# Patient Record
Sex: Male | Born: 1955
Health system: Southern US, Community
[De-identification: ages and names within clinical notes are randomized; demographics above are authoritative.]

## PROBLEM LIST (undated history)

## (undated) DIAGNOSIS — M5126 Other intervertebral disc displacement, lumbar region: Secondary | ICD-10-CM

## (undated) DIAGNOSIS — E119 Type 2 diabetes mellitus without complications: Secondary | ICD-10-CM

## (undated) DIAGNOSIS — K5790 Diverticulosis of intestine, part unspecified, without perforation or abscess without bleeding: Secondary | ICD-10-CM

## (undated) DIAGNOSIS — I10 Essential (primary) hypertension: Secondary | ICD-10-CM

## (undated) DIAGNOSIS — K219 Gastro-esophageal reflux disease without esophagitis: Secondary | ICD-10-CM

## (undated) DIAGNOSIS — M1990 Unspecified osteoarthritis, unspecified site: Secondary | ICD-10-CM

## (undated) DIAGNOSIS — J302 Other seasonal allergic rhinitis: Secondary | ICD-10-CM

## (undated) DIAGNOSIS — Z8639 Personal history of other endocrine, nutritional and metabolic disease: Secondary | ICD-10-CM

## (undated) DIAGNOSIS — E785 Hyperlipidemia, unspecified: Secondary | ICD-10-CM

## (undated) DIAGNOSIS — E039 Hypothyroidism, unspecified: Secondary | ICD-10-CM

## (undated) DIAGNOSIS — H9201 Otalgia, right ear: Secondary | ICD-10-CM

## (undated) DIAGNOSIS — M48061 Spinal stenosis, lumbar region without neurogenic claudication: Secondary | ICD-10-CM

## (undated) DIAGNOSIS — K635 Polyp of colon: Secondary | ICD-10-CM

## (undated) DIAGNOSIS — H6241 Otitis externa in other diseases classified elsewhere, right ear: Secondary | ICD-10-CM

## (undated) DIAGNOSIS — M51369 Other intervertebral disc degeneration, lumbar region without mention of lumbar back pain or lower extremity pain: Secondary | ICD-10-CM

## (undated) DIAGNOSIS — T7840XA Allergy, unspecified, initial encounter: Secondary | ICD-10-CM

## (undated) DIAGNOSIS — M779 Enthesopathy, unspecified: Secondary | ICD-10-CM

## (undated) DIAGNOSIS — I2510 Atherosclerotic heart disease of native coronary artery without angina pectoris: Secondary | ICD-10-CM

## (undated) DIAGNOSIS — M1712 Unilateral primary osteoarthritis, left knee: Secondary | ICD-10-CM

## (undated) DIAGNOSIS — R739 Hyperglycemia, unspecified: Secondary | ICD-10-CM

## (undated) DIAGNOSIS — B369 Superficial mycosis, unspecified: Secondary | ICD-10-CM

## (undated) DIAGNOSIS — I453 Trifascicular block: Secondary | ICD-10-CM

## (undated) DIAGNOSIS — I493 Ventricular premature depolarization: Secondary | ICD-10-CM

## (undated) DIAGNOSIS — R892 Abnormal level of other drugs, medicaments and biological substances in specimens from other organs, systems and tissues: Secondary | ICD-10-CM

## (undated) DIAGNOSIS — K7460 Unspecified cirrhosis of liver: Secondary | ICD-10-CM

## (undated) DIAGNOSIS — G4733 Obstructive sleep apnea (adult) (pediatric): Secondary | ICD-10-CM

## (undated) DIAGNOSIS — K2970 Gastritis, unspecified, without bleeding: Secondary | ICD-10-CM

## (undated) DIAGNOSIS — K5792 Diverticulitis of intestine, part unspecified, without perforation or abscess without bleeding: Secondary | ICD-10-CM

## (undated) DIAGNOSIS — K648 Other hemorrhoids: Secondary | ICD-10-CM

## (undated) DIAGNOSIS — D696 Thrombocytopenia, unspecified: Secondary | ICD-10-CM

## (undated) DIAGNOSIS — R161 Splenomegaly, not elsewhere classified: Secondary | ICD-10-CM

## (undated) DIAGNOSIS — H9319 Tinnitus, unspecified ear: Secondary | ICD-10-CM

## (undated) DIAGNOSIS — F419 Anxiety disorder, unspecified: Secondary | ICD-10-CM

## (undated) DIAGNOSIS — M5136 Other intervertebral disc degeneration, lumbar region: Secondary | ICD-10-CM

## (undated) DIAGNOSIS — Z9884 Bariatric surgery status: Secondary | ICD-10-CM

## (undated) DIAGNOSIS — H903 Sensorineural hearing loss, bilateral: Secondary | ICD-10-CM

## (undated) DIAGNOSIS — K760 Fatty (change of) liver, not elsewhere classified: Secondary | ICD-10-CM

## (undated) DIAGNOSIS — I5032 Chronic diastolic (congestive) heart failure: Secondary | ICD-10-CM

## (undated) HISTORY — DX: Unspecified osteoarthritis, unspecified site: M19.90

## (undated) HISTORY — DX: Abnormal level of other drugs, medicaments and biological substances in specimens from other organs, systems and tissues: R89.2

## (undated) HISTORY — PX: TRACHEOSTOMY: SUR1362

## (undated) HISTORY — PX: TONSILLECTOMY: SUR1361

## (undated) HISTORY — DX: Gastritis, unspecified, without bleeding: K29.70

## (undated) HISTORY — DX: Sensorineural hearing loss, bilateral: H90.3

## (undated) HISTORY — DX: Other seasonal allergic rhinitis: J30.2

## (undated) HISTORY — DX: Spinal stenosis, lumbar region without neurogenic claudication: M48.061

## (undated) HISTORY — DX: Other intervertebral disc degeneration, lumbar region: M51.36

## (undated) HISTORY — DX: Hypothyroidism, unspecified: E03.9

## (undated) HISTORY — DX: Unspecified cirrhosis of liver: K74.60

## (undated) HISTORY — DX: Diverticulosis of intestine, part unspecified, without perforation or abscess without bleeding: K57.90

## (undated) HISTORY — PX: TRACHEOSTOMY CLOSURE: SHX458

## (undated) HISTORY — DX: Hyperlipidemia, unspecified: E78.5

## (undated) HISTORY — DX: Enthesopathy, unspecified: M77.9

## (undated) HISTORY — DX: Tinnitus, unspecified ear: H93.19

## (undated) HISTORY — DX: Other hemorrhoids: K64.8

## (undated) HISTORY — DX: Anxiety disorder, unspecified: F41.9

## (undated) HISTORY — DX: Obstructive sleep apnea (adult) (pediatric): G47.33

## (undated) HISTORY — DX: Fatty (change of) liver, not elsewhere classified: K76.0

## (undated) HISTORY — DX: Otalgia, right ear: H92.01

## (undated) HISTORY — DX: Morbid (severe) obesity due to excess calories: E66.01

## (undated) HISTORY — DX: Otitis externa in other diseases classified elsewhere, right ear: H62.41

## (undated) HISTORY — DX: Chronic diastolic (congestive) heart failure: I50.32

## (undated) HISTORY — DX: Bariatric surgery status: Z98.84

## (undated) HISTORY — DX: Allergy, unspecified, initial encounter: T78.40XA

## (undated) HISTORY — DX: Other intervertebral disc degeneration, lumbar region without mention of lumbar back pain or lower extremity pain: M51.369

## (undated) HISTORY — DX: Personal history of other endocrine, nutritional and metabolic disease: Z86.39

## (undated) HISTORY — DX: Splenomegaly, not elsewhere classified: R16.1

## (undated) HISTORY — PX: EYE SURGERY: SHX253

## (undated) HISTORY — DX: Polyp of colon: K63.5

## (undated) HISTORY — DX: Diverticulitis of intestine, part unspecified, without perforation or abscess without bleeding: K57.92

## (undated) HISTORY — DX: Gastro-esophageal reflux disease without esophagitis: K21.9

## (undated) HISTORY — DX: Essential (primary) hypertension: I10

## (undated) HISTORY — DX: Other intervertebral disc displacement, lumbar region: M51.26

## (undated) HISTORY — DX: Superficial mycosis, unspecified: B36.9

---

## 1983-02-22 DIAGNOSIS — Z9884 Bariatric surgery status: Secondary | ICD-10-CM

## 1983-02-22 HISTORY — DX: Bariatric surgery status: Z98.84

## 1983-02-22 HISTORY — PX: ABDOMINAL SURGERY: SHX537

## 1983-02-22 HISTORY — PX: OTHER SURGICAL HISTORY: SHX169

## 1997-07-21 ENCOUNTER — Emergency Department (HOSPITAL_COMMUNITY): Admission: EM | Admit: 1997-07-21 | Discharge: 1997-07-21 | Payer: Self-pay | Admitting: Emergency Medicine

## 1997-08-01 ENCOUNTER — Emergency Department (HOSPITAL_COMMUNITY): Admission: EM | Admit: 1997-08-01 | Discharge: 1997-08-01 | Payer: Self-pay | Admitting: Emergency Medicine

## 1999-04-01 ENCOUNTER — Encounter: Admission: RE | Admit: 1999-04-01 | Discharge: 1999-04-01 | Payer: Self-pay | Admitting: Internal Medicine

## 1999-04-20 ENCOUNTER — Encounter: Admission: RE | Admit: 1999-04-20 | Discharge: 1999-04-20 | Payer: Self-pay | Admitting: Hematology and Oncology

## 1999-09-23 ENCOUNTER — Emergency Department (HOSPITAL_COMMUNITY): Admission: EM | Admit: 1999-09-23 | Discharge: 1999-09-23 | Payer: Self-pay | Admitting: Emergency Medicine

## 2001-08-10 ENCOUNTER — Encounter: Payer: Self-pay | Admitting: Emergency Medicine

## 2001-08-10 ENCOUNTER — Emergency Department (HOSPITAL_COMMUNITY): Admission: EM | Admit: 2001-08-10 | Discharge: 2001-08-10 | Payer: Self-pay | Admitting: Emergency Medicine

## 2003-02-22 HISTORY — PX: CHOLECYSTECTOMY: SHX55

## 2005-04-03 ENCOUNTER — Emergency Department (HOSPITAL_COMMUNITY): Admission: EM | Admit: 2005-04-03 | Discharge: 2005-04-03 | Payer: Self-pay | Admitting: Emergency Medicine

## 2006-02-21 DIAGNOSIS — K635 Polyp of colon: Secondary | ICD-10-CM

## 2006-02-21 HISTORY — DX: Polyp of colon: K63.5

## 2006-03-13 ENCOUNTER — Encounter (INDEPENDENT_AMBULATORY_CARE_PROVIDER_SITE_OTHER): Payer: Self-pay | Admitting: *Deleted

## 2006-03-13 ENCOUNTER — Emergency Department (HOSPITAL_COMMUNITY): Admission: EM | Admit: 2006-03-13 | Discharge: 2006-03-13 | Payer: Self-pay | Admitting: Emergency Medicine

## 2006-10-23 HISTORY — PX: COLONOSCOPY: SHX174

## 2007-03-10 ENCOUNTER — Emergency Department (HOSPITAL_COMMUNITY): Admission: EM | Admit: 2007-03-10 | Discharge: 2007-03-11 | Payer: Self-pay | Admitting: Emergency Medicine

## 2007-10-08 ENCOUNTER — Ambulatory Visit: Payer: Self-pay

## 2009-04-23 ENCOUNTER — Emergency Department: Payer: Self-pay | Admitting: Emergency Medicine

## 2009-06-04 ENCOUNTER — Emergency Department (HOSPITAL_COMMUNITY): Admission: EM | Admit: 2009-06-04 | Discharge: 2009-06-04 | Payer: Self-pay | Admitting: Emergency Medicine

## 2010-04-22 HISTORY — PX: CARDIAC CATHETERIZATION: SHX172

## 2010-05-03 ENCOUNTER — Emergency Department (HOSPITAL_COMMUNITY): Payer: Medicare Other

## 2010-05-03 ENCOUNTER — Inpatient Hospital Stay (HOSPITAL_COMMUNITY)
Admission: EM | Admit: 2010-05-03 | Discharge: 2010-05-05 | DRG: 392 | Disposition: A | Discharge status: Home / Self Care | Payer: Medicare Other | Attending: Internal Medicine | Admitting: Internal Medicine

## 2010-05-03 DIAGNOSIS — G609 Hereditary and idiopathic neuropathy, unspecified: Secondary | ICD-10-CM | POA: Diagnosis present

## 2010-05-03 DIAGNOSIS — E119 Type 2 diabetes mellitus without complications: Secondary | ICD-10-CM | POA: Diagnosis present

## 2010-05-03 DIAGNOSIS — I2510 Atherosclerotic heart disease of native coronary artery without angina pectoris: Secondary | ICD-10-CM | POA: Diagnosis present

## 2010-05-03 DIAGNOSIS — G589 Mononeuropathy, unspecified: Secondary | ICD-10-CM | POA: Diagnosis present

## 2010-05-03 DIAGNOSIS — E785 Hyperlipidemia, unspecified: Secondary | ICD-10-CM | POA: Diagnosis present

## 2010-05-03 DIAGNOSIS — R079 Chest pain, unspecified: Secondary | ICD-10-CM

## 2010-05-03 DIAGNOSIS — R0602 Shortness of breath: Secondary | ICD-10-CM | POA: Diagnosis present

## 2010-05-03 DIAGNOSIS — E039 Hypothyroidism, unspecified: Secondary | ICD-10-CM | POA: Diagnosis present

## 2010-05-03 DIAGNOSIS — I10 Essential (primary) hypertension: Secondary | ICD-10-CM | POA: Diagnosis present

## 2010-05-03 DIAGNOSIS — I4510 Unspecified right bundle-branch block: Secondary | ICD-10-CM | POA: Diagnosis present

## 2010-05-03 DIAGNOSIS — K5900 Constipation, unspecified: Secondary | ICD-10-CM | POA: Diagnosis present

## 2010-05-03 DIAGNOSIS — Z7982 Long term (current) use of aspirin: Secondary | ICD-10-CM

## 2010-05-03 DIAGNOSIS — K3184 Gastroparesis: Secondary | ICD-10-CM | POA: Diagnosis present

## 2010-05-03 DIAGNOSIS — Z9884 Bariatric surgery status: Secondary | ICD-10-CM

## 2010-05-03 DIAGNOSIS — M1990 Unspecified osteoarthritis, unspecified site: Secondary | ICD-10-CM | POA: Diagnosis present

## 2010-05-03 DIAGNOSIS — K219 Gastro-esophageal reflux disease without esophagitis: Principal | ICD-10-CM | POA: Diagnosis present

## 2010-05-03 LAB — POCT CARDIAC MARKERS
CKMB, poc: <1 ng/mL — ABNORMAL LOW (ref 1.0–8.0)
Myoglobin, poc: 90.6 ng/mL (ref 12–200)
Troponin i, poc: <0.05 ng/mL (ref 0.00–0.09)

## 2010-05-03 LAB — CARDIAC PANEL(CRET KIN+CKTOT+MB+TROPI)
CK, MB: 1.1 ng/mL (ref 0.3–4.0)
Troponin I: 0.02 ng/mL (ref 0.00–0.06)

## 2010-05-03 LAB — DIFFERENTIAL
Eosinophils Relative: 2 % (ref 0–5)
Lymphocytes Relative: 11 % — ABNORMAL LOW (ref 12–46)
Lymphs Abs: 0.6 10*3/uL — ABNORMAL LOW (ref 0.7–4.0)
Monocytes Absolute: 0.5 10*3/uL (ref 0.1–1.0)
Monocytes Relative: 9 % (ref 3–12)

## 2010-05-03 LAB — POCT I-STAT, CHEM 8
BUN: 15 mg/dL (ref 6–23)
Calcium, Ion: 1.08 mmol/L — ABNORMAL LOW (ref 1.12–1.32)
Chloride: 97 mEq/L (ref 96–112)
Glucose, Bld: 354 mg/dL — ABNORMAL HIGH (ref 70–99)
HCT: 45 % (ref 39.0–52.0)

## 2010-05-03 LAB — URINALYSIS, ROUTINE W REFLEX MICROSCOPIC
Leukocytes, UA: NEGATIVE
Protein, ur: NEGATIVE mg/dL
Urobilinogen, UA: 1 mg/dL (ref 0.0–1.0)

## 2010-05-03 LAB — TROPONIN I: Troponin I: 0.05 ng/mL (ref 0.00–0.06)

## 2010-05-03 LAB — URINE MICROSCOPIC-ADD ON

## 2010-05-03 LAB — BASIC METABOLIC PANEL
CO2: 28 mEq/L (ref 19–32)
Chloride: 95 mEq/L — ABNORMAL LOW (ref 96–112)
GFR calc Af Amer: >60 mL/min (ref >60)
Sodium: 132 mEq/L — ABNORMAL LOW (ref 135–145)

## 2010-05-03 LAB — LIPID PANEL
HDL: 37 mg/dL — ABNORMAL LOW (ref >39)
Triglycerides: 224 mg/dL — ABNORMAL HIGH (ref <150)
VLDL: 45 mg/dL — ABNORMAL HIGH (ref 0–40)

## 2010-05-03 LAB — CBC
HCT: 42.1 % (ref 39.0–52.0)
MCV: 85.2 fL (ref 78.0–100.0)
RDW: 13.2 % (ref 11.5–15.5)
WBC: 5.7 10*3/uL (ref 4.0–10.5)

## 2010-05-04 DIAGNOSIS — I2510 Atherosclerotic heart disease of native coronary artery without angina pectoris: Secondary | ICD-10-CM

## 2010-05-04 LAB — HEMOGLOBIN A1C: Hgb A1c MFr Bld: 8.6 % — ABNORMAL HIGH (ref <5.7)

## 2010-05-04 LAB — CARDIAC PANEL(CRET KIN+CKTOT+MB+TROPI)
Relative Index: INVALID (ref 0.0–2.5)
Troponin I: 0.02 ng/mL (ref 0.00–0.06)

## 2010-05-04 LAB — GLUCOSE, CAPILLARY: Glucose-Capillary: 134 mg/dL — ABNORMAL HIGH (ref 70–99)

## 2010-05-05 LAB — COMPREHENSIVE METABOLIC PANEL
AST: 42 U/L — ABNORMAL HIGH (ref 0–37)
Albumin: 3.1 g/dL — ABNORMAL LOW (ref 3.5–5.2)
Chloride: 103 mEq/L (ref 96–112)
Creatinine, Ser: 0.86 mg/dL (ref 0.4–1.5)
GFR calc Af Amer: >60 mL/min (ref >60)
Potassium: 3.4 mEq/L — ABNORMAL LOW (ref 3.5–5.1)
Sodium: 136 mEq/L (ref 135–145)
Total Bilirubin: 1.4 mg/dL — ABNORMAL HIGH (ref 0.3–1.2)

## 2010-05-05 LAB — CBC
HCT: 37.8 % — ABNORMAL LOW (ref 39.0–52.0)
RDW: 13.6 % (ref 11.5–15.5)
WBC: 4.9 10*3/uL (ref 4.0–10.5)

## 2010-05-06 LAB — GLUCOSE, CAPILLARY: Glucose-Capillary: 274 mg/dL — ABNORMAL HIGH (ref 70–99)

## 2010-05-12 LAB — GLUCOSE, CAPILLARY: Glucose-Capillary: 163 mg/dL — ABNORMAL HIGH (ref 70–99)

## 2010-06-03 NOTE — Procedures (Signed)
[name], [name]                ACCOUNT NO.:  [id number]  MEDICAL RECORD NO.:  [id number]           PATIENT TYPE:  I  LOCATION:  2807                         FACILITY:  [facility]  PHYSICIAN:  Chad Brasil. Lia Foyer, MD, FACCDATE OF BIRTH:  [birth year]  DATE OF PROCEDURE:  05/04/2010 DATE OF DISCHARGE:                           CARDIAC CATHETERIZATION   PROCEDURE:  Cardiac catheterization.  OPERATOR:  Chad Brasil. Lia Foyer, MD, [facility]  INDICATIONS:  Chad [name] is a 55 year old gentleman who has had gastric surgery on several occasions.  He is morbidly obese.  He has had pretty severe chest pain.  He was brought to the catheterization laboratory for further evaluation.  The patient does have an elevated hemoglobin A1c. Risks and benefits were discussed with the patient.  He consented to proceed.  DESCRIPTION OF PROCEDURE:  The procedure was performed from the right radial artery.  The anterior puncture was achieved and a 5-French sheath was placed.  Intra-arterial verapamil 3 mg and 6000 units of intravenous heparin were administered.  Views of the right and left coronaries were obtained in multiple angiographic projections.  Central aortic and left ventricular pressures were measured with pigtail.  Ventriculography was performed in the RAO projection.  There were no major complications. The patient tolerated the procedure well.  A TR band was placed and he was taken to the holding area.  I reviewed the films with his mother, aunt, and with his wife.  HEMODYNAMIC DATA: 1. The central aortic pressure initially was 87/60, mean 71. 2. LV pressure 104/12. 3. No gradient or pullback across the aortic valve.  ANGIOGRAPHIC DATA: 1. On plain fluoroscopy, there was moderate calcification,     particularly of the proximal LAD, left main bifurcation. 2. The left main is a large-caliber vessel that is free of critical     disease. 3. The LAD comes off proximally.  There is some diffuse luminal  irregularity near the takeoff of the first diagonal.  There is     about 30% narrowing overlapping the first takeoff.  There is a     secondary lesion of about 40 to clearly no more than 50% noted in     the LAD in the midportion.  The apical portion wraps the apex.  The     diagonal is a moderate-sized vessel that is free of critical     disease. 4. The circumflex comes off proximally.  There is diffuse segmental     plaque of the ostium of the circumflex which then trifurcates into     a small intermediate branch, a moderate-sized intermediate branch,     and an AV circumflex.  The two superior branches are without     critical narrowing.  The AV branch goes down and supplies part of     the posterolateral segment, and there is about 30-40% narrowing in     its takeoff.  None of this appears to be critical. 5. The right coronary artery is a moderate-sized vessel.  The right is     smooth throughout down to the PDA.  There is some mild proximal  plaquing in the PDA and then the AV portion goes posteriorly, it is     small and tapered in caliber, supplying a very small posterolateral     branch.  CONCLUSIONS: 1. Well-preserved overall left ventricular function. 2. Moderate calcification of the left anterior descending. 3. Scattered luminal irregularities of moderate severity that do not     appear to be tight.  DISPOSITION:  The patient clearly needs to lose weight.  This has been discussed.  Fortunately, he is not a smoker.  Importantly, I doubt that the angiogram explains the patient's chest pain.  Other sources will be sought.  I have spoken with the General Medicine Team.  They may evaluate his gallbladder and his GI situation.  His D-dimer is known to be normal.     Chad Brasil. Lia Foyer, MD, [facility]     TDS/MEDQ  D:  05/04/2010  T:  05/05/2010  Job:  [id number]  cc:   CV Laboratory Chad Bors. Stanford Breed, MD, [facility]  Electronically Signed by Chad Quarry MD [facility] on 06/03/2010  05:38:19 AM
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
## 2010-08-04 LAB — COMPREHENSIVE METABOLIC PANEL: Potassium: 4.4 mmol/L

## 2010-08-04 LAB — LIPID PANEL
Cholesterol, Total: 157
Direct LDL: 97
HDL: 33 mg/dL — AB (ref 35–70)

## 2010-08-04 LAB — CBC
Hemoglobin: 14 g/dL (ref 13.5–17.5)
MCV: 88 fL
WBC: 7.1
platelet count: 196

## 2010-08-04 LAB — TSH: TSH: 2.23

## 2010-11-11 LAB — URINALYSIS, ROUTINE W REFLEX MICROSCOPIC
Glucose, UA: NEGATIVE
Hgb urine dipstick: NEGATIVE
Ketones, ur: NEGATIVE
Protein, ur: NEGATIVE

## 2010-11-11 LAB — CBC
MCHC: 34.1
RBC: 4.95
RDW: 12.9

## 2010-11-11 LAB — COMPREHENSIVE METABOLIC PANEL
ALT: 44
AST: 49 — ABNORMAL HIGH
Calcium: 9.5
GFR calc Af Amer: >60
Sodium: 133 — ABNORMAL LOW
Total Protein: 6.6

## 2010-11-11 LAB — LIPASE, BLOOD: Lipase: 27

## 2010-11-11 LAB — DIFFERENTIAL
Eosinophils Absolute: 0.2
Eosinophils Relative: 3
Lymphs Abs: 1.4
Monocytes Relative: 8

## 2010-12-23 HISTORY — PX: US ECHOCARDIOGRAPHY: HXRAD669

## 2010-12-28 ENCOUNTER — Encounter: Payer: Self-pay | Admitting: Family Medicine

## 2010-12-28 ENCOUNTER — Ambulatory Visit (INDEPENDENT_AMBULATORY_CARE_PROVIDER_SITE_OTHER): Payer: Medicare Other | Admitting: Family Medicine

## 2010-12-28 VITALS — BP 132/80 | HR 72 | Temp 97.9°F | Ht 69.0 in | Wt 353.4 lb

## 2010-12-28 DIAGNOSIS — E11319 Type 2 diabetes mellitus with unspecified diabetic retinopathy without macular edema: Secondary | ICD-10-CM | POA: Insufficient documentation

## 2010-12-28 DIAGNOSIS — R0600 Dyspnea, unspecified: Secondary | ICD-10-CM

## 2010-12-28 DIAGNOSIS — R0609 Other forms of dyspnea: Secondary | ICD-10-CM

## 2010-12-28 DIAGNOSIS — R079 Chest pain, unspecified: Secondary | ICD-10-CM

## 2010-12-28 DIAGNOSIS — J302 Other seasonal allergic rhinitis: Secondary | ICD-10-CM | POA: Insufficient documentation

## 2010-12-28 DIAGNOSIS — D126 Benign neoplasm of colon, unspecified: Secondary | ICD-10-CM

## 2010-12-28 DIAGNOSIS — K219 Gastro-esophageal reflux disease without esophagitis: Secondary | ICD-10-CM | POA: Insufficient documentation

## 2010-12-28 DIAGNOSIS — Z23 Encounter for immunization: Secondary | ICD-10-CM

## 2010-12-28 DIAGNOSIS — I499 Cardiac arrhythmia, unspecified: Secondary | ICD-10-CM | POA: Insufficient documentation

## 2010-12-28 DIAGNOSIS — E1169 Type 2 diabetes mellitus with other specified complication: Secondary | ICD-10-CM | POA: Insufficient documentation

## 2010-12-28 DIAGNOSIS — E785 Hyperlipidemia, unspecified: Secondary | ICD-10-CM

## 2010-12-28 DIAGNOSIS — E039 Hypothyroidism, unspecified: Secondary | ICD-10-CM | POA: Insufficient documentation

## 2010-12-28 DIAGNOSIS — R0989 Other specified symptoms and signs involving the circulatory and respiratory systems: Secondary | ICD-10-CM

## 2010-12-28 DIAGNOSIS — R0602 Shortness of breath: Secondary | ICD-10-CM | POA: Insufficient documentation

## 2010-12-28 DIAGNOSIS — E119 Type 2 diabetes mellitus without complications: Secondary | ICD-10-CM

## 2010-12-28 DIAGNOSIS — K635 Polyp of colon: Secondary | ICD-10-CM | POA: Insufficient documentation

## 2010-12-28 DIAGNOSIS — I10 Essential (primary) hypertension: Secondary | ICD-10-CM | POA: Insufficient documentation

## 2010-12-28 DIAGNOSIS — J309 Allergic rhinitis, unspecified: Secondary | ICD-10-CM

## 2010-12-28 NOTE — Patient Instructions (Addendum)
Call [place] or [place] to find out about last tetanus shot, and let me know. Flu shot today. We will request records from [facility].  We will also try and obtain copy of colonoscopy. Pass by [place]'s office today for referral to heart doctor and ultrasound for chest pains, irregular heart beat, and shortness of breath.  If chest pain comes on again, or worsening pressure/tightness, please seek urgent medical care. Good to meet you today, call us with questions.

## 2010-12-28 NOTE — Assessment & Plan Note (Signed)
Check TSH today

## 2010-12-28 NOTE — Assessment & Plan Note (Signed)
EKG today - sinus with ventricular bigeminy.  RBBB - present back in 04/2010.  1st degree AV block - present back in 04/2010.  RAD. Bigeminy is new, RBBB and AV block not new (present since last EKG 04/2010) Will refer to cards for assistance in management. Not decompensated currently.

## 2010-12-28 NOTE — Assessment & Plan Note (Addendum)
Mostly atypical however does have risk factors - fmhx, morbid obesity, DM, HTN, HLD, known CAD albeit mild to moderate. Refer to cards for further evaluation.  A total of 60 minutes were spent face-to-face with the patient during this encounter and over half of that time was spent on counseling and coordination of care

## 2010-12-28 NOTE — Assessment & Plan Note (Signed)
Endorsing PND and orthopnea but no evidence of fluid overload on exam. May have component of cor pulmonale. Start with echo, BNP today.  If abnormal, may need referral to pulm down road in setting of OSA. Will start with cards referral.

## 2010-12-28 NOTE — Assessment & Plan Note (Signed)
Chronic. Check A1c, microalbumin today. Requested records from prior PCP. May need to back off Actos - see above.
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
## 2010-12-28 NOTE — Assessment & Plan Note (Signed)
Chronic. Stable on current meds.  No changes.   May need to come off B blocker. Requested records from prior PCP

## 2010-12-28 NOTE — Assessment & Plan Note (Signed)
Zyrtec too sedating. Trial of claritin.

## 2010-12-29 LAB — LIPID PANEL
HDL: 43.7 mg/dL (ref >39.00)
Total CHOL/HDL Ratio: 4
VLDL: 19 mg/dL (ref 0.0–40.0)

## 2010-12-29 LAB — COMPREHENSIVE METABOLIC PANEL
Alkaline Phosphatase: 97 U/L (ref 39–117)
Creatinine, Ser: 0.7 mg/dL (ref 0.4–1.5)
Glucose, Bld: 113 mg/dL — ABNORMAL HIGH (ref 70–99)
Sodium: 137 mEq/L (ref 135–145)
Total Bilirubin: 1.1 mg/dL (ref 0.3–1.2)
Total Protein: 6.9 g/dL (ref 6.0–8.3)

## 2010-12-29 LAB — MICROALBUMIN / CREATININE URINE RATIO
Creatinine,U: 31.2 mg/dL
Microalb, Ur: 0.1 mg/dL (ref 0.0–1.9)

## 2010-12-29 LAB — TSH: TSH: 1.86 u[IU]/mL (ref 0.35–5.50)

## 2010-12-29 LAB — BRAIN NATRIURETIC PEPTIDE: Pro B Natriuretic peptide (BNP): 10 pg/mL (ref 0.0–100.0)

## 2011-01-11 ENCOUNTER — Other Ambulatory Visit: Payer: Self-pay | Admitting: Cardiology

## 2011-01-11 DIAGNOSIS — R0602 Shortness of breath: Secondary | ICD-10-CM

## 2011-01-11 DIAGNOSIS — I509 Heart failure, unspecified: Secondary | ICD-10-CM

## 2011-01-14 ENCOUNTER — Ambulatory Visit (INDEPENDENT_AMBULATORY_CARE_PROVIDER_SITE_OTHER): Payer: Medicare Other | Admitting: Cardiovascular Disease

## 2011-01-14 ENCOUNTER — Encounter: Payer: Self-pay | Admitting: Cardiovascular Disease

## 2011-01-14 ENCOUNTER — Other Ambulatory Visit (INDEPENDENT_AMBULATORY_CARE_PROVIDER_SITE_OTHER): Payer: Medicare Other | Admitting: *Deleted

## 2011-01-14 DIAGNOSIS — I10 Essential (primary) hypertension: Secondary | ICD-10-CM

## 2011-01-14 DIAGNOSIS — I509 Heart failure, unspecified: Secondary | ICD-10-CM

## 2011-01-14 DIAGNOSIS — R0602 Shortness of breath: Secondary | ICD-10-CM

## 2011-01-14 DIAGNOSIS — G4733 Obstructive sleep apnea (adult) (pediatric): Secondary | ICD-10-CM

## 2011-01-14 DIAGNOSIS — I2510 Atherosclerotic heart disease of native coronary artery without angina pectoris: Secondary | ICD-10-CM

## 2011-01-14 DIAGNOSIS — E669 Obesity, unspecified: Secondary | ICD-10-CM

## 2011-01-14 DIAGNOSIS — I499 Cardiac arrhythmia, unspecified: Secondary | ICD-10-CM

## 2011-01-14 DIAGNOSIS — E785 Hyperlipidemia, unspecified: Secondary | ICD-10-CM

## 2011-01-14 NOTE — Assessment & Plan Note (Signed)
History of gastric bypass, still with obesity. He reports 30 pound weight loss since the beginning of the year. We have encouraged continued exercise, careful diet management in an effort to lose weight.

## 2011-01-14 NOTE — Assessment & Plan Note (Signed)
We have suggested he try red yeast rice one pill for the first month, titrating to 2 pills a day if no symptoms of cramping.

## 2011-01-14 NOTE — Patient Instructions (Signed)
You are doing well. No medication changes were made.  Please try RED YEAST RICE, one a day for one month, then up to two a day (maximum 4 a day)  Please call us if you have new issues that need to be addressed before your next appt.  Follow up in one year

## 2011-01-14 NOTE — Progress Notes (Signed)
Patient ID: Chad [name], male    DOB: 10/17/1955, 55 y.o.   MRN: [id number]  HPI Comments: Chad [name] is a very pleasant 55 year old gentleman with a history of morbid obesity, history of gastric bypass, obstructive sleep apnea who does not wear CPAP, history of throat surgery for sleep apnea though the details are unavailable, history of GERD who sleeps on a wedge who presents by referral for abnormal EKG. He had a cardiac catheterization in March of 2012 showing mild disease, moderate calcifications with no intervention needed.  He reports that he feels well, no significant shortness of breath. He denies any chest pain. He is having problems with his sleep and wakes on a nightly basis with shortness of breath. It takes him several minutes for him to catch his breath and often he has to cough. He denies any burning in his chest when he wakes up, no nausea or vomiting, nothing in the back of his throat.   In the past, he was not able to tolerate CPAP and returned the machine many years ago. He is not interested in having any repeat testing.  He does have occasional palpitations but overall is relatively asymptomatic. He has tried custom medications in the past including Lipitor and he reports it has caused profound cramping in his legs to the point where he has gone to the hospital 3 times.  Previous EKG performed on November 6 shows right bundle branch block, normal sinus rhythm with bigeminy/PVCs, no other significant ST or T wave changes  Outpatient Encounter Prescriptions as of 01/14/2011  Medication Sig Dispense Refill  . aspirin EC 81 MG tablet Take 162 mg by mouth daily.        Marland Kitchen esomeprazole (NEXIUM) 40 MG capsule Take 40 mg by mouth 2 (two) times daily.        Marland Kitchen glipiZIDE (GLUCOTROL) 10 MG tablet Take 10 mg by mouth 2 (two) times daily before a meal.        . hydrochlorothiazide (HYDRODIURIL) 25 MG tablet Take 25 mg by mouth daily.        Marland Kitchen HYDROcodone-acetaminophen (VICODIN) 5-500 MG  per tablet Take 1 tablet by mouth 2 (two) times daily as needed.        Marland Kitchen ibuprofen (ADVIL,MOTRIN) 600 MG tablet Take 600 mg by mouth every 6 (six) hours as needed.        Marland Kitchen levothyroxine (SYNTHROID, LEVOTHROID) 100 MCG tablet Take 100 mcg by mouth daily.        Marland Kitchen lisinopril (PRINIVIL,ZESTRIL) 40 MG tablet Take 40 mg by mouth daily.        . metFORMIN (GLUCOPHAGE) 1000 MG tablet Take 1,000 mg by mouth. 1 in the AM and 1.5 at night      . metoCLOPramide (REGLAN) 10 MG tablet Take 10 mg by mouth 4 (four) times daily.        . metoprolol tartrate (LOPRESSOR) 25 MG tablet Take 25 mg by mouth 2 (two) times daily.        Marland Kitchen omega-3 acid ethyl esters (LOVAZA) 1 G capsule Take 4 g by mouth 2 (two) times daily.        . pioglitazone (ACTOS) 30 MG tablet Take 30 mg by mouth daily.           Review of Systems  Constitutional: Negative.   HENT: Negative.   Eyes: Negative.   Respiratory: Positive for shortness of breath.   Cardiovascular: Negative.   Gastrointestinal: Negative.   Musculoskeletal: Positive for  arthralgias.  Skin: Negative.   Neurological: Negative.   Hematological: Negative.   Psychiatric/Behavioral: Negative.   All other systems reviewed and are negative.    BP 120/64  Pulse 76  Ht 5' 9"  (1.753 m)  Wt 351 lb 6.4 oz (159.394 kg)  BMI 51.89 kg/m2  Physical Exam  Nursing note and vitals reviewed. Constitutional: He is oriented to person, place, and time. He appears well-developed and well-nourished.       obese  HENT:  Head: Normocephalic.  Nose: Nose normal.  Mouth/Throat: Oropharynx is clear and moist.  Eyes: Conjunctivae are normal. Pupils are equal, round, and reactive to light.  Neck: Normal range of motion. Neck supple. No JVD present.  Cardiovascular: Normal rate, regular rhythm, S1 normal, S2 normal, normal heart sounds and intact distal pulses.  Exam reveals no gallop and no friction rub.   No murmur heard. Pulmonary/Chest: Effort normal and breath sounds  normal. No respiratory distress. He has no wheezes. He has no rales. He exhibits no tenderness.  Abdominal: Soft. Bowel sounds are normal. He exhibits no distension. There is no tenderness.  Musculoskeletal: Normal range of motion. He exhibits no edema and no tenderness.  Lymphadenopathy:    He has no cervical adenopathy.  Neurological: He is alert and oriented to person, place, and time. Coordination normal.  Skin: Skin is warm and dry. No rash noted. No erythema.  Psychiatric: He has a normal mood and affect. His behavior is normal. Judgment and thought content normal.           Assessment and Plan

## 2011-01-14 NOTE — Assessment & Plan Note (Signed)
Blood pressure is well controlled on today's visit. No changes made to the medications. 

## 2011-01-14 NOTE — Assessment & Plan Note (Signed)
He reports occasional palpitations. Previous EKG shows normal sinus rhythm with bigeminal pattern, frequent PVCs. This is likely a benign finding and does not need additional workup at this time. Pulmonary echocardiogram is essentially normal with normal LV function. Final report is pending.  PVCs could be secondary to periods of hypoxia from sleep apnea, poor sleep or any of a number of reasons.

## 2011-01-14 NOTE — Assessment & Plan Note (Signed)
History of obstructive sleep apnea, sleeps on a wedge. He turned his CPAP back in several years ago. He does wake every night short of breath and choking and coughs to start breathing again. He is not interested in repeat testing. We did mention that he can do a home sleep study  if he would like to.

## 2011-01-14 NOTE — Assessment & Plan Note (Signed)
Coronary artery disease seen on cardiac catheterization earlier this year in 2012. Suggested we continue aggressive diabetes and cholesterol control, weight loss.

## 2011-01-16 ENCOUNTER — Encounter: Payer: Self-pay | Admitting: Family Medicine

## 2011-01-24 ENCOUNTER — Telehealth: Payer: Self-pay | Admitting: *Deleted

## 2011-01-24 NOTE — Telephone Encounter (Signed)
Filled and placed in Kim's box.  Please fax copy of last OV as well.

## 2011-01-24 NOTE — Telephone Encounter (Signed)
Received paperwork from pharmacy to complete for diabetic shoes and inserts. Paperwork is in your in box for completion.

## 2011-01-26 NOTE — Telephone Encounter (Signed)
Notified [facility] pharmacy representative and paperwork picked up.

## 2011-01-27 ENCOUNTER — Ambulatory Visit: Payer: Medicare Other | Admitting: Family Medicine

## 2011-01-31 ENCOUNTER — Ambulatory Visit (INDEPENDENT_AMBULATORY_CARE_PROVIDER_SITE_OTHER): Payer: Medicare Other | Admitting: Family Medicine

## 2011-01-31 ENCOUNTER — Encounter: Payer: Self-pay | Admitting: Family Medicine

## 2011-01-31 DIAGNOSIS — K219 Gastro-esophageal reflux disease without esophagitis: Secondary | ICD-10-CM

## 2011-01-31 DIAGNOSIS — E785 Hyperlipidemia, unspecified: Secondary | ICD-10-CM

## 2011-01-31 DIAGNOSIS — M1990 Unspecified osteoarthritis, unspecified site: Secondary | ICD-10-CM | POA: Insufficient documentation

## 2011-01-31 DIAGNOSIS — Z23 Encounter for immunization: Secondary | ICD-10-CM

## 2011-01-31 DIAGNOSIS — E119 Type 2 diabetes mellitus without complications: Secondary | ICD-10-CM

## 2011-01-31 DIAGNOSIS — I10 Essential (primary) hypertension: Secondary | ICD-10-CM

## 2011-01-31 DIAGNOSIS — M129 Arthropathy, unspecified: Secondary | ICD-10-CM

## 2011-01-31 MED ORDER — GLIPIZIDE 10 MG PO TABS: ORAL_TABLET | ORAL | Status: DC

## 2011-01-31 NOTE — Assessment & Plan Note (Signed)
Trial of glucosamine. If not improved, return for further eval.

## 2011-01-31 NOTE — Assessment & Plan Note (Signed)
Hopeful for improvement with weight loss. Slowly titrate off reglan, consider backing off nexium as well.

## 2011-01-31 NOTE — Patient Instructions (Addendum)
Try to back off reglan - go down to three times a day with meals (hold PM dose) and see if you do well with this, if so, may back down to 2 pills a day, etc. Consider trying glucosamine for joints as well. Decrease glipizide to 1/2 pill in am and 1 pill in pm.  (change) Td today (tetanus). Return at your convenience for medicare annual wellness visit.

## 2011-01-31 NOTE — Assessment & Plan Note (Signed)
Stable. Well controlled.  Continue meds.

## 2011-01-31 NOTE — Progress Notes (Signed)
  Subjective:    Patient ID: Chad [name], male    DOB: [birth year], 55 y.o.   MRN: [id number]  HPI CC: 1 mo f/u  Seen 1 mo ago as new pt, referred to cards for abnl EKG, rec aggressive medical management of CAD, control of HTN, HLD, DM.  rec start red yeast rice as well.  DM - reports compliance with meds.  Brings log.  Fasting sugars 97 -154.  Occasional lows - has had symptomatic lows x2-3 times in last few months.  Last vision screen 12/2010, told has left cataract.  Occasional tingling/numbness Lab Results  Component Value Date   HGBA1C 5.6 12/28/2010   HTN- bp well controlled on current meds.  Reports compliance.  No HA, vision changes, CP/tightness, SOB, leg swelling.. BP Readings from Last 3 Encounters:  01/31/11 122/70  01/14/11 120/64  12/28/10 132/80   HLD - started red yeast rice per cards recs 1 pill/day, will try and increase to 2 pills/day  Arthritis acting up - hip pain, knee pain on left where fell on knee when working.  Obesity - stable.   Wt Readings from Last 3 Encounters:  01/31/11 352 lb 4 oz (159.78 kg)  01/14/11 351 lb 6.4 oz (159.394 kg)  12/28/10 353 lb 6.4 oz (160.301 kg)   States had PNA shot 2-3 years ago at [facility]. Flu shot last visit. Td today.  Review of Systems Per HPI    Objective:   Physical Exam  Nursing note and vitals reviewed. Constitutional: He appears well-developed and well-nourished. No distress.  HENT:  Head: Normocephalic and atraumatic.  Right Ear: External ear normal.  Left Ear: External ear normal.  Nose: Nose normal.  Mouth/Throat: Oropharynx is clear and moist. No oropharyngeal exudate.  Eyes: Conjunctivae and EOM are normal. Pupils are equal, round, and reactive to light. No scleral icterus.  Neck: Normal range of motion. Neck supple.  Cardiovascular: Normal rate, regular rhythm, normal heart sounds and intact distal pulses.   No murmur heard. Pulmonary/Chest: Effort normal and breath sounds normal. No respiratory  distress. He has no wheezes. He has no rales.  Musculoskeletal: He exhibits no edema.       Diabetic foot exam: Normal inspection No skin breakdown No calluses  Diminished DP/PT pulses Normal sensation to light tough and monofilament Nails normal, long  Lymphadenopathy:    He has no cervical adenopathy.  Skin: Skin is warm and dry. No rash noted.  Psychiatric: He has a normal mood and affect.       Assessment & Plan:

## 2011-01-31 NOTE — Progress Notes (Signed)
Addended by: Emelia Salisbury C on: 01/31/2011 10:04 AM   Modules accepted: Orders

## 2011-01-31 NOTE — Assessment & Plan Note (Signed)
Started red yeast rice per cards recs. Also on lovaza 4gm bid. continue to monitor.

## 2011-01-31 NOTE — Assessment & Plan Note (Signed)
Chronic. Well controlled, but endorsing some lows so will decrease am glipizide to 22m. Consider change to amaryl in future for qd dosing. RTC for AMW visit. Lab Results  Component Value Date   HGBA1C 5.6 12/28/2010

## 2011-02-02 ENCOUNTER — Encounter: Payer: Self-pay | Admitting: Family Medicine

## 2011-02-22 HISTORY — PX: CATARACT EXTRACTION W/ INTRAOCULAR LENS IMPLANT: SHX1309

## 2011-03-04 ENCOUNTER — Encounter: Payer: Medicare Other | Admitting: Family Medicine

## 2011-03-07 ENCOUNTER — Telehealth: Payer: Self-pay | Admitting: *Deleted

## 2011-03-07 NOTE — Telephone Encounter (Signed)
Signature required on form for diabetic shoes and inserts in your IN box

## 2011-03-07 NOTE — Telephone Encounter (Signed)
Signed and placed in my out box.

## 2011-03-08 ENCOUNTER — Encounter: Payer: Self-pay | Admitting: Family Medicine

## 2011-03-08 ENCOUNTER — Ambulatory Visit (INDEPENDENT_AMBULATORY_CARE_PROVIDER_SITE_OTHER): Payer: Medicare Other | Admitting: Family Medicine

## 2011-03-08 VITALS — BP 132/78 | HR 72 | Temp 98.3°F | Ht 69.0 in | Wt 356.0 lb

## 2011-03-08 DIAGNOSIS — M25562 Pain in left knee: Secondary | ICD-10-CM

## 2011-03-08 DIAGNOSIS — L821 Other seborrheic keratosis: Secondary | ICD-10-CM | POA: Diagnosis not present

## 2011-03-08 DIAGNOSIS — M25569 Pain in unspecified knee: Secondary | ICD-10-CM | POA: Diagnosis not present

## 2011-03-08 DIAGNOSIS — E119 Type 2 diabetes mellitus without complications: Secondary | ICD-10-CM

## 2011-03-08 DIAGNOSIS — Z0000 Encounter for general adult medical examination without abnormal findings: Secondary | ICD-10-CM | POA: Diagnosis not present

## 2011-03-08 DIAGNOSIS — K635 Polyp of colon: Secondary | ICD-10-CM

## 2011-03-08 NOTE — Assessment & Plan Note (Signed)
S/p colonoscopy 2008 at [facility], advised to call and inquire about when f/u due

## 2011-03-08 NOTE — Assessment & Plan Note (Signed)
Anticipate pes anserine bursitis. rec take home ibuprofen for this, avoid pressure on bursa. Stretching exercises from [facility] pt advisor provided Update me if not improving as expected.

## 2011-03-08 NOTE — Assessment & Plan Note (Signed)
Chronic. Good control.  Continue meds.  No further lows with decrease in AM glipizide.

## 2011-03-08 NOTE — Assessment & Plan Note (Signed)
Monitor for now, declines treatment today.

## 2011-03-08 NOTE — Patient Instructions (Signed)
Call [facility] to see when repeat colonoscopy due. We will check prostate level with next blood draw. For left knee pain - use pillow between legs to avoid pressure.  I think you have pes anserine bursitis, should improve with time, may use tylenol as well. Good to see you today, call us with questions.

## 2011-03-08 NOTE — Assessment & Plan Note (Signed)
I have personally reviewed the Medicare Annual Wellness questionnaire and have noted 1. The patient's medical and social history 2. Their use of alcohol, tobacco or illicit drugs 3. Their current medications and supplements 4. The patient's functional ability including ADL's, fall risks, home safety risks and hearing or visual impairment. 5. Diet and physical activities 6. Evidence for depression or mood disorders The patients weight, height, BMI have been recorded in the chart.  Hearing and vision has been addressed. I have made referrals, counseling and provided education to the patient based review of the above and I have provided the pt with a written personalized care plan for preventive services.  utd flu and tetanus. Consider pneumonia and hep b as diabetic. Discussed advanced directives.  Recent vision screen 12/2010, s/p hearing eval but doesn't want re eval because doesn't think would want second hearing aid.
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
## 2011-03-25 ENCOUNTER — Other Ambulatory Visit: Payer: Self-pay | Admitting: *Deleted

## 2011-03-25 MED ORDER — LISINOPRIL 40 MG PO TABS: 40.0000 mg | ORAL_TABLET | Freq: Every day | ORAL | Status: DC

## 2011-05-23 HISTORY — PX: OTHER SURGICAL HISTORY: SHX169

## 2011-05-26 ENCOUNTER — Other Ambulatory Visit: Payer: Self-pay | Admitting: *Deleted

## 2011-05-26 ENCOUNTER — Ambulatory Visit (INDEPENDENT_AMBULATORY_CARE_PROVIDER_SITE_OTHER): Payer: Medicare Other | Admitting: Family Medicine

## 2011-05-26 ENCOUNTER — Encounter: Payer: Self-pay | Admitting: Family Medicine

## 2011-05-26 VITALS — BP 128/78 | HR 76 | Temp 98.5°F | Wt 371.2 lb

## 2011-05-26 DIAGNOSIS — M79609 Pain in unspecified limb: Secondary | ICD-10-CM

## 2011-05-26 DIAGNOSIS — M25472 Effusion, left ankle: Secondary | ICD-10-CM | POA: Insufficient documentation

## 2011-05-26 DIAGNOSIS — M79606 Pain in leg, unspecified: Secondary | ICD-10-CM | POA: Insufficient documentation

## 2011-05-26 LAB — BASIC METABOLIC PANEL
CO2: 27 mEq/L (ref 19–32)
Calcium: 9 mg/dL (ref 8.4–10.5)
GFR: 95.13 mL/min (ref >60.00)
Potassium: 4 mEq/L (ref 3.5–5.1)
Sodium: 137 mEq/L (ref 135–145)

## 2011-05-26 LAB — CK: Total CK: 82 U/L (ref 7–232)

## 2011-05-26 MED ORDER — LEVOTHYROXINE SODIUM 100 MCG PO TABS: 100.0000 ug | ORAL_TABLET | Freq: Every day | ORAL | Status: DC

## 2011-05-26 NOTE — Progress Notes (Signed)
  Subjective:    Patient ID: Chad [name], male    DOB: 02/17/1956, 56 y.o.   MRN: [id number]  HPI CC: leg pains  1 wk h/o worsening bilateral leg pain R>L - anterior leg more than calf but pain present in calf.  Pain described as sore ache.  Worse with standing.  Forcing himself to walk 1 mile but in tears when arriving home.  vicodin not even helping but does help him sleep at night.  Denies leg swelling, erythema, warmth.  No foot pain.  Notices sharp pain with first steps but worse as he walks longer. Getting off feet improves pain.  Using cream that didn't help - tylenol OTC cream?  Also taking tylenol arthritis but not helping.  Lab Results  Component Value Date   HGBA1C 5.6 12/28/2010    Wt Readings from Last 3 Encounters:  05/26/11 371 lb 4 oz (168.398 kg)  03/08/11 356 lb (161.481 kg)  01/31/11 352 lb 4 oz (159.78 kg)  weight up 15 lbs since last visit.    Denies CP/tightness.  Endorses some sob with exhertion.  Review of Systems Per HPI    Objective:   Physical Exam  Nursing note and vitals reviewed. Constitutional: He appears well-developed and well-nourished. No distress.  Cardiovascular: Normal rate, regular rhythm, normal heart sounds and intact distal pulses.   No murmur heard. Pulmonary/Chest: Effort normal and breath sounds normal. No respiratory distress. He has no wheezes. He has no rales.  Musculoskeletal: He exhibits no edema.       Tender to palpation throughout anterior legs from below knee to ankles. No pitting edema. No palpable cords.  Diabetic foot exam: Normal inspection No skin breakdown No calluses  Normal DP pulses (2+) Normal sensation to light touch and monofilament Nails long   Skin: Skin is warm and dry. No rash noted.  Psychiatric: He has a normal mood and affect.       Assessment & Plan:

## 2011-05-26 NOTE — Assessment & Plan Note (Signed)
New.  Correlates to increase in red yeast rice to 657m 2 pills daily (started 1 month ago) Check K today as well as CK. rec stop red yeast rice to see if improvement. If not better by next week, schedule ABIs (however good pulses today, good cap refill LE). Doubt arthritis or arterial insufficiency related.  ?CVI. rec leg elevation.

## 2011-05-26 NOTE — Patient Instructions (Signed)
Stop red yeast rice to see if leg pain improves. If not better by next week, call me for referral to get ABIs. Blood work today. Keep legs elevated as much as possible.

## 2011-06-02 ENCOUNTER — Encounter: Payer: Self-pay | Admitting: Family Medicine

## 2011-06-02 ENCOUNTER — Ambulatory Visit (INDEPENDENT_AMBULATORY_CARE_PROVIDER_SITE_OTHER): Payer: Medicare Other | Admitting: Family Medicine

## 2011-06-02 VITALS — BP 122/64 | HR 67 | Temp 98.4°F | Ht 69.0 in | Wt 364.2 lb

## 2011-06-02 DIAGNOSIS — M79609 Pain in unspecified limb: Secondary | ICD-10-CM

## 2011-06-02 DIAGNOSIS — M79606 Pain in leg, unspecified: Secondary | ICD-10-CM

## 2011-06-02 NOTE — Patient Instructions (Signed)
Stay off feet for now and keep legs elevated as much as able. Pass by Marion's office for referral for ABIs. We will call you with results.

## 2011-06-02 NOTE — Progress Notes (Signed)
Addended by: [name] on: 06/02/2011 10:29 AM   Modules accepted: Orders

## 2011-06-02 NOTE — Assessment & Plan Note (Signed)
Normal CK, K. Off red yeast rice - not improved. Only better when off feet. ?CVI vs arterial insufficiency - order ABIs today. rec stay off red yeast rice for now. Intolerant to statins.

## 2011-06-02 NOTE — Progress Notes (Signed)
  Subjective:    Patient ID: Chad [name], male    DOB: Jan 04, 1956, 56 y.o.   MRN: [id number]  HPI CC: leg pain  Seen last week with anterior leg pain.  H/o statin intolerance - myalgias.  Stopped red yeast rice but no noted improvement, actually likely worse.  CK and BMP normal x glucose 156.    When walking feels like walking on sore.  Standing and walking makes pain worse.  Sitting down makes pain better.  Pain described as sharp stabbing from popliteal to calfs and anterior shins to ankles.  No foot pain.  R>L.  Some swelling noted.   Last night was fist night he was able to sleep well - only change was did no walking yesterday, kept legs elevated all day.  Pain medicine doesn't help (tylenol, aleve, vicodin)  No back pain, buttock pain, upper thigh pain.  Only having pain lower legs.  Denies CP/tightness, SOB.  No burning, tingling, numbness.  No new medicines.  Lab Results  Component Value Date   HGBA1C 5.6 12/28/2010   Wt Readings from Last 3 Encounters:  06/02/11 364 lb 4 oz (165.223 kg)  05/26/11 371 lb 4 oz (168.398 kg)  03/08/11 356 lb (161.481 kg)    Review of Systems Per HPI    Objective:   Physical Exam  Nursing note and vitals reviewed. Constitutional: He appears well-developed and well-nourished. No distress.  HENT:  Head: Normocephalic and atraumatic.  Mouth/Throat: Oropharynx is clear and moist. No oropharyngeal exudate.  Musculoskeletal: He exhibits no edema.       Tender to palpation throughout anterior legs at muscles/bones.  No palp cords.  No popliteal fullness.  No lower leg asymmetry 1+ DP pulses bilaterally.  Neurological:       Sensation, strength intact. Diminished DTRs bilateral LE  Skin: Skin is warm and dry. No rash noted.  Psychiatric: He has a normal mood and affect.       Assessment & Plan:

## 2011-06-04 ENCOUNTER — Encounter (HOSPITAL_COMMUNITY): Payer: Self-pay

## 2011-06-04 ENCOUNTER — Emergency Department (HOSPITAL_COMMUNITY)
Admission: EM | Admit: 2011-06-04 | Discharge: 2011-06-04 | Disposition: A | Discharge status: Home / Self Care | Payer: Medicare Other | Attending: Emergency Medicine | Admitting: Emergency Medicine

## 2011-06-04 ENCOUNTER — Emergency Department (HOSPITAL_COMMUNITY): Payer: Medicare Other

## 2011-06-04 DIAGNOSIS — X500XXA Overexertion from strenuous movement or load, initial encounter: Secondary | ICD-10-CM | POA: Insufficient documentation

## 2011-06-04 DIAGNOSIS — S99929A Unspecified injury of unspecified foot, initial encounter: Secondary | ICD-10-CM | POA: Diagnosis not present

## 2011-06-04 DIAGNOSIS — M25569 Pain in unspecified knee: Secondary | ICD-10-CM | POA: Insufficient documentation

## 2011-06-04 DIAGNOSIS — I10 Essential (primary) hypertension: Secondary | ICD-10-CM | POA: Diagnosis not present

## 2011-06-04 DIAGNOSIS — S8990XA Unspecified injury of unspecified lower leg, initial encounter: Secondary | ICD-10-CM | POA: Insufficient documentation

## 2011-06-04 DIAGNOSIS — E119 Type 2 diabetes mellitus without complications: Secondary | ICD-10-CM | POA: Insufficient documentation

## 2011-06-04 DIAGNOSIS — M79609 Pain in unspecified limb: Secondary | ICD-10-CM | POA: Insufficient documentation

## 2011-06-04 MED ORDER — OXYCODONE-ACETAMINOPHEN 5-325 MG PO TABS: 2.0000 | ORAL_TABLET | ORAL | Status: AC | PRN

## 2011-06-04 MED ORDER — OXYCODONE-ACETAMINOPHEN 5-325 MG PO TABS
2.0000 | ORAL_TABLET | Freq: Once | ORAL | Status: AC
  Administered 2011-06-04: 2 via ORAL
  Filled 2011-06-04: qty 2

## 2011-06-04 NOTE — ED Provider Notes (Signed)
Medical screening examination/treatment/procedure(s) were performed by non-physician practitioner and as supervising physician I was immediately available for consultation/collaboration.  Jasper Riling. Alvino Chapel, MD 06/04/11 4628

## 2011-06-04 NOTE — ED Notes (Signed)
Pt. Walking up the steps and felt something pop in his rt. Lateral knee area , at the time pt was unable to put weight on that rt. Lower leg. Swelling noted to his rt. Lateral lower leg area.
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
## 2011-06-04 NOTE — Discharge Instructions (Signed)
Knee Effusion The medical term for having fluid in your knee is effusion. This is often due to an internal derangement of the knee. This means something is wrong inside the knee. Some of the causes of fluid in the knee may be torn cartilage, a torn ligament, or bleeding into the joint from an injury. Your knee is likely more difficult to bend and move. This is often because there is increased pain and pressure in the joint. The time it takes for recovery from a knee effusion depends on different factors, including:   Type of injury.   Your age.   Physical and medical conditions.   Rehabilitation Strategies.  How long you will be away from your normal activities will depend on what kind of knee problem you have and how much damage is present. Your knee has two types of cartilage. Articular cartilage covers the bone ends and lets your knee bend and move smoothly. Two menisci, thick pads of cartilage that form a rim inside the joint, help absorb shock and stabilize your knee. Ligaments bind the bones together and support your knee joint. Muscles move the joint, help support your knee, and take stress off the joint itself. CAUSES  Often an effusion in the knee is caused by an injury to one of the menisci. This is often a tear in the cartilage. Recovery after a meniscus injury depends on how much meniscus is damaged and whether you have damaged other knee tissue. Small tears may heal on their own with conservative treatment. Conservative means rest, limited weight bearing activity and muscle strengthening exercises. Your recovery may take up to 6 weeks.  TREATMENT  Larger tears may require surgery. Meniscus injuries may be treated during arthroscopy. Arthroscopy is a procedure in which your surgeon uses a small telescope like instrument to look in your knee. Your caregiver can make a more accurate diagnosis (learning what is wrong) by performing an arthroscopic procedure. If your injury is on the inner  margin of the meniscus, your surgeon may trim the meniscus back to a smooth rim. In other cases your surgeon will try to repair a damaged meniscus with stitches (sutures). This may make rehabilitation take longer, but may provide better long term result by helping your knee keep its shock absorption capabilities. Ligaments which are completely torn usually require surgery for repair. HOME CARE INSTRUCTIONS  Use crutches as instructed.   If a brace is applied, use as directed.   Once you are home, an ice pack applied to your swollen knee may help with discomfort and help decrease swelling.   Keep your knee raised (elevated) when you are not up and around or on crutches.   Only take over-the-counter or prescription medicines for pain, discomfort, or fever as directed by your caregiver.   Your caregivers will help with instructions for rehabilitation of your knee. This often includes strengthening exercises.   You may resume a normal diet and activities as directed.  SEEK MEDICAL CARE IF:   There is increased swelling in your knee.   You notice redness, swelling, or increasing pain in your knee.   An unexplained oral temperature above 102 F (38.9 C) develops.  SEEK IMMEDIATE MEDICAL CARE IF:   You develop a rash.   You have difficulty breathing.   You have any allergic reactions from medications you may have been given.   There is severe pain with any motion of the knee.  MAKE SURE YOU:   Understand these instructions.  Will watch your condition.   Will get help right away if you are not doing well or get worse.  Document Released: 04/30/2003 Document Revised: 01/27/2011 Document Reviewed: 07/04/2007 [facility] Patient Information 2012 [place].Knee Wraps (Elastic Bandage) and RICE Knee wraps come in many different shapes and sizes and perform many different functions. Some wraps may provide cold therapy or warmth. Your caregiver will help you to determine what is best  for your protection, or recovery following your injury. The following are some general tips to help you use a knee wrap:  Use the wrap as directed.   Do not keep the wrap so tight that it cuts off the circulation of the leg below the wrap.   If your lower leg becomes blue, loses feeling, or becomes swollen below the wrap, it is probably too tight. Loosen the wrap as needed to improve these problems.   See your caregiver or trainer if the wrap seems to be making your problems worse rather than better.  Wraps in general help to remind you that you have an injury. They provide limited support. The few pounds of support they provide are minimal considering the hundreds of pounds of pressure it takes to injure a joint or tear ligaments.  The routine care of many injuries includes Rest, Ice, Compression, and Elevation (RICE).  Rest is required to allow your body to heal. Generally following bumps and bruises, routine activities can be resumed when comfortable. Injured tendons (cord-like structures that attach muscle to bone) and bones take approximately 6 to 12 weeks to heal.   Ice following an injury helps keep the swelling down and reduces pain. Do not apply ice directly to skin. Apply ice bags for 20-30 minutes every 3-4 hours for the first 2-3 days following injury or surgery. Place ice in a plastic bag with a towel around it.   Compression helps keep swelling down, gives support, and helps with discomfort. If a knee wrap has been applied, it should be removed and reapplied every 3 to 4 hours. It should be applied firmly enough to keep swelling down, but not too tightly. Watch your lower leg and toes for swelling, bluish discoloration, coldness, numbness or excessive pain. If any of these symptoms (problems) occur, remove the knee wrap and reapply more loosely. If these symptoms persist, contact your caregiver immediately.   Elevation helps reduce swelling, and decreases pain. With extremities  (arms/hands and legs/feet), the injured area should be placed near to or above the level of the heart if possible.  Persistent pain and inability to use the injured area for more than 2 to 3 days are warning signs indicating that you should see a caregiver for a follow-up visit as soon as possible. Initially, a hairline fracture (this is the same as a broken bone) may not be seen on x-rays.  Persistent pain and swelling mean limitedweight bearing (use of crutches as instructed) should continue. You may need further x-rays.  X-rays may not show a non-displaced fracture until a week or ten days later. Make a follow-up appointment with your caregiver. A radiologist (a specialist in reading x-rays) will re-read your x-rays. Make sure you know how to get your x-ray results. Do not assume everything is normal if you do not hear from your caregiver. MAKE SURE YOU:   Understand these instructions.   Will watch your condition.   Will get help right away if you are not doing well or get worse.  Document Released: 07/30/2001 Document Revised:  01/27/2011 Document Reviewed: 05/29/2008 [facility] Patient Information 2012 [place].

## 2011-06-04 NOTE — Progress Notes (Signed)
Orthopedic Tech Progress Note Patient Details:  Chad [name] February 05, 1956 [id number]  Other Ortho Devices Type of Ortho Device: Ace wrap;Crutches Ortho Device Location: right knee Ortho Device Interventions: Application Viewed order for ace wrap from rn order list  Hildred Priest 06/04/2011, 3:54 PM

## 2011-06-07 ENCOUNTER — Ambulatory Visit (INDEPENDENT_AMBULATORY_CARE_PROVIDER_SITE_OTHER): Payer: Medicare Other | Admitting: Family Medicine

## 2011-06-07 ENCOUNTER — Encounter: Payer: Self-pay | Admitting: Family Medicine

## 2011-06-07 VITALS — BP 142/68 | HR 104 | Temp 97.7°F | Wt 364.0 lb

## 2011-06-07 DIAGNOSIS — M25569 Pain in unspecified knee: Secondary | ICD-10-CM | POA: Diagnosis not present

## 2011-06-07 DIAGNOSIS — IMO0002 Reserved for concepts with insufficient information to code with codable children: Secondary | ICD-10-CM | POA: Diagnosis not present

## 2011-06-07 DIAGNOSIS — M25561 Pain in right knee: Secondary | ICD-10-CM

## 2011-06-07 DIAGNOSIS — M1711 Unilateral primary osteoarthritis, right knee: Secondary | ICD-10-CM | POA: Insufficient documentation

## 2011-06-07 NOTE — Patient Instructions (Signed)
Take ibuprofen three times a day for next 3-5 days (take with food).  Use knee sleeve for better compression/support. Pass by [facility]'s office for referral to orthopedist. Good to see you today, call us with questions. Keep appointment on Thursday for ABIs.

## 2011-06-07 NOTE — Progress Notes (Addendum)
  Subjective:    Patient ID: Chad [name], male    DOB: 1955/09/04, 56 y.o.   MRN: [id number]  HPI CC: R knee pain   pleasant 56 yo who i've been following for bilateral leg pain, scheduled for ABIs this Thursday presents with R knee pain.  DOI: Thursday 06/02/2011, working in yard, went to go up steps, felt "pop" and unable to put pressure on leg.  Actually seen at ER over weekend, records reviewed.  Xray negative for acute abnormality:  06/04/2011 *RADIOLOGY REPORT* Clinical Data: Right knee pain and popping for 2 weeks. RIGHT KNEE - COMPLETE 4+ VIEW Comparison: None. Findings: The mineralization and alignment are normal. There is no evidence of acute fracture or dislocation. There is mild patellofemoral joint space loss and patellar spurring. No significant knee joint effusion is identified. IMPRESSION: No acute osseous findings. Original Report Authenticated By: Vivia Ewing, M.D.   Placed in crutches, ACEI bandage and given pain meds.  Told needed MRI.  Currently with pain medial joint line along knee but much better than over weekend.  Using percocets (last took 2 d ago) and vicodin for pain (last was last night).  No locking.  +instability.  Able to bear weight (better than prior) but worse pain with walking.  Also worse with twisting knee.  Pressure/compression and ice to knee helps pain.  No popliteal pain.  No warmth or redness of knee, no fevers.  Xray - mild PF joint space loss with spurring.  Leg pain actually some better.  Review of Systems Per HPI    Objective:   Physical Exam  Nursing note and vitals reviewed. Constitutional: He appears well-developed and well-nourished. No distress.  HENT:  Head: Normocephalic and atraumatic.  Musculoskeletal: He exhibits no edema.       No popliteal fullness.  No palpable cords. Remains tender anterior legs.  FROM at knees bilaterally. + effusion/swelling superior knee patellofemoral. Neg drawer bilaterally, neg mcmurray's  bilaterally, no pain with valgus/varus testing. Neg PF grind. Tender throughout knee to palpation, but seems localized to medial joint line and pes anserine bursa. No knee warmth, erythema or deformity.  Skin: Skin is warm and dry. No rash noted.  Psychiatric: He has a normal mood and affect.       Assessment & Plan:

## 2011-06-07 NOTE — Assessment & Plan Note (Signed)
Xray showing mild PF arthritis and spurring.  Exam with moderate effusion present.  ?just OA flare vs severe pes anserine bursitis.  Doubt meniscal pathology. Rec rest, ice to knee, ibuprofen regularly for next 5 days (TID with food), and knee sleeve for compression. May continue crutch use. Refer to ortho for further evaluation.

## 2011-06-09 ENCOUNTER — Other Ambulatory Visit: Payer: Self-pay | Admitting: *Deleted

## 2011-06-09 ENCOUNTER — Encounter (INDEPENDENT_AMBULATORY_CARE_PROVIDER_SITE_OTHER): Payer: Medicare Other

## 2011-06-09 DIAGNOSIS — M79606 Pain in leg, unspecified: Secondary | ICD-10-CM

## 2011-06-09 DIAGNOSIS — I70219 Atherosclerosis of native arteries of extremities with intermittent claudication, unspecified extremity: Secondary | ICD-10-CM | POA: Diagnosis not present

## 2011-06-09 MED ORDER — HYDROCODONE-ACETAMINOPHEN 5-500 MG PO TABS: 1.0000 | ORAL_TABLET | Freq: Two times a day (BID) | ORAL | Status: DC | PRN

## 2011-06-09 NOTE — Telephone Encounter (Signed)
Ok to refill 

## 2011-06-09 NOTE — Telephone Encounter (Signed)
Ok to phone in.

## 2011-06-09 NOTE — Telephone Encounter (Signed)
Rx called in as directed.   

## 2011-06-13 ENCOUNTER — Ambulatory Visit: Payer: Self-pay | Admitting: Orthopedic Surgery

## 2011-06-13 DIAGNOSIS — M25469 Effusion, unspecified knee: Secondary | ICD-10-CM | POA: Diagnosis not present

## 2011-06-13 DIAGNOSIS — IMO0002 Reserved for concepts with insufficient information to code with codable children: Secondary | ICD-10-CM | POA: Diagnosis not present

## 2011-06-15 ENCOUNTER — Encounter: Payer: Self-pay | Admitting: Family Medicine

## 2011-06-22 ENCOUNTER — Telehealth: Payer: Self-pay

## 2011-06-22 HISTORY — PX: KNEE ARTHROSCOPY: SHX127

## 2011-06-22 NOTE — Telephone Encounter (Signed)
The patient is needing a cardiac clearance from Dr. [name] for patient to have a right knee arthroscopic surgery for Jun 29, 2011.  Per Dr. [name] the patient is cleared for surgery from a cardiac standpoint only. The clearance letter was faxed today.

## 2011-06-24 ENCOUNTER — Telehealth: Payer: Self-pay | Admitting: *Deleted

## 2011-06-24 NOTE — Telephone Encounter (Signed)
Form faxed

## 2011-06-24 NOTE — Telephone Encounter (Signed)
Filled and placed in kim[facility] box.

## 2011-06-24 NOTE — Telephone Encounter (Signed)
Form for diabetic testing supplies in your IN box 

## 2011-06-27 ENCOUNTER — Encounter (HOSPITAL_BASED_OUTPATIENT_CLINIC_OR_DEPARTMENT_OTHER): Payer: Self-pay | Admitting: *Deleted

## 2011-06-27 NOTE — Progress Notes (Signed)
Notes on pt were reviewed by dr Glennon Mac and oked Will need istat Told to bring all meds and overnight bag

## 2011-06-28 ENCOUNTER — Other Ambulatory Visit: Payer: Self-pay | Admitting: Physician Assistant
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
## 2011-06-29 ENCOUNTER — Encounter (HOSPITAL_BASED_OUTPATIENT_CLINIC_OR_DEPARTMENT_OTHER)
Admission: RE | Disposition: A | Discharge status: Home / Self Care | Payer: Self-pay | Source: Ambulatory Visit | Attending: Orthopedic Surgery

## 2011-06-29 ENCOUNTER — Ambulatory Visit (HOSPITAL_BASED_OUTPATIENT_CLINIC_OR_DEPARTMENT_OTHER)
Admission: RE | Admit: 2011-06-29 | Discharge: 2011-06-29 | Disposition: A | Discharge status: Home / Self Care | Payer: Medicare Other | Source: Ambulatory Visit | Attending: Orthopedic Surgery | Admitting: Orthopedic Surgery

## 2011-06-29 ENCOUNTER — Encounter (HOSPITAL_BASED_OUTPATIENT_CLINIC_OR_DEPARTMENT_OTHER): Payer: Self-pay | Admitting: Anesthesiology

## 2011-06-29 ENCOUNTER — Encounter (HOSPITAL_BASED_OUTPATIENT_CLINIC_OR_DEPARTMENT_OTHER): Payer: Self-pay | Admitting: *Deleted

## 2011-06-29 ENCOUNTER — Ambulatory Visit (HOSPITAL_BASED_OUTPATIENT_CLINIC_OR_DEPARTMENT_OTHER): Payer: Medicare Other | Admitting: Anesthesiology

## 2011-06-29 DIAGNOSIS — IMO0002 Reserved for concepts with insufficient information to code with codable children: Secondary | ICD-10-CM | POA: Insufficient documentation

## 2011-06-29 DIAGNOSIS — S83289A Other tear of lateral meniscus, current injury, unspecified knee, initial encounter: Secondary | ICD-10-CM | POA: Insufficient documentation

## 2011-06-29 DIAGNOSIS — M25562 Pain in left knee: Secondary | ICD-10-CM

## 2011-06-29 DIAGNOSIS — E119 Type 2 diabetes mellitus without complications: Secondary | ICD-10-CM | POA: Insufficient documentation

## 2011-06-29 DIAGNOSIS — I10 Essential (primary) hypertension: Secondary | ICD-10-CM | POA: Diagnosis not present

## 2011-06-29 DIAGNOSIS — E039 Hypothyroidism, unspecified: Secondary | ICD-10-CM | POA: Insufficient documentation

## 2011-06-29 DIAGNOSIS — G4730 Sleep apnea, unspecified: Secondary | ICD-10-CM | POA: Insufficient documentation

## 2011-06-29 DIAGNOSIS — M2240 Chondromalacia patellae, unspecified knee: Secondary | ICD-10-CM | POA: Insufficient documentation

## 2011-06-29 DIAGNOSIS — K219 Gastro-esophageal reflux disease without esophagitis: Secondary | ICD-10-CM | POA: Insufficient documentation

## 2011-06-29 DIAGNOSIS — R0602 Shortness of breath: Secondary | ICD-10-CM | POA: Diagnosis not present

## 2011-06-29 DIAGNOSIS — X58XXXA Exposure to other specified factors, initial encounter: Secondary | ICD-10-CM | POA: Insufficient documentation

## 2011-06-29 DIAGNOSIS — I2510 Atherosclerotic heart disease of native coronary artery without angina pectoris: Secondary | ICD-10-CM | POA: Insufficient documentation

## 2011-06-29 LAB — GLUCOSE, CAPILLARY: Glucose-Capillary: 129 mg/dL — ABNORMAL HIGH (ref 70–99)

## 2011-06-29 SURGERY — ARTHROSCOPY, KNEE, WITH MEDIAL MENISCECTOMY
Anesthesia: General | Site: Knee | Laterality: Right | Wound class: Clean

## 2011-06-29 MED ORDER — HYDROMORPHONE HCL PF 1 MG/ML IJ SOLN
0.2500 mg | INTRAMUSCULAR | Status: DC | PRN
  Administered 2011-06-29: 0.25 mg via INTRAVENOUS
  Administered 2011-06-29: 0.5 mg via INTRAVENOUS

## 2011-06-29 MED ORDER — ONDANSETRON HCL 4 MG/2ML IJ SOLN: 4.0000 mg | Freq: Four times a day (QID) | INTRAMUSCULAR | Status: DC | PRN

## 2011-06-29 MED ORDER — CHLORHEXIDINE GLUCONATE 4 % EX LIQD: 60.0000 mL | Freq: Once | CUTANEOUS | Status: DC

## 2011-06-29 MED ORDER — FENTANYL CITRATE 0.05 MG/ML IJ SOLN
INTRAMUSCULAR | Status: DC | PRN
  Administered 2011-06-29: 50 ug via INTRAVENOUS

## 2011-06-29 MED ORDER — OXYCODONE-ACETAMINOPHEN 5-325 MG PO TABS: ORAL_TABLET | ORAL | Status: DC

## 2011-06-29 MED ORDER — LACTATED RINGERS IV SOLN: INTRAVENOUS | Status: DC

## 2011-06-29 MED ORDER — LACTATED RINGERS IV SOLN
INTRAVENOUS | Status: DC
  Administered 2011-06-29 (×2): via INTRAVENOUS

## 2011-06-29 MED ORDER — PROPOFOL 10 MG/ML IV EMUL
INTRAVENOUS | Status: DC | PRN
  Administered 2011-06-29: 300 mg via INTRAVENOUS
  Administered 2011-06-29: 100 mg via INTRAVENOUS

## 2011-06-29 MED ORDER — CEFAZOLIN SODIUM-DEXTROSE 2-3 GM-% IV SOLR
2.0000 g | INTRAVENOUS | Status: AC
  Administered 2011-06-29: 2 g via INTRAVENOUS

## 2011-06-29 MED ORDER — ONDANSETRON HCL 4 MG/2ML IJ SOLN
INTRAMUSCULAR | Status: DC | PRN
  Administered 2011-06-29: 4 mg via INTRAVENOUS

## 2011-06-29 MED ORDER — LIDOCAINE HCL (CARDIAC) 20 MG/ML IV SOLN
INTRAVENOUS | Status: DC | PRN
  Administered 2011-06-29: 100 mg via INTRAVENOUS

## 2011-06-29 MED ORDER — OXYCODONE-ACETAMINOPHEN 5-325 MG PO TABS
1.0000 | ORAL_TABLET | Freq: Once | ORAL | Status: AC
  Administered 2011-06-29: 1 via ORAL

## 2011-06-29 MED ORDER — MIDAZOLAM HCL 5 MG/5ML IJ SOLN
INTRAMUSCULAR | Status: DC | PRN
  Administered 2011-06-29: 1 mg via INTRAVENOUS

## 2011-06-29 MED ORDER — FENTANYL CITRATE 0.05 MG/ML IJ SOLN
50.0000 ug | INTRAMUSCULAR | Status: DC | PRN
  Administered 2011-06-29: 50 ug via INTRAVENOUS

## 2011-06-29 MED ORDER — BUPIVACAINE-EPINEPHRINE PF 0.5-1:200000 % IJ SOLN
INTRAMUSCULAR | Status: DC | PRN
  Administered 2011-06-29: 15 mL

## 2011-06-29 MED ORDER — MIDAZOLAM HCL 2 MG/2ML IJ SOLN
1.0000 mg | INTRAMUSCULAR | Status: DC | PRN
  Administered 2011-06-29: 1 mg via INTRAVENOUS

## 2011-06-29 SURGICAL SUPPLY — 41 items
BANDAGE ELASTIC 6 VELCRO ST LF (GAUZE/BANDAGES/DRESSINGS) ×2 IMPLANT
BLADE CUTTER GATOR 3.5 (BLADE) ×1 IMPLANT
BLADE GREAT WHITE 4.2 (BLADE) ×2 IMPLANT
BLADE SURG 15 STRL LF DISP TIS (BLADE) IMPLANT
BLADE SURG 15 STRL SS (BLADE)
BNDG COHESIVE 4X5 TAN STRL (GAUZE/BANDAGES/DRESSINGS) IMPLANT
CANISTER OMNI JUG 16 LITER (MISCELLANEOUS) ×1 IMPLANT
CANISTER SUCTION 2500CC (MISCELLANEOUS) IMPLANT
DRAPE ARTHROSCOPY W/POUCH 90 (DRAPES) ×2 IMPLANT
DURAPREP 26ML APPLICATOR (WOUND CARE) ×2 IMPLANT
GAUZE XEROFORM 1X8 LF (GAUZE/BANDAGES/DRESSINGS) ×2 IMPLANT
GLOVE BIO SURGEON STRL SZ 6.5 (GLOVE) ×2 IMPLANT
GLOVE BIO SURGEON STRL SZ7 (GLOVE) ×2 IMPLANT
GLOVE BIOGEL PI IND STRL 7.0 (GLOVE) ×1 IMPLANT
GLOVE BIOGEL PI IND STRL 7.5 (GLOVE) ×1 IMPLANT
GLOVE BIOGEL PI INDICATOR 7.0 (GLOVE) ×1
GLOVE BIOGEL PI INDICATOR 7.5 (GLOVE) ×2
GLOVE SS BIOGEL STRL SZ 7.5 (GLOVE) ×1 IMPLANT
GLOVE SUPERSENSE BIOGEL SZ 7.5 (GLOVE) ×1
GOWN PREVENTION PLUS XLARGE (GOWN DISPOSABLE) ×5 IMPLANT
HOLDER KNEE FOAM BLUE (MISCELLANEOUS) ×2 IMPLANT
KNEE WRAP E Z 3 GEL PACK (MISCELLANEOUS) ×2 IMPLANT
NDL SAFETY ECLIPSE 18X1.5 (NEEDLE) ×2 IMPLANT
NEEDLE HYPO 18GX1.5 SHARP (NEEDLE) ×4
NEEDLE HYPO 22GX1.5 SAFETY (NEEDLE) IMPLANT
PACK ARTHROSCOPY DSU (CUSTOM PROCEDURE TRAY) ×2 IMPLANT
PACK BASIN DAY SURGERY FS (CUSTOM PROCEDURE TRAY) ×2 IMPLANT
PAD ALCOHOL SWAB (MISCELLANEOUS) IMPLANT
SET ARTHROSCOPY TUBING (MISCELLANEOUS) ×2
SET ARTHROSCOPY TUBING LN (MISCELLANEOUS) ×1 IMPLANT
SPONGE GAUZE 4X4 12PLY (GAUZE/BANDAGES/DRESSINGS) ×2 IMPLANT
SUCTION FRAZIER TIP 10 FR DISP (SUCTIONS) IMPLANT
SUT ETHILON 4 0 PS 2 18 (SUTURE) ×2 IMPLANT
SUT PROLENE 3 0 PS 2 (SUTURE) IMPLANT
SUT VIC AB 3-0 PS1 18 (SUTURE)
SUT VIC AB 3-0 PS1 18XBRD (SUTURE) IMPLANT
SYR 20CC LL (SYRINGE) IMPLANT
SYR 5ML LL (SYRINGE) ×2 IMPLANT
TOWEL OR 17X24 6PK STRL BLUE (TOWEL DISPOSABLE) ×2 IMPLANT
WAND STAR VAC 90 (SURGICAL WAND) IMPLANT
WATER STERILE IRR 1000ML POUR (IV SOLUTION) ×2 IMPLANT

## 2011-06-29 NOTE — Transfer of Care (Signed)
Immediate Anesthesia Transfer of Care Note  Patient: Chad [name]  Procedure(s) Performed: Procedure(s) (LRB): KNEE ARTHROSCOPY WITH MEDIAL MENISECTOMY (Right)  Patient Location: PACU  Anesthesia Type: General  Level of Consciousness: awake and alert   Airway & Oxygen Therapy: Patient Spontanous Breathing and Patient connected to face mask oxygen  Post-op Assessment: Report given to PACU RN and Post -op Vital signs reviewed and stable  Post vital signs: Reviewed and stable  Complications: No apparent anesthesia complications

## 2011-06-29 NOTE — Anesthesia Procedure Notes (Signed)
Procedure Name: LMA Insertion Date/Time: 06/29/2011 11:25 AM Performed by: Lieutenant Diego Pre-anesthesia Checklist: Patient identified, Emergency Drugs available, Suction available and Patient being monitored Patient Re-evaluated:Patient Re-evaluated prior to inductionOxygen Delivery Method: Circle System Utilized Preoxygenation: Pre-oxygenation with 100% oxygen Intubation Type: IV induction Ventilation: Mask ventilation without difficulty LMA: LMA with gastric port inserted LMA Size: 5.0 Number of attempts: 1 Airway Equipment and Method: bite block Placement Confirmation: positive ETCO2 and breath sounds checked- equal and bilateral Tube secured with: Tape Dental Injury: Teeth and Oropharynx as per pre-operative assessment

## 2011-06-29 NOTE — Anesthesia Postprocedure Evaluation (Signed)
Anesthesia Post Note  Patient: Chad [name]  Procedure(s) Performed: Procedure(s) (LRB): KNEE ARTHROSCOPY WITH MEDIAL MENISECTOMY (Right)  Anesthesia type: General  Patient location: PACU  Post pain: Pain level controlled and Adequate analgesia  Post assessment: Post-op Vital signs reviewed, Patient's Cardiovascular Status Stable, Respiratory Function Stable, Patent Airway and Pain level controlled  Last Vitals:  Filed Vitals:   06/29/11 1204  BP: 171/76  Pulse: 90  Temp: 36.9 C  Resp: 20    Post vital signs: Reviewed and stable  Level of consciousness: awake, alert  and oriented  Complications: No apparent anesthesia complications

## 2011-06-29 NOTE — Progress Notes (Signed)
Notified Dr. Ola Spurr of pt getting dilaudid for pain and off O2 x 30 mins and doing well- sats > 95%. Dr Ola Spurr asked pt is he used CPAP - pt said no. It has been 7-8 years ago since he had neck surgery.

## 2011-06-29 NOTE — Op Note (Signed)
NAMERONITH, BERTI NO.:  [id number]  MEDICAL RECORD NO.:  [id number]  LOCATION:  [id number]                        FACILITY:  [facility]  PHYSICIAN:  Herbie Baltimore A. Noemi Chapel, M.D. DATE OF BIRTH:  07/19/1955  DATE OF PROCEDURE:  06/29/2011 DATE OF DISCHARGE:  06/04/2011                              OPERATIVE REPORT   PREOPERATIVE DIAGNOSIS:  Right knee medial and lateral meniscal tears with chondromalacia.  POSTOPERATIVE DIAGNOSIS:  Right knee medial and lateral meniscal tears with chondromalacia.  PROCEDURE:  Right knee EUA, followed by arthroscopic partial medial and lateral meniscectomies with chondroplasty.  SURGEON:  Audree Camel. Noemi Chapel, M.D.  ASSISTING:  Matthew Saras, PA-C.  ANESTHESIA:  General.  OPERATIVE TIME:  40 minutes.  COMPLICATIONS:  None.  INDICATION FOR PROCEDURE:  Mr. Treese is a 57 year old gentleman who injured his right knee six weeks ago.  Severe pain with positive exam for medial meniscus tear confirmed by x-rays and MRI.  He has failed conservative care and is now to undergo arthroscopy.  DESCRIPTION:  Mr. Ollis was brought to the operating room on Jun 29, 2011, after knee block was placed on holding by anesthesia.  He was placed on the operating table in supine position.  He received Ancef 2 g IV preoperatively for prophylaxis.  After being placed under general anesthesia, his right knee was examined.  He had full range of motion, knee was stable, ligamentous exam with normal patellar tracking.  The right leg was prepped using sterile DuraPrep and draped using sterile technique.  Time-out procedure was called, and the correct right knee identified.  Initially, through an anterolateral portal, the arthroscope with a pump attached was placed into an anteromedial portal, an arthroscopic probe was placed.  On initial inspection medial compartment, he had 75% grade 3 chondromalacia, which was debrided.  He had a tear of the posterior medial  horn of the medial meniscus of which 40% was resected back to a stable rim.  Intercondylar notch inspected. Anteroposterior cruciate ligaments were normal.  Lateral compartment inspected and articular cartilage normal, lateral meniscus showed a small tear of 15% posterolateral corner, which was resected back to a stable rim.  Patellofemoral joint showed 30-40% grade 3 chondromalacia on the patella which was debrided.  Grade 1 and 2 changes on the femoral groove.  The patella tracked normally.  Medial and lateral gutters were free of pathology.  After this was done, it was felt that all pathology had been satisfactorily addressed.  The instruments were removed. Portals closed with 3-0 nylon sutures.  Sterile dressings were applied, and the patient awakened and taken to recovery room in stable condition.  FOLLOWUP CARE:  Mr. Lozito to be followed as an outpatient on Percocet for pain.  He will be seen back in the office in a week for sutures out and followup.     Britteny Fiebelkorn A. Noemi Chapel, M.D.     RAW/MEDQ  D:  06/29/2011  T:  06/29/2011  Job:  [id number]

## 2011-06-29 NOTE — Brief Op Note (Signed)
06/29/2011  11:50 AM  PATIENT:  Chad [name]  56 y.o. male  PRE-OPERATIVE DIAGNOSIS:  right knee mmt  POST-OPERATIVE DIAGNOSIS:  right knee medial and lateral meniscus tear with chondromalcia  PROCEDURE:  Procedure(s) (LRB): KNEE ARTHROSCOPY WITH MEDIAL AND LATERAL MENISECTOMY WITH CHONDROPLASTY (Right)  SURGEON:  Surgeon(s) and Role:    * Lorn Junes, MD - Primary  PHYSICIAN ASSISTANT: Kirstin Shepperson PA-C  ASSISTANTS: Kirstin Shepperson PA-C   ANESTHESIA:   general  EBL:  Total I/O In: 600 [I.V.:600] Out: -   BLOOD ADMINISTERED:none  DRAINS: none   LOCAL MEDICATIONS USED:  NONE  SPECIMEN:  No Specimen  DISPOSITION OF SPECIMEN:  N/A  COUNTS:  YES  TOURNIQUET:  * No tourniquets in log *  DICTATION: .Note written in EPIC  PLAN OF CARE: Discharge to home after PACU  PATIENT DISPOSITION:  PACU - hemodynamically stable.   Delay start of Pharmacological VTE agent (>24hrs) due to surgical blood loss or risk of bleeding: not applicable

## 2011-06-29 NOTE — Progress Notes (Signed)
Dr. Ola Spurr spoke with pt's wife about his sleep apnea and not using CPAP -- OK for home

## 2011-06-29 NOTE — Anesthesia Preprocedure Evaluation (Addendum)
Anesthesia Evaluation  Patient identified by MRN, date of birth, ID band Patient awake    Reviewed: Allergy & Precautions, H&P , NPO status , Patient's Chart, lab work & pertinent test results  Airway Mallampati: III  Neck ROM: limited    Dental   Pulmonary shortness of breath, sleep apnea ,          Cardiovascular hypertension, + CAD  Cath on 06/2010 showed mild CAD, medical management.   Neuro/Psych    GI/Hepatic GERD-  ,  Endo/Other  Diabetes mellitus-Hypothyroidism Morbid obesity  Renal/GU      Musculoskeletal   Abdominal   Peds  Hematology   Anesthesia Other Findings   Reproductive/Obstetrics                          Anesthesia Physical Anesthesia Plan  ASA: III  Anesthesia Plan: General   Post-op Pain Management:    Induction: Intravenous  Airway Management Planned: Oral ETT  Additional Equipment:   Intra-op Plan:   Post-operative Plan: Extubation in OR  Informed Consent: I have reviewed the patients History and Physical, chart, labs and discussed the procedure including the risks, benefits and alternatives for the proposed anesthesia with the patient or authorized representative who has indicated his/her understanding and acceptance.     Plan Discussed with: CRNA and Surgeon  Anesthesia Plan Comments:         Anesthesia Quick Evaluation

## 2011-06-29 NOTE — Discharge Instructions (Signed)

## 2011-06-29 NOTE — Progress Notes (Signed)
Assisted Dr. Marcie Bal with right, knee block. Side rails up, monitors on throughout procedure. See vital signs in flow sheet. Tolerated Procedure well.
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
## 2011-06-29 NOTE — Interval H&P Note (Signed)
History and Physical Interval Note:  06/29/2011 11:11 AM  Chad [name]  has presented today for surgery, with the diagnosis of right knee mmt  The various methods of treatment have been discussed with the patient and family. After consideration of risks, benefits and other options for treatment, the patient has consented to  Procedure(s) (LRB): KNEE ARTHROSCOPY WITH MEDIAL MENISECTOMY (Right) as a surgical intervention .  The patients' history has been reviewed, patient examined, no change in status, stable for surgery.  I have reviewed the patients' chart and labs.  Questions were answered to the patient's satisfaction.     [name]

## 2011-07-06 ENCOUNTER — Ambulatory Visit (INDEPENDENT_AMBULATORY_CARE_PROVIDER_SITE_OTHER): Payer: Medicare Other | Admitting: Family Medicine

## 2011-07-06 ENCOUNTER — Encounter: Payer: Self-pay | Admitting: Family Medicine

## 2011-07-06 VITALS — BP 132/68 | HR 72 | Temp 98.2°F | Wt 359.8 lb

## 2011-07-06 DIAGNOSIS — B368 Other specified superficial mycoses: Secondary | ICD-10-CM

## 2011-07-06 DIAGNOSIS — B369 Superficial mycosis, unspecified: Secondary | ICD-10-CM | POA: Diagnosis not present

## 2011-07-06 DIAGNOSIS — H6241 Otitis externa in other diseases classified elsewhere, right ear: Secondary | ICD-10-CM | POA: Insufficient documentation

## 2011-07-06 DIAGNOSIS — H6240 Otitis externa in other diseases classified elsewhere, unspecified ear: Secondary | ICD-10-CM | POA: Diagnosis not present

## 2011-07-06 HISTORY — DX: Superficial mycosis, unspecified: B36.9

## 2011-07-06 MED ORDER — CLOTRIMAZOLE 1 % EX SOLN: Freq: Two times a day (BID) | CUTANEOUS | Status: DC

## 2011-07-06 NOTE — Patient Instructions (Signed)
You have external ear infection likely due to fungus. Treat with clotrimazole solution twice daily for 14 days.  Return in ~10 days for recheck.

## 2011-07-06 NOTE — Progress Notes (Signed)
Addended by: [name] on: 07/06/2011 09:38 AM   Modules accepted: Orders

## 2011-07-06 NOTE — Progress Notes (Signed)
  Subjective:    Patient ID: Chad [name], male    DOB: [birth year], 56 y.o.   MRN: [id number]  HPI CC: roaring in right ear  Going on for 1 month.  No pain in ear.  Uses hearing aide on right side.  Feels right ear muffled continually.  + sinus congestion.  H/o T2DM, well controlled.   Lab Results  Component Value Date   HGBA1C 5.6 12/28/2010   No recent swimming.  No fevers/chills.  Able to pop ear a little bit.  Has used qtip with EtOH to clean out ear, black debris came out.  Review of Systems Per HPI    Objective:   Physical Exam  Nursing note and vitals reviewed. Constitutional: He appears well-developed and well-nourished.  HENT:  Head: Normocephalic and atraumatic.  Right Ear: Decreased hearing is noted.  Left Ear: Tympanic membrane, external ear and ear canal normal.  Nose: Nose normal.  Mouth/Throat: Oropharynx is clear and moist. No oropharyngeal exudate.       R TM - hearing aid removed.  pearly grey.  External canal with black debris medial and posteriorly.  Removed with plastic curette and qtip under direct visualization but unable to fully remove debris due to poor pt tolerance.  Ear hairs with black spores on tips  Lymphadenopathy:    He has no cervical adenopathy.       Assessment & Plan:

## 2011-07-06 NOTE — Assessment & Plan Note (Signed)
Exam consistent with otomycosis. Long duration - likely 1+ mo. Cleaned ear as best able. Will treat with clotrimazole soln bid for 14 days, return in 10 d for recheck.  If not improving, will refer to ENT. Pt agrees with plan.

## 2011-07-06 NOTE — Progress Notes (Signed)
Addended by: Ellamae Sia on: 07/06/2011 09:41 AM   Modules accepted: Orders

## 2011-07-08 ENCOUNTER — Telehealth: Payer: Self-pay

## 2011-07-08 DIAGNOSIS — B369 Superficial mycosis, unspecified: Secondary | ICD-10-CM

## 2011-07-08 NOTE — Telephone Encounter (Signed)
Let's stop drops.  will refer to ENT.  Placed order in [place].  If not feeling better, to come in this afternoon for recheck.

## 2011-07-08 NOTE — Telephone Encounter (Signed)
Spoke with patient's wife. She said he wasn't feeling as nauseated or dizzy as he was. He declined appt today and said if he got worse or felt worse tomorrow, he would go to Saturday clinic. Otherwise he will wait until scheduled ENT appt. He will D/C ear drops.

## 2011-07-08 NOTE — Telephone Encounter (Signed)
pts wife said pt seen 07/06/11 and started using Clotrimazole drops 07/07/11. Today at 9 am put 2 drops in affected ear and pt said had burning and stinging in ear, pt became nauseated. Pt still feels nauseated and whoozy and pt cannot hear out of ear. Instructed not to use any more drops until call back from Dr Bosie Clos nurse. Pt uses [facility] and can be reached at [phone].

## 2011-07-08 NOTE — Telephone Encounter (Signed)
Patient calling about pain and burning when applying ear drops to R ear.  Per Epic, note done by office staff in speaking with spouse; note outstanding to MD.  Declines new triage; info to office for followup/callback.  MAY REACH PATIENT AT [phone].

## 2011-07-10 ENCOUNTER — Emergency Department (HOSPITAL_COMMUNITY)
Admission: EM | Admit: 2011-07-10 | Discharge: 2011-07-10 | Disposition: A | Discharge status: Home / Self Care | Payer: Medicare Other | Attending: Emergency Medicine | Admitting: Emergency Medicine

## 2011-07-10 ENCOUNTER — Encounter (HOSPITAL_COMMUNITY): Payer: Self-pay | Admitting: *Deleted

## 2011-07-10 DIAGNOSIS — I10 Essential (primary) hypertension: Secondary | ICD-10-CM | POA: Insufficient documentation

## 2011-07-10 DIAGNOSIS — Z9884 Bariatric surgery status: Secondary | ICD-10-CM | POA: Insufficient documentation

## 2011-07-10 DIAGNOSIS — H9209 Otalgia, unspecified ear: Secondary | ICD-10-CM | POA: Diagnosis not present

## 2011-07-10 DIAGNOSIS — E785 Hyperlipidemia, unspecified: Secondary | ICD-10-CM | POA: Insufficient documentation

## 2011-07-10 DIAGNOSIS — E119 Type 2 diabetes mellitus without complications: Secondary | ICD-10-CM | POA: Diagnosis not present

## 2011-07-10 DIAGNOSIS — R110 Nausea: Secondary | ICD-10-CM | POA: Diagnosis not present

## 2011-07-10 MED ORDER — CIPROFLOXACIN-DEXAMETHASONE 0.3-0.1 % OT SUSP: 4.0000 [drp] | Freq: Two times a day (BID) | OTIC | Status: AC

## 2011-07-10 MED ORDER — AMOXICILLIN 500 MG PO CAPS: 500.0000 mg | ORAL_CAPSULE | Freq: Three times a day (TID) | ORAL | Status: AC

## 2011-07-10 NOTE — ED Provider Notes (Signed)
Medical screening examination/treatment/procedure(s) were performed by non-physician practitioner and as supervising physician I was immediately available for consultation/collaboration.   Mervin Kung, MD 07/10/11 [phone]

## 2011-07-10 NOTE — ED Notes (Signed)
Reports having right ear pain since Friday, prescribed ear drop by pcp, had severe pain when using the ear drops last night and since then having nausea and no appetite. No acute distress noted at triage.

## 2011-07-10 NOTE — Discharge Instructions (Signed)
Otalgia The most common reason for this in children is an infection of the middle ear. Pain from the middle ear is usually caused by a build-up of fluid and pressure behind the eardrum. Pain from an earache can be sharp, dull, or burning. The pain may be temporary or constant. The middle ear is connected to the nasal passages by a short narrow tube called the Eustachian tube. The Eustachian tube allows fluid to drain out of the middle ear, and helps keep the pressure in your ear equalized. CAUSES  A cold or allergy can block the Eustachian tube with inflammation and the build-up of secretions. This is especially likely in small children, because their Eustachian tube is shorter and more horizontal. When the Eustachian tube closes, the normal flow of fluid from the middle ear is stopped. Fluid can accumulate and cause stuffiness, pain, hearing loss, and an ear infection if germs start growing in this area. SYMPTOMS  The symptoms of an ear infection may include fever, ear pain, fussiness, increased crying, and irritability. Many children will have temporary and minor hearing loss during and right after an ear infection. Permanent hearing loss is rare, but the risk increases the more infections a child has. Other causes of ear pain include retained water in the outer ear canal from swimming and bathing. Ear pain in adults is less likely to be from an ear infection. Ear pain may be referred from other locations. Referred pain may be from the joint between your jaw and the skull. It may also come from a tooth problem or problems in the neck. Other causes of ear pain include:  A foreign body in the ear.   Outer ear infection.   Sinus infections.   Impacted ear wax.   Ear injury.   Arthritis of the jaw or TMJ problems.   Middle ear infection.   Tooth infections.   Sore throat with pain to the ears.  DIAGNOSIS  Your caregiver can usually make the diagnosis by examining you. Sometimes other special  studies, including x-rays and lab work may be necessary. TREATMENT   If antibiotics were prescribed, use them as directed and finish them even if you or your child's symptoms seem to be improved.   Sometimes PE tubes are needed in children. These are little plastic tubes which are put into the eardrum during a simple surgical procedure. They allow fluid to drain easier and allow the pressure in the middle ear to equalize. This helps relieve the ear pain caused by pressure changes.  HOME CARE INSTRUCTIONS   Only take over-the-counter or prescription medicines for pain, discomfort, or fever as directed by your caregiver. DO NOT GIVE CHILDREN ASPIRIN because of the association of Reye's Syndrome in children taking aspirin.   Use a cold pack applied to the outer ear for 15 to 20 minutes, 3 to 4 times per day or as needed may reduce pain. Do not apply ice directly to the skin. You may cause frost bite.   Over-the-counter ear drops used as directed may be effective. Your caregiver may sometimes prescribe ear drops.   Resting in an upright position may help reduce pressure in the middle ear and relieve pain.   Ear pain caused by rapidly descending from high altitudes can be relieved by swallowing or chewing gum. Allowing infants to suck on a bottle during airplane travel can help.   Do not smoke in the house or near children. If you are unable to quit smoking, smoke outside.  Control allergies.  SEEK IMMEDIATE MEDICAL CARE IF:   You or your child are becoming sicker.   Pain or fever relief is not obtained with medicine.   You or your child's symptoms (pain, fever, or irritability) do not improve within 24 to 48 hours or as instructed.   Severe pain suddenly stops hurting. This may indicate a ruptured eardrum.   You or your children develop new problems such as severe headaches, stiff neck, difficulty swallowing, or swelling of the face or around the ear.  Document Released: 09/25/2003  Document Revised: 01/27/2011 Document Reviewed: 01/30/2008 [facility] Patient Information 2012 [place].
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
## 2011-07-10 NOTE — ED Notes (Signed)
Pt c/o right ear pain since last week. After using ear drops prescribed by MD, pain worse, "feeling terrible". Reporting decreased hearing in right ear, pain.

## 2011-07-10 NOTE — ED Notes (Signed)
Pharmacy was called, what pt was given as ear drops was incorrect, was not meant to be used as ear drops, was topical solution only. Pt reports not being able to hear out of right ear.

## 2011-07-14 DIAGNOSIS — H6120 Impacted cerumen, unspecified ear: Secondary | ICD-10-CM | POA: Diagnosis not present

## 2011-07-14 DIAGNOSIS — H905 Unspecified sensorineural hearing loss: Secondary | ICD-10-CM | POA: Diagnosis not present

## 2011-07-14 DIAGNOSIS — H903 Sensorineural hearing loss, bilateral: Secondary | ICD-10-CM | POA: Diagnosis not present

## 2011-07-14 DIAGNOSIS — B369 Superficial mycosis, unspecified: Secondary | ICD-10-CM | POA: Diagnosis not present

## 2011-07-20 ENCOUNTER — Ambulatory Visit: Payer: Medicare Other | Admitting: Family Medicine

## 2011-07-20 DIAGNOSIS — Z0289 Encounter for other administrative examinations: Secondary | ICD-10-CM

## 2011-07-22 DIAGNOSIS — H6120 Impacted cerumen, unspecified ear: Secondary | ICD-10-CM | POA: Diagnosis not present

## 2011-07-22 DIAGNOSIS — B369 Superficial mycosis, unspecified: Secondary | ICD-10-CM | POA: Diagnosis not present

## 2011-07-22 DIAGNOSIS — H903 Sensorineural hearing loss, bilateral: Secondary | ICD-10-CM | POA: Diagnosis not present

## 2011-07-22 DIAGNOSIS — H905 Unspecified sensorineural hearing loss: Secondary | ICD-10-CM | POA: Diagnosis not present

## 2011-07-25 ENCOUNTER — Other Ambulatory Visit: Payer: Self-pay | Admitting: *Deleted

## 2011-07-25 MED ORDER — ESOMEPRAZOLE MAGNESIUM 40 MG PO CPDR: 40.0000 mg | DELAYED_RELEASE_CAPSULE | Freq: Two times a day (BID) | ORAL | Status: DC

## 2011-07-25 MED ORDER — LISINOPRIL 40 MG PO TABS: 40.0000 mg | ORAL_TABLET | Freq: Every day | ORAL | Status: DC

## 2011-07-25 NOTE — Telephone Encounter (Signed)
Received refill request for Actos 30 mg 1 PO QD. This isn't on current med list, but I don't see in notes where it has been dc'd. Can you please advise?

## 2011-07-26 MED ORDER — PIOGLITAZONE HCL 30 MG PO TABS: 30.0000 mg | ORAL_TABLET | Freq: Every day | ORAL | Status: DC

## 2011-07-26 NOTE — Telephone Encounter (Signed)
Patient confirmed he is still taking med. Refilled with 1 month supply as requested.

## 2011-07-26 NOTE — Telephone Encounter (Signed)
i believe he should still be on actos 19m daily. Can we call and verify that he's still taking prior to sending in? And ask if he wants 1 or 3 mo supply.

## 2011-08-02 ENCOUNTER — Other Ambulatory Visit: Payer: Self-pay | Admitting: *Deleted

## 2011-08-02 MED ORDER — HYDROCODONE-ACETAMINOPHEN 5-500 MG PO TABS: 1.0000 | ORAL_TABLET | Freq: Two times a day (BID) | ORAL | Status: DC | PRN

## 2011-08-02 NOTE — Telephone Encounter (Signed)
Received faxed refill request from pharmacy for Hydrocodone 5/500 #60, take one by mouth two times a day as needed. Last refill 06/09/11 #60. Medication is no longer on med sheet. Is it okay to refill medication?

## 2011-08-02 NOTE — Telephone Encounter (Signed)
Rx called in as directed.   

## 2011-08-02 NOTE — Telephone Encounter (Signed)
plz phone in. 

## 2011-08-09 ENCOUNTER — Other Ambulatory Visit: Payer: Self-pay | Admitting: *Deleted

## 2011-08-09 LAB — FUNGUS CULTURE W SMEAR

## 2011-08-09 MED ORDER — METOCLOPRAMIDE HCL 10 MG PO TABS: 10.0000 mg | ORAL_TABLET | Freq: Three times a day (TID) | ORAL | Status: DC | PRN

## 2011-08-09 NOTE — Telephone Encounter (Signed)
Refilled

## 2011-08-09 NOTE — Telephone Encounter (Signed)
Refill request received  For metoclopramide 10 mg 1 QID. OK to refill? Not on current med list?

## 2011-08-10 ENCOUNTER — Other Ambulatory Visit: Payer: Self-pay | Admitting: *Deleted

## 2011-08-10 MED ORDER — METOCLOPRAMIDE HCL 10 MG PO TABS: 10.0000 mg | ORAL_TABLET | Freq: Four times a day (QID) | ORAL | Status: DC

## 2011-08-10 NOTE — Telephone Encounter (Signed)
Changed.

## 2011-08-10 NOTE — Telephone Encounter (Signed)
Received faxed form from pharmacy regarding the refill for Metoclopramide 10 mg. Patient says he takes 4 tablets per day and needs a 30 day supply. See previous refill. Please advise

## 2011-08-10 NOTE — Telephone Encounter (Signed)
See prior note.  Refilled yesterday.  plz ensure pharmacy received.

## 2011-08-10 NOTE — Telephone Encounter (Signed)
It was received, but only for TID dosing. He takes it QID, so he needs #120 for a 1 month supply. Ok to change?

## 2011-08-23 ENCOUNTER — Other Ambulatory Visit: Payer: Self-pay | Admitting: *Deleted

## 2011-08-23 MED ORDER — HYDROCODONE-ACETAMINOPHEN 5-500 MG PO TABS: 1.0000 | ORAL_TABLET | Freq: Two times a day (BID) | ORAL | Status: DC | PRN

## 2011-08-23 NOTE — Telephone Encounter (Signed)
OK to refill in Dr. Synthia Innocent absence? Last filled 08/02/11. Please send back to me.

## 2011-08-23 NOTE — Telephone Encounter (Signed)
Please call in and notify Dr. Darnell Level as a FYI.  Thanks.

## 2011-08-24 NOTE — Telephone Encounter (Signed)
Rx called in as directed. Forwarded to Dr. Darnell Level as Juluis Rainier.

## 2011-08-29 ENCOUNTER — Other Ambulatory Visit: Payer: Self-pay | Admitting: *Deleted

## 2011-08-29 MED ORDER — METOPROLOL TARTRATE 25 MG PO TABS: 25.0000 mg | ORAL_TABLET | Freq: Two times a day (BID) | ORAL | Status: DC

## 2011-08-30 DIAGNOSIS — H60509 Unspecified acute noninfective otitis externa, unspecified ear: Secondary | ICD-10-CM | POA: Diagnosis not present

## 2011-08-31 ENCOUNTER — Other Ambulatory Visit: Payer: Self-pay

## 2011-08-31 NOTE — Telephone Encounter (Signed)
pts wife left v/m and I tried to call back and left v/m for pt to call back.

## 2011-08-31 NOTE — Telephone Encounter (Signed)
[facility] request refill glipizide  XL 10 mg with instructions take 1 tab by mouth twice a day. Left v.m for pt to call back to verify if is XL and instructions of how taking.

## 2011-09-01 MED ORDER — GLIPIZIDE 10 MG PO TABS: ORAL_TABLET | ORAL | Status: DC

## 2011-09-01 NOTE — Telephone Encounter (Signed)
Spoke with patient and advised results rx sent to pharmacy by e-script  

## 2011-09-05 ENCOUNTER — Ambulatory Visit: Payer: Medicare Other | Admitting: Family Medicine

## 2011-09-06 ENCOUNTER — Ambulatory Visit (INDEPENDENT_AMBULATORY_CARE_PROVIDER_SITE_OTHER): Payer: Medicare Other | Admitting: Family Medicine

## 2011-09-06 ENCOUNTER — Encounter: Payer: Self-pay | Admitting: Family Medicine

## 2011-09-06 VITALS — BP 118/68 | HR 72 | Temp 97.9°F | Wt 358.8 lb

## 2011-09-06 DIAGNOSIS — Z23 Encounter for immunization: Secondary | ICD-10-CM | POA: Diagnosis not present

## 2011-09-06 DIAGNOSIS — M25569 Pain in unspecified knee: Secondary | ICD-10-CM

## 2011-09-06 DIAGNOSIS — E039 Hypothyroidism, unspecified: Secondary | ICD-10-CM

## 2011-09-06 DIAGNOSIS — E785 Hyperlipidemia, unspecified: Secondary | ICD-10-CM | POA: Diagnosis not present

## 2011-09-06 DIAGNOSIS — K219 Gastro-esophageal reflux disease without esophagitis: Secondary | ICD-10-CM

## 2011-09-06 DIAGNOSIS — M25561 Pain in right knee: Secondary | ICD-10-CM

## 2011-09-06 DIAGNOSIS — K635 Polyp of colon: Secondary | ICD-10-CM

## 2011-09-06 DIAGNOSIS — E119 Type 2 diabetes mellitus without complications: Secondary | ICD-10-CM | POA: Diagnosis not present

## 2011-09-06 DIAGNOSIS — D126 Benign neoplasm of colon, unspecified: Secondary | ICD-10-CM

## 2011-09-06 DIAGNOSIS — I10 Essential (primary) hypertension: Secondary | ICD-10-CM

## 2011-09-06 DIAGNOSIS — H6240 Otitis externa in other diseases classified elsewhere, unspecified ear: Secondary | ICD-10-CM

## 2011-09-06 DIAGNOSIS — B369 Superficial mycosis, unspecified: Secondary | ICD-10-CM

## 2011-09-06 DIAGNOSIS — B368 Other specified superficial mycoses: Secondary | ICD-10-CM

## 2011-09-06 LAB — COMPREHENSIVE METABOLIC PANEL
ALT: 38 U/L (ref 0–53)
Albumin: 4 g/dL (ref 3.5–5.2)
CO2: 24 mEq/L (ref 19–32)
GFR: 64.57 mL/min (ref >60.00)
Glucose, Bld: 218 mg/dL — ABNORMAL HIGH (ref 70–99)
Potassium: 4 mEq/L (ref 3.5–5.1)
Sodium: 142 mEq/L (ref 135–145)
Total Protein: 6.7 g/dL (ref 6.0–8.3)

## 2011-09-06 LAB — TSH: TSH: 3.13 u[IU]/mL (ref 0.35–5.50)

## 2011-09-06 MED ORDER — IBUPROFEN 600 MG PO TABS: 600.0000 mg | ORAL_TABLET | Freq: Four times a day (QID) | ORAL | Status: DC | PRN

## 2011-09-06 NOTE — Patient Instructions (Addendum)
Decrease reglan to 1 in morning with breakfast and 1 in evening with dinner.  Watch for constipation and stomach upset. You will be due for physical in January and likely repeat colonoscopy. Return in January for this. Good to see you today, call us with questions.

## 2011-09-06 NOTE — Assessment & Plan Note (Signed)
Check TSH today

## 2011-09-06 NOTE — Assessment & Plan Note (Signed)
Will discuss at medicare wellness visit and schedule rpt colonoscopy.

## 2011-09-06 NOTE — Progress Notes (Signed)
  Subjective:    Patient ID: Chad [name], male    DOB: December 29, 1955, 56 y.o.   MRN: [id number]  HPI CC: routine f/u DM  Chad [name] presents today with his wife for routine follow up.  Fasting today for blood work.  Recent right medial meniscal tear s/p repair by Dr. Noemi Chapel (06/2011).  On reglan, unclear why.  ?diabetic gastroparesis.  H/o severe GERD s/p multiple EGD in [place], thinks 4 yrs ago.  DM - vision exam 12/2010.  Sugars running ok.  Fasting this morning 161 (cookies last night).  Usually runs 90-120 fasting.  No paresthesias.  Some low sugars to 70s, none below 70. Lab Results  Component Value Date   HGBA1C 5.6 12/28/2010    HTN - No HA, vision changes, CP/tightness, SOB, leg swelling.  Compliant with meds.  HLD - intolerant to statins in past 2/2 muscle aches (lipitor, crestor, zocor).  Doesn't want to try again.  Down to 1 fish oil/day.  Did tolerate red yeast rice well. Lab Results  Component Value Date   CHOL 174 12/28/2010   HDL 43.70 12/28/2010   LDLCALC 111* 12/28/2010   LDLDIRECT 97 08/04/2010   TRIG 95.0 12/28/2010   CHOLHDL 4 12/28/2010   Due for physical in January.  Past Medical History  Diagnosis Date  . Arthritis     mainly in R shoulder, s/p shots  . T2DM (type 2 diabetes mellitus) 1990s  . HTN (hypertension)   . HLD (hyperlipidemia)     statin caused leg cramps  . GERD (gastroesophageal reflux disease)     h/o gastritis and GI bleed  . Seasonal allergies   . Colon polyp 2008  . Hypothyroid   . Hearing impairment     hearing aides  . Gastric bypass status for obesity   . OSA (obstructive sleep apnea)     unable to use CPAP as of last try 2/2 h/o tracheostomy?  . Shortness of breath     Review of Systems Per HPI    Objective:   Physical Exam  Nursing note and vitals reviewed. Constitutional: He appears well-developed and well-nourished. No distress.  HENT:  Head: Normocephalic and atraumatic.  Right Ear: Tympanic membrane, external ear  and ear canal normal. Decreased hearing is noted.  Left Ear: Tympanic membrane, external ear and ear canal normal. Decreased hearing is noted.  Nose: Nose normal.  Mouth/Throat: Oropharynx is clear and moist. No oropharyngeal exudate.  Eyes: Conjunctivae and EOM are normal. Pupils are equal, round, and reactive to light. No scleral icterus.  Neck: Normal range of motion. Neck supple. Carotid bruit is not present.  Cardiovascular: Normal rate, regular rhythm, normal heart sounds and intact distal pulses.   No murmur heard. Pulmonary/Chest: Effort normal and breath sounds normal. No respiratory distress. He has no wheezes. He has no rales.  Musculoskeletal: He exhibits no edema.       Diabetic foot exam: Normal inspection No skin breakdown Early calluses present Normal DP/PT pulses Normal sensation to light touch and monofilament Nails normal  Lymphadenopathy:    He has no cervical adenopathy.  Skin: Skin is warm and dry. No rash noted.  Psychiatric: He has a normal mood and affect.      Assessment & Plan:

## 2011-09-06 NOTE — Assessment & Plan Note (Signed)
S/p meniscal repair, recuperating well from surgery.

## 2011-09-06 NOTE — Addendum Note (Signed)
Addended by: Royann Shivers A on: 09/06/2011 08:49 AM   Modules accepted: Orders

## 2011-09-06 NOTE — Assessment & Plan Note (Signed)
Severe. On nexium bid as well as reglan 2 in am and 1 in pm (34m). Discussed backing down on reglan to 18min am and see if sxs as well controlled.

## 2011-09-06 NOTE — Assessment & Plan Note (Signed)
Will be due for eye exam this November. Chronic, stable. Continue meds.  Check blood work today - if staying well controlled, back off actos as has been on this med for several years (unsure how many). Foot exam today. Pneumonia shot today.

## 2011-09-06 NOTE — Assessment & Plan Note (Signed)
Healed well.

## 2011-09-06 NOTE — Assessment & Plan Note (Signed)
Chronic, stable. Continue meds.

## 2011-09-06 NOTE — Assessment & Plan Note (Signed)
Chronic. Check FLP today.  Intolerant of statins in past.

## 2011-09-07 ENCOUNTER — Other Ambulatory Visit: Payer: Self-pay | Admitting: Family Medicine

## 2011-09-07 MED ORDER — RED YEAST RICE 600 MG PO CAPS: 1.0000 | ORAL_CAPSULE | Freq: Every day | ORAL | Status: DC

## 2011-09-19 ENCOUNTER — Telehealth: Payer: Self-pay

## 2011-09-19 NOTE — Telephone Encounter (Signed)
Already faxed back today.  Discarded second request.

## 2011-09-19 NOTE — Telephone Encounter (Signed)
[facility] is requesting a new order on file for Diabetic supplies. The other physician order that was on file expired. Form is in your box.

## 2011-09-20 ENCOUNTER — Other Ambulatory Visit: Payer: Self-pay | Admitting: *Deleted

## 2011-09-20 NOTE — Telephone Encounter (Signed)
Received faxed refill request for HCTZ and Glipizide. The request for Glipizide XL 10 mg, take one tablet by mouth twice a day does not match medication list. Please advise.

## 2011-09-21 MED ORDER — HYDROCHLOROTHIAZIDE 25 MG PO TABS: 25.0000 mg | ORAL_TABLET | Freq: Every day | ORAL | Status: DC

## 2011-09-21 MED ORDER — GLIPIZIDE 10 MG PO TABS: ORAL_TABLET | ORAL | Status: DC

## 2011-09-21 NOTE — Telephone Encounter (Signed)
Sent in

## 2011-09-26 DIAGNOSIS — M25569 Pain in unspecified knee: Secondary | ICD-10-CM | POA: Diagnosis not present

## 2011-09-29 ENCOUNTER — Telehealth: Payer: Self-pay | Admitting: *Deleted

## 2011-09-29 NOTE — Telephone Encounter (Signed)
Spoke with patient's wife and since patient has gone to 1/2 of glipizide in the morning and a whole pill at night, his sugars have ben over 200 every morning. They were controlled well on the XL pill BID. They wanted you to be aware.

## 2011-09-30 MED ORDER — GLIPIZIDE 10 MG PO TABS: 10.0000 mg | ORAL_TABLET | Freq: Two times a day (BID) | ORAL | Status: DC

## 2011-09-30 NOTE — Telephone Encounter (Signed)
Message left notifying patient.

## 2011-09-30 NOTE — Telephone Encounter (Signed)
We had decreased am glipizide 2/2 lows during day, and low stopped. Given increased sugars recently, will increase AM glipizide to 1 pill twice daily - sent in new dose.  Watch for lows

## 2011-10-06 ENCOUNTER — Ambulatory Visit (INDEPENDENT_AMBULATORY_CARE_PROVIDER_SITE_OTHER): Payer: Medicare Other | Admitting: Family Medicine

## 2011-10-06 ENCOUNTER — Encounter: Payer: Self-pay | Admitting: Family Medicine

## 2011-10-06 VITALS — BP 134/64 | HR 88 | Temp 97.8°F | Wt 362.8 lb

## 2011-10-06 DIAGNOSIS — I440 Atrioventricular block, first degree: Secondary | ICD-10-CM

## 2011-10-06 DIAGNOSIS — R079 Chest pain, unspecified: Secondary | ICD-10-CM | POA: Insufficient documentation

## 2011-10-06 MED ORDER — SUCRALFATE 1 GM/10ML PO SUSP: 1.0000 g | Freq: Three times a day (TID) | ORAL | Status: DC

## 2011-10-06 NOTE — Assessment & Plan Note (Addendum)
Overall atypical, anticipate due to indigestion/dyspepsia related as soreness, belching did improve some with GI cocktail. Did have moderate CAD by catheterization 04/2010 but chest pain was not attributed to this in past. Significant risk factors include obesity, HTN, HLD, T2DM, and fmhx CAD.  Seeing cards 11/2011. Will treat as GI (gastritis vs GERD/indigestion) with continued PPI (nexium bid), start ranitidine and gas x and may use sucralfate PRN. If this is not improving dyspepsia/indigestion, discussed referral to GI for possible rpt EGD.  Requested records of prior EGD today. If any worsening chest pain/tightness, knows to seek urgent care at ER.  EKG - RBB, R axis deviation, prolonged PR, overall unchanged compared to EKG 12/2010, no acute ST/T changes
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
## 2011-10-06 NOTE — Assessment & Plan Note (Signed)
H/o this in past as well, currently on B blocker for h/o HTN and CAD, consider decreasing dose.

## 2011-10-06 NOTE — Patient Instructions (Addendum)
I think this is coming from the stomach and reflux.  Add on carafate up to four times daily with meals as needed.  If this isn't helping, let me know to refer you to stomach doctor for possible endoscopy. If chest pain returning - especially if any chest pressure/tightness worse with exercise, please return here or go to ER to be evaluated. I'm sorry you're not feeling well. Good to see you today. We've changed glipizide to XL 61m twice daily.

## 2011-10-07 ENCOUNTER — Telehealth: Payer: Self-pay

## 2011-10-07 NOTE — Telephone Encounter (Signed)
Changed and placed in Kim's box.

## 2011-10-07 NOTE — Telephone Encounter (Signed)
Patient states he tests 2-3 times daily. Form placed in your IN box for correction.

## 2011-10-07 NOTE — Telephone Encounter (Signed)
To: [facility] (After Hours Triage) Fax: [phone] From: [facility] Date/ Time: 10/06/2011 2:00 PM Taken By: Roger Kill, CSR Caller: Celainnie Facility: not collected Patient: Chad, [name] DOB: [birth year] Phone: [phone] Reason for Call: Celainnie calling from the [facility] regarding patients diabetic supplies. Ref # [id number] Regarding Appointment: Appt Date: Appt Time: Unknown Provider: Reason: Details: Outcome:

## 2011-10-10 ENCOUNTER — Other Ambulatory Visit: Payer: Self-pay | Admitting: *Deleted

## 2011-10-10 DIAGNOSIS — M7650 Patellar tendinitis, unspecified knee: Secondary | ICD-10-CM | POA: Diagnosis not present

## 2011-10-10 MED ORDER — GLIPIZIDE ER 10 MG PO TB24: 10.0000 mg | ORAL_TABLET | Freq: Two times a day (BID) | ORAL | Status: DC

## 2011-10-10 NOTE — Telephone Encounter (Signed)
Patient only wants xl is it okay to take this 2 times daily

## 2011-10-10 NOTE — Telephone Encounter (Signed)
Nikki at [facility] notified as instructed by telephone.

## 2011-10-10 NOTE — Telephone Encounter (Signed)
Yes ok to do. Thanks.

## 2011-10-13 NOTE — Telephone Encounter (Signed)
Corrected form faxed.

## 2011-10-19 ENCOUNTER — Telehealth: Payer: Self-pay

## 2011-10-19 NOTE — Telephone Encounter (Signed)
Signed and placed in Kim's box.

## 2011-10-19 NOTE — Telephone Encounter (Signed)
Chad [name] with [employer] received diabetic supply form but step 3 had white out and insurance will not allow white out corrections on form. New form in Dr Gutierrez's in box for signature.

## 2011-10-25 ENCOUNTER — Other Ambulatory Visit: Payer: Self-pay | Admitting: *Deleted

## 2011-10-25 MED ORDER — METFORMIN HCL 1000 MG PO TABS: 1000.0000 mg | ORAL_TABLET | Freq: Two times a day (BID) | ORAL | Status: DC

## 2011-10-25 NOTE — Telephone Encounter (Signed)
Form faxed

## 2011-10-26 ENCOUNTER — Other Ambulatory Visit: Payer: Self-pay | Admitting: *Deleted

## 2011-10-26 MED ORDER — OMEGA-3-ACID ETHYL ESTERS 1 G PO CAPS: 2.0000 g | ORAL_CAPSULE | Freq: Two times a day (BID) | ORAL | Status: DC

## 2011-10-26 NOTE — Telephone Encounter (Signed)
rx request from pharmacy is for Lovaza 1000 mg 4 caps bid # 240, med list has 1000 mg bid, but pt states he is taking 1000 mg 2 caps bid. Please advise which dose you want filled?

## 2011-10-26 NOTE — Telephone Encounter (Signed)
Patient notified and verbalized understanding. 

## 2011-10-26 NOTE — Telephone Encounter (Signed)
Will do 1044m 2 caps bid.  Sent in.  plz notify pt.

## 2011-11-03 ENCOUNTER — Ambulatory Visit (INDEPENDENT_AMBULATORY_CARE_PROVIDER_SITE_OTHER): Payer: Medicare Other | Admitting: Family Medicine

## 2011-11-03 ENCOUNTER — Encounter: Payer: Self-pay | Admitting: Family Medicine

## 2011-11-03 VITALS — BP 132/92 | HR 64 | Temp 98.2°F | Wt 372.2 lb

## 2011-11-03 DIAGNOSIS — J029 Acute pharyngitis, unspecified: Secondary | ICD-10-CM

## 2011-11-03 NOTE — Assessment & Plan Note (Signed)
With ulcer on soft palate, anticipate viral process. Discussed with pt. Treat with continued chloraseptic spray. Will take more time to heal.  Supportive measures as per instructions.

## 2011-11-03 NOTE — Patient Instructions (Signed)
You have viral pharyngitis. Push fluids and plenty of rest. Salt water gargles. Suck on cold things like popsicles or warm things like herbal teas (whichever soothes the throat better). Continue chloraseptic spray. Return if fever >101.5, worsening pain, or trouble opening/closing mouth, or hoarse voice. Good to see you today, call clinic with questions.

## 2011-11-03 NOTE — Progress Notes (Signed)
  Subjective:    Patient ID: Chad [name], male    DOB: [birth year], 56 y.o.   MRN: [id number]  HPI CC: ST, HA  1 wk h/o decreased energy, ST on left, pressure HA worse in am.  No change in cough.  Some nausea as well.  Using chloraseptic spray which helps pain.  No fevers/chills, abd pain, vomiting, ear or tooth pain.  No new rashes.  H/o fungal external infection.  No sick contacts at home.  No smokers at home.   Wt Readings from Last 3 Encounters:  11/03/11 372 lb 4 oz (168.851 kg)  10/06/11 362 lb 12 oz (164.542 kg)  09/06/11 358 lb 12 oz (162.728 kg)   gaining weight - 2/2 not walking 2/2 fatigue.  Knee pain improving.  Using what sounds like topical NSAID.  Review of Systems Per HPI    Objective:   Physical Exam  Nursing note and vitals reviewed. Constitutional: He appears well-developed and well-nourished. No distress.       obese  HENT:  Head: Normocephalic and atraumatic.  Right Ear: Hearing, tympanic membrane, external ear and ear canal normal.  Left Ear: Hearing, tympanic membrane, external ear and ear canal normal.  Nose: Nose normal. No mucosal edema or rhinorrhea. Right sinus exhibits no maxillary sinus tenderness and no frontal sinus tenderness. Left sinus exhibits no maxillary sinus tenderness and no frontal sinus tenderness.  Mouth/Throat: Uvula is midline and mucous membranes are normal. Posterior oropharyngeal edema and posterior oropharyngeal erythema present. No oropharyngeal exudate or tonsillar abscesses.       Large ulcer on left soft palate with surrounding erythema  Eyes: Conjunctivae normal and EOM are normal. Pupils are equal, round, and reactive to light. No scleral icterus.  Neck: Normal range of motion. Neck supple.  Cardiovascular: Normal rate, regular rhythm, normal heart sounds and intact distal pulses.   No murmur heard. Pulmonary/Chest: Effort normal and breath sounds normal. No respiratory distress. He has no wheezes. He has no rales.    Lymphadenopathy:    He has no cervical adenopathy.  Skin: Skin is warm and dry. No rash noted.       Assessment & Plan:

## 2011-11-15 ENCOUNTER — Ambulatory Visit (INDEPENDENT_AMBULATORY_CARE_PROVIDER_SITE_OTHER): Payer: Medicare Other

## 2011-11-15 DIAGNOSIS — Z23 Encounter for immunization: Secondary | ICD-10-CM | POA: Diagnosis not present

## 2011-11-24 ENCOUNTER — Other Ambulatory Visit: Payer: Self-pay | Admitting: *Deleted

## 2011-11-24 MED ORDER — PIOGLITAZONE HCL 30 MG PO TABS: 30.0000 mg | ORAL_TABLET | Freq: Every day | ORAL | Status: DC

## 2011-11-24 MED ORDER — LISINOPRIL 40 MG PO TABS: 40.0000 mg | ORAL_TABLET | Freq: Every day | ORAL | Status: DC

## 2011-11-24 NOTE — Addendum Note (Signed)
Addended by: Marchia Bond on: 11/24/2011 10:22 AM   Modules accepted: Orders

## 2011-12-19 ENCOUNTER — Ambulatory Visit (INDEPENDENT_AMBULATORY_CARE_PROVIDER_SITE_OTHER): Payer: Medicare Other | Admitting: Family Medicine

## 2011-12-19 ENCOUNTER — Encounter: Payer: Self-pay | Admitting: Family Medicine

## 2011-12-19 VITALS — BP 126/84 | HR 66 | Temp 97.9°F | Wt 373.8 lb

## 2011-12-19 DIAGNOSIS — J40 Bronchitis, not specified as acute or chronic: Secondary | ICD-10-CM

## 2011-12-19 DIAGNOSIS — J209 Acute bronchitis, unspecified: Secondary | ICD-10-CM | POA: Insufficient documentation

## 2011-12-19 MED ORDER — HYDROCODONE-HOMATROPINE 5-1.5 MG/5ML PO SYRP: 5.0000 mL | ORAL_SOLUTION | Freq: Every evening | ORAL | Status: DC | PRN

## 2011-12-19 MED ORDER — AZITHROMYCIN 250 MG PO TABS: ORAL_TABLET | ORAL | Status: DC

## 2011-12-19 NOTE — Patient Instructions (Signed)
I think you may be developing bronchitis - treat with cough syrup at night time (hydrocodone cough syrup). If any worsening or not improving as expected ,fill zpack (sent to pharmacy) Lots of fluids and plenty of rest over next several days. Good to see you today, call us with questions.

## 2011-12-19 NOTE — Assessment & Plan Note (Addendum)
Anticipate viral given short duration.  Given comorbidities, provided with WASP script for zpack. If not improving as expected, fill abx.   Discussed red flags to return for further evaluation. Supportive care as per instructions.

## 2011-12-19 NOTE — Progress Notes (Signed)
  Subjective:    Patient ID: Chad [name], male    DOB: Aug 18, 1955, 56 y.o.   MRN: [id number]  HPI CC: cough  Pleasant 56 yo pt of mine with h/o obesity, GERD, DM, HTN, HLD, OSA and hypothyroid presents with 3d h/o cough worse at night, ST, sinus congestion worse at night, chest sore and burning with coughing.  + chills last night along with cough.  Cough productive of yellow sputum.  Feeling some nausea today.  + PNdrainage and ST.  Appetite ok.  Tried nyquil but didn't help.  No ear or tooth pain, rashes, abd pain.  No sick contacts at home.  No smoker at home.  No h/o asthma, COPD.  Using wedge to sleep, less snoring, OSA doing well.  Wt Readings from Last 3 Encounters:  12/19/11 373 lb 12 oz (169.532 kg)  11/03/11 372 lb 4 oz (168.851 kg)  10/06/11 362 lb 12 oz (164.542 kg)    Past Medical History  Diagnosis Date  . Arthritis     mainly in R shoulder, s/p shots  . T2DM (type 2 diabetes mellitus) 1990s  . HTN (hypertension)   . HLD (hyperlipidemia)     statin caused leg cramps  . GERD (gastroesophageal reflux disease)     severe, h/o gastritis and GI bleed, per pt normal EGD at [facility] 2008  . Seasonal allergies   . Colon polyp 2008  . Hypothyroid   . Hearing impairment     hearing aides  . Gastric bypass status for obesity 1985  . OSA (obstructive sleep apnea)     unable to use CPAP as of last try 2/2 h/o tracheostomy?  . Morbid obesity   . RBBB     Review of Systems Per HPI    Objective:   Physical Exam  Nursing note and vitals reviewed. Constitutional: He appears well-developed and well-nourished. No distress.  HENT:  Head: Normocephalic and atraumatic.  Right Ear: Hearing, tympanic membrane, external ear and ear canal normal.  Left Ear: Hearing, tympanic membrane, external ear and ear canal normal.  Nose: Nose normal. No mucosal edema or rhinorrhea. Right sinus exhibits no maxillary sinus tenderness and no frontal sinus tenderness. Left sinus exhibits no  maxillary sinus tenderness and no frontal sinus tenderness.  Mouth/Throat: Uvula is midline, oropharynx is clear and moist and mucous membranes are normal. No oropharyngeal exudate, posterior oropharyngeal edema, posterior oropharyngeal erythema or tonsillar abscesses.       Congested fluid behind TMs bilaterally  Eyes: Conjunctivae normal and EOM are normal. Pupils are equal, round, and reactive to light. No scleral icterus.  Neck: Normal range of motion. Neck supple.  Cardiovascular: Normal rate, regular rhythm, normal heart sounds and intact distal pulses.   No murmur heard. Pulmonary/Chest: Effort normal and breath sounds normal. No respiratory distress. He has no wheezes. He has no rales.  Lymphadenopathy:    He has no cervical adenopathy.  Skin: Skin is warm and dry. No rash noted.       Assessment & Plan:

## 2011-12-23 ENCOUNTER — Other Ambulatory Visit: Payer: Self-pay | Admitting: *Deleted

## 2011-12-23 MED ORDER — METOPROLOL TARTRATE 25 MG PO TABS: 25.0000 mg | ORAL_TABLET | Freq: Two times a day (BID) | ORAL | Status: DC

## 2011-12-23 MED ORDER — LEVOTHYROXINE SODIUM 100 MCG PO TABS: 100.0000 ug | ORAL_TABLET | Freq: Every day | ORAL | Status: DC

## 2012-01-02 ENCOUNTER — Other Ambulatory Visit: Payer: Self-pay | Admitting: *Deleted

## 2012-01-02 MED ORDER — IBUPROFEN 600 MG PO TABS: 600.0000 mg | ORAL_TABLET | Freq: Four times a day (QID) | ORAL | Status: DC | PRN

## 2012-01-09 ENCOUNTER — Other Ambulatory Visit: Payer: Self-pay | Admitting: Orthopedic Surgery

## 2012-01-09 DIAGNOSIS — M549 Dorsalgia, unspecified: Secondary | ICD-10-CM

## 2012-01-09 DIAGNOSIS — IMO0002 Reserved for concepts with insufficient information to code with codable children: Secondary | ICD-10-CM | POA: Diagnosis not present

## 2012-01-09 DIAGNOSIS — M25559 Pain in unspecified hip: Secondary | ICD-10-CM | POA: Diagnosis not present

## 2012-01-09 DIAGNOSIS — M25569 Pain in unspecified knee: Secondary | ICD-10-CM | POA: Diagnosis not present

## 2012-01-10 ENCOUNTER — Other Ambulatory Visit: Payer: Self-pay | Admitting: *Deleted

## 2012-01-10 MED ORDER — HYDROCODONE-ACETAMINOPHEN 5-500 MG PO TABS: 1.0000 | ORAL_TABLET | Freq: Two times a day (BID) | ORAL | Status: DC | PRN

## 2012-01-10 NOTE — Telephone Encounter (Signed)
Ok to refill 

## 2012-01-10 NOTE — Telephone Encounter (Signed)
plz phone in. 

## 2012-01-11 NOTE — Telephone Encounter (Signed)
Rx called in as directed.   

## 2012-01-12 ENCOUNTER — Ambulatory Visit
Admission: RE | Admit: 2012-01-12 | Discharge: 2012-01-12 | Disposition: A | Discharge status: Home / Self Care | Payer: Medicare Other | Source: Ambulatory Visit | Attending: Orthopedic Surgery | Admitting: Orthopedic Surgery

## 2012-01-12 DIAGNOSIS — M5126 Other intervertebral disc displacement, lumbar region: Secondary | ICD-10-CM | POA: Diagnosis not present

## 2012-01-12 DIAGNOSIS — M549 Dorsalgia, unspecified: Secondary | ICD-10-CM

## 2012-01-12 DIAGNOSIS — M47817 Spondylosis without myelopathy or radiculopathy, lumbosacral region: Secondary | ICD-10-CM | POA: Diagnosis not present

## 2012-01-12 DIAGNOSIS — M5137 Other intervertebral disc degeneration, lumbosacral region: Secondary | ICD-10-CM | POA: Diagnosis not present

## 2012-01-23 ENCOUNTER — Other Ambulatory Visit: Payer: Self-pay | Admitting: *Deleted

## 2012-01-23 NOTE — Telephone Encounter (Signed)
plz notify pt I recommend only taking 1 tablet bid (unless he's been advised by another MD to take 2 in am and 1 in pm) as this medicine has possibility of causing bad side effects.  We can further discuss at next appt.  plz send in after talk to pt.

## 2012-01-23 NOTE — Telephone Encounter (Signed)
Received refill request for Reglan with instructions to take 1 tab tid prn, we have different direction on file and when I verified sig with pt he states he is taking 2 tabs in am, and 1 tab in pm daily. Is it ok to change sig and refill as pt currently takes?

## 2012-01-26 DIAGNOSIS — M48062 Spinal stenosis, lumbar region with neurogenic claudication: Secondary | ICD-10-CM | POA: Diagnosis not present

## 2012-01-26 DIAGNOSIS — M549 Dorsalgia, unspecified: Secondary | ICD-10-CM | POA: Diagnosis not present

## 2012-01-26 DIAGNOSIS — M79609 Pain in unspecified limb: Secondary | ICD-10-CM | POA: Diagnosis not present

## 2012-01-30 MED ORDER — METOCLOPRAMIDE HCL 10 MG PO TABS: 10.0000 mg | ORAL_TABLET | Freq: Two times a day (BID) | ORAL | Status: DC

## 2012-01-30 NOTE — Telephone Encounter (Signed)
Patient notified and said his bowels will not move without taking it TID prn. He is aware it can cause side effects and will discuss it with you in at his January appt. Med refilled.

## 2012-02-02 ENCOUNTER — Encounter: Payer: Self-pay | Admitting: Physical Medicine and Rehabilitation

## 2012-02-02 DIAGNOSIS — M549 Dorsalgia, unspecified: Secondary | ICD-10-CM | POA: Diagnosis not present

## 2012-02-02 DIAGNOSIS — IMO0001 Reserved for inherently not codable concepts without codable children: Secondary | ICD-10-CM | POA: Diagnosis not present

## 2012-02-02 DIAGNOSIS — M79609 Pain in unspecified limb: Secondary | ICD-10-CM | POA: Diagnosis not present

## 2012-02-17 ENCOUNTER — Other Ambulatory Visit: Payer: Self-pay | Admitting: *Deleted

## 2012-02-17 MED ORDER — METOCLOPRAMIDE HCL 10 MG PO TABS: 10.0000 mg | ORAL_TABLET | Freq: Two times a day (BID) | ORAL | Status: DC

## 2012-02-17 NOTE — Telephone Encounter (Signed)
Patient is to talk with Dr. Darnell Level about this at his appt in January. He needs med sometimes TID to help his bowels move. He is aware of the potential negative side effects. Follow up with Dr. Darnell Level on 03/08/12.

## 2012-02-17 NOTE — Telephone Encounter (Signed)
Faxed refill request.  Last filled 01/30/12.  Please advise.

## 2012-02-17 NOTE — Telephone Encounter (Signed)
rx sent to pharmacy by e-script Left message on cell phone to have patient return my call. Called home number and spoke with wife and she states patient is sleeping and will call us back when he gets up. Will forward message to Kim/ Dr.G

## 2012-02-17 NOTE — Telephone Encounter (Signed)
Okay # 60 x [phone] Please let him know that Dr Darnell Level has asked him to cut back on this to see if he still needed it  It seems like he must be using even more  Send to Dr Darnell Level after noting his response---so he can address this

## 2012-02-19 NOTE — Telephone Encounter (Signed)
Noted. Will discuss at [place].

## 2012-02-20 ENCOUNTER — Other Ambulatory Visit: Payer: Self-pay | Admitting: *Deleted

## 2012-02-20 MED ORDER — ESOMEPRAZOLE MAGNESIUM 40 MG PO CPDR: 40.0000 mg | DELAYED_RELEASE_CAPSULE | Freq: Two times a day (BID) | ORAL | Status: DC

## 2012-02-22 ENCOUNTER — Encounter: Payer: Self-pay | Admitting: Physical Medicine and Rehabilitation

## 2012-02-22 DIAGNOSIS — IMO0001 Reserved for inherently not codable concepts without codable children: Secondary | ICD-10-CM | POA: Diagnosis not present

## 2012-02-22 DIAGNOSIS — M79609 Pain in unspecified limb: Secondary | ICD-10-CM | POA: Diagnosis not present

## 2012-02-22 DIAGNOSIS — M549 Dorsalgia, unspecified: Secondary | ICD-10-CM | POA: Diagnosis not present

## 2012-02-23 ENCOUNTER — Other Ambulatory Visit: Payer: Self-pay | Admitting: *Deleted

## 2012-02-23 MED ORDER — METFORMIN HCL 1000 MG PO TABS: 1000.0000 mg | ORAL_TABLET | Freq: Two times a day (BID) | ORAL | Status: DC

## 2012-02-27 ENCOUNTER — Other Ambulatory Visit: Payer: Self-pay | Admitting: *Deleted

## 2012-02-27 MED ORDER — HYDROCODONE-ACETAMINOPHEN 5-500 MG PO TABS: 1.0000 | ORAL_TABLET | Freq: Two times a day (BID) | ORAL | Status: DC | PRN

## 2012-02-27 NOTE — Telephone Encounter (Signed)
plz phone in. 

## 2012-02-27 NOTE — Telephone Encounter (Signed)
Ok to refill 

## 2012-02-28 DIAGNOSIS — M549 Dorsalgia, unspecified: Secondary | ICD-10-CM | POA: Diagnosis not present

## 2012-02-28 DIAGNOSIS — IMO0001 Reserved for inherently not codable concepts without codable children: Secondary | ICD-10-CM | POA: Diagnosis not present

## 2012-02-28 DIAGNOSIS — M79609 Pain in unspecified limb: Secondary | ICD-10-CM | POA: Diagnosis not present

## 2012-02-28 NOTE — Telephone Encounter (Signed)
Rx called in as directed.   

## 2012-03-01 ENCOUNTER — Ambulatory Visit (INDEPENDENT_AMBULATORY_CARE_PROVIDER_SITE_OTHER): Payer: Medicare Other | Admitting: Cardiovascular Disease

## 2012-03-01 ENCOUNTER — Encounter: Payer: Self-pay | Admitting: Cardiovascular Disease

## 2012-03-01 VITALS — BP 160/90 | HR 88 | Ht 69.0 in | Wt 375.0 lb

## 2012-03-01 DIAGNOSIS — R0989 Other specified symptoms and signs involving the circulatory and respiratory systems: Secondary | ICD-10-CM

## 2012-03-01 DIAGNOSIS — R0609 Other forms of dyspnea: Secondary | ICD-10-CM

## 2012-03-01 DIAGNOSIS — R600 Localized edema: Secondary | ICD-10-CM | POA: Insufficient documentation

## 2012-03-01 DIAGNOSIS — R609 Edema, unspecified: Secondary | ICD-10-CM | POA: Diagnosis not present

## 2012-03-01 DIAGNOSIS — E669 Obesity, unspecified: Secondary | ICD-10-CM

## 2012-03-01 DIAGNOSIS — R0600 Dyspnea, unspecified: Secondary | ICD-10-CM

## 2012-03-01 DIAGNOSIS — I10 Essential (primary) hypertension: Secondary | ICD-10-CM

## 2012-03-01 DIAGNOSIS — R0789 Other chest pain: Secondary | ICD-10-CM

## 2012-03-01 MED ORDER — FUROSEMIDE 20 MG PO TABS: 20.0000 mg | ORAL_TABLET | Freq: Two times a day (BID) | ORAL | Status: DC | PRN

## 2012-03-01 MED ORDER — POTASSIUM CHLORIDE ER 10 MEQ PO TBCR: 10.0000 meq | EXTENDED_RELEASE_TABLET | Freq: Two times a day (BID) | ORAL | Status: DC | PRN

## 2012-03-01 NOTE — Assessment & Plan Note (Addendum)
Blood pressure is elevated. This could be secondary to fluid retention. We'll closely monitor his blood pressure after diuresis.

## 2012-03-01 NOTE — Assessment & Plan Note (Signed)
Worsening edema over the past 6 months, now with ulcer of the left lower extremity likely from skin breakdown. Initially started as a blister. We'll start Lasix With potassium and HCTZ in the morning. We have asked him to cut back on his fluid intake. We have suggested he could take Lasix twice a day with potassium twice a day for several days. I've asked them to closely monitor his weight. He has followup with Dr. Danise Mina in several weeks' time with lab work prior to the visit. We'll monitor his renal function and back down on a diuretic if needed for rising creatinine/BUN..  Lower extremity could also be secondary to him having his legs down for most of the day and venous insufficiency. Given his significant weight gain of 15 pounds, his underlying obesity and obstructive sleep apnea that is not treated, I'm concerned about diastolic CHF. The leg ulcer will likely not heal without improving the edema and we also recommended he use a Ace wrap when his legs are down.

## 2012-03-01 NOTE — Patient Instructions (Addendum)
For edema, take lasix one in the morning with potassium. Ok to take an extra after lunch if no improvement of the swelling  (with potassium)  Alternate wrapping the legs   Lab work in 2 weeks   Please call us if you have new issues that need to be addressed before your next appt.  Your physician wants you to follow-up in: 1 months.

## 2012-03-01 NOTE — Assessment & Plan Note (Signed)
Despite gastric bypass, his weight continues to be a major issue. We have encouraged continued exercise, careful diet management in an effort to lose weight.

## 2012-03-01 NOTE — Assessment & Plan Note (Signed)
Will start on diuretics once a day if not twice a day. Recent weight gain concerning for diastolic CHF.

## 2012-03-01 NOTE — Progress Notes (Addendum)
Patient ID: Chad [name], male    DOB: [birth year], 57 y.o.   MRN: [id number]  HPI Comments: Chad [name] is a very pleasant 57 year old gentleman with a history of morbid obesity, history of gastric bypass, obstructive sleep apnea who does not wear CPAP, history of throat surgery for sleep apnea though the details are unavailable, history of GERD who sleeps on a wedge who initially presented for abnormal EKG. He had a cardiac catheterization in March of 2012 showing mild disease, moderate calcifications with no intervention needed.  He continues to have problems with his sleep. He recently increased the elevation of his wedge that he sleeps on. Since then he has had worsening neck pain and tingling in his left upper shoulder and neck.   He is worsening back pain and because of this has been having pain when he raises his legs in the recliner. He has had his legs down for longer periods of time. Over the past 6 months, has had worsening lower extremity edema. He is developed and an area of skin breakdown of the left lower extremity the size of a quarter. His wife has been treating it with a wrap and Neosporin. Weight has been increasing, up 15-20 pounds.  In the past, he was not able to tolerate CPAP and returned the machine many years ago. He is not interested in having any repeat testing.  He has tried cholesterol medications in the past including Lipitor and he reports it has caused profound cramping in his legs to the point where he has gone to the hospital 3 times.  EKG shows normal sinus rhythm with rate 80 beats per minute with frequent PVCs and trigeminal pattern, right bundle branch block  Outpatient Encounter Prescriptions as of 03/01/2012  Medication Sig Dispense Refill  . aspirin EC 81 MG tablet Take 162 mg by mouth daily.        Marland Kitchen esomeprazole (NEXIUM) 40 MG capsule Take 1 capsule (40 mg total) by mouth 2 (two) times daily.  60 capsule  6  . fish oil-omega-3 fatty acids 1000 MG capsule  Take 1 g by mouth daily.      . Flaxseed, Linseed, (FLAX SEED OIL) 1000 MG CAPS Take 1 capsule by mouth 2 (two) times daily.        Marland Kitchen glipiZIDE (GLUCOTROL XL) 10 MG 24 hr tablet Take 1 tablet (10 mg total) by mouth 2 (two) times daily.  60 tablet  11  . hydrochlorothiazide (HYDRODIURIL) 25 MG tablet Take 1 tablet (25 mg total) by mouth daily.  90 tablet  3  . HYDROcodone-acetaminophen (VICODIN) 5-500 MG per tablet Take 1 tablet by mouth 2 (two) times daily as needed for pain.  60 tablet  0  . ibuprofen (ADVIL,MOTRIN) 600 MG tablet Take 1 tablet (600 mg total) by mouth every 6 (six) hours as needed for pain. For pain  30 tablet  0  . levothyroxine (SYNTHROID, LEVOTHROID) 100 MCG tablet Take 1 tablet (100 mcg total) by mouth daily.  30 tablet  6  . lisinopril (PRINIVIL,ZESTRIL) 40 MG tablet Take 1 tablet (40 mg total) by mouth daily.  30 tablet  3  . metFORMIN (GLUCOPHAGE) 1000 MG tablet Take 1-1.5 tablets (1,000-1,500 mg total) by mouth 2 (two) times daily with a meal. Take 1 tablet in the AM and 1.5 tablet in the PM  75 tablet  6  . metoCLOPramide (REGLAN) 10 MG tablet Take 1 tablet (10 mg total) by mouth 2 (two) times daily.  60 tablet  0  . metoprolol tartrate (LOPRESSOR) 25 MG tablet Take 1 tablet (25 mg total) by mouth 2 (two) times daily.  60 tablet  6  . Multiple Vitamin (MULITIVITAMIN WITH MINERALS) TABS Take 1 tablet by mouth daily.      Marland Kitchen omega-3 acid ethyl esters (LOVAZA) 1 G capsule Take 2 capsules (2 g total) by mouth 2 (two) times daily.  120 capsule  11  . pioglitazone (ACTOS) 30 MG tablet Take 1 tablet (30 mg total) by mouth daily.  30 tablet  3  . Red Yeast Rice 600 MG CAPS Take 1 capsule (600 mg total) by mouth daily.      . sucralfate (CARAFATE) 1 GM/10ML suspension Take 10 mLs (1 g total) by mouth 4 (four) times daily -  with meals and at bedtime. As needed  420 mL  0  . [DISCONTINUED] azithromycin (ZITHROMAX Z-PAK) 250 MG tablet Two on day 1 followed by one daily for 4 days for  total of 5 days, PO  6 each  0  . [DISCONTINUED] HYDROcodone-homatropine (HYCODAN) 5-1.5 MG/5ML syrup Take 5 mLs by mouth at bedtime as needed for cough.  120 mL  0     Review of Systems  Constitutional: Negative.   HENT: Negative.   Eyes: Negative.   Respiratory: Positive for shortness of breath.   Cardiovascular: Positive for leg swelling.  Gastrointestinal: Negative.   Musculoskeletal: Positive for arthralgias.  Skin: Negative.   Neurological: Negative.   Hematological: Negative.   Psychiatric/Behavioral: Negative.   All other systems reviewed and are negative.    BP 160/90  Pulse 88  Ht 5' 9"  (1.753 m)  Wt 375 lb (170.099 kg)  BMI 55.38 kg/m2  Physical Exam  Nursing note and vitals reviewed. Constitutional: He is oriented to person, place, and time. He appears well-developed and well-nourished.       obese  HENT:  Head: Normocephalic.  Nose: Nose normal.  Mouth/Throat: Oropharynx is clear and moist.  Eyes: Conjunctivae normal are normal. Pupils are equal, round, and reactive to light.  Neck: Normal range of motion. Neck supple. No JVD present.  Cardiovascular: Normal rate, regular rhythm, S1 normal, S2 normal, normal heart sounds and intact distal pulses.  Exam reveals no gallop and no friction rub.   No murmur heard. Pulmonary/Chest: Effort normal and breath sounds normal. No respiratory distress. He has no wheezes. He has no rales. He exhibits no tenderness.  Abdominal: Soft. Bowel sounds are normal. He exhibits no distension. There is no tenderness.  Musculoskeletal: Normal range of motion. He exhibits no edema and no tenderness.  Lymphadenopathy:    He has no cervical adenopathy.  Neurological: He is alert and oriented to person, place, and time. Coordination normal.  Skin: Skin is warm and dry. No rash noted. No erythema.       Ulcer that is shallow per the wife on the left lower extremity, wrap in place  Psychiatric: He has a normal mood and affect. His  behavior is normal. Judgment and thought content normal.           Assessment and Plan

## 2012-03-04 ENCOUNTER — Other Ambulatory Visit: Payer: Self-pay | Admitting: Family Medicine

## 2012-03-04 DIAGNOSIS — IMO0001 Reserved for inherently not codable concepts without codable children: Secondary | ICD-10-CM

## 2012-03-04 DIAGNOSIS — E785 Hyperlipidemia, unspecified: Secondary | ICD-10-CM

## 2012-03-04 DIAGNOSIS — Z125 Encounter for screening for malignant neoplasm of prostate: Secondary | ICD-10-CM

## 2012-03-04 DIAGNOSIS — I10 Essential (primary) hypertension: Secondary | ICD-10-CM

## 2012-03-05 ENCOUNTER — Other Ambulatory Visit: Payer: Medicare Other

## 2012-03-08 ENCOUNTER — Encounter: Payer: Medicare Other | Admitting: Family Medicine

## 2012-03-08 DIAGNOSIS — M48062 Spinal stenosis, lumbar region with neurogenic claudication: Secondary | ICD-10-CM | POA: Diagnosis not present

## 2012-03-08 DIAGNOSIS — M549 Dorsalgia, unspecified: Secondary | ICD-10-CM | POA: Diagnosis not present

## 2012-03-09 ENCOUNTER — Telehealth: Payer: Self-pay | Admitting: Family Medicine

## 2012-03-09 MED ORDER — SUCRALFATE 1 GM/10ML PO SUSP: 1.0000 g | Freq: Four times a day (QID) | ORAL | Status: DC

## 2012-03-09 NOTE — Telephone Encounter (Signed)
Seen with wife today. More stress recently - brother passed. Endorses chest burning that wakes him up. H/o severe GERD. Recommend trial of carafate qid - sent in script. Catheterization 2012 - mod CAD LAD. Taking nexium 12m bid as well as carafate which isn't helping.

## 2012-03-14 DIAGNOSIS — M5137 Other intervertebral disc degeneration, lumbosacral region: Secondary | ICD-10-CM | POA: Diagnosis not present

## 2012-03-14 DIAGNOSIS — M545 Low back pain, unspecified: Secondary | ICD-10-CM | POA: Diagnosis not present

## 2012-03-15 ENCOUNTER — Other Ambulatory Visit: Payer: Medicare Other

## 2012-03-19 ENCOUNTER — Other Ambulatory Visit (INDEPENDENT_AMBULATORY_CARE_PROVIDER_SITE_OTHER): Payer: Medicare Other

## 2012-03-19 DIAGNOSIS — Z125 Encounter for screening for malignant neoplasm of prostate: Secondary | ICD-10-CM

## 2012-03-19 DIAGNOSIS — IMO0001 Reserved for inherently not codable concepts without codable children: Secondary | ICD-10-CM

## 2012-03-19 DIAGNOSIS — I10 Essential (primary) hypertension: Secondary | ICD-10-CM | POA: Diagnosis not present

## 2012-03-19 DIAGNOSIS — E785 Hyperlipidemia, unspecified: Secondary | ICD-10-CM | POA: Diagnosis not present

## 2012-03-19 LAB — COMPREHENSIVE METABOLIC PANEL
ALT: 34 U/L (ref 0–53)
AST: 35 U/L (ref 0–37)
Alkaline Phosphatase: 91 U/L (ref 39–117)
Sodium: 139 mEq/L (ref 135–145)
Total Bilirubin: 0.9 mg/dL (ref 0.3–1.2)
Total Protein: 6.1 g/dL (ref 6.0–8.3)

## 2012-03-19 LAB — LIPID PANEL
Cholesterol: 161 mg/dL (ref 0–200)
HDL: 38.9 mg/dL — ABNORMAL LOW (ref >39.00)
LDL Cholesterol: 108 mg/dL — ABNORMAL HIGH (ref 0–99)
Total CHOL/HDL Ratio: 4
VLDL: 13.8 mg/dL (ref 0.0–40.0)

## 2012-03-19 LAB — MICROALBUMIN / CREATININE URINE RATIO: Microalb Creat Ratio: 0.6 mg/g (ref 0.0–30.0)

## 2012-03-19 LAB — PSA, MEDICARE: PSA: 0.82 ng/ml (ref 0.10–4.00)

## 2012-03-22 ENCOUNTER — Encounter: Payer: Self-pay | Admitting: Family Medicine

## 2012-03-22 ENCOUNTER — Ambulatory Visit (INDEPENDENT_AMBULATORY_CARE_PROVIDER_SITE_OTHER): Payer: Medicare Other | Admitting: Family Medicine

## 2012-03-22 VITALS — BP 122/56 | HR 68 | Temp 98.1°F | Ht 69.0 in | Wt 367.5 lb

## 2012-03-22 DIAGNOSIS — D126 Benign neoplasm of colon, unspecified: Secondary | ICD-10-CM | POA: Diagnosis not present

## 2012-03-22 DIAGNOSIS — Z1211 Encounter for screening for malignant neoplasm of colon: Secondary | ICD-10-CM | POA: Diagnosis not present

## 2012-03-22 DIAGNOSIS — I10 Essential (primary) hypertension: Secondary | ICD-10-CM | POA: Diagnosis not present

## 2012-03-22 DIAGNOSIS — H9209 Otalgia, unspecified ear: Secondary | ICD-10-CM

## 2012-03-22 DIAGNOSIS — Z0000 Encounter for general adult medical examination without abnormal findings: Secondary | ICD-10-CM | POA: Diagnosis not present

## 2012-03-22 DIAGNOSIS — IMO0001 Reserved for inherently not codable concepts without codable children: Secondary | ICD-10-CM | POA: Diagnosis not present

## 2012-03-22 DIAGNOSIS — E785 Hyperlipidemia, unspecified: Secondary | ICD-10-CM

## 2012-03-22 DIAGNOSIS — K635 Polyp of colon: Secondary | ICD-10-CM

## 2012-03-22 DIAGNOSIS — H9201 Otalgia, right ear: Secondary | ICD-10-CM | POA: Insufficient documentation

## 2012-03-22 DIAGNOSIS — E039 Hypothyroidism, unspecified: Secondary | ICD-10-CM

## 2012-03-22 MED ORDER — OFLOXACIN 0.3 % OT SOLN: 5.0000 [drp] | Freq: Every day | OTIC | Status: DC

## 2012-03-22 MED ORDER — HYDROCODONE-ACETAMINOPHEN 10-325 MG PO TABS: 1.0000 | ORAL_TABLET | Freq: Three times a day (TID) | ORAL | Status: DC | PRN

## 2012-03-22 MED ORDER — HYDROCODONE-ACETAMINOPHEN 5-500 MG PO TABS: 1.0000 | ORAL_TABLET | Freq: Two times a day (BID) | ORAL | Status: DC | PRN

## 2012-03-22 NOTE — Assessment & Plan Note (Signed)
I have requested records from [facility] today on latest colonoscopy.

## 2012-03-22 NOTE — Patient Instructions (Addendum)
Keep trying to back off reglan. Sign release form for records from [facility] on colonoscopy. Schedule eye doctor appointment. Look at advanced directives at home - fill out and bring me a copy. Return in 4 months for follow up or as needed. Take 2 red yeast rice capsules daily.

## 2012-03-22 NOTE — Assessment & Plan Note (Signed)
Chronic, stable. Continue lisinopril, hctz, metoprolol, and lasix.

## 2012-03-22 NOTE — Assessment & Plan Note (Signed)
Reviewed #s.   Tolerating red yeast rice - increase to 2 capsules daily.

## 2012-03-22 NOTE — Assessment & Plan Note (Signed)
In h/o otomycosis of R ear, however currently not consistent with fungal infection - treat external otitis of right ear with oflox.

## 2012-03-22 NOTE — Assessment & Plan Note (Signed)
Lab Results  Component Value Date   TSH 3.13 09/06/2011

## 2012-03-22 NOTE — Assessment & Plan Note (Signed)
Reviewed #s with patient.  Good control, continue glipizide, metformin and actos. rec schedule eye exam. Lab Results  Component Value Date   HGBA1C 6.9* 03/19/2012
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
## 2012-03-22 NOTE — Assessment & Plan Note (Addendum)
I have personally reviewed the Medicare Annual Wellness questionnaire and have noted 1. The patient's medical and social history 2. Their use of alcohol, tobacco or illicit drugs 3. Their current medications and supplements 4. The patient's functional ability including ADL's, fall risks, home safety risks and hearing or visual impairment. 5. Diet and physical activity 6. Evidence for depression or mood disorders The patients weight, height, BMI have been recorded in the chart.  Hearing and vision has been addressed. I have made referrals, counseling and provided education to the patient based review of the above and I have provided the pt with a written personalized care plan for preventive services. See scanned questionairre. Advanced directives discussed: pt has at home, has not filled out.  Asked him to fill out.  Reviewed preventative protocols and updated unless pt declined. UTD flu, tetanus, pneumovax. Consider Hep B as diabetic. PSA/DRE reassuring

## 2012-03-26 ENCOUNTER — Other Ambulatory Visit: Payer: Self-pay | Admitting: *Deleted

## 2012-03-26 MED ORDER — LISINOPRIL 40 MG PO TABS: 40.0000 mg | ORAL_TABLET | Freq: Every day | ORAL | Status: DC

## 2012-03-26 MED ORDER — PIOGLITAZONE HCL 30 MG PO TABS: 30.0000 mg | ORAL_TABLET | Freq: Every day | ORAL | Status: DC

## 2012-03-29 ENCOUNTER — Other Ambulatory Visit: Payer: Self-pay | Admitting: *Deleted

## 2012-03-29 DIAGNOSIS — M25559 Pain in unspecified hip: Secondary | ICD-10-CM | POA: Diagnosis not present

## 2012-03-29 DIAGNOSIS — M76899 Other specified enthesopathies of unspecified lower limb, excluding foot: Secondary | ICD-10-CM | POA: Diagnosis not present

## 2012-03-29 MED ORDER — METOCLOPRAMIDE HCL 10 MG PO TABS: 10.0000 mg | ORAL_TABLET | Freq: Two times a day (BID) | ORAL | Status: DC

## 2012-03-29 NOTE — Telephone Encounter (Signed)
Ok to fill at current sig and quantity? Per 03/22/12 ov pt is supposed to decrease usage.

## 2012-04-02 ENCOUNTER — Ambulatory Visit (INDEPENDENT_AMBULATORY_CARE_PROVIDER_SITE_OTHER): Payer: Medicare Other | Admitting: Cardiovascular Disease

## 2012-04-02 ENCOUNTER — Encounter: Payer: Self-pay | Admitting: Cardiovascular Disease

## 2012-04-02 VITALS — BP 142/68 | HR 89 | Ht 69.0 in | Wt 369.8 lb

## 2012-04-02 DIAGNOSIS — I2510 Atherosclerotic heart disease of native coronary artery without angina pectoris: Secondary | ICD-10-CM | POA: Diagnosis not present

## 2012-04-02 DIAGNOSIS — E785 Hyperlipidemia, unspecified: Secondary | ICD-10-CM | POA: Diagnosis not present

## 2012-04-02 DIAGNOSIS — R609 Edema, unspecified: Secondary | ICD-10-CM

## 2012-04-02 DIAGNOSIS — I10 Essential (primary) hypertension: Secondary | ICD-10-CM | POA: Diagnosis not present

## 2012-04-02 DIAGNOSIS — I5032 Chronic diastolic (congestive) heart failure: Secondary | ICD-10-CM

## 2012-04-02 DIAGNOSIS — I509 Heart failure, unspecified: Secondary | ICD-10-CM

## 2012-04-02 HISTORY — DX: Chronic diastolic (congestive) heart failure: I50.32

## 2012-04-02 NOTE — Assessment & Plan Note (Addendum)
Currently with no symptoms of angina. No further workup at this time. Continue current medication regimen.  Currently not on a statin. He is taking red yeast rice. Ideally goal cholesterol should be less than 150.we have recommended weight loss. Could potentially increase red yeast rice to four- a day if unable to achieve his goal

## 2012-04-02 NOTE — Assessment & Plan Note (Signed)
Edema has essentially resolved. We have encouraged him to take Lasix several times per week, at least 2 or 3. Ulcer on his leg has resolved.

## 2012-04-02 NOTE — Assessment & Plan Note (Signed)
We have recommended that he stay on Lasix at least 2 or 3 times per week. He did have improvement of symptoms on Lasix twice a day but he has weaned himself to no Lasix. We have asked them to closely watch his weight and take Lasix for any edema, or any worsening shortness of breath.

## 2012-04-02 NOTE — Assessment & Plan Note (Signed)
On red yeast rice.

## 2012-04-02 NOTE — Patient Instructions (Addendum)
You are doing well. Continue to take lasix three times a week, hold for dehydration  Take extra lasix as needed for ankle edema   Please call us if you have new issues that need to be addressed before your next appt.  Your physician wants you to follow-up in: 6 months.  You will receive a reminder letter in the mail two months in advance. If you don't receive a letter, please call our office to schedule the follow-up appointment.

## 2012-04-02 NOTE — Assessment & Plan Note (Signed)
Blood pressure is well controlled on today's visit. No changes made to the medications. 

## 2012-04-02 NOTE — Progress Notes (Signed)
Patient ID: JI FELDNER, male    DOB: 04/26/1955, 57 y.o.   MRN: [id number]  HPI Comments: Mr. Lavell is a very pleasant 57 year old gentleman with a history of morbid obesity, history of gastric bypass, obstructive sleep apnea who does not wear CPAP, history of throat surgery for sleep apnea though the details are unavailable, history of GERD who sleeps on a wedge who initially presented for abnormal EKG. He had a cardiac catheterization in March of 2012 showing mild disease, moderate calcifications with no intervention needed.  On his last clinic visit, he had classic signs of diastolic CHF, acute on chronic.  He was started on Lasix twice a day with significant improvement in his weight, edema. Ulcer on his leg has essentially healed. Shortness of breath has significantly improved. He is sleeping better. He is currently not taking Lasix for the past week or so as he was feeling better  In the past, he was not able to tolerate CPAP and returned the machine many years ago. He is not interested in having any repeat testing.  He has tried cholesterol medications in the past including Lipitor and he reports it has caused profound cramping in his legs to the point where he has gone to the hospital 3 times.  EKG shows normal sinus rhythm with rate 89 beats per minute with frequent PVCs and trigeminal pattern, right bundle branch block  Outpatient Encounter Prescriptions as of 04/02/2012  Medication Sig Dispense Refill  . aspirin EC 81 MG tablet Take 162 mg by mouth daily.        Marland Kitchen esomeprazole (NEXIUM) 40 MG capsule Take 1 capsule (40 mg total) by mouth 2 (two) times daily.  60 capsule  6  . fish oil-omega-3 fatty acids 1000 MG capsule Take 1 g by mouth daily.      . Flaxseed, Linseed, (FLAX SEED OIL) 1000 MG CAPS Take 1 capsule by mouth 2 (two) times daily.        . furosemide (LASIX) 20 MG tablet Take 1 tablet (20 mg total) by mouth 2 (two) times daily as needed.  180 tablet  3  . glipiZIDE  (GLUCOTROL XL) 10 MG 24 hr tablet Take 1 tablet (10 mg total) by mouth 2 (two) times daily.  60 tablet  11  . hydrochlorothiazide (HYDRODIURIL) 25 MG tablet Take 1 tablet (25 mg total) by mouth daily.  90 tablet  3  . HYDROcodone-acetaminophen (NORCO) 10-325 MG per tablet Take 1 tablet by mouth every 8 (eight) hours as needed for pain.  60 tablet  0  . ibuprofen (ADVIL,MOTRIN) 600 MG tablet Take 1 tablet (600 mg total) by mouth every 6 (six) hours as needed for pain. For pain  30 tablet  0  . levothyroxine (SYNTHROID, LEVOTHROID) 100 MCG tablet Take 1 tablet (100 mcg total) by mouth daily.  30 tablet  6  . lisinopril (PRINIVIL,ZESTRIL) 40 MG tablet Take 1 tablet (40 mg total) by mouth daily.  30 tablet  6  . metFORMIN (GLUCOPHAGE) 1000 MG tablet Take 1-1.5 tablets (1,000-1,500 mg total) by mouth 2 (two) times daily with a meal. Take 1 tablet in the AM and 1.5 tablet in the PM  75 tablet  6  . metoCLOPramide (REGLAN) 10 MG tablet Take 1 tablet (10 mg total) by mouth 2 (two) times daily.  60 tablet  1  . metoprolol tartrate (LOPRESSOR) 25 MG tablet Take 1 tablet (25 mg total) by mouth 2 (two) times daily.  60 tablet  6  . Multiple Vitamin (MULITIVITAMIN WITH MINERALS) TABS Take 1 tablet by mouth daily.      Marland Kitchen ofloxacin (FLOXIN OTIC) 0.3 % otic solution Place 5 drops into the right ear daily. For 7 days  5 mL  0  . omega-3 acid ethyl esters (LOVAZA) 1 G capsule Take 2 capsules (2 g total) by mouth 2 (two) times daily.  120 capsule  11  . pioglitazone (ACTOS) 30 MG tablet Take 1 tablet (30 mg total) by mouth daily.  30 tablet  6  . potassium chloride (K-DUR) 10 MEQ tablet Take 1 tablet (10 mEq total) by mouth 2 (two) times daily as needed.  180 tablet  3  . Red Yeast Rice 600 MG CAPS Take 2 capsules by mouth daily.      . sucralfate (CARAFATE) 1 GM/10ML suspension Take 10 mLs (1 g total) by mouth 4 (four) times daily.  420 mL  1    Review of Systems  Constitutional: Negative.   HENT: Negative.    Eyes: Negative.   Respiratory:       Much improved shortness of breath  Gastrointestinal: Negative.   Musculoskeletal: Positive for arthralgias.  Skin: Negative.   Neurological: Negative.   Psychiatric/Behavioral: Negative.   All other systems reviewed and are negative.    BP 142/68  Pulse 89  Ht 5' 9"  (1.753 m)  Wt 369 lb 12 oz (167.717 kg)  BMI 54.58 kg/m2  Physical Exam  Nursing note and vitals reviewed. Constitutional: He is oriented to person, place, and time. He appears well-developed and well-nourished.  obese  HENT:  Head: Normocephalic.  Nose: Nose normal.  Mouth/Throat: Oropharynx is clear and moist.  Eyes: Conjunctivae are normal. Pupils are equal, round, and reactive to light.  Neck: Normal range of motion. Neck supple. No JVD present.  Cardiovascular: Normal rate, regular rhythm, S1 normal, S2 normal, normal heart sounds and intact distal pulses.  Exam reveals no gallop and no friction rub.   No murmur heard. Pulmonary/Chest: Effort normal and breath sounds normal. No respiratory distress. He has no wheezes. He has no rales. He exhibits no tenderness.  Abdominal: Soft. Bowel sounds are normal. He exhibits no distension. There is no tenderness.  Musculoskeletal: Normal range of motion. He exhibits no edema and no tenderness.  Lymphadenopathy:    He has no cervical adenopathy.  Neurological: He is alert and oriented to person, place, and time. Coordination normal.  Skin: Skin is warm and dry. No rash noted. No erythema.  Psychiatric: He has a normal mood and affect. His behavior is normal. Judgment and thought content normal.      Assessment and Plan

## 2012-04-09 ENCOUNTER — Encounter: Payer: Self-pay | Admitting: Family Medicine

## 2012-04-24 ENCOUNTER — Telehealth: Payer: Self-pay | Admitting: *Deleted

## 2012-04-24 NOTE — Telephone Encounter (Signed)
Form for diabetic testing supplies in your IN box for completion. 

## 2012-04-25 NOTE — Telephone Encounter (Signed)
Filled and placed in my out box. Only 1x/day 2/2 controlled diabetes.

## 2012-05-03 DIAGNOSIS — M25559 Pain in unspecified hip: Secondary | ICD-10-CM | POA: Diagnosis not present

## 2012-05-03 DIAGNOSIS — M549 Dorsalgia, unspecified: Secondary | ICD-10-CM | POA: Diagnosis not present

## 2012-05-03 DIAGNOSIS — M76899 Other specified enthesopathies of unspecified lower limb, excluding foot: Secondary | ICD-10-CM | POA: Diagnosis not present

## 2012-05-09 ENCOUNTER — Telehealth: Payer: Self-pay | Admitting: Family Medicine

## 2012-05-09 ENCOUNTER — Encounter: Payer: Self-pay | Admitting: Gastroenterology

## 2012-05-09 DIAGNOSIS — K635 Polyp of colon: Secondary | ICD-10-CM

## 2012-05-09 NOTE — Telephone Encounter (Signed)
H/o polyps Seen at wife's visit. No records of colonoscopy done at [facility] pt thinks either 2008 or 2010. Requests referral for rpt colonsocopy with [facility].  Have placed referral.

## 2012-05-17 DIAGNOSIS — H60509 Unspecified acute noninfective otitis externa, unspecified ear: Secondary | ICD-10-CM | POA: Diagnosis not present

## 2012-05-17 DIAGNOSIS — H9209 Otalgia, unspecified ear: Secondary | ICD-10-CM | POA: Diagnosis not present

## 2012-05-21 ENCOUNTER — Ambulatory Visit: Payer: Medicare Other | Admitting: Family Medicine

## 2012-05-23 ENCOUNTER — Ambulatory Visit (INDEPENDENT_AMBULATORY_CARE_PROVIDER_SITE_OTHER): Payer: Medicare Other | Admitting: Family Medicine

## 2012-05-23 ENCOUNTER — Encounter: Payer: Self-pay | Admitting: Family Medicine

## 2012-05-23 VITALS — BP 124/66 | HR 88 | Temp 98.1°F | Wt 376.2 lb

## 2012-05-23 DIAGNOSIS — H9209 Otalgia, unspecified ear: Secondary | ICD-10-CM | POA: Diagnosis not present

## 2012-05-23 DIAGNOSIS — E119 Type 2 diabetes mellitus without complications: Secondary | ICD-10-CM | POA: Diagnosis not present

## 2012-05-23 DIAGNOSIS — E785 Hyperlipidemia, unspecified: Secondary | ICD-10-CM

## 2012-05-23 DIAGNOSIS — H9201 Otalgia, right ear: Secondary | ICD-10-CM

## 2012-05-23 MED ORDER — MECLIZINE HCL 25 MG PO TABS: 25.0000 mg | ORAL_TABLET | Freq: Two times a day (BID) | ORAL | Status: DC | PRN

## 2012-05-23 NOTE — Assessment & Plan Note (Signed)
Chronic, anticipate continued good control. Blood work next visit. Continue meds as up to now. Encouraged scheduling eye exam.

## 2012-05-23 NOTE — Patient Instructions (Addendum)
No blood work today - next time. For ear and dizziness - try meclizine as needed.  I will touch base with Dr. Pryor Ochoa and will contact you if any change in plan. Return in 4 months for follow up. Good to see you today! Call us with questions.

## 2012-05-23 NOTE — Assessment & Plan Note (Addendum)
Persistent pain externally but ear exam overall stable today. S/p oflox treatment 02/2012, latest antbiotic steroid drop combination last week by ENT. I touched base with Dr. Pryor Ochoa today with concern for mastoid tenderness.   Will obtain imaging of R skull and f/u with ENT.  Appreciate Dr. Darien Ramus care of pt.

## 2012-05-23 NOTE — Assessment & Plan Note (Signed)
Tolerating red yeast rice 1274m daily well.
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
## 2012-05-25 ENCOUNTER — Other Ambulatory Visit: Payer: Self-pay | Admitting: Family Medicine

## 2012-05-25 MED ORDER — METOCLOPRAMIDE HCL 10 MG PO TABS: 10.0000 mg | ORAL_TABLET | Freq: Two times a day (BID) | ORAL | Status: DC | PRN

## 2012-05-25 MED ORDER — HYDROCODONE-ACETAMINOPHEN 10-325 MG PO TABS: 1.0000 | ORAL_TABLET | Freq: Three times a day (TID) | ORAL | Status: DC | PRN

## 2012-05-25 NOTE — Telephone Encounter (Signed)
Refill request for Hydrocodone.  Last filled 02/27/12.

## 2012-05-25 NOTE — Telephone Encounter (Signed)
plz phone in. 

## 2012-05-28 NOTE — Telephone Encounter (Signed)
Rx called in as directed.   

## 2012-05-29 ENCOUNTER — Ambulatory Visit: Payer: Self-pay | Admitting: Otolaryngology

## 2012-05-29 DIAGNOSIS — R42 Dizziness and giddiness: Secondary | ICD-10-CM | POA: Diagnosis not present

## 2012-05-29 DIAGNOSIS — H9209 Otalgia, unspecified ear: Secondary | ICD-10-CM | POA: Diagnosis not present

## 2012-06-07 ENCOUNTER — Encounter: Payer: Self-pay | Admitting: *Deleted

## 2012-06-07 NOTE — Progress Notes (Signed)
Patient ID: Chad [name], male   DOB: 1955-12-22, 57 y.o.   MRN: [id number] Pt here for PV today for colonoscopy scheduled for 5/1 with Dr. Hilarie Fredrickson.  Pt weighs 371.2lb. BMI 55.  Pt has complicated medical hx inculding diabetes, hx tracheostomy, sleep apnea, CHF.  Pt had colonoscopy 2008- hyperplastic polyps- report and pathology  in [facility]. Colonoscopy scheduled for 5/1 cancelled and new pt appt scheduled with Dr. Hilarie Fredrickson for 5/13 at 9:45.

## 2012-06-18 ENCOUNTER — Other Ambulatory Visit: Payer: Self-pay | Admitting: *Deleted

## 2012-06-18 MED ORDER — HYDROCODONE-ACETAMINOPHEN 10-325 MG PO TABS: 1.0000 | ORAL_TABLET | Freq: Three times a day (TID) | ORAL | Status: DC | PRN

## 2012-06-18 NOTE — Telephone Encounter (Signed)
Please call in

## 2012-06-18 NOTE — Telephone Encounter (Signed)
Last filled 05/28/12

## 2012-06-19 NOTE — Telephone Encounter (Signed)
Rx called in as directed.   

## 2012-06-20 DIAGNOSIS — E119 Type 2 diabetes mellitus without complications: Secondary | ICD-10-CM | POA: Diagnosis not present

## 2012-06-20 LAB — HM DIABETES EYE EXAM

## 2012-06-21 ENCOUNTER — Encounter: Payer: Medicare Other | Admitting: Internal Medicine

## 2012-06-21 ENCOUNTER — Encounter: Payer: Medicare Other | Admitting: Gastroenterology

## 2012-06-25 ENCOUNTER — Other Ambulatory Visit: Payer: Self-pay | Admitting: *Deleted

## 2012-06-25 MED ORDER — METOCLOPRAMIDE HCL 10 MG PO TABS: 10.0000 mg | ORAL_TABLET | Freq: Two times a day (BID) | ORAL | Status: DC | PRN

## 2012-06-27 ENCOUNTER — Encounter: Payer: Self-pay | Admitting: Internal Medicine

## 2012-06-29 DIAGNOSIS — H2510 Age-related nuclear cataract, unspecified eye: Secondary | ICD-10-CM | POA: Diagnosis not present

## 2012-06-29 DIAGNOSIS — H02409 Unspecified ptosis of unspecified eyelid: Secondary | ICD-10-CM | POA: Diagnosis not present

## 2012-07-02 DIAGNOSIS — H2510 Age-related nuclear cataract, unspecified eye: Secondary | ICD-10-CM | POA: Diagnosis not present

## 2012-07-02 DIAGNOSIS — H269 Unspecified cataract: Secondary | ICD-10-CM | POA: Diagnosis not present

## 2012-07-03 ENCOUNTER — Ambulatory Visit (INDEPENDENT_AMBULATORY_CARE_PROVIDER_SITE_OTHER): Payer: Medicare Other | Admitting: Internal Medicine

## 2012-07-03 ENCOUNTER — Encounter: Payer: Self-pay | Admitting: Internal Medicine

## 2012-07-03 VITALS — BP 140/88 | HR 87 | Ht 69.0 in | Wt 375.8 lb

## 2012-07-03 DIAGNOSIS — K5900 Constipation, unspecified: Secondary | ICD-10-CM

## 2012-07-03 DIAGNOSIS — D126 Benign neoplasm of colon, unspecified: Secondary | ICD-10-CM | POA: Diagnosis not present

## 2012-07-03 DIAGNOSIS — K635 Polyp of colon: Secondary | ICD-10-CM

## 2012-07-03 MED ORDER — POLYETHYLENE GLYCOL 3350 17 GM/SCOOP PO POWD: 17.0000 g | Freq: Every day | ORAL | Status: DC
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
## 2012-07-03 NOTE — Patient Instructions (Addendum)
Start taking Miralax 17g daily.   You are due for a repoeat colonoscopy in September  2018, you will receive a letter from our office when the date approaches as a reminder.                                               We are excited to introduce MyChart, a new best-in-class service that provides you online access to important information in your electronic medical record. We want to make it easier for you to view your health information - all in one secure location - when and where you need it. We expect MyChart will enhance the quality of care and service we provide.  When you register for MyChart, you can:    View your test results.    Request appointments and receive appointment reminders via email.    Request medication renewals.    View your medical history, allergies, medications and immunizations.    Communicate with your physician's office through a password-protected site.    Conveniently print information such as your medication lists.  To find out if MyChart is right for you, please talk to a member of our clinical staff today. We will gladly answer your questions about this free health and wellness tool.  If you are age 57 or older and want a member of your family to have access to your record, you must provide written consent by completing a proxy form available at our office. Please speak to our clinical staff about guidelines regarding accounts for patients younger than age 57.  As you activate your MyChart account and need any technical assistance, please call the MyChart technical support line at [phone]) or email your question to [email or web address]. If you email your question(s), please include your name, a return phone number and the best time to reach you.  If you have non-urgent health-related questions, you can send a message to our office through [place] at [email or web address]. If you have a medical emergency, call 911.  Thank you  for using MyChart as your new health and wellness resource!   MyChart licensed from [employer],  1999-2010. Patents Pending.

## 2012-07-10 DIAGNOSIS — R6884 Jaw pain: Secondary | ICD-10-CM | POA: Diagnosis not present

## 2012-07-10 DIAGNOSIS — H9209 Otalgia, unspecified ear: Secondary | ICD-10-CM | POA: Diagnosis not present

## 2012-07-11 DIAGNOSIS — M76899 Other specified enthesopathies of unspecified lower limb, excluding foot: Secondary | ICD-10-CM | POA: Diagnosis not present

## 2012-07-13 ENCOUNTER — Other Ambulatory Visit: Payer: Self-pay | Admitting: *Deleted

## 2012-07-13 MED ORDER — HYDROCODONE-ACETAMINOPHEN 10-325 MG PO TABS: 1.0000 | ORAL_TABLET | Freq: Three times a day (TID) | ORAL | Status: DC | PRN

## 2012-07-13 NOTE — Telephone Encounter (Signed)
Phoned in.

## 2012-07-13 NOTE — Telephone Encounter (Signed)
Last filled 06/19/12

## 2012-07-16 ENCOUNTER — Encounter: Payer: Self-pay | Admitting: Family Medicine

## 2012-07-18 ENCOUNTER — Telehealth: Payer: Self-pay | Admitting: *Deleted

## 2012-07-18 NOTE — Telephone Encounter (Signed)
Form for diabetic shoes in your IN box

## 2012-07-19 NOTE — Telephone Encounter (Signed)
Filled and placed in my out box

## 2012-07-20 ENCOUNTER — Encounter: Payer: Self-pay | Admitting: Family Medicine

## 2012-07-23 ENCOUNTER — Other Ambulatory Visit: Payer: Self-pay | Admitting: *Deleted

## 2012-07-23 MED ORDER — LEVOTHYROXINE SODIUM 100 MCG PO TABS: 100.0000 ug | ORAL_TABLET | Freq: Every day | ORAL | Status: DC

## 2012-07-25 DIAGNOSIS — M76899 Other specified enthesopathies of unspecified lower limb, excluding foot: Secondary | ICD-10-CM | POA: Diagnosis not present

## 2012-08-03 ENCOUNTER — Other Ambulatory Visit: Payer: Self-pay | Admitting: *Deleted

## 2012-08-03 MED ORDER — HYDROCODONE-ACETAMINOPHEN 10-325 MG PO TABS: 1.0000 | ORAL_TABLET | Freq: Three times a day (TID) | ORAL | Status: DC | PRN

## 2012-08-03 NOTE — Telephone Encounter (Signed)
Faxed refill request for norco from [facility], last filled 60 on 07/14/12.

## 2012-08-03 NOTE — Telephone Encounter (Signed)
Medication phoned to pharmacy.  

## 2012-08-03 NOTE — Telephone Encounter (Signed)
This request is 10 days early - can we check with pt to see why needing early?

## 2012-08-03 NOTE — Telephone Encounter (Signed)
plz phone in. 

## 2012-08-03 NOTE — Telephone Encounter (Signed)
Spoke with patient and he said he was having to take them more than usual due to hip and knee pain flaring up. He said he got an injection in his hip which he is hoping will kick in soon, but it hasn't helped yet.

## 2012-08-14 ENCOUNTER — Telehealth: Payer: Self-pay | Admitting: Family Medicine

## 2012-08-14 NOTE — Telephone Encounter (Addendum)
At wife's visit, brings log of sugars showing fasing 60-80s, PM sugars 88-101.  Some hypoglycemic sxs. I suggested he decrease glipizide XL to 14m once daily. Pt checking sugars BID. Lab Results  Component Value Date   HGBA1C 6.9* 03/19/2012

## 2012-08-23 ENCOUNTER — Other Ambulatory Visit: Payer: Self-pay | Admitting: Family Medicine

## 2012-08-27 ENCOUNTER — Ambulatory Visit (INDEPENDENT_AMBULATORY_CARE_PROVIDER_SITE_OTHER): Payer: Medicare Other | Admitting: Family Medicine

## 2012-08-27 ENCOUNTER — Encounter: Payer: Self-pay | Admitting: Family Medicine

## 2012-08-27 VITALS — BP 122/70 | HR 80 | Temp 98.3°F | Wt 380.0 lb

## 2012-08-27 DIAGNOSIS — R079 Chest pain, unspecified: Secondary | ICD-10-CM

## 2012-08-27 DIAGNOSIS — I440 Atrioventricular block, first degree: Secondary | ICD-10-CM

## 2012-08-27 DIAGNOSIS — I10 Essential (primary) hypertension: Secondary | ICD-10-CM | POA: Diagnosis not present

## 2012-08-27 DIAGNOSIS — J3489 Other specified disorders of nose and nasal sinuses: Secondary | ICD-10-CM | POA: Insufficient documentation

## 2012-08-27 DIAGNOSIS — I509 Heart failure, unspecified: Secondary | ICD-10-CM

## 2012-08-27 DIAGNOSIS — E119 Type 2 diabetes mellitus without complications: Secondary | ICD-10-CM | POA: Diagnosis not present

## 2012-08-27 DIAGNOSIS — I5032 Chronic diastolic (congestive) heart failure: Secondary | ICD-10-CM

## 2012-08-27 LAB — BASIC METABOLIC PANEL
GFR: 75.58 mL/min (ref >60.00)
Glucose, Bld: 145 mg/dL — ABNORMAL HIGH (ref 70–99)
Potassium: 4 mEq/L (ref 3.5–5.1)
Sodium: 137 mEq/L (ref 135–145)

## 2012-08-27 MED ORDER — MUPIROCIN 2 % EX OINT: TOPICAL_OINTMENT | Freq: Three times a day (TID) | CUTANEOUS | Status: DC

## 2012-08-27 NOTE — Assessment & Plan Note (Signed)
Significant tenderness today associated with large crusted lesion, but no obvious abscess or hematoma today. Will treat as possible MRSA nasal sore with topical mupirocin - and low threshold given comorbidities if not improving to refer to ENT for further evaluation. Pt agrees with plan.

## 2012-08-27 NOTE — Assessment & Plan Note (Signed)
Atypical chest pain, concerning mainly because of comorbidities present. I anticipate some of pain related to current concern for mild acute diastolic CHF exacerbation - increase lasix to daily for 3 days then start three times weekly. I do think it's prudent in setting of worsening chest pain associated with dyspnea for patient to return to Dr. [name] for recheck.   I also discussed red flags to seek urgent or ER care. Pt agrees with plan and will call to schedule appointment.  EKG - NSR rate rate 60s, 1st deg AVB, known RBBB, RAD, no acute ST/T changes, seems unchanged from prior EKG.

## 2012-08-27 NOTE — Assessment & Plan Note (Signed)
I will get Dr. Donivan Scull opinion of metoprolol in setting of 1oAVB.

## 2012-08-27 NOTE — Assessment & Plan Note (Addendum)
Concern for acute exacerbation today given marked pedal edema and chest discomfort associated with dyspnea - I recommended daily lasix use for next 3 days then use regularly MWF (was not using). See below. Consider slow titration off actos.

## 2012-08-27 NOTE — Patient Instructions (Addendum)
Use nasal saline over the counter. Take lasix three days straight. EKG looking stable today. Use mupirocin for nose for next 3-5 days - if not improving, call me for referral to ENT.

## 2012-08-27 NOTE — Progress Notes (Signed)
Subjective:    Patient ID: Chad [name], male    DOB: [birth year], 57 y.o.   MRN: [id number]  HPI CC: sore in nose  For last 3 weeks has sore in left nare.  Has tried triple abx ointment.  Overall doesn't feel well.  Using OTC nasal decongestant spray.  Has used 4 times.  Doesn't really help him breathe better.  Nose issue wakes him up several times at night.  Some trouble breathing 2/2 this.  Some bleeding with blowing nose.   H/o OSA, does not use CPAP - has been unable to tolerate in the past. No known h/o MRSA. + h/o aspergillus causing otitis externa on right.  Oh by the way - progressively worsening left sided chest pain that intermittently comes on during the day - not exertional or relieved by rest.  Described as sharp pain, grabbing pain.  Occasionally travels down left arm.  Pain takes breath away, usually resolves after a few minutes. Known h/o chronic diastolic dysfunction.  Weight gain noted.  Has not been taking fluid pill.  Catheterization 04/2010 with mild CAD. Known comorbidities including T2DM (controlled), HTN (controlled), HLD and morbid obesity. Wt Readings from Last 3 Encounters:  08/27/12 380 lb (172.367 kg)  07/03/12 375 lb 12.8 oz (170.462 kg)  05/23/12 376 lb 4 oz (170.666 kg)   Lab Results  Component Value Date   CREATININE 0.9 03/19/2012    Past Medical History  Diagnosis Date  . Arthritis     mainly in R shoulder, s/p shots  . T2DM (type 2 diabetes mellitus) 1990s  . HTN (hypertension)   . HLD (hyperlipidemia)     statin caused leg cramps  . GERD (gastroesophageal reflux disease)     severe, h/o gastritis and GI bleed, per pt normal EGD at [facility] 2008  . Seasonal allergies   . Colon polyp 2008  . Hypothyroid   . Hearing impairment     hearing aides  . Gastric bypass status for obesity 1985  . OSA (obstructive sleep apnea)     unable to use CPAP as of last try 2/2 h/o tracheostomy?  . Morbid obesity   . RBBB   . Bulging lumbar disc   . Bone  spur     L4 L5  . Narrowing of lumbar spine   . Colon polyps     2009  . Right ear pain     s/p eval by ENT - thought TMJ referred pain and sent to oral surg for dental splint    Review of Systems Per HPI    Objective:   Physical Exam  Nursing note and vitals reviewed. Constitutional: He appears well-developed and well-nourished. No distress.  Morbidly obese  HENT:  Head: Normocephalic and atraumatic.  Right Ear: Hearing, tympanic membrane, external ear and ear canal normal.  Left Ear: Hearing, tympanic membrane, external ear and ear canal normal.  Nose: Mucosal edema and sinus tenderness present. No rhinorrhea. Right sinus exhibits no maxillary sinus tenderness and no frontal sinus tenderness. Left sinus exhibits no maxillary sinus tenderness and no frontal sinus tenderness.  Mouth/Throat: Oropharynx is clear and moist. No oropharyngeal exudate.  Tender swelling of left nasal septum.  Mild nasal swelling present but no erythema.  + crusted bloody discharge at nasal septum.  No hematoma.  Eyes: Conjunctivae and EOM are normal. Pupils are equal, round, and reactive to light. No scleral icterus.  Neck: Normal range of motion. Neck supple.  Cardiovascular: Normal rate, regular rhythm, normal heart  sounds and intact distal pulses.   No murmur heard. Pulmonary/Chest: Effort normal and breath sounds normal. No respiratory distress. He has no wheezes. He has no rales. He exhibits tenderness (some reproducible chest tenderness to palpation, but feels different from endorsed sharp chest pains).  Musculoskeletal: He exhibits edema (2+ pitting edema bilaterally).  LLE with venous ulcer present anterior lower leg  Lymphadenopathy:    He has no cervical adenopathy.  Skin: Skin is warm and dry. No rash noted.       Assessment & Plan:

## 2012-08-29 ENCOUNTER — Encounter: Payer: Self-pay | Admitting: *Deleted

## 2012-09-03 ENCOUNTER — Other Ambulatory Visit: Payer: Self-pay | Admitting: Family Medicine

## 2012-09-03 NOTE — Telephone Encounter (Signed)
plz phone in. 

## 2012-09-03 NOTE — Telephone Encounter (Signed)
OK to refill

## 2012-09-03 NOTE — Telephone Encounter (Signed)
Pt's wife request refill hydrocodone apap to [facility].Please advise.

## 2012-09-04 ENCOUNTER — Other Ambulatory Visit: Payer: Self-pay | Admitting: Family Medicine

## 2012-09-04 NOTE — Telephone Encounter (Signed)
[name] called in for status of refill; advised I would call in now. Medication phoned to [place] as instructed. -

## 2012-09-04 NOTE — Telephone Encounter (Signed)
I think this was sent in already.  plz send in if not done.

## 2012-09-04 NOTE — Telephone Encounter (Signed)
Chad [name] called for status of refill. Advised would call in to [place] now. See previous phone note 09/03/12.

## 2012-09-07 ENCOUNTER — Ambulatory Visit (INDEPENDENT_AMBULATORY_CARE_PROVIDER_SITE_OTHER): Payer: Medicare Other | Admitting: Cardiovascular Disease

## 2012-09-07 ENCOUNTER — Encounter: Payer: Self-pay | Admitting: Cardiovascular Disease

## 2012-09-07 VITALS — BP 134/82 | HR 69 | Ht 69.0 in | Wt 378.0 lb

## 2012-09-07 DIAGNOSIS — R0609 Other forms of dyspnea: Secondary | ICD-10-CM

## 2012-09-07 DIAGNOSIS — R609 Edema, unspecified: Secondary | ICD-10-CM

## 2012-09-07 DIAGNOSIS — R0600 Dyspnea, unspecified: Secondary | ICD-10-CM

## 2012-09-07 DIAGNOSIS — R0989 Other specified symptoms and signs involving the circulatory and respiratory systems: Secondary | ICD-10-CM

## 2012-09-07 DIAGNOSIS — I2510 Atherosclerotic heart disease of native coronary artery without angina pectoris: Secondary | ICD-10-CM

## 2012-09-07 DIAGNOSIS — R079 Chest pain, unspecified: Secondary | ICD-10-CM | POA: Diagnosis not present

## 2012-09-07 DIAGNOSIS — R0602 Shortness of breath: Secondary | ICD-10-CM | POA: Diagnosis not present

## 2012-09-07 DIAGNOSIS — I10 Essential (primary) hypertension: Secondary | ICD-10-CM

## 2012-09-07 DIAGNOSIS — I509 Heart failure, unspecified: Secondary | ICD-10-CM

## 2012-09-07 DIAGNOSIS — E669 Obesity, unspecified: Secondary | ICD-10-CM

## 2012-09-07 DIAGNOSIS — I5032 Chronic diastolic (congestive) heart failure: Secondary | ICD-10-CM

## 2012-09-07 NOTE — Assessment & Plan Note (Signed)
Trace edema, not particularly impressive on today's visit. May not be a accurate indicator of his diastolic CHF

## 2012-09-07 NOTE — Progress Notes (Signed)
Patient ID: Chad [name], male    DOB: 20-Nov-1955, 57 y.o.   MRN: [id number]  HPI Comments: Chad [name] is a very pleasant 57 year old gentleman with a history of morbid obesity, possible  obstructive sleep apnea , history of gastric bypass, obstructive sleep apnea who does not wear CPAP, history of throat surgery for sleep apnea though the details are unavailable, history of GERD who sleeps on a wedge who initially presented for abnormal EKG. He had a cardiac catheterization in March of 2012 showing mild disease, moderate calcifications with no intervention needed. Last hemoglobin A1c 6.4  History of diastolic CHF, acute on chronic.  In the past, he was given Lasix twice a day with significant improvement in his weight, edema.  He continues to have a persistent Ulcer on his leg.  Shortness of breath on today's visit is worse, worsening edema, weight gain of 9 pounds from his prior clinic visit . Is not taking Lasix for a while now is taking it every day for the past week with mild improvement of his edema . Still with symptoms . He has had a cough for the past several weeks which is dry, nonproductive  In the past, he was not able to tolerate CPAP and returned the machine many years ago. He is not interested in having any repeat testing.  He has tried cholesterol medications in the past including Lipitor and he reports it has caused profound cramping in his legs to the point where he has gone to the hospital 3 times.  EKG shows normal sinus rhythm with rate 69 beats per minute with  right bundle branch block  Outpatient Encounter Prescriptions as of 09/07/2012  Medication Sig Dispense Refill  . aspirin EC 81 MG tablet Take 162 mg by mouth daily.        Marland Kitchen esomeprazole (NEXIUM) 40 MG capsule Take 1 capsule (40 mg total) by mouth 2 (two) times daily.  60 capsule  6  . Flaxseed, Linseed, (FLAX SEED OIL) 1000 MG CAPS Take 1 capsule by mouth 2 (two) times daily.        . furosemide (LASIX) 20 MG  tablet Take 20 mg by mouth daily as needed.      Marland Kitchen glipiZIDE (GLUCOTROL XL) 10 MG 24 hr tablet Take 10 mg by mouth daily.      . hydrochlorothiazide (HYDRODIURIL) 25 MG tablet Take 1 tablet (25 mg total) by mouth daily.  90 tablet  3  . HYDROcodone-acetaminophen (NORCO) 10-325 MG per tablet TAKE 1 TABLET BY MOUTH EVERY 8 HOURS AS NEEDED FOR PAIN  60 tablet  0  . levothyroxine (SYNTHROID, LEVOTHROID) 100 MCG tablet Take 1 tablet (100 mcg total) by mouth daily.  30 tablet  3  . lisinopril (PRINIVIL,ZESTRIL) 40 MG tablet Take 1 tablet (40 mg total) by mouth daily.  30 tablet  6  . meclizine (ANTIVERT) 25 MG tablet Take 1 tablet (25 mg total) by mouth 2 (two) times daily as needed for dizziness or nausea.  30 tablet  0  . metFORMIN (GLUCOPHAGE) 1000 MG tablet TAKE 1 TABLET IN THE MORNING AND TAKE 1 AND 1/2 TABLETS IN THE EVENING  75 tablet  3  . metoCLOPramide (REGLAN) 10 MG tablet Take 1 tablet (10 mg total) by mouth 2 (two) times daily as needed.  60 tablet  2  . metoprolol tartrate (LOPRESSOR) 25 MG tablet TAKE 1 TABLET BY MOUTH TWICE A DAY  60 tablet  3  . Multiple Vitamin (MULITIVITAMIN WITH  MINERALS) TABS Take 1 tablet by mouth daily.      . mupirocin ointment (BACTROBAN) 2 % Apply topically 3 (three) times daily.  22 g  0  . omega-3 acid ethyl esters (LOVAZA) 1 G capsule Take 2 capsules (2 g total) by mouth 2 (two) times daily.  120 capsule  11  . pioglitazone (ACTOS) 30 MG tablet Take 1 tablet (30 mg total) by mouth daily.  30 tablet  6  . polyethylene glycol powder (GLYCOLAX/MIRALAX) powder Take 17 g by mouth daily.  255 g  3  . Red Yeast Rice 600 MG CAPS Take 2 capsules by mouth daily.      . sucralfate (CARAFATE) 1 GM/10ML suspension Take 10 mLs (1 g total) by mouth 4 (four) times daily.  420 mL  1  . potassium chloride (K-DUR) 10 MEQ tablet Take 1 tablet (10 mEq total) by mouth 2 (two) times daily as needed.  180 tablet  3    Review of Systems  Constitutional: Negative.   HENT:  Negative.   Eyes: Negative.   Respiratory: Negative.        Much improved shortness of breath  Cardiovascular: Negative.   Gastrointestinal: Negative.   Musculoskeletal: Positive for arthralgias.  Skin: Negative.   Neurological: Negative.   Psychiatric/Behavioral: Negative.   All other systems reviewed and are negative.    BP 134/82  Pulse 69  Ht 5' 9"  (1.753 m)  Wt 378 lb (171.46 kg)  BMI 55.8 kg/m2  Physical Exam  Nursing note and vitals reviewed. Constitutional: He is oriented to person, place, and time. He appears well-developed and well-nourished.  obese  HENT:  Head: Normocephalic.  Nose: Nose normal.  Mouth/Throat: Oropharynx is clear and moist.  Eyes: Conjunctivae are normal. Pupils are equal, round, and reactive to light.  Neck: Normal range of motion. Neck supple. No JVD present.  Cardiovascular: Normal rate, regular rhythm, S1 normal, S2 normal, normal heart sounds and intact distal pulses.  Exam reveals no gallop and no friction rub.   No murmur heard. Pulmonary/Chest: Effort normal and breath sounds normal. No respiratory distress. He has no wheezes. He has no rales. He exhibits no tenderness.  Abdominal: Soft. Bowel sounds are normal. He exhibits no distension. There is no tenderness.  Musculoskeletal: Normal range of motion. He exhibits no edema and no tenderness.  Lymphadenopathy:    He has no cervical adenopathy.  Neurological: He is alert and oriented to person, place, and time. Coordination normal.  Skin: Skin is warm and dry. No rash noted. No erythema.  Psychiatric: He has a normal mood and affect. His behavior is normal. Judgment and thought content normal.      Assessment and Plan

## 2012-09-07 NOTE — Assessment & Plan Note (Signed)
We have encouraged continued exercise, careful diet management in an effort to lose weight. 

## 2012-09-07 NOTE — Assessment & Plan Note (Signed)
Increasing shortness of breath, weight of 9 pounds, edema (not particularly impressive today). We will increase his Lasix to twice a day. He would take his HCTZ 30 minutes before his morning Lasix. Also take potassium twice a day.  we have suggested twice a day dosing for several days until symptoms improve and then go back to daily.he will call us next week with his weights .

## 2012-09-07 NOTE — Patient Instructions (Addendum)
Please take HCTZ in the AM, Then 30 minutes later take a lasix 20 mg If you continue to have cough and leg swelling, Take another lasix 20 mg at 2 pm  Take potassium with lasix  Monitor your weight If your weight drops 5 pounds or more, 365 to 366, hold the lasix in the afternoon  Please call us if you have new issues that need to be addressed before your next appt.  Your physician wants you to follow-up in: 3 months.  You will receive a reminder letter in the mail two months in advance. If you don't receive a letter, please call our office to schedule the follow-up appointment.

## 2012-09-07 NOTE — Assessment & Plan Note (Signed)
Currently with no symptoms of angina. No further workup at this time. Continue current medication regimen. 

## 2012-09-07 NOTE — Assessment & Plan Note (Signed)
Shortness of breath multifactorial including obesity, diastolic CHF, acute on chronic

## 2012-09-07 NOTE — Assessment & Plan Note (Signed)
Blood pressure is well controlled on today's visit. No changes made to the medications. 

## 2012-09-18 ENCOUNTER — Telehealth: Payer: Self-pay | Admitting: *Deleted

## 2012-09-18 NOTE — Telephone Encounter (Signed)
Patient swelling in legs and feet

## 2012-09-18 NOTE — Telephone Encounter (Signed)
I spoke with the patient. He is taking his HCTZ about 8 am, then a lasix about 30 minutes after that. He takes his second dose of lasix around 2 pm. His last weight in the office was 378 lbs. His weight at home this morning was 373 lbs. He complains of SOB that is unchanged. He also complains of continued swelling to his feet and legs. I advised I will review with Dr. [name] and we will call him back with recommendations. He is agreeable.

## 2012-09-20 NOTE — Telephone Encounter (Signed)
Called spoke with pt swelling is a little better today, but still present.  Advised pt to increase Lasix to 25m BID x 2-3 days along with taking HCTZ in early am, take KCL BID and limit fluid intake as suggested by Dr GRockey Situ  Advised pt TCB Monday if no improvement and we will schedule pt for an Echo to evaluate further.  Pt is agreeable with plan.

## 2012-09-20 NOTE — Telephone Encounter (Signed)
Could try a few days of lasix 40 mg po BID, HCTZ early in Am  Potassium twice a day Limit fluid intake If no improvement by Monday, , will need repeat echo as may not be from fluid overload.  If not urinating much, could try torsemide on Monday 20 mg po BID with HCTZ

## 2012-09-21 ENCOUNTER — Other Ambulatory Visit: Payer: Self-pay | Admitting: Family Medicine

## 2012-09-21 HISTORY — PX: US ECHOCARDIOGRAPHY: HXRAD669

## 2012-09-26 ENCOUNTER — Encounter: Payer: Self-pay | Admitting: Radiology

## 2012-09-26 ENCOUNTER — Telehealth: Payer: Self-pay | Admitting: *Deleted

## 2012-09-26 NOTE — Telephone Encounter (Signed)
Returned call to pt.  LMTCB

## 2012-09-26 NOTE — Telephone Encounter (Signed)
Patient returned your call.

## 2012-09-26 NOTE — Telephone Encounter (Signed)
Patient legs are swelling and has blisters. Patient is taking his Lasix 55m. Please advise.

## 2012-09-26 NOTE — Telephone Encounter (Signed)
lmtcb Debbie Shawnn Bouillon RN  

## 2012-09-27 ENCOUNTER — Encounter: Payer: Self-pay | Admitting: Family Medicine

## 2012-09-27 ENCOUNTER — Ambulatory Visit (INDEPENDENT_AMBULATORY_CARE_PROVIDER_SITE_OTHER): Payer: Medicare Other | Admitting: Family Medicine

## 2012-09-27 VITALS — BP 128/62 | HR 80 | Temp 97.8°F | Wt 376.2 lb

## 2012-09-27 DIAGNOSIS — R609 Edema, unspecified: Secondary | ICD-10-CM | POA: Diagnosis not present

## 2012-09-27 DIAGNOSIS — I83009 Varicose veins of unspecified lower extremity with ulcer of unspecified site: Secondary | ICD-10-CM

## 2012-09-27 DIAGNOSIS — R0609 Other forms of dyspnea: Secondary | ICD-10-CM | POA: Diagnosis not present

## 2012-09-27 DIAGNOSIS — R0989 Other specified symptoms and signs involving the circulatory and respiratory systems: Secondary | ICD-10-CM

## 2012-09-27 DIAGNOSIS — E039 Hypothyroidism, unspecified: Secondary | ICD-10-CM | POA: Diagnosis not present

## 2012-09-27 DIAGNOSIS — L98499 Non-pressure chronic ulcer of skin of other sites with unspecified severity: Secondary | ICD-10-CM | POA: Diagnosis not present

## 2012-09-27 DIAGNOSIS — I509 Heart failure, unspecified: Secondary | ICD-10-CM

## 2012-09-27 DIAGNOSIS — E119 Type 2 diabetes mellitus without complications: Secondary | ICD-10-CM

## 2012-09-27 DIAGNOSIS — R0600 Dyspnea, unspecified: Secondary | ICD-10-CM

## 2012-09-27 DIAGNOSIS — I5032 Chronic diastolic (congestive) heart failure: Secondary | ICD-10-CM | POA: Diagnosis not present

## 2012-09-27 DIAGNOSIS — E785 Hyperlipidemia, unspecified: Secondary | ICD-10-CM

## 2012-09-27 DIAGNOSIS — L97909 Non-pressure chronic ulcer of unspecified part of unspecified lower leg with unspecified severity: Secondary | ICD-10-CM | POA: Insufficient documentation

## 2012-09-27 DIAGNOSIS — I2510 Atherosclerotic heart disease of native coronary artery without angina pectoris: Secondary | ICD-10-CM

## 2012-09-27 DIAGNOSIS — I10 Essential (primary) hypertension: Secondary | ICD-10-CM

## 2012-09-27 DIAGNOSIS — R600 Localized edema: Secondary | ICD-10-CM

## 2012-09-27 DIAGNOSIS — E669 Obesity, unspecified: Secondary | ICD-10-CM

## 2012-09-27 LAB — BASIC METABOLIC PANEL
CO2: 27 mEq/L (ref 19–32)
Calcium: 9.3 mg/dL (ref 8.4–10.5)
GFR: 66.83 mL/min (ref >60.00)
Glucose, Bld: 179 mg/dL — ABNORMAL HIGH (ref 70–99)
Potassium: 4 mEq/L (ref 3.5–5.1)
Sodium: 136 mEq/L (ref 135–145)

## 2012-09-27 LAB — MICROALBUMIN / CREATININE URINE RATIO
Creatinine,U: 159.6 mg/dL
Microalb Creat Ratio: 0.1 mg/g (ref 0.0–30.0)

## 2012-09-27 LAB — TSH: TSH: 2.1 u[IU]/mL (ref 0.35–5.50)

## 2012-09-27 LAB — BRAIN NATRIURETIC PEPTIDE: Pro B Natriuretic peptide (BNP): 7 pg/mL (ref 0.0–100.0)

## 2012-09-27 MED ORDER — TORSEMIDE 20 MG PO TABS: 20.0000 mg | ORAL_TABLET | Freq: Every day | ORAL | Status: DC

## 2012-09-27 NOTE — Patient Instructions (Addendum)
Pass by [facility] to update controlled substance agreement. Place unna boots on both legs today - keep on as long as able - but please let us know if worsening pain or any fevers develop. Pass by Marion's office to schedule ultrasound of heart. Stop lasix.  Start torsemide 33m daily for the next week to see if any improvement - we will recheck at next visit. Continue potassium. Let's decrease actos to 1/2 tablet daily. Blood work today. Return to see me in 1 week for follow up
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
## 2012-09-27 NOTE — Assessment & Plan Note (Signed)
In h/o dias CHF - check echo (last done 2012)

## 2012-09-27 NOTE — Assessment & Plan Note (Signed)
ABIs normal last year. Placed unna boots bilaterally. Elevate legs as able, continue to avoid sodium. Change lasix to torsemide 46m once daily for next week.

## 2012-09-27 NOTE — Assessment & Plan Note (Signed)
Change lasix to torsemide. Check 2d echo. If stable, likely from obesity and chronic venous insuff. Placed in unna boots today.  RTC 1 wk for recheck.

## 2012-09-27 NOTE — Assessment & Plan Note (Signed)
Slowly titrate off actos - decrease to 40m daily. Monitor sugars closely, may need to start invokana or januvia or increase glipizide to bid.

## 2012-09-28 ENCOUNTER — Other Ambulatory Visit (HOSPITAL_COMMUNITY): Payer: Self-pay | Admitting: Family Medicine

## 2012-09-28 ENCOUNTER — Other Ambulatory Visit: Payer: Self-pay | Admitting: *Deleted

## 2012-09-28 ENCOUNTER — Ambulatory Visit (HOSPITAL_COMMUNITY): Payer: Medicare Other | Attending: Family Medicine | Admitting: Radiology

## 2012-09-28 DIAGNOSIS — R0989 Other specified symptoms and signs involving the circulatory and respiratory systems: Secondary | ICD-10-CM | POA: Insufficient documentation

## 2012-09-28 DIAGNOSIS — I5032 Chronic diastolic (congestive) heart failure: Secondary | ICD-10-CM

## 2012-09-28 DIAGNOSIS — E119 Type 2 diabetes mellitus without complications: Secondary | ICD-10-CM | POA: Insufficient documentation

## 2012-09-28 DIAGNOSIS — E785 Hyperlipidemia, unspecified: Secondary | ICD-10-CM | POA: Diagnosis not present

## 2012-09-28 DIAGNOSIS — R0600 Dyspnea, unspecified: Secondary | ICD-10-CM

## 2012-09-28 DIAGNOSIS — R079 Chest pain, unspecified: Secondary | ICD-10-CM

## 2012-09-28 DIAGNOSIS — K219 Gastro-esophageal reflux disease without esophagitis: Secondary | ICD-10-CM | POA: Diagnosis not present

## 2012-09-28 DIAGNOSIS — I10 Essential (primary) hypertension: Secondary | ICD-10-CM | POA: Diagnosis not present

## 2012-09-28 DIAGNOSIS — R609 Edema, unspecified: Secondary | ICD-10-CM | POA: Insufficient documentation

## 2012-09-28 DIAGNOSIS — G4733 Obstructive sleep apnea (adult) (pediatric): Secondary | ICD-10-CM | POA: Diagnosis not present

## 2012-09-28 DIAGNOSIS — E039 Hypothyroidism, unspecified: Secondary | ICD-10-CM | POA: Diagnosis not present

## 2012-09-28 DIAGNOSIS — R0609 Other forms of dyspnea: Secondary | ICD-10-CM | POA: Diagnosis not present

## 2012-09-28 DIAGNOSIS — R072 Precordial pain: Secondary | ICD-10-CM

## 2012-09-28 MED ORDER — PERFLUTREN PROTEIN A MICROSPH IV SUSP
3.0000 mL | Freq: Once | INTRAVENOUS | Status: AC
  Administered 2012-09-28: 3 mL via INTRAVENOUS

## 2012-09-28 NOTE — Progress Notes (Signed)
Echocardiogram performed with [employer].  

## 2012-09-28 NOTE — Progress Notes (Signed)
IV started in right hand by Eliezer Lofts at [phone] with 22 gauge a/g

## 2012-10-01 ENCOUNTER — Encounter: Payer: Self-pay | Admitting: *Deleted

## 2012-10-01 ENCOUNTER — Encounter: Payer: Self-pay | Admitting: Family Medicine

## 2012-10-01 ENCOUNTER — Telehealth: Payer: Self-pay

## 2012-10-01 DIAGNOSIS — Z79899 Other long term (current) drug therapy: Secondary | ICD-10-CM | POA: Diagnosis not present

## 2012-10-01 NOTE — Telephone Encounter (Signed)
Patient notified and appt changed for tomorrow.

## 2012-10-01 NOTE — Telephone Encounter (Signed)
Pt saw Dr Darnell Level at [facility] on 09/27/12 see note in EPIC.  LE edema addressed at [facility].  Has f/u with them in 1 week.

## 2012-10-01 NOTE — Telephone Encounter (Signed)
I recommend keeping on for now if able, if would like, can schedule f/u sooner - tomorrow . And can reassess boot use at that time.

## 2012-10-01 NOTE — Telephone Encounter (Signed)
Pt seen 09/27/12 and unna boots applied; pt has f/u appt on 10/04/12. Pt wants to know if he can remove unna boots; pt not having pain and no swelling in legs now and no fever. Pt said the boots are making him nervous and he cannot stand to keep boots on (that was only explanation why pt wants to take off boots); advised pt AVS said to keep on as long as can.Please advise.

## 2012-10-02 ENCOUNTER — Encounter: Payer: Self-pay | Admitting: Family Medicine

## 2012-10-02 ENCOUNTER — Ambulatory Visit (INDEPENDENT_AMBULATORY_CARE_PROVIDER_SITE_OTHER): Payer: Medicare Other | Admitting: Family Medicine

## 2012-10-02 VITALS — BP 122/62 | HR 90 | Temp 97.6°F | Wt 374.5 lb

## 2012-10-02 DIAGNOSIS — R609 Edema, unspecified: Secondary | ICD-10-CM | POA: Diagnosis not present

## 2012-10-02 DIAGNOSIS — L97901 Non-pressure chronic ulcer of unspecified part of unspecified lower leg limited to breakdown of skin: Secondary | ICD-10-CM

## 2012-10-02 DIAGNOSIS — L98499 Non-pressure chronic ulcer of skin of other sites with unspecified severity: Secondary | ICD-10-CM | POA: Diagnosis not present

## 2012-10-02 DIAGNOSIS — R600 Localized edema: Secondary | ICD-10-CM

## 2012-10-02 DIAGNOSIS — I83009 Varicose veins of unspecified lower extremity with ulcer of unspecified site: Secondary | ICD-10-CM

## 2012-10-02 MED ORDER — TORSEMIDE 20 MG PO TABS: 20.0000 mg | ORAL_TABLET | Freq: Every day | ORAL | Status: DC

## 2012-10-02 NOTE — Patient Instructions (Signed)
We will wrap legs with smaller wrap today. Return on Monday for recheck., let me know sooner if questions. Continue torsemide daily for now. Increase glipizide 36m to twice daily.

## 2012-10-02 NOTE — Assessment & Plan Note (Addendum)
Anticipate morbid obesity/chronic venous insufficiency related edema as recent echo stable. Significant improvement on torsemide (for last 5 days).  Continue daily for another week.  rtc on Monday for recheck, check Cr at that time. Continue elevating legs and staying well hydrated. Pt agrees with plan.

## 2012-10-02 NOTE — Assessment & Plan Note (Signed)
Overall healing.  Pt did not tolerate unna boot very well 2/2 increased anxiety.  Will do trial off rpt unna boot for the next week, reassess on Monday - if not progressing, will recommend reapply unna boot for another week. Pt aware and agrees.

## 2012-10-02 NOTE — Progress Notes (Signed)
  Subjective:    Patient ID: Chad [name], male    DOB: 1955/10/01, 57 y.o.   MRN: [id number]  HPI CC: f/u unna boot  Seen here last week (09/27/2012) with concern for chronic venous insufficiency with venous ulcer.  torsemide 45m daily was started (after lasix 486mbid was not effective) as well as pt placed in [employer] slowly continued titration off actos (down to 1529maily).  Overall feeling better.  Noted weight loss and decreased leg swelling after unna boot placement. Wt Readings from Last 3 Encounters:  10/02/12 374 lb 8 oz (169.872 kg)  09/27/12 376 lb 4 oz (170.666 kg)  09/07/12 378 lb (171.46 kg)  Body mass index is 55.28 kg/(m^2).  Past Medical History  Diagnosis Date  . Arthritis     mainly in R shoulder, s/p shots  . T2DM (type 2 diabetes mellitus) 1990s  . HTN (hypertension)   . HLD (hyperlipidemia)     statin caused leg cramps  . GERD (gastroesophageal reflux disease)     severe, h/o gastritis and GI bleed, [facility]  . Seasonal allergies   . Colon polyp 2008  . Hypothyroid   . Hearing impairment     hearing aides  . Gastric bypass status for obesity 1985  . OSA (obstructive sleep apnea)     unable to use CPAP as of last try 2/2 h/o tracheostomy?  . Morbid obesity   . RBBB   . Bulging lumbar disc   . Bone spur     L4 L5  . Narrowing of lumbar spine   . Colon polyps     2009  . Right ear pain     s/p eval by ENT - thought TMJ referred pain and sent to oral surg for dental splint  . Diastolic CHF, chronic 04/06/05/6808  Review of Systems Per HPI    Objective:   Physical Exam  Nursing note and vitals reviewed. Constitutional: He appears well-developed and well-nourished. No distress.  Musculoskeletal: He exhibits edema (nonpitting - improved).  Unna boot removed.  Developing scabs present.  Decreased swelling of lower legs bilaterally noted. Mild hyperpigmentary change of lower legs Nonpitting edema remains.        Assessment & Plan:

## 2012-10-03 ENCOUNTER — Other Ambulatory Visit: Payer: Self-pay | Admitting: Family Medicine

## 2012-10-03 NOTE — Telephone Encounter (Signed)
plz phone in. 

## 2012-10-04 ENCOUNTER — Ambulatory Visit: Payer: Medicare Other | Admitting: Family Medicine

## 2012-10-04 NOTE — Telephone Encounter (Signed)
Rx called in as directed.   

## 2012-10-04 NOTE — Telephone Encounter (Signed)
plz phone in if not already done.

## 2012-10-05 ENCOUNTER — Encounter: Payer: Self-pay | Admitting: Family Medicine

## 2012-10-08 ENCOUNTER — Ambulatory Visit (INDEPENDENT_AMBULATORY_CARE_PROVIDER_SITE_OTHER): Payer: Medicare Other | Admitting: Family Medicine

## 2012-10-08 ENCOUNTER — Telehealth: Payer: Self-pay | Admitting: Family Medicine

## 2012-10-08 ENCOUNTER — Encounter: Payer: Self-pay | Admitting: Family Medicine

## 2012-10-08 VITALS — BP 124/68 | HR 76 | Temp 98.5°F | Wt 371.2 lb

## 2012-10-08 DIAGNOSIS — I5032 Chronic diastolic (congestive) heart failure: Secondary | ICD-10-CM

## 2012-10-08 DIAGNOSIS — R609 Edema, unspecified: Secondary | ICD-10-CM

## 2012-10-08 DIAGNOSIS — I509 Heart failure, unspecified: Secondary | ICD-10-CM | POA: Diagnosis not present

## 2012-10-08 DIAGNOSIS — R600 Localized edema: Secondary | ICD-10-CM

## 2012-10-08 DIAGNOSIS — L98499 Non-pressure chronic ulcer of skin of other sites with unspecified severity: Secondary | ICD-10-CM

## 2012-10-08 LAB — BASIC METABOLIC PANEL
CO2: 24 mEq/L (ref 19–32)
Calcium: 9.4 mg/dL (ref 8.4–10.5)
Chloride: 100 mEq/L (ref 96–112)
Sodium: 134 mEq/L — ABNORMAL LOW (ref 135–145)

## 2012-10-08 MED ORDER — CEPHALEXIN 500 MG PO CAPS: 500.0000 mg | ORAL_CAPSULE | Freq: Three times a day (TID) | ORAL | Status: DC

## 2012-10-08 NOTE — Assessment & Plan Note (Signed)
R leg has healed.  L leg residual shallow ulcers with now abrasions. Mild surrounding erythema but without concern for purulent infection - will add keflex 56m tid x 7 days. I have replaced smaller unna boot on left leg.   rtc 1 wk for rcececk

## 2012-10-08 NOTE — Progress Notes (Signed)
  Subjective:    Patient ID: Chad [name], male    DOB: June 18, 1955, 57 y.o.   MRN: [id number]  HPI CC: f/u venous wound  Seen here 09/27/2012 with concern for chronic venous insufficiency with bilateral venous ulcer. Torsemide 61m daily was started (after lasix 417mbid was not effective) as well as pt placed in [facility] improve, but pt had trouble tolerating unna boot. Last visit wound was dressed with antibiotic ointment and nonstick gauze.  Pt kept this dressing on until yesterday.  Overall improving - R leg has healed and skin has closed.  However L leg noticing has not healed completely.  Taking torsemide 207mvery day.  Finds works well for diuresis. Continued weight loss noted. Wt Readings from Last 3 Encounters:  10/08/12 371 lb 4 oz (168.398 kg)  10/02/12 374 lb 8 oz (169.872 kg)  09/27/12 376 lb 4 oz (170.666 kg)    Review of Systems Per HPI    Objective:   Physical Exam  Nursing note and vitals reviewed. Constitutional: He appears well-developed and well-nourished. No distress.  Musculoskeletal: He exhibits edema (tr).  2+ DP bilaterally R anterior shin with healed ulcer - skin closed L anterior shin with poorly healing small superficial ulcer, mild surrounding erythema.       Assessment & Plan:

## 2012-10-08 NOTE — Patient Instructions (Addendum)
Blood work today. Will continue torsemide 57m every other day (take with potassium). Unna boot applied today.  Return on Monday for recheck. Right leg is looking better! Take antibiotic (sent to pharmacy)

## 2012-10-08 NOTE — Assessment & Plan Note (Signed)
Significant improvement with healthy lifestyle changes they've implemented. Anticipate obesity and CVI related. Will recommend he start torsemide QOD regularly. Check Cr today.

## 2012-10-08 NOTE — Telephone Encounter (Signed)
Opened in error

## 2012-10-15 ENCOUNTER — Ambulatory Visit (INDEPENDENT_AMBULATORY_CARE_PROVIDER_SITE_OTHER): Payer: Medicare Other | Admitting: Family Medicine

## 2012-10-15 ENCOUNTER — Encounter: Payer: Self-pay | Admitting: Family Medicine

## 2012-10-15 VITALS — BP 118/68 | HR 68 | Temp 98.2°F | Wt 375.2 lb

## 2012-10-15 DIAGNOSIS — I83009 Varicose veins of unspecified lower extremity with ulcer of unspecified site: Secondary | ICD-10-CM

## 2012-10-15 DIAGNOSIS — L98499 Non-pressure chronic ulcer of skin of other sites with unspecified severity: Secondary | ICD-10-CM | POA: Diagnosis not present

## 2012-10-15 NOTE — Assessment & Plan Note (Signed)
Continue treating with vaseline ointment.  Overall healing well.  Finishing keflex course.   Stop torsemide, restart lasix 80m daily.  Continue potassium and hctz daily Update if wound doesn't continue to heal well. Pt agrees with plan.

## 2012-10-15 NOTE — Patient Instructions (Signed)
Let's stop torsemide.  Restart lasix 56m daily.  Continue hydrochlorothiazide 237mdaily. May use plain vaseline for wounds. Take one potassium pill daily with lasix Good to see you - let me know if leg wounds not healing well.

## 2012-10-15 NOTE — Progress Notes (Signed)
  Subjective:    Patient ID: Chad [name], male    DOB: [birth year], 57 y.o.   MRN: [id number]  HPI CC: recheck leg wound  Seen here 09/27/2012 with concern for chronic venous insufficiency with bilateral venous ulcer. Torsemide 68m daily was started (after lasix 424mbid was not effective) as well as pt placed in [facility] improve, but pt had trouble tolerating unna boot.  Last visit placed abbreviated unna boot, kept on for the last week.  Wt Readings from Last 3 Encounters:  10/15/12 375 lb 4 oz (170.212 kg)  10/08/12 371 lb 4 oz (168.398 kg)  10/02/12 374 lb 8 oz (169.872 kg)   Lab Results  Component Value Date   CREATININE 1.3 10/08/2012    Past Medical History  Diagnosis Date  . Arthritis     mainly in R shoulder, s/p shots  . T2DM (type 2 diabetes mellitus) 1990s  . HTN (hypertension)   . HLD (hyperlipidemia)     statin caused leg cramps  . GERD (gastroesophageal reflux disease)     severe, h/o gastritis and GI bleed[facility]  . Seasonal allergies   . Colon polyp 2008  . Hypothyroid   . Hearing impairment     hearing aides  . Gastric bypass status for obesity 1985  . OSA (obstructive sleep apnea)     unable to use CPAP as of last try 2/2 h/o tracheostomy?  . Morbid obesity   . RBBB   . Bulging lumbar disc   . Bone spur     L4 L5  . Narrowing of lumbar spine   . Colon polyps     2009  . Right ear pain     s/p eval by ENT - thought TMJ referred pain and sent to oral surg for dental splint  . Diastolic CHF, chronic 03/25/93/1884   Review of Systems Per HPI    Objective:   Physical Exam  Nursing note and vitals reviewed. Constitutional: He appears well-developed and well-nourished. No distress.  Musculoskeletal: He exhibits edema (nonpitting).  Improved edema. Nonpitting now. Healing scabs bilateral anterior shins. No surrounding erythema       Assessment & Plan:

## 2012-10-17 ENCOUNTER — Telehealth: Payer: Self-pay | Admitting: *Deleted

## 2012-10-17 NOTE — Telephone Encounter (Signed)
Form for diabetic testing supplies in your IN box for completion. 

## 2012-10-18 NOTE — Telephone Encounter (Signed)
Filled and placed in my out box.

## 2012-10-24 ENCOUNTER — Other Ambulatory Visit: Payer: Self-pay | Admitting: Family Medicine

## 2012-11-01 ENCOUNTER — Other Ambulatory Visit: Payer: Self-pay | Admitting: Family Medicine

## 2012-11-01 NOTE — Telephone Encounter (Signed)
Ok to refill 

## 2012-11-01 NOTE — Telephone Encounter (Signed)
plz phoen in.

## 2012-11-01 NOTE — Telephone Encounter (Signed)
Rx called in as directed.   

## 2012-11-15 ENCOUNTER — Ambulatory Visit (INDEPENDENT_AMBULATORY_CARE_PROVIDER_SITE_OTHER): Payer: Medicare Other

## 2012-11-15 DIAGNOSIS — Z23 Encounter for immunization: Secondary | ICD-10-CM

## 2012-11-22 ENCOUNTER — Other Ambulatory Visit: Payer: Self-pay | Admitting: Family Medicine

## 2012-11-23 ENCOUNTER — Other Ambulatory Visit: Payer: Self-pay | Admitting: Family Medicine

## 2012-11-29 ENCOUNTER — Other Ambulatory Visit: Payer: Self-pay | Admitting: Family Medicine

## 2012-11-29 NOTE — Telephone Encounter (Deleted)
plz phone in. 

## 2012-11-29 NOTE — Telephone Encounter (Signed)
Chad [name] left v/m requesting cb when hydrocodone rx ready for pick up. (Chad Aversa wants to pick up her rx at same time).

## 2012-11-29 NOTE — Telephone Encounter (Signed)
Printed and placed in Kim's box.

## 2012-11-29 NOTE — Telephone Encounter (Signed)
Patient notified and Rx placed up front for pick up.

## 2012-12-10 ENCOUNTER — Ambulatory Visit (INDEPENDENT_AMBULATORY_CARE_PROVIDER_SITE_OTHER): Payer: Medicare Other | Admitting: Cardiovascular Disease

## 2012-12-10 ENCOUNTER — Encounter: Payer: Self-pay | Admitting: Cardiovascular Disease

## 2012-12-10 VITALS — BP 128/60 | HR 68 | Ht 69.0 in | Wt 382.5 lb

## 2012-12-10 DIAGNOSIS — E785 Hyperlipidemia, unspecified: Secondary | ICD-10-CM | POA: Diagnosis not present

## 2012-12-10 DIAGNOSIS — R0609 Other forms of dyspnea: Secondary | ICD-10-CM

## 2012-12-10 DIAGNOSIS — E669 Obesity, unspecified: Secondary | ICD-10-CM

## 2012-12-10 DIAGNOSIS — R0600 Dyspnea, unspecified: Secondary | ICD-10-CM

## 2012-12-10 DIAGNOSIS — I509 Heart failure, unspecified: Secondary | ICD-10-CM

## 2012-12-10 DIAGNOSIS — I10 Essential (primary) hypertension: Secondary | ICD-10-CM | POA: Diagnosis not present

## 2012-12-10 DIAGNOSIS — R0602 Shortness of breath: Secondary | ICD-10-CM | POA: Diagnosis not present

## 2012-12-10 DIAGNOSIS — R0989 Other specified symptoms and signs involving the circulatory and respiratory systems: Secondary | ICD-10-CM

## 2012-12-10 DIAGNOSIS — I5032 Chronic diastolic (congestive) heart failure: Secondary | ICD-10-CM | POA: Diagnosis not present

## 2012-12-10 DIAGNOSIS — G4733 Obstructive sleep apnea (adult) (pediatric): Secondary | ICD-10-CM

## 2012-12-10 NOTE — Assessment & Plan Note (Signed)
We have encouraged continued exercise, careful diet management in an effort to lose weight. He may be a candidate for gastric bypass

## 2012-12-10 NOTE — Assessment & Plan Note (Signed)
Shortness of breath likely multifactorial including obesity, deconditioning, chronic diastolic CHF. Recommended he lose weight, increase his Lasix for weight over 370 pounds. Suspect dry weight is probably lower than 365 pounds by his home scale.

## 2012-12-10 NOTE — Patient Instructions (Signed)
You are doing well.  Take extra lasix at lunch for weight gain and for weight greater than 370 pounds  Please call us if you have new issues that need to be addressed before your next appt.  Your physician wants you to follow-up in: 3 months.  You will receive a reminder letter in the mail two months in advance. If you don't receive a letter, please call our office to schedule the follow-up appointment.

## 2012-12-10 NOTE — Assessment & Plan Note (Signed)
Blood pressure is well controlled on today's visit. No changes made to the medications. 

## 2012-12-10 NOTE — Progress Notes (Signed)
Patient ID: Chad [name], male    DOB: 10-Jan-1956, 57 y.o.   MRN: [id number]  HPI Comments: Chad [name] is a very pleasant 57 year old gentleman with a history of morbid obesity,  obstructive sleep apnea not on CPAP, history of gastric bypass, bladder history of throat surgery for sleep apnea though the details are unavailable, history of GERD who sleeps on a wedge who initially presented for abnormal EKG. He had a cardiac catheterization in March of 2012 showing mild disease, moderate calcifications with no intervention needed. Last hemoglobin A1c 6.4  History of diastolic CHF, acute on chronic.  In the past, he was given Lasix twice a day with significant improvement in his weight, edema.  In followup today, his leg ulcers have healed in his lower extremity edema has improved, also he has completed wearing his leg wraps.  He does have chronic shortness of breath. Weight has been trending up over the past year. Initially weight was 369, then up to 378, now up to 382.  He reports that he takes Lasix every day. He's not taking torsemide as he was in the past.  In the past, he was not able to tolerate CPAP and returned the machine many years ago. He is not interested in having any repeat testing.  He has tried cholesterol medications in the past including Lipitor and he reports it has caused profound cramping in his legs to the point where he has gone to the hospital 3 times.  EKG shows normal sinus rhythm with rate 68 beats per minute with  right bundle branch block  Outpatient Encounter Prescriptions as of 12/10/2012  Medication Sig Dispense Refill  . aspirin EC 81 MG tablet Take 162 mg by mouth daily.        . Flaxseed, Linseed, (FLAX SEED OIL) 1000 MG CAPS Take 1 capsule by mouth 2 (two) times daily.        . furosemide (LASIX) 20 MG tablet Take 1 tablet (20 mg total) by mouth daily.      Marland Kitchen glipiZIDE (GLUCOTROL XL) 10 MG 24 hr tablet Take 10 mg by mouth 2 (two) times daily.       .  hydrochlorothiazide (HYDRODIURIL) 25 MG tablet TAKE 1 TABLET BY MOUTH ONCE A DAY  30 tablet  3  . HYDROcodone-acetaminophen (NORCO) 10-325 MG per tablet TAKE 1 TABLET BY MOUTH EVERY 8 HOURS AS NEEDED FOR PAIN  60 tablet  0  . levothyroxine (SYNTHROID, LEVOTHROID) 100 MCG tablet Take 1 tablet (100 mcg total) by mouth daily.  30 tablet  3  . lisinopril (PRINIVIL,ZESTRIL) 40 MG tablet TAKE 1 TABLET BY MOUTH ONCE A DAY  30 tablet  6  . LOVAZA 1 G capsule TAKE 2 CAPSULES BY MOUTH TWICE A DAY  120 capsule  5  . meclizine (ANTIVERT) 25 MG tablet Take 1 tablet (25 mg total) by mouth 2 (two) times daily as needed for dizziness or nausea.  30 tablet  0  . metFORMIN (GLUCOPHAGE) 1000 MG tablet TAKE 1 TABLET IN THE MORNING AND TAKE 1 AND 1/2 TABLETS IN THE EVENING  75 tablet  3  . metoCLOPramide (REGLAN) 10 MG tablet TAKE 1 TABLET BY MOUTH TWICE A DAY AS NEEDED  60 tablet  3  . metoprolol tartrate (LOPRESSOR) 25 MG tablet TAKE 1 TABLET BY MOUTH TWICE A DAY  60 tablet  3  . Multiple Vitamin (MULITIVITAMIN WITH MINERALS) TABS Take 1 tablet by mouth daily.      Marland Kitchen NEXIUM  40 MG capsule TAKE 1 CAPSULE BY MOUTH TWICE DAILY  60 capsule  6  . pioglitazone (ACTOS) 30 MG tablet Take 15 mg by mouth daily.      . polyethylene glycol powder (GLYCOLAX/MIRALAX) powder Take 17 g by mouth daily.  255 g  3  . potassium chloride (K-DUR) 10 MEQ tablet Take 1 tablet (10 mEq total) by mouth 2 (two) times daily as needed.  180 tablet  3  . Red Yeast Rice 600 MG CAPS Take 2 capsules by mouth daily.      . sucralfate (CARAFATE) 1 GM/10ML suspension Take 10 mLs (1 g total) by mouth 4 (four) times daily.  420 mL  1    Review of Systems  Constitutional: Negative.   HENT: Negative.   Eyes: Negative.   Respiratory: Positive for shortness of breath.        Much improved shortness of breath  Cardiovascular: Positive for leg swelling.  Gastrointestinal: Negative.   Endocrine: Negative.   Musculoskeletal: Positive for arthralgias.   Skin: Negative.   Allergic/Immunologic: Negative.   Neurological: Negative.   Hematological: Negative.   Psychiatric/Behavioral: Negative.   All other systems reviewed and are negative.    BP 128/60  Pulse 68  Ht 5' 9"  (1.753 m)  Wt 382 lb 8 oz (173.501 kg)  BMI 56.46 kg/m2  Physical Exam  Nursing note and vitals reviewed. Constitutional: He is oriented to person, place, and time. He appears well-developed and well-nourished.  Morbidly obese  HENT:  Head: Normocephalic.  Nose: Nose normal.  Mouth/Throat: Oropharynx is clear and moist.  Eyes: Conjunctivae are normal. Pupils are equal, round, and reactive to light.  Neck: Normal range of motion. Neck supple. No JVD present.  Cardiovascular: Normal rate, regular rhythm, S1 normal, S2 normal, normal heart sounds and intact distal pulses.  Exam reveals no gallop and no friction rub.   No murmur heard. Pulmonary/Chest: Effort normal and breath sounds normal. No respiratory distress. He has no wheezes. He has no rales. He exhibits no tenderness.  Abdominal: Soft. Bowel sounds are normal. He exhibits no distension. There is no tenderness.  Musculoskeletal: Normal range of motion. He exhibits no edema and no tenderness.  Lymphadenopathy:    He has no cervical adenopathy.  Neurological: He is alert and oriented to person, place, and time. Coordination normal.  Skin: Skin is warm and dry. No rash noted. No erythema.  Psychiatric: He has a normal mood and affect. His behavior is normal. Judgment and thought content normal.      Assessment and Plan

## 2012-12-10 NOTE — Assessment & Plan Note (Signed)
He does not want to wear CPAP.

## 2012-12-10 NOTE — Assessment & Plan Note (Signed)
I'm concerned about weight gain over the past year. He has trace pitting edema. Weight is up 4 pounds from prior clinic visit. We have encouraged him to closely monitor his weight and take extra Lasix for home weight more than 370 pounds. His weight continues to trend up, we may need to restart torsemide.

## 2012-12-10 NOTE — Assessment & Plan Note (Signed)
Unable to tolerate statins. Recommended weight loss. No known CAD.

## 2012-12-21 ENCOUNTER — Other Ambulatory Visit: Payer: Self-pay | Admitting: Family Medicine

## 2012-12-27 ENCOUNTER — Other Ambulatory Visit: Payer: Self-pay | Admitting: Family Medicine

## 2012-12-27 NOTE — Telephone Encounter (Signed)
Printed and placed in Kim's chart.

## 2012-12-27 NOTE — Telephone Encounter (Signed)
Message left notifying patient and Rx placed up front for pick up.

## 2012-12-27 NOTE — Telephone Encounter (Signed)
Ok to refill 

## 2013-01-04 ENCOUNTER — Ambulatory Visit (INDEPENDENT_AMBULATORY_CARE_PROVIDER_SITE_OTHER): Payer: Medicare Other | Admitting: Family Medicine

## 2013-01-04 ENCOUNTER — Encounter: Payer: Self-pay | Admitting: Family Medicine

## 2013-01-04 ENCOUNTER — Other Ambulatory Visit: Payer: Self-pay | Admitting: Family Medicine

## 2013-01-04 VITALS — BP 132/84 | HR 48 | Temp 98.1°F | Wt 382.2 lb

## 2013-01-04 DIAGNOSIS — I498 Other specified cardiac arrhythmias: Secondary | ICD-10-CM

## 2013-01-04 DIAGNOSIS — J0121 Acute recurrent ethmoidal sinusitis: Secondary | ICD-10-CM | POA: Insufficient documentation

## 2013-01-04 DIAGNOSIS — R001 Bradycardia, unspecified: Secondary | ICD-10-CM

## 2013-01-04 DIAGNOSIS — J019 Acute sinusitis, unspecified: Secondary | ICD-10-CM

## 2013-01-04 MED ORDER — AMOXICILLIN-POT CLAVULANATE 875-125 MG PO TABS: 1.0000 | ORAL_TABLET | Freq: Two times a day (BID) | ORAL | Status: AC

## 2013-01-04 MED ORDER — HYDROCOD POLST-CHLORPHEN POLST 10-8 MG/5ML PO LQCR: 5.0000 mL | Freq: Every evening | ORAL | Status: DC | PRN

## 2013-01-04 NOTE — Progress Notes (Signed)
  Subjective:    Patient ID: Chad [name], male    DOB: Feb 12, 1956, 57 y.o.   MRN: [id number]  HPI CC: URI sxs  1 wk h/o nasal congestion, cough productive of mucous, progressively worsening over last 3 days.  Last night had coughing fits treated with hycodan cough syrup.  Today with bad headache.  Feels congested in head and chest.  Appetite down.    No fevers/chills, abd pain, nausea, ear or tooth pain.  Taking hycodan cough syrup for cough.  No h/o asthma No sick contacts at home.  Past Medical History  Diagnosis Date  . Arthritis     mainly in R shoulder, s/p shots  . T2DM (type 2 diabetes mellitus) 1990s  . HTN (hypertension)   . HLD (hyperlipidemia)     statin caused leg cramps  . GERD (gastroesophageal reflux disease)     severe, h/o gastritis and GI bleed, per pt normal EGD at [facility] 2008  . Seasonal allergies   . Colon polyp 2008  . Hypothyroid   . Hearing impairment     hearing aides  . Gastric bypass status for obesity 1985  . OSA (obstructive sleep apnea)     unable to use CPAP as of last try 2/2 h/o tracheostomy?  . Morbid obesity   . RBBB   . Bulging lumbar disc   . Bone spur     L4 L5  . Narrowing of lumbar spine   . Colon polyps     2009  . Right ear pain     s/p eval by ENT - thought TMJ referred pain and sent to oral surg for dental splint  . Diastolic CHF, chronic 9/79/8921     Review of Systems Per HPI    Objective:   Physical Exam  Nursing note and vitals reviewed. Constitutional: He appears well-developed and well-nourished. No distress.  HENT:  Head: Normocephalic and atraumatic.  Right Ear: Hearing, external ear and ear canal normal.  Left Ear: Hearing, external ear and ear canal normal.  Nose: Mucosal edema present. No rhinorrhea. Right sinus exhibits no maxillary sinus tenderness and no frontal sinus tenderness. Left sinus exhibits no maxillary sinus tenderness and no frontal sinus tenderness.  Mouth/Throat: Uvula is midline, oropharynx  is clear and moist and mucous membranes are normal. No oropharyngeal exudate, posterior oropharyngeal edema, posterior oropharyngeal erythema or tonsillar abscesses.  TM deformity bilaterally but no erythema or bulge  Eyes: Conjunctivae and EOM are normal. Pupils are equal, round, and reactive to light. No scleral icterus.  Neck: Normal range of motion. Neck supple.  Cardiovascular: Normal rate, regular rhythm, normal heart sounds and intact distal pulses.   No murmur heard. Pulmonary/Chest: Effort normal and breath sounds normal. No respiratory distress. He has no wheezes. He has no rales.  Lymphadenopathy:    He has no cervical adenopathy.  Skin: Skin is warm and dry. No rash noted.       Assessment & Plan:

## 2013-01-04 NOTE — Patient Instructions (Signed)
You have a sinus infection. Take medicine as prescribed: augmentin for 10 day course Push fluids and plenty of rest. Nasal saline irrigation or neti pot to help drain sinuses. May use plain mucinex with plenty of fluid to help mobilize mucous. For now decrease metoprolol to 1/2 tablet twice daily. Let us know if fever >101.5, trouble opening/closing mouth, difficulty swallowing, or worsening - you may need to be seen again.

## 2013-01-04 NOTE — Assessment & Plan Note (Signed)
Noted today , ?related to feeling ill.  Will decrease metoprolol to 12.74m bid.

## 2013-01-04 NOTE — Progress Notes (Signed)
Pre-visit discussion using our clinic review tool. No additional management support is needed unless otherwise documented below in the visit note.  

## 2013-01-04 NOTE — Assessment & Plan Note (Addendum)
Given duration, progression, and comorbidities, will treat aggressively with augmentin course as well as tussionex for cough at night. Red flags to return discussed.

## 2013-01-15 ENCOUNTER — Ambulatory Visit (INDEPENDENT_AMBULATORY_CARE_PROVIDER_SITE_OTHER): Payer: Medicare Other | Admitting: Family Medicine

## 2013-01-15 ENCOUNTER — Encounter: Payer: Self-pay | Admitting: Family Medicine

## 2013-01-15 VITALS — BP 128/76 | HR 64 | Temp 97.9°F | Wt 376.5 lb

## 2013-01-15 DIAGNOSIS — E039 Hypothyroidism, unspecified: Secondary | ICD-10-CM

## 2013-01-15 DIAGNOSIS — I499 Cardiac arrhythmia, unspecified: Secondary | ICD-10-CM

## 2013-01-15 DIAGNOSIS — I10 Essential (primary) hypertension: Secondary | ICD-10-CM

## 2013-01-15 DIAGNOSIS — J019 Acute sinusitis, unspecified: Secondary | ICD-10-CM

## 2013-01-15 DIAGNOSIS — E785 Hyperlipidemia, unspecified: Secondary | ICD-10-CM

## 2013-01-15 DIAGNOSIS — E119 Type 2 diabetes mellitus without complications: Secondary | ICD-10-CM | POA: Diagnosis not present

## 2013-01-15 LAB — HEMOGLOBIN A1C: Hgb A1c MFr Bld: 8.4 % — ABNORMAL HIGH (ref 4.6–6.5)

## 2013-01-15 LAB — LIPID PANEL
HDL: 32.3 mg/dL — ABNORMAL LOW (ref >39.00)
Triglycerides: 146 mg/dL (ref 0.0–149.0)
VLDL: 29.2 mg/dL (ref 0.0–40.0)

## 2013-01-15 LAB — BASIC METABOLIC PANEL
CO2: 26 mEq/L (ref 19–32)
GFR: 77.13 mL/min (ref >60.00)
Glucose, Bld: 206 mg/dL — ABNORMAL HIGH (ref 70–99)
Potassium: 3.9 mEq/L (ref 3.5–5.1)
Sodium: 132 mEq/L — ABNORMAL LOW (ref 135–145)

## 2013-01-15 MED ORDER — HYDROCODONE-HOMATROPINE 5-1.5 MG/5ML PO SYRP: 5.0000 mL | ORAL_SOLUTION | Freq: Two times a day (BID) | ORAL | Status: DC | PRN

## 2013-01-15 NOTE — Assessment & Plan Note (Signed)
Resolving - provided with hycodan cough syrup hopeful for cheaper alternative.

## 2013-01-15 NOTE — Assessment & Plan Note (Signed)
On RYR. Check FLP today.

## 2013-01-15 NOTE — Progress Notes (Signed)
Pre-visit discussion using our clinic review tool. No additional management support is needed unless otherwise documented below in the visit note.  

## 2013-01-15 NOTE — Assessment & Plan Note (Signed)
Lab Results  Component Value Date   TSH 2.10 09/27/2012

## 2013-01-15 NOTE — Assessment & Plan Note (Signed)
Chronic, stable. Recheck A1c today. No changes to meds.

## 2013-01-15 NOTE — Assessment & Plan Note (Signed)
Chronic, stable. continue meds.

## 2013-01-15 NOTE — Progress Notes (Signed)
  Subjective:    Patient ID: Chad [name], male    DOB: [birth year], 58 y.o.   MRN: [id number]  HPI CC: 6 mo f/u  Seen here 10 days ago with dx sinusitis, treated with augmentin and tussionex which was too expensive.  Overall doing better from this, requests different cheaper cough syrup.  Noting roaring in ears.  At that time noted to be bradycardic to 48 so metoprolol was decreased to 12.65m bid.    Diastolic CHF - compliant with lasix, denies leg swelling.  + dyspnea at baseline. Wt Readings from Last 3 Encounters:  01/15/13 376 lb 8 oz (170.779 kg)  01/04/13 382 lb 4 oz (173.387 kg)  12/10/12 382 lb 8 oz (173.501 kg)    DM - sugars running elevated over last week.  200-250 fasting.  Checks once to twice a day.  Foot exam today.  No low sugars.  No paresthesias.    Known RBBB. EKG done today for irregular heart beat - frequent PVCs, RBBB with RAD, borderline PR, rate of 80s.  Past Medical History  Diagnosis Date  . Arthritis     mainly in R shoulder, s/p shots  . T2DM (type 2 diabetes mellitus) 1990s  . HTN (hypertension)   . HLD (hyperlipidemia)     statin caused leg cramps  . GERD (gastroesophageal reflux disease)     severe, h/o gastritis and GI bleed[facility]  . Seasonal allergies   . Colon polyp 2008  . Hypothyroid   . Hearing impairment     hearing aides  . Gastric bypass status for obesity 1985  . OSA (obstructive sleep apnea)     unable to use CPAP as of last try 2/2 h/o tracheostomy?  . Morbid obesity   . RBBB   . Bulging lumbar disc   . Bone spur     L4 L5  . Narrowing of lumbar spine   . Colon polyps     2009  . Right ear pain     s/p eval by ENT - thought TMJ referred pain and sent to oral surg for dental splint  . Diastolic CHF, chronic 29/62/2297    Review of Systems Per HPI    Objective:   Physical Exam  Nursing note and vitals reviewed. Constitutional: He appears well-developed and well-nourished. No distress.  HENT:   Head: Normocephalic and atraumatic.  Right Ear: Hearing, tympanic membrane, external ear and ear canal normal.  Left Ear: Hearing, tympanic membrane, external ear and ear canal normal.  Nose: Nose normal.  Mouth/Throat: Oropharynx is clear and moist. No oropharyngeal exudate.  scarring of TMs bilaterally but external canals clear  Eyes: Conjunctivae and EOM are normal. Pupils are equal, round, and reactive to light. No scleral icterus.  Neck: Normal range of motion. Neck supple.  Cardiovascular: Normal rate, regular rhythm, normal heart sounds and intact distal pulses.   No murmur heard. Frequent ectopy  Pulmonary/Chest: Effort normal and breath sounds normal. No respiratory distress. He has no wheezes. He has no rales.  Musculoskeletal: He exhibits no edema (minimal tr pedal edema).  Diabetic foot exam: Normal inspection No skin breakdown Slight calluses  Normal DP/PT pulses Normal sensation to light touch and monofilament Nails normal  Lymphadenopathy:    He has no cervical adenopathy.  Skin: Skin is warm and dry. No rash noted.  Psychiatric: He has a normal mood and affect.       Assessment & Plan:

## 2013-01-15 NOTE — Patient Instructions (Addendum)
Try different cough syrup for night time. Blood work today. Good to see you today, call us with questions. Return for wellness exam in 3 months, prior fasting for blood work.

## 2013-01-16 ENCOUNTER — Other Ambulatory Visit: Payer: Self-pay | Admitting: Family Medicine

## 2013-01-16 ENCOUNTER — Encounter: Payer: Self-pay | Admitting: Family Medicine

## 2013-01-16 ENCOUNTER — Ambulatory Visit (INDEPENDENT_AMBULATORY_CARE_PROVIDER_SITE_OTHER): Payer: Medicare Other | Admitting: Family Medicine

## 2013-01-16 VITALS — BP 138/78 | HR 76 | Temp 99.1°F | Wt 376.5 lb

## 2013-01-16 DIAGNOSIS — R079 Chest pain, unspecified: Secondary | ICD-10-CM

## 2013-01-16 LAB — CBC WITH DIFFERENTIAL/PLATELET
Basophils Absolute: 0 10*3/uL (ref 0.0–0.1)
Eosinophils Absolute: 0.1 10*3/uL (ref 0.0–0.7)
HCT: 37.4 % — ABNORMAL LOW (ref 39.0–52.0)
Lymphs Abs: 0.8 10*3/uL (ref 0.7–4.0)
MCV: 82.4 fl (ref 78.0–100.0)
Monocytes Absolute: 0.4 10*3/uL (ref 0.1–1.0)
Monocytes Relative: 7 % (ref 3.0–12.0)
Neutrophils Relative %: 76.7 % (ref 43.0–77.0)
Platelets: 132 10*3/uL — ABNORMAL LOW (ref 150.0–400.0)
RBC: 4.54 Mil/uL (ref 4.22–5.81)
RDW: 13.6 % (ref 11.5–14.6)
WBC: 5.3 10*3/uL (ref 4.5–10.5)

## 2013-01-16 LAB — TROPONIN I: Troponin I: <0.01 ng/mL (ref <0.06)

## 2013-01-16 LAB — HEPATIC FUNCTION PANEL
ALT: 51 U/L (ref 0–53)
AST: 69 U/L — ABNORMAL HIGH (ref 0–37)
Alkaline Phosphatase: 100 U/L (ref 39–117)
Total Bilirubin: 1 mg/dL (ref 0.3–1.2)
Total Protein: 6.6 g/dL (ref 6.0–8.3)

## 2013-01-16 MED ORDER — GI COCKTAIL ~~LOC~~
30.0000 mL | Freq: Once | ORAL | Status: AC
  Administered 2013-01-16: 30 mL via ORAL

## 2013-01-16 MED ORDER — KETOROLAC TROMETHAMINE 30 MG/ML IJ SOLN
30.0000 mg | Freq: Once | INTRAMUSCULAR | Status: AC
  Administered 2013-01-16: 30 mg via INTRAMUSCULAR

## 2013-01-16 MED ORDER — SITAGLIPTIN PHOSPHATE 50 MG PO TABS: 50.0000 mg | ORAL_TABLET | Freq: Every day | ORAL | Status: DC

## 2013-01-16 MED ORDER — SUCRALFATE 1 GM/10ML PO SUSP: 1.0000 g | Freq: Four times a day (QID) | ORAL | Status: DC

## 2013-01-16 NOTE — Progress Notes (Signed)
Pre-visit discussion using our clinic review tool. No additional management support is needed unless otherwise documented below in the visit note.  

## 2013-01-16 NOTE — Patient Instructions (Addendum)
I think this is inflammation of your lung lining or pleurisy after an infection.   Treat with shot of toradol today for possible pleurisy. This could also be worsening of heartburn so I recommend continued nexium and start carafate with each meal.  Stop naprosyn for 3 days to see if improvement in pain. We will check blood work today as well to check on heart. Please let me know if worsening - if worsening shortness of breath and chest pain, please seek urgent care.
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
## 2013-01-16 NOTE — Assessment & Plan Note (Addendum)
Atypical chest discomfort.  Doubt cardiac etiology but given comorbidites will check TnI. Anticipate pleurisy after recent infection vs worsening of GERD - will give GI cocktail today -> no significant improvement. Will give toradol 52m IM shot today for pleurisy -> sxs persist. I recommended discontinue naprosyn (he has been taking 5048mnightly for last 6 months per ortho recs) as this may have worsened GERD and precipitated dyspepsia - and recommended regular use of carafate TID AC.  If this doesn't help, rec restart naprosyn to treat possible pleurisy. If not better with above, I asked pt to update me for further evaluation.

## 2013-01-21 HISTORY — PX: CARDIAC CATHETERIZATION: SHX172

## 2013-01-22 ENCOUNTER — Other Ambulatory Visit: Payer: Self-pay | Admitting: Family Medicine

## 2013-01-22 NOTE — Telephone Encounter (Signed)
Ok to refill 

## 2013-01-23 ENCOUNTER — Other Ambulatory Visit: Payer: Self-pay

## 2013-01-23 MED ORDER — HYDROCODONE-ACETAMINOPHEN 10-325 MG PO TABS: ORAL_TABLET | ORAL | Status: DC

## 2013-01-23 NOTE — Telephone Encounter (Signed)
Patient picked up Rx. Given to [facility] for signature.

## 2013-01-23 NOTE — Telephone Encounter (Signed)
Chad [name] request rx hydrocodone apap. Call when ready for pickup.

## 2013-01-23 NOTE — Telephone Encounter (Signed)
Printed and placed in Kim's box.

## 2013-01-25 ENCOUNTER — Ambulatory Visit (INDEPENDENT_AMBULATORY_CARE_PROVIDER_SITE_OTHER)
Admission: RE | Admit: 2013-01-25 | Discharge: 2013-01-25 | Disposition: A | Discharge status: Home / Self Care | Payer: Medicare Other | Source: Ambulatory Visit | Attending: Family Medicine | Admitting: Family Medicine

## 2013-01-25 ENCOUNTER — Telehealth: Payer: Self-pay | Admitting: Family Medicine

## 2013-01-25 ENCOUNTER — Emergency Department (HOSPITAL_COMMUNITY): Payer: Medicare Other

## 2013-01-25 ENCOUNTER — Encounter (HOSPITAL_COMMUNITY): Payer: Self-pay | Admitting: Emergency Medicine

## 2013-01-25 ENCOUNTER — Encounter: Payer: Self-pay | Admitting: Family Medicine

## 2013-01-25 ENCOUNTER — Ambulatory Visit (INDEPENDENT_AMBULATORY_CARE_PROVIDER_SITE_OTHER): Payer: Medicare Other | Admitting: Family Medicine

## 2013-01-25 ENCOUNTER — Inpatient Hospital Stay (HOSPITAL_COMMUNITY)
Admission: EM | Admit: 2013-01-25 | Discharge: 2013-01-29 | DRG: 287 | Disposition: A | Discharge status: Home / Self Care | Payer: Medicare Other | Attending: Internal Medicine | Admitting: Internal Medicine

## 2013-01-25 VITALS — BP 124/78 | HR 71 | Temp 98.2°F | Wt 371.8 lb

## 2013-01-25 DIAGNOSIS — E11319 Type 2 diabetes mellitus with unspecified diabetic retinopathy without macular edema: Secondary | ICD-10-CM | POA: Diagnosis present

## 2013-01-25 DIAGNOSIS — R0600 Dyspnea, unspecified: Secondary | ICD-10-CM

## 2013-01-25 DIAGNOSIS — R079 Chest pain, unspecified: Secondary | ICD-10-CM

## 2013-01-25 DIAGNOSIS — I10 Essential (primary) hypertension: Secondary | ICD-10-CM | POA: Diagnosis present

## 2013-01-25 DIAGNOSIS — K5900 Constipation, unspecified: Secondary | ICD-10-CM | POA: Diagnosis present

## 2013-01-25 DIAGNOSIS — E785 Hyperlipidemia, unspecified: Secondary | ICD-10-CM | POA: Diagnosis not present

## 2013-01-25 DIAGNOSIS — E039 Hypothyroidism, unspecified: Secondary | ICD-10-CM

## 2013-01-25 DIAGNOSIS — Z7982 Long term (current) use of aspirin: Secondary | ICD-10-CM

## 2013-01-25 DIAGNOSIS — I452 Bifascicular block: Secondary | ICD-10-CM | POA: Diagnosis present

## 2013-01-25 DIAGNOSIS — M25561 Pain in right knee: Secondary | ICD-10-CM

## 2013-01-25 DIAGNOSIS — M129 Arthropathy, unspecified: Secondary | ICD-10-CM

## 2013-01-25 DIAGNOSIS — I2510 Atherosclerotic heart disease of native coronary artery without angina pectoris: Secondary | ICD-10-CM

## 2013-01-25 DIAGNOSIS — E119 Type 2 diabetes mellitus without complications: Secondary | ICD-10-CM

## 2013-01-25 DIAGNOSIS — I5032 Chronic diastolic (congestive) heart failure: Secondary | ICD-10-CM | POA: Diagnosis not present

## 2013-01-25 DIAGNOSIS — E1169 Type 2 diabetes mellitus with other specified complication: Secondary | ICD-10-CM | POA: Diagnosis present

## 2013-01-25 DIAGNOSIS — B369 Superficial mycosis, unspecified: Secondary | ICD-10-CM

## 2013-01-25 DIAGNOSIS — E669 Obesity, unspecified: Secondary | ICD-10-CM | POA: Diagnosis not present

## 2013-01-25 DIAGNOSIS — R0789 Other chest pain: Secondary | ICD-10-CM | POA: Diagnosis not present

## 2013-01-25 DIAGNOSIS — I498 Other specified cardiac arrhythmias: Secondary | ICD-10-CM

## 2013-01-25 DIAGNOSIS — I440 Atrioventricular block, first degree: Secondary | ICD-10-CM

## 2013-01-25 DIAGNOSIS — G4733 Obstructive sleep apnea (adult) (pediatric): Secondary | ICD-10-CM

## 2013-01-25 DIAGNOSIS — K219 Gastro-esophageal reflux disease without esophagitis: Secondary | ICD-10-CM

## 2013-01-25 DIAGNOSIS — M1990 Unspecified osteoarthritis, unspecified site: Secondary | ICD-10-CM

## 2013-01-25 DIAGNOSIS — Z0000 Encounter for general adult medical examination without abnormal findings: Secondary | ICD-10-CM

## 2013-01-25 DIAGNOSIS — H9190 Unspecified hearing loss, unspecified ear: Secondary | ICD-10-CM | POA: Diagnosis present

## 2013-01-25 DIAGNOSIS — J302 Other seasonal allergic rhinitis: Secondary | ICD-10-CM

## 2013-01-25 DIAGNOSIS — K635 Polyp of colon: Secondary | ICD-10-CM

## 2013-01-25 DIAGNOSIS — R600 Localized edema: Secondary | ICD-10-CM

## 2013-01-25 DIAGNOSIS — M25562 Pain in left knee: Secondary | ICD-10-CM

## 2013-01-25 DIAGNOSIS — I509 Heart failure, unspecified: Secondary | ICD-10-CM | POA: Diagnosis present

## 2013-01-25 DIAGNOSIS — Z9884 Bariatric surgery status: Secondary | ICD-10-CM | POA: Diagnosis not present

## 2013-01-25 DIAGNOSIS — Z6841 Body Mass Index (BMI) 40.0 and over, adult: Secondary | ICD-10-CM

## 2013-01-25 DIAGNOSIS — J019 Acute sinusitis, unspecified: Secondary | ICD-10-CM

## 2013-01-25 DIAGNOSIS — R001 Bradycardia, unspecified: Secondary | ICD-10-CM

## 2013-01-25 DIAGNOSIS — I499 Cardiac arrhythmia, unspecified: Secondary | ICD-10-CM

## 2013-01-25 DIAGNOSIS — Z79899 Other long term (current) drug therapy: Secondary | ICD-10-CM | POA: Diagnosis not present

## 2013-01-25 HISTORY — DX: Ventricular premature depolarization: I49.3

## 2013-01-25 HISTORY — DX: Trifascicular block: I45.3

## 2013-01-25 LAB — CREATININE, SERUM
GFR calc Af Amer: >90 mL/min (ref >90)
GFR calc non Af Amer: 79 mL/min — ABNORMAL LOW (ref >90)

## 2013-01-25 LAB — COMPREHENSIVE METABOLIC PANEL
AST: 59 U/L — ABNORMAL HIGH (ref 0–37)
CO2: 25 mEq/L (ref 19–32)
Calcium: 9.4 mg/dL (ref 8.4–10.5)
Creatinine, Ser: 1.01 mg/dL (ref 0.50–1.35)
GFR calc Af Amer: >90 mL/min (ref >90)
GFR calc non Af Amer: 81 mL/min — ABNORMAL LOW (ref >90)
Total Protein: 6.9 g/dL (ref 6.0–8.3)

## 2013-01-25 LAB — CBC
HCT: 38.4 % — ABNORMAL LOW (ref 39.0–52.0)
Hemoglobin: 13.8 g/dL (ref 13.0–17.0)
Hemoglobin: 12.7 g/dL — ABNORMAL LOW (ref 13.0–17.0)
MCH: 28 pg (ref 26.0–34.0)
MCV: 83.9 fL (ref 78.0–100.0)
Platelets: 182 10*3/uL (ref 150–400)
RBC: 4.92 MIL/uL (ref 4.22–5.81)
RBC: 4.57 MIL/uL (ref 4.22–5.81)
WBC: 6.3 10*3/uL (ref 4.0–10.5)

## 2013-01-25 LAB — GLUCOSE, CAPILLARY
Glucose-Capillary: 162 mg/dL — ABNORMAL HIGH (ref 70–99)
Glucose-Capillary: 200 mg/dL — ABNORMAL HIGH (ref 70–99)

## 2013-01-25 LAB — TROPONIN I: Troponin I: <0.3 ng/mL (ref <0.30)

## 2013-01-25 LAB — POCT I-STAT TROPONIN I: Troponin i, poc: 0 ng/mL (ref 0.00–0.08)

## 2013-01-25 MED ORDER — HYDROCODONE-ACETAMINOPHEN 10-325 MG PO TABS
1.0000 | ORAL_TABLET | Freq: Four times a day (QID) | ORAL | Status: DC | PRN
  Administered 2013-01-25 – 2013-01-27 (×5): 1 via ORAL
  Filled 2013-01-25 (×5): qty 1

## 2013-01-25 MED ORDER — ACETAMINOPHEN 325 MG PO TABS: 650.0000 mg | ORAL_TABLET | Freq: Four times a day (QID) | ORAL | Status: DC | PRN

## 2013-01-25 MED ORDER — METOPROLOL TARTRATE 12.5 MG HALF TABLET
12.5000 mg | ORAL_TABLET | Freq: Two times a day (BID) | ORAL | Status: DC
  Administered 2013-01-25 – 2013-01-29 (×7): 12.5 mg via ORAL
  Filled 2013-01-25 (×9): qty 1

## 2013-01-25 MED ORDER — INSULIN ASPART 100 UNIT/ML ~~LOC~~ SOLN
0.0000 [IU] | Freq: Every day | SUBCUTANEOUS | Status: DC
  Administered 2013-01-26 – 2013-01-28 (×3): 2 [IU] via SUBCUTANEOUS

## 2013-01-25 MED ORDER — SODIUM CHLORIDE 0.9 % IJ SOLN
3.0000 mL | Freq: Two times a day (BID) | INTRAMUSCULAR | Status: DC
  Administered 2013-01-25 – 2013-01-26 (×3): 3 mL via INTRAVENOUS

## 2013-01-25 MED ORDER — MORPHINE SULFATE 4 MG/ML IJ SOLN
4.0000 mg | Freq: Once | INTRAMUSCULAR | Status: AC
  Administered 2013-01-25: 4 mg via INTRAVENOUS
  Filled 2013-01-25: qty 1

## 2013-01-25 MED ORDER — LISINOPRIL 40 MG PO TABS
40.0000 mg | ORAL_TABLET | Freq: Every day | ORAL | Status: DC
  Administered 2013-01-26 – 2013-01-29 (×4): 40 mg via ORAL
  Filled 2013-01-25 (×4): qty 1

## 2013-01-25 MED ORDER — NITROGLYCERIN 2 % TD OINT
0.5000 [in_us] | TOPICAL_OINTMENT | Freq: Once | TRANSDERMAL | Status: AC
  Administered 2013-01-25: 0.5 [in_us] via TOPICAL
  Filled 2013-01-25: qty 1

## 2013-01-25 MED ORDER — METOCLOPRAMIDE HCL 10 MG PO TABS
10.0000 mg | ORAL_TABLET | Freq: Two times a day (BID) | ORAL | Status: DC | PRN
  Administered 2013-01-26: 10 mg via ORAL
  Filled 2013-01-25: qty 1

## 2013-01-25 MED ORDER — FUROSEMIDE 20 MG PO TABS
20.0000 mg | ORAL_TABLET | Freq: Every day | ORAL | Status: DC
  Administered 2013-01-26 – 2013-01-28 (×3): 20 mg via ORAL
  Filled 2013-01-25 (×3): qty 1

## 2013-01-25 MED ORDER — ACETAMINOPHEN 650 MG RE SUPP: 650.0000 mg | Freq: Four times a day (QID) | RECTAL | Status: DC | PRN

## 2013-01-25 MED ORDER — ONDANSETRON HCL 4 MG PO TABS: 4.0000 mg | ORAL_TABLET | Freq: Four times a day (QID) | ORAL | Status: DC | PRN

## 2013-01-25 MED ORDER — PANTOPRAZOLE SODIUM 40 MG PO TBEC
80.0000 mg | DELAYED_RELEASE_TABLET | Freq: Two times a day (BID) | ORAL | Status: DC
  Administered 2013-01-25 – 2013-01-29 (×8): 80 mg via ORAL
  Filled 2013-01-25 (×8): qty 2

## 2013-01-25 MED ORDER — HYDROCHLOROTHIAZIDE 25 MG PO TABS
25.0000 mg | ORAL_TABLET | Freq: Every day | ORAL | Status: DC
  Administered 2013-01-26 – 2013-01-29 (×4): 25 mg via ORAL
  Filled 2013-01-25 (×4): qty 1

## 2013-01-25 MED ORDER — MORPHINE SULFATE 2 MG/ML IJ SOLN
2.0000 mg | Freq: Once | INTRAMUSCULAR | Status: AC
  Administered 2013-01-25: 2 mg via INTRAVENOUS
  Filled 2013-01-25: qty 1

## 2013-01-25 MED ORDER — POLYETHYLENE GLYCOL 3350 17 G PO PACK
17.0000 g | PACK | Freq: Every day | ORAL | Status: DC
  Administered 2013-01-26 – 2013-01-29 (×3): 17 g via ORAL
  Filled 2013-01-25 (×5): qty 1

## 2013-01-25 MED ORDER — ENOXAPARIN SODIUM 40 MG/0.4ML ~~LOC~~ SOLN
40.0000 mg | SUBCUTANEOUS | Status: DC
  Administered 2013-01-25 – 2013-01-27 (×3): 40 mg via SUBCUTANEOUS
  Filled 2013-01-25 (×4): qty 0.4

## 2013-01-25 MED ORDER — NITROGLYCERIN 0.4 MG SL SUBL
0.4000 mg | SUBLINGUAL_TABLET | Freq: Once | SUBLINGUAL | Status: AC
  Administered 2013-01-25: 0.4 mg via SUBLINGUAL

## 2013-01-25 MED ORDER — GI COCKTAIL ~~LOC~~
30.0000 mL | Freq: Once | ORAL | Status: AC
  Administered 2013-01-25: 30 mL via ORAL
  Filled 2013-01-25: qty 30

## 2013-01-25 MED ORDER — SUCRALFATE 1 GM/10ML PO SUSP
1.0000 g | Freq: Three times a day (TID) | ORAL | Status: DC
  Administered 2013-01-25 – 2013-01-27 (×9): 1 g via ORAL
  Filled 2013-01-25 (×14): qty 10

## 2013-01-25 MED ORDER — INSULIN ASPART 100 UNIT/ML ~~LOC~~ SOLN
0.0000 [IU] | Freq: Three times a day (TID) | SUBCUTANEOUS | Status: DC
  Administered 2013-01-26: 5 [IU] via SUBCUTANEOUS
  Administered 2013-01-26 – 2013-01-27 (×3): 3 [IU] via SUBCUTANEOUS
  Administered 2013-01-27: 8 [IU] via SUBCUTANEOUS
  Administered 2013-01-27: 3 [IU] via SUBCUTANEOUS
  Administered 2013-01-28 (×2): 5 [IU] via SUBCUTANEOUS
  Administered 2013-01-28: 8 [IU] via SUBCUTANEOUS
  Administered 2013-01-29: 5 [IU] via SUBCUTANEOUS

## 2013-01-25 MED ORDER — OMEGA-3-ACID ETHYL ESTERS 1 G PO CAPS
2.0000 g | ORAL_CAPSULE | Freq: Two times a day (BID) | ORAL | Status: DC
  Administered 2013-01-26 – 2013-01-29 (×7): 2 g via ORAL
  Filled 2013-01-25 (×9): qty 2

## 2013-01-25 MED ORDER — LEVOTHYROXINE SODIUM 100 MCG PO TABS
100.0000 ug | ORAL_TABLET | Freq: Every day | ORAL | Status: DC
  Administered 2013-01-26 – 2013-01-29 (×4): 100 ug via ORAL
  Filled 2013-01-25 (×5): qty 1

## 2013-01-25 MED ORDER — POLYETHYLENE GLYCOL 3350 17 GM/SCOOP PO POWD
17.0000 g | Freq: Every day | ORAL | Status: DC
  Filled 2013-01-25: qty 255

## 2013-01-25 MED ORDER — MORPHINE SULFATE 2 MG/ML IJ SOLN: 2.0000 mg | INTRAMUSCULAR | Status: DC | PRN

## 2013-01-25 MED ORDER — ONDANSETRON HCL 4 MG/2ML IJ SOLN: 4.0000 mg | Freq: Four times a day (QID) | INTRAMUSCULAR | Status: DC | PRN

## 2013-01-25 MED ORDER — ALUM & MAG HYDROXIDE-SIMETH 200-200-20 MG/5ML PO SUSP
30.0000 mL | Freq: Four times a day (QID) | ORAL | Status: DC | PRN
  Administered 2013-01-26: 30 mL via ORAL
  Filled 2013-01-25: qty 30

## 2013-01-25 MED ORDER — CARVEDILOL 3.125 MG PO TABS
3.1250 mg | ORAL_TABLET | Freq: Two times a day (BID) | ORAL | Status: DC
  Administered 2013-01-26 – 2013-01-28 (×5): 3.125 mg via ORAL
  Filled 2013-01-25 (×8): qty 1

## 2013-01-25 MED ORDER — ONDANSETRON HCL 4 MG/2ML IJ SOLN
4.0000 mg | Freq: Once | INTRAMUSCULAR | Status: AC
  Administered 2013-01-25: 4 mg via INTRAVENOUS
  Filled 2013-01-25: qty 2

## 2013-01-25 MED ORDER — MECLIZINE HCL 25 MG PO TABS
25.0000 mg | ORAL_TABLET | Freq: Two times a day (BID) | ORAL | Status: DC | PRN
  Filled 2013-01-25: qty 1

## 2013-01-25 MED ORDER — ASPIRIN EC 325 MG PO TBEC
325.0000 mg | DELAYED_RELEASE_TABLET | Freq: Every day | ORAL | Status: DC
  Administered 2013-01-26 – 2013-01-29 (×3): 325 mg via ORAL
  Filled 2013-01-25 (×4): qty 1

## 2013-01-25 MED ORDER — ASPIRIN 81 MG PO CHEW
324.0000 mg | CHEWABLE_TABLET | Freq: Once | ORAL | Status: AC
  Administered 2013-01-25: 324 mg via ORAL
  Filled 2013-01-25: qty 4

## 2013-01-25 MED ORDER — NITROGLYCERIN 2 % TD OINT
1.0000 [in_us] | TOPICAL_OINTMENT | Freq: Four times a day (QID) | TRANSDERMAL | Status: AC
  Administered 2013-01-25 – 2013-01-27 (×8): 1 [in_us] via TOPICAL
  Filled 2013-01-25: qty 30

## 2013-01-25 MED ORDER — ONDANSETRON HCL 4 MG/2ML IJ SOLN: 4.0000 mg | Freq: Three times a day (TID) | INTRAMUSCULAR | Status: AC | PRN

## 2013-01-25 NOTE — ED Provider Notes (Signed)
Pt is here with chest pain which he states started approximately 2 weeks ago, has been somewhat intermittent, worse with exertion but also feels during on exertional activity in last. He appears to be sterile, bilateral lower chest, radiation to the left neck and left upper extremity. Associated nausea and decreased appetite. Prior cardiac catheterization from 2012 showed left anterior descending moderate calcifications, but no other significant disease. Troponin normal, EKG abnormal with bigeminy in a right bundle branch block with PVCs, not entirely changed from prior EKG, patient will need admission to the hospital due to underlying risk and ongoing chest pain.  Pt d/w hospitalist - they agree to admit for observation.  ASA given in ED  Medical screening examination/treatment/procedure(s) were conducted as a shared visit with non-physician practitioner(s) and myself.  I personally evaluated the patient during the encounter.  Clinical Impression: Chest Pain      Johnna Acosta, MD 01/25/13 [phone]

## 2013-01-25 NOTE — Addendum Note (Signed)
Addended by: Royann Shivers A on: 01/25/2013 02:59 PM   Modules accepted: Orders

## 2013-01-25 NOTE — Telephone Encounter (Signed)
Will see today.  
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
## 2013-01-25 NOTE — Telephone Encounter (Signed)
Wife called, Chad [name] cannot keep food down and is tasting blood when he coughs.  Still having pain in chest.  Dr. Danise Mina can see today 01/25/13 at 12:30 pm, however if pt cannot wait or worsens, Dr. Danise Mina recommends Patient to go to the ER.  Chad [name] understood. / lt

## 2013-01-25 NOTE — Progress Notes (Signed)
Pt arrived from ED to floor c/o chest pain 6/10 sharp and stabbing radiating down left arm. EKG obtained. Rogue Bussing NP notified. New orders received will continue to monitor.
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
## 2013-01-25 NOTE — Patient Instructions (Signed)
To ER today for further evaluation.

## 2013-01-25 NOTE — Assessment & Plan Note (Addendum)
Return of atypical chest pain, however given comorbidities and lack of improvement, and as pain currently present, will need further evaluation at the ER to rule out cardiac source. Anticipate GI source, but discussed my recommendation to go to ER today. Nitro in office today - mild improvement. Pt and wife agree.  EKG - RBBB, RAD, frequent PVCs, unchanged from prior CXR clear on my read.
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
## 2013-01-25 NOTE — Progress Notes (Signed)
Pre-visit discussion using our clinic review tool. No additional management support is needed unless otherwise documented below in the visit note.  

## 2013-01-25 NOTE — ED Notes (Signed)
Pt starting having chest pain and left arm pain about 3 weeks ago. Pt went to MD office and had a cold. Pt has still been having the chest pain so MD told him to come to ED. Pt states pain is in the middle of his chest and "stings" and across his chest into his shoulder. Pt is very SOB when he walks, nauseous, dizzy and lightheaded. Pt was given 1 nitro in the MD office today, pt reports pain eased off after the nitro.

## 2013-01-26 ENCOUNTER — Other Ambulatory Visit: Payer: Self-pay

## 2013-01-26 ENCOUNTER — Encounter (HOSPITAL_COMMUNITY): Payer: Self-pay | Admitting: Internal Medicine

## 2013-01-26 DIAGNOSIS — M129 Arthropathy, unspecified: Secondary | ICD-10-CM | POA: Diagnosis not present

## 2013-01-26 DIAGNOSIS — I498 Other specified cardiac arrhythmias: Secondary | ICD-10-CM | POA: Diagnosis not present

## 2013-01-26 DIAGNOSIS — R079 Chest pain, unspecified: Secondary | ICD-10-CM | POA: Diagnosis not present

## 2013-01-26 DIAGNOSIS — I440 Atrioventricular block, first degree: Secondary | ICD-10-CM | POA: Diagnosis not present

## 2013-01-26 LAB — CK TOTAL AND CKMB (NOT AT ARMC)
CK, MB: 1.7 ng/mL (ref 0.3–4.0)
Total CK: 73 U/L (ref 7–232)

## 2013-01-26 LAB — CBC
Platelets: 121 10*3/uL — ABNORMAL LOW (ref 150–400)
RBC: 4.23 MIL/uL (ref 4.22–5.81)
WBC: 5.4 10*3/uL (ref 4.0–10.5)

## 2013-01-26 LAB — BASIC METABOLIC PANEL
BUN: 18 mg/dL (ref 6–23)
CO2: 26 mEq/L (ref 19–32)
Calcium: 8.7 mg/dL (ref 8.4–10.5)
GFR calc Af Amer: 85 mL/min — ABNORMAL LOW (ref >90)
GFR calc non Af Amer: 74 mL/min — ABNORMAL LOW (ref >90)
Sodium: 133 mEq/L — ABNORMAL LOW (ref 135–145)

## 2013-01-26 LAB — TROPONIN I
Troponin I: <0.3 ng/mL (ref <0.30)
Troponin I: <0.3 ng/mL (ref <0.30)

## 2013-01-26 LAB — GLUCOSE, CAPILLARY
Glucose-Capillary: 245 mg/dL — ABNORMAL HIGH (ref 70–99)
Glucose-Capillary: 229 mg/dL — ABNORMAL HIGH (ref 70–99)

## 2013-01-26 LAB — HEMOGLOBIN A1C: Mean Plasma Glucose: 194 mg/dL — ABNORMAL HIGH (ref <117)

## 2013-01-26 MED ORDER — GI COCKTAIL ~~LOC~~: 30.0000 mL | Freq: Three times a day (TID) | ORAL | Status: DC | PRN

## 2013-01-26 MED ORDER — SIMETHICONE 80 MG PO CHEW
80.0000 mg | CHEWABLE_TABLET | Freq: Four times a day (QID) | ORAL | Status: DC | PRN
  Filled 2013-01-26: qty 1

## 2013-01-26 NOTE — Progress Notes (Signed)
Nutrition Brief Note  Patient identified on the Malnutrition Screening Tool (MST) Report  Wt Readings from Last 15 Encounters:  01/26/13 370 lb 12.8 oz (168.194 kg)  01/25/13 371 lb 12 oz (168.625 kg)  01/16/13 376 lb 8 oz (170.779 kg)  01/15/13 376 lb 8 oz (170.779 kg)  01/04/13 382 lb 4 oz (173.387 kg)  12/10/12 382 lb 8 oz (173.501 kg)  10/15/12 375 lb 4 oz (170.212 kg)  10/08/12 371 lb 4 oz (168.398 kg)  10/02/12 374 lb 8 oz (169.872 kg)  09/27/12 376 lb 4 oz (170.666 kg)  09/07/12 378 lb (171.46 kg)  08/27/12 380 lb (172.367 kg)  07/03/12 375 lb 12.8 oz (170.462 kg)  05/23/12 376 lb 4 oz (170.666 kg)  04/02/12 369 lb 12 oz (167.717 kg)    Body mass index is 54.73 kg/(m^2). Patient meets criteria for morbidly obese based on current BMI.   Current diet order is CHO Modified Medium. Labs and medications reviewed.   Pt reports 16 lbs recent wt loss on admission.  Per chart review, pt has had variable weights over the past year ranging from 370-385 lbs. Potential wt loss of 4% usual wt is not significant for time frame reported.  Pt has a meal ordered for lunch today. No nutrition interventions warranted at this time. If nutrition issues arise, please consult RD.   Brynda Greathouse, MS RD LDN Clinical Inpatient Dietitian Pager: [phone] Weekend/After hours pager: [phone]

## 2013-01-26 NOTE — Progress Notes (Signed)
Patient ID: Chad [name]  male  DEY:[id number]    DOB: 1955-10-23    DOA: 01/25/2013  PCP: Chad Bush, MD  Assessment/Plan: Principal Problem:   Chest pain: Atypical with a GI complement as well as typical features, at the time of my examination this morning patient was still having episodic burning sensation, initially stated that it improved with GI cocktail last night - I did discuss with gastroenterology, recommended to evaluate further with EGD however per cardiology it should be done after the cardiac cath. I have relayed the message to GI and will reschedule EGD on Monday afternoon.  - Continue aspirin, Coreg, Lasix, lisinopril, lovaza, nitroglycerin ointment  - Continue PPI, Carafate and GI cocktail   Active Problems:   Diabetes type 2, controlled -Continue sliding scale insulin     HTN (hypertension) - Stable    HLD (hyperlipidemia) - Continue lovaza    Diastolic CHF, chronic - Currently compensated and stable, continue Lasix   DVT Prophylaxis:  Code Status:  Disposition:Monday or Tuesday    Subjective: Still having episode chest pain, improving with GI cocktail   Objective: Weight change:   Intake/Output Summary (Last 24 hours) at 01/26/13 1428 Last data filed at 01/26/13 1300  Gross per 24 hour  Intake    366 ml  Output      0 ml  Net    366 ml   Blood pressure 99/60, pulse 74, temperature 98.6 F (37 C), temperature source Oral, resp. rate 16, height 5' 9"  (1.753 m), weight 168.194 kg (370 lb 12.8 oz), SpO2 94.00%.  Physical Exam: General: Alert and awake, oriented x3, not in any acute distress. CVS: S1-S2 clear, no murmur rubs or gallops Chest: clear to auscultation bilaterally, no wheezing, rales or rhonchi Abdomen:  obese soft nontender, nondistended, normal bowel sounds  Extremities: no cyanosis, clubbing or edema noted bilaterally   Lab Results: Basic Metabolic Panel:  Recent Labs Lab 01/25/13 1533 01/25/13 2025 01/26/13 0153  NA  135  --  133*  K 3.9  --  3.7  CL 96  --  96  CO2 25  --  26  GLUCOSE 136*  --  193*  BUN 15  --  18  CREATININE 1.01 1.03 1.09  CALCIUM 9.4  --  8.7   Liver Function Tests:  Recent Labs Lab 01/25/13 1533  AST 59*  ALT 47  ALKPHOS 129*  BILITOT 0.9  PROT 6.9  ALBUMIN 3.9   No results found for this basename: LIPASE, AMYLASE,  in the last 168 hours No results found for this basename: AMMONIA,  in the last 168 hours CBC:  Recent Labs Lab 01/25/13 2025 01/26/13 0153  WBC 6.3 5.4  HGB 12.7* 11.8*  HCT 38.4* 35.8*  MCV 84.0 84.6  PLT 155 121*   Cardiac Enzymes:  Recent Labs Lab 01/25/13 2025 01/26/13 0153 01/26/13 0835  CKTOTAL 73 66 73  CKMB 1.7 1.5 1.7  TROPONINI <0.30 <0.30 <0.30   BNP: No components found with this basename: POCBNP,  CBG:  Recent Labs Lab 01/25/13 1913 01/25/13 2201 01/26/13 0748 01/26/13 1204  GLUCAP 162* 200* 188* 245*     Micro Results: No results found for this or any previous visit (from the past 240 hour(s)).  Studies/Results: Dg Chest 2 View  01/25/2013   CLINICAL DATA:  Chest pain.  EXAM: CHEST  2 VIEW  COMPARISON:  Same day.  FINDINGS: Stable cardiomediastinal silhouette. No pneumothorax or pleural effusion is noted. Mild anterior osteophyte formation  is seen involving the lower thoracic spine. Old left rib fracture is again noted. No acute pulmonary disease is noted.  IMPRESSION: No acute cardiopulmonary abnormality seen.   Electronically Signed   By: Sabino Dick M.D.   On: 01/25/2013 16:41   Dg Chest 2 View  01/25/2013   CLINICAL DATA:  Chest pain  EXAM: CHEST  2 VIEW  COMPARISON:  05/03/2010  FINDINGS: Low lung volumes. The cardiac silhouette is mild moderately enlarged. No focal reason consolidation or focal infiltrates. Healed lateral rib fracture on the left.  IMPRESSION: Low lung volumes.  No active cardiopulmonary disease.   Electronically Signed   By: Margaree Mackintosh M.D.   On: 01/25/2013 14:10     Medications: Scheduled Meds: . aspirin EC  325 mg Oral Daily  . carvedilol  3.125 mg Oral BID WC  . enoxaparin (LOVENOX) injection  40 mg Subcutaneous Q24H  . furosemide  20 mg Oral Daily  . hydrochlorothiazide  25 mg Oral Daily  . insulin aspart  0-15 Units Subcutaneous TID WC  . insulin aspart  0-5 Units Subcutaneous QHS  . levothyroxine  100 mcg Oral QAC breakfast  . lisinopril  40 mg Oral Daily  . metoprolol tartrate  12.5 mg Oral BID  . nitroGLYCERIN  1 inch Topical Q6H  . omega-3 acid ethyl esters  2 g Oral BID  . pantoprazole  80 mg Oral BID  . polyethylene glycol  17 g Oral Daily  . sodium chloride  3 mL Intravenous Q12H  . sucralfate  1 g Oral TID AC & HS      LOS: 1 day   [name] M.D. [facility] 01/26/2013, 2:28 PM Pager: [phone]  If 7PM-7AM, please contact night-coverage [email or web address] Password [email or web address]
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
## 2013-01-26 NOTE — Progress Notes (Signed)
Patient Name: Chad [name]      SUBJECTIVE: 57 yo admitted with chest pain which is midsternal and radiating to  l arm,  Lasting days 24/7 .    Ez neg but felt not good candidate for noninvasive testing 2/2 habitus  For cath on Monday  Also noted to have bigeminy. RBBB infer axis  Chronic   EF echo 8/14 55-60%  Baseline bifascicular block  RF incl DM, HLD, HTN obesity s/p gastric bypass "which failed to hold"  He also has hx of chronic remote trach   Past Medical History  Diagnosis Date  . T2DM (type 2 diabetes mellitus) 1990s  . HTN (hypertension)   . HLD (hyperlipidemia)     statin caused leg cramps  . GERD (gastroesophageal reflux disease)     severe, h/o gastritis and GI bleed, per pt normal EGD at [facility] 2008  . Seasonal allergies   . Colon polyp 2008  . Hypothyroid   . Hearing impairment     hearing aides  . Gastric bypass status for obesity 1985  . Morbid obesity   . RBBB   . Bulging lumbar disc   . Bone spur     L4 L5  . Narrowing of lumbar spine   . Colon polyps     2009  . Right ear pain     s/p eval by ENT - thought TMJ referred pain and sent to oral surg for dental splint  . Diastolic CHF, chronic 5/92/9244  . Shortness of breath     "comes on at anytime lately" (01/25/2013)  . OSA (obstructive sleep apnea)     unable to use CPAP as of last try 2/2 h/o tracheostomy?  Marland Kitchen Arthritis     "both hips and knees; got shots in each hip in August" (01/25/2013)    Scheduled Meds:  Scheduled Meds: . aspirin EC  325 mg Oral Daily  . carvedilol  3.125 mg Oral BID WC  . enoxaparin (LOVENOX) injection  40 mg Subcutaneous Q24H  . furosemide  20 mg Oral Daily  . hydrochlorothiazide  25 mg Oral Daily  . insulin aspart  0-15 Units Subcutaneous TID WC  . insulin aspart  0-5 Units Subcutaneous QHS  . levothyroxine  100 mcg Oral QAC breakfast  . lisinopril  40 mg Oral Daily  . metoprolol tartrate  12.5 mg Oral BID  . nitroGLYCERIN  1 inch Topical Q6H  .  omega-3 acid ethyl esters  2 g Oral BID  . pantoprazole  80 mg Oral BID  . polyethylene glycol  17 g Oral Daily  . sodium chloride  3 mL Intravenous Q12H  . sucralfate  1 g Oral TID AC & HS   Continuous Infusions:   PHYSICAL EXAM Filed Vitals:   01/25/13 2145 01/26/13 0452 01/26/13 0548 01/26/13 1023  BP: 118/60  124/59 128/61  Pulse: 96  74 82  Temp:   97.8 F (36.6 C)   TempSrc:   Oral   Resp:   18   Height:      Weight:  370 lb 12.8 oz (168.194 kg)    SpO2:   98%     General appearance: alert and mild distress Lungs: clear to auscultation bilaterally Heart: regular rate and rhythm, S1, S2 normal, no murmur, click, rub or gallop Abdomen: protruberant Extremities: edema 2+ Skin: Skin color, texture, turgor normal. No rashes or lesions Neurologic: Alert and oriented X 3, normal strength and tone. Normal symmetric  reflexes. Normal coordination and gait  TELEMETRY: Reviewed telemetry pt in nsr:    Intake/Output Summary (Last 24 hours) at 01/26/13 1141 Last data filed at 01/26/13 1025  Gross per 24 hour  Intake      6 ml  Output      0 ml  Net      6 ml    LABS: Basic Metabolic Panel:  Recent Labs Lab 01/25/13 1533 01/25/13 2025 01/26/13 0153  NA 135  --  133*  K 3.9  --  3.7  CL 96  --  96  CO2 25  --  26  GLUCOSE 136*  --  193*  BUN 15  --  18  CREATININE 1.01 1.03 1.09  CALCIUM 9.4  --  8.7   Cardiac Enzymes:  Recent Labs  01/25/13 2025 01/26/13 0153 01/26/13 0835  CKTOTAL 73 66 73  CKMB 1.7 1.5 1.7  TROPONINI <0.30 <0.30 <0.30   CBC:  Recent Labs Lab 01/25/13 1533 01/25/13 2025 01/26/13 0153  WBC 6.7 6.3 5.4  HGB 13.8 12.7* 11.8*  HCT 41.3 38.4* 35.8*  MCV 83.9 84.0 84.6  PLT 182 155 121*   PROTIME: No results found for this basename: LABPROT, INR,  in the last 72 hours Liver Function Tests:  Recent Labs  01/25/13 1533  AST 59*  ALT 47  ALKPHOS 129*  BILITOT 0.9  PROT 6.9  ALBUMIN 3.9   No results found for this  basename: LIPASE, AMYLASE,  in the last 72 hours BNP: BNP (last 3 results)  Recent Labs  09/27/12 0850  PROBNP 7.0   D-Dimer: No results found for this basename: DDIMER,  in the last 72 hours Hemoglobin A1C:  Recent Labs  01/25/13 2025  HGBA1C 8.4*    ECG with chest pain>> no acute ST changes  ASSESSMENT AND PLAN:  Principal Problem:   Chest pain Active Problems:   Diabetes type 2, controlled   HTN (hypertension)   HLD (hyperlipidemia)   Obesity   Diastolic CHF, chronic  Atypical pain PN thought was to do cath first and EGD to follow given intermediate pretest probability i agree Complex ectopy apparently without hemodynamic consequeces Significant conduction disease   Signed, Virl Axe MD  01/26/2013

## 2013-01-26 NOTE — Progress Notes (Signed)
Utilization Review completed.  

## 2013-01-27 ENCOUNTER — Encounter (HOSPITAL_COMMUNITY)
Admission: EM | Disposition: A | Discharge status: Home / Self Care | Payer: Self-pay | Source: Home / Self Care | Attending: Internal Medicine

## 2013-01-27 DIAGNOSIS — I440 Atrioventricular block, first degree: Secondary | ICD-10-CM | POA: Diagnosis not present

## 2013-01-27 DIAGNOSIS — I498 Other specified cardiac arrhythmias: Secondary | ICD-10-CM | POA: Diagnosis not present

## 2013-01-27 DIAGNOSIS — E119 Type 2 diabetes mellitus without complications: Secondary | ICD-10-CM

## 2013-01-27 DIAGNOSIS — I2510 Atherosclerotic heart disease of native coronary artery without angina pectoris: Secondary | ICD-10-CM | POA: Diagnosis not present

## 2013-01-27 DIAGNOSIS — M129 Arthropathy, unspecified: Secondary | ICD-10-CM | POA: Diagnosis not present

## 2013-01-27 DIAGNOSIS — R079 Chest pain, unspecified: Secondary | ICD-10-CM | POA: Diagnosis not present

## 2013-01-27 LAB — GLUCOSE, CAPILLARY

## 2013-01-27 SURGERY — EGD (ESOPHAGOGASTRODUODENOSCOPY)
Anesthesia: Moderate Sedation

## 2013-01-27 MED ORDER — ASPIRIN 81 MG PO CHEW
81.0000 mg | CHEWABLE_TABLET | ORAL | Status: AC
  Administered 2013-01-28: 81 mg via ORAL
  Filled 2013-01-27: qty 1

## 2013-01-27 MED ORDER — SODIUM CHLORIDE 0.9 % IV SOLN
250.0000 mL | INTRAVENOUS | Status: DC | PRN
  Administered 2013-01-28: 250 mL via INTRAVENOUS

## 2013-01-27 MED ORDER — SODIUM CHLORIDE 0.9 % IJ SOLN: 3.0000 mL | INTRAMUSCULAR | Status: DC | PRN

## 2013-01-27 MED ORDER — NITROGLYCERIN 2 % TD OINT
1.0000 [in_us] | TOPICAL_OINTMENT | Freq: Four times a day (QID) | TRANSDERMAL | Status: DC
  Administered 2013-01-27 – 2013-01-28 (×3): 1 [in_us] via TOPICAL
  Filled 2013-01-27: qty 30

## 2013-01-27 MED ORDER — SODIUM CHLORIDE 0.9 % IJ SOLN
3.0000 mL | Freq: Two times a day (BID) | INTRAMUSCULAR | Status: DC
  Administered 2013-01-27 – 2013-01-28 (×2): 3 mL via INTRAVENOUS

## 2013-01-27 NOTE — Progress Notes (Signed)
Patient ID: Chad [name]  male  CNO:[id number]    DOB: Jun 27, 1955    DOA: 01/25/2013  PCP: [name], MD  Assessment/Plan: Principal Problem:   Chest pain: Atypical with a GI complement as well as typical features, feels much better today -  per cardiology EGD after the cardiac cath. Placed n.p.o. after midnight tomorrow - Continue aspirin, Coreg, Lasix, lisinopril, lovaza, nitroglycerin ointment  - Continue PPI, Carafate and GI cocktail   Active Problems:   Diabetes type 2, controlled -Continue sliding scale insulin     HTN (hypertension) - Stable    HLD (hyperlipidemia) - Continue lovaza    Diastolic CHF, chronic - Currently compensated and stable, continue Lasix   DVT Prophylaxis:  Code Status:  Disposition:Monday or Tuesday    Subjective: Feels a whole lot better today, denies any chest pain today  Objective: Weight change:   Intake/Output Summary (Last 24 hours) at 01/27/13 1141 Last data filed at 01/27/13 0800  Gross per 24 hour  Intake    960 ml  Output      0 ml  Net    960 ml   Blood pressure 110/52, pulse 65, temperature 98.3 F (36.8 C), temperature source Oral, resp. rate 20, height 5' 9"  (1.753 m), weight 168.194 kg (370 lb 12.8 oz), SpO2 99.00%.  Physical Exam: General: Alert and awake, oriented x3, NAD CVS: S1-S2 clear, no murmur rubs or gallops Chest: CTAB Abdomen:  obese soft, NT, ND, NBS Extremities: no c/c/e bilaterally   Lab Results: Basic Metabolic Panel:  Recent Labs Lab 01/25/13 1533 01/25/13 2025 01/26/13 0153  NA 135  --  133*  K 3.9  --  3.7  CL 96  --  96  CO2 25  --  26  GLUCOSE 136*  --  193*  BUN 15  --  18  CREATININE 1.01 1.03 1.09  CALCIUM 9.4  --  8.7   Liver Function Tests:  Recent Labs Lab 01/25/13 1533  AST 59*  ALT 47  ALKPHOS 129*  BILITOT 0.9  PROT 6.9  ALBUMIN 3.9   No results found for this basename: LIPASE, AMYLASE,  in the last 168 hours No results found for this basename: AMMONIA,   in the last 168 hours CBC:  Recent Labs Lab 01/25/13 2025 01/26/13 0153  WBC 6.3 5.4  HGB 12.7* 11.8*  HCT 38.4* 35.8*  MCV 84.0 84.6  PLT 155 121*   Cardiac Enzymes:  Recent Labs Lab 01/25/13 2025 01/26/13 0153 01/26/13 0835  CKTOTAL 73 66 73  CKMB 1.7 1.5 1.7  TROPONINI <0.30 <0.30 <0.30   BNP: No components found with this basename: POCBNP,  CBG:  Recent Labs Lab 01/26/13 1204 01/26/13 1625 01/26/13 1957 01/26/13 2254 01/27/13 0731  GLUCAP 245* 173* 229* 223* 181*     Micro Results: No results found for this or any previous visit (from the past 240 hour(s)).  Studies/Results: Dg Chest 2 View  01/25/2013   CLINICAL DATA:  Chest pain.  EXAM: CHEST  2 VIEW  COMPARISON:  Same day.  FINDINGS: Stable cardiomediastinal silhouette. No pneumothorax or pleural effusion is noted. Mild anterior osteophyte formation is seen involving the lower thoracic spine. Old left rib fracture is again noted. No acute pulmonary disease is noted.  IMPRESSION: No acute cardiopulmonary abnormality seen.   Electronically Signed   By: Sabino Dick M.D.   On: 01/25/2013 16:41   Dg Chest 2 View  01/25/2013   CLINICAL DATA:  Chest pain  EXAM:  CHEST  2 VIEW  COMPARISON:  05/03/2010  FINDINGS: Low lung volumes. The cardiac silhouette is mild moderately enlarged. No focal reason consolidation or focal infiltrates. Healed lateral rib fracture on the left.  IMPRESSION: Low lung volumes.  No active cardiopulmonary disease.   Electronically Signed   By: Margaree Mackintosh M.D.   On: 01/25/2013 14:10    Medications: Scheduled Meds: . aspirin EC  325 mg Oral Daily  . carvedilol  3.125 mg Oral BID WC  . enoxaparin (LOVENOX) injection  40 mg Subcutaneous Q24H  . furosemide  20 mg Oral Daily  . hydrochlorothiazide  25 mg Oral Daily  . insulin aspart  0-15 Units Subcutaneous TID WC  . insulin aspart  0-5 Units Subcutaneous QHS  . levothyroxine  100 mcg Oral QAC breakfast  . lisinopril  40 mg Oral Daily   . metoprolol tartrate  12.5 mg Oral BID  . nitroGLYCERIN  1 inch Topical Q6H  . omega-3 acid ethyl esters  2 g Oral BID  . pantoprazole  80 mg Oral BID  . polyethylene glycol  17 g Oral Daily  . sodium chloride  3 mL Intravenous Q12H  . sucralfate  1 g Oral TID AC & HS      LOS: 2 days   RAI,RIPUDEEP M.D. [facility] 01/27/2013, 11:41 AM Pager: [phone]  If 7PM-7AM, please contact night-coverage [email or web address] Password [email or web address]

## 2013-01-27 NOTE — Progress Notes (Signed)
   Subjective  No chest pain this am, " it  Is getting better"   Objective  Atypical CP for GI ethiology, asked to see before  cardiac cath but prefer to do the cardiology work up first since the chesptain is partially exertional and not associated with meals, and radiates to left arm. He is on PPI and Carafate. Vital signs in last 24 hours: Temp:  [97.9 F (36.6 C)-98.6 F (37 C)] 98.3 F (36.8 C) (12/07 0500) Pulse Rate:  [65-82] 65 (12/07 0500) Resp:  [16-20] 20 (12/07 0500) BP: (99-128)/(49-61) 110/52 mmHg (12/07 0500) SpO2:  [94 %-99 %] 99 % (12/07 0500) Last BM Date: 01/25/13 General:    white male in NAD Heart:  Regular rate and rhythm; no murmurs Lungs: Respirations even and unlabored, lungs CTA bilaterally Abdomen:  Soft, nontender and nondistended. Normal bowel sounds. Extremities:  Without edema. Neurologic:  Alert and oriented,  grossly normal neurologically. Psych:  Cooperative. Normal mood and affect.  Intake/Output from previous day: 12/06 0701 - 12/07 0700 In: 59 [P.O.:600; I.V.:3] Out: -  Intake/Output this shift: Total I/O In: 360 [P.O.:360] Out: -   Lab Results:  Recent Labs  01/25/13 1533 01/25/13 2025 01/26/13 0153  WBC 6.7 6.3 5.4  HGB 13.8 12.7* 11.8*  HCT 41.3 38.4* 35.8*  PLT 182 155 121*   BMET  Recent Labs  01/25/13 1533 01/25/13 2025 01/26/13 0153  NA 135  --  133*  K 3.9  --  3.7  CL 96  --  96  CO2 25  --  26  GLUCOSE 136*  --  193*  BUN 15  --  18  CREATININE 1.01 1.03 1.09  CALCIUM 9.4  --  8.7   LFT  Recent Labs  01/25/13 1533  PROT 6.9  ALBUMIN 3.9  AST 59*  ALT 47  ALKPHOS 129*  BILITOT 0.9   PT/INR No results found for this basename: LABPROT, INR,  in the last 72 hours  Studies/Results: Dg Chest 2 View  01/25/2013   CLINICAL DATA:  Chest pain.  EXAM: CHEST  2 VIEW  COMPARISON:  Same day.  FINDINGS: Stable cardiomediastinal silhouette. No pneumothorax or pleural effusion is noted. Mild anterior osteophyte  formation is seen involving the lower thoracic spine. Old left rib fracture is again noted. No acute pulmonary disease is noted.  IMPRESSION: No acute cardiopulmonary abnormality seen.   Electronically Signed   By: Sabino Dick M.D.   On: 01/25/2013 16:41   Dg Chest 2 View  01/25/2013   CLINICAL DATA:  Chest pain  EXAM: CHEST  2 VIEW  COMPARISON:  05/03/2010  FINDINGS: Low lung volumes. The cardiac silhouette is mild moderately enlarged. No focal reason consolidation or focal infiltrates. Healed lateral rib fracture on the left.  IMPRESSION: Low lung volumes.  No active cardiopulmonary disease.   Electronically Signed   By: Margaree Mackintosh M.D.   On: 01/25/2013 14:10     Assessment / Plan:   Atypical chest pain  Under evaluation, plan heart cath tomorrow, then consider EGD based on findings.  Abnormal LFT's ?? Fatty liver on CT scan from 2009, s/p cholecystectomy  Remote gastric stapling, without expected weight loss  Principal Problem:   Chest pain Active Problems:   Diabetes type 2, controlled   HTN (hypertension)   HLD (hyperlipidemia)   Obesity   Diastolic CHF, chronic     LOS: 2 days   Delfin Edis  01/27/2013, 9:03 AM

## 2013-01-27 NOTE — Progress Notes (Signed)
Pt sleeping in bed. Nurse woke patient to take evening meds. After taking meds with cold water patient began to complain of 8/10 chest pressure that radiated to arm. 1inch nitro paste applied and patient placed on 2L o2. EKG obtained and no acute changes noted. VSS and documented in doc flowsheet. After 5 minutes of deep breathing patient pressure and pain relieved to 3/10. [place] [facility] paged and made aware. Dorna Bloom, RN

## 2013-01-27 NOTE — Progress Notes (Signed)
Patient Name: Chad [name]    SUBJECTIVE: 57 yo admitted with chest pain which is midsternal and radiating to  l arm,   He had a severe episode of CP 2 weeks ago while walking to the mailbox - has not felt right since.  Has exertional fatigue, dyspnea and CP.    felt not good candidate for noninvasive testing 2/2 habitus.  For cath on Monday  Also noted to have bigeminy. RBBB infer axis  Chronic   EF echo 8/14 55-60%  Baseline bifascicular block  RF incl DM, HLD, HTN obesity s/p gastric bypass "which failed to hold"  Past Medical History  Diagnosis Date  . T2DM (type 2 diabetes mellitus) 1990s  . HTN (hypertension)   . HLD (hyperlipidemia)     statin caused leg cramps  . GERD (gastroesophageal reflux disease)     severe, h/o gastritis and GI bleed, per pt normal EGD at [facility] 2008  . Seasonal allergies   . Colon polyp 2008  . Hypothyroid   . Hearing impairment     hearing aides  . Gastric bypass status for obesity 1985  . Morbid obesity   . Bulging lumbar disc   . Bone spur     L4 L5  . Narrowing of lumbar spine   . Colon polyps     2009  . Right ear pain     s/p eval by ENT - thought TMJ referred pain and sent to oral surg for dental splint  . Diastolic CHF, chronic 1/88/4166  . Shortness of breath     "comes on at anytime lately" (01/25/2013)  . OSA (obstructive sleep apnea)     unable to use CPAP as of last try 2/2 h/o tracheostomy?  Marland Kitchen Arthritis     "both hips and knees; got shots in each hip in August" (01/25/2013)  . Trifascicular block  RBBB/LPFB/1AVB   . PVC (premature ventricular contraction)     RBBB Infer axix    Scheduled Meds:  Scheduled Meds: . aspirin EC  325 mg Oral Daily  . carvedilol  3.125 mg Oral BID WC  . enoxaparin (LOVENOX) injection  40 mg Subcutaneous Q24H  . furosemide  20 mg Oral Daily  . hydrochlorothiazide  25 mg Oral Daily  . insulin aspart  0-15 Units Subcutaneous TID WC  . insulin aspart  0-5 Units Subcutaneous QHS  .  levothyroxine  100 mcg Oral QAC breakfast  . lisinopril  40 mg Oral Daily  . metoprolol tartrate  12.5 mg Oral BID  . nitroGLYCERIN  1 inch Topical Q6H  . omega-3 acid ethyl esters  2 g Oral BID  . pantoprazole  80 mg Oral BID  . polyethylene glycol  17 g Oral Daily  . sodium chloride  3 mL Intravenous Q12H  . sucralfate  1 g Oral TID AC & HS   Continuous Infusions:   PHYSICAL EXAM Filed Vitals:   01/26/13 1400 01/26/13 1706 01/26/13 2100 01/27/13 0500  BP: 99/60 104/50 103/49 110/52  Pulse: 74 73 70 65  Temp: 98.6 F (37 C)  97.9 F (36.6 C) 98.3 F (36.8 C)  TempSrc: Oral     Resp: 16  20 20   Height:      Weight:      SpO2: 94%  99% 99%    General appearance: alert and mild distress Lungs: clear to auscultation bilaterally Heart: regular rate and rhythm, S1, S2 normal, no murmur, click, rub or gallop Abdomen: obese  Extremities:  edema 1+, right radial pulses are ok  Skin: Skin color, texture, turgor normal. No rashes or lesions Neurologic: Alert and oriented X 3, normal strength and tone. Normal symmetric reflexes. Normal coordination and gait  TELEMETRY: Reviewed telemetry pt in nsr:    Intake/Output Summary (Last 24 hours) at 01/27/13 1014 Last data filed at 01/27/13 0800  Gross per 24 hour  Intake    963 ml  Output      0 ml  Net    963 ml    LABS: Basic Metabolic Panel:  Recent Labs Lab 01/25/13 1533 01/25/13 2025 01/26/13 0153  NA 135  --  133*  K 3.9  --  3.7  CL 96  --  96  CO2 25  --  26  GLUCOSE 136*  --  193*  BUN 15  --  18  CREATININE 1.01 1.03 1.09  CALCIUM 9.4  --  8.7   Cardiac Enzymes:  Recent Labs  01/25/13 2025 01/26/13 0153 01/26/13 0835  CKTOTAL 73 66 73  CKMB 1.7 1.5 1.7  TROPONINI <0.30 <0.30 <0.30   CBC:  Recent Labs Lab 01/25/13 1533 01/25/13 2025 01/26/13 0153  WBC 6.7 6.3 5.4  HGB 13.8 12.7* 11.8*  HCT 41.3 38.4* 35.8*  MCV 83.9 84.0 84.6  PLT 182 155 121*   PROTIME: No results found for this  basename: LABPROT, INR,  in the last 72 hours Liver Function Tests:  Recent Labs  01/25/13 1533  AST 59*  ALT 47  ALKPHOS 129*  BILITOT 0.9  PROT 6.9  ALBUMIN 3.9   No results found for this basename: LIPASE, AMYLASE,  in the last 72 hours BNP: BNP (last 3 results)  Recent Labs  09/27/12 0850  PROBNP 7.0   D-Dimer: No results found for this basename: DDIMER,  in the last 72 hours Hemoglobin A1C:  Recent Labs  01/25/13 2025  HGBA1C 8.4*    ECG with chest pain>> no acute ST changes  ASSESSMENT AND PLAN:  Principal Problem:   Chest pain Active Problems:   Diabetes type 2, controlled   HTN (hypertension)   HLD (hyperlipidemia)   Obesity   Diastolic CHF, chronic  1. Chest discomfort:  His symptoms are worrisome.  History of moderate CAD by cath 2 years ago.   He had an episode of CP 2 weeks ago while walking to the mailbox and has not felt well since that time.  Has had continued CP and fatigue, / dyspnea with exertion.  For cath tomorrow via right radial artery.  Discussed risks, benefits, options,  He understands and agrees to proceed.   Thayer Headings, Brooke Bonito., MD, [facility] 01/27/2013, 10:18 AM Office - [phone] Pager [phone]

## 2013-01-28 ENCOUNTER — Encounter (HOSPITAL_COMMUNITY)
Admission: EM | Disposition: A | Discharge status: Home / Self Care | Payer: Self-pay | Source: Home / Self Care | Attending: Internal Medicine

## 2013-01-28 ENCOUNTER — Encounter (HOSPITAL_COMMUNITY): Payer: Self-pay | Admitting: Physician Assistant

## 2013-01-28 DIAGNOSIS — K219 Gastro-esophageal reflux disease without esophagitis: Secondary | ICD-10-CM | POA: Diagnosis not present

## 2013-01-28 DIAGNOSIS — E669 Obesity, unspecified: Secondary | ICD-10-CM

## 2013-01-28 DIAGNOSIS — I10 Essential (primary) hypertension: Secondary | ICD-10-CM

## 2013-01-28 DIAGNOSIS — I5032 Chronic diastolic (congestive) heart failure: Secondary | ICD-10-CM

## 2013-01-28 DIAGNOSIS — E785 Hyperlipidemia, unspecified: Secondary | ICD-10-CM

## 2013-01-28 DIAGNOSIS — I2510 Atherosclerotic heart disease of native coronary artery without angina pectoris: Secondary | ICD-10-CM

## 2013-01-28 DIAGNOSIS — R079 Chest pain, unspecified: Secondary | ICD-10-CM

## 2013-01-28 DIAGNOSIS — E119 Type 2 diabetes mellitus without complications: Secondary | ICD-10-CM

## 2013-01-28 HISTORY — PX: LEFT HEART CATHETERIZATION WITH CORONARY ANGIOGRAM: SHX5451

## 2013-01-28 LAB — BASIC METABOLIC PANEL
BUN: 20 mg/dL (ref 6–23)
Creatinine, Ser: 1.21 mg/dL (ref 0.50–1.35)
GFR calc Af Amer: 75 mL/min — ABNORMAL LOW (ref >90)
GFR calc non Af Amer: 65 mL/min — ABNORMAL LOW (ref >90)
Glucose, Bld: 254 mg/dL — ABNORMAL HIGH (ref 70–99)
Potassium: 4.4 mEq/L (ref 3.5–5.1)

## 2013-01-28 LAB — GLUCOSE, CAPILLARY
Glucose-Capillary: 215 mg/dL — ABNORMAL HIGH (ref 70–99)
Glucose-Capillary: 260 mg/dL — ABNORMAL HIGH (ref 70–99)

## 2013-01-28 LAB — PROTIME-INR: INR: 1.08 (ref 0.00–1.49)

## 2013-01-28 LAB — LIPID PANEL
Cholesterol: 166 mg/dL (ref 0–200)
LDL Cholesterol: 104 mg/dL — ABNORMAL HIGH (ref 0–99)
Total CHOL/HDL Ratio: 5 RATIO
Triglycerides: 144 mg/dL (ref <150)
VLDL: 29 mg/dL (ref 0–40)

## 2013-01-28 SURGERY — LEFT HEART CATHETERIZATION WITH CORONARY ANGIOGRAM
Anesthesia: LOCAL

## 2013-01-28 MED ORDER — SIMETHICONE 80 MG PO CHEW: 80.0000 mg | CHEWABLE_TABLET | Freq: Four times a day (QID) | ORAL | Status: DC | PRN

## 2013-01-28 MED ORDER — MIDAZOLAM HCL 2 MG/2ML IJ SOLN
INTRAMUSCULAR | Status: AC
  Filled 2013-01-28: qty 2

## 2013-01-28 MED ORDER — INSULIN DETEMIR 100 UNIT/ML ~~LOC~~ SOLN
15.0000 [IU] | Freq: Every day | SUBCUTANEOUS | Status: DC
  Filled 2013-01-28 (×2): qty 0.15

## 2013-01-28 MED ORDER — DSS 100 MG PO CAPS: 100.0000 mg | ORAL_CAPSULE | Freq: Every day | ORAL | Status: DC | PRN

## 2013-01-28 MED ORDER — HEPARIN SODIUM (PORCINE) 1000 UNIT/ML IJ SOLN
INTRAMUSCULAR | Status: AC
  Filled 2013-01-28: qty 1

## 2013-01-28 MED ORDER — ZOLPIDEM TARTRATE 5 MG PO TABS
5.0000 mg | ORAL_TABLET | Freq: Once | ORAL | Status: DC
  Filled 2013-01-28: qty 1

## 2013-01-28 MED ORDER — FENTANYL CITRATE 0.05 MG/ML IJ SOLN
INTRAMUSCULAR | Status: AC
  Filled 2013-01-28: qty 2

## 2013-01-28 MED ORDER — DOCUSATE SODIUM 100 MG PO CAPS
100.0000 mg | ORAL_CAPSULE | Freq: Every day | ORAL | Status: DC | PRN
  Filled 2013-01-28 (×2): qty 1

## 2013-01-28 MED ORDER — ISOSORBIDE MONONITRATE ER 30 MG PO TB24: 30.0000 mg | ORAL_TABLET | Freq: Every day | ORAL | Status: DC

## 2013-01-28 MED ORDER — LIDOCAINE HCL (PF) 1 % IJ SOLN
INTRAMUSCULAR | Status: AC
  Filled 2013-01-28: qty 30

## 2013-01-28 MED ORDER — NITROGLYCERIN 0.4 MG SL SUBL: 0.4000 mg | SUBLINGUAL_TABLET | SUBLINGUAL | Status: DC | PRN

## 2013-01-28 MED ORDER — SODIUM CHLORIDE 0.9 % IV SOLN
INTRAVENOUS | Status: AC
  Administered 2013-01-28: 14:00:00 via INTRAVENOUS

## 2013-01-28 MED ORDER — VERAPAMIL HCL 2.5 MG/ML IV SOLN
INTRAVENOUS | Status: AC
  Filled 2013-01-28: qty 2

## 2013-01-28 MED ORDER — HEPARIN (PORCINE) IN NACL 2-0.9 UNIT/ML-% IJ SOLN
INTRAMUSCULAR | Status: AC
  Filled 2013-01-28: qty 1000

## 2013-01-28 NOTE — CV Procedure (Signed)
      Cardiac Catheterization Operative Report  Chad [name] [id number] 12/8/20141:02 PM [name], MD  Procedure Performed:  1. Left Heart Catheterization 2. Selective Coronary Angiography 3. Left ventricular angiogram  Operator: Lauree Chandler, MD  Arterial access site:  Right radial artery.   Indication: 57 yo male with history of HTN, HLD, DM, obesity, mild CAD by cath 2012 admitted with chest pain concerning for unstable angina. Cardiac markers negative.                                  Procedure Details: The risks, benefits, complications, treatment options, and expected outcomes were discussed with the patient. The patient and/or family concurred with the proposed plan, giving informed consent. The patient was brought to the cath lab after IV hydration was begun and oral premedication was given. The patient was further sedated with Versed and Fentanyl. The right wrist was assessed with an Allens test which was positive. The right wrist was prepped and draped in a sterile fashion. 1% lidocaine was used for local anesthesia. Using the modified Seldinger access technique, a 5 French sheath was placed in the right radial artery. 3 mg Verapamil was given through the sheath. 6000 units IV heparin was given. Standard diagnostic catheters were used to perform selective coronary angiography. A pigtail catheter was used to perform a left ventricular angiogram. The sheath was removed from the right radial artery and a Terumo hemostasis band was applied at the arteriotomy site on the right wrist.   There were no immediate complications. The patient was taken to the recovery area in stable condition.   Hemodynamic Findings: Central aortic pressure: 96/50 Left ventricular pressure: 99/0/8  Angiographic Findings:  Left main: No obstructive disease.   Left Anterior Descending Artery: Large caliber vessel that courses to the apex. The mid vessel has several 30% stenoses. The  diagonal branch is moderate and has no obstructive disease.   Circumflex Artery: Large caliber vessel with two early obtuse marginal branches. No obstructive disease.   Right Coronary Artery: Large caliber dominant vessel with no obstructive disease.   Left Ventricular Angiogram: LVEF=65%.   Impression: 1. Mild non-obstructive CAD 2. Normal LV systolic function 3. Non-cardiac chest pain  Recommendations: Continue medical management of CAD with ASA and statin.        Complications:  None. The patient tolerated the procedure well.

## 2013-01-28 NOTE — Progress Notes (Signed)
Pt's cardiac cath does not show significant CAD. Pt on for EGD tomorrow at 9 AM.  Can eat today.  Notified pt's wife she will notify the pt (resting currently)  Azucena Freed   GI ATTENDING  Catheter results noted. Plan diagnostic EGD in a.m. to complete workup of chest pain  John N. Geri Seminole., M.D. [facility] Division of Gastroenterology

## 2013-01-28 NOTE — H&P (View-Only) (Signed)
Subjective: No CP  NO SOB Objective: Filed Vitals:   01/27/13 1351 01/27/13 1713 01/27/13 2100 01/28/13 0500  BP: 112/57 144/78 115/52 123/62  Pulse: 83 82 81 74  Temp: 98.7 F (37.1 C) 98.8 F (37.1 C) 98.6 F (37 C) 97.5 F (36.4 C)  TempSrc: Oral Oral    Resp: 18 20 18 18   Height:      Weight:      SpO2: 95% 100% 98% 99%   Weight change:   Intake/Output Summary (Last 24 hours) at 01/28/13 0733 Last data filed at 01/27/13 1800  Gross per 24 hour  Intake    960 ml  Output      0 ml  Net    960 ml    General: Alert, awake, oriented x3, in no acute distress Neck:  JVP is normal Heart: Regular rate and rhythm, without murmurs, rubs, gallops.  Lungs: Clear to auscultation.  No rales or wheezes. Exemities:  No edema.   Neuro: Grossly intact, nonfocal.  TEle:  SR Lab Results: Results for orders placed during the hospital encounter of 01/25/13 (from the past 24 hour(s))  GLUCOSE, CAPILLARY     Status: Abnormal   Collection Time    01/27/13 11:39 AM      Result Value Range   Glucose-Capillary 281 (*) 70 - 99 mg/dL   Comment 1 Notify RN    GLUCOSE, CAPILLARY     Status: Abnormal   Collection Time    01/27/13  4:40 PM      Result Value Range   Glucose-Capillary 185 (*) 70 - 99 mg/dL   Comment 1 Notify RN    GLUCOSE, CAPILLARY     Status: Abnormal   Collection Time    01/27/13  9:02 PM      Result Value Range   Glucose-Capillary 238 (*) 70 - 99 mg/dL  PROTIME-INR     Status: None   Collection Time    01/28/13  5:25 AM      Result Value Range   Prothrombin Time 13.8  11.6 - 15.2 seconds   INR 1.08  0.00 - 1.49    Studies/Results: @RISRSLT24 @  Medications: Reviewed   @PROBHOSP @  1.  CP  COncerning for unstable angina.  Plan for cath today.    2.  CAD  Moderate by cath in past.   2.  HTN  GOod control  3.  HCM  Will check lipids  Patient is sensitive to statins.  Could benefit for Zetiia.    4/  Morbid obesity  Needs to lose wt.    LOS: 3 days    Dorris Carnes 01/28/2013, 7:33 AM

## 2013-01-28 NOTE — Progress Notes (Signed)
Inpatient Diabetes Program Recommendations  AACE/ADA: New Consensus Statement on Inpatient Glycemic Control (2013)  Target Ranges:  Prepandial:   less than 140 mg/dL      Peak postprandial:   less than 180 mg/dL (1-2 hours)      Critically ill patients:  140 - 180 mg/dL     Results for Chad [name], [place] (MRN [id number]) as of 01/28/2013 09:54  Ref. Range 01/27/2013 07:31 01/27/2013 11:39 01/27/2013 16:40 01/27/2013 21:02  Glucose-Capillary Latest Range: 70-99 mg/dL 181 (H) 281 (H) 185 (H) 238 (H)    Results for Chad [name], KLOPF (MRN [id number]) as of 01/28/2013 09:54  Ref. Range 01/28/2013 07:28  Glucose-Capillary Latest Range: 70-99 mg/dL 230 (H)    **Per records, patient takes the following DM medications at home: Glipizide 10 mg bid Metformin 1000 mg AM/ 1500 mg PM Actos 15 mg daily Januvia 50 mg daily   **Has been having elevated fasting glucose levels for the past 2 days.   **MD- Please consider adding low dose basal insulin to in-hospital insulin regimen: Levemir 15 units QHS (~0.1 units/kg dosing)   Will follow. [name] RN, MSN, CDE Diabetes Coordinator Inpatient Diabetes Program Team Pager: [phone] (8a-10p)

## 2013-01-28 NOTE — Progress Notes (Signed)
          Daily Rounding Note  01/28/2013, 9:18 AM  LOS: 3 days   SUBJECTIVE:       No chest pain today.    OBJECTIVE:         Vital signs in last 24 hours:    Temp:  [97.5 F (36.4 C)-98.8 F (37.1 C)] 97.5 F (36.4 C) (12/08 0500) Pulse Rate:  [74-83] 78 (12/08 0814) Resp:  [18-20] 18 (12/08 0500) BP: (112-144)/(52-78) 129/58 mmHg (12/08 0814) SpO2:  [95 %-100 %] 99 % (12/08 0500) Last BM Date: 01/25/13 General: obese, looks well.  comfortable   Heart: RRR.  No MRG Chest: clear bil.  No cough or dyspnea Abdomen: obese, soft, NT, ND  Extremities: + pedl/LE edema bil.  Neuro/Psych:  Pleasant, slight speech impediment c/w long term HOH. Fully alert, no confusion.  Intake/Output from previous day: 12/07 0701 - 12/08 0700 In: 960 [P.O.:960] Out: -   Intake/Output this shift:    Lab Results:  Recent Labs  01/25/13 1533 01/25/13 2025 01/26/13 0153  WBC 6.7 6.3 5.4  HGB 13.8 12.7* 11.8*  HCT 41.3 38.4* 35.8*  PLT 182 155 121*   BMET  Recent Labs  01/25/13 1533 01/25/13 2025 01/26/13 0153 01/28/13 0819  NA 135  --  133* 132*  K 3.9  --  3.7 4.4  CL 96  --  96 95*  CO2 25  --  26 27  GLUCOSE 136*  --  193* 254*  BUN 15  --  18 20  CREATININE 1.01 1.03 1.09 1.21  CALCIUM 9.4  --  8.7 9.2   LFT  Recent Labs  01/25/13 1533  PROT 6.9  ALBUMIN 3.9  AST 59*  ALT 47  ALKPHOS 129*  BILITOT 0.9   PT/INR  Recent Labs  01/28/13 0525  LABPROT 13.8  INR 1.08     ASSESMENT:   *  Chest pain.  Exertional c/w angina. Hx moderate CAD on 04/2010 catheterization   *  Morbid Obesity.  BMI 55, 371 #. 1985 gastric stapling "which failed to hold"   *  OSA, not able to tolerate CPAP.   *  GERD, normal EGD 2008 at [facility]. PTA on BID Nexium, QID Carafate (started 06/2012 by Dr Hilarie Fredrickson but has not helped his heartburn), PRN Reglan  *  Osteoarthritis, knee pain.  Takes Naproxen nightly, takes 1 to 3 hydrocodone 10  mg daily.   *  NIDDM.   *  HP colon polyp in 2008.  Due repeat, average risk, screening in Sept 2018.  PLAN   *  Cardiac cath today.   *  Stop Carafate, it has proven ineffective.   *  Please reconsult GI if EGD still felt to be indicated.  Also I removed from record the hx of esophagectomy.  On detailed questioning the surgery he had was tracheal in nature.     Azucena Freed  01/28/2013, 9:18 AM Pager: [phone]  GI ATTENDING  Case discussed with GI colleagues in morning report. Pain more consistent with cardiac, than GI. Agree with above as outlined. We are available if needed.  Docia Chuck. Geri Seminole., M.D. [facility] Division of Gastroenterology

## 2013-01-28 NOTE — Progress Notes (Signed)
TR band removed, pt with positive allen's test; pressure dressing applied

## 2013-01-28 NOTE — Progress Notes (Addendum)
Patient ID: Chad [name]  male  WGN:[id number]    DOB: 03-27-1955    DOA: 01/25/2013  PCP: [name], MD  Addendum  Cardiac cath done today  Left Ventricular Angiogram: LVEF=65%.  Impression:  1. Mild non-obstructive CAD  2. Normal LV systolic function  3. Non-cardiac chest pain  Recommendations: Continue medical management of CAD with ASA and statin.   Discussed with gastroenterology, plan to do EGD tomorrow.   Marquesa Rath M.D. [facility] 01/28/2013, 3:08 PM  Pager: [phone]      Assessment/Plan: Principal Problem:   Chest pain: Atypical with a GI features as well as typical cardiac features, no current CP - NPO for cardiac cath today - Continue aspirin, Coreg, Lasix, lisinopril, lovaza, nitroglycerin ointment  - Continue PPI - GI recommendations reviewed, to re-consult if EGD needed.   Active Problems:   Diabetes type 2, controlled -Continue sliding scale insulin  - Added Levemir while inpatient    HTN (hypertension) - Stable    HLD (hyperlipidemia) - Continue lovaza    Diastolic CHF, chronic - Currently compensated and stable, continue Lasix   DVT Prophylaxis:  Code Status:  Disposition: May be able to DC today if cleared by cardiology and f/u appt with GI with Dr Hilarie Fredrickson made on 02/05/13    Subjective: Feels a whole lot better today, denies any chest pain, awaiting cardiac cath today  Objective: Weight change:   Intake/Output Summary (Last 24 hours) at 01/28/13 1147 Last data filed at 01/27/13 1800  Gross per 24 hour  Intake    600 ml  Output      0 ml  Net    600 ml   Blood pressure 115/95, pulse 76, temperature 97.5 F (36.4 C), temperature source Oral, resp. rate 18, height 5' 9"  (1.753 m), weight 168.194 kg (370 lb 12.8 oz), SpO2 99.00%.  Physical Exam: General: Alert and awake, oriented x3, NAD CVS: S1-S2 clear, no murmur rubs or gallops Chest: CTAB Abdomen:  obese soft, NT, ND, NBS Extremities: no c/c/e  bilaterally   Lab Results: Basic Metabolic Panel:  Recent Labs Lab 01/26/13 0153 01/28/13 0819  NA 133* 132*  K 3.7 4.4  CL 96 95*  CO2 26 27  GLUCOSE 193* 254*  BUN 18 20  CREATININE 1.09 1.21  CALCIUM 8.7 9.2   Liver Function Tests:  Recent Labs Lab 01/25/13 1533  AST 59*  ALT 47  ALKPHOS 129*  BILITOT 0.9  PROT 6.9  ALBUMIN 3.9   No results found for this basename: LIPASE, AMYLASE,  in the last 168 hours No results found for this basename: AMMONIA,  in the last 168 hours CBC:  Recent Labs Lab 01/25/13 2025 01/26/13 0153  WBC 6.3 5.4  HGB 12.7* 11.8*  HCT 38.4* 35.8*  MCV 84.0 84.6  PLT 155 121*   Cardiac Enzymes:  Recent Labs Lab 01/25/13 2025 01/26/13 0153 01/26/13 0835  CKTOTAL 73 66 73  CKMB 1.7 1.5 1.7  TROPONINI <0.30 <0.30 <0.30   BNP: No components found with this basename: POCBNP,  CBG:  Recent Labs Lab 01/27/13 0731 01/27/13 1139 01/27/13 1640 01/27/13 2102 01/28/13 0728  GLUCAP 181* 281* 185* 238* 230*     Micro Results: No results found for this or any previous visit (from the past 240 hour(s)).  Studies/Results: Dg Chest 2 View  01/25/2013   CLINICAL DATA:  Chest pain.  EXAM: CHEST  2 VIEW  COMPARISON:  Same day.  FINDINGS: Stable cardiomediastinal silhouette. No pneumothorax or pleural  effusion is noted. Mild anterior osteophyte formation is seen involving the lower thoracic spine. Old left rib fracture is again noted. No acute pulmonary disease is noted.  IMPRESSION: No acute cardiopulmonary abnormality seen.   Electronically Signed   By: Sabino Dick M.D.   On: 01/25/2013 16:41   Dg Chest 2 View  01/25/2013   CLINICAL DATA:  Chest pain  EXAM: CHEST  2 VIEW  COMPARISON:  05/03/2010  FINDINGS: Low lung volumes. The cardiac silhouette is mild moderately enlarged. No focal reason consolidation or focal infiltrates. Healed lateral rib fracture on the left.  IMPRESSION: Low lung volumes.  No active cardiopulmonary disease.    Electronically Signed   By: Margaree Mackintosh M.D.   On: 01/25/2013 14:10    Medications: Scheduled Meds: . aspirin EC  325 mg Oral Daily  . carvedilol  3.125 mg Oral BID WC  . enoxaparin (LOVENOX) injection  40 mg Subcutaneous Q24H  . furosemide  20 mg Oral Daily  . hydrochlorothiazide  25 mg Oral Daily  . insulin aspart  0-15 Units Subcutaneous TID WC  . insulin aspart  0-5 Units Subcutaneous QHS  . insulin detemir  15 Units Subcutaneous QHS  . levothyroxine  100 mcg Oral QAC breakfast  . lisinopril  40 mg Oral Daily  . metoprolol tartrate  12.5 mg Oral BID  . nitroGLYCERIN  1 inch Topical Q6H  . omega-3 acid ethyl esters  2 g Oral BID  . pantoprazole  80 mg Oral BID  . polyethylene glycol  17 g Oral Daily  . sodium chloride  3 mL Intravenous Q12H  . sodium chloride  3 mL Intravenous Q12H      LOS: 3 days   Shamra Bradeen M.D. [facility] 01/28/2013, 11:47 AM Pager: [phone]  If 7PM-7AM, please contact night-coverage [email or web address] Password [email or web address]

## 2013-01-28 NOTE — Progress Notes (Signed)
Subjective: No CP  NO SOB Objective: Filed Vitals:   01/27/13 1351 01/27/13 1713 01/27/13 2100 01/28/13 0500  BP: 112/57 144/78 115/52 123/62  Pulse: 83 82 81 74  Temp: 98.7 F (37.1 C) 98.8 F (37.1 C) 98.6 F (37 C) 97.5 F (36.4 C)  TempSrc: Oral Oral    Resp: 18 20 18 18   Height:      Weight:      SpO2: 95% 100% 98% 99%   Weight change:   Intake/Output Summary (Last 24 hours) at 01/28/13 0733 Last data filed at 01/27/13 1800  Gross per 24 hour  Intake    960 ml  Output      0 ml  Net    960 ml    General: Alert, awake, oriented x3, in no acute distress Neck:  JVP is normal Heart: Regular rate and rhythm, without murmurs, rubs, gallops.  Lungs: Clear to auscultation.  No rales or wheezes. Exemities:  No edema.   Neuro: Grossly intact, nonfocal.  TEle:  SR Lab Results: Results for orders placed during the hospital encounter of 01/25/13 (from the past 24 hour(s))  GLUCOSE, CAPILLARY     Status: Abnormal   Collection Time    01/27/13 11:39 AM      Result Value Range   Glucose-Capillary 281 (*) 70 - 99 mg/dL   Comment 1 Notify RN    GLUCOSE, CAPILLARY     Status: Abnormal   Collection Time    01/27/13  4:40 PM      Result Value Range   Glucose-Capillary 185 (*) 70 - 99 mg/dL   Comment 1 Notify RN    GLUCOSE, CAPILLARY     Status: Abnormal   Collection Time    01/27/13  9:02 PM      Result Value Range   Glucose-Capillary 238 (*) 70 - 99 mg/dL  PROTIME-INR     Status: None   Collection Time    01/28/13  5:25 AM      Result Value Range   Prothrombin Time 13.8  11.6 - 15.2 seconds   INR 1.08  0.00 - 1.49    Studies/Results: @RISRSLT24 @  Medications: Reviewed   @PROBHOSP @  1.  CP  COncerning for unstable angina.  Plan for cath today.    2.  CAD  Moderate by cath in past.   2.  HTN  GOod control  3.  HCM  Will check lipids  Patient is sensitive to statins.  Could benefit for Zetiia.    4/  Morbid obesity  Needs to lose wt.    LOS: 3 days    Dorris Carnes 01/28/2013, 7:33 AM

## 2013-01-28 NOTE — Interval H&P Note (Signed)
History and Physical Interval Note:  01/28/2013 12:20 PM  Chad [name]  has presented today for cardiac cath with the diagnosis of chest pain.  The various methods of treatment have been discussed with the patient and family. After consideration of risks, benefits and other options for treatment, the patient has consented to  Procedure(s): LEFT HEART CATHETERIZATION WITH CORONARY ANGIOGRAM (N/A) as a surgical intervention .  The patient's history has been reviewed, patient examined, no change in status, stable for surgery.  I have reviewed the patient's chart and labs.  Questions were answered to the patient's satisfaction.    Cath Lab Visit (complete for each Cath Lab visit)  Clinical Evaluation Leading to the Procedure:   ACS: no  Non-ACS:    Anginal Classification: CCS III  Anti-ischemic medical therapy: Minimal Therapy (1 class of medications)  Non-Invasive Test Results: No non-invasive testing performed  Prior CABG: No previous CABG         Ahmadou Bolz

## 2013-01-29 ENCOUNTER — Encounter (HOSPITAL_COMMUNITY)
Admission: EM | Disposition: A | Discharge status: Home / Self Care | Payer: Self-pay | Source: Home / Self Care | Attending: Internal Medicine

## 2013-01-29 ENCOUNTER — Encounter (HOSPITAL_COMMUNITY): Payer: Self-pay

## 2013-01-29 DIAGNOSIS — E119 Type 2 diabetes mellitus without complications: Secondary | ICD-10-CM | POA: Diagnosis not present

## 2013-01-29 DIAGNOSIS — R079 Chest pain, unspecified: Secondary | ICD-10-CM | POA: Diagnosis not present

## 2013-01-29 DIAGNOSIS — I498 Other specified cardiac arrhythmias: Secondary | ICD-10-CM | POA: Diagnosis not present

## 2013-01-29 DIAGNOSIS — I5032 Chronic diastolic (congestive) heart failure: Secondary | ICD-10-CM

## 2013-01-29 DIAGNOSIS — K219 Gastro-esophageal reflux disease without esophagitis: Secondary | ICD-10-CM | POA: Diagnosis not present

## 2013-01-29 DIAGNOSIS — I509 Heart failure, unspecified: Secondary | ICD-10-CM

## 2013-01-29 DIAGNOSIS — I2510 Atherosclerotic heart disease of native coronary artery without angina pectoris: Secondary | ICD-10-CM | POA: Diagnosis not present

## 2013-01-29 HISTORY — PX: ESOPHAGOGASTRODUODENOSCOPY: SHX5428

## 2013-01-29 LAB — GLUCOSE, CAPILLARY: Glucose-Capillary: 205 mg/dL — ABNORMAL HIGH (ref 70–99)

## 2013-01-29 SURGERY — EGD (ESOPHAGOGASTRODUODENOSCOPY)
Anesthesia: Moderate Sedation

## 2013-01-29 MED ORDER — MIDAZOLAM HCL 10 MG/2ML IJ SOLN
INTRAMUSCULAR | Status: DC | PRN
  Administered 2013-01-29 (×4): 2 mg via INTRAVENOUS

## 2013-01-29 MED ORDER — FENTANYL CITRATE 0.05 MG/ML IJ SOLN
INTRAMUSCULAR | Status: AC
  Filled 2013-01-29: qty 2

## 2013-01-29 MED ORDER — BUTAMBEN-TETRACAINE-BENZOCAINE 2-2-14 % EX AERO
INHALATION_SPRAY | CUTANEOUS | Status: DC | PRN
  Administered 2013-01-29: 2 via TOPICAL

## 2013-01-29 MED ORDER — SODIUM CHLORIDE 0.9 % IV SOLN
INTRAVENOUS | Status: DC
  Administered 2013-01-29: 08:00:00 via INTRAVENOUS

## 2013-01-29 MED ORDER — MIDAZOLAM HCL 5 MG/ML IJ SOLN
INTRAMUSCULAR | Status: AC
  Filled 2013-01-29: qty 2

## 2013-01-29 MED ORDER — TRAMADOL HCL 50 MG PO TABS: 50.0000 mg | ORAL_TABLET | Freq: Two times a day (BID) | ORAL | Status: DC | PRN

## 2013-01-29 MED ORDER — NITROGLYCERIN 0.4 MG SL SUBL: 0.4000 mg | SUBLINGUAL_TABLET | SUBLINGUAL | Status: DC | PRN

## 2013-01-29 MED ORDER — FENTANYL CITRATE 0.05 MG/ML IJ SOLN
INTRAMUSCULAR | Status: DC | PRN
  Administered 2013-01-29 (×4): 25 ug via INTRAVENOUS

## 2013-01-29 NOTE — Op Note (Signed)
[facility] [place], [id number]   ENDOSCOPY PROCEDURE REPORT  PATIENT: Chad, [name]  MR#: [id number] BIRTHDATE: [birth year] , 57  yrs. old GENDER: Male ENDOSCOPIST: Eustace Quail, MD REFERRED BY:  .  Hospital lists, [facility] PROCEDURE DATE:  01/29/2013 PROCEDURE:  EGD, diagnostic ASA CLASS:     Class III INDICATIONS:  Chest pain.   History of esophageal reflux. MEDICATIONS: Fentanyl 75 mcg IV and Versed 7 mg IV TOPICAL ANESTHETIC: Cetacaine Spray  DESCRIPTION OF PROCEDURE: After the risks benefits and alternatives of the procedure were thoroughly explained, informed consent was obtained.  The Pentax Gastroscope [id number] endoscope was introduced through the mouth and advanced to the second portion of the duodenum. Without limitations.  The instrument was slowly withdrawn as the mucosa was fully examined.      Normal esophagus.  Prior bariatric surgery (looks like sleeve gastrectomy).  Some retained gastric contents.  Otherwise normal exam.  Retroflexed views revealed no abnormalities.     The scope was then withdrawn from the patient and the procedure completed.  COMPLICATIONS: There were no complications. ENDOSCOPIC IMPRESSION: 1. Normal esophagus.  Prior bariatric surgery (looks like sleeve gastrectomy).  Some retained gastric contents.  Otherwise normal exam.  NO cause for chest pain found  RECOMMENDATIONS: 1. Continue current meds for GERD 2. GI follw up prn  REPEAT EXAM:  eSigned:  Eustace Quail, MD 01/29/2013 9:51 AM   CC:The Patient, Zenovia Jarred MD, and [name] MD  ;

## 2013-01-29 NOTE — H&P (View-Only) (Signed)
Pt's cardiac cath does not show significant CAD. Pt on for EGD tomorrow at 9 AM.  Can eat today.  Notified pt's wife she will notify the pt (resting currently)  Chad [name]   GI ATTENDING  Catheter results noted. Plan diagnostic EGD in a.m. to complete workup of chest pain  Daveena Elmore N. Geri Seminole., M.D. [facility] Division of Gastroenterology

## 2013-01-29 NOTE — Progress Notes (Signed)
     SUBJECTIVE: No chest pain this am. No SOB.   BP 132/64  Pulse 84  Temp(Src) 98.2 F (36.8 C) (Oral)  Resp 18  Ht 5' 9"  (1.753 m)  Wt 370 lb 12.8 oz (168.194 kg)  BMI 54.73 kg/m2  SpO2 95% No intake or output data in the 24 hours ending 01/29/13 0657  PHYSICAL EXAM General: Well developed, well nourished, in no acute distress. Alert and oriented x 3.  Psych:  Good affect, responds appropriately Neck: No JVD. No masses noted.  Lungs: Clear bilaterally with no wheezes or rhonci noted.  Heart: RRR with no murmurs noted. Abdomen: Bowel sounds are present. Soft, non-tender.  Extremities: No lower extremity edema. Right wrist cath site is stable.   LABS: Basic Metabolic Panel:  Recent Labs  01/28/13 0819  NA 132*  K 4.4  CL 95*  CO2 27  GLUCOSE 254*  BUN 20  CREATININE 1.21  CALCIUM 9.2   Cardiac Enzymes:  Recent Labs  01/26/13 0835  CKTOTAL 73  CKMB 1.7  TROPONINI <0.30   Fasting Lipid Panel:  Recent Labs  01/28/13 0819  CHOL 166  HDL 33*  LDLCALC 104*  TRIG 144  CHOLHDL 5.0    Current Meds: . aspirin EC  325 mg Oral Daily  . hydrochlorothiazide  25 mg Oral Daily  . insulin aspart  0-15 Units Subcutaneous TID WC  . insulin aspart  0-5 Units Subcutaneous QHS  . insulin detemir  15 Units Subcutaneous QHS  . levothyroxine  100 mcg Oral QAC breakfast  . lisinopril  40 mg Oral Daily  . metoprolol tartrate  12.5 mg Oral BID  . omega-3 acid ethyl esters  2 g Oral BID  . pantoprazole  80 mg Oral BID  . polyethylene glycol  17 g Oral Daily  . sodium chloride  3 mL Intravenous Q12H  . zolpidem  5 mg Oral Once   Cardiac cath 01/28/13:  Angiographic Findings:  Left main: No obstructive disease.  Left Anterior Descending Artery: Large caliber vessel that courses to the apex. The mid vessel has several 30% stenoses. The diagonal branch is moderate and has no obstructive disease.  Circumflex Artery: Large caliber vessel with two early obtuse marginal  branches. No obstructive disease.  Right Coronary Artery: Large caliber dominant vessel with no obstructive disease.  Left Ventricular Angiogram: LVEF=65%.  Impression:  1. Mild non-obstructive CAD  2. Normal LV systolic function  3. Non-cardiac chest pain  ASSESSMENT AND PLAN:  1. Chest pain: Does not appear to be cardiac related. Cardiac cath 01/28/13 with mild non-obstructive disease. No further cardiac workup at this time. Plans for EGD today.   2. CAD: Would recommend continued ASA therapy. He is intolerant of statins. He can keep his planned f/u with Dr. Esmond Plants in the [facility] office.   Will sign off. Please call with questions.    Laurella Tull  12/9/20146:57 AM

## 2013-01-29 NOTE — Progress Notes (Signed)
D/c orders received;IV removed with gauze on, pt remains in stable condition, pt meds and instructions reviewed and given to pt; pt d/c to home 

## 2013-01-29 NOTE — Interval H&P Note (Signed)
History and Physical Interval Note:  01/29/2013 9:18 AM  Chad [name]  has presented today for surgery, with the diagnosis of chest pain.  The various methods of treatment have been discussed with the patient and family. After consideration of risks, benefits and other options for treatment, the patient has consented to  Procedure(s): ESOPHAGOGASTRODUODENOSCOPY (EGD) (N/A) as a surgical intervention .  The patient's history has been reviewed, patient examined, no change in status, stable for surgery.  I have reviewed the patient's chart and labs.  Questions were answered to the patient's satisfaction.     Chad [name]

## 2013-01-29 NOTE — Discharge Summary (Signed)
Physician Discharge Summary  Patient ID: Chad [name] MRN: [id number] DOB/AGE: [birth year] 57 y.o.  Admit date: 01/25/2013 Discharge date: 01/29/2013  Primary Care Physician:  [name], MD  Discharge Diagnoses:    . Chest pain- atypical, ruled out for acute cardiac or GI etiology  . Obesity . HTN (hypertension) . HLD (hyperlipidemia) . Diastolic CHF, chronic . Diabetes type 2, controlled  Consults: Cardiology, Dr Johnsie Cancel                   Gastroenterology, Dr. Olevia Perches, Dr. Henrene Pastor   Recommendations for Outpatient Follow-up:  Patient was recommended not to use Naprosyn daily for arthritis pain which is probably exacerbating GERD symptoms Patient was advised diet and weight control   Allergies:   Allergies  Allergen Reactions  . Codeine Nausea Only  . Statins Other (See Comments)    Bad leg cramps  . Sulfa Drugs Cross Reactors Nausea Only     Discharge Medications:   Medication List    STOP taking these medications       naproxen 500 MG tablet  Commonly known as:  NAPROSYN      TAKE these medications       aspirin EC 81 MG tablet  Take 162 mg by mouth daily.     DSS 100 MG Caps  Take 100 mg by mouth daily as needed for mild constipation.     esomeprazole 40 MG capsule  Commonly known as:  NEXIUM  Take 40 mg by mouth 2 (two) times daily.     Flax Seed Oil 1000 MG Caps  Take 1 capsule by mouth 2 (two) times daily.     furosemide 20 MG tablet  Commonly known as:  LASIX  Take 1 tablet (20 mg total) by mouth daily.     glipiZIDE 10 MG 24 hr tablet  Commonly known as:  GLUCOTROL XL  Take 10 mg by mouth 2 (two) times daily.     hydrochlorothiazide 25 MG tablet  Commonly known as:  HYDRODIURIL  Take 25 mg by mouth daily.     HYDROcodone-acetaminophen 10-325 MG per tablet  Commonly known as:  NORCO  Take 1 tablet by mouth every 6 (six) hours as needed for moderate pain. TAKE 1 TABLET BY MOUTH EVERY 8 HOURS AS NEEDED FOR PAIN     levothyroxine 100  MCG tablet  Commonly known as:  SYNTHROID, LEVOTHROID  Take 100 mcg by mouth daily.     lisinopril 40 MG tablet  Commonly known as:  PRINIVIL,ZESTRIL  Take 40 mg by mouth daily.     meclizine 25 MG tablet  Commonly known as:  ANTIVERT  Take 1 tablet (25 mg total) by mouth 2 (two) times daily as needed for dizziness or nausea.     metFORMIN 1000 MG tablet  Commonly known as:  GLUCOPHAGE  Take 1,000-1,500 mg by mouth 2 (two) times daily with a meal. Pt takes 1000 mg in the morning and 1500 mg at night     metoprolol tartrate 25 MG tablet  Commonly known as:  LOPRESSOR  Take 12.5 mg by mouth 2 (two) times daily.     multivitamin with minerals Tabs tablet  Take 1 tablet by mouth daily.     nitroGLYCERIN 0.4 MG SL tablet  Commonly known as:  NITROSTAT  Place 1 tablet (0.4 mg total) under the tongue every 5 (five) minutes as needed for chest pain.     omega-3 acid ethyl esters 1 G capsule  Commonly  known as:  LOVAZA  Take 2 g by mouth 2 (two) times daily.     pioglitazone 30 MG tablet  Commonly known as:  ACTOS  Take 15 mg by mouth daily.     polyethylene glycol powder powder  Commonly known as:  GLYCOLAX/MIRALAX  Take 17 g by mouth daily.     potassium chloride 10 MEQ tablet  Commonly known as:  K-DUR  Take 10 mEq by mouth 2 (two) times daily as needed.     Red Yeast Rice 600 MG Caps  Take 2 capsules by mouth daily.     REGLAN 10 MG tablet  Generic drug:  metoCLOPramide  Take 10 mg by mouth 2 (two) times daily as needed for nausea.     simethicone 80 MG chewable tablet  Commonly known as:  MYLICON  Chew 1 tablet (80 mg total) by mouth every 6 (six) hours as needed for flatulence.     sitaGLIPtin 50 MG tablet  Commonly known as:  JANUVIA  Take 1 tablet (50 mg total) by mouth daily.     sucralfate 1 GM/10ML suspension  Commonly known as:  CARAFATE  Take 10 mLs (1 g total) by mouth 4 (four) times daily.     traMADol 50 MG tablet  Commonly known as:  ULTRAM   Take 1 tablet (50 mg total) by mouth every 12 (twelve) hours as needed for moderate pain (for arthritis pain).         Brief H and P: For complete details please refer to admission H and P, but in briefPatient is a 57 year old male with history of hypertension, hyperlipidemia, severe GERD, OSA, obesity, history of chronic diastolic CHF, improving after he was placed on Lasix by his cardiologist presented to the ER with intermittent episodes of chest pains for the last 2 weeks. History was obtained from the patient who stated that he started having chest pain episodes, described as "burning episodes", "somebody hitting the chest" with the shortness of breath and nausea radiating to the left arm. He was already has been taking Nexium 40 mg twice a day, Carafate 1 gTID and HS, Reglan with no significant improvement. First he reported that the pain was also worsened after food, however he also noticed that it worsened with exertion, after walking about 50 feet to his mailbox. He denied any vomiting or hematemesis, hematochezia or melena. Denied any dysphagia.  Patient reports that he had cardiac catheterization done in 2012 and had shown 60% blockage in LAD but had no stent placed. Patient also reported that the chest pain episodes were worse on the day of admission with improvement with nitroglycerin patch.   Hospital Course:   Chest pain: Atypical with a GI features as well as typical cardiac features. Patient was admitted and ruled out for acute ACS. Troponins remain negative and EKG to not show acute ST-T wave changes suggestive of ACS. Cardiology was consulted and patient underwent cardiac catheterization, which showed EF 65% mild nonobstructive coronary disease, normal LV systolic function. Patient was continued on medical management with  aspirin, Coreg, Lasix, lisinopril, lovaza. He had been placed on PPI. Gastroenterology consult was also called in patient underwent endoscopy after the cardiac  cath was clean and showed no obstructive coronary disease Interestingly EGD was also normal and showed normal esophagus prior bariatric surgery with some retained gastric contents otherwise normal exam. Patient already has a followup appointment with Dr. Hilarie Fredrickson ( gastroenterology) outpatient.   Diabetes type 2: Uncontrolled patient was placed on  sliding scale insulin while inpatient however he is on oral hypoglycemics and reports that his blood sugars are mostly controlled at home.   HTN (hypertension) - Stable   HLD (hyperlipidemia) - Continue lovaza , he is intolerant of statins.  Diastolic CHF, chronic - Currently compensated and stable, continue Lasix   Day of Discharge BP 111/42  Pulse 71  Temp(Src) 98.3 F (36.8 C) (Oral)  Resp 16  Ht 5' 9"  (1.753 m)  Wt 168.194 kg (370 lb 12.8 oz)  BMI 54.73 kg/m2  SpO2 91%  Physical Exam: General: Alert and awake oriented x3 not in any acute distress. CVS: S1-S2 clear no murmur rubs or gallops Chest: clear to auscultation bilaterally, no wheezing rales or rhonchi Abdomen: obese soft nontender, nondistended, normal bowel sounds Extremities: no cyanosis, clubbing or edema noted bilaterally Neuro: Cranial nerves II-XII intact, no focal neurological deficits   The results of significant diagnostics from this hospitalization (including imaging, microbiology, ancillary and laboratory) are listed below for reference.    LAB RESULTS: Basic Metabolic Panel:  Recent Labs Lab 01/26/13 0153 01/28/13 0819  NA 133* 132*  K 3.7 4.4  CL 96 95*  CO2 26 27  GLUCOSE 193* 254*  BUN 18 20  CREATININE 1.09 1.21  CALCIUM 8.7 9.2   Liver Function Tests:  Recent Labs Lab 01/25/13 1533  AST 59*  ALT 47  ALKPHOS 129*  BILITOT 0.9  PROT 6.9  ALBUMIN 3.9   No results found for this basename: LIPASE, AMYLASE,  in the last 168 hours No results found for this basename: AMMONIA,  in the last 168 hours CBC:  Recent Labs Lab 01/25/13 2025  01/26/13 0153  WBC 6.3 5.4  HGB 12.7* 11.8*  HCT 38.4* 35.8*  MCV 84.0 84.6  PLT 155 121*   Cardiac Enzymes:  Recent Labs Lab 01/26/13 0153 01/26/13 0835  CKTOTAL 66 73  CKMB 1.5 1.7  TROPONINI <0.30 <0.30   BNP: No components found with this basename: POCBNP,  CBG:  Recent Labs Lab 01/28/13 2111 01/29/13 0727  GLUCAP 222* 205*    Significant Diagnostic Studies:  Dg Chest 2 View  01/25/2013   CLINICAL DATA:  Chest pain.  EXAM: CHEST  2 VIEW  COMPARISON:  Same day.  FINDINGS: Stable cardiomediastinal silhouette. No pneumothorax or pleural effusion is noted. Mild anterior osteophyte formation is seen involving the lower thoracic spine. Old left rib fracture is again noted. No acute pulmonary disease is noted.  IMPRESSION: No acute cardiopulmonary abnormality seen.   Electronically Signed   By: Sabino Dick M.D.   On: 01/25/2013 16:41   Dg Chest 2 View  01/25/2013   CLINICAL DATA:  Chest pain  EXAM: CHEST  2 VIEW  COMPARISON:  05/03/2010  FINDINGS: Low lung volumes. The cardiac silhouette is mild moderately enlarged. No focal reason consolidation or focal infiltrates. Healed lateral rib fracture on the left.  IMPRESSION: Low lung volumes.  No active cardiopulmonary disease.   Electronically Signed   By: Margaree Mackintosh M.D.   On: 01/25/2013 14:10    Cardiac cath 01/28/13:   Angiographic Findings:  Left main: No obstructive disease.  Left Anterior Descending Artery: Large caliber vessel that courses to the apex. The mid vessel has several 30% stenoses. The diagonal branch is moderate and has no obstructive disease.  Circumflex Artery: Large caliber vessel with two early obtuse marginal branches. No obstructive disease.  Right Coronary Artery: Large caliber dominant vessel with no obstructive disease.  Left Ventricular Angiogram: LVEF=65%.  Impression:  1. Mild non-obstructive CAD  2. Normal LV systolic function  3. Non-cardiac chest pain    Disposition and Follow-up:      Discharge Orders   Future Appointments Provider Department Dept Phone   02/20/2013 9:00 AM Jerene Bears, MD [facility] [phone]   04/15/2013 8:05 AM [facility] [place] [facility] [place] [phone]   04/19/2013 10:30 AM [name], MD [place] [facility] [phone]   Future Orders Complete By Expires   Diet Carb Modified  As directed    Increase activity slowly  As directed        DISPOSITION:  home DIET:  carb modified    DISCHARGE FOLLOW-UP Follow-up Information   Follow up with [name], MD. Schedule an appointment as soon as possible for a visit in 2 weeks. (for hospital follow-up)    Specialty:  Family Medicine   Contact information:   [place] [address] [phone]       Follow up with Jerene Bears, MD On 02/20/2013. (at 9:00AM)    Specialty:  Gastroenterology   Contact information:   [address] [place] [address] [phone]       Follow up with Ida Rogue, MD. Schedule an appointment as soon as possible for a visit in 2 weeks.   Specialty:  Cardiology   Contact information:   [facility] [place] [address] [phone]       Time spent on Discharge:  35 minutes   Signed:   Fillmore Bynum M.D. [facility] 01/29/2013, 10:28 AM Pager: [phone]

## 2013-01-29 NOTE — Progress Notes (Signed)
Inpatient Diabetes Program Recommendations  AACE/ADA: New Consensus Statement on Inpatient Glycemic Control (2013)  Target Ranges:  Prepandial:   less than 140 mg/dL      Peak postprandial:   less than 180 mg/dL (1-2 hours)      Critically ill patients:  140 - 180 mg/dL     Results for SAYER, MASINI (MRN [id number]) as of 01/29/2013 08:44  Ref. Range 01/28/2013 07:28 01/28/2013 11:46 01/28/2013 16:33 01/28/2013 21:11  Glucose-Capillary Latest Range: 70-99 mg/dL 230 (H) 215 (H) 260 (H) 222 (H)    Results for JADRIEN, NARINE (MRN [id number]) as of 01/29/2013 08:44  Ref. Range 01/29/2013 07:27  Glucose-Capillary Latest Range: 70-99 mg/dL 205 (H)    **Noted that Dr. Tana Coast prescribed Levemir 15 units QHS yesterday.    **Patient /family refused to take Levemir insulin last night.  CBGs will likely be elevated today since patient/family refused Levemir last night.   Will follow. [name] RN, MSN, CDE Diabetes Coordinator Inpatient Diabetes Program Team Pager: [phone] (8a-10p)

## 2013-01-29 NOTE — ED Provider Notes (Signed)
Pt is here with chest pain which he states started approximately 2 weeks ago, has been somewhat intermittent, worse with exertion but also feels during on exertional activity in last. He appears to be sterile, bilateral lower chest, radiation to the left neck and left upper extremity. Associated nausea and decreased appetite. Prior cardiac catheterization from 2012 showed left anterior descending moderate calcifications, but no other significant disease. Troponin normal, EKG abnormal with bigeminy in a right bundle branch block with PVCs, not entirely changed from prior EKG, patient will need admission to the hospital due to underlying risk and ongoing chest pain.  Pt d/w hospitalist - they agree to admit for observation. ASA given in ED   Medical screening examination/treatment/procedure(s) were conducted as a shared visit with non-physician practitioner(s) and myself. I personally evaluated the patient during the encounter.   Clinical Impression: Chest Pain   Johnna Acosta, MD 01/29/13 [phone]

## 2013-01-30 ENCOUNTER — Encounter (HOSPITAL_COMMUNITY): Payer: Self-pay | Admitting: Internal Medicine

## 2013-01-31 ENCOUNTER — Telehealth: Payer: Self-pay

## 2013-01-31 NOTE — Telephone Encounter (Signed)
Patient contacted regarding discharge from [facility] on 01/26/13.  Patient understands to follow up with provider Dr. [name] on 02/11/13 at 10:15 at [facility]. Patient understands discharge instructions? yes Patient understands medications and regiment? yes Patient understands to bring all medications to this visit? yes

## 2013-02-01 ENCOUNTER — Telehealth: Payer: Self-pay | Admitting: Family Medicine

## 2013-02-01 NOTE — Telephone Encounter (Signed)
Pt discharged on 01/29/2013 - can we call today for f/u phone call and schedule appt in the next 1-2 wks for f/u? Thanks Noncardiac chest pain with stable catheterization.  EGD also unrevealing.

## 2013-02-09 NOTE — Telephone Encounter (Signed)
F/u phone call already made by cardiology.

## 2013-02-11 ENCOUNTER — Encounter: Payer: Self-pay | Admitting: Cardiovascular Disease

## 2013-02-11 ENCOUNTER — Ambulatory Visit (INDEPENDENT_AMBULATORY_CARE_PROVIDER_SITE_OTHER): Payer: Medicare HMO | Admitting: Cardiovascular Disease

## 2013-02-11 VITALS — BP 106/60 | HR 73 | Ht 69.0 in | Wt 367.2 lb

## 2013-02-11 DIAGNOSIS — I2510 Atherosclerotic heart disease of native coronary artery without angina pectoris: Secondary | ICD-10-CM | POA: Diagnosis not present

## 2013-02-11 DIAGNOSIS — E669 Obesity, unspecified: Secondary | ICD-10-CM

## 2013-02-11 DIAGNOSIS — K219 Gastro-esophageal reflux disease without esophagitis: Secondary | ICD-10-CM

## 2013-02-11 DIAGNOSIS — I5032 Chronic diastolic (congestive) heart failure: Secondary | ICD-10-CM

## 2013-02-11 DIAGNOSIS — I10 Essential (primary) hypertension: Secondary | ICD-10-CM

## 2013-02-11 NOTE — Assessment & Plan Note (Signed)
Suggested he continue to hold his NSAIDs. Continue on his PPI

## 2013-02-11 NOTE — Assessment & Plan Note (Signed)
Encouraged him to stay on his Lasix daily. He appears relatively euvolemic on today's visit

## 2013-02-11 NOTE — Patient Instructions (Signed)
You are doing well. No medication changes were made.  Please call us if you have new issues that need to be addressed before your next appt.  Your physician wants you to follow-up in: 12 months.  You will receive a reminder letter in the mail two months in advance. If you don't receive a letter, please call our office to schedule the follow-up appointment. 

## 2013-02-11 NOTE — Assessment & Plan Note (Signed)
Minimal CAD on recent catheterization. No further ectopy seen on EKG. No further workup at this time

## 2013-02-11 NOTE — Progress Notes (Signed)
Patient ID: Chad [name], male    DOB: 1955/05/06, 57 y.o.   MRN: [id number]  HPI Comments: Chad [name] is a very pleasant 57 year old gentleman with a history of morbid obesity,  obstructive sleep apnea not on CPAP, history of gastric bypass, bladder history of throat surgery for sleep apnea though the details are unavailable, history of GERD who sleeps on a wedge who initially presented for abnormal EKG. He had a cardiac catheterization in March of 2012 showing mild disease, moderate calcifications with no intervention needed. Last hemoglobin A1c 6.4  History of diastolic CHF, acute on chronic.  In the past, he was given Lasix twice a day with significant improvement in his weight, edema.   leg ulcers have healed in his lower extremity edema has improved, also he has completed wearing his leg wraps.  chronic shortness of breath. Weight has been trending up over the past year. Initially weight was 369, then up to 378, now up to 382.  In followup today, he reports having abnormal EKG, some abdominal and chest discomfort. He was seen by Dr. Danise Mina, referred to the emergency room for further workup. As he was a large gentleman, he was unable to perform Myoview and cardiac catheterization was performed. Cardiac catheterization dated 01/28/2013 showing 30% mid LAD disease, otherwise no significant stenoses. Normal ejection fraction of 65% This was a radial catheterization. His NSAIDs were held, continued on PPI. Symptoms have improved.  He is continue to lose weight, now down to 356 pounds at home  In the past, he was not able to tolerate CPAP and returned the machine many years ago. He is not interested in having any repeat testing. He has tried cholesterol medications in the past including Lipitor and he reports it has caused profound cramping in his legs to the point where he has gone to the hospital 3 times.  EKG shows normal sinus rhythm with rate 73 beats per minute with  right bundle branch  block  Outpatient Encounter Prescriptions as of 02/11/2013  Medication Sig  . aspirin EC 81 MG tablet Take 162 mg by mouth daily.    Marland Kitchen docusate sodium 100 MG CAPS Take 100 mg by mouth daily as needed for mild constipation.  Marland Kitchen esomeprazole (NEXIUM) 40 MG capsule Take 40 mg by mouth 2 (two) times daily.  . Flaxseed, Linseed, (FLAX SEED OIL) 1000 MG CAPS Take 1 capsule by mouth 2 (two) times daily.    . furosemide (LASIX) 20 MG tablet Take 1 tablet (20 mg total) by mouth daily.  Marland Kitchen glipiZIDE (GLUCOTROL XL) 10 MG 24 hr tablet Take 10 mg by mouth 2 (two) times daily.   . hydrochlorothiazide (HYDRODIURIL) 25 MG tablet Take 25 mg by mouth daily.  Marland Kitchen HYDROcodone-acetaminophen (NORCO) 10-325 MG per tablet Take 1 tablet by mouth every 6 (six) hours as needed for moderate pain. TAKE 1 TABLET BY MOUTH EVERY 8 HOURS AS NEEDED FOR PAIN  . levothyroxine (SYNTHROID, LEVOTHROID) 100 MCG tablet Take 100 mcg by mouth daily.  Marland Kitchen lisinopril (PRINIVIL,ZESTRIL) 40 MG tablet Take 40 mg by mouth daily.  . meclizine (ANTIVERT) 25 MG tablet Take 1 tablet (25 mg total) by mouth 2 (two) times daily as needed for dizziness or nausea.  . metFORMIN (GLUCOPHAGE) 1000 MG tablet Take 1,000-1,500 mg by mouth 2 (two) times daily with a meal. Pt takes 1000 mg in the morning and 1500 mg at night  . metoCLOPramide (REGLAN) 10 MG tablet Take 10 mg by mouth 2 (two) times  daily as needed for nausea.  . metoprolol tartrate (LOPRESSOR) 25 MG tablet Take 12.5 mg by mouth 2 (two) times daily.  . Multiple Vitamin (MULITIVITAMIN WITH MINERALS) TABS Take 1 tablet by mouth daily.  . nitroGLYCERIN (NITROSTAT) 0.4 MG SL tablet Place 1 tablet (0.4 mg total) under the tongue every 5 (five) minutes as needed for chest pain.  Marland Kitchen omega-3 acid ethyl esters (LOVAZA) 1 G capsule Take 2 g by mouth 2 (two) times daily.  . pioglitazone (ACTOS) 30 MG tablet Take 15 mg by mouth daily.  . polyethylene glycol powder (GLYCOLAX/MIRALAX) powder Take 17 g by mouth  daily.  . potassium chloride (K-DUR) 10 MEQ tablet Take 10 mEq by mouth 2 (two) times daily as needed.  . Red Yeast Rice 600 MG CAPS Take 2 capsules by mouth daily.  . simethicone (MYLICON) 80 MG chewable tablet Chew 1 tablet (80 mg total) by mouth every 6 (six) hours as needed for flatulence.  . sitaGLIPtin (JANUVIA) 50 MG tablet Take 1 tablet (50 mg total) by mouth daily.  . sucralfate (CARAFATE) 1 GM/10ML suspension Take 10 mLs (1 g total) by mouth 4 (four) times daily.  . traMADol (ULTRAM) 50 MG tablet Take 1 tablet (50 mg total) by mouth every 12 (twelve) hours as needed for moderate pain (for arthritis pain).     Review of Systems  Constitutional: Negative.   HENT: Negative.   Eyes: Negative.   Respiratory:       Much improved shortness of breath  Gastrointestinal: Negative.   Endocrine: Negative.   Musculoskeletal: Positive for arthralgias.  Skin: Negative.   Allergic/Immunologic: Negative.   Neurological: Negative.   Hematological: Negative.   Psychiatric/Behavioral: Negative.   All other systems reviewed and are negative.    BP 106/60  Pulse 73  Ht 5' 9"  (1.753 m)  Wt 367 lb 4 oz (166.584 kg)  BMI 54.21 kg/m2  Physical Exam  Nursing note and vitals reviewed. Constitutional: He is oriented to person, place, and time. He appears well-developed and well-nourished.  Morbidly obese  HENT:  Head: Normocephalic.  Nose: Nose normal.  Mouth/Throat: Oropharynx is clear and moist.  Eyes: Conjunctivae are normal. Pupils are equal, round, and reactive to light.  Neck: Normal range of motion. Neck supple. No JVD present.  Cardiovascular: Normal rate, regular rhythm, S1 normal, S2 normal, normal heart sounds and intact distal pulses.  Exam reveals no gallop and no friction rub.   No murmur heard. Pulmonary/Chest: Effort normal and breath sounds normal. No respiratory distress. He has no wheezes. He has no rales. He exhibits no tenderness.  Abdominal: Soft. Bowel sounds are  normal. He exhibits no distension. There is no tenderness.  Musculoskeletal: Normal range of motion. He exhibits no edema and no tenderness.  Lymphadenopathy:    He has no cervical adenopathy.  Neurological: He is alert and oriented to person, place, and time. Coordination normal.  Skin: Skin is warm and dry. No rash noted. No erythema.  Psychiatric: He has a normal mood and affect. His behavior is normal. Judgment and thought content normal.      Assessment and Plan

## 2013-02-11 NOTE — Assessment & Plan Note (Signed)
Blood pressure is well controlled on today's visit. No changes made to the medications. 

## 2013-02-11 NOTE — Assessment & Plan Note (Signed)
I'm very encouraged with his 20-30 pound weight loss. Encouraged him to continue his weight loss program

## 2013-02-12 ENCOUNTER — Ambulatory Visit (INDEPENDENT_AMBULATORY_CARE_PROVIDER_SITE_OTHER): Payer: Medicare Other | Admitting: Family Medicine

## 2013-02-12 ENCOUNTER — Encounter: Payer: Self-pay | Admitting: Internal Medicine

## 2013-02-12 ENCOUNTER — Encounter: Payer: Self-pay | Admitting: Family Medicine

## 2013-02-12 VITALS — BP 130/64 | HR 80 | Temp 98.3°F | Wt 369.0 lb

## 2013-02-12 DIAGNOSIS — M1990 Unspecified osteoarthritis, unspecified site: Secondary | ICD-10-CM

## 2013-02-12 DIAGNOSIS — R079 Chest pain, unspecified: Secondary | ICD-10-CM

## 2013-02-12 DIAGNOSIS — M129 Arthropathy, unspecified: Secondary | ICD-10-CM

## 2013-02-12 DIAGNOSIS — K219 Gastro-esophageal reflux disease without esophagitis: Secondary | ICD-10-CM

## 2013-02-12 DIAGNOSIS — E785 Hyperlipidemia, unspecified: Secondary | ICD-10-CM

## 2013-02-12 NOTE — Progress Notes (Signed)
Pre-visit discussion using our clinic review tool. No additional management support is needed unless otherwise documented below in the visit note.  

## 2013-02-12 NOTE — Assessment & Plan Note (Signed)
Continue nexium bid and carafate PRN.
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
## 2013-02-12 NOTE — Assessment & Plan Note (Signed)
Continue RYR.

## 2013-02-12 NOTE — Patient Instructions (Signed)
Keep appointment with Dr. Hilarie Fredrickson. Return to see me in2 months. I'm glad you're feeling better. We will avoid all anti inflammatories from here on out.

## 2013-02-12 NOTE — Assessment & Plan Note (Signed)
Reviewed hospital workup - stable catheterization as well as EGD. Stable from a cardiac status.

## 2013-02-12 NOTE — Assessment & Plan Note (Signed)
Discussed need to avoid all NSAIDs.  Take tylenol and hydrocodone prn.

## 2013-02-19 ENCOUNTER — Other Ambulatory Visit: Payer: Self-pay | Admitting: Family Medicine

## 2013-02-19 NOTE — Telephone Encounter (Signed)
I saw the Reglan had never been prescribed just in historical. Please advise

## 2013-02-20 ENCOUNTER — Other Ambulatory Visit: Payer: Self-pay

## 2013-02-20 ENCOUNTER — Encounter: Payer: Self-pay | Admitting: Internal Medicine

## 2013-02-20 ENCOUNTER — Ambulatory Visit (INDEPENDENT_AMBULATORY_CARE_PROVIDER_SITE_OTHER): Payer: Medicare Other | Admitting: Internal Medicine

## 2013-02-20 VITALS — BP 118/64 | HR 68 | Ht 70.0 in | Wt 368.5 lb

## 2013-02-20 DIAGNOSIS — R6881 Early satiety: Secondary | ICD-10-CM

## 2013-02-20 DIAGNOSIS — K5900 Constipation, unspecified: Secondary | ICD-10-CM | POA: Diagnosis not present

## 2013-02-20 DIAGNOSIS — H9311 Tinnitus, right ear: Secondary | ICD-10-CM

## 2013-02-20 DIAGNOSIS — H9319 Tinnitus, unspecified ear: Secondary | ICD-10-CM

## 2013-02-20 DIAGNOSIS — K219 Gastro-esophageal reflux disease without esophagitis: Secondary | ICD-10-CM | POA: Diagnosis not present

## 2013-02-20 DIAGNOSIS — R079 Chest pain, unspecified: Secondary | ICD-10-CM

## 2013-02-20 DIAGNOSIS — K3184 Gastroparesis: Secondary | ICD-10-CM

## 2013-02-20 MED ORDER — HYDROCODONE-ACETAMINOPHEN 10-325 MG PO TABS: 1.0000 | ORAL_TABLET | Freq: Three times a day (TID) | ORAL | Status: DC | PRN

## 2013-02-20 MED ORDER — METOCLOPRAMIDE HCL 10 MG PO TABS: 10.0000 mg | ORAL_TABLET | Freq: Three times a day (TID) | ORAL | Status: DC

## 2013-02-20 MED ORDER — ESOMEPRAZOLE MAGNESIUM 40 MG PO CPDR: 40.0000 mg | DELAYED_RELEASE_CAPSULE | Freq: Two times a day (BID) | ORAL | Status: DC

## 2013-02-20 NOTE — Telephone Encounter (Signed)
Mrs Nunn left v/m requesting rx hydrocodone apap. Call when ready for pick up. Pt is out of med and request to pick up rx later this AM. Pt has appt at Dr Vena Rua this morning and will stop by our office to see if rx ready for pick up. Does not have cell phone.

## 2013-02-20 NOTE — Patient Instructions (Signed)
We have sent the following medications to your pharmacy for you to pick up at your convenience: nexium, Reglan  Discontinue taking carafate  Follow up with Dr. Hilarie Fredrickson in office in 2 months  You have an appointment with Dr. Pryor Ochoa on 02/25/2013 @ 2:15pm                                               We are excited to introduce MyChart, a new best-in-class service that provides you online access to important information in your electronic medical record. We want to make it easier for you to view your health information - all in one secure location - when and where you need it. We expect MyChart will enhance the quality of care and service we provide.  When you register for MyChart, you can:    View your test results.    Request appointments and receive appointment reminders via email.    Request medication renewals.    View your medical history, allergies, medications and immunizations.    Communicate with your physician's office through a password-protected site.    Conveniently print information such as your medication lists.  To find out if MyChart is right for you, please talk to a member of our clinical staff today. We will gladly answer your questions about this free health and wellness tool.  If you are age 57 or older and want a member of your family to have access to your record, you must provide written consent by completing a proxy form available at our office. Please speak to our clinical staff about guidelines regarding accounts for patients younger than age 50.  As you activate your MyChart account and need any technical assistance, please call the MyChart technical support line at [phone]) or email your question to [email or web address]. If you email your question(s), please include your name, a return phone number and the best time to reach you.  If you have non-urgent health-related questions, you can send a message to our office through [place] at  [email or web address]. If you have a medical emergency, call 911.  Thank you for using MyChart as your new health and wellness resource!   MyChart licensed from [employer],  1999-2010. Patents Pending.
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
## 2013-02-20 NOTE — Telephone Encounter (Signed)
No answering machine, phone just rang, then it asked for a "remote code"? Will leave rx up front.

## 2013-02-27 DIAGNOSIS — I5032 Chronic diastolic (congestive) heart failure: Secondary | ICD-10-CM | POA: Diagnosis not present

## 2013-02-27 DIAGNOSIS — I2510 Atherosclerotic heart disease of native coronary artery without angina pectoris: Secondary | ICD-10-CM | POA: Diagnosis not present

## 2013-02-27 DIAGNOSIS — E669 Obesity, unspecified: Secondary | ICD-10-CM

## 2013-02-27 DIAGNOSIS — K219 Gastro-esophageal reflux disease without esophagitis: Secondary | ICD-10-CM

## 2013-02-27 DIAGNOSIS — I509 Heart failure, unspecified: Secondary | ICD-10-CM

## 2013-02-27 DIAGNOSIS — I10 Essential (primary) hypertension: Secondary | ICD-10-CM | POA: Diagnosis not present

## 2013-03-04 ENCOUNTER — Other Ambulatory Visit: Payer: Self-pay | Admitting: *Deleted

## 2013-03-04 MED ORDER — POTASSIUM CHLORIDE ER 10 MEQ PO TBCR: 10.0000 meq | EXTENDED_RELEASE_TABLET | Freq: Two times a day (BID) | ORAL | Status: DC | PRN

## 2013-03-04 MED ORDER — FUROSEMIDE 20 MG PO TABS: 20.0000 mg | ORAL_TABLET | Freq: Two times a day (BID) | ORAL | Status: DC | PRN

## 2013-03-04 NOTE — Telephone Encounter (Signed)
Requested Prescriptions   Signed Prescriptions Disp Refills  . furosemide (LASIX) 20 MG tablet 180 tablet 3    Sig: Take 1 tablet (20 mg total) by mouth 2 (two) times daily as needed.    Authorizing Provider: Minna Merritts    Ordering User: Eugenio Hoes, MARINA C  . potassium chloride (K-DUR) 10 MEQ tablet 180 tablet 3    Sig: Take 1 tablet (10 mEq total) by mouth 2 (two) times daily as needed.    Authorizing Provider: Minna Merritts    Ordering User: Britt Bottom

## 2013-03-05 ENCOUNTER — Encounter: Payer: Self-pay | Admitting: Family Medicine

## 2013-03-07 ENCOUNTER — Other Ambulatory Visit: Payer: Self-pay | Admitting: *Deleted

## 2013-03-07 NOTE — Telephone Encounter (Signed)
[name] says a Rx was written by another MD in our practice for #30 in Dr. Synthia Innocent absence.  The patient normally gets #60 per month and would like a Rx for the additional 30 tabs.  Please call when Rx is ready for pick up.

## 2013-03-08 MED ORDER — HYDROCODONE-ACETAMINOPHEN 10-325 MG PO TABS: 1.0000 | ORAL_TABLET | Freq: Three times a day (TID) | ORAL | Status: DC | PRN

## 2013-03-08 NOTE — Telephone Encounter (Signed)
Message left notifying patient. Rx placed up front for pick up.

## 2013-03-08 NOTE — Telephone Encounter (Signed)
printed #60 and placed in Kim's box.

## 2013-03-13 ENCOUNTER — Telehealth: Payer: Self-pay

## 2013-03-13 NOTE — Telephone Encounter (Signed)
Message left advising patient.

## 2013-03-13 NOTE — Telephone Encounter (Signed)
Mrs. Anctil left v/m; since starting new sugar pill,Januvia 50 mg taking one tab in AM; pts FBS is averaging between 68-80, pt feels shakey and sweaty, after pt eats pt feels OK and has no problem the remainder of the day;normal FBS for pt is 120. Mrs Sunday said since constipation has cleared pt's blood sugar has gone down. Pt has not taken Januvia today and will not take until gets instructions from Dr Darnell Level. Today pts FBS was 79; pt has eaten and feels OK now.[name] wants to know if Januvia should be stopped.[name] request cb. Advised if pts condition changes or worsens before receives cb to call the office. [name] voiced understanding.

## 2013-03-13 NOTE — Telephone Encounter (Signed)
On actos 64m daily, metformin 10060mbid, glipizide XL 1059mid, and januvia 66m3mily. Lab Results  Component Value Date   HGBA1C 8.4* 01/25/2013  let's stop PM glipizide XL 10mg78me, just continue 10mg 52mose. May hold januvia for next few days to ensure sugars are not running too low, then would recommend trial back onto januvia 66mg d78m. Call me with questions.

## 2013-03-27 ENCOUNTER — Telehealth: Payer: Self-pay | Admitting: Internal Medicine

## 2013-03-28 MED ORDER — METOCLOPRAMIDE HCL 10 MG PO TABS: 10.0000 mg | ORAL_TABLET | Freq: Three times a day (TID) | ORAL | Status: DC

## 2013-03-28 NOTE — Telephone Encounter (Signed)
Sent in #90 to pts pharmacy.

## 2013-04-01 ENCOUNTER — Other Ambulatory Visit: Payer: Self-pay | Admitting: Gastroenterology

## 2013-04-01 ENCOUNTER — Telehealth: Payer: Self-pay

## 2013-04-01 MED ORDER — METOCLOPRAMIDE HCL 10 MG PO TABS: 10.0000 mg | ORAL_TABLET | Freq: Three times a day (TID) | ORAL | Status: DC

## 2013-04-01 NOTE — Telephone Encounter (Signed)
Chad [name] received notification from [facility] pt needs to change to different med from Nexium and pt does not want to change meds. Chad [name] will bring letter to office (not sure what day).

## 2013-04-04 ENCOUNTER — Telehealth: Payer: Self-pay

## 2013-04-04 ENCOUNTER — Other Ambulatory Visit: Payer: Self-pay

## 2013-04-04 MED ORDER — HYDROCODONE-ACETAMINOPHEN 10-325 MG PO TABS: 1.0000 | ORAL_TABLET | Freq: Three times a day (TID) | ORAL | Status: DC | PRN

## 2013-04-04 NOTE — Telephone Encounter (Signed)
printed and placed in Kim's box.

## 2013-04-04 NOTE — Telephone Encounter (Signed)
Opened in error

## 2013-04-04 NOTE — Telephone Encounter (Signed)
Rx placed up front for pick up and message left advising patient.

## 2013-04-04 NOTE — Telephone Encounter (Signed)
Chad [name] request rx hydrocodone apap. Call when ready for pick up.

## 2013-04-14 ENCOUNTER — Other Ambulatory Visit: Payer: Self-pay | Admitting: Family Medicine

## 2013-04-14 ENCOUNTER — Encounter: Payer: Self-pay | Admitting: Family Medicine

## 2013-04-14 DIAGNOSIS — E785 Hyperlipidemia, unspecified: Secondary | ICD-10-CM

## 2013-04-14 DIAGNOSIS — K3184 Gastroparesis: Secondary | ICD-10-CM

## 2013-04-14 DIAGNOSIS — Z125 Encounter for screening for malignant neoplasm of prostate: Secondary | ICD-10-CM

## 2013-04-14 DIAGNOSIS — E1143 Type 2 diabetes mellitus with diabetic autonomic (poly)neuropathy: Secondary | ICD-10-CM | POA: Insufficient documentation

## 2013-04-14 DIAGNOSIS — E119 Type 2 diabetes mellitus without complications: Secondary | ICD-10-CM

## 2013-04-14 DIAGNOSIS — D649 Anemia, unspecified: Secondary | ICD-10-CM

## 2013-04-14 DIAGNOSIS — E039 Hypothyroidism, unspecified: Secondary | ICD-10-CM

## 2013-04-14 DIAGNOSIS — I10 Essential (primary) hypertension: Secondary | ICD-10-CM

## 2013-04-15 ENCOUNTER — Other Ambulatory Visit (INDEPENDENT_AMBULATORY_CARE_PROVIDER_SITE_OTHER): Payer: Commercial Managed Care - HMO

## 2013-04-15 DIAGNOSIS — Z125 Encounter for screening for malignant neoplasm of prostate: Secondary | ICD-10-CM | POA: Diagnosis not present

## 2013-04-15 DIAGNOSIS — E039 Hypothyroidism, unspecified: Secondary | ICD-10-CM | POA: Diagnosis not present

## 2013-04-15 DIAGNOSIS — D649 Anemia, unspecified: Secondary | ICD-10-CM | POA: Diagnosis not present

## 2013-04-15 DIAGNOSIS — E119 Type 2 diabetes mellitus without complications: Secondary | ICD-10-CM

## 2013-04-15 DIAGNOSIS — I10 Essential (primary) hypertension: Secondary | ICD-10-CM | POA: Diagnosis not present

## 2013-04-15 LAB — BASIC METABOLIC PANEL
BUN: 20 mg/dL (ref 6–23)
CO2: 26 mEq/L (ref 19–32)
Calcium: 9.4 mg/dL (ref 8.4–10.5)
Chloride: 103 mEq/L (ref 96–112)
Creatinine, Ser: 1.1 mg/dL (ref 0.4–1.5)
GFR: 75.41 mL/min (ref >60.00)
GLUCOSE: 193 mg/dL — AB (ref 70–99)
POTASSIUM: 4.3 meq/L (ref 3.5–5.1)
Sodium: 140 mEq/L (ref 135–145)

## 2013-04-15 LAB — CBC WITH DIFFERENTIAL/PLATELET
BASOS PCT: 0.3 % (ref 0.0–3.0)
Basophils Absolute: 0 10*3/uL (ref 0.0–0.1)
Eosinophils Absolute: 0.1 10*3/uL (ref 0.0–0.7)
Eosinophils Relative: 1.7 % (ref 0.0–5.0)
HEMATOCRIT: 39.7 % (ref 39.0–52.0)
HEMOGLOBIN: 12.6 g/dL — AB (ref 13.0–17.0)
LYMPHS ABS: 0.7 10*3/uL (ref 0.7–4.0)
LYMPHS PCT: 12 % (ref 12.0–46.0)
MCHC: 31.8 g/dL (ref 30.0–36.0)
MCV: 85.1 fl (ref 78.0–100.0)
MONO ABS: 0.5 10*3/uL (ref 0.1–1.0)
Monocytes Relative: 9 % (ref 3.0–12.0)
Neutro Abs: 4.3 10*3/uL (ref 1.4–7.7)
Neutrophils Relative %: 77 % (ref 43.0–77.0)
Platelets: 146 10*3/uL — ABNORMAL LOW (ref 150.0–400.0)
RBC: 4.67 Mil/uL (ref 4.22–5.81)
RDW: 14.5 % (ref 11.5–14.6)
WBC: 5.5 10*3/uL (ref 4.5–10.5)

## 2013-04-15 LAB — TSH: TSH: 3.21 u[IU]/mL (ref 0.35–5.50)

## 2013-04-15 LAB — PSA, MEDICARE: PSA: 0.8 ng/ml (ref 0.10–4.00)

## 2013-04-16 ENCOUNTER — Telehealth: Payer: Self-pay | Admitting: *Deleted

## 2013-04-16 NOTE — Telephone Encounter (Signed)
Received a letter via mail from [facility] that Nexium required a prior authorization. Forms obtained, were completed by Dr, and faxed to [facility]. Pending notification from insurance.

## 2013-04-17 NOTE — Telephone Encounter (Signed)
Nexium denied. Letter in your IN box for review.

## 2013-04-19 ENCOUNTER — Encounter: Payer: Medicare Other | Admitting: Family Medicine

## 2013-04-19 ENCOUNTER — Encounter: Payer: Self-pay | Admitting: Internal Medicine

## 2013-04-23 ENCOUNTER — Ambulatory Visit: Payer: Medicare Other | Admitting: Internal Medicine

## 2013-04-26 ENCOUNTER — Ambulatory Visit (INDEPENDENT_AMBULATORY_CARE_PROVIDER_SITE_OTHER): Payer: Medicare HMO | Admitting: Family Medicine

## 2013-04-26 ENCOUNTER — Encounter: Payer: Self-pay | Admitting: Family Medicine

## 2013-04-26 VITALS — BP 122/72 | HR 68 | Temp 97.8°F | Ht 71.25 in | Wt 366.8 lb

## 2013-04-26 DIAGNOSIS — I10 Essential (primary) hypertension: Secondary | ICD-10-CM

## 2013-04-26 DIAGNOSIS — E119 Type 2 diabetes mellitus without complications: Secondary | ICD-10-CM

## 2013-04-26 DIAGNOSIS — K3184 Gastroparesis: Secondary | ICD-10-CM

## 2013-04-26 DIAGNOSIS — K219 Gastro-esophageal reflux disease without esophagitis: Secondary | ICD-10-CM

## 2013-04-26 DIAGNOSIS — Z0000 Encounter for general adult medical examination without abnormal findings: Secondary | ICD-10-CM

## 2013-04-26 DIAGNOSIS — E039 Hypothyroidism, unspecified: Secondary | ICD-10-CM

## 2013-04-26 DIAGNOSIS — E785 Hyperlipidemia, unspecified: Secondary | ICD-10-CM

## 2013-04-26 DIAGNOSIS — E1143 Type 2 diabetes mellitus with diabetic autonomic (poly)neuropathy: Secondary | ICD-10-CM

## 2013-04-26 DIAGNOSIS — E1149 Type 2 diabetes mellitus with other diabetic neurological complication: Secondary | ICD-10-CM

## 2013-04-26 NOTE — Progress Notes (Signed)
Pre visit review using our clinic review tool, if applicable. No additional management support is needed unless otherwise documented below in the visit note. 

## 2013-04-26 NOTE — Assessment & Plan Note (Signed)
Stable on reglan 61m tid.

## 2013-04-26 NOTE — Assessment & Plan Note (Addendum)
I have personally reviewed the Medicare Annual Wellness questionnaire and have noted 1. The patient's medical and social history 2. Their use of alcohol, tobacco or illicit drugs 3. Their current medications and supplements 4. The patient's functional ability including ADL's, fall risks, home safety risks and hearing or visual impairment. 5. Diet and physical activity 6. Evidence for depression or mood disorders The patients weight, height, BMI have been recorded in the chart.  Hearing and vision has been addressed. I have made referrals, counseling and provided education to the patient based review of the above and I have provided the pt with a written personalized care plan for preventive services. See scanned questionairre. Advanced directives discussed: packet provided x2.  Reviewed preventative protocols and updated unless pt declined.  UTD immnizations. PSA reassuring. Will continue to monitor R prostate lobe swelling.

## 2013-04-26 NOTE — Assessment & Plan Note (Signed)
Chronic, severe with DM gastroparesis.  Currently stable period on nexium 18m bid and reglan 174mtid.  Insurance has declined nexium bid dosing.  Provided with #30 nexium 4052mamples today.

## 2013-04-26 NOTE — Patient Instructions (Addendum)
Nexium samples provided today. Advanced directive packet provided today. Check on audiologist for new hearing aides. Good to see you today, call us with questions. Return as needed or in 3 months for follow up diabetes.

## 2013-04-26 NOTE — Assessment & Plan Note (Signed)
Chronic, stable. Continue med.

## 2013-04-26 NOTE — Assessment & Plan Note (Signed)
Chronic, stable . Continue RYR

## 2013-04-26 NOTE — Assessment & Plan Note (Signed)
Chronic. A1c elevated last visit.  Will return in 3 mo for DM f/u.

## 2013-04-26 NOTE — Assessment & Plan Note (Signed)
Chronic, stable. Continue med.s

## 2013-04-26 NOTE — Progress Notes (Signed)
BP 122/72  Pulse 68  Temp(Src) 97.8 F (36.6 C) (Oral)  Ht 5' 11.25" (1.81 m)  Wt 366 lb 12 oz (166.357 kg)  BMI 50.78 kg/m2  SpO2 97%   CC: medicare wellness visit  Subjective:    Patient ID: Chad [name], male    DOB: 20-Feb-1956, 58 y.o.   MRN: [id number]  HPI: Chad [name] is a 58 y.o. male presenting on 04/26/2013 with medicare wellness visit   Wt Readings from Last 3 Encounters:  04/26/13 366 lb 12 oz (166.357 kg)  02/20/13 368 lb 8 oz (167.151 kg)  02/12/13 369 lb (167.377 kg)  Body mass index is 50.78 kg/(m^2).   Taking reglan 19m tid.  Takng nexium 648mbid.  Nexium has not been approved by insurance  Preventative:  colonoscopy 10/2006 - diverticulosis, int hemorrhoids, 1 hyperplastic polyp (isaacs) Prostate - due today. Normal in past. Flu shot 10/2011  Tetanus shot - 01/2011  Pneumovax 08/2011 Discussed advanced directives, doesn't think would want prolonged life support, but will think about this.  Requests packet for advanced directives.  Failed vision on right eye (cataract) and hearing screens.  Due for f/u with audiologist. Denies depression or anhedonia.  No falls.  Relevant past medical, surgical, family and social history reviewed and updated as indicated.  Allergies and medications reviewed and updated. Current Outpatient Prescriptions on File Prior to Visit  Medication Sig  . aspirin EC 81 MG tablet Take 162 mg by mouth daily.    . Marland Kitchenocusate sodium 100 MG CAPS Take 100 mg by mouth daily as needed for mild constipation.  . Marland Kitchensomeprazole (NEXIUM) 40 MG capsule Take 1 capsule (40 mg total) by mouth 2 (two) times daily.  . Flaxseed, Linseed, (FLAX SEED OIL) 1000 MG CAPS Take 1 capsule by mouth 2 (two) times daily.    . furosemide (LASIX) 20 MG tablet Take 1 tablet (20 mg total) by mouth 2 (two) times daily as needed.  . Marland KitchenlipiZIDE (GLUCOTROL XL) 10 MG 24 hr tablet Take 1 tablet (10 mg total) by mouth daily with breakfast.  . hydrochlorothiazide  (HYDRODIURIL) 25 MG tablet Take 25 mg by mouth daily.  . Marland KitchenYDROcodone-acetaminophen (NORCO) 10-325 MG per tablet Take 1 tablet by mouth 3 (three) times daily as needed for moderate pain.  . Marland Kitchenevothyroxine (SYNTHROID, LEVOTHROID) 100 MCG tablet TAKE 1 TABLET BY MOUTH ONCE A DAY  . lisinopril (PRINIVIL,ZESTRIL) 40 MG tablet Take 40 mg by mouth daily.  . meclizine (ANTIVERT) 25 MG tablet Take 1 tablet (25 mg total) by mouth 2 (two) times daily as needed for dizziness or nausea.  . metFORMIN (GLUCOPHAGE) 1000 MG tablet Take 1,000-1,500 mg by mouth 2 (two) times daily with a meal. Pt takes 1000 mg in the morning and 1500 mg at night  . metoCLOPramide (REGLAN) 10 MG tablet Take 1 tablet (10 mg total) by mouth 3 (three) times daily before meals.  . metoprolol tartrate (LOPRESSOR) 25 MG tablet Take 12.5 mg by mouth 2 (two) times daily.  . Multiple Vitamin (MULITIVITAMIN WITH MINERALS) TABS Take 1 tablet by mouth daily.  . nitroGLYCERIN (NITROSTAT) 0.4 MG SL tablet Place 1 tablet (0.4 mg total) under the tongue every 5 (five) minutes as needed for chest pain.  . Marland Kitchenmega-3 acid ethyl esters (LOVAZA) 1 G capsule Take 2 g by mouth 2 (two) times daily.  . pioglitazone (ACTOS) 30 MG tablet Take 15 mg by mouth daily.  . polyethylene glycol powder (GLYCOLAX/MIRALAX) powder Take 17 g by mouth  daily.  . potassium chloride (K-DUR) 10 MEQ tablet Take 1 tablet (10 mEq total) by mouth 2 (two) times daily as needed.  . Red Yeast Rice 600 MG CAPS Take 2 capsules by mouth daily.  . sitaGLIPtin (JANUVIA) 50 MG tablet Take 1 tablet (50 mg total) by mouth daily.  . traMADol (ULTRAM) 50 MG tablet Take 1 tablet (50 mg total) by mouth every 12 (twelve) hours as needed for moderate pain (for arthritis pain).   No current facility-administered medications on file prior to visit.    Review of Systems Per HPI unless specifically indicated above    Objective:    BP 122/72  Pulse 68  Temp(Src) 97.8 F (36.6 C) (Oral)  Ht 5'  11.25" (1.81 m)  Wt 366 lb 12 oz (166.357 kg)  BMI 50.78 kg/m2  SpO2 97%  Physical Exam  Nursing note and vitals reviewed. Constitutional: He is oriented to person, place, and time. He appears well-developed and well-nourished. No distress.  HENT:  Head: Normocephalic and atraumatic.  Right Ear: Hearing, tympanic membrane, external ear and ear canal normal.  Left Ear: Hearing, tympanic membrane, external ear and ear canal normal.  Nose: Nose normal.  Mouth/Throat: Uvula is midline, oropharynx is clear and moist and mucous membranes are normal. No oropharyngeal exudate, posterior oropharyngeal edema or posterior oropharyngeal erythema.  Eyes: Conjunctivae and EOM are normal. Pupils are equal, round, and reactive to light. No scleral icterus.  Neck: Normal range of motion. Neck supple. Carotid bruit is not present.  Cardiovascular: Normal rate, regular rhythm, normal heart sounds and intact distal pulses.   No murmur heard. Pulses:      Radial pulses are 2+ on the right side, and 2+ on the left side.  Distant heart sounds  Pulmonary/Chest: Effort normal and breath sounds normal. No respiratory distress. He has no wheezes. He has no rales.  Abdominal: Soft. Bowel sounds are normal. He exhibits no distension and no mass. There is no tenderness. There is no rebound and no guarding.  Genitourinary: Rectum normal. Rectal exam shows no external hemorrhoid, no internal hemorrhoid, no fissure, no mass, no tenderness and anal tone normal. Prostate is enlarged (20gm). Prostate is not tender.  Slight swelling R prostate lobe  Musculoskeletal: Normal range of motion. He exhibits edema (tr pedal).  Lymphadenopathy:    He has no cervical adenopathy.  Neurological: He is alert and oriented to person, place, and time.  CN grossly intact, station and gait intact 3/3 recall 5/5 serial 3s calculation  Skin: Skin is warm and dry. No rash noted.  Psychiatric: He has a normal mood and affect. His behavior is  normal. Judgment and thought content normal.   Results for orders placed in visit on 04/15/13  CBC WITH DIFFERENTIAL      Result Value Ref Range   WBC 5.5  4.5 - 10.5 K/uL   RBC 4.67  4.22 - 5.81 Mil/uL   Hemoglobin 12.6 (*) 13.0 - 17.0 g/dL   HCT 39.7  39.0 - 52.0 %   MCV 85.1  78.0 - 100.0 fl   MCHC 31.8  30.0 - 36.0 g/dL   RDW 14.5  11.5 - 14.6 %   Platelets 146.0 (*) 150.0 - 400.0 K/uL   Neutrophils Relative % 77.0  43.0 - 77.0 %   Lymphocytes Relative 12.0  12.0 - 46.0 %   Monocytes Relative 9.0  3.0 - 12.0 %   Eosinophils Relative 1.7  0.0 - 5.0 %   Basophils Relative 0.3  0.0 - 3.0 %   Neutro Abs 4.3  1.4 - 7.7 K/uL   Lymphs Abs 0.7  0.7 - 4.0 K/uL   Monocytes Absolute 0.5  0.1 - 1.0 K/uL   Eosinophils Absolute 0.1  0.0 - 0.7 K/uL   Basophils Absolute 0.0  0.0 - 0.1 K/uL  TSH      Result Value Ref Range   TSH 3.21  0.35 - 5.50 uIU/mL  BASIC METABOLIC PANEL      Result Value Ref Range   Sodium 140  135 - 145 mEq/L   Potassium 4.3  3.5 - 5.1 mEq/L   Chloride 103  96 - 112 mEq/L   CO2 26  19 - 32 mEq/L   Glucose, Bld 193 (*) 70 - 99 mg/dL   BUN 20  6 - 23 mg/dL   Creatinine, Ser 1.1  0.4 - 1.5 mg/dL   Calcium 9.4  8.4 - 10.5 mg/dL   GFR 75.41  >60.00 mL/min  PSA, MEDICARE      Result Value Ref Range   PSA 0.80  0.10 - 4.00 ng/ml      Assessment & Plan:   Problem List Items Addressed This Visit   Diabetes type 2, uncontrolled     Chronic. A1c elevated last visit.  Will return in 3 mo for DM f/u.    Diabetic gastroparesis     Stable on reglan 98m tid.    GERD (gastroesophageal reflux disease)     Chronic, severe with DM gastroparesis.  Currently stable period on nexium 47mbid and reglan 1045mid.  Insurance has declined nexium bid dosing.  Provided with #30 nexium 55m75mmples today.    HLD (hyperlipidemia)     Chronic, stable . Continue RYR    HTN (hypertension)     Chronic, stable. Continue med.s    Hypothyroid     Chronic, stable. Continue med.      Medicare annual wellness visit, subsequent - Primary     I have personally reviewed the Medicare Annual Wellness questionnaire and have noted 1. The patient's medical and social history 2. Their use of alcohol, tobacco or illicit drugs 3. Their current medications and supplements 4. The patient's functional ability including ADL's, fall risks, home safety risks and hearing or visual impairment. 5. Diet and physical activity 6. Evidence for depression or mood disorders The patients weight, height, BMI have been recorded in the chart.  Hearing and vision has been addressed. I have made referrals, counseling and provided education to the patient based review of the above and I have provided the pt with a written personalized care plan for preventive services. See scanned questionairre. Advanced directives discussed: packet provided x2.  Reviewed preventative protocols and updated unless pt declined.  UTD immnizations. PSA reassuring. Will continue to monitor R prostate lobe swelling.        Follow up plan: Return in about 3 months (around 07/27/2013), or as needed, for follow up.

## 2013-04-27 ENCOUNTER — Telehealth: Payer: Self-pay | Admitting: Family Medicine

## 2013-04-27 NOTE — Telephone Encounter (Signed)
Relevant patient education mailed to patient.  

## 2013-04-29 ENCOUNTER — Telehealth: Payer: Self-pay | Admitting: Family Medicine

## 2013-04-29 NOTE — Telephone Encounter (Signed)
Relevant patient education assigned to patient using Emmi. ° °

## 2013-05-05 NOTE — Telephone Encounter (Signed)
Letter to insurance written - plz print and mail to insurance company.  I've placed [employer] denial letter in Kim's box.

## 2013-05-06 ENCOUNTER — Other Ambulatory Visit: Payer: Self-pay

## 2013-05-06 MED ORDER — HYDROCODONE-ACETAMINOPHEN 10-325 MG PO TABS: 1.0000 | ORAL_TABLET | Freq: Three times a day (TID) | ORAL | Status: DC | PRN

## 2013-05-06 NOTE — Telephone Encounter (Signed)
Pt left v/m requesting rx hydrocodone apap. Call when ready for pick up.  

## 2013-05-06 NOTE — Telephone Encounter (Signed)
Printed and placed in Kim's box.

## 2013-05-06 NOTE — Telephone Encounter (Signed)
Message left notifying patient and Rx placed up front for pick up.

## 2013-05-07 NOTE — Telephone Encounter (Signed)
Letter faxed for expedited response.

## 2013-05-09 NOTE — Telephone Encounter (Signed)
Nexium approved.

## 2013-05-14 ENCOUNTER — Telehealth: Payer: Self-pay | Admitting: *Deleted

## 2013-05-14 NOTE — Telephone Encounter (Signed)
Received a fax from pharmacist that current guidelines from the CCA and AHA recommends moderate-intensity statin therapy for pt's with DM ages 33-75. Per pharmacy drug profile pt may benefit from the addition of a statin, and recommends simvastatin 49m or Atorvastatin 175m Response requested via fax. Form is in your inbox.

## 2013-05-17 ENCOUNTER — Telehealth: Payer: Self-pay | Admitting: Family Medicine

## 2013-05-17 ENCOUNTER — Ambulatory Visit (INDEPENDENT_AMBULATORY_CARE_PROVIDER_SITE_OTHER): Payer: Medicare HMO | Admitting: Family Medicine

## 2013-05-17 ENCOUNTER — Encounter: Payer: Self-pay | Admitting: Family Medicine

## 2013-05-17 ENCOUNTER — Ambulatory Visit (INDEPENDENT_AMBULATORY_CARE_PROVIDER_SITE_OTHER)
Admission: RE | Admit: 2013-05-17 | Discharge: 2013-05-17 | Disposition: A | Discharge status: Home / Self Care | Payer: Commercial Managed Care - HMO | Source: Ambulatory Visit | Attending: Family Medicine | Admitting: Family Medicine

## 2013-05-17 VITALS — BP 118/68 | HR 72 | Temp 98.1°F | Wt 367.0 lb

## 2013-05-17 DIAGNOSIS — R0600 Dyspnea, unspecified: Secondary | ICD-10-CM

## 2013-05-17 DIAGNOSIS — R0609 Other forms of dyspnea: Secondary | ICD-10-CM

## 2013-05-17 DIAGNOSIS — R0989 Other specified symptoms and signs involving the circulatory and respiratory systems: Secondary | ICD-10-CM

## 2013-05-17 MED ORDER — IPRATROPIUM BROMIDE 0.02 % IN SOLN
0.5000 mg | Freq: Once | RESPIRATORY_TRACT | Status: AC
  Administered 2013-05-17: 0.5 mg via RESPIRATORY_TRACT

## 2013-05-17 MED ORDER — ALBUTEROL SULFATE HFA 108 (90 BASE) MCG/ACT IN AERS: 2.0000 | INHALATION_SPRAY | Freq: Four times a day (QID) | RESPIRATORY_TRACT | Status: DC | PRN

## 2013-05-17 MED ORDER — ALBUTEROL SULFATE (2.5 MG/3ML) 0.083% IN NEBU
2.5000 mg | INHALATION_SOLUTION | Freq: Once | RESPIRATORY_TRACT | Status: AC
  Administered 2013-05-17: 2.5 mg via RESPIRATORY_TRACT

## 2013-05-17 NOTE — Progress Notes (Signed)
BP 118/68  Pulse 72  Temp(Src) 98.1 F (36.7 C) (Oral)  Wt 367 lb (166.47 kg)  SpO2 98%  PF 250 L/min   CC: dyspnea  Subjective:    Patient ID: Chad [name], male    DOB: [birth year], 58 y.o.   MRN: [id number]  HPI: Chad [name] is a 58 y.o. male presenting on 05/17/2013 for Shortness of Breath   Chad [name] has history of chronic diastolic dysfunction.  Weight overall stable.  Episodes of dyspnea ongoing over last month.  Episode described as sudden onset trouble catching breath that lasts for a few minutes.  Comes on all of a sudden, worse when sitting up or laying flat.  Sleeps in recliner.  Wakes him up at time.  Feels like something is over his face. Denies chest pain, leg swelling, HA.  ?h/o asthma. GERD sxs ongoing despite nexium 53m bid. Carafate didn't help.  Albuterol may have helped.  tussionex definitely helped. Denies significant anxiety but father is currently in the hospital. + h/o seasonal allergic rhinitis but does notice increased sneezing.  Wt Readings from Last 3 Encounters:  05/17/13 367 lb (166.47 kg)  04/26/13 366 lb 12 oz (166.357 kg)  02/20/13 368 lb 8 oz (167.151 kg)    Past Medical History  Diagnosis Date  . T2DM (type 2 diabetes mellitus) 1990s  . HTN (hypertension)   . HLD (hyperlipidemia)     statin caused leg cramps  . GERD (gastroesophageal reflux disease)     severe, h/o gastritis and GI bleed, [facility]  . Seasonal allergies   . Colon polyp 2008  . Hypothyroid   . Sensorineural hearing loss, bilateral     hearing aides  . Gastric bypass status for obesity 1985  . Morbid obesity   . Bulging lumbar disc   . Bone spur     L4 L5  . Narrowing of lumbar spine   . Right ear pain     s/p eval by ENT - thought TMJ referred pain and sent to oral surg for dental splint  . Diastolic CHF, chronic 20/35/5974 . Shortness of breath     "comes on at anytime lately" (01/25/2013)  . OSA (obstructive sleep apnea)     unable to  use CPAP as of last try 2/2 h/o tracheostomy?  .Marland KitchenArthritis     "both hips and knees; got shots in each hip in August" (01/25/2013)  . Trifascicular block  RBBB/LPFB/1AVB   . PVC (premature ventricular contraction)     RBBB Infer axix  . Tinnitus     due to sensorineural hearing loss R>L with ETD    Relevant past medical, surgical, family and social history reviewed and updated as indicated.  Allergies and medications reviewed and updated. Current Outpatient Prescriptions on File Prior to Visit  Medication Sig  . aspirin EC 81 MG tablet Take 162 mg by mouth daily.    .Marland Kitchendocusate sodium 100 MG CAPS Take 100 mg by mouth daily as needed for mild constipation.  .Marland Kitchenesomeprazole (NEXIUM) 40 MG capsule Take 1 capsule (40 mg total) by mouth 2 (two) times daily.  . Flaxseed, Linseed, (FLAX SEED OIL) 1000 MG CAPS Take 1 capsule by mouth 2 (two) times daily.    .Marland KitchenglipiZIDE (GLUCOTROL XL) 10 MG 24 hr tablet Take 1 tablet (10 mg total) by mouth daily with breakfast.  . hydrochlorothiazide (HYDRODIURIL) 25 MG tablet Take 25 mg by mouth daily.  .Marland KitchenHYDROcodone-acetaminophen (  NORCO) 10-325 MG per tablet Take 1 tablet by mouth 3 (three) times daily as needed for moderate pain.  Marland Kitchen levothyroxine (SYNTHROID, LEVOTHROID) 100 MCG tablet TAKE 1 TABLET BY MOUTH ONCE A DAY  . lisinopril (PRINIVIL,ZESTRIL) 40 MG tablet Take 40 mg by mouth daily.  . meclizine (ANTIVERT) 25 MG tablet Take 1 tablet (25 mg total) by mouth 2 (two) times daily as needed for dizziness or nausea.  . metFORMIN (GLUCOPHAGE) 1000 MG tablet Take 1,000-1,500 mg by mouth 2 (two) times daily with a meal. Pt takes 1000 mg in the morning and 1500 mg at night  . metoCLOPramide (REGLAN) 10 MG tablet Take 1 tablet (10 mg total) by mouth 3 (three) times daily before meals.  . metoprolol tartrate (LOPRESSOR) 25 MG tablet Take 12.5 mg by mouth 2 (two) times daily.  . Multiple Vitamin (MULITIVITAMIN WITH MINERALS) TABS Take 1 tablet by mouth daily.  .  nitroGLYCERIN (NITROSTAT) 0.4 MG SL tablet Place 1 tablet (0.4 mg total) under the tongue every 5 (five) minutes as needed for chest pain.  Marland Kitchen omega-3 acid ethyl esters (LOVAZA) 1 G capsule Take 2 g by mouth 2 (two) times daily.  . pioglitazone (ACTOS) 30 MG tablet Take 15 mg by mouth daily.  . polyethylene glycol powder (GLYCOLAX/MIRALAX) powder Take 17 g by mouth daily.  . potassium chloride (K-DUR) 10 MEQ tablet Take 1 tablet (10 mEq total) by mouth 2 (two) times daily as needed.  . Red Yeast Rice 600 MG CAPS Take 2 capsules by mouth daily.  . sitaGLIPtin (JANUVIA) 50 MG tablet Take 1 tablet (50 mg total) by mouth daily.  . traMADol (ULTRAM) 50 MG tablet Take 1 tablet (50 mg total) by mouth every 12 (twelve) hours as needed for moderate pain (for arthritis pain).   No current facility-administered medications on file prior to visit.    Review of Systems Per HPI unless specifically indicated above    Objective:    BP 118/68  Pulse 72  Temp(Src) 98.1 F (36.7 C) (Oral)  Wt 367 lb (166.47 kg)  SpO2 98%  PF 250 L/min  Physical Exam  Nursing note and vitals reviewed. Constitutional: He appears well-developed and well-nourished. No distress.  Morbidly obese  HENT:  Head: Normocephalic and atraumatic.  Nose: Mucosal edema present. No rhinorrhea. Right sinus exhibits no maxillary sinus tenderness and no frontal sinus tenderness. Left sinus exhibits no maxillary sinus tenderness and no frontal sinus tenderness.  Mouth/Throat: Uvula is midline, oropharynx is clear and moist and mucous membranes are normal. No oropharyngeal exudate, posterior oropharyngeal edema, posterior oropharyngeal erythema or tonsillar abscesses.  Pale boggy turbinates Cobblestoning post oropharynx  Eyes: Conjunctivae and EOM are normal. Pupils are equal, round, and reactive to light. No scleral icterus.  Neck: Normal range of motion. Neck supple. No JVD present.  Cardiovascular: Normal rate, regular rhythm, normal  heart sounds and intact distal pulses.   No murmur heard. Pulmonary/Chest: No accessory muscle usage. Not tachypneic. No respiratory distress. He has decreased breath sounds (distant). He has no wheezes. He has no rhonchi. He has no rales.  L mid lobe crackles  Musculoskeletal: He exhibits edema (nonpitting).  Lymphadenopathy:    He has no cervical adenopathy.  Skin: Skin is warm and dry. No rash noted.       Assessment & Plan:   Problem List Items Addressed This Visit   Dyspnea - Primary     He has several contributing factors to his dyspnea including history of chronic diastolic  CHF and morbid obesity with deconditioning.  However, this episode presents acutely.  He endorses history of asthma in the past, and has evidence of allergic rhinitis exacerbation on exam today.  I think he has RAD exacerbation in setting of allergic rhinitis.  I will treat with starting claritin daily and albuterol inhaler 2puffs Q6 hours prn dyspnea, discussed administration of inhaler.  Pt agrees with plan. Post albuterol/atrovent neb noted some improvement.  CXR today stable. Untreated OSA 2/2 unable to tolerate CPAP machine. ?OHVS however pulse ox today 98% RA Consider spirometry in future. PF today pre alb 250, post alb 310, and expected for height 515 --> postalb 60% expected, however not sure he had optimal effort with testing.    Relevant Medications      albuterol (PROVENTIL) (2.5 MG/3ML) 0.083% nebulizer solution 2.5 mg (Completed)      ipratropium (ATROVENT) nebulizer solution 0.5 mg (Completed)   Other Relevant Orders      DG Chest 2 View (Completed)       Follow up plan: Return as needed, for follow up.

## 2013-05-17 NOTE — Telephone Encounter (Signed)
Patient Information:  Caller Name: Chad [name]  Phone: [phone]  Patient: Chad [name], Chad [name]  Gender: Male  DOB: 03/21/1955  Age: 58 Years  PCP: [name])  Office Follow Up:  Does the office need to follow up with this patient?: Yes  Instructions For The Office: Office please review.  Pt is having SOB for the last couple of nights and sx are not bad at present but he is still have SOB.  Would like to come to [facility] location today.  Pt can not find his way to the other locations and he has no GPS.  Please call pt back regarding appt for today.  Instructed to go to ED or 911 if sx worsen. Please call pt regarding appt.  RN Note:  Pt would like to be seen today in the office  Symptoms  Reason For Call & Symptoms: Pt is calling and states that he is having SOB; sx started 05/15/2013; can not sleep laying down; feels like someone put a pillow over his face when he gets the attacks;  sleeping in the chair; SOB is not as bad at present time but when it hits him it gets bad; no new swelling; no chest pain  Reviewed Health History In EMR: Yes  Reviewed Medications In EMR: Yes  Reviewed Allergies In EMR: Yes  Reviewed Surgeries / Procedures: Yes  Date of Onset of Symptoms: 05/15/2013  Guideline(s) Used:  Breathing Difficulty  Disposition Per Guideline:   Go to Office Now  Reason For Disposition Reached:   Longstanding difficulty breathing (e.g., CHF, COPD, emphysema) and worse than normal  Advice Given:  Call Back If:  You become worse.  Patient Will Follow Care Advice:  YES

## 2013-05-17 NOTE — Telephone Encounter (Signed)
Appt scheduled

## 2013-05-17 NOTE — Progress Notes (Signed)
Pre visit review using our clinic review tool, if applicable. No additional management support is needed unless otherwise documented below in the visit note. 

## 2013-05-17 NOTE — Telephone Encounter (Signed)
plz schedule with me double book if no other providers can see today.

## 2013-05-17 NOTE — Patient Instructions (Signed)
I think this is from seasonal allergies and possible asthma treat with claritin daily and use albuterol inhaler if needed - if you feel short winded. Let me know if not better with this.

## 2013-05-18 NOTE — Telephone Encounter (Signed)
returned fax - intolerant of statins.  Ok on [employer].

## 2013-05-18 NOTE — Assessment & Plan Note (Signed)
He has several contributing factors to his dyspnea including history of chronic diastolic CHF and morbid obesity with deconditioning.  However, this episode presents acutely.  He endorses history of asthma in the past, and has evidence of allergic rhinitis exacerbation on exam today.  I think he has RAD exacerbation in setting of allergic rhinitis.  I will treat with starting claritin daily and albuterol inhaler 2puffs Q6 hours prn dyspnea, discussed administration of inhaler.  Pt agrees with plan. Post albuterol/atrovent neb noted some improvement.  CXR today stable. Untreated OSA 2/2 unable to tolerate CPAP machine. ?OHVS however pulse ox today 98% RA Consider spirometry in future. PF today pre alb 250, post alb 310, and expected for height 515 --> postalb 60% expected, however not sure he had optimal effort with testing.

## 2013-05-23 ENCOUNTER — Other Ambulatory Visit: Payer: Self-pay | Admitting: Family Medicine

## 2013-06-05 ENCOUNTER — Other Ambulatory Visit: Payer: Self-pay

## 2013-06-05 MED ORDER — HYDROCODONE-ACETAMINOPHEN 10-325 MG PO TABS: 1.0000 | ORAL_TABLET | Freq: Three times a day (TID) | ORAL | Status: DC | PRN

## 2013-06-05 NOTE — Telephone Encounter (Signed)
Px printed for pick up in IN box  

## 2013-06-05 NOTE — Telephone Encounter (Signed)
Pt left v/m requesting rx hydrocodone apap. Call when ready for pick up.  

## 2013-06-06 NOTE — Telephone Encounter (Signed)
Dr. Glori Bickers didn't sign Rx and is out of the office today  Called pt to see if he needs his Rx today or could he wait until Dr. Glori Bickers is back in the office tomorrow.  Called pt and no answer so left voicemail requesting pt to call office

## 2013-06-07 NOTE — Telephone Encounter (Signed)
Pt notified Rx ready for pickup 

## 2013-06-18 DIAGNOSIS — J398 Other specified diseases of upper respiratory tract: Secondary | ICD-10-CM | POA: Diagnosis not present

## 2013-06-18 DIAGNOSIS — K219 Gastro-esophageal reflux disease without esophagitis: Secondary | ICD-10-CM | POA: Diagnosis not present

## 2013-06-18 DIAGNOSIS — J301 Allergic rhinitis due to pollen: Secondary | ICD-10-CM | POA: Diagnosis not present

## 2013-06-18 DIAGNOSIS — J988 Other specified respiratory disorders: Secondary | ICD-10-CM | POA: Diagnosis not present

## 2013-06-18 DIAGNOSIS — J343 Hypertrophy of nasal turbinates: Secondary | ICD-10-CM | POA: Diagnosis not present

## 2013-06-21 ENCOUNTER — Other Ambulatory Visit: Payer: Self-pay | Admitting: Family Medicine

## 2013-07-01 ENCOUNTER — Telehealth: Payer: Self-pay

## 2013-07-01 NOTE — Telephone Encounter (Signed)
Spoke with patient. He said his sugar has been over 160 fasting every morning for quite sometime, so he has been taking 1 BID on his own. I advised him that he didn't tell us that at his last appointment or we would've changed his medication list. He said he forgot to mention it. Please advise on what you would like for him to do.

## 2013-07-01 NOTE — Telephone Encounter (Addendum)
As long as not having low sugars ok to take bid.  Med list changed. plz send new dose to pharmacy

## 2013-07-01 NOTE — Telephone Encounter (Signed)
Mrs Schunk left v/m; Glipizide was refilled with pt taking 1 tab daily. Mrs Netto said pt has been taking Glipizide one tab twice a day. Mrs Printup request cb. [place].

## 2013-07-01 NOTE — Telephone Encounter (Signed)
Let's change to glipizide 24m XL once daily in am.

## 2013-07-02 ENCOUNTER — Other Ambulatory Visit: Payer: Self-pay | Admitting: *Deleted

## 2013-07-02 MED ORDER — GLIPIZIDE ER 10 MG PO TB24: 10.0000 mg | ORAL_TABLET | Freq: Two times a day (BID) | ORAL | Status: DC

## 2013-07-02 NOTE — Telephone Encounter (Signed)
Patient notified and refill sent in.

## 2013-07-08 ENCOUNTER — Other Ambulatory Visit: Payer: Self-pay

## 2013-07-08 NOTE — Telephone Encounter (Signed)
Mrs Hausman left v/m requesting rx hydrocodone apap. Call when ready for pick up.

## 2013-07-09 MED ORDER — HYDROCODONE-ACETAMINOPHEN 10-325 MG PO TABS: 1.0000 | ORAL_TABLET | Freq: Three times a day (TID) | ORAL | Status: DC | PRN

## 2013-07-09 NOTE — Telephone Encounter (Signed)
printed and placed in Kim's box

## 2013-07-09 NOTE — Telephone Encounter (Signed)
Patient notified and Rx placed up front for pick up.

## 2013-07-17 ENCOUNTER — Other Ambulatory Visit: Payer: Self-pay | Admitting: Gastroenterology

## 2013-07-17 MED ORDER — METOCLOPRAMIDE HCL 10 MG PO TABS: 10.0000 mg | ORAL_TABLET | Freq: Three times a day (TID) | ORAL | Status: DC

## 2013-07-20 ENCOUNTER — Other Ambulatory Visit: Payer: Self-pay | Admitting: Family Medicine

## 2013-08-02 ENCOUNTER — Ambulatory Visit (INDEPENDENT_AMBULATORY_CARE_PROVIDER_SITE_OTHER): Payer: Medicare Other | Admitting: Family Medicine

## 2013-08-02 ENCOUNTER — Encounter: Payer: Self-pay | Admitting: Family Medicine

## 2013-08-02 VITALS — BP 128/76 | HR 68 | Temp 97.3°F | Wt 362.0 lb

## 2013-08-02 DIAGNOSIS — IMO0001 Reserved for inherently not codable concepts without codable children: Secondary | ICD-10-CM

## 2013-08-02 DIAGNOSIS — E1143 Type 2 diabetes mellitus with diabetic autonomic (poly)neuropathy: Secondary | ICD-10-CM

## 2013-08-02 DIAGNOSIS — E1149 Type 2 diabetes mellitus with other diabetic neurological complication: Secondary | ICD-10-CM | POA: Diagnosis not present

## 2013-08-02 DIAGNOSIS — IMO0002 Reserved for concepts with insufficient information to code with codable children: Secondary | ICD-10-CM

## 2013-08-02 DIAGNOSIS — K3184 Gastroparesis: Secondary | ICD-10-CM

## 2013-08-02 DIAGNOSIS — R609 Edema, unspecified: Secondary | ICD-10-CM | POA: Diagnosis not present

## 2013-08-02 DIAGNOSIS — K219 Gastro-esophageal reflux disease without esophagitis: Secondary | ICD-10-CM

## 2013-08-02 DIAGNOSIS — R600 Localized edema: Secondary | ICD-10-CM

## 2013-08-02 DIAGNOSIS — E1165 Type 2 diabetes mellitus with hyperglycemia: Secondary | ICD-10-CM

## 2013-08-02 LAB — BASIC METABOLIC PANEL
BUN: 19 mg/dL (ref 6–23)
CHLORIDE: 102 meq/L (ref 96–112)
CO2: 26 mEq/L (ref 19–32)
Calcium: 9.2 mg/dL (ref 8.4–10.5)
Creatinine, Ser: 1 mg/dL (ref 0.4–1.5)
GFR: 83.37 mL/min (ref >60.00)
GLUCOSE: 133 mg/dL — AB (ref 70–99)
POTASSIUM: 3.7 meq/L (ref 3.5–5.1)
Sodium: 136 mEq/L (ref 135–145)

## 2013-08-02 LAB — HEMOGLOBIN A1C: Hgb A1c MFr Bld: 7.9 % — ABNORMAL HIGH (ref 4.6–6.5)

## 2013-08-02 NOTE — Patient Instructions (Addendum)
Schedule eye doctor appointment as you're due. Gastroparesis diet handout provided today. Start walking daily. Join weight loss program. Blood work today - if A1c persistently elevated we will discuss insulin. Good to see you today, call us with questions.

## 2013-08-02 NOTE — Assessment & Plan Note (Signed)
Does not tolerate compression stockings

## 2013-08-02 NOTE — Progress Notes (Signed)
BP 128/76  Pulse 68  Temp(Src) 97.3 F (36.3 C) (Oral)  Wt 362 lb (164.202 kg)   CC: 3 mo f/u  Subjective:    Patient ID: Chad [name], male    DOB: Jul 13, 1955, 58 y.o.   MRN: [id number]  HPI: Chad [name] is a 58 y.o. male presenting on 08/02/2013 for Follow-up   Due for blood work today.  Recently lost one of their dogs who was diabetic.  Wt Readings from Last 3 Encounters:  08/02/13 362 lb (164.202 kg)  05/17/13 367 lb (166.47 kg)  04/26/13 366 lb 12 oz (166.357 kg)   Body mass index is 50.12 kg/(m^2).  DM - regularly does check sugars fasting once a day, this morning 130s.  Compliant with antihyperglycemic regimen which includes: glipizide 50m XR bid, actos 3105mdaily and januvia 5040maily as well as metformin 2500m61mtal daily.  Denies low sugars or hypoglycemic symptoms.  Denies paresthesias. Last diabetic eye exam DUE. Pneumovax: 08/2011.  H/o DM gastroparesis with severe GERD on nexium 40mg73m and reglan 10mg 90m No pyramidal symptoms. "every time I eat I get sick"  GERD on nexium, sucralfate and reglan 10mg t26m Morbid obesity - Having trouble walking 2/2 R leg pain but tries to walk some. May look into joining weight loss program. Lab Results  Component Value Date   CREATININE 1.1 04/15/2013   Lab Results  Component Value Date   HGBA1C 8.4* 01/25/2013    Relevant past medical, surgical, family and social history reviewed and updated as indicated.  Allergies and medications reviewed and updated. Current Outpatient Prescriptions on File Prior to Visit  Medication Sig  . albuterol (PROVENTIL HFA;VENTOLIN HFA) 108 (90 BASE) MCG/ACT inhaler Inhale 2 puffs into the lungs every 6 (six) hours as needed for wheezing or shortness of breath.  . aspirMarland Kitchenn EC 81 MG tablet Take 162 mg by mouth daily.    . docusMarland Kitchente sodium 100 MG CAPS Take 100 mg by mouth daily as needed for mild constipation.  . esomeMarland Kitchenrazole (NEXIUM) 40 MG capsule Take 1 capsule (40 mg total) by mouth 2  (two) times daily.  . Flaxseed, Linseed, (FLAX SEED OIL) 1000 MG CAPS Take 1 capsule by mouth 2 (two) times daily.    . furosemide (LASIX) 20 MG tablet Take 20 mg by mouth daily.  . glipiMarland KitchenIDE (GLUCOTROL XL) 10 MG 24 hr tablet Take 1 tablet (10 mg total) by mouth 2 (two) times daily.  . hydrochlorothiazide (HYDRODIURIL) 25 MG tablet TAKE 1 TABLET BY MOUTH ONCE A DAY  . HYDROcodone-acetaminophen (NORCO) 10-325 MG per tablet Take 1 tablet by mouth 3 (three) times daily as needed for moderate pain.  . levotMarland Kitchenyroxine (SYNTHROID, LEVOTHROID) 100 MCG tablet TAKE 1 TABLET BY MOUTH ONCE A DAY  . lisinopril (PRINIVIL,ZESTRIL) 40 MG tablet TAKE 1 TABLET BY MOUTH ONCE A DAY  . meclizine (ANTIVERT) 25 MG tablet Take 1 tablet (25 mg total) by mouth 2 (two) times daily as needed for dizziness or nausea.  . metFORMIN (GLUCOPHAGE) 1000 MG tablet Take 1,000-1,500 mg by mouth 2 (two) times daily with a meal. Pt takes 1000 mg in the morning and 1500 mg at night  . metoCLOPramide (REGLAN) 10 MG tablet Take 1 tablet (10 mg total) by mouth 3 (three) times daily before meals.  . metoprolol tartrate (LOPRESSOR) 25 MG tablet Take 12.5 mg by mouth 2 (two) times daily.  . Multiple Vitamin (MULITIVITAMIN WITH MINERALS) TABS Take 1 tablet by mouth daily.  .Marland Kitchen  nitroGLYCERIN (NITROSTAT) 0.4 MG SL tablet Place 1 tablet (0.4 mg total) under the tongue every 5 (five) minutes as needed for chest pain.  Marland Kitchen omega-3 acid ethyl esters (LOVAZA) 1 G capsule TAKE 2 CAPSULES BY MOUTH TWICE A DAY  . pioglitazone (ACTOS) 30 MG tablet Take 30 mg by mouth daily.   . polyethylene glycol powder (GLYCOLAX/MIRALAX) powder Take 17 g by mouth daily.  . potassium chloride (K-DUR) 10 MEQ tablet Take 1 tablet (10 mEq total) by mouth 2 (two) times daily as needed.  . Red Yeast Rice 600 MG CAPS Take 2 capsules by mouth daily.  . sitaGLIPtin (JANUVIA) 50 MG tablet Take 1 tablet (50 mg total) by mouth daily.  . sucralfate (CARAFATE) 1 G tablet Take 1 g by  mouth 4 (four) times daily as needed.  . traMADol (ULTRAM) 50 MG tablet Take 1 tablet (50 mg total) by mouth every 12 (twelve) hours as needed for moderate pain (for arthritis pain).   No current facility-administered medications on file prior to visit.    Review of Systems Per HPI unless specifically indicated above    Objective:    BP 128/76  Pulse 68  Temp(Src) 97.3 F (36.3 C) (Oral)  Wt 362 lb (164.202 kg)  Physical Exam  Nursing note and vitals reviewed. Constitutional: He appears well-developed and well-nourished. No distress.  Morbid obesity  HENT:  Head: Normocephalic and atraumatic.  Right Ear: External ear normal.  Left Ear: External ear normal.  Nose: Nose normal.  Mouth/Throat: Oropharynx is clear and moist. No oropharyngeal exudate.  Eyes: Conjunctivae and EOM are normal. Pupils are equal, round, and reactive to light. No scleral icterus.  Neck: Normal range of motion. Neck supple.  Cardiovascular: Normal rate, regular rhythm, normal heart sounds and intact distal pulses.   No murmur heard. Pulmonary/Chest: Effort normal and breath sounds normal. No respiratory distress. He has no wheezes. He has no rales.  Musculoskeletal: He exhibits edema (chronic mild nonpitting).  Very tender anterior legs  Diabetic foot exam: Normal inspection No skin breakdown No calluses  Normal DP/PT pulses Normal sensation to light touch and monofilament Nails normal  Lymphadenopathy:    He has no cervical adenopathy.  Skin: Skin is warm and dry. No rash noted.  Psychiatric: He has a normal mood and affect.       Assessment & Plan:   Problem List Items Addressed This Visit   Pedal edema     Does not tolerate compression stockings    Morbid obesity     Encouraged rejoin weight loss program - states he previously lost 70 lbs.    GERD (gastroesophageal reflux disease)     Chronic, severe - continue nexium 55m bid, sucralfate and reglan.    Diabetic gastroparesis      Reviewed dx with patient. Continue reglan 132mtid.  Denies pyramidal sxs. Provided with gastroparesis diet handout.    Diabetes type 2, uncontrolled - Primary     Endorses improved numbers last few weeks. Check A1c today. If persistently >8%, discussed starting insulin.    Relevant Orders      Basic metabolic panel      Hemoglobin A1c      HM DIABETES FOOT EXAM (Completed)       Follow up plan: Return in about 3 months (around 11/02/2013), or as needed, for follow up visit.

## 2013-08-02 NOTE — Assessment & Plan Note (Signed)
Endorses improved numbers last few weeks. Check A1c today. If persistently >8%, discussed starting insulin.

## 2013-08-02 NOTE — Assessment & Plan Note (Signed)
Reviewed dx with patient. Continue reglan 21m tid.  Denies pyramidal sxs. Provided with gastroparesis diet handout.

## 2013-08-02 NOTE — Progress Notes (Signed)
Pre visit review using our clinic review tool, if applicable. No additional management support is needed unless otherwise documented below in the visit note. 

## 2013-08-02 NOTE — Assessment & Plan Note (Signed)
Encouraged rejoin weight loss program - states he previously lost 70 lbs.

## 2013-08-02 NOTE — Assessment & Plan Note (Signed)
Chronic, severe - continue nexium 38m bid, sucralfate and reglan.

## 2013-08-03 ENCOUNTER — Other Ambulatory Visit: Payer: Self-pay | Admitting: Family Medicine

## 2013-08-03 MED ORDER — INSULIN PEN NEEDLE 31G X 8 MM MISC: 1.0000 [IU] | Status: DC

## 2013-08-03 MED ORDER — INSULIN GLARGINE 100 UNIT/ML SOLOSTAR PEN: 10.0000 [IU] | PEN_INJECTOR | Freq: Every day | SUBCUTANEOUS | Status: DC

## 2013-08-07 ENCOUNTER — Other Ambulatory Visit: Payer: Self-pay

## 2013-08-07 NOTE — Telephone Encounter (Signed)
Mrs Chery left v/m requesting rx hydrocodone apap. Call when ready for pick up.

## 2013-08-08 MED ORDER — HYDROCODONE-ACETAMINOPHEN 10-325 MG PO TABS: 1.0000 | ORAL_TABLET | Freq: Three times a day (TID) | ORAL | Status: DC | PRN

## 2013-08-08 NOTE — Telephone Encounter (Signed)
Printed and placed in [place]' box

## 2013-08-08 NOTE — Telephone Encounter (Signed)
Message left notifying patient and Rx placed up front for pick up.

## 2013-08-19 ENCOUNTER — Telehealth: Payer: Self-pay | Admitting: Internal Medicine

## 2013-08-19 NOTE — Telephone Encounter (Signed)
Pt c/o abdominal pain in his lower abdomen especially after he eats. Pt requesting to be seen sooner than 1st available. Pt scheduled to see Amy Esterwood PA 08/21/13@2 :30pm. Pt aware of appt.

## 2013-08-21 ENCOUNTER — Encounter: Payer: Self-pay | Admitting: Physician Assistant

## 2013-08-21 ENCOUNTER — Ambulatory Visit (INDEPENDENT_AMBULATORY_CARE_PROVIDER_SITE_OTHER): Payer: Medicare Other | Admitting: Physician Assistant

## 2013-08-21 ENCOUNTER — Other Ambulatory Visit (INDEPENDENT_AMBULATORY_CARE_PROVIDER_SITE_OTHER): Payer: Medicare Other

## 2013-08-21 VITALS — BP 104/54 | HR 80 | Ht 70.0 in | Wt 358.4 lb

## 2013-08-21 DIAGNOSIS — R109 Unspecified abdominal pain: Secondary | ICD-10-CM

## 2013-08-21 DIAGNOSIS — R112 Nausea with vomiting, unspecified: Secondary | ICD-10-CM

## 2013-08-21 DIAGNOSIS — R197 Diarrhea, unspecified: Secondary | ICD-10-CM | POA: Diagnosis not present

## 2013-08-21 DIAGNOSIS — Z9884 Bariatric surgery status: Secondary | ICD-10-CM | POA: Diagnosis not present

## 2013-08-21 LAB — COMPREHENSIVE METABOLIC PANEL
ALT: 56 U/L — ABNORMAL HIGH (ref 0–53)
AST: 77 U/L — ABNORMAL HIGH (ref 0–37)
Albumin: 4 g/dL (ref 3.5–5.2)
Alkaline Phosphatase: 114 U/L (ref 39–117)
BUN: 16 mg/dL (ref 6–23)
CHLORIDE: 98 meq/L (ref 96–112)
CO2: 24 meq/L (ref 19–32)
CREATININE: 1.2 mg/dL (ref 0.4–1.5)
Calcium: 9.5 mg/dL (ref 8.4–10.5)
GFR: 69.3 mL/min (ref >60.00)
GLUCOSE: 182 mg/dL — AB (ref 70–99)
Potassium: 3.9 mEq/L (ref 3.5–5.1)
Sodium: 133 mEq/L — ABNORMAL LOW (ref 135–145)
Total Bilirubin: 1 mg/dL (ref 0.2–1.2)
Total Protein: 7 g/dL (ref 6.0–8.3)

## 2013-08-21 LAB — CBC WITH DIFFERENTIAL/PLATELET
BASOS PCT: 0.3 % (ref 0.0–3.0)
Basophils Absolute: 0 10*3/uL (ref 0.0–0.1)
EOS PCT: 1.9 % (ref 0.0–5.0)
Eosinophils Absolute: 0.1 10*3/uL (ref 0.0–0.7)
HCT: 40.2 % (ref 39.0–52.0)
Hemoglobin: 13.2 g/dL (ref 13.0–17.0)
LYMPHS ABS: 0.9 10*3/uL (ref 0.7–4.0)
LYMPHS PCT: 12.4 % (ref 12.0–46.0)
MCHC: 32.9 g/dL (ref 30.0–36.0)
MCV: 82.8 fl (ref 78.0–100.0)
MONOS PCT: 9.5 % (ref 3.0–12.0)
Monocytes Absolute: 0.7 10*3/uL (ref 0.1–1.0)
Neutro Abs: 5.3 10*3/uL (ref 1.4–7.7)
Neutrophils Relative %: 75.9 % (ref 43.0–77.0)
Platelets: 198 10*3/uL (ref 150.0–400.0)
RBC: 4.86 Mil/uL (ref 4.22–5.81)
RDW: 14.8 % (ref 11.5–15.5)
WBC: 6.9 10*3/uL (ref 4.0–10.5)

## 2013-08-21 LAB — LIPASE: LIPASE: 47 U/L (ref 11.0–59.0)

## 2013-08-21 NOTE — Progress Notes (Addendum)
Subjective:    Patient ID: Chad [name], male    DOB: [birth year], 58 y.o.   MRN: [id number]  HPI Dewayne is a pleasant 58 year old white male known to Dr. Hilarie Fredrickson. He has history of morbid obesity, status post remote gastric stapling, history of chronic GERD, status post cholecystectomy, history of gastroparesis and insulin-dependent diabetes mellitus. He also has sleep apnea and coronary artery disease. Patient had colonoscopy done in 2008 per Dr. Kristie Cowman which showed multiple sigmoid and left colon diverticuli was noted to have one polyp which was hyperplastic. He was seen by Dr. Hilarie Fredrickson in December of 2014 at that time was having difficulty with gastroparesis symptoms and GERD. His metoclopramide was increased to 3 times daily and was continued on PPI twice daily. He comes in today stating that over the past 2 weeks he's been having problems with mid and lower abdominal pain and cramping associated with nausea and intermittent vomiting. He is also having periodic diarrhea. He says sometimes stools start out formed and and he'll have several bowel movements eventually becoming looser. He says his stomach hurts everyday no matter what he eats  how much he eats -he feels nauseated after eating. He says his reflux symptoms have been worse recently as well. No fever or chills. No melena or hematochezia. He has not been on any new medications or antibiotics. He has been taking the PPI twice daily, usually taking Carafate twice daily and continues on Reglan 10 mg a.c.    Review of Systems  HENT: Negative.   Eyes: Negative.   Respiratory: Negative.   Cardiovascular: Negative.   Gastrointestinal: Positive for nausea, vomiting, abdominal pain and diarrhea.  Endocrine: Negative.   Musculoskeletal: Negative.   Skin: Negative.   Allergic/Immunologic: Negative.   Neurological: Negative.   Hematological: Negative.   Psychiatric/Behavioral: Negative.    Outpatient Prescriptions Prior to Visit    Medication Sig Dispense Refill  . albuterol (PROVENTIL HFA;VENTOLIN HFA) 108 (90 BASE) MCG/ACT inhaler Inhale 2 puffs into the lungs every 6 (six) hours as needed for wheezing or shortness of breath.  1 Inhaler  3  . aspirin EC 81 MG tablet Take 162 mg by mouth daily.        Marland Kitchen docusate sodium 100 MG CAPS Take 100 mg by mouth daily as needed for mild constipation.  30 capsule  0  . esomeprazole (NEXIUM) 40 MG capsule Take 1 capsule (40 mg total) by mouth 2 (two) times daily.  60 capsule  3  . Flaxseed, Linseed, (FLAX SEED OIL) 1000 MG CAPS Take 1 capsule by mouth 2 (two) times daily.        . furosemide (LASIX) 20 MG tablet Take 20 mg by mouth daily.      Marland Kitchen glipiZIDE (GLUCOTROL XL) 10 MG 24 hr tablet Take 1 tablet (10 mg total) by mouth 2 (two) times daily.  60 tablet  3  . hydrochlorothiazide (HYDRODIURIL) 25 MG tablet TAKE 1 TABLET BY MOUTH ONCE A DAY  30 tablet  3  . HYDROcodone-acetaminophen (NORCO) 10-325 MG per tablet Take 1 tablet by mouth 3 (three) times daily as needed for moderate pain.  60 tablet  0  . Insulin Glargine (LANTUS SOLOSTAR) 100 UNIT/ML Solostar Pen Inject 10 Units into the skin daily at 10 pm.  3 pen  3  . Insulin Pen Needle 31G X 8 MM MISC 1 Units by Does not apply route as directed.  100 each  3  . levothyroxine (SYNTHROID, LEVOTHROID) 100 MCG  tablet TAKE 1 TABLET BY MOUTH ONCE A DAY  30 tablet  11  . lisinopril (PRINIVIL,ZESTRIL) 40 MG tablet TAKE 1 TABLET BY MOUTH ONCE A DAY  30 tablet  3  . meclizine (ANTIVERT) 25 MG tablet Take 1 tablet (25 mg total) by mouth 2 (two) times daily as needed for dizziness or nausea.  30 tablet  0  . metFORMIN (GLUCOPHAGE) 1000 MG tablet Take 1,000-1,500 mg by mouth 2 (two) times daily with a meal. Pt takes 1000 mg in the morning and 1500 mg at night      . metoCLOPramide (REGLAN) 10 MG tablet Take 1 tablet (10 mg total) by mouth 3 (three) times daily before meals.  90 tablet  2  . metoprolol tartrate (LOPRESSOR) 25 MG tablet Take 12.5 mg  by mouth 2 (two) times daily.      . Multiple Vitamin (MULITIVITAMIN WITH MINERALS) TABS Take 1 tablet by mouth daily.      . nitroGLYCERIN (NITROSTAT) 0.4 MG SL tablet Place 1 tablet (0.4 mg total) under the tongue every 5 (five) minutes as needed for chest pain.  60 tablet  3  . omega-3 acid ethyl esters (LOVAZA) 1 G capsule TAKE 2 CAPSULES BY MOUTH TWICE A DAY  120 capsule  11  . pioglitazone (ACTOS) 30 MG tablet Take 30 mg by mouth daily.       . polyethylene glycol powder (GLYCOLAX/MIRALAX) powder Take 17 g by mouth daily.  255 g  3  . potassium chloride (K-DUR) 10 MEQ tablet Take 1 tablet (10 mEq total) by mouth 2 (two) times daily as needed.  180 tablet  3  . Red Yeast Rice 600 MG CAPS Take 2 capsules by mouth daily.      . sitaGLIPtin (JANUVIA) 50 MG tablet Take 1 tablet (50 mg total) by mouth daily.  30 tablet  6  . sucralfate (CARAFATE) 1 G tablet Take 1 g by mouth 4 (four) times daily as needed.      . traMADol (ULTRAM) 50 MG tablet Take 1 tablet (50 mg total) by mouth every 12 (twelve) hours as needed for moderate pain (for arthritis pain).  30 tablet  1   No facility-administered medications prior to visit.   Allergies  Allergen Reactions  . Codeine Nausea Only  . Nsaids Other (See Comments)    Acid Reflux and palpitations  . Statins Other (See Comments)    Bad leg cramps  . Sulfa Drugs Cross Reactors Nausea Only   Patient Active Problem List   Diagnosis Date Noted  . History of gastric stapling 08/21/2013  . Diabetic gastroparesis 04/14/2013  . Bradycardia 01/04/2013  . Venous ulcer 09/27/2012  . Diastolic CHF, chronic 16/02/930  . Pedal edema 03/01/2012  . Chest pain 10/06/2011  . 1St degree AV block 10/06/2011  . Otomycosis of right ear 07/06/2011  . Right knee pain 06/07/2011  . Medicare annual wellness visit, subsequent 03/08/2011  . Arthritis 01/31/2011  . OSA (obstructive sleep apnea) 01/14/2011  . Morbid obesity 01/14/2011  . CAD (coronary artery disease)  01/14/2011  . Irregular heart beat 12/28/2010  . Dyspnea 12/28/2010  . Diabetes type 2, uncontrolled   . HTN (hypertension)   . HLD (hyperlipidemia)   . GERD (gastroesophageal reflux disease)   . Seasonal allergies   . Hypothyroid   . Colon polyps    History  Substance Use Topics  . Smoking status: Never Smoker   . Smokeless tobacco: Never Used  . Alcohol Use: No  family history includes Alzheimer's disease in his maternal grandfather; Cancer in his father and paternal aunt; Coronary artery disease in his paternal uncle; Coronary artery disease (age of onset: 44) in his brother; Diabetes in his brother and mother; Hypertension in his brother and mother; Stroke in his brother.     Objective:   Physical Exam  well-developed morbidly obese white male in no acute distress, pleasant accompanied by his wife blood pressure 104/54 pulse 80 height 5 foot 10 weight 358., BMI 51 HEENT; nontraumatic normocephalic EOMI PERRLA sclera anicteric, Supple; no JVD, Cardiovascular; regular rate and rhythm with S1-S2 no murmur or gallop, Pulmonary some decreased breath sounds bilaterally, Abdomen ;morbidly obese bowel sounds are present but hypoactive he is tender in the left upper and left lower quadrant and in the epigastrium no definite guarding or rebound no palpable mass or hepatosplenomegaly, Rectal; exam not done, Extremities; no clubbing cyanosis or edema skin warm dry, Psych; mood and affect appropriate        Assessment & Plan:  #78 58 year old male with insulin-dependent diabetes mellitus and morbid obesity with chronic gastroparesis and GERD, status post cholecystectomy now presenting with two-week history of mid abdominal pain cramping nausea intermittent vomiting of unclear etiology. He does have previously documented diverticulosis but am not convinced that his symptoms are secondary to diverticulitis. He is also is status post remote gastric stapling #2 sleep apnea #3 coronary artery  disease #4 history of hyperplastic colon polyps  Plan; start Zofran 4 mg every 6 hours as needed for nausea Continue Nexium 40 mg by mouth twice daily Increase Carafate 1 g between meals and at bedtime CBC with differential, Cmet,and lipase  Schedule for CT scan of the abdomen and pelvis tomorrow-further plans  pending results of above  .  Addendum: Reviewed and agree with initial management. Jerene Bears, MD

## 2013-08-21 NOTE — Patient Instructions (Addendum)
Please go to the basement level to have your labs drawn.  We sent a prescription for Zofran for nausea to [place]. Continue the Nexium twice daily. Take the Carafate  1 gram between meals and at bedtime.   You have been scheduled for a CT scan of the abdomen and pelvis at [facility] ([address]---this is in the same building as [facility]).   You are scheduled on THursday 7-2 at 8:30 am. You should arrive at 8:15 minutes prior to your appointment time for registration. Please follow the written instructions below on the day of your exam:  WARNING: IF YOU ARE ALLERGIC TO IODINE/X-RAY DYE, PLEASE NOTIFY RADIOLOGY IMMEDIATELY AT [phone]! YOU WILL BE GIVEN A 13 HOUR PREMEDICATION PREP.  1) Do not eat or drink anything after 4:30 am (4 hours prior to your test)  Take the zofran about 30 min before you have to drink the contrast. 2) You have been given 2 bottles of oral contrast to drink. The solution may taste better if refrigerated, but do NOT add ice or any other liquid to this solution. Shake  well before drinking.    Drink 1 bottle of contrast @ 6:30 am  (2 hours prior to your exam)  Drink 1 bottle of contrast @ 7:30 am  (1 hour prior to your exam)  You may take any medications as prescribed with a small amount of water except for the following: Metformin, Glucophage, Glucovance, Avandamet, Riomet, Fortamet, Actoplus Met, Janumet, Glumetza or Metaglip. The above medications must be held the day of the exam AND 48 hours after the exam.  The purpose of you drinking the oral contrast is to aid in the visualization of your intestinal tract. The contrast solution may cause some diarrhea. Before your exam is started, you will be given a small amount of fluid to drink. Depending on your individual set of symptoms, you may also receive an intravenous injection of x-ray contrast/dye. Plan on being at [facility] for 30 minutes or long, depending on the  type of exam you are having performed.  If you have any questions regarding your exam or if you need to reschedule, you may call the CT department at [phone] between the hours of 8:00 am and 5:00 pm, Monday-Friday.  ________________________________________________________________________

## 2013-08-22 ENCOUNTER — Ambulatory Visit (INDEPENDENT_AMBULATORY_CARE_PROVIDER_SITE_OTHER)
Admission: RE | Admit: 2013-08-22 | Discharge: 2013-08-22 | Disposition: A | Discharge status: Home / Self Care | Payer: Medicare Other | Source: Ambulatory Visit | Attending: Physician Assistant | Admitting: Physician Assistant

## 2013-08-22 ENCOUNTER — Other Ambulatory Visit: Payer: Self-pay | Admitting: *Deleted

## 2013-08-22 ENCOUNTER — Telehealth: Payer: Self-pay | Admitting: Physician Assistant

## 2013-08-22 DIAGNOSIS — R112 Nausea with vomiting, unspecified: Secondary | ICD-10-CM | POA: Diagnosis not present

## 2013-08-22 DIAGNOSIS — R109 Unspecified abdominal pain: Secondary | ICD-10-CM | POA: Diagnosis not present

## 2013-08-22 DIAGNOSIS — D3500 Benign neoplasm of unspecified adrenal gland: Secondary | ICD-10-CM | POA: Diagnosis not present

## 2013-08-22 DIAGNOSIS — R197 Diarrhea, unspecified: Secondary | ICD-10-CM | POA: Diagnosis not present

## 2013-08-22 MED ORDER — ONDANSETRON HCL 4 MG PO TABS: 4.0000 mg | ORAL_TABLET | Freq: Three times a day (TID) | ORAL | Status: DC | PRN

## 2013-08-22 MED ORDER — ONDANSETRON HCL 4 MG PO TABS: ORAL_TABLET | ORAL | Status: DC

## 2013-08-22 MED ORDER — IOHEXOL 300 MG/ML  SOLN
100.0000 mL | Freq: Once | INTRAMUSCULAR | Status: AC | PRN
  Administered 2013-08-22: 100 mL via INTRAVENOUS

## 2013-08-22 MED ORDER — METRONIDAZOLE 250 MG PO TABS: 250.0000 mg | ORAL_TABLET | Freq: Four times a day (QID) | ORAL | Status: DC

## 2013-08-26 ENCOUNTER — Other Ambulatory Visit: Payer: Self-pay | Admitting: Family Medicine

## 2013-08-26 ENCOUNTER — Telehealth: Payer: Self-pay | Admitting: Family Medicine

## 2013-08-26 NOTE — Telephone Encounter (Signed)
Pt came in today stating [facility]. And the nurse told him he needed a letter stating that he doesn't need nitroGlycerin 0.73m sl tablet before they will him any medication Pt would like this letter asap

## 2013-08-26 NOTE — Telephone Encounter (Signed)
Please refer to msg below--advise if okay to do letter

## 2013-08-26 NOTE — Telephone Encounter (Signed)
Last filled 01/16/13 with 3 refills-- please advise if okay to refill

## 2013-08-27 ENCOUNTER — Encounter: Payer: Self-pay | Admitting: Family Medicine

## 2013-08-27 NOTE — Telephone Encounter (Signed)
Pt called 9:20 to see if letter was ready. Pt informed letter ready for pick up.

## 2013-08-27 NOTE — Telephone Encounter (Signed)
Placed up front for pickup ° °

## 2013-08-27 NOTE — Telephone Encounter (Signed)
Letter written and placed in Chad [name]'s box.

## 2013-08-30 ENCOUNTER — Other Ambulatory Visit: Payer: Self-pay | Admitting: Family Medicine

## 2013-08-30 NOTE — Telephone Encounter (Signed)
Rx called in as directed.   

## 2013-08-30 NOTE — Telephone Encounter (Signed)
plz phone in. 

## 2013-08-30 NOTE — Telephone Encounter (Signed)
Ok to refill 

## 2013-09-04 ENCOUNTER — Other Ambulatory Visit: Payer: Self-pay

## 2013-09-04 NOTE — Telephone Encounter (Signed)
Pt's wife left v/m requesting rx hydrocodone apap. Call when ready for pick up.

## 2013-09-05 MED ORDER — HYDROCODONE-ACETAMINOPHEN 10-325 MG PO TABS: 1.0000 | ORAL_TABLET | Freq: Three times a day (TID) | ORAL | Status: DC | PRN

## 2013-09-05 NOTE — Telephone Encounter (Signed)
Message left advising patient and Rx placed up front for pick up.

## 2013-09-05 NOTE — Telephone Encounter (Signed)
Printed and placed in Kim's box.

## 2013-09-09 ENCOUNTER — Other Ambulatory Visit: Payer: Self-pay | Admitting: *Deleted

## 2013-09-09 MED ORDER — ESOMEPRAZOLE MAGNESIUM 40 MG PO CPDR: 40.0000 mg | DELAYED_RELEASE_CAPSULE | Freq: Two times a day (BID) | ORAL | Status: DC

## 2013-09-11 NOTE — Telephone Encounter (Signed)
Pt left note; pt received letter about pts condition to assist pt in getting a diet pill. The other doctors office would not prescribe a diet pill due to pt's heart condition and pt wants to know if Dr Danise Mina will prescribe pt a diet pill that would be covered by medicare. Pt last seen 08/02/13.Pt request cb.

## 2013-09-13 NOTE — Telephone Encounter (Signed)
Patient advised.

## 2013-09-13 NOTE — Telephone Encounter (Signed)
I don't recommend a diet pill for him at this time as several have cardiovascular risk associated with them. We can further discuss at next appointment.

## 2013-09-20 ENCOUNTER — Other Ambulatory Visit: Payer: Self-pay | Admitting: Family Medicine

## 2013-09-21 DIAGNOSIS — R892 Abnormal level of other drugs, medicaments and biological substances in specimens from other organs, systems and tissues: Secondary | ICD-10-CM | POA: Insufficient documentation

## 2013-09-23 ENCOUNTER — Other Ambulatory Visit: Payer: Self-pay | Admitting: *Deleted

## 2013-09-23 MED ORDER — METOCLOPRAMIDE HCL 10 MG PO TABS: 10.0000 mg | ORAL_TABLET | Freq: Three times a day (TID) | ORAL | Status: DC

## 2013-09-25 DIAGNOSIS — M76899 Other specified enthesopathies of unspecified lower limb, excluding foot: Secondary | ICD-10-CM | POA: Diagnosis not present

## 2013-10-07 ENCOUNTER — Other Ambulatory Visit: Payer: Self-pay

## 2013-10-07 MED ORDER — HYDROCODONE-ACETAMINOPHEN 10-325 MG PO TABS: 1.0000 | ORAL_TABLET | Freq: Three times a day (TID) | ORAL | Status: DC | PRN

## 2013-10-07 NOTE — Telephone Encounter (Signed)
Pt called checking on status of hydrocodone apap rx; pt is out of med and request to pick up rx today.Please advise.

## 2013-10-07 NOTE — Telephone Encounter (Signed)
Printed and handed to [employer]

## 2013-10-07 NOTE — Telephone Encounter (Signed)
Left v/m notifying pt rx ready for pick up at front desk.

## 2013-10-07 NOTE — Telephone Encounter (Signed)
Pt's wife left v/m requesting rx hydrocodone apap. Call when ready for pick up.

## 2013-10-09 DIAGNOSIS — M76899 Other specified enthesopathies of unspecified lower limb, excluding foot: Secondary | ICD-10-CM | POA: Diagnosis not present

## 2013-10-11 ENCOUNTER — Encounter: Payer: Self-pay | Admitting: Family Medicine

## 2013-10-11 ENCOUNTER — Ambulatory Visit (INDEPENDENT_AMBULATORY_CARE_PROVIDER_SITE_OTHER): Payer: Medicare Other | Admitting: Family Medicine

## 2013-10-11 ENCOUNTER — Encounter (INDEPENDENT_AMBULATORY_CARE_PROVIDER_SITE_OTHER): Payer: Self-pay

## 2013-10-11 VITALS — BP 132/84 | HR 68 | Temp 97.6°F | Wt 343.8 lb

## 2013-10-11 DIAGNOSIS — H2510 Age-related nuclear cataract, unspecified eye: Secondary | ICD-10-CM | POA: Diagnosis not present

## 2013-10-11 DIAGNOSIS — IMO0001 Reserved for inherently not codable concepts without codable children: Secondary | ICD-10-CM

## 2013-10-11 DIAGNOSIS — E1165 Type 2 diabetes mellitus with hyperglycemia: Secondary | ICD-10-CM

## 2013-10-11 DIAGNOSIS — E11329 Type 2 diabetes mellitus with mild nonproliferative diabetic retinopathy without macular edema: Secondary | ICD-10-CM | POA: Diagnosis not present

## 2013-10-11 DIAGNOSIS — H524 Presbyopia: Secondary | ICD-10-CM | POA: Diagnosis not present

## 2013-10-11 DIAGNOSIS — IMO0002 Reserved for concepts with insufficient information to code with codable children: Secondary | ICD-10-CM

## 2013-10-11 DIAGNOSIS — E1139 Type 2 diabetes mellitus with other diabetic ophthalmic complication: Secondary | ICD-10-CM | POA: Diagnosis not present

## 2013-10-11 LAB — HM DIABETES EYE EXAM

## 2013-10-11 NOTE — Assessment & Plan Note (Signed)
Congratulated on increased activity leading to 20lb weight loss in last 2 months. States previously lost 70lbs. Wt Readings from Last 3 Encounters:  10/11/13 343 lb 12 oz (155.924 kg)  08/21/13 358 lb 6 oz (162.558 kg)  08/02/13 362 lb (164.202 kg)

## 2013-10-11 NOTE — Assessment & Plan Note (Signed)
Now with hypoglycemic episodes attributed to increased activity. Will stop glipizide XL 31m bid and actos 356mdaily. Will hold lantus for now with option to restart if sugars trending up again. Discussed monitoring sugars, continue januvia 5042maily and decrease metformin to 1000m41md. F/u with me at previously scheduled appt 10/2013 Pt agrees with plan. Reviewed hypoglycemic plan.

## 2013-10-11 NOTE — Patient Instructions (Addendum)
Congratulations with 20lb weight loss! Your sugars are dropping because you are more active. Let's stop glipizide XL and actos. Hold lantus for now , unless you see sugars rising again. Decrease metformin to 1043m twice daily. Keep appointment to see me Let me know if sugars staying low despite above.

## 2013-10-11 NOTE — Progress Notes (Signed)
BP 132/84  Pulse 68  Temp(Src) 97.6 F (36.4 C) (Oral)  Wt 343 lb 12 oz (155.924 kg)   CC: hypoglycemia  Subjective:    Patient ID: Chad [name], male    DOB: 09/04/1955, 58 y.o.   MRN: [id number]  HPI: Chad [name] is a 58 y.o. male presenting on 10/11/2013 for Hypoglycemia   Noticing low sugars over last 2 weeks - below 70s. + hypoglycemic sxs with this. Drinks OJ or eats banana and sugars improve.  Fasting this morning 81.  Was on actos 38m ,januvia 570m metfomrin 100053mn am and 1500 in pm, and lantus 10u, glipizide xl 68m9md.  Self stopped actos and lantus last 3 days and still has noticed low sugars. Lab Results  Component Value Date   HGBA1C 7.9* 08/02/2013   Lab Results  Component Value Date   CREATININE 1.2 08/21/2013    Wt Readings from Last 3 Encounters:  10/11/13 343 lb 12 oz (155.924 kg)  08/21/13 358 lb 6 oz (162.558 kg)  08/02/13 362 lb (164.202 kg)  Body mass index is 49.32 kg/(m^2). Has been losing weight consistently!!  Has been walking 2 miles daily.  Pt going to bariatric clinic and receiving b12 shots.   Relevant past medical, surgical, family and social history reviewed and updated as indicated.  Allergies and medications reviewed and updated. Current Outpatient Prescriptions on File Prior to Visit  Medication Sig  . albuterol (PROVENTIL HFA;VENTOLIN HFA) 108 (90 BASE) MCG/ACT inhaler Inhale 2 puffs into the lungs every 6 (six) hours as needed for wheezing or shortness of breath.  . asMarland Kitchenirin EC 81 MG tablet Take 162 mg by mouth daily.    . CAMarland KitchenAFATE 1 GM/10ML suspension TAKE 10 MLS BY MOUTH 4 TIMES DAILY  . esomeprazole (NEXIUM) 40 MG capsule Take 1 capsule (40 mg total) by mouth 2 (two) times daily.  . Flaxseed, Linseed, (FLAX SEED OIL) 1000 MG CAPS Take 1 capsule by mouth 2 (two) times daily.    . furosemide (LASIX) 20 MG tablet Take 20 mg by mouth daily.  . hydrochlorothiazide (HYDRODIURIL) 25 MG tablet TAKE 1 TABLET BY MOUTH ONCE A DAY  .  HYDROcodone-acetaminophen (NORCO) 10-325 MG per tablet Take 1 tablet by mouth 3 (three) times daily as needed for moderate pain.  . Insulin Pen Needle 31G X 8 MM MISC 1 Units by Does not apply route as directed.  . JAMarland KitchenUVIA 50 MG tablet TAKE 1 TABLET BY MOUTH ONCE A DAY  . levothyroxine (SYNTHROID, LEVOTHROID) 100 MCG tablet TAKE 1 TABLET BY MOUTH ONCE A DAY  . lisinopril (PRINIVIL,ZESTRIL) 40 MG tablet TAKE 1 TABLET BY MOUTH ONCE A DAY  . metFORMIN (GLUCOPHAGE) 1000 MG tablet Take 1,000 mg by mouth 2 (two) times daily with a meal.   . metoCLOPramide (REGLAN) 10 MG tablet Take 1 tablet (10 mg total) by mouth 3 (three) times daily before meals.  . metoprolol tartrate (LOPRESSOR) 25 MG tablet Take 12.5 mg by mouth 2 (two) times daily.  . Multiple Vitamin (MULITIVITAMIN WITH MINERALS) TABS Take 1 tablet by mouth daily.  . omMarland Kitchenga-3 acid ethyl esters (LOVAZA) 1 G capsule TAKE 2 CAPSULES BY MOUTH TWICE A DAY  . potassium chloride (K-DUR) 10 MEQ tablet Take 1 tablet (10 mEq total) by mouth 2 (two) times daily as needed.  . Red Yeast Rice 600 MG CAPS Take 2 capsules by mouth daily.  . traMADol (ULTRAM) 50 MG tablet TAKE 1 TABLET BY MOUTH EVERY 12 HOURS  ASNEEDED FOR PAIN  . docusate sodium 100 MG CAPS Take 100 mg by mouth daily as needed for mild constipation.  . Insulin Glargine (LANTUS SOLOSTAR) 100 UNIT/ML Solostar Pen Inject 10 Units into the skin daily at 10 pm.  . meclizine (ANTIVERT) 25 MG tablet Take 1 tablet (25 mg total) by mouth 2 (two) times daily as needed for dizziness or nausea.  . nitroGLYCERIN (NITROSTAT) 0.4 MG SL tablet Place 1 tablet (0.4 mg total) under the tongue every 5 (five) minutes as needed for chest pain.  Marland Kitchen ondansetron (ZOFRAN) 4 MG tablet Take 1 tab every 6 hours as needed for nausea.  . polyethylene glycol powder (GLYCOLAX/MIRALAX) powder Take 17 g by mouth daily.   No current facility-administered medications on file prior to visit.    Review of Systems Per HPI unless  specifically indicated above    Objective:    BP 132/84  Pulse 68  Temp(Src) 97.6 F (36.4 C) (Oral)  Wt 343 lb 12 oz (155.924 kg)  Physical Exam  Nursing note and vitals reviewed. Constitutional: He appears well-developed and well-nourished. No distress.  HENT:  Mouth/Throat: Oropharynx is clear and moist. No oropharyngeal exudate.  Eyes:  Dilated eyes - today had eye exam done  Cardiovascular: Normal rate, regular rhythm, normal heart sounds and intact distal pulses.   No murmur heard. Pulmonary/Chest: Effort normal and breath sounds normal. No respiratory distress. He has no wheezes. He has no rales.  Musculoskeletal: He exhibits no edema.       Assessment & Plan:   Problem List Items Addressed This Visit   Diabetes type 2, uncontrolled - Primary     Now with hypoglycemic episodes attributed to increased activity. Will stop glipizide XL 47m bid and actos 387mdaily. Will hold lantus for now with option to restart if sugars trending up again. Discussed monitoring sugars, continue januvia 5035maily and decrease metformin to 1000m41md. F/u with me at previously scheduled appt 10/2013 Pt agrees with plan. Reviewed hypoglycemic plan.    Morbid obesity      Congratulated on increased activity leading to 20lb weight loss in last 2 months. States previously lost 70lbs. Wt Readings from Last 3 Encounters:  10/11/13 343 lb 12 oz (155.924 kg)  08/21/13 358 lb 6 oz (162.558 kg)  08/02/13 362 lb (164.202 kg)          Follow up plan: Return as needed, for keep prior appointment.

## 2013-10-11 NOTE — Progress Notes (Signed)
Pre visit review using our clinic review tool, if applicable. No additional management support is needed unless otherwise documented below in the visit note. 

## 2013-10-17 ENCOUNTER — Encounter: Payer: Self-pay | Admitting: Family Medicine

## 2013-10-17 ENCOUNTER — Encounter: Payer: Self-pay | Admitting: *Deleted

## 2013-10-17 ENCOUNTER — Ambulatory Visit (INDEPENDENT_AMBULATORY_CARE_PROVIDER_SITE_OTHER): Payer: Medicare Other | Admitting: Family Medicine

## 2013-10-17 VITALS — BP 118/72 | HR 68 | Temp 97.5°F | Wt 340.8 lb

## 2013-10-17 DIAGNOSIS — H34812 Central retinal vein occlusion, left eye, with macular edema: Secondary | ICD-10-CM | POA: Insufficient documentation

## 2013-10-17 DIAGNOSIS — H3560 Retinal hemorrhage, unspecified eye: Secondary | ICD-10-CM

## 2013-10-17 DIAGNOSIS — I10 Essential (primary) hypertension: Secondary | ICD-10-CM

## 2013-10-17 DIAGNOSIS — E1139 Type 2 diabetes mellitus with other diabetic ophthalmic complication: Secondary | ICD-10-CM

## 2013-10-17 DIAGNOSIS — E11329 Type 2 diabetes mellitus with mild nonproliferative diabetic retinopathy without macular edema: Secondary | ICD-10-CM

## 2013-10-17 DIAGNOSIS — E113299 Type 2 diabetes mellitus with mild nonproliferative diabetic retinopathy without macular edema, unspecified eye: Secondary | ICD-10-CM

## 2013-10-17 DIAGNOSIS — Z79899 Other long term (current) drug therapy: Secondary | ICD-10-CM | POA: Diagnosis not present

## 2013-10-17 DIAGNOSIS — H3562 Retinal hemorrhage, left eye: Secondary | ICD-10-CM

## 2013-10-17 HISTORY — DX: Central retinal vein occlusion, left eye, with macular edema: H34.8120

## 2013-10-17 NOTE — Assessment & Plan Note (Addendum)
ophtho recommends carotid US to eval ?ocular ischemic syndrome due to periph retinal hemes esp OU Will order

## 2013-10-17 NOTE — Patient Instructions (Addendum)
Update controlled substance agreement. Cut lisinopril in half - 21m once daily. Stop hydrochlorothiazide. Decrease potassium to 1 pill daily. Continue lasix 1 pill daily Continue metoprolol 1/2 pill daily. Good to see you today, call [place] questions. We will call you to schedule carotid ultrasound.

## 2013-10-17 NOTE — Assessment & Plan Note (Signed)
Improved readings with recent titration of antihyperglycemics. Off lantus for now.

## 2013-10-17 NOTE — Progress Notes (Signed)
Pre visit review using our clinic review tool, if applicable. No additional management support is needed unless otherwise documented below in the visit note. 

## 2013-10-17 NOTE — Assessment & Plan Note (Signed)
Overtreatment after recent weight loss contributing to presyncope and hypotensive episodes. Stop HCTZ. Decrease lisinopril to 3m daily. Continue metoprolol 12.555mbid. Continue lasix and decrease potassium to one tab daily. Monitor bp closely and update me if persistent lows. Pt/wife agree with plan.

## 2013-10-17 NOTE — Assessment & Plan Note (Signed)
Congratulated on continued weight loss.

## 2013-10-17 NOTE — Progress Notes (Signed)
BP 118/72  Pulse 68  Temp(Src) 97.5 F (36.4 C) (Oral)  Wt 340 lb 12 oz (154.563 kg)   CC: low bp  Subjective:    Patient ID: Chad [name], male    DOB: 1955-10-14, 58 y.o.   MRN: [id number]  HPI: Chad [name] is a 58 y.o. male presenting on 10/17/2013 for Hypotension   See prior note for details.  Has been losing weight through regular exercise regimen under the supervision of Bariatric clinic in [place].  Last week we adjusted antihyperglycemics with improvement in hypoglycemic episodes. This week noticing decreased bp at home along with associated orthostatic dizziness. One day last week when he stood up from recliner started blacking out. Sugar was good. Blood pressure was 78/58. Ate salty crackers and started feeling better. This morning bp 90/68. He is staying well hydrated - with 10 glasses of water daily.   Wt Readings from Last 3 Encounters:  10/17/13 340 lb 12 oz (154.563 kg)  10/11/13 343 lb 12 oz (155.924 kg)  08/21/13 358 lb 6 oz (162.558 kg)   Body mass index is 48.89 kg/(m^2).  BP Readings from Last 3 Encounters:  10/17/13 118/72  10/11/13 132/84  08/21/13 104/54   Lab Results  Component Value Date   CREATININE 1.2 08/21/2013    Also - saw ophtho last week Dr. Rick Duff at [facility] center who recommended carotid ultrasound eval to eval ?ocular ischemic syndrome due to periph retinal hemes noted OU. Pt has not had carotid US eval in past, and no noted bruits, but regardless will order [place]  Relevant past medical, surgical, family and social history reviewed and updated as indicated.  Allergies and medications reviewed and updated. Current Outpatient Prescriptions on File Prior to Visit  Medication Sig  . albuterol (PROVENTIL HFA;VENTOLIN HFA) 108 (90 BASE) MCG/ACT inhaler Inhale 2 puffs into the lungs every 6 (six) hours as needed for wheezing or shortness of breath.  Marland Kitchen aspirin EC 81 MG tablet Take 162 mg by mouth daily.    Marland Kitchen CARAFATE 1 GM/10ML  suspension TAKE 10 MLS BY MOUTH 4 TIMES DAILY  . docusate sodium 100 MG CAPS Take 100 mg by mouth daily as needed for mild constipation.  Marland Kitchen esomeprazole (NEXIUM) 40 MG capsule Take 1 capsule (40 mg total) by mouth 2 (two) times daily.  . Flaxseed, Linseed, (FLAX SEED OIL) 1000 MG CAPS Take 1 capsule by mouth 2 (two) times daily.    . furosemide (LASIX) 20 MG tablet Take 20 mg by mouth daily.  Marland Kitchen HYDROcodone-acetaminophen (NORCO) 10-325 MG per tablet Take 1 tablet by mouth 3 (three) times daily as needed for moderate pain.  . Insulin Glargine (LANTUS SOLOSTAR) 100 UNIT/ML Solostar Pen Inject 10 Units into the skin daily at 10 pm.  . Insulin Pen Needle 31G X 8 MM MISC 1 Units by Does not apply route as directed.  Marland Kitchen JANUVIA 50 MG tablet TAKE 1 TABLET BY MOUTH ONCE A DAY  . levothyroxine (SYNTHROID, LEVOTHROID) 100 MCG tablet TAKE 1 TABLET BY MOUTH ONCE A DAY  . meclizine (ANTIVERT) 25 MG tablet Take 1 tablet (25 mg total) by mouth 2 (two) times daily as needed for dizziness or nausea.  . metFORMIN (GLUCOPHAGE) 1000 MG tablet Take 1,000 mg by mouth 2 (two) times daily with a meal.   . metoCLOPramide (REGLAN) 10 MG tablet Take 1 tablet (10 mg total) by mouth 3 (three) times daily before meals.  . metoprolol tartrate (LOPRESSOR) 25 MG tablet Take  12.5 mg by mouth 2 (two) times daily.  . Multiple Vitamin (MULITIVITAMIN WITH MINERALS) TABS Take 1 tablet by mouth daily.  . nitroGLYCERIN (NITROSTAT) 0.4 MG SL tablet Place 1 tablet (0.4 mg total) under the tongue every 5 (five) minutes as needed for chest pain.  Marland Kitchen omega-3 acid ethyl esters (LOVAZA) 1 G capsule TAKE 2 CAPSULES BY MOUTH TWICE A DAY  . ondansetron (ZOFRAN) 4 MG tablet Take 1 tab every 6 hours as needed for nausea.  . polyethylene glycol powder (GLYCOLAX/MIRALAX) powder Take 17 g by mouth daily.  . Red Yeast Rice 600 MG CAPS Take 2 capsules by mouth daily.  . traMADol (ULTRAM) 50 MG tablet TAKE 1 TABLET BY MOUTH EVERY 12 HOURS ASNEEDED FOR PAIN     No current facility-administered medications on file prior to visit.    Review of Systems Per HPI unless specifically indicated above    Objective:    BP 118/72  Pulse 68  Temp(Src) 97.5 F (36.4 C) (Oral)  Wt 340 lb 12 oz (154.563 kg)  Physical Exam  Nursing note and vitals reviewed. Constitutional: He appears well-developed and well-nourished. No distress.  HENT:  Mouth/Throat: Oropharynx is clear and moist. No oropharyngeal exudate.  Eyes: Conjunctivae and EOM are normal. Pupils are equal, round, and reactive to light.  Neck: Carotid bruit is not present (none appreciated).  Cardiovascular: Normal rate, regular rhythm, normal heart sounds and intact distal pulses.   No murmur heard. Pulmonary/Chest: Effort normal and breath sounds normal. No respiratory distress. He has no wheezes. He has no rales.  Musculoskeletal: He exhibits no edema.  Psychiatric: He has a normal mood and affect.   Results for orders placed in visit on 10/17/13  HM DIABETES EYE EXAM      Result Value Ref Range   HM Diabetic Eye Exam Retinopathy (*) No Retinopathy      Assessment & Plan:   Problem List Items Addressed This Visit   HTN (hypertension) - Primary     Overtreatment after recent weight loss contributing to presyncope and hypotensive episodes. Stop HCTZ. Decrease lisinopril to 51m daily. Continue metoprolol 12.578mbid. Continue lasix and decrease potassium to one tab daily. Monitor bp closely and update me if persistent lows. Pt/wife agree with plan.    Relevant Medications      lisinopril (PRINIVIL,ZESTRIL) 40 MG tablet   Morbid obesity     Congratulated on continued weight loss.    Retinal hemorrhage of left eye     [place]htho recommends [place] ?ocular ischemic syndrome due to periph retinal hemes esp OU Will order    Type 2 diabetes, controlled, with nonproliferative diabetic retinopathy without macular edema     Improved readings with recent titration of  antihyperglycemics. Off lantus for now.    Relevant Medications      lisinopril (PRINIVIL,ZESTRIL) 40 MG tablet       Follow up plan: Return if symptoms worsen or fail to improve.

## 2013-10-21 MED ORDER — ONDANSETRON HCL 4 MG PO TABS: ORAL_TABLET | ORAL | Status: DC

## 2013-10-21 NOTE — Telephone Encounter (Signed)
I called the patient to advise we sent the zofran prescription to [facility].

## 2013-10-22 ENCOUNTER — Encounter: Payer: Self-pay | Admitting: Internal Medicine

## 2013-10-22 ENCOUNTER — Ambulatory Visit (INDEPENDENT_AMBULATORY_CARE_PROVIDER_SITE_OTHER): Payer: Medicare Other | Admitting: Internal Medicine

## 2013-10-22 VITALS — BP 118/62 | HR 83 | Temp 97.9°F | Wt 335.8 lb

## 2013-10-22 DIAGNOSIS — J069 Acute upper respiratory infection, unspecified: Secondary | ICD-10-CM

## 2013-10-22 DIAGNOSIS — B9789 Other viral agents as the cause of diseases classified elsewhere: Principal | ICD-10-CM

## 2013-10-22 HISTORY — PX: OTHER SURGICAL HISTORY: SHX169

## 2013-10-22 MED ORDER — PROMETHAZINE-DM 6.25-15 MG/5ML PO SYRP: 5.0000 mL | ORAL_SOLUTION | Freq: Four times a day (QID) | ORAL | Status: DC | PRN

## 2013-10-22 NOTE — Progress Notes (Signed)
HPI  Pt presents to the clinic today with c/o cough and sore throat. He reports this started 2 days. He also has a runny nose with clear mucous. The cough is nonproductive. He denies fever, chills or body aches. He has not tried anything OTC. He does have a history of seasonal allergies. He reports that he has not had sick contacts.  Review of Systems      Past Medical History  Diagnosis Date  . Diabetes mellitus type 2 with retinopathy 1990s    mild background retinopathy  . HTN (hypertension)   . HLD (hyperlipidemia)     statin caused leg cramps  . GERD (gastroesophageal reflux disease)     severe, h/o gastritis and GI bleed, per pt normal EGD at [facility] 2008  . Seasonal allergies   . Colon polyp 2008  . Hypothyroid   . Sensorineural hearing loss, bilateral     hearing aides  . Gastric bypass status for obesity 1985  . Morbid obesity   . Bulging lumbar disc   . Bone spur     L4 L5  . Narrowing of lumbar spine   . Right ear pain     s/p eval by ENT - thought TMJ referred pain and sent to oral surg for dental splint  . Diastolic CHF, chronic 1/94/1740  . Shortness of breath     "comes on at anytime lately" (01/25/2013)  . OSA (obstructive sleep apnea)     unable to use CPAP as of last try 2/2 h/o tracheostomy?  Marland Kitchen Arthritis     "both hips and knees; got shots in each hip in August" (01/25/2013)  . Trifascicular block  RBBB/LPFB/1AVB   . PVC (premature ventricular contraction)     RBBB Infer axis  . Tinnitus     due to sensorineural hearing loss R>L with ETD    Family History  Problem Relation Age of Onset  . Hypertension Mother   . Diabetes Mother   . Cancer Father     lung, smoker  . Diabetes Brother   . Coronary artery disease Brother 38    near MI  . Hypertension Brother   . Stroke Brother   . Cancer Paternal Aunt     brain  . Coronary artery disease Paternal Uncle   . Alzheimer's disease Maternal Grandfather     History   Social History  . Marital Status:  Married    Spouse Name: N/A    Number of Children: N/A  . Years of Education: N/A   Occupational History  . Not on file.   Social History Main Topics  . Smoking status: Never Smoker   . Smokeless tobacco: Never Used  . Alcohol Use: No  . Drug Use: No  . Sexual Activity: Not Currently   Other Topics Concern  . 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  Stays away from fried foods.    Allergies  Allergen Reactions  . Codeine Nausea Only  . Nsaids Other (See Comments)    Acid Reflux and palpitations  . Statins Other (See Comments)    Bad leg cramps  . Sulfa Drugs Cross Reactors Nausea Only     Constitutional:  Denies headache, fatigue, fever or abrupt  weight changes.  HEENT:  Positive sore throat. Denies eye redness, eye pain, pressure behind the eyes, facial pain, nasal congestion, ear pain, ringing in the ears, wax buildup, runny nose or bloody nose. Respiratory: Positive cough. Denies difficulty breathing or shortness of breath.  Cardiovascular: Denies chest pain, chest tightness, palpitations or swelling in the hands or feet.   No other specific complaints in a complete review of systems (except as listed in HPI above).  Objective:  BP 118/62  Pulse 83  Temp(Src) 97.9 F (36.6 C) (Oral)  Wt 335 lb 12 oz (152.295 kg)  SpO2 98%' Wt Readings from Last 3 Encounters:  10/22/13 335 lb 12 oz (152.295 kg)  10/17/13 340 lb 12 oz (154.563 kg)  10/11/13 343 lb 12 oz (155.924 kg)     General: Appears his stated age, obese but well developed, well nourished in NAD. HEENT: Ears: Tm's gray and intact, normal light reflex; Nose: mucosa pink and moist, septum midline; Throat/Mouth: + PND. Teeth present, mucosa erythematous and moist, no exudate noted,  no lesions or ulcerations noted.  Cardiovascular: Normal rate and rhythm. Distant S1,S2 noted.  No murmur, rubs or gallops noted.  Pulmonary/Chest: Normal effort and positive vesicular breath sounds. No respiratory distress. No wheezes, rales or ronchi noted.      Assessment & Plan:   Upper Respiratory Infection with cough, viral:  Get some rest and drink plenty of water Do salt water gargles for the sore throat RX for promethazine-dextromethorphan cough syrup  RTC as needed or if symptoms persist.

## 2013-10-22 NOTE — Progress Notes (Signed)
Pre visit review using our clinic review tool, if applicable. No additional management support is needed unless otherwise documented below in the visit note. 

## 2013-10-22 NOTE — Patient Instructions (Addendum)
Cough, Adult  A cough is a reflex that helps clear your throat and airways. It can help heal the body or may be a reaction to an irritated airway. A cough may only last 2 or 3 weeks (acute) or may last more than 8 weeks (chronic).  CAUSES Acute cough:  Viral or bacterial infections. Chronic cough:  Infections.  Allergies.  Asthma.  Post-nasal drip.  Smoking.  Heartburn or acid reflux.  Some medicines.  Chronic lung problems (COPD).  Cancer. SYMPTOMS   Cough.  Fever.  Chest pain.  Increased breathing rate.  High-pitched whistling sound when breathing (wheezing).  Colored mucus that you cough up (sputum). TREATMENT   A bacterial cough may be treated with antibiotic medicine.  A viral cough must run its course and will not respond to antibiotics.  Your caregiver may recommend other treatments if you have a chronic cough. HOME CARE INSTRUCTIONS   Only take over-the-counter or prescription medicines for pain, discomfort, or fever as directed by your caregiver. Use cough suppressants only as directed by your caregiver.  Use a cold steam vaporizer or humidifier in your bedroom or home to help loosen secretions.  Sleep in a semi-upright position if your cough is worse at night.  Rest as needed.  Stop smoking if you smoke. SEEK IMMEDIATE MEDICAL CARE IF:   You have pus in your sputum.  Your cough starts to worsen.  You cannot control your cough with suppressants and are losing sleep.  You begin coughing up blood.  You have difficulty breathing.  You develop pain which is getting worse or is uncontrolled with medicine.  You have a fever. MAKE SURE YOU:   Understand these instructions.  Will watch your condition.  Will get help right away if you are not doing well or get worse. Document Released: 08/06/2010 Document Revised: 05/02/2011 Document Reviewed: 08/06/2010 Exit[employer] Patient Information 2015 [employer]. This information is not intended  to replace advice given to you by your health care provider. Make sure you discuss any questions you have with your health care provider.  

## 2013-10-24 ENCOUNTER — Other Ambulatory Visit: Payer: Self-pay | Admitting: *Deleted

## 2013-10-24 ENCOUNTER — Encounter: Payer: Self-pay | Admitting: Family Medicine

## 2013-10-24 MED ORDER — LISINOPRIL 40 MG PO TABS: ORAL_TABLET | ORAL | Status: DC

## 2013-10-25 ENCOUNTER — Other Ambulatory Visit: Payer: Self-pay | Admitting: *Deleted

## 2013-10-25 MED ORDER — TRAMADOL HCL 50 MG PO TABS: ORAL_TABLET | ORAL | Status: DC

## 2013-10-25 NOTE — Telephone Encounter (Signed)
Ok to refill 

## 2013-10-25 NOTE — Telephone Encounter (Signed)
plz phone in. 

## 2013-10-29 NOTE — Telephone Encounter (Signed)
Rx called in as directed.   

## 2013-10-31 ENCOUNTER — Encounter (INDEPENDENT_AMBULATORY_CARE_PROVIDER_SITE_OTHER): Payer: Medicare Other

## 2013-10-31 ENCOUNTER — Encounter: Payer: Self-pay | Admitting: Family Medicine

## 2013-10-31 ENCOUNTER — Encounter: Payer: Self-pay | Admitting: *Deleted

## 2013-10-31 DIAGNOSIS — R42 Dizziness and giddiness: Secondary | ICD-10-CM

## 2013-10-31 DIAGNOSIS — I6529 Occlusion and stenosis of unspecified carotid artery: Secondary | ICD-10-CM

## 2013-10-31 DIAGNOSIS — E113299 Type 2 diabetes mellitus with mild nonproliferative diabetic retinopathy without macular edema, unspecified eye: Secondary | ICD-10-CM

## 2013-10-31 DIAGNOSIS — I10 Essential (primary) hypertension: Secondary | ICD-10-CM

## 2013-10-31 DIAGNOSIS — H3562 Retinal hemorrhage, left eye: Secondary | ICD-10-CM

## 2013-11-04 ENCOUNTER — Encounter: Payer: Self-pay | Admitting: Family Medicine

## 2013-11-04 ENCOUNTER — Ambulatory Visit (INDEPENDENT_AMBULATORY_CARE_PROVIDER_SITE_OTHER): Payer: Medicare Other | Admitting: Family Medicine

## 2013-11-04 VITALS — BP 124/82 | HR 72 | Temp 98.1°F | Wt 335.5 lb

## 2013-11-04 DIAGNOSIS — R059 Cough, unspecified: Secondary | ICD-10-CM | POA: Insufficient documentation

## 2013-11-04 DIAGNOSIS — E113299 Type 2 diabetes mellitus with mild nonproliferative diabetic retinopathy without macular edema, unspecified eye: Secondary | ICD-10-CM

## 2013-11-04 DIAGNOSIS — R05 Cough: Secondary | ICD-10-CM | POA: Insufficient documentation

## 2013-11-04 DIAGNOSIS — B354 Tinea corporis: Secondary | ICD-10-CM | POA: Insufficient documentation

## 2013-11-04 DIAGNOSIS — Z23 Encounter for immunization: Secondary | ICD-10-CM | POA: Diagnosis not present

## 2013-11-04 DIAGNOSIS — I10 Essential (primary) hypertension: Secondary | ICD-10-CM

## 2013-11-04 DIAGNOSIS — E039 Hypothyroidism, unspecified: Secondary | ICD-10-CM

## 2013-11-04 DIAGNOSIS — E785 Hyperlipidemia, unspecified: Secondary | ICD-10-CM

## 2013-11-04 DIAGNOSIS — R300 Dysuria: Secondary | ICD-10-CM

## 2013-11-04 DIAGNOSIS — E1139 Type 2 diabetes mellitus with other diabetic ophthalmic complication: Secondary | ICD-10-CM | POA: Diagnosis not present

## 2013-11-04 DIAGNOSIS — R892 Abnormal level of other drugs, medicaments and biological substances in specimens from other organs, systems and tissues: Secondary | ICD-10-CM | POA: Diagnosis not present

## 2013-11-04 DIAGNOSIS — R21 Rash and other nonspecific skin eruption: Secondary | ICD-10-CM

## 2013-11-04 DIAGNOSIS — E11329 Type 2 diabetes mellitus with mild nonproliferative diabetic retinopathy without macular edema: Secondary | ICD-10-CM | POA: Diagnosis not present

## 2013-11-04 LAB — POCT URINALYSIS DIPSTICK
BILIRUBIN UA: NEGATIVE
Blood, UA: NEGATIVE
Glucose, UA: NEGATIVE
KETONES UA: NEGATIVE
Nitrite, UA: NEGATIVE
PROTEIN UA: NEGATIVE
SPEC GRAV UA: 1.015
Urobilinogen, UA: 0.2
pH, UA: 6

## 2013-11-04 LAB — COMPREHENSIVE METABOLIC PANEL
ALT: 44 U/L (ref 0–53)
AST: 43 U/L — AB (ref 0–37)
Albumin: 3.7 g/dL (ref 3.5–5.2)
Alkaline Phosphatase: 118 U/L — ABNORMAL HIGH (ref 39–117)
BUN: 16 mg/dL (ref 6–23)
CALCIUM: 8.8 mg/dL (ref 8.4–10.5)
CHLORIDE: 102 meq/L (ref 96–112)
CO2: 22 mEq/L (ref 19–32)
Creatinine, Ser: 1.1 mg/dL (ref 0.4–1.5)
GFR: 69.95 mL/min (ref >60.00)
Glucose, Bld: 141 mg/dL — ABNORMAL HIGH (ref 70–99)
POTASSIUM: 3.7 meq/L (ref 3.5–5.1)
SODIUM: 134 meq/L — AB (ref 135–145)
TOTAL PROTEIN: 6.5 g/dL (ref 6.0–8.3)
Total Bilirubin: 1 mg/dL (ref 0.2–1.2)

## 2013-11-04 LAB — HEMOGLOBIN A1C: Hgb A1c MFr Bld: 6 % (ref 4.6–6.5)

## 2013-11-04 LAB — LDL CHOLESTEROL, DIRECT: Direct LDL: 101.8 mg/dL

## 2013-11-04 LAB — TSH: TSH: 0.85 u[IU]/mL (ref 0.35–4.50)

## 2013-11-04 MED ORDER — HYDROCODONE-ACETAMINOPHEN 10-325 MG PO TABS: 1.0000 | ORAL_TABLET | Freq: Three times a day (TID) | ORAL | Status: DC | PRN

## 2013-11-04 MED ORDER — TRIAMCINOLONE ACETONIDE 0.1 % EX CREA: 1.0000 "application " | TOPICAL_CREAM | Freq: Two times a day (BID) | CUTANEOUS | Status: DC

## 2013-11-04 MED ORDER — HYDROCOD POLST-CHLORPHEN POLST 10-8 MG/5ML PO LQCR: 5.0000 mL | Freq: Every evening | ORAL | Status: DC | PRN

## 2013-11-04 NOTE — Progress Notes (Addendum)
BP 124/82  Pulse 72  Temp(Src) 98.1 F (36.7 C) (Oral)  Wt 335 lb 8 oz (152.182 kg)   CC: 4 mo f/u  Subjective:    Patient ID: Chad [name], male    DOB: Feb 21, 1956, 58 y.o.   MRN: [id number]  HPI: Chad [name] is a 58 y.o. male presenting on 11/04/2013 for Follow-up and Rash   Recently seen for viral bronchitis - treated with phenergan cough syrup which hasn't helped. No fevers. Productive cough. Requests refill of prior tussionex cough medicine which worked well.    UDS recently inappropriately negative for hydrocodone despite #60 pills refilled monthly regularly. Discussed with patient.   Last few months participating in bariatric clinic to lose weight - has lost ~30lb over last 6 months. Able to come off several of his medications including latest lantus and lisinopril.  Brings log of blood pressures (80-100s/30-60s while on lisinopril) and sugars (120-200s fasting and bedtime). Only on metformin 1042m bid and januvia 538mfor diabetes. Only on metoprolol 12.79m67mid for HTN. Planning on walking 5k crop walk for hunger this fall.   Noticed rash on back over last few weeks. Very itchy. No new lotions, detergents, soaps, shampoos, meds, foods. Has been walking more and sweating more.  Oh by the way... very occasional dysuria - will check UA.  Wt Readings from Last 3 Encounters:  11/04/13 335 lb 8 oz (152.182 kg)  10/22/13 335 lb 12 oz (152.295 kg)  10/17/13 340 lb 12 oz (154.563 kg)   Body mass index is 48.14 kg/(m^2).  Lab Results  Component Value Date   HGBA1C 7.9* 08/02/2013    Relevant past medical, surgical, family and social history reviewed and updated as indicated.  Allergies and medications reviewed and updated. Current Outpatient Prescriptions on File Prior to Visit  Medication Sig  . albuterol (PROVENTIL HFA;VENTOLIN HFA) 108 (90 BASE) MCG/ACT inhaler Inhale 2 puffs into the lungs every 6 (six) hours as needed for wheezing or shortness of breath.  . aMarland Kitchenpirin EC  81 MG tablet Take 162 mg by mouth daily.    . CMarland KitchenRAFATE 1 GM/10ML suspension TAKE 10 MLS BY MOUTH 4 TIMES DAILY  . docusate sodium 100 MG CAPS Take 100 mg by mouth daily as needed for mild constipation.  . eMarland Kitchenomeprazole (NEXIUM) 40 MG capsule Take 1 capsule (40 mg total) by mouth 2 (two) times daily.  . Flaxseed, Linseed, (FLAX SEED OIL) 1000 MG CAPS Take 1 capsule by mouth 2 (two) times daily.    . furosemide (LASIX) 20 MG tablet Take 20 mg by mouth daily.  . JMarland KitchenNUVIA 50 MG tablet TAKE 1 TABLET BY MOUTH ONCE A DAY  . levothyroxine (SYNTHROID, LEVOTHROID) 100 MCG tablet TAKE 1 TABLET BY MOUTH ONCE A DAY  . meclizine (ANTIVERT) 25 MG tablet Take 1 tablet (25 mg total) by mouth 2 (two) times daily as needed for dizziness or nausea.  . metFORMIN (GLUCOPHAGE) 1000 MG tablet Take 1,000 mg by mouth 2 (two) times daily with a meal.   . metoCLOPramide (REGLAN) 10 MG tablet Take 1 tablet (10 mg total) by mouth 3 (three) times daily before meals.  . metoprolol tartrate (LOPRESSOR) 25 MG tablet Take 12.5 mg by mouth 2 (two) times daily.  . Multiple Vitamin (MULITIVITAMIN WITH MINERALS) TABS Take 1 tablet by mouth daily.  . nitroGLYCERIN (NITROSTAT) 0.4 MG SL tablet Place 1 tablet (0.4 mg total) under the tongue every 5 (five) minutes as needed for chest pain.  . oMarland [place]-3  acid ethyl esters (LOVAZA) 1 G capsule TAKE 2 CAPSULES BY MOUTH TWICE A DAY  . ondansetron (ZOFRAN) 4 MG tablet Take 1 tab every 6 hours as needed for nausea.  . polyethylene glycol powder (GLYCOLAX/MIRALAX) powder Take 17 g by mouth daily.  . potassium chloride (K-DUR) 10 MEQ tablet Take 10 mEq by mouth once.  . Red Yeast Rice 600 MG CAPS Take 2 capsules by mouth daily.  . traMADol (ULTRAM) 50 MG tablet TAKE 1 TABLET BY MOUTH EVERY 12 HOURS ASNEEDED FOR PAIN   No current facility-administered medications on file prior to visit.    Review of Systems Per HPI unless specifically indicated above    Objective:    BP 124/82  Pulse 72   Temp(Src) 98.1 F (36.7 C) (Oral)  Wt 335 lb 8 oz (152.182 kg)  Physical Exam  Nursing note and vitals reviewed. Constitutional: He appears well-developed and well-nourished. No distress.  Morbidly obese  HENT:  Mouth/Throat: Oropharynx is clear and moist. No oropharyngeal exudate.  Cardiovascular: Normal rate, regular rhythm, normal heart sounds and intact distal pulses.   No murmur heard. Pulmonary/Chest: Effort normal and breath sounds normal. No respiratory distress. He has no wheezes. He has no rales.  Musculoskeletal: He exhibits no edema.  Skin: Skin is warm and dry. Rash noted.  Erythematous pruritic rash along back, faint spots on abdomen. Spares face and extremities   Results for orders placed in visit on 11/04/13  POCT URINALYSIS DIPSTICK      Result Value Ref Range   Color, UA Yellow     Clarity, UA Hazy     Glucose, UA Negative     Bilirubin, UA Negative     Ketones, UA Negative     Spec Grav, UA 1.015     Blood, UA Negative     pH, UA 6.0     Protein, UA Negative     Urobilinogen, UA 0.2     Nitrite, UA Negative     Leukocytes, UA large (3+)        Assessment & Plan:   Problem List Items Addressed This Visit   Type 2 diabetes, controlled, with nonproliferative diabetic retinopathy without macular edema - Primary     Check A1c today. Off lantus for now, only on metformin and januvia.    Relevant Orders      Comprehensive metabolic panel      Hemoglobin A1c   Skin rash     Limited to trunk - no inciting etiology found. Will treat with TCI cream.  ?steroid responsive dermatosis vs heat rash.    Morbid obesity     Continued but slowed weight loss. Congratulated and encouraged continued active lifestyle changes. Body mass index is 48.14 kg/(m^2).    Hypothyroid      Recheck levels today after weight loss. Lab Results  Component Value Date   TSH 3.21 04/15/2013      Relevant Orders      TSH   HTN (hypertension)     Had significant low readings  with lisinopril on board. Now off this med. Only on metoprolol 12.37m bid and lasix 229m No changes made today.    HLD (hyperlipidemia)      Chronic, stable. Continue RYR and lovaza. Statins have caused leg cramps. Check dLDL today. Lab Results  Component Value Date   LDLCALC 104* 01/28/2013      Relevant Orders      Comprehensive metabolic panel      LDL Cholesterol, Direct  Dysuria     Urine suspicious for infection - UCx sent.    Relevant Orders      Urine culture   Cough     Stable exam today. Provided with tussionex script. Discussed only night time use and not to mix with hydrocodone oral.    Abnormal drug screen     Discussed abnormal UDS - and need for more frequent monitoring given higher risk stratification. Pt endorses only taking meds as prescribed, denies diversion of meds. States may go several days without taking medication for pain.     Other Visit Diagnoses   Need for prophylactic vaccination and inoculation against influenza        Relevant Orders       Flu Vaccine QUAD 36+ mos PF IM (Fluarix Quad PF) (Completed)    Burning with urination        Relevant Orders       POCT Urinalysis Dipstick (Completed)        Follow up plan: Return in about 3 months (around 02/03/2014), or as needed, for follow up visit.

## 2013-11-04 NOTE — Assessment & Plan Note (Signed)
Had significant low readings with lisinopril on board. Now off this med. Only on metoprolol 12.24m bid and lasix 290m No changes made today.

## 2013-11-04 NOTE — Addendum Note (Signed)
Addended by: Royann Shivers A on: 11/04/2013 11:17 AM   Modules accepted: Orders

## 2013-11-04 NOTE — Assessment & Plan Note (Signed)
Check A1c today. Off lantus for now, only on metformin and januvia.

## 2013-11-04 NOTE — Assessment & Plan Note (Signed)
Stable exam today. Provided with tussionex script. Discussed only night time use and not to mix with hydrocodone oral.

## 2013-11-04 NOTE — Assessment & Plan Note (Signed)
Chronic, stable. Continue RYR and lovaza. Statins have caused leg cramps. Check dLDL today. Lab Results  Component Value Date   LDLCALC 104* 01/28/2013

## 2013-11-04 NOTE — Patient Instructions (Addendum)
Flu shot today. Blood work today. For rash - try triamcinolone cream for itch. Possible heat rash. If worsening let me know. I've sent in stronger cough syrup for night time and refilled pain medicine. Good to see you today, call us with questions.

## 2013-11-04 NOTE — Assessment & Plan Note (Signed)
Urine suspicious for infection - UCx sent.

## 2013-11-04 NOTE — Assessment & Plan Note (Signed)
Discussed abnormal UDS - and need for more frequent monitoring given higher risk stratification. Pt endorses only taking meds as prescribed, denies diversion of meds. States may go several days without taking medication for pain.

## 2013-11-04 NOTE — Progress Notes (Signed)
Pre visit review using our clinic review tool, if applicable. No additional management support is needed unless otherwise documented below in the visit note. 

## 2013-11-04 NOTE — Assessment & Plan Note (Signed)
Limited to trunk - no inciting etiology found. Will treat with TCI cream.  ?steroid responsive dermatosis vs heat rash.

## 2013-11-04 NOTE — Assessment & Plan Note (Signed)
Continued but slowed weight loss. Congratulated and encouraged continued active lifestyle changes. Body mass index is 48.14 kg/(m^2).

## 2013-11-04 NOTE — Assessment & Plan Note (Signed)
Recheck levels today after weight loss. Lab Results  Component Value Date   TSH 3.21 04/15/2013

## 2013-11-04 NOTE — Addendum Note (Signed)
Addended by: [name] on: 11/04/2013 02:01 PM   Modules accepted: Orders

## 2013-11-05 LAB — URINE CULTURE
COLONY COUNT: NO GROWTH
Organism ID, Bacteria: NO GROWTH

## 2013-11-07 ENCOUNTER — Other Ambulatory Visit: Payer: Self-pay | Admitting: Family Medicine

## 2013-12-04 ENCOUNTER — Other Ambulatory Visit: Payer: Self-pay

## 2013-12-04 MED ORDER — HYDROCODONE-ACETAMINOPHEN 10-325 MG PO TABS: 1.0000 | ORAL_TABLET | Freq: Three times a day (TID) | ORAL | Status: DC | PRN

## 2013-12-04 NOTE — Telephone Encounter (Signed)
Message left notifying patient and Rx placed up front for pick up.

## 2013-12-04 NOTE — Telephone Encounter (Signed)
printed and placed in [facility]' box.

## 2013-12-04 NOTE — Telephone Encounter (Signed)
Katherine left v/m requesting rx hydrocodone apap. Call when ready for pick up.

## 2013-12-19 ENCOUNTER — Other Ambulatory Visit: Payer: Self-pay | Admitting: *Deleted

## 2013-12-19 MED ORDER — ESOMEPRAZOLE MAGNESIUM 40 MG PO CPDR: 40.0000 mg | DELAYED_RELEASE_CAPSULE | Freq: Two times a day (BID) | ORAL | Status: DC

## 2013-12-25 ENCOUNTER — Other Ambulatory Visit: Payer: Self-pay | Admitting: Family Medicine

## 2014-01-06 ENCOUNTER — Other Ambulatory Visit: Payer: Self-pay

## 2014-01-06 MED ORDER — HYDROCODONE-ACETAMINOPHEN 10-325 MG PO TABS: 1.0000 | ORAL_TABLET | Freq: Three times a day (TID) | ORAL | Status: DC | PRN

## 2014-01-06 NOTE — Telephone Encounter (Signed)
Message left notifying patient and Rx placed up front for pick up.

## 2014-01-06 NOTE — Telephone Encounter (Signed)
Pt left v/m requesting rx hydrocodone apap. Call when ready for pick up.  

## 2014-01-06 NOTE — Telephone Encounter (Signed)
Printed and placed in Kim's box.

## 2014-01-14 ENCOUNTER — Other Ambulatory Visit: Payer: Self-pay | Admitting: *Deleted

## 2014-01-14 MED ORDER — METOCLOPRAMIDE HCL 10 MG PO TABS: 10.0000 mg | ORAL_TABLET | Freq: Three times a day (TID) | ORAL | Status: DC

## 2014-01-23 ENCOUNTER — Other Ambulatory Visit: Payer: Self-pay | Admitting: Family Medicine

## 2014-01-30 ENCOUNTER — Encounter (HOSPITAL_COMMUNITY): Payer: Self-pay | Admitting: Cardiovascular Disease

## 2014-02-03 ENCOUNTER — Ambulatory Visit (INDEPENDENT_AMBULATORY_CARE_PROVIDER_SITE_OTHER): Payer: Medicare Other | Admitting: Physician Assistant

## 2014-02-03 ENCOUNTER — Encounter: Payer: Self-pay | Admitting: Physician Assistant

## 2014-02-03 ENCOUNTER — Ambulatory Visit: Payer: Self-pay | Admitting: Physician Assistant

## 2014-02-03 ENCOUNTER — Ambulatory Visit (INDEPENDENT_AMBULATORY_CARE_PROVIDER_SITE_OTHER)
Admission: RE | Admit: 2014-02-03 | Discharge: 2014-02-03 | Disposition: A | Discharge status: Home / Self Care | Payer: Medicare Other | Source: Ambulatory Visit | Attending: Family Medicine | Admitting: Family Medicine

## 2014-02-03 ENCOUNTER — Encounter: Payer: Self-pay | Admitting: Family Medicine

## 2014-02-03 ENCOUNTER — Ambulatory Visit (INDEPENDENT_AMBULATORY_CARE_PROVIDER_SITE_OTHER): Payer: Medicare Other | Admitting: Family Medicine

## 2014-02-03 VITALS — BP 134/77 | HR 60 | Ht 69.0 in | Wt 302.5 lb

## 2014-02-03 VITALS — BP 122/72 | HR 64 | Temp 97.9°F | Wt 302.5 lb

## 2014-02-03 DIAGNOSIS — E039 Hypothyroidism, unspecified: Secondary | ICD-10-CM

## 2014-02-03 DIAGNOSIS — I10 Essential (primary) hypertension: Secondary | ICD-10-CM | POA: Diagnosis not present

## 2014-02-03 DIAGNOSIS — K219 Gastro-esophageal reflux disease without esophagitis: Secondary | ICD-10-CM

## 2014-02-03 DIAGNOSIS — E11329 Type 2 diabetes mellitus with mild nonproliferative diabetic retinopathy without macular edema: Secondary | ICD-10-CM

## 2014-02-03 DIAGNOSIS — I5032 Chronic diastolic (congestive) heart failure: Secondary | ICD-10-CM

## 2014-02-03 DIAGNOSIS — I25110 Atherosclerotic heart disease of native coronary artery with unstable angina pectoris: Secondary | ICD-10-CM

## 2014-02-03 DIAGNOSIS — R071 Chest pain on breathing: Secondary | ICD-10-CM | POA: Diagnosis not present

## 2014-02-03 DIAGNOSIS — R079 Chest pain, unspecified: Secondary | ICD-10-CM | POA: Diagnosis not present

## 2014-02-03 DIAGNOSIS — E113299 Type 2 diabetes mellitus with mild nonproliferative diabetic retinopathy without macular edema, unspecified eye: Secondary | ICD-10-CM

## 2014-02-03 DIAGNOSIS — R0789 Other chest pain: Secondary | ICD-10-CM | POA: Diagnosis not present

## 2014-02-03 LAB — COMPREHENSIVE METABOLIC PANEL
ALBUMIN: 4.1 g/dL (ref 3.5–5.2)
ALT: 29 U/L (ref 0–53)
AST: 41 U/L — AB (ref 0–37)
Alkaline Phosphatase: 140 U/L — ABNORMAL HIGH (ref 39–117)
BUN: 13 mg/dL (ref 6–23)
CHLORIDE: 101 meq/L (ref 96–112)
CO2: 27 mEq/L (ref 19–32)
CREATININE: 0.9 mg/dL (ref 0.4–1.5)
Calcium: 9.4 mg/dL (ref 8.4–10.5)
GFR: 90.65 mL/min (ref >60.00)
Glucose, Bld: 96 mg/dL (ref 70–99)
Potassium: 4.4 mEq/L (ref 3.5–5.1)
Sodium: 136 mEq/L (ref 135–145)
Total Bilirubin: 1.1 mg/dL (ref 0.2–1.2)
Total Protein: 6.4 g/dL (ref 6.0–8.3)

## 2014-02-03 LAB — TROPONIN I
TNIDX: 0 ug/L (ref 0.00–0.06)
Troponin-I: <0.02 ng/mL

## 2014-02-03 LAB — CK-MB: CK-MB: <0.5 ng/mL — ABNORMAL LOW (ref 0.5–3.6)

## 2014-02-03 LAB — CK
CK, Total: 67 U/L (ref 39–308)
Total CK: 65 U/L (ref 7–232)

## 2014-02-03 LAB — TSH: TSH: 0.49 u[IU]/mL (ref 0.35–4.50)

## 2014-02-03 LAB — HEMOGLOBIN A1C: Hgb A1c MFr Bld: 5.9 % (ref 4.6–6.5)

## 2014-02-03 MED ORDER — HYDROCODONE-ACETAMINOPHEN 10-325 MG PO TABS: 1.0000 | ORAL_TABLET | Freq: Three times a day (TID) | ORAL | Status: DC | PRN

## 2014-02-03 MED ORDER — ISOSORBIDE MONONITRATE ER 30 MG PO TB24: 30.0000 mg | ORAL_TABLET | Freq: Every day | ORAL | Status: DC

## 2014-02-03 MED ORDER — NITROGLYCERIN 0.4 MG SL SUBL
0.4000 mg | SUBLINGUAL_TABLET | Freq: Once | SUBLINGUAL | Status: AC
  Administered 2014-02-03: 0.4 mg via SUBLINGUAL

## 2014-02-03 MED ORDER — GI COCKTAIL ~~LOC~~
30.0000 mL | Freq: Once | ORAL | Status: AC
  Administered 2014-02-03: 30 mL via ORAL
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
## 2014-02-03 NOTE — Assessment & Plan Note (Signed)
Check A1c again today. Anticipate persistently good control.

## 2014-02-03 NOTE — Progress Notes (Signed)
Pre visit review using our clinic review tool, if applicable. No additional management support is needed unless otherwise documented below in the visit note. 

## 2014-02-03 NOTE — Patient Instructions (Addendum)
I've refilled hydrocodone. EKG today, GI cocktail today and nitro under tongue. Pass by [place]'s office to see if Dr [name] can see you this week for follow up. Back off exercising until you see Dr [name]. In the interim, start imdur 12m once daily. Return in 3 months for medicare wellness visit

## 2014-02-03 NOTE — Progress Notes (Signed)
BP 122/72 mmHg  Pulse 64  Temp(Src) 97.9 F (36.6 C) (Oral)  Wt 302 lb 8 oz (137.213 kg)   CC: f/u visit  Subjective:    Patient ID: Chad [name], male    DOB: 1955/06/11, 58 y.o.   MRN: [id number]  HPI: Chad [name] is a 58 y.o. male presenting on 02/03/2014 for Follow-up   Marked weight loss over the past year after participating in bariatric clinic - down to 300lbs. Was able to come off several medications including lantus and lisinopril. Recently walked 5k [place] for hunger. Walking 5 mi/day, also works out at gym daily.   Over last 2-3 wks noticing increased chest pain described as pressure associated with dyspnea progressively worsening. Wonders if GERD related. During exercise no discomfort, but does feel this after exercise. Some radiation down left arm and nausea and dyspnea.   No recent URI. Last night awoke with dyspnea.   CARDIAC CATHETERIZATION Date: 01/2013 30% mid LAD disease, otherwise no significant stenoses. Normal ejection fraction of 65%  GERD - takes carafate 1gm QID with meals, nexium 10m bid regularly. No recent increase in spicy foods   UDS - abnormal 10/2013 - negative for hydrocodone. Has been of hydrocodone for last week 2/2 concern for masking chest pain.  DM - sugars doing wonderfully. 2 wks ago sugars spiked to 180s so he restarted lantus. Then sugars dropped again and now off lantus. Lab Results  Component Value Date   HGBA1C 5.9 02/03/2014    Relevant past medical, surgical, family and social history reviewed and updated as indicated. Interim medical history since our last visit reviewed. Allergies and medications reviewed and updated.  Current Outpatient Prescriptions on File Prior to Visit  Medication Sig  . albuterol (PROVENTIL HFA;VENTOLIN HFA) 108 (90 BASE) MCG/ACT inhaler Inhale 2 puffs into the lungs every 6 (six) hours as needed for wheezing or shortness of breath.  .Marland Kitchenaspirin EC 81 MG tablet Take 162 mg by mouth daily.    .Marland Kitchen CARAFATE 1 GM/10ML suspension TAKE 10 MLS BY MOUTH 4 TIMES DAILY  . chlorpheniramine-HYDROcodone (TUSSIONEX) 10-8 MG/5ML LQCR Take 5 mLs by mouth at bedtime as needed for cough. Sedation precautions  . esomeprazole (NEXIUM) 40 MG capsule Take 1 capsule (40 mg total) by mouth 2 (two) times daily.  . Flaxseed, Linseed, (FLAX SEED OIL) 1000 MG CAPS Take 1 capsule by mouth 2 (two) times daily.    .Marland Kitchenlevothyroxine (SYNTHROID, LEVOTHROID) 100 MCG tablet TAKE 1 TABLET BY MOUTH ONCE A DAY  . meclizine (ANTIVERT) 25 MG tablet Take 1 tablet (25 mg total) by mouth 2 (two) times daily as needed for dizziness or nausea.  . metFORMIN (GLUCOPHAGE) 1000 MG tablet Take 1,000 mg by mouth 2 (two) times daily with a meal.   . metoCLOPramide (REGLAN) 10 MG tablet Take 1 tablet (10 mg total) by mouth 3 (three) times daily before meals.  . metoprolol tartrate (LOPRESSOR) 25 MG tablet TAKE 1 TABLET BY MOUTH TWICE A DAY  . Multiple Vitamin (MULITIVITAMIN WITH MINERALS) TABS Take 1 tablet by mouth daily.  . nitroGLYCERIN (NITROSTAT) 0.4 MG SL tablet Place 1 tablet (0.4 mg total) under the tongue every 5 (five) minutes as needed for chest pain.  .Marland Kitchenomega-3 acid ethyl esters (LOVAZA) 1 G capsule TAKE 2 CAPSULES BY MOUTH TWICE A DAY  . ondansetron (ZOFRAN) 4 MG tablet Take 1 tab every 6 hours as needed for nausea.  . polyethylene glycol powder (GLYCOLAX/MIRALAX) powder Take 17 g  by mouth daily.  . potassium chloride (K-DUR) 10 MEQ tablet Take 10 mEq by mouth once.  . Red Yeast Rice 600 MG CAPS Take 2 capsules by mouth daily.  . traMADol (ULTRAM) 50 MG tablet TAKE 1 TABLET BY MOUTH EVERY 12 HOURS ASNEEDED FOR PAIN  . docusate sodium 100 MG CAPS Take 100 mg by mouth daily as needed for mild constipation.  . furosemide (LASIX) 20 MG tablet Take 20 mg by mouth daily.  Marland Kitchen triamcinolone cream (KENALOG) 0.1 % Apply 1 application topically 2 (two) times daily. Apply to AA.   No current facility-administered medications on file prior  to visit.    Review of Systems Per HPI unless specifically indicated above     Objective:    BP 122/72 mmHg  Pulse 64  Temp(Src) 97.9 F (36.6 C) (Oral)  Wt 302 lb 8 oz (137.213 kg)  Wt Readings from Last 3 Encounters:  02/03/14 302 lb 8 oz (137.213 kg)  02/03/14 302 lb 8 oz (137.213 kg)  11/04/13 335 lb 8 oz (152.182 kg)    Physical Exam  Constitutional: He appears well-developed and well-nourished. No distress.  Morbidly obese but evident weight loss Body mass index is 43.4 kg/(m^2).   HENT:  Mouth/Throat: Oropharynx is clear and moist. No oropharyngeal exudate.  Eyes: Conjunctivae and EOM are normal. Pupils are equal, round, and reactive to light. No scleral icterus.  Cardiovascular: Normal rate, regular rhythm, normal heart sounds and intact distal pulses.   No murmur heard. Pulmonary/Chest: Effort normal and breath sounds normal. No respiratory distress. He has no wheezes. He has no rales. He exhibits no tenderness.  No significant reproducible chest wall tenderness  Musculoskeletal: He exhibits no edema.  Lymphadenopathy:    He has no cervical adenopathy.  Psychiatric: He has a normal mood and affect.  Nursing note and vitals reviewed.     Assessment & Plan:   Problem List Items Addressed This Visit    Type 2 diabetes, controlled, with nonproliferative diabetic retinopathy without macular edema    Check A1c again today. Anticipate persistently good control.    Relevant Orders      Hemoglobin A1c (Completed)   Morbid obesity    Congratulated on continued weight loss. Starting weight 367lbs (04/2013)!    Relevant Medications      gi cocktail (Maalox,Lidocaine,Donnatal) (Completed)   Hypothyroid   Relevant Orders      TSH (Completed)   Chest pain - Primary    Reviewed latest catheterization with 30% mid LAD disease and normal EF 01/2013. No improvement in chest discomfort with GI cocktail. Not consistent with MSK cause. Check CXR today. Chest discomfort  improved with SL nitro today - ?cardiac vs esoph spasm given extensive GERD hx? Will see if cards can see pt sooner than next Tuesday, may need further risk stratification. In interim, discussed avoiding any exertion until seen by cards. Pt/wife agree with plan.  EKG today - known RBBB, rate 60s, RAD, no acute ST/T changes    Relevant Medications      gi cocktail (Maalox,Lidocaine,Donnatal) (Completed)   Other Relevant Orders      Comprehensive metabolic panel (Completed)      CK (Completed)      EKG 12-Lead (Completed)      DG Chest 2 View (Completed)      Ambulatory referral to Cardiology      Troponin I (Completed)    Other Visit Diagnoses    Chest discomfort  Relevant Medications       nitroGLYCERIN (NITROSTAT) SL tablet 0.4 mg (Completed)    Other Relevant Orders       Ambulatory referral to Cardiology        Follow up plan: Return in about 3 months (around 05/05/2014), or if symptoms worsen or fail to improve, for annual exam, prior fasting for blood work.

## 2014-02-03 NOTE — Assessment & Plan Note (Signed)
Congratulated on continued weight loss. Starting weight 367lbs (04/2013)!

## 2014-02-03 NOTE — Patient Instructions (Signed)
Please go to the [facility] for stat labs: troponin & cardiac enzymes  Call or return to clinic prn if these symptoms worsen or fail to improve as anticipated.

## 2014-02-03 NOTE — Assessment & Plan Note (Addendum)
Reviewed latest catheterization with 30% mid LAD disease and normal EF 01/2013. No improvement in chest discomfort with GI cocktail. Not consistent with MSK cause. Check CXR today. Chest discomfort improved with SL nitro today - ?cardiac vs esoph spasm given extensive GERD hx? Will see if cards can see pt sooner than next Tuesday, may need further risk stratification. In interim, discussed avoiding any exertion until seen by cards. Pt/wife agree with plan.  EKG today - known RBBB, rate 60s, RAD, no acute ST/T changes

## 2014-02-04 ENCOUNTER — Telehealth: Payer: Self-pay

## 2014-02-04 ENCOUNTER — Encounter: Payer: Self-pay | Admitting: Physician Assistant

## 2014-02-04 DIAGNOSIS — R079 Chest pain, unspecified: Secondary | ICD-10-CM

## 2014-02-04 NOTE — Telephone Encounter (Signed)
Per Christell Faith, PA: "Can you let him know tests were ok.   To continue Imdur 30.   No NSAIDS.   Continue PPI that he was previously on.   We will set him up for a Lexiscan and echo this week. " Pt sched for echo 02/06/14 @ 3:30 & Lexi 12/21 & 12/22 @ 7:30.  Pt is aware of date, time and instructions, including meds to hold before Lexi.

## 2014-02-05 ENCOUNTER — Encounter: Payer: Self-pay | Admitting: *Deleted

## 2014-02-06 ENCOUNTER — Other Ambulatory Visit: Payer: Self-pay

## 2014-02-06 ENCOUNTER — Other Ambulatory Visit (INDEPENDENT_AMBULATORY_CARE_PROVIDER_SITE_OTHER): Payer: Medicare Other

## 2014-02-06 DIAGNOSIS — R079 Chest pain, unspecified: Secondary | ICD-10-CM | POA: Diagnosis not present

## 2014-02-06 DIAGNOSIS — I25110 Atherosclerotic heart disease of native coronary artery with unstable angina pectoris: Secondary | ICD-10-CM

## 2014-02-06 DIAGNOSIS — I5032 Chronic diastolic (congestive) heart failure: Secondary | ICD-10-CM | POA: Diagnosis not present

## 2014-02-10 ENCOUNTER — Ambulatory Visit: Payer: Self-pay | Admitting: Cardiovascular Disease

## 2014-02-10 DIAGNOSIS — R079 Chest pain, unspecified: Secondary | ICD-10-CM | POA: Diagnosis not present

## 2014-02-11 ENCOUNTER — Other Ambulatory Visit: Payer: Self-pay

## 2014-02-11 ENCOUNTER — Ambulatory Visit: Payer: Medicare Other | Admitting: Cardiovascular Disease

## 2014-02-11 DIAGNOSIS — R079 Chest pain, unspecified: Secondary | ICD-10-CM

## 2014-02-18 ENCOUNTER — Other Ambulatory Visit: Payer: Self-pay | Admitting: *Deleted

## 2014-02-18 MED ORDER — ALBUTEROL SULFATE HFA 108 (90 BASE) MCG/ACT IN AERS: 2.0000 | INHALATION_SPRAY | Freq: Four times a day (QID) | RESPIRATORY_TRACT | Status: DC | PRN

## 2014-02-18 MED ORDER — LEVOTHYROXINE SODIUM 100 MCG PO TABS: 100.0000 ug | ORAL_TABLET | Freq: Every day | ORAL | Status: DC

## 2014-02-27 ENCOUNTER — Encounter: Payer: Self-pay | Admitting: Cardiovascular Disease

## 2014-02-27 ENCOUNTER — Ambulatory Visit (INDEPENDENT_AMBULATORY_CARE_PROVIDER_SITE_OTHER): Payer: Medicare Other | Admitting: Cardiovascular Disease

## 2014-02-27 VITALS — BP 122/60 | HR 64 | Ht 69.0 in | Wt 300.0 lb

## 2014-02-27 DIAGNOSIS — I10 Essential (primary) hypertension: Secondary | ICD-10-CM

## 2014-02-27 DIAGNOSIS — R071 Chest pain on breathing: Secondary | ICD-10-CM

## 2014-02-27 DIAGNOSIS — I440 Atrioventricular block, first degree: Secondary | ICD-10-CM | POA: Diagnosis not present

## 2014-02-27 DIAGNOSIS — E11329 Type 2 diabetes mellitus with mild nonproliferative diabetic retinopathy without macular edema: Secondary | ICD-10-CM | POA: Diagnosis not present

## 2014-02-27 DIAGNOSIS — I5032 Chronic diastolic (congestive) heart failure: Secondary | ICD-10-CM

## 2014-02-27 DIAGNOSIS — I2510 Atherosclerotic heart disease of native coronary artery without angina pectoris: Secondary | ICD-10-CM | POA: Diagnosis not present

## 2014-02-27 DIAGNOSIS — E113299 Type 2 diabetes mellitus with mild nonproliferative diabetic retinopathy without macular edema, unspecified eye: Secondary | ICD-10-CM

## 2014-02-27 NOTE — Assessment & Plan Note (Signed)
Blood pressure is well controlled on today's visit. No changes made to the medications. 

## 2014-02-27 NOTE — Assessment & Plan Note (Signed)
He reports a dramatic improvement in his diabetes numbers secondary to improvement in his weight and dietary changes

## 2014-02-27 NOTE — Patient Instructions (Signed)
You are doing well. No medication changes were made.  Please call us if you have new issues that need to be addressed before your next appt.  Your physician wants you to follow-up in: 6 months.  You will receive a reminder letter in the mail two months in advance. If you don't receive a letter, please call our office to schedule the follow-up appointment.   
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
## 2014-02-27 NOTE — Assessment & Plan Note (Signed)
Currently with no symptoms of angina. No further workup at this time. Continue current medication regimen. 

## 2014-02-27 NOTE — Assessment & Plan Note (Signed)
Previous atypical chest pain, stress test with no ischemia. No further workup needed

## 2014-02-27 NOTE — Assessment & Plan Note (Signed)
He appears relatively euvolemic on today's visit. He is taking Lasix approximately daily Weight dramatically improved with dietary changes

## 2014-02-27 NOTE — Assessment & Plan Note (Signed)
Encouraged him to continue his current exercise program and diet changes

## 2014-03-03 ENCOUNTER — Other Ambulatory Visit: Payer: Self-pay | Admitting: Family Medicine

## 2014-03-03 NOTE — Telephone Encounter (Signed)
Ok to refill 

## 2014-03-04 NOTE — Telephone Encounter (Signed)
plz phone in. 

## 2014-03-04 NOTE — Telephone Encounter (Signed)
Rx called in as directed.   

## 2014-03-05 ENCOUNTER — Other Ambulatory Visit: Payer: Self-pay

## 2014-03-05 NOTE — Telephone Encounter (Signed)
Chad [name] left v/m requesting rx hydrocodone apap. Call when ready for pick up.

## 2014-03-06 MED ORDER — HYDROCODONE-ACETAMINOPHEN 10-325 MG PO TABS: 1.0000 | ORAL_TABLET | Freq: Three times a day (TID) | ORAL | Status: DC | PRN

## 2014-03-06 NOTE — Telephone Encounter (Signed)
Message left notifying patient and Rx placed up front for pick up.

## 2014-03-06 NOTE — Telephone Encounter (Signed)
Printed and in Kim's box.

## 2014-03-07 DIAGNOSIS — Z79891 Long term (current) use of opiate analgesic: Secondary | ICD-10-CM | POA: Diagnosis not present

## 2014-03-07 DIAGNOSIS — Z79899 Other long term (current) drug therapy: Secondary | ICD-10-CM | POA: Diagnosis not present

## 2014-03-18 ENCOUNTER — Encounter: Payer: Self-pay | Admitting: Family Medicine

## 2014-03-20 ENCOUNTER — Encounter: Payer: Self-pay | Admitting: Family Medicine

## 2014-03-25 ENCOUNTER — Other Ambulatory Visit: Payer: Self-pay | Admitting: Family Medicine

## 2014-03-26 ENCOUNTER — Other Ambulatory Visit: Payer: Self-pay | Admitting: Family Medicine

## 2014-04-08 ENCOUNTER — Other Ambulatory Visit: Payer: Self-pay | Admitting: *Deleted

## 2014-04-08 MED ORDER — HYDROCODONE-ACETAMINOPHEN 10-325 MG PO TABS: 1.0000 | ORAL_TABLET | Freq: Three times a day (TID) | ORAL | Status: DC | PRN

## 2014-04-08 NOTE — Telephone Encounter (Signed)
plz phone in. 

## 2014-04-08 NOTE — Telephone Encounter (Signed)
I can't call this in

## 2014-04-08 NOTE — Telephone Encounter (Signed)
Last rx written on 03/06/14, last uds was on 03/07/14 and pt was flagged as mod risk. Pt has f/u in 3/16.

## 2014-04-09 ENCOUNTER — Other Ambulatory Visit: Payer: Self-pay | Admitting: *Deleted

## 2014-04-09 MED ORDER — POTASSIUM CHLORIDE ER 10 MEQ PO TBCR: 10.0000 meq | EXTENDED_RELEASE_TABLET | Freq: Two times a day (BID) | ORAL | Status: DC | PRN

## 2014-04-09 NOTE — Telephone Encounter (Signed)
Printed and in kim's box.

## 2014-04-09 NOTE — Telephone Encounter (Signed)
Message left notifying patient and Rx placed up front for pick up.

## 2014-04-17 ENCOUNTER — Telehealth: Payer: Self-pay | Admitting: *Deleted

## 2014-04-17 NOTE — Telephone Encounter (Signed)
Okay to refill both, history of gastroparesis, narcotic use and diabetes Would recommend office follow-up, nonurgent where we should discuss switching to domperidone

## 2014-04-17 NOTE — Telephone Encounter (Signed)
Dr Hilarie Fredrickson- Patient requests refills on Reglan. Looks like he has been on this for quite some time for gastroparesis. At last office visit, patient was started on Zofran. Do you want patient to continue reglan AND zofran or should he discontinue the reglan? Office visit needed?

## 2014-04-18 ENCOUNTER — Other Ambulatory Visit: Payer: Self-pay | Admitting: Family Medicine

## 2014-04-18 MED ORDER — METOCLOPRAMIDE HCL 10 MG PO TABS: 10.0000 mg | ORAL_TABLET | Freq: Three times a day (TID) | ORAL | Status: DC

## 2014-04-18 NOTE — Telephone Encounter (Signed)
Last f/u appt 01/2014

## 2014-04-18 NOTE — Telephone Encounter (Signed)
Rx sent. Note put on rx that patient needs office visit for further refills.

## 2014-04-18 NOTE — Telephone Encounter (Signed)
Medication phoned to pharmacy.  

## 2014-04-18 NOTE — Telephone Encounter (Signed)
plz phone in. 

## 2014-04-23 ENCOUNTER — Telehealth: Payer: Self-pay | Admitting: *Deleted

## 2014-04-23 NOTE — Telephone Encounter (Signed)
PA for Nexium in your IN box for completion.

## 2014-04-24 NOTE — Telephone Encounter (Signed)
Form faxed. Will await determination. 

## 2014-04-24 NOTE — Telephone Encounter (Signed)
Filled and in Kim's box. 

## 2014-04-25 ENCOUNTER — Ambulatory Visit (INDEPENDENT_AMBULATORY_CARE_PROVIDER_SITE_OTHER): Payer: Medicare Other | Admitting: Family Medicine

## 2014-04-25 ENCOUNTER — Ambulatory Visit
Admission: RE | Admit: 2014-04-25 | Discharge: 2014-04-25 | Disposition: A | Discharge status: Home / Self Care | Payer: Medicare Other | Source: Ambulatory Visit | Attending: Family Medicine | Admitting: Family Medicine

## 2014-04-25 ENCOUNTER — Ambulatory Visit (INDEPENDENT_AMBULATORY_CARE_PROVIDER_SITE_OTHER)
Admission: RE | Admit: 2014-04-25 | Discharge: 2014-04-25 | Disposition: A | Discharge status: Home / Self Care | Payer: Medicare Other | Source: Ambulatory Visit | Attending: Family Medicine | Admitting: Family Medicine

## 2014-04-25 ENCOUNTER — Encounter: Payer: Self-pay | Admitting: Family Medicine

## 2014-04-25 VITALS — BP 138/72 | HR 68 | Temp 98.4°F | Wt 286.5 lb

## 2014-04-25 DIAGNOSIS — I2510 Atherosclerotic heart disease of native coronary artery without angina pectoris: Secondary | ICD-10-CM | POA: Diagnosis not present

## 2014-04-25 DIAGNOSIS — E113299 Type 2 diabetes mellitus with mild nonproliferative diabetic retinopathy without macular edema, unspecified eye: Secondary | ICD-10-CM

## 2014-04-25 DIAGNOSIS — E039 Hypothyroidism, unspecified: Secondary | ICD-10-CM | POA: Diagnosis not present

## 2014-04-25 DIAGNOSIS — M79606 Pain in leg, unspecified: Secondary | ICD-10-CM

## 2014-04-25 DIAGNOSIS — E11329 Type 2 diabetes mellitus with mild nonproliferative diabetic retinopathy without macular edema: Secondary | ICD-10-CM | POA: Diagnosis not present

## 2014-04-25 DIAGNOSIS — M79661 Pain in right lower leg: Secondary | ICD-10-CM | POA: Diagnosis not present

## 2014-04-25 DIAGNOSIS — M1712 Unilateral primary osteoarthritis, left knee: Secondary | ICD-10-CM | POA: Diagnosis not present

## 2014-04-25 DIAGNOSIS — M19072 Primary osteoarthritis, left ankle and foot: Secondary | ICD-10-CM | POA: Diagnosis not present

## 2014-04-25 DIAGNOSIS — E785 Hyperlipidemia, unspecified: Secondary | ICD-10-CM | POA: Diagnosis not present

## 2014-04-25 DIAGNOSIS — I10 Essential (primary) hypertension: Secondary | ICD-10-CM | POA: Diagnosis not present

## 2014-04-25 DIAGNOSIS — M84369A Stress fracture, unspecified tibia and fibula, initial encounter for fracture: Secondary | ICD-10-CM | POA: Insufficient documentation

## 2014-04-25 DIAGNOSIS — M1711 Unilateral primary osteoarthritis, right knee: Secondary | ICD-10-CM | POA: Diagnosis not present

## 2014-04-25 DIAGNOSIS — M79662 Pain in left lower leg: Secondary | ICD-10-CM | POA: Diagnosis not present

## 2014-04-25 NOTE — Progress Notes (Signed)
Pre visit review using our clinic review tool, if applicable. No additional management support is needed unless otherwise documented below in the visit note. 

## 2014-04-25 NOTE — Assessment & Plan Note (Signed)
Shin pain - ?shin splits. Given significant increase in walking and activity, check xrays today to eval for tibial stress fractures. Will call with xray results.

## 2014-04-25 NOTE — Assessment & Plan Note (Addendum)
Chronic, stable. Continue RYR and lovaza. Intolerant to statins. Lab Results  Component Value Date   CHOL 166 01/28/2013   HDL 33* 01/28/2013   LDLCALC 104* 01/28/2013   LDLDIRECT 101.8 11/04/2013   TRIG 144 01/28/2013   CHOLHDL 5.0 01/28/2013

## 2014-04-25 NOTE — Patient Instructions (Signed)
xrays today. Return for labwork after 05/05/2014. You are doing great today!

## 2014-04-25 NOTE — Assessment & Plan Note (Signed)
Continues to lose weight, doing remarkably well. Congratulated and encouraged continued strong work and activity. Body mass index is 42.29 kg/(m^2).

## 2014-04-25 NOTE — Assessment & Plan Note (Signed)
Chronic, stable. Continue regimen. 

## 2014-04-25 NOTE — Assessment & Plan Note (Signed)
Endorses great control based on sugar log he keeps, but forgot to bring today. Now off metformin. Return in 2 wks to recheck labs.

## 2014-04-25 NOTE — Assessment & Plan Note (Signed)
Check tsh next labs. Lab Results  Component Value Date   TSH 0.49 02/03/2014

## 2014-04-25 NOTE — Progress Notes (Signed)
BP 138/72 mmHg  Pulse 68  Temp(Src) 98.4 F (36.9 C) (Oral)  Wt 286 lb 8 oz (129.956 kg)   CC: f/u visit  Subjective:    Patient ID: Chad [name], male    DOB: 1955-03-31, 59 y.o.   MRN: [id number]  HPI: Chad [name] is a 59 y.o. male presenting on 04/25/2014 for Follow-up   Marked weight loss over the past year after participating in bariatric clinic - from 367lbs down to 286lbs. Was able to come off several medications including lantus and lisinopril. Recently walked 5k crop[place] for hunger. Walking 5 mi/day, also works out at gym daily, using stepper.   Having worsening leg pain anterior shins bilaterally, throbbing and sharp pain R>L. Walking 3-4 mi worsens pain. Worse pain at night time or with sitting >55mn at a time. Taking lasix 230mdaily. aspercreme topically helping.   Recent cards eval - good report. Myovioew 01/2014 without ischemia. Cardiac catheterization 01/2013 with 30% mid LAD disease, normal EF.  Chest pain GI in nature - NSAID irritation, rec PPI.  DM - off metformin for lats 3 months. Sugar control great. This morning cbg fasting 118.   Relevant past medical, surgical, family and social history reviewed and updated as indicated. Interim medical history since our last visit reviewed. Allergies and medications reviewed and updated. Current Outpatient Prescriptions on File Prior to Visit  Medication Sig  . albuterol (PROVENTIL HFA;VENTOLIN HFA) 108 (90 BASE) MCG/ACT inhaler Inhale 2 puffs into the lungs every 6 (six) hours as needed for wheezing or shortness of breath.  . Marland Kitchenspirin EC 81 MG tablet Take 162 mg by mouth daily.    . Marland KitchenARAFATE 1 GM/10ML suspension TAKE 10 MLS BY MOUTH 4 TIMES DAILY  . docusate sodium 100 MG CAPS Take 100 mg by mouth daily as needed for mild constipation.  . Flaxseed, Linseed, (FLAX SEED OIL) 1000 MG CAPS Take 1 capsule by mouth 2 (two) times daily.    . Marland KitchenYDROcodone-acetaminophen (NORCO) 10-325 MG per tablet Take 1 tablet by mouth 3  (three) times daily as needed for moderate pain.  . isosorbide mononitrate (IMDUR) 30 MG 24 hr tablet Take 1 tablet (30 mg total) by mouth daily.  . Marland Kitchenevothyroxine (SYNTHROID, LEVOTHROID) 100 MCG tablet Take 1 tablet (100 mcg total) by mouth daily.  . meclizine (ANTIVERT) 25 MG tablet Take 1 tablet (25 mg total) by mouth 2 (two) times daily as needed for dizziness or nausea.  . metoCLOPramide (REGLAN) 10 MG tablet Take 1 tablet (10 mg total) by mouth 3 (three) times daily before meals. NEEDS OFFICE VISIT FOR FURTHER REFILLS  . metoprolol tartrate (LOPRESSOR) 25 MG tablet TAKE 1 TABLET BY MOUTH TWICE A DAY  . Multiple Vitamin (MULITIVITAMIN WITH MINERALS) TABS Take 1 tablet by mouth daily.  . Marland KitchenEXIUM 40 MG capsule TAKE 1 CAPSULE BY MOUTH TWICE DAILY  . nitroGLYCERIN (NITROSTAT) 0.4 MG SL tablet Place 1 tablet (0.4 mg total) under the tongue every 5 (five) minutes as needed for chest pain.  . Marland Kitchenmega-3 acid ethyl esters (LOVAZA) 1 G capsule TAKE 2 CAPSULES BY MOUTH TWICE A DAY  . ondansetron (ZOFRAN) 4 MG tablet Take 1 tab every 6 hours as needed for nausea.  . polyethylene glycol powder (GLYCOLAX/MIRALAX) powder Take 17 g by mouth daily.  . Red Yeast Rice 600 MG CAPS Take 2 capsules by mouth daily.  . traMADol (ULTRAM) 50 MG tablet TAKE 1 TABLET BY MOUTH TWICE A DAY AS NEEDED  . triamcinolone cream (  KENALOG) 0.1 % Apply 1 application topically 2 (two) times daily. Apply to AA.   No current facility-administered medications on file prior to visit.    Review of Systems Per HPI unless specifically indicated above     Objective:    BP 138/72 mmHg  Pulse 68  Temp(Src) 98.4 F (36.9 C) (Oral)  Wt 286 lb 8 oz (129.956 kg)  Wt Readings from Last 3 Encounters:  04/25/14 286 lb 8 oz (129.956 kg)  02/27/14 300 lb (136.079 kg)  02/03/14 302 lb 8 oz (137.213 kg)   Body mass index is 42.29 kg/(m^2).  Physical Exam  Constitutional: He appears well-developed and well-nourished. No distress.  HENT:    Head: Normocephalic and atraumatic.  Right Ear: External ear normal.  Left Ear: External ear normal.  Nose: Nose normal.  Mouth/Throat: Oropharynx is clear and moist. No oropharyngeal exudate.  Eyes: Conjunctivae and EOM are normal. Pupils are equal, round, and reactive to light. No scleral icterus.  Neck: Normal range of motion. Neck supple.  Cardiovascular: Normal rate, regular rhythm, normal heart sounds and intact distal pulses.   No murmur heard. Pulmonary/Chest: Effort normal and breath sounds normal. No respiratory distress. He has no wheezes. He has no rales.  Musculoskeletal: He exhibits edema (tr pedal).  See HPI for foot exam if done Tender to palpation bilateral lower legs, + squeeze test, no calf pain or swelling, no significant edema  Lymphadenopathy:    He has no cervical adenopathy.  Skin: Skin is warm and dry. No rash noted.  Psychiatric: He has a normal mood and affect.  Nursing note and vitals reviewed.  Results for orders placed or performed in visit on 02/03/14  Comprehensive metabolic panel  Result Value Ref Range   Sodium 136 135 - 145 mEq/L   Potassium 4.4 3.5 - 5.1 mEq/L   Chloride 101 96 - 112 mEq/L   CO2 27 19 - 32 mEq/L   Glucose, Bld 96 70 - 99 mg/dL   BUN 13 6 - 23 mg/dL   Creatinine, Ser 0.9 0.4 - 1.5 mg/dL   Total Bilirubin 1.1 0.2 - 1.2 mg/dL   Alkaline Phosphatase 140 (H) 39 - 117 U/L   AST 41 (H) 0 - 37 U/L   ALT 29 0 - 53 U/L   Total Protein 6.4 6.0 - 8.3 g/dL   Albumin 4.1 3.5 - 5.2 g/dL   Calcium 9.4 8.4 - 10.5 mg/dL   GFR 90.65 >60.00 mL/min  TSH  Result Value Ref Range   TSH 0.49 0.35 - 4.50 uIU/mL  Hemoglobin A1c  Result Value Ref Range   Hgb A1c MFr Bld 5.9 4.6 - 6.5 %  CK  Result Value Ref Range   Total CK 65 7 - 232 U/L  Troponin I  Result Value Ref Range   TNIDX 0.00 0.00 - 0.06 ug/l      Assessment & Plan:   Problem List Items Addressed This Visit    Type 2 diabetes, controlled, with nonproliferative diabetic  retinopathy without macular edema    Endorses great control based on sugar log he keeps, but forgot to bring today. Now off metformin. Return in 2 wks to recheck labs.      Morbid obesity    Continues to lose weight, doing remarkably well. Congratulated and encouraged continued strong work and activity. Body mass index is 42.29 kg/(m^2).       Hypothyroid    Check tsh next labs. Lab Results  Component Value Date  TSH 0.49 02/03/2014        HTN (hypertension)    Chronic, stable. Continue regimen.      Relevant Medications   furosemide (LASIX) tablet   HLD (hyperlipidemia)    Chronic, stable. Continue RYR and lovaza. Intolerant to statins. Lab Results  Component Value Date   CHOL 166 01/28/2013   HDL 33* 01/28/2013   LDLCALC 104* 01/28/2013   LDLDIRECT 101.8 11/04/2013   TRIG 144 01/28/2013   CHOLHDL 5.0 01/28/2013        Relevant Medications   furosemide (LASIX) tablet   Anterior leg pain - Primary    Shin pain - ?shin splits. Given significant increase in walking and activity, check xrays today to eval for tibial stress fractures. Will call with xray results.      Relevant Orders   DG Tibia/Fibula Left   DG Tibia/Fibula Right       Follow up plan: Return in about 4 months (around 08/25/2014), or if symptoms worsen or fail to improve, for follow up visit.

## 2014-04-28 ENCOUNTER — Ambulatory Visit (INDEPENDENT_AMBULATORY_CARE_PROVIDER_SITE_OTHER): Payer: Medicare Other | Admitting: Family Medicine

## 2014-04-28 ENCOUNTER — Encounter: Payer: Self-pay | Admitting: Family Medicine

## 2014-04-28 VITALS — BP 140/66 | HR 64 | Temp 98.3°F | Ht 69.0 in | Wt 286.5 lb

## 2014-04-28 DIAGNOSIS — I2510 Atherosclerotic heart disease of native coronary artery without angina pectoris: Secondary | ICD-10-CM

## 2014-04-28 DIAGNOSIS — M84369A Stress fracture, unspecified tibia and fibula, initial encounter for fracture: Secondary | ICD-10-CM | POA: Diagnosis not present

## 2014-04-28 NOTE — Progress Notes (Signed)
Pre visit review using our clinic review tool, if applicable. No additional management support is needed unless otherwise documented below in the visit note. 

## 2014-04-28 NOTE — Progress Notes (Signed)
Dr. Frederico Hamman T. Takeila Thayne, MD, [facility] [place], [address] Phone: [phone] Fax: [phone]  04/28/2014  Patient: Chad [name], MRN: [id number], DOB: 1955-09-25, 59 y.o.  Primary Physician:  [name], MD  Chief Complaint: Stress Fracture  Subjective:   Chad [name] is a 59 y.o. very pleasant male patient who presents with the following:  Consulting MD: Dr. Darnell Level  Very pleasant almost 59 year old patient who has lost close to 100 pounds in the last year.  He has gone from being not active at all to being very active, and he is able now to walk about 5 miles at a time.  He is also swimming and being active working out all the time.  He has developed right shin pain significantly worse than the left when he is active.  He thinks that it might hurt more when he stops being active compared to when he is very active.  Nevertheless this is been a relatively new finding for him.  He has not had any trauma or accident.  Past Medical History, Surgical History, Social History, Family History, Problem List, Medications, and Allergies have been reviewed and updated if relevant.  GEN: No fevers, chills. Nontoxic. Primarily MSK c/o today. MSK: Detailed in the HPI GI: tolerating PO intake without difficulty Neuro: No numbness, parasthesias, or tingling associated. Otherwise the pertinent positives of the ROS are noted above.   Objective:   BP 140/66 mmHg  Pulse 64  Temp(Src) 98.3 F (36.8 C) (Oral)  Ht 5' 9"  (1.753 m)  Wt 286 lb 8 oz (129.956 kg)  BMI 42.29 kg/m2   GEN: WDWN, NAD, Non-toxic, Alert & Oriented x 3 HEENT: Atraumatic, Normocephalic.  Ears and Nose: No external deformity. EXTR: No clubbing/cyanosis/edema NEURO: Normal gait.  PSYCH: Normally interactive. Conversant. Not depressed or anxious appearing.  Calm demeanor.    Along the course of the medial tibia, the patient has pain on the right side.  He  has pain to percussion, more distally, as well as in the mid point anteriorly.  Using a tuning fork he has relatively diffuse pain along the bone from approximately one third the way down all the way to the distal tibia.  He does have pain in the region of cortical thickening noted on x-ray.  Left tibia is grossly unremarkable, nontender, and no tenderness with using a tuning fork.  Radiology: Dg Tibia/fibula Left  04/25/2014   CLINICAL DATA:  Pain.  No known injury.  Initial evaluation.  EXAM: LEFT TIBIA AND FIBULA - 2 VIEW  COMPARISON:  03/13/2006.  FINDINGS: No acute bony or joint abnormality identified. Degenerative changes about the left knee and ankle.  IMPRESSION: 1. No acute abnormality. 2. Degenerative changes left knee and ankle.   Electronically Signed   By: Marcello Moores  Register   On: 04/25/2014 09:48   Dg Tibia/fibula Right  04/25/2014   CLINICAL DATA:  Anterior shin pain, evaluate for stress fracture  EXAM: RIGHT TIBIA AND FIBULA - 2 VIEW  COMPARISON:  None.  FINDINGS: The bones of the right tibia and fibula are adequately mineralized. There is subtle cortical thickening-periosteal reaction over the mid tibia anteriorly. No discrete fracture line is demonstrated. The fibula is intact. The observed portions of the right knee exhibit mild degenerative change. The right ankle where visualized is unremarkable.  IMPRESSION: There is subtle cortical thickening over the anterior aspect of the mid right tibia that may reflect a stress reaction.  No discrete fracture line is demonstrated.   Electronically Signed   By: David  Martinique   On: 04/25/2014 09:50    Assessment and Plan:   Stress reaction of tibia, initial encounter   Clinically and radiographically, consistent with stress reaction versus stress fracture of the tibia.  He may have a second stress reaction in the distal tibia.  If he goes on to develop true anterior tibial stress fracture "dreaded black line," these can be problematic and take  significant time to heal or require operative fixation.  I have restricted him on impact activities, but allowed him to do exercise in the pool as much as he would like.  I also placed him on a full length lower extremity Sarmiento pneumatic compression brace on the right.  I appreciate the opportunity to evaluate this very friendly patient. If you have any question regarding her care or prognosis, do not hesitate to ask.   Follow-up: Return in about 1 month (around 05/29/2014).   Signed,  Maud Deed. Hollis Oh, MD   Patient's Medications  New Prescriptions   No medications on file  Previous Medications   ALBUTEROL (PROVENTIL HFA;VENTOLIN HFA) 108 (90 BASE) MCG/ACT INHALER    Inhale 2 puffs into the lungs every 6 (six) hours as needed for wheezing or shortness of breath.   ASPIRIN EC 81 MG TABLET    Take 162 mg by mouth daily.     CARAFATE 1 GM/10ML SUSPENSION    TAKE 10 MLS BY MOUTH 4 TIMES DAILY   DOCUSATE SODIUM 100 MG CAPS    Take 100 mg by mouth daily as needed for mild constipation.   FLAXSEED, LINSEED, (FLAX SEED OIL) 1000 MG CAPS    Take 1 capsule by mouth 2 (two) times daily.     FUROSEMIDE (LASIX) 20 MG TABLET    Take 1 tablet (20 mg total) by mouth daily as needed for edema.   HYDROCODONE-ACETAMINOPHEN (NORCO) 10-325 MG PER TABLET    Take 1 tablet by mouth 3 (three) times daily as needed for moderate pain.   ISOSORBIDE MONONITRATE (IMDUR) 30 MG 24 HR TABLET    Take 1 tablet (30 mg total) by mouth daily.   LEVOTHYROXINE (SYNTHROID, LEVOTHROID) 100 MCG TABLET    Take 1 tablet (100 mcg total) by mouth daily.   MECLIZINE (ANTIVERT) 25 MG TABLET    Take 1 tablet (25 mg total) by mouth 2 (two) times daily as needed for dizziness or nausea.   METOCLOPRAMIDE (REGLAN) 10 MG TABLET    Take 1 tablet (10 mg total) by mouth 3 (three) times daily before meals. NEEDS OFFICE VISIT FOR FURTHER REFILLS   METOPROLOL TARTRATE (LOPRESSOR) 25 MG TABLET    TAKE 1 TABLET BY MOUTH TWICE A DAY   MULTIPLE  VITAMIN (MULITIVITAMIN WITH MINERALS) TABS    Take 1 tablet by mouth daily.   NEXIUM 40 MG CAPSULE    TAKE 1 CAPSULE BY MOUTH TWICE DAILY   NITROGLYCERIN (NITROSTAT) 0.4 MG SL TABLET    Place 1 tablet (0.4 mg total) under the tongue every 5 (five) minutes as needed for chest pain.   OMEGA-3 ACID ETHYL ESTERS (LOVAZA) 1 G CAPSULE    TAKE 2 CAPSULES BY MOUTH TWICE A DAY   ONDANSETRON (ZOFRAN) 4 MG TABLET    Take 1 tab every 6 hours as needed for nausea.   POLYETHYLENE GLYCOL POWDER (GLYCOLAX/MIRALAX) POWDER    Take 17 g by mouth daily.   POTASSIUM CHLORIDE (K-DUR) 10 MEQ TABLET  Take 1 tablet (10 mEq total) by mouth daily as needed.   RED YEAST RICE 600 MG CAPS    Take 2 capsules by mouth daily.   TRAMADOL (ULTRAM) 50 MG TABLET    TAKE 1 TABLET BY MOUTH TWICE A DAY AS NEEDED   TRIAMCINOLONE CREAM (KENALOG) 0.1 %    Apply 1 application topically 2 (two) times daily. Apply to AA.  Modified Medications   No medications on file  Discontinued Medications   No medications on file

## 2014-05-03 ENCOUNTER — Other Ambulatory Visit: Payer: Self-pay | Admitting: Family Medicine

## 2014-05-03 DIAGNOSIS — E113299 Type 2 diabetes mellitus with mild nonproliferative diabetic retinopathy without macular edema, unspecified eye: Secondary | ICD-10-CM

## 2014-05-03 DIAGNOSIS — I10 Essential (primary) hypertension: Secondary | ICD-10-CM

## 2014-05-03 DIAGNOSIS — E785 Hyperlipidemia, unspecified: Secondary | ICD-10-CM

## 2014-05-03 DIAGNOSIS — E039 Hypothyroidism, unspecified: Secondary | ICD-10-CM

## 2014-05-03 DIAGNOSIS — M84369A Stress fracture, unspecified tibia and fibula, initial encounter for fracture: Secondary | ICD-10-CM

## 2014-05-05 ENCOUNTER — Other Ambulatory Visit (INDEPENDENT_AMBULATORY_CARE_PROVIDER_SITE_OTHER): Payer: Medicare Other

## 2014-05-05 DIAGNOSIS — E039 Hypothyroidism, unspecified: Secondary | ICD-10-CM

## 2014-05-05 DIAGNOSIS — E113299 Type 2 diabetes mellitus with mild nonproliferative diabetic retinopathy without macular edema, unspecified eye: Secondary | ICD-10-CM

## 2014-05-05 DIAGNOSIS — E785 Hyperlipidemia, unspecified: Secondary | ICD-10-CM

## 2014-05-05 DIAGNOSIS — E11329 Type 2 diabetes mellitus with mild nonproliferative diabetic retinopathy without macular edema: Secondary | ICD-10-CM

## 2014-05-05 LAB — COMPREHENSIVE METABOLIC PANEL
ALT: 21 U/L (ref 0–53)
AST: 32 U/L (ref 0–37)
Albumin: 3.9 g/dL (ref 3.5–5.2)
Alkaline Phosphatase: 162 U/L — ABNORMAL HIGH (ref 39–117)
BILIRUBIN TOTAL: 1.1 mg/dL (ref 0.2–1.2)
BUN: 12 mg/dL (ref 6–23)
CO2: 29 meq/L (ref 19–32)
Calcium: 9.2 mg/dL (ref 8.4–10.5)
Chloride: 101 mEq/L (ref 96–112)
Creatinine, Ser: 0.9 mg/dL (ref 0.40–1.50)
GFR: 91.74 mL/min (ref >60.00)
GLUCOSE: 153 mg/dL — AB (ref 70–99)
Potassium: 3.8 mEq/L (ref 3.5–5.1)
SODIUM: 135 meq/L (ref 135–145)
Total Protein: 6.2 g/dL (ref 6.0–8.3)

## 2014-05-05 LAB — LIPID PANEL
CHOLESTEROL: 142 mg/dL (ref 0–200)
HDL: 33.9 mg/dL — ABNORMAL LOW (ref >39.00)
LDL CALC: 89 mg/dL (ref 0–99)
NonHDL: 108.1
Total CHOL/HDL Ratio: 4
Triglycerides: 96 mg/dL (ref 0.0–149.0)
VLDL: 19.2 mg/dL (ref 0.0–40.0)

## 2014-05-05 LAB — MICROALBUMIN / CREATININE URINE RATIO
CREATININE, U: 30.4 mg/dL
MICROALB/CREAT RATIO: 2.3 mg/g (ref 0.0–30.0)
Microalb, Ur: <0.7 mg/dL (ref 0.0–1.9)

## 2014-05-05 LAB — HEMOGLOBIN A1C: HEMOGLOBIN A1C: 5.3 % (ref 4.6–6.5)

## 2014-05-05 LAB — TSH: TSH: 3.29 u[IU]/mL (ref 0.35–4.50)

## 2014-05-06 ENCOUNTER — Other Ambulatory Visit: Payer: Self-pay

## 2014-05-06 MED ORDER — HYDROCODONE-ACETAMINOPHEN 10-325 MG PO TABS: 1.0000 | ORAL_TABLET | Freq: Three times a day (TID) | ORAL | Status: DC | PRN

## 2014-05-06 NOTE — Telephone Encounter (Signed)
Message left notifying patient and Rx placed up front for pick up.

## 2014-05-06 NOTE — Telephone Encounter (Signed)
Pt's wife left v/m requesting rx hydrocodone apap. Call when ready for pick up. Pt last seen 05/05/14.

## 2014-05-06 NOTE — Telephone Encounter (Signed)
Printed and in Kim's box. A bit early but plz rpt UDS when pt picks up.

## 2014-05-08 ENCOUNTER — Encounter: Payer: Self-pay | Admitting: Family Medicine

## 2014-05-08 ENCOUNTER — Encounter: Payer: Self-pay | Admitting: *Deleted

## 2014-05-22 ENCOUNTER — Other Ambulatory Visit: Payer: Self-pay | Admitting: Family Medicine

## 2014-05-28 ENCOUNTER — Ambulatory Visit (INDEPENDENT_AMBULATORY_CARE_PROVIDER_SITE_OTHER): Payer: Medicare Other | Admitting: Family Medicine

## 2014-05-28 ENCOUNTER — Encounter: Payer: Self-pay | Admitting: Family Medicine

## 2014-05-28 VITALS — BP 124/68 | HR 57 | Temp 97.5°F | Ht 69.0 in | Wt 285.8 lb

## 2014-05-28 DIAGNOSIS — I2510 Atherosclerotic heart disease of native coronary artery without angina pectoris: Secondary | ICD-10-CM

## 2014-05-28 DIAGNOSIS — M84369D Stress fracture, unspecified tibia and fibula, subsequent encounter for fracture with routine healing: Secondary | ICD-10-CM

## 2014-05-28 NOTE — Progress Notes (Signed)
Dr. Frederico Hamman T. Jennice Renegar, MD, [facility] [place], [address] Phone: [phone] Fax: [phone]  05/28/2014  Patient: Chad [name], MRN: [id number], DOB: [birth year], 59 y.o.  Primary Physician:  [name], MD  Chief Complaint: Follow-up  Subjective:   Chad [name] is a 59 y.o. very pleasant male patient who presents with the following:  He is here to f/u r tibial stress fx and radiographic evidence and has been wearing a Sarmiento-type full length aircast, and he has been very compliant. No impact at all and his tibia is now completely pain free. He d/c his aircast 2 days ago.  04/28/2014 Last OV with Owens Loffler, MD  Consulting MD: Dr. Darnell Level  Very pleasant almost 59 year old patient who has lost close to 100 pounds in the last year.  He has gone from being not active at all to being very active, and he is able now to walk about 5 miles at a time.  He is also swimming and being active working out all the time.  He has developed right shin pain significantly worse than the left when he is active.  He thinks that it might hurt more when he stops being active compared to when he is very active.  Nevertheless this is been a relatively new finding for him.  He has not had any trauma or accident.  Past Medical History, Surgical History, Social History, Family History, Problem List, Medications, and Allergies have been reviewed and updated if relevant.  GEN: No fevers, chills. Nontoxic. Primarily MSK c/o today. MSK: Detailed in the HPI GI: tolerating PO intake without difficulty Neuro: No numbness, parasthesias, or tingling associated. Otherwise the pertinent positives of the ROS are noted above.   Objective:   BP 124/68 mmHg  Pulse 57  Temp(Src) 97.5 F (36.4 C) (Oral)  Ht 5' 9"  (1.753 m)  Wt 285 lb 12.8 oz (129.638 kg)  BMI 42.19 kg/m2  SpO2 96%   GEN: WDWN, NAD, Non-toxic, Alert & Oriented x 3 HEENT:  Atraumatic, Normocephalic.  Ears and Nose: No external deformity. EXTR: No clubbing/cyanosis/edema NEURO: Normal gait.  PSYCH: Normally interactive. Conversant. Not depressed or anxious appearing.  Calm demeanor.    Compared to last examination the patient's tibia is nontender throughout.  I aggressively palpated along the tibia medially and anteriorly and used a tuning fork, none of which provoked pain.  He is walking now without any kind of pain at all.  Radiology: No results found.  Assessment and Plan:   Stress reaction of tibia, with routine healing, subsequent encounter   He has done great.  Resume physical activity, slowly and progress 10% weekly volume increase  Follow-up: prn   Signed,  Amairany Schumpert T. Kaela Beitz, MD   Patient's Medications  New Prescriptions   No medications on file  Previous Medications   ALBUTEROL (PROVENTIL HFA;VENTOLIN HFA) 108 (90 BASE) MCG/ACT INHALER    Inhale 2 puffs into the lungs every 6 (six) hours as needed for wheezing or shortness of breath.   ASPIRIN EC 81 MG TABLET    Take 162 mg by mouth daily.     CARAFATE 1 GM/10ML SUSPENSION    TAKE 10ML BY MOUTH 4 TIMES A DAY   DOCUSATE SODIUM 100 MG CAPS    Take 100 mg by mouth daily as needed for mild constipation.   FLAXSEED, LINSEED, (FLAX SEED OIL) 1000 MG CAPS    Take 1 capsule by mouth 2 (two)  times daily.     FUROSEMIDE (LASIX) 20 MG TABLET    Take 1 tablet (20 mg total) by mouth daily as needed for edema.   HYDROCODONE-ACETAMINOPHEN (NORCO) 10-325 MG PER TABLET    Take 1 tablet by mouth 3 (three) times daily as needed for moderate pain.   ISOSORBIDE MONONITRATE (IMDUR) 30 MG 24 HR TABLET    Take 1 tablet (30 mg total) by mouth daily.   LEVOTHYROXINE (SYNTHROID, LEVOTHROID) 100 MCG TABLET    Take 1 tablet (100 mcg total) by mouth daily.   MECLIZINE (ANTIVERT) 25 MG TABLET    Take 1 tablet (25 mg total) by mouth 2 (two) times daily as needed for dizziness or nausea.   METOCLOPRAMIDE (REGLAN) 10 MG  TABLET    Take 1 tablet (10 mg total) by mouth 3 (three) times daily before meals. NEEDS OFFICE VISIT FOR FURTHER REFILLS   METOPROLOL TARTRATE (LOPRESSOR) 25 MG TABLET    TAKE 1 TABLET BY MOUTH TWICE A DAY   MULTIPLE VITAMIN (MULITIVITAMIN WITH MINERALS) TABS    Take 1 tablet by mouth daily.   NEXIUM 40 MG CAPSULE    TAKE 1 CAPSULE BY MOUTH TWICE DAILY   NITROGLYCERIN (NITROSTAT) 0.4 MG SL TABLET    Place 1 tablet (0.4 mg total) under the tongue every 5 (five) minutes as needed for chest pain.   OMEGA-3 ACID ETHYL ESTERS (LOVAZA) 1 G CAPSULE    TAKE 2 CAPSULES BY MOUTH TWICE A DAY   ONDANSETRON (ZOFRAN) 4 MG TABLET    Take 1 tab every 6 hours as needed for nausea.   POLYETHYLENE GLYCOL POWDER (GLYCOLAX/MIRALAX) POWDER    Take 17 g by mouth daily.   POTASSIUM CHLORIDE (K-DUR) 10 MEQ TABLET    Take 1 tablet (10 mEq total) by mouth daily as needed.   RED YEAST RICE 600 MG CAPS    Take 2 capsules by mouth daily.   TRAMADOL (ULTRAM) 50 MG TABLET    TAKE 1 TABLET BY MOUTH TWICE A DAY AS NEEDED   TRIAMCINOLONE CREAM (KENALOG) 0.1 %    Apply 1 application topically 2 (two) times daily. Apply to AA.  Modified Medications   No medications on file  Discontinued Medications   No medications on file

## 2014-05-28 NOTE — Progress Notes (Signed)
Pre visit review using our clinic review tool, if applicable. No additional management support is needed unless otherwise documented below in the visit note. 

## 2014-06-05 ENCOUNTER — Other Ambulatory Visit: Payer: Self-pay

## 2014-06-05 MED ORDER — HYDROCODONE-ACETAMINOPHEN 10-325 MG PO TABS: 1.0000 | ORAL_TABLET | Freq: Three times a day (TID) | ORAL | Status: DC | PRN

## 2014-06-05 NOTE — Telephone Encounter (Signed)
Patient notified and Rx placed up front for pick up.

## 2014-06-05 NOTE — Telephone Encounter (Signed)
Mrs Bartell left v/m requesting rx hydrocodone apap. Call when ready for pick up. Pt last saw Dr Darnell Level on 04/25/2014.

## 2014-06-05 NOTE — Telephone Encounter (Signed)
Printed and in Kim's box. Due for rpt UDS. Not done last month.

## 2014-06-16 ENCOUNTER — Telehealth: Payer: Self-pay

## 2014-06-16 NOTE — Telephone Encounter (Signed)
[place] left v/m requesting refill metoclopramide. Pt last seen 04/25/14.Please advise.

## 2014-06-17 ENCOUNTER — Other Ambulatory Visit: Payer: Self-pay | Admitting: *Deleted

## 2014-06-17 MED ORDER — METOCLOPRAMIDE HCL 10 MG PO TABS: 10.0000 mg | ORAL_TABLET | Freq: Three times a day (TID) | ORAL | Status: DC

## 2014-06-17 NOTE — Telephone Encounter (Signed)
Last filled #90 on 04/18/14, OV was needed.  Patient was seen 05/05/14 and has a follow up scheduled for July.

## 2014-06-18 DIAGNOSIS — M7061 Trochanteric bursitis, right hip: Secondary | ICD-10-CM | POA: Diagnosis not present

## 2014-07-03 ENCOUNTER — Telehealth: Payer: Self-pay | Admitting: Family Medicine

## 2014-07-03 MED ORDER — HYDROCODONE-ACETAMINOPHEN 10-325 MG PO TABS: 1.0000 | ORAL_TABLET | Freq: Three times a day (TID) | ORAL | Status: DC | PRN

## 2014-07-03 NOTE — Telephone Encounter (Signed)
Pt's wife called wants refill on HYDROcodone-acetaminophen (NORCO) 10-325 MG per tablet

## 2014-07-03 NOTE — Telephone Encounter (Signed)
[name] notified and Rx placed up front for pick up.

## 2014-07-03 NOTE — Telephone Encounter (Signed)
Printed and in Kim's box.

## 2014-07-31 ENCOUNTER — Other Ambulatory Visit: Payer: Self-pay

## 2014-07-31 NOTE — Telephone Encounter (Signed)
Pt left v/m requesting rx hydrocodone apap. Call when ready for pick up. Last seen 04/25/14 and rx last printed # 60 on 07/03/14.

## 2014-08-01 MED ORDER — HYDROCODONE-ACETAMINOPHEN 10-325 MG PO TABS: 1.0000 | ORAL_TABLET | Freq: Three times a day (TID) | ORAL | Status: DC | PRN

## 2014-08-01 NOTE — Telephone Encounter (Signed)
Message left advising patient and Rx placed up front for pick up.

## 2014-08-01 NOTE — Telephone Encounter (Signed)
Printed and in Kim's box.

## 2014-08-10 ENCOUNTER — Encounter (HOSPITAL_COMMUNITY): Payer: Self-pay | Admitting: *Deleted

## 2014-08-10 ENCOUNTER — Emergency Department (HOSPITAL_COMMUNITY): Payer: Medicare Other

## 2014-08-10 ENCOUNTER — Emergency Department (HOSPITAL_COMMUNITY)
Admission: EM | Admit: 2014-08-10 | Discharge: 2014-08-10 | Disposition: A | Discharge status: Home / Self Care | Payer: Medicare Other | Attending: Emergency Medicine | Admitting: Emergency Medicine

## 2014-08-10 DIAGNOSIS — R1032 Left lower quadrant pain: Secondary | ICD-10-CM | POA: Diagnosis not present

## 2014-08-10 DIAGNOSIS — E039 Hypothyroidism, unspecified: Secondary | ICD-10-CM | POA: Diagnosis not present

## 2014-08-10 DIAGNOSIS — Z9049 Acquired absence of other specified parts of digestive tract: Secondary | ICD-10-CM | POA: Insufficient documentation

## 2014-08-10 DIAGNOSIS — Z79899 Other long term (current) drug therapy: Secondary | ICD-10-CM | POA: Diagnosis not present

## 2014-08-10 DIAGNOSIS — K219 Gastro-esophageal reflux disease without esophagitis: Secondary | ICD-10-CM | POA: Diagnosis not present

## 2014-08-10 DIAGNOSIS — E785 Hyperlipidemia, unspecified: Secondary | ICD-10-CM | POA: Insufficient documentation

## 2014-08-10 DIAGNOSIS — Z9889 Other specified postprocedural states: Secondary | ICD-10-CM | POA: Insufficient documentation

## 2014-08-10 DIAGNOSIS — Z7982 Long term (current) use of aspirin: Secondary | ICD-10-CM | POA: Diagnosis not present

## 2014-08-10 DIAGNOSIS — K5732 Diverticulitis of large intestine without perforation or abscess without bleeding: Secondary | ICD-10-CM | POA: Diagnosis not present

## 2014-08-10 DIAGNOSIS — H903 Sensorineural hearing loss, bilateral: Secondary | ICD-10-CM | POA: Diagnosis not present

## 2014-08-10 DIAGNOSIS — R1030 Lower abdominal pain, unspecified: Secondary | ICD-10-CM | POA: Diagnosis present

## 2014-08-10 DIAGNOSIS — M1990 Unspecified osteoarthritis, unspecified site: Secondary | ICD-10-CM | POA: Insufficient documentation

## 2014-08-10 DIAGNOSIS — E119 Type 2 diabetes mellitus without complications: Secondary | ICD-10-CM | POA: Diagnosis not present

## 2014-08-10 DIAGNOSIS — R1010 Upper abdominal pain, unspecified: Secondary | ICD-10-CM | POA: Diagnosis not present

## 2014-08-10 DIAGNOSIS — Z7952 Long term (current) use of systemic steroids: Secondary | ICD-10-CM | POA: Insufficient documentation

## 2014-08-10 DIAGNOSIS — I10 Essential (primary) hypertension: Secondary | ICD-10-CM | POA: Diagnosis not present

## 2014-08-10 DIAGNOSIS — K5792 Diverticulitis of intestine, part unspecified, without perforation or abscess without bleeding: Secondary | ICD-10-CM

## 2014-08-10 DIAGNOSIS — K5790 Diverticulosis of intestine, part unspecified, without perforation or abscess without bleeding: Secondary | ICD-10-CM | POA: Insufficient documentation

## 2014-08-10 DIAGNOSIS — I5032 Chronic diastolic (congestive) heart failure: Secondary | ICD-10-CM | POA: Insufficient documentation

## 2014-08-10 DIAGNOSIS — R1013 Epigastric pain: Secondary | ICD-10-CM | POA: Diagnosis not present

## 2014-08-10 DIAGNOSIS — Z86010 Personal history of colonic polyps: Secondary | ICD-10-CM | POA: Insufficient documentation

## 2014-08-10 LAB — LIPASE, BLOOD: Lipase: 30 U/L (ref 22–51)

## 2014-08-10 LAB — URINALYSIS, ROUTINE W REFLEX MICROSCOPIC
Bilirubin Urine: NEGATIVE
Glucose, UA: NEGATIVE mg/dL
HGB URINE DIPSTICK: NEGATIVE
Ketones, ur: NEGATIVE mg/dL
NITRITE: NEGATIVE
PROTEIN: NEGATIVE mg/dL
Specific Gravity, Urine: 1.022 (ref 1.005–1.030)
Urobilinogen, UA: 0.2 mg/dL (ref 0.0–1.0)
pH: 5.5 (ref 5.0–8.0)

## 2014-08-10 LAB — URINE MICROSCOPIC-ADD ON

## 2014-08-10 LAB — CBC WITH DIFFERENTIAL/PLATELET
BASOS PCT: 0 % (ref 0–1)
Basophils Absolute: 0 10*3/uL (ref 0.0–0.1)
EOS ABS: 0.1 10*3/uL (ref 0.0–0.7)
EOS PCT: 1 % (ref 0–5)
HCT: 41.3 % (ref 39.0–52.0)
HEMOGLOBIN: 14 g/dL (ref 13.0–17.0)
LYMPHS ABS: 0.6 10*3/uL — AB (ref 0.7–4.0)
Lymphocytes Relative: 8 % — ABNORMAL LOW (ref 12–46)
MCH: 28 pg (ref 26.0–34.0)
MCHC: 33.9 g/dL (ref 30.0–36.0)
MCV: 82.6 fL (ref 78.0–100.0)
Monocytes Absolute: 0.9 10*3/uL (ref 0.1–1.0)
Monocytes Relative: 10 % (ref 3–12)
NEUTROS PCT: 81 % — AB (ref 43–77)
Neutro Abs: 7 10*3/uL (ref 1.7–7.7)
Platelets: 123 10*3/uL — ABNORMAL LOW (ref 150–400)
RBC: 5 MIL/uL (ref 4.22–5.81)
RDW: 14.2 % (ref 11.5–15.5)
WBC: 8.6 10*3/uL (ref 4.0–10.5)

## 2014-08-10 LAB — COMPREHENSIVE METABOLIC PANEL
ALBUMIN: 3.7 g/dL (ref 3.5–5.0)
ALT: 43 U/L (ref 17–63)
ANION GAP: 9 (ref 5–15)
AST: 57 U/L — ABNORMAL HIGH (ref 15–41)
Alkaline Phosphatase: 201 U/L — ABNORMAL HIGH (ref 38–126)
BUN: 12 mg/dL (ref 6–20)
CO2: 26 mmol/L (ref 22–32)
Calcium: 8.7 mg/dL — ABNORMAL LOW (ref 8.9–10.3)
Chloride: 102 mmol/L (ref 101–111)
Creatinine, Ser: 1.14 mg/dL (ref 0.61–1.24)
GFR calc Af Amer: >60 mL/min (ref >60)
GFR calc non Af Amer: >60 mL/min (ref >60)
GLUCOSE: 152 mg/dL — AB (ref 65–99)
Potassium: 3.7 mmol/L (ref 3.5–5.1)
Sodium: 137 mmol/L (ref 135–145)
TOTAL PROTEIN: 6.7 g/dL (ref 6.5–8.1)
Total Bilirubin: 1 mg/dL (ref 0.3–1.2)

## 2014-08-10 MED ORDER — ONDANSETRON HCL 4 MG/2ML IJ SOLN
4.0000 mg | Freq: Once | INTRAMUSCULAR | Status: AC
  Administered 2014-08-10: 4 mg via INTRAVENOUS
  Filled 2014-08-10: qty 2

## 2014-08-10 MED ORDER — IOHEXOL 300 MG/ML  SOLN
100.0000 mL | Freq: Once | INTRAMUSCULAR | Status: AC | PRN
  Administered 2014-08-10: 100 mL via INTRAVENOUS

## 2014-08-10 MED ORDER — MORPHINE SULFATE 4 MG/ML IJ SOLN
4.0000 mg | Freq: Once | INTRAMUSCULAR | Status: AC
  Administered 2014-08-10: 4 mg via INTRAVENOUS
  Filled 2014-08-10: qty 1

## 2014-08-10 MED ORDER — SODIUM CHLORIDE 0.9 % IV BOLUS (SEPSIS)
1000.0000 mL | Freq: Once | INTRAVENOUS | Status: DC
  Administered 2014-08-10: 1000 mL via INTRAVENOUS

## 2014-08-10 MED ORDER — METRONIDAZOLE 500 MG PO TABS: 500.0000 mg | ORAL_TABLET | Freq: Two times a day (BID) | ORAL | Status: DC

## 2014-08-10 MED ORDER — OXYCODONE-ACETAMINOPHEN 5-325 MG PO TABS: 1.0000 | ORAL_TABLET | Freq: Four times a day (QID) | ORAL | Status: DC | PRN

## 2014-08-10 MED ORDER — ONDANSETRON HCL 4 MG PO TABS: 4.0000 mg | ORAL_TABLET | Freq: Four times a day (QID) | ORAL | Status: DC

## 2014-08-10 MED ORDER — CIPROFLOXACIN HCL 500 MG PO TABS: 500.0000 mg | ORAL_TABLET | Freq: Two times a day (BID) | ORAL | Status: DC

## 2014-08-10 MED ORDER — IOHEXOL 300 MG/ML  SOLN
25.0000 mL | Freq: Once | INTRAMUSCULAR | Status: AC | PRN
  Administered 2014-08-10: 25 mL via ORAL

## 2014-08-10 MED ORDER — CIPROFLOXACIN HCL 500 MG PO TABS
500.0000 mg | ORAL_TABLET | Freq: Once | ORAL | Status: AC
  Administered 2014-08-10: 500 mg via ORAL
  Filled 2014-08-10: qty 1

## 2014-08-10 MED ORDER — OXYCODONE-ACETAMINOPHEN 5-325 MG PO TABS
2.0000 | ORAL_TABLET | Freq: Once | ORAL | Status: AC
  Administered 2014-08-10: 2 via ORAL
  Filled 2014-08-10: qty 2

## 2014-08-10 MED ORDER — METRONIDAZOLE 500 MG PO TABS
500.0000 mg | ORAL_TABLET | Freq: Once | ORAL | Status: AC
  Administered 2014-08-10: 500 mg via ORAL
  Filled 2014-08-10: qty 1

## 2014-08-10 NOTE — Discharge Instructions (Signed)
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
## 2014-08-10 NOTE — ED Notes (Signed)
The pt is c /o lower abd pain for 3 days with some nausea

## 2014-08-15 ENCOUNTER — Ambulatory Visit (INDEPENDENT_AMBULATORY_CARE_PROVIDER_SITE_OTHER): Payer: Medicare Other | Admitting: Family Medicine

## 2014-08-15 ENCOUNTER — Encounter: Payer: Self-pay | Admitting: Family Medicine

## 2014-08-15 VITALS — BP 124/66 | HR 55 | Temp 97.8°F | Wt 275.8 lb

## 2014-08-15 DIAGNOSIS — K5792 Diverticulitis of intestine, part unspecified, without perforation or abscess without bleeding: Secondary | ICD-10-CM

## 2014-08-15 DIAGNOSIS — I2510 Atherosclerotic heart disease of native coronary artery without angina pectoris: Secondary | ICD-10-CM | POA: Diagnosis not present

## 2014-08-15 HISTORY — DX: Diverticulitis of intestine, part unspecified, without perforation or abscess without bleeding: K57.92

## 2014-08-15 NOTE — Progress Notes (Signed)
BP 124/66 mmHg  Pulse 55  Temp(Src) 97.8 F (36.6 C) (Oral)  Wt 275 lb 12 oz (125.079 kg)  SpO2 97%   CC: ER f/u visit  Subjective:    Patient ID: Chad [name], male    DOB: 03-Aug-1955, 59 y.o.   MRN: [id number]  HPI: Chad [name] is a 59 y.o. male presenting on 08/15/2014 for Hospitalization Follow-up   Reviewed ER records - seen at ER 08/10/2014 with diverticulitis confirmed on CT scan. Treated with cipro/flagyl along with oxycodone for pain and zofran for nausea. Initially improving but then again Tuesday started feeling worse - worsening lower abd pain described as sharp pain with nausea/vomiting yesterday. Ate bowl of corn flakes this morning. No blood in stool. No known fevers.   Given morphine in ER - this caused constipation. Self treated with liquid magnesium yesterday. Bowels moved yesterday and today.   Compliant with nexium and reglan. Has not taken carafate recently.  Relevant past medical, surgical, family and social history reviewed and updated as indicated. Interim medical history since our last visit reviewed. Allergies and medications reviewed and updated. Current Outpatient Prescriptions on File Prior to Visit  Medication Sig  . albuterol (PROVENTIL HFA;VENTOLIN HFA) 108 (90 BASE) MCG/ACT inhaler Inhale 2 puffs into the lungs every 6 (six) hours as needed for wheezing or shortness of breath.  Marland Kitchen aspirin EC 81 MG tablet Take 162 mg by mouth daily.    Marland Kitchen CARAFATE 1 GM/10ML suspension TAKE 10ML BY MOUTH 4 TIMES A DAY (Patient taking differently: TAKE 10ML BY MOUTH 4 TIMES DAILYU AS NEEDED FOR STOMACH INDIGESTION)  . ciprofloxacin (CIPRO) 500 MG tablet Take 1 tablet (500 mg total) by mouth 2 (two) times daily.  Marland Kitchen docusate sodium 100 MG CAPS Take 100 mg by mouth daily as needed for mild constipation.  . Flaxseed, Linseed, (FLAX SEED OIL) 1000 MG CAPS Take 1 capsule by mouth 2 (two) times daily.    . furosemide (LASIX) 20 MG tablet Take 1 tablet (20 mg total) by mouth  daily as needed for edema.  Marland Kitchen HYDROcodone-acetaminophen (NORCO) 10-325 MG per tablet Take 1 tablet by mouth 3 (three) times daily as needed for moderate pain.  Marland Kitchen levothyroxine (SYNTHROID, LEVOTHROID) 100 MCG tablet Take 1 tablet (100 mcg total) by mouth daily.  . meclizine (ANTIVERT) 25 MG tablet Take 1 tablet (25 mg total) by mouth 2 (two) times daily as needed for dizziness or nausea.  . metoCLOPramide (REGLAN) 10 MG tablet Take 1 tablet (10 mg total) by mouth 3 (three) times daily before meals. NEEDS OFFICE VISIT FOR FURTHER REFILLS  . metoprolol tartrate (LOPRESSOR) 25 MG tablet TAKE 1 TABLET BY MOUTH TWICE A DAY  . metroNIDAZOLE (FLAGYL) 500 MG tablet Take 1 tablet (500 mg total) by mouth 2 (two) times daily.  . Multiple Vitamin (MULITIVITAMIN WITH MINERALS) TABS Take 1 tablet by mouth daily.  Marland Kitchen NEXIUM 40 MG capsule TAKE 1 CAPSULE BY MOUTH TWICE DAILY  . nitroGLYCERIN (NITROSTAT) 0.4 MG SL tablet Place 1 tablet (0.4 mg total) under the tongue every 5 (five) minutes as needed for chest pain.  Marland Kitchen omega-3 acid ethyl esters (LOVAZA) 1 G capsule TAKE 2 CAPSULES BY MOUTH TWICE A DAY (Patient taking differently: TAKE 1 CAPSULE BY MOUTH TWICE A DAY)  . ondansetron (ZOFRAN) 4 MG tablet Take 1 tablet (4 mg total) by mouth every 6 (six) hours.  Marland Kitchen oxyCODONE-acetaminophen (PERCOCET/ROXICET) 5-325 MG per tablet Take 1-2 tablets by mouth every 6 (six) hours  as needed.  . polyethylene glycol powder (GLYCOLAX/MIRALAX) powder Take 17 g by mouth daily.  . potassium chloride (K-DUR) 10 MEQ tablet Take 10 mEq by mouth daily as needed (ONLY WHEN TAKING FUROSEMIDE).   . Red Yeast Rice 600 MG CAPS Take 1 capsule by mouth 2 (two) times daily.   . traMADol (ULTRAM) 50 MG tablet TAKE 1 TABLET BY MOUTH TWICE A DAY AS NEEDED  . triamcinolone cream (KENALOG) 0.1 % Apply 1 application topically 2 (two) times daily. Apply to AA.   No current facility-administered medications on file prior to visit.    Review of  Systems Per HPI unless specifically indicated above     Objective:    BP 124/66 mmHg  Pulse 55  Temp(Src) 97.8 F (36.6 C) (Oral)  Wt 275 lb 12 oz (125.079 kg)  SpO2 97%  Wt Readings from Last 3 Encounters:  08/15/14 275 lb 12 oz (125.079 kg)  08/10/14 279 lb 5 oz (126.695 kg)  05/28/14 285 lb 12.8 oz (129.638 kg)    Physical Exam  Constitutional: He appears well-developed and well-nourished. No distress.  HENT:  Mouth/Throat: Oropharynx is clear and moist. No oropharyngeal exudate.  Cardiovascular: Normal rate, regular rhythm, normal heart sounds and intact distal pulses.   No murmur heard. Pulmonary/Chest: Effort normal and breath sounds normal. No respiratory distress. He has no wheezes. He has no rales.  Abdominal: Soft. Bowel sounds are normal. He exhibits no distension and no mass. There is no hepatosplenomegaly. There is tenderness (moderate) in the right lower quadrant, suprapubic area and left lower quadrant. There is guarding (mild). There is no rigidity, no rebound and negative Murphy's sign.  obese  Musculoskeletal: He exhibits no edema.  Skin: Skin is warm and dry. No rash noted.  Nursing note and vitals reviewed.  Results for orders placed or performed during the hospital encounter of 08/10/14  CBC with Differential  Result Value Ref Range   WBC 8.6 4.0 - 10.5 K/uL   RBC 5.00 4.22 - 5.81 MIL/uL   Hemoglobin 14.0 13.0 - 17.0 g/dL   HCT 41.3 39.0 - 52.0 %   MCV 82.6 78.0 - 100.0 fL   MCH 28.0 26.0 - 34.0 pg   MCHC 33.9 30.0 - 36.0 g/dL   RDW 14.2 11.5 - 15.5 %   Platelets 123 (L) 150 - 400 K/uL   Neutrophils Relative % 81 (H) 43 - 77 %   Neutro Abs 7.0 1.7 - 7.7 K/uL   Lymphocytes Relative 8 (L) 12 - 46 %   Lymphs Abs 0.6 (L) 0.7 - 4.0 K/uL   Monocytes Relative 10 3 - 12 %   Monocytes Absolute 0.9 0.1 - 1.0 K/uL   Eosinophils Relative 1 0 - 5 %   Eosinophils Absolute 0.1 0.0 - 0.7 K/uL   Basophils Relative 0 0 - 1 %   Basophils Absolute 0.0 0.0 - 0.1  K/uL  Comprehensive metabolic panel  Result Value Ref Range   Sodium 137 135 - 145 mmol/L   Potassium 3.7 3.5 - 5.1 mmol/L   Chloride 102 101 - 111 mmol/L   CO2 26 22 - 32 mmol/L   Glucose, Bld 152 (H) 65 - 99 mg/dL   BUN 12 6 - 20 mg/dL   Creatinine, Ser 1.14 0.61 - 1.24 mg/dL   Calcium 8.7 (L) 8.9 - 10.3 mg/dL   Total Protein 6.7 6.5 - 8.1 g/dL   Albumin 3.7 3.5 - 5.0 g/dL   AST 57 (H) 15 -  41 U/L   ALT 43 17 - 63 U/L   Alkaline Phosphatase 201 (H) 38 - 126 U/L   Total Bilirubin 1.0 0.3 - 1.2 mg/dL   GFR calc non Af Amer >60 >60 mL/min   GFR calc Af Amer >60 >60 mL/min   Anion gap 9 5 - 15  Lipase, blood  Result Value Ref Range   Lipase 30 22 - 51 U/L  Urinalysis, Routine w reflex microscopic (not at [facility])  Result Value Ref Range   Color, Urine YELLOW YELLOW   APPearance CLEAR CLEAR   Specific Gravity, Urine 1.022 1.005 - 1.030   pH 5.5 5.0 - 8.0   Glucose, UA NEGATIVE NEGATIVE mg/dL   Hgb urine dipstick NEGATIVE NEGATIVE   Bilirubin Urine NEGATIVE NEGATIVE   Ketones, ur NEGATIVE NEGATIVE mg/dL   Protein, ur NEGATIVE NEGATIVE mg/dL   Urobilinogen, UA 0.2 0.0 - 1.0 mg/dL   Nitrite NEGATIVE NEGATIVE   Leukocytes, UA SMALL (A) NEGATIVE  Urine microscopic-add on  Result Value Ref Range   Squamous Epithelial / LPF RARE RARE   WBC, UA 0-2 <3 WBC/hpf   RBC / HPF 0-2 <3 RBC/hpf   Bacteria, UA RARE RARE      Assessment & Plan:   Problem List Items Addressed This Visit    Acute diverticulitis - Primary    Persistent sxs despite 5d cipro/flagyl course. I think this is from too rapid transition to normal diet vs GERD exacerbation vs med related (abx, oxycodone).  No fevers, doubt diverticulitis complication like abscess/perf.  Rec continue abx, rec adherence to clear liquid diet, and rec stop oxycodone which may be exacerbating GI distress. Discussed red flags to seek urgent care at ER over weekend - worsening abd pain, nausea/vomiting, fever. If no improvement over weekend,  will obtain abd CT scan.          Follow up plan: Return if symptoms worsen or fail to improve.

## 2014-08-15 NOTE — Progress Notes (Signed)
Pre visit review using our clinic review tool, if applicable. No additional management support is needed unless otherwise documented below in the visit note. 

## 2014-08-15 NOTE — Assessment & Plan Note (Addendum)
Persistent sxs despite 5d cipro/flagyl course. I think this is from too rapid transition to normal diet vs GERD exacerbation vs med related (abx, oxycodone).  No fevers, doubt diverticulitis complication like abscess/perf.  Rec continue abx, rec adherence to clear liquid diet, and rec stop oxycodone which may be exacerbating GI distress. Discussed red flags to seek urgent care at ER over weekend - worsening abd pain, nausea/vomiting, fever. If no improvement over weekend, will obtain abd CT scan.

## 2014-08-15 NOTE — Patient Instructions (Addendum)
Clear liquid diet. Stop oxycodone, just use hydrocodone for pain as needed.  If worsening over weekend or fever, seek care back at ER.  Call me with an update on Monday - if no better we will get another CT scan.

## 2014-08-18 ENCOUNTER — Telehealth: Payer: Self-pay

## 2014-08-18 ENCOUNTER — Telehealth: Payer: Self-pay | Admitting: *Deleted

## 2014-08-18 ENCOUNTER — Ambulatory Visit
Admission: RE | Admit: 2014-08-18 | Discharge: 2014-08-18 | Disposition: A | Discharge status: Home / Self Care | Payer: Medicare Other | Source: Ambulatory Visit | Attending: Family Medicine | Admitting: Family Medicine

## 2014-08-18 DIAGNOSIS — R161 Splenomegaly, not elsewhere classified: Secondary | ICD-10-CM | POA: Diagnosis not present

## 2014-08-18 DIAGNOSIS — K5732 Diverticulitis of large intestine without perforation or abscess without bleeding: Secondary | ICD-10-CM

## 2014-08-18 HISTORY — DX: Type 2 diabetes mellitus without complications: E11.9

## 2014-08-18 MED ORDER — IOHEXOL 300 MG/ML  SOLN
125.0000 mL | Freq: Once | INTRAMUSCULAR | Status: AC | PRN
  Administered 2014-08-18: 125 mL via INTRAVENOUS

## 2014-08-18 MED ORDER — IOHEXOL 240 MG/ML SOLN
25.0000 mL | INTRAMUSCULAR | Status: AC
  Administered 2014-08-18: 25 mL via ORAL

## 2014-08-18 NOTE — Telephone Encounter (Signed)
Noted, thanks.  Await CT results.  Routed to PCP as FYI.

## 2014-08-18 NOTE — Telephone Encounter (Signed)
See result note.  

## 2014-08-18 NOTE — Telephone Encounter (Signed)
Ebony Hail with [facility] CT called with call report on patient. Results in chart.

## 2014-08-18 NOTE — Telephone Encounter (Signed)
Chad [name] left v/m; pt was seen 08/15/14 and has been on liquid diet but continuing with very bad stomach pains and request further instructions.Please advise.

## 2014-08-18 NOTE — Telephone Encounter (Signed)
Dr. Darnell Level specifically mentioned- If no improvement over weekend, will obtain abd CT scan. If he is worse, then to ER.  If just not improved, then would get CT set up.  I went ahead and ordered the CT.   Routed to both [place] and [facility] (with RL covering for LF)

## 2014-08-18 NOTE — Telephone Encounter (Signed)
Patient notified as instructed by telephone and verbalized understanding. Patient stated that the pain eased up over the weekend and started back today. Patient stated that the pain is not as bad as it was Friday and does not feel that he needs to go to the ER. Advised patient that Rosaria Ferries will be in touch to get this set up. Spoke to [place] and was advised that she is working on getting the CT scan schedule.

## 2014-08-19 NOTE — Telephone Encounter (Signed)
Noted, routed to PCP. Thanks.

## 2014-08-19 NOTE — Telephone Encounter (Signed)
Pt's spouse called to cancel appointment for 08/20/14.  States that pt feeling better and does not want appointment.  Best number to call is [phone] / lt

## 2014-08-20 ENCOUNTER — Other Ambulatory Visit: Payer: Self-pay | Admitting: Family Medicine

## 2014-08-20 ENCOUNTER — Ambulatory Visit: Payer: Medicare Other | Admitting: Family Medicine

## 2014-08-26 ENCOUNTER — Encounter: Payer: Self-pay | Admitting: Family Medicine

## 2014-08-26 ENCOUNTER — Ambulatory Visit (INDEPENDENT_AMBULATORY_CARE_PROVIDER_SITE_OTHER): Payer: Medicare Other | Admitting: Family Medicine

## 2014-08-26 VITALS — BP 122/60 | HR 62 | Temp 97.7°F | Wt 277.4 lb

## 2014-08-26 DIAGNOSIS — K219 Gastro-esophageal reflux disease without esophagitis: Secondary | ICD-10-CM | POA: Diagnosis not present

## 2014-08-26 DIAGNOSIS — K5792 Diverticulitis of intestine, part unspecified, without perforation or abscess without bleeding: Secondary | ICD-10-CM

## 2014-08-26 DIAGNOSIS — K3184 Gastroparesis: Secondary | ICD-10-CM

## 2014-08-26 DIAGNOSIS — E1143 Type 2 diabetes mellitus with diabetic autonomic (poly)neuropathy: Secondary | ICD-10-CM | POA: Diagnosis not present

## 2014-08-26 DIAGNOSIS — I10 Essential (primary) hypertension: Secondary | ICD-10-CM

## 2014-08-26 DIAGNOSIS — I2510 Atherosclerotic heart disease of native coronary artery without angina pectoris: Secondary | ICD-10-CM

## 2014-08-26 MED ORDER — TRAMADOL HCL 50 MG PO TABS: 50.0000 mg | ORAL_TABLET | Freq: Two times a day (BID) | ORAL | Status: DC | PRN

## 2014-08-26 NOTE — Assessment & Plan Note (Signed)
Continue nexium 2m BID and carafate prn as well as reglan.

## 2014-08-26 NOTE — Patient Instructions (Addendum)
Continue medicines as up to now. I've refilled tramadol. Return as needed or in 4 months for medicare wellness visit

## 2014-08-26 NOTE — Assessment & Plan Note (Signed)
Difficulty staying active after recent illness. Motivated to restart

## 2014-08-26 NOTE — Progress Notes (Signed)
BP 122/60 mmHg  Pulse 62  Temp(Src) 97.7 F (36.5 C) (Oral)  Wt 277 lb 6.4 oz (125.828 kg)  SpO2 97%   CC: 4 mo f/u visit  Subjective:    Patient ID: Chad [name], male    DOB: 1955/03/21, 59 y.o.   MRN: [id number]  HPI: Chad [name] is a 59 y.o. male presenting on 08/26/2014 for Follow-up   Recent diverticulitis treated with cipro/flagyl, slowed recovery but finally feeling better.   Tough period for healthy lifestyle/diet changes.   Takes reglan TID. Tolerating well.  Didn't take oxycodone - too strong.  Requests refill of hydrocodone. Increased use during diverticulitis. Almost out of tramadol and hydrocodone.   Relevant past medical, surgical, family and social history reviewed and updated as indicated. Interim medical history since our last visit reviewed. Allergies and medications reviewed and updated. Current Outpatient Prescriptions on File Prior to Visit  Medication Sig  . albuterol (PROVENTIL HFA;VENTOLIN HFA) 108 (90 BASE) MCG/ACT inhaler Inhale 2 puffs into the lungs every 6 (six) hours as needed for wheezing or shortness of breath.  Marland Kitchen aspirin EC 81 MG tablet Take 162 mg by mouth daily.    Marland Kitchen CARAFATE 1 GM/10ML suspension TAKE 10ML BY MOUTH 4 TIMES A DAY (Patient taking differently: TAKE 10ML BY MOUTH 4 TIMES DAILYU AS NEEDED FOR STOMACH INDIGESTION)  . docusate sodium 100 MG CAPS Take 100 mg by mouth daily as needed for mild constipation.  . Flaxseed, Linseed, (FLAX SEED OIL) 1000 MG CAPS Take 1 capsule by mouth 2 (two) times daily.    . furosemide (LASIX) 20 MG tablet Take 1 tablet (20 mg total) by mouth daily as needed for edema.  Marland Kitchen HYDROcodone-acetaminophen (NORCO) 10-325 MG per tablet Take 1 tablet by mouth 3 (three) times daily as needed for moderate pain.  Marland Kitchen levothyroxine (SYNTHROID, LEVOTHROID) 100 MCG tablet Take 1 tablet (100 mcg total) by mouth daily.  . meclizine (ANTIVERT) 25 MG tablet Take 1 tablet (25 mg total) by mouth 2 (two) times daily as needed  for dizziness or nausea.  . metoCLOPramide (REGLAN) 10 MG tablet Take 1 tablet (10 mg total) by mouth 3 (three) times daily before meals. NEEDS OFFICE VISIT FOR FURTHER REFILLS  . metoprolol tartrate (LOPRESSOR) 25 MG tablet TAKE 1 TABLET BY MOUTH TWICE A DAY  . Multiple Vitamin (MULITIVITAMIN WITH MINERALS) TABS Take 1 tablet by mouth daily.  Marland Kitchen NEXIUM 40 MG capsule TAKE 1 CAPSULE BY MOUTH TWICE DAILY  . nitroGLYCERIN (NITROSTAT) 0.4 MG SL tablet Place 1 tablet (0.4 mg total) under the tongue every 5 (five) minutes as needed for chest pain.  Marland Kitchen omega-3 acid ethyl esters (LOVAZA) 1 G capsule TAKE 2 CAPSULES BY MOUTH TWICE A DAY  . ondansetron (ZOFRAN) 4 MG tablet Take 1 tablet (4 mg total) by mouth every 6 (six) hours.  . polyethylene glycol powder (GLYCOLAX/MIRALAX) powder Take 17 g by mouth daily.  . potassium chloride (K-DUR) 10 MEQ tablet Take 10 mEq by mouth daily as needed (ONLY WHEN TAKING FUROSEMIDE).   . Red Yeast Rice 600 MG CAPS Take 1 capsule by mouth 2 (two) times daily.   Marland Kitchen triamcinolone cream (KENALOG) 0.1 % Apply 1 application topically 2 (two) times daily. Apply to AA.   No current facility-administered medications on file prior to visit.    Review of Systems Per HPI unless specifically indicated above     Objective:    BP 122/60 mmHg  Pulse 62  Temp(Src) 97.7 F (  36.5 C) (Oral)  Wt 277 lb 6.4 oz (125.828 kg)  SpO2 97%  Wt Readings from Last 3 Encounters:  08/26/14 277 lb 6.4 oz (125.828 kg)  08/15/14 275 lb 12 oz (125.079 kg)  08/10/14 279 lb 5 oz (126.695 kg)   Body mass index is 40.95 kg/(m^2).  Physical Exam  Constitutional: He appears well-developed and well-nourished. No distress.  HENT:  Mouth/Throat: Oropharynx is clear and moist. No oropharyngeal exudate.  Eyes: Conjunctivae and EOM are normal. Pupils are equal, round, and reactive to light. No scleral icterus.  Neck: Normal range of motion. Neck supple. No thyromegaly present.  Cardiovascular: Normal  rate, regular rhythm, normal heart sounds and intact distal pulses.   No murmur heard. Pulmonary/Chest: Effort normal and breath sounds normal. No respiratory distress. He has no wheezes. He has no rales.  Musculoskeletal: He exhibits no edema.  Lymphadenopathy:    He has no cervical adenopathy.  Psychiatric: He has a normal mood and affect.  Nursing note and vitals reviewed.      Assessment & Plan:   Problem List Items Addressed This Visit    Acute diverticulitis - Primary    Slowly resolving. Continue to monitor. Off abx.      Diabetic gastroparesis    Continues reglan 45m TID, tolerating well without pyramidal sxs.      GERD (gastroesophageal reflux disease)    Continue nexium 453mBID and carafate prn as well as reglan.      HTN (hypertension)    Chronic, stable. Continue current regimen.      Obesity, Class III, BMI 40-49.9 (morbid obesity)    Difficulty staying active after recent illness. Motivated to restart           Follow up plan: Return in about 4 months (around 12/27/2014), or as needed, for medicare wellness.

## 2014-08-26 NOTE — Assessment & Plan Note (Signed)
Continues reglan 56m TID, tolerating well without pyramidal sxs.

## 2014-08-26 NOTE — Assessment & Plan Note (Signed)
Chronic, stable. Continue current regimen. 

## 2014-08-26 NOTE — Assessment & Plan Note (Signed)
Slowly resolving. Continue to monitor. Off abx.

## 2014-08-26 NOTE — Progress Notes (Signed)
Pre visit review using our clinic review tool, if applicable. No additional management support is needed unless otherwise documented below in the visit note. 

## 2014-08-29 ENCOUNTER — Encounter: Payer: Self-pay | Admitting: Cardiovascular Disease

## 2014-08-29 ENCOUNTER — Ambulatory Visit (INDEPENDENT_AMBULATORY_CARE_PROVIDER_SITE_OTHER): Payer: Medicare Other | Admitting: Cardiovascular Disease

## 2014-08-29 VITALS — BP 120/60 | HR 61 | Ht 69.0 in | Wt 277.0 lb

## 2014-08-29 DIAGNOSIS — I10 Essential (primary) hypertension: Secondary | ICD-10-CM

## 2014-08-29 DIAGNOSIS — I5032 Chronic diastolic (congestive) heart failure: Secondary | ICD-10-CM | POA: Diagnosis not present

## 2014-08-29 DIAGNOSIS — I440 Atrioventricular block, first degree: Secondary | ICD-10-CM

## 2014-08-29 DIAGNOSIS — I2510 Atherosclerotic heart disease of native coronary artery without angina pectoris: Secondary | ICD-10-CM | POA: Diagnosis not present

## 2014-08-29 DIAGNOSIS — E785 Hyperlipidemia, unspecified: Secondary | ICD-10-CM

## 2014-08-29 NOTE — Assessment & Plan Note (Signed)
Dramatic weight loss. Encouraged him to continue slow steady weight reduction

## 2014-08-29 NOTE — Assessment & Plan Note (Signed)
He appears relatively euvolemic on today's visit. He is taking Lasix as needed Weight dramatically improved with dietary changes
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
## 2014-08-29 NOTE — Assessment & Plan Note (Signed)
Currently with no symptoms of angina. No further workup at this time. Continue current medication regimen. 

## 2014-08-29 NOTE — Patient Instructions (Signed)
You are doing well. No medication changes were made.  Please call us if you have new issues that need to be addressed before your next appt.  Your physician wants you to follow-up in: 12 months.  You will receive a reminder letter in the mail two months in advance. If you don't receive a letter, please call our office to schedule the follow-up appointment. 

## 2014-08-29 NOTE — Assessment & Plan Note (Signed)
Blood pressure is well controlled on today's visit. No changes made to the medications. 

## 2014-08-29 NOTE — Assessment & Plan Note (Signed)
Unable to tolerate statins. Recommended  continuedweight loss.  Taking red yeast rice

## 2014-09-01 ENCOUNTER — Other Ambulatory Visit: Payer: Self-pay

## 2014-09-01 NOTE — Telephone Encounter (Signed)
Pt left v/m requesting rx hydrocodone apap. Call when ready for pick up. rx last printed # 60 on 08/01/14. Pt last seen 08/26/2014.

## 2014-09-02 MED ORDER — HYDROCODONE-ACETAMINOPHEN 10-325 MG PO TABS: 1.0000 | ORAL_TABLET | Freq: Three times a day (TID) | ORAL | Status: DC | PRN

## 2014-09-02 NOTE — Telephone Encounter (Signed)
printed and in Kim's box.

## 2014-09-02 NOTE — Telephone Encounter (Signed)
Message left notifying patient and Rx placed up front for pick up.

## 2014-09-24 ENCOUNTER — Encounter: Payer: Self-pay | Admitting: *Deleted

## 2014-10-02 ENCOUNTER — Other Ambulatory Visit: Payer: Self-pay

## 2014-10-02 MED ORDER — HYDROCODONE-ACETAMINOPHEN 10-325 MG PO TABS: 1.0000 | ORAL_TABLET | Freq: Three times a day (TID) | ORAL | Status: DC | PRN

## 2014-10-02 NOTE — Telephone Encounter (Signed)
Patient notified and Rx placed up front for pick up.

## 2014-10-02 NOTE — Telephone Encounter (Signed)
Printed and in Kim's box.

## 2014-10-02 NOTE — Telephone Encounter (Signed)
Pt's wife left v/m requesting rx hydrocodone apap. Call when ready for pick up.rx last printed # 60 on 09/02/14 and pt last seen 08/26/14.

## 2014-10-06 ENCOUNTER — Ambulatory Visit (INDEPENDENT_AMBULATORY_CARE_PROVIDER_SITE_OTHER): Payer: Medicare Other | Admitting: Internal Medicine

## 2014-10-06 ENCOUNTER — Other Ambulatory Visit: Payer: Self-pay | Admitting: Family Medicine

## 2014-10-06 ENCOUNTER — Encounter: Payer: Self-pay | Admitting: Internal Medicine

## 2014-10-06 VITALS — BP 142/72 | HR 65 | Ht 69.0 in | Wt 282.6 lb

## 2014-10-06 DIAGNOSIS — K3184 Gastroparesis: Secondary | ICD-10-CM

## 2014-10-06 DIAGNOSIS — K5732 Diverticulitis of large intestine without perforation or abscess without bleeding: Secondary | ICD-10-CM

## 2014-10-06 DIAGNOSIS — I2510 Atherosclerotic heart disease of native coronary artery without angina pectoris: Secondary | ICD-10-CM

## 2014-10-06 DIAGNOSIS — Z1211 Encounter for screening for malignant neoplasm of colon: Secondary | ICD-10-CM | POA: Diagnosis not present

## 2014-10-06 DIAGNOSIS — K219 Gastro-esophageal reflux disease without esophagitis: Secondary | ICD-10-CM | POA: Diagnosis not present

## 2014-10-06 MED ORDER — NA SULFATE-K SULFATE-MG SULF 17.5-3.13-1.6 GM/177ML PO SOLN: ORAL | Status: DC

## 2014-10-06 NOTE — Patient Instructions (Signed)

## 2014-10-06 NOTE — Progress Notes (Signed)
Subjective:    Patient ID: Chad [name], male    DOB: [birth year], 59 y.o.   MRN: [id number]  HPI Chad [name] is a 59 year old male with past medical history of GERD, chronic gastroparesis, history of diabetes, morbid obesity status post what is felt to be sleeve gastrectomy, history of cholecystectomy who seen for follow-up. He was diagnosed and treated for diverticulitis in June 2016. He reports developing lower and mid abdominal pain in mid June. This started slowly but then became severe. CT scan initially showed sigmoid diverticulitis without complication. Substance CT scan approximately 10 days later showed improving diverticulitis on antibiotics. He states it took several weeks for symptoms to improve but they have now finally improved completely. He is eating well without pain. He has lost 110 pounds intentionally over the last 1-2 years. He is been losing weight with his wife. Appetite has been good. He has had constipation worsened since taking antibiotics for diverticulitis. He's not been using laxative. He's having a bowel movement every 3-4 days but reports they are hard to pass. No bleeding or melena. He is taking metoclopramide 10 mg before meals prescribed by Dr. Danise Mina. He reports this was initially started at [facility] when he was initially diagnosed with gastroparesis. He has found it to be incredibly helpful. He doesn't continue to occasionally use Carafate. He is taking Nexium 40 mg twice daily for acid reflux control.  Review of Systems As per history of present illness, otherwise negative  Current Medications, Allergies, Past Medical History, Past Surgical History, Family History and Social History were reviewed in [employer] record.     Objective:   Physical Exam BP 142/72 mmHg  Pulse 65  Ht 5' 9"  (1.753 m)  Wt 282 lb 9.6 oz (128.187 kg)  BMI 41.71 kg/m2 Constitutional: Well-developed and well-nourished. No distress. HEENT: Normocephalic and  atraumatic. Oropharynx is clear and moist. No oropharyngeal exudate. Conjunctivae are normal.  No scleral icterus. Neck: Neck supple. Trachea midline. Cardiovascular: Normal rate, regular rhythm and intact distal pulses. No M/R/G Pulmonary/chest: Effort normal and breath sounds normal. No wheezing, rales or rhonchi. Abdominal: Soft, diffuse tenderness without rebound or guarding, obese, nondistended. Bowel sounds active throughout. Extremities: no clubbing, cyanosis, with trace pretibial edema Lymphadenopathy: No cervical adenopathy noted. Neurological: Alert and oriented to person place and time. Skin: Skin is warm and dry. No rashes noted. Psychiatric: Normal mood and affect. Behavior is normal.  CBC    Component Value Date/Time   WBC 8.6 08/10/2014 1551   RBC 5.00 08/10/2014 1551   HGB 14.0 08/10/2014 1551   HCT 41.3 08/10/2014 1551   PLT 123* 08/10/2014 1551   MCV 82.6 08/10/2014 1551   MCV 88.0 08/04/2010   MCH 28.0 08/10/2014 1551   MCHC 33.9 08/10/2014 1551   RDW 14.2 08/10/2014 1551   LYMPHSABS 0.6* 08/10/2014 1551   MONOABS 0.9 08/10/2014 1551   EOSABS 0.1 08/10/2014 1551   BASOSABS 0.0 08/10/2014 1551    CMP     Component Value Date/Time   NA 137 08/10/2014 1551   NA 139 08/04/2010   K 3.7 08/10/2014 1551   K 4.4 08/04/2010   CL 102 08/10/2014 1551   CO2 26 08/10/2014 1551   GLUCOSE 152* 08/10/2014 1551   BUN 12 08/10/2014 1551   CREATININE 1.14 08/10/2014 1551   CREATININE 1.08 08/04/2010   CALCIUM 8.7* 08/10/2014 1551   PROT 6.7 08/10/2014 1551   ALBUMIN 3.7 08/10/2014 1551   AST 57* 08/10/2014 1551  AST 47 08/04/2010   ALT 43 08/10/2014 1551   ALKPHOS 201* 08/10/2014 1551   BILITOT 1.0 08/10/2014 1551   GFRNONAA >60 08/10/2014 1551   GFRAA >60 08/10/2014 1551    CT ABDOMEN AND PELVIS WITH CONTRAST   TECHNIQUE: Multidetector CT imaging of the abdomen and pelvis was performed using the standard protocol following bolus administration  of intravenous contrast.   CONTRAST:  1 OMNIPAQUE IOHEXOL 240 MG/ML SOLN, 151m OMNIPAQUE IOHEXOL 300 MG/ML SOLN   COMPARISON:  08/10/2014   FINDINGS: Lung bases are clear. No pleural or pericardial fluid. The liver is normal. Previous cholecystectomy. Splenomegaly persists with a small amount lateral capsular thickening unchanged since the previous studies. The pancreas is normal. No worrisome adrenal finding. Fatty mass of the right adrenal gland measuring 23 mm is unchanged. The kidneys are normal. The aorta and IVC are normal.   Inflammatory changes related to sigmoid diverticulitis have improved. There is less regional edema. No obstruction. No evidence of developing abscess.   Bladder, prostate gland and seminal vesicles are unremarkable. No acute bone finding.   IMPRESSION: Improving inflammatory change in the region of sigmoid diverticulitis. No worsening. No abscess development.   No change otherwise with chronic splenomegaly and right adrenal myelo lipoma.   These results will be called to the ordering clinician or representative by the Radiologist Assistant, and communication documented in the PACS or zVision Dashboard.     Electronically Signed   By: MNelson ChimesM.D.   On: 08/18/2014 14:22 ___________________________________________________________________________________  CT ABDOMEN AND PELVIS WITH CONTRAST   TECHNIQUE: Multidetector CT imaging of the abdomen and pelvis was performed using the standard protocol following bolus administration of intravenous contrast.   CONTRAST:  1027mOMNIPAQUE IOHEXOL 300 MG/ML  SOLN   COMPARISON:  08/22/2013, 03/11/2007   FINDINGS: Lower chest:   Unremarkable appearance of the soft tissues of the chest wall.   Heart size within normal limits.  No pericardial fluid/thickening.   No lower mediastinal adenopathy.   Unremarkable appearance of the distal esophagus.   No evidence of hilar hernia.   Calcified  granuloma of the left lung. No confluent airspace disease.   Abdomen/pelvis:   Unremarkable liver parenchyma. Calcifications of the spleen, compatible with prior granulomatous disease. Cranial caudal span of the spleen measures 18 cm, similar to prior.   Unremarkable appearance of bilateral adrenal glands.   Unremarkable pancreas.   Surgical changes of cholecystectomy.   No intrahepatic or extrahepatic biliary ductal dilatation.   No intra-peritoneal free air or significant free-fluid.   No abnormally distended small bowel or colon.   Colonic diverticula. Inflammatory changes adjacent to the sigmoid colon diverticula, compatible diverticulitis. No focal fluid collection or evidence of perforation.   Surgical changes of vagotomy of the stomach.   Normal appendix identified.   Right Kidney/Ureter:   No hydronephrosis. No nephrolithiasis. No perinephric stranding. Unremarkable course of the right ureter.   Left Kidney/Ureter:   No hydronephrosis. No nephrolithiasis. No perinephric stranding.   Unremarkable course of the left ureter.   Unremarkable appearance of the urinary bladder.   Transverse diameter of the prostate measures 4.3 cm.   Scattered vascular calcifications. No abdominal aortic aneurysm. No periaortic fluid.   Musculoskeletal:  No displaced fracture. Multilevel degenerative changes of the spine.   IMPRESSION: Sigmoid diverticulitis, without evidence of abscess or perforation.   Similar appearance of splenomegaly, unchanged from the comparison.   Additional incidental findings as above.   Electronically Signed   By: JaCorrie Mckusick  D.O.   On: 08/10/2014 18:26   Colonoscopy Sept 2008 (Dr. Myrene Buddy, [facility]) -- left-sided diverticulosis, 1 mm hyperplastic descending colon polyp     Assessment & Plan:   59 year old male with past medical history of GERD, chronic gastroparesis, history of diabetes, morbid obesity status post what is felt to be sleeve  gastrectomy, history of cholecystectomy who seen for follow-up. He was diagnosed and treated for diverticulitis in June 2016.  1. Now resolved diverticulitis/constipation -- uncomplicated sigmoid diverticulitis treated with antibiotics. He's having some resultant constipation after antibiotics treatment. I recommended colonoscopy within several months to evaluate the: Rule out IBD and other pathology. Last colonoscopy 8 years ago. This test will be performed for screening. Begin MiraLAX 17 g daily. Call if no improvement in constipation. Call immediately with any recurrent abdominal pain mimicking prior diverticulitis pain  2. History of GERD and gastroparesis -- heartburn is controlled with Nexium. Continue 40 mg once daily, twice daily if absolute necessary. He is taking metoclopramide 10 mg before meals. We reviewed the long-term risk though rare, for neurologic complications such as tardive dyskinesia.  Further recommendations after colonoscopy

## 2014-10-07 ENCOUNTER — Other Ambulatory Visit: Payer: Self-pay | Admitting: Family Medicine

## 2014-10-07 ENCOUNTER — Ambulatory Visit (INDEPENDENT_AMBULATORY_CARE_PROVIDER_SITE_OTHER): Payer: Medicare Other | Admitting: Family Medicine

## 2014-10-07 ENCOUNTER — Encounter: Payer: Self-pay | Admitting: Family Medicine

## 2014-10-07 VITALS — BP 128/70 | HR 60 | Temp 98.1°F | Wt 282.0 lb

## 2014-10-07 DIAGNOSIS — I2510 Atherosclerotic heart disease of native coronary artery without angina pectoris: Secondary | ICD-10-CM

## 2014-10-07 DIAGNOSIS — M25512 Pain in left shoulder: Secondary | ICD-10-CM | POA: Diagnosis not present

## 2014-10-07 NOTE — Assessment & Plan Note (Addendum)
Anticipate bursitis as well as tendonitis and developing adhesive capsulitis from disuse.  Discussed options of steroid shot vs oral steroid course, pt would like steroid injection.  Done today, pt tolerated well. Discussed red flags to seek care. Update if persistent discomfort for referral to ortho.

## 2014-10-07 NOTE — Progress Notes (Signed)
BP 128/70 mmHg  Pulse 60  Temp(Src) 98.1 F (36.7 C) (Tympanic)  Wt 282 lb (127.914 kg)  SpO2 98%   CC: arm pain  Subjective:    Patient ID: Chad [name], male    DOB: 24-Aug-1955, 59 y.o.   MRN: [id number]  HPI: Chad [name] is a 59 y.o. male presenting on 10/07/2014 for arm pain   L shoulder pain worsening over last 2 months. Denies inciting falls or injury. Worse pain when trying to get comfortable in bed. Describes intermittent sharp stabbing pain. Denies significant neck pain. Some shooting pain down shoulder. No redness, swelling or warmth of shoulder. Keeping shoulder still improves pain.   No h/o shoulder pain or trauma in the past.   Has tried icy hot, asper cream without improvement. Also takes hydrocodone and tramadol for pain. Avoids NSAIDS 2/2 GI issues for which he takes nexium 5m bid and reglan.   Saw Dr PHilarie Fredricksonyesterday - pending colonoscopy. Advised to increase miralax.  Relevant past medical, surgical, family and social history reviewed and updated as indicated. Interim medical history since our last visit reviewed. Allergies and medications reviewed and updated. Current Outpatient Prescriptions on File Prior to Visit  Medication Sig  . albuterol (PROVENTIL HFA;VENTOLIN HFA) 108 (90 BASE) MCG/ACT inhaler Inhale 2 puffs into the lungs every 6 (six) hours as needed for wheezing or shortness of breath.  .Marland Kitchenaspirin EC 81 MG tablet Take 162 mg by mouth daily.    .Marland KitchenCARAFATE 1 GM/10ML suspension TAKE 10ML BY MOUTH 4 TIMES A DAY (Patient taking differently: TAKE 10ML BY MOUTH 4 TIMES DAILYU AS NEEDED FOR STOMACH INDIGESTION)  . docusate sodium 100 MG CAPS Take 100 mg by mouth daily as needed for mild constipation.  . Flaxseed, Linseed, (FLAX SEED OIL) 1000 MG CAPS Take 1 capsule by mouth 2 (two) times daily.    . furosemide (LASIX) 20 MG tablet Take 1 tablet (20 mg total) by mouth daily as needed for edema.  .Marland KitchenHYDROcodone-acetaminophen (NORCO) 10-325 MG per tablet Take  1 tablet by mouth 3 (three) times daily as needed for moderate pain.  .Marland Kitchenlevothyroxine (SYNTHROID, LEVOTHROID) 100 MCG tablet Take 1 tablet (100 mcg total) by mouth daily.  . meclizine (ANTIVERT) 25 MG tablet Take 1 tablet (25 mg total) by mouth 2 (two) times daily as needed for dizziness or nausea.  . metoCLOPramide (REGLAN) 10 MG tablet Take 1 tablet (10 mg total) by mouth 3 (three) times daily before meals. NEEDS OFFICE VISIT FOR FURTHER REFILLS  . metoprolol tartrate (LOPRESSOR) 25 MG tablet TAKE 1 TABLET BY MOUTH TWICE A DAY  . Multiple Vitamin (MULITIVITAMIN WITH MINERALS) TABS Take 1 tablet by mouth daily.  . Na Sulfate-K Sulfate-Mg Sulf SOLN Suprep-use as directed  . NEXIUM 40 MG capsule TAKE 1 CAPSULE BY MOUTH TWICE DAILY  . nitroGLYCERIN (NITROSTAT) 0.4 MG SL tablet Place 1 tablet (0.4 mg total) under the tongue every 5 (five) minutes as needed for chest pain.  .Marland Kitchenomega-3 acid ethyl esters (LOVAZA) 1 G capsule TAKE 2 CAPSULES BY MOUTH TWICE A DAY  . ondansetron (ZOFRAN) 4 MG tablet Take 1 tablet (4 mg total) by mouth every 6 (six) hours.  . polyethylene glycol powder (GLYCOLAX/MIRALAX) powder Take 17 g by mouth daily.  . potassium chloride (K-DUR) 10 MEQ tablet Take 10 mEq by mouth daily as needed (ONLY WHEN TAKING FUROSEMIDE).   . Red Yeast Rice 600 MG CAPS Take 1 capsule by mouth 2 (two) times  daily.   . traMADol (ULTRAM) 50 MG tablet Take 1 tablet (50 mg total) by mouth 2 (two) times daily as needed.   No current facility-administered medications on file prior to visit.    Review of Systems Per HPI unless specifically indicated above     Objective:    BP 128/70 mmHg  Pulse 60  Temp(Src) 98.1 F (36.7 C) (Tympanic)  Wt 282 lb (127.914 kg)  SpO2 98%  Wt Readings from Last 3 Encounters:  10/07/14 282 lb (127.914 kg)  10/06/14 282 lb 9.6 oz (128.187 kg)  08/29/14 277 lb (125.646 kg)    Physical Exam  Constitutional: He appears well-developed and well-nourished. No  distress.  Musculoskeletal: He exhibits no edema.  Holds L arm elevated, internally rotated in pain R shoulder WNL L Shoulder exam: No deformity of shoulders on inspection. Tender to palpation throughout entire L shoulder  Diminished ROM in abduction and forward flexion 2/2 pain. No pain or weakness with testing SITS in ext/int rotation. + pain with empty can sign. + impingement. No significant pain with rotation of humeral head in [place] joint.   Nursing note and vitals reviewed.  L shoulder steroid injection A steroid injection was performed at L shoulder using posterior approach with ethylene chloride spray numbing and 1% Lidocaine and 1 mg of depo-medrol (methylprednisolone) 47m. This was well tolerated. After care instructions discussed    Assessment & Plan:   Problem List Items Addressed This Visit    Left shoulder pain - Primary    Anticipate bursitis as well as tendonitis and developing adhesive capsulitis from disuse.  Discussed options of steroid shot vs oral steroid course, pt would like steroid injection.  Done today, pt tolerated well. Discussed red flags to seek care. Update if persistent discomfort for referral to ortho.          Follow up plan: Return if symptoms worsen or fail to improve.

## 2014-10-07 NOTE — Patient Instructions (Signed)
I think you have bursitis and tendonitis of shoulder. Steroid shot today. Watch for redness or swelling or warmth and return immediately if that happens. If no better, let us know for referral to orthopedist. Once feeling better, start stretching exercises provided today.

## 2014-10-07 NOTE — Progress Notes (Signed)
Pre visit review using our clinic review tool, if applicable. No additional management support is needed unless otherwise documented below in the visit note. 

## 2014-10-13 MED ORDER — METHYLPREDNISOLONE ACETATE 40 MG/ML IJ SUSP
40.0000 mg | Freq: Once | INTRAMUSCULAR | Status: AC
  Administered 2014-10-07: 40 mg via INTRA_ARTICULAR

## 2014-10-13 NOTE — Addendum Note (Signed)
Addended by: Carter Kitten on: 10/13/2014 08:51 AM   Modules accepted: Orders

## 2014-10-15 ENCOUNTER — Other Ambulatory Visit: Payer: Self-pay | Admitting: Family Medicine

## 2014-10-15 ENCOUNTER — Telehealth: Payer: Self-pay

## 2014-10-15 DIAGNOSIS — M25512 Pain in left shoulder: Secondary | ICD-10-CM

## 2014-10-15 NOTE — Telephone Encounter (Signed)
Mrs Sitter left v/m(DPR signed); pt was seen on 10/07/14 and pt's arm and shoulder is no better and request ortho referral. Mrs Beevers request cb.

## 2014-10-15 NOTE — Telephone Encounter (Signed)
Ok to refill 

## 2014-10-15 NOTE — Telephone Encounter (Signed)
Referral placed.

## 2014-10-15 NOTE — Telephone Encounter (Signed)
Rx called in as directed.   

## 2014-10-15 NOTE — Telephone Encounter (Signed)
plz phone in. 

## 2014-10-16 NOTE — Telephone Encounter (Signed)
[facility] Dr. Noemi Chapel 10/21/14 @ 945am  pt aware of appt day time and location -arc

## 2014-10-21 ENCOUNTER — Other Ambulatory Visit: Payer: Self-pay | Admitting: Orthopedic Surgery

## 2014-10-21 DIAGNOSIS — M75112 Incomplete rotator cuff tear or rupture of left shoulder, not specified as traumatic: Secondary | ICD-10-CM | POA: Diagnosis not present

## 2014-10-21 DIAGNOSIS — M25512 Pain in left shoulder: Secondary | ICD-10-CM

## 2014-10-23 ENCOUNTER — Ambulatory Visit (INDEPENDENT_AMBULATORY_CARE_PROVIDER_SITE_OTHER): Payer: Medicare Other

## 2014-10-23 DIAGNOSIS — Z23 Encounter for immunization: Secondary | ICD-10-CM | POA: Diagnosis not present

## 2014-10-23 HISTORY — PX: SHOULDER SURGERY: SHX246

## 2014-10-26 ENCOUNTER — Ambulatory Visit
Admission: RE | Admit: 2014-10-26 | Discharge: 2014-10-26 | Disposition: A | Discharge status: Home / Self Care | Payer: Medicare Other | Source: Ambulatory Visit | Attending: Orthopedic Surgery | Admitting: Orthopedic Surgery

## 2014-10-26 DIAGNOSIS — M25512 Pain in left shoulder: Secondary | ICD-10-CM

## 2014-10-30 DIAGNOSIS — M76822 Posterior tibial tendinitis, left leg: Secondary | ICD-10-CM | POA: Diagnosis not present

## 2014-10-30 DIAGNOSIS — M75112 Incomplete rotator cuff tear or rupture of left shoulder, not specified as traumatic: Secondary | ICD-10-CM | POA: Diagnosis not present

## 2014-10-31 ENCOUNTER — Other Ambulatory Visit: Payer: Self-pay | Admitting: Family Medicine

## 2014-10-31 ENCOUNTER — Other Ambulatory Visit: Payer: Self-pay

## 2014-10-31 MED ORDER — HYDROCODONE-ACETAMINOPHEN 10-325 MG PO TABS: 1.0000 | ORAL_TABLET | Freq: Three times a day (TID) | ORAL | Status: DC | PRN

## 2014-10-31 NOTE — Telephone Encounter (Signed)
Pt left v/m requesting rx hydrocodone apap. Call when ready for pick up. Dr Noemi Chapel scheduling pt for shoulder surgery. rx last printed # 60 on 10/02/14. Last seen 10/07/14.

## 2014-10-31 NOTE — Telephone Encounter (Signed)
Printed and in Kim's box.

## 2014-10-31 NOTE — Telephone Encounter (Signed)
Patient notified and Rx placed up front for pick up.

## 2014-10-31 NOTE — Telephone Encounter (Signed)
Ok to refill 

## 2014-11-03 ENCOUNTER — Ambulatory Visit (INDEPENDENT_AMBULATORY_CARE_PROVIDER_SITE_OTHER): Payer: Medicare Other | Admitting: Family Medicine

## 2014-11-03 ENCOUNTER — Encounter: Payer: Self-pay | Admitting: Family Medicine

## 2014-11-03 VITALS — BP 124/68 | HR 72 | Temp 97.9°F | Wt 280.5 lb

## 2014-11-03 DIAGNOSIS — Z01818 Encounter for other preprocedural examination: Secondary | ICD-10-CM | POA: Diagnosis not present

## 2014-11-03 DIAGNOSIS — Z8639 Personal history of other endocrine, nutritional and metabolic disease: Secondary | ICD-10-CM

## 2014-11-03 DIAGNOSIS — L03116 Cellulitis of left lower limb: Secondary | ICD-10-CM | POA: Diagnosis not present

## 2014-11-03 DIAGNOSIS — I2510 Atherosclerotic heart disease of native coronary artery without angina pectoris: Secondary | ICD-10-CM

## 2014-11-03 LAB — CBC WITH DIFFERENTIAL/PLATELET
BASOS ABS: 0 10*3/uL (ref 0.0–0.1)
Basophils Relative: 0.4 % (ref 0.0–3.0)
Eosinophils Absolute: 0.1 10*3/uL (ref 0.0–0.7)
Eosinophils Relative: 1.5 % (ref 0.0–5.0)
HEMATOCRIT: 43 % (ref 39.0–52.0)
HEMOGLOBIN: 14.6 g/dL (ref 13.0–17.0)
LYMPHS PCT: 17 % (ref 12.0–46.0)
Lymphs Abs: 0.9 10*3/uL (ref 0.7–4.0)
MCHC: 33.9 g/dL (ref 30.0–36.0)
MCV: 82.2 fl (ref 78.0–100.0)
MONOS PCT: 13.5 % — AB (ref 3.0–12.0)
Monocytes Absolute: 0.7 10*3/uL (ref 0.1–1.0)
NEUTROS PCT: 67.6 % (ref 43.0–77.0)
Neutro Abs: 3.4 10*3/uL (ref 1.4–7.7)
Platelets: 152 10*3/uL (ref 150.0–400.0)
RBC: 5.22 Mil/uL (ref 4.22–5.81)
RDW: 14.6 % (ref 11.5–15.5)
WBC: 5 10*3/uL (ref 4.0–10.5)

## 2014-11-03 LAB — COMPREHENSIVE METABOLIC PANEL
ALK PHOS: 221 U/L — AB (ref 39–117)
ALT: 56 U/L — ABNORMAL HIGH (ref 0–53)
AST: 49 U/L — ABNORMAL HIGH (ref 0–37)
Albumin: 4 g/dL (ref 3.5–5.2)
BUN: 23 mg/dL (ref 6–23)
CALCIUM: 9.4 mg/dL (ref 8.4–10.5)
CO2: 28 mEq/L (ref 19–32)
Chloride: 103 mEq/L (ref 96–112)
Creatinine, Ser: 0.87 mg/dL (ref 0.40–1.50)
GFR: 95.23 mL/min (ref >60.00)
Glucose, Bld: 109 mg/dL — ABNORMAL HIGH (ref 70–99)
POTASSIUM: 3.6 meq/L (ref 3.5–5.1)
Sodium: 138 mEq/L (ref 135–145)
TOTAL PROTEIN: 6.7 g/dL (ref 6.0–8.3)
Total Bilirubin: 1 mg/dL (ref 0.2–1.2)

## 2014-11-03 LAB — HEMOGLOBIN A1C: Hgb A1c MFr Bld: 5.5 % (ref 4.6–6.5)

## 2014-11-03 MED ORDER — CEPHALEXIN 500 MG PO CAPS: 500.0000 mg | ORAL_CAPSULE | Freq: Three times a day (TID) | ORAL | Status: DC

## 2014-11-03 NOTE — Progress Notes (Signed)
BP 124/68 mmHg  Pulse 72  Temp(Src) 97.9 F (36.6 C) (Oral)  Wt 280 lb 8 oz (127.234 kg)   CC: L foot swelling/rash  Subjective:    Patient ID: Chad [name], male    DOB: 08-Nov-1955, 59 y.o.   MRN: [id number]  HPI: Chad [name] is a 59 y.o. male presenting on 11/03/2014 for Foot Swelling   2d h/o L foot swelling and rash without itching or pain - just swelling more noticeable when he has shoe on. Rash seems to be spreading. 1 wk ago he did scrape dorsal foot against rocking chair.   Self treated with benadryl without improvement.   No fevers/chills.   Started after yard [employer] Saturday morning.   Also requests preop eval for upcoming L shoulder arthroscopy for RTC tear. Last surgery 06/2011 knee arthroscopy, has tolerated GETA before. No trouble falling asleep or waking up, no nausea with anesthesia. Denies current chest pain, tightness, dyspnea, palpitations, headaches or dizziness.   Relevant past medical, surgical, family and social history reviewed and updated as indicated. Interim medical history since our last visit reviewed. Allergies and medications reviewed and updated. Current Outpatient Prescriptions on File Prior to Visit  Medication Sig  . albuterol (PROVENTIL HFA;VENTOLIN HFA) 108 (90 BASE) MCG/ACT inhaler Inhale 2 puffs into the lungs every 6 (six) hours as needed for wheezing or shortness of breath.  Marland Kitchen aspirin EC 81 MG tablet Take 162 mg by mouth daily.    Marland Kitchen CARAFATE 1 GM/10ML suspension TAKE 10ML BY MOUTH 4 TIMES A DAY (Patient taking differently: TAKE 10ML BY MOUTH 4 TIMES DAILYU AS NEEDED FOR STOMACH INDIGESTION)  . docusate sodium 100 MG CAPS Take 100 mg by mouth daily as needed for mild constipation.  . Flaxseed, Linseed, (FLAX SEED OIL) 1000 MG CAPS Take 1 capsule by mouth 2 (two) times daily.    . furosemide (LASIX) 20 MG tablet Take 1 tablet (20 mg total) by mouth daily as needed for edema.  Marland Kitchen HYDROcodone-acetaminophen (NORCO) 10-325 MG per tablet  Take 1 tablet by mouth 3 (three) times daily as needed for moderate pain.  Marland Kitchen levothyroxine (SYNTHROID, LEVOTHROID) 100 MCG tablet Take 1 tablet (100 mcg total) by mouth daily.  . meclizine (ANTIVERT) 25 MG tablet Take 1 tablet (25 mg total) by mouth 2 (two) times daily as needed for dizziness or nausea.  . metoCLOPramide (REGLAN) 10 MG tablet TAKE 1 TABLET BY MOUTH 3 TIMES DAILY BEFORE MEALS  . metoprolol tartrate (LOPRESSOR) 25 MG tablet TAKE 1 TABLET BY MOUTH TWICE A DAY  . Multiple Vitamin (MULITIVITAMIN WITH MINERALS) TABS Take 1 tablet by mouth daily.  . Na Sulfate-K Sulfate-Mg Sulf SOLN Suprep-use as directed  . NEXIUM 40 MG capsule TAKE 1 CAPSULE BY MOUTH TWICE DAILY  . nitroGLYCERIN (NITROSTAT) 0.4 MG SL tablet Place 1 tablet (0.4 mg total) under the tongue every 5 (five) minutes as needed for chest pain.  Marland Kitchen omega-3 acid ethyl esters (LOVAZA) 1 G capsule TAKE 2 CAPSULES BY MOUTH TWICE A DAY  . ondansetron (ZOFRAN) 4 MG tablet Take 1 tablet (4 mg total) by mouth every 6 (six) hours.  . polyethylene glycol powder (GLYCOLAX/MIRALAX) powder Take 17 g by mouth daily.  . potassium chloride (K-DUR) 10 MEQ tablet Take 10 mEq by mouth daily as needed (ONLY WHEN TAKING FUROSEMIDE).   . Red Yeast Rice 600 MG CAPS Take 1 capsule by mouth 2 (two) times daily.   . traMADol (ULTRAM) 50 MG tablet TAKE  1 TABLET BY MOUTH TWICE A DAY AS NEEDED   No current facility-administered medications on file prior to visit.    Review of Systems Per HPI unless specifically indicated above     Objective:    BP 124/68 mmHg  Pulse 72  Temp(Src) 97.9 F (36.6 C) (Oral)  Wt 280 lb 8 oz (127.234 kg)  Wt Readings from Last 3 Encounters:  11/03/14 280 lb 8 oz (127.234 kg)  10/07/14 282 lb (127.914 kg)  10/06/14 282 lb 9.6 oz (128.187 kg)    Physical Exam  Constitutional: He appears well-developed and well-nourished. No distress.  Musculoskeletal: He exhibits edema.  2+ DP bilaterally Mild dorsal L foot  edema Multiple abrasions throughout bilateral lower legs  Skin: Skin is warm and dry. There is erythema.  Erythematous rash L dorsal foot Healing abrasion L dorsal foot Tan lines from sandal use  Nursing note and vitals reviewed.  Results for orders placed or performed during the hospital encounter of 08/10/14  CBC with Differential  Result Value Ref Range   WBC 8.6 4.0 - 10.5 K/uL   RBC 5.00 4.22 - 5.81 MIL/uL   Hemoglobin 14.0 13.0 - 17.0 g/dL   HCT 41.3 39.0 - 52.0 %   MCV 82.6 78.0 - 100.0 fL   MCH 28.0 26.0 - 34.0 pg   MCHC 33.9 30.0 - 36.0 g/dL   RDW 14.2 11.5 - 15.5 %   Platelets 123 (L) 150 - 400 K/uL   Neutrophils Relative % 81 (H) 43 - 77 %   Neutro Abs 7.0 1.7 - 7.7 K/uL   Lymphocytes Relative 8 (L) 12 - 46 %   Lymphs Abs 0.6 (L) 0.7 - 4.0 K/uL   Monocytes Relative 10 3 - 12 %   Monocytes Absolute 0.9 0.1 - 1.0 K/uL   Eosinophils Relative 1 0 - 5 %   Eosinophils Absolute 0.1 0.0 - 0.7 K/uL   Basophils Relative 0 0 - 1 %   Basophils Absolute 0.0 0.0 - 0.1 K/uL  Comprehensive metabolic panel  Result Value Ref Range   Sodium 137 135 - 145 mmol/L   Potassium 3.7 3.5 - 5.1 mmol/L   Chloride 102 101 - 111 mmol/L   CO2 26 22 - 32 mmol/L   Glucose, Bld 152 (H) 65 - 99 mg/dL   BUN 12 6 - 20 mg/dL   Creatinine, Ser 1.14 0.61 - 1.24 mg/dL   Calcium 8.7 (L) 8.9 - 10.3 mg/dL   Total Protein 6.7 6.5 - 8.1 g/dL   Albumin 3.7 3.5 - 5.0 g/dL   AST 57 (H) 15 - 41 U/L   ALT 43 17 - 63 U/L   Alkaline Phosphatase 201 (H) 38 - 126 U/L   Total Bilirubin 1.0 0.3 - 1.2 mg/dL   GFR calc non Af Amer >60 >60 mL/min   GFR calc Af Amer >60 >60 mL/min   Anion gap 9 5 - 15  Lipase, blood  Result Value Ref Range   Lipase 30 22 - 51 U/L  Urinalysis, Routine w reflex microscopic (not at [facility])  Result Value Ref Range   Color, Urine YELLOW YELLOW   APPearance CLEAR CLEAR   Specific Gravity, Urine 1.022 1.005 - 1.030   pH 5.5 5.0 - 8.0   Glucose, UA NEGATIVE NEGATIVE mg/dL   Hgb urine  dipstick NEGATIVE NEGATIVE   Bilirubin Urine NEGATIVE NEGATIVE   Ketones, ur NEGATIVE NEGATIVE mg/dL   Protein, ur NEGATIVE NEGATIVE mg/dL   Urobilinogen, UA 0.2  0.0 - 1.0 mg/dL   Nitrite NEGATIVE NEGATIVE   Leukocytes, UA SMALL (A) NEGATIVE  Urine microscopic-add on  Result Value Ref Range   Squamous Epithelial / LPF RARE RARE   WBC, UA 0-2 <3 WBC/hpf   RBC / HPF 0-2 <3 RBC/hpf   Bacteria, UA RARE RARE   CHEST 2 VIEW COMPARISON: 02/03/2014 FINDINGS: The heart size and mediastinal contours are within normal limits. Both lungs are clear. The visualized skeletal structures are unremarkable. Remote left lateral fifth and sixth rib fractures are noted. IMPRESSION: No active cardiopulmonary disease. Electronically Signed  By: Conchita Paris M.D.  On: 08/10/2014 17:58    Assessment & Plan:   Problem List Items Addressed This Visit    History of diabetes mellitus - Primary   Relevant Orders   Hemoglobin A1c   Cellulitis of left foot    Anticipate acute uncomplicated cellulitis after skin injury L dorsal foot. Treat with keflex 531m TID course x 1 wk. Update if sxs persist despite treatment.      Preoperative clearance    Discussed upcoming shoulder surgery. He has tolerated GETA in the past, and he currently does not exhibit any concerning symptoms. Will recommend complete treatment for foot cellulitis as above. Anticipate adequately low risk to proceed with surgery pending cardiac clearance by Dr GRockey Situ Reviewed records from resent ER visit for acute diverticulitis (07/2014) - normal CXR and labs. Will not repeat CXR today but did recheck CBC, CMP today.      Relevant Orders   Comprehensive metabolic panel   CBC with Differential/Platelet       Follow up plan: Return if symptoms worsen or fail to improve.

## 2014-11-03 NOTE — Assessment & Plan Note (Signed)
Anticipate acute uncomplicated cellulitis after skin injury L dorsal foot. Treat with keflex 569m TID course x 1 wk. Update if sxs persist despite treatment.

## 2014-11-03 NOTE — Progress Notes (Signed)
Pre visit review using our clinic review tool, if applicable. No additional management support is needed unless otherwise documented below in the visit note. 

## 2014-11-03 NOTE — Patient Instructions (Signed)
I think you have cellulitis of left foot - treat with keflex three times daily for 1 week. I will send note to ortho about your surgery. Blood work today.

## 2014-11-03 NOTE — Assessment & Plan Note (Addendum)
Discussed upcoming shoulder surgery. He has tolerated GETA in the past, and he currently does not exhibit any concerning symptoms. Will recommend complete treatment for foot cellulitis as above. Anticipate adequately low risk to proceed with surgery pending cardiac clearance by Dr [name]. Reviewed records from resent ER visit for acute diverticulitis (07/2014) - normal CXR and labs. Will not repeat CXR today but did recheck CBC, CMP today.

## 2014-11-04 ENCOUNTER — Encounter: Payer: Self-pay | Admitting: *Deleted

## 2014-11-06 ENCOUNTER — Ambulatory Visit (INDEPENDENT_AMBULATORY_CARE_PROVIDER_SITE_OTHER): Payer: Medicare Other | Admitting: Family Medicine

## 2014-11-06 ENCOUNTER — Encounter: Payer: Self-pay | Admitting: Family Medicine

## 2014-11-06 VITALS — BP 140/70 | HR 56 | Temp 97.7°F | Ht 69.0 in | Wt 285.5 lb

## 2014-11-06 DIAGNOSIS — E11329 Type 2 diabetes mellitus with mild nonproliferative diabetic retinopathy without macular edema: Secondary | ICD-10-CM

## 2014-11-06 DIAGNOSIS — I2510 Atherosclerotic heart disease of native coronary artery without angina pectoris: Secondary | ICD-10-CM | POA: Diagnosis not present

## 2014-11-06 DIAGNOSIS — L03116 Cellulitis of left lower limb: Secondary | ICD-10-CM

## 2014-11-06 DIAGNOSIS — E113299 Type 2 diabetes mellitus with mild nonproliferative diabetic retinopathy without macular edema, unspecified eye: Secondary | ICD-10-CM

## 2014-11-06 MED ORDER — CEFTRIAXONE SODIUM 1 G IJ SOLR
1.0000 g | Freq: Once | INTRAMUSCULAR | Status: AC
  Administered 2014-11-06: 1 g via INTRAMUSCULAR

## 2014-11-06 MED ORDER — DOXYCYCLINE HYCLATE 100 MG PO TABS: 100.0000 mg | ORAL_TABLET | Freq: Two times a day (BID) | ORAL | Status: DC

## 2014-11-06 MED ORDER — LEVOFLOXACIN 500 MG PO TABS: 500.0000 mg | ORAL_TABLET | Freq: Every day | ORAL | Status: DC
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
## 2014-11-06 NOTE — Progress Notes (Signed)
Pre visit review using our clinic review tool, if applicable. No additional management support is needed unless otherwise documented below in the visit note. 

## 2014-11-07 ENCOUNTER — Encounter: Payer: Self-pay | Admitting: Family Medicine

## 2014-11-07 ENCOUNTER — Ambulatory Visit (INDEPENDENT_AMBULATORY_CARE_PROVIDER_SITE_OTHER): Payer: Medicare Other | Admitting: Family Medicine

## 2014-11-07 VITALS — BP 160/70 | HR 72 | Temp 98.0°F | Wt 283.2 lb

## 2014-11-07 DIAGNOSIS — L03116 Cellulitis of left lower limb: Secondary | ICD-10-CM

## 2014-11-07 DIAGNOSIS — I2510 Atherosclerotic heart disease of native coronary artery without angina pectoris: Secondary | ICD-10-CM

## 2014-11-07 MED ORDER — DICLOFENAC SODIUM 1 % TD GEL: 2.0000 g | Freq: Three times a day (TID) | TRANSDERMAL | Status: DC | PRN

## 2014-11-07 NOTE — Patient Instructions (Addendum)
I think overall leg is looking unchanged. Continue antibiotics over weekend. Return on Monday for a recheck. Continue elevating leg, continue epsom salt soaks as tolerated. If fever or worsening malaise, nausea, seek urgent care over weekend at ER.   Cellulitis Cellulitis is an infection of the skin and the tissue beneath it. The infected area is usually red and tender. Cellulitis occurs most often in the arms and lower legs.  CAUSES  Cellulitis is caused by bacteria that enter the skin through cracks or cuts in the skin. The most common types of bacteria that cause cellulitis are staphylococci and streptococci. SIGNS AND SYMPTOMS   Redness and warmth.  Swelling.  Tenderness or pain.  Fever. DIAGNOSIS  Your health care provider can usually determine what is wrong based on a physical exam. Blood tests may also be done. TREATMENT  Treatment usually involves taking an antibiotic medicine. HOME CARE INSTRUCTIONS   Take your antibiotic medicine as directed by your health care provider. Finish the antibiotic even if you start to feel better.  Keep the infected arm or leg elevated to reduce swelling.  Apply a warm cloth to the affected area up to 4 times per day to relieve pain.  Take medicines only as directed by your health care provider.  Keep all follow-up visits as directed by your health care provider. SEEK MEDICAL CARE IF:   You notice red streaks coming from the infected area.  Your red area gets larger or turns dark in color.  Your bone or joint underneath the infected area becomes painful after the skin has healed.  Your infection returns in the same area or another area.  You notice a swollen bump in the infected area.  You develop new symptoms.  You have a fever. SEEK IMMEDIATE MEDICAL CARE IF:   You feel very sleepy.  You develop vomiting or diarrhea.  You have a general ill feeling (malaise) with muscle aches and pains. MAKE SURE YOU:   Understand these  instructions.  Will watch your condition.  Will get help right away if you are not doing well or get worse. Document Released: 11/17/2004 Document Revised: 06/24/2013 Document Reviewed: 04/25/2011 [facility] Patient Information 2015 [place], [place]. This information is not intended to replace advice given to you by your health care provider. Make sure you discuss any questions you have with your health care provider.

## 2014-11-07 NOTE — Progress Notes (Signed)
BP 160/70 mmHg  Pulse 72  Temp(Src) 98 F (36.7 C) (Oral)  Wt 283 lb 4 oz (128.481 kg)   CC: f/u visit  Subjective:    Patient ID: Chad [name], male    DOB: 04/14/1955, 59 y.o.   MRN: [id number]  HPI: Chad [name] is a 59 y.o. male presenting on 11/07/2014 for Follow-up   See last few notes for details. Seen here 9/12 with concern for L foot cellulitis - treated with keflex 562m TID x 1 wk. Seen in f/u yesterday 11/06/2014 with worsening pain, swelling, spreading redness. Dr CLorelei Pontchanged abx regimen to levaquin (for pseudomonas coverage) and oxycycline (for MRSA coverage) as well as 1 gm ceftriaxone shot IM.   Has been soaking in epsom saltNo fevers. Feeling nauseated.   Relevant past medical, surgical, family and social history reviewed and updated as indicated. Interim medical history since our last visit reviewed. Allergies and medications reviewed and updated. Current Outpatient Prescriptions on File Prior to Visit  Medication Sig  . albuterol (PROVENTIL HFA;VENTOLIN HFA) 108 (90 BASE) MCG/ACT inhaler Inhale 2 puffs into the lungs every 6 (six) hours as needed for wheezing or shortness of breath.  .Marland Kitchenaspirin EC 81 MG tablet Take 162 mg by mouth daily.    .Marland KitchenCARAFATE 1 GM/10ML suspension TAKE 10ML BY MOUTH 4 TIMES A DAY (Patient taking differently: TAKE 10ML BY MOUTH 4 TIMES A DAY PRN)  . docusate sodium 100 MG CAPS Take 100 mg by mouth daily as needed for mild constipation.  .Marland Kitchendoxycycline (VIBRA-TABS) 100 MG tablet Take 1 tablet (100 mg total) by mouth 2 (two) times daily.  . Flaxseed, Linseed, (FLAX SEED OIL) 1000 MG CAPS Take 1 capsule by mouth 2 (two) times daily.    . furosemide (LASIX) 20 MG tablet Take 1 tablet (20 mg total) by mouth daily as needed for edema.  .Marland KitchenHYDROcodone-acetaminophen (NORCO) 10-325 MG per tablet Take 1 tablet by mouth 3 (three) times daily as needed for moderate pain.  .Marland Kitchenlevofloxacin (LEVAQUIN) 500 MG tablet Take 1 tablet (500 mg total) by mouth  daily.  .Marland Kitchenlevothyroxine (SYNTHROID, LEVOTHROID) 100 MCG tablet Take 1 tablet (100 mcg total) by mouth daily.  . meclizine (ANTIVERT) 25 MG tablet Take 1 tablet (25 mg total) by mouth 2 (two) times daily as needed for dizziness or nausea.  . metoCLOPramide (REGLAN) 10 MG tablet TAKE 1 TABLET BY MOUTH 3 TIMES DAILY BEFORE MEALS  . metoprolol tartrate (LOPRESSOR) 25 MG tablet TAKE 1 TABLET BY MOUTH TWICE A DAY  . Multiple Vitamin (MULITIVITAMIN WITH MINERALS) TABS Take 1 tablet by mouth daily.  . Na Sulfate-K Sulfate-Mg Sulf SOLN Suprep-use as directed  . NEXIUM 40 MG capsule TAKE 1 CAPSULE BY MOUTH TWICE DAILY  . nitroGLYCERIN (NITROSTAT) 0.4 MG SL tablet Place 1 tablet (0.4 mg total) under the tongue every 5 (five) minutes as needed for chest pain.  .Marland Kitchenomega-3 acid ethyl esters (LOVAZA) 1 G capsule TAKE 2 CAPSULES BY MOUTH TWICE A DAY  . ondansetron (ZOFRAN) 4 MG tablet Take 1 tablet (4 mg total) by mouth every 6 (six) hours.  . polyethylene glycol powder (GLYCOLAX/MIRALAX) powder Take 17 g by mouth daily.  . potassium chloride (K-DUR) 10 MEQ tablet Take 10 mEq by mouth daily as needed (ONLY WHEN TAKING FUROSEMIDE).   . Red Yeast Rice 600 MG CAPS Take 1 capsule by mouth 2 (two) times daily.   . traMADol (ULTRAM) 50 MG tablet TAKE 1 TABLET BY  MOUTH TWICE A DAY AS NEEDED   No current facility-administered medications on file prior to visit.    Review of Systems Per HPI unless specifically indicated above     Objective:    BP 160/70 mmHg  Pulse 72  Temp(Src) 98 F (36.7 C) (Oral)  Wt 283 lb 4 oz (128.481 kg)  Wt Readings from Last 3 Encounters:  11/07/14 283 lb 4 oz (128.481 kg)  11/06/14 285 lb 8 oz (129.502 kg)  11/03/14 280 lb 8 oz (127.234 kg)    Physical Exam  Constitutional: He appears well-developed and well-nourished. No distress.  Musculoskeletal: He exhibits edema.  2+ DP bilaterally Sensation intact R foot WNL L dorsal foot with persistent erythema and warmth, mild  erythema extending up lower leg but not past area delineated yesterday, healing abrasion. No interdigital maceration  Skin: Skin is warm and dry. Rash noted. There is erythema.  Blotchy erythematous somewhat papular rash L dorsal foot  Nursing note and vitals reviewed.  Results for orders placed or performed in visit on 11/03/14  Comprehensive metabolic panel  Result Value Ref Range   Sodium 138 135 - 145 mEq/L   Potassium 3.6 3.5 - 5.1 mEq/L   Chloride 103 96 - 112 mEq/L   CO2 28 19 - 32 mEq/L   Glucose, Bld 109 (H) 70 - 99 mg/dL   BUN 23 6 - 23 mg/dL   Creatinine, Ser 0.87 0.40 - 1.50 mg/dL   Total Bilirubin 1.0 0.2 - 1.2 mg/dL   Alkaline Phosphatase 221 (H) 39 - 117 U/L   AST 49 (H) 0 - 37 U/L   ALT 56 (H) 0 - 53 U/L   Total Protein 6.7 6.0 - 8.3 g/dL   Albumin 4.0 3.5 - 5.2 g/dL   Calcium 9.4 8.4 - 10.5 mg/dL   GFR 95.23 >60.00 mL/min  CBC with Differential/Platelet  Result Value Ref Range   WBC 5.0 4.0 - 10.5 K/uL   RBC 5.22 4.22 - 5.81 Mil/uL   Hemoglobin 14.6 13.0 - 17.0 g/dL   HCT 43.0 39.0 - 52.0 %   MCV 82.2 78.0 - 100.0 fl   MCHC 33.9 30.0 - 36.0 g/dL   RDW 14.6 11.5 - 15.5 %   Platelets 152.0 150.0 - 400.0 K/uL   Neutrophils Relative % 67.6 43.0 - 77.0 %   Lymphocytes Relative 17.0 12.0 - 46.0 %   Monocytes Relative 13.5 (H) 3.0 - 12.0 %   Eosinophils Relative 1.5 0.0 - 5.0 %   Basophils Relative 0.4 0.0 - 3.0 %   Neutro Abs 3.4 1.4 - 7.7 K/uL   Lymphs Abs 0.9 0.7 - 4.0 K/uL   Monocytes Absolute 0.7 0.1 - 1.0 K/uL   Eosinophils Absolute 0.1 0.0 - 0.7 K/uL   Basophils Absolute 0.0 0.0 - 0.1 K/uL  Hemoglobin A1c  Result Value Ref Range   Hgb A1c MFr Bld 5.5 4.6 - 6.5 %      Assessment & Plan:   Problem List Items Addressed This Visit    Cellulitis of left foot - Primary    Not improving as well as expected, failed keflex course.  sxs not significantly improved but also likely too early to tell improvement. Regardless, no worsening noted today on exam -  no extension of erythema past delineated line. voltaren gel to foot for inflammation (avoid oral NSAID 2/2 GI history). rec continue current treatment regimen, and recheck on Monday. Red flags to seek ER care over weekend discussed (fever >101, worsening  malaise or nausea, or unable to keep abx down). Pt and wife agree with plan.          Follow up plan: Return in about 3 days (around 11/10/2014) for follow up visit.

## 2014-11-07 NOTE — Assessment & Plan Note (Addendum)
Not improving as well as expected, failed keflex course.  sxs not significantly improved but also likely too early to tell improvement. Regardless, no worsening noted today on exam - no extension of erythema past delineated line. voltaren gel to foot for inflammation (avoid oral NSAID 2/2 GI history). rec continue current treatment regimen, and recheck on Monday. Red flags to seek ER care over weekend discussed (fever >101, worsening malaise or nausea, or unable to keep abx down). Pt and wife agree with plan.

## 2014-11-07 NOTE — Progress Notes (Signed)
Pre visit review using our clinic review tool, if applicable. No additional management support is needed unless otherwise documented below in the visit note. 

## 2014-11-10 ENCOUNTER — Ambulatory Visit (INDEPENDENT_AMBULATORY_CARE_PROVIDER_SITE_OTHER): Payer: Medicare Other | Admitting: Family Medicine

## 2014-11-10 ENCOUNTER — Encounter: Payer: Self-pay | Admitting: Family Medicine

## 2014-11-10 VITALS — BP 132/66 | HR 68 | Temp 97.7°F | Wt 287.0 lb

## 2014-11-10 DIAGNOSIS — L03116 Cellulitis of left lower limb: Secondary | ICD-10-CM | POA: Diagnosis not present

## 2014-11-10 DIAGNOSIS — I2510 Atherosclerotic heart disease of native coronary artery without angina pectoris: Secondary | ICD-10-CM

## 2014-11-10 NOTE — Progress Notes (Signed)
Pre visit review using our clinic review tool, if applicable. No additional management support is needed unless otherwise documented below in the visit note. 

## 2014-11-10 NOTE — Progress Notes (Signed)
BP 132/66 mmHg  Pulse 68  Temp(Src) 97.7 F (36.5 C) (Oral)  Wt 287 lb (130.182 kg)   CC: f/u cellulitis  Subjective:    Patient ID: Chad [name], male    DOB: [birth year], 59 y.o.   MRN: [id number]  HPI: CARLON CHALOUX is a 59 y.o. male presenting on 11/10/2014 for Follow-up   See last few notes for details. Seen here 9/12 with concern for L foot cellulitis - treated with keflex 545m TID x 1 wk. Seen in f/u yesterday 11/06/2014 with worsening pain, swelling, spreading redness. Dr CLorelei Pontchanged abx regimen to levaquin (for pseudomonas coverage) and oxycycline (for MRSA coverage) as well as 1 gm ceftriaxone shot IM. Seen again 9/16 - rec stay the course.   Has been soaking in epsom salt and using moisturizer. Initially used voltaren gel. No fevers. Feeling nauseated.   Relevant past medical, surgical, family and social history reviewed and updated as indicated. Interim medical history since our last visit reviewed. Allergies and medications reviewed and updated. Current Outpatient Prescriptions on File Prior to Visit  Medication Sig  . albuterol (PROVENTIL HFA;VENTOLIN HFA) 108 (90 BASE) MCG/ACT inhaler Inhale 2 puffs into the lungs every 6 (six) hours as needed for wheezing or shortness of breath.  .Marland Kitchenaspirin EC 81 MG tablet Take 162 mg by mouth daily.    .Marland KitchenCARAFATE 1 GM/10ML suspension TAKE 10ML BY MOUTH 4 TIMES A DAY (Patient taking differently: TAKE 10ML BY MOUTH 4 TIMES A DAY PRN)  . diclofenac sodium (VOLTAREN) 1 % GEL Apply 2 g topically 3 (three) times daily as needed.  . docusate sodium 100 MG CAPS Take 100 mg by mouth daily as needed for mild constipation.  .Marland Kitchendoxycycline (VIBRA-TABS) 100 MG tablet Take 1 tablet (100 mg total) by mouth 2 (two) times daily.  . Flaxseed, Linseed, (FLAX SEED OIL) 1000 MG CAPS Take 1 capsule by mouth 2 (two) times daily.    . furosemide (LASIX) 20 MG tablet Take 1 tablet (20 mg total) by mouth daily as needed for edema.  .Marland Kitchen HYDROcodone-acetaminophen (NORCO) 10-325 MG per tablet Take 1 tablet by mouth 3 (three) times daily as needed for moderate pain.  .Marland Kitchenlevofloxacin (LEVAQUIN) 500 MG tablet Take 1 tablet (500 mg total) by mouth daily.  .Marland Kitchenlevothyroxine (SYNTHROID, LEVOTHROID) 100 MCG tablet Take 1 tablet (100 mcg total) by mouth daily.  . meclizine (ANTIVERT) 25 MG tablet Take 1 tablet (25 mg total) by mouth 2 (two) times daily as needed for dizziness or nausea.  . metoCLOPramide (REGLAN) 10 MG tablet TAKE 1 TABLET BY MOUTH 3 TIMES DAILY BEFORE MEALS  . metoprolol tartrate (LOPRESSOR) 25 MG tablet TAKE 1 TABLET BY MOUTH TWICE A DAY  . Multiple Vitamin (MULITIVITAMIN WITH MINERALS) TABS Take 1 tablet by mouth daily.  . Na Sulfate-K Sulfate-Mg Sulf SOLN Suprep-use as directed  . NEXIUM 40 MG capsule TAKE 1 CAPSULE BY MOUTH TWICE DAILY  . nitroGLYCERIN (NITROSTAT) 0.4 MG SL tablet Place 1 tablet (0.4 mg total) under the tongue every 5 (five) minutes as needed for chest pain.  .Marland Kitchenomega-3 acid ethyl esters (LOVAZA) 1 G capsule TAKE 2 CAPSULES BY MOUTH TWICE A DAY  . ondansetron (ZOFRAN) 4 MG tablet Take 1 tablet (4 mg total) by mouth every 6 (six) hours.  . polyethylene glycol powder (GLYCOLAX/MIRALAX) powder Take 17 g by mouth daily.  . potassium chloride (K-DUR) 10 MEQ tablet Take 10 mEq by mouth daily as needed (ONLY WHEN  TAKING FUROSEMIDE).   . Red Yeast Rice 600 MG CAPS Take 1 capsule by mouth 2 (two) times daily.   . traMADol (ULTRAM) 50 MG tablet TAKE 1 TABLET BY MOUTH TWICE A DAY AS NEEDED   No current facility-administered medications on file prior to visit.    Review of Systems Per HPI unless specifically indicated above     Objective:    BP 132/66 mmHg  Pulse 68  Temp(Src) 97.7 F (36.5 C) (Oral)  Wt 287 lb (130.182 kg)  Wt Readings from Last 3 Encounters:  11/10/14 287 lb (130.182 kg)  11/07/14 283 lb 4 oz (128.481 kg)  11/06/14 285 lb 8 oz (129.502 kg)    Physical Exam  Constitutional: He  appears well-developed and well-nourished. No distress.  Musculoskeletal: He exhibits edema (1+ dorsal left foot, improved).  Pulses intact  Skin: Skin is warm and dry. Rash noted. No erythema.  Scaling left dorsal foot, erythema markedly improved, no longer painful.  Psychiatric: He has a normal mood and affect.  Nursing note and vitals reviewed.     Assessment & Plan:   Problem List Items Addressed This Visit    Cellulitis of left foot - Primary    Marked improvement - states abx kicked in on Saturday and noted marked improvement since then. rec finish levaquin/doxy course, update if not continuing to improve each day. Pt agrees with plan.          Follow up plan: No Follow-up on file.

## 2014-11-10 NOTE — Assessment & Plan Note (Signed)
Marked improvement - states abx kicked in on Saturday and noted marked improvement since then. rec finish levaquin/doxy course, update if not continuing to improve each day. Pt agrees with plan.

## 2014-11-11 ENCOUNTER — Telehealth: Payer: Self-pay

## 2014-11-11 NOTE — Telephone Encounter (Signed)
Received cardiac clearance request for pt to proceed w/ left shoulder scope & rotator cuff repair. Per Dr. [name], pt is cleared for surgery from a medical & cardiac standpoint. Faxed clearance to [facility] [phone].

## 2014-11-17 ENCOUNTER — Other Ambulatory Visit: Payer: Self-pay | Admitting: Family Medicine

## 2014-11-17 NOTE — Telephone Encounter (Signed)
plz phone in. 

## 2014-11-17 NOTE — Telephone Encounter (Signed)
Ok to refill 

## 2014-11-18 NOTE — Telephone Encounter (Signed)
Rx called in as directed.   

## 2014-11-19 DIAGNOSIS — S46012A Strain of muscle(s) and tendon(s) of the rotator cuff of left shoulder, initial encounter: Secondary | ICD-10-CM | POA: Diagnosis not present

## 2014-11-19 DIAGNOSIS — M7552 Bursitis of left shoulder: Secondary | ICD-10-CM | POA: Diagnosis not present

## 2014-11-19 DIAGNOSIS — M19012 Primary osteoarthritis, left shoulder: Secondary | ICD-10-CM | POA: Diagnosis not present

## 2014-11-19 DIAGNOSIS — M7542 Impingement syndrome of left shoulder: Secondary | ICD-10-CM | POA: Diagnosis not present

## 2014-11-19 DIAGNOSIS — G8918 Other acute postprocedural pain: Secondary | ICD-10-CM | POA: Diagnosis not present

## 2014-11-19 DIAGNOSIS — M75112 Incomplete rotator cuff tear or rupture of left shoulder, not specified as traumatic: Secondary | ICD-10-CM | POA: Diagnosis not present

## 2014-11-19 DIAGNOSIS — M24112 Other articular cartilage disorders, left shoulder: Secondary | ICD-10-CM | POA: Diagnosis not present

## 2014-11-21 ENCOUNTER — Telehealth: Payer: Self-pay | Admitting: *Deleted

## 2014-11-21 DIAGNOSIS — Z5181 Encounter for therapeutic drug level monitoring: Secondary | ICD-10-CM

## 2014-11-21 DIAGNOSIS — M25612 Stiffness of left shoulder, not elsewhere classified: Secondary | ICD-10-CM | POA: Diagnosis not present

## 2014-11-21 DIAGNOSIS — M7542 Impingement syndrome of left shoulder: Secondary | ICD-10-CM | POA: Diagnosis not present

## 2014-11-21 DIAGNOSIS — R531 Weakness: Secondary | ICD-10-CM | POA: Diagnosis not present

## 2014-11-21 DIAGNOSIS — E039 Hypothyroidism, unspecified: Secondary | ICD-10-CM

## 2014-11-21 DIAGNOSIS — M25512 Pain in left shoulder: Secondary | ICD-10-CM | POA: Diagnosis not present

## 2014-11-21 NOTE — Telephone Encounter (Signed)
Thank you :)

## 2014-11-21 NOTE — Telephone Encounter (Signed)
Notice from pharmacy received that there was a manufacturer change in his levothyroxine. Message left notifying patient to schedule labs for 6 weeks. Lab ordered.

## 2014-11-24 DIAGNOSIS — M25512 Pain in left shoulder: Secondary | ICD-10-CM | POA: Diagnosis not present

## 2014-11-24 DIAGNOSIS — M25612 Stiffness of left shoulder, not elsewhere classified: Secondary | ICD-10-CM | POA: Diagnosis not present

## 2014-11-24 DIAGNOSIS — R531 Weakness: Secondary | ICD-10-CM | POA: Diagnosis not present

## 2014-11-24 DIAGNOSIS — M7542 Impingement syndrome of left shoulder: Secondary | ICD-10-CM | POA: Diagnosis not present

## 2014-11-26 DIAGNOSIS — R531 Weakness: Secondary | ICD-10-CM | POA: Diagnosis not present

## 2014-11-26 DIAGNOSIS — M7542 Impingement syndrome of left shoulder: Secondary | ICD-10-CM | POA: Diagnosis not present

## 2014-11-26 DIAGNOSIS — M25612 Stiffness of left shoulder, not elsewhere classified: Secondary | ICD-10-CM | POA: Diagnosis not present

## 2014-11-26 DIAGNOSIS — M25512 Pain in left shoulder: Secondary | ICD-10-CM | POA: Diagnosis not present

## 2014-11-27 DIAGNOSIS — S46012D Strain of muscle(s) and tendon(s) of the rotator cuff of left shoulder, subsequent encounter: Secondary | ICD-10-CM | POA: Diagnosis not present

## 2014-11-29 ENCOUNTER — Other Ambulatory Visit: Payer: Self-pay | Admitting: Family Medicine

## 2014-12-01 NOTE — Telephone Encounter (Signed)
Ok to refill 

## 2014-12-03 DIAGNOSIS — M25512 Pain in left shoulder: Secondary | ICD-10-CM | POA: Diagnosis not present

## 2014-12-03 DIAGNOSIS — M25612 Stiffness of left shoulder, not elsewhere classified: Secondary | ICD-10-CM | POA: Diagnosis not present

## 2014-12-03 DIAGNOSIS — R531 Weakness: Secondary | ICD-10-CM | POA: Diagnosis not present

## 2014-12-03 DIAGNOSIS — M7542 Impingement syndrome of left shoulder: Secondary | ICD-10-CM | POA: Diagnosis not present

## 2014-12-04 ENCOUNTER — Other Ambulatory Visit: Payer: Self-pay | Admitting: *Deleted

## 2014-12-04 NOTE — Telephone Encounter (Signed)
Pt's wife left voicemail at Triage requesting refill of Rx, last refilled on  10/31/14 #[phone] 0 refills

## 2014-12-05 DIAGNOSIS — M25612 Stiffness of left shoulder, not elsewhere classified: Secondary | ICD-10-CM | POA: Diagnosis not present

## 2014-12-05 DIAGNOSIS — M7542 Impingement syndrome of left shoulder: Secondary | ICD-10-CM | POA: Diagnosis not present

## 2014-12-05 DIAGNOSIS — R531 Weakness: Secondary | ICD-10-CM | POA: Diagnosis not present

## 2014-12-05 DIAGNOSIS — M25512 Pain in left shoulder: Secondary | ICD-10-CM | POA: Diagnosis not present

## 2014-12-08 DIAGNOSIS — M7542 Impingement syndrome of left shoulder: Secondary | ICD-10-CM | POA: Diagnosis not present

## 2014-12-08 DIAGNOSIS — M25612 Stiffness of left shoulder, not elsewhere classified: Secondary | ICD-10-CM | POA: Diagnosis not present

## 2014-12-08 DIAGNOSIS — R531 Weakness: Secondary | ICD-10-CM | POA: Diagnosis not present

## 2014-12-08 DIAGNOSIS — M25512 Pain in left shoulder: Secondary | ICD-10-CM | POA: Diagnosis not present

## 2014-12-08 MED ORDER — HYDROCODONE-ACETAMINOPHEN 10-325 MG PO TABS: 1.0000 | ORAL_TABLET | Freq: Three times a day (TID) | ORAL | Status: DC | PRN

## 2014-12-08 NOTE — Telephone Encounter (Signed)
Message left advising patient and Rx placed up front for pick up.

## 2014-12-08 NOTE — Telephone Encounter (Signed)
Printed and in Kim's box.

## 2014-12-11 DIAGNOSIS — M25612 Stiffness of left shoulder, not elsewhere classified: Secondary | ICD-10-CM | POA: Diagnosis not present

## 2014-12-11 DIAGNOSIS — R531 Weakness: Secondary | ICD-10-CM | POA: Diagnosis not present

## 2014-12-11 DIAGNOSIS — M25512 Pain in left shoulder: Secondary | ICD-10-CM | POA: Diagnosis not present

## 2014-12-11 DIAGNOSIS — M7542 Impingement syndrome of left shoulder: Secondary | ICD-10-CM | POA: Diagnosis not present

## 2014-12-15 DIAGNOSIS — M7542 Impingement syndrome of left shoulder: Secondary | ICD-10-CM | POA: Diagnosis not present

## 2014-12-15 DIAGNOSIS — M25512 Pain in left shoulder: Secondary | ICD-10-CM | POA: Diagnosis not present

## 2014-12-15 DIAGNOSIS — R531 Weakness: Secondary | ICD-10-CM | POA: Diagnosis not present

## 2014-12-15 DIAGNOSIS — M25612 Stiffness of left shoulder, not elsewhere classified: Secondary | ICD-10-CM | POA: Diagnosis not present

## 2014-12-17 ENCOUNTER — Encounter: Payer: Self-pay | Admitting: Internal Medicine

## 2014-12-17 ENCOUNTER — Ambulatory Visit (AMBULATORY_SURGERY_CENTER): Payer: Medicare Other | Admitting: Internal Medicine

## 2014-12-17 VITALS — BP 161/72 | HR 57 | Temp 95.9°F | Resp 31 | Ht 69.0 in | Wt 282.0 lb

## 2014-12-17 DIAGNOSIS — G4733 Obstructive sleep apnea (adult) (pediatric): Secondary | ICD-10-CM | POA: Diagnosis not present

## 2014-12-17 DIAGNOSIS — D124 Benign neoplasm of descending colon: Secondary | ICD-10-CM

## 2014-12-17 DIAGNOSIS — I2510 Atherosclerotic heart disease of native coronary artery without angina pectoris: Secondary | ICD-10-CM | POA: Diagnosis not present

## 2014-12-17 DIAGNOSIS — I509 Heart failure, unspecified: Secondary | ICD-10-CM | POA: Diagnosis not present

## 2014-12-17 DIAGNOSIS — D123 Benign neoplasm of transverse colon: Secondary | ICD-10-CM | POA: Diagnosis not present

## 2014-12-17 DIAGNOSIS — E119 Type 2 diabetes mellitus without complications: Secondary | ICD-10-CM | POA: Diagnosis not present

## 2014-12-17 DIAGNOSIS — Z1211 Encounter for screening for malignant neoplasm of colon: Secondary | ICD-10-CM | POA: Diagnosis not present

## 2014-12-17 DIAGNOSIS — Z886 Allergy status to analgesic agent status: Secondary | ICD-10-CM | POA: Diagnosis not present

## 2014-12-17 DIAGNOSIS — I10 Essential (primary) hypertension: Secondary | ICD-10-CM | POA: Diagnosis not present

## 2014-12-17 DIAGNOSIS — Z6841 Body Mass Index (BMI) 40.0 and over, adult: Secondary | ICD-10-CM | POA: Diagnosis not present

## 2014-12-17 MED ORDER — SODIUM CHLORIDE 0.9 % IV SOLN: 500.0000 mL | INTRAVENOUS | Status: DC

## 2014-12-17 NOTE — Progress Notes (Signed)
  [facility] Post-op Note  Patient: Chad [name]  Procedure(s) Performed: colonoscopy  Patient Location: [facility] - Recovery Area  Anesthesia Type: Deep Sedation/Propofol  Level of Consciousness: awake, oriented and patient cooperative  Airway and Oxygen Therapy: Patient Spontanous Breathing  Post-op Pain: none  Post-op Assessment:  Post-op Vital signs reviewed, Patient's Cardiovascular Status Stable, Respiratory Function Stable, Patent Airway, No signs of Nausea or vomiting and Pain level controlled  Post-op Vital Signs: Reviewed and stable  Complications: No apparent anesthesia complications  Jibran Crookshanks E 3:26 PM

## 2014-12-17 NOTE — Progress Notes (Signed)
Called to room to assist during endoscopic procedure.  Patient ID and intended procedure confirmed with present staff. Received instructions for my participation in the procedure from the performing physician.  

## 2014-12-17 NOTE — Patient Instructions (Addendum)
YOU HAD AN ENDOSCOPIC PROCEDURE TODAY AT [facility]:   Refer to the procedure report that was given to you for any specific questions about what was found during the examination.  If the procedure report does not answer your questions, please call your gastroenterologist to clarify.  If you requested that your care partner not be given the details of your procedure findings, then the procedure report has been included in a sealed envelope for you to review at your convenience later.  YOU SHOULD EXPECT: Some feelings of bloating in the abdomen. Passage of more gas than usual.  Walking can help get rid of the air that was put into your GI tract during the procedure and reduce the bloating. If you had a lower endoscopy (such as a colonoscopy or flexible sigmoidoscopy) you may notice spotting of blood in your stool or on the toilet paper. If you underwent a bowel prep for your procedure, you may not have a normal bowel movement for a few days.  Please Note:  You might notice some irritation and congestion in your nose or some drainage.  This is from the oxygen used during your procedure.  There is no need for concern and it should clear up in a day or so.  SYMPTOMS TO REPORT IMMEDIATELY:   Following lower endoscopy (colonoscopy or flexible sigmoidoscopy):  Excessive amounts of blood in the stool  Significant tenderness or worsening of abdominal pains  Swelling of the abdomen that is new, acute  Fever of 100F or higher   For urgent or emergent issues, a gastroenterologist can be reached at any hour by calling [phone].   DIET: Your first meal following the procedure should be a small meal and then it is ok to progress to your normal diet. Heavy or fried foods are harder to digest and may make you feel nauseous or bloated.  Likewise, meals heavy in dairy and vegetables can increase bloating.  Drink plenty of fluids but you should avoid alcoholic beverages for 24  hours.  ACTIVITY:  You should plan to take it easy for the rest of today and you should NOT DRIVE or use heavy machinery until tomorrow (because of the sedation medicines used during the test).    FOLLOW UP: Our staff will call the number listed on your records the next business day following your procedure to check on you and address any questions or concerns that you may have regarding the information given to you following your procedure. If we do not reach you, we will leave a message.  However, if you are feeling well and you are not experiencing any problems, there is no need to return our call.  We will assume that you have returned to your regular daily activities without incident.  If any biopsies were taken you will be contacted by phone or by letter within the next 1-3 weeks.  Please call us at [phone] if you have not heard about the biopsies in 3 weeks.    SIGNATURES/CONFIDENTIALITY: You and/or your care partner have signed paperwork which will be entered into your electronic medical record.  These signatures attest to the fact that that the information above on your After Visit Summary has been reviewed and is understood.  Full responsibility of the confidentiality of this discharge information lies with you and/or your care-partner.  Polyp, diverticulosis, high fiber diet, and hemorrhoid information.  Avoid all NSAIDS for 2 weeks.  Hemorrhoid banding information given.

## 2014-12-17 NOTE — Op Note (Signed)
[facility]  [address] [place], [address]   COLONOSCOPY PROCEDURE REPORT  PATIENT: Jaivion, Kingsley  MR#: [id number] BIRTHDATE: 18-May-1955 , 45  yrs. old GENDER: male ENDOSCOPIST: Jerene Bears, MD PROCEDURE DATE:  12/17/2014 PROCEDURE:   Colonoscopy, screening, Colonoscopy with snare polypectomy, and Colonoscopy with cold biopsy polypectomy First Screening Colonoscopy - Avg.  risk and is 50 yrs.  old or older - No.  Prior Negative Screening - Now for repeat screening. N/A  History of Adenoma - Now for follow-up colonoscopy & has been > or = to 3 yrs.  N/A  Polyps removed today? Yes ASA CLASS:   Class III INDICATIONS:Screening for colonic neoplasia, Colorectal Neoplasm Risk Assessment for this procedure is average risk, and history of diverticulitis, resolved. MEDICATIONS: Monitored anesthesia care and Propofol 400 mg IV  DESCRIPTION OF PROCEDURE:   After the risks benefits and alternatives of the procedure were thoroughly explained, informed consent was obtained.  The digital rectal exam revealed no rectal mass.   The LB [id number]  endoscope was introduced through the anus and advanced to the cecum, which was identified by both the appendix and ileocecal valve. No adverse events experienced. The quality of the prep was good.  (Suprep was used)  The instrument was then slowly withdrawn as the colon was fully examined. Estimated blood loss is zero unless otherwise noted in this procedure report.   COLON FINDINGS: Four sessile polyps ranging from 3 to 67m in size were found in the transverse colon (2) and descending colon (2). Polypectomies were performed using snare cautery (1 in descending), with a cold snare (2) and with cold forceps (1).  The resection was complete, the polyp tissue was completely retrieved and sent to histology.   There was moderate diverticulosis noted in the descending colon and sigmoid colon.   The examination was  otherwise normal.  Retroflexed views revealed internal hemorrhoids. The time to cecum = 2.9 Withdrawal time = 22.4   The scope was withdrawn and the procedure completed.  COMPLICATIONS: There were no immediate complications.     ENDOSCOPIC IMPRESSION: 1.   Four sessile polyps ranging from 3 to 170min size were found in the transverse colon and descending colon; polypectomies were performed using snare cautery, with a cold snare and with cold forceps 2.   Moderate diverticulosis was noted in the descending colon and sigmoid colon 3.   The examination was otherwise normal  RECOMMENDATIONS: 1.  Avoid all NSAIDs for the next 2 weeks. 2.  Await pathology results 3.  High fiber diet 4.  If the polyps removed today are proven to be adenomatous (pre-cancerous) polyps, you will need a colonoscopy in 3 years. Otherwise you should continue to follow colorectal cancer screening guidelines for "routine risk" patients with a colonoscopy in 10 years.  You will receive a letter within 1-2 weeks with the results of your biopsy as well as final recommendations.  Please call my office if you have not received a letter after 3 weeks.  eSigned:  JaJerene BearsMD 12/17/2014 3:25 PM   cc: GuRia BushD and The Patient   PATIENT NAME:  RaForest, RedwineR#: [id number]

## 2014-12-17 NOTE — Progress Notes (Signed)
Patient denies any allergies to eggs or soy.

## 2014-12-18 ENCOUNTER — Telehealth: Payer: Self-pay | Admitting: *Deleted

## 2014-12-18 DIAGNOSIS — M1711 Unilateral primary osteoarthritis, right knee: Secondary | ICD-10-CM | POA: Diagnosis not present

## 2014-12-18 NOTE — Telephone Encounter (Signed)
  Follow up Call-  Call back number 12/17/2014  Post procedure Call Back phone  # [phone]  Permission to leave phone message Yes     Patient questions:  Do you have a fever, pain , or abdominal swelling? No. Pain Score  0 *  Have you tolerated food without any problems? Yes.    Have you been able to return to your normal activities? Yes.    Do you have any questions about your discharge instructions: Diet   No. Medications  No. Follow up visit  No.  Do you have questions or concerns about your Care? No.  Actions: * If pain score is 4 or above: No action needed, pain <4.

## 2014-12-19 ENCOUNTER — Other Ambulatory Visit: Payer: Self-pay | Admitting: Family Medicine

## 2014-12-19 DIAGNOSIS — M25512 Pain in left shoulder: Secondary | ICD-10-CM | POA: Diagnosis not present

## 2014-12-19 DIAGNOSIS — R531 Weakness: Secondary | ICD-10-CM | POA: Diagnosis not present

## 2014-12-19 DIAGNOSIS — M25612 Stiffness of left shoulder, not elsewhere classified: Secondary | ICD-10-CM | POA: Diagnosis not present

## 2014-12-19 DIAGNOSIS — M7542 Impingement syndrome of left shoulder: Secondary | ICD-10-CM | POA: Diagnosis not present

## 2014-12-22 DIAGNOSIS — M25512 Pain in left shoulder: Secondary | ICD-10-CM | POA: Diagnosis not present

## 2014-12-22 DIAGNOSIS — R531 Weakness: Secondary | ICD-10-CM | POA: Diagnosis not present

## 2014-12-22 DIAGNOSIS — M7542 Impingement syndrome of left shoulder: Secondary | ICD-10-CM | POA: Diagnosis not present

## 2014-12-22 DIAGNOSIS — M25612 Stiffness of left shoulder, not elsewhere classified: Secondary | ICD-10-CM | POA: Diagnosis not present

## 2014-12-23 HISTORY — PX: COLONOSCOPY: SHX174

## 2014-12-24 ENCOUNTER — Encounter: Payer: Self-pay | Admitting: Internal Medicine

## 2014-12-24 DIAGNOSIS — M7542 Impingement syndrome of left shoulder: Secondary | ICD-10-CM | POA: Diagnosis not present

## 2014-12-24 DIAGNOSIS — M25512 Pain in left shoulder: Secondary | ICD-10-CM | POA: Diagnosis not present

## 2014-12-24 DIAGNOSIS — M25612 Stiffness of left shoulder, not elsewhere classified: Secondary | ICD-10-CM | POA: Diagnosis not present

## 2014-12-24 DIAGNOSIS — R531 Weakness: Secondary | ICD-10-CM | POA: Diagnosis not present

## 2014-12-26 ENCOUNTER — Encounter: Payer: Self-pay | Admitting: Family Medicine

## 2014-12-29 ENCOUNTER — Other Ambulatory Visit (INDEPENDENT_AMBULATORY_CARE_PROVIDER_SITE_OTHER): Payer: Medicare Other

## 2014-12-29 ENCOUNTER — Ambulatory Visit (INDEPENDENT_AMBULATORY_CARE_PROVIDER_SITE_OTHER): Payer: Medicare Other | Admitting: Family Medicine

## 2014-12-29 ENCOUNTER — Encounter: Payer: Self-pay | Admitting: Family Medicine

## 2014-12-29 VITALS — BP 130/70 | HR 68 | Temp 97.8°F | Wt 298.0 lb

## 2014-12-29 DIAGNOSIS — M7542 Impingement syndrome of left shoulder: Secondary | ICD-10-CM | POA: Diagnosis not present

## 2014-12-29 DIAGNOSIS — K3184 Gastroparesis: Secondary | ICD-10-CM

## 2014-12-29 DIAGNOSIS — E039 Hypothyroidism, unspecified: Secondary | ICD-10-CM | POA: Diagnosis not present

## 2014-12-29 DIAGNOSIS — I10 Essential (primary) hypertension: Secondary | ICD-10-CM

## 2014-12-29 DIAGNOSIS — E113299 Type 2 diabetes mellitus with mild nonproliferative diabetic retinopathy without macular edema, unspecified eye: Secondary | ICD-10-CM

## 2014-12-29 DIAGNOSIS — R892 Abnormal level of other drugs, medicaments and biological substances in specimens from other organs, systems and tissues: Secondary | ICD-10-CM

## 2014-12-29 DIAGNOSIS — I2510 Atherosclerotic heart disease of native coronary artery without angina pectoris: Secondary | ICD-10-CM

## 2014-12-29 DIAGNOSIS — I5032 Chronic diastolic (congestive) heart failure: Secondary | ICD-10-CM

## 2014-12-29 DIAGNOSIS — M25612 Stiffness of left shoulder, not elsewhere classified: Secondary | ICD-10-CM | POA: Diagnosis not present

## 2014-12-29 DIAGNOSIS — R531 Weakness: Secondary | ICD-10-CM | POA: Diagnosis not present

## 2014-12-29 DIAGNOSIS — E1143 Type 2 diabetes mellitus with diabetic autonomic (poly)neuropathy: Secondary | ICD-10-CM

## 2014-12-29 DIAGNOSIS — M25512 Pain in left shoulder: Secondary | ICD-10-CM | POA: Diagnosis not present

## 2014-12-29 LAB — T4, FREE: FREE T4: 0.92 ng/dL (ref 0.60–1.60)

## 2014-12-29 LAB — TSH: TSH: 2.86 u[IU]/mL (ref 0.35–4.50)

## 2014-12-29 NOTE — Assessment & Plan Note (Signed)
Chronic, stable. Continue current regimen. 

## 2014-12-29 NOTE — Assessment & Plan Note (Signed)
Consider trial off reglan with improvement in glycemic control.

## 2014-12-29 NOTE — Assessment & Plan Note (Signed)
Recheck TSH/free T4 after recent change in levothyroxine formulary.

## 2014-12-29 NOTE — Patient Instructions (Addendum)
Urine drug screen today. Schedule eye exam at your convenience as you're due.  labwork today. Return in 4 months for medicare wellness visit.

## 2014-12-29 NOTE — Assessment & Plan Note (Signed)
Endorses great control today. Due for eye exam - encouraged he schedule this. Off all antihyperglycemics after successful weight loss.

## 2014-12-29 NOTE — Progress Notes (Signed)
Pre visit review using our clinic review tool, if applicable. No additional management support is needed unless otherwise documented below in the visit note. 

## 2014-12-29 NOTE — Assessment & Plan Note (Signed)
20lb weight gain noted. Discussed healthy diet and continued exercise. Difficulty staying as active as he'd like 2/2 recent MSK conditions (L knee surgery, R leg pain). Followed by ortho.

## 2014-12-29 NOTE — Progress Notes (Signed)
BP 130/70 mmHg  Pulse 68  Temp(Src) 97.8 F (36.6 C) (Oral)  Wt 298 lb (135.172 kg)   CC: 4 mo f/u visit  Subjective:    Patient ID: Chad [name], male    DOB: March 07, 1955, 59 y.o.   MRN: [id number]  HPI: Chad [name] is a 59 y.o. male presenting on 12/29/2014 for Follow-up   Has completely improved from prior L foot cellulitis.   Recent colonoscopy by Dr Hilarie Fredrickson - rec rpt 3 yrs   Recent change in levothyroxine formulation - due to recheck TFTs today,  Recently seen by Dr Noemi Chapel and placed on prednisone for right leg pain. Tomorrow is last day of prednisone. Using L arm sling for shoulder s/p RTC repair 6 wks ago.   H/o abnormal UDS - negative hydrocodone and tramadol despite regular fills x2. Hydrocodone 10/358m filled 10/17 (#60), tramadol 544mfilled 9/26 (#30). Will rpt today. Endorses regular use. Takes tramadol during the day and hydrocodone at night time. Last night didn't take hydrocodone.   Obesity - 20lb weight gain noted over last 4 months. Continues exercising - walking regularly despite R leg pain, riding bicycle at gym.   DM - regularly does check sugars and running well controlled. Compliant with antihyperglycemic regimen which includes: diet controlled.  Denies low sugars or hypoglycemic symptoms.  Denies paresthesias. Last diabetic eye exam 09/2013.  Pneumovax: 08/2011.  Prevnar: not due. Lab Results  Component Value Date   HGBA1C 5.5 11/03/2014    Relevant past medical, surgical, family and social history reviewed and updated as indicated. Interim medical history since our last visit reviewed. Allergies and medications reviewed and updated. Current Outpatient Prescriptions on File Prior to Visit  Medication Sig  . albuterol (PROVENTIL HFA;VENTOLIN HFA) 108 (90 BASE) MCG/ACT inhaler Inhale 2 puffs into the lungs every 6 (six) hours as needed for wheezing or shortness of breath.  . Marland Kitchenspirin EC 81 MG tablet Take 162 mg by mouth daily.    . Marland KitchenARAFATE 1 GM/10ML  suspension TAKE 10ML BY MOUTH 4 TIMES A DAY (Patient taking differently: TAKE 10ML BY MOUTH 4 TIMES A DAY PRN)  . diclofenac sodium (VOLTAREN) 1 % GEL Apply 2 g topically 3 (three) times daily as needed.  . docusate sodium 100 MG CAPS Take 100 mg by mouth daily as needed for mild constipation.  . Flaxseed, Linseed, (FLAX SEED OIL) 1000 MG CAPS Take 1 capsule by mouth 2 (two) times daily.    . furosemide (LASIX) 20 MG tablet Take 1 tablet (20 mg total) by mouth daily as needed for edema.  . Marland KitchenYDROcodone-acetaminophen (NORCO) 10-325 MG tablet Take 1 tablet by mouth 3 (three) times daily as needed for moderate pain.  . Marland Kitchenevothyroxine (SYNTHROID, LEVOTHROID) 100 MCG tablet Take 1 tablet (100 mcg total) by mouth daily.  . meclizine (ANTIVERT) 25 MG tablet Take 1 tablet (25 mg total) by mouth 2 (two) times daily as needed for dizziness or nausea.  . metoCLOPramide (REGLAN) 10 MG tablet TAKE ONE TABLET BY MOUTH THREE TIMES A DAY BEFORE MEALS  . metoprolol tartrate (LOPRESSOR) 25 MG tablet TAKE 1 TABLET BY MOUTH TWICE A DAY  . Multiple Vitamin (MULITIVITAMIN WITH MINERALS) TABS Take 1 tablet by mouth daily.  . Marland KitchenEXIUM 40 MG capsule TAKE 1 CAPSULE BY MOUTH TWICE DAILY  . nitroGLYCERIN (NITROSTAT) 0.4 MG SL tablet Place 1 tablet (0.4 mg total) under the tongue every 5 (five) minutes as needed for chest pain.  . Marland Kitchenmega-3 acid ethyl esters (LOVAZA)  1 G capsule TAKE 2 CAPSULES BY MOUTH TWICE A DAY  . ondansetron (ZOFRAN) 4 MG tablet Take 1 tablet (4 mg total) by mouth every 6 (six) hours.  . polyethylene glycol powder (GLYCOLAX/MIRALAX) powder Take 17 g by mouth daily.  . potassium chloride (K-DUR) 10 MEQ tablet Take 10 mEq by mouth daily as needed (ONLY WHEN TAKING FUROSEMIDE).   . Red Yeast Rice 600 MG CAPS Take 1 capsule by mouth 2 (two) times daily.   . traMADol (ULTRAM) 50 MG tablet TAKE 1 TABLET BY MOUTH TWICE A DAY AS NEEDED   No current facility-administered medications on file prior to visit.     Review of Systems Per HPI unless specifically indicated in ROS section     Objective:    BP 130/70 mmHg  Pulse 68  Temp(Src) 97.8 F (36.6 C) (Oral)  Wt 298 lb (135.172 kg)  Wt Readings from Last 3 Encounters:  12/29/14 298 lb (135.172 kg)  12/17/14 282 lb (127.914 kg)  11/10/14 287 lb (130.182 kg)   Body mass index is 43.99 kg/(m^2).  Physical Exam  Constitutional: He appears well-developed and well-nourished. No distress.  HENT:  Head: Normocephalic and atraumatic.  Nose: Nose normal.  Mouth/Throat: Oropharynx is clear and moist. No oropharyngeal exudate.  Eyes: Conjunctivae and EOM are normal. Pupils are equal, round, and reactive to light. No scleral icterus.  Neck: Normal range of motion. Neck supple.  Cardiovascular: Normal rate, regular rhythm, normal heart sounds and intact distal pulses.   No murmur heard. Pulmonary/Chest: Effort normal and breath sounds normal. No respiratory distress. He has no wheezes. He has no rales.  Musculoskeletal: He exhibits no edema.  See HPI for foot exam if done  Skin: Skin is warm and dry. No rash noted.  Psychiatric: He has a normal mood and affect.  Nursing note and vitals reviewed.  Results for orders placed or performed in visit on 11/03/14  Comprehensive metabolic panel  Result Value Ref Range   Sodium 138 135 - 145 mEq/L   Potassium 3.6 3.5 - 5.1 mEq/L   Chloride 103 96 - 112 mEq/L   CO2 28 19 - 32 mEq/L   Glucose, Bld 109 (H) 70 - 99 mg/dL   BUN 23 6 - 23 mg/dL   Creatinine, Ser 0.87 0.40 - 1.50 mg/dL   Total Bilirubin 1.0 0.2 - 1.2 mg/dL   Alkaline Phosphatase 221 (H) 39 - 117 U/L   AST 49 (H) 0 - 37 U/L   ALT 56 (H) 0 - 53 U/L   Total Protein 6.7 6.0 - 8.3 g/dL   Albumin 4.0 3.5 - 5.2 g/dL   Calcium 9.4 8.4 - 10.5 mg/dL   GFR 95.23 >60.00 mL/min  CBC with Differential/Platelet  Result Value Ref Range   WBC 5.0 4.0 - 10.5 K/uL   RBC 5.22 4.22 - 5.81 Mil/uL   Hemoglobin 14.6 13.0 - 17.0 g/dL   HCT 43.0 39.0 -  52.0 %   MCV 82.2 78.0 - 100.0 fl   MCHC 33.9 30.0 - 36.0 g/dL   RDW 14.6 11.5 - 15.5 %   Platelets 152.0 150.0 - 400.0 K/uL   Neutrophils Relative % 67.6 43.0 - 77.0 %   Lymphocytes Relative 17.0 12.0 - 46.0 %   Monocytes Relative 13.5 (H) 3.0 - 12.0 %   Eosinophils Relative 1.5 0.0 - 5.0 %   Basophils Relative 0.4 0.0 - 3.0 %   Neutro Abs 3.4 1.4 - 7.7 K/uL   Lymphs Abs  0.9 0.7 - 4.0 K/uL   Monocytes Absolute 0.7 0.1 - 1.0 K/uL   Eosinophils Absolute 0.1 0.0 - 0.7 K/uL   Basophils Absolute 0.0 0.0 - 0.1 K/uL  Hemoglobin A1c  Result Value Ref Range   Hgb A1c MFr Bld 5.5 4.6 - 6.5 %      Assessment & Plan:   Problem List Items Addressed This Visit    Obesity, Class III, BMI 40-49.9 (morbid obesity) ([place])    20lb weight gain noted. Discussed healthy diet and continued exercise. Difficulty staying as active as he'd like 2/2 recent MSK conditions (L knee surgery, R leg pain). Followed by ortho.      Hypothyroid    Recheck TSH/free T4 after recent change in levothyroxine formulary.      Relevant Orders   T4, free   HTN (hypertension)    Chronic, stable. Continue current regimen.      Diastolic CHF, chronic (H[facility])    Seems euvolemic without pedal edema. Continue lasix.      Diabetic gastroparesis ([place])    Consider trial off reglan with improvement in glycemic control.      Diabetes mellitus type 2 with retinopathy ([place]) - Primary    Endorses great control today. Due for eye exam - encouraged he schedule this. Off all antihyperglycemics after successful weight loss.      Abnormal drug screen    Recheck today.          Follow up plan: Return in about 4 months (around 04/28/2015), or as needed, for medicare wellness visit.

## 2014-12-29 NOTE — Assessment & Plan Note (Signed)
Seems euvolemic without pedal edema. Continue lasix.

## 2014-12-29 NOTE — Assessment & Plan Note (Signed)
Recheck today. 

## 2014-12-30 ENCOUNTER — Encounter: Payer: Self-pay | Admitting: *Deleted

## 2014-12-31 DIAGNOSIS — R531 Weakness: Secondary | ICD-10-CM | POA: Diagnosis not present

## 2014-12-31 DIAGNOSIS — M7542 Impingement syndrome of left shoulder: Secondary | ICD-10-CM | POA: Diagnosis not present

## 2014-12-31 DIAGNOSIS — M25612 Stiffness of left shoulder, not elsewhere classified: Secondary | ICD-10-CM | POA: Diagnosis not present

## 2014-12-31 DIAGNOSIS — M25512 Pain in left shoulder: Secondary | ICD-10-CM | POA: Diagnosis not present

## 2015-01-02 ENCOUNTER — Other Ambulatory Visit: Payer: Self-pay | Admitting: Family Medicine

## 2015-01-05 ENCOUNTER — Other Ambulatory Visit: Payer: Self-pay

## 2015-01-05 DIAGNOSIS — M25512 Pain in left shoulder: Secondary | ICD-10-CM | POA: Diagnosis not present

## 2015-01-05 DIAGNOSIS — M7542 Impingement syndrome of left shoulder: Secondary | ICD-10-CM | POA: Diagnosis not present

## 2015-01-05 DIAGNOSIS — R531 Weakness: Secondary | ICD-10-CM | POA: Diagnosis not present

## 2015-01-05 DIAGNOSIS — M25612 Stiffness of left shoulder, not elsewhere classified: Secondary | ICD-10-CM | POA: Diagnosis not present

## 2015-01-05 MED ORDER — HYDROCODONE-ACETAMINOPHEN 10-325 MG PO TABS: 1.0000 | ORAL_TABLET | Freq: Three times a day (TID) | ORAL | Status: DC | PRN

## 2015-01-05 NOTE — Telephone Encounter (Signed)
Printed and in [facility]' box.

## 2015-01-05 NOTE — Telephone Encounter (Signed)
Pt left v/m requesting rx hydrocodone apap. Call when ready for pick up. rx last printed # 25 on 12/08/14. Pt last seen 12/29/14.

## 2015-01-07 DIAGNOSIS — R531 Weakness: Secondary | ICD-10-CM | POA: Diagnosis not present

## 2015-01-07 DIAGNOSIS — M7542 Impingement syndrome of left shoulder: Secondary | ICD-10-CM | POA: Diagnosis not present

## 2015-01-07 DIAGNOSIS — M25612 Stiffness of left shoulder, not elsewhere classified: Secondary | ICD-10-CM | POA: Diagnosis not present

## 2015-01-07 DIAGNOSIS — M25512 Pain in left shoulder: Secondary | ICD-10-CM | POA: Diagnosis not present

## 2015-01-07 NOTE — Telephone Encounter (Signed)
Message left advising patient and Rx placed up front for pick up.

## 2015-01-12 DIAGNOSIS — R531 Weakness: Secondary | ICD-10-CM | POA: Diagnosis not present

## 2015-01-12 DIAGNOSIS — M25612 Stiffness of left shoulder, not elsewhere classified: Secondary | ICD-10-CM | POA: Diagnosis not present

## 2015-01-12 DIAGNOSIS — M7542 Impingement syndrome of left shoulder: Secondary | ICD-10-CM | POA: Diagnosis not present

## 2015-01-12 DIAGNOSIS — M25512 Pain in left shoulder: Secondary | ICD-10-CM | POA: Diagnosis not present

## 2015-01-14 DIAGNOSIS — M7542 Impingement syndrome of left shoulder: Secondary | ICD-10-CM | POA: Diagnosis not present

## 2015-01-14 DIAGNOSIS — M25512 Pain in left shoulder: Secondary | ICD-10-CM | POA: Diagnosis not present

## 2015-01-14 DIAGNOSIS — R531 Weakness: Secondary | ICD-10-CM | POA: Diagnosis not present

## 2015-01-14 DIAGNOSIS — M25612 Stiffness of left shoulder, not elsewhere classified: Secondary | ICD-10-CM | POA: Diagnosis not present

## 2015-01-19 DIAGNOSIS — M25512 Pain in left shoulder: Secondary | ICD-10-CM | POA: Diagnosis not present

## 2015-01-20 DIAGNOSIS — M25612 Stiffness of left shoulder, not elsewhere classified: Secondary | ICD-10-CM | POA: Diagnosis not present

## 2015-01-20 DIAGNOSIS — M25512 Pain in left shoulder: Secondary | ICD-10-CM | POA: Diagnosis not present

## 2015-01-20 DIAGNOSIS — R531 Weakness: Secondary | ICD-10-CM | POA: Diagnosis not present

## 2015-01-20 DIAGNOSIS — M7542 Impingement syndrome of left shoulder: Secondary | ICD-10-CM | POA: Diagnosis not present

## 2015-01-21 DIAGNOSIS — M7061 Trochanteric bursitis, right hip: Secondary | ICD-10-CM | POA: Diagnosis not present

## 2015-01-23 ENCOUNTER — Other Ambulatory Visit: Payer: Self-pay | Admitting: Family Medicine

## 2015-01-23 DIAGNOSIS — R531 Weakness: Secondary | ICD-10-CM | POA: Diagnosis not present

## 2015-01-23 DIAGNOSIS — M25512 Pain in left shoulder: Secondary | ICD-10-CM | POA: Diagnosis not present

## 2015-01-23 DIAGNOSIS — M25612 Stiffness of left shoulder, not elsewhere classified: Secondary | ICD-10-CM | POA: Diagnosis not present

## 2015-01-26 DIAGNOSIS — M25612 Stiffness of left shoulder, not elsewhere classified: Secondary | ICD-10-CM | POA: Diagnosis not present

## 2015-01-26 DIAGNOSIS — R531 Weakness: Secondary | ICD-10-CM | POA: Diagnosis not present

## 2015-01-26 DIAGNOSIS — M7542 Impingement syndrome of left shoulder: Secondary | ICD-10-CM | POA: Diagnosis not present

## 2015-01-26 DIAGNOSIS — M25512 Pain in left shoulder: Secondary | ICD-10-CM | POA: Diagnosis not present

## 2015-01-29 ENCOUNTER — Ambulatory Visit (INDEPENDENT_AMBULATORY_CARE_PROVIDER_SITE_OTHER): Payer: Medicare Other | Admitting: Family Medicine

## 2015-01-29 ENCOUNTER — Encounter: Payer: Self-pay | Admitting: Family Medicine

## 2015-01-29 VITALS — BP 140/62 | HR 61 | Temp 98.1°F | Ht 69.0 in | Wt 294.2 lb

## 2015-01-29 DIAGNOSIS — N481 Balanitis: Secondary | ICD-10-CM | POA: Diagnosis not present

## 2015-01-29 DIAGNOSIS — R531 Weakness: Secondary | ICD-10-CM | POA: Diagnosis not present

## 2015-01-29 DIAGNOSIS — R300 Dysuria: Secondary | ICD-10-CM | POA: Diagnosis not present

## 2015-01-29 DIAGNOSIS — N476 Balanoposthitis: Secondary | ICD-10-CM

## 2015-01-29 DIAGNOSIS — N471 Phimosis: Secondary | ICD-10-CM | POA: Diagnosis not present

## 2015-01-29 DIAGNOSIS — M25612 Stiffness of left shoulder, not elsewhere classified: Secondary | ICD-10-CM | POA: Diagnosis not present

## 2015-01-29 DIAGNOSIS — I2510 Atherosclerotic heart disease of native coronary artery without angina pectoris: Secondary | ICD-10-CM

## 2015-01-29 DIAGNOSIS — M7542 Impingement syndrome of left shoulder: Secondary | ICD-10-CM | POA: Diagnosis not present

## 2015-01-29 DIAGNOSIS — M25512 Pain in left shoulder: Secondary | ICD-10-CM | POA: Diagnosis not present

## 2015-01-29 HISTORY — DX: Balanoposthitis: N47.6

## 2015-01-29 LAB — POCT URINALYSIS DIP (MANUAL ENTRY)
BILIRUBIN UA: NEGATIVE
Glucose, UA: NEGATIVE
Ketones, POC UA: NEGATIVE
LEUKOCYTES UA: NEGATIVE
NITRITE UA: NEGATIVE
PH UA: 6
Protein Ur, POC: NEGATIVE
RBC UA: NEGATIVE
Spec Grav, UA: 1.015
Urobilinogen, UA: 0.2

## 2015-01-29 MED ORDER — METRONIDAZOLE 500 MG PO TABS: 500.0000 mg | ORAL_TABLET | Freq: Two times a day (BID) | ORAL | Status: DC

## 2015-01-29 MED ORDER — FLUCONAZOLE 150 MG PO TABS: 150.0000 mg | ORAL_TABLET | Freq: Once | ORAL | Status: DC

## 2015-01-29 NOTE — Progress Notes (Signed)
Pre visit review using our clinic review tool, if applicable. No additional management support is needed unless otherwise documented below in the visit note. 

## 2015-01-29 NOTE — Progress Notes (Signed)
   Subjective:    Patient ID: Chad [name], male    DOB: [birth year], 59 y.o.   MRN: [id number]  HPI 59 year old male pt of Dr. Darnell Level presents with dysuria x 1 month. Started as slight burning  After urinating, but has worsened. Increased urinary  Frequency but this has been going on several years.  No urgency.  He is uncircumscised.  He noted with yellow white discharge under skin.  Appear red.  Burns when urine hits skin of penis.   Nml urine out put, low abdominal pain, low back pain, bilaterally.  No current sexual activity due to other health problems. No new partners.  Married.  Social History /Family History/Past Medical History reviewed and updated if needed. No past UTI.  UA today is negative.  Review of Systems  Constitutional: Negative for fever and fatigue.  HENT: Negative for ear pain.   Eyes: Negative for pain.  Respiratory: Negative for cough.   Cardiovascular: Negative for chest pain.       Objective:   Physical Exam  Constitutional: Vital signs are normal. He appears well-developed and well-nourished.  HENT:  Head: Normocephalic.  Right Ear: Hearing normal.  Left Ear: Hearing normal.  Nose: Nose normal.  Mouth/Throat: Oropharynx is clear and moist and mucous membranes are normal.  Neck: Trachea normal. Carotid bruit is not present. No thyroid mass and no thyromegaly present.  Cardiovascular: Normal rate, regular rhythm and normal pulses.  Exam reveals no gallop, no distant heart sounds and no friction rub.   No murmur heard. No peripheral edema  Pulmonary/Chest: Effort normal and breath sounds normal. No respiratory distress.  Abdominal: Normal appearance and bowel sounds are normal. There is no hepatosplenomegaly. There is no tenderness. There is no CVA tenderness. Hernia confirmed negative in the right inguinal area and confirmed negative in the left inguinal area.  Genitourinary: Testes normal. Right testis shows no mass and no tenderness. Left testis  shows no tenderness. Penile erythema and penile tenderness present. Discharge found.  Pain with retraction of foreskin, unable to retract completely given tightness of skin, redness and pain at tip of penis.  Musculoskeletal:  Low back ttp bilaterally  Lymphadenopathy:       Right: No inguinal adenopathy present.       Left: No inguinal adenopathy present.  Skin: Skin is warm, dry and intact. No rash noted.  Psychiatric: He has a normal mood and affect. His speech is normal and behavior is normal. Thought content normal.   Large pannus and when no pressure applied penis is retracted into lower abdominal tissue.        Assessment & Plan:  Dysuria: negative UA, doubt UTI.   Blan[place]is: Given discharge will treat for bacterial as well as candida etiology with diflucana nd flagyl.  He also has pain with retraction and incomplete retraction of foreskin (phimosis) likely form schronic inflammation. If not improving refer to uro for further recommendations.

## 2015-01-29 NOTE — Patient Instructions (Addendum)
Treat with antibiotics and yeast medication as directed.  If remaining painful or unable to retract foreskin completely consider referral to urologist.  Clean area daily with warm  water.

## 2015-02-02 DIAGNOSIS — M7542 Impingement syndrome of left shoulder: Secondary | ICD-10-CM | POA: Diagnosis not present

## 2015-02-02 DIAGNOSIS — M25512 Pain in left shoulder: Secondary | ICD-10-CM | POA: Diagnosis not present

## 2015-02-02 DIAGNOSIS — R531 Weakness: Secondary | ICD-10-CM | POA: Diagnosis not present

## 2015-02-02 DIAGNOSIS — M25612 Stiffness of left shoulder, not elsewhere classified: Secondary | ICD-10-CM | POA: Diagnosis not present

## 2015-02-04 DIAGNOSIS — R531 Weakness: Secondary | ICD-10-CM | POA: Diagnosis not present

## 2015-02-04 DIAGNOSIS — M25612 Stiffness of left shoulder, not elsewhere classified: Secondary | ICD-10-CM | POA: Diagnosis not present

## 2015-02-04 DIAGNOSIS — M7542 Impingement syndrome of left shoulder: Secondary | ICD-10-CM | POA: Diagnosis not present

## 2015-02-04 DIAGNOSIS — M25512 Pain in left shoulder: Secondary | ICD-10-CM | POA: Diagnosis not present

## 2015-02-05 ENCOUNTER — Other Ambulatory Visit: Payer: Self-pay

## 2015-02-05 NOTE — Telephone Encounter (Signed)
Chad [name] left v/m requesting rx hydrocodone apap. Call when ready for pick up. rx last printed # 60 on 01/05/15; pt last f/u appt on 12/29/14.

## 2015-02-06 MED ORDER — HYDROCODONE-ACETAMINOPHEN 10-325 MG PO TABS: 1.0000 | ORAL_TABLET | Freq: Three times a day (TID) | ORAL | Status: DC | PRN

## 2015-02-06 NOTE — Telephone Encounter (Signed)
Printed and in Kim's box.

## 2015-02-09 DIAGNOSIS — M25512 Pain in left shoulder: Secondary | ICD-10-CM | POA: Diagnosis not present

## 2015-02-09 DIAGNOSIS — R531 Weakness: Secondary | ICD-10-CM | POA: Diagnosis not present

## 2015-02-09 DIAGNOSIS — M25612 Stiffness of left shoulder, not elsewhere classified: Secondary | ICD-10-CM | POA: Diagnosis not present

## 2015-02-09 DIAGNOSIS — M7542 Impingement syndrome of left shoulder: Secondary | ICD-10-CM | POA: Diagnosis not present

## 2015-02-09 NOTE — Telephone Encounter (Signed)
Rx given to patient at his wife's appt today.

## 2015-02-11 DIAGNOSIS — M25612 Stiffness of left shoulder, not elsewhere classified: Secondary | ICD-10-CM | POA: Diagnosis not present

## 2015-02-11 DIAGNOSIS — R531 Weakness: Secondary | ICD-10-CM | POA: Diagnosis not present

## 2015-02-11 DIAGNOSIS — M7542 Impingement syndrome of left shoulder: Secondary | ICD-10-CM | POA: Diagnosis not present

## 2015-02-11 DIAGNOSIS — M25512 Pain in left shoulder: Secondary | ICD-10-CM | POA: Diagnosis not present

## 2015-02-17 ENCOUNTER — Other Ambulatory Visit: Payer: Self-pay | Admitting: Family Medicine

## 2015-02-17 DIAGNOSIS — M25512 Pain in left shoulder: Secondary | ICD-10-CM | POA: Diagnosis not present

## 2015-02-18 DIAGNOSIS — M25512 Pain in left shoulder: Secondary | ICD-10-CM | POA: Diagnosis not present

## 2015-02-18 DIAGNOSIS — R531 Weakness: Secondary | ICD-10-CM | POA: Diagnosis not present

## 2015-02-18 DIAGNOSIS — M7542 Impingement syndrome of left shoulder: Secondary | ICD-10-CM | POA: Diagnosis not present

## 2015-02-18 DIAGNOSIS — M25612 Stiffness of left shoulder, not elsewhere classified: Secondary | ICD-10-CM | POA: Diagnosis not present

## 2015-02-24 DIAGNOSIS — R531 Weakness: Secondary | ICD-10-CM | POA: Diagnosis not present

## 2015-02-24 DIAGNOSIS — M25512 Pain in left shoulder: Secondary | ICD-10-CM | POA: Diagnosis not present

## 2015-02-24 DIAGNOSIS — M7542 Impingement syndrome of left shoulder: Secondary | ICD-10-CM | POA: Diagnosis not present

## 2015-02-24 DIAGNOSIS — M25612 Stiffness of left shoulder, not elsewhere classified: Secondary | ICD-10-CM | POA: Diagnosis not present

## 2015-02-27 DIAGNOSIS — M7542 Impingement syndrome of left shoulder: Secondary | ICD-10-CM | POA: Diagnosis not present

## 2015-02-27 DIAGNOSIS — R531 Weakness: Secondary | ICD-10-CM | POA: Diagnosis not present

## 2015-02-27 DIAGNOSIS — M25612 Stiffness of left shoulder, not elsewhere classified: Secondary | ICD-10-CM | POA: Diagnosis not present

## 2015-02-27 DIAGNOSIS — M25512 Pain in left shoulder: Secondary | ICD-10-CM | POA: Diagnosis not present

## 2015-03-04 DIAGNOSIS — M25512 Pain in left shoulder: Secondary | ICD-10-CM | POA: Diagnosis not present

## 2015-03-04 DIAGNOSIS — M25612 Stiffness of left shoulder, not elsewhere classified: Secondary | ICD-10-CM | POA: Diagnosis not present

## 2015-03-04 DIAGNOSIS — R531 Weakness: Secondary | ICD-10-CM | POA: Diagnosis not present

## 2015-03-04 DIAGNOSIS — M7542 Impingement syndrome of left shoulder: Secondary | ICD-10-CM | POA: Diagnosis not present

## 2015-03-09 ENCOUNTER — Other Ambulatory Visit: Payer: Self-pay

## 2015-03-09 MED ORDER — HYDROCODONE-ACETAMINOPHEN 10-325 MG PO TABS: 1.0000 | ORAL_TABLET | Freq: Three times a day (TID) | ORAL | Status: DC | PRN

## 2015-03-09 NOTE — Telephone Encounter (Signed)
Printed and in [place] box.

## 2015-03-09 NOTE — Telephone Encounter (Signed)
Pt;s wife left v/m requesting rx hydrocodone apap. Call when ready for pick up. rx last printed # 60 on 02/06/15 and pt last seen 12/29/14.

## 2015-03-10 NOTE — Telephone Encounter (Signed)
Rx given to patient at wife's appt with Dr. Silvio Pate this AM.

## 2015-03-12 DIAGNOSIS — M25561 Pain in right knee: Secondary | ICD-10-CM | POA: Diagnosis not present

## 2015-03-22 ENCOUNTER — Encounter (HOSPITAL_COMMUNITY): Payer: Self-pay | Admitting: Emergency Medicine

## 2015-03-22 ENCOUNTER — Observation Stay (HOSPITAL_COMMUNITY)
Admission: EM | Admit: 2015-03-22 | Discharge: 2015-03-24 | Disposition: A | Discharge status: Home / Self Care | Payer: Medicare Other | Attending: Internal Medicine | Admitting: Internal Medicine

## 2015-03-22 ENCOUNTER — Emergency Department (HOSPITAL_COMMUNITY): Payer: Medicare Other

## 2015-03-22 DIAGNOSIS — I110 Hypertensive heart disease with heart failure: Secondary | ICD-10-CM | POA: Insufficient documentation

## 2015-03-22 DIAGNOSIS — Z6841 Body Mass Index (BMI) 40.0 and over, adult: Secondary | ICD-10-CM | POA: Insufficient documentation

## 2015-03-22 DIAGNOSIS — Z9884 Bariatric surgery status: Secondary | ICD-10-CM | POA: Diagnosis not present

## 2015-03-22 DIAGNOSIS — I452 Bifascicular block: Secondary | ICD-10-CM | POA: Diagnosis not present

## 2015-03-22 DIAGNOSIS — E113299 Type 2 diabetes mellitus with mild nonproliferative diabetic retinopathy without macular edema, unspecified eye: Secondary | ICD-10-CM

## 2015-03-22 DIAGNOSIS — I440 Atrioventricular block, first degree: Secondary | ICD-10-CM

## 2015-03-22 DIAGNOSIS — Z7982 Long term (current) use of aspirin: Secondary | ICD-10-CM | POA: Insufficient documentation

## 2015-03-22 DIAGNOSIS — E1165 Type 2 diabetes mellitus with hyperglycemia: Secondary | ICD-10-CM

## 2015-03-22 DIAGNOSIS — G4733 Obstructive sleep apnea (adult) (pediatric): Secondary | ICD-10-CM | POA: Diagnosis not present

## 2015-03-22 DIAGNOSIS — R079 Chest pain, unspecified: Principal | ICD-10-CM | POA: Diagnosis present

## 2015-03-22 DIAGNOSIS — K219 Gastro-esophageal reflux disease without esophagitis: Secondary | ICD-10-CM | POA: Diagnosis not present

## 2015-03-22 DIAGNOSIS — I2510 Atherosclerotic heart disease of native coronary artery without angina pectoris: Secondary | ICD-10-CM | POA: Diagnosis not present

## 2015-03-22 DIAGNOSIS — R072 Precordial pain: Secondary | ICD-10-CM | POA: Diagnosis not present

## 2015-03-22 DIAGNOSIS — I5032 Chronic diastolic (congestive) heart failure: Secondary | ICD-10-CM

## 2015-03-22 DIAGNOSIS — R0602 Shortness of breath: Secondary | ICD-10-CM | POA: Diagnosis not present

## 2015-03-22 DIAGNOSIS — R071 Chest pain on breathing: Secondary | ICD-10-CM

## 2015-03-22 DIAGNOSIS — E785 Hyperlipidemia, unspecified: Secondary | ICD-10-CM | POA: Insufficient documentation

## 2015-03-22 DIAGNOSIS — E11319 Type 2 diabetes mellitus with unspecified diabetic retinopathy without macular edema: Secondary | ICD-10-CM | POA: Diagnosis not present

## 2015-03-22 DIAGNOSIS — E039 Hypothyroidism, unspecified: Secondary | ICD-10-CM | POA: Diagnosis present

## 2015-03-22 DIAGNOSIS — E1169 Type 2 diabetes mellitus with other specified complication: Secondary | ICD-10-CM | POA: Diagnosis present

## 2015-03-22 DIAGNOSIS — I10 Essential (primary) hypertension: Secondary | ICD-10-CM | POA: Diagnosis present

## 2015-03-22 LAB — TROPONIN I

## 2015-03-22 LAB — GLUCOSE, CAPILLARY
GLUCOSE-CAPILLARY: 138 mg/dL — AB (ref 65–99)
Glucose-Capillary: 103 mg/dL — ABNORMAL HIGH (ref 65–99)

## 2015-03-22 LAB — CBC WITH DIFFERENTIAL/PLATELET
Basophils Absolute: 0 10*3/uL (ref 0.0–0.1)
Basophils Relative: 0 %
EOS ABS: 0.1 10*3/uL (ref 0.0–0.7)
EOS PCT: 1 %
HCT: 42.1 % (ref 39.0–52.0)
Hemoglobin: 14.3 g/dL (ref 13.0–17.0)
LYMPHS ABS: 0.8 10*3/uL (ref 0.7–4.0)
Lymphocytes Relative: 13 %
MCH: 28.4 pg (ref 26.0–34.0)
MCHC: 34 g/dL (ref 30.0–36.0)
MCV: 83.7 fL (ref 78.0–100.0)
MONOS PCT: 9 %
Monocytes Absolute: 0.6 10*3/uL (ref 0.1–1.0)
Neutro Abs: 4.7 10*3/uL (ref 1.7–7.7)
Neutrophils Relative %: 77 %
Platelets: 137 10*3/uL — ABNORMAL LOW (ref 150–400)
RBC: 5.03 MIL/uL (ref 4.22–5.81)
RDW: 14.1 % (ref 11.5–15.5)
WBC: 6.2 10*3/uL (ref 4.0–10.5)

## 2015-03-22 LAB — BASIC METABOLIC PANEL
Anion gap: 11 (ref 5–15)
BUN: 17 mg/dL (ref 6–20)
CO2: 26 mmol/L (ref 22–32)
Calcium: 9 mg/dL (ref 8.9–10.3)
Chloride: 101 mmol/L (ref 101–111)
Creatinine, Ser: 0.93 mg/dL (ref 0.61–1.24)
GFR calc Af Amer: >60 mL/min (ref >60)
GFR calc non Af Amer: >60 mL/min (ref >60)
Glucose, Bld: 135 mg/dL — ABNORMAL HIGH (ref 65–99)
Potassium: 4 mmol/L (ref 3.5–5.1)
Sodium: 138 mmol/L (ref 135–145)

## 2015-03-22 LAB — I-STAT TROPONIN, ED: Troponin i, poc: 0 ng/mL (ref 0.00–0.08)

## 2015-03-22 LAB — HEPARIN LEVEL (UNFRACTIONATED): HEPARIN UNFRACTIONATED: 0.33 [IU]/mL (ref 0.30–0.70)

## 2015-03-22 LAB — APTT: aPTT: 120 seconds — ABNORMAL HIGH (ref 24–37)

## 2015-03-22 LAB — PROTIME-INR
INR: 1.26 (ref 0.00–1.49)
PROTHROMBIN TIME: 16 s — AB (ref 11.6–15.2)

## 2015-03-22 LAB — BRAIN NATRIURETIC PEPTIDE: B Natriuretic Peptide: 18.8 pg/mL (ref 0.0–100.0)

## 2015-03-22 LAB — TSH: TSH: 1.888 u[IU]/mL (ref 0.350–4.500)

## 2015-03-22 LAB — MRSA PCR SCREENING: MRSA by PCR: NEGATIVE

## 2015-03-22 LAB — D-DIMER, QUANTITATIVE (NOT AT ARMC): D DIMER QUANT: 0.34 ug{FEU}/mL (ref 0.00–0.50)

## 2015-03-22 MED ORDER — ONDANSETRON HCL 4 MG/2ML IJ SOLN: 4.0000 mg | Freq: Four times a day (QID) | INTRAMUSCULAR | Status: DC | PRN

## 2015-03-22 MED ORDER — ALBUTEROL SULFATE (2.5 MG/3ML) 0.083% IN NEBU: 2.5000 mg | INHALATION_SOLUTION | Freq: Four times a day (QID) | RESPIRATORY_TRACT | Status: DC | PRN

## 2015-03-22 MED ORDER — METOPROLOL TARTRATE 25 MG PO TABS
25.0000 mg | ORAL_TABLET | Freq: Two times a day (BID) | ORAL | Status: DC
  Administered 2015-03-22 – 2015-03-24 (×4): 25 mg via ORAL
  Filled 2015-03-22 (×4): qty 1

## 2015-03-22 MED ORDER — NITROGLYCERIN IN D5W 200-5 MCG/ML-% IV SOLN
3.0000 ug/min | INTRAVENOUS | Status: DC
  Administered 2015-03-22: 30 ug/min via INTRAVENOUS

## 2015-03-22 MED ORDER — PANTOPRAZOLE SODIUM 40 MG PO TBEC
40.0000 mg | DELAYED_RELEASE_TABLET | Freq: Two times a day (BID) | ORAL | Status: DC
  Administered 2015-03-22 – 2015-03-24 (×5): 40 mg via ORAL
  Filled 2015-03-22 (×5): qty 1

## 2015-03-22 MED ORDER — METOCLOPRAMIDE HCL 10 MG PO TABS
10.0000 mg | ORAL_TABLET | Freq: Four times a day (QID) | ORAL | Status: DC | PRN
  Administered 2015-03-23: 10 mg via ORAL
  Filled 2015-03-22: qty 1

## 2015-03-22 MED ORDER — HEPARIN BOLUS VIA INFUSION
4000.0000 [IU] | Freq: Once | INTRAVENOUS | Status: AC
  Administered 2015-03-22: 4000 [IU] via INTRAVENOUS
  Filled 2015-03-22: qty 4000

## 2015-03-22 MED ORDER — ACETAMINOPHEN 325 MG PO TABS: 650.0000 mg | ORAL_TABLET | ORAL | Status: DC | PRN

## 2015-03-22 MED ORDER — LEVOTHYROXINE SODIUM 100 MCG PO TABS
100.0000 ug | ORAL_TABLET | Freq: Every day | ORAL | Status: DC
  Administered 2015-03-23 – 2015-03-24 (×2): 100 ug via ORAL
  Filled 2015-03-22 (×2): qty 1

## 2015-03-22 MED ORDER — OMEGA-3-ACID ETHYL ESTERS 1 G PO CAPS
2.0000 | ORAL_CAPSULE | Freq: Two times a day (BID) | ORAL | Status: DC
  Administered 2015-03-22 – 2015-03-24 (×4): 2 g via ORAL
  Filled 2015-03-22 (×4): qty 2

## 2015-03-22 MED ORDER — FLAX SEED OIL 1000 MG PO CAPS: 1.0000 | ORAL_CAPSULE | Freq: Two times a day (BID) | ORAL | Status: DC

## 2015-03-22 MED ORDER — POLYETHYLENE GLYCOL 3350 17 G PO PACK: 17.0000 g | PACK | Freq: Every day | ORAL | Status: DC

## 2015-03-22 MED ORDER — NITROGLYCERIN IN D5W 200-5 MCG/ML-% IV SOLN
5.0000 ug/min | Freq: Once | INTRAVENOUS | Status: AC
  Administered 2015-03-22: 5 ug/min via INTRAVENOUS
  Filled 2015-03-22: qty 250

## 2015-03-22 MED ORDER — ASPIRIN EC 81 MG PO TBEC
81.0000 mg | DELAYED_RELEASE_TABLET | Freq: Every day | ORAL | Status: DC
  Administered 2015-03-23: 81 mg via ORAL
  Filled 2015-03-22: qty 1

## 2015-03-22 MED ORDER — RED YEAST RICE 600 MG PO CAPS: 1.0000 | ORAL_CAPSULE | Freq: Two times a day (BID) | ORAL | Status: DC

## 2015-03-22 MED ORDER — HEPARIN (PORCINE) IN NACL 100-0.45 UNIT/ML-% IJ SOLN
1600.0000 [IU]/h | INTRAMUSCULAR | Status: DC
  Administered 2015-03-22: 1300 [IU]/h via INTRAVENOUS
  Filled 2015-03-22 (×2): qty 250

## 2015-03-22 MED ORDER — TRAMADOL HCL 50 MG PO TABS
50.0000 mg | ORAL_TABLET | Freq: Four times a day (QID) | ORAL | Status: DC | PRN
  Administered 2015-03-22 – 2015-03-23 (×3): 50 mg via ORAL
  Filled 2015-03-22 (×3): qty 1

## 2015-03-22 MED ORDER — INSULIN ASPART 100 UNIT/ML ~~LOC~~ SOLN
0.0000 [IU] | Freq: Three times a day (TID) | SUBCUTANEOUS | Status: DC
  Administered 2015-03-23: 1 [IU] via SUBCUTANEOUS

## 2015-03-22 MED ORDER — POLYETHYLENE GLYCOL 3350 17 GM/SCOOP PO POWD
17.0000 g | Freq: Every day | ORAL | Status: DC
  Filled 2015-03-22: qty 255
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
## 2015-03-22 NOTE — ED Notes (Signed)
Left arm pain x 2 days; today sudden onset of left chest pressure that radiated to left jaw and left arm, rated 8/10. Nauseated. Diaphoretic. Given 4 nitro and 362m of ASA in route. Rates pressure 6/10 at this time.
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
## 2015-03-22 NOTE — Progress Notes (Signed)
ANTICOAGULATION CONSULT NOTE - Initial Consult  Pharmacy Consult for heparin Indication: chest pain/ACS  Allergies  Allergen Reactions  . Codeine Nausea Only  . Nsaids Other (See Comments)    Acid Reflux and palpitations  . Statins Other (See Comments)    Bad leg cramps  . Sulfa Drugs Cross Reactors Nausea Only    Patient Measurements: Height: 5' 9"  (175.3 cm) Weight: 299 lb 6.2 oz (135.8 kg) IBW/kg (Calculated) : 70.7 Heparin Dosing Weight: 102 kg  Vital Signs: Temp: 97.9 F (36.6 C) (01/29 1300) Temp Source: Oral (01/29 1300) BP: 138/65 mmHg (01/29 1300) Pulse Rate: 58 (01/29 1300)  Labs:  Recent Labs  03/22/15 1017  HGB 14.3  HCT 42.1  PLT 137*  CREATININE 0.93    Estimated Creatinine Clearance: 115.5 mL/min (by C-G formula based on Cr of 0.93).   Medical History: Past Medical History  Diagnosis Date  . History of diabetes mellitus 1990s    with mild background retinopathy, resolved with weight loss  . HTN (hypertension)   . HLD (hyperlipidemia)     statin caused leg cramps  . GERD (gastroesophageal reflux disease)     severe, h/o gastritis and GI bleed, per pt normal EGD at [facility] 2008  . Seasonal allergies   . Hyperplastic colon polyp 2008  . Hypothyroid   . Sensorineural hearing loss, bilateral     hearing aides  . Gastric bypass status for obesity 1985  . Morbid obesity ([place])   . Bulging lumbar disc   . Bone spur     L4 L5  . Narrowing of lumbar spine   . Right ear pain     s/p eval by ENT - thought TMJ referred pain and sent to oral surg for dental splint  . Diastolic CHF, chronic ([place]) 04/02/2012  . Shortness of breath     "comes on at anytime lately" (01/25/2013)  . OSA (obstructive sleep apnea)     unable to use CPAP as of last try 2/2 h/o tracheostomy?  Marland Kitchen Arthritis     "both hips and knees; got shots in each hip in August" (01/25/2013)  . Trifascicular block  RBBB/LPFB/1AVB   . PVC (premature ventricular contraction)     RBBB Infer axis   . Tinnitus     due to sensorineural hearing loss R>L with ETD  . Abnormal drug screen 09/2013, 02/2014    innapropriately negative for hydrocodone, inappropriately negative for hydrocodone and tramadol  . Diabetes mellitus without complication ([place])   . Acute diverticulitis 08/15/2014    Assessment: 79 YOM with hx of CAD, diastolic hear failure, presented with chest pain to start IV heparin. Baseline hgb 14.3, plt 137K, not on anticoagulation PTA   Goal of Therapy:  Heparin level 0.3-0.7 units/ml Monitor platelets by anticoagulation protocol: Yes   Plan:  Heparin bolus 4000 unitsx 1 Heparin infusion 1300 units/hr 6 hr heparin level at 2000 Daily heparin level and CBC  Maryanna Shape, PharmD, BCPS  Clinical Pharmacist  Pager: [phone]   03/22/2015,1:36 PM
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
## 2015-03-22 NOTE — Progress Notes (Signed)
PHARMACIST - PHYSICIAN ORDER COMMUNICATION  CONCERNING: P&T Medication Policy on Herbal Medications  DESCRIPTION:  This patient's order for:  Red Yeast Rice/Flaxseed oil  has been noted.  This product(s) is classified as an "herbal" or natural product. Due to a lack of definitive safety studies or FDA approval, nonstandard manufacturing practices, plus the potential risk of unknown drug-drug interactions while on inpatient medications, the Pharmacy and Therapeutics Committee does not permit the use of "herbal" or natural products of this type within [facility].   ACTION TAKEN: The pharmacy department is unable to verify this order at this time and your patient has been informed of this safety policy. Please reevaluate patient's clinical condition at discharge and address if the herbal or natural product(s) should be resumed at that time.   Reatha Harps, Pharm.D., BCPS Clinical Pharmacist

## 2015-03-22 NOTE — Progress Notes (Signed)
[place] for heparin Indication: chest pain/ACS  Allergies  Allergen Reactions  . Codeine Nausea Only  . Nsaids Other (See Comments)    Acid Reflux and palpitations  . Statins Other (See Comments)    Bad leg cramps  . Sulfa Drugs Cross Reactors Nausea Only    Patient Measurements: Height: 5' 9"  (175.3 cm) Weight: 299 lb 6.2 oz (135.8 kg) IBW/kg (Calculated) : 70.7 Heparin Dosing Weight: 102 kg  Vital Signs: Temp: 98.6 F (37 C) (01/29 1730) Temp Source: Oral (01/29 1730) BP: 124/59 mmHg (01/29 1730) Pulse Rate: 59 (01/29 1730)  Labs:  Recent Labs  03/22/15 1017 03/22/15 1500 03/22/15 1947  HGB 14.3  --   --   HCT 42.1  --   --   PLT 137*  --   --   APTT  --  120*  --   LABPROT  --  16.0*  --   INR  --  1.26  --   HEPARINUNFRC  --   --  0.33  CREATININE 0.93  --   --   TROPONINI  --  <0.03  --     Estimated Creatinine Clearance: 115.5 mL/min (by C-G formula based on Cr of 0.93).   Medical History: Past Medical History  Diagnosis Date  . History of diabetes mellitus 1990s    with mild background retinopathy, resolved with weight loss  . HTN (hypertension)   . HLD (hyperlipidemia)     statin caused leg cramps  . GERD (gastroesophageal reflux disease)     severe, h/o gastritis and GI bleed, per pt normal EGD at [facility] 2008  . Seasonal allergies   . Hyperplastic colon polyp 2008  . Hypothyroid   . Sensorineural hearing loss, bilateral     hearing aides  . Gastric bypass status for obesity 1985  . Morbid obesity ([place])   . Bulging lumbar disc   . Bone spur     L4 L5  . Narrowing of lumbar spine   . Right ear pain     s/p eval by ENT - thought TMJ referred pain and sent to oral surg for dental splint  . Diastolic CHF, chronic ([place]) 04/02/2012  . Shortness of breath     "comes on at anytime lately" (01/25/2013)  . OSA (obstructive sleep apnea)     unable to use CPAP as of last try 2/2 h/o tracheostomy?  Marland Kitchen Arthritis      "both hips and knees; got shots in each hip in August" (01/25/2013)  . Trifascicular block  RBBB/LPFB/1AVB   . PVC (premature ventricular contraction)     RBBB Infer axis  . Tinnitus     due to sensorineural hearing loss R>L with ETD  . Abnormal drug screen 09/2013, 02/2014    innapropriately negative for hydrocodone, inappropriately negative for hydrocodone and tramadol  . Diabetes mellitus without complication ([place])   . Acute diverticulitis 08/15/2014    Assessment: 35 YOM with hx of CAD, diastolic hear failure, presented with chest pain to start IV heparin. Baseline hgb 14.3, plt 137K, not on anticoagulation PTA   Goal of Therapy:  Heparin level 0.3-0.7 units/ml Monitor platelets by anticoagulation protocol: Yes   Plan:  Heparin bolus 4000 unitsx 1 Heparin infusion 1300 units/hr 6 hr heparin level at 2000 Daily heparin level and CBC  Maryanna Shape, PharmD, BCPS  Clinical Pharmacist  Pager: [phone]   03/22/2015,8:26 PM   Addnedum  Heparin level is therapeutic Continue current rate.  F/u with AM labs    Hughes Better, PharmD, BCPS Clinical Pharmacist 03/22/2015 8:26 PM

## 2015-03-23 ENCOUNTER — Observation Stay (HOSPITAL_BASED_OUTPATIENT_CLINIC_OR_DEPARTMENT_OTHER): Payer: Medicare Other

## 2015-03-23 DIAGNOSIS — R072 Precordial pain: Secondary | ICD-10-CM | POA: Diagnosis not present

## 2015-03-23 DIAGNOSIS — R079 Chest pain, unspecified: Secondary | ICD-10-CM | POA: Diagnosis not present

## 2015-03-23 DIAGNOSIS — I110 Hypertensive heart disease with heart failure: Secondary | ICD-10-CM | POA: Diagnosis not present

## 2015-03-23 DIAGNOSIS — I5032 Chronic diastolic (congestive) heart failure: Secondary | ICD-10-CM | POA: Diagnosis not present

## 2015-03-23 DIAGNOSIS — E11319 Type 2 diabetes mellitus with unspecified diabetic retinopathy without macular edema: Secondary | ICD-10-CM | POA: Diagnosis not present

## 2015-03-23 DIAGNOSIS — E113553 Type 2 diabetes mellitus with stable proliferative diabetic retinopathy, bilateral: Secondary | ICD-10-CM | POA: Diagnosis not present

## 2015-03-23 DIAGNOSIS — E039 Hypothyroidism, unspecified: Secondary | ICD-10-CM

## 2015-03-23 DIAGNOSIS — E785 Hyperlipidemia, unspecified: Secondary | ICD-10-CM

## 2015-03-23 DIAGNOSIS — I2510 Atherosclerotic heart disease of native coronary artery without angina pectoris: Secondary | ICD-10-CM | POA: Diagnosis not present

## 2015-03-23 DIAGNOSIS — I10 Essential (primary) hypertension: Secondary | ICD-10-CM

## 2015-03-23 LAB — LIPID PANEL
Cholesterol: 141 mg/dL (ref 0–200)
HDL: 46 mg/dL (ref >40)
LDL CALC: 86 mg/dL (ref 0–99)
Total CHOL/HDL Ratio: 3.1 RATIO
Triglycerides: 46 mg/dL (ref <150)
VLDL: 9 mg/dL (ref 0–40)

## 2015-03-23 LAB — NM MYOCAR MULTI W/SPECT W/WALL MOTION / EF
CHL CUP NUCLEAR SDS: 3
CHL CUP STRESS STAGE 1 HR: 64 {beats}/min
CHL CUP STRESS STAGE 1 SBP: 164 mmHg
CHL CUP STRESS STAGE 2 SPEED: 0 mph
CHL CUP STRESS STAGE 3 DBP: 61 mmHg
CHL CUP STRESS STAGE 3 GRADE: 0 %
CHL CUP STRESS STAGE 3 HR: 79 {beats}/min
CHL CUP STRESS STAGE 3 SBP: 152 mmHg
CHL CUP STRESS STAGE 3 SPEED: 0 mph
CHL CUP STRESS STAGE 4 DBP: 48 mmHg
CHL CUP STRESS STAGE 4 HR: 73 {beats}/min
CHL CUP STRESS STAGE 4 SBP: 160 mmHg
CSEPED: 0 min
CSEPEDS: 0 s
LHR: 0.33
LV sys vol: 67 mL
LVDIAVOL: 153 mL
MPHR: 160 {beats}/min
Percent HR: 51 %
Rest HR: 65 {beats}/min
SRS: 5
SSS: 8
Stage 1 DBP: 75 mmHg
Stage 1 Grade: 0 %
Stage 1 Speed: 0 mph
Stage 2 Grade: 0 %
Stage 2 HR: 64 {beats}/min
Stage 4 Grade: 0 %
Stage 4 Speed: 0 mph
TID: 1.24

## 2015-03-23 LAB — BASIC METABOLIC PANEL
ANION GAP: 8 (ref 5–15)
BUN: 15 mg/dL (ref 6–20)
CHLORIDE: 104 mmol/L (ref 101–111)
CO2: 28 mmol/L (ref 22–32)
Calcium: 8.9 mg/dL (ref 8.9–10.3)
Creatinine, Ser: 0.98 mg/dL (ref 0.61–1.24)
GFR calc Af Amer: >60 mL/min (ref >60)
GFR calc non Af Amer: >60 mL/min (ref >60)
GLUCOSE: 121 mg/dL — AB (ref 65–99)
POTASSIUM: 3.7 mmol/L (ref 3.5–5.1)
Sodium: 140 mmol/L (ref 135–145)

## 2015-03-23 LAB — GLUCOSE, CAPILLARY
GLUCOSE-CAPILLARY: 104 mg/dL — AB (ref 65–99)
GLUCOSE-CAPILLARY: 133 mg/dL — AB (ref 65–99)
GLUCOSE-CAPILLARY: 106 mg/dL — AB (ref 65–99)
Glucose-Capillary: 110 mg/dL — ABNORMAL HIGH (ref 65–99)

## 2015-03-23 LAB — CBC
HEMATOCRIT: 41.4 % (ref 39.0–52.0)
HEMOGLOBIN: 13.7 g/dL (ref 13.0–17.0)
MCH: 28 pg (ref 26.0–34.0)
MCHC: 33.1 g/dL (ref 30.0–36.0)
MCV: 84.7 fL (ref 78.0–100.0)
Platelets: 135 10*3/uL — ABNORMAL LOW (ref 150–400)
RBC: 4.89 MIL/uL (ref 4.22–5.81)
RDW: 14.2 % (ref 11.5–15.5)
WBC: 7.4 10*3/uL (ref 4.0–10.5)

## 2015-03-23 LAB — HEMOGLOBIN A1C
HEMOGLOBIN A1C: 6.3 % — AB (ref 4.8–5.6)
Mean Plasma Glucose: 134 mg/dL

## 2015-03-23 LAB — PROTIME-INR
INR: 1.2 (ref 0.00–1.49)
PROTHROMBIN TIME: 15.4 s — AB (ref 11.6–15.2)

## 2015-03-23 LAB — HEPARIN LEVEL (UNFRACTIONATED)
HEPARIN UNFRACTIONATED: 0.45 [IU]/mL (ref 0.30–0.70)
Heparin Unfractionated: 0.24 IU/mL — ABNORMAL LOW (ref 0.30–0.70)

## 2015-03-23 LAB — TROPONIN I: Troponin I: <0.03 ng/mL (ref <0.031)

## 2015-03-23 MED ORDER — MORPHINE SULFATE (PF) 2 MG/ML IV SOLN
2.0000 mg | INTRAVENOUS | Status: DC | PRN
Start: 2015-03-23 — End: 2015-03-24
  Administered 2015-03-23 – 2015-03-24 (×2): 2 mg via INTRAVENOUS
  Filled 2015-03-23 (×2): qty 1

## 2015-03-23 MED ORDER — SODIUM CHLORIDE 0.9% FLUSH: 3.0000 mL | INTRAVENOUS | Status: DC | PRN

## 2015-03-23 MED ORDER — ISOSORBIDE MONONITRATE ER 30 MG PO TB24
30.0000 mg | ORAL_TABLET | Freq: Every day | ORAL | Status: DC
  Administered 2015-03-23 – 2015-03-24 (×2): 30 mg via ORAL
  Filled 2015-03-23 (×2): qty 1

## 2015-03-23 MED ORDER — NITROGLYCERIN IN D5W 200-5 MCG/ML-% IV SOLN
5.0000 ug/min | Freq: Once | INTRAVENOUS | Status: AC
  Administered 2015-03-23: 30 ug/min via INTRAVENOUS

## 2015-03-23 MED ORDER — MORPHINE SULFATE (PF) 2 MG/ML IV SOLN
2.0000 mg | Freq: Once | INTRAVENOUS | Status: AC
  Administered 2015-03-23: 2 mg via INTRAVENOUS

## 2015-03-23 MED ORDER — SODIUM CHLORIDE 0.9 % WEIGHT BASED INFUSION
3.0000 mL/kg/h | INTRAVENOUS | Status: DC
  Administered 2015-03-24: 3 mL/kg/h via INTRAVENOUS

## 2015-03-23 MED ORDER — TECHNETIUM TC 99M SESTAMIBI GENERIC - CARDIOLITE
30.0000 | Freq: Once | INTRAVENOUS | Status: AC | PRN
  Administered 2015-03-23: 30 via INTRAVENOUS

## 2015-03-23 MED ORDER — SODIUM CHLORIDE 0.9 % IV SOLN: 250.0000 mL | INTRAVENOUS | Status: DC | PRN

## 2015-03-23 MED ORDER — SODIUM CHLORIDE 0.9% FLUSH: 3.0000 mL | Freq: Two times a day (BID) | INTRAVENOUS | Status: DC

## 2015-03-23 MED ORDER — SODIUM CHLORIDE 0.9 % WEIGHT BASED INFUSION: 1.0000 mL/kg/h | INTRAVENOUS | Status: DC

## 2015-03-23 MED ORDER — ASPIRIN 81 MG PO CHEW
81.0000 mg | CHEWABLE_TABLET | ORAL | Status: AC
  Administered 2015-03-24: 81 mg via ORAL
  Filled 2015-03-23: qty 1

## 2015-03-23 MED ORDER — REGADENOSON 0.4 MG/5ML IV SOLN
0.4000 mg | Freq: Once | INTRAVENOUS | Status: AC
  Administered 2015-03-23: 0.4 mg via INTRAVENOUS
  Filled 2015-03-23: qty 5

## 2015-03-23 MED ORDER — MORPHINE SULFATE (PF) 2 MG/ML IV SOLN
2.0000 mg | Freq: Once | INTRAVENOUS | Status: DC
  Filled 2015-03-23: qty 1

## 2015-03-23 MED ORDER — TECHNETIUM TC 99M SESTAMIBI GENERIC - CARDIOLITE
10.0000 | Freq: Once | INTRAVENOUS | Status: AC | PRN
  Administered 2015-03-23: 10 via INTRAVENOUS

## 2015-03-23 MED ORDER — REGADENOSON 0.4 MG/5ML IV SOLN
INTRAVENOUS | Status: AC
  Filled 2015-03-23: qty 5

## 2015-03-23 NOTE — Progress Notes (Signed)
PM Rounding Note:   Pt with abnormal stress test. He is known to have CAD by cath 2014. Given symptoms and findings on stress test, will plan cath in am. NPO at [place].   Chad [name] 03/23/2015 5:30 PM

## 2015-03-23 NOTE — Progress Notes (Addendum)
Addendum Heparin discontinued  ANTICOAGULATION CONSULT NOTE - Follow Up Consult  Pharmacy Consult for heparin Indication: chest pain/ACS  Labs:  Recent Labs  03/22/15 1017 03/22/15 1500 03/22/15 1947 03/23/15 0111 03/23/15 1130  HGB 14.3  --   --  13.7  --   HCT 42.1  --   --  41.4  --   PLT 137*  --   --  135*  --   APTT  --  120*  --   --   --   LABPROT  --  16.0*  --  15.4*  --   INR  --  1.26  --  1.20  --   HEPARINUNFRC  --   --  0.33 0.24* 0.45  CREATININE 0.93  --   --  0.98  --   TROPONINI  --  <0.03 <0.03 <0.03  --      Assessment: 60yo male now therapeutic x 1 on heparin.  Goal of Therapy:  Heparin level 0.3-0.7 units/ml   Plan:  Continue heparin gtt at 1600 units/hr and check another level in 6hr. Daily heparin level and CBC  Thank you for allowing [place] to participate in this patients care. Jens Som, PharmD   03/23/2015,12:43 PM

## 2015-03-23 NOTE — Progress Notes (Signed)
ANTICOAGULATION CONSULT NOTE - Follow Up Consult  Pharmacy Consult for heparin Indication: chest pain/ACS   Labs:  Recent Labs  03/22/15 1017 03/22/15 1500 03/22/15 1947 03/23/15 0111  HGB 14.3  --   --  13.7  HCT 42.1  --   --  41.4  PLT 137*  --   --  135*  APTT  --  120*  --   --   LABPROT  --  16.0*  --  15.4*  INR  --  1.26  --  1.20  HEPARINUNFRC  --   --  0.33 0.24*  CREATININE 0.93  --   --  0.98  TROPONINI  --  <0.03 <0.03 <0.03     Assessment: 61yo male now subtherapeutic on heparin after one level at low end of goal.  Goal of Therapy:  Heparin level 0.3-0.7 units/ml   Plan:  Will increase heparin gtt by 2-3 units/kg/hr to 1600 units/hr and check level in 6hr.  Wynona Neat, PharmD, BCPS  03/23/2015,2:58 AM

## 2015-03-23 NOTE — Progress Notes (Signed)
[facility] PROGRESS NOTE  Chad [name]:[id number] DOB: [birth year] DOA: 03/22/2015 PCP: [name], MD  Assessment/Plan: Chest pain, HEART Score = 5 for story, age, risk factors. Differential includes ACS, GERD, MSK pain.  -His last cardiac catheterization was in 2014 which demonstrated nonobstructive disease.  -continue heparin for now -follow results of Myoview and 2-D echo -troponin neg X3 -cardiology on board, will follow rec's -will start IMDUR -NTG drip discontinue -Continue aspirin, Beta blocker and Omega 3/red yeast -Continue PPI  RBBB/LPFB and 1AVB, stable -will follow cardiology rec's  HTN/HLD stable -Continue current antihypertensive regimen and cholesterol medications   Chronic diastolic heart failure -Judicious use of IVF -echo pending to re-assess EF and heart structure -daily weights and strict intake/output  -condition appears compensated -will plan to resume home diuretics in am  Diabetes mellitus type 2 well-controlled, last a1c 5.5 on 11/03/2014  -A1c 6.3 -Hold oral hypoglycemic agents while inpatient -continue SSI  Obesity s/p gastric bypass surgery -ongoing weight loss -advise/encourage to follow low calorie diet  Hypothyroidism, last TSH 2.86 on 12/29/2014  -Continue synthroid  Code Status: Full Family Communication: wife at bedside  Disposition Plan: remains in the hospital for now; will follow results and cardiology rec's.   Consultants:  Cardiology   Procedures:  Myoview : pending  2-D echo: pending   Antibiotics:  None   HPI/Subjective: Afebrile, no SOB. Patient still complaining of CP, worse after taking NTG off.  Objective: Filed Vitals:   03/23/15 0911 03/23/15 1145  BP:  130/58  Pulse: 70 65  Temp:  97.9 F (36.6 C)  Resp:  18    Intake/Output Summary (Last 24 hours) at 03/23/15 1326 Last data filed at 03/23/15 1323  Gross per 24 hour  Intake   1574 ml  Output   1500 ml  Net     74 ml    Filed Weights   03/22/15 0959 03/22/15 1300 03/23/15 0500  Weight: 134.718 kg (297 lb) 135.8 kg (299 lb 6.2 oz) 135.535 kg (298 lb 12.8 oz)    Exam:   General:  Still complaining of CP; reports worse now that NTG discontinued. No fever and no SOB   Cardiovascular: S1 and S2, no rubs or gallops appreciated on exam  Respiratory: CTA bilaterally  Abdomen: soft, NT, ND, positive BS  Musculoskeletal: trace edema bilaterally   Data Reviewed: Basic Metabolic Panel:  Recent Labs Lab 03/22/15 1017 03/23/15 0111  NA 138 140  K 4.0 3.7  CL 101 104  CO2 26 28  GLUCOSE 135* 121*  BUN 17 15  CREATININE 0.93 0.98  CALCIUM 9.0 8.9   CBC:  Recent Labs Lab 03/22/15 1017 03/23/15 0111  WBC 6.2 7.4  NEUTROABS 4.7  --   HGB 14.3 13.7  HCT 42.1 41.4  MCV 83.7 84.7  PLT 137* 135*   Cardiac Enzymes:  Recent Labs Lab 03/22/15 1500 03/22/15 1947 03/23/15 0111  TROPONINI <0.03 <0.03 <0.03   BNP (last 3 results)  Recent Labs  03/22/15 1017  BNP 18.8   CBG:  Recent Labs Lab 03/22/15 1701 03/22/15 2114 03/23/15 0726 03/23/15 1118  GLUCAP 103* 138* 104* 110*    Recent Results (from the past 240 hour(s))  MRSA PCR Screening     Status: None   Collection Time: 03/22/15  1:46 PM  Result Value Ref Range Status   MRSA by PCR NEGATIVE NEGATIVE Final    Comment:        The GeneXpert MRSA Assay (FDA approved for  NASAL specimens only), is one component of a comprehensive MRSA colonization surveillance program. It is not intended to diagnose MRSA infection nor to guide or monitor treatment for MRSA infections.      Studies: Dg Chest 2 View  03/22/2015  CLINICAL DATA:  Midsternal chest pain and pressure with left arm pain and nausea. Shortness of Breath starting this morning. EXAM: CHEST  2 VIEW COMPARISON:  08/10/2014. FINDINGS: The lungs are clear wiithout focal pneumonia, edema, pneumothorax or pleural effusion. The cardiopericardial silhouette is within  normal limits for size. Old left-sided rib fractures noted. IMPRESSION: No active cardiopulmonary disease. Electronically Signed   By: Misty Stanley M.D.   On: 03/22/2015 10:38    Scheduled Meds: . aspirin EC  81 mg Oral Daily  . insulin aspart  0-9 Units Subcutaneous TID WC  . isosorbide mononitrate  30 mg Oral Daily  . levothyroxine  100 mcg Oral QAC breakfast  . metoprolol tartrate  25 mg Oral BID  .  morphine injection  2 mg Intravenous ONCE-1800  . omega-3 acid ethyl esters  2 capsule Oral BID  . pantoprazole  40 mg Oral BID AC  . polyethylene glycol  17 g Oral Daily  . regadenoson       Continuous Infusions:   Principal Problem:   Chest pain Active Problems:   Diabetes mellitus type 2 with retinopathy ([name])   HTN (hypertension)   HLD (hyperlipidemia)   Hypothyroid   CAD (coronary artery disease)   1St degree AV block   Diastolic CHF, chronic ([name])    Time spent: 30 minutes    Barton Dubois  [facility] Pager [phone]. If 7PM-7AM, please contact night-coverage at [email or web address], password [facility] 03/23/2015, 1:26 PM

## 2015-03-23 NOTE — Plan of Care (Signed)
Problem: Pain Managment: Goal: General experience of comfort will improve Outcome: Progressing Pt this am states to be having no CP. States intermittent bouts of sharp CP throughout the night that quickly resolved. Nitro drip DC'd before stress test this am.

## 2015-03-23 NOTE — Progress Notes (Addendum)
Patient Name: Chad [name] Date of Encounter: 03/23/2015  Principal Problem:   Chest pain Active Problems:   Diabetes mellitus type 2 with retinopathy ([facility])   HTN (hypertension)   HLD (hyperlipidemia)   Hypothyroid   CAD (coronary artery disease)   1St degree AV block   Diastolic CHF, chronic [facility])     Primary Cardiologist: Dr. [name] Patient Profile: 60 yo male w/ PMH of Type 2 DM, HTN, HLD, obesity and mild CAD (30% stenosis in LAD by cath in 01/2013) who presented to [facility] on 03/22/2015 for alternating episodes of burning/shooting chest pain. For NST today.  SUBJECTIVE: Reports still having chest pain overnight. Never fully resolved. Described as a shooting pain. Seen in [place] [facility] for 1-day NST.  OBJECTIVE Filed Vitals:   03/22/15 1300 03/22/15 1730 03/22/15 2100 03/23/15 0500  BP: 138/65 124/59 129/56 128/56  Pulse: 58 59 65 63  Temp: 97.9 F (36.6 C) 98.6 F (37 C) 98.2 F (36.8 C) 98.5 F (36.9 C)  TempSrc: Oral Oral    Resp: 16 18 21 20   Height: 5' 9"  (1.753 m)     Weight: 299 lb 6.2 oz (135.8 kg)   298 lb 12.8 oz (135.535 kg)  SpO2: 97% 99% 93% 98%    Intake/Output Summary (Last 24 hours) at 03/23/15 0741 Last data filed at 03/23/15 0500  Gross per 24 hour  Intake 825.49 ml  Output   1250 ml  Net -424.51 ml   Filed Weights   03/22/15 0959 03/22/15 1300 03/23/15 0500  Weight: 297 lb (134.718 kg) 299 lb 6.2 oz (135.8 kg) 298 lb 12.8 oz (135.535 kg)    PHYSICAL EXAM General: Well developed, well nourished, male in no acute distress. Head: Normocephalic, atraumatic.  Neck: Supple without bruits, JVD not elevated. Lungs:  Resp regular and unlabored, CTA without wheezing or rales. Heart: RRR, S1, S2, no S3, S4, or murmur; no rub. Abdomen: Soft, non-tender, non-distended with normoactive bowel sounds. No hepatomegaly. No rebound/guarding. No obvious abdominal masses. Extremities: No clubbing, cyanosis, or edema. Distal pedal pulses are 2+  bilaterally. Neuro: Alert and oriented X 3. Moves all extremities spontaneously. Psych: Normal affect.   LABS: CBC: Recent Labs  03/22/15 1017 03/23/15 0111  WBC 6.2 7.4  NEUTROABS 4.7  --   HGB 14.3 13.7  HCT 42.1 41.4  MCV 83.7 84.7  PLT 137* 135*   INR: Recent Labs  03/23/15 0111  INR 3.21   Basic Metabolic Panel: Recent Labs  03/22/15 1017 03/23/15 0111  NA 138 140  K 4.0 3.7  CL 101 104  CO2 26 28  GLUCOSE 135* 121*  BUN 17 15  CREATININE 0.93 0.98  CALCIUM 9.0 8.9   Cardiac Enzymes: Recent Labs  03/22/15 1500 03/22/15 1947 03/23/15 0111  TROPONINI <0.03 <0.03 <0.03    Recent Labs  03/22/15 1017  TROPIPOC 0.00   BNP:  B NATRIURETIC PEPTIDE  Date/Time Value Ref Range Status  03/22/2015 10:17 AM 18.8 0.0 - 100.0 pg/mL Final   D-dimer: Recent Labs  03/22/15 1500  DDIMER 0.34  Fasting Lipid Panel: Recent Labs  03/23/15 0111  CHOL 141  HDL 46  LDLCALC 86  TRIG 46  CHOLHDL 3.1   Thyroid Function Tests: Recent Labs  03/22/15 1500  TSH 1.888    TELE:  NSR, HR in mid-50's - 70's. 1st degree AV Block. No atopic events.      ECG: Sinus rhythm, 1st degree AV Block; RBBB; LAFB; No  acute changes from the previous tracing.  ECHO: 02/06/2014 Study Conclusions - Left ventricle: The cavity size was normal. Systolic function was normal. The estimated ejection fraction was in the range of 60% to 65%. Wall motion was normal; there were no regional wall motion abnormalities. Left ventricular diastolic function parameters were normal. - Mitral valve: There was mild regurgitation. - Left atrium: The atrium was mildly dilated. - Right ventricle: Systolic function was normal. - Pulmonary arteries: Systolic pressure was within the normal range.  Radiology/Studies: Dg Chest 2 View: 03/22/2015  CLINICAL DATA:  Midsternal chest pain and pressure with left arm pain and nausea. Shortness of Breath starting this morning. EXAM: CHEST  2  VIEW COMPARISON:  08/10/2014. FINDINGS: The lungs are clear wiithout focal pneumonia, edema, pneumothorax or pleural effusion. The cardiopericardial silhouette is within normal limits for size. Old left-sided rib fractures noted. IMPRESSION: No active cardiopulmonary disease. Electronically Signed   By: Misty Stanley M.D.   On: 03/22/2015 10:38     Current Medications:  . aspirin EC  81 mg Oral Daily  . insulin aspart  0-9 Units Subcutaneous TID WC  . levothyroxine  100 mcg Oral QAC breakfast  . metoprolol tartrate  25 mg Oral BID  . omega-3 acid ethyl esters  2 capsule Oral BID  . pantoprazole  40 mg Oral BID AC  . polyethylene glycol  17 g Oral Daily   . heparin 1,600 Units/hr (03/23/15 0331)  . nitroGLYCERIN 30 mcg/min (03/22/15 1402)    ASSESSMENT AND PLAN:  1. Chest pain in setting of mild CAD - alternating burning/ sharp pain in the left pectoral region. Lasted for several hours but was temporarily relieved with SL NTG and IV NTG.  - Recent cath in 01/2013 showed 30% stenosis in mid-LAD. No obstructive disease was identified. - cyclic troponin values have been negative.  - continue ASA and BB. Discontinue IV Heparin and IV NTG. Consider stating Imdur 71m daily. - Has been NPO since midnight. [facility]  2. HTN - BP has been 111/47 - 139/72 in the past 24 hours - continue current medication regimen  3. HLD - LDL 86 this admission - intolerant to statins in the past due to myalgias - consider alternative such as Zetia.  4. Type 2 DM - A1c at goal, 5.5 in 10/2014. - per admitting team  5. Chronic Diastolic CHF - last echo in 01/2014 showed preserved EF of 60-65% - on Lasix 214mPO PRN prior to admission.   SiArna Medici PA-C 7:41 AM 03/23/2015 Pager: [phone] I have personally seen and examined this patient with [facility]. I agree with the assessment and  plan as outlined above. He is admitted with atypical chest pain. This is sharp pain across his chest wall with radiation into his neck and left arm. The pain has been persistent for at least 24 hours, waxing and waning in intensity. Troponin negative. He is known to have CAD by cath 2014 with 30% mid LAD stenosis. No ischemia on stress test December 2015. D-dimer is normal. Nuclear stress test this am is pending. I will arrange an echo this am to exclude pericardial effusion, assess LVEF. Following the results of his stress test, will make further plans.   Nicanor Mendolia 03/23/2015 10:56 AM

## 2015-03-23 NOTE — Care Management Obs Status (Signed)
[place] NOTIFICATION   Patient Details  Name: Chad [name] MRN: [id number] Date of Birth: [birth year]   Medicare Observation Status Notification Given:  Yes    Bethena Roys, RN 03/23/2015, 12:08 PM

## 2015-03-24 ENCOUNTER — Telehealth: Payer: Self-pay

## 2015-03-24 ENCOUNTER — Observation Stay (HOSPITAL_BASED_OUTPATIENT_CLINIC_OR_DEPARTMENT_OTHER): Payer: Medicare Other

## 2015-03-24 ENCOUNTER — Encounter (HOSPITAL_COMMUNITY): Payer: Self-pay | Admitting: Cardiology

## 2015-03-24 ENCOUNTER — Encounter (HOSPITAL_COMMUNITY)
Admission: EM | Disposition: A | Discharge status: Home / Self Care | Payer: Self-pay | Source: Home / Self Care | Attending: Emergency Medicine

## 2015-03-24 DIAGNOSIS — R079 Chest pain, unspecified: Principal | ICD-10-CM

## 2015-03-24 DIAGNOSIS — I5032 Chronic diastolic (congestive) heart failure: Secondary | ICD-10-CM | POA: Diagnosis not present

## 2015-03-24 DIAGNOSIS — E113553 Type 2 diabetes mellitus with stable proliferative diabetic retinopathy, bilateral: Secondary | ICD-10-CM | POA: Diagnosis not present

## 2015-03-24 DIAGNOSIS — I2510 Atherosclerotic heart disease of native coronary artery without angina pectoris: Secondary | ICD-10-CM | POA: Diagnosis not present

## 2015-03-24 DIAGNOSIS — E785 Hyperlipidemia, unspecified: Secondary | ICD-10-CM | POA: Diagnosis not present

## 2015-03-24 HISTORY — PX: CARDIAC CATHETERIZATION: SHX172

## 2015-03-24 LAB — CBC
HEMATOCRIT: 40.4 % (ref 39.0–52.0)
HEMOGLOBIN: 13.2 g/dL (ref 13.0–17.0)
MCH: 27.4 pg (ref 26.0–34.0)
MCHC: 32.7 g/dL (ref 30.0–36.0)
MCV: 83.8 fL (ref 78.0–100.0)
Platelets: 116 10*3/uL — ABNORMAL LOW (ref 150–400)
RBC: 4.82 MIL/uL (ref 4.22–5.81)
RDW: 14.1 % (ref 11.5–15.5)
WBC: 5.4 10*3/uL (ref 4.0–10.5)

## 2015-03-24 LAB — GLUCOSE, CAPILLARY
GLUCOSE-CAPILLARY: 95 mg/dL (ref 65–99)
GLUCOSE-CAPILLARY: 107 mg/dL — AB (ref 65–99)
Glucose-Capillary: 103 mg/dL — ABNORMAL HIGH (ref 65–99)

## 2015-03-24 SURGERY — LEFT HEART CATH AND CORONARY ANGIOGRAPHY

## 2015-03-24 MED ORDER — HEPARIN SODIUM (PORCINE) 1000 UNIT/ML IJ SOLN
INTRAMUSCULAR | Status: DC | PRN
  Administered 2015-03-24: 6000 [IU] via INTRAVENOUS

## 2015-03-24 MED ORDER — FENTANYL CITRATE (PF) 100 MCG/2ML IJ SOLN
INTRAMUSCULAR | Status: AC
  Filled 2015-03-24: qty 2

## 2015-03-24 MED ORDER — FENTANYL CITRATE (PF) 100 MCG/2ML IJ SOLN
INTRAMUSCULAR | Status: DC | PRN
  Administered 2015-03-24 (×2): 25 ug via INTRAVENOUS

## 2015-03-24 MED ORDER — LIDOCAINE HCL (PF) 1 % IJ SOLN
INTRAMUSCULAR | Status: AC
  Filled 2015-03-24: qty 30

## 2015-03-24 MED ORDER — SALINE SPRAY 0.65 % NA SOLN
1.0000 | NASAL | Status: DC | PRN
  Filled 2015-03-24: qty 44

## 2015-03-24 MED ORDER — SODIUM CHLORIDE 0.9 % WEIGHT BASED INFUSION: 3.0000 mL/kg/h | INTRAVENOUS | Status: AC

## 2015-03-24 MED ORDER — VERAPAMIL HCL 2.5 MG/ML IV SOLN
INTRAVENOUS | Status: AC
  Filled 2015-03-24: qty 2

## 2015-03-24 MED ORDER — HEPARIN SODIUM (PORCINE) 1000 UNIT/ML IJ SOLN
INTRAMUSCULAR | Status: AC
  Filled 2015-03-24: qty 1

## 2015-03-24 MED ORDER — HEPARIN (PORCINE) IN NACL 2-0.9 UNIT/ML-% IJ SOLN
INTRAMUSCULAR | Status: DC | PRN
  Administered 2015-03-24: 10:00:00

## 2015-03-24 MED ORDER — SODIUM CHLORIDE 0.9 % IV SOLN: 250.0000 mL | INTRAVENOUS | Status: DC | PRN

## 2015-03-24 MED ORDER — SODIUM CHLORIDE 0.9% FLUSH: 3.0000 mL | Freq: Two times a day (BID) | INTRAVENOUS | Status: DC

## 2015-03-24 MED ORDER — MIDAZOLAM HCL 2 MG/2ML IJ SOLN
INTRAMUSCULAR | Status: AC
  Filled 2015-03-24: qty 2

## 2015-03-24 MED ORDER — VERAPAMIL HCL 2.5 MG/ML IV SOLN
INTRAVENOUS | Status: DC | PRN
  Administered 2015-03-24: 10:00:00 via INTRA_ARTERIAL

## 2015-03-24 MED ORDER — SODIUM CHLORIDE 0.9% FLUSH: 3.0000 mL | INTRAVENOUS | Status: DC | PRN

## 2015-03-24 MED ORDER — MIDAZOLAM HCL 2 MG/2ML IJ SOLN
INTRAMUSCULAR | Status: DC | PRN
  Administered 2015-03-24: 1 mg via INTRAVENOUS
  Administered 2015-03-24: 2 mg via INTRAVENOUS

## 2015-03-24 SURGICAL SUPPLY — 12 items
CATH INFINITI 5 FR JL3.5 (CATHETERS) ×2 IMPLANT
CATH INFINITI 5FR ANG PIGTAIL (CATHETERS) ×2 IMPLANT
CATH INFINITI JR4 5F (CATHETERS) ×2 IMPLANT
DEVICE RAD COMP TR BAND LRG (VASCULAR PRODUCTS) ×2 IMPLANT
GLIDESHEATH SLEND SS 6F .021 (SHEATH) ×2 IMPLANT
HOVERMATT SINGLE USE (MISCELLANEOUS) ×1 IMPLANT
KIT HEART LEFT (KITS) ×2 IMPLANT
PACK CARDIAC CATHETERIZATION (CUSTOM PROCEDURE TRAY) ×2 IMPLANT
SYR MEDRAD MARK V 150ML (SYRINGE) ×2 IMPLANT
TRANSDUCER W/STOPCOCK (MISCELLANEOUS) ×2 IMPLANT
TUBING CIL FLEX 10 FLL-RA (TUBING) ×2 IMPLANT
WIRE SAFE-T 1.5MM-J .035X260CM (WIRE) ×2 IMPLANT

## 2015-03-24 NOTE — Telephone Encounter (Signed)
Attempted to contact pt regarding discharge from [facility] on 03/24/15. Left message asking pt to call back regarding discharge instructions and/or medications. Advised pt of appt w/ Ignacia Bayley, NP on 04/07/15 at 9:00 w/ [place]. Asked pt to call back if unable to keep this appt.

## 2015-03-24 NOTE — Telephone Encounter (Signed)
-----   Message from Arie Sabina sent at 03/24/2015  1:37 PM EST ----- Regarding: tcm/ph 04/07/15 @ 9:00  Sharolyn Douglas

## 2015-03-24 NOTE — Interval H&P Note (Signed)
History and Physical Interval Note:  03/24/2015 9:19 AM  Chad [name]  has presented today for surgery, with the diagnosis of cp  The various methods of treatment have been discussed with the patient and family. After consideration of risks, benefits and other options for treatment, the patient has consented to  Procedure(s): Left Heart Cath and Coronary Angiography (N/A) as a surgical intervention .  The patient's history has been reviewed, patient examined, no change in status, stable for surgery.  I have reviewed the patient's chart and labs.  Questions were answered to the patient's satisfaction.   Cath Lab Visit (complete for each Cath Lab visit)  Clinical Evaluation Leading to the Procedure:   ACS: Yes.    Non-ACS:    Anginal Classification: CCS IV  Anti-ischemic medical therapy: Maximal Therapy (2 or more classes of medications)  Non-Invasive Test Results: Intermediate-risk stress test findings: cardiac mortality 1-3%/year  Prior CABG: No previous CABG        Peter Martinique MD,FACC 03/24/2015 9:19 AM

## 2015-03-24 NOTE — Discharge Instructions (Signed)
Radial Site Care °Refer to this sheet in the next few weeks. These instructions provide you with information about caring for yourself after your procedure. Your health care provider may also give you more specific instructions. Your treatment has been planned according to current medical practices, but problems sometimes occur. Call your health care provider if you have any problems or questions after your procedure. °WHAT TO EXPECT AFTER THE PROCEDURE °After your procedure, it is typical to have the following: °· Bruising at the radial site that usually fades within 1-2 weeks. °· Blood collecting in the tissue (hematoma) that may be painful to the touch. It should usually decrease in size and tenderness within 1-2 weeks. °HOME CARE INSTRUCTIONS °· Take medicines only as directed by your health care provider. °· You may shower 24-48 hours after the procedure or as directed by your health care provider. Remove the bandage (dressing) and gently wash the site with plain soap and water. Pat the area dry with a clean towel. Do not rub the site, because this may cause bleeding. °· Do not take baths, swim, or use a hot tub until your health care provider approves. °· Check your insertion site every day for redness, swelling, or drainage. °· Do not apply powder or lotion to the site. °· Do not flex or bend the affected arm for 24 hours or as directed by your health care provider. °· Do not push or pull heavy objects with the affected arm for 24 hours or as directed by your health care provider. °· Do not lift over 10 lb (4.5 kg) for 5 days after your procedure or as directed by your health care provider. °· Ask your health care provider when it is okay to: °¨ Return to work or school. °¨ Resume usual physical activities or sports. °¨ Resume sexual activity. °· Do not drive home if you are discharged the same day as the procedure. Have someone else drive you. °· You may drive 24 hours after the procedure unless otherwise  instructed by your health care provider. °· Do not operate machinery or power tools for 24 hours after the procedure. °· If your procedure was done as an outpatient procedure, which means that you went home the same day as your procedure, a responsible adult should be with you for the first 24 hours after you arrive home. °· Keep all follow-up visits as directed by your health care provider. This is important. °SEEK MEDICAL CARE IF: °· You have a fever. °· You have chills. °· You have increased bleeding from the radial site. Hold pressure on the site. °SEEK IMMEDIATE MEDICAL CARE IF: °· You have unusual pain at the radial site. °· You have redness, warmth, or swelling at the radial site. °· You have drainage (other than a small amount of blood on the dressing) from the radial site. °· The radial site is bleeding, and the bleeding does not stop after 30 minutes of holding steady pressure on the site. °· Your arm or hand becomes pale, cool, tingly, or numb. °  °This information is not intended to replace advice given to you by your health care provider. Make sure you discuss any questions you have with your health care provider. °  °Document Released: 03/12/2010 Document Revised: 02/28/2014 Document Reviewed: 08/26/2013 °Elsevier Interactive Patient Education ©2016 [employer] ° °

## 2015-03-24 NOTE — Progress Notes (Signed)
     SUBJECTIVE: Having chest pain this am.   Tele: sinus  BP 136/61 mmHg  Pulse 65  Temp(Src) 98 F (36.7 C) (Oral)  Resp 16  Ht 5' 9"  (1.753 m)  Wt 296 lb 12.8 oz (134.628 kg)  BMI 43.81 kg/m2  SpO2 98%  Intake/Output Summary (Last 24 hours) at 03/24/15 0847 Last data filed at 03/23/15 1700  Gross per 24 hour  Intake 1228.51 ml  Output    250 ml  Net 978.51 ml    PHYSICAL EXAM General: Well developed, well nourished, in no acute distress. Alert and oriented x 3.  Psych:  Good affect, responds appropriately Neck: No JVD. No masses noted.  Lungs: Clear bilaterally with no wheezes or rhonci noted.  Heart: RRR with no murmurs noted. Abdomen: Bowel sounds are present. Soft, non-tender.  Extremities: No lower extremity edema.   LABS: Basic Metabolic Panel:  Recent Labs  03/22/15 1017 03/23/15 0111  NA 138 140  K 4.0 3.7  CL 101 104  CO2 26 28  GLUCOSE 135* 121*  BUN 17 15  CREATININE 0.93 0.98  CALCIUM 9.0 8.9   CBC:  Recent Labs  03/22/15 1017 03/23/15 0111 03/24/15 0659  WBC 6.2 7.4 5.4  NEUTROABS 4.7  --   --   HGB 14.3 13.7 13.2  HCT 42.1 41.4 40.4  MCV 83.7 84.7 83.8  PLT 137* 135* PENDING   Cardiac Enzymes:  Recent Labs  03/22/15 1500 03/22/15 1947 03/23/15 0111  TROPONINI <0.03 <0.03 <0.03   Fasting Lipid Panel:  Recent Labs  03/23/15 0111  CHOL 141  HDL 46  LDLCALC 86  TRIG 46  CHOLHDL 3.1    Current Meds: . aspirin EC  81 mg Oral Daily  . insulin aspart  0-9 Units Subcutaneous TID WC  . isosorbide mononitrate  30 mg Oral Daily  . levothyroxine  100 mcg Oral QAC breakfast  . metoprolol tartrate  25 mg Oral BID  . omega-3 acid ethyl esters  2 capsule Oral BID  . pantoprazole  40 mg Oral BID AC  . polyethylene glycol  17 g Oral Daily  . sodium chloride flush  3 mL Intravenous Q12H     ASSESSMENT AND PLAN:  1. CAD/Chest pain: He has chest pain that has many atypical features but this pain has been severe at times  over the last few days with associated SOB. Radiation to neck and jaw. Troponin negative. Nuclear stress test with possible ischemia. He is known to have CAD with last cath December 2014 with 30% mid LAD stenosis. Plan cardiac cath with possible PCI today. Labs ok. Echo pending this am   MCALHANY,Chad [name]  1/31/20178:47 AM

## 2015-03-24 NOTE — H&P (View-Only) (Signed)
     SUBJECTIVE: Having chest pain this am.   Tele: sinus  BP 136/61 mmHg  Pulse 65  Temp(Src) 98 F (36.7 C) (Oral)  Resp 16  Ht 5' 9"  (1.753 m)  Wt 296 lb 12.8 oz (134.628 kg)  BMI 43.81 kg/m2  SpO2 98%  Intake/Output Summary (Last 24 hours) at 03/24/15 0847 Last data filed at 03/23/15 1700  Gross per 24 hour  Intake 1228.51 ml  Output    250 ml  Net 978.51 ml    PHYSICAL EXAM General: Well developed, well nourished, in no acute distress. Alert and oriented x 3.  Psych:  Good affect, responds appropriately Neck: No JVD. No masses noted.  Lungs: Clear bilaterally with no wheezes or rhonci noted.  Heart: RRR with no murmurs noted. Abdomen: Bowel sounds are present. Soft, non-tender.  Extremities: No lower extremity edema.   LABS: Basic Metabolic Panel:  Recent Labs  03/22/15 1017 03/23/15 0111  NA 138 140  K 4.0 3.7  CL 101 104  CO2 26 28  GLUCOSE 135* 121*  BUN 17 15  CREATININE 0.93 0.98  CALCIUM 9.0 8.9   CBC:  Recent Labs  03/22/15 1017 03/23/15 0111 03/24/15 0659  WBC 6.2 7.4 5.4  NEUTROABS 4.7  --   --   HGB 14.3 13.7 13.2  HCT 42.1 41.4 40.4  MCV 83.7 84.7 83.8  PLT 137* 135* PENDING   Cardiac Enzymes:  Recent Labs  03/22/15 1500 03/22/15 1947 03/23/15 0111  TROPONINI <0.03 <0.03 <0.03   Fasting Lipid Panel:  Recent Labs  03/23/15 0111  CHOL 141  HDL 46  LDLCALC 86  TRIG 46  CHOLHDL 3.1    Current Meds: . aspirin EC  81 mg Oral Daily  . insulin aspart  0-9 Units Subcutaneous TID WC  . isosorbide mononitrate  30 mg Oral Daily  . levothyroxine  100 mcg Oral QAC breakfast  . metoprolol tartrate  25 mg Oral BID  . omega-3 acid ethyl esters  2 capsule Oral BID  . pantoprazole  40 mg Oral BID AC  . polyethylene glycol  17 g Oral Daily  . sodium chloride flush  3 mL Intravenous Q12H     ASSESSMENT AND PLAN:  1. CAD/Chest pain: He has chest pain that has many atypical features but this pain has been severe at times  over the last few days with associated SOB. Radiation to neck and jaw. Troponin negative. Nuclear stress test with possible ischemia. He is known to have CAD with last cath December 2014 with 30% mid LAD stenosis. Plan cardiac cath with possible PCI today. Labs ok. Echo pending this am   Chad [name]  1/31/20178:47 AM

## 2015-03-24 NOTE — Discharge Summary (Signed)
Physician Discharge Summary  Chad [name] HWE:[id number] DOB: 02/10/1956 DOA: 03/22/2015  PCP: [name], MD  Admit date: 03/22/2015 Discharge date: 03/24/2015  Time spent: 35 minutes  Recommendations for Outpatient Follow-up:  Repeat BMET to follow electrolytes and renal function Reassess BP and adjust antihypertensive regimen as needed   Discharge Diagnoses:  Principal Problem:   Chest pain Active Problems:   Diabetes mellitus type 2 with retinopathy ([place])   HTN (hypertension)   HLD (hyperlipidemia)   Hypothyroid   CAD (coronary artery disease)   1St degree AV block   Diastolic CHF, chronic (HCC)   Pain in the chest   Discharge Condition: Stable and improved. Discharge home with instructions to follow up with cardiology as instructed and with PCP in apporx 2 weeks (patient will set up appointment)   Diet recommendation: low sodium diet and modified carb diet  Filed Weights   03/22/15 1300 03/23/15 0500 03/24/15 0546  Weight: 135.8 kg (299 lb 6.2 oz) 135.535 kg (298 lb 12.8 oz) 134.628 kg (296 lb 12.8 oz)    History of present illness:  60 y.o. year-old male with history of nonobstructive CAD, chronic diastolic heart failure, obstructive sleep apnea, morbid obesity, diabetes mellitus type 2 without compensation, GERD who presents with chest pain. The patient was last at their baseline health until about a week ago when he developed progressive shortness of breath. He also noted some mild lower extremity edema. He started having intermittent sharp or burning pains that radiated from his left shoulder down his left arm associated with shortness of breath and nausea which occurred both at rest and with exertion. He used his albuterol inhaler and would rest and the pain would eventually go away after an hour. He had a proximally 2-3 episodes of this type of pain prior to this morning. He did not have shoulder pain with exertion but he did have dyspnea with exertion over this  timeframe. This morning, he developed sudden onset substernal sharp 10 out of 10 pain that radiated across his chest, to his right neck and jaw and which eventually went down his left arm which was associated with shortness of breath and nausea. He denied lightheadedness or diaphoresis. He called for EMS and took nitroglycerin. Nitroglycerin started to take his pain away and he received several additional doses which eventually improved but did not resolve his pain.  Hospital Course:  Chest pain, HEART Score = 5 (for story, age, risk factors). Differential includes ACS, GERD, MSK pain.  -His last cardiac catheterization was in 2014 which demonstrated nonobstructive disease.  -initially placed on heparin and NTG drip -Myoview intermediate risk and 2-D echo reassuring  -troponin neg X3 -given still ongoing CP and Myoview results had left heart cath: results below; but essentially unchanged non-obstructive CAD. -cardiology recommended medical management and risk factor modifications -Continue aspirin, Beta blocker and Omega 3/red yeast -Continue PPI  RBBB/LPFB and 1AVB, stable and unchanged  -will follow cardiology rec's  HTN/HLD stable -Continue current antihypertensive regimen and cholesterol medications (unable to tolerate statins)  Chronic diastolic heart failure -advise to follow low sodium diet and to check weight daily -echo with preserved EF (50-55%) and no interval changes from previous study -condition appears compensated -will plan to resume home diuretics and Lopressor   Diabetes mellitus type 2 well-controlled, last a1c 5.5 on 11/03/2014  -A1c 6.3 during this admission  -resume home oral hypoglycemic regimen   Obesity s/p gastric bypass surgery -ongoing weight loss -advise/encourage to follow low calorie diet  Hypothyroidism, last TSH 2.86 on 12/29/2014  -Continue synthroid  Procedures: Myoview: 1/30 Nuclear stress EF: 56%.  There was no ST segment deviation  noted during stress.  T wave inversion was noted during stress in the V1 and V3 leads, beginning at 0 minutes of stress. T wave inversion persisted.  Moderate size and intensity, partially reversible inferior defect suggestive of ischemia. Elevated TID ratio of 1.24. LVEF 56% with normal wall motion. This is an intermediate risk study.   Left heart cath 1/31 Prox RCA to Mid RCA lesion, 10% stenosed. Prox LAD to Mid LAD lesion, 25% stenosed. Prox Cx lesion, 30% stenosed. The left ventricular systolic function is normal.  1. Nonobstructive CAD. No significant change since 2014. 2. Normal LV function.  2-D echo: 1/31 - Left ventricle: The cavity size was normal. Wall thickness was increased in a pattern of mild LVH. Systolic function was normal. The estimated ejection fraction was in the range of 50% to 55%. Wall motion was normal; there were no regional wall motion abnormalities. - Mitral valve: There was mild regurgitation. - Right ventricle: The cavity size was mildly dilated. Wall thickness was normal. - Right atrium: The atrium was moderately dilated.  Impressions: - Compared to the prior study, there has been no significant interval change.  Consultations: Cardiology   Discharge Exam: Filed Vitals:   03/24/15 1425 03/24/15 1455  BP: 134/64 136/66  Pulse: 61 53  Temp:    Resp:     General: Still with some intermittent complains of CP; overall better. No fever and no SOB  Cardiovascular: S1 and S2, no rubs or gallops appreciated on exam Respiratory: CTA bilaterally Abdomen: soft, NT, ND, positive BS Musculoskeletal: trace edema bilaterally  Discharge Instructions   Discharge Instructions    Diet - low sodium heart healthy    Complete by:  As directed      Discharge instructions    Complete by:  As directed   Follow low sodium heart healthy diet Take medications as prescribed Follow up with cardiology service as instructed  Keep yourself well  hydrated          Current Discharge Medication List    CONTINUE these medications which have NOT CHANGED   Details  albuterol (PROVENTIL HFA;VENTOLIN HFA) 108 (90 BASE) MCG/ACT inhaler Inhale 2 puffs into the lungs every 6 (six) hours as needed for wheezing or shortness of breath. Qty: 1 Inhaler, Refills: 3    aspirin EC 81 MG tablet Take 162 mg by mouth daily.      docusate sodium 100 MG CAPS Take 100 mg by mouth daily as needed for mild constipation. Qty: 30 capsule, Refills: 0    Flaxseed, Linseed, (FLAX SEED OIL) 1000 MG CAPS Take 1 capsule by mouth 2 (two) times daily.      furosemide (LASIX) 20 MG tablet Take 1 tablet (20 mg total) by mouth daily as needed for edema.    HYDROcodone-acetaminophen (NORCO) 10-325 MG tablet Take 1 tablet by mouth 3 (three) times daily as needed for moderate pain. Qty: 60 tablet, Refills: 0    levothyroxine (SYNTHROID, LEVOTHROID) 100 MCG tablet TAKE 1 TABLET BY MOUTH ONCE A DAY Qty: 30 tablet, Refills: 11    metoCLOPramide (REGLAN) 10 MG tablet TAKE 1 TABLET BY MOUTH 3 TIMES DAILY BEFORE MEALS Qty: 90 tablet, Refills: 11    metoprolol tartrate (LOPRESSOR) 25 MG tablet TAKE 1 TABLET BY MOUTH TWICE A DAY Qty: 60 tablet, Refills: 11    Multiple Vitamin (MULITIVITAMIN WITH  MINERALS) TABS Take 1 tablet by mouth daily.    NEXIUM 40 MG capsule TAKE 1 CAPSULE BY MOUTH TWICE DAILY Qty: 60 capsule, Refills: 6    nitroGLYCERIN (NITROSTAT) 0.4 MG SL tablet Place 1 tablet (0.4 mg total) under the tongue every 5 (five) minutes as needed for chest pain. Qty: 60 tablet, Refills: 3    omega-3 acid ethyl esters (LOVAZA) 1 G capsule TAKE 2 CAPSULES BY MOUTH TWICE A DAY Qty: 120 capsule, Refills: 11    polyethylene glycol powder (GLYCOLAX/MIRALAX) powder Take 17 g by mouth daily. Qty: 255 g, Refills: 3   Associated Diagnoses: Constipation    potassium chloride (K-DUR) 10 MEQ tablet Take 10 mEq by mouth daily as needed (ONLY WHEN TAKING FUROSEMIDE).      Red Yeast Rice 600 MG CAPS Take 1 capsule by mouth 2 (two) times daily.     traMADol (ULTRAM) 50 MG tablet TAKE 1 TABLET BY MOUTH TWICE A DAY AS NEEDED Qty: 30 tablet, Refills: 0       Allergies  Allergen Reactions  . Codeine Nausea Only  . Nsaids Other (See Comments)    Acid Reflux and palpitations  . Statins Other (See Comments)    Bad leg cramps  . Sulfa Drugs Cross Reactors Nausea Only   Follow-up Information    Follow up with Murray Hodgkins, NP On 04/07/2015.   Specialties:  Nurse Practitioner, Cardiology, Radiology   Why:  9:00 am   Contact information:   [address] [place] [address] [phone]        The results of significant diagnostics from this hospitalization (including imaging, microbiology, ancillary and laboratory) are listed below for reference.    Significant Diagnostic Studies: Dg Chest 2 View  03/22/2015  CLINICAL DATA:  Midsternal chest pain and pressure with left arm pain and nausea. Shortness of Breath starting this morning. EXAM: CHEST  2 VIEW COMPARISON:  08/10/2014. FINDINGS: The lungs are clear wiithout focal pneumonia, edema, pneumothorax or pleural effusion. The cardiopericardial silhouette is within normal limits for size. Old left-sided rib fractures noted. IMPRESSION: No active cardiopulmonary disease. Electronically Signed   By: Misty Stanley M.D.   On: 03/22/2015 10:38   Nm Myocar Multi W/spect W/wall Motion / Ef  03/23/2015   Defect 1: There is a medium defect of moderate severity.  Findings consistent with ischemia.  Nuclear stress EF: 56%.  There was no ST segment deviation noted during stress.  T wave inversion was noted during stress in the V1 and V3 leads, beginning at 0 minutes of stress. T wave inversion persisted.  Moderate size and intensity, partially reversible inferior defect suggestive of ischemia. Elevated TID ratio of 1.24. LVEF 56% with normal wall motion. This is an intermediate risk study.     Microbiology: Recent Results (from the past 240 hour(s))  MRSA PCR Screening     Status: None   Collection Time: 03/22/15  1:46 PM  Result Value Ref Range Status   MRSA by PCR NEGATIVE NEGATIVE Final    Comment:        The GeneXpert MRSA Assay (FDA approved for NASAL specimens only), is one component of a comprehensive MRSA colonization surveillance program. It is not intended to diagnose MRSA infection nor to guide or monitor treatment for MRSA infections.      Labs: Basic Metabolic Panel:  Recent Labs Lab 03/22/15 1017 03/23/15 0111  NA 138 140  K 4.0 3.7  CL 101 104  CO2 26 28  GLUCOSE 135* 121*  BUN 17 15  CREATININE 0.93 0.98  CALCIUM 9.0 8.9   CBC:  Recent Labs Lab 03/22/15 1017 03/23/15 0111 03/24/15 0659  WBC 6.2 7.4 5.4  NEUTROABS 4.7  --   --   HGB 14.3 13.7 13.2  HCT 42.1 41.4 40.4  MCV 83.7 84.7 83.8  PLT 137* 135* 116*   Cardiac Enzymes:  Recent Labs Lab 03/22/15 1500 03/22/15 1947 03/23/15 0111  TROPONINI <0.03 <0.03 <0.03   BNP: BNP (last 3 results)  Recent Labs  03/22/15 1017  BNP 18.8   CBG:  Recent Labs Lab 03/23/15 1118 03/23/15 1625 03/23/15 2211 03/24/15 0730 03/24/15 1122  GLUCAP 110* 133* 106* 95 103*    Signed:  Barton Dubois MD.  [facility] 03/24/2015, 3:56 PM

## 2015-03-24 NOTE — Progress Notes (Signed)
  Echocardiogram 2D Echocardiogram has been performed.  Anddy Wingert 03/24/2015, 9:09 AM

## 2015-03-25 ENCOUNTER — Telehealth: Payer: Self-pay

## 2015-03-25 ENCOUNTER — Encounter: Payer: Self-pay | Admitting: Family Medicine

## 2015-03-25 NOTE — Telephone Encounter (Signed)
Pt returned your phone call. Please call back at [phone]

## 2015-03-25 NOTE — Telephone Encounter (Signed)
Transition Care Management Follow-up Telephone Call   Date discharged? 03/24/2015   How have you been since you were released from the hospital? Health status improving   Do you understand why you were in the hospital? YES    Do you understand the discharge instructions? YES   Where were you discharged to? HOME   Items Reviewed:  Medications reviewed: YES - no issues/concerns with taking medications as prescribed  Allergies reviewed: YES  Dietary changes reviewed: YES, low sodium heart healthy - verbalizes compliance  Referrals reviewed: YES, cardiology appt scheduled   Functional Questionnaire:   Activities of Daily Living (ADLs):   He states they are independent in the following: independent with ADLs States they require assistance with the following: None   Any transportation issues/concerns?: None   Any patient concerns? Yes, leg pain. Pain scale 7/10.   Confirmed importance and date/time of follow-up visits scheduled   Provider Appointment booked with Dr. Danise Mina on 04/03/15  Confirmed with patient if condition begins to worsen call PCP or go to the ER.  Patient was given the office number and encouraged to call back with question or concerns.  : YES

## 2015-03-26 ENCOUNTER — Encounter (HOSPITAL_COMMUNITY): Payer: Self-pay | Admitting: Cardiology

## 2015-04-03 ENCOUNTER — Ambulatory Visit (INDEPENDENT_AMBULATORY_CARE_PROVIDER_SITE_OTHER): Payer: Medicare Other | Admitting: Family Medicine

## 2015-04-03 ENCOUNTER — Other Ambulatory Visit: Payer: Self-pay | Admitting: Family Medicine

## 2015-04-03 ENCOUNTER — Encounter: Payer: Self-pay | Admitting: *Deleted

## 2015-04-03 ENCOUNTER — Encounter: Payer: Self-pay | Admitting: Family Medicine

## 2015-04-03 VITALS — BP 140/70 | HR 59 | Temp 98.3°F | Wt 309.0 lb

## 2015-04-03 DIAGNOSIS — Z79891 Long term (current) use of opiate analgesic: Secondary | ICD-10-CM | POA: Diagnosis not present

## 2015-04-03 DIAGNOSIS — R740 Nonspecific elevation of levels of transaminase and lactic acid dehydrogenase [LDH]: Secondary | ICD-10-CM

## 2015-04-03 DIAGNOSIS — Z114 Encounter for screening for human immunodeficiency virus [HIV]: Secondary | ICD-10-CM

## 2015-04-03 DIAGNOSIS — M1711 Unilateral primary osteoarthritis, right knee: Secondary | ICD-10-CM

## 2015-04-03 DIAGNOSIS — R0600 Dyspnea, unspecified: Secondary | ICD-10-CM

## 2015-04-03 DIAGNOSIS — Z9884 Bariatric surgery status: Secondary | ICD-10-CM

## 2015-04-03 DIAGNOSIS — E113553 Type 2 diabetes mellitus with stable proliferative diabetic retinopathy, bilateral: Secondary | ICD-10-CM | POA: Diagnosis not present

## 2015-04-03 DIAGNOSIS — Z125 Encounter for screening for malignant neoplasm of prostate: Secondary | ICD-10-CM | POA: Diagnosis not present

## 2015-04-03 DIAGNOSIS — G4733 Obstructive sleep apnea (adult) (pediatric): Secondary | ICD-10-CM

## 2015-04-03 DIAGNOSIS — K7460 Unspecified cirrhosis of liver: Secondary | ICD-10-CM | POA: Insufficient documentation

## 2015-04-03 DIAGNOSIS — D1779 Benign lipomatous neoplasm of other sites: Secondary | ICD-10-CM

## 2015-04-03 DIAGNOSIS — K7581 Nonalcoholic steatohepatitis (NASH): Secondary | ICD-10-CM

## 2015-04-03 DIAGNOSIS — R072 Precordial pain: Secondary | ICD-10-CM

## 2015-04-03 DIAGNOSIS — I2510 Atherosclerotic heart disease of native coronary artery without angina pectoris: Secondary | ICD-10-CM

## 2015-04-03 DIAGNOSIS — I5032 Chronic diastolic (congestive) heart failure: Secondary | ICD-10-CM

## 2015-04-03 DIAGNOSIS — R7401 Elevation of levels of liver transaminase levels: Secondary | ICD-10-CM

## 2015-04-03 DIAGNOSIS — Z1159 Encounter for screening for other viral diseases: Secondary | ICD-10-CM

## 2015-04-03 DIAGNOSIS — I10 Essential (primary) hypertension: Secondary | ICD-10-CM

## 2015-04-03 DIAGNOSIS — D3501 Benign neoplasm of right adrenal gland: Secondary | ICD-10-CM

## 2015-04-03 LAB — COMPREHENSIVE METABOLIC PANEL
ALBUMIN: 4 g/dL (ref 3.5–5.2)
ALK PHOS: 137 U/L — AB (ref 39–117)
ALT: 27 U/L (ref 0–53)
AST: 29 U/L (ref 0–37)
BILIRUBIN TOTAL: 0.8 mg/dL (ref 0.2–1.2)
BUN: 14 mg/dL (ref 6–23)
CALCIUM: 9.2 mg/dL (ref 8.4–10.5)
CHLORIDE: 101 meq/L (ref 96–112)
CO2: 30 mEq/L (ref 19–32)
CREATININE: 0.94 mg/dL (ref 0.40–1.50)
GFR: 86.98 mL/min (ref >60.00)
Glucose, Bld: 158 mg/dL — ABNORMAL HIGH (ref 70–99)
Potassium: 3.7 mEq/L (ref 3.5–5.1)
SODIUM: 138 meq/L (ref 135–145)
TOTAL PROTEIN: 6.2 g/dL (ref 6.0–8.3)

## 2015-04-03 LAB — GAMMA GT: GGT: 163 U/L — AB (ref 7–51)

## 2015-04-03 LAB — PSA, MEDICARE: PSA: 1.44 ng/mL (ref 0.10–4.00)

## 2015-04-03 NOTE — Assessment & Plan Note (Addendum)
Recent CXR and echo stable. Ambulatory pulse ox today - does not drop below 95%. Anticipate multifactorial predominantly due to morbid obesity (restrictive lung disease) with deconditioning and chronic dCHF but with endorsed asthma history will refer to pulm to discuss possible PFTs. Pt agrees with plan. Likely with uncontrolled OSA but unable to tolerate CPAP machine.

## 2015-04-03 NOTE — Assessment & Plan Note (Signed)
S/p reassuring eval in hospital. Thought MSK. This has resolved. Discussed limiting heavy lifting.

## 2015-04-03 NOTE — Patient Instructions (Addendum)
Ambulatory pulse ox today. We will refer you to pulmonary doctor for further evaluation of shortness of breath.  Blood work today.  Return as needed or next month for medicare wellness visit.

## 2015-04-03 NOTE — Progress Notes (Signed)
BP 140/70 mmHg  Pulse 59  Temp(Src) 98.3 F (36.8 C) (Tympanic)  Wt 309 lb (140.161 kg)  SpO2 95%   CC: hosp f/u visit  Subjective:    Patient ID: Chad [name], male    DOB: 10-06-1955, 60 y.o.   MRN: [id number]  HPI: CLEMENTS TORO is a 60 y.o. male presenting on 04/03/2015 for Hospitalization Follow-up   Recent hospitalization for chest pain and progressive dyspnea - chest pain described as substernal 10/10 sharp pain relieved with nitro. Workup in hospital included unchanged non-obstructive CAD by L heart catheterization. He had stable RBBB. Chronic dCHF - echo stable EF 50-55%. Thought MSK discomfort.  He finds he stays short winded at rest and with exertion. Using albuterol 4-5 times daily. Worse when lays flat at night time.  Thought he strained chest wall while caring for wife who just had knee surgery. He also lifted 2 50lb gabs of chicken feed at once.   Planning on seeing Dr Noemi Chapel to discuss worsening knee osteoarthritis. Advised not to walk  He is concerned because weight has started increasing - after strong weight loss effort where he had lost a total of 120+ lbs over last 2 yrs. Peak weight 387lbs. Nadir down to 260lbs   Increased nocturia and daytime urgency, some dribbling.   Admit date: 03/22/2015 Discharge date: 03/24/2015 F/u phone call: 03/25/2015  Recommendations for Outpatient Follow-up:  Repeat BMET to follow electrolytes and renal function Reassess BP and adjust antihypertensive regimen as needed   Discharge Diagnoses:  Principal Problem:  Chest pain Active Problems:  Diabetes mellitus type 2 with retinopathy ([place])  HTN (hypertension)  HLD (hyperlipidemia)  Hypothyroid  CAD (coronary artery disease)  1St degree AV block  Diastolic CHF, chronic (HCC)  Pain in the chest  Discharge Condition: Stable and improved. Discharge home with instructions to follow up with cardiology as instructed and with PCP in apporx 2 weeks (patient will set  up appointment)   Diet recommendation: low sodium diet and modified carb diet  Relevant past medical, surgical, family and social history reviewed and updated as indicated. Interim medical history since our last visit reviewed. Allergies and medications reviewed and updated. Current Outpatient Prescriptions on File Prior to Visit  Medication Sig  . albuterol (PROVENTIL HFA;VENTOLIN HFA) 108 (90 BASE) MCG/ACT inhaler Inhale 2 puffs into the lungs every 6 (six) hours as needed for wheezing or shortness of breath.  Marland Kitchen aspirin EC 81 MG tablet Take 162 mg by mouth daily.    Marland Kitchen docusate sodium 100 MG CAPS Take 100 mg by mouth daily as needed for mild constipation.  . furosemide (LASIX) 20 MG tablet Take 1 tablet (20 mg total) by mouth daily as needed for edema.  Marland Kitchen HYDROcodone-acetaminophen (NORCO) 10-325 MG tablet Take 1 tablet by mouth 3 (three) times daily as needed for moderate pain.  Marland Kitchen levothyroxine (SYNTHROID, LEVOTHROID) 100 MCG tablet TAKE 1 TABLET BY MOUTH ONCE A DAY  . metoCLOPramide (REGLAN) 10 MG tablet TAKE 1 TABLET BY MOUTH 3 TIMES DAILY BEFORE MEALS  . metoprolol tartrate (LOPRESSOR) 25 MG tablet TAKE 1 TABLET BY MOUTH TWICE A DAY  . Multiple Vitamin (MULITIVITAMIN WITH MINERALS) TABS Take 1 tablet by mouth daily.  Marland Kitchen NEXIUM 40 MG capsule TAKE 1 CAPSULE BY MOUTH TWICE DAILY  . nitroGLYCERIN (NITROSTAT) 0.4 MG SL tablet Place 1 tablet (0.4 mg total) under the tongue every 5 (five) minutes as needed for chest pain.  Marland Kitchen omega-3 acid ethyl esters (LOVAZA) 1 G  capsule TAKE 2 CAPSULES BY MOUTH TWICE A DAY  . potassium chloride (K-DUR) 10 MEQ tablet Take 10 mEq by mouth daily as needed (ONLY WHEN TAKING FUROSEMIDE).   . Red Yeast Rice 600 MG CAPS Take 1 capsule by mouth 2 (two) times daily.   . traMADol (ULTRAM) 50 MG tablet TAKE 1 TABLET BY MOUTH TWICE A DAY AS NEEDED (Patient taking differently: TAKE 1 TABLET BY MOUTH TWICE A DAY AS NEEDED for pain)   No current facility-administered  medications on file prior to visit.    Review of Systems Per HPI unless specifically indicated in ROS section     Objective:    BP 140/70 mmHg  Pulse 59  Temp(Src) 98.3 F (36.8 C) (Tympanic)  Wt 309 lb (140.161 kg)  SpO2 95%  Wt Readings from Last 3 Encounters:  04/03/15 309 lb (140.161 kg)  03/24/15 296 lb 12.8 oz (134.628 kg)  01/29/15 294 lb 4 oz (133.471 kg)    Physical Exam  Constitutional: He appears well-developed and well-nourished. No distress.  obese  HENT:  Mouth/Throat: Oropharynx is clear and moist. No oropharyngeal exudate.  Neck: No thyromegaly present.  Cardiovascular: Normal rate, regular rhythm, normal heart sounds and intact distal pulses.   No murmur heard. Pulmonary/Chest: Effort normal and breath sounds normal. No respiratory distress. He has no wheezes. He has no rales.  Musculoskeletal: He exhibits no edema.  Psychiatric: He has a normal mood and affect.  Nursing note and vitals reviewed.  Left heart cath 1/31 Prox RCA to Mid RCA lesion, 10% stenosed. Prox LAD to Mid LAD lesion, 25% stenosed. Prox Cx lesion, 30% stenosed. The left ventricular systolic function is normal. 1. Nonobstructive CAD. No significant change since 2014. 2. Normal LV function.  2-D echo: 1/31 - Left ventricle: The cavity size was normal. Wall thickness was increased in a pattern of mild LVH. Systolic function was normal.The estimated ejection fraction was in the range of 50% to 55%. Wall motion was normal; there were no regional wall motionabnormalities. - Mitral valve: There was mild regurgitation. - Right ventricle: The cavity size was mildly dilated. Wallthickness was normal. - Right atrium: The atrium was moderately dilated. Impressions: - Compared to the prior study, there has been no significant interval change.  CHEST 2 VIEW COMPARISON: 08/10/2014. FINDINGS: The lungs are clear wiithout focal pneumonia, edema, pneumothorax or pleural effusion. The  cardiopericardial silhouette is within normal limits for size. Old left-sided rib fractures noted. IMPRESSION: No active cardiopulmonary disease. Electronically Signed  By: Misty Stanley M.D.  On: 03/22/2015 10:38     Assessment & Plan:   Problem List Items Addressed This Visit    Transaminitis    Intermittently mildly elevated - will recheck today.  Normal liver, mild splenomegaly by CT 2016      Relevant Orders   Comprehensive metabolic panel (Completed)   Gamma GT (Completed)   Primary osteoarthritis of right knee    Discussing total knee replacement with Dr Noemi Chapel      OSA (obstructive sleep apnea)    Has not tolerated CPAP in the past      Obesity, Class III, BMI 40-49.9 (morbid obesity) ([place])    Reviewed weight trends to date. Discussing possible knee replacement with Dr Noemi Chapel      Myelolipoma of right adrenal gland   HTN (hypertension)    bp stable today only on beta blocker.      Dyspnea    Recent CXR and echo stable. Ambulatory pulse ox today -  does not drop below 95%. Anticipate multifactorial predominantly due to morbid obesity (restrictive lung disease) with deconditioning and chronic dCHF but with endorsed asthma history will refer to pulm to discuss possible PFTs. Pt agrees with plan. Likely with uncontrolled OSA but unable to tolerate CPAP machine.       Relevant Orders   Ambulatory referral to Pulmonology   Diastolic CHF, chronic (HCC)    Continue lasix, beta blocker. Echo stable in hospital.       Diabetes mellitus type 2 with retinopathy ([place])    Off antihyperglycemics. Diabetes controlled with weight loss. Lab Results  Component Value Date   HGBA1C 6.3* 03/22/2015        Relevant Orders   Comprehensive metabolic panel (Completed)   Chest pain - Primary    S/p reassuring eval in hospital. Thought MSK. This has resolved. Discussed limiting heavy lifting.       CAD (coronary artery disease)    Nonobstructive by recent catheterization        Bariatric surgery status    Other Visit Diagnoses    Special screening for malignant neoplasm of prostate        Relevant Orders    PSA, Medicare (Completed)    Need for hepatitis C screening test        Relevant Orders    Hepatitis C antibody, reflex    Screening for HIV (human immunodeficiency virus)        Relevant Orders    HIV antibody (Completed)        Follow up plan: Return as needed, for medicare wellness visit.

## 2015-04-03 NOTE — Assessment & Plan Note (Signed)
Nonobstructive by recent catheterization

## 2015-04-03 NOTE — Progress Notes (Signed)
Pre visit review using our clinic review tool, if applicable. No additional management support is needed unless otherwise documented below in the visit note. 

## 2015-04-03 NOTE — Assessment & Plan Note (Signed)
Continue lasix, beta blocker. Echo stable in hospital.

## 2015-04-04 ENCOUNTER — Encounter: Payer: Self-pay | Admitting: Family Medicine

## 2015-04-04 DIAGNOSIS — D1779 Benign lipomatous neoplasm of other sites: Secondary | ICD-10-CM | POA: Insufficient documentation

## 2015-04-04 LAB — HIV ANTIBODY (ROUTINE TESTING W REFLEX): HIV: NONREACTIVE

## 2015-04-04 LAB — HEPATITIS C ANTIBODY: HCV AB: NEGATIVE

## 2015-04-04 NOTE — Assessment & Plan Note (Signed)
Off antihyperglycemics. Diabetes controlled with weight loss. Lab Results  Component Value Date   HGBA1C 6.3* 03/22/2015

## 2015-04-04 NOTE — Assessment & Plan Note (Signed)
Has not tolerated CPAP in the past

## 2015-04-04 NOTE — Assessment & Plan Note (Addendum)
Reviewed weight trends to date. Discussing possible knee replacement with Dr Noemi Chapel

## 2015-04-04 NOTE — Assessment & Plan Note (Signed)
Discussing total knee replacement with Dr Noemi Chapel

## 2015-04-04 NOTE — Assessment & Plan Note (Addendum)
Intermittently mildly elevated - will recheck today.  Normal liver, mild splenomegaly by CT 2016

## 2015-04-04 NOTE — Assessment & Plan Note (Signed)
bp stable today only on beta blocker.

## 2015-04-06 ENCOUNTER — Other Ambulatory Visit: Payer: Self-pay | Admitting: Cardiovascular Disease

## 2015-04-06 ENCOUNTER — Ambulatory Visit: Payer: Medicare Other | Admitting: Family Medicine

## 2015-04-07 ENCOUNTER — Other Ambulatory Visit: Payer: Self-pay | Admitting: *Deleted

## 2015-04-07 ENCOUNTER — Encounter: Payer: Medicare Other | Admitting: Nurse Practitioner

## 2015-04-07 MED ORDER — HYDROCODONE-ACETAMINOPHEN 10-325 MG PO TABS: 1.0000 | ORAL_TABLET | Freq: Three times a day (TID) | ORAL | Status: DC | PRN

## 2015-04-07 NOTE — Telephone Encounter (Signed)
Last filled #60 on 03/09/15.  Last OV 04/03/15.  Okay to refill?

## 2015-04-07 NOTE — Telephone Encounter (Signed)
Printed and in kims'box

## 2015-04-08 NOTE — Telephone Encounter (Signed)
Patient notified and Rx placed up front for pick up.

## 2015-04-18 ENCOUNTER — Encounter: Payer: Self-pay | Admitting: Family Medicine

## 2015-04-20 ENCOUNTER — Other Ambulatory Visit: Payer: Self-pay | Admitting: Family Medicine

## 2015-04-20 ENCOUNTER — Other Ambulatory Visit: Payer: Self-pay | Admitting: Cardiovascular Disease

## 2015-04-23 ENCOUNTER — Ambulatory Visit: Payer: Medicare Other | Admitting: Pulmonary Disease

## 2015-04-23 DIAGNOSIS — S83281A Other tear of lateral meniscus, current injury, right knee, initial encounter: Secondary | ICD-10-CM | POA: Diagnosis not present

## 2015-04-23 DIAGNOSIS — M1711 Unilateral primary osteoarthritis, right knee: Secondary | ICD-10-CM | POA: Diagnosis not present

## 2015-04-24 ENCOUNTER — Other Ambulatory Visit: Payer: Medicare Other

## 2015-04-27 DIAGNOSIS — M25561 Pain in right knee: Secondary | ICD-10-CM | POA: Diagnosis not present

## 2015-04-28 ENCOUNTER — Encounter: Payer: Medicare Other | Admitting: Family Medicine

## 2015-04-28 ENCOUNTER — Ambulatory Visit (INDEPENDENT_AMBULATORY_CARE_PROVIDER_SITE_OTHER): Payer: Medicare Other | Admitting: Family Medicine

## 2015-04-28 ENCOUNTER — Encounter: Payer: Self-pay | Admitting: Family Medicine

## 2015-04-28 VITALS — BP 148/90 | HR 59 | Temp 97.8°F | Ht 69.0 in | Wt 315.0 lb

## 2015-04-28 DIAGNOSIS — Z0000 Encounter for general adult medical examination without abnormal findings: Secondary | ICD-10-CM | POA: Diagnosis not present

## 2015-04-28 DIAGNOSIS — I2510 Atherosclerotic heart disease of native coronary artery without angina pectoris: Secondary | ICD-10-CM

## 2015-04-28 DIAGNOSIS — I10 Essential (primary) hypertension: Secondary | ICD-10-CM

## 2015-04-28 DIAGNOSIS — Z7189 Other specified counseling: Secondary | ICD-10-CM

## 2015-04-28 DIAGNOSIS — R892 Abnormal level of other drugs, medicaments and biological substances in specimens from other organs, systems and tissues: Secondary | ICD-10-CM

## 2015-04-28 DIAGNOSIS — E785 Hyperlipidemia, unspecified: Secondary | ICD-10-CM | POA: Diagnosis not present

## 2015-04-28 DIAGNOSIS — R740 Nonspecific elevation of levels of transaminase and lactic acid dehydrogenase [LDH]: Secondary | ICD-10-CM

## 2015-04-28 DIAGNOSIS — E113553 Type 2 diabetes mellitus with stable proliferative diabetic retinopathy, bilateral: Secondary | ICD-10-CM

## 2015-04-28 DIAGNOSIS — R7401 Elevation of levels of liver transaminase levels: Secondary | ICD-10-CM

## 2015-04-28 DIAGNOSIS — R1011 Right upper quadrant pain: Secondary | ICD-10-CM

## 2015-04-28 NOTE — Assessment & Plan Note (Addendum)
Off antihyperglycemics. Due for eye exam and foot exam - next visit. Last A1c well controlled.

## 2015-04-28 NOTE — Assessment & Plan Note (Signed)
Update abd [place]. Prior CT with normal liver but mild splenomegaly. LFTs stable.

## 2015-04-28 NOTE — Assessment & Plan Note (Signed)
Weight gain noted. Activity limited by arthritis pain of knee.

## 2015-04-28 NOTE — Patient Instructions (Addendum)
Call your insurance about the shingles shot to see if it is covered or how much it would cost and where is cheaper (here or pharmacy).  If you want to receive here, call for nurse visit.  Double check with [name] Pulse about advanced directive packet - fill out and bring [place] a copy. We will schedule abdominal ultrasound to check liver.  We will check more frequent urine drug screens as last test was abnormal.  Return in 3 months for diabetes follow up.  Health Maintenance, Male A healthy lifestyle and preventative care can promote health and wellness.  Maintain regular health, dental, and eye exams.  Eat a healthy diet. Foods like vegetables, fruits, whole grains, low-fat dairy products, and lean protein foods contain the nutrients you need and are low in calories. Decrease your intake of foods high in solid fats, added sugars, and salt. Get information about a proper diet from your health care provider, if necessary.  Regular physical exercise is one of the most important things you can do for your health. Most adults should get at least 150 minutes of moderate-intensity exercise (any activity that increases your heart rate and causes you to sweat) each week. In addition, most adults need muscle-strengthening exercises on 2 or more days a week.   Maintain a healthy weight. The body mass index (BMI) is a screening tool to identify possible weight problems. It provides an estimate of body fat based on height and weight. Your health care provider can find your BMI and can help you achieve or maintain a healthy weight. For males 20 years and older:  A BMI below 18.5 is considered underweight.  A BMI of 18.5 to 24.9 is normal.  A BMI of 25 to 29.9 is considered overweight.  A BMI of 30 and above is considered obese.  Maintain normal blood lipids and cholesterol by exercising and minimizing your intake of saturated fat. Eat a balanced diet with plenty of fruits and vegetables. Blood tests for lipids and  cholesterol should begin at age 52 and be repeated every 5 years. If your lipid or cholesterol levels are high, you are over age 81, or you are at high risk for heart disease, you may need your cholesterol levels checked more frequently.Ongoing high lipid and cholesterol levels should be treated with medicines if diet and exercise are not working.  If you smoke, find out from your health care provider how to quit. If you do not use tobacco, do not start.  Lung cancer screening is recommended for adults aged 68-80 years who are at high risk for developing lung cancer because of a history of smoking. A yearly low-dose CT scan of the lungs is recommended for people who have at least a 30-pack-year history of smoking and are current smokers or have quit within the past 15 years. A pack year of smoking is smoking an average of 1 pack of cigarettes a day for 1 year (for example, a 30-pack-year history of smoking could mean smoking 1 pack a day for 30 years or 2 packs a day for 15 years). Yearly screening should continue until the smoker has stopped smoking for at least 15 years. Yearly screening should be stopped for people who develop a health problem that would prevent them from having lung cancer treatment.  If you choose to drink alcohol, do not have more than 2 drinks per day. One drink is considered to be 12 oz (360 mL) of beer, 5 oz (150 mL) of wine, or  1.5 oz (45 mL) of liquor.  Avoid the use of street drugs. Do not share needles with anyone. Ask for help if you need support or instructions about stopping the use of drugs.  High blood pressure causes heart disease and increases the risk of stroke. High blood pressure is more likely to develop in:  People who have blood pressure in the end of the normal range (100-139/85-89 mm Hg).  People who are overweight or obese.  People who are African American.  If you are 81-50 years of age, have your blood pressure checked every 3-5 years. If you are 76  years of age or older, have your blood pressure checked every year. You should have your blood pressure measured twice--once when you are at a hospital or clinic, and once when you are not at a hospital or clinic. Record the average of the two measurements. To check your blood pressure when you are not at a hospital or clinic, you can use:  An automated blood pressure machine at a pharmacy.  A home blood pressure monitor.  If you are 59-85 years old, ask your health care provider if you should take aspirin to prevent heart disease.  Diabetes screening involves taking a blood sample to check your fasting blood sugar level. This should be done once every 3 years after age 10 if you are at a normal weight and without risk factors for diabetes. Testing should be considered at a younger age or be carried out more frequently if you are overweight and have at least 1 risk factor for diabetes.  Colorectal cancer can be detected and often prevented. Most routine colorectal cancer screening begins at the age of 61 and continues through age 66. However, your health care provider may recommend screening at an earlier age if you have risk factors for colon cancer. On a yearly basis, your health care provider may provide home test kits to check for hidden blood in the stool. A small camera at the end of a tube may be used to directly examine the colon (sigmoidoscopy or colonoscopy) to detect the earliest forms of colorectal cancer. Talk to your health care provider about this at age 38 when routine screening begins. A direct exam of the colon should be repeated every 5-10 years through age 51, unless early forms of precancerous polyps or small growths are found.  People who are at an increased risk for hepatitis B should be screened for this virus. You are considered at high risk for hepatitis B if:  You were born in a country where hepatitis B occurs often. Talk with your health care provider about which countries  are considered high risk.  Your parents were born in a high-risk country and you have not received a shot to protect against hepatitis B (hepatitis B vaccine).  You have HIV or AIDS.  You use needles to inject street drugs.  You live with, or have sex with, someone who has hepatitis B.  You are a man who has sex with other men (MSM).  You get hemodialysis treatment.  You take certain medicines for conditions like cancer, organ transplantation, and autoimmune conditions.  Hepatitis C blood testing is recommended for all people born from [birth year] through 1965 and any individual with known risk factors for hepatitis C.  Healthy men should no longer receive prostate-specific antigen (PSA) blood tests as part of routine cancer screening. Talk to your health care provider about prostate cancer screening.  Testicular cancer screening is not  recommended for adolescents or adult males who have no symptoms. Screening includes self-exam, a health care provider exam, and other screening tests. Consult with your health care provider about any symptoms you have or any concerns you have about testicular cancer.  Practice safe sex. Use condoms and avoid high-risk sexual practices to reduce the spread of sexually transmitted infections (STIs).  You should be screened for STIs, including gonorrhea and chlamydia if:  You are sexually active and are younger than 24 years.  You are older than 24 years, and your health care provider tells you that you are at risk for this type of infection.  Your sexual activity has changed since you were last screened, and you are at an increased risk for chlamydia or gonorrhea. Ask your health care provider if you are at risk.  If you are at risk of being infected with HIV, it is recommended that you take a prescription medicine daily to prevent HIV infection. This is called pre-exposure prophylaxis (PrEP). You are considered at risk if:  You are a man who has sex with  other men (MSM).  You are a heterosexual man who is sexually active with multiple partners.  You take drugs by injection.  You are sexually active with a partner who has HIV.  Talk with your health care provider about whether you are at high risk of being infected with HIV. If you choose to begin PrEP, you should first be tested for HIV. You should then be tested every 3 months for as long as you are taking PrEP.  Use sunscreen. Apply sunscreen liberally and repeatedly throughout the day. You should seek shade when your shadow is shorter than you. Protect yourself by wearing long sleeves, pants, a wide-brimmed hat, and sunglasses year round whenever you are outdoors.  Tell your health care provider of new moles or changes in moles, especially if there is a change in shape or color. Also, tell your health care provider if a mole is larger than the size of a pencil eraser.  A one-time screening for abdominal aortic aneurysm (AAA) and surgical repair of large AAAs by ultrasound is recommended for men aged 49-75 years who are current or former smokers.  Stay current with your vaccines (immunizations).   This information is not intended to replace advice given to you by your health care provider. Make sure you discuss any questions you have with your health care provider.   Document Released: 08/06/2007 Document Revised: 02/28/2014 Document Reviewed: 07/05/2010 Elsevier Interactive Patient Education [employer].

## 2015-04-28 NOTE — Progress Notes (Signed)
Pre visit review using our clinic review tool, if applicable. No additional management support is needed unless otherwise documented below in the visit note. 

## 2015-04-28 NOTE — Assessment & Plan Note (Signed)
Discussed repeated abnormals. Declines diverting or sharing meds. Will screen Q2-3 months and if remaining abnormal, discussed will ned to stop prescribing controlled substances. Pt expresses understanding.

## 2015-04-28 NOTE — Assessment & Plan Note (Signed)

## 2015-04-28 NOTE — Progress Notes (Signed)
BP 148/90 mmHg  Pulse 59  Temp(Src) 97.8 F (36.6 C) (Oral)  Ht 5' 9"  (1.753 m)  Wt 315 lb (142.883 kg)  BMI 46.50 kg/m2  SpO2 97%   CC: medicare wellness visit  Subjective:    Patient ID: Chad [name], male    DOB: [birth year], 60 y.o.   MRN: [id number]  HPI: Chad [name] is a 60 y.o. male presenting on 04/28/2015 for Annual Exam   Knee MRI yesterday by Dr Noemi Chapel. Using brace. Endorses hydrocodone use TID. Sinus headache today.  Lab Results  Component Value Date   HGBA1C 6.3* 03/22/2015     Staying active walking 1 mi/day although recommended against this by ortho.   Failed hearing screen. Due for f/u with audiologist. Financial constraints.  Denies depression or anhedonia.  No falls.  Preventative: COLONOSCOPY Date: 12/2014 TAs, mod diverticulosis, rpt 3 yrs (Pyrtle) Prostate screen yearly.  Flu shot yearly Tetanus shot - 01/2011  Pneumovax 08/2011 zostavax - not interested in this due to finances Discussed advanced directives, doesn't think would want prolonged life support, but will think about this.Would want wife to be HCPOA. Packet provided last year. Seat belt use discussed Sunscreen use discussed. No changing moles on skin.  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 Stays away from fried foods.  Relevant past medical, surgical, family and social history reviewed and updated as indicated. Interim medical history since our last visit reviewed. Allergies and medications reviewed and updated. Current Outpatient Prescriptions on File Prior to Visit  Medication Sig  . albuterol (PROVENTIL HFA;VENTOLIN HFA) 108 (90 BASE) MCG/ACT inhaler Inhale 2 puffs into the lungs every 6 (six) hours as needed for wheezing or shortness of breath.  Marland Kitchen aspirin EC 81 MG tablet Take 162 mg by mouth daily.     Marland Kitchen docusate sodium 100 MG CAPS Take 100 mg by mouth daily as needed for mild constipation.  . furosemide (LASIX) 20 MG tablet TAKE 1 TABLET BY MOUTH TWICE A DAY AS NEEDED  . HYDROcodone-acetaminophen (NORCO) 10-325 MG tablet Take 1 tablet by mouth 3 (three) times daily as needed for moderate pain.  Marland Kitchen levothyroxine (SYNTHROID, LEVOTHROID) 100 MCG tablet TAKE 1 TABLET BY MOUTH ONCE A DAY  . metoCLOPramide (REGLAN) 10 MG tablet TAKE 1 TABLET BY MOUTH 3 TIMES DAILY BEFORE MEALS  . metoprolol tartrate (LOPRESSOR) 25 MG tablet TAKE 1 TABLET BY MOUTH TWICE A DAY  . Multiple Vitamin (MULITIVITAMIN WITH MINERALS) TABS Take 1 tablet by mouth daily.  Marland Kitchen NEXIUM 40 MG capsule TAKE 1 CAPSULE BY MOUTH TWICE DAILY  . nitroGLYCERIN (NITROSTAT) 0.4 MG SL tablet Place 1 tablet (0.4 mg total) under the tongue every 5 (five) minutes as needed for chest pain.  . potassium chloride (K-DUR) 10 MEQ tablet TAKE 1 TABLET BY MOUTH TWICE A DAY AS NEEDED  . Red Yeast Rice 600 MG CAPS Take 1 capsule by mouth 2 (two) times daily.   . traMADol (ULTRAM) 50 MG tablet TAKE 1 TABLET BY MOUTH TWICE A DAY AS NEEDED (Patient taking differently: TAKE 1 TABLET BY MOUTH TWICE A DAY AS NEEDED for pain)   No current facility-administered medications on file prior to visit.    Review of Systems Per HPI unless specifically indicated in ROS section     Objective:    BP 148/90 mmHg  Pulse 59  Temp(Src) 97.8 F (36.6 C) (Oral)  Ht 5' 9"  (1.753 m)  Wt 315 lb (142.883 kg)  BMI 46.50 kg/m2  SpO2 97%  Wt Readings from Last 3 Encounters:  04/28/15 315 lb (142.883 kg)  04/03/15 309 lb (140.161 kg)  03/24/15 296 lb 12.8 oz (134.628 kg)    Physical Exam  Constitutional: He is oriented to person, place, and time. He appears well-developed and well-nourished. No distress.  HENT:  Head: Normocephalic and atraumatic.  Right Ear: External ear and ear canal normal. Decreased hearing is noted.  Left Ear: Hearing, tympanic membrane,  external ear and ear canal normal.  Nose: Nose normal.  Mouth/Throat: Uvula is midline, oropharynx is clear and moist and mucous membranes are normal. No oropharyngeal exudate, posterior oropharyngeal edema or posterior oropharyngeal erythema.  R TM scarring  Eyes: Conjunctivae and EOM are normal. Pupils are equal, round, and reactive to light. No scleral icterus.  Neck: Normal range of motion. Neck supple. Carotid bruit is not present. No thyromegaly present.  Cardiovascular: Normal rate, regular rhythm, normal heart sounds and intact distal pulses.   No murmur heard. Pulses:      Radial pulses are 2+ on the right side, and 2+ on the left side.  Pulmonary/Chest: Effort normal and breath sounds normal. No respiratory distress. He has no wheezes. He has no rales.  Abdominal: Soft. Bowel sounds are normal. He exhibits no distension and no mass. There is no tenderness. There is no rebound and no guarding.  Genitourinary: Rectum normal. Rectal exam shows no external hemorrhoid, no internal hemorrhoid, no fissure, no mass, no tenderness and anal tone normal. Prostate is enlarged (slightly R lobe 25gm). Prostate is not tender.  Musculoskeletal: Normal range of motion. He exhibits no edema.  Lymphadenopathy:    He has no cervical adenopathy.  Neurological: He is alert and oriented to person, place, and time.  CN grossly intact, station and gait intact Recall 3/3 calculation 3/5 serial 3s  Skin: Skin is warm and dry. No rash noted.  Psychiatric: He has a normal mood and affect. His behavior is normal. Judgment and thought content normal.  Nursing note and vitals reviewed.  Results for orders placed or performed in visit on 04/03/15  Comprehensive metabolic panel  Result Value Ref Range   Sodium 138 135 - 145 mEq/L   Potassium 3.7 3.5 - 5.1 mEq/L   Chloride 101 96 - 112 mEq/L   CO2 30 19 - 32 mEq/L   Glucose, Bld 158 (H) 70 - 99 mg/dL   BUN 14 6 - 23 mg/dL   Creatinine, Ser 0.94 0.40 - 1.50  mg/dL   Total Bilirubin 0.8 0.2 - 1.2 mg/dL   Alkaline Phosphatase 137 (H) 39 - 117 U/L   AST 29 0 - 37 U/L   ALT 27 0 - 53 U/L   Total Protein 6.2 6.0 - 8.3 g/dL   Albumin 4.0 3.5 - 5.2 g/dL   Calcium 9.2 8.4 - 10.5 mg/dL   GFR 86.98 >60.00 mL/min  Gamma GT  Result Value Ref Range   GGT 163 (H) 7 - 51 U/L  PSA, Medicare  Result Value Ref Range   PSA 1.44 0.10 - 4.00 ng/ml  HIV antibody  Result Value Ref Range   HIV 1&2 Ab, 4th Generation NONREACTIVE NONREACTIVE   Lab Results  Component Value Date   TSH 1.888 03/22/2015       Assessment & Plan:   Problem List Items Addressed This Visit  Transaminitis    Update abd [place]. Prior CT with normal liver but mild splenomegaly. LFTs stable.      Relevant Orders   US Abdomen Complete   Obesity, Class III, BMI 40-49.9 (morbid obesity) (HCC)    Weight gain noted. Activity limited by arthritis pain of knee.       Medicare annual wellness visit, subsequent - Primary    I have personally reviewed the Medicare Annual Wellness questionnaire and have noted 1. The patient's medical and social history 2. Their use of alcohol, tobacco or illicit drugs 3. Their current medications and supplements 4. The patient's functional ability including ADL's, fall risks, home safety risks and hearing or visual impairment. Cognitive function has been assessed and addressed as indicated.  5. Diet and physical activity 6. Evidence for depression or mood disorders The patients weight, height, BMI have been recorded in the chart. I have made referrals, counseling and provided education to the patient based on review of the above and I have provided the pt with a written personalized care plan for preventive services. Provider list updated.. See scanned questionairre as needed for further documentation. Reviewed preventative protocols and updated unless pt declined.       HTN (hypertension)    Chronic, mildly elevated. Only on beta blocker, HR limits  increase. Consider addition of ACEI if remains elevated next visit.      Relevant Medications   omega-3 acid ethyl esters (LOVAZA) 1 g capsule   HLD (hyperlipidemia)    Continue RYR and fish oil.      Relevant Medications   omega-3 acid ethyl esters (LOVAZA) 1 g capsule   Diabetes mellitus type 2 with retinopathy ([place])    Off antihyperglycemics. Due for eye exam and foot exam - next visit. Last A1c well controlled.       Advanced care planning/counseling discussion    Discussed advanced directives, doesn't think would want prolonged life support, but will think about this.Would want wife to be HCPOA. Packet provided last year.      Abnormal drug screen    Discussed repeated abnormals. Declines diverting or sharing meds. Will screen Q2-3 months and if remaining abnormal, discussed will ned to stop prescribing controlled substances. Pt expresses understanding.        Other Visit Diagnoses    RUQ abdominal pain        Relevant Orders    US Abdomen Complete        Follow up plan: Return in about 3 months (around 07/29/2015), or needed, for follow up visit.

## 2015-04-28 NOTE — Assessment & Plan Note (Signed)
Continue RYR and fish oil.

## 2015-04-28 NOTE — Assessment & Plan Note (Signed)
Discussed advanced directives, doesn't think would want prolonged life support, but will think about this.Would want wife to be HCPOA. Packet provided last year.

## 2015-04-28 NOTE — Assessment & Plan Note (Signed)
Chronic, mildly elevated. Only on beta blocker, HR limits increase. Consider addition of ACEI if remains elevated next visit.

## 2015-04-30 DIAGNOSIS — M1711 Unilateral primary osteoarthritis, right knee: Secondary | ICD-10-CM | POA: Diagnosis not present

## 2015-05-01 ENCOUNTER — Telehealth: Payer: Self-pay | Admitting: Cardiovascular Disease

## 2015-05-01 ENCOUNTER — Telehealth: Payer: Self-pay | Admitting: *Deleted

## 2015-05-01 NOTE — Telephone Encounter (Signed)
pts wife contacted office and states that he was seen by Raliegh Ip on 3/9, and was advised he will need a R total knee replacement. Wife wanted to provide FYI as they will be sending surgical clearance paperwork for completion. She is requesting a call back when paperwork has been completed as they will not schedule surgery until it has been received.

## 2015-05-01 NOTE — Telephone Encounter (Signed)
Pt wife called, state pt needs clearance for knee surgery by Dr. Noemi Chapel.  States dr. Is faxing form.

## 2015-05-01 NOTE — Telephone Encounter (Signed)
Will await request.

## 2015-05-02 ENCOUNTER — Encounter: Payer: Self-pay | Admitting: Family Medicine

## 2015-05-02 NOTE — Telephone Encounter (Addendum)
Pending pulm eval for dyspnea scheduled at end of this month (referred last month after CP/dyspnea hospitalization with stable catheterization).  Recommend see them prior to surgery. Form filled out and placed in my out box.  plz notify patient I want him to see Dr Alva Garnet prior to surgery. Likely untreated OSA, cannot tolerate CPAP.

## 2015-05-04 ENCOUNTER — Ambulatory Visit
Admission: RE | Admit: 2015-05-04 | Discharge: 2015-05-04 | Disposition: A | Discharge status: Home / Self Care | Payer: Medicare Other | Source: Ambulatory Visit | Attending: Family Medicine | Admitting: Family Medicine

## 2015-05-04 DIAGNOSIS — R161 Splenomegaly, not elsewhere classified: Secondary | ICD-10-CM | POA: Insufficient documentation

## 2015-05-04 DIAGNOSIS — Z9049 Acquired absence of other specified parts of digestive tract: Secondary | ICD-10-CM | POA: Diagnosis not present

## 2015-05-04 DIAGNOSIS — R7401 Elevation of levels of liver transaminase levels: Secondary | ICD-10-CM

## 2015-05-04 DIAGNOSIS — R1011 Right upper quadrant pain: Secondary | ICD-10-CM | POA: Diagnosis not present

## 2015-05-04 DIAGNOSIS — R740 Nonspecific elevation of levels of transaminase and lactic acid dehydrogenase [LDH]: Secondary | ICD-10-CM | POA: Diagnosis not present

## 2015-05-05 ENCOUNTER — Other Ambulatory Visit: Payer: Self-pay

## 2015-05-05 NOTE — Telephone Encounter (Signed)
Pt left v/m requesting rx hydrocodone apap. Call when ready for pick up. rx last printed # [phone] on 04/07/15; last annual exam on 04/28/15.

## 2015-05-05 NOTE — Telephone Encounter (Signed)
Received cardiac clearance request from [place] for pt to proceed w/ Rt TKR, pending clearance. Clearance placed on Dr. Donivan Scull desk for review.

## 2015-05-05 NOTE — Telephone Encounter (Signed)
Form faxed and message left notifying patient to schedule appt to see Dr. Alva Garnet prior to surgery.

## 2015-05-06 NOTE — Telephone Encounter (Signed)
Faxed to [phone], Attn: Sherri.

## 2015-05-06 NOTE — Telephone Encounter (Signed)
Acceptable risk for surgery Should be able to stay on low-dose aspirin through the surgical procedure

## 2015-05-07 ENCOUNTER — Encounter: Payer: Self-pay | Admitting: Family Medicine

## 2015-05-07 DIAGNOSIS — R161 Splenomegaly, not elsewhere classified: Secondary | ICD-10-CM | POA: Insufficient documentation

## 2015-05-07 MED ORDER — HYDROCODONE-ACETAMINOPHEN 10-325 MG PO TABS: 1.0000 | ORAL_TABLET | Freq: Three times a day (TID) | ORAL | Status: DC | PRN

## 2015-05-07 NOTE — Telephone Encounter (Signed)
Printed and in Kim's box.

## 2015-05-07 NOTE — Telephone Encounter (Signed)
Patient notified and Rx placed up front for pick up.

## 2015-05-15 ENCOUNTER — Telehealth: Payer: Self-pay | Admitting: Family Medicine

## 2015-05-15 NOTE — Telephone Encounter (Signed)
LM for pt to sch AWV with Lesia on 6/12 at 8:15. mn

## 2015-05-15 NOTE — Telephone Encounter (Signed)
Patient already had a wellness exam with Dr.Gutierrez the beginning of this month.

## 2015-05-18 ENCOUNTER — Other Ambulatory Visit: Payer: Self-pay | Admitting: Family Medicine

## 2015-05-19 ENCOUNTER — Encounter: Payer: Self-pay | Admitting: Pulmonary Disease

## 2015-05-19 ENCOUNTER — Ambulatory Visit (INDEPENDENT_AMBULATORY_CARE_PROVIDER_SITE_OTHER): Payer: Medicare Other | Admitting: Pulmonary Disease

## 2015-05-19 VITALS — BP 148/86 | HR 69 | Ht 69.0 in | Wt 323.0 lb

## 2015-05-19 DIAGNOSIS — R0600 Dyspnea, unspecified: Secondary | ICD-10-CM | POA: Diagnosis not present

## 2015-05-19 DIAGNOSIS — J4520 Mild intermittent asthma, uncomplicated: Secondary | ICD-10-CM

## 2015-05-19 DIAGNOSIS — Z01818 Encounter for other preprocedural examination: Secondary | ICD-10-CM

## 2015-05-19 DIAGNOSIS — I2510 Atherosclerotic heart disease of native coronary artery without angina pectoris: Secondary | ICD-10-CM | POA: Diagnosis not present
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
## 2015-05-27 DIAGNOSIS — M545 Low back pain: Secondary | ICD-10-CM | POA: Diagnosis not present

## 2015-05-27 DIAGNOSIS — M25551 Pain in right hip: Secondary | ICD-10-CM | POA: Diagnosis not present

## 2015-05-28 NOTE — Pre-Procedure Instructions (Signed)
    Chad [name]  05/28/2015      [facility] - [place], [place] - [address] [name] [address] Phone: [phone] Fax: [phone]    Your procedure is scheduled on Monday, April 17th, 2017.   Report to [facility] Admitting at 8:15 A.M.   Call this number if you have problems the morning of surgery:  [phone]   Remember:   Do not eat food or drink liquids after midnight.   Take these medicines the morning of surgery with A SIP OF WATER: Albuterol inhaler if needed (please bring with you), Hydrocodone-acetaminophen (Norco) if needed, Levothyroxine (Synthroid), Metoclopramide (Reglan), Metoprolol Tartrate (Lopressor), Nexium, Tramadol (Ultram) if needed.  Nitroglycerin if needed.  7 days prior to surgery, stop taking: Aspirin, NSAIDS, Aleve, Naproxen, Ibuprofen, Advil, Motrin, BC's, Goody's, Fish oil, all herbal medications, and all vitamins.    Do not wear jewelry.  Do not wear lotions, powders, or colognes.   Men may shave face and neck.  Do not bring valuables to the hospital .   [facility] is not responsible for any belongings or valuables.  Contacts, dentures or bridgework may not be worn into surgery.  Leave your suitcase in the car.  After surgery it may be brought to your room.  For patients admitted to the hospital, discharge time will be determined by your treatment team.  Patients discharged the day of surgery will not be allowed to drive home.   Special instructions:  See attached.   Please read over the following fact sheets that you were given. Pain Booklet, Coughing and Deep Breathing, Blood Transfusion Information, Total Joint Packet, MRSA Information and Surgical Site Infection Prevention

## 2015-05-29 ENCOUNTER — Other Ambulatory Visit (HOSPITAL_COMMUNITY): Payer: Self-pay | Admitting: *Deleted

## 2015-05-29 ENCOUNTER — Encounter (HOSPITAL_COMMUNITY): Payer: Self-pay

## 2015-05-29 ENCOUNTER — Encounter (HOSPITAL_COMMUNITY)
Admission: RE | Admit: 2015-05-29 | Discharge: 2015-05-29 | Disposition: A | Discharge status: Home / Self Care | Payer: Medicare Other | Source: Ambulatory Visit | Attending: Orthopedic Surgery | Admitting: Orthopedic Surgery

## 2015-05-29 DIAGNOSIS — K219 Gastro-esophageal reflux disease without esophagitis: Secondary | ICD-10-CM | POA: Insufficient documentation

## 2015-05-29 DIAGNOSIS — Z01812 Encounter for preprocedural laboratory examination: Secondary | ICD-10-CM | POA: Diagnosis not present

## 2015-05-29 DIAGNOSIS — Z7982 Long term (current) use of aspirin: Secondary | ICD-10-CM | POA: Insufficient documentation

## 2015-05-29 DIAGNOSIS — M1711 Unilateral primary osteoarthritis, right knee: Secondary | ICD-10-CM | POA: Diagnosis not present

## 2015-05-29 DIAGNOSIS — G4733 Obstructive sleep apnea (adult) (pediatric): Secondary | ICD-10-CM | POA: Insufficient documentation

## 2015-05-29 DIAGNOSIS — I5032 Chronic diastolic (congestive) heart failure: Secondary | ICD-10-CM | POA: Diagnosis not present

## 2015-05-29 DIAGNOSIS — Z0183 Encounter for blood typing: Secondary | ICD-10-CM | POA: Diagnosis not present

## 2015-05-29 DIAGNOSIS — Z79899 Other long term (current) drug therapy: Secondary | ICD-10-CM | POA: Insufficient documentation

## 2015-05-29 DIAGNOSIS — I2510 Atherosclerotic heart disease of native coronary artery without angina pectoris: Secondary | ICD-10-CM | POA: Diagnosis not present

## 2015-05-29 DIAGNOSIS — E11319 Type 2 diabetes mellitus with unspecified diabetic retinopathy without macular edema: Secondary | ICD-10-CM | POA: Diagnosis not present

## 2015-05-29 DIAGNOSIS — I110 Hypertensive heart disease with heart failure: Secondary | ICD-10-CM | POA: Diagnosis not present

## 2015-05-29 DIAGNOSIS — Z6841 Body Mass Index (BMI) 40.0 and over, adult: Secondary | ICD-10-CM | POA: Diagnosis not present

## 2015-05-29 DIAGNOSIS — E785 Hyperlipidemia, unspecified: Secondary | ICD-10-CM | POA: Insufficient documentation

## 2015-05-29 LAB — COMPREHENSIVE METABOLIC PANEL
ALBUMIN: 3.8 g/dL (ref 3.5–5.0)
ALK PHOS: 120 U/L (ref 38–126)
ALT: 36 U/L (ref 17–63)
AST: 37 U/L (ref 15–41)
Anion gap: 11 (ref 5–15)
BUN: 16 mg/dL (ref 6–20)
CALCIUM: 9.2 mg/dL (ref 8.9–10.3)
CHLORIDE: 102 mmol/L (ref 101–111)
CO2: 23 mmol/L (ref 22–32)
Creatinine, Ser: 1.08 mg/dL (ref 0.61–1.24)
GFR calc Af Amer: >60 mL/min (ref >60)
GFR calc non Af Amer: >60 mL/min (ref >60)
GLUCOSE: 206 mg/dL — AB (ref 65–99)
Potassium: 4 mmol/L (ref 3.5–5.1)
SODIUM: 136 mmol/L (ref 135–145)
Total Bilirubin: 1.3 mg/dL — ABNORMAL HIGH (ref 0.3–1.2)
Total Protein: 6.6 g/dL (ref 6.5–8.1)

## 2015-05-29 LAB — PROTIME-INR
INR: 1.13 (ref 0.00–1.49)
Prothrombin Time: 14.6 seconds (ref 11.6–15.2)

## 2015-05-29 LAB — CBC WITH DIFFERENTIAL/PLATELET
Basophils Absolute: 0 10*3/uL (ref 0.0–0.1)
Basophils Relative: 0 %
EOS ABS: 0.2 10*3/uL (ref 0.0–0.7)
EOS PCT: 2 %
HCT: 43.6 % (ref 39.0–52.0)
HEMOGLOBIN: 15 g/dL (ref 13.0–17.0)
LYMPHS ABS: 1 10*3/uL (ref 0.7–4.0)
Lymphocytes Relative: 15 %
MCH: 28.5 pg (ref 26.0–34.0)
MCHC: 34.4 g/dL (ref 30.0–36.0)
MCV: 82.9 fL (ref 78.0–100.0)
MONOS PCT: 9 %
Monocytes Absolute: 0.6 10*3/uL (ref 0.1–1.0)
Neutro Abs: 4.9 10*3/uL (ref 1.7–7.7)
Neutrophils Relative %: 74 %
PLATELETS: 142 10*3/uL — AB (ref 150–400)
RBC: 5.26 MIL/uL (ref 4.22–5.81)
RDW: 13.3 % (ref 11.5–15.5)
WBC: 6.6 10*3/uL (ref 4.0–10.5)

## 2015-05-29 LAB — TYPE AND SCREEN
ABO/RH(D): O POS
Antibody Screen: NEGATIVE

## 2015-05-29 LAB — SURGICAL PCR SCREEN
MRSA, PCR: NEGATIVE
Staphylococcus aureus: NEGATIVE

## 2015-05-29 LAB — ABO/RH: ABO/RH(D): O POS

## 2015-05-29 LAB — APTT: aPTT: 30 seconds (ref 24–37)

## 2015-05-30 LAB — URINE CULTURE

## 2015-06-03 DIAGNOSIS — M7061 Trochanteric bursitis, right hip: Secondary | ICD-10-CM | POA: Diagnosis not present
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
## 2015-06-05 MED ORDER — LACTATED RINGERS IV SOLN
INTRAVENOUS | Status: DC
  Administered 2015-06-08: 09:00:00 via INTRAVENOUS

## 2015-06-05 MED ORDER — DEXTROSE 5 % IV SOLN
3.0000 g | INTRAVENOUS | Status: AC
  Administered 2015-06-08: 3 g via INTRAVENOUS
  Filled 2015-06-05: qty 3000

## 2015-06-08 ENCOUNTER — Inpatient Hospital Stay (HOSPITAL_COMMUNITY): Payer: Medicare Other | Admitting: Anesthesiology

## 2015-06-08 ENCOUNTER — Inpatient Hospital Stay (HOSPITAL_COMMUNITY)
Admission: RE | Admit: 2015-06-08 | Discharge: 2015-06-11 | DRG: 470 | Disposition: A | Discharge status: Home Health | Payer: Medicare Other | Source: Ambulatory Visit | Attending: Orthopedic Surgery | Admitting: Orthopedic Surgery

## 2015-06-08 ENCOUNTER — Encounter (HOSPITAL_COMMUNITY)
Admission: RE | Disposition: A | Discharge status: Home Health | Payer: Self-pay | Source: Ambulatory Visit | Attending: Orthopedic Surgery

## 2015-06-08 ENCOUNTER — Encounter (HOSPITAL_COMMUNITY): Payer: Self-pay | Admitting: *Deleted

## 2015-06-08 DIAGNOSIS — E039 Hypothyroidism, unspecified: Secondary | ICD-10-CM | POA: Diagnosis present

## 2015-06-08 DIAGNOSIS — Z9842 Cataract extraction status, left eye: Secondary | ICD-10-CM | POA: Diagnosis not present

## 2015-06-08 DIAGNOSIS — I440 Atrioventricular block, first degree: Secondary | ICD-10-CM | POA: Diagnosis present

## 2015-06-08 DIAGNOSIS — M179 Osteoarthritis of knee, unspecified: Secondary | ICD-10-CM | POA: Diagnosis not present

## 2015-06-08 DIAGNOSIS — I499 Cardiac arrhythmia, unspecified: Secondary | ICD-10-CM | POA: Diagnosis present

## 2015-06-08 DIAGNOSIS — I5032 Chronic diastolic (congestive) heart failure: Secondary | ICD-10-CM | POA: Diagnosis present

## 2015-06-08 DIAGNOSIS — G4733 Obstructive sleep apnea (adult) (pediatric): Secondary | ICD-10-CM | POA: Diagnosis present

## 2015-06-08 DIAGNOSIS — Z9841 Cataract extraction status, right eye: Secondary | ICD-10-CM

## 2015-06-08 DIAGNOSIS — Z9884 Bariatric surgery status: Secondary | ICD-10-CM | POA: Diagnosis not present

## 2015-06-08 DIAGNOSIS — I10 Essential (primary) hypertension: Secondary | ICD-10-CM | POA: Diagnosis present

## 2015-06-08 DIAGNOSIS — K3184 Gastroparesis: Secondary | ICD-10-CM | POA: Diagnosis present

## 2015-06-08 DIAGNOSIS — I110 Hypertensive heart disease with heart failure: Secondary | ICD-10-CM | POA: Diagnosis present

## 2015-06-08 DIAGNOSIS — M1711 Unilateral primary osteoarthritis, right knee: Secondary | ICD-10-CM | POA: Diagnosis not present

## 2015-06-08 DIAGNOSIS — I2510 Atherosclerotic heart disease of native coronary artery without angina pectoris: Secondary | ICD-10-CM | POA: Diagnosis present

## 2015-06-08 DIAGNOSIS — E11319 Type 2 diabetes mellitus with unspecified diabetic retinopathy without macular edema: Secondary | ICD-10-CM | POA: Diagnosis present

## 2015-06-08 DIAGNOSIS — E662 Morbid (severe) obesity with alveolar hypoventilation: Secondary | ICD-10-CM | POA: Diagnosis present

## 2015-06-08 DIAGNOSIS — E1165 Type 2 diabetes mellitus with hyperglycemia: Secondary | ICD-10-CM | POA: Diagnosis present

## 2015-06-08 DIAGNOSIS — E1143 Type 2 diabetes mellitus with diabetic autonomic (poly)neuropathy: Secondary | ICD-10-CM | POA: Diagnosis present

## 2015-06-08 DIAGNOSIS — E785 Hyperlipidemia, unspecified: Secondary | ICD-10-CM | POA: Diagnosis present

## 2015-06-08 DIAGNOSIS — E1169 Type 2 diabetes mellitus with other specified complication: Secondary | ICD-10-CM | POA: Diagnosis present

## 2015-06-08 DIAGNOSIS — Z6841 Body Mass Index (BMI) 40.0 and over, adult: Secondary | ICD-10-CM

## 2015-06-08 DIAGNOSIS — Z7982 Long term (current) use of aspirin: Secondary | ICD-10-CM | POA: Diagnosis not present

## 2015-06-08 DIAGNOSIS — Z961 Presence of intraocular lens: Secondary | ICD-10-CM | POA: Diagnosis present

## 2015-06-08 DIAGNOSIS — H34812 Central retinal vein occlusion, left eye, with macular edema: Secondary | ICD-10-CM | POA: Diagnosis present

## 2015-06-08 DIAGNOSIS — G8918 Other acute postprocedural pain: Secondary | ICD-10-CM | POA: Diagnosis not present

## 2015-06-08 DIAGNOSIS — D696 Thrombocytopenia, unspecified: Secondary | ICD-10-CM | POA: Diagnosis not present

## 2015-06-08 DIAGNOSIS — K219 Gastro-esophageal reflux disease without esophagitis: Secondary | ICD-10-CM | POA: Diagnosis present

## 2015-06-08 DIAGNOSIS — M25561 Pain in right knee: Secondary | ICD-10-CM | POA: Diagnosis not present

## 2015-06-08 HISTORY — DX: Thrombocytopenia, unspecified: D69.6

## 2015-06-08 HISTORY — PX: TOTAL KNEE ARTHROPLASTY: SHX125

## 2015-06-08 LAB — GLUCOSE, CAPILLARY
GLUCOSE-CAPILLARY: 139 mg/dL — AB (ref 65–99)
Glucose-Capillary: 168 mg/dL — ABNORMAL HIGH (ref 65–99)

## 2015-06-08 SURGERY — ARTHROPLASTY, KNEE, TOTAL
Anesthesia: Regional | Laterality: Right

## 2015-06-08 MED ORDER — HYDROMORPHONE HCL 1 MG/ML IJ SOLN
1.0000 mg | INTRAMUSCULAR | Status: DC | PRN
  Administered 2015-06-08 – 2015-06-09 (×2): 1 mg via INTRAVENOUS
  Filled 2015-06-08 (×2): qty 1

## 2015-06-08 MED ORDER — POLYETHYLENE GLYCOL 3350 17 G PO PACK
17.0000 g | PACK | Freq: Two times a day (BID) | ORAL | Status: DC
  Administered 2015-06-08 – 2015-06-11 (×6): 17 g via ORAL
  Filled 2015-06-08 (×7): qty 1

## 2015-06-08 MED ORDER — APIXABAN 2.5 MG PO TABS: 2.5000 mg | ORAL_TABLET | Freq: Two times a day (BID) | ORAL | Status: DC

## 2015-06-08 MED ORDER — FENTANYL CITRATE (PF) 100 MCG/2ML IJ SOLN
INTRAMUSCULAR | Status: AC
Start: 2015-06-08 — End: 2015-06-08
  Filled 2015-06-08: qty 2

## 2015-06-08 MED ORDER — ONDANSETRON HCL 4 MG PO TABS: 4.0000 mg | ORAL_TABLET | Freq: Four times a day (QID) | ORAL | Status: DC | PRN

## 2015-06-08 MED ORDER — PROPOFOL 10 MG/ML IV BOLUS
INTRAVENOUS | Status: DC | PRN
  Administered 2015-06-08: 20 mg via INTRAVENOUS

## 2015-06-08 MED ORDER — OXYCODONE HCL 5 MG PO TABS
ORAL_TABLET | ORAL | Status: AC
  Filled 2015-06-08: qty 2

## 2015-06-08 MED ORDER — BUPIVACAINE-EPINEPHRINE 0.25% -1:200000 IJ SOLN
INTRAMUSCULAR | Status: DC | PRN
Start: 2015-06-08 — End: 2015-06-08
  Administered 2015-06-08: 30 mL

## 2015-06-08 MED ORDER — 0.9 % SODIUM CHLORIDE (POUR BTL) OPTIME
TOPICAL | Status: DC | PRN
  Administered 2015-06-08: 1000 mL

## 2015-06-08 MED ORDER — BUPIVACAINE-EPINEPHRINE (PF) 0.25% -1:200000 IJ SOLN
INTRAMUSCULAR | Status: AC
  Filled 2015-06-08: qty 30

## 2015-06-08 MED ORDER — ONDANSETRON HCL 4 MG/2ML IJ SOLN
INTRAMUSCULAR | Status: AC
  Filled 2015-06-08: qty 2

## 2015-06-08 MED ORDER — HYDROMORPHONE HCL 1 MG/ML IJ SOLN
INTRAMUSCULAR | Status: AC
  Administered 2015-06-08: 0.5 mg via INTRAVENOUS
  Filled 2015-06-08: qty 1

## 2015-06-08 MED ORDER — METHOCARBAMOL 500 MG PO TABS
ORAL_TABLET | ORAL | Status: AC
  Filled 2015-06-08: qty 1

## 2015-06-08 MED ORDER — ONDANSETRON HCL 4 MG/2ML IJ SOLN: 4.0000 mg | Freq: Once | INTRAMUSCULAR | Status: DC | PRN

## 2015-06-08 MED ORDER — PROPOFOL 500 MG/50ML IV EMUL
INTRAVENOUS | Status: DC | PRN
  Administered 2015-06-08: 50 ug/kg/min via INTRAVENOUS

## 2015-06-08 MED ORDER — CHLORHEXIDINE GLUCONATE 4 % EX LIQD: 60.0000 mL | Freq: Once | CUTANEOUS | Status: DC

## 2015-06-08 MED ORDER — ALBUTEROL SULFATE HFA 108 (90 BASE) MCG/ACT IN AERS
2.0000 | INHALATION_SPRAY | Freq: Four times a day (QID) | RESPIRATORY_TRACT | Status: DC | PRN
  Filled 2015-06-08: qty 6.7

## 2015-06-08 MED ORDER — OXYCODONE HCL ER 10 MG PO T12A
20.0000 mg | EXTENDED_RELEASE_TABLET | Freq: Two times a day (BID) | ORAL | Status: DC
  Administered 2015-06-08 – 2015-06-11 (×6): 20 mg via ORAL
  Filled 2015-06-08 (×6): qty 2

## 2015-06-08 MED ORDER — CEFAZOLIN SODIUM-DEXTROSE 2-4 GM/100ML-% IV SOLN
2.0000 g | Freq: Four times a day (QID) | INTRAVENOUS | Status: AC
  Administered 2015-06-09: 2 g via INTRAVENOUS
  Filled 2015-06-08 (×2): qty 100

## 2015-06-08 MED ORDER — BUPIVACAINE IN DEXTROSE 0.75-8.25 % IT SOLN
INTRATHECAL | Status: DC | PRN
  Administered 2015-06-08: 2 mL via INTRATHECAL

## 2015-06-08 MED ORDER — ALBUTEROL SULFATE (2.5 MG/3ML) 0.083% IN NEBU: 2.5000 mg | INHALATION_SOLUTION | Freq: Four times a day (QID) | RESPIRATORY_TRACT | Status: DC | PRN

## 2015-06-08 MED ORDER — OXYCODONE HCL 5 MG PO TABS
5.0000 mg | ORAL_TABLET | ORAL | Status: DC | PRN
  Administered 2015-06-08: 10 mg via ORAL

## 2015-06-08 MED ORDER — POVIDONE-IODINE 7.5 % EX SOLN: Freq: Once | CUTANEOUS | Status: DC

## 2015-06-08 MED ORDER — NITROGLYCERIN 0.4 MG SL SUBL: 0.4000 mg | SUBLINGUAL_TABLET | SUBLINGUAL | Status: DC | PRN

## 2015-06-08 MED ORDER — DEXAMETHASONE SODIUM PHOSPHATE 10 MG/ML IJ SOLN
10.0000 mg | Freq: Three times a day (TID) | INTRAMUSCULAR | Status: AC
  Administered 2015-06-09 (×3): 10 mg via INTRAVENOUS
  Filled 2015-06-08 (×3): qty 1

## 2015-06-08 MED ORDER — MIDAZOLAM HCL 2 MG/2ML IJ SOLN: 2.0000 mg | Freq: Once | INTRAMUSCULAR | Status: DC

## 2015-06-08 MED ORDER — MIDAZOLAM HCL 5 MG/5ML IJ SOLN
INTRAMUSCULAR | Status: DC | PRN
  Administered 2015-06-08 (×2): 1 mg via INTRAVENOUS

## 2015-06-08 MED ORDER — LEVOTHYROXINE SODIUM 100 MCG PO TABS
100.0000 ug | ORAL_TABLET | Freq: Every day | ORAL | Status: DC
  Administered 2015-06-09 – 2015-06-11 (×3): 100 ug via ORAL
  Filled 2015-06-08 (×2): qty 1

## 2015-06-08 MED ORDER — DIPHENHYDRAMINE HCL 12.5 MG/5ML PO ELIX: 12.5000 mg | ORAL_SOLUTION | ORAL | Status: DC | PRN

## 2015-06-08 MED ORDER — OXYCODONE HCL 5 MG PO TABS
5.0000 mg | ORAL_TABLET | ORAL | Status: DC | PRN
  Administered 2015-06-08: 15 mg via ORAL
  Administered 2015-06-08 – 2015-06-09 (×5): 10 mg via ORAL
  Administered 2015-06-10 (×2): 15 mg via ORAL
  Administered 2015-06-10: 10 mg via ORAL
  Administered 2015-06-11: 15 mg via ORAL
  Filled 2015-06-08 (×5): qty 3
  Filled 2015-06-08 (×5): qty 2

## 2015-06-08 MED ORDER — MIDAZOLAM HCL 2 MG/2ML IJ SOLN
INTRAMUSCULAR | Status: AC
  Filled 2015-06-08: qty 2

## 2015-06-08 MED ORDER — DOCUSATE SODIUM 100 MG PO CAPS
100.0000 mg | ORAL_CAPSULE | Freq: Two times a day (BID) | ORAL | Status: DC
  Administered 2015-06-08 – 2015-06-11 (×6): 100 mg via ORAL
  Filled 2015-06-08 (×6): qty 1

## 2015-06-08 MED ORDER — DEXAMETHASONE SODIUM PHOSPHATE 4 MG/ML IJ SOLN
INTRAMUSCULAR | Status: AC
  Filled 2015-06-08: qty 2

## 2015-06-08 MED ORDER — FENTANYL CITRATE (PF) 250 MCG/5ML IJ SOLN
INTRAMUSCULAR | Status: AC
  Filled 2015-06-08: qty 5

## 2015-06-08 MED ORDER — ACETAMINOPHEN 325 MG PO TABS
ORAL_TABLET | ORAL | Status: AC
  Filled 2015-06-08: qty 2

## 2015-06-08 MED ORDER — ONDANSETRON HCL 4 MG/2ML IJ SOLN
INTRAMUSCULAR | Status: DC | PRN
  Administered 2015-06-08: 4 mg via INTRAVENOUS

## 2015-06-08 MED ORDER — EPHEDRINE SULFATE 50 MG/ML IJ SOLN
INTRAMUSCULAR | Status: DC | PRN
  Administered 2015-06-08 (×2): 5 mg via INTRAVENOUS
  Administered 2015-06-08: 10 mg via INTRAVENOUS

## 2015-06-08 MED ORDER — PANTOPRAZOLE SODIUM 40 MG PO TBEC
40.0000 mg | DELAYED_RELEASE_TABLET | Freq: Every day | ORAL | Status: DC
  Administered 2015-06-08 – 2015-06-11 (×4): 40 mg via ORAL
  Filled 2015-06-08 (×4): qty 1

## 2015-06-08 MED ORDER — METOPROLOL TARTRATE 25 MG PO TABS
25.0000 mg | ORAL_TABLET | Freq: Two times a day (BID) | ORAL | Status: DC
  Administered 2015-06-09 – 2015-06-11 (×6): 25 mg via ORAL
  Filled 2015-06-08 (×6): qty 1

## 2015-06-08 MED ORDER — PHENYLEPHRINE HCL 10 MG/ML IJ SOLN
10.0000 mg | INTRAVENOUS | Status: DC | PRN
  Administered 2015-06-08: 20 ug/min via INTRAVENOUS

## 2015-06-08 MED ORDER — FENTANYL CITRATE (PF) 100 MCG/2ML IJ SOLN
50.0000 ug | Freq: Once | INTRAMUSCULAR | Status: AC
Start: 2015-06-08 — End: 2015-06-08
  Administered 2015-06-08: 50 ug via INTRAVENOUS

## 2015-06-08 MED ORDER — METOCLOPRAMIDE HCL 10 MG PO TABS
10.0000 mg | ORAL_TABLET | Freq: Three times a day (TID) | ORAL | Status: DC
  Administered 2015-06-08 – 2015-06-11 (×7): 10 mg via ORAL
  Filled 2015-06-08 (×7): qty 1

## 2015-06-08 MED ORDER — MIDAZOLAM HCL 2 MG/2ML IJ SOLN
INTRAMUSCULAR | Status: AC
  Administered 2015-06-08: 2 mg
  Filled 2015-06-08: qty 2

## 2015-06-08 MED ORDER — ALUM & MAG HYDROXIDE-SIMETH 200-200-20 MG/5ML PO SUSP: 30.0000 mL | ORAL | Status: DC | PRN

## 2015-06-08 MED ORDER — DEXAMETHASONE SODIUM PHOSPHATE 10 MG/ML IJ SOLN
INTRAMUSCULAR | Status: AC
  Filled 2015-06-08: qty 1

## 2015-06-08 MED ORDER — HYDROMORPHONE HCL 1 MG/ML IJ SOLN
0.2500 mg | INTRAMUSCULAR | Status: DC | PRN
  Administered 2015-06-08 (×4): 0.5 mg via INTRAVENOUS

## 2015-06-08 MED ORDER — LEVOTHYROXINE SODIUM 100 MCG PO TABS
100.0000 ug | ORAL_TABLET | Freq: Every day | ORAL | Status: DC
  Filled 2015-06-08 (×2): qty 1

## 2015-06-08 MED ORDER — HYDROMORPHONE HCL 1 MG/ML IJ SOLN
INTRAMUSCULAR | Status: AC
Start: 2015-06-08 — End: 2015-06-08
  Administered 2015-06-08: 0.5 mg via INTRAVENOUS
  Filled 2015-06-08: qty 1

## 2015-06-08 MED ORDER — DEXAMETHASONE SODIUM PHOSPHATE 10 MG/ML IJ SOLN
INTRAMUSCULAR | Status: DC | PRN
  Administered 2015-06-08: 10 mg via INTRAVENOUS

## 2015-06-08 MED ORDER — PHENOL 1.4 % MT LIQD: 1.0000 | OROMUCOSAL | Status: DC | PRN

## 2015-06-08 MED ORDER — ACETAMINOPHEN 325 MG PO TABS
650.0000 mg | ORAL_TABLET | Freq: Four times a day (QID) | ORAL | Status: DC | PRN
  Administered 2015-06-08: 650 mg via ORAL

## 2015-06-08 MED ORDER — ONDANSETRON HCL 4 MG/2ML IJ SOLN: 4.0000 mg | Freq: Four times a day (QID) | INTRAMUSCULAR | Status: DC | PRN

## 2015-06-08 MED ORDER — ACETAMINOPHEN 650 MG RE SUPP: 650.0000 mg | Freq: Four times a day (QID) | RECTAL | Status: DC | PRN

## 2015-06-08 MED ORDER — PROPOFOL 10 MG/ML IV BOLUS
INTRAVENOUS | Status: AC
  Filled 2015-06-08: qty 20

## 2015-06-08 MED ORDER — METOCLOPRAMIDE HCL 5 MG/ML IJ SOLN: 5.0000 mg | Freq: Three times a day (TID) | INTRAMUSCULAR | Status: DC | PRN

## 2015-06-08 MED ORDER — PROPOFOL 1000 MG/100ML IV EMUL
INTRAVENOUS | Status: AC
  Filled 2015-06-08: qty 100

## 2015-06-08 MED ORDER — LIDOCAINE HCL (CARDIAC) 20 MG/ML IV SOLN
INTRAVENOUS | Status: AC
  Filled 2015-06-08: qty 5

## 2015-06-08 MED ORDER — FUROSEMIDE 20 MG PO TABS
20.0000 mg | ORAL_TABLET | Freq: Every day | ORAL | Status: DC
  Administered 2015-06-10 – 2015-06-11 (×2): 20 mg via ORAL
  Filled 2015-06-08 (×2): qty 1

## 2015-06-08 MED ORDER — MEPERIDINE HCL 25 MG/ML IJ SOLN: 6.2500 mg | INTRAMUSCULAR | Status: DC | PRN

## 2015-06-08 MED ORDER — BUPIVACAINE-EPINEPHRINE (PF) 0.5% -1:200000 IJ SOLN
INTRAMUSCULAR | Status: DC | PRN
  Administered 2015-06-08: 20 mL via PERINEURAL

## 2015-06-08 MED ORDER — MENTHOL 3 MG MT LOZG: 1.0000 | LOZENGE | OROMUCOSAL | Status: DC | PRN

## 2015-06-08 MED ORDER — METOCLOPRAMIDE HCL 5 MG PO TABS
5.0000 mg | ORAL_TABLET | Freq: Three times a day (TID) | ORAL | Status: DC | PRN
Start: 2015-06-08 — End: 2015-06-11

## 2015-06-08 MED ORDER — POTASSIUM CHLORIDE IN NACL 20-0.9 MEQ/L-% IV SOLN
INTRAVENOUS | Status: DC
  Filled 2015-06-08: qty 1000

## 2015-06-08 MED ORDER — SODIUM CHLORIDE 0.9 % IR SOLN
Status: DC | PRN
  Administered 2015-06-08 (×3): 1000 mL

## 2015-06-08 SURGICAL SUPPLY — 70 items
APL SKNCLS STERI-STRIP NONHPOA (GAUZE/BANDAGES/DRESSINGS) ×1
BANDAGE ESMARK 6X9 LF (GAUZE/BANDAGES/DRESSINGS) ×1 IMPLANT
BENZOIN TINCTURE PRP APPL 2/3 (GAUZE/BANDAGES/DRESSINGS) ×2 IMPLANT
BLADE SAGITTAL 25.0X1.19X90 (BLADE) ×2 IMPLANT
BLADE SAW RECIP 87.9 MT (BLADE) ×2 IMPLANT
BLADE SAW SGTL 13X75X1.27 (BLADE) ×2 IMPLANT
BLADE SURG 10 STRL SS (BLADE) ×4 IMPLANT
BNDG CMPR 9X6 STRL LF SNTH (GAUZE/BANDAGES/DRESSINGS) ×1
BNDG CMPR MED 15X6 ELC VLCR LF (GAUZE/BANDAGES/DRESSINGS) ×1
BNDG ELASTIC 6X15 VLCR STRL LF (GAUZE/BANDAGES/DRESSINGS) ×2 IMPLANT
BNDG ESMARK 6X9 LF (GAUZE/BANDAGES/DRESSINGS) ×2
BOWL SMART MIX CTS (DISPOSABLE) ×2 IMPLANT
CAPT KNEE TOTAL 3 ATTUNE ×1 IMPLANT
CEMENT HV SMART SET (Cement) ×4 IMPLANT
CLSR STERI-STRIP ANTIMIC 1/2X4 (GAUZE/BANDAGES/DRESSINGS) ×1 IMPLANT
COVER SURGICAL LIGHT HANDLE (MISCELLANEOUS) ×2 IMPLANT
CUFF TOURNIQUET SINGLE 34IN LL (TOURNIQUET CUFF) ×2 IMPLANT
CUFF TOURNIQUET SINGLE 44IN (TOURNIQUET CUFF) IMPLANT
DECANTER SPIKE VIAL GLASS SM (MISCELLANEOUS) ×2 IMPLANT
DRAPE EXTREMITY T 121X128X90 (DRAPE) ×2 IMPLANT
DRAPE INCISE IOBAN 66X45 STRL (DRAPES) ×3 IMPLANT
DRAPE PROXIMA HALF (DRAPES) ×2 IMPLANT
DRAPE U-SHAPE 47X51 STRL (DRAPES) ×2 IMPLANT
DRSG AQUACEL AG ADV 3.5X14 (GAUZE/BANDAGES/DRESSINGS) ×2 IMPLANT
DURAPREP 26ML APPLICATOR (WOUND CARE) ×4 IMPLANT
ELECT CAUTERY BLADE 6.4 (BLADE) ×2 IMPLANT
ELECT REM PT RETURN 9FT ADLT (ELECTROSURGICAL) ×2
ELECTRODE REM PT RTRN 9FT ADLT (ELECTROSURGICAL) ×1 IMPLANT
FACESHIELD WRAPAROUND (MASK) ×2 IMPLANT
FACESHIELD WRAPAROUND OR TEAM (MASK) ×1 IMPLANT
GLOVE BIO SURGEON STRL SZ7 (GLOVE) ×2 IMPLANT
GLOVE BIOGEL PI IND STRL 7.0 (GLOVE) ×1 IMPLANT
GLOVE BIOGEL PI IND STRL 7.5 (GLOVE) ×1 IMPLANT
GLOVE BIOGEL PI INDICATOR 7.0 (GLOVE) ×1
GLOVE BIOGEL PI INDICATOR 7.5 (GLOVE) ×1
GLOVE SS BIOGEL STRL SZ 7.5 (GLOVE) ×1 IMPLANT
GLOVE SUPERSENSE BIOGEL SZ 7.5 (GLOVE) ×1
GOWN STRL REUS W/ TWL LRG LVL3 (GOWN DISPOSABLE) ×1 IMPLANT
GOWN STRL REUS W/ TWL XL LVL3 (GOWN DISPOSABLE) ×2 IMPLANT
GOWN STRL REUS W/TWL LRG LVL3 (GOWN DISPOSABLE) ×2
GOWN STRL REUS W/TWL XL LVL3 (GOWN DISPOSABLE) ×4
HANDPIECE INTERPULSE COAX TIP (DISPOSABLE) ×2
HOOD PEEL AWAY FACE SHEILD DIS (HOOD) ×4 IMPLANT
IMMOBILIZER KNEE 22 UNIV (SOFTGOODS) ×2 IMPLANT
KIT BASIN OR (CUSTOM PROCEDURE TRAY) ×2 IMPLANT
KIT ROOM TURNOVER OR (KITS) ×2 IMPLANT
MANIFOLD NEPTUNE II (INSTRUMENTS) ×2 IMPLANT
MARKER SKIN DUAL TIP RULER LAB (MISCELLANEOUS) ×2 IMPLANT
NDL 18GX1X1/2 (RX/OR ONLY) (NEEDLE) ×1 IMPLANT
NEEDLE 18GX1X1/2 (RX/OR ONLY) (NEEDLE) ×2 IMPLANT
NS IRRIG 1000ML POUR BTL (IV SOLUTION) ×2 IMPLANT
PACK TOTAL JOINT (CUSTOM PROCEDURE TRAY) ×2 IMPLANT
PAD ARMBOARD 7.5X6 YLW CONV (MISCELLANEOUS) ×4 IMPLANT
SET HNDPC FAN SPRY TIP SCT (DISPOSABLE) ×1 IMPLANT
STRIP CLOSURE SKIN 1/2X4 (GAUZE/BANDAGES/DRESSINGS) ×2 IMPLANT
SUCTION FRAZIER HANDLE 10FR (MISCELLANEOUS) ×1
SUCTION TUBE FRAZIER 10FR DISP (MISCELLANEOUS) ×1 IMPLANT
SUT MNCRL AB 3-0 PS2 18 (SUTURE) ×2 IMPLANT
SUT VIC AB 0 CT1 27 (SUTURE) ×6
SUT VIC AB 0 CT1 27XBRD ANBCTR (SUTURE) ×2 IMPLANT
SUT VIC AB 1 CT1 27 (SUTURE) ×2
SUT VIC AB 1 CT1 27XBRD ANBCTR (SUTURE) ×1 IMPLANT
SUT VIC AB 2-0 CT1 27 (SUTURE) ×4
SUT VIC AB 2-0 CT1 TAPERPNT 27 (SUTURE) ×2 IMPLANT
SYR 30ML LL (SYRINGE) ×2 IMPLANT
TOWEL OR 17X24 6PK STRL BLUE (TOWEL DISPOSABLE) ×2 IMPLANT
TOWEL OR 17X26 10 PK STRL BLUE (TOWEL DISPOSABLE) ×2 IMPLANT
TRAY FOLEY CATH 16FR SILVER (SET/KITS/TRAYS/PACK) ×2 IMPLANT
TUBE CONNECTING 12X1/4 (SUCTIONS) ×2 IMPLANT
YANKAUER SUCT BULB TIP NO VENT (SUCTIONS) ×2 IMPLANT

## 2015-06-08 NOTE — Progress Notes (Signed)
Orthopedic Tech Progress Note Patient Details:  Chad [name] 1955-04-03 [id number]  CPM Right Knee CPM Right Knee: On Right Knee Flexion (Degrees): 90 Right Knee Extension (Degrees): 0 Additional Comments: Trapeze bar and foot roll   [place] Pink 06/08/2015, 1:29 PM

## 2015-06-08 NOTE — Anesthesia Procedure Notes (Addendum)
Anesthesia Regional Block:  Adductor canal block  Pre-Anesthetic Checklist: ,, timeout performed, Correct Patient, Correct Site, Correct Laterality, Correct Procedure, Correct Position, site marked, Risks and benefits discussed,  Surgical consent,  Pre-op evaluation,  At surgeon's request and post-op pain management  Laterality: Lower and Right  Prep: chloraprep       Needles:  Injection technique: Single-shot  Needle Type: Echogenic Stimulator Needle          Additional Needles:  Procedures: ultrasound guided (picture in chart) Adductor canal block Narrative:  Injection made incrementally with aspirations every 5 mL.  Performed by: Personally  Anesthesiologist: Kloi Brodman  Additional Notes: H+P and labs reviewed, risks and benefits discussed with patient, procedure tolerated well without complications   Spinal Patient location during procedure: OR Staffing Anesthesiologist: Kaden Dunkel Preanesthetic Checklist Completed: patient identified, surgical consent, pre-op evaluation, timeout performed, IV checked, risks and benefits discussed and monitors and equipment checked Spinal Block Patient position: sitting Prep: site prepped and draped and DuraPrep Patient monitoring: heart rate, cardiac monitor, continuous pulse ox and blood pressure Approach: midline Location: L3-4 Injection technique: single-shot Needle Needle type: Pencan  Needle gauge: 24 G Needle length: 10 cm Assessment Sensory level: T8

## 2015-06-08 NOTE — Progress Notes (Signed)
Utilization review completed.  

## 2015-06-08 NOTE — Interval H&P Note (Signed)
History and Physical Interval Note:  06/08/2015 6:08 AM  Chad [name]  has presented today for surgery, with the diagnosis of Primary localized OA right knee  The various methods of treatment have been discussed with the patient and family. After consideration of risks, benefits and other options for treatment, the patient has consented to  Procedure(s): TOTAL KNEE ARTHROPLASTY (Right) as a surgical intervention .  The patient's history has been reviewed, patient examined, no change in status, stable for surgery.  I have reviewed the patient's chart and labs.  Questions were answered to the patient's satisfaction.     [name]

## 2015-06-08 NOTE — Anesthesia Preprocedure Evaluation (Addendum)
Anesthesia Evaluation  Patient identified by MRN, date of birth, ID band Patient awake    Reviewed: Allergy & Precautions, H&P , NPO status , Patient's Chart, lab work & pertinent test results  Airway Mallampati: III   Neck ROM: limited    Dental   Pulmonary shortness of breath, sleep apnea ,    breath sounds clear to auscultation       Cardiovascular hypertension, Pt. on medications + CAD and +CHF  + dysrhythmias  Rhythm:Regular Rate:Normal  Echo 02/2015 - Left ventricle: The cavity size was normal. Wall thickness was increased in a pattern of mild LVH. Systolic function was normal. The estimated ejection fraction was in the range of 50% to 55%. Wall motion was normal; there were no regional wall motion abnormalities. - Mitral valve: There was mild regurgitation. - Right ventricle: The cavity size was mildly dilated. Wall  thickness was normal.  Impressions: - Compared to the prior study, there has been no significant interval change.   Cardiac Cath 02/2015     Prox RCA to Mid RCA lesion, 10% stenosed.     Prox LAD to Mid LAD lesion, 25% stenosed.     Prox Cx lesion, 30% stenosed.     The left ventricular systolic function is normal.  1. Nonobstructive CAD. No significant change since 2014. 2. Normal LV function. - Right atrium: The atrium was moderately dilated.     Neuro/Psych    GI/Hepatic GERD  ,  Endo/Other  diabetesHypothyroidism Morbid obesity  Renal/GU      Musculoskeletal   Abdominal (+) + obese,   Peds  Hematology   Anesthesia Other Findings   Reproductive/Obstetrics                           Anesthesia Physical  Anesthesia Plan  ASA: III  Anesthesia Plan: Regional   Post-op Pain Management:    Induction: Intravenous  Airway Management Planned:   Additional Equipment:   Intra-op Plan:   Post-operative Plan: Extubation in OR  Informed Consent: I have  reviewed the patients History and Physical, chart, labs and discussed the procedure including the risks, benefits and alternatives for the proposed anesthesia with the patient or authorized representative who has indicated his/her understanding and acceptance.   Dental advisory given  Plan Discussed with: CRNA  Anesthesia Plan Comments:        Anesthesia Quick Evaluation

## 2015-06-08 NOTE — Progress Notes (Signed)
Orthopedic Tech Progress Note Patient Details:  Chad [name] [birth year] [id number] Pt. refused evening CPM. Patient ID: Chad [name], male   DOB: Feb 19, 1956, 60 y.o.   MRN: [id number]   [name] 06/08/2015, 9:27 PM

## 2015-06-08 NOTE — Progress Notes (Signed)
Dr. Lissa Hoard called and updated. Cleared to transfer to room

## 2015-06-08 NOTE — Transfer of Care (Signed)
Immediate Anesthesia Transfer of Care Note  Patient: Chad [name]  Procedure(s) Performed: Procedure(s): TOTAL KNEE ARTHROPLASTY (Right)  Patient Location: PACU  Anesthesia Type:MAC, Regional and Spinal  Level of Consciousness: awake, alert  and oriented  Airway & Oxygen Therapy: Patient Spontanous Breathing  Post-op Assessment: Report given to RN and Post -op Vital signs reviewed and stable  Post vital signs: Reviewed and stable  Last Vitals:  Filed Vitals:   06/08/15 0929 06/08/15 0933  BP: 144/60   Pulse: 63 63  Temp:    Resp: 22 19   HR 87, RR 18, BP 130/69, sats 643% on RA Complications: No apparent anesthesia complications

## 2015-06-09 ENCOUNTER — Encounter (HOSPITAL_COMMUNITY): Payer: Self-pay | Admitting: Orthopedic Surgery

## 2015-06-09 LAB — GLUCOSE, CAPILLARY
GLUCOSE-CAPILLARY: 291 mg/dL — AB (ref 65–99)
GLUCOSE-CAPILLARY: 261 mg/dL — AB (ref 65–99)
Glucose-Capillary: 250 mg/dL — ABNORMAL HIGH (ref 65–99)

## 2015-06-09 LAB — BASIC METABOLIC PANEL
Anion gap: 13 (ref 5–15)
BUN: 26 mg/dL — AB (ref 6–20)
CHLORIDE: 99 mmol/L — AB (ref 101–111)
CO2: 22 mmol/L (ref 22–32)
Calcium: 8.9 mg/dL (ref 8.9–10.3)
Creatinine, Ser: 1.12 mg/dL (ref 0.61–1.24)
GFR calc Af Amer: >60 mL/min (ref >60)
GFR calc non Af Amer: >60 mL/min (ref >60)
GLUCOSE: 250 mg/dL — AB (ref 65–99)
POTASSIUM: 4.5 mmol/L (ref 3.5–5.1)
Sodium: 134 mmol/L — ABNORMAL LOW (ref 135–145)

## 2015-06-09 LAB — CBC
HEMATOCRIT: 42.9 % (ref 39.0–52.0)
HEMOGLOBIN: 14.3 g/dL (ref 13.0–17.0)
MCH: 27.6 pg (ref 26.0–34.0)
MCHC: 33.3 g/dL (ref 30.0–36.0)
MCV: 82.7 fL (ref 78.0–100.0)
Platelets: 202 10*3/uL (ref 150–400)
RBC: 5.19 MIL/uL (ref 4.22–5.81)
RDW: 13.4 % (ref 11.5–15.5)
WBC: 15.1 10*3/uL — ABNORMAL HIGH (ref 4.0–10.5)

## 2015-06-09 MED ORDER — INSULIN ASPART 100 UNIT/ML ~~LOC~~ SOLN
0.0000 [IU] | Freq: Three times a day (TID) | SUBCUTANEOUS | Status: DC
  Administered 2015-06-09 (×2): 11 [IU] via SUBCUTANEOUS
  Administered 2015-06-10: 4 [IU] via SUBCUTANEOUS
  Administered 2015-06-10: 7 [IU] via SUBCUTANEOUS
  Administered 2015-06-10: 4 [IU] via SUBCUTANEOUS
  Administered 2015-06-11: 11 [IU] via SUBCUTANEOUS

## 2015-06-09 MED ORDER — INSULIN ASPART 100 UNIT/ML ~~LOC~~ SOLN
0.0000 [IU] | Freq: Every day | SUBCUTANEOUS | Status: DC
  Administered 2015-06-09: 3 [IU] via SUBCUTANEOUS

## 2015-06-09 MED ORDER — ASPIRIN EC 325 MG PO TBEC
325.0000 mg | DELAYED_RELEASE_TABLET | Freq: Two times a day (BID) | ORAL | Status: DC
  Administered 2015-06-09 (×2): 325 mg via ORAL
  Filled 2015-06-09 (×2): qty 1

## 2015-06-09 NOTE — Evaluation (Signed)
Physical Therapy Evaluation Patient Details Name: Chad [name] MRN: [id number] DOB: 12/24/1955 Today's Date: 06/09/2015   History of Present Illness  60 yo admitted for Rt TKA. PMHx: gastric bypass, OSA, HTN, OA, DM, CHF, transaminitis  Clinical Impression  Pt very pleasant and eager to progress mobility for return home. Pt able to complete heel slides, SLR and gait without assist. Pt educated for knee precautions, CPM use, bone foam (in use end of session), HEP, transfers, and plan. Pt with decreased strength, transfers and gait who will benefit from acute therapy to maximize function, strength and independence to return pt to PLOF.     Follow Up Recommendations Home health PT    Equipment Recommendations  None recommended by PT    Recommendations for Other Services OT consult     Precautions / Restrictions Precautions Precautions: Knee Required Braces or Orthoses: Knee Immobilizer - Right Knee Immobilizer - Right: On when out of bed or walking (per verbal order from [place] may D/C KI ) Restrictions RLE Weight Bearing: Weight bearing as tolerated      Mobility  Bed Mobility Overal bed mobility: Modified Independent                Transfers Overall transfer level: Needs assistance   Transfers: Sit to/from Stand Sit to Stand: Supervision         General transfer comment: cues for hand placement and safety  Ambulation/Gait Ambulation/Gait assistance: Supervision Ambulation Distance (Feet): 300 Feet Assistive device: Rolling walker (2 wheeled) Gait Pattern/deviations: Step-through pattern;Decreased stride length   Gait velocity interpretation: Below normal speed for age/gender General Gait Details: cues for posture, increased stride and heel strike  Stairs            Wheelchair Mobility    Modified Rankin (Stroke Patients Only)       Balance Overall balance assessment: No apparent balance deficits (not formally assessed)                                            Pertinent Vitals/Pain Pain Assessment: 0-10 Pain Score: 5  Pain Location: right knee Pain Descriptors / Indicators: Aching Pain Intervention(s): Limited activity within patient's tolerance;Repositioned;Premedicated before session;Monitored during session    Home Living Family/patient expects to be discharged to:: Private residence Living Arrangements: Spouse/significant other Available Help at Discharge: Available 24 hours/day Type of Home: Mobile home Home Access: Stairs to enter Entrance Stairs-Rails: Psychiatric nurse of Steps: 4 Home Layout: One level Home Equipment: Walker - 2 wheels;Bedside commode;Cane - single point      Prior Function Level of Independence: Independent               Hand Dominance        Extremity/Trunk Assessment   Upper Extremity Assessment: Overall WFL for tasks assessed           Lower Extremity Assessment: RLE deficits/detail RLE Deficits / Details: decreased ROM and strength as expected post op    Cervical / Trunk Assessment: Normal  Communication   Communication: HOH  Cognition Arousal/Alertness: Awake/alert Behavior During Therapy: WFL for tasks assessed/performed Overall Cognitive Status: Within Functional Limits for tasks assessed                      General Comments      Exercises Total Joint Exercises Quad Sets: AROM;Right;5 reps;Supine Heel Slides: AROM;Right;10  reps;Supine Straight Leg Raises: AROM;Seated;Right;10 reps      Assessment/Plan    PT Assessment Patient needs continued PT services  PT Diagnosis Difficulty walking;Acute pain   PT Problem List Decreased strength;Decreased range of motion;Decreased activity tolerance;Decreased mobility;Pain;Decreased knowledge of use of DME  PT Treatment Interventions Stair training;Gait training;DME instruction;Functional mobility training;Therapeutic activities;Therapeutic exercise;Patient/family  education   PT Goals (Current goals can be found in the Care Plan section) Acute Rehab PT Goals Patient Stated Goal: walking and fishing PT Goal Formulation: With patient/family Time For Goal Achievement: 06/16/15 Potential to Achieve Goals: Good    Frequency 7X/week   Barriers to discharge        Co-evaluation               End of Session Equipment Utilized During Treatment: Gait belt;Right knee immobilizer Activity Tolerance: Patient tolerated treatment well Patient left: in chair;with call bell/phone within reach;with nursing/sitter in room;with family/visitor present Nurse Communication: Mobility status         Time: 0725-0750 PT Time Calculation (min) (ACUTE ONLY): 25 min   Charges:   PT Evaluation $PT Eval Moderate Complexity: 1 Procedure PT Treatments $Gait Training: 8-22 mins   PT G CodesMelford Aase 06/09/2015, 7:55 AM Elwyn Reach, [place]

## 2015-06-09 NOTE — Progress Notes (Signed)
Subjective: 1 Day Post-Op Procedure(s) (LRB): TOTAL KNEE ARTHROPLASTY (Right) Patient reports pain as 4 on 0-10 scale.    Objective: Vital signs in last 24 hours: Temp:  [97.8 F (36.6 C)-98.9 F (37.2 C)] 98.2 F (36.8 C) (04/18 0630) Pulse Rate:  [60-89] 67 (04/18 0630) Resp:  [7-29] 12 (04/18 0630) BP: (124-158)/(43-78) 129/63 mmHg (04/18 0630) SpO2:  [91 %-100 %] 98 % (04/18 0735)  Intake/Output from previous day: 04/17 0701 - 04/18 0700 In: 1350 [P.O.:750; I.V.:600] Out: 1500 [Urine:1450; Blood:50] Intake/Output this shift: Total I/O In: -  Out: 100 [Urine:100]  No results for input(s): HGB in the last 72 hours. No results for input(s): WBC, RBC, HCT, PLT in the last 72 hours. No results for input(s): NA, K, CL, CO2, BUN, CREATININE, GLUCOSE, CALCIUM in the last 72 hours. No results for input(s): LABPT, INR in the last 72 hours.  ABD soft Neurovascular intact Sensation intact distally Intact pulses distally Dorsiflexion/Plantar flexion intact Incision: significant drainage from his proximal wound  Assessment/Plan: 1 Day Post-Op Procedure(s) (LRB): TOTAL KNEE ARTHROPLASTY (Right)  Principal Problem:   Primary osteoarthritis of right knee Active Problems:   Diabetes mellitus type 2 with retinopathy (HCC)   HTN (hypertension)   HLD (hyperlipidemia)   GERD (gastroesophageal reflux disease)   Irregular heart beat   OSA (obstructive sleep apnea)   Obesity, Class III, BMI 40-49.9 (morbid obesity) (HCC)   CAD (coronary artery disease)   1St degree AV block   Diastolic CHF, chronic (HCC)   Retinal hemorrhage of left eye   Primary localized osteoarthritis of right knee  Up with therapy Plan for discharge tomorrow Discharge home with home health  Linda Hedges 06/09/2015, 9:05 AM

## 2015-06-09 NOTE — Op Note (Signed)
MRN:     [id number] DOB/AGE:    1955-05-16 / 60 y.o.       OPERATIVE REPORT    DATE OF PROCEDURE:  06/09/2015       PREOPERATIVE DIAGNOSIS:   Primary localized OA right knee      Estimated body mass index is 47.68 kg/(m^2) as calculated from the following:   Height as of 05/29/15: 5' 9"  (1.753 m).   Weight as of this encounter: 146.512 kg (323 lb).                                                        POSTOPERATIVE DIAGNOSIS:   same                                                                      PROCEDURE:  Procedure(s): TOTAL KNEE ARTHROPLASTY Using Depuy Attune RP implants #6 Femur, #7Tibia, 23m  RP bearing, 35 Patella     SURGEON: Cindie Rajagopalan A    ASSISTANT:  Kirstin Shepperson PA-C   (Present and scrubbed throughout the case, critical for assistance with exposure, retraction, instrumentation, and closure.)         ANESTHESIA: Spinal with Adductor Nerve Block     TOURNIQUET TIME: 727POE  COMPLICATIONS:  None     SPECIMENS: None   INDICATIONS FOR PROCEDURE: The patient has  djd right knee, varus deformities, XR shows bone on bone arthritis. Patient has failed all conservative measures including anti-inflammatory medicines, narcotics, attempts at  exercise and weight loss, cortisone injections and viscosupplementation.  Risks and benefits of surgery have been discussed, questions answered.   DESCRIPTION OF PROCEDURE: The patient identified by armband, received  right femoral nerve block and IV antibiotics[facility] to the operating room, appropriate anesthetic  monitors were attached General endotracheal anesthesia induced with  the patient in supine position, Foley catheter was inserted. Tourniquet  applied high to the operative thigh. Lateral post and foot positioner  applied to the table, the lower extremity was then prepped and draped  in usual sterile fashion from the ankle to the tourniquet. Time-out procedure was  performed. The limb was wrapped with an Esmarch bandage and the tourniquet inflated to 365 mmHg. We began the operation by making the anterior midline incision starting at handbreadth above the patella going over the patella 1 cm medial to and  4 cm distal to the tibial tubercle. Small bleeders in the skin and the  subcutaneous tissue identified and cauterized. Transverse retinaculum was incised and reflected medially and a medial parapatellar arthrotomy was accomplished. the patella was everted and theprepatellar fat pad resected. The superficial medial collateral  ligament was then elevated from anterior to posterior along the proximal  flare of the tibia and anterior half of the menisci resected. The knee was hyperflexed exposing bone on bone arthritis. Peripheral and notch osteophytes as well as the cruciate ligaments were then resected. We continued to  work our way around posteriorly along the proximal tibia, and externally  rotated the tibia subluxing it out  from underneath the femur. A McHale  retractor was placed through the notch and a lateral Hohmann retractor  placed, and we then drilled through the proximal tibia in line with the  axis of the tibia followed by an intramedullary guide rod and 2-degree  posterior slope cutting guide. The tibial cutting guide was pinned into place  allowing resection of 4 mm of bone medially and about 6 mm of bone  laterally because of her varus deformity. Satisfied with the tibial resection, we then  entered the distal femur 2 mm anterior to the PCL origin with the  intramedullary guide rod and applied the distal femoral cutting guide  set at 681m, with 5 degrees of valgus. This was pinned along the  epicondylar axis. At this point, the distal femoral cut was accomplished without difficulty. We then sized for a #6 femoral component and pinned the guide in 3 degrees of external rotation.The chamfer cutting guide was pinned into place. The anterior,  posterior, and chamfer cuts were accomplished without difficulty followed by  the  RP box cutting guide and the box cut. We also removed posterior osteophytes from the posterior femoral condyles. At this  time, the knee was brought into full extension. We checked our  extension and flexion gaps and found them symmetric at 780m  The patella thickness measured at 25 mm. We set the cutting guide at 15 and removed the posterior 9.5-10 mm  of the patella sized for 35 button and drilled the lollipop. The knee  was then once again hyperflexed exposing the proximal tibia. We sized for a #7 tibial base plate, applied the smokestack and the conical reamer followed by the the Delta fin keel punch. We then hammered into place the  RP trial femoral component, inserted a 1 trial bearing, trial patellar button, and took the knee through range of motion from 0-130 degrees. No thumb pressure was required for patellar  tracking. At this point, all trial components were removed, a double batch of DePuy HV cement was mixed and applied to all bony metallic mating surfaces except for the posterior condyles of the femur itself. In order, we  hammered into place the tibial tray and removed excess cement, the femoral component and removed excess cement, a [id number] bearing  was inserted, and the knee brought to full extension with compression.  The patellar button was clamped into place, and excess cement  removed. While the cement cured the wound was irrigated out with normal saline solution pulse lavage.. Ligament stability and patellar tracking were checked and found to be excellent.. The parapatellar arthrotomy was closed with  #1 Vicryl suture. The subcutaneous tissue with 0 and 2-0 undyed  Vicryl suture, and 4-0 Monocryl.. A dressing of Aquaseal,  4 x 4, dressing sponges, Webril, and Ace wrap applied. Needle and sponge count were correct times 2.The patient awakened, extubated, and taken to recovery room without difficulty.  Vascular status was normal, pulses 2+ and symmetric.   Chad [name] A 06/09/2015, 7:42 AM

## 2015-06-09 NOTE — Care Management Note (Signed)
Case Management Note  Patient Details  Name: Chad [name] MRN: [id number] Date of Birth: Jan 29, 1956  Subjective/Objective:        S/p right total knee arthroplasty            Action/Plan: Set up with [facility] for HHPT by MD office. No change in discharge plan. [employer] has delivered CPM to home, patient already had rolling walker and 3N1. Patient stated that his wife will be assisting him after discharge.   Expected Discharge Date:                  Expected Discharge Plan:  [place]  In-House Referral:  NA  Discharge planning Services  CM Consult  Post Acute Care Choice:  Durable Medical Equipment, Home Health Choice offered to:  Patient  DME Arranged:  CPM DME Agency:  [facility]  HH Arranged:  PT HH Agency:  [facility]  Status of Service:  Completed, signed off  Medicare Important Message Given:    Date Medicare IM Given:    Medicare IM give by:    Date Additional Medicare IM Given:    Additional Medicare Important Message give by:     If discussed at [place] of Stay Meetings, dates discussed:    Additional Comments:  Nila Nephew, RN 06/09/2015, 1:25 PM

## 2015-06-09 NOTE — Progress Notes (Signed)
Physical Therapy Progress Note Pt with continued excellent gait and mobility. Did not stress knee flexion at this time due to bleeding in AM and PA keeping him due to bleed. Pt has met goals necessary for safe D/C home. Will continue to follow acutely.   06/09/15 1132  PT Visit Information  Last PT Received On 06/09/15  Assistance Needed +1  History of Present Illness 60 yo admitted for Rt TKA. PMHx: gastric bypass, OSA, HTN, OA, DM, CHF, transaminitis  PT Time Calculation  PT Start Time (ACUTE ONLY) 1126  PT Stop Time (ACUTE ONLY) 1146  PT Time Calculation (min) (ACUTE ONLY) 20 min  Precautions  Precautions Knee  Restrictions  RLE Weight Bearing WBAT  Pain Assessment  Pain Assessment 0-10  Pain Score 4  Pain Location right knee  Pain Descriptors / Indicators Aching;Sore  Pain Intervention(s) Limited activity within patient's tolerance;Premedicated before session;Monitored during session;Repositioned  Cognition  Arousal/Alertness Awake/alert  Behavior During Therapy WFL for tasks assessed/performed  Overall Cognitive Status Within Functional Limits for tasks assessed  Bed Mobility  General bed mobility comments in chair on arrival  Transfers  Overall transfer level Modified independent  Ambulation/Gait  Ambulation/Gait assistance Supervision  Ambulation Distance (Feet) 350 Feet  Assistive device Rolling walker (2 wheeled)  Gait Pattern/deviations Step-through pattern;Decreased stride length  General Gait Details cues for posture and position in RW  Gait velocity interpretation at or above normal speed for age/gender  Stairs Yes  Stairs assistance Modified independent (Device/Increase time)  Stair Management Two rails;Step to pattern;Forwards  Number of Stairs 10  General stair comments pt educated for stair options but able to complete forward with rails without difficulty  Exercises  Exercises Total Joint  Total Joint Exercises  Heel Slides AROM;Right;10 reps;Seated   Straight Leg Raises AROM;Seated;Right;15 reps  Short Arc Quad AROM;Right;15 reps;Seated  Hip ABduction/ADduction AROM;Right;15 reps;Seated  Goniometric ROM 13-44  PT - End of Session  Equipment Utilized During Treatment Gait belt  Activity Tolerance Patient tolerated treatment well  Patient left in chair;with call bell/phone within reach;with family/visitor present  PT - Assessment/Plan  PT Plan Current plan remains appropriate  Follow Up Recommendations Home health PT  PT Goal Progression  Progress towards PT goals Progressing toward goals  PT General Charges  $$ ACUTE PT VISIT 1 Procedure  PT Treatments  $Gait Training 8-22 mins  Elwyn Reach, [place]

## 2015-06-09 NOTE — Evaluation (Signed)
Occupational Therapy Evaluation Patient Details Name: Chad [name] MRN: [id number] DOB: June 05, 1955 Today's Date: 06/09/2015    History of Present Illness 60 yo admitted for Rt TKA. PMHx: gastric bypass, OSA, HTN, OA, DM, GERD, transaminitis   Clinical Impression   Pt s/p above. Education provided in session to pt and spouse. Feel pt is safe to d/c home, from OT standpoint. OT signing off.    Follow Up Recommendations  No OT follow up;Supervision - Intermittent    Equipment Recommendations  None recommended by OT    Recommendations for Other Services       Precautions / Restrictions Precautions Precautions: Knee Required Braces or Orthoses: Knee Immobilizer - Right Knee Immobilizer - Right:  (per PT eval, verbal order from Dr. Noemi Chapel may d/c KI) Restrictions Weight Bearing Restrictions: Yes RLE Weight Bearing: Weight bearing as tolerated      Mobility Bed Mobility               General bed mobility comments: not assessed-pt in chair  Transfers Overall transfer level: Needs assistance   Transfers: Sit to/from Stand Sit to Stand:  (Min guard-Supervision; and set up for RW)              Balance    Unsteady ambulating without RW.                                        ADL Overall ADL's : Needs assistance/impaired                     Lower Body Dressing: Supervision/safety;Set up;Sit to/from stand   Toilet Transfer: Supervision/safety;Ambulation;RW (also set up for RW; sit to stand from chair)       Tub/ Shower Transfer: Walk-in shower;Min guard;Ambulation   Functional mobility during ADLs:  (Min guard without RW; Supervision with RW and setup for RW) General ADL Comments: Educated on LB dressing technique. Educated on safety such as using bag/basket on walker, safe footwear, sitting for LB ADLs, pet, recommended someone be with pt for shower transfer. Educated on benefit of reaching down to right foot to right foot as it  allows knee to bend. Pt able to don/doff right and left sock. Educated on energy conservation. educated on shower transfer techniques/safety.     Vision     Perception     Praxis      Pertinent Vitals/Pain Pain Assessment: 0-10 Pain Score: 5  Pain Location: Rt LE Pain Descriptors / Indicators: Burning;Other (Comment) (stinging) Pain Intervention(s): Repositioned;Monitored during session     Hand Dominance     Extremity/Trunk Assessment Upper Extremity Assessment Upper Extremity Assessment: LUE deficits/detail LUE Deficits / Details: Lt shoulder flexors slightly weaker than Rt   Lower Extremity Assessment Lower Extremity Assessment: Defer to PT evaluation       Communication Communication Communication: HOH   Cognition Arousal/Alertness: Awake/alert Behavior During Therapy: WFL for tasks assessed/performed Overall Cognitive Status: Within Functional Limits for tasks assessed                     General Comments          Shoulder Instructions      Home Living Family/patient expects to be discharged to:: Private residence Living Arrangements: Spouse/significant other Available Help at Discharge: Available 24 hours/day Type of Home: Mobile home Home Access: Stairs to enter [employer] of Steps: 4 Entrance  Stairs-Rails: Right;Left Home Layout: One level     Bathroom Shower/Tub: Occupational psychologist: Standard     Home Equipment: Environmental consultant - 2 wheels;Bedside commode;Cane - single point          Prior Functioning/Environment Level of Independence: Independent             OT Diagnosis: Acute pain   OT Problem List: Decreased strength;Decreased activity tolerance;Impaired balance (sitting and/or standing);Pain;Obesity   OT Treatment/Interventions:      OT Goals(Current goals can be found in the care plan section)    OT Frequency:     Barriers to D/C:            Co-evaluation              End of Session  Equipment Utilized During Treatment: Right knee immobilizer;Rolling walker CPM Right Knee CPM Right Knee: Off  Activity Tolerance: Patient tolerated treatment well Patient left: with call bell/phone within reach;in chair;with family/visitor present   Time: 5391-2258 OT Time Calculation (min): 20 min Charges:  OT General Charges $OT Visit: 1 Procedure OT Evaluation $OT Eval Moderate Complexity: 1 Procedure G-CodesBenito Mccreedy OTR/L [phone] 06/09/2015, 12:30 PM

## 2015-06-09 NOTE — Progress Notes (Signed)
Orthopedic Tech Progress Note Patient Details:  Chad [name] 1955/05/25 [id number]  Patient ID: [name], male   DOB: 1955/07/30, 60 y.o.   MRN: [id number] I came to put on cpm. Pt. Had bleeding issues today and they didn't want him back in the cpm due to possibility that the bleeding will start again.  Karolee Stamps 06/09/2015, 8:58 PM

## 2015-06-10 ENCOUNTER — Encounter (HOSPITAL_COMMUNITY): Payer: Self-pay | Admitting: Physician Assistant

## 2015-06-10 DIAGNOSIS — D696 Thrombocytopenia, unspecified: Secondary | ICD-10-CM | POA: Diagnosis not present

## 2015-06-10 HISTORY — DX: Thrombocytopenia, unspecified: D69.6

## 2015-06-10 LAB — CBC
HEMATOCRIT: 39.8 % (ref 39.0–52.0)
HEMOGLOBIN: 13.8 g/dL (ref 13.0–17.0)
MCH: 28.2 pg (ref 26.0–34.0)
MCHC: 34.7 g/dL (ref 30.0–36.0)
MCV: 81.4 fL (ref 78.0–100.0)
Platelets: 73 10*3/uL — ABNORMAL LOW (ref 150–400)
RBC: 4.89 MIL/uL (ref 4.22–5.81)
RDW: 13.5 % (ref 11.5–15.5)
WBC: 16.5 10*3/uL — AB (ref 4.0–10.5)

## 2015-06-10 LAB — BASIC METABOLIC PANEL
ANION GAP: 12 (ref 5–15)
BUN: 28 mg/dL — ABNORMAL HIGH (ref 6–20)
CALCIUM: 8.9 mg/dL (ref 8.9–10.3)
CO2: 26 mmol/L (ref 22–32)
Chloride: 95 mmol/L — ABNORMAL LOW (ref 101–111)
Creatinine, Ser: 1.03 mg/dL (ref 0.61–1.24)
GFR calc non Af Amer: >60 mL/min (ref >60)
GLUCOSE: 245 mg/dL — AB (ref 65–99)
POTASSIUM: 4.6 mmol/L (ref 3.5–5.1)
Sodium: 133 mmol/L — ABNORMAL LOW (ref 135–145)

## 2015-06-10 LAB — GLUCOSE, CAPILLARY
GLUCOSE-CAPILLARY: 238 mg/dL — AB (ref 65–99)
GLUCOSE-CAPILLARY: 163 mg/dL — AB (ref 65–99)
GLUCOSE-CAPILLARY: 141 mg/dL — AB (ref 65–99)
Glucose-Capillary: 162 mg/dL — ABNORMAL HIGH (ref 65–99)

## 2015-06-10 MED ORDER — INSULIN GLARGINE 100 UNIT/ML ~~LOC~~ SOLN
15.0000 [IU] | Freq: Every day | SUBCUTANEOUS | Status: DC
  Administered 2015-06-10 – 2015-06-11 (×2): 15 [IU] via SUBCUTANEOUS
  Filled 2015-06-10 (×2): qty 0.15

## 2015-06-10 MED ORDER — ASPIRIN EC 325 MG PO TBEC
325.0000 mg | DELAYED_RELEASE_TABLET | Freq: Every day | ORAL | Status: DC
  Administered 2015-06-10 – 2015-06-11 (×2): 325 mg via ORAL
  Filled 2015-06-10 (×2): qty 1

## 2015-06-10 NOTE — Progress Notes (Signed)
Patient's dressing has remained intact without need to reinforce during night shift.  Did not place patient in CPM.  Would like doctor or PA to address time frame of when patient can re-start using CPM since his incision had substantial bleeding during the day shift yesterday.

## 2015-06-10 NOTE — Progress Notes (Signed)
Orthopedic Tech Progress Note Patient Details:  Chad [name] January 19, 1956 [id number]  Patient ID: Chad [name], male   DOB: [birth year], 60 y.o.   MRN: [id number] Spoke to rn about putting pt in cpm. She thinks for safety we should wait till dr sees pt to apply cpm.  Karolee Stamps 06/10/2015, 5:41 AM

## 2015-06-10 NOTE — Progress Notes (Signed)
Physical Therapy Treatment Patient Details Name: Chad [name] MRN: [id number] DOB: [birth year] Today's Date: 06/10/2015    History of Present Illness 60 yo admitted for Rt TKA. PMHx: gastric bypass, OSA, HTN, OA, DM, GERD, transaminitis    PT Comments    Pt progressing to mod I level, advised him to ambulate each hour today, held knee flexion stretching due to bleeding yesterday but pt able to sit with knee at 80 deg flexion without discomfort. PT will continue to follow.   Follow Up Recommendations  Home health PT     Equipment Recommendations  None recommended by PT    Recommendations for Other Services OT consult     Precautions / Restrictions Precautions Precautions: Knee Knee Immobilizer - Right:  (per PT eval, verbal order from Dr. Noemi Chapel may d/c KI) Restrictions Weight Bearing Restrictions: Yes RLE Weight Bearing: Weight bearing as tolerated    Mobility  Bed Mobility               General bed mobility comments: not assessed-pt in chair  Transfers Overall transfer level: Modified independent Equipment used: Rolling walker (2 wheeled) Transfers: Sit to/from Stand Sit to Stand: Modified independent (Device/Increase time) (Min guard-Supervision; and set up for RW)         General transfer comment: pt standing safely from multiple surfaces  Ambulation/Gait Ambulation/Gait assistance: Modified independent (Device/Increase time) Ambulation Distance (Feet): 500 Feet Assistive device: Rolling walker (2 wheeled) Gait Pattern/deviations: Step-through pattern;Decreased stride length;Decreased step length - right Gait velocity: increasing Gait velocity interpretation: at or above normal speed for age/gender General Gait Details: vc's for smoothing out gait pattern, pt able to do so with increased distance. Worked on right knee flexion with swing through and heel strike   Stairs Stairs: Yes Stairs assistance: Modified independent (Device/Increase time) Stair  Management: Two rails;Step to pattern Number of Stairs: 3 General stair comments: pt able to begin stepping up short step with RLE for strengthening, still descended with RLE leading for safety  Wheelchair Mobility    Modified Rankin (Stroke Patients Only)       Balance Overall balance assessment: No apparent balance deficits (not formally assessed)                                  Cognition Arousal/Alertness: Awake/alert Behavior During Therapy: WFL for tasks assessed/performed Overall Cognitive Status: Within Functional Limits for tasks assessed                      Exercises Total Joint Exercises Ankle Circles/Pumps: AROM;Both;10 reps;Seated Quad Sets: AROM;Right;10 reps;Seated Heel Slides: AROM;Right;10 reps;Seated Hip ABduction/ADduction: AROM;Right;15 reps;Seated Straight Leg Raises: AROM;Seated;Right;15 reps Long Arc Quad: AROM;Right;10 reps;Seated Goniometric ROM: 10-80    General Comments General comments (skin integrity, edema, etc.): advised pt to get up and walk each hour today      Pertinent Vitals/Pain Pain Assessment: Faces Faces Pain Scale: Hurts a little bit Pain Location: "doesn't hurt much", R knee Pain Descriptors / Indicators: Sore Pain Intervention(s): Monitored during session    Home Living                      Prior Function            PT Goals (current goals can now be found in the care plan section) Acute Rehab PT Goals Patient Stated Goal: walking and fishing PT Goal Formulation: With patient/family Time  For Goal Achievement: 06/16/15 Potential to Achieve Goals: Good Progress towards PT goals: Progressing toward goals    Frequency  7X/week    PT Plan Current plan remains appropriate    Co-evaluation             End of Session   Activity Tolerance: Patient tolerated treatment well Patient left: in chair;with call bell/phone within reach;with family/visitor present     Time:  0905-0925 PT Time Calculation (min) (ACUTE ONLY): 20 min  Charges:  $Gait Training: 8-22 mins                    G Codes:     Leighton Roach, PT  Acute Rehab Services  [phone]  Leighton Roach 06/10/2015, 10:18 AM

## 2015-06-10 NOTE — Progress Notes (Signed)
Subjective: 2 Days Post-Op Procedure(s) (LRB): TOTAL KNEE ARTHROPLASTY (Right) Patient reports pain as 3 on 0-10 scale.    Objective: Vital signs in last 24 hours: Temp:  [98.1 F (36.7 C)-98.6 F (37 C)] 98.1 F (36.7 C) (04/19 0500) Pulse Rate:  [67-72] 67 (04/19 0500) Resp:  [17-20] 17 (04/19 0500) BP: (135-152)/(53-66) 143/53 mmHg (04/19 0500) SpO2:  [97 %-98 %] 97 % (04/19 0500)  Intake/Output from previous day: 04/18 0701 - 04/19 0700 In: 720 [P.O.:720] Out: 1700 [Urine:1700] Intake/Output this shift:     Recent Labs  06/09/15 0818 06/10/15 0401  HGB 14.3 13.8    Recent Labs  06/09/15 0818 06/10/15 0401  WBC 15.1* 16.5*  RBC 5.19 4.89  HCT 42.9 39.8  PLT 202 73*    Recent Labs  06/09/15 0818  NA 134*  K 4.5  CL 99*  CO2 22  BUN 26*  CREATININE 1.12  GLUCOSE 250*  CALCIUM 8.9   No results for input(s): LABPT, INR in the last 72 hours.  ABD soft Neurovascular intact Sensation intact distally Intact pulses distally Dorsiflexion/Plantar flexion intact Incision: dressing C/D/I  Assessment/Plan: 2 Days Post-Op Procedure(s) (LRB): TOTAL KNEE ARTHROPLASTY (Right)  Principal Problem:   Primary osteoarthritis of right knee Active Problems:   Diabetes mellitus type 2 with retinopathy (HCC)   HTN (hypertension)   HLD (hyperlipidemia)   GERD (gastroesophageal reflux disease)   Irregular heart beat   OSA (obstructive sleep apnea)   Obesity, Class III, BMI 40-49.9 (morbid obesity) (HCC)   CAD (coronary artery disease)   1St degree AV block   Diastolic CHF, chronic (HCC)   Retinal hemorrhage of left eye   Primary localized osteoarthritis of right knee   Thrombocytopenia (HCC)  Advance diet Up with therapy Plan for discharge tomorrow  Will hold discharge until tomorrow because platelet count is 73, glucose in the 200s and WBC is rising.  I have changed his ASA for DVT dosage to qday instead of BID.  I have added Lantus to his hyperglycemia  treatment.  Will follow for another day due to his significant wound drainage yesterday and thrombocytopenia today.  Lamari Youngers J 06/10/2015, 7:59 AM

## 2015-06-10 NOTE — Progress Notes (Signed)
Lab called this am. They will have to come back and re-drawer blood work. Patient informed.

## 2015-06-10 NOTE — Anesthesia Postprocedure Evaluation (Signed)
Anesthesia Post Note  Patient: Chad [name]  Procedure(s) Performed: Procedure(s) (LRB): TOTAL KNEE ARTHROPLASTY (Right)  Patient location during evaluation: PACU Anesthesia Type: Spinal and Regional Level of consciousness: awake Pain management: pain level controlled Vital Signs Assessment: post-procedure vital signs reviewed and stable Respiratory status: spontaneous breathing Cardiovascular status: stable Postop Assessment: no signs of nausea or vomiting Anesthetic complications: no    Last Vitals:  Filed Vitals:   06/10/15 1430 06/10/15 2112  BP: 137/53 147/64  Pulse: 61 63  Temp: 36.7 C 37 C  Resp: 18 18    Last Pain:  Filed Vitals:   06/10/15 2113  PainSc: 3                  Aerica Rincon

## 2015-06-10 NOTE — Progress Notes (Signed)
Physical Therapy Treatment Patient Details Name: Chad [name] MRN: [id number] DOB: Jun 01, 1955 Today's Date: 06/10/2015    History of Present Illness 60 yo admitted for Rt TKA. PMHx: gastric bypass, OSA, HTN, OA, DM, GERD, transaminitis    PT Comments    Pt had just ambulated 500' with wife, there ex performed in standing/ sitting/ and supine. Pt demonstrating good quad activation as well as right knee flexion to 90 degrees despite being unable to use CPM due to bleeding. PT will continue to follow.   Follow Up Recommendations  Home health PT     Equipment Recommendations  None recommended by PT    Recommendations for Other Services       Precautions / Restrictions Precautions Precautions: Knee Knee Immobilizer - Right:  (per PT eval, verbal order from Dr. Noemi Chapel may d/c KI) Restrictions Weight Bearing Restrictions: Yes RLE Weight Bearing: Weight bearing as tolerated    Mobility  Bed Mobility Overal bed mobility: Independent             General bed mobility comments: pt got in and out of bed without physical assist or use of rail  Transfers Overall transfer level: Modified independent Equipment used: Rolling walker (2 wheeled) Transfers: Sit to/from Stand Sit to Stand: Modified independent (Device/Increase time) (Min guard-Supervision; and set up for RW)         General transfer comment: reminder for safe hand placement though even when pt places hands on RW, he is not putting significant wt through so no tipping  Ambulation/Gait             General Gait Details: NT due to pt having just walked   Science writer    Modified Rankin (Stroke Patients Only)       Balance Overall balance assessment: No apparent balance deficits (not formally assessed)                                  Cognition Arousal/Alertness: Awake/alert Behavior During Therapy: WFL for tasks assessed/performed Overall Cognitive  Status: Within Functional Limits for tasks assessed                      Exercises Total Joint Exercises Ankle Circles/Pumps: AROM;Both;Supine;20 reps Quad Sets: AROM;Right;Seated;20 reps Gluteal Sets: AROM;Right;20 reps;Supine Heel Slides: AROM;Right;20 reps;Supine Hip ABduction/ADduction: AROM;Right;15 reps;Supine Straight Leg Raises: AROM;Right;15 reps;Supine Long Arc Quad: AROM;Right;10 reps;Seated Knee Flexion: AROM;Right;10 reps;Seated Goniometric ROM: 10-90 Marching in Standing: AROM;Both;15 reps;Standing Standing Hip Extension: AROM;Right;10 reps;Standing    General Comments  No bleeding noted on ace wrap today      Pertinent Vitals/Pain Pain Assessment: 0-10 Pain Score: 3  Pain Location: right knee Pain Descriptors / Indicators: Aching Pain Intervention(s): Limited activity within patient's tolerance;Monitored during session    Home Living                      Prior Function            PT Goals (current goals can now be found in the care plan section) Acute Rehab PT Goals Patient Stated Goal: walking and fishing PT Goal Formulation: With patient/family Time For Goal Achievement: 06/16/15 Potential to Achieve Goals: Good Progress towards PT goals: Progressing toward goals    Frequency  7X/week    PT Plan Current plan remains appropriate    Co-evaluation  End of Session   Activity Tolerance: Patient tolerated treatment well Patient left: with call bell/phone within reach;with family/visitor present;in bed     Time: 1610-9604 PT Time Calculation (min) (ACUTE ONLY): 21 min  Charges:  $Therapeutic Exercise: 8-22 mins                    G Codes:     Leighton Roach, PT  Acute Rehab Services  [phone]  Leighton Roach 06/10/2015, 4:02 PM

## 2015-06-10 NOTE — Consult Note (Signed)
   [facility] CM Inpatient Consult   06/10/2015  Chad [name] 09/09/1955 [id number]    Patient screened for [place] Management services. Went to bedside to offer and explain [facility] Care Management program with patient. Spoke with patient and wife. Patient declined [place] Management follow up. Accepted [facility] Care Management brochure with contact information to call in future if changes mind. Made inpatient RNCM aware that patient declined [place] Management program services.  Marthenia Rolling, MSN-Ed, RN,BSN [facility] Liaison [phone]

## 2015-06-10 NOTE — Progress Notes (Signed)
Orthopedic Tech Progress Note Patient Details:  Chad [name] 09-14-1955 [id number] Pt. refused evening CPM, stated "It caused me to bleed last night." Patient ID: Chad [name], male   DOB: 12-22-1955, 60 y.o.   MRN: [id number]   [name] 06/10/2015, 7:53 PM

## 2015-06-11 LAB — BASIC METABOLIC PANEL
Anion gap: 11 (ref 5–15)
BUN: 24 mg/dL — AB (ref 6–20)
CALCIUM: 8.6 mg/dL — AB (ref 8.9–10.3)
CHLORIDE: 97 mmol/L — AB (ref 101–111)
CO2: 28 mmol/L (ref 22–32)
CREATININE: 0.94 mg/dL (ref 0.61–1.24)
GFR calc Af Amer: >60 mL/min (ref >60)
GFR calc non Af Amer: >60 mL/min (ref >60)
Glucose, Bld: 138 mg/dL — ABNORMAL HIGH (ref 65–99)
Potassium: 4 mmol/L (ref 3.5–5.1)
SODIUM: 136 mmol/L (ref 135–145)

## 2015-06-11 LAB — CBC
HCT: 35.7 % — ABNORMAL LOW (ref 39.0–52.0)
HEMOGLOBIN: 11.8 g/dL — AB (ref 13.0–17.0)
MCH: 27.1 pg (ref 26.0–34.0)
MCHC: 33.1 g/dL (ref 30.0–36.0)
MCV: 81.9 fL (ref 78.0–100.0)
PLATELETS: 149 10*3/uL — AB (ref 150–400)
RBC: 4.36 MIL/uL (ref 4.22–5.81)
RDW: 13.5 % (ref 11.5–15.5)
WBC: 9.1 10*3/uL (ref 4.0–10.5)

## 2015-06-11 LAB — GLUCOSE, CAPILLARY: GLUCOSE-CAPILLARY: 212 mg/dL — AB (ref 65–99)

## 2015-06-11 MED ORDER — POLYETHYLENE GLYCOL 3350 17 G PO PACK: PACK | ORAL | Status: DC

## 2015-06-11 MED ORDER — OXYCODONE HCL ER 20 MG PO T12A: EXTENDED_RELEASE_TABLET | ORAL | Status: DC

## 2015-06-11 MED ORDER — ASPIRIN 325 MG PO TBEC: DELAYED_RELEASE_TABLET | ORAL | Status: DC

## 2015-06-11 MED ORDER — OXYCODONE HCL 5 MG PO TABS: ORAL_TABLET | ORAL | Status: DC

## 2015-06-11 MED ORDER — DSS 100 MG PO CAPS: ORAL_CAPSULE | ORAL | Status: DC

## 2015-06-11 NOTE — Progress Notes (Signed)
Physical Therapy Treatment Patient Details Name: NIMAI BURBACH MRN: [id number] DOB: 04-Jan-1956 Today's Date: 18-Jun-2015    History of Present Illness 60 yo admitted for Rt TKA. PMHx: gastric bypass, OSA, HTN, OA, DM, GERD, transaminitis    PT Comments    Pt with continued progression with all mobility using cane today and able to increase ROM. Pt moving well and anxious for D/C home. Will follow acutely.   Follow Up Recommendations  Home health PT     Equipment Recommendations  None recommended by PT    Recommendations for Other Services       Precautions / Restrictions Precautions Precautions: Knee Restrictions RLE Weight Bearing: Weight bearing as tolerated    Mobility  Bed Mobility               General bed mobility comments: in chair on arrival  Transfers Overall transfer level: Modified independent                  Ambulation/Gait Ambulation/Gait assistance: Modified independent (Device/Increase time);Supervision Ambulation Distance (Feet): 500 Feet Assistive device: Straight cane Gait Pattern/deviations: Step-through pattern   Gait velocity interpretation: at or above normal speed for age/gender General Gait Details: pt with initial cues for cane use with pt able to return demonstrate   Stairs            Wheelchair Mobility    Modified Rankin (Stroke Patients Only)       Balance                                    Cognition Arousal/Alertness: Awake/alert Behavior During Therapy: WFL for tasks assessed/performed Overall Cognitive Status: Within Functional Limits for tasks assessed                      Exercises Total Joint Exercises Hip ABduction/ADduction: AROM;Right;20 reps;Seated Straight Leg Raises: AROM;Seated;Right;20 reps Long Arc Quad: AROM;Seated;Right;20 reps Goniometric ROM: 5-94 Marching in Standing: AROM;Seated;Right;20 reps    General Comments        Pertinent Vitals/Pain Pain  Assessment: No/denies pain Pain Location: 5/10 before activity after bone foam, 0/10 after gait and HEP right knee Pain Intervention(s): Premedicated before session;Repositioned    Home Living                      Prior Function            PT Goals (current goals can now be found in the care plan section) Progress towards PT goals: Progressing toward goals    Frequency       PT Plan Current plan remains appropriate    Co-evaluation             End of Session   Activity Tolerance: Patient tolerated treatment well Patient left: in chair;with call bell/phone within reach;with family/visitor present     Time: 5790-3833 PT Time Calculation (min) (ACUTE ONLY): 14 min  Charges:  $Gait Training: 8-22 mins                    G Codes:      Melford Aase 2015-06-18, 8:57 AM Elwyn Reach, [place]

## 2015-06-11 NOTE — Discharge Summary (Signed)
Patient ID: AMAHRI DENGEL MRN: [id number] DOB/AGE: 07/11/1955 60 y.o.  Admit date: 06/08/2015 Discharge date: 06/11/2015  Admission Diagnoses:  Principal Problem:   Primary osteoarthritis of right knee Active Problems:   Diabetes mellitus type 2 with retinopathy (HCC)   HTN (hypertension)   HLD (hyperlipidemia)   GERD (gastroesophageal reflux disease)   Irregular heart beat   OSA (obstructive sleep apnea)   Obesity, Class III, BMI 40-49.9 (morbid obesity) (HCC)   CAD (coronary artery disease)   1St degree AV block   Diastolic CHF, chronic (HCC)   Retinal hemorrhage of left eye   Primary localized osteoarthritis of right knee   Thrombocytopenia ([place])   Discharge Diagnoses:  Same  Past Medical History  Diagnosis Date  . History of diabetes mellitus 1990s    with mild background retinopathy, resolved with weight loss  . HTN (hypertension)   . HLD (hyperlipidemia)     statin caused leg cramps  . GERD (gastroesophageal reflux disease)     severe, h/o gastritis and GI bleed, per pt normal EGD at [facility] 2008  . Seasonal allergies   . Hyperplastic colon polyp 2008  . Hypothyroid   . Sensorineural hearing loss, bilateral     hearing aides  . Gastric bypass status for obesity 1985  . Morbid obesity ([place])   . Bulging lumbar disc   . Bone spur     L4 L5  . Narrowing of lumbar spine   . Right ear pain     s/p eval by ENT - thought TMJ referred pain and sent to oral surg for dental splint  . Diastolic CHF, chronic ([place]) 04/02/2012  . OSA (obstructive sleep apnea)     unable to use CPAP as of last try 2/2 h/o tracheostomy?  Marland Kitchen Arthritis     "both hips and knees; got shots in each hip in August" (01/25/2013)  . Trifascicular block  RBBB/LPFB/1AVB   . PVC (premature ventricular contraction)     RBBB Infer axis  . Tinnitus     due to sensorineural hearing loss R>L with ETD  . Abnormal drug screen     innaprop negative for hydrocodone 09/2013, inapprop negative for hydrocodone and  tramadol 02/2014; inappropr negative hydrocodone 03/2015  . Acute diverticulitis 08/15/2014  . Otomycosis of right ear 07/06/2011  . Diabetes mellitus without complication ([place])     no medicarions in over 2 years,wt. loss 100 lbs  . Thrombocytopenia ([place]) 06/10/2015    Surgeries: Procedure(s): TOTAL KNEE ARTHROPLASTY on 06/08/2015   Consultants:    Discharged Condition: Improved  Hospital Course: Chad [name] is an 60 y.o. male who was admitted 06/08/2015 for operative treatment ofPrimary osteoarthritis of right knee. Patient has severe unremitting pain that affects sleep, daily activities, and work/hobbies. After pre-op clearance the patient was taken to the operating room on 06/08/2015 and underwent  Procedure(s): TOTAL KNEE ARTHROPLASTY.    Patient was given perioperative antibiotics: Anti-infectives    Start     Dose/Rate Route Frequency Ordered Stop   06/08/15 1600  ceFAZolin (ANCEF) IVPB 2g/100 mL premix     2 g 200 mL/hr over 30 Minutes Intravenous Every 6 hours 06/08/15 1548 06/09/15 0359   06/08/15 1000  ceFAZolin (ANCEF) 3 g in dextrose 5 % 50 mL IVPB     3 g 130 mL/hr over 30 Minutes Intravenous To ShortStay Surgical 06/05/15 1356 06/08/15 1005       Patient was given sequential compression devices, early ambulation, and chemoprophylaxis to prevent DVT.  Patient benefited maximally from hospital stay and there were no complications.    Recent vital signs: Patient Vitals for the past 24 hrs:  BP Temp Temp src Pulse Resp SpO2  06/11/15 0605 (!) 108/46 mmHg 98.3 F (36.8 C) Core 60 16 100 %  06/10/15 2112 (!) 147/64 mmHg 98.6 F (37 C) Oral 63 18 100 %  06/10/15 1430 (!) 137/53 mmHg 98 F (36.7 C) - 61 18 99 %     Recent laboratory studies:  Recent Labs  06/10/15 0401 06/10/15 0732 06/11/15 0517  WBC 16.5*  --  9.1  HGB 13.8  --  11.8*  HCT 39.8  --  35.7*  PLT 73*  --  149*  NA  --  133* 136  K  --  4.6 4.0  CL  --  95* 97*  CO2  --  26 28  BUN  --  28* 24*   CREATININE  --  1.03 0.94  GLUCOSE  --  245* 138*  CALCIUM  --  8.9 8.6*     Discharge Medications:     Medication List    STOP taking these medications        HYDROcodone-acetaminophen 10-325 MG tablet  Commonly known as:  NORCO     Red Yeast Rice 600 MG Caps     traMADol 50 MG tablet  Commonly known as:  ULTRAM      TAKE these medications        albuterol 108 (90 Base) MCG/ACT inhaler  Commonly known as:  PROVENTIL HFA;VENTOLIN HFA  Inhale 2 puffs into the lungs every 6 (six) hours as needed for wheezing or shortness of breath.     aspirin 325 MG EC tablet  1 tab a day for the next 30 days to prevent blood clots     DSS 100 MG Caps  1 tab 2 times a day while on narcotics.  STOOL SOFTENER     furosemide 20 MG tablet  Commonly known as:  LASIX  TAKE 1 TABLET BY MOUTH TWICE A DAY AS NEEDED     levothyroxine 100 MCG tablet  Commonly known as:  SYNTHROID, LEVOTHROID  TAKE 1 TABLET BY MOUTH ONCE A DAY     metoCLOPramide 10 MG tablet  Commonly known as:  REGLAN  TAKE 1 TABLET BY MOUTH 3 TIMES DAILY BEFORE MEALS     metoprolol tartrate 25 MG tablet  Commonly known as:  LOPRESSOR  TAKE 1 TABLET BY MOUTH TWICE A DAY     multivitamin with minerals Tabs tablet  Take 1 tablet by mouth daily.     NEXIUM 40 MG capsule  Generic drug:  esomeprazole  TAKE 1 CAPSULE BY MOUTH TWICE DAILY     nitroGLYCERIN 0.4 MG SL tablet  Commonly known as:  NITROSTAT  Place 1 tablet (0.4 mg total) under the tongue every 5 (five) minutes as needed for chest pain.     omega-3 acid ethyl esters 1 g capsule  Commonly known as:  LOVAZA  Take one capsule in am and two at night     oxyCODONE 5 MG immediate release tablet  Commonly known as:  Oxy IR/ROXICODONE  1-2 tablets every 4-6 hrs as needed for breakthrough pain     oxyCODONE 20 mg 12 hr tablet  Commonly known as:  OXYCONTIN  1 po q 12 hrs for pain.  LONG ACTING PAIN MEDICATION     polyethylene glycol packet  Commonly known  as:  MIRALAX / GLYCOLAX  17grams in 16 oz of water twice a day until bowel movement.  LAXITIVE.  Restart if two days since last bowel movement     potassium chloride 10 MEQ tablet  Commonly known as:  K-DUR  TAKE 1 TABLET BY MOUTH TWICE A DAY AS NEEDED        Diagnostic Studies: No results found.  Disposition: 01-Home or Self Care      Discharge Instructions    CPM    Complete by:  As directed   Continuous passive motion machine (CPM):      Use the CPM from 0 to 90 for 6 hours per day.       You may break it up into 2 or 3 sessions per day.      Use CPM for 2 weeks or until you are told to stop.     Call MD / Call 911    Complete by:  As directed   If you experience chest pain or shortness of breath, CALL 911 and be transported to the hospital emergency room.  If you develope a fever above 101 F, pus (white drainage) or increased drainage or redness at the wound, or calf pain, call your surgeon's office.     Change dressing    Complete by:  As directed   Change the gauze dressing daily with sterile 4 x 4 inch gauze and apply TED hose.  DO NOT REMOVE BANDAGE OVER SURGICAL INCISION.  [place]ch WHOLE LEG INCLUDING OVER THE WATERPROOF BANDAGE WITH SOAP AND WATER EVERY DAY.     Constipation Prevention    Complete by:  As directed   Drink plenty of fluids.  Prune juice may be helpful.  You may use a stool softener, such as Colace (over the counter) 100 mg twice a day.  Use MiraLax (over the counter) for constipation as needed.     Diet - low sodium heart healthy    Complete by:  As directed      Discharge instructions    Complete by:  As directed   INSTRUCTIONS AFTER JOINT REPLACEMENT   Remove items at home which could result in a fall. This includes throw rugs or furniture in walking pathways ICE to the affected joint every three hours while awake for 30 minutes at a time, for at least the first 3-5 days, and then as needed for pain and swelling.  Continue to use ice for pain and  swelling. You may notice swelling that will progress down to the foot and ankle.  This is normal after surgery.  Elevate your leg when you are not up walking on it.   Continue to use the breathing machine you got in the hospital (incentive spirometer) which will help keep your temperature down.  It is common for your temperature to cycle up and down following surgery, especially at night when you are not up moving around and exerting yourself.  The breathing machine keeps your lungs expanded and your temperature down.   DIET:  As you were doing prior to hospitalization, we recommend a well-balanced diet.  DRESSING / WOUND CARE / SHOWERING  Keep the surgical dressing until follow up.  The dressing is water proof, so you can shower without any extra covering.  IF THE DRESSING FALLS OFF or the wound gets wet inside, change the dressing with sterile gauze.  Please use good hand washing techniques before changing the dressing.  Do not use any lotions or creams on the incision until instructed by  your surgeon.    ACTIVITY  Increase activity slowly as tolerated, but follow the weight bearing instructions below.   No driving for 6 weeks or until further direction given by your physician.  You cannot drive while taking narcotics.  No lifting or carrying greater than 10 lbs. until further directed by your surgeon. Avoid periods of inactivity such as sitting longer than an hour when not asleep. This helps prevent blood clots.  You may return to work once you are authorized by your doctor.     WEIGHT BEARING   Weight bearing as tolerated with assist device (walker, cane, etc) as directed, use it as long as suggested by your surgeon or therapist, typically at least 2-3 weeks.   EXERCISES  Results after joint replacement surgery are often greatly improved when you follow the exercise, range of motion and muscle strengthening exercises prescribed by your doctor. Safety measures are also important to  protect the joint from further injury. Any time any of these exercises cause you to have increased pain or swelling, decrease what you are doing until you are comfortable again and then slowly increase them. If you have problems or questions, call your caregiver or physical therapist for advice.   Rehabilitation is important following a joint replacement. After just a few days of immobilization, the muscles of the leg can become weakened and shrink (atrophy).  These exercises are designed to build up the tone and strength of the thigh and leg muscles and to improve motion. Often times heat used for twenty to thirty minutes before working out will loosen up your tissues and help with improving the range of motion but do not use heat for the first two weeks following surgery (sometimes heat can increase post-operative swelling).   These exercises can be done on a training (exercise) mat, on the floor, on a table or on a bed. Use whatever works the best and is most comfortable for you.    Use music or television while you are exercising so that the exercises are a pleasant break in your day. This will make your life better with the exercises acting as a break in your routine that you can look forward to.   Perform all exercises about fifteen times, three times per day or as directed.  You should exercise both the operative leg and the other leg as well.   Exercises include:  Quad Sets - Tighten up the muscle on the front of the thigh (Quad) and hold for 5-10 seconds.   Straight Leg Raises - With your knee straight (if you were given a brace, keep it on), lift the leg to 60 degrees, hold for 3 seconds, and slowly lower the leg.  Perform this exercise against resistance later as your leg gets stronger.  Leg Slides: Lying on your back, slowly slide your foot toward your buttocks, bending your knee up off the floor (only go as far as is comfortable). Then slowly slide your foot back down until your leg is flat on  the floor again.  Angel Wings: Lying on your back spread your legs to the side as far apart as you can without causing discomfort.  Hamstring Strength:  Lying on your back, push your heel against the floor with your leg straight by tightening up the muscles of your buttocks.  Repeat, but this time bend your knee to a comfortable angle, and push your heel against the floor.  You may put a pillow under the heel to make  it more comfortable if necessary.   A rehabilitation program following joint replacement surgery can speed recovery and prevent re-injury in the future due to weakened muscles. Contact your doctor or a physical therapist for more information on knee rehabilitation.    CONSTIPATION  Constipation is defined medically as fewer than three stools per week and severe constipation as less than one stool per week.  Even if you have a regular bowel pattern at home, your normal regimen is likely to be disrupted due to multiple reasons following surgery.  Combination of anesthesia, postoperative narcotics, change in appetite and fluid intake all can affect your bowels.   YOU MUST use at least one of the following options; they are listed in order of increasing strength to get the job done.  They are all available over the counter, and you may need to use some, POSSIBLY even all of these options:    Drink plenty of fluids (prune juice may be helpful) and high fiber foods Colace 100 mg by mouth twice a day  Senokot for constipation as directed and as needed Dulcolax (bisacodyl), take with full glass of water  Miralax (polyethylene glycol) once or twice a day as needed.  If you have tried all these things and are unable to have a bowel movement in the first 3-4 days after surgery call either your surgeon or your primary doctor.    If you experience loose stools or diarrhea, hold the medications until you stool forms back up.  If your symptoms do not get better within 1 week or if they get worse,  check with your doctor.  If you experience "the worst abdominal pain ever" or develop nausea or vomiting, please contact the office immediately for further recommendations for treatment.   ITCHING:  If you experience itching with your medications, try taking only a single pain pill, or even half a pain pill at a time.  You can also use Benadryl over the counter for itching or also to help with sleep.   TED HOSE STOCKINGS:  Use stockings on both legs until for at least 2 weeks or as directed by physician office. They may be removed at night for sleeping.  MEDICATIONS:  See your medication summary on the "After Visit Summary" that nursing will review with you.  You may have some home medications which will be placed on hold until you complete the course of blood thinner medication.  It is important for you to complete the blood thinner medication as prescribed.  PRECAUTIONS:  If you experience chest pain or shortness of breath - call 911 immediately for transfer to the hospital emergency department.   If you develop a fever greater that 101 F, purulent drainage from wound, increased redness or drainage from wound, foul odor from the wound/dressing, or calf pain - CONTACT YOUR SURGEON.                                                   FOLLOW-UP APPOINTMENTS:  If you do not already have a post-op appointment, please call the office for an appointment to be seen by your surgeon.  Guidelines for how soon to be seen are listed in your "After Visit Summary", but are typically between 1-4 weeks after surgery.  OTHER INSTRUCTIONS:   Knee Replacement:  Do not place pillow under knee, focus on  keeping the knee straight while resting. CPM instructions: 0-90 degrees, 2 hours in the morning, 2 hours in the afternoon, and 2 hours in the evening. Place foam block, curve side up under heel at all times except when in CPM or when walking.  DO NOT modify, tear, cut, or change the foam block in any way.  MAKE SURE  YOU:  Understand these instructions.  Get help right away if you are not doing well or get worse.    Thank you for letting us be a part of your medical care team.  It is a privilege we respect greatly.  We hope these instructions will help you stay on track for a fast and full recovery!     Do not put a pillow under the knee. Place it under the heel.    Complete by:  As directed   Place gray foam block, curve side up under heel at all times except when in CPM or when walking.  DO NOT modify, tear, cut, or change in any way the gray foam block.     Increase activity slowly as tolerated    Complete by:  As directed      Patient may shower    Complete by:  As directed   Aquacel dressing is water proof    Wash over it and the whole leg with soap and water at the end of your shower     TED hose    Complete by:  As directed   Use stockings (TED hose) for 2 weeks on both leg(s).  You may remove them at night for sleeping.           Follow-up Information    Follow up with [facility].   Why:  They will contact you to schedule home therapy visits.     Contact information:   [address] [place] [address] [phone]       Follow up with Lorn Junes, MD On 06/22/2015.   Specialty:  Orthopedic Surgery   Why:  as scheduled.   Contact information:   [address] [place] [address] [phone]        Signed: Linda Hedges 06/11/2015, 9:16 AM

## 2015-06-11 NOTE — Progress Notes (Signed)
Discharge instructions RX's and follow up appts  reviewed/provided to patient and family verbalized understanding. [facility] home [facility] set up, patient left floor via wheelchair accompanied by staff no c/o pain or shortness of breath at d/c.  Deidrea Gaetz, Tivis Ringer, RN

## 2015-06-11 NOTE — Care Management Important Message (Signed)
Important Message  Patient Details  Name: Chad [name] MRN: [id number] Date of Birth: November 28, 1955   Medicare Important Message Given:  Yes    [name] [name] 06/11/2015, 4:21 PM

## 2015-06-12 DIAGNOSIS — I5032 Chronic diastolic (congestive) heart failure: Secondary | ICD-10-CM | POA: Diagnosis not present

## 2015-06-12 DIAGNOSIS — Z471 Aftercare following joint replacement surgery: Secondary | ICD-10-CM | POA: Diagnosis not present

## 2015-06-12 DIAGNOSIS — M160 Bilateral primary osteoarthritis of hip: Secondary | ICD-10-CM | POA: Diagnosis not present

## 2015-06-12 DIAGNOSIS — I110 Hypertensive heart disease with heart failure: Secondary | ICD-10-CM | POA: Diagnosis not present

## 2015-06-12 DIAGNOSIS — E1143 Type 2 diabetes mellitus with diabetic autonomic (poly)neuropathy: Secondary | ICD-10-CM | POA: Diagnosis not present

## 2015-06-12 DIAGNOSIS — M1712 Unilateral primary osteoarthritis, left knee: Secondary | ICD-10-CM | POA: Diagnosis not present

## 2015-06-15 DIAGNOSIS — Z471 Aftercare following joint replacement surgery: Secondary | ICD-10-CM | POA: Diagnosis not present

## 2015-06-15 DIAGNOSIS — M1712 Unilateral primary osteoarthritis, left knee: Secondary | ICD-10-CM | POA: Diagnosis not present

## 2015-06-15 DIAGNOSIS — I110 Hypertensive heart disease with heart failure: Secondary | ICD-10-CM | POA: Diagnosis not present

## 2015-06-15 DIAGNOSIS — E1143 Type 2 diabetes mellitus with diabetic autonomic (poly)neuropathy: Secondary | ICD-10-CM | POA: Diagnosis not present

## 2015-06-15 DIAGNOSIS — M160 Bilateral primary osteoarthritis of hip: Secondary | ICD-10-CM | POA: Diagnosis not present

## 2015-06-15 DIAGNOSIS — I5032 Chronic diastolic (congestive) heart failure: Secondary | ICD-10-CM | POA: Diagnosis not present

## 2015-06-17 DIAGNOSIS — M1712 Unilateral primary osteoarthritis, left knee: Secondary | ICD-10-CM | POA: Diagnosis not present

## 2015-06-17 DIAGNOSIS — I5032 Chronic diastolic (congestive) heart failure: Secondary | ICD-10-CM | POA: Diagnosis not present

## 2015-06-17 DIAGNOSIS — Z471 Aftercare following joint replacement surgery: Secondary | ICD-10-CM | POA: Diagnosis not present

## 2015-06-17 DIAGNOSIS — I110 Hypertensive heart disease with heart failure: Secondary | ICD-10-CM | POA: Diagnosis not present

## 2015-06-17 DIAGNOSIS — M160 Bilateral primary osteoarthritis of hip: Secondary | ICD-10-CM | POA: Diagnosis not present

## 2015-06-17 DIAGNOSIS — E1143 Type 2 diabetes mellitus with diabetic autonomic (poly)neuropathy: Secondary | ICD-10-CM | POA: Diagnosis not present

## 2015-06-18 ENCOUNTER — Telehealth: Payer: Self-pay | Admitting: *Deleted

## 2015-06-18 DIAGNOSIS — I110 Hypertensive heart disease with heart failure: Secondary | ICD-10-CM | POA: Diagnosis not present

## 2015-06-18 DIAGNOSIS — M1712 Unilateral primary osteoarthritis, left knee: Secondary | ICD-10-CM | POA: Diagnosis not present

## 2015-06-18 DIAGNOSIS — Z471 Aftercare following joint replacement surgery: Secondary | ICD-10-CM | POA: Diagnosis not present

## 2015-06-18 DIAGNOSIS — M160 Bilateral primary osteoarthritis of hip: Secondary | ICD-10-CM | POA: Diagnosis not present

## 2015-06-18 DIAGNOSIS — E1143 Type 2 diabetes mellitus with diabetic autonomic (poly)neuropathy: Secondary | ICD-10-CM | POA: Diagnosis not present

## 2015-06-18 DIAGNOSIS — I5032 Chronic diastolic (congestive) heart failure: Secondary | ICD-10-CM | POA: Diagnosis not present

## 2015-06-18 NOTE — Telephone Encounter (Signed)
Dr Hilarie Fredrickson spoke with patient while patient was at a visit with his wife. Dr Hilarie Fredrickson advised patient that since he is on narcotics after recent knee surgery, he may use a bottle of magnesium citrate then use miralax 1-2 times daily while on narcotics.

## 2015-06-19 DIAGNOSIS — M1712 Unilateral primary osteoarthritis, left knee: Secondary | ICD-10-CM | POA: Diagnosis not present

## 2015-06-19 DIAGNOSIS — M160 Bilateral primary osteoarthritis of hip: Secondary | ICD-10-CM | POA: Diagnosis not present

## 2015-06-19 DIAGNOSIS — Z471 Aftercare following joint replacement surgery: Secondary | ICD-10-CM | POA: Diagnosis not present

## 2015-06-19 DIAGNOSIS — I110 Hypertensive heart disease with heart failure: Secondary | ICD-10-CM | POA: Diagnosis not present

## 2015-06-19 DIAGNOSIS — I5032 Chronic diastolic (congestive) heart failure: Secondary | ICD-10-CM | POA: Diagnosis not present

## 2015-06-19 DIAGNOSIS — E1143 Type 2 diabetes mellitus with diabetic autonomic (poly)neuropathy: Secondary | ICD-10-CM | POA: Diagnosis not present

## 2015-06-22 DIAGNOSIS — M1711 Unilateral primary osteoarthritis, right knee: Secondary | ICD-10-CM | POA: Diagnosis not present

## 2015-06-22 DIAGNOSIS — Z471 Aftercare following joint replacement surgery: Secondary | ICD-10-CM | POA: Diagnosis not present

## 2015-06-22 DIAGNOSIS — Z96651 Presence of right artificial knee joint: Secondary | ICD-10-CM | POA: Diagnosis not present

## 2015-06-22 DIAGNOSIS — M25661 Stiffness of right knee, not elsewhere classified: Secondary | ICD-10-CM | POA: Diagnosis not present

## 2015-06-23 DIAGNOSIS — M25561 Pain in right knee: Secondary | ICD-10-CM | POA: Diagnosis not present

## 2015-06-23 DIAGNOSIS — Z471 Aftercare following joint replacement surgery: Secondary | ICD-10-CM | POA: Diagnosis not present

## 2015-06-23 DIAGNOSIS — Z96651 Presence of right artificial knee joint: Secondary | ICD-10-CM | POA: Diagnosis not present

## 2015-06-23 DIAGNOSIS — M1711 Unilateral primary osteoarthritis, right knee: Secondary | ICD-10-CM | POA: Diagnosis not present

## 2015-06-24 ENCOUNTER — Other Ambulatory Visit: Payer: Self-pay | Admitting: Family Medicine

## 2015-06-25 DIAGNOSIS — M1711 Unilateral primary osteoarthritis, right knee: Secondary | ICD-10-CM | POA: Diagnosis not present

## 2015-06-25 DIAGNOSIS — M25661 Stiffness of right knee, not elsewhere classified: Secondary | ICD-10-CM | POA: Diagnosis not present

## 2015-06-25 DIAGNOSIS — Z96641 Presence of right artificial hip joint: Secondary | ICD-10-CM | POA: Diagnosis not present

## 2015-06-25 DIAGNOSIS — M25561 Pain in right knee: Secondary | ICD-10-CM | POA: Diagnosis not present

## 2015-06-29 DIAGNOSIS — Z96651 Presence of right artificial knee joint: Secondary | ICD-10-CM | POA: Diagnosis not present

## 2015-06-29 DIAGNOSIS — M25561 Pain in right knee: Secondary | ICD-10-CM | POA: Diagnosis not present

## 2015-06-29 DIAGNOSIS — M1711 Unilateral primary osteoarthritis, right knee: Secondary | ICD-10-CM | POA: Diagnosis not present

## 2015-06-29 DIAGNOSIS — M25661 Stiffness of right knee, not elsewhere classified: Secondary | ICD-10-CM | POA: Diagnosis not present

## 2015-07-01 DIAGNOSIS — M25561 Pain in right knee: Secondary | ICD-10-CM | POA: Diagnosis not present

## 2015-07-01 DIAGNOSIS — Z96651 Presence of right artificial knee joint: Secondary | ICD-10-CM | POA: Diagnosis not present

## 2015-07-01 DIAGNOSIS — M1711 Unilateral primary osteoarthritis, right knee: Secondary | ICD-10-CM | POA: Diagnosis not present

## 2015-07-01 DIAGNOSIS — Z471 Aftercare following joint replacement surgery: Secondary | ICD-10-CM | POA: Diagnosis not present

## 2015-07-06 DIAGNOSIS — M25661 Stiffness of right knee, not elsewhere classified: Secondary | ICD-10-CM | POA: Diagnosis not present

## 2015-07-06 DIAGNOSIS — Z96651 Presence of right artificial knee joint: Secondary | ICD-10-CM | POA: Diagnosis not present

## 2015-07-06 DIAGNOSIS — M1711 Unilateral primary osteoarthritis, right knee: Secondary | ICD-10-CM | POA: Diagnosis not present

## 2015-07-06 DIAGNOSIS — M25561 Pain in right knee: Secondary | ICD-10-CM | POA: Diagnosis not present

## 2015-07-07 ENCOUNTER — Other Ambulatory Visit: Payer: Self-pay | Admitting: Family Medicine

## 2015-07-08 DIAGNOSIS — M25561 Pain in right knee: Secondary | ICD-10-CM | POA: Diagnosis not present

## 2015-07-08 DIAGNOSIS — M25661 Stiffness of right knee, not elsewhere classified: Secondary | ICD-10-CM | POA: Diagnosis not present

## 2015-07-08 DIAGNOSIS — M1711 Unilateral primary osteoarthritis, right knee: Secondary | ICD-10-CM | POA: Diagnosis not present

## 2015-07-08 DIAGNOSIS — Z96651 Presence of right artificial knee joint: Secondary | ICD-10-CM | POA: Diagnosis not present

## 2015-07-13 ENCOUNTER — Encounter: Payer: Self-pay | Admitting: Family Medicine

## 2015-07-13 ENCOUNTER — Ambulatory Visit (INDEPENDENT_AMBULATORY_CARE_PROVIDER_SITE_OTHER): Payer: Medicare Other | Admitting: Family Medicine

## 2015-07-13 VITALS — BP 128/74 | HR 62 | Temp 98.3°F | Ht 69.0 in | Wt 320.0 lb

## 2015-07-13 DIAGNOSIS — I2510 Atherosclerotic heart disease of native coronary artery without angina pectoris: Secondary | ICD-10-CM | POA: Diagnosis not present

## 2015-07-13 DIAGNOSIS — R1030 Lower abdominal pain, unspecified: Secondary | ICD-10-CM | POA: Diagnosis not present

## 2015-07-13 LAB — CBC WITH DIFFERENTIAL/PLATELET
BASOS PCT: 0.3 % (ref 0.0–3.0)
Basophils Absolute: 0 10*3/uL (ref 0.0–0.1)
EOS PCT: 2 % (ref 0.0–5.0)
Eosinophils Absolute: 0.1 10*3/uL (ref 0.0–0.7)
HEMATOCRIT: 41.3 % (ref 39.0–52.0)
HEMOGLOBIN: 13.9 g/dL (ref 13.0–17.0)
LYMPHS PCT: 14.6 % (ref 12.0–46.0)
Lymphs Abs: 0.9 10*3/uL (ref 0.7–4.0)
MCHC: 33.6 g/dL (ref 30.0–36.0)
MCV: 84.2 fl (ref 78.0–100.0)
MONOS PCT: 9.2 % (ref 3.0–12.0)
Monocytes Absolute: 0.6 10*3/uL (ref 0.1–1.0)
Neutro Abs: 4.7 10*3/uL (ref 1.4–7.7)
Neutrophils Relative %: 73.9 % (ref 43.0–77.0)
Platelets: 180 10*3/uL (ref 150.0–400.0)
RBC: 4.91 Mil/uL (ref 4.22–5.81)
RDW: 14.4 % (ref 11.5–15.5)
WBC: 6.4 10*3/uL (ref 4.0–10.5)

## 2015-07-13 LAB — COMPREHENSIVE METABOLIC PANEL
ALBUMIN: 4.1 g/dL (ref 3.5–5.2)
ALK PHOS: 105 U/L (ref 39–117)
ALT: 23 U/L (ref 0–53)
AST: 25 U/L (ref 0–37)
BUN: 15 mg/dL (ref 6–23)
CALCIUM: 9.1 mg/dL (ref 8.4–10.5)
CHLORIDE: 102 meq/L (ref 96–112)
CO2: 28 mEq/L (ref 19–32)
CREATININE: 0.93 mg/dL (ref 0.40–1.50)
GFR: 87.98 mL/min (ref >60.00)
Glucose, Bld: 110 mg/dL — ABNORMAL HIGH (ref 70–99)
POTASSIUM: 3.8 meq/L (ref 3.5–5.1)
Sodium: 138 mEq/L (ref 135–145)
TOTAL PROTEIN: 6.3 g/dL (ref 6.0–8.3)
Total Bilirubin: 0.8 mg/dL (ref 0.2–1.2)

## 2015-07-13 LAB — LIPASE: LIPASE: 46 U/L (ref 11.0–59.0)

## 2015-07-13 MED ORDER — AMOXICILLIN-POT CLAVULANATE 875-125 MG PO TABS: 1.0000 | ORAL_TABLET | Freq: Two times a day (BID) | ORAL | Status: AC
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
## 2015-07-13 NOTE — Assessment & Plan Note (Signed)
Lower abd pain with bowel changes in h/o diverticulitis - concern for rpt. Treat with augmentin 10d course Check labs today. Discussed clear liquid diet. Low threshold to obtain CT scan - advised pt to update me if worsening or no improvement noted after treatment.

## 2015-07-13 NOTE — Patient Instructions (Addendum)
Blood work today Start augmentin.  Clear liquid diet for next 1-2 days (chicken broth, jello, ginger ale) then bland diet (banana, applesauce, toast, rice, chicken noodles).  If not improving with treatment, let us know right away for CT scan. If worsening, seek urgent care.

## 2015-07-13 NOTE — Progress Notes (Signed)
Pre visit review using our clinic review tool, if applicable. No additional management support is needed unless otherwise documented below in the visit note. 

## 2015-07-14 DIAGNOSIS — M25661 Stiffness of right knee, not elsewhere classified: Secondary | ICD-10-CM | POA: Diagnosis not present

## 2015-07-14 DIAGNOSIS — M25561 Pain in right knee: Secondary | ICD-10-CM | POA: Diagnosis not present

## 2015-07-14 DIAGNOSIS — Z96651 Presence of right artificial knee joint: Secondary | ICD-10-CM | POA: Diagnosis not present

## 2015-07-14 DIAGNOSIS — M1711 Unilateral primary osteoarthritis, right knee: Secondary | ICD-10-CM | POA: Diagnosis not present

## 2015-07-16 DIAGNOSIS — M25561 Pain in right knee: Secondary | ICD-10-CM | POA: Diagnosis not present

## 2015-07-16 DIAGNOSIS — Z96651 Presence of right artificial knee joint: Secondary | ICD-10-CM | POA: Diagnosis not present

## 2015-07-16 DIAGNOSIS — M1711 Unilateral primary osteoarthritis, right knee: Secondary | ICD-10-CM | POA: Diagnosis not present

## 2015-07-16 DIAGNOSIS — Z471 Aftercare following joint replacement surgery: Secondary | ICD-10-CM | POA: Diagnosis not present

## 2015-07-21 ENCOUNTER — Telehealth: Payer: Self-pay | Admitting: Internal Medicine

## 2015-07-21 DIAGNOSIS — Z471 Aftercare following joint replacement surgery: Secondary | ICD-10-CM | POA: Diagnosis not present

## 2015-07-21 DIAGNOSIS — Z96651 Presence of right artificial knee joint: Secondary | ICD-10-CM | POA: Diagnosis not present

## 2015-07-21 DIAGNOSIS — K5732 Diverticulitis of large intestine without perforation or abscess without bleeding: Secondary | ICD-10-CM

## 2015-07-21 DIAGNOSIS — M25661 Stiffness of right knee, not elsewhere classified: Secondary | ICD-10-CM | POA: Diagnosis not present

## 2015-07-21 DIAGNOSIS — R197 Diarrhea, unspecified: Secondary | ICD-10-CM

## 2015-07-21 DIAGNOSIS — M1711 Unilateral primary osteoarthritis, right knee: Secondary | ICD-10-CM | POA: Diagnosis not present

## 2015-07-21 NOTE — Telephone Encounter (Signed)
Patient with history of diverticulitis Treated with Augmentin 10 days Given that he is not improving, would recommend CT scan abdomen and pelvis with contrast C. difficile PCR, order stat please

## 2015-07-21 NOTE — Telephone Encounter (Signed)
Has generalized abd pain x 1 month, Saw PCP 1 week ago and was put on augmentin and will finish the course on Thursday, but pain has worsened.  Feels like this is a diverticulitis flare. Has 2-3 watery stools daily.  Do you want me to get him in with Jess or Amy or send another antibiotic?  Labs normal on 07/13/15

## 2015-07-21 NOTE — Telephone Encounter (Signed)
You have been scheduled for a CT scan of the abdomen and pelvis at [facility] ([address]---this is in the same building as Press [facility]).   You are scheduled on 07/22/15 at 945 am. You should arrive 15 minutes prior to your appointment time for registration. Please follow the written instructions below on the day of your exam:  WARNING: IF YOU ARE ALLERGIC TO IODINE/X-RAY DYE, PLEASE NOTIFY RADIOLOGY IMMEDIATELY AT [phone]! YOU WILL BE GIVEN A 13 HOUR PREMEDICATION PREP.  1) Do not eat or drink anything after 545 am (4 hours prior to your test) 2) You have been given 2 bottles of oral contrast to drink. The solution may taste better if refrigerated, but do NOT add ice or any other liquid to this solution. Shake well before drinking.    Drink 1 bottle of contrast @ 745 am (2 hours prior to your exam)  Drink 1 bottle of contrast @ 845 am (1 hour prior to your exam)  You may take any medications as prescribed with a small amount of water except for the following: Metformin, Glucophage, Glucovance, Avandamet, Riomet, Fortamet, Actoplus Met, Janumet, Glumetza or Metaglip. The above medications must be held the day of the exam AND 48 hours after the exam.  The purpose of you drinking the oral contrast is to aid in the visualization of your intestinal tract. The contrast solution may cause some diarrhea. Before your exam is started, you will be given a small amount of fluid to drink. Depending on your individual set of symptoms, you may also receive an intravenous injection of x-ray contrast/dye. Plan on being at [facility] for 30 minutes or longer, depending on the type of exam you are having performed.  This test typically takes 30-45 minutes to complete.  If you have any questions regarding your exam or if you need to reschedule, you may call the CT department at [phone] between the hours of 8:00 am and 5:00 pm,  Monday-Friday.  ________________________________________________________________________ The patient has been notified of this information and all questions answered. He has been instructed and will come by today and pick up contrast and instructions and stool kit for C diff

## 2015-07-22 ENCOUNTER — Ambulatory Visit (INDEPENDENT_AMBULATORY_CARE_PROVIDER_SITE_OTHER)
Admission: RE | Admit: 2015-07-22 | Discharge: 2015-07-22 | Disposition: A | Discharge status: Home / Self Care | Payer: Medicare Other | Source: Ambulatory Visit | Attending: Internal Medicine | Admitting: Internal Medicine

## 2015-07-22 ENCOUNTER — Other Ambulatory Visit: Payer: Medicare Other

## 2015-07-22 DIAGNOSIS — K5732 Diverticulitis of large intestine without perforation or abscess without bleeding: Secondary | ICD-10-CM

## 2015-07-22 DIAGNOSIS — K5792 Diverticulitis of intestine, part unspecified, without perforation or abscess without bleeding: Secondary | ICD-10-CM | POA: Diagnosis not present

## 2015-07-22 DIAGNOSIS — R1030 Lower abdominal pain, unspecified: Secondary | ICD-10-CM | POA: Diagnosis not present

## 2015-07-22 DIAGNOSIS — R197 Diarrhea, unspecified: Secondary | ICD-10-CM

## 2015-07-22 MED ORDER — IOPAMIDOL (ISOVUE-300) INJECTION 61%
100.0000 mL | Freq: Once | INTRAVENOUS | Status: AC | PRN
  Administered 2015-07-22: 100 mL via INTRAVENOUS

## 2015-07-23 LAB — CLOSTRIDIUM DIFFICILE BY PCR: CDIFFPCR: NOT DETECTED

## 2015-07-24 ENCOUNTER — Encounter: Payer: Self-pay | Admitting: *Deleted

## 2015-07-28 ENCOUNTER — Telehealth: Payer: Self-pay | Admitting: Family Medicine

## 2015-07-28 NOTE — Telephone Encounter (Addendum)
Ongoing pain /diarrhea.  See my OV for details.

## 2015-07-30 ENCOUNTER — Encounter: Payer: Self-pay | Admitting: *Deleted

## 2015-07-30 ENCOUNTER — Emergency Department
Admission: EM | Admit: 2015-07-30 | Discharge: 2015-07-30 | Disposition: A | Discharge status: Home / Self Care | Payer: Medicare Other | Attending: Emergency Medicine | Admitting: Emergency Medicine

## 2015-07-30 DIAGNOSIS — R1030 Lower abdominal pain, unspecified: Secondary | ICD-10-CM | POA: Diagnosis not present

## 2015-07-30 DIAGNOSIS — R109 Unspecified abdominal pain: Secondary | ICD-10-CM

## 2015-07-30 DIAGNOSIS — Z9884 Bariatric surgery status: Secondary | ICD-10-CM | POA: Diagnosis not present

## 2015-07-30 DIAGNOSIS — E039 Hypothyroidism, unspecified: Secondary | ICD-10-CM | POA: Diagnosis not present

## 2015-07-30 DIAGNOSIS — Z7982 Long term (current) use of aspirin: Secondary | ICD-10-CM | POA: Insufficient documentation

## 2015-07-30 DIAGNOSIS — E785 Hyperlipidemia, unspecified: Secondary | ICD-10-CM | POA: Diagnosis not present

## 2015-07-30 DIAGNOSIS — I110 Hypertensive heart disease with heart failure: Secondary | ICD-10-CM | POA: Diagnosis not present

## 2015-07-30 DIAGNOSIS — M1711 Unilateral primary osteoarthritis, right knee: Secondary | ICD-10-CM | POA: Diagnosis not present

## 2015-07-30 DIAGNOSIS — Z6841 Body Mass Index (BMI) 40.0 and over, adult: Secondary | ICD-10-CM | POA: Diagnosis not present

## 2015-07-30 DIAGNOSIS — I2510 Atherosclerotic heart disease of native coronary artery without angina pectoris: Secondary | ICD-10-CM | POA: Insufficient documentation

## 2015-07-30 DIAGNOSIS — I5032 Chronic diastolic (congestive) heart failure: Secondary | ICD-10-CM | POA: Insufficient documentation

## 2015-07-30 DIAGNOSIS — E119 Type 2 diabetes mellitus without complications: Secondary | ICD-10-CM | POA: Insufficient documentation

## 2015-07-30 DIAGNOSIS — Z79899 Other long term (current) drug therapy: Secondary | ICD-10-CM | POA: Insufficient documentation

## 2015-07-30 DIAGNOSIS — M1990 Unspecified osteoarthritis, unspecified site: Secondary | ICD-10-CM | POA: Insufficient documentation

## 2015-07-30 LAB — COMPREHENSIVE METABOLIC PANEL
ALK PHOS: 128 U/L — AB (ref 38–126)
ALT: 34 U/L (ref 17–63)
AST: 36 U/L (ref 15–41)
Albumin: 4 g/dL (ref 3.5–5.0)
Anion gap: 7 (ref 5–15)
BILIRUBIN TOTAL: 1.1 mg/dL (ref 0.3–1.2)
BUN: 14 mg/dL (ref 6–20)
CALCIUM: 9.1 mg/dL (ref 8.9–10.3)
CHLORIDE: 103 mmol/L (ref 101–111)
CO2: 25 mmol/L (ref 22–32)
CREATININE: 0.91 mg/dL (ref 0.61–1.24)
Glucose, Bld: 225 mg/dL — ABNORMAL HIGH (ref 65–99)
Potassium: 3.8 mmol/L (ref 3.5–5.1)
Sodium: 135 mmol/L (ref 135–145)
TOTAL PROTEIN: 6.3 g/dL — AB (ref 6.5–8.1)

## 2015-07-30 LAB — URINALYSIS COMPLETE WITH MICROSCOPIC (ARMC ONLY)
BILIRUBIN URINE: NEGATIVE
Bacteria, UA: NONE SEEN
GLUCOSE, UA: NEGATIVE mg/dL
HGB URINE DIPSTICK: NEGATIVE
KETONES UR: NEGATIVE mg/dL
LEUKOCYTES UA: NEGATIVE
Nitrite: NEGATIVE
Protein, ur: NEGATIVE mg/dL
RBC / HPF: NONE SEEN RBC/hpf (ref 0–5)
SQUAMOUS EPITHELIAL / LPF: NONE SEEN
Specific Gravity, Urine: 1.005 (ref 1.005–1.030)
pH: 6 (ref 5.0–8.0)

## 2015-07-30 LAB — LIPASE, BLOOD: Lipase: 28 U/L (ref 11–51)

## 2015-07-30 LAB — CBC
HCT: 41.1 % (ref 40.0–52.0)
Hemoglobin: 13.9 g/dL (ref 13.0–18.0)
MCH: 27.5 pg (ref 26.0–34.0)
MCHC: 33.8 g/dL (ref 32.0–36.0)
MCV: 81.5 fL (ref 80.0–100.0)
PLATELETS: 122 10*3/uL — AB (ref 150–440)
RBC: 5.04 MIL/uL (ref 4.40–5.90)
RDW: 13.8 % (ref 11.5–14.5)
WBC: 5.8 10*3/uL (ref 3.8–10.6)

## 2015-07-30 MED ORDER — DICYCLOMINE HCL 10 MG/ML IM SOLN: 20.0000 mg | Freq: Once | INTRAMUSCULAR | Status: DC

## 2015-07-30 MED ORDER — DICYCLOMINE HCL 20 MG PO TABS: 20.0000 mg | ORAL_TABLET | Freq: Three times a day (TID) | ORAL | Status: DC | PRN

## 2015-07-30 NOTE — Discharge Instructions (Signed)
Abdominal Pain, Adult Many things can cause abdominal pain. Usually, abdominal pain is not caused by a disease and will improve without treatment. It can often be observed and treated at home. Your health care provider will do a physical exam and possibly order blood tests and X-rays to help determine the seriousness of your pain. However, in many cases, more time must pass before a clear cause of the pain can be found. Before that point, your health care provider may not know if you need more testing or further treatment. HOME CARE INSTRUCTIONS Monitor your abdominal pain for any changes. The following actions may help to alleviate any discomfort you are experiencing:  Only take over-the-counter or prescription medicines as directed by your health care provider.  Do not take laxatives unless directed to do so by your health care provider.  Try a clear liquid diet (broth, tea, or water) as directed by your health care provider. Slowly move to a bland diet as tolerated. SEEK MEDICAL CARE IF:  You have unexplained abdominal pain.  You have abdominal pain associated with nausea or diarrhea.  You have pain when you urinate or have a bowel movement.  You experience abdominal pain that wakes you in the night.  You have abdominal pain that is worsened or improved by eating food.  You have abdominal pain that is worsened with eating fatty foods.  You have a fever. SEEK IMMEDIATE MEDICAL CARE IF:  Your pain does not go away within 2 hours.  You keep throwing up (vomiting).  Your pain is felt only in portions of the abdomen, such as the right side or the left lower portion of the abdomen.  You pass bloody or black tarry stools. MAKE SURE YOU:  Understand these instructions.  Will watch your condition.  Will get help right away if you are not doing well or get worse.   This information is not intended to replace advice given to you by your health care provider. Make sure you discuss  any questions you have with your health care provider.   Document Released: 11/17/2004 Document Revised: 10/29/2014 Document Reviewed: 10/17/2012 Elsevier Interactive Patient Education 2016 [employer]. Please follow-up with your gastroenterologist. You can try the Bentyl to see if that helps with the pain. Please return for worse pain fever vomiting. You can try Pepto-Bismol for the diarrhea. Remember that can make your stool black. He can also give you constipation. If you're having constipation alternating with diarrhea I would not use the Pepto-Bismol for the diarrhea.

## 2015-07-30 NOTE — ED Notes (Signed)
MD Malinda at bedside.

## 2015-07-30 NOTE — ED Notes (Signed)
Pt has been seeing his PCP for lower abdominal pain, pt had CT last week, pt reports pain is worse today, pt instructed to be seen in ED per PCP, pt reports intermittent diarrhea
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
## 2015-07-30 NOTE — ED Notes (Signed)
Pt instructed will have to be roomed into hallway due to overwhelming number of pysch pt's at this time. Pt states does not want to be placed in hallway, would rather just leave. This RN spoke to MD [facility] who verbalized pt okay to be d.c at this time. Pt educated on d.c and pros and cons of leaving at this time. Pt states does not want injection of bentyl, will go home "and take a pain pill" RN verbalized understanding and proceeding with d.c at this time.

## 2015-08-03 ENCOUNTER — Encounter: Payer: Self-pay | Admitting: Internal Medicine

## 2015-08-03 ENCOUNTER — Other Ambulatory Visit (INDEPENDENT_AMBULATORY_CARE_PROVIDER_SITE_OTHER): Payer: Medicare Other

## 2015-08-03 ENCOUNTER — Ambulatory Visit (INDEPENDENT_AMBULATORY_CARE_PROVIDER_SITE_OTHER): Payer: Medicare Other | Admitting: Family Medicine

## 2015-08-03 ENCOUNTER — Ambulatory Visit (INDEPENDENT_AMBULATORY_CARE_PROVIDER_SITE_OTHER): Payer: Medicare Other | Admitting: Internal Medicine

## 2015-08-03 ENCOUNTER — Encounter: Payer: Self-pay | Admitting: Family Medicine

## 2015-08-03 ENCOUNTER — Ambulatory Visit (INDEPENDENT_AMBULATORY_CARE_PROVIDER_SITE_OTHER)
Admission: RE | Admit: 2015-08-03 | Discharge: 2015-08-03 | Disposition: A | Discharge status: Home / Self Care | Payer: Medicare Other | Source: Ambulatory Visit | Attending: Internal Medicine | Admitting: Internal Medicine

## 2015-08-03 VITALS — BP 142/82 | HR 80 | Temp 97.6°F | Wt 322.2 lb

## 2015-08-03 VITALS — BP 134/76 | HR 64 | Ht 70.0 in | Wt 320.5 lb

## 2015-08-03 DIAGNOSIS — I2510 Atherosclerotic heart disease of native coronary artery without angina pectoris: Secondary | ICD-10-CM

## 2015-08-03 DIAGNOSIS — R109 Unspecified abdominal pain: Secondary | ICD-10-CM

## 2015-08-03 DIAGNOSIS — R140 Abdominal distension (gaseous): Secondary | ICD-10-CM | POA: Diagnosis not present

## 2015-08-03 DIAGNOSIS — R1084 Generalized abdominal pain: Secondary | ICD-10-CM | POA: Diagnosis not present

## 2015-08-03 DIAGNOSIS — R161 Splenomegaly, not elsewhere classified: Secondary | ICD-10-CM

## 2015-08-03 DIAGNOSIS — Z79899 Other long term (current) drug therapy: Secondary | ICD-10-CM | POA: Diagnosis not present

## 2015-08-03 DIAGNOSIS — R635 Abnormal weight gain: Secondary | ICD-10-CM

## 2015-08-03 DIAGNOSIS — R932 Abnormal findings on diagnostic imaging of liver and biliary tract: Secondary | ICD-10-CM

## 2015-08-03 DIAGNOSIS — K5730 Diverticulosis of large intestine without perforation or abscess without bleeding: Secondary | ICD-10-CM | POA: Diagnosis not present

## 2015-08-03 LAB — COMPREHENSIVE METABOLIC PANEL
ALK PHOS: 126 U/L — AB (ref 39–117)
ALT: 29 U/L (ref 0–53)
AST: 31 U/L (ref 0–37)
Albumin: 4.2 g/dL (ref 3.5–5.2)
BUN: 17 mg/dL (ref 6–23)
CHLORIDE: 102 meq/L (ref 96–112)
CO2: 27 meq/L (ref 19–32)
Calcium: 9 mg/dL (ref 8.4–10.5)
Creatinine, Ser: 0.88 mg/dL (ref 0.40–1.50)
GFR: 93.75 mL/min (ref >60.00)
GLUCOSE: 135 mg/dL — AB (ref 70–99)
POTASSIUM: 3.8 meq/L (ref 3.5–5.1)
SODIUM: 136 meq/L (ref 135–145)
TOTAL PROTEIN: 6.6 g/dL (ref 6.0–8.3)
Total Bilirubin: 0.9 mg/dL (ref 0.2–1.2)

## 2015-08-03 LAB — CBC WITH DIFFERENTIAL/PLATELET
Basophils Absolute: 0 10*3/uL (ref 0.0–0.1)
Basophils Relative: 0.2 % (ref 0.0–3.0)
EOS PCT: 1.6 % (ref 0.0–5.0)
Eosinophils Absolute: 0.1 10*3/uL (ref 0.0–0.7)
HCT: 42.6 % (ref 39.0–52.0)
Hemoglobin: 14.2 g/dL (ref 13.0–17.0)
LYMPHS ABS: 0.8 10*3/uL (ref 0.7–4.0)
Lymphocytes Relative: 12.4 % (ref 12.0–46.0)
MCHC: 33.3 g/dL (ref 30.0–36.0)
MCV: 82.6 fl (ref 78.0–100.0)
MONO ABS: 0.5 10*3/uL (ref 0.1–1.0)
Monocytes Relative: 8 % (ref 3.0–12.0)
NEUTROS PCT: 77.8 % — AB (ref 43.0–77.0)
Neutro Abs: 5.2 10*3/uL (ref 1.4–7.7)
Platelets: 143 10*3/uL — ABNORMAL LOW (ref 150.0–400.0)
RBC: 5.15 Mil/uL (ref 4.22–5.81)
RDW: 14.1 % (ref 11.5–15.5)
WBC: 6.7 10*3/uL (ref 4.0–10.5)

## 2015-08-03 LAB — PROTIME-INR
INR: 1.2 ratio — ABNORMAL HIGH (ref 0.8–1.0)
Prothrombin Time: 12.1 s (ref 9.6–13.1)

## 2015-08-03 LAB — IBC PANEL
Iron: 59 ug/dL (ref 42–165)
Saturation Ratios: 15.6 % — ABNORMAL LOW (ref 20.0–50.0)
Transferrin: 270 mg/dL (ref 212.0–360.0)

## 2015-08-03 LAB — LIPASE: LIPASE: 27 U/L (ref 11.0–59.0)

## 2015-08-03 LAB — SEDIMENTATION RATE: SED RATE: 9 mm/h (ref 0–20)

## 2015-08-03 LAB — AMYLASE: AMYLASE: 47 U/L (ref 27–131)

## 2015-08-03 MED ORDER — HYDROCODONE-ACETAMINOPHEN 5-325 MG PO TABS: 1.0000 | ORAL_TABLET | Freq: Four times a day (QID) | ORAL | Status: DC | PRN

## 2015-08-03 MED ORDER — IOPAMIDOL (ISOVUE-300) INJECTION 61%
100.0000 mL | Freq: Once | INTRAVENOUS | Status: AC | PRN
  Administered 2015-08-03: 100 mL via INTRAVENOUS

## 2015-08-03 MED ORDER — DIAZEPAM 5 MG PO TABS: 5.0000 mg | ORAL_TABLET | Freq: Two times a day (BID) | ORAL | Status: DC | PRN

## 2015-08-03 MED ORDER — DICYCLOMINE HCL 20 MG PO TABS: 20.0000 mg | ORAL_TABLET | Freq: Three times a day (TID) | ORAL | Status: DC | PRN

## 2015-08-03 NOTE — Progress Notes (Signed)
BP 142/82 mmHg  Pulse 80  Temp(Src) 97.6 F (36.4 C) (Oral)  Wt 322 lb 4 oz (146.172 kg)   CC: f/u abd pain  Subjective:    Patient ID: Chad [name], male    DOB: 22-Sep-1955, 60 y.o.   MRN: [id number]  HPI: Chad [name] is a 60 y.o. male presenting on 08/03/2015 for Follow-up and Abdominal Pain   Known GERD, chronic gastroparesis, morbid obesity s/p likely sleeve gastrectomy s/p cholecystectomy, h/o diverticulitis in the past. Latest lower abd pain ongoing for last 3 weeks - treated presumptively with augmentin 10d abx course for recurrent diverticulitis without improvement. Describes sharp stinging burning pain bilateral abdomen with radiation throughout abdomen. Associated with nausea and loss of appetite, no vomiting, no diarrhea, constipation, no urinary changes. No dysphagia. Pain seems to be worsening over weekend - alleviated with laying in bed, aggravated with walking. No heartburn. activia hasn't helped.   Recent ER visit over weekend - records reviewed. Unrevealing work up for worsening abdominal pain including CBC, CMP, UA.   He continues regular nexium 62m twice daily, reglan 15mprior to meals.   COLONOSCOPY Date: 12/2014 TAs, mod diverticulosis, rpt 3 yrs ([facility])  Upcoming appt 11am with GI Dr PyHilarie Fredrickson Last filled hydrocodone 04/2015.   Relevant past medical, surgical, family and social history reviewed and updated as indicated. Interim medical history since our last visit reviewed. Allergies and medications reviewed and updated. Current Outpatient Prescriptions on File Prior to Visit  Medication Sig  . aspirin 81 MG tablet Take two by mouth daily  . Docusate Sodium (DSS) 100 MG CAPS 1 tab 2 times a day while on narcotics.  STOOL SOFTENER  . furosemide (LASIX) 20 MG tablet TAKE 1 TABLET BY MOUTH TWICE A DAY AS NEEDED  . levothyroxine (SYNTHROID, LEVOTHROID) 100 MCG tablet TAKE 1 TABLET BY MOUTH ONCE A DAY  . metoCLOPramide (REGLAN) 10 MG tablet TAKE 1 TABLET BY  MOUTH 3 TIMES DAILY BEFORE MEALS  . metoprolol tartrate (LOPRESSOR) 25 MG tablet TAKE 1 TABLET BY MOUTH TWICE A DAY  . Multiple Vitamin (MULITIVITAMIN WITH MINERALS) TABS Take 1 tablet by mouth daily.  . Marland KitchenEXIUM 40 MG capsule TAKE 1 CAPSULE BY MOUTH TWICE DAILY  . nitroGLYCERIN (NITROSTAT) 0.4 MG SL tablet Place 1 tablet (0.4 mg total) under the tongue every 5 (five) minutes as needed for chest pain.  . Marland Kitchenmega-3 acid ethyl esters (LOVAZA) 1 g capsule Take one capsule in am and two at night  . polyethylene glycol (MIRALAX / GLYCOLAX) packet 17grams in 16 oz of water twice a day until bowel movement.  LAXITIVE.  Restart if two days since last bowel movement  . potassium chloride (K-DUR) 10 MEQ tablet TAKE 1 TABLET BY MOUTH TWICE A DAY AS NEEDED  . VENTOLIN HFA 108 (90 Base) MCG/ACT inhaler INHALE 2 PUFFS INTO THE LUNGS EVERY 6 HOURS AS NEEDED FOR WHEEZING ORSHORTNESS OF BREATH   No current facility-administered medications on file prior to visit.    Review of Systems Per HPI unless specifically indicated in ROS section     Objective:    BP 142/82 mmHg  Pulse 80  Temp(Src) 97.6 F (36.4 C) (Oral)  Wt 322 lb 4 oz (146.172 kg)  Wt Readings from Last 3 Encounters:  08/03/15 322 lb 4 oz (146.172 kg)  07/30/15 315 lb (142.883 kg)  07/13/15 320 lb (145.151 kg)    Physical Exam  Constitutional: He appears well-developed and well-nourished. No distress.  HENT:  Mouth/Throat: Oropharynx is clear and moist. No oropharyngeal exudate.  Cardiovascular: Normal rate, regular rhythm, normal heart sounds and intact distal pulses.   No murmur heard. Pulmonary/Chest: Effort normal and breath sounds normal. No respiratory distress. He has no wheezes. He has no rales.  Abdominal: Soft. Bowel sounds are normal. He exhibits no distension and no mass. There is generalized tenderness (predominantly LLQ). There is guarding. There is no rigidity, no rebound, no CVA tenderness and negative Murphy's sign.  Marked  discomfort to palpation throughout abdomen  Musculoskeletal: He exhibits no edema.  Skin: Skin is warm and dry. No rash noted.  Nursing note and vitals reviewed.  Results for orders placed or performed during the hospital encounter of 07/30/15  Lipase, blood  Result Value Ref Range   Lipase 28 11 - 51 U/L  Comprehensive metabolic panel  Result Value Ref Range   Sodium 135 135 - 145 mmol/L   Potassium 3.8 3.5 - 5.1 mmol/L   Chloride 103 101 - 111 mmol/L   CO2 25 22 - 32 mmol/L   Glucose, Bld 225 (H) 65 - 99 mg/dL   BUN 14 6 - 20 mg/dL   Creatinine, Ser 0.91 0.61 - 1.24 mg/dL   Calcium 9.1 8.9 - 10.3 mg/dL   Total Protein 6.3 (L) 6.5 - 8.1 g/dL   Albumin 4.0 3.5 - 5.0 g/dL   AST 36 15 - 41 U/L   ALT 34 17 - 63 U/L   Alkaline Phosphatase 128 (H) 38 - 126 U/L   Total Bilirubin 1.1 0.3 - 1.2 mg/dL   GFR calc non Af Amer >60 >60 mL/min   GFR calc Af Amer >60 >60 mL/min   Anion gap 7 5 - 15  CBC  Result Value Ref Range   WBC 5.8 3.8 - 10.6 K/uL   RBC 5.04 4.40 - 5.90 MIL/uL   Hemoglobin 13.9 13.0 - 18.0 g/dL   HCT 41.1 40.0 - 52.0 %   MCV 81.5 80.0 - 100.0 fL   MCH 27.5 26.0 - 34.0 pg   MCHC 33.8 32.0 - 36.0 g/dL   RDW 13.8 11.5 - 14.5 %   Platelets 122 (L) 150 - 440 K/uL  Urinalysis complete, with microscopic  Result Value Ref Range   Color, Urine YELLOW (A) YELLOW   APPearance CLEAR (A) CLEAR   Glucose, UA NEGATIVE NEGATIVE mg/dL   Bilirubin Urine NEGATIVE NEGATIVE   Ketones, ur NEGATIVE NEGATIVE mg/dL   Specific Gravity, Urine 1.005 1.005 - 1.030   Hgb urine dipstick NEGATIVE NEGATIVE   pH 6.0 5.0 - 8.0   Protein, ur NEGATIVE NEGATIVE mg/dL   Nitrite NEGATIVE NEGATIVE   Leukocytes, UA NEGATIVE NEGATIVE   RBC / HPF NONE SEEN 0 - 5 RBC/hpf   WBC, UA 0-5 0 - 5 WBC/hpf   Bacteria, UA NONE SEEN NONE SEEN   Squamous Epithelial / LPF NONE SEEN NONE SEEN   Mucous PRESENT    CT ABDOMEN AND PELVIS WITH CONTRAST IMPRESSION: 1. Morphologic features a liver compatible with  cirrhosis. 2. No acute findings identified within the abdomen or pelvis. 3. Sigmoid diverticula without acute inflammation 4. Splenomegaly. Electronically Signed  By: Kerby Moors M.D.  On: 07/22/2015 10:12    Assessment & Plan:   Problem List Items Addressed This Visit    Abdominal discomfort, generalized - Primary    Predominant LLQ pain to palpation with guarding, but generalized discomfort throughout abdomen, ongoing over last month, worse over last weekend. ER records reviewed as well  as reassuring CT scan 5/31. Treated for presumed diverticulitis 5/22 with 10d augmentin course - with no improvement.  Not consistent with GERD. ?recurrent or complicated diverticulitis - recheck ESR, CBC today. No fever or significant ascites appreciated - points against SBP.  ?MSK strain. Treat with bentyl trial (did not fill ER Rx). If no improvement, may try valium as muscle relaxant.  Update with effect.       Relevant Orders   Sedimentation rate   CBC with Differential/Platelet       Follow up plan: Return if symptoms worsen or fail to improve.  [name], MD

## 2015-08-03 NOTE — Patient Instructions (Signed)
Blood work today Trial bentyl for abdominal spasms as needed. If ineffective, may try valium 89m as muscle relaxant. Update me with effect.  Hydrocodone pain medicine refilled #[phone] - use sparingly.

## 2015-08-03 NOTE — Patient Instructions (Addendum)
Your physician has requested that you go to the basement for the lab work before leaving today.  Continue Nexium and Reglan.  Please take Bentyl (dicyclomine) 20 mg three times daily (instead of as needed)  You may take the Vicodin as needed that Dr Danise Mina prescribed today.  Please follow up with Dr Hilarie Fredrickson on Wednesday, 10/07/15 @ 2:30 pm. _________________________________________________________________________________________________ Chad [name] have been scheduled for a CT scan of the abdomen and pelvis at [facility] ([address]---this is in the same building as [facility]).   You are scheduled on TODAY at 4:00 pm. You should arrive 15 minutes prior to your appointment time for registration. Please follow the written instructions below on the day of your exam:  WARNING: IF YOU ARE ALLERGIC TO IODINE/X-RAY DYE, PLEASE NOTIFY RADIOLOGY IMMEDIATELY AT [phone]! YOU WILL BE GIVEN A 13 HOUR PREMEDICATION PREP.  1) Do not eat or drink anything after 12:00 pm (4 hours prior to your test) 2) You have been given 2 bottles of oral contrast to drink. The solution may taste better if refrigerated, but do NOT add ice or any other liquid to this solution. Shake well before drinking.    Drink 1 bottle of contrast @ 2:00 pm (2 hours prior to your exam)  Drink 1 bottle of contrast @ 3:00 pm (1 hour prior to your exam)  You may take any medications as prescribed with a small amount of water except for the following: Metformin, Glucophage, Glucovance, Avandamet, Riomet, Fortamet, Actoplus Met, Janumet, Glumetza or Metaglip. The above medications must be held the day of the exam AND 48 hours after the exam.  The purpose of you drinking the oral contrast is to aid in the visualization of your intestinal tract. The contrast solution may cause some diarrhea. Before your exam is started, you will be given a small amount of fluid to drink. Depending on your individual set of symptoms, you  may also receive an intravenous injection of x-ray contrast/dye. Plan on being at [facility] for 30 minutes or longer, depending on the type of exam you are having performed.  This test typically takes 30-45 minutes to complete.  If you have any questions regarding your exam or if you need to reschedule, you may call the CT department at [phone] between the hours of 8:00 am and 5:00 pm, Monday-Friday. ________________________________________________________________________  If you are age 64 or older, your body mass index should be between 23-30. Your Body mass index is 45.99 kg/(m^2). If this is out of the aforementioned range listed, please consider follow up with your Primary Care Provider.  If you are age 56 or younger, your body mass index should be between 19-25. Your Body mass index is 45.99 kg/(m^2). If this is out of the aformentioned range listed, please consider follow up with your Primary Care Provider.

## 2015-08-03 NOTE — Progress Notes (Signed)
Subjective:    Patient ID: Chad [name], male    DOB: 09-10-1955, 60 y.o.   MRN: [id number]  HPI Chad [name] is a 60 year old male with history of GERD, chronic gastroparesis, adenomatous colon polyps, diverticulosis who is seen in follow-up to evaluate abnormal liver imaging and also abdominal pain. He is here today with his wife. He states that he is having severe abdominal pain located both in the right and left upper quadrant and bilaterally in the sides of his abdomen. This has been worsening over the last several weeks. He was initially seen by primary care and treated with 10 days of Augmentin empirically for diverticulitis. He took the entire course without improvement in symptoms. CT scan was performed on 07/22/2015 which showed no acute findings but morphologic features suggesting liver cirrhosis. There is splenomegaly. There was colonic diverticulosis without diverticulitis. He states the pain has worsened since then. Pain does not relate to eating. He reports normal bowel movements which are occurring 1-2 times daily. He is not using MiraLAX because when he stopped the hydrocodone that he was taking for knee replacement the constipation improved. He is using a stool softener daily. He was given hydrocodone and dicyclomine by Dr. Danise Mina when seen earlier today. He has continued Nexium 40 mg twice a day and Reglan 10 mg before meals. He states that the pain worsens if he bends over to pick something up. It is associated with nausea. No vomiting. He has been able to eat. He denies fever or chills. He denies blood in his stool or melena.  Of note he has gained about 40 pounds since being seen here last in August 2016. He attributes this to eating more and being less active. Regarding his liver he denies any history of jaundice, ascites, itching. No alcohol use. Denies family history of liver disease.    Review of Systems As per history of present illness, otherwise negative  Current  Medications, Allergies, Past Medical History, Past Surgical History, Family History and Social History were reviewed in [employer] record.     Objective:   Physical Exam BP 134/76 mmHg  Pulse 64  Ht 5' 10"  (1.778 m)  Wt 320 lb 8 oz (145.378 kg)  BMI 45.99 kg/m2 Constitutional: Well-developed and well-nourished. No distress. HEENT: Normocephalic and atraumatic. Oropharynx is clear and moist. No oropharyngeal exudate. Conjunctivae are normal.  No scleral icterus. Neck: Neck supple. Trachea midline. Cardiovascular: Normal rate, regular rhythm and intact distal pulses. No M/R/G Pulmonary/chest: Effort normal and breath sounds normal. No wheezing, rales or rhonchi. Abdominal: Soft, Obese, diffusely tender to light and moderate palpation throughout the abdomen. Bowel sounds active throughout. No rashes noted. Extremities: no clubbing, cyanosis, or edema Lymphadenopathy: No cervical adenopathy noted. Neurological: Alert and oriented to person place and time. Skin: Skin is warm and dry. No rashes noted. Psychiatric: Normal mood and affect. Behavior is normal.  CBC    Component Value Date/Time   WBC 6.7 08/03/2015 1008   RBC 5.15 08/03/2015 1008   HGB 14.2 08/03/2015 1008   HCT 42.6 08/03/2015 1008   PLT 143.0* 08/03/2015 1008   MCV 82.6 08/03/2015 1008   MCV 88.0 08/04/2010   MCH 27.5 07/30/2015 1003   MCHC 33.3 08/03/2015 1008   RDW 14.1 08/03/2015 1008   LYMPHSABS 0.8 08/03/2015 1008   MONOABS 0.5 08/03/2015 1008   EOSABS 0.1 08/03/2015 1008   BASOSABS 0.0 08/03/2015 1008    CMP     Component Value Date/Time  NA 136 08/03/2015 1205   NA 139 08/04/2010   K 3.8 08/03/2015 1205   K 4.4 08/04/2010   CL 102 08/03/2015 1205   CO2 27 08/03/2015 1205   GLUCOSE 135* 08/03/2015 1205   BUN 17 08/03/2015 1205   CREATININE 0.88 08/03/2015 1205   CREATININE 1.08 08/04/2010   CALCIUM 9.0 08/03/2015 1205   PROT 6.6 08/03/2015 1205   ALBUMIN 4.2 08/03/2015  1205   AST 31 08/03/2015 1205   AST 47 08/04/2010   ALT 29 08/03/2015 1205   ALKPHOS 126* 08/03/2015 1205   BILITOT 0.9 08/03/2015 1205   GFRNONAA >60 07/30/2015 1003   GFRAA >60 07/30/2015 1003    Lipase     Component Value Date/Time   LIPASE 28 07/30/2015 1003    CT abd/pelvis 07/22/15 - reviewed     Assessment & Plan:  60 year old male with history of GERD, chronic gastroparesis, adenomatous colon polyps, diverticulosis who is seen in follow-up to evaluate abnormal liver imaging and also abdominal pain.  1. Abdominal pain --Diffuse and moderate to severe of unclear etiology. I raised the question with him of possible thoracic or lumbar back pain radiating to the abdomen but he states he has no back pain. His abdomen is quite tender to palpation. Previous CT was unrevealing for a source for the pain as was lab analysis. Given severity of pain I have recommended that we repeat the CT scan with IV contrast. I will schedule Bentyl 20 mg 3 times a day. He will continue Nexium and Reglan. He was given Vicodin by Dr. Danise Mina this morning which he can use as recommended. I considered empiric antibiotics but diverticulitis felt unlikely and he also not improve after a 10 day course of Augmentin. Further recommendations after imaging.  2. Nodular liver and splenomegaly -- high suspicion for cirrhosis with nonalcoholic fatty liver disease being the most likely etiology. Previous hep C testing negative. I recommended Fibrosure test, hepatitis B serology, hepatitis A serology, ANA, anti-smooth muscle, antimitochondrial, iron studies. He will need EGD for variceal screening but we will discuss this at follow-up after we elucidate and treat his abdominal pain as discussed in #1.  25 minutes spent with the patient today. Greater than 50% was spent in counseling and coordination of care with the patient Follow-up in 2-3 months

## 2015-08-03 NOTE — Progress Notes (Signed)
Pre visit review using our clinic review tool, if applicable. No additional management support is needed unless otherwise documented below in the visit note. 

## 2015-08-03 NOTE — Assessment & Plan Note (Addendum)
Predominant LLQ pain to palpation with guarding, but generalized discomfort throughout abdomen, ongoing over last month, worse over last weekend. ER records reviewed as well as reassuring CT scan 5/31. Treated for presumed diverticulitis 5/22 with 10d augmentin course - with no improvement.  Not consistent with GERD. ?recurrent or complicated diverticulitis - recheck ESR, CBC today. No fever or significant ascites appreciated - points against SBP.  ?MSK strain. Treat with bentyl trial (did not fill ER Rx). If no improvement, may try valium as muscle relaxant.  Update with effect.  

## 2015-08-04 LAB — HEPATITIS A ANTIBODY, TOTAL: HEP A TOTAL AB: NONREACTIVE

## 2015-08-04 LAB — HEPATITIS B CORE ANTIBODY, TOTAL: HEP B C TOTAL AB: NONREACTIVE

## 2015-08-04 LAB — FERRITIN: Ferritin: 24.3 ng/mL (ref 22.0–322.0)

## 2015-08-04 LAB — MITOCHONDRIAL ANTIBODIES: Mitochondrial M2 Ab, IgG: <=20 Units (ref <=20.0)

## 2015-08-04 LAB — HEPATITIS B SURFACE ANTIGEN: HEP B S AG: NEGATIVE

## 2015-08-04 LAB — ANA: ANA: NEGATIVE

## 2015-08-04 LAB — HEPATITIS B SURFACE ANTIBODY,QUALITATIVE: Hep B S Ab: NEGATIVE

## 2015-08-05 LAB — LIVER FIBROSIS, FIBROTEST-ACTITEST
ALT: 28 U/L (ref 9–46)
APOLIPOPROTEIN A1: 148 mg/dL (ref 94–176)
Alpha-2-Macroglobulin: 236 mg/dL (ref 106–279)
BILIRUBIN: 0.8 mg/dL (ref 0.2–1.2)
Fibrosis Score: 0.63
GGT: 176 U/L — AB (ref 3–70)
HAPTOGLOBIN: 115 mg/dL (ref 43–212)
Necroinflammat ACT Score: 0.2
REFERENCE ID: 1548842

## 2015-08-06 ENCOUNTER — Other Ambulatory Visit: Payer: Self-pay

## 2015-08-06 DIAGNOSIS — R109 Unspecified abdominal pain: Secondary | ICD-10-CM

## 2015-08-06 DIAGNOSIS — R935 Abnormal findings on diagnostic imaging of other abdominal regions, including retroperitoneum: Secondary | ICD-10-CM

## 2015-08-06 LAB — ANTI-SMOOTH MUSCLE ANTIBODY, IGG: Smooth Muscle Ab: <20 U (ref <20)

## 2015-08-07 ENCOUNTER — Ambulatory Visit
Admission: RE | Admit: 2015-08-07 | Discharge: 2015-08-07 | Disposition: A | Discharge status: Home / Self Care | Payer: Medicare Other | Source: Ambulatory Visit | Attending: Family Medicine | Admitting: Family Medicine

## 2015-08-07 ENCOUNTER — Encounter: Payer: Self-pay | Admitting: Family Medicine

## 2015-08-07 ENCOUNTER — Ambulatory Visit (INDEPENDENT_AMBULATORY_CARE_PROVIDER_SITE_OTHER): Payer: Medicare Other | Admitting: Family Medicine

## 2015-08-07 VITALS — BP 142/78 | HR 68 | Temp 97.6°F | Wt 324.5 lb

## 2015-08-07 DIAGNOSIS — R1084 Generalized abdominal pain: Secondary | ICD-10-CM | POA: Diagnosis not present

## 2015-08-07 DIAGNOSIS — M1288 Other specific arthropathies, not elsewhere classified, other specified site: Secondary | ICD-10-CM | POA: Insufficient documentation

## 2015-08-07 DIAGNOSIS — M47896 Other spondylosis, lumbar region: Secondary | ICD-10-CM | POA: Diagnosis not present

## 2015-08-07 DIAGNOSIS — I2510 Atherosclerotic heart disease of native coronary artery without angina pectoris: Secondary | ICD-10-CM

## 2015-08-07 DIAGNOSIS — M47816 Spondylosis without myelopathy or radiculopathy, lumbar region: Secondary | ICD-10-CM | POA: Diagnosis not present

## 2015-08-07 LAB — POCT URINALYSIS DIPSTICK
Bilirubin, UA: NEGATIVE
Blood, UA: NEGATIVE
Glucose, UA: NEGATIVE
KETONES UA: NEGATIVE
Nitrite, UA: NEGATIVE
PH UA: 6
PROTEIN UA: NEGATIVE
SPEC GRAV UA: 1.025
Urobilinogen, UA: 0.2

## 2015-08-07 MED ORDER — GABAPENTIN 300 MG PO CAPS: 300.0000 mg | ORAL_CAPSULE | Freq: Two times a day (BID) | ORAL | Status: DC

## 2015-08-07 NOTE — Assessment & Plan Note (Addendum)
Persistent diffuse abdominal pain, unrevealing workup to date (see HPI). Bentyl has not helped - advised stop this. Ok to continue valium and hydrocodone for now.  ?radiation of pain from lumbar arthritis - update lumbar films, trial gabapentin 319m nightly with increase to BID if tolerated. Recheck UA to eval hematuria for nephrolithiasis. Not consistent with non-rash shingles given not really dermatomal.

## 2015-08-07 NOTE — Progress Notes (Signed)
BP 142/78 mmHg  Pulse 68  Temp(Src) 97.6 F (36.4 C) (Oral)  Wt 324 lb 8 oz (147.192 kg)   CC: worsening abd pain  Subjective:    Patient ID: Chad [name], male    DOB: 1955-07-30, 60 y.o.   MRN: [id number]  HPI: Chad [name] is a 60 y.o. male presenting on 08/07/2015 for Abdominal Pain   See prior note and recent GI eval for details. Briefly, worsening generalized abd pain without cause found - normal CT abd/pelvis with contrast x2 in the last month, normal labwork and urinalysis. Did not improve after augmentin course either. Recent ESR 8.   Newly found cirrhosis, not thought related however. No fever or other concerns for spontaneous bacterial peritonitis (and WBC WNL).   He continues regular nexium 57m twice daily, reglan 120mprior to meals.   Last visit we refilled hydrocodone and started bentyl 2047mID AC. I also prescribed valium 5mg25md prn as muscle relaxant - he has been taking BID. None of these have really helped.   Had normal BM this morning, passing gas. No fevers. Describes open wound soreness sensation L>R abdomen with radiation to midline worse with any movement. Some stinging without burning/tingling. Appetite down some. Some nausea due to pain. No relation to food. No improvement with BM.   Relevant past medical, surgical, family and social history reviewed and updated as indicated. Interim medical history since our last visit reviewed. Allergies and medications reviewed and updated. Current Outpatient Prescriptions on File Prior to Visit  Medication Sig  . aspirin 81 MG tablet Take two by mouth daily  . diazepam (VALIUM) 5 MG tablet Take 1 tablet (5 mg total) by mouth every 12 (twelve) hours as needed for muscle spasms.  . diMarland Kitchenyclomine (BENTYL) 20 MG tablet Take 1 tablet (20 mg total) by mouth 3 (three) times daily as needed for spasms.  . DoMariane Baumgartenium (DSS) 100 MG CAPS 1 tab 2 times a day while on narcotics.  STOOL SOFTENER  . furosemide (LASIX) 20 MG  tablet TAKE 1 TABLET BY MOUTH TWICE A DAY AS NEEDED  . HYDROcodone-acetaminophen (NORCO/VICODIN) 5-325 MG tablet Take 1 tablet by mouth every 6 (six) hours as needed for moderate pain.  . leMarland Kitchenothyroxine (SYNTHROID, LEVOTHROID) 100 MCG tablet TAKE 1 TABLET BY MOUTH ONCE A DAY  . metoCLOPramide (REGLAN) 10 MG tablet TAKE 1 TABLET BY MOUTH 3 TIMES DAILY BEFORE MEALS  . metoprolol tartrate (LOPRESSOR) 25 MG tablet TAKE 1 TABLET BY MOUTH TWICE A DAY  . Multiple Vitamin (MULITIVITAMIN WITH MINERALS) TABS Take 1 tablet by mouth daily.  . NEMarland KitchenIUM 40 MG capsule TAKE 1 CAPSULE BY MOUTH TWICE DAILY  . nitroGLYCERIN (NITROSTAT) 0.4 MG SL tablet Place 1 tablet (0.4 mg total) under the tongue every 5 (five) minutes as needed for chest pain.  . omMarland Kitchenga-3 acid ethyl esters (LOVAZA) 1 g capsule Take one capsule in am and two at night  . polyethylene glycol (MIRALAX / GLYCOLAX) packet 17grams in 16 oz of water twice a day until bowel movement.  LAXITIVE.  Restart if two days since last bowel movement  . potassium chloride (K-DUR) 10 MEQ tablet TAKE 1 TABLET BY MOUTH TWICE A DAY AS NEEDED  . VENTOLIN HFA 108 (90 Base) MCG/ACT inhaler INHALE 2 PUFFS INTO THE LUNGS EVERY 6 HOURS AS NEEDED FOR WHEEZING ORSHORTNESS OF BREATH   No current facility-administered medications on file prior to visit.    Review of Systems Per HPI unless specifically indicated  in ROS section     Objective:    BP 142/78 mmHg  Pulse 68  Temp(Src) 97.6 F (36.4 C) (Oral)  Wt 324 lb 8 oz (147.192 kg)  Wt Readings from Last 3 Encounters:  08/07/15 324 lb 8 oz (147.192 kg)  08/03/15 320 lb 8 oz (145.378 kg)  08/03/15 322 lb 4 oz (146.172 kg)    Physical Exam  Constitutional: He appears well-developed and well-nourished. No distress.  HENT:  Mouth/Throat: Oropharynx is clear and moist. No oropharyngeal exudate.  Cardiovascular: Normal rate, regular rhythm, normal heart sounds and intact distal pulses.   No murmur heard. Pulmonary/Chest:  Effort normal and breath sounds normal. No respiratory distress. He has no wheezes. He has no rales.  Abdominal: Soft. Normal appearance and bowel sounds are normal. He exhibits no distension and no mass. There is no hepatosplenomegaly. There is tenderness. There is guarding. There is no rigidity, no rebound, no CVA tenderness and negative Murphy's sign. No hernia.  Midline incision Diffuse abdominal discomfort to light palpation throughout abdomen greatest at lateral sides Pain exacerbated with bending forward  Musculoskeletal: Normal range of motion. He exhibits no edema.  No pain midline spine No paraspinous mm tenderness Neg SLR bilaterally. No pain with int/ext rotation at hip.  Skin: Skin is warm and dry. No rash noted. No erythema.  No vesicular rash  Nursing note and vitals reviewed.  Results for orders placed or performed in visit on 08/07/15  POCT urinalysis dipstick  Result Value Ref Range   Color, UA yellow    Clarity, UA clear    Glucose, UA Negative    Bilirubin, UA Negative    Ketones, UA Negative    Spec Grav, UA 1.025    Blood, UA Negative    pH, UA 6.0    Protein, UA Negative    Urobilinogen, UA 0.2    Nitrite, UA Negative    Leukocytes, UA small (1+) (A) Negative   Lab Results  Component Value Date   TSH 1.888 03/22/2015       Assessment & Plan:  Over 25 minutes were spent face-to-face with the patient during this encounter and >50% of that time was spent on counseling and coordination of care  Problem List Items Addressed This Visit    Abdominal discomfort, generalized - Primary    Persistent diffuse abdominal pain, unrevealing workup to date (see HPI). Bentyl has not helped - advised stop this. Ok to continue valium and hydrocodone for now.  ?radiation of pain from lumbar arthritis - update lumbar films, trial gabapentin 300mg nightly with increase to BID if tolerated. Recheck UA to eval hematuria for nephrolithiasis. Not consistent with non-rash  shingles given not really dermatomal.       Relevant Orders   DG Lumbar Spine Complete (Completed)   POCT urinalysis dipstick (Completed)       Follow up plan: Return if symptoms worsen or fail to improve.   , MD   

## 2015-08-07 NOTE — Patient Instructions (Signed)
Back off bentyl.  Ok to continue valium and hydrocodone as needed.  Possible musculoskeletal pain - may be from lumbar spine.  Try gabapentin 353m at night time for next few days, if helping may increase to twice daily.  Xray and urine check today.

## 2015-08-07 NOTE — Progress Notes (Signed)
Pre visit review using our clinic review tool, if applicable. No additional management support is needed unless otherwise documented below in the visit note. 

## 2015-08-13 ENCOUNTER — Emergency Department (HOSPITAL_COMMUNITY)
Admission: EM | Admit: 2015-08-13 | Discharge: 2015-08-14 | Disposition: A | Discharge status: Home / Self Care | Payer: Medicare Other | Attending: Emergency Medicine | Admitting: Emergency Medicine

## 2015-08-13 ENCOUNTER — Emergency Department (HOSPITAL_COMMUNITY): Payer: Medicare Other

## 2015-08-13 ENCOUNTER — Encounter (HOSPITAL_COMMUNITY): Payer: Self-pay | Admitting: Family Medicine

## 2015-08-13 DIAGNOSIS — Z7982 Long term (current) use of aspirin: Secondary | ICD-10-CM | POA: Diagnosis not present

## 2015-08-13 DIAGNOSIS — I110 Hypertensive heart disease with heart failure: Secondary | ICD-10-CM | POA: Diagnosis not present

## 2015-08-13 DIAGNOSIS — E119 Type 2 diabetes mellitus without complications: Secondary | ICD-10-CM | POA: Diagnosis not present

## 2015-08-13 DIAGNOSIS — R1084 Generalized abdominal pain: Secondary | ICD-10-CM | POA: Diagnosis not present

## 2015-08-13 DIAGNOSIS — E785 Hyperlipidemia, unspecified: Secondary | ICD-10-CM | POA: Insufficient documentation

## 2015-08-13 DIAGNOSIS — Z79899 Other long term (current) drug therapy: Secondary | ICD-10-CM | POA: Insufficient documentation

## 2015-08-13 DIAGNOSIS — I5032 Chronic diastolic (congestive) heart failure: Secondary | ICD-10-CM | POA: Insufficient documentation

## 2015-08-13 DIAGNOSIS — E039 Hypothyroidism, unspecified: Secondary | ICD-10-CM | POA: Insufficient documentation

## 2015-08-13 DIAGNOSIS — R1032 Left lower quadrant pain: Secondary | ICD-10-CM | POA: Diagnosis present

## 2015-08-13 DIAGNOSIS — Z96651 Presence of right artificial knee joint: Secondary | ICD-10-CM | POA: Diagnosis not present

## 2015-08-13 DIAGNOSIS — K5730 Diverticulosis of large intestine without perforation or abscess without bleeding: Secondary | ICD-10-CM | POA: Diagnosis not present

## 2015-08-13 LAB — URINALYSIS, ROUTINE W REFLEX MICROSCOPIC
BILIRUBIN URINE: NEGATIVE
HGB URINE DIPSTICK: NEGATIVE
Ketones, ur: NEGATIVE mg/dL
NITRITE: NEGATIVE
PH: 6.5 (ref 5.0–8.0)
Protein, ur: NEGATIVE mg/dL
SPECIFIC GRAVITY, URINE: 1.023 (ref 1.005–1.030)

## 2015-08-13 LAB — COMPREHENSIVE METABOLIC PANEL
ALBUMIN: 3.7 g/dL (ref 3.5–5.0)
ALT: 35 U/L (ref 17–63)
ANION GAP: 9 (ref 5–15)
AST: 38 U/L (ref 15–41)
Alkaline Phosphatase: 145 U/L — ABNORMAL HIGH (ref 38–126)
BUN: 12 mg/dL (ref 6–20)
CHLORIDE: 100 mmol/L — AB (ref 101–111)
CO2: 25 mmol/L (ref 22–32)
Calcium: 9.3 mg/dL (ref 8.9–10.3)
Creatinine, Ser: 0.98 mg/dL (ref 0.61–1.24)
GFR calc Af Amer: >60 mL/min (ref >60)
GFR calc non Af Amer: >60 mL/min (ref >60)
GLUCOSE: 280 mg/dL — AB (ref 65–99)
POTASSIUM: 3.8 mmol/L (ref 3.5–5.1)
SODIUM: 134 mmol/L — AB (ref 135–145)
Total Bilirubin: 0.5 mg/dL (ref 0.3–1.2)
Total Protein: 6.1 g/dL — ABNORMAL LOW (ref 6.5–8.1)

## 2015-08-13 LAB — CBC
HEMATOCRIT: 41 % (ref 39.0–52.0)
HEMOGLOBIN: 13.3 g/dL (ref 13.0–17.0)
MCH: 26.6 pg (ref 26.0–34.0)
MCHC: 32.4 g/dL (ref 30.0–36.0)
MCV: 82 fL (ref 78.0–100.0)
Platelets: 135 10*3/uL — ABNORMAL LOW (ref 150–400)
RBC: 5 MIL/uL (ref 4.22–5.81)
RDW: 13.1 % (ref 11.5–15.5)
WBC: 4.7 10*3/uL (ref 4.0–10.5)

## 2015-08-13 LAB — URINE MICROSCOPIC-ADD ON

## 2015-08-13 LAB — LIPASE, BLOOD: Lipase: 29 U/L (ref 11–51)

## 2015-08-13 MED ORDER — IOPAMIDOL (ISOVUE-300) INJECTION 61%
INTRAVENOUS | Status: AC
  Administered 2015-08-13: 100 mL
  Filled 2015-08-13: qty 100

## 2015-08-13 MED ORDER — ONDANSETRON HCL 4 MG/2ML IJ SOLN
4.0000 mg | Freq: Once | INTRAMUSCULAR | Status: AC
  Administered 2015-08-13: 4 mg via INTRAVENOUS
  Filled 2015-08-13: qty 2

## 2015-08-13 MED ORDER — SODIUM CHLORIDE 0.9 % IV BOLUS (SEPSIS)
1000.0000 mL | Freq: Once | INTRAVENOUS | Status: AC
  Administered 2015-08-13: 1000 mL via INTRAVENOUS

## 2015-08-13 MED ORDER — FENTANYL CITRATE (PF) 100 MCG/2ML IJ SOLN
50.0000 ug | Freq: Once | INTRAMUSCULAR | Status: AC
  Administered 2015-08-13: 50 ug via INTRAVENOUS
  Filled 2015-08-13: qty 2

## 2015-08-13 MED ORDER — FENTANYL CITRATE (PF) 100 MCG/2ML IJ SOLN
100.0000 ug | Freq: Once | INTRAMUSCULAR | Status: AC
  Administered 2015-08-13: 100 ug via INTRAVENOUS
  Filled 2015-08-13: qty 2
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
## 2015-08-13 NOTE — ED Notes (Signed)
Pt here for left flank/abd pain. sts worsening over the past month. sts was started on gabapentin and valium by his doctor. Denies any N,V,D. Denies urinary complaints. Denies injury

## 2015-08-14 ENCOUNTER — Telehealth: Payer: Self-pay | Admitting: Internal Medicine

## 2015-08-14 MED ORDER — PREGABALIN 100 MG PO CAPS: 100.0000 mg | ORAL_CAPSULE | Freq: Three times a day (TID) | ORAL | Status: DC

## 2015-08-14 NOTE — Telephone Encounter (Signed)
Wife states pt is still having issues with abdominal pain, had to go to the ER last night. Does not think they can wait until august to be seen. Pt scheduled to see Alonza Bogus PA 08/26/15@10am . Pt aware of appt.

## 2015-08-14 NOTE — Discharge Instructions (Signed)
As discussed, your evaluation today has been largely reassuring.  But, it is important that you monitor your condition carefully, and do not hesitate to return to the ED if you develop new, or concerning changes in your condition. ? ?Otherwise, please follow-up with your physician for appropriate ongoing care. ? ?

## 2015-08-14 NOTE — ED Notes (Signed)
Pt left at this time with all belongings.

## 2015-08-21 ENCOUNTER — Other Ambulatory Visit: Payer: Self-pay

## 2015-08-21 MED ORDER — DIAZEPAM 5 MG PO TABS: 5.0000 mg | ORAL_TABLET | Freq: Two times a day (BID) | ORAL | Status: DC | PRN

## 2015-08-21 MED ORDER — HYDROCODONE-ACETAMINOPHEN 5-325 MG PO TABS: 1.0000 | ORAL_TABLET | Freq: Four times a day (QID) | ORAL | Status: DC | PRN

## 2015-08-21 NOTE — Telephone Encounter (Signed)
Also requests valium RF.

## 2015-08-21 NOTE — Telephone Encounter (Signed)
plz phone in valium. Hydrocodone printed and in Kim's box.

## 2015-08-21 NOTE — Telephone Encounter (Signed)
Mrs Schoch left v/m requesting rx hydrocodone apap. Call when ready for pick up. Last printed # 30 on 08/03/15. Pt last seen for f/u 08/03/15.

## 2015-08-22 HISTORY — PX: ESOPHAGOGASTRODUODENOSCOPY: SHX1529

## 2015-08-22 HISTORY — PX: COLONOSCOPY: SHX174

## 2015-08-24 NOTE — Telephone Encounter (Signed)
Message left advising patient and Rx placed up front for pick up.

## 2015-08-24 NOTE — Telephone Encounter (Signed)
Rx called in as directed.   

## 2015-08-26 ENCOUNTER — Encounter: Payer: Self-pay | Admitting: Gastroenterology

## 2015-08-26 ENCOUNTER — Ambulatory Visit (INDEPENDENT_AMBULATORY_CARE_PROVIDER_SITE_OTHER): Payer: Medicare Other | Admitting: Gastroenterology

## 2015-08-26 VITALS — BP 138/64 | HR 84 | Ht 70.0 in | Wt 338.0 lb

## 2015-08-26 DIAGNOSIS — I2510 Atherosclerotic heart disease of native coronary artery without angina pectoris: Secondary | ICD-10-CM | POA: Diagnosis not present

## 2015-08-26 DIAGNOSIS — R1084 Generalized abdominal pain: Secondary | ICD-10-CM | POA: Diagnosis not present

## 2015-08-26 DIAGNOSIS — R194 Change in bowel habit: Secondary | ICD-10-CM | POA: Diagnosis not present

## 2015-08-26 MED ORDER — CILIDINIUM-CHLORDIAZEPOXIDE 2.5-5 MG PO CAPS: 2.0000 | ORAL_CAPSULE | Freq: Three times a day (TID) | ORAL | Status: DC

## 2015-08-26 NOTE — Progress Notes (Addendum)
08/26/2015 Chad [name] [id number] 28-Dec-1955   History of Present Illness:  Patient returns to the office today with ongoing complaints of diffuse abdominal pain for which he was seen on 6/12 by Dr. Hilarie Fredrickson (see that note for further details).  Overall this pain has been present for the past 2 months and he has never experienced any similar pain to this in the past.  Initially the pain would wax and wane, but has now become more constant and more intense. He says that the pain is often excruciating and has him doubled over. Interestingly enough, the pain does not prevent him from sleeping or keep him again from sleep at night. He is on gabapentin and valium per his PCP, Dr. Danise Mina, which he says does help to some degree but makes him very sleepy. He was given Bentyl to try as well but does not think that helped much. Vicodin helps some if he takes two of the 5/325 mg at a time.  Two CT scans in June were unrevealing for cause of pain.  Current Medications, Allergies, Past Medical History, Past Surgical History, Family History and Social History were reviewed in [employer] record.   Physical Exam: BP 138/64 mmHg  Pulse 84  Ht 5' 10"  (1.778 m)  Wt 338 lb (153.316 kg)  BMI 48.50 kg/m2 General: Well developed white male in no acute distress, but does appear uncomfortable at times. Head: Normocephalic and atraumatic Eyes:  Sclerae anicteric, conjunctiva pink  Ears: Normal auditory acuity Lungs: Clear throughout to auscultation Heart: Regular rate and rhythm Abdomen: Soft,obese, non-distended.  Mid-line scar noted.  Normal bowel sounds.  Diffusely tender even to light palpation throughout the abdomen.  No rashes noted. Rectal:  Will be done at the time of colonoscopy. Musculoskeletal: Symmetrical with no gross deformities  Extremities: No edema  Neurological: Alert oriented x 4, grossly non-focal Psychological:  Alert and cooperative. Normal mood and  affect  Assessment and Recommendations: *60 year old male with history of GERD, chronic gastroparesis, adenomatous colon polyps, diverticulosis who is seen again for complaints of ongoing generalized abdominal pain.  1. Abdominal pain --Diffuse and moderate to severe of unclear etiology.  The question has been raised of possible thoracic or lumbar back pain radiating to the abdomen but he states he has no back pain.  ? Other neurologic source.   His abdomen is quite tender to palpation. Previous multiple CT scans unrevealing for a source for the pain as was lab analysis.  Has been on gabapentin and Valium with some relief, but these medicines make him sleepy. We will continue the gabapentin, but discontinue the Valium and we'll try Librax one to 2 capsules 3 times daily instead. Although he had a colonoscopy less than 1 year ago we still have no source of his pain so we will schedule repeat colonoscopy as well as an endoscopy with Dr. Hilarie Fredrickson to evaluate.  Also reports some change in bowel habits.  The risks, benefits, and alternatives to EGD and colonoscopy were discussed with the patient and he consents to proceed.  2. Nodular liver and splenomegaly -- high suspicion for cirrhosis with nonalcoholic fatty liver disease being the most likely etiology with other liver serologies negative.  Fibrosure shows F3 scarring. EGD to exclude varices.  Alonza Bogus, PA-C  Addendum: Reviewed and agree with management.  Etiology of abdominal pain, which is significant for him remains elusive. My suspicion for a GI source is low. If endoscopic evaluation unremarkable consider vascular etiology/abnormality.  Jerene Bears, MD

## 2015-08-26 NOTE — Patient Instructions (Signed)
We faxed the Librax to [facility]. You have been scheduled for an endoscopy and colonoscopy. Please follow the written instructions given to you at your visit today. We gave you a prep sample.  If you use inhalers (even only as needed), please bring them with you on the day of your procedure. Your physician has requested that you go to www.startemmi.com and enter the access code given to you at your visit today. This web site gives a general overview about your procedure. However, you should still follow specific instructions given to you by our office regarding your preparation for the procedure.  Discontinue the valium.

## 2015-08-31 ENCOUNTER — Encounter: Payer: Self-pay | Admitting: Internal Medicine

## 2015-08-31 ENCOUNTER — Ambulatory Visit (AMBULATORY_SURGERY_CENTER): Payer: Medicare Other | Admitting: Internal Medicine

## 2015-08-31 VITALS — BP 133/58 | HR 63 | Temp 99.3°F | Resp 16 | Ht 70.0 in | Wt 338.0 lb

## 2015-08-31 DIAGNOSIS — K635 Polyp of colon: Secondary | ICD-10-CM | POA: Diagnosis not present

## 2015-08-31 DIAGNOSIS — I509 Heart failure, unspecified: Secondary | ICD-10-CM | POA: Diagnosis not present

## 2015-08-31 DIAGNOSIS — R194 Change in bowel habit: Secondary | ICD-10-CM

## 2015-08-31 DIAGNOSIS — K7460 Unspecified cirrhosis of liver: Secondary | ICD-10-CM | POA: Diagnosis not present

## 2015-08-31 DIAGNOSIS — D125 Benign neoplasm of sigmoid colon: Secondary | ICD-10-CM | POA: Diagnosis not present

## 2015-08-31 DIAGNOSIS — R1084 Generalized abdominal pain: Secondary | ICD-10-CM | POA: Diagnosis not present

## 2015-08-31 DIAGNOSIS — G4733 Obstructive sleep apnea (adult) (pediatric): Secondary | ICD-10-CM | POA: Diagnosis not present

## 2015-08-31 DIAGNOSIS — K2950 Unspecified chronic gastritis without bleeding: Secondary | ICD-10-CM | POA: Diagnosis not present

## 2015-08-31 DIAGNOSIS — E119 Type 2 diabetes mellitus without complications: Secondary | ICD-10-CM | POA: Diagnosis not present

## 2015-08-31 DIAGNOSIS — Z9884 Bariatric surgery status: Secondary | ICD-10-CM | POA: Diagnosis not present

## 2015-08-31 DIAGNOSIS — I2510 Atherosclerotic heart disease of native coronary artery without angina pectoris: Secondary | ICD-10-CM | POA: Diagnosis not present

## 2015-08-31 DIAGNOSIS — K7581 Nonalcoholic steatohepatitis (NASH): Secondary | ICD-10-CM | POA: Diagnosis not present

## 2015-08-31 DIAGNOSIS — K2970 Gastritis, unspecified, without bleeding: Secondary | ICD-10-CM

## 2015-08-31 DIAGNOSIS — R109 Unspecified abdominal pain: Secondary | ICD-10-CM | POA: Diagnosis not present

## 2015-08-31 HISTORY — DX: Gastritis, unspecified, without bleeding: K29.70

## 2015-08-31 MED ORDER — SODIUM CHLORIDE 0.9 % IV SOLN: 500.0000 mL | INTRAVENOUS | Status: DC

## 2015-08-31 NOTE — Progress Notes (Signed)
Called to room to assist during endoscopic procedure.  Patient ID and intended procedure confirmed with present staff. Received instructions for my participation in the procedure from the performing physician.  

## 2015-08-31 NOTE — Op Note (Signed)
[facility] Patient Name: Chad [name] Procedure Date: 08/31/2015 3:06 PM MRN: [id number] Endoscopist: Jerene Bears , MD Age: 60 Referring MD:  Date of Birth: 30-Sep-1955 Gender: Male Account #: [id number] Procedure:                Colonoscopy Indications:              Generalized abdominal pain, Change in bowel habits Medicines:                Monitored Anesthesia Care Procedure:                Pre-Anesthesia Assessment:                           - Prior to the procedure, a History and Physical                            was performed, and patient medications and                            allergies were reviewed. The patient's tolerance of                            previous anesthesia was also reviewed. The risks                            and benefits of the procedure and the sedation                            options and risks were discussed with the patient.                            All questions were answered, and informed consent                            was obtained. Prior Anticoagulants: The patient has                            taken no previous anticoagulant or antiplatelet                            agents. ASA Grade Assessment: III - A patient with                            severe systemic disease. After reviewing the risks                            and benefits, the patient was deemed in                            satisfactory condition to undergo the procedure.                           After obtaining informed consent, the colonoscope  was passed under direct vision. Throughout the                            procedure, the patient's blood pressure, pulse, and                            oxygen saturations were monitored continuously. The                            Model CF-HQ190L [id number]) scope was introduced                            through the anus and advanced to the the terminal                            ileum. The  colonoscopy was performed without                            difficulty. The patient tolerated the procedure                            well. The quality of the bowel preparation was                            good. The terminal ileum, ileocecal valve,                            appendiceal orifice, and rectum were photographed. Scope In: 3:29:55 PM Scope Out: 3:43:35 PM Scope Withdrawal Time: 0 hours 11 minutes 22 seconds  Total Procedure Duration: 0 hours 13 minutes 40 seconds  Findings:                 The digital rectal exam was normal.                           The terminal ileum appeared normal.                           A 2 mm polyp was found in the sigmoid colon. The                            polyp was sessile. The polyp was removed with a                            cold biopsy forceps. Resection and retrieval were                            complete.                           Scattered small and large-mouthed diverticula were                            found in the sigmoid colon and descending colon.  Internal hemorrhoids were found during                            retroflexion. The hemorrhoids were small. Complications:            No immediate complications. Estimated Blood Loss:     Estimated blood loss: none. Impression:               - The examined portion of the ileum was normal.                           - One 2 mm polyp in the sigmoid colon, removed with                            a cold biopsy forceps. Resected and retrieved.                           - Moderate diverticulosis in the sigmoid colon and                            in the descending colon.                           - Internal hemorrhoids. Recommendation:           - Patient has a contact number available for                            emergencies. The signs and symptoms of potential                            delayed complications were discussed with the                             patient. Return to normal activities tomorrow.                            Written discharge instructions were provided to the                            patient.                           - Gastroparesis diet.                           - Continue present medications.                           - Await pathology results.                           - Repeat colonoscopy is recommended. The                            colonoscopy date will be determined after pathology  results from today's exam become available for                            review.                           - No explanation for the patient's abdominal pain                            found during this colonoscopy. Jerene Bears, MD 08/31/2015 3:59:28 PM This report has been signed electronically.

## 2015-08-31 NOTE — Patient Instructions (Addendum)
handoffs given: Gastritis, Gastroparesis Diet, Polyps, Diverticulosis,Hemorroids.   YOU HAD AN ENDOSCOPIC PROCEDURE TODAY AT [facility]:   Refer to the procedure report that was given to you for any specific questions about what was found during the examination.  If the procedure report does not answer your questions, please call your gastroenterologist to clarify.  If you requested that your care partner not be given the details of your procedure findings, then the procedure report has been included in a sealed envelope for you to review at your convenience later.  YOU SHOULD EXPECT: Some feelings of bloating in the abdomen. Passage of more gas than usual.  Walking can help get rid of the air that was put into your GI tract during the procedure and reduce the bloating. If you had a lower endoscopy (such as a colonoscopy or flexible sigmoidoscopy) you may notice spotting of blood in your stool or on the toilet paper. If you underwent a bowel prep for your procedure, you may not have a normal bowel movement for a few days.  Please Note:  You might notice some irritation and congestion in your nose or some drainage.  This is from the oxygen used during your procedure.  There is no need for concern and it should clear up in a day or so.  SYMPTOMS TO REPORT IMMEDIATELY:   Following lower endoscopy (colonoscopy or flexible sigmoidoscopy):  Excessive amounts of blood in the stool  Significant tenderness or worsening of abdominal pains  Swelling of the abdomen that is new, acute  Fever of 100F or higher   Following upper endoscopy (EGD)  Vomiting of blood or coffee ground material  New chest pain or pain under the shoulder blades  Painful or persistently difficult swallowing  New shortness of breath  Fever of 100F or higher  Black, tarry-looking stools  For urgent or emergent issues, a gastroenterologist can be reached at any hour by calling [phone].   DIET: Your  first meal following the procedure should be a small meal and then it is ok to progress to your normal diet. Heavy or fried foods are harder to digest and may make you feel nauseous or bloated.  Likewise, meals heavy in dairy and vegetables can increase bloating.  Drink plenty of fluids but you should avoid alcoholic beverages for 24 hours.  ACTIVITY:  You should plan to take it easy for the rest of today and you should NOT DRIVE or use heavy machinery until tomorrow (because of the sedation medicines used during the test).    FOLLOW UP: Our staff will call the number listed on your records the next business day following your procedure to check on you and address any questions or concerns that you may have regarding the information given to you following your procedure. If we do not reach you, we will leave a message.  However, if you are feeling well and you are not experiencing any problems, there is no need to return our call.  We will assume that you have returned to your regular daily activities without incident.  If any biopsies were taken you will be contacted by phone or by letter within the next 1-3 weeks.  Please call us at [phone] if you have not heard about the biopsies in 3 weeks.    SIGNATURES/CONFIDENTIALITY: You and/or your care partner have signed paperwork which will be entered into your electronic medical record.  These signatures attest to the fact that that the information  above on your After Visit Summary has been reviewed and is understood.  Full responsibility of the confidentiality of this discharge information lies with you and/or your care-partner.

## 2015-08-31 NOTE — Progress Notes (Signed)
Stable to RR 

## 2015-08-31 NOTE — Op Note (Signed)
[facility] Patient Name: Nevan Creighton Procedure Date: 08/31/2015 3:08 PM MRN: [id number] Endoscopist: Jerene Bears , MD Age: 60 Referring MD:  Date of Birth: June 09, 1955 Gender: Male Account #: [id number] Procedure:                Upper GI endoscopy Indications:              Generalized abdominal pain, nodular liver by CT                            scan raising question of cirrhosis (variceal                            screening) Medicines:                Monitored Anesthesia Care Procedure:                Pre-Anesthesia Assessment:                           - Prior to the procedure, a History and Physical                            was performed, and patient medications and                            allergies were reviewed. The patient's tolerance of                            previous anesthesia was also reviewed. The risks                            and benefits of the procedure and the sedation                            options and risks were discussed with the patient.                            All questions were answered, and informed consent                            was obtained. Prior Anticoagulants: The patient has                            taken no previous anticoagulant or antiplatelet                            agents. ASA Grade Assessment: III - A patient with                            severe systemic disease. After reviewing the risks                            and benefits, the patient was deemed in  satisfactory condition to undergo the procedure.                           After obtaining informed consent, the endoscope was                            passed under direct vision. Throughout the                            procedure, the patient's blood pressure, pulse, and                            oxygen saturations were monitored continuously. The                            Model GIF-[id number]) scope was introduced                          through the mouth, and advanced to the second part                            of duodenum. The upper GI endoscopy was                            accomplished without difficulty. The patient                            tolerated the procedure well. Scope In: Scope Out: Findings:                 The examined esophagus was normal.                           There is no endoscopic evidence of varices in the                            entire esophagus.                           Evidence of a previous surgical intervention was                            found in the cardia which connects to the incisura                            with an aberent tubular lumen. This is patent and                            the upper endoscope passes easily.                           Mild inflammation characterized by erythema was                            found in the gastric body and in the gastric  antrum. Biopsies were taken with a cold forceps for                            histology and Helicobacter pylori testing.                           The examined duodenum was normal. Complications:            No immediate complications. Estimated Blood Loss:     Estimated blood loss: none. Impression:               - Normal esophagus.                           - A previous surgical intervention was found in the                            gastric cardia and at the incisura (aberent tubular                            lumen connecting the gastric cardia to the distal                            lesser curve/incisura).                           - Gastritis. Biopsied.                           - Normal examined duodenum. Recommendation:           - Patient has a contact number available for                            emergencies. The signs and symptoms of potential                            delayed complications were discussed with the                            patient.  Return to normal activities tomorrow.                            Written discharge instructions were provided to the                            patient.                           - Gastroparesis diet.                           - Continue present medications.                           - Await pathology results.                           -  Repeat upper endoscopy in 3 years for screening                            purposes.                           - Perform a colonoscopy today. Jerene Bears, MD 08/31/2015 3:55:58 PM This report has been signed electronically.

## 2015-09-01 ENCOUNTER — Telehealth: Payer: Self-pay

## 2015-09-01 ENCOUNTER — Encounter: Payer: Self-pay | Admitting: Family Medicine

## 2015-09-01 DIAGNOSIS — M1711 Unilateral primary osteoarthritis, right knee: Secondary | ICD-10-CM | POA: Diagnosis not present

## 2015-09-01 NOTE — Telephone Encounter (Signed)
  Follow up Call-  Call back number 08/31/2015 12/17/2014  Post procedure Call Back phone  # [phone]  Permission to leave phone message Yes Yes     Patient questions:  Do you have a fever, pain , or abdominal swelling? No. Pain Score  0 *  Have you tolerated food without any problems? No.  Have you been able to return to your normal activities? Yes.    Do you have any questions about your discharge instructions: Diet   No. Medications  No. Follow up visit  No.  Do you have questions or concerns about your Care? No.  Actions: * If pain score is 4 or above: No action needed, pain <4.

## 2015-09-02 ENCOUNTER — Telehealth: Payer: Self-pay | Admitting: *Deleted

## 2015-09-02 NOTE — Telephone Encounter (Signed)
LM for the patient advising him we did a prior authorization for the Librax and faxed information for an appeal to try to get this approved.  I advised him to call us if he has any questions or worsening symptoms.  I advised I will call him once I hear back from the insurance company regarding the appeal.

## 2015-09-03 ENCOUNTER — Telehealth: Payer: Self-pay | Admitting: *Deleted

## 2015-09-03 NOTE — Telephone Encounter (Signed)
Called the patient to advise I did get an answer back on the appeal I did for the Librax 5-2.5 mg (Chlordiazepoxide-Clidinium ).  The insurance company [employer] did reverse the denial and have approved this medication.  Informed the patient and he thanked me.   I called [facility] and I was told they got a fax from [facility] as well.  They ran the prescription and it is $3.80.

## 2015-09-06 ENCOUNTER — Encounter (HOSPITAL_COMMUNITY): Payer: Self-pay | Admitting: Emergency Medicine

## 2015-09-06 ENCOUNTER — Emergency Department (HOSPITAL_COMMUNITY)
Admission: EM | Admit: 2015-09-06 | Discharge: 2015-09-06 | Disposition: A | Discharge status: Home / Self Care | Payer: Medicare Other | Attending: Emergency Medicine | Admitting: Emergency Medicine

## 2015-09-06 ENCOUNTER — Emergency Department (HOSPITAL_COMMUNITY): Payer: Medicare Other

## 2015-09-06 DIAGNOSIS — Z79899 Other long term (current) drug therapy: Secondary | ICD-10-CM | POA: Diagnosis not present

## 2015-09-06 DIAGNOSIS — R339 Retention of urine, unspecified: Secondary | ICD-10-CM | POA: Diagnosis present

## 2015-09-06 DIAGNOSIS — I110 Hypertensive heart disease with heart failure: Secondary | ICD-10-CM | POA: Insufficient documentation

## 2015-09-06 DIAGNOSIS — Z96651 Presence of right artificial knee joint: Secondary | ICD-10-CM | POA: Diagnosis not present

## 2015-09-06 DIAGNOSIS — R109 Unspecified abdominal pain: Secondary | ICD-10-CM | POA: Diagnosis not present

## 2015-09-06 DIAGNOSIS — R350 Frequency of micturition: Secondary | ICD-10-CM | POA: Diagnosis not present

## 2015-09-06 DIAGNOSIS — I5032 Chronic diastolic (congestive) heart failure: Secondary | ICD-10-CM | POA: Insufficient documentation

## 2015-09-06 DIAGNOSIS — E1143 Type 2 diabetes mellitus with diabetic autonomic (poly)neuropathy: Secondary | ICD-10-CM | POA: Diagnosis not present

## 2015-09-06 DIAGNOSIS — I2510 Atherosclerotic heart disease of native coronary artery without angina pectoris: Secondary | ICD-10-CM | POA: Insufficient documentation

## 2015-09-06 DIAGNOSIS — E11319 Type 2 diabetes mellitus with unspecified diabetic retinopathy without macular edema: Secondary | ICD-10-CM | POA: Diagnosis not present

## 2015-09-06 DIAGNOSIS — R3915 Urgency of urination: Secondary | ICD-10-CM | POA: Insufficient documentation

## 2015-09-06 DIAGNOSIS — Z7982 Long term (current) use of aspirin: Secondary | ICD-10-CM | POA: Insufficient documentation

## 2015-09-06 LAB — CBC WITH DIFFERENTIAL/PLATELET
Basophils Absolute: 0 10*3/uL (ref 0.0–0.1)
Basophils Relative: 0 %
EOS PCT: 2 %
Eosinophils Absolute: 0.1 10*3/uL (ref 0.0–0.7)
HCT: 41.7 % (ref 39.0–52.0)
HEMOGLOBIN: 13.8 g/dL (ref 13.0–17.0)
LYMPHS ABS: 0.7 10*3/uL (ref 0.7–4.0)
LYMPHS PCT: 14 %
MCH: 26.8 pg (ref 26.0–34.0)
MCHC: 33.1 g/dL (ref 30.0–36.0)
MCV: 81 fL (ref 78.0–100.0)
Monocytes Absolute: 0.5 10*3/uL (ref 0.1–1.0)
Monocytes Relative: 11 %
Neutro Abs: 3.5 10*3/uL (ref 1.7–7.7)
Neutrophils Relative %: 73 %
PLATELETS: 138 10*3/uL — AB (ref 150–400)
RBC: 5.15 MIL/uL (ref 4.22–5.81)
RDW: 13.1 % (ref 11.5–15.5)
WBC: 4.8 10*3/uL (ref 4.0–10.5)

## 2015-09-06 LAB — URINE MICROSCOPIC-ADD ON
BACTERIA UA: NONE SEEN
RBC / HPF: NONE SEEN RBC/hpf (ref 0–5)
Squamous Epithelial / LPF: NONE SEEN

## 2015-09-06 LAB — COMPREHENSIVE METABOLIC PANEL
ALBUMIN: 3.5 g/dL (ref 3.5–5.0)
ALK PHOS: 157 U/L — AB (ref 38–126)
ALT: 38 U/L (ref 17–63)
ANION GAP: 8 (ref 5–15)
AST: 43 U/L — AB (ref 15–41)
BUN: 11 mg/dL (ref 6–20)
CALCIUM: 9.1 mg/dL (ref 8.9–10.3)
CO2: 26 mmol/L (ref 22–32)
Chloride: 99 mmol/L — ABNORMAL LOW (ref 101–111)
Creatinine, Ser: 0.94 mg/dL (ref 0.61–1.24)
GFR calc Af Amer: >60 mL/min (ref >60)
GFR calc non Af Amer: >60 mL/min (ref >60)
GLUCOSE: 329 mg/dL — AB (ref 65–99)
Potassium: 3.9 mmol/L (ref 3.5–5.1)
Sodium: 133 mmol/L — ABNORMAL LOW (ref 135–145)
Total Bilirubin: 1.5 mg/dL — ABNORMAL HIGH (ref 0.3–1.2)
Total Protein: 6 g/dL — ABNORMAL LOW (ref 6.5–8.1)

## 2015-09-06 LAB — URINALYSIS, ROUTINE W REFLEX MICROSCOPIC
Bilirubin Urine: NEGATIVE
HGB URINE DIPSTICK: NEGATIVE
Ketones, ur: NEGATIVE mg/dL
Nitrite: NEGATIVE
Protein, ur: NEGATIVE mg/dL
SPECIFIC GRAVITY, URINE: 1.012 (ref 1.005–1.030)
pH: 6.5 (ref 5.0–8.0)

## 2015-09-06 LAB — BRAIN NATRIURETIC PEPTIDE: B Natriuretic Peptide: 14.6 pg/mL (ref 0.0–100.0)

## 2015-09-06 LAB — LIPASE, BLOOD: Lipase: 23 U/L (ref 11–51)

## 2015-09-06 MED ORDER — MORPHINE SULFATE (PF) 4 MG/ML IV SOLN
4.0000 mg | Freq: Once | INTRAVENOUS | Status: AC
  Administered 2015-09-06: 4 mg via INTRAVENOUS
  Filled 2015-09-06: qty 1

## 2015-09-06 MED ORDER — TAMSULOSIN HCL 0.4 MG PO CAPS: 0.4000 mg | ORAL_CAPSULE | Freq: Every day | ORAL | Status: DC

## 2015-09-06 NOTE — ED Notes (Signed)
Pt reports new onset flank pain with urinary frequency, painful urination for two days.

## 2015-09-06 NOTE — Discharge Instructions (Signed)
You have been seen in the ED today with difficulty urinating. We have started a medication called Tamsulosin which should help your symptoms. Review this with your PCP on Monday. I have provided the follow up information for an area Urologist in case these symptoms continue.   Return to the ED with any inability to urinate for more than 8 hours, sudden severe pain, fever, or other concerning symptoms to you.  Flank Pain Flank pain is pain in your side. The flank is the area of your side between your upper belly (abdomen) and your back. Pain in this area can be caused by many different things. [facility] care and treatment will depend on the cause of your pain.  Rest as told by your doctor.  Drink enough fluids to keep your pee (urine) clear or pale yellow.  Only take medicine as told by your doctor.  Tell your doctor about any changes in your pain.  Follow up with your doctor. GET HELP RIGHT AWAY IF:   Your pain does not get better with medicine.   You have new symptoms or your symptoms get worse.  Your pain gets worse.   You have belly (abdominal) pain.   You are short of breath.   You always feel sick to your stomach (nauseous).   You keep throwing up (vomiting).   You have puffiness (swelling) in your belly.   You feel light-headed or you pass out (faint).   You have blood in your pee.  You have a fever or lasting symptoms for more than 2-3 days.  You have a fever and your symptoms suddenly get worse. MAKE SURE YOU:   Understand these instructions.  Will watch your condition.  Will get help right away if you are not doing well or get worse.   This information is not intended to replace advice given to you by your health care provider. Make sure you discuss any questions you have with your health care provider.   Document Released: 11/17/2007 Document Revised: 02/28/2014 Document Reviewed: 09/22/2011 Elsevier Interactive Patient Education [employer]
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
## 2015-09-07 ENCOUNTER — Telehealth: Payer: Self-pay | Admitting: Family Medicine

## 2015-09-07 NOTE — Telephone Encounter (Addendum)
I just got this message. Spoke with patient and he said he is feeling better now. He said he determined it was the Librax that Dr. Hilarie Fredrickson put him on. He has stopped the med and is able to urinate normally. Does he need to follow up with you or just contact Dr. Hilarie Fredrickson?

## 2015-09-07 NOTE — Telephone Encounter (Signed)
Patient went to [facility] ER yesterday, Sunday for not being able to urinate.  ER told him that he needs to see his PCP today and there are no openings.  Please advise.

## 2015-09-09 ENCOUNTER — Encounter: Payer: Self-pay | Admitting: Internal Medicine

## 2015-09-09 NOTE — Telephone Encounter (Signed)
Patient notified and verbalized understanding. 

## 2015-09-09 NOTE — Telephone Encounter (Signed)
Noted. Agree with staying off medication for now.  Will cc Janett Billow as fyi.

## 2015-09-15 ENCOUNTER — Encounter: Payer: Self-pay | Admitting: Family Medicine

## 2015-09-15 ENCOUNTER — Ambulatory Visit (INDEPENDENT_AMBULATORY_CARE_PROVIDER_SITE_OTHER): Payer: Medicare Other | Admitting: Family Medicine

## 2015-09-15 VITALS — BP 126/84 | HR 72 | Temp 98.0°F | Wt 335.5 lb

## 2015-09-15 DIAGNOSIS — I10 Essential (primary) hypertension: Secondary | ICD-10-CM

## 2015-09-15 DIAGNOSIS — E039 Hypothyroidism, unspecified: Secondary | ICD-10-CM

## 2015-09-15 DIAGNOSIS — R1084 Generalized abdominal pain: Secondary | ICD-10-CM

## 2015-09-15 DIAGNOSIS — E1143 Type 2 diabetes mellitus with diabetic autonomic (poly)neuropathy: Secondary | ICD-10-CM

## 2015-09-15 DIAGNOSIS — K219 Gastro-esophageal reflux disease without esophagitis: Secondary | ICD-10-CM

## 2015-09-15 DIAGNOSIS — K3184 Gastroparesis: Secondary | ICD-10-CM

## 2015-09-15 DIAGNOSIS — E785 Hyperlipidemia, unspecified: Secondary | ICD-10-CM

## 2015-09-15 DIAGNOSIS — D3501 Benign neoplasm of right adrenal gland: Secondary | ICD-10-CM

## 2015-09-15 DIAGNOSIS — I2510 Atherosclerotic heart disease of native coronary artery without angina pectoris: Secondary | ICD-10-CM

## 2015-09-15 DIAGNOSIS — E1165 Type 2 diabetes mellitus with hyperglycemia: Secondary | ICD-10-CM | POA: Diagnosis not present

## 2015-09-15 DIAGNOSIS — K7460 Unspecified cirrhosis of liver: Secondary | ICD-10-CM

## 2015-09-15 DIAGNOSIS — D696 Thrombocytopenia, unspecified: Secondary | ICD-10-CM

## 2015-09-15 DIAGNOSIS — E113599 Type 2 diabetes mellitus with proliferative diabetic retinopathy without macular edema, unspecified eye: Secondary | ICD-10-CM | POA: Diagnosis not present

## 2015-09-15 DIAGNOSIS — D1779 Benign lipomatous neoplasm of other sites: Secondary | ICD-10-CM

## 2015-09-15 LAB — MICROALBUMIN / CREATININE URINE RATIO
Creatinine,U: 98.4 mg/dL
MICROALB/CREAT RATIO: 0.7 mg/g (ref 0.0–30.0)

## 2015-09-15 LAB — HEMOGLOBIN A1C: HEMOGLOBIN A1C: 9 % — AB (ref 4.6–6.5)

## 2015-09-15 LAB — TSH: TSH: 2.09 u[IU]/mL (ref 0.35–4.50)

## 2015-09-15 MED ORDER — METFORMIN HCL 500 MG PO TABS: 500.0000 mg | ORAL_TABLET | Freq: Two times a day (BID) | ORAL | 3 refills | Status: DC

## 2015-09-15 MED ORDER — GLIMEPIRIDE 1 MG PO TABS: 1.0000 mg | ORAL_TABLET | Freq: Every day | ORAL | 3 refills | Status: DC

## 2015-09-15 NOTE — Assessment & Plan Note (Signed)
Reviewed noted weight gain trend - pt and wife motivated to restart exercise regimen.

## 2015-09-15 NOTE — Assessment & Plan Note (Signed)
Uncontrolled as evidenced by recent cbg's at home. Attribute due to recent weight gain noted.  Start metformin 52m BID, amaryl 125mdaily with breakfast.  Check A1c today. Foot exam today.  Will need eye exam.

## 2015-09-15 NOTE — Patient Instructions (Addendum)
Labs today.  Decrease gabapentin to 37m at night time.  Stop valium.  Start metfomin 5058monce daily for 1 week then increase to twice daily.  Start glimepiride 14m28maily with breakfast.  Work on incorporating exercise regimen into routine.  Return in 6 wks for follow up visit.

## 2015-09-15 NOTE — Assessment & Plan Note (Signed)
Continue reglan 76m tid.

## 2015-09-15 NOTE — Assessment & Plan Note (Signed)
Now off RYR - will recommend restart next visit. Statins caused leg cramps.

## 2015-09-15 NOTE — Assessment & Plan Note (Signed)
Continue nexium 44m BID, reglan 15mTID.

## 2015-09-15 NOTE — Assessment & Plan Note (Signed)
Update TSH

## 2015-09-15 NOTE — Progress Notes (Addendum)
BP 126/84   Pulse 72   Temp 98 F (36.7 C) (Oral)   Wt (!) 335 lb 8 oz (152.2 kg)   BMI 48.14 kg/m    CC: DM f/u Subjective:    Patient ID: Chad [name], male    DOB: [birth year], 60 y.o.   MRN: [id number]  HPI: Chad [name] is a 60 y.o. male presenting on 09/15/2015 for Hyperglycemia   15 lb weight gain over last 2 months. 55lb weight gain over last 1 year. Motivated to restart walking. Notes marked decreased stamina.   DM - regularly does check sugars fasting 300s.  Compliant with antihyperglycemic regimen which includes: diet controlled.  Denies low sugars or hypoglycemic symptoms. Denies paresthesias. Last diabetic eye exam 09/2013.  Pneumovax: 08/2011.  Prevnar: not due. Gastroparesis.  Lab Results  Component Value Date   HGBA1C 6.3 (H) 03/22/2015   Diabetic Foot Exam - Simple   Simple Foot Form Diabetic Foot exam was performed with the following findings:  Yes 09/15/2015  3:45 PM  Visual Inspection See comments:  Yes Sensation Testing Intact to touch and monofilament testing bilaterally:  Yes Pulse Check Posterior Tibialis and Dorsalis pulse intact bilaterally:  Yes Comments Bilaterally thickened with calluses at soles      Reviewed recent unrevealing GI eval for recurrent abdominal pain - including CT scans, EGD/colonoscopy. Valium, librax, gabapentin started for this without significant improvement. Librax may have caused urinary retention. Stopped med and sxs resolved. Regularly takes reglan for gastroparesis. Abdominal pain stopped last week. Denies med changes, diet changes to improve abdominal pain.   Recent dx cirrhosis by imaging - pending f/u with GI in August. Will also receive hep B series at that time.   Chronic pain from arthritis s/p joint replacements. Endorses hydrocodone use TID. H/o abnormal drug screen - innaprop negative for hydrocodone 09/2013, inapprop negative for hydrocodone and tramadol 02/2014; inapprop negative hydrocodone 03/2015. States he has  not been taking valium over last 2-3 wks. Ran out of hydrocodone 1 wks ago. Last filled #30 08/21/2015.   Known sensorineural hearing loss bilateral - wears hearing aides.   Relevant past medical, surgical, family and social history reviewed and updated as indicated. Interim medical history since our last visit reviewed. Allergies and medications reviewed and updated. Current Outpatient Prescriptions on File Prior to Visit  Medication Sig  . aspirin 81 MG tablet Take 162 mg by mouth every morning.   Mariane Baumgarten Sodium (DSS) 100 MG CAPS 1 tab 2 times a day while on narcotics.  STOOL SOFTENER (Patient taking differently: Take 100 mg by mouth every morning. )  . Flaxseed, Linseed, (BL FLAX SEED OIL PO) Take 1,200 mg by mouth 2 (two) times daily.  . furosemide (LASIX) 20 MG tablet TAKE 1 TABLET BY MOUTH TWICE A DAY AS NEEDED (Patient taking differently: TAKE 1 TABLET BY MOUTH TWICE A DAY AS NEEDED FOR FLUID)  . HYDROcodone-acetaminophen (NORCO/VICODIN) 5-325 MG tablet Take 1 tablet by mouth every 6 (six) hours as needed for moderate pain.  Marland Kitchen levothyroxine (SYNTHROID, LEVOTHROID) 100 MCG tablet TAKE 1 TABLET BY MOUTH ONCE A DAY  . metoCLOPramide (REGLAN) 10 MG tablet TAKE 1 TABLET BY MOUTH 3 TIMES DAILY BEFORE MEALS (Patient taking differently: TAKE 1 TABLET BY MOUTH 3 TIMES DAILY BEFORE MEALS AS NEEDED FOR GURD)  . metoprolol tartrate (LOPRESSOR) 25 MG tablet TAKE 1 TABLET BY MOUTH TWICE A DAY  . Multiple Vitamin (MULITIVITAMIN WITH MINERALS) TABS Take 1 tablet by mouth daily.  Marland Kitchen  NEXIUM 40 MG capsule TAKE 1 CAPSULE BY MOUTH TWICE DAILY  . nitroGLYCERIN (NITROSTAT) 0.4 MG SL tablet Place 1 tablet (0.4 mg total) under the tongue every 5 (five) minutes as needed for chest pain.  Marland Kitchen omega-3 acid ethyl esters (LOVAZA) 1 g capsule Take 1 g by mouth 2 (two) times daily.   . potassium chloride (K-DUR) 10 MEQ tablet TAKE 1 TABLET BY MOUTH TWICE A DAY AS NEEDED (Patient taking differently: TAKE 1 TABLET BY MOUTH  TWICE A DAY AS NEEDED WHEN TAKING LASIX)  . VENTOLIN HFA 108 (90 Base) MCG/ACT inhaler INHALE 2 PUFFS INTO THE LUNGS EVERY 6 HOURS AS NEEDED FOR WHEEZING ORSHORTNESS OF BREATH   No current facility-administered medications on file prior to visit.     Review of Systems Per HPI unless specifically indicated in ROS section     Objective:    BP 126/84   Pulse 72   Temp 98 F (36.7 C) (Oral)   Wt (!) 335 lb 8 oz (152.2 kg)   BMI 48.14 kg/m   Wt Readings from Last 3 Encounters:  09/15/15 (!) 335 lb 8 oz (152.2 kg)  08/31/15 (!) 338 lb (153.3 kg)  08/26/15 (!) 338 lb (153.3 kg)    Physical Exam  Constitutional: He appears well-developed and well-nourished. No distress.  HENT:  Head: Normocephalic and atraumatic.  Mouth/Throat: Oropharynx is clear and moist. No oropharyngeal exudate.  Eyes: Conjunctivae and EOM are normal. Pupils are equal, round, and reactive to light. No scleral icterus.  Neck: Normal range of motion. Neck supple.  Cardiovascular: Normal rate, regular rhythm, normal heart sounds and intact distal pulses.   No murmur heard. Pulmonary/Chest: Effort normal and breath sounds normal. No respiratory distress. He has no wheezes. He has no rales.  Musculoskeletal: He exhibits no edema.  See HPI for foot exam if done  Lymphadenopathy:    He has no cervical adenopathy.  Skin: Skin is warm and dry. No rash noted.  Psychiatric: He has a normal mood and affect.  Nursing note and vitals reviewed.  Results for orders placed or performed during the hospital encounter of 09/06/15  Comprehensive metabolic panel  Result Value Ref Range   Sodium 133 (L) 135 - 145 mmol/L   Potassium 3.9 3.5 - 5.1 mmol/L   Chloride 99 (L) 101 - 111 mmol/L   CO2 26 22 - 32 mmol/L   Glucose, Bld 329 (H) 65 - 99 mg/dL   BUN 11 6 - 20 mg/dL   Creatinine, Ser 0.94 0.61 - 1.24 mg/dL   Calcium 9.1 8.9 - 10.3 mg/dL   Total Protein 6.0 (L) 6.5 - 8.1 g/dL   Albumin 3.5 3.5 - 5.0 g/dL   AST 43 (H) 15  - 41 U/L   ALT 38 17 - 63 U/L   Alkaline Phosphatase 157 (H) 38 - 126 U/L   Total Bilirubin 1.5 (H) 0.3 - 1.2 mg/dL   GFR calc non Af Amer >60 >60 mL/min   GFR calc Af Amer >60 >60 mL/min   Anion gap 8 5 - 15  Lipase, blood  Result Value Ref Range   Lipase 23 11 - 51 U/L  Brain natriuretic peptide  Result Value Ref Range   B Natriuretic Peptide 14.6 0.0 - 100.0 pg/mL  CBC with Differential  Result Value Ref Range   WBC 4.8 4.0 - 10.5 K/uL   RBC 5.15 4.22 - 5.81 MIL/uL   Hemoglobin 13.8 13.0 - 17.0 g/dL   HCT 41.7 39.0 -  52.0 %   MCV 81.0 78.0 - 100.0 fL   MCH 26.8 26.0 - 34.0 pg   MCHC 33.1 30.0 - 36.0 g/dL   RDW 13.1 11.5 - 15.5 %   Platelets 138 (L) 150 - 400 K/uL   Neutrophils Relative % 73 %   Neutro Abs 3.5 1.7 - 7.7 K/uL   Lymphocytes Relative 14 %   Lymphs Abs 0.7 0.7 - 4.0 K/uL   Monocytes Relative 11 %   Monocytes Absolute 0.5 0.1 - 1.0 K/uL   Eosinophils Relative 2 %   Eosinophils Absolute 0.1 0.0 - 0.7 K/uL   Basophils Relative 0 %   Basophils Absolute 0.0 0.0 - 0.1 K/uL  Urinalysis, Routine w reflex microscopic  Result Value Ref Range   Color, Urine YELLOW YELLOW   APPearance CLEAR CLEAR   Specific Gravity, Urine 1.012 1.005 - 1.030   pH 6.5 5.0 - 8.0   Glucose, UA >1000 (A) NEGATIVE mg/dL   Hgb urine dipstick NEGATIVE NEGATIVE   Bilirubin Urine NEGATIVE NEGATIVE   Ketones, ur NEGATIVE NEGATIVE mg/dL   Protein, ur NEGATIVE NEGATIVE mg/dL   Nitrite NEGATIVE NEGATIVE   Leukocytes, UA TRACE (A) NEGATIVE  Urine microscopic-add on  Result Value Ref Range   Squamous Epithelial / LPF NONE SEEN NONE SEEN   WBC, UA 6-30 0 - 5 WBC/hpf   RBC / HPF NONE SEEN 0 - 5 RBC/hpf   Bacteria, UA NONE SEEN NONE SEEN   Lab Results  Component Value Date   TSH 1.888 03/22/2015       Assessment & Plan:   Problem List Items Addressed This Visit    Abdominal discomfort, generalized    Unrevealing extensive GI evaluation - ?lower thoracic radiculoapty related.  Regardless, symptoms have largely resolved.      Cirrhosis of liver without ascites ([place])    Pending f/u with GI 09/2015, appreciate their care.       Diabetic gastroparesis (HCC)    Continue reglan 30m tid.       Relevant Medications   metFORMIN (GLUCOPHAGE) 500 MG tablet   glimepiride (AMARYL) 1 MG tablet   GERD (gastroesophageal reflux disease)    Continue nexium 4109mBID, reglan 1025mID.       HLD (hyperlipidemia)    Now off RYR - will recommend restart next visit. Statins caused leg cramps.      HTN (hypertension)    Chronic, stable. Continue current regimen.       Hypothyroid    Update TSH .      Relevant Orders   TSH   Myelolipoma of right adrenal gland   Obesity, Class III, BMI 40-49.9 (morbid obesity) [place]  Reviewed noted weight gain trend - pt and wife motivated to restart exercise regimen.       Relevant Medications   metFORMIN (GLUCOPHAGE) 500 MG tablet   glimepiride (AMARYL) 1 MG tablet   Thrombocytopenia ([place]  Counts have stabillized      Type 2 diabetes mellitus, uncontrolled, with retinopathy ([facility]    Uncontrolled as evidenced by recent cbg's at home. Attribute due to recent weight gain noted.  Start metformin 500m19mD, amaryl 1mg 91mly with breakfast.  Check A1c today. Foot exam today.  Will need eye exam.       Relevant Medications   metFORMIN (GLUCOPHAGE) 500 MG tablet   glimepiride (AMARYL) 1 MG tablet   Other Relevant Orders   Hemoglobin A1c   Microalbumin / creatinine  urine ratio    Other Visit Diagnoses   None.      Follow up plan: Return in about 6 weeks (around 10/27/2015), or as needed, for follow up visit.  [name], MD

## 2015-09-15 NOTE — Assessment & Plan Note (Addendum)
Unrevealing extensive GI evaluation - ?lower thoracic radiculoapty related. Regardless, symptoms have largely resolved.

## 2015-09-15 NOTE — Assessment & Plan Note (Signed)
Chronic, stable. Continue current regimen. 

## 2015-09-15 NOTE — Assessment & Plan Note (Signed)
Counts have stabillized

## 2015-09-15 NOTE — Assessment & Plan Note (Signed)
Pending f/u with GI 09/2015, appreciate their care.

## 2015-09-18 ENCOUNTER — Other Ambulatory Visit: Payer: Self-pay | Admitting: Family Medicine

## 2015-09-18 MED ORDER — RED YEAST RICE 600 MG PO CAPS: 1.0000 | ORAL_CAPSULE | Freq: Every day | ORAL | Status: DC

## 2015-09-18 MED ORDER — RED YEAST RICE 600 MG PO CAPS: 1.0000 | ORAL_CAPSULE | Freq: Two times a day (BID) | ORAL | Status: DC

## 2015-09-28 ENCOUNTER — Telehealth: Payer: Self-pay | Admitting: Family Medicine

## 2015-09-28 NOTE — Telephone Encounter (Signed)
Chad [name] with [facility] calling.  Pt wants a refill on Tramadol 50 mg. Pt was given this rx by Dr. Para March in the past.   1 tablet prn 4-6hrs

## 2015-09-29 ENCOUNTER — Encounter: Payer: Self-pay | Admitting: *Deleted

## 2015-09-29 MED ORDER — TRAMADOL HCL 50 MG PO TABS: 50.0000 mg | ORAL_TABLET | Freq: Two times a day (BID) | ORAL | 0 refills | Status: DC | PRN

## 2015-09-29 NOTE — Telephone Encounter (Signed)
Left refill on voice mail at pharmacy  

## 2015-09-29 NOTE — Telephone Encounter (Signed)
plz phone in tramadol - not currently on hydrocodone.

## 2015-10-07 ENCOUNTER — Ambulatory Visit (INDEPENDENT_AMBULATORY_CARE_PROVIDER_SITE_OTHER): Payer: Medicare Other | Admitting: Internal Medicine

## 2015-10-07 ENCOUNTER — Encounter: Payer: Self-pay | Admitting: Internal Medicine

## 2015-10-07 VITALS — BP 120/64 | HR 72 | Ht 70.0 in | Wt 333.0 lb

## 2015-10-07 DIAGNOSIS — R109 Unspecified abdominal pain: Secondary | ICD-10-CM | POA: Diagnosis not present

## 2015-10-07 DIAGNOSIS — I2510 Atherosclerotic heart disease of native coronary artery without angina pectoris: Secondary | ICD-10-CM

## 2015-10-07 DIAGNOSIS — Z23 Encounter for immunization: Secondary | ICD-10-CM | POA: Diagnosis not present

## 2015-10-07 DIAGNOSIS — K7460 Unspecified cirrhosis of liver: Secondary | ICD-10-CM

## 2015-10-07 DIAGNOSIS — K7581 Nonalcoholic steatohepatitis (NASH): Secondary | ICD-10-CM | POA: Diagnosis not present

## 2015-10-07 NOTE — Progress Notes (Signed)
Subjective:    Patient ID: Chad [name], male    DOB: 08/12/1955, 60 y.o.   MRN: [id number]  HPI Chad [name] is a 60 year old male with a past medical history of GERD, chronic gastroparesis, adenomatous colon polyps, diverticulosis, probable cirrhosis (at least advanced fibrosis) most likely secondary to nonalcoholic fatty liver disease who is here for follow-up. He was seen several times in the last few months for abdominal pain of unclear etiology. He had cross-sectional imaging which was unrevealing for a source and came for repeat upper endoscopy and colonoscopy which I performed on 08/31/2015. EGD showed no endoscopic evidence of varices. Prior surgical intervention in the stomach with altered anatomy (fistulous communication between the proximal and distal stomach), gastritis which was biopsied and normal examined duodenum. Colonoscopy revealed a 2 mm sigmoid polyp which was removed and scattered diverticulosis. Internal hemorrhoids were found. Biopsies from the stomach showed chronically minimally active gastritis with focal intestinal metaplasia. No H. pylori. Sigmoid polyp was hyperplastic.  He reports that his abdominal pain has slowly improved. He still has some intermittent pressure in his left midabdomen. This seems to be worse with carbonated beverages. He is tried to completely cut this out of his diet. He is back to exercising and overall feels better. Most recently he's reporting some sinus pressure and drainage. Bowel movements have been regular without blood or melena. Appetite has returned to more normal. No nausea or vomiting. No bleeding, lower extremity swelling or increasing abdominal girth. No jaundice.  Review of Systems As per history of present illness, otherwise negative  Current Medications, Allergies, Past Medical History, Past Surgical History, Family History and Social History were reviewed in [employer] record.     Objective:   Physical  Exam BP 120/64 (BP Location: Left Wrist, Patient Position: Sitting, Cuff Size: Normal)   Pulse 72   Ht 5' 10"  (1.778 m)   Wt (!) 333 lb (151 kg)   BMI 47.78 kg/m  Constitutional: Well-developed and well-nourished. No distress. HEENT: Normocephalic and atraumatic. Oropharynx is clear and moist. No oropharyngeal exudate. Conjunctivae are normal.  No scleral icterus. Neck: Neck supple. Trachea midline. Cardiovascular: Normal rate, regular rhythm and intact distal pulses. No M/R/G Pulmonary/chest: Effort normal and breath sounds normal. No wheezing, rales or rhonchi. Abdominal: Soft, obese, mild tenderness b/l  Without rebound or guarding, nondistended. Bowel sounds active throughout.  Extremities: no clubbing, cyanosis, trace pretib edema. Lymphadenopathy: No cervical adenopathy noted. Neurological: Alert and oriented to person place and time. Skin: Skin is warm and dry. No rashes noted. Psychiatric: Normal mood and affect. Behavior is normal.  CBC    Component Value Date/Time   WBC 4.8 09/06/2015 1000   RBC 5.15 09/06/2015 1000   HGB 13.8 09/06/2015 1000   HCT 41.7 09/06/2015 1000   PLT 138 (L) 09/06/2015 1000   MCV 81.0 09/06/2015 1000   MCV 88.0 08/04/2010   MCH 26.8 09/06/2015 1000   MCHC 33.1 09/06/2015 1000   RDW 13.1 09/06/2015 1000   LYMPHSABS 0.7 09/06/2015 1000   MONOABS 0.5 09/06/2015 1000   EOSABS 0.1 09/06/2015 1000   BASOSABS 0.0 09/06/2015 1000   CMP     Component Value Date/Time   NA 133 (L) 09/06/2015 1000   NA 139 08/04/2010   K 3.9 09/06/2015 1000   K 4.4 08/04/2010   CL 99 (L) 09/06/2015 1000   CO2 26 09/06/2015 1000   GLUCOSE 329 (H) 09/06/2015 1000   BUN 11 09/06/2015 1000  CREATININE 0.94 09/06/2015 1000   CREATININE 1.08 08/04/2010   CALCIUM 9.1 09/06/2015 1000   PROT 6.0 (L) 09/06/2015 1000   ALBUMIN 3.5 09/06/2015 1000   AST 43 (H) 09/06/2015 1000   AST 47 08/04/2010   ALT 38 09/06/2015 1000   ALT 28 08/03/2015 1205   ALKPHOS 157 (H)  09/06/2015 1000   BILITOT 1.5 (H) 09/06/2015 1000   GFRNONAA >60 09/06/2015 1000   GFRAA >60 09/06/2015 1000   Lab Results  Component Value Date   INR 1.2 (H) 08/03/2015   INR 1.13 05/29/2015   INR 1.20 03/23/2015       Assessment & Plan:  60 year old male with a past medical history of GERD, chronic gastroparesis, adenomatous colon polyps, diverticulosis, probable cirrhosis (at least advanced fibrosis) most likely secondary to nonalcoholic fatty liver disease who is here for follow-up.  1. Abdominal pain -- unclear etiology but fortunately improving. I've encouraged him to avoid carbonated beverages. I think diet, weight loss and exercise will lead to further improvement. No pathology to explain pain on multiple CT scans of the abdomen and pelvis, EGD and colonoscopy. I provided reassurance for this issue which is improving.  2. Cirrhosis -- likely NASH related. We had a long discussion today regarding cirrhosis. He has mild elevation in bilirubin, splenomegaly with mildly low platelets, slightly elevated INR and advanced fibrosis by fibrosure test. We discussed all these things today. He has had variceal screening and [place] screening. We discussed the importance of low sodium diet, diet and exercise. We discussed modifying risk factors such as hypertension, hyperlipidemia and diabetes. He works closely with Dr. Danise Mina with these issues. No other source for cirrhosis found on previous blood work --[facility] screening -- up-to-date given numerous CT scans recently, plan repeat [place] screening ultrasound in summer 2018 --Variceal screening -- up-to-date, repeat EGD in 3 years, sooner if decompensation --Flu vaccine annually, already received Pneumovax, to begin hepatitis a and B vaccine series today  3. History of adenomatous colon polyps -- repeat colonoscopy in 5 years  25 minutes spent with the patient today. Greater than 50% was spent in counseling and coordination of care with the patient

## 2015-10-07 NOTE — Patient Instructions (Addendum)
We have given you your first Hepatitis A/B injection today.  You are scheduled for your 2nd hepatitis A/B injection on 11/04/15 @9 :00 am.  Please follow low fat diet and get plenty of exercise.  Avoid carbonated beverages.  Please follow up with Dr Hilarie Fredrickson in 6 months.  If you are age 60 or older, your body mass index should be between 23-30. Your Body mass index is 47.78 kg/m. If this is out of the aforementioned range listed, please consider follow up with your Primary Care Provider.  If you are age 43 or younger, your body mass index should be between 19-25. Your Body mass index is 47.78 kg/m. If this is out of the aformentioned range listed, please consider follow up with your Primary Care Provider.

## 2015-10-09 ENCOUNTER — Encounter: Payer: Self-pay | Admitting: Family Medicine

## 2015-10-09 ENCOUNTER — Ambulatory Visit (INDEPENDENT_AMBULATORY_CARE_PROVIDER_SITE_OTHER): Payer: Medicare Other | Admitting: Family Medicine

## 2015-10-09 VITALS — BP 120/72 | HR 70 | Temp 97.5°F | Wt 331.5 lb

## 2015-10-09 DIAGNOSIS — R05 Cough: Secondary | ICD-10-CM

## 2015-10-09 DIAGNOSIS — I2510 Atherosclerotic heart disease of native coronary artery without angina pectoris: Secondary | ICD-10-CM | POA: Diagnosis not present

## 2015-10-09 DIAGNOSIS — E113599 Type 2 diabetes mellitus with proliferative diabetic retinopathy without macular edema, unspecified eye: Secondary | ICD-10-CM

## 2015-10-09 DIAGNOSIS — E1165 Type 2 diabetes mellitus with hyperglycemia: Secondary | ICD-10-CM

## 2015-10-09 DIAGNOSIS — R059 Cough, unspecified: Secondary | ICD-10-CM

## 2015-10-09 DIAGNOSIS — J069 Acute upper respiratory infection, unspecified: Secondary | ICD-10-CM | POA: Diagnosis not present

## 2015-10-09 MED ORDER — AMOXICILLIN-POT CLAVULANATE 875-125 MG PO TABS: 1.0000 | ORAL_TABLET | Freq: Two times a day (BID) | ORAL | 0 refills | Status: DC

## 2015-10-09 MED ORDER — HYDROCOD POLST-CPM POLST ER 10-8 MG/5ML PO SUER: 5.0000 mL | Freq: Two times a day (BID) | ORAL | 0 refills | Status: DC | PRN
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
## 2015-10-09 NOTE — Patient Instructions (Signed)
You can take plain mucinex to help loosen the phlegm Drink lots of water  Upper Respiratory Infection, Adult Most upper respiratory infections (URIs) are a viral infection of the air passages leading to the lungs. A URI affects the nose, throat, and upper air passages. The most common type of URI is nasopharyngitis and is typically referred to as "the common cold." URIs run their course and usually go away on their own. Most of the time, a URI does not require medical attention, but sometimes a bacterial infection in the upper airways can follow a viral infection. This is called a secondary infection. Sinus and middle ear infections are common types of secondary upper respiratory infections. Bacterial pneumonia can also complicate a URI. A URI can worsen asthma and chronic obstructive pulmonary disease (COPD). Sometimes, these complications can require emergency medical care and may be life threatening.  CAUSES Almost all URIs are caused by viruses. A virus is a type of germ and can spread from one person to another.  RISKS FACTORS You may be at risk for a URI if:   You smoke.   You have chronic heart or lung disease.  You have a weakened defense (immune) system.   You are very young or very old.   You have nasal allergies or asthma.  You work in crowded or poorly ventilated areas.  You work in health care facilities or schools. SIGNS AND SYMPTOMS  Symptoms typically develop 2-3 days after you come in contact with a cold virus. Most viral URIs last 7-10 days. However, viral URIs from the influenza virus (flu virus) can last 14-18 days and are typically more severe. Symptoms may include:   Runny or stuffy (congested) nose.   Sneezing.   Cough.   Sore throat.   Headache.   Fatigue.   Fever.   Loss of appetite.   Pain in your forehead, behind your eyes, and over your cheekbones (sinus pain).  Muscle aches.  DIAGNOSIS  Your health care provider may diagnose a  URI by:  Physical exam.  Tests to check that your symptoms are not due to another condition such as:  Strep throat.  Sinusitis.  Pneumonia.  Asthma. TREATMENT  A URI goes away on its own with time. It cannot be cured with medicines, but medicines may be prescribed or recommended to relieve symptoms. Medicines may help:  Reduce your fever.  Reduce your cough.  Relieve nasal congestion. HOME CARE INSTRUCTIONS   Take medicines only as directed by your health care provider.   Gargle warm saltwater or take cough drops to comfort your throat as directed by your health care provider.  Use a warm mist humidifier or inhale steam from a shower to increase air moisture. This may make it easier to breathe.  Drink enough fluid to keep your urine clear or pale yellow.   Eat soups and other clear broths and maintain good nutrition.   Rest as needed.   Return to work when your temperature has returned to normal or as your health care provider advises. You may need to stay home longer to avoid infecting others. You can also use a face mask and careful hand washing to prevent spread of the virus.  Increase the usage of your inhaler if you have asthma.   Do not use any tobacco products, including cigarettes, chewing tobacco, or electronic cigarettes. If you need help quitting, ask your health care provider. PREVENTION  The best way to protect yourself from getting a cold is  to practice good hygiene.   Avoid oral or hand contact with people with cold symptoms.   Wash your hands often if contact occurs.  There is no clear evidence that vitamin C, vitamin E, echinacea, or exercise reduces the chance of developing a cold. However, it is always recommended to get plenty of rest, exercise, and practice good nutrition.  SEEK MEDICAL CARE IF:   You are getting worse rather than better.   Your symptoms are not controlled by medicine.   You have chills.  You have worsening  shortness of breath.  You have brown or red mucus.  You have yellow or brown nasal discharge.  You have pain in your face, especially when you bend forward.  You have a fever.  You have swollen neck glands.  You have pain while swallowing.  You have white areas in the back of your throat. SEEK IMMEDIATE MEDICAL CARE IF:   You have severe or persistent:  Headache.  Ear pain.  Sinus pain.  Chest pain.  You have chronic lung disease and any of the following:  Wheezing.  Prolonged cough.  Coughing up blood.  A change in your usual mucus.  You have a stiff neck.  You have changes in your:  Vision.  Hearing.  Thinking.  Mood. MAKE SURE YOU:   Understand these instructions.  Will watch your condition.  Will get help right away if you are not doing well or get worse.   This information is not intended to replace advice given to you by your health care provider. Make sure you discuss any questions you have with your health care provider.   Document Released: 08/03/2000 Document Revised: 06/24/2014 Document Reviewed: 05/15/2013 Elsevier Interactive Patient Education [employer].

## 2015-10-09 NOTE — Progress Notes (Signed)
Pre-visit discussion using our clinic review tool. No additional management support is needed unless otherwise documented below in the visit note.  

## 2015-10-13 ENCOUNTER — Encounter: Payer: Self-pay | Admitting: Family Medicine

## 2015-10-13 ENCOUNTER — Ambulatory Visit (INDEPENDENT_AMBULATORY_CARE_PROVIDER_SITE_OTHER): Payer: Medicare Other | Admitting: Family Medicine

## 2015-10-13 ENCOUNTER — Telehealth: Payer: Self-pay | Admitting: Family Medicine

## 2015-10-13 DIAGNOSIS — I2510 Atherosclerotic heart disease of native coronary artery without angina pectoris: Secondary | ICD-10-CM

## 2015-10-13 DIAGNOSIS — J069 Acute upper respiratory infection, unspecified: Secondary | ICD-10-CM | POA: Diagnosis not present

## 2015-10-13 MED ORDER — AZITHROMYCIN 250 MG PO TABS: ORAL_TABLET | ORAL | 0 refills | Status: DC

## 2015-10-13 MED ORDER — BENZONATATE 200 MG PO CAPS: 200.0000 mg | ORAL_CAPSULE | Freq: Two times a day (BID) | ORAL | 0 refills | Status: DC | PRN

## 2015-10-13 NOTE — Telephone Encounter (Signed)
[facility]  Patient Name: Chad [name]  DOB: [birth year]    Initial Comment caller states husband coughing and c/o tasting blood   Nurse Assessment  Nurse: Wynetta Emery, RN, Baker Janus Date/Time Eilene Ghazi Time): 10/13/2015 9:30:56 AM  Confirm and document reason for call. If symptomatic, describe symptoms. You must click the next button to save text entered. ---[name] is coughing so hard that he tastes blood and is yellow sputum -- chest hurting from the coughing. Cough medication and antibiotic given Friday when seen  Has the patient traveled out of the country within the last 30 days? ---No  Does the patient have any new or worsening symptoms? ---Yes  Will a triage be completed? ---Yes  Related visit to physician within the last 2 weeks? ---No  Does the PT have any chronic conditions? (i.e. diabetes, asthma, etc.) ---Unknown  Is this a behavioral health or substance abuse call? ---No     Guidelines    Guideline Title Affirmed Question Affirmed Notes  Influenza Follow-up Call [1] Fever returns after gone for over 24 hours AND [2] symptoms worse or not improved    Final Disposition User   See Physician within [place], RN, [facility]    Referrals  REFERRED TO PCP OFFICE   Disagree/Comply: Leta Baptist

## 2015-10-13 NOTE — Telephone Encounter (Signed)
Pt was seen 10/13/15 by Dr Deborra Medina.

## 2015-10-13 NOTE — Progress Notes (Signed)
Pre visit review using our clinic review tool, if applicable. No additional management support is needed unless otherwise documented below in the visit note. 

## 2015-10-13 NOTE — Assessment & Plan Note (Signed)
Based on previous office notes, lung exam is worse. Symptoms are also worse. Will d/c Augmentin, start Zpack. Tessalon as needed during the day since tussionex is causing sedation. The patient indicates understanding of these issues and agrees with the plan.

## 2015-10-13 NOTE — Patient Instructions (Signed)
Good to see you. Please stop taking Augmentin. Start taking Zpack as directed.  Tessalon perles as needed for cough during the day, ok to continue your inhaler and tussionex (cough medicine at bedtime).  Please keep [place] updated.
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
## 2015-10-22 ENCOUNTER — Telehealth: Payer: Self-pay

## 2015-10-22 NOTE — Telephone Encounter (Signed)
allergy to statins.

## 2015-10-22 NOTE — Telephone Encounter (Signed)
Chad [name] from [place] left v/m; [facility] ins noted that pt is diabetic and not on a statin. Pharmacy wants to know if pt being placed on statin is an option or not.Appears per med list Pt is on [place].Please advise.

## 2015-10-23 NOTE — Telephone Encounter (Signed)
Left detailed message on vm at pharmacy

## 2015-10-27 ENCOUNTER — Encounter: Payer: Self-pay | Admitting: Family Medicine

## 2015-10-27 ENCOUNTER — Ambulatory Visit (INDEPENDENT_AMBULATORY_CARE_PROVIDER_SITE_OTHER): Payer: Medicare Other | Admitting: Family Medicine

## 2015-10-27 VITALS — BP 130/74 | HR 58 | Temp 98.5°F | Wt 333.5 lb

## 2015-10-27 DIAGNOSIS — I2510 Atherosclerotic heart disease of native coronary artery without angina pectoris: Secondary | ICD-10-CM | POA: Diagnosis not present

## 2015-10-27 DIAGNOSIS — K7460 Unspecified cirrhosis of liver: Secondary | ICD-10-CM

## 2015-10-27 DIAGNOSIS — E1165 Type 2 diabetes mellitus with hyperglycemia: Secondary | ICD-10-CM

## 2015-10-27 DIAGNOSIS — Z23 Encounter for immunization: Secondary | ICD-10-CM | POA: Diagnosis not present

## 2015-10-27 DIAGNOSIS — K7581 Nonalcoholic steatohepatitis (NASH): Secondary | ICD-10-CM

## 2015-10-27 DIAGNOSIS — E113599 Type 2 diabetes mellitus with proliferative diabetic retinopathy without macular edema, unspecified eye: Secondary | ICD-10-CM

## 2015-10-27 DIAGNOSIS — I10 Essential (primary) hypertension: Secondary | ICD-10-CM

## 2015-10-27 DIAGNOSIS — J069 Acute upper respiratory infection, unspecified: Secondary | ICD-10-CM

## 2015-10-27 DIAGNOSIS — E785 Hyperlipidemia, unspecified: Secondary | ICD-10-CM | POA: Diagnosis not present

## 2015-10-27 DIAGNOSIS — K7469 Other cirrhosis of liver: Secondary | ICD-10-CM

## 2015-10-27 NOTE — Progress Notes (Signed)
Pre visit review using our clinic review tool, if applicable. No additional management support is needed unless otherwise documented below in the visit note. 

## 2015-10-27 NOTE — Assessment & Plan Note (Addendum)
Chronic, improving slowly with addition of medications. Not yet due for A1c. Pt noticing improving sugars with metformin/amaryl.   Will call to schedule diabetic eye exam.

## 2015-10-27 NOTE — Assessment & Plan Note (Signed)
Chronic, stable. Continue current meds. Mild bradycardia noted - continue to monitor

## 2015-10-27 NOTE — Addendum Note (Signed)
Addended by: Modena Nunnery on: 10/27/2015 10:13 AM   Modules accepted: Orders

## 2015-10-27 NOTE — Assessment & Plan Note (Signed)
Vit E started. Appreciate GI care.

## 2015-10-27 NOTE — Assessment & Plan Note (Signed)
Resolved. Persistent cough. Discussed. Ok for flu shot.

## 2015-10-27 NOTE — Patient Instructions (Addendum)
Flu shot today. Call to schedule diabetic eye exam.  Good to see you today. Continue current medicines.  Return in 2 months for follow up diabetes.

## 2015-10-27 NOTE — Assessment & Plan Note (Signed)
Pt reports ongoing compliance with RYR.

## 2015-10-27 NOTE — Progress Notes (Signed)
BP 130/74   Pulse (!) 58   Temp 98.5 F (36.9 C) (Oral)   Wt (!) 333 lb 8 oz (151.3 kg)   SpO2 97%   BMI 47.85 kg/m    CC: 6 wk f/u visit Subjective:    Patient ID: Chad [name], male    DOB: 05/14/1955, 60 y.o.   MRN: [id number]  HPI: Chad [name] is a 60 y.o. male presenting on 10/27/2015 for Follow-up (6 weeks)   Seen last month x2 for URI - treated with augmentin --> zpack.   DM - regularly does check sugars 130-150 fasting. Forgot log. Compliant with antihyperglycemic regimen which includes: metformin 543m bid, amaryl 155mdaily with breakfast. Denies low sugars or hypoglycemic symptoms. Denies paresthesias - last visit we changed gabapentin to PRN. Last diabetic eye exam DUE.  Pneumovax: 2013.  Prevnar: DUE. Lab Results  Component Value Date   HGBA1C 9.0 (H) 09/15/2015   Diabetic Foot Exam - Simple   No data filed    not done.  HLD - restarted RYR and tolerating well.   Presumed NASH cirrhosis - saw Dr PyHilarie FredricksonStarted on vit E. Latest note reviewed.   Relevant past medical, surgical, family and social history reviewed and updated as indicated. Interim medical history since our last visit reviewed. Allergies and medications reviewed and updated. Current Outpatient Prescriptions on File Prior to Visit  Medication Sig  . aspirin 81 MG tablet Take 162 mg by mouth every morning.   . chlorpheniramine-HYDROcodone (TUSSIONEX PENNKINETIC ER) 10-8 MG/5ML SUER Take 5 mLs by mouth every 12 (twelve) hours as needed for cough.  . Mariane Baumgartenodium (DSS) 100 MG CAPS 1 tab 2 times a day while on narcotics.  STOOL SOFTENER (Patient taking differently: Take 100 mg by mouth every morning. )  . Flaxseed, Linseed, (BL FLAX SEED OIL PO) Take 1,200 mg by mouth 2 (two) times daily.  . furosemide (LASIX) 20 MG tablet TAKE 1 TABLET BY MOUTH TWICE A DAY AS NEEDED (Patient taking differently: TAKE 1 TABLET BY MOUTH TWICE A DAY AS NEEDED FOR FLUID)  . gabapentin (NEURONTIN) 300 MG capsule Take  300 mg by mouth as needed.   . Marland Kitchenlimepiride (AMARYL) 1 MG tablet Take 1 tablet (1 mg total) by mouth daily with breakfast.  . HYDROcodone-acetaminophen (NORCO/VICODIN) 5-325 MG tablet Take 1 tablet by mouth every 6 (six) hours as needed for moderate pain.  . Marland Kitchenevothyroxine (SYNTHROID, LEVOTHROID) 100 MCG tablet TAKE 1 TABLET BY MOUTH ONCE A DAY  . metFORMIN (GLUCOPHAGE) 500 MG tablet Take 1 tablet (500 mg total) by mouth 2 (two) times daily with a meal.  . metoCLOPramide (REGLAN) 10 MG tablet TAKE 1 TABLET BY MOUTH 3 TIMES DAILY BEFORE MEALS (Patient taking differently: TAKE 1 TABLET BY MOUTH 3 TIMES DAILY BEFORE MEALS AS NEEDED FOR GURD)  . metoprolol tartrate (LOPRESSOR) 25 MG tablet TAKE 1 TABLET BY MOUTH TWICE A DAY  . Multiple Vitamin (MULITIVITAMIN WITH MINERALS) TABS Take 1 tablet by mouth daily.  . Marland KitchenEXIUM 40 MG capsule TAKE 1 CAPSULE BY MOUTH TWICE DAILY  . nitroGLYCERIN (NITROSTAT) 0.4 MG SL tablet Place 1 tablet (0.4 mg total) under the tongue every 5 (five) minutes as needed for chest pain.  . Marland Kitchenmega-3 acid ethyl esters (LOVAZA) 1 g capsule Take 1 g by mouth 2 (two) times daily.   . potassium chloride (K-DUR) 10 MEQ tablet TAKE 1 TABLET BY MOUTH TWICE A DAY AS NEEDED (Patient taking differently: TAKE 1 TABLET BY MOUTH  TWICE A DAY AS NEEDED WHEN TAKING LASIX)  . Red Yeast Rice 600 MG CAPS Take 1 capsule (600 mg total) by mouth 2 (two) times daily.  . traMADol (ULTRAM) 50 MG tablet Take 1 tablet (50 mg total) by mouth 2 (two) times daily as needed.  . VENTOLIN HFA 108 (90 Base) MCG/ACT inhaler INHALE 2 PUFFS INTO THE LUNGS EVERY 6 HOURS AS NEEDED FOR WHEEZING ORSHORTNESS OF BREATH   No current facility-administered medications on file prior to visit.     Review of Systems Per HPI unless specifically indicated in ROS section     Objective:    BP 130/74   Pulse (!) 58   Temp 98.5 F (36.9 C) (Oral)   Wt (!) 333 lb 8 oz (151.3 kg)   SpO2 97%   BMI 47.85 kg/m   Wt Readings from  Last 3 Encounters:  10/27/15 (!) 333 lb 8 oz (151.3 kg)  10/13/15 (!) 330 lb 8 oz (149.9 kg)  10/09/15 (!) 331 lb 8 oz (150.4 kg)    Physical Exam  Constitutional: He appears well-developed and well-nourished. No distress.  HENT:  Head: Normocephalic and atraumatic.  Right Ear: External ear normal.  Left Ear: External ear normal.  Nose: Nose normal.  Mouth/Throat: Oropharynx is clear and moist. No oropharyngeal exudate.  Eyes: Conjunctivae and EOM are normal. Pupils are equal, round, and reactive to light. No scleral icterus.  Neck: Normal range of motion. Neck supple.  Cardiovascular: Normal rate, regular rhythm, normal heart sounds and intact distal pulses.   No murmur heard. Pulmonary/Chest: Effort normal and breath sounds normal. No respiratory distress. He has no wheezes. He has no rales.  Musculoskeletal: He exhibits no edema.  See HPI for foot exam if done  Lymphadenopathy:    He has no cervical adenopathy.  Skin: Skin is warm and dry. No rash noted.  Psychiatric: He has a normal mood and affect.  Nursing note and vitals reviewed.  Results for orders placed or performed in visit on 09/15/15  Hemoglobin A1c  Result Value Ref Range   Hgb A1c MFr Bld 9.0 (H) 4.6 - 6.5 %  Microalbumin / creatinine urine ratio  Result Value Ref Range   Microalb, Ur <0.7 0.0 - 1.9 mg/dL   Creatinine,U 98.4 mg/dL   Microalb Creat Ratio 0.7 0.0 - 30.0 mg/g  TSH  Result Value Ref Range   TSH 2.09 0.35 - 4.50 uIU/mL      Assessment & Plan:   Problem List Items Addressed This Visit    Acute upper respiratory infection    Resolved. Persistent cough. Discussed. Ok for flu shot.       HLD (hyperlipidemia)    Pt reports ongoing compliance with RYR.       HTN (hypertension)    Chronic, stable. Continue current meds. Mild bradycardia noted - continue to monitor      Liver cirrhosis secondary to NASH    Vit E started. Appreciate GI care.      Type 2 diabetes mellitus, uncontrolled,  with retinopathy ([place]) - Primary    Chronic, improving slowly with addition of medications. Not yet due for A1c. Pt noticing improving sugars with metformin/amaryl.   Will call to schedule diabetic eye exam.        Other Visit Diagnoses   None.      Follow up plan: Return in about 2 months (around 12/27/2015) for follow up visit.  [name], MD

## 2015-11-04 ENCOUNTER — Ambulatory Visit (INDEPENDENT_AMBULATORY_CARE_PROVIDER_SITE_OTHER): Payer: Medicare Other | Admitting: Internal Medicine

## 2015-11-04 DIAGNOSIS — R935 Abnormal findings on diagnostic imaging of other abdominal regions, including retroperitoneum: Secondary | ICD-10-CM | POA: Diagnosis not present

## 2015-11-04 DIAGNOSIS — Z23 Encounter for immunization: Secondary | ICD-10-CM

## 2015-11-04 DIAGNOSIS — R109 Unspecified abdominal pain: Secondary | ICD-10-CM | POA: Diagnosis not present

## 2015-11-24 ENCOUNTER — Encounter: Payer: Self-pay | Admitting: Family Medicine

## 2015-11-24 ENCOUNTER — Ambulatory Visit (INDEPENDENT_AMBULATORY_CARE_PROVIDER_SITE_OTHER): Payer: Medicare Other | Admitting: Family Medicine

## 2015-11-24 VITALS — BP 138/72 | HR 82 | Temp 97.5°F | Wt 333.0 lb

## 2015-11-24 DIAGNOSIS — R1084 Generalized abdominal pain: Secondary | ICD-10-CM | POA: Diagnosis not present

## 2015-11-24 DIAGNOSIS — I2510 Atherosclerotic heart disease of native coronary artery without angina pectoris: Secondary | ICD-10-CM | POA: Diagnosis not present

## 2015-11-24 DIAGNOSIS — N2889 Other specified disorders of kidney and ureter: Secondary | ICD-10-CM | POA: Diagnosis not present

## 2015-11-24 LAB — POC URINALSYSI DIPSTICK (AUTOMATED)
Bilirubin, UA: NEGATIVE
Blood, UA: NEGATIVE
GLUCOSE UA: NEGATIVE
KETONES UA: NEGATIVE
Nitrite, UA: NEGATIVE
PROTEIN UA: NEGATIVE
Spec Grav, UA: >=1.03
Urobilinogen, UA: 1
pH, UA: 6

## 2015-11-24 MED ORDER — VENLAFAXINE HCL 75 MG PO TABS: 75.0000 mg | ORAL_TABLET | Freq: Every day | ORAL | 1 refills | Status: DC

## 2015-11-24 NOTE — Assessment & Plan Note (Signed)
Unclear etiology s/p extensive GI evaluation this summer with multiple CT scans, colonoscopy, EGD.  ?thoracic radiculopathy with referred pain vs adhesion related (h/o cholecystectomy and bariatric surgery status).  Latest CT scan reviewed - DISH of thoracic spine, mild bilateral pelvicaliectasis with R sided ureterectasis, at that time UA WNL. Will update UA and check renal US to further evaluate this.  ?body aches from red yeast rice - rec hold RYR for now. rec retrial gabapentin.  ?anxiety contribution - if no improvement, consider trial effexor. Rx sent to pharmacy.

## 2015-11-24 NOTE — Patient Instructions (Addendum)
Urinalysis today. We will check kidney ultrasound as well. Re-try gabapentin.  Stop red yeast rice. If no better, start effexor 86m daily. If no better with this, we will consider MRI of thoracic and lumbar spine to check for spinal cause of your abdominal pain.

## 2015-11-24 NOTE — Progress Notes (Signed)
BP 138/72   Pulse 82   Temp 97.5 F (36.4 C) (Oral)   Wt (!) 333 lb (151 kg)   BMI 47.78 kg/m    CC: side pain Subjective:    Patient ID: Chad [name], male    DOB: Nov 28, 1955, 59 y.o.   MRN: [id number]  HPI: Chad [name] is a 60 y.o. male presenting on 11/24/2015 for pain in both sides   H/o unexplained abdominal pain over summer s/p unrevealing GI evaluation with CT scans, EGD, colonoscopy.   Abdominal pain largely resolved over the summer, but then over the last month it has recurred, acutely worse over the past week. Points to bilateral sides with radiation to mid lower abdomen, occasional lower back pain, describes sharp stabbing pain leading to sore burning pain "like pouring alcohol on a wound". Laying down has helped in the past. Mild diarrhea intermittently. Pain can affect sleep and ambulation.   Denies fevers/chills, nausea/vomiting, constipation, urine symptoms like dysuria or hematuria. Voiding regularly, feels completely empties. He takes lasix MWF.   Tramadol "taking like candy" without improvement. Gabapentin initially effective then wore off.   Relevant past medical, surgical, family and social history reviewed and updated as indicated. Interim medical history since our last visit reviewed. Allergies and medications reviewed and updated. Current Outpatient Prescriptions on File Prior to Visit  Medication Sig  . aspirin 81 MG tablet Take 162 mg by mouth every morning.   . chlorpheniramine-HYDROcodone (TUSSIONEX PENNKINETIC ER) 10-8 MG/5ML SUER Take 5 mLs by mouth every 12 (twelve) hours as needed for cough.  Mariane Baumgarten Sodium (DSS) 100 MG CAPS 1 tab 2 times a day while on narcotics.  STOOL SOFTENER (Patient taking differently: Take 100 mg by mouth every morning. )  . Flaxseed, Linseed, (BL FLAX SEED OIL PO) Take 1,200 mg by mouth 2 (two) times daily.  . furosemide (LASIX) 20 MG tablet TAKE 1 TABLET BY MOUTH TWICE A DAY AS NEEDED (Patient taking differently: TAKE 1  TABLET BY MOUTH TWICE A DAY AS NEEDED FOR FLUID)  . gabapentin (NEURONTIN) 300 MG capsule Take 300 mg by mouth as needed.   Marland Kitchen glimepiride (AMARYL) 1 MG tablet Take 1 tablet (1 mg total) by mouth daily with breakfast.  . HYDROcodone-acetaminophen (NORCO/VICODIN) 5-325 MG tablet Take 1 tablet by mouth every 6 (six) hours as needed for moderate pain.  Marland Kitchen levothyroxine (SYNTHROID, LEVOTHROID) 100 MCG tablet TAKE 1 TABLET BY MOUTH ONCE A DAY  . metFORMIN (GLUCOPHAGE) 500 MG tablet Take 1 tablet (500 mg total) by mouth 2 (two) times daily with a meal.  . metoCLOPramide (REGLAN) 10 MG tablet TAKE 1 TABLET BY MOUTH 3 TIMES DAILY BEFORE MEALS (Patient taking differently: TAKE 1 TABLET BY MOUTH 3 TIMES DAILY BEFORE MEALS AS NEEDED FOR GURD)  . metoprolol tartrate (LOPRESSOR) 25 MG tablet TAKE 1 TABLET BY MOUTH TWICE A DAY  . Multiple Vitamin (MULITIVITAMIN WITH MINERALS) TABS Take 1 tablet by mouth daily.  Marland Kitchen NEXIUM 40 MG capsule TAKE 1 CAPSULE BY MOUTH TWICE DAILY  . nitroGLYCERIN (NITROSTAT) 0.4 MG SL tablet Place 1 tablet (0.4 mg total) under the tongue every 5 (five) minutes as needed for chest pain.  Marland Kitchen omega-3 acid ethyl esters (LOVAZA) 1 g capsule Take 1 g by mouth 2 (two) times daily.   . potassium chloride (K-DUR) 10 MEQ tablet TAKE 1 TABLET BY MOUTH TWICE A DAY AS NEEDED (Patient taking differently: TAKE 1 TABLET BY MOUTH TWICE A DAY AS NEEDED WHEN TAKING  LASIX)  . Red Yeast Rice 600 MG CAPS Take 1 capsule (600 mg total) by mouth 2 (two) times daily.  . traMADol (ULTRAM) 50 MG tablet Take 1 tablet (50 mg total) by mouth 2 (two) times daily as needed.  . VENTOLIN HFA 108 (90 Base) MCG/ACT inhaler INHALE 2 PUFFS INTO THE LUNGS EVERY 6 HOURS AS NEEDED FOR WHEEZING ORSHORTNESS OF BREATH  . vitamin E (VITAMIN E) 400 UNIT capsule Take 400 Units by mouth daily.   No current facility-administered medications on file prior to visit.     Review of Systems Per HPI unless specifically indicated in ROS  section     Objective:    BP 138/72   Pulse 82   Temp 97.5 F (36.4 C) (Oral)   Wt (!) 333 lb (151 kg)   BMI 47.78 kg/m   Wt Readings from Last 3 Encounters:  11/24/15 (!) 333 lb (151 kg)  10/27/15 (!) 333 lb 8 oz (151.3 kg)  10/13/15 (!) 330 lb 8 oz (149.9 kg)    Physical Exam  Constitutional: He appears well-developed and well-nourished. No distress.  HENT:  Mouth/Throat: Oropharynx is clear and moist. No oropharyngeal exudate.  Eyes: Conjunctivae are normal. Pupils are equal, round, and reactive to light.  Neck: Normal range of motion. Neck supple.  Abdominal: Soft. Normal appearance and bowel sounds are normal. He exhibits no distension and no mass. There is no hepatosplenomegaly. There is generalized tenderness. There is no rigidity, no rebound, no guarding, no CVA tenderness and negative Murphy's sign. No hernia. Hernia confirmed negative in the right inguinal area and confirmed negative in the left inguinal area.  Obese abdomen Marked tenderness to mild palpation throughout abdomen No hernia appreciated, but difficult exam due to body habitus  Genitourinary: Testes normal. Right testis shows no mass, no swelling and no tenderness. Left testis shows no mass, no swelling and no tenderness.  Musculoskeletal: He exhibits no edema.  Lymphadenopathy:       Right: No inguinal adenopathy present.       Left: No inguinal adenopathy present.  Skin: Skin is warm and dry. No rash noted.  Psychiatric: He has a normal mood and affect.  Nursing note and vitals reviewed.  Results for orders placed or performed in visit on 11/24/15  POCT Urinalysis Dipstick (Automated)  Result Value Ref Range   Color, UA Yellow    Clarity, UA Clear    Glucose, UA Negative    Bilirubin, UA Negative    Ketones, UA Negative    Spec Grav, UA >=1.030    Blood, UA Negative    pH, UA 6.0    Protein, UA Negative    Urobilinogen, UA 1.0    Nitrite, UA Negative    Leukocytes, UA small (1+) (A) Negative       Assessment & Plan:   Problem List Items Addressed This Visit    Abdominal discomfort, generalized - Primary    Unclear etiology s/p extensive GI evaluation this summer with multiple CT scans, colonoscopy, EGD.  ?thoracic radiculopathy with referred pain vs adhesion related (h/o cholecystectomy and bariatric surgery status).  Latest CT scan reviewed - DISH of thoracic spine, mild bilateral pelvicaliectasis with R sided ureterectasis, at that time UA WNL. Will update UA and check renal US to further evaluate this.  ?body aches from red yeast rice - rec hold RYR for now. rec retrial gabapentin.  ?anxiety contribution - if no improvement, consider trial effexor. Rx sent to pharmacy.  Relevant Orders   US Renal   POCT Urinalysis Dipstick (Automated) (Completed)   Urine culture    Other Visit Diagnoses    Pelviectasis of kidney       Relevant Orders   US Renal   POCT Urinalysis Dipstick (Automated) (Completed)   Urine culture       Follow up plan: No Follow-up on file.  [name], MD

## 2015-11-25 ENCOUNTER — Encounter: Payer: Self-pay | Admitting: Cardiovascular Disease

## 2015-11-25 ENCOUNTER — Ambulatory Visit (INDEPENDENT_AMBULATORY_CARE_PROVIDER_SITE_OTHER): Payer: Medicare Other | Admitting: Cardiovascular Disease

## 2015-11-25 VITALS — BP 120/60 | HR 62 | Ht 72.0 in | Wt 334.0 lb

## 2015-11-25 DIAGNOSIS — I2510 Atherosclerotic heart disease of native coronary artery without angina pectoris: Secondary | ICD-10-CM

## 2015-11-25 DIAGNOSIS — K7460 Unspecified cirrhosis of liver: Secondary | ICD-10-CM

## 2015-11-25 DIAGNOSIS — I440 Atrioventricular block, first degree: Secondary | ICD-10-CM | POA: Diagnosis not present

## 2015-11-25 DIAGNOSIS — R1084 Generalized abdominal pain: Secondary | ICD-10-CM

## 2015-11-25 DIAGNOSIS — I5032 Chronic diastolic (congestive) heart failure: Secondary | ICD-10-CM | POA: Diagnosis not present

## 2015-11-25 DIAGNOSIS — E1165 Type 2 diabetes mellitus with hyperglycemia: Secondary | ICD-10-CM

## 2015-11-25 DIAGNOSIS — I10 Essential (primary) hypertension: Secondary | ICD-10-CM

## 2015-11-25 DIAGNOSIS — E7800 Pure hypercholesterolemia, unspecified: Secondary | ICD-10-CM | POA: Diagnosis not present

## 2015-11-25 DIAGNOSIS — E113599 Type 2 diabetes mellitus with proliferative diabetic retinopathy without macular edema, unspecified eye: Secondary | ICD-10-CM

## 2015-11-25 DIAGNOSIS — K7581 Nonalcoholic steatohepatitis (NASH): Secondary | ICD-10-CM
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
## 2015-11-25 NOTE — Patient Instructions (Signed)

## 2015-11-26 LAB — URINE CULTURE: ORGANISM ID, BACTERIA: NO GROWTH

## 2015-11-27 ENCOUNTER — Ambulatory Visit
Admission: RE | Admit: 2015-11-27 | Discharge: 2015-11-27 | Disposition: A | Discharge status: Home / Self Care | Payer: Medicare Other | Source: Ambulatory Visit | Attending: Family Medicine | Admitting: Family Medicine

## 2015-11-27 DIAGNOSIS — R1084 Generalized abdominal pain: Secondary | ICD-10-CM | POA: Insufficient documentation

## 2015-11-27 DIAGNOSIS — N2889 Other specified disorders of kidney and ureter: Secondary | ICD-10-CM | POA: Insufficient documentation

## 2015-12-01 ENCOUNTER — Other Ambulatory Visit: Payer: Self-pay | Admitting: Family Medicine

## 2015-12-01 DIAGNOSIS — M546 Pain in thoracic spine: Secondary | ICD-10-CM

## 2015-12-02 ENCOUNTER — Telehealth: Payer: Self-pay | Admitting: Family Medicine

## 2015-12-02 MED ORDER — LORAZEPAM 1 MG PO TABS: 1.0000 mg | ORAL_TABLET | Freq: Two times a day (BID) | ORAL | 0 refills | Status: DC | PRN

## 2015-12-02 NOTE — Telephone Encounter (Signed)
Pt is scheduled Monday Oct 16th at 11am for his MRI. He is claustrophobic. Pt would like something called in to [place] to help him relax for his exam. Please advise.  Please call when med has been called in

## 2015-12-02 NOTE — Telephone Encounter (Signed)
May take ativan PRN claustrophobia. plz phone in.

## 2015-12-02 NOTE — Telephone Encounter (Signed)
Rx called in as directed. Patient notified.

## 2015-12-03 DIAGNOSIS — M1711 Unilateral primary osteoarthritis, right knee: Secondary | ICD-10-CM | POA: Diagnosis not present

## 2015-12-07 ENCOUNTER — Ambulatory Visit (HOSPITAL_COMMUNITY)
Admission: RE | Admit: 2015-12-07 | Discharge: 2015-12-07 | Disposition: A | Discharge status: Home / Self Care | Payer: Medicare Other | Source: Ambulatory Visit | Attending: Family Medicine | Admitting: Family Medicine

## 2015-12-07 DIAGNOSIS — M546 Pain in thoracic spine: Secondary | ICD-10-CM

## 2015-12-07 DIAGNOSIS — M4804 Spinal stenosis, thoracic region: Secondary | ICD-10-CM | POA: Diagnosis not present

## 2015-12-07 DIAGNOSIS — M5021 Other cervical disc displacement,  high cervical region: Secondary | ICD-10-CM | POA: Insufficient documentation

## 2015-12-07 DIAGNOSIS — M5134 Other intervertebral disc degeneration, thoracic region: Secondary | ICD-10-CM | POA: Insufficient documentation

## 2015-12-07 DIAGNOSIS — M8938 Hypertrophy of bone, other site: Secondary | ICD-10-CM | POA: Insufficient documentation

## 2015-12-07 DIAGNOSIS — M5124 Other intervertebral disc displacement, thoracic region: Secondary | ICD-10-CM | POA: Diagnosis not present

## 2015-12-09 ENCOUNTER — Encounter: Payer: Self-pay | Admitting: Emergency Medicine

## 2015-12-09 ENCOUNTER — Emergency Department
Admission: EM | Admit: 2015-12-09 | Discharge: 2015-12-09 | Disposition: A | Discharge status: Home / Self Care | Payer: Medicare Other | Attending: Emergency Medicine | Admitting: Emergency Medicine

## 2015-12-09 DIAGNOSIS — I2510 Atherosclerotic heart disease of native coronary artery without angina pectoris: Secondary | ICD-10-CM | POA: Insufficient documentation

## 2015-12-09 DIAGNOSIS — R55 Syncope and collapse: Secondary | ICD-10-CM | POA: Diagnosis not present

## 2015-12-09 DIAGNOSIS — R42 Dizziness and giddiness: Secondary | ICD-10-CM | POA: Diagnosis not present

## 2015-12-09 DIAGNOSIS — R0789 Other chest pain: Secondary | ICD-10-CM | POA: Diagnosis not present

## 2015-12-09 DIAGNOSIS — Z7984 Long term (current) use of oral hypoglycemic drugs: Secondary | ICD-10-CM | POA: Insufficient documentation

## 2015-12-09 DIAGNOSIS — I110 Hypertensive heart disease with heart failure: Secondary | ICD-10-CM | POA: Diagnosis not present

## 2015-12-09 DIAGNOSIS — Z7982 Long term (current) use of aspirin: Secondary | ICD-10-CM | POA: Insufficient documentation

## 2015-12-09 DIAGNOSIS — Z79899 Other long term (current) drug therapy: Secondary | ICD-10-CM | POA: Insufficient documentation

## 2015-12-09 DIAGNOSIS — E119 Type 2 diabetes mellitus without complications: Secondary | ICD-10-CM | POA: Insufficient documentation

## 2015-12-09 DIAGNOSIS — I5032 Chronic diastolic (congestive) heart failure: Secondary | ICD-10-CM | POA: Insufficient documentation

## 2015-12-09 DIAGNOSIS — R112 Nausea with vomiting, unspecified: Secondary | ICD-10-CM | POA: Diagnosis not present

## 2015-12-09 DIAGNOSIS — E039 Hypothyroidism, unspecified: Secondary | ICD-10-CM | POA: Diagnosis not present

## 2015-12-09 DIAGNOSIS — R079 Chest pain, unspecified: Secondary | ICD-10-CM

## 2015-12-09 LAB — COMPREHENSIVE METABOLIC PANEL
ALK PHOS: 127 U/L — AB (ref 38–126)
ALT: 48 U/L (ref 17–63)
AST: 56 U/L — AB (ref 15–41)
Albumin: 3.7 g/dL (ref 3.5–5.0)
Anion gap: 9 (ref 5–15)
BUN: 14 mg/dL (ref 6–20)
CALCIUM: 8.8 mg/dL — AB (ref 8.9–10.3)
CHLORIDE: 102 mmol/L (ref 101–111)
CO2: 25 mmol/L (ref 22–32)
CREATININE: 0.91 mg/dL (ref 0.61–1.24)
GFR calc Af Amer: >60 mL/min (ref >60)
Glucose, Bld: 295 mg/dL — ABNORMAL HIGH (ref 65–99)
Potassium: 3.5 mmol/L (ref 3.5–5.1)
SODIUM: 136 mmol/L (ref 135–145)
Total Bilirubin: 1 mg/dL (ref 0.3–1.2)
Total Protein: 6.6 g/dL (ref 6.5–8.1)

## 2015-12-09 LAB — CBC WITH DIFFERENTIAL/PLATELET
Basophils Absolute: 0 10*3/uL (ref 0–0.1)
Basophils Relative: 0 %
EOS PCT: 2 %
Eosinophils Absolute: 0.1 10*3/uL (ref 0–0.7)
HCT: 39.8 % — ABNORMAL LOW (ref 40.0–52.0)
Hemoglobin: 13.7 g/dL (ref 13.0–18.0)
LYMPHS ABS: 0.6 10*3/uL — AB (ref 1.0–3.6)
LYMPHS PCT: 12 %
MCH: 27.2 pg (ref 26.0–34.0)
MCHC: 34.5 g/dL (ref 32.0–36.0)
MCV: 79 fL — AB (ref 80.0–100.0)
MONO ABS: 0.5 10*3/uL (ref 0.2–1.0)
MONOS PCT: 10 %
Neutro Abs: 3.7 10*3/uL (ref 1.4–6.5)
Neutrophils Relative %: 76 %
PLATELETS: 105 10*3/uL — AB (ref 150–440)
RBC: 5.04 MIL/uL (ref 4.40–5.90)
RDW: 14.8 % — AB (ref 11.5–14.5)
WBC: 4.9 10*3/uL (ref 3.8–10.6)

## 2015-12-09 LAB — TROPONIN I
Troponin I: <0.03 ng/mL (ref <0.03)
Troponin I: <0.03 ng/mL (ref <0.03)

## 2015-12-09 LAB — LIPASE, BLOOD: Lipase: 32 U/L (ref 11–51)

## 2015-12-09 MED ORDER — GI COCKTAIL ~~LOC~~
30.0000 mL | ORAL | Status: AC
  Administered 2015-12-09: 30 mL via ORAL
  Filled 2015-12-09: qty 30

## 2015-12-09 MED ORDER — ONDANSETRON HCL 4 MG/2ML IJ SOLN
4.0000 mg | Freq: Once | INTRAMUSCULAR | Status: AC
  Administered 2015-12-09: 4 mg via INTRAVENOUS
  Filled 2015-12-09: qty 2

## 2015-12-09 MED ORDER — FAMOTIDINE 20 MG PO TABS
40.0000 mg | ORAL_TABLET | Freq: Once | ORAL | Status: AC
  Administered 2015-12-09: 40 mg via ORAL
  Filled 2015-12-09: qty 2

## 2015-12-09 NOTE — ED Triage Notes (Signed)
Pt arrived from home with c/o NV that started this morning. Pt told EMS that he had chest pain last night and upon waking this morning he became dizzy and had n/v. Pt presents to ED with complaints of n/v and currently states he is having chest pain.

## 2015-12-09 NOTE — Discharge Instructions (Signed)
You have been seen in the emergency department today for chest pain. Your workup has shown normal results. As we discussed please follow-up with your primary care physician in the next 1-2 days for recheck. Return to the emergency department for any further chest pain, trouble breathing, or any other symptom personally concerning to yourself.
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
## 2015-12-09 NOTE — ED Provider Notes (Signed)
-----------------------------------------   5:32 PM on 12/09/2015 -----------------------------------------  Patient's repeat troponin is negative. We will discharge the patient home with cardiology follow-up. Patient agreeable to plan.   Harvest Dark, MD 12/09/15 [phone]

## 2015-12-10 ENCOUNTER — Encounter: Payer: Self-pay | Admitting: Cardiovascular Disease

## 2015-12-10 ENCOUNTER — Ambulatory Visit (INDEPENDENT_AMBULATORY_CARE_PROVIDER_SITE_OTHER): Payer: Medicare Other | Admitting: Cardiovascular Disease

## 2015-12-10 ENCOUNTER — Telehealth: Payer: Self-pay | Admitting: Family Medicine

## 2015-12-10 VITALS — BP 168/84 | HR 65 | Ht 72.0 in | Wt 340.2 lb

## 2015-12-10 DIAGNOSIS — I2510 Atherosclerotic heart disease of native coronary artery without angina pectoris: Secondary | ICD-10-CM

## 2015-12-10 NOTE — Telephone Encounter (Signed)
Pt spouse returned Kim's call. Please advise
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
## 2015-12-10 NOTE — Patient Instructions (Addendum)
Medication Instructions:   Please naproxen two pills twice a day for back pain Ice packs to the back Nausea medication   Labwork:  No new labs needed  Testing/Procedures:  No further testing at this time   Follow-Up: It was a pleasure seeing you in the office today. Please call us if you have new issues that need to be addressed before your next appt.  [phone]  Your physician wants you to follow-up in: 6 months.  You will receive a reminder letter in the mail two months in advance. If you don't receive a letter, please call our office to schedule the follow-up appointment.  If you need a refill on your cardiac medications before your next appointment, please call your pharmacy.

## 2015-12-11 DIAGNOSIS — I452 Bifascicular block: Secondary | ICD-10-CM | POA: Diagnosis not present

## 2015-12-11 DIAGNOSIS — R1084 Generalized abdominal pain: Secondary | ICD-10-CM | POA: Diagnosis not present

## 2015-12-11 DIAGNOSIS — R1032 Left lower quadrant pain: Secondary | ICD-10-CM | POA: Diagnosis not present

## 2015-12-11 DIAGNOSIS — K760 Fatty (change of) liver, not elsewhere classified: Secondary | ICD-10-CM | POA: Diagnosis not present

## 2015-12-11 DIAGNOSIS — D1779 Benign lipomatous neoplasm of other sites: Secondary | ICD-10-CM | POA: Diagnosis not present

## 2015-12-11 DIAGNOSIS — R161 Splenomegaly, not elsewhere classified: Secondary | ICD-10-CM | POA: Diagnosis not present

## 2015-12-11 DIAGNOSIS — R7989 Other specified abnormal findings of blood chemistry: Secondary | ICD-10-CM | POA: Diagnosis not present

## 2015-12-11 DIAGNOSIS — K5730 Diverticulosis of large intestine without perforation or abscess without bleeding: Secondary | ICD-10-CM | POA: Diagnosis not present

## 2015-12-11 DIAGNOSIS — R1031 Right lower quadrant pain: Secondary | ICD-10-CM | POA: Diagnosis not present

## 2015-12-11 NOTE — Telephone Encounter (Signed)
Spoke with patient.

## 2015-12-18 ENCOUNTER — Telehealth: Payer: Self-pay | Admitting: Family Medicine

## 2015-12-18 DIAGNOSIS — I6523 Occlusion and stenosis of bilateral carotid arteries: Secondary | ICD-10-CM

## 2015-12-18 NOTE — Telephone Encounter (Signed)
Spoke with patient regarding following up on his carotid ultrasound (2 yr f/u). Patient doesn't want to schedule at this time. Patient states he is having hernia surgery.

## 2015-12-21 ENCOUNTER — Other Ambulatory Visit: Payer: Self-pay | Admitting: Family Medicine

## 2015-12-21 ENCOUNTER — Encounter: Payer: Self-pay | Admitting: Family Medicine

## 2015-12-21 ENCOUNTER — Ambulatory Visit (INDEPENDENT_AMBULATORY_CARE_PROVIDER_SITE_OTHER): Payer: Medicare Other | Admitting: Family Medicine

## 2015-12-21 ENCOUNTER — Ambulatory Visit
Admission: RE | Admit: 2015-12-21 | Discharge: 2015-12-21 | Disposition: A | Discharge status: Home / Self Care | Payer: Medicare Other | Source: Ambulatory Visit | Attending: Family Medicine | Admitting: Family Medicine

## 2015-12-21 VITALS — BP 150/74 | HR 84 | Temp 98.2°F | Wt 336.0 lb

## 2015-12-21 DIAGNOSIS — R1030 Lower abdominal pain, unspecified: Secondary | ICD-10-CM

## 2015-12-21 DIAGNOSIS — R1011 Right upper quadrant pain: Secondary | ICD-10-CM | POA: Insufficient documentation

## 2015-12-21 DIAGNOSIS — E113599 Type 2 diabetes mellitus with proliferative diabetic retinopathy without macular edema, unspecified eye: Secondary | ICD-10-CM | POA: Diagnosis not present

## 2015-12-21 DIAGNOSIS — R1012 Left upper quadrant pain: Secondary | ICD-10-CM | POA: Diagnosis not present

## 2015-12-21 DIAGNOSIS — I6523 Occlusion and stenosis of bilateral carotid arteries: Secondary | ICD-10-CM | POA: Diagnosis not present

## 2015-12-21 DIAGNOSIS — E1165 Type 2 diabetes mellitus with hyperglycemia: Secondary | ICD-10-CM | POA: Diagnosis not present

## 2015-12-21 DIAGNOSIS — I6529 Occlusion and stenosis of unspecified carotid artery: Secondary | ICD-10-CM | POA: Insufficient documentation

## 2015-12-21 DIAGNOSIS — R109 Unspecified abdominal pain: Secondary | ICD-10-CM | POA: Diagnosis not present

## 2015-12-21 MED ORDER — HYDROCODONE-ACETAMINOPHEN 5-325 MG PO TABS: 1.0000 | ORAL_TABLET | Freq: Three times a day (TID) | ORAL | 0 refills | Status: DC | PRN

## 2015-12-21 NOTE — Telephone Encounter (Signed)
Noted  

## 2015-12-21 NOTE — Patient Instructions (Addendum)
We will order ultrasound of abdominal wall as well as send you to [facility]. Keep f/u appointment in 2 weeks.

## 2015-12-21 NOTE — Assessment & Plan Note (Addendum)
Extensive evaluation, latest at [facility] ER with ?spigellian hernias although recent CT scans have not demonstrated this. All ER notes reviewed.  Will check abd wall [place] and refer to gen surgery for further eval. In interim, pt to avoid any heavy lifting or straining and continue tramadol PRN pain.

## 2015-12-21 NOTE — Progress Notes (Signed)
Pre visit review using our clinic review tool, if applicable. No additional management support is needed unless otherwise documented below in the visit note. 

## 2015-12-21 NOTE — Assessment & Plan Note (Addendum)
Chronic, deteriorated control based on poor adherence to low carb diet. Pt will work towards avoiding sugary foods and reassess at f/u labs next week.
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
## 2015-12-24 ENCOUNTER — Other Ambulatory Visit: Payer: Self-pay | Admitting: Family Medicine

## 2015-12-29 ENCOUNTER — Other Ambulatory Visit: Payer: Self-pay | Admitting: Family Medicine

## 2015-12-29 DIAGNOSIS — E113599 Type 2 diabetes mellitus with proliferative diabetic retinopathy without macular edema, unspecified eye: Secondary | ICD-10-CM

## 2015-12-29 DIAGNOSIS — E7800 Pure hypercholesterolemia, unspecified: Secondary | ICD-10-CM

## 2015-12-29 DIAGNOSIS — R1012 Left upper quadrant pain: Secondary | ICD-10-CM | POA: Diagnosis not present

## 2015-12-29 DIAGNOSIS — R1030 Lower abdominal pain, unspecified: Secondary | ICD-10-CM

## 2015-12-29 DIAGNOSIS — E1165 Type 2 diabetes mellitus with hyperglycemia: Principal | ICD-10-CM

## 2015-12-29 DIAGNOSIS — R1011 Right upper quadrant pain: Secondary | ICD-10-CM | POA: Diagnosis not present

## 2015-12-30 ENCOUNTER — Other Ambulatory Visit (INDEPENDENT_AMBULATORY_CARE_PROVIDER_SITE_OTHER): Payer: Medicare Other

## 2015-12-30 DIAGNOSIS — E7800 Pure hypercholesterolemia, unspecified: Secondary | ICD-10-CM

## 2015-12-30 DIAGNOSIS — E113599 Type 2 diabetes mellitus with proliferative diabetic retinopathy without macular edema, unspecified eye: Secondary | ICD-10-CM | POA: Diagnosis not present

## 2015-12-30 DIAGNOSIS — E1165 Type 2 diabetes mellitus with hyperglycemia: Secondary | ICD-10-CM | POA: Diagnosis not present

## 2015-12-30 DIAGNOSIS — R1030 Lower abdominal pain, unspecified: Secondary | ICD-10-CM | POA: Diagnosis not present

## 2015-12-30 LAB — LIPID PANEL
CHOLESTEROL: 163 mg/dL (ref 0–200)
HDL: 40.8 mg/dL (ref >39.00)
LDL Cholesterol: 90 mg/dL (ref 0–99)
NONHDL: 121.93
Total CHOL/HDL Ratio: 4
Triglycerides: 160 mg/dL — ABNORMAL HIGH (ref 0.0–149.0)
VLDL: 32 mg/dL (ref 0.0–40.0)

## 2015-12-30 LAB — SEDIMENTATION RATE: SED RATE: 7 mm/h (ref 0–20)

## 2015-12-30 LAB — HEMOGLOBIN A1C: Hgb A1c MFr Bld: 7.8 % — ABNORMAL HIGH (ref 4.6–6.5)

## 2016-01-04 ENCOUNTER — Encounter: Payer: Self-pay | Admitting: Family Medicine

## 2016-01-04 ENCOUNTER — Ambulatory Visit (INDEPENDENT_AMBULATORY_CARE_PROVIDER_SITE_OTHER): Payer: Medicare Other | Admitting: Family Medicine

## 2016-01-04 VITALS — BP 140/70 | HR 72 | Wt 339.0 lb

## 2016-01-04 DIAGNOSIS — K3184 Gastroparesis: Secondary | ICD-10-CM

## 2016-01-04 DIAGNOSIS — E7800 Pure hypercholesterolemia, unspecified: Secondary | ICD-10-CM | POA: Diagnosis not present

## 2016-01-04 DIAGNOSIS — E113599 Type 2 diabetes mellitus with proliferative diabetic retinopathy without macular edema, unspecified eye: Secondary | ICD-10-CM

## 2016-01-04 DIAGNOSIS — E1143 Type 2 diabetes mellitus with diabetic autonomic (poly)neuropathy: Secondary | ICD-10-CM | POA: Diagnosis not present

## 2016-01-04 DIAGNOSIS — E1165 Type 2 diabetes mellitus with hyperglycemia: Secondary | ICD-10-CM

## 2016-01-04 DIAGNOSIS — I6523 Occlusion and stenosis of bilateral carotid arteries: Secondary | ICD-10-CM

## 2016-01-04 DIAGNOSIS — K7581 Nonalcoholic steatohepatitis (NASH): Secondary | ICD-10-CM

## 2016-01-04 DIAGNOSIS — R1030 Lower abdominal pain, unspecified: Secondary | ICD-10-CM

## 2016-01-04 DIAGNOSIS — K7460 Unspecified cirrhosis of liver: Secondary | ICD-10-CM

## 2016-01-04 MED ORDER — METFORMIN HCL 1000 MG PO TABS: 1000.0000 mg | ORAL_TABLET | Freq: Two times a day (BID) | ORAL | 1 refills | Status: DC

## 2016-01-04 NOTE — Assessment & Plan Note (Signed)
Ongoing lower lateral abdominal pain with unrevealing extensive evaluations over the last several months. Latest ?spigellian hernias. Has f/u planned with [facility] for second opinion.

## 2016-01-04 NOTE — Assessment & Plan Note (Signed)
Followed by GI.

## 2016-01-04 NOTE — Assessment & Plan Note (Signed)
Chronic, deteriorated. Will increase metformin to 1086m bid, continue amaryl 194mdaily with option to increase if needed.

## 2016-01-04 NOTE — Progress Notes (Signed)
BP 140/70   Pulse 72   Wt (!) 339 lb (153.8 kg)   SpO2 95%   BMI 45.98 kg/m    CC: f/u visit Subjective:    Patient ID: Chad [name], male    DOB: 04/24/1955, 60 y.o.   MRN: [id number]  HPI: Chad [name] is a 60 y.o. male presenting on 01/04/2016 for Follow-up (high blood sugar yesterday was at 266)   See prior note of details. Saw Dr Ralene Ok gen surgery at [facility] for chronic abdominal pain ?spigelian hernias. Rec against any type of surgery. Has scheduled [facility] surgeon appt this week for second opinion. Has been taking hydrocodone for pain. Ongoing pain since the summer with unrevealing evaluations (CT scans, EGD/colonoscopy, [place], ER eval, GI eval, cards eval).   DM - sugars have deteriorated. Fasting today 266 despite taking amaryl 60m daily and metformin 1000/5058m Has not adhered to diabetic diet.   Obesity - nadir 265lbs 01/2014 after marked diet and lifestyle changes. Now abdominal pain limits exercise.   HLD - compliant with RYR 60035mID and lovaxa 1 tab BID.  Relevant past medical, surgical, family and social history reviewed and updated as indicated. Interim medical history since our last visit reviewed. Allergies and medications reviewed and updated. Current Outpatient Prescriptions on File Prior to Visit  Medication Sig  . aspirin 81 MG tablet Take 162 mg by mouth every morning.   . DMariane Baumgartendium (DSS) 100 MG CAPS 1 tab 2 times a day while on narcotics.  STOOL SOFTENER  . Flaxseed, Linseed, (BL FLAX SEED OIL PO) Take 1,200 mg by mouth 2 (two) times daily.  . furosemide (LASIX) 20 MG tablet TAKE 1 TABLET BY MOUTH TWICE A DAY AS NEEDED  . gabapentin (NEURONTIN) 300 MG capsule Take 300 mg by mouth as needed.   . gMarland Kitchenimepiride (AMARYL) 1 MG tablet TAKE 1 TABLET BY MOUTH DAILY WITH BREAKFAST  . HYDROcodone-acetaminophen (NORCO/VICODIN) 5-325 MG tablet Take 1 tablet by mouth 3 (three) times daily as needed for moderate pain.  . lMarland Kitchenvothyroxine  (SYNTHROID, LEVOTHROID) 100 MCG tablet TAKE 1 TABLET BY MOUTH ONCE A DAY  . metoCLOPramide (REGLAN) 10 MG tablet TAKE 1 TABLET BY MOUTH 3 TIMES DAILY BEFORE MEALS  . metoprolol tartrate (LOPRESSOR) 25 MG tablet TAKE 1 TABLET BY MOUTH TWICE A DAY  . Multiple Vitamin (MULITIVITAMIN WITH MINERALS) TABS Take 1 tablet by mouth daily.  . NMarland KitchenXIUM 40 MG capsule TAKE 1 CAPSULE BY MOUTH TWICE DAILY  . nitroGLYCERIN (NITROSTAT) 0.4 MG SL tablet Place 1 tablet (0.4 mg total) under the tongue every 5 (five) minutes as needed for chest pain.  . oMarland [place]-3 acid ethyl esters (LOVAZA) 1 g capsule Take 1 g by mouth 2 (two) times daily.   . potassium chloride (K-DUR) 10 MEQ tablet TAKE 1 TABLET BY MOUTH TWICE A DAY AS NEEDED  . Red Yeast Rice 600 MG CAPS Take 1 capsule (600 mg total) by mouth 2 (two) times daily.  . traMADol (ULTRAM) 50 MG tablet Take 1 tablet (50 mg total) by mouth 2 (two) times daily as needed.   No current facility-administered medications on file prior to visit.     Review of Systems Per HPI unless specifically indicated in ROS section     Objective:    BP 140/70   Pulse 72   Wt (!) 339 lb (153.8 kg)   SpO2 95%   BMI 45.98 kg/m   Wt Readings from Last 3 Encounters:  01/04/16 (!)Marland Kitchen  339 lb (153.8 kg)  12/21/15 (!) 336 lb (152.4 kg)  12/10/15 (!) 340 lb 4 oz (154.3 kg)    Physical Exam  Constitutional: He appears well-developed and well-nourished. No distress.  Cardiovascular: Normal rate, regular rhythm, normal heart sounds and intact distal pulses.   No murmur heard. Pulmonary/Chest: Effort normal and breath sounds normal. No respiratory distress. He has no wheezes. He has no rales.  Abdominal: Soft. Bowel sounds are normal. He exhibits no distension and no mass. There is tenderness. There is no rebound and no guarding.  Diffuse tenderness even to light palpation bilateral abdominal wall No pain at midline incision  Musculoskeletal: He exhibits no edema.  Nursing note and vitals  reviewed.  Results for orders placed or performed in visit on 12/30/15  Sedimentation rate  Result Value Ref Range   Sed Rate 7 0 - 20 mm/hr  Lipid panel  Result Value Ref Range   Cholesterol 163 0 - 200 mg/dL   Triglycerides 160.0 (H) 0.0 - 149.0 mg/dL   HDL 40.80 >39.00 mg/dL   VLDL 32.0 0.0 - 40.0 mg/dL   LDL Cholesterol 90 0 - 99 mg/dL   Total CHOL/HDL Ratio 4    NonHDL 121.93   Hemoglobin A1c  Result Value Ref Range   Hgb A1c MFr Bld 7.8 (H) 4.6 - 6.5 %   Lab Results  Component Value Date   CREATININE 0.91 12/09/2015       Assessment & Plan:   Problem List Items Addressed This Visit    Diabetic gastroparesis (HCC)    Continue reglan 40m TID AC.      Relevant Medications   metFORMIN (GLUCOPHAGE) 1000 MG tablet   HLD (hyperlipidemia)    Statin intolerance. FLP stable on RYR 6022mBID and lovaza 1 gm BID.      Liver cirrhosis secondary to NASH ([place]   Followed by GI.      Lower abdominal pain    Ongoing lower lateral abdominal pain with unrevealing extensive evaluations over the last several months. Latest ?spigellian hernias. Has f/u planned with [facility] for second opinion.       Obesity, Class III, BMI 40-49.9 (morbid obesity) ([place]   Reviewed importance of weight loss to help control chronic diseases.      Relevant Medications   metFORMIN (GLUCOPHAGE) 1000 MG tablet   Type 2 diabetes mellitus, uncontrolled, with retinopathy ([place]- Primary    Chronic, deteriorated. Will increase metformin to 100081mid, continue amaryl 1mg71mily with option to increase if needed.      Relevant Medications   metFORMIN (GLUCOPHAGE) 1000 MG tablet       Follow up plan: Return in about 3 months (around 04/05/2016) for follow up visit.  JaviRia Bush

## 2016-01-04 NOTE — Patient Instructions (Addendum)
Restart metformin 1080m twice daily. Continue glimepiride 144mdaily with breakfast.  Return to see me in 3 months for follow up.  We will see what [facility] doctor says.

## 2016-01-04 NOTE — Assessment & Plan Note (Signed)
Statin intolerance. FLP stable on RYR 668m BID and lovaza 1 gm BID.

## 2016-01-04 NOTE — Assessment & Plan Note (Addendum)
Continue reglan 39m TID AC.

## 2016-01-04 NOTE — Progress Notes (Signed)
Pre visit review using our clinic review tool, if applicable. No additional management support is needed unless otherwise documented below in the visit note. 

## 2016-01-04 NOTE — Assessment & Plan Note (Signed)
Reviewed importance of weight loss to help control chronic diseases.

## 2016-01-05 DIAGNOSIS — I2510 Atherosclerotic heart disease of native coronary artery without angina pectoris: Secondary | ICD-10-CM | POA: Diagnosis not present

## 2016-01-05 DIAGNOSIS — K439 Ventral hernia without obstruction or gangrene: Secondary | ICD-10-CM | POA: Diagnosis not present

## 2016-01-05 DIAGNOSIS — I159 Secondary hypertension, unspecified: Secondary | ICD-10-CM | POA: Diagnosis not present

## 2016-01-05 DIAGNOSIS — Z6841 Body Mass Index (BMI) 40.0 and over, adult: Secondary | ICD-10-CM | POA: Diagnosis not present

## 2016-01-05 DIAGNOSIS — K7460 Unspecified cirrhosis of liver: Secondary | ICD-10-CM | POA: Diagnosis not present

## 2016-01-05 DIAGNOSIS — I440 Atrioventricular block, first degree: Secondary | ICD-10-CM | POA: Diagnosis not present

## 2016-01-05 DIAGNOSIS — R1011 Right upper quadrant pain: Secondary | ICD-10-CM | POA: Diagnosis not present

## 2016-01-05 DIAGNOSIS — R1012 Left upper quadrant pain: Secondary | ICD-10-CM | POA: Diagnosis not present

## 2016-01-05 DIAGNOSIS — K219 Gastro-esophageal reflux disease without esophagitis: Secondary | ICD-10-CM | POA: Diagnosis not present

## 2016-01-05 DIAGNOSIS — G4733 Obstructive sleep apnea (adult) (pediatric): Secondary | ICD-10-CM | POA: Diagnosis not present

## 2016-01-05 DIAGNOSIS — Z9884 Bariatric surgery status: Secondary | ICD-10-CM | POA: Diagnosis not present

## 2016-01-18 ENCOUNTER — Telehealth: Payer: Self-pay | Admitting: Family Medicine

## 2016-01-18 NOTE — Telephone Encounter (Signed)
Will see tomorrow

## 2016-01-18 NOTE — Telephone Encounter (Signed)
[name]  Patient Name: Chad [name]  DOB: 1955/04/26    Initial Comment Caller states husband's bp is very high, 198/86 currently    Nurse Assessment  Nurse: Wynetta Emery, RN, [place]us Date/Time (Eastern Time): 01/18/2016 9:29:51 AM  Confirm and document reason for call. If symptomatic, describe symptoms. You must click the next button to save text entered. ---Chad [name] has been having elevated blood pressure when his stomach pain started in June-- the elevated blood pressure has gotten worse and has floaters in eye and more frequent 198/86 220/86 181/82 no abdominal pain present anymore  Does the patient have any new or worsening symptoms? ---Yes  Will a triage be completed? ---Yes  Related visit to physician within the last 2 weeks? ---No  Does the PT have any chronic conditions? (i.e. diabetes, asthma, etc.) ---Yes  List chronic conditions. ---HTN  Is this a behavioral health or substance abuse call? ---No     Guidelines    Guideline Title Affirmed Question Affirmed Notes  High Blood Pressure [1] BP ? 140/90 AND [2] taking BP medications    Final Disposition User   See Physician within 24 Hours Wynetta Emery, RN, [place]    Comments  NOTE appt scheduled Dr. [name] 11/28-2017 Tuesday At [facility] office at 1115am for elevated blood pressure.   Referrals  REFERRED TO PCP OFFICE   Disagree/Comply: Comply

## 2016-01-18 NOTE — Telephone Encounter (Signed)
Pt has appt with Dr Darnell Level on 01/19/16 at 11:15.

## 2016-01-19 ENCOUNTER — Ambulatory Visit (INDEPENDENT_AMBULATORY_CARE_PROVIDER_SITE_OTHER): Payer: Medicare Other | Admitting: Family Medicine

## 2016-01-19 VITALS — BP 180/92 | HR 64 | Temp 97.6°F | Resp 20 | Wt 339.0 lb

## 2016-01-19 DIAGNOSIS — E1165 Type 2 diabetes mellitus with hyperglycemia: Secondary | ICD-10-CM

## 2016-01-19 DIAGNOSIS — E113599 Type 2 diabetes mellitus with proliferative diabetic retinopathy without macular edema, unspecified eye: Secondary | ICD-10-CM | POA: Diagnosis not present

## 2016-01-19 DIAGNOSIS — R1030 Lower abdominal pain, unspecified: Secondary | ICD-10-CM | POA: Diagnosis not present

## 2016-01-19 DIAGNOSIS — I6523 Occlusion and stenosis of bilateral carotid arteries: Secondary | ICD-10-CM | POA: Diagnosis not present

## 2016-01-19 DIAGNOSIS — I10 Essential (primary) hypertension: Secondary | ICD-10-CM

## 2016-01-19 MED ORDER — GLIMEPIRIDE 2 MG PO TABS: 2.0000 mg | ORAL_TABLET | Freq: Every day | ORAL | 6 refills | Status: DC

## 2016-01-19 MED ORDER — LISINOPRIL 10 MG PO TABS: 10.0000 mg | ORAL_TABLET | Freq: Every day | ORAL | 6 refills | Status: DC

## 2016-01-19 NOTE — Patient Instructions (Addendum)
Increase amaryl (glimepiride) to 85m daily.  Continue lasix and metoprolol  Add on lisinopril 178mdaily. Return in 10 days for kidney check. Return in 1 month for follow up visit.

## 2016-01-19 NOTE — Progress Notes (Signed)
BP (!) 180/92 (BP Location: Right Arm, Patient Position: Sitting, Cuff Size: Large)   Pulse 64   Temp 97.6 F (36.4 C) (Oral)   Resp 20   Wt (!) 339 lb (153.8 kg)   SpO2 97%   BMI 45.98 kg/m    CC: elevated blood pressure readings Subjective:    Patient ID: Chad [name], male    DOB: 09/19/1955, 60 y.o.   MRN: [id number]  HPI: Chad [name] is a 60 y.o. male presenting on 01/19/2016 for Hypertension (causing headaches sometimes)   HTN - Compliant with current antihypertensive regimen of metoprolol 49m bid and lasix 247mMWF. Does check blood pressures at home and brings log: 175-200/80s.  No low blood pressure readings or symptoms of dizziness/syncope.  Denies CP/tightness, SOB, leg swelling. + HA, floaters.   Diabetes - sugar log fasting 140-220s.   [facility]. Abd pain improved with ibuprofen 20054mid.   Relevant past medical, surgical, family and social history reviewed and updated as indicated. Interim medical history since our last visit reviewed. Allergies and medications reviewed and updated. Current Outpatient Prescriptions on File Prior to Visit  Medication Sig  . aspirin 81 MG tablet Take 162 mg by mouth every morning.   . DMariane Baumgartendium (DSS) 100 MG CAPS 1 tab 2 times a day while on narcotics.  STOOL SOFTENER  . Flaxseed, Linseed, (BL FLAX SEED OIL PO) Take 1,200 mg by mouth 2 (two) times daily.  . furosemide (LASIX) 20 MG tablet TAKE 1 TABLET BY MOUTH TWICE A DAY AS NEEDED  . gabapentin (NEURONTIN) 300 MG capsule Take 300 mg by mouth as needed.   . HMarland KitchenDROcodone-acetaminophen (NORCO/VICODIN) 5-325 MG tablet Take 1 tablet by mouth 3 (three) times daily as needed for moderate pain.  . lMarland Kitchenvothyroxine (SYNTHROID, LEVOTHROID) 100 MCG tablet TAKE 1 TABLET BY MOUTH ONCE A DAY  . metFORMIN (GLUCOPHAGE) 1000 MG tablet Take 1 tablet (1,000 mg total) by mouth 2 (two) times daily with a meal.  .  metoCLOPramide (REGLAN) 10 MG tablet TAKE 1 TABLET BY MOUTH 3 TIMES DAILY BEFORE MEALS  . metoprolol tartrate (LOPRESSOR) 25 MG tablet TAKE 1 TABLET BY MOUTH TWICE A DAY  . Multiple Vitamin (MULITIVITAMIN WITH MINERALS) TABS Take 1 tablet by mouth daily.  . NMarland KitchenXIUM 40 MG capsule TAKE 1 CAPSULE BY MOUTH TWICE DAILY  . nitroGLYCERIN (NITROSTAT) 0.4 MG SL tablet Place 1 tablet (0.4 mg total) under the tongue every 5 (five) minutes as needed for chest pain.  . oMarland [place]-3 acid ethyl esters (LOVAZA) 1 g capsule Take 1 g by mouth 2 (two) times daily.   . potassium chloride (K-DUR) 10 MEQ tablet TAKE 1 TABLET BY MOUTH TWICE A DAY AS NEEDED  . Red Yeast Rice 600 MG CAPS Take 1 capsule (600 mg total) by mouth 2 (two) times daily.  . traMADol (ULTRAM) 50 MG tablet Take 1 tablet (50 mg total) by mouth 2 (two) times daily as needed.   No current facility-administered medications on file prior to visit.     Review of Systems Per HPI unless specifically indicated in ROS section     Objective:    BP (!) 180/92 (BP Location: Right Arm, Patient Position: Sitting, Cuff Size: Large)   Pulse 64   Temp 97.6 F (36.4 C) (Oral)   Resp 20   Wt (!) 339 lb (153.8 kg)   SpO2 97%   BMI 45.98 kg/m  Wt Readings from Last 3 Encounters:  01/19/16 (!) 339 lb (153.8 kg)  01/04/16 (!) 339 lb (153.8 kg)  12/21/15 (!) 336 lb (152.4 kg)    Physical Exam  Constitutional: He appears well-developed and well-nourished. No distress.  HENT:  Mouth/Throat: Oropharynx is clear and moist. No oropharyngeal exudate.  Cardiovascular: Normal rate, normal heart sounds and intact distal pulses.   No murmur heard. Pulmonary/Chest: Effort normal and breath sounds normal. No respiratory distress. He has no wheezes. He has no rales.  Musculoskeletal: He exhibits no edema.  Skin: Skin is warm and dry. No rash noted.  Psychiatric: He has a normal mood and affect.  Nursing note and vitals reviewed.  Results for orders placed or  performed in visit on 12/30/15  Sedimentation rate  Result Value Ref Range   Sed Rate 7 0 - 20 mm/hr  Lipid panel  Result Value Ref Range   Cholesterol 163 0 - 200 mg/dL   Triglycerides 160.0 (H) 0.0 - 149.0 mg/dL   HDL 40.80 >39.00 mg/dL   VLDL 32.0 0.0 - 40.0 mg/dL   LDL Cholesterol 90 0 - 99 mg/dL   Total CHOL/HDL Ratio 4    NonHDL 121.93   Hemoglobin A1c  Result Value Ref Range   Hgb A1c MFr Bld 7.8 (H) 4.6 - 6.5 %   Lab Results  Component Value Date   CREATININE 0.91 12/09/2015       Assessment & Plan:   Problem List Items Addressed This Visit    HTN (hypertension) - Primary    Chronic, deteriorated. Continue metoprolol 13m bid and lasix 221mMWF. Add lisinopril 1032maily which patient has previously been on. RTC 10d Cr check, 1 mo f/u visit.       Relevant Medications   lisinopril (PRINIVIL,ZESTRIL) 10 MG tablet   Other Relevant Orders   Basic metabolic panel   Lower abdominal pain    Extensive workup. Latest attributed to ribcage arthritis. Improved on ibuprofen 200m35mD. Suggested decrease to 200mg8mly in am.       Type 2 diabetes mellitus, uncontrolled, with retinopathy (HCC)    Chronic, uncontrolled. Increase amaryl to 2mg d31my. Continue metformin 1000mg b48m      Relevant Medications   glimepiride (AMARYL) 2 MG tablet   lisinopril (PRINIVIL,ZESTRIL) 10 MG tablet       Follow up plan: Return in about 4 weeks (around 02/16/2016) for follow up visit.  Cristiano Capri [name]

## 2016-01-19 NOTE — Assessment & Plan Note (Signed)
Chronic, uncontrolled. Increase amaryl to 18m daily. Continue metformin 10059mbid.

## 2016-01-19 NOTE — Assessment & Plan Note (Signed)
Chronic, deteriorated. Continue metoprolol 91m bid and lasix 238mMWF. Add lisinopril 102maily which patient has previously been on. RTC 10d Cr check, 1 mo f/u visit.

## 2016-01-19 NOTE — Progress Notes (Signed)
Pre visit review using our clinic review tool, if applicable. No additional management support is needed unless otherwise documented below in the visit note. 

## 2016-01-19 NOTE — Assessment & Plan Note (Signed)
Extensive workup. Latest attributed to ribcage arthritis. Improved on ibuprofen 268m BID. Suggested decrease to 201mdaily in am.

## 2016-01-21 ENCOUNTER — Other Ambulatory Visit: Payer: Self-pay | Admitting: Family Medicine

## 2016-01-21 DIAGNOSIS — M1712 Unilateral primary osteoarthritis, left knee: Secondary | ICD-10-CM | POA: Diagnosis not present

## 2016-01-21 NOTE — Telephone Encounter (Signed)
Ok to refill? Last filled 02/16/18 #[id number]

## 2016-02-01 ENCOUNTER — Encounter: Payer: Self-pay | Admitting: *Deleted

## 2016-02-04 ENCOUNTER — Ambulatory Visit: Payer: Medicare Other | Admitting: Family Medicine

## 2016-02-04 ENCOUNTER — Other Ambulatory Visit (INDEPENDENT_AMBULATORY_CARE_PROVIDER_SITE_OTHER): Payer: Medicare Other

## 2016-02-04 DIAGNOSIS — I10 Essential (primary) hypertension: Secondary | ICD-10-CM | POA: Diagnosis not present

## 2016-02-04 LAB — BASIC METABOLIC PANEL
BUN: 19 mg/dL (ref 6–23)
CALCIUM: 9.2 mg/dL (ref 8.4–10.5)
CO2: 26 mEq/L (ref 19–32)
Chloride: 102 mEq/L (ref 96–112)
Creatinine, Ser: 1.02 mg/dL (ref 0.40–1.50)
GFR: 78.93 mL/min (ref >60.00)
GLUCOSE: 98 mg/dL (ref 70–99)
Potassium: 4 mEq/L (ref 3.5–5.1)
SODIUM: 137 meq/L (ref 135–145)

## 2016-02-11 ENCOUNTER — Other Ambulatory Visit: Payer: Self-pay | Admitting: Family Medicine

## 2016-02-17 ENCOUNTER — Encounter: Payer: Self-pay | Admitting: Family Medicine

## 2016-02-17 ENCOUNTER — Ambulatory Visit (INDEPENDENT_AMBULATORY_CARE_PROVIDER_SITE_OTHER): Payer: Medicare Other | Admitting: Family Medicine

## 2016-02-17 VITALS — BP 150/82 | HR 72 | Temp 97.7°F | Wt 335.8 lb

## 2016-02-17 DIAGNOSIS — E1165 Type 2 diabetes mellitus with hyperglycemia: Secondary | ICD-10-CM | POA: Diagnosis not present

## 2016-02-17 DIAGNOSIS — I6523 Occlusion and stenosis of bilateral carotid arteries: Secondary | ICD-10-CM

## 2016-02-17 DIAGNOSIS — I10 Essential (primary) hypertension: Secondary | ICD-10-CM

## 2016-02-17 DIAGNOSIS — E113599 Type 2 diabetes mellitus with proliferative diabetic retinopathy without macular edema, unspecified eye: Secondary | ICD-10-CM | POA: Diagnosis not present

## 2016-02-17 MED ORDER — LISINOPRIL 20 MG PO TABS: 20.0000 mg | ORAL_TABLET | Freq: Every day | ORAL | 6 refills | Status: DC

## 2016-02-17 NOTE — Progress Notes (Signed)
BP (!) 150/82   Pulse 72   Temp 97.7 F (36.5 C) (Oral)   Wt (!) 335 lb 12 oz (152.3 kg)   BMI 45.54 kg/m    CC: f/u visit Subjective:    Patient ID: Chad [name], male    DOB: 1955-05-20, 60 y.o.   MRN: [id number]  HPI: Chad [name] is a 60 y.o. male presenting on 02/17/2016 for Follow-up   HTN - Compliant with current antihypertensive regimen of metoprolol 40m bid, lisinopril 157mdaily, and lasix 2055mwf.  Does check blood pressures at home: recent log reviewed - 150-170/70-80s. No low blood pressure readings or symptoms of dizziness/syncope.  Denies HA, vision changes, CP/tightness, SOB, leg swelling.    DM - regularly does check sugars 90-110s. Compliant with antihyperglycemic regimen which includes: amaryl 2mg62mily and metformin 1000mg10m. Few hypoglycemic symptoms. Denies paresthesias. Last diabetic eye exam DUE.  Pneumovax: 2013.  Prevnar: not due. Lab Results  Component Value Date   HGBA1C 7.8 (H) 12/30/2015   Diabetic Foot Exam - Simple   Simple Foot Form Diabetic Foot exam was performed with the following findings:  Yes 02/17/2016  9:45 AM  Visual Inspection No deformities, no ulcerations, no other skin breakdown bilaterally:  Yes Sensation Testing Intact to touch and monofilament testing bilaterally:  Yes Pulse Check Posterior Tibialis and Dorsalis pulse intact bilaterally:  Yes Comments      Relevant past medical, surgical, family and social history reviewed and updated as indicated. Interim medical history since our last visit reviewed. Allergies and medications reviewed and updated. Current Outpatient Prescriptions on File Prior to Visit  Medication Sig  . aspirin 81 MG tablet Take 162 mg by mouth every morning.   . DocMariane Baumgartenum (DSS) 100 MG CAPS 1 tab 2 times a day while on narcotics.  STOOL SOFTENER  . Flaxseed, Linseed, (BL FLAX SEED OIL PO) Take 1,200 mg by mouth 2 (two) times daily.  . furosemide (LASIX) 20 MG tablet TAKE 1 TABLET BY MOUTH  TWICE A DAY AS NEEDED  . gabapentin (NEURONTIN) 300 MG capsule Take 300 mg by mouth as needed.   . gliMarland Kitchenepiride (AMARYL) 2 MG tablet Take 1 tablet (2 mg total) by mouth daily with breakfast.  . HYDROcodone-acetaminophen (NORCO/VICODIN) 5-325 MG tablet Take 1 tablet by mouth 3 (three) times daily as needed for moderate pain.  . ibuMarland Kitchenrofen (ADVIL,MOTRIN) 200 MG tablet Take 200 mg by mouth daily.  . levMarland Kitchenthyroxine (SYNTHROID, LEVOTHROID) 100 MCG tablet TAKE 1 TABLET BY MOUTH ONCE A DAY  . metFORMIN (GLUCOPHAGE) 1000 MG tablet Take 1 tablet (1,000 mg total) by mouth 2 (two) times daily with a meal.  . metoCLOPramide (REGLAN) 10 MG tablet TAKE 1 TABLET BY MOUTH 3 TIMES DAILY BEFORE MEALS  . metoprolol tartrate (LOPRESSOR) 25 MG tablet TAKE 1 TABLET BY MOUTH TWICE A DAY  . Multiple Vitamin (MULITIVITAMIN WITH MINERALS) TABS Take 1 tablet by mouth daily.  . NEXMarland KitchenUM 40 MG capsule TAKE 1 CAPSULE BY MOUTH TWICE DAILY  . nitroGLYCERIN (NITROSTAT) 0.4 MG SL tablet Place 1 tablet (0.4 mg total) under the tongue every 5 (five) minutes as needed for chest pain.  . omeMarland Kitchena-3 acid ethyl esters (LOVAZA) 1 g capsule TAKE 2 CAPSULE BY MOUTH TWICE A DAY  . potassium chloride (K-DUR) 10 MEQ tablet TAKE 1 TABLET BY MOUTH TWICE A DAY AS NEEDED  . Red Yeast Rice 600 MG CAPS Take 1 capsule (600 mg total) by mouth 2 (two) times daily.  .Marland Kitchen  traMADol (ULTRAM) 50 MG tablet Take 1 tablet (50 mg total) by mouth 2 (two) times daily as needed.   No current facility-administered medications on file prior to visit.     Review of Systems Per HPI unless specifically indicated in ROS section     Objective:    BP (!) 150/82   Pulse 72   Temp 97.7 F (36.5 C) (Oral)   Wt (!) 335 lb 12 oz (152.3 kg)   BMI 45.54 kg/m   Wt Readings from Last 3 Encounters:  02/17/16 (!) 335 lb 12 oz (152.3 kg)  01/19/16 (!) 339 lb (153.8 kg)  01/04/16 (!) 339 lb (153.8 kg)    Physical Exam  Constitutional: He appears well-developed and  well-nourished. No distress.  HENT:  Head: Normocephalic and atraumatic.  Right Ear: External ear normal.  Left Ear: External ear normal.  Nose: Nose normal.  Mouth/Throat: Oropharynx is clear and moist. No oropharyngeal exudate.  Eyes: Conjunctivae and EOM are normal. Pupils are equal, round, and reactive to light. No scleral icterus.  Neck: Normal range of motion. Neck supple.  Cardiovascular: Normal rate, regular rhythm, normal heart sounds and intact distal pulses.   No murmur heard. Pulmonary/Chest: Effort normal and breath sounds normal. No respiratory distress. He has no wheezes. He has no rales.  Musculoskeletal: He exhibits no edema.  See HPI for foot exam if done  Lymphadenopathy:    He has no cervical adenopathy.  Skin: Skin is warm and dry. No rash noted.  Psychiatric: He has a normal mood and affect.  Nursing note and vitals reviewed.  Results for orders placed or performed in visit on 62/13/08  Basic metabolic panel  Result Value Ref Range   Sodium 137 135 - 145 mEq/L   Potassium 4.0 3.5 - 5.1 mEq/L   Chloride 102 96 - 112 mEq/L   CO2 26 19 - 32 mEq/L   Glucose, Bld 98 70 - 99 mg/dL   BUN 19 6 - 23 mg/dL   Creatinine, Ser 1.02 0.40 - 1.50 mg/dL   Calcium 9.2 8.4 - 10.5 mg/dL   GFR 78.93 >60.00 mL/min      Assessment & Plan:   Problem List Items Addressed This Visit    HTN (hypertension)    Chronic, improving. Will increase lisinopril to 32m daily. Recheck 3 months f/u visit.       Relevant Medications   lisinopril (PRINIVIL,ZESTRIL) 20 MG tablet   Type 2 diabetes mellitus, uncontrolled, with retinopathy [place]    Chronic, improving readings. Too early for A1c. RTC 3 mo f/u visit. Encouraged increased activity for goal weight loss.      Relevant Medications   lisinopril (PRINIVIL,ZESTRIL) 20 MG tablet       Follow up plan: Return in about 3 months (around 05/17/2016), or if symptoms worsen or fail to improve, for follow up visit.  JRia Bush MD

## 2016-02-17 NOTE — Patient Instructions (Addendum)
Sugars are looking better! Continue current medicines Schedule eye doctor appointment.  Increase lisinopril to 26m daily - new dose at pharmacy. I'm glad you're doing well. Return in 3 months for follow up visit. Merry Christmas!

## 2016-02-17 NOTE — Progress Notes (Signed)
Pre visit review using our clinic review tool, if applicable. No additional management support is needed unless otherwise documented below in the visit note. 

## 2016-02-17 NOTE — Assessment & Plan Note (Signed)
Chronic, improving. Will increase lisinopril to 57m daily. Recheck 3 months f/u visit.

## 2016-02-17 NOTE — Assessment & Plan Note (Signed)
Chronic, improving readings. Too early for A1c. RTC 3 mo f/u visit. Encouraged increased activity for goal weight loss.

## 2016-02-25 ENCOUNTER — Ambulatory Visit: Payer: Medicare Other | Admitting: Family Medicine

## 2016-02-25 ENCOUNTER — Telehealth: Payer: Self-pay | Admitting: Family Medicine

## 2016-02-25 NOTE — Telephone Encounter (Signed)
This Probation officer contacted patient and spoke with wife, Barnetta Chapel, regarding patient symptoms.  Patient's condition determined stable enough to come in to office today and he will see Tor Netters, NP at 12:00pm.    In meantime, wife encourages to push fluids on him and if symptoms worsen with breathing or weakness progress and he his unable to transfer or ambulate without assistance, then he should go on to ER per [facility]'s recommendations.  Wife verbalizes understanding.

## 2016-02-25 NOTE — Telephone Encounter (Signed)
PLEASE NOTE: All timestamps contained within this report are represented as [place] Standard Time. CONFIDENTIALTY NOTICE: This fax transmission is intended only for the addressee. It contains information that is legally privileged, confidential or otherwise protected from use or disclosure. If you are not the intended recipient, you are strictly prohibited from reviewing, disclosing, copying using or disseminating any of this information or taking any action in reliance on or regarding this information. If you have received this fax in error, please notify us immediately by telephone so that we can arrange for its return to [place]. Phone: [phone], Toll-Free: [phone], Fax: [phone] Page: 1 of 1 Call Id: [id number] [place] Patient Name: Chad [name] DOB: December 08, 1955 Initial Comment Caller states her husband started getting a headache and cough on New Years Eve. Low grade fever. Chest pain from coughing, BP 114/66. Coughing blood. Shortness of breath. Nurse Assessment Nurse: Marcelline Deist, RN, Lynda Date/Time (Eastern Time): 02/25/2016 8:12:25 AM Confirm and document reason for call. If symptomatic, describe symptoms. ---Caller states her husband started getting a headache and cough on New Years Eve. Low grade fever. Chest pain from coughing, BP 114/66. Coughing blood, tastes it in his mouth. Shortness of breath. Is on new BP rx. Has been using OTC rx as well as old cough rx. Does the patient have any new or worsening symptoms? ---Yes Will a triage be completed? ---Yes Related visit to physician within the last 2 weeks? ---No Does the PT have any chronic conditions? (i.e. diabetes, asthma, etc.) ---Yes List chronic conditions. ---on BP rx, diabetes, cholesterol rx Is this a behavioral health or substance abuse call? ---No Guidelines Guideline Title Affirmed Question Affirmed Notes Chest Pain  Difficulty breathing Final Disposition User Go to ED Now Marcelline Deist, RN, Lynda Comments Caller states her husband is c/o soreness of chest, not pressure, or heart pain. It is from all the coughing he is doing. States he is weak, not feeling well at all. Fever > 3 days. Strongly advised to have him seen in the ER. She would rather have him seen in the office. Advised nurse would let office know, they would be calling back. Spoke with Leafy Ro, triage nurse at office and passed along patient's symptoms. Disagree/Comply: Disagree Disagree/Comply Reason: Disagree with instructions

## 2016-03-08 ENCOUNTER — Encounter: Payer: Self-pay | Admitting: Family Medicine

## 2016-03-08 ENCOUNTER — Ambulatory Visit (INDEPENDENT_AMBULATORY_CARE_PROVIDER_SITE_OTHER): Payer: Medicare Other | Admitting: Family Medicine

## 2016-03-08 VITALS — BP 130/78 | HR 62 | Temp 97.7°F | Wt 334.0 lb

## 2016-03-08 DIAGNOSIS — J019 Acute sinusitis, unspecified: Secondary | ICD-10-CM | POA: Diagnosis not present

## 2016-03-08 MED ORDER — DOXYCYCLINE HYCLATE 100 MG PO CAPS: 100.0000 mg | ORAL_CAPSULE | Freq: Two times a day (BID) | ORAL | 0 refills | Status: DC

## 2016-03-08 MED ORDER — HYDROCODONE-HOMATROPINE 5-1.5 MG/5ML PO SYRP: 5.0000 mL | ORAL_SOLUTION | Freq: Three times a day (TID) | ORAL | 0 refills | Status: DC | PRN

## 2016-03-08 NOTE — Patient Instructions (Signed)
You have sinusitis - treat with doxycycline antibiotic. May use hydrocodone cough syrup for cough suppression - caution it may cause sedation. Caution as the cough syrup may have sugar as well.  Let us know if not improving with treatment.

## 2016-03-08 NOTE — Assessment & Plan Note (Signed)
Treat as acute bacterial sinusitis given duration, progression, comorbidities. Lungs clear today. Treat with doxycycline antibiotic course. Will also place on short hydrocodone cough syrup course. Update if not improving with treatment.

## 2016-03-08 NOTE — Progress Notes (Signed)
Pre visit review using our clinic review tool, if applicable. No additional management support is needed unless otherwise documented below in the visit note. 

## 2016-03-08 NOTE — Progress Notes (Signed)
BP 130/78   Pulse 62   Temp 97.7 F (36.5 C) (Oral)   Wt (!) 334 lb (151.5 kg)   SpO2 95%   BMI 45.30 kg/m    CC: cough, congestion headache Subjective:    Patient ID: Chad [name], male    DOB: January 19, 1956, 61 y.o.   MRN: [id number]  HPI: Chad [name] is a 61 y.o. male presenting on 03/08/2016 for Sinusitis (cough,congestion,HA x2weeks)   2 wk h/o cold symptoms with worsening cough. Low grade temperature to 100.1 in the afternoons. Some chills. Coughing up yellow mucous, productive nasal discharge as well. Chest pain from coughing. Head > chest congestion. + dyspnea. Called 911 2 weeks ago with dyspnea and hypoxia, treated with home oxygen.   Not too wheezy.   Treating with tylenol, nyquil. Also taking phenergan cough syrup which helps but causes sedation.  Wife sick, mother recently sick.  Non smoker No h/o asthma/COPD.   Relevant past medical, surgical, family and social history reviewed and updated as indicated. Interim medical history since our last visit reviewed. Allergies and medications reviewed and updated. Current Outpatient Prescriptions on File Prior to Visit  Medication Sig  . aspirin 81 MG tablet Take 162 mg by mouth every morning.   Mariane Baumgarten Sodium (DSS) 100 MG CAPS 1 tab 2 times a day while on narcotics.  STOOL SOFTENER  . Flaxseed, Linseed, (BL FLAX SEED OIL PO) Take 1,200 mg by mouth 2 (two) times daily.  . furosemide (LASIX) 20 MG tablet TAKE 1 TABLET BY MOUTH TWICE A DAY AS NEEDED  . gabapentin (NEURONTIN) 300 MG capsule Take 300 mg by mouth as needed.   Marland Kitchen glimepiride (AMARYL) 2 MG tablet Take 1 tablet (2 mg total) by mouth daily with breakfast.  . HYDROcodone-acetaminophen (NORCO/VICODIN) 5-325 MG tablet Take 1 tablet by mouth 3 (three) times daily as needed for moderate pain.  Marland Kitchen ibuprofen (ADVIL,MOTRIN) 200 MG tablet Take 200 mg by mouth daily.  Marland Kitchen levothyroxine (SYNTHROID, LEVOTHROID) 100 MCG tablet TAKE 1 TABLET BY MOUTH ONCE A DAY  . lisinopril  (PRINIVIL,ZESTRIL) 20 MG tablet Take 1 tablet (20 mg total) by mouth daily.  . metFORMIN (GLUCOPHAGE) 1000 MG tablet Take 1 tablet (1,000 mg total) by mouth 2 (two) times daily with a meal.  . metoCLOPramide (REGLAN) 10 MG tablet TAKE 1 TABLET BY MOUTH 3 TIMES DAILY BEFORE MEALS  . metoprolol tartrate (LOPRESSOR) 25 MG tablet TAKE 1 TABLET BY MOUTH TWICE A DAY  . Multiple Vitamin (MULITIVITAMIN WITH MINERALS) TABS Take 1 tablet by mouth daily.  Marland Kitchen NEXIUM 40 MG capsule TAKE 1 CAPSULE BY MOUTH TWICE DAILY  . nitroGLYCERIN (NITROSTAT) 0.4 MG SL tablet Place 1 tablet (0.4 mg total) under the tongue every 5 (five) minutes as needed for chest pain.  Marland Kitchen omega-3 acid ethyl esters (LOVAZA) 1 g capsule TAKE 2 CAPSULE BY MOUTH TWICE A DAY  . potassium chloride (K-DUR) 10 MEQ tablet TAKE 1 TABLET BY MOUTH TWICE A DAY AS NEEDED  . Red Yeast Rice 600 MG CAPS Take 1 capsule (600 mg total) by mouth 2 (two) times daily.  . traMADol (ULTRAM) 50 MG tablet Take 1 tablet (50 mg total) by mouth 2 (two) times daily as needed.   No current facility-administered medications on file prior to visit.     Review of Systems Per HPI unless specifically indicated in ROS section     Objective:    BP 130/78   Pulse 62   Temp 97.7  F (36.5 C) (Oral)   Wt (!) 334 lb (151.5 kg)   SpO2 95%   BMI 45.30 kg/m   Wt Readings from Last 3 Encounters:  03/08/16 (!) 334 lb (151.5 kg)  02/17/16 (!) 335 lb 12 oz (152.3 kg)  01/19/16 (!) 339 lb (153.8 kg)    Physical Exam  Constitutional: He appears well-developed and well-nourished. No distress.  HENT:  Head: Normocephalic and atraumatic.  Right Ear: External ear and ear canal normal. Decreased hearing is noted.  Left Ear: Hearing, tympanic membrane, external ear and ear canal normal.  Nose: Mucosal edema present. No rhinorrhea. Right sinus exhibits maxillary sinus tenderness and frontal sinus tenderness. Left sinus exhibits maxillary sinus tenderness and frontal sinus  tenderness.  Mouth/Throat: Uvula is midline, oropharynx is clear and moist and mucous membranes are normal. No oropharyngeal exudate, posterior oropharyngeal edema, posterior oropharyngeal erythema or tonsillar abscesses.  Chronic R TM perf with hearing loss  Eyes: Conjunctivae and EOM are normal. Pupils are equal, round, and reactive to light. No scleral icterus.  Neck: Normal range of motion. Neck supple.  Cardiovascular: Normal rate, regular rhythm, normal heart sounds and intact distal pulses.   No murmur heard. Pulmonary/Chest: Effort normal and breath sounds normal. No respiratory distress. He has no wheezes. He has no rales.  Lungs clear  Lymphadenopathy:    He has no cervical adenopathy.  Skin: Skin is warm and dry. No rash noted.  Nursing note and vitals reviewed.  Results for orders placed or performed in visit on 45/36/46  Basic metabolic panel  Result Value Ref Range   Sodium 137 135 - 145 mEq/L   Potassium 4.0 3.5 - 5.1 mEq/L   Chloride 102 96 - 112 mEq/L   CO2 26 19 - 32 mEq/L   Glucose, Bld 98 70 - 99 mg/dL   BUN 19 6 - 23 mg/dL   Creatinine, Ser 1.02 0.40 - 1.50 mg/dL   Calcium 9.2 8.4 - 10.5 mg/dL   GFR 78.93 >60.00 mL/min      Assessment & Plan:   Problem List Items Addressed This Visit    Acute sinusitis - Primary    Treat as acute bacterial sinusitis given duration, progression, comorbidities. Lungs clear today. Treat with doxycycline antibiotic course. Will also place on short hydrocodone cough syrup course. Update if not improving with treatment.       Relevant Medications   doxycycline (VIBRAMYCIN) 100 MG capsule   HYDROcodone-homatropine (HYCODAN) 5-1.5 MG/5ML syrup       Follow up plan: Return if symptoms worsen or fail to improve.  [name], MD

## 2016-03-11 ENCOUNTER — Other Ambulatory Visit: Payer: Self-pay | Admitting: Family Medicine

## 2016-03-11 MED ORDER — METHOCARBAMOL 500 MG PO TABS: 500.0000 mg | ORAL_TABLET | Freq: Three times a day (TID) | ORAL | 0 refills | Status: DC | PRN

## 2016-03-11 NOTE — Telephone Encounter (Signed)
Rx request for diazepam - was on this for muscle spasm.  Recommend we trial robaxin in its place - sent in.

## 2016-03-15 ENCOUNTER — Telehealth: Payer: Self-pay | Admitting: *Deleted

## 2016-03-15 NOTE — Telephone Encounter (Signed)
PA required for methocarbamol. Completed on Cover My meds. Will await determination.

## 2016-03-25 ENCOUNTER — Telehealth: Payer: Self-pay

## 2016-03-25 NOTE — Telephone Encounter (Signed)
Left message for pt to call back  °

## 2016-03-25 NOTE — Telephone Encounter (Signed)
-----   Message from Patti E [place], [place] sent at 11/04/2015  9:07 AM EDT ----- Hello, this patient is due for his Twinrix #3 around  04/08/16, first shot was done 10/07/15 standard.  Please call and set up appointment, thank you.  Dr Hilarie Fredrickson patient.

## 2016-03-29 NOTE — Telephone Encounter (Signed)
Left message for pt to call back  °

## 2016-03-30 ENCOUNTER — Telehealth: Payer: Self-pay | Admitting: Internal Medicine

## 2016-03-30 NOTE — Telephone Encounter (Signed)
Left message for pt to call back. After multiple attempts have been unable to reach pt by phone. Letter mailed to pt requesting that he contact the office to schedule vaccine.

## 2016-03-30 NOTE — Telephone Encounter (Signed)
Pt scheduled for his last twinrix injection on 04/08/16@9 :30am, pt aware of appt.

## 2016-04-04 ENCOUNTER — Encounter: Payer: Self-pay | Admitting: Family Medicine

## 2016-04-04 ENCOUNTER — Ambulatory Visit (INDEPENDENT_AMBULATORY_CARE_PROVIDER_SITE_OTHER): Payer: Medicare Other | Admitting: Family Medicine

## 2016-04-04 VITALS — BP 132/60 | HR 80 | Temp 97.5°F | Wt 338.0 lb

## 2016-04-04 DIAGNOSIS — S99921A Unspecified injury of right foot, initial encounter: Secondary | ICD-10-CM

## 2016-04-04 DIAGNOSIS — E1140 Type 2 diabetes mellitus with diabetic neuropathy, unspecified: Secondary | ICD-10-CM | POA: Insufficient documentation

## 2016-04-04 DIAGNOSIS — I10 Essential (primary) hypertension: Secondary | ICD-10-CM | POA: Diagnosis not present

## 2016-04-04 DIAGNOSIS — G4733 Obstructive sleep apnea (adult) (pediatric): Secondary | ICD-10-CM | POA: Diagnosis not present

## 2016-04-04 DIAGNOSIS — E113599 Type 2 diabetes mellitus with proliferative diabetic retinopathy without macular edema, unspecified eye: Secondary | ICD-10-CM

## 2016-04-04 DIAGNOSIS — E1165 Type 2 diabetes mellitus with hyperglycemia: Secondary | ICD-10-CM | POA: Diagnosis not present

## 2016-04-04 DIAGNOSIS — E1142 Type 2 diabetes mellitus with diabetic polyneuropathy: Secondary | ICD-10-CM

## 2016-04-04 MED ORDER — GLIMEPIRIDE 2 MG PO TABS: 4.0000 mg | ORAL_TABLET | Freq: Every day | ORAL | 6 refills | Status: DC

## 2016-04-04 NOTE — Assessment & Plan Note (Signed)
Granulation tissue present medial 4th R toe with some pinching at toenail. rec further suppotive care, abx ointment, if not healing will refer to podiatry. Pt and wife agree with plan.

## 2016-04-04 NOTE — Assessment & Plan Note (Signed)
Chronic, deteriorated.  Increase glimepiride to 21m daily, continue metformin 10051mbid.  Discussed diet changes to control sugars.  Recommended weight loss.  RTC 3 mo DM f/u visit.

## 2016-04-04 NOTE — Progress Notes (Signed)
BP 132/60   Pulse 80   Temp 97.5 F (36.4 C) (Oral)   Wt (!) 338 lb (153.3 kg)   BMI 45.84 kg/m    CC: f/u visit Subjective:    Patient ID: Chad [name], male    DOB: December 04, 1955, 61 y.o.   MRN: [id number]  HPI: Chad [name] is a 61 y.o. male presenting on 04/04/2016 for Follow-up   Brings log of sugars and blood pressures which was reviewed.   Fasting cbg's - 150-200 over the last 2 weeks.  BP 130-160/70-90s. May not be accurate readings (automatic arm cuff).   HTN - Compliant with current antihypertensive regimen of metoprolol 61m bid, lisinopril 136mdaily, lasix 2057mwf. Does check blood pressures at home: brings log.  No low blood pressure readings or symptoms of dizziness/syncope.  Denies HA, vision changes, CP/tightness, SOB, leg swelling.    DM - regularly does check sugars fasting 150-200s. Compliant with antihyperglycemic regimen which includes: amaryl 2mg6mily and metformin 1000mg11m. Denies low sugars or hypoglycemic symptoms. Progressive L foot paresthesias. Last diabetic eye exam DUE.  Pneumovax: 2013.  Prevnar: not due. He has been eating more sweets because they were on sale.  Lab Results  Component Value Date   HGBA1C 7.8 (H) 12/30/2015   Diabetic Foot Exam - Simple   No data filed       Relevant past medical, surgical, family and social history reviewed and updated as indicated. Interim medical history since our last visit reviewed. Allergies and medications reviewed and updated. Current Outpatient Prescriptions on File Prior to Visit  Medication Sig  . aspirin 81 MG tablet Take 162 mg by mouth every morning.   . DocMariane Baumgartenum (DSS) 100 MG CAPS 1 tab 2 times a day while on narcotics.  STOOL SOFTENER  . Flaxseed, Linseed, (BL FLAX SEED OIL PO) Take 1,200 mg by mouth 2 (two) times daily.  . furosemide (LASIX) 20 MG tablet TAKE 1 TABLET BY MOUTH TWICE A DAY AS NEEDED  . gabapentin (NEURONTIN) 300 MG capsule Take 300 mg by mouth as needed.   .  ibMarland Kitchenprofen (ADVIL,MOTRIN) 200 MG tablet Take 200 mg by mouth daily.  . levMarland Kitchenthyroxine (SYNTHROID, LEVOTHROID) 100 MCG tablet TAKE 1 TABLET BY MOUTH ONCE A DAY  . lisinopril (PRINIVIL,ZESTRIL) 20 MG tablet Take 1 tablet (20 mg total) by mouth daily.  . metFORMIN (GLUCOPHAGE) 1000 MG tablet Take 1 tablet (1,000 mg total) by mouth 2 (two) times daily with a meal.  . metoCLOPramide (REGLAN) 10 MG tablet TAKE 1 TABLET BY MOUTH 3 TIMES DAILY BEFORE MEALS  . metoprolol tartrate (LOPRESSOR) 25 MG tablet TAKE 1 TABLET BY MOUTH TWICE A DAY  . Multiple Vitamin (MULITIVITAMIN WITH MINERALS) TABS Take 1 tablet by mouth daily.  . NEXMarland KitchenUM 40 MG capsule TAKE 1 CAPSULE BY MOUTH TWICE DAILY  . nitroGLYCERIN (NITROSTAT) 0.4 MG SL tablet Place 1 tablet (0.4 mg total) under the tongue every 5 (five) minutes as needed for chest pain.  . omeMarland Kitchena-3 acid ethyl esters (LOVAZA) 1 g capsule TAKE 2 CAPSULE BY MOUTH TWICE A DAY  . potassium chloride (K-DUR) 10 MEQ tablet TAKE 1 TABLET BY MOUTH TWICE A DAY AS NEEDED  . Red Yeast Rice 600 MG CAPS Take 1 capsule (600 mg total) by mouth 2 (two) times daily.  . traMADol (ULTRAM) 50 MG tablet Take 1 tablet (50 mg total) by mouth 2 (two) times daily as needed.  . HYDMarland KitchenOcodone-acetaminophen (NORCO/VICODIN) 5-325 MG tablet Take 1  tablet by mouth 3 (three) times daily as needed for moderate pain. (Patient not taking: Reported on 04/04/2016)   No current facility-administered medications on file prior to visit.     Review of Systems Per HPI unless specifically indicated in ROS section     Objective:    BP 132/60   Pulse 80   Temp 97.5 F (36.4 C) (Oral)   Wt (!) 338 lb (153.3 kg)   BMI 45.84 kg/m   Wt Readings from Last 3 Encounters:  04/04/16 (!) 338 lb (153.3 kg)  03/08/16 (!) 334 lb (151.5 kg)  02/17/16 (!) 335 lb 12 oz (152.3 kg)    Physical Exam  Constitutional: He appears well-developed and well-nourished. No distress.  HENT:  Head: Normocephalic and atraumatic.    Right Ear: External ear normal.  Left Ear: External ear normal.  Nose: Nose normal.  Mouth/Throat: Oropharynx is clear and moist. No oropharyngeal exudate.  Eyes: Conjunctivae and EOM are normal. Pupils are equal, round, and reactive to light. No scleral icterus.  Neck: Normal range of motion. Neck supple.  Cardiovascular: Normal rate, regular rhythm, normal heart sounds and intact distal pulses.   No murmur heard. Pulmonary/Chest: Effort normal and breath sounds normal. No respiratory distress. He has no wheezes. He has no rales.  Musculoskeletal: He exhibits no edema.  See HPI for foot exam if done R 4th medial toe with granulation tissue with nail pushing into tissue, no erythema or drainage   Lymphadenopathy:    He has no cervical adenopathy.  Skin: Skin is warm and dry. No rash noted.  Psychiatric: He has a normal mood and affect.  Nursing note and vitals reviewed.     Assessment & Plan:  Requests form stating he is totally and permanently disabled. I do not have records of disability evaluation - advised either they bring me records of this or return to their disability doctor to get form filled out.  Problem List Items Addressed This Visit    Diabetic neuropathy associated with type 2 diabetes mellitus ([place])    Continue gabapentin 325m.      Relevant Medications   glimepiride (AMARYL) 2 MG tablet   HTN (hypertension)    Chronic. Stable today however high readings at home. No changes today, pt will bring bp cuff to compare at next visit.       Obesity, Class III, BMI 40-49.9 (morbid obesity) ([place]    Discussed healthy diet and lifestyle changes to affect sustainable weight loss. Pt motivated to restart walking regularly. Discussed low impact exercise options in h/o OA.       Relevant Medications   glimepiride (AMARYL) 2 MG tablet   OSA (obstructive sleep apnea)   Toe injury, right, initial encounter    Granulation tissue present medial 4th R toe with some pinching at  toenail. rec further suppotive care, abx ointment, if not healing will refer to podiatry. Pt and wife agree with plan.      Type 2 diabetes mellitus, uncontrolled, with retinopathy ([place]    Chronic, deteriorated.  Increase glimepiride to 468mdaily, continue metformin 100068mid.  Discussed diet changes to control sugars.  Recommended weight loss.  RTC 3 mo DM f/u visit.       Relevant Medications   glimepiride (AMARYL) 2 MG tablet       Follow up plan: Return in about 3 months (around 07/02/2016) for follow up visit.  JavRia BushD

## 2016-04-04 NOTE — Assessment & Plan Note (Signed)
Discussed healthy diet and lifestyle changes to affect sustainable weight loss. Pt motivated to restart walking regularly. Discussed low impact exercise options in h/o OA.

## 2016-04-04 NOTE — Progress Notes (Signed)
Pre visit review using our clinic review tool, if applicable. No additional management support is needed unless otherwise documented below in the visit note. 

## 2016-04-04 NOTE — Assessment & Plan Note (Signed)
Chronic. Stable today however high readings at home. No changes today, pt will bring bp cuff to compare at next visit.

## 2016-04-04 NOTE — Patient Instructions (Addendum)
Decrease added sugars and sweets. Increase walking.  Increase amaryl (glimepiride) to 2 tablets daily in the morning, then return to 1 daily if sugars start dropping too low.  Schedule eye exam at your convenience as you're due.  Either bring me copy of disability evaluation exam showing full disability or return to see disability doctor to fill out form.  Return in 3 months for follow up visit.   For toe - continue antibiotic ointment and let us know if not improving for podiatry referral.

## 2016-04-04 NOTE — Assessment & Plan Note (Signed)
Continue gabapentin 364m.

## 2016-04-08 ENCOUNTER — Ambulatory Visit (INDEPENDENT_AMBULATORY_CARE_PROVIDER_SITE_OTHER): Payer: Medicare Other | Admitting: Internal Medicine

## 2016-04-08 DIAGNOSIS — R935 Abnormal findings on diagnostic imaging of other abdominal regions, including retroperitoneum: Secondary | ICD-10-CM

## 2016-04-08 DIAGNOSIS — R109 Unspecified abdominal pain: Secondary | ICD-10-CM | POA: Diagnosis not present

## 2016-04-08 DIAGNOSIS — K7581 Nonalcoholic steatohepatitis (NASH): Secondary | ICD-10-CM

## 2016-04-08 DIAGNOSIS — Z23 Encounter for immunization: Secondary | ICD-10-CM

## 2016-04-08 DIAGNOSIS — K7460 Unspecified cirrhosis of liver: Secondary | ICD-10-CM

## 2016-04-10 NOTE — Progress Notes (Signed)
Vaccine visit

## 2016-04-20 ENCOUNTER — Encounter: Payer: Self-pay | Admitting: Family Medicine

## 2016-04-20 ENCOUNTER — Ambulatory Visit (INDEPENDENT_AMBULATORY_CARE_PROVIDER_SITE_OTHER): Payer: Medicare Other | Admitting: Family Medicine

## 2016-04-20 VITALS — BP 136/76 | HR 71 | Temp 98.0°F | Wt 343.5 lb

## 2016-04-20 DIAGNOSIS — J019 Acute sinusitis, unspecified: Secondary | ICD-10-CM | POA: Diagnosis not present

## 2016-04-20 MED ORDER — FLUTICASONE PROPIONATE 50 MCG/ACT NA SUSP: 2.0000 | Freq: Every day | NASAL | 1 refills | Status: DC

## 2016-04-20 NOTE — Assessment & Plan Note (Signed)
Anticipate ongoing back pain and dyspnea more related to progressive weight gain noted, discussed this.

## 2016-04-20 NOTE — Progress Notes (Signed)
Pre visit review using our clinic review tool, if applicable. No additional management support is needed unless otherwise documented below in the visit note. 

## 2016-04-20 NOTE — Assessment & Plan Note (Signed)
Anticipate viral sinusitis given short duration. Discussed supportive care. Start flonase as well. Update if not improving with treatment.

## 2016-04-20 NOTE — Patient Instructions (Addendum)
I think you have viral sinus infection. Supportive care for now - fluids, rest, tylenol for headache, if cough worsens may use codeine cough syrup (left over at home).  Start flonase nasal steroid 1 spray into each nostril daily for the next week. This should improve over 7-10 days. Watch for fever >101, worsening productive cough.  Continue albuterol inhaler as needed if short winded.

## 2016-04-20 NOTE — Progress Notes (Signed)
BP 136/76   Pulse 71   Temp 98 F (36.7 C) (Oral)   Wt (!) 343 lb 8 oz (155.8 kg)   SpO2 96%   BMI 46.59 kg/m    CC: "I think that flu's got me again" Subjective:    Patient ID: Chad [name], male    DOB: 05/31/1955, 61 y.o.   MRN: [id number]  HPI: Chad [name] is a 61 y.o. male presenting on 04/20/2016 for Sinusitis (HA,Congestion,hoarse;fatigue)   5d h/o headache, back ache, short winded, hoarseness and fatigue. Tires out easily.   No significant congestion, no worsening coughing. No fevers/chills, ear or tooth pain.   Taking tylenol for this. He also took left over albuterol inhaler which helped.  H/o sinusitis treated with doxy course mid January - did feel better after this.   Wife sick with viral illness and wheezing.  Non smoker No h/o asthma.   Relevant past medical, surgical, family and social history reviewed and updated as indicated. Interim medical history since our last visit reviewed. Allergies and medications reviewed and updated. Outpatient Medications Prior to Visit  Medication Sig Dispense Refill  . aspirin 81 MG tablet Take 162 mg by mouth every morning.     Mariane Baumgarten Sodium (DSS) 100 MG CAPS 1 tab 2 times a day while on narcotics.  STOOL SOFTENER 60 each 0  . Flaxseed, Linseed, (BL FLAX SEED OIL PO) Take 1,200 mg by mouth 2 (two) times daily.    . furosemide (LASIX) 20 MG tablet TAKE 1 TABLET BY MOUTH TWICE A DAY AS NEEDED 180 tablet 3  . gabapentin (NEURONTIN) 300 MG capsule Take 300 mg by mouth as needed.     Marland Kitchen glimepiride (AMARYL) 2 MG tablet Take 2 tablets (4 mg total) by mouth daily with breakfast. 60 tablet 6  . ibuprofen (ADVIL,MOTRIN) 200 MG tablet Take 200 mg by mouth daily.    Marland Kitchen levothyroxine (SYNTHROID, LEVOTHROID) 100 MCG tablet TAKE 1 TABLET BY MOUTH ONCE A DAY 30 tablet 6  . lisinopril (PRINIVIL,ZESTRIL) 20 MG tablet Take 1 tablet (20 mg total) by mouth daily. 30 tablet 6  . metFORMIN (GLUCOPHAGE) 1000 MG tablet Take 1 tablet (1,000 mg  total) by mouth 2 (two) times daily with a meal. 180 tablet 1  . metoCLOPramide (REGLAN) 10 MG tablet TAKE 1 TABLET BY MOUTH 3 TIMES DAILY BEFORE MEALS 90 tablet 3  . metoprolol tartrate (LOPRESSOR) 25 MG tablet TAKE 1 TABLET BY MOUTH TWICE A DAY 60 tablet 6  . Multiple Vitamin (MULITIVITAMIN WITH MINERALS) TABS Take 1 tablet by mouth daily.    Marland Kitchen NEXIUM 40 MG capsule TAKE 1 CAPSULE BY MOUTH TWICE DAILY 60 capsule 6  . nitroGLYCERIN (NITROSTAT) 0.4 MG SL tablet Place 1 tablet (0.4 mg total) under the tongue every 5 (five) minutes as needed for chest pain. 60 tablet 3  . omega-3 acid ethyl esters (LOVAZA) 1 g capsule TAKE 2 CAPSULE BY MOUTH TWICE A DAY 120 capsule 6  . potassium chloride (K-DUR) 10 MEQ tablet TAKE 1 TABLET BY MOUTH TWICE A DAY AS NEEDED 180 tablet 3  . Red Yeast Rice 600 MG CAPS Take 1 capsule (600 mg total) by mouth 2 (two) times daily.    . traMADol (ULTRAM) 50 MG tablet Take 1 tablet (50 mg total) by mouth 2 (two) times daily as needed. 40 tablet 0  . HYDROcodone-acetaminophen (NORCO/VICODIN) 5-325 MG tablet Take 1 tablet by mouth 3 (three) times daily as needed for moderate pain. (  Patient not taking: Reported on 04/04/2016) 30 tablet 0   No facility-administered medications prior to visit.      Per HPI unless specifically indicated in ROS section below Review of Systems     Objective:    BP 136/76   Pulse 71   Temp 98 F (36.7 C) (Oral)   Wt (!) 343 lb 8 oz (155.8 kg)   SpO2 96%   BMI 46.59 kg/m   Wt Readings from Last 3 Encounters:  04/20/16 (!) 343 lb 8 oz (155.8 kg)  04/04/16 (!) 338 lb (153.3 kg)  03/08/16 (!) 334 lb (151.5 kg)    Physical Exam  Constitutional: He appears well-developed and well-nourished. No distress.  HENT:  Head: Normocephalic and atraumatic.  Right Ear: Hearing, tympanic membrane, external ear and ear canal normal.  Left Ear: Hearing, tympanic membrane, external ear and ear canal normal.  Nose: Mucosal edema present. No rhinorrhea.  Right sinus exhibits frontal sinus tenderness. Right sinus exhibits no maxillary sinus tenderness. Left sinus exhibits frontal sinus tenderness. Left sinus exhibits no maxillary sinus tenderness.  Mouth/Throat: Uvula is midline, oropharynx is clear and moist and mucous membranes are normal. No oropharyngeal exudate, posterior oropharyngeal edema, posterior oropharyngeal erythema or tonsillar abscesses.  Nasal mucosal injection/inflammation  Eyes: Conjunctivae and EOM are normal. Pupils are equal, round, and reactive to light. No scleral icterus.  Neck: Normal range of motion. Neck supple.  Cardiovascular: Normal rate, regular rhythm, normal heart sounds and intact distal pulses.   No murmur heard. Pulmonary/Chest: Effort normal and breath sounds normal. No respiratory distress. He has no wheezes. He has no rales.  Lungs clear, no significant cough  Lymphadenopathy:    He has no cervical adenopathy.  Skin: Skin is warm and dry. No rash noted.  Nursing note and vitals reviewed.  Results for orders placed or performed in visit on 02/54/27  Basic metabolic panel  Result Value Ref Range   Sodium 137 135 - 145 mEq/L   Potassium 4.0 3.5 - 5.1 mEq/L   Chloride 102 96 - 112 mEq/L   CO2 26 19 - 32 mEq/L   Glucose, Bld 98 70 - 99 mg/dL   BUN 19 6 - 23 mg/dL   Creatinine, Ser 1.02 0.40 - 1.50 mg/dL   Calcium 9.2 8.4 - 10.5 mg/dL   GFR 78.93 >60.00 mL/min      Assessment & Plan:   Problem List Items Addressed This Visit    Acute sinusitis    Anticipate viral sinusitis given short duration. Discussed supportive care. Start flonase as well. Update if not improving with treatment.       Relevant Medications   fluticasone (FLONASE) 50 MCG/ACT nasal spray   Obesity, Class III, BMI 40-49.9 (morbid obesity) ([facility])    Anticipate ongoing back pain and dyspnea more related to progressive weight gain noted, discussed this.           Follow up plan: No Follow-up on file.  [name], MD

## 2016-05-18 ENCOUNTER — Other Ambulatory Visit: Payer: Self-pay | Admitting: Family Medicine

## 2016-05-31 ENCOUNTER — Telehealth: Payer: Self-pay | Admitting: *Deleted

## 2016-05-31 MED ORDER — INSULIN GLARGINE 100 UNIT/ML SOLOSTAR PEN: 10.0000 [IU] | PEN_INJECTOR | Freq: Every day | SUBCUTANEOUS | 3 refills | Status: DC

## 2016-05-31 MED ORDER — INSULIN PEN NEEDLE 31G X 6 MM MISC: 3 refills | Status: DC

## 2016-05-31 NOTE — Telephone Encounter (Signed)
Pharmacy left message at triage and states pts prescribed insulin is not covered by insurance. The preferred formulary is basaglar quick pen and comes in the same strength and dosing. pls advise

## 2016-05-31 NOTE — Telephone Encounter (Signed)
Fasting sugar is running around 200. He was asking if he should start metformin 1000 2 in AM and 1 in PM like he used to do or continue as is?

## 2016-05-31 NOTE — Telephone Encounter (Signed)
Spoke with wife.  rec start lantus 10u daily. Solostar pen sent to pharmacy.

## 2016-05-31 NOTE — Addendum Note (Signed)
Addended by: [name] on: 05/31/2016 12:06 PM   Modules accepted: Orders

## 2016-06-01 MED ORDER — BASAGLAR KWIKPEN 100 UNIT/ML ~~LOC~~ SOPN: 10.0000 [IU] | PEN_INJECTOR | Freq: Every day | SUBCUTANEOUS | 3 refills | Status: DC

## 2016-06-01 NOTE — Telephone Encounter (Signed)
basaglar sent in.

## 2016-06-02 ENCOUNTER — Telehealth: Payer: Self-pay | Admitting: Family Medicine

## 2016-06-02 DIAGNOSIS — M1712 Unilateral primary osteoarthritis, left knee: Secondary | ICD-10-CM | POA: Diagnosis not present

## 2016-06-02 NOTE — Telephone Encounter (Signed)
Received clearance request for upcoming L total knee replacement - placed in Kim's box. He has appt with Dr [name] next week and f/u with me in May. Would offer sooner preop clearance appt if desired otherwise we may see in May 11th - but would change appt to 30 min appt. thanks.

## 2016-06-03 DIAGNOSIS — M7061 Trochanteric bursitis, right hip: Secondary | ICD-10-CM | POA: Diagnosis not present

## 2016-06-03 NOTE — Telephone Encounter (Signed)
Appt schedule for next week.
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
## 2016-06-06 ENCOUNTER — Telehealth: Payer: Self-pay | Admitting: Cardiovascular Disease

## 2016-06-06 NOTE — Telephone Encounter (Addendum)
Received cardiac clearance request for pt to proceed w/ Left TKR w/ Dr. Noemi Chapel.  DOS has not been scheduled yet pending this clearance. They would like recommendations on holding pt's aspirin 81 mg prior to procedure, as well.  Please route clearance to [place], Attn: Judeen Hammans @ [phone].  Pt has appt 06/07/16 to see Dr. [name].

## 2016-06-07 ENCOUNTER — Ambulatory Visit (INDEPENDENT_AMBULATORY_CARE_PROVIDER_SITE_OTHER): Payer: Medicare Other | Admitting: Cardiovascular Disease

## 2016-06-07 ENCOUNTER — Encounter: Payer: Self-pay | Admitting: Cardiovascular Disease

## 2016-06-07 VITALS — BP 142/66 | HR 70 | Ht 72.0 in | Wt 333.8 lb

## 2016-06-07 DIAGNOSIS — E782 Mixed hyperlipidemia: Secondary | ICD-10-CM

## 2016-06-07 DIAGNOSIS — I2510 Atherosclerotic heart disease of native coronary artery without angina pectoris: Secondary | ICD-10-CM

## 2016-06-07 DIAGNOSIS — I10 Essential (primary) hypertension: Secondary | ICD-10-CM | POA: Diagnosis not present

## 2016-06-07 DIAGNOSIS — I5032 Chronic diastolic (congestive) heart failure: Secondary | ICD-10-CM | POA: Diagnosis not present

## 2016-06-07 NOTE — Telephone Encounter (Signed)
Acceptable risk for surgery Seen today in the office, no further testing needed

## 2016-06-07 NOTE — Patient Instructions (Signed)

## 2016-06-08 ENCOUNTER — Encounter: Payer: Self-pay | Admitting: Family Medicine

## 2016-06-08 ENCOUNTER — Ambulatory Visit (INDEPENDENT_AMBULATORY_CARE_PROVIDER_SITE_OTHER): Payer: Medicare Other | Admitting: Family Medicine

## 2016-06-08 ENCOUNTER — Ambulatory Visit (INDEPENDENT_AMBULATORY_CARE_PROVIDER_SITE_OTHER)
Admission: RE | Admit: 2016-06-08 | Discharge: 2016-06-08 | Disposition: A | Discharge status: Home / Self Care | Payer: Medicare Other | Source: Ambulatory Visit | Attending: Family Medicine | Admitting: Family Medicine

## 2016-06-08 VITALS — BP 140/84 | HR 68 | Temp 97.7°F | Wt 325.0 lb

## 2016-06-08 DIAGNOSIS — M1712 Unilateral primary osteoarthritis, left knee: Secondary | ICD-10-CM | POA: Diagnosis not present

## 2016-06-08 DIAGNOSIS — E1142 Type 2 diabetes mellitus with diabetic polyneuropathy: Secondary | ICD-10-CM | POA: Diagnosis not present

## 2016-06-08 DIAGNOSIS — E113559 Type 2 diabetes mellitus with stable proliferative diabetic retinopathy, unspecified eye: Secondary | ICD-10-CM | POA: Diagnosis not present

## 2016-06-08 DIAGNOSIS — K7581 Nonalcoholic steatohepatitis (NASH): Secondary | ICD-10-CM

## 2016-06-08 DIAGNOSIS — G4733 Obstructive sleep apnea (adult) (pediatric): Secondary | ICD-10-CM

## 2016-06-08 DIAGNOSIS — K3184 Gastroparesis: Secondary | ICD-10-CM

## 2016-06-08 DIAGNOSIS — Z01818 Encounter for other preprocedural examination: Secondary | ICD-10-CM

## 2016-06-08 DIAGNOSIS — I5032 Chronic diastolic (congestive) heart failure: Secondary | ICD-10-CM | POA: Diagnosis not present

## 2016-06-08 DIAGNOSIS — E039 Hypothyroidism, unspecified: Secondary | ICD-10-CM

## 2016-06-08 DIAGNOSIS — E1165 Type 2 diabetes mellitus with hyperglycemia: Secondary | ICD-10-CM

## 2016-06-08 DIAGNOSIS — I517 Cardiomegaly: Secondary | ICD-10-CM | POA: Diagnosis not present

## 2016-06-08 DIAGNOSIS — I10 Essential (primary) hypertension: Secondary | ICD-10-CM | POA: Diagnosis not present

## 2016-06-08 DIAGNOSIS — Z794 Long term (current) use of insulin: Secondary | ICD-10-CM | POA: Diagnosis not present

## 2016-06-08 DIAGNOSIS — I2510 Atherosclerotic heart disease of native coronary artery without angina pectoris: Secondary | ICD-10-CM

## 2016-06-08 DIAGNOSIS — E1143 Type 2 diabetes mellitus with diabetic autonomic (poly)neuropathy: Secondary | ICD-10-CM | POA: Diagnosis not present

## 2016-06-08 DIAGNOSIS — K7460 Unspecified cirrhosis of liver: Secondary | ICD-10-CM | POA: Diagnosis not present

## 2016-06-08 LAB — CBC WITH DIFFERENTIAL/PLATELET
BASOS PCT: 0.3 % (ref 0.0–3.0)
Basophils Absolute: 0 10*3/uL (ref 0.0–0.1)
EOS PCT: 0.5 % (ref 0.0–5.0)
Eosinophils Absolute: 0 10*3/uL (ref 0.0–0.7)
HCT: 46 % (ref 39.0–52.0)
Hemoglobin: 15.1 g/dL (ref 13.0–17.0)
LYMPHS ABS: 1.5 10*3/uL (ref 0.7–4.0)
Lymphocytes Relative: 15.1 % (ref 12.0–46.0)
MCHC: 32.8 g/dL (ref 30.0–36.0)
MCV: 82.6 fl (ref 78.0–100.0)
Monocytes Absolute: 0.9 10*3/uL (ref 0.1–1.0)
Monocytes Relative: 9 % (ref 3.0–12.0)
NEUTROS ABS: 7.7 10*3/uL (ref 1.4–7.7)
NEUTROS PCT: 75.1 % (ref 43.0–77.0)
PLATELETS: 221 10*3/uL (ref 150.0–400.0)
RBC: 5.57 Mil/uL (ref 4.22–5.81)
RDW: 14.1 % (ref 11.5–15.5)
WBC: 10.2 10*3/uL (ref 4.0–10.5)

## 2016-06-08 LAB — HEMOGLOBIN A1C: Hgb A1c MFr Bld: 7.4 % — ABNORMAL HIGH (ref 4.6–6.5)

## 2016-06-08 LAB — BASIC METABOLIC PANEL
BUN: 18 mg/dL (ref 6–23)
CALCIUM: 9.4 mg/dL (ref 8.4–10.5)
CO2: 25 mEq/L (ref 19–32)
Chloride: 102 mEq/L (ref 96–112)
Creatinine, Ser: 0.95 mg/dL (ref 0.40–1.50)
GFR: 85.58 mL/min (ref >60.00)
Glucose, Bld: 97 mg/dL (ref 70–99)
Potassium: 4.2 mEq/L (ref 3.5–5.1)
SODIUM: 135 meq/L (ref 135–145)

## 2016-06-08 LAB — PROTIME-INR
INR: 1.1 ratio — AB (ref 0.8–1.0)
PROTHROMBIN TIME: 11.2 s (ref 9.6–13.1)

## 2016-06-08 LAB — TSH: TSH: 7.25 u[IU]/mL — ABNORMAL HIGH (ref 0.35–4.50)

## 2016-06-08 NOTE — Progress Notes (Signed)
Pre visit review using our clinic review tool, if applicable. No additional management support is needed unless otherwise documented below in the visit note. 

## 2016-06-08 NOTE — Assessment & Plan Note (Signed)
Chronic, stable on current levothyroxine dose. Update TSH today.

## 2016-06-08 NOTE — Assessment & Plan Note (Signed)
h/o this, unable to tolerate CPAP. Endorses restorative sleeping.

## 2016-06-08 NOTE — Assessment & Plan Note (Signed)
Weight loss noted - through healthy diet changes. Motivated to increase activity after knee surgeyr.

## 2016-06-08 NOTE — Assessment & Plan Note (Signed)
Chronic, stable. Continue current regimen. 

## 2016-06-08 NOTE — Progress Notes (Addendum)
BP 140/84   Pulse 68   Temp 97.7 F (36.5 C) (Oral)   Wt (!) 325 lb (147.4 kg)   BMI 44.08 kg/m    CC: preop clearance  Subjective:    Patient ID: Chad [name], male    DOB: [birth year], 61 y.o.   MRN: [id number]  HPI: Chad [name] is a 61 y.o. male presenting on 06/08/2016 for Pre-op Exam   Recently saw cards for cardiac clearance, note reviewed.  Upcoming L total knee replacement by Dr Noemi Chapel pending date of surgery.   DM - we recently restarted lantus 10u nightly due to persistently elevated readings. Yesterday he increased lantus to 15 units nightly with improvement in sugar levels. Checks cbg's BID - AM 150-200s, PM 200s. Possible low sugar yesterday.   He has undergone several surgeries with general anesthesia.  OSA - unable to tolerate CPAP.  Flonase has helped with allergies.  Did not tolerate fish oil - caused skin to break out.  Takes reglan 54m TID - no extrapyramidal sxs endorsed.   Relevant past medical, surgical, family and social history reviewed and updated as indicated. Interim medical history since our last visit reviewed. Allergies and medications reviewed and updated. Outpatient Medications Prior to Visit  Medication Sig Dispense Refill  . albuterol (VENTOLIN HFA) 108 (90 Base) MCG/ACT inhaler Inhale 1 puff into the lungs every 6 (six) hours as needed for wheezing or shortness of breath.    .Marland Kitchenaspirin 81 MG tablet Take 162 mg by mouth every morning.     .Mariane BaumgartenSodium (DSS) 100 MG CAPS 1 tab 2 times a day while on narcotics.  STOOL SOFTENER 60 each 0  . fluticasone (FLONASE) 50 MCG/ACT nasal spray PLACE 2 SPRAYS INTO BOTH NOSTRILS DAILY 16 g 1  . glimepiride (AMARYL) 2 MG tablet Take 2 tablets (4 mg total) by mouth daily with breakfast. 60 tablet 6  . HYDROcodone-acetaminophen (NORCO/VICODIN) 5-325 MG tablet Take 1 tablet by mouth 3 (three) times daily as needed for moderate pain. 30 tablet 0  . ibuprofen (ADVIL,MOTRIN) 200 MG tablet Take 200 mg by mouth  daily.    . Insulin Pen Needle 31G X 6 MM MISC Use as directed for lantus solostar pen 100 each 3  . levothyroxine (SYNTHROID, LEVOTHROID) 100 MCG tablet TAKE 1 TABLET BY MOUTH ONCE A DAY 30 tablet 6  . lisinopril (PRINIVIL,ZESTRIL) 20 MG tablet Take 1 tablet (20 mg total) by mouth daily. 30 tablet 6  . metFORMIN (GLUCOPHAGE) 1000 MG tablet Take 1 tablet (1,000 mg total) by mouth 2 (two) times daily with a meal. 180 tablet 1  . metoCLOPramide (REGLAN) 10 MG tablet TAKE 1 TABLET BY MOUTH 3 TIMES DAILY BEFORE MEALS 90 tablet 3  . metoprolol tartrate (LOPRESSOR) 25 MG tablet TAKE 1 TABLET BY MOUTH TWICE A DAY 60 tablet 6  . Multiple Vitamin (MULITIVITAMIN WITH MINERALS) TABS Take 1 tablet by mouth daily.    .Marland KitchenNEXIUM 40 MG capsule TAKE 1 CAPSULE BY MOUTH TWICE DAILY 60 capsule 6  . nitroGLYCERIN (NITROSTAT) 0.4 MG SL tablet Place 1 tablet (0.4 mg total) under the tongue every 5 (five) minutes as needed for chest pain. 60 tablet 3  . Red Yeast Rice 600 MG CAPS Take 1 capsule (600 mg total) by mouth 2 (two) times daily.    . traMADol (ULTRAM) 50 MG tablet Take 1 tablet (50 mg total) by mouth 2 (two) times daily as needed. 40 tablet 0  . furosemide (LASIX)  20 MG tablet TAKE 1 TABLET BY MOUTH TWICE A DAY AS NEEDED 180 tablet 3  . gabapentin (NEURONTIN) 300 MG capsule Take 300 mg by mouth as needed.     . Insulin Glargine (BASAGLAR KWIKPEN) 100 UNIT/ML SOPN Inject 0.1 mLs (10 Units total) into the skin at bedtime. 5 pen 3  . potassium chloride (K-DUR) 10 MEQ tablet TAKE 1 TABLET BY MOUTH TWICE A DAY AS NEEDED 180 tablet 3  . Flaxseed, Linseed, (BL FLAX SEED OIL PO) Take 1,200 mg by mouth 2 (two) times daily.    Marland Kitchen omega-3 acid ethyl esters (LOVAZA) 1 g capsule TAKE 2 CAPSULE BY MOUTH TWICE A DAY (Patient not taking: Reported on 06/08/2016) 120 capsule 6   No facility-administered medications prior to visit.      Per HPI unless specifically indicated in ROS section below Review of Systems       Objective:    BP 140/84   Pulse 68   Temp 97.7 F (36.5 C) (Oral)   Wt (!) 325 lb (147.4 kg)   BMI 44.08 kg/m   Wt Readings from Last 3 Encounters:  06/08/16 (!) 325 lb (147.4 kg)  06/07/16 (!) 333 lb 12 oz (151.4 kg)  04/20/16 (!) 343 lb 8 oz (155.8 kg)    Physical Exam  Constitutional: He appears well-developed and well-nourished. No distress.  HENT:  Head: Normocephalic and atraumatic.  Mouth/Throat: Oropharynx is clear and moist. No oropharyngeal exudate.  Cardiovascular: Normal rate, regular rhythm, normal heart sounds and intact distal pulses.   No murmur heard. Pulmonary/Chest: Effort normal and breath sounds normal. No respiratory distress. He has no wheezes. He has no rales.  Musculoskeletal: He exhibits no edema.  Skin: Skin is warm and dry. No rash noted.  Psychiatric: He has a normal mood and affect.  Nursing note and vitals reviewed.  Results for orders placed or performed in visit on 95/62/13  Basic metabolic panel  Result Value Ref Range   Sodium 137 135 - 145 mEq/L   Potassium 4.0 3.5 - 5.1 mEq/L   Chloride 102 96 - 112 mEq/L   CO2 26 19 - 32 mEq/L   Glucose, Bld 98 70 - 99 mg/dL   BUN 19 6 - 23 mg/dL   Creatinine, Ser 1.02 0.40 - 1.50 mg/dL   Calcium 9.2 8.4 - 10.5 mg/dL   GFR 78.93 >60.00 mL/min      Assessment & Plan:   Problem List Items Addressed This Visit    Diabetic gastroparesis ([place])    Continues reglan without side effects.      Relevant Medications   Insulin Glargine (BASAGLAR KWIKPEN) 100 UNIT/ML SOPN   Diabetic neuropathy associated with type 2 diabetes mellitus (HCC)    Now off gabapentin      Relevant Medications   Insulin Glargine (BASAGLAR KWIKPEN) 100 UNIT/ML SOPN   Other Relevant Orders   Basic metabolic panel   Hemoglobin Y8M   Diastolic CHF, chronic (HCC)    Chronic, stable. Continues lasix PRN (about three times a week) as well as beta blocker.       Relevant Medications   furosemide (LASIX) 20 MG tablet   HTN  (hypertension)    Chronic, stable. Continue current regimen.       Relevant Medications   furosemide (LASIX) 20 MG tablet   Hypothyroid    Chronic, stable on current levothyroxine dose. Update TSH today.       Relevant Orders   TSH   Liver cirrhosis secondary  to NASH [facility])    h/o this, mild, stable period. Sees GI.       Relevant Orders   Protime-INR   Obesity, Class III, BMI 40-49.9 (morbid obesity) (HCC)    Weight loss noted - through healthy diet changes. Motivated to increase activity after knee surgeyr.       Relevant Medications   Insulin Glargine (BASAGLAR KWIKPEN) 100 UNIT/ML SOPN   OSA (obstructive sleep apnea)    h/o this, unable to tolerate CPAP. Endorses restorative sleeping.       Preoperative clearance - Primary    RCRI = 2. Has already had cardiac clearance by Dr [name]. No further intervention warranted.  Will check CXR and labs today.  Then will likely clear for upcoming orthopedic surgery.       Relevant Orders   CBC with Differential/Platelet   Basic metabolic panel   Protime-INR   DG Chest 2 View   Primary osteoarthritis of left knee    Pending knee replacement by ortho for severe activity limiting pain.       Type 2 diabetes mellitus, uncontrolled, with retinopathy (HCC)    Chronic, slowly improving based on today's cbg log. He self-increased lantus to 15u with subsequent hypoglycemia. Advised decrease lantus to 13 u nightly and reviewed slowed titration if needed. Check A1c today, return 3 mo f/u visit.       Relevant Medications   Insulin Glargine (BASAGLAR KWIKPEN) 100 UNIT/ML SOPN       Follow up plan: Return in about 3 months (around 09/07/2016) for follow up visit.  [name], MD

## 2016-06-08 NOTE — Telephone Encounter (Signed)
Chad [name] would like recommendations on holding pt's  Aspirin (he takes 2 65m tablets QD)  prior to procedure.  Routed to Dr. GRockey Situ

## 2016-06-08 NOTE — Assessment & Plan Note (Signed)
h/o this, mild, stable period. Sees GI.

## 2016-06-08 NOTE — Patient Instructions (Addendum)
Decrease lantus to 13 units nightly. If sugars staying consistently >150, may slowly increase back to 15 units nightly.  Xray today Labs today.  Cancel appointment next month, reschedule for 3 months from now

## 2016-06-08 NOTE — Assessment & Plan Note (Signed)
Now off gabapentin

## 2016-06-08 NOTE — Assessment & Plan Note (Signed)
Chronic, stable. Continues lasix PRN (about three times a week) as well as beta blocker.

## 2016-06-08 NOTE — Assessment & Plan Note (Signed)
Continues reglan without side effects.

## 2016-06-08 NOTE — Assessment & Plan Note (Signed)
Pending knee replacement by ortho for severe activity limiting pain.

## 2016-06-08 NOTE — Assessment & Plan Note (Signed)
RCRI = 2. Has already had cardiac clearance by Dr [name]. No further intervention warranted.  Will check CXR and labs today.  Then will likely clear for upcoming orthopedic surgery.

## 2016-06-08 NOTE — Telephone Encounter (Signed)
He can take 1 baby aspirin, does not need to Would continue this through the surgery

## 2016-06-08 NOTE — Assessment & Plan Note (Signed)
Chronic, slowly improving based on today's cbg log. He self-increased lantus to 15u with subsequent hypoglycemia. Advised decrease lantus to 13 u nightly and reviewed slowed titration if needed. Check A1c today, return 3 mo f/u visit.

## 2016-06-09 NOTE — Telephone Encounter (Signed)
Routed to fax # provided. 

## 2016-06-14 ENCOUNTER — Other Ambulatory Visit: Payer: Self-pay | Admitting: Family Medicine

## 2016-06-14 MED ORDER — LEVOTHYROXINE SODIUM 112 MCG PO TABS: 112.0000 ug | ORAL_TABLET | Freq: Every day | ORAL | 6 refills | Status: DC

## 2016-06-22 ENCOUNTER — Other Ambulatory Visit: Payer: Self-pay | Admitting: Family Medicine

## 2016-06-29 ENCOUNTER — Encounter (HOSPITAL_COMMUNITY): Payer: Self-pay | Admitting: Physician Assistant

## 2016-06-29 DIAGNOSIS — M1712 Unilateral primary osteoarthritis, left knee: Secondary | ICD-10-CM

## 2016-06-29 HISTORY — DX: Unilateral primary osteoarthritis, left knee: M17.12

## 2016-06-29 NOTE — Pre-Procedure Instructions (Signed)
JAEKWON MCCLUNE  06/29/2016      [facility] - [place], [place] - [address] [place] [address] Phone: [phone] Fax: [phone]    Your procedure is scheduled on Monday, May 21st   Report to [facility] Admitting at 8:00 Am               (posted surgery time 10:02 - 12:19 pm)   Call this number if you have problems the morning of surgery:  [phone], other questions call [phone] Mon - Fri from 8-4:30pm   Remember:  Do not eat food or drink liquids after midnight Sunday.   Take these medicines the morning of surgery with A SIP OF WATER : Levothyroxine, Nexium, Metoprolol. And please use your inhaler that morning.              4-5 days prior to surgery, STOP TAKING any Vitamins, Herbal Supplements, Blood thinners, Anti-inflammatories.   Do not wear jewelry - no rings or watches.  Do not wear lotions, colognes or deoderant.              Men may shave face and neck.  Do not bring valuables to the hospital.  [facility] is not responsible for any belongings or valuables.  Contacts, dentures or bridgework may not be worn into surgery.  Leave your suitcase in the car.  After surgery it may be brought to your room. For patients admitted to the hospital, discharge time will be determined by your treatment team.   Please read over the following fact sheets that you were given. Pain Booklet, MRSA Information and Surgical Site Infection Prevention            How to Manage Your Diabetes Before and After Surgery  Why is it important to control my blood sugar before and after surgery? . Improving blood sugar levels before and after surgery helps healing and can limit problems. . A way of improving blood sugar control is eating a healthy diet by: o  Eating less sugar and carbohydrates o  Increasing activity/exercise o  Talking with your doctor about reaching your blood sugar goals . High blood sugars (greater  than 180 mg/dL) can raise your risk of infections and slow your recovery, so you will need to focus on controlling your diabetes during the weeks before surgery. . Make sure that the doctor who takes care of your diabetes knows about your planned surgery including the date and location.  How do I manage my blood sugar before surgery? . Check your blood sugar at least 4 times a day, starting 2 days before surgery, to make sure that the level is not too high or low. Marland Kitchen  o Check your blood sugar the morning of your surgery when you wake up and every 2 hours until you get to the Short Stay unit. o  . If your blood sugar is less than 70 mg/dL, you will need to treat for low blood sugar: o Do not take insulin. o Treat a low blood sugar (less than 70 mg/dL) with  cup of clear juice (cranberry or apple), 4 glucose tablets, OR glucose gel. o  o Recheck blood sugar in 15 minutes after treatment (to make sure it is greater than 70 mg/dL). If your blood sugar is not greater than 70 mg/dL on recheck, call [phone] for further instructions. o  . Report your blood sugar to the short stay nurse when you get to Short  Stay.  . If you are admitted to the hospital after surgery: o Your blood sugar will be checked by the staff and you will probably be given insulin after surgery (instead of oral diabetes medicines) to make sure you have good blood sugar levels. o The goal for blood sugar control after surgery is 80-180 mg/dL.              WHAT DO I DO ABOUT MY DIABETES MEDICATION?   Marland Kitchen Do not take oral diabetes medicines (pills) the morning of surgery.  . THE NIGHT BEFORE SURGERY, take  6.5  units of  Glargine (Basaglar) insulin.    . The day of surgery, do not take other diabetes injectables, including Byetta (exenatide), Bydureon (exenatide ER), Victoza (liraglutide), or Trulicity (dulaglutide).  . If your CBG is greater than 220 mg/dL, you may take  of your sliding scale (correction) dose of  insulin.  Other Instructions:          Patient Signature:  Date:   Nurse Signature:  Date:   Reviewed and Endorsed by [facility] Patient Education Committee, August 2015
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
## 2016-06-30 ENCOUNTER — Encounter (HOSPITAL_COMMUNITY)
Admission: RE | Admit: 2016-06-30 | Discharge: 2016-06-30 | Disposition: A | Discharge status: Home / Self Care | Payer: Medicare Other | Source: Ambulatory Visit | Attending: Orthopedic Surgery | Admitting: Orthopedic Surgery

## 2016-06-30 ENCOUNTER — Encounter (HOSPITAL_COMMUNITY): Payer: Self-pay

## 2016-06-30 DIAGNOSIS — Z9884 Bariatric surgery status: Secondary | ICD-10-CM | POA: Diagnosis not present

## 2016-06-30 DIAGNOSIS — E119 Type 2 diabetes mellitus without complications: Secondary | ICD-10-CM | POA: Insufficient documentation

## 2016-06-30 DIAGNOSIS — Z01812 Encounter for preprocedural laboratory examination: Secondary | ICD-10-CM | POA: Diagnosis not present

## 2016-06-30 DIAGNOSIS — Z0183 Encounter for blood typing: Secondary | ICD-10-CM | POA: Insufficient documentation

## 2016-06-30 DIAGNOSIS — G4733 Obstructive sleep apnea (adult) (pediatric): Secondary | ICD-10-CM | POA: Insufficient documentation

## 2016-06-30 DIAGNOSIS — I5032 Chronic diastolic (congestive) heart failure: Secondary | ICD-10-CM | POA: Diagnosis not present

## 2016-06-30 DIAGNOSIS — R161 Splenomegaly, not elsewhere classified: Secondary | ICD-10-CM | POA: Insufficient documentation

## 2016-06-30 DIAGNOSIS — E039 Hypothyroidism, unspecified: Secondary | ICD-10-CM | POA: Insufficient documentation

## 2016-06-30 DIAGNOSIS — Z6841 Body Mass Index (BMI) 40.0 and over, adult: Secondary | ICD-10-CM | POA: Diagnosis not present

## 2016-06-30 DIAGNOSIS — Z794 Long term (current) use of insulin: Secondary | ICD-10-CM | POA: Insufficient documentation

## 2016-06-30 DIAGNOSIS — Z96651 Presence of right artificial knee joint: Secondary | ICD-10-CM | POA: Insufficient documentation

## 2016-06-30 DIAGNOSIS — Z01818 Encounter for other preprocedural examination: Secondary | ICD-10-CM | POA: Diagnosis not present

## 2016-06-30 DIAGNOSIS — I110 Hypertensive heart disease with heart failure: Secondary | ICD-10-CM | POA: Diagnosis not present

## 2016-06-30 DIAGNOSIS — M1712 Unilateral primary osteoarthritis, left knee: Secondary | ICD-10-CM | POA: Diagnosis not present

## 2016-06-30 DIAGNOSIS — K7581 Nonalcoholic steatohepatitis (NASH): Secondary | ICD-10-CM | POA: Diagnosis not present

## 2016-06-30 DIAGNOSIS — Z79899 Other long term (current) drug therapy: Secondary | ICD-10-CM | POA: Insufficient documentation

## 2016-06-30 DIAGNOSIS — K219 Gastro-esophageal reflux disease without esophagitis: Secondary | ICD-10-CM | POA: Insufficient documentation

## 2016-06-30 DIAGNOSIS — Z7982 Long term (current) use of aspirin: Secondary | ICD-10-CM | POA: Diagnosis not present

## 2016-06-30 LAB — CBC WITH DIFFERENTIAL/PLATELET
Basophils Absolute: 0 10*3/uL (ref 0.0–0.1)
Basophils Relative: 0 %
EOS ABS: 0.1 10*3/uL (ref 0.0–0.7)
EOS PCT: 2 %
HCT: 41.3 % (ref 39.0–52.0)
HEMOGLOBIN: 13.4 g/dL (ref 13.0–17.0)
LYMPHS ABS: 0.9 10*3/uL (ref 0.7–4.0)
LYMPHS PCT: 15 %
MCH: 27 pg (ref 26.0–34.0)
MCHC: 32.4 g/dL (ref 30.0–36.0)
MCV: 83.1 fL (ref 78.0–100.0)
MONOS PCT: 8 %
Monocytes Absolute: 0.5 10*3/uL (ref 0.1–1.0)
Neutro Abs: 4.3 10*3/uL (ref 1.7–7.7)
Neutrophils Relative %: 75 %
Platelets: 131 10*3/uL — ABNORMAL LOW (ref 150–400)
RBC: 4.97 MIL/uL (ref 4.22–5.81)
RDW: 14.3 % (ref 11.5–15.5)
WBC: 5.8 10*3/uL (ref 4.0–10.5)

## 2016-06-30 LAB — PROTIME-INR
INR: 1.05
Prothrombin Time: 13.7 seconds (ref 11.4–15.2)

## 2016-06-30 LAB — COMPREHENSIVE METABOLIC PANEL
ALK PHOS: 104 U/L (ref 38–126)
ALT: 41 U/L (ref 17–63)
ANION GAP: 9 (ref 5–15)
AST: 40 U/L (ref 15–41)
Albumin: 4 g/dL (ref 3.5–5.0)
BILIRUBIN TOTAL: 0.6 mg/dL (ref 0.3–1.2)
BUN: 12 mg/dL (ref 6–20)
CALCIUM: 9.3 mg/dL (ref 8.9–10.3)
CO2: 25 mmol/L (ref 22–32)
CREATININE: 0.92 mg/dL (ref 0.61–1.24)
Chloride: 104 mmol/L (ref 101–111)
Glucose, Bld: 84 mg/dL (ref 65–99)
Potassium: 4 mmol/L (ref 3.5–5.1)
Sodium: 138 mmol/L (ref 135–145)
TOTAL PROTEIN: 6.2 g/dL — AB (ref 6.5–8.1)

## 2016-06-30 LAB — TYPE AND SCREEN
ABO/RH(D): O POS
Antibody Screen: NEGATIVE

## 2016-06-30 LAB — GLUCOSE, CAPILLARY: GLUCOSE-CAPILLARY: 159 mg/dL — AB (ref 65–99)

## 2016-06-30 LAB — APTT: aPTT: 29 seconds (ref 24–36)

## 2016-06-30 LAB — SURGICAL PCR SCREEN
MRSA, PCR: NEGATIVE
Staphylococcus aureus: NEGATIVE

## 2016-06-30 NOTE — Progress Notes (Signed)
Anesthesia Chart Review: Patient is a 61 year old male scheduled for left TKA on 07/11/16 by Dr. Noemi Chapel. Case is posted for spinal anesthesia.   History includes never smoker, diastolic CHF, DM2, HTN, HLD, GERD, hypothyroidism, morbid obesity s/p gastric bypass '85, NASH with splenomegaly and thrombocytopenia, OSA (unable to tolerate CPAP), sensorineural hearing loss, right TKA 06/08/15. BMI is consistent with morbid obesity.    - PCP is Dr. [name], last visit 06/08/16 for follow-up and preoperative evaluation. He signed a note of medical clearance (see Media tab). - Cardiologist is Dr. Ida Rogue, last visit 06/07/16. No further ischemic work-up recommended at that time.  - GI is Dr. Zenovia Jarred.   Meds include albuterol, aspirin 81 mg, Flonase, Lasix, Amaryl, insulin glargine, levothyroxine, lisinopril, metformin, Reglan, Lopressor, Nexium, nitroglycerin, KCl, red yeast rice, tramadol, vitamin E.  Vitals: BP 134/75, HR 73, O2 sat 98%, T 36.3 C. CBG 159. HT 5' 10" . WT 335 lb (152 kg). BMET 48.2.  EKG 06/07/16: NSR, right BBB.  Nuclear stress test 03/23/15:  Defect 1: There is a medium defect of moderate severity.  Findings consistent with ischemia.  Nuclear stress EF: 56%.  There was no ST segment deviation noted during stress.  T wave inversion was noted during stress in the V1 and V3 leads, beginning at 0 minutes of stress. T wave inversion persisted.  Moderate size and intensity, partially reversible inferior defect suggestive of ischemia. Elevated TID ratio of 1.24. LVEF 56% with normal wall motion. This is an intermediate risk study. Cardiac cath recommended (see below).  Cardiac cath 03/24/15:   Prox RCA to Mid RCA lesion, 10% stenosed.  Prox LAD to Mid LAD lesion, 25% stenosed.  Prox Cx lesion, 30% stenosed.  The left ventricular systolic function is normal. 1. Nonobstructive CAD. No significant change since 2014. 2. Normal LV function. Plan: risk factor  modification.  Echo 03/24/15: Study Conclusions - Left ventricle: The cavity size was normal. Wall thickness was   increased in a pattern of mild LVH. Systolic function was normal.   The estimated ejection fraction was in the range of 50% to 55%.   Wall motion was normal; there were no regional wall motion   abnormalities. - Mitral valve: There was mild regurgitation. - Right ventricle: The cavity size was mildly dilated. Wall   thickness was normal. - Right atrium: The atrium was moderately dilated. Impressions: - Compared to the prior study, there has been no significant   interval change.  Carotid U/S 10/31/13: Impression:  Heterogeneous plaque, bilaterally. 1-39% bilateral ICA stenosis. Patent vertebral arteries with antegrade flow. Normal subclavian arteries, bilaterally.  CXR 06/08/16: IMPRESSION: 1.  Cardiomegaly.  No pulmonary venous congestion. 2.  No acute pulmonary disease .  Spirometry 05/19/15: FVC 3.28 (72%), FEV1 2.93 (81%), FEF 25-75% 4.88 (142%). Mild restriction.  Preoperative labs noted. Cr 0.92. AST/ALT 40/41. H/H 13.4/41.3, PLT 131. PT/PTT WNL. Glucose 84. Urine culture in process. T&S done. A1c on 06/08/16 was 7.4.   If no acute changes then I anticipate that he can proceed as planned.  George Hugh [facility] Short Stay Center/Anesthesiology Phone [phone] 06/30/2016 1:19 PM

## 2016-06-30 NOTE — Progress Notes (Signed)
   06/30/16 1034  OBSTRUCTIVE SLEEP APNEA  Have you ever been diagnosed with sleep apnea through a sleep study? Yes (back in the 1980's)  If yes, do you have and use a CPAP or BPAP machine every night? 0  Do you snore loudly (loud enough to be heard through closed doors)?  0  Do you often feel tired, fatigued, or sleepy during the daytime (such as falling asleep during driving or talking to someone)? 0  Has anyone observed you stop breathing during your sleep? 0  Do you have, or are you being treated for high blood pressure? 0  BMI more than 35 kg/m2? 1  Age > 50 (1-yes) 1  Neck circumference greater than:Male 16 inches or larger, Male 17inches or larger? 1  Male Gender (Yes=1) 1  Obstructive Sleep Apnea Score 4  Score 5 or greater  Results sent to PCP

## 2016-06-30 NOTE — Progress Notes (Signed)
PCP is Dr. Raeanne Gathers   LOV 05/2016 Clearance  Note 06/08/16 Cardio is Dr. [name]   LOV 05/2016 Patient was instructed to visit both PCP & Cardio for clearance for his up coming surgery. Am sugars run 111-140.  A1C 7.4    05/2016

## 2016-07-01 ENCOUNTER — Ambulatory Visit: Payer: Medicare Other | Admitting: Family Medicine

## 2016-07-01 LAB — URINE CULTURE

## 2016-07-08 ENCOUNTER — Other Ambulatory Visit: Payer: Self-pay | Admitting: Orthopedic Surgery

## 2016-07-08 MED ORDER — DEXTROSE 5 % IV SOLN
3.0000 g | INTRAVENOUS | Status: AC
  Administered 2016-07-11: 3 g via INTRAVENOUS
  Filled 2016-07-08: qty 3000

## 2016-07-11 ENCOUNTER — Encounter (HOSPITAL_COMMUNITY)
Admission: RE | Disposition: A | Discharge status: Home Health | Payer: Self-pay | Source: Ambulatory Visit | Attending: Orthopedic Surgery

## 2016-07-11 ENCOUNTER — Inpatient Hospital Stay (HOSPITAL_COMMUNITY): Payer: Medicare Other | Admitting: Certified Registered Nurse Anesthetist

## 2016-07-11 ENCOUNTER — Encounter (HOSPITAL_COMMUNITY): Payer: Self-pay | Admitting: Certified Registered Nurse Anesthetist

## 2016-07-11 ENCOUNTER — Inpatient Hospital Stay (HOSPITAL_COMMUNITY): Payer: Medicare Other | Admitting: Vascular Surgery

## 2016-07-11 ENCOUNTER — Inpatient Hospital Stay (HOSPITAL_COMMUNITY)
Admission: RE | Admit: 2016-07-11 | Discharge: 2016-07-13 | DRG: 470 | Disposition: A | Discharge status: Home Health | Payer: Medicare Other | Source: Ambulatory Visit | Attending: Orthopedic Surgery | Admitting: Orthopedic Surgery

## 2016-07-11 DIAGNOSIS — Z6841 Body Mass Index (BMI) 40.0 and over, adult: Secondary | ICD-10-CM | POA: Diagnosis not present

## 2016-07-11 DIAGNOSIS — Z882 Allergy status to sulfonamides status: Secondary | ICD-10-CM

## 2016-07-11 DIAGNOSIS — M1712 Unilateral primary osteoarthritis, left knee: Secondary | ICD-10-CM | POA: Diagnosis not present

## 2016-07-11 DIAGNOSIS — K7581 Nonalcoholic steatohepatitis (NASH): Secondary | ICD-10-CM | POA: Diagnosis present

## 2016-07-11 DIAGNOSIS — E785 Hyperlipidemia, unspecified: Secondary | ICD-10-CM | POA: Diagnosis present

## 2016-07-11 DIAGNOSIS — I2510 Atherosclerotic heart disease of native coronary artery without angina pectoris: Secondary | ICD-10-CM | POA: Diagnosis present

## 2016-07-11 DIAGNOSIS — G8918 Other acute postprocedural pain: Secondary | ICD-10-CM | POA: Diagnosis not present

## 2016-07-11 DIAGNOSIS — D696 Thrombocytopenia, unspecified: Secondary | ICD-10-CM | POA: Diagnosis present

## 2016-07-11 DIAGNOSIS — K3184 Gastroparesis: Secondary | ICD-10-CM | POA: Diagnosis present

## 2016-07-11 DIAGNOSIS — Z885 Allergy status to narcotic agent status: Secondary | ICD-10-CM

## 2016-07-11 DIAGNOSIS — Z96651 Presence of right artificial knee joint: Secondary | ICD-10-CM | POA: Diagnosis present

## 2016-07-11 DIAGNOSIS — J302 Other seasonal allergic rhinitis: Secondary | ICD-10-CM | POA: Diagnosis present

## 2016-07-11 DIAGNOSIS — Z9884 Bariatric surgery status: Secondary | ICD-10-CM | POA: Diagnosis not present

## 2016-07-11 DIAGNOSIS — Z7951 Long term (current) use of inhaled steroids: Secondary | ICD-10-CM

## 2016-07-11 DIAGNOSIS — Z7982 Long term (current) use of aspirin: Secondary | ICD-10-CM | POA: Diagnosis not present

## 2016-07-11 DIAGNOSIS — E11319 Type 2 diabetes mellitus with unspecified diabetic retinopathy without macular edema: Secondary | ICD-10-CM | POA: Diagnosis present

## 2016-07-11 DIAGNOSIS — M25562 Pain in left knee: Secondary | ICD-10-CM | POA: Diagnosis not present

## 2016-07-11 DIAGNOSIS — I5032 Chronic diastolic (congestive) heart failure: Secondary | ICD-10-CM | POA: Diagnosis present

## 2016-07-11 DIAGNOSIS — K7460 Unspecified cirrhosis of liver: Secondary | ICD-10-CM | POA: Diagnosis present

## 2016-07-11 DIAGNOSIS — I110 Hypertensive heart disease with heart failure: Secondary | ICD-10-CM | POA: Diagnosis not present

## 2016-07-11 DIAGNOSIS — G4733 Obstructive sleep apnea (adult) (pediatric): Secondary | ICD-10-CM | POA: Diagnosis present

## 2016-07-11 DIAGNOSIS — E1165 Type 2 diabetes mellitus with hyperglycemia: Secondary | ICD-10-CM | POA: Diagnosis present

## 2016-07-11 DIAGNOSIS — Z794 Long term (current) use of insulin: Secondary | ICD-10-CM

## 2016-07-11 DIAGNOSIS — K219 Gastro-esophageal reflux disease without esophagitis: Secondary | ICD-10-CM | POA: Diagnosis not present

## 2016-07-11 DIAGNOSIS — E039 Hypothyroidism, unspecified: Secondary | ICD-10-CM | POA: Diagnosis present

## 2016-07-11 DIAGNOSIS — H903 Sensorineural hearing loss, bilateral: Secondary | ICD-10-CM | POA: Diagnosis present

## 2016-07-11 DIAGNOSIS — E1143 Type 2 diabetes mellitus with diabetic autonomic (poly)neuropathy: Secondary | ICD-10-CM | POA: Diagnosis present

## 2016-07-11 DIAGNOSIS — Z888 Allergy status to other drugs, medicaments and biological substances status: Secondary | ICD-10-CM

## 2016-07-11 DIAGNOSIS — E1169 Type 2 diabetes mellitus with other specified complication: Secondary | ICD-10-CM | POA: Diagnosis present

## 2016-07-11 DIAGNOSIS — I10 Essential (primary) hypertension: Secondary | ICD-10-CM | POA: Diagnosis present

## 2016-07-11 DIAGNOSIS — I499 Cardiac arrhythmia, unspecified: Secondary | ICD-10-CM | POA: Diagnosis present

## 2016-07-11 DIAGNOSIS — Z886 Allergy status to analgesic agent status: Secondary | ICD-10-CM

## 2016-07-11 DIAGNOSIS — Z974 Presence of external hearing-aid: Secondary | ICD-10-CM

## 2016-07-11 DIAGNOSIS — M1711 Unilateral primary osteoarthritis, right knee: Secondary | ICD-10-CM | POA: Diagnosis present

## 2016-07-11 DIAGNOSIS — E1140 Type 2 diabetes mellitus with diabetic neuropathy, unspecified: Secondary | ICD-10-CM | POA: Diagnosis present

## 2016-07-11 DIAGNOSIS — R739 Hyperglycemia, unspecified: Secondary | ICD-10-CM | POA: Diagnosis not present

## 2016-07-11 HISTORY — DX: Unilateral primary osteoarthritis, left knee: M17.12

## 2016-07-11 HISTORY — PX: TOTAL KNEE ARTHROPLASTY: SHX125

## 2016-07-11 HISTORY — DX: Hyperglycemia, unspecified: R73.9

## 2016-07-11 LAB — GLUCOSE, CAPILLARY
GLUCOSE-CAPILLARY: 123 mg/dL — AB (ref 65–99)
GLUCOSE-CAPILLARY: 92 mg/dL (ref 65–99)
GLUCOSE-CAPILLARY: 223 mg/dL — AB (ref 65–99)
Glucose-Capillary: 135 mg/dL — ABNORMAL HIGH (ref 65–99)
Glucose-Capillary: 305 mg/dL — ABNORMAL HIGH (ref 65–99)

## 2016-07-11 SURGERY — ARTHROPLASTY, KNEE, TOTAL
Anesthesia: Spinal | Site: Knee | Laterality: Left

## 2016-07-11 MED ORDER — TAMSULOSIN HCL 0.4 MG PO CAPS
0.4000 mg | ORAL_CAPSULE | Freq: Every day | ORAL | Status: DC
  Administered 2016-07-11 – 2016-07-12 (×2): 0.4 mg via ORAL
  Filled 2016-07-11 (×2): qty 1

## 2016-07-11 MED ORDER — LEVOTHYROXINE SODIUM 112 MCG PO TABS
112.0000 ug | ORAL_TABLET | Freq: Every day | ORAL | Status: DC
  Administered 2016-07-12: 112 ug via ORAL
  Filled 2016-07-11 (×2): qty 1

## 2016-07-11 MED ORDER — DEXAMETHASONE SODIUM PHOSPHATE 10 MG/ML IJ SOLN
10.0000 mg | Freq: Three times a day (TID) | INTRAMUSCULAR | Status: AC
  Administered 2016-07-11 – 2016-07-12 (×4): 10 mg via INTRAVENOUS
  Filled 2016-07-11 (×4): qty 1

## 2016-07-11 MED ORDER — EPINEPHRINE PF 1 MG/ML IJ SOLN
INTRAMUSCULAR | Status: DC | PRN
  Administered 2016-07-11: .15 mL via SUBCUTANEOUS

## 2016-07-11 MED ORDER — ONDANSETRON HCL 4 MG/2ML IJ SOLN: 4.0000 mg | Freq: Four times a day (QID) | INTRAMUSCULAR | Status: DC | PRN

## 2016-07-11 MED ORDER — METOPROLOL TARTRATE 25 MG PO TABS
25.0000 mg | ORAL_TABLET | Freq: Two times a day (BID) | ORAL | Status: DC
  Administered 2016-07-11 – 2016-07-13 (×4): 25 mg via ORAL
  Filled 2016-07-11 (×5): qty 1

## 2016-07-11 MED ORDER — FLUTICASONE PROPIONATE 50 MCG/ACT NA SUSP
2.0000 | Freq: Every day | NASAL | Status: DC
  Administered 2016-07-11 – 2016-07-13 (×3): 2 via NASAL
  Filled 2016-07-11: qty 16

## 2016-07-11 MED ORDER — LACTATED RINGERS IV SOLN: INTRAVENOUS | Status: DC

## 2016-07-11 MED ORDER — HYDROMORPHONE HCL 2 MG PO TABS
2.0000 mg | ORAL_TABLET | ORAL | Status: DC | PRN
  Administered 2016-07-11 – 2016-07-13 (×6): 4 mg via ORAL
  Filled 2016-07-11 (×7): qty 2

## 2016-07-11 MED ORDER — ROCURONIUM BROMIDE 10 MG/ML (PF) SYRINGE
PREFILLED_SYRINGE | INTRAVENOUS | Status: AC
  Filled 2016-07-11: qty 5

## 2016-07-11 MED ORDER — POTASSIUM CHLORIDE IN NACL 20-0.9 MEQ/L-% IV SOLN
INTRAVENOUS | Status: DC
  Administered 2016-07-11 – 2016-07-12 (×2): via INTRAVENOUS
  Filled 2016-07-11 (×2): qty 1000

## 2016-07-11 MED ORDER — BUPIVACAINE IN DEXTROSE 0.75-8.25 % IT SOLN
INTRATHECAL | Status: DC | PRN
  Administered 2016-07-11: 1.6 mL via INTRATHECAL

## 2016-07-11 MED ORDER — METOCLOPRAMIDE HCL 5 MG PO TABS: 5.0000 mg | ORAL_TABLET | Freq: Three times a day (TID) | ORAL | Status: DC | PRN

## 2016-07-11 MED ORDER — BUPIVACAINE HCL (PF) 0.25 % IJ SOLN
INTRAMUSCULAR | Status: DC | PRN
  Administered 2016-07-11: 30 mL

## 2016-07-11 MED ORDER — MEPERIDINE HCL 25 MG/ML IJ SOLN: 6.2500 mg | INTRAMUSCULAR | Status: DC | PRN

## 2016-07-11 MED ORDER — INSULIN ASPART 100 UNIT/ML ~~LOC~~ SOLN
6.0000 [IU] | Freq: Three times a day (TID) | SUBCUTANEOUS | Status: DC
  Administered 2016-07-11 – 2016-07-13 (×5): 6 [IU] via SUBCUTANEOUS

## 2016-07-11 MED ORDER — MENTHOL 3 MG MT LOZG: 1.0000 | LOZENGE | OROMUCOSAL | Status: DC | PRN

## 2016-07-11 MED ORDER — MIDAZOLAM HCL 2 MG/2ML IJ SOLN
2.0000 mg | Freq: Once | INTRAMUSCULAR | Status: AC
  Administered 2016-07-11: 2 mg via INTRAVENOUS
  Filled 2016-07-11: qty 2

## 2016-07-11 MED ORDER — ACETAMINOPHEN 650 MG RE SUPP: 650.0000 mg | Freq: Four times a day (QID) | RECTAL | Status: DC | PRN

## 2016-07-11 MED ORDER — METOCLOPRAMIDE HCL 10 MG PO TABS
10.0000 mg | ORAL_TABLET | Freq: Three times a day (TID) | ORAL | Status: DC
  Administered 2016-07-11 – 2016-07-13 (×8): 10 mg via ORAL
  Filled 2016-07-11 (×8): qty 1

## 2016-07-11 MED ORDER — ALBUTEROL SULFATE (2.5 MG/3ML) 0.083% IN NEBU: 2.5000 mg | INHALATION_SOLUTION | Freq: Four times a day (QID) | RESPIRATORY_TRACT | Status: DC | PRN

## 2016-07-11 MED ORDER — LACTATED RINGERS IV SOLN
INTRAVENOUS | Status: DC
  Administered 2016-07-11: 08:00:00 via INTRAVENOUS

## 2016-07-11 MED ORDER — PROPOFOL 10 MG/ML IV BOLUS
INTRAVENOUS | Status: DC | PRN
  Administered 2016-07-11: 20 mg via INTRAVENOUS

## 2016-07-11 MED ORDER — NITROGLYCERIN 0.4 MG SL SUBL: 0.4000 mg | SUBLINGUAL_TABLET | SUBLINGUAL | Status: DC | PRN

## 2016-07-11 MED ORDER — METOCLOPRAMIDE HCL 5 MG/ML IJ SOLN: 5.0000 mg | Freq: Three times a day (TID) | INTRAMUSCULAR | Status: DC | PRN

## 2016-07-11 MED ORDER — FENTANYL CITRATE (PF) 100 MCG/2ML IJ SOLN
25.0000 ug | INTRAMUSCULAR | Status: DC | PRN
  Administered 2016-07-11: 50 ug via INTRAVENOUS

## 2016-07-11 MED ORDER — POLYETHYLENE GLYCOL 3350 17 G PO PACK
17.0000 g | PACK | Freq: Two times a day (BID) | ORAL | Status: DC
Start: 2016-07-11 — End: 2016-07-13
  Administered 2016-07-11 – 2016-07-13 (×5): 17 g via ORAL
  Filled 2016-07-11 (×5): qty 1

## 2016-07-11 MED ORDER — DEXAMETHASONE SODIUM PHOSPHATE 10 MG/ML IJ SOLN
INTRAMUSCULAR | Status: DC | PRN
  Administered 2016-07-11: 10 mg via INTRAVENOUS

## 2016-07-11 MED ORDER — ONDANSETRON HCL 4 MG/2ML IJ SOLN
INTRAMUSCULAR | Status: DC | PRN
  Administered 2016-07-11: 4 mg via INTRAVENOUS

## 2016-07-11 MED ORDER — ASPIRIN EC 325 MG PO TBEC
325.0000 mg | DELAYED_RELEASE_TABLET | Freq: Every day | ORAL | Status: DC
  Administered 2016-07-12 – 2016-07-13 (×2): 325 mg via ORAL
  Filled 2016-07-11 (×2): qty 1

## 2016-07-11 MED ORDER — CHLORHEXIDINE GLUCONATE 4 % EX LIQD: 60.0000 mL | Freq: Once | CUTANEOUS | Status: DC

## 2016-07-11 MED ORDER — MORPHINE SULFATE (PF) 4 MG/ML IV SOLN: 2.0000 mg | INTRAVENOUS | Status: DC | PRN

## 2016-07-11 MED ORDER — BUPIVACAINE-EPINEPHRINE 0.25% -1:200000 IJ SOLN: INTRAMUSCULAR | Status: DC | PRN

## 2016-07-11 MED ORDER — LIDOCAINE 2% (20 MG/ML) 5 ML SYRINGE
INTRAMUSCULAR | Status: AC
  Filled 2016-07-11: qty 5

## 2016-07-11 MED ORDER — TRANEXAMIC ACID 1000 MG/10ML IV SOLN
1000.0000 mg | Freq: Once | INTRAVENOUS | Status: AC
  Administered 2016-07-11: 1000 mg via INTRAVENOUS
  Filled 2016-07-11: qty 10

## 2016-07-11 MED ORDER — INSULIN ASPART 100 UNIT/ML ~~LOC~~ SOLN
0.0000 [IU] | Freq: Three times a day (TID) | SUBCUTANEOUS | Status: DC
  Administered 2016-07-11: 15 [IU] via SUBCUTANEOUS
  Administered 2016-07-12: 11 [IU] via SUBCUTANEOUS
  Administered 2016-07-12 (×2): 7 [IU] via SUBCUTANEOUS
  Administered 2016-07-13: 4 [IU] via SUBCUTANEOUS
  Administered 2016-07-13: 7 [IU] via SUBCUTANEOUS

## 2016-07-11 MED ORDER — PHENOL 1.4 % MT LIQD: 1.0000 | OROMUCOSAL | Status: DC | PRN

## 2016-07-11 MED ORDER — EPINEPHRINE PF 1 MG/ML IJ SOLN
INTRAMUSCULAR | Status: AC
  Filled 2016-07-11: qty 1

## 2016-07-11 MED ORDER — POVIDONE-IODINE 7.5 % EX SOLN
Freq: Once | CUTANEOUS | Status: DC
  Filled 2016-07-11: qty 118

## 2016-07-11 MED ORDER — SODIUM CHLORIDE 0.9 % IR SOLN
Status: DC | PRN
  Administered 2016-07-11: 3000 mL

## 2016-07-11 MED ORDER — FENTANYL CITRATE (PF) 250 MCG/5ML IJ SOLN
INTRAMUSCULAR | Status: AC
  Filled 2016-07-11: qty 5

## 2016-07-11 MED ORDER — METOCLOPRAMIDE HCL 5 MG/ML IJ SOLN: 10.0000 mg | Freq: Once | INTRAMUSCULAR | Status: DC | PRN

## 2016-07-11 MED ORDER — DIPHENHYDRAMINE HCL 12.5 MG/5ML PO ELIX: 12.5000 mg | ORAL_SOLUTION | ORAL | Status: DC | PRN

## 2016-07-11 MED ORDER — FENTANYL CITRATE (PF) 100 MCG/2ML IJ SOLN
INTRAMUSCULAR | Status: AC
  Administered 2016-07-11: 50 ug via INTRAVENOUS
  Filled 2016-07-11: qty 2

## 2016-07-11 MED ORDER — SODIUM CHLORIDE 0.9 % IR SOLN
Status: DC | PRN
  Administered 2016-07-11: 1000 mL

## 2016-07-11 MED ORDER — PROPOFOL 500 MG/50ML IV EMUL
INTRAVENOUS | Status: DC | PRN
  Administered 2016-07-11: 100 ug/kg/min via INTRAVENOUS

## 2016-07-11 MED ORDER — ACETAMINOPHEN 500 MG PO TABS
1000.0000 mg | ORAL_TABLET | Freq: Four times a day (QID) | ORAL | Status: AC
  Administered 2016-07-11 (×2): 1000 mg via ORAL
  Filled 2016-07-11 (×2): qty 2

## 2016-07-11 MED ORDER — MIDAZOLAM HCL 2 MG/2ML IJ SOLN
INTRAMUSCULAR | Status: AC
  Filled 2016-07-11: qty 2

## 2016-07-11 MED ORDER — INSULIN GLARGINE 100 UNIT/ML ~~LOC~~ SOLN
28.0000 [IU] | Freq: Every day | SUBCUTANEOUS | Status: DC
  Administered 2016-07-11: 28 [IU] via SUBCUTANEOUS
  Filled 2016-07-11: qty 0.28

## 2016-07-11 MED ORDER — PHENYLEPHRINE 40 MCG/ML (10ML) SYRINGE FOR IV PUSH (FOR BLOOD PRESSURE SUPPORT)
PREFILLED_SYRINGE | INTRAVENOUS | Status: AC
  Filled 2016-07-11: qty 10

## 2016-07-11 MED ORDER — LISINOPRIL 20 MG PO TABS
20.0000 mg | ORAL_TABLET | Freq: Every day | ORAL | Status: DC
  Administered 2016-07-13: 20 mg via ORAL
  Filled 2016-07-11: qty 1

## 2016-07-11 MED ORDER — ACETAMINOPHEN 325 MG PO TABS: 650.0000 mg | ORAL_TABLET | Freq: Four times a day (QID) | ORAL | Status: DC | PRN

## 2016-07-11 MED ORDER — BUPIVACAINE-EPINEPHRINE (PF) 0.5% -1:200000 IJ SOLN
INTRAMUSCULAR | Status: DC | PRN
  Administered 2016-07-11: 30 mL via PERINEURAL

## 2016-07-11 MED ORDER — BUPIVACAINE HCL (PF) 0.25 % IJ SOLN
INTRAMUSCULAR | Status: AC
  Filled 2016-07-11: qty 30

## 2016-07-11 MED ORDER — EPHEDRINE 5 MG/ML INJ
INTRAVENOUS | Status: AC
  Filled 2016-07-11: qty 10

## 2016-07-11 MED ORDER — CEFAZOLIN SODIUM-DEXTROSE 2-4 GM/100ML-% IV SOLN
2.0000 g | Freq: Four times a day (QID) | INTRAVENOUS | Status: AC
  Administered 2016-07-11 (×2): 2 g via INTRAVENOUS
  Filled 2016-07-11 (×2): qty 100

## 2016-07-11 MED ORDER — PROPOFOL 1000 MG/100ML IV EMUL
INTRAVENOUS | Status: AC
Start: 2016-07-11 — End: 2016-07-11
  Filled 2016-07-11: qty 100

## 2016-07-11 MED ORDER — PANTOPRAZOLE SODIUM 40 MG PO TBEC
40.0000 mg | DELAYED_RELEASE_TABLET | Freq: Every day | ORAL | Status: DC
  Administered 2016-07-11 – 2016-07-13 (×3): 40 mg via ORAL
  Filled 2016-07-11 (×3): qty 1

## 2016-07-11 MED ORDER — INSULIN ASPART 100 UNIT/ML ~~LOC~~ SOLN
0.0000 [IU] | Freq: Every day | SUBCUTANEOUS | Status: DC
  Administered 2016-07-11: 2 [IU] via SUBCUTANEOUS
  Administered 2016-07-12: 3 [IU] via SUBCUTANEOUS

## 2016-07-11 MED ORDER — SODIUM CHLORIDE 0.9 % IV SOLN
1000.0000 mg | INTRAVENOUS | Status: AC
  Administered 2016-07-11: 1000 mg via INTRAVENOUS
  Filled 2016-07-11: qty 10

## 2016-07-11 MED ORDER — FENTANYL CITRATE (PF) 250 MCG/5ML IJ SOLN
INTRAMUSCULAR | Status: DC | PRN
  Administered 2016-07-11 (×2): 50 ug via INTRAVENOUS

## 2016-07-11 MED ORDER — FENTANYL CITRATE (PF) 100 MCG/2ML IJ SOLN
100.0000 ug | Freq: Once | INTRAMUSCULAR | Status: AC
  Administered 2016-07-11: 100 ug via INTRAVENOUS
  Filled 2016-07-11: qty 2

## 2016-07-11 MED ORDER — DOCUSATE SODIUM 100 MG PO CAPS
100.0000 mg | ORAL_CAPSULE | Freq: Two times a day (BID) | ORAL | Status: DC
  Administered 2016-07-11 – 2016-07-13 (×5): 100 mg via ORAL
  Filled 2016-07-11 (×5): qty 1

## 2016-07-11 MED ORDER — PHENYLEPHRINE HCL 10 MG/ML IJ SOLN
INTRAVENOUS | Status: DC | PRN
  Administered 2016-07-11: 20 ug/min via INTRAVENOUS

## 2016-07-11 MED ORDER — ONDANSETRON HCL 4 MG PO TABS: 4.0000 mg | ORAL_TABLET | Freq: Four times a day (QID) | ORAL | Status: DC | PRN

## 2016-07-11 MED ORDER — PROPOFOL 10 MG/ML IV BOLUS
INTRAVENOUS | Status: AC
  Filled 2016-07-11: qty 20

## 2016-07-11 SURGICAL SUPPLY — 78 items
APL SKNCLS STERI-STRIP NONHPOA (GAUZE/BANDAGES/DRESSINGS) ×1
BANDAGE ELASTIC 6 VELCRO ST LF (GAUZE/BANDAGES/DRESSINGS) ×1 IMPLANT
BANDAGE ESMARK 6X9 LF (GAUZE/BANDAGES/DRESSINGS) ×1 IMPLANT
BENZOIN TINCTURE PRP APPL 2/3 (GAUZE/BANDAGES/DRESSINGS) ×2 IMPLANT
BLADE SAGITTAL 25.0X1.19X90 (BLADE) ×2 IMPLANT
BLADE SAW SGTL 13X75X1.27 (BLADE) ×2 IMPLANT
BLADE SURG 10 STRL SS (BLADE) ×4 IMPLANT
BNDG CMPR 9X6 STRL LF SNTH (GAUZE/BANDAGES/DRESSINGS) ×1
BNDG CMPR MED 15X6 ELC VLCR LF (GAUZE/BANDAGES/DRESSINGS)
BNDG ELASTIC 6X15 VLCR STRL LF (GAUZE/BANDAGES/DRESSINGS) ×1 IMPLANT
BNDG ESMARK 6X9 LF (GAUZE/BANDAGES/DRESSINGS) ×2
BOWL SMART MIX CTS (DISPOSABLE) ×2 IMPLANT
CAPT KNEE TOTAL 3 ATTUNE ×1 IMPLANT
CEMENT HV SMART SET (Cement) ×4 IMPLANT
CLSR STERI-STRIP ANTIMIC 1/2X4 (GAUZE/BANDAGES/DRESSINGS) ×1 IMPLANT
COVER SURGICAL LIGHT HANDLE (MISCELLANEOUS) ×2 IMPLANT
CUFF TOURNIQUET SINGLE 34IN LL (TOURNIQUET CUFF) ×2 IMPLANT
CUFF TOURNIQUET SINGLE 44IN (TOURNIQUET CUFF) IMPLANT
DECANTER SPIKE VIAL GLASS SM (MISCELLANEOUS) ×2 IMPLANT
DRAPE EXTREMITY T 121X128X90 (DRAPE) ×2 IMPLANT
DRAPE HALF SHEET 40X57 (DRAPES) ×4 IMPLANT
DRAPE INCISE IOBAN 66X45 STRL (DRAPES) IMPLANT
DRAPE U-SHAPE 47X51 STRL (DRAPES) ×2 IMPLANT
DRSG AQUACEL AG ADV 3.5X14 (GAUZE/BANDAGES/DRESSINGS) ×2 IMPLANT
DURAPREP 26ML APPLICATOR (WOUND CARE) ×4 IMPLANT
ELECT CAUTERY BLADE 6.4 (BLADE) ×2 IMPLANT
ELECT REM PT RETURN 9FT ADLT (ELECTROSURGICAL) ×2
ELECTRODE REM PT RTRN 9FT ADLT (ELECTROSURGICAL) ×1 IMPLANT
FACESHIELD WRAPAROUND (MASK) ×2 IMPLANT
FACESHIELD WRAPAROUND OR TEAM (MASK) ×1 IMPLANT
GLOVE BIO SURGEON STRL SZ7 (GLOVE) ×2 IMPLANT
GLOVE BIOGEL PI IND STRL 6.5 (GLOVE) IMPLANT
GLOVE BIOGEL PI IND STRL 7.0 (GLOVE) ×1 IMPLANT
GLOVE BIOGEL PI IND STRL 7.5 (GLOVE) ×1 IMPLANT
GLOVE BIOGEL PI INDICATOR 6.5 (GLOVE) ×2
GLOVE BIOGEL PI INDICATOR 7.0 (GLOVE) ×1
GLOVE BIOGEL PI INDICATOR 7.5 (GLOVE) ×1
GLOVE BIOGEL PI ORTHO PRO SZ7 (GLOVE) ×1
GLOVE PI ORTHO PRO STRL SZ7 (GLOVE) IMPLANT
GLOVE SS BIOGEL STRL SZ 7.5 (GLOVE) ×1 IMPLANT
GLOVE SUPERSENSE BIOGEL SZ 7.5 (GLOVE) ×1
GLOVE SURG SS PI 7.0 STRL IVOR (GLOVE) ×1 IMPLANT
GOWN STRL REUS W/ TWL LRG LVL3 (GOWN DISPOSABLE) ×1 IMPLANT
GOWN STRL REUS W/ TWL XL LVL3 (GOWN DISPOSABLE) ×2 IMPLANT
GOWN STRL REUS W/TWL 2XL LVL3 (GOWN DISPOSABLE) ×1 IMPLANT
GOWN STRL REUS W/TWL LRG LVL3 (GOWN DISPOSABLE)
GOWN STRL REUS W/TWL XL LVL3 (GOWN DISPOSABLE) ×6
HANDPIECE INTERPULSE COAX TIP (DISPOSABLE) ×2
HOOD PEEL AWAY FACE SHEILD DIS (HOOD) ×5 IMPLANT
IMMOBILIZER KNEE 22 (SOFTGOODS) ×1 IMPLANT
IMMOBILIZER KNEE 22 UNIV (SOFTGOODS) ×2 IMPLANT
KIT BASIN OR (CUSTOM PROCEDURE TRAY) ×2 IMPLANT
KIT ROOM TURNOVER OR (KITS) ×2 IMPLANT
MANIFOLD NEPTUNE II (INSTRUMENTS) ×2 IMPLANT
MARKER SKIN DUAL TIP RULER LAB (MISCELLANEOUS) ×2 IMPLANT
NDL 18GX1X1/2 (RX/OR ONLY) (NEEDLE) ×1 IMPLANT
NEEDLE 18GX1X1/2 (RX/OR ONLY) (NEEDLE) ×2 IMPLANT
NS IRRIG 1000ML POUR BTL (IV SOLUTION) ×2 IMPLANT
PACK TOTAL JOINT (CUSTOM PROCEDURE TRAY) ×2 IMPLANT
PAD ARMBOARD 7.5X6 YLW CONV (MISCELLANEOUS) ×4 IMPLANT
SET HNDPC FAN SPRY TIP SCT (DISPOSABLE) ×1 IMPLANT
STRIP CLOSURE SKIN 1/2X4 (GAUZE/BANDAGES/DRESSINGS) ×2 IMPLANT
SUCTION FRAZIER HANDLE 10FR (MISCELLANEOUS) ×1
SUCTION TUBE FRAZIER 10FR DISP (MISCELLANEOUS) ×1 IMPLANT
SUT MNCRL AB 3-0 PS2 18 (SUTURE) ×2 IMPLANT
SUT VIC AB 0 CT1 27 (SUTURE) ×4
SUT VIC AB 0 CT1 27XBRD ANBCTR (SUTURE) ×2 IMPLANT
SUT VIC AB 1 CT1 27 (SUTURE) ×2
SUT VIC AB 1 CT1 27XBRD ANBCTR (SUTURE) ×1 IMPLANT
SUT VIC AB 2-0 CT1 27 (SUTURE) ×4
SUT VIC AB 2-0 CT1 TAPERPNT 27 (SUTURE) ×2 IMPLANT
SYR 30ML LL (SYRINGE) ×2 IMPLANT
TOWEL OR 17X24 6PK STRL BLUE (TOWEL DISPOSABLE) ×2 IMPLANT
TOWEL OR 17X26 10 PK STRL BLUE (TOWEL DISPOSABLE) ×2 IMPLANT
TRAY CATH 16FR W/PLASTIC CATH (SET/KITS/TRAYS/PACK) IMPLANT
TRAY FOLEY CATH SILVER 16FR (SET/KITS/TRAYS/PACK) ×2 IMPLANT
TUBE CONNECTING 12X1/4 (SUCTIONS) ×2 IMPLANT
YANKAUER SUCT BULB TIP NO VENT (SUCTIONS) ×2 IMPLANT

## 2016-07-11 NOTE — Transfer of Care (Signed)
Immediate Anesthesia Transfer of Care Note  Patient: Chad [name]  Procedure(s) Performed: Procedure(s): TOTAL KNEE ARTHROPLASTY LEFT (Left)  Patient Location: PACU  Anesthesia Type:Spinal and MAC combined with regional for post-op pain  Level of Consciousness: awake, alert , oriented and patient cooperative  Airway & Oxygen Therapy: Patient Spontanous Breathing  Post-op Assessment: Report given to RN and Post -op Vital signs reviewed and stable  Post vital signs: Reviewed and stable  Last Vitals:  Vitals:   07/11/16 0704 07/11/16 1140  BP: (!) 160/70 121/66  Pulse: 66 77  Resp: 18 14  Temp: 36.6 C 36.5 C    Last Pain:  Vitals:   07/11/16 1140  TempSrc:   PainSc: (P) 0-No pain      Patients Stated Pain Goal: 5 (33/35/45 6256)  Complications: No apparent anesthesia complications

## 2016-07-11 NOTE — Interval H&P Note (Signed)
History and Physical Interval Note:  07/11/2016 7:17 AM  Chad [name]  has presented today for surgery, with the diagnosis of OA LEFT KNEE  The various methods of treatment have been discussed with the patient and family. After consideration of risks, benefits and other options for treatment, the patient has consented to  Procedure(s): TOTAL KNEE ARTHROPLASTY (Left) as a surgical intervention .  The patient's history has been reviewed, patient examined, no change in status, stable for surgery.  I have reviewed the patient's chart and labs.  Questions were answered to the patient's satisfaction.     [name]

## 2016-07-11 NOTE — Anesthesia Procedure Notes (Signed)
Spinal  Patient location during procedure: OR Staffing Anesthesiologist: Jourdyn Ferrin Performed: anesthesiologist  Preanesthetic Checklist Completed: patient identified, site marked, surgical consent, pre-op evaluation, timeout performed, IV checked, risks and benefits discussed and monitors and equipment checked Spinal Block Patient position: sitting Prep: Betadine Patient monitoring: heart rate, continuous pulse ox and blood pressure Approach: right paramedian Location: L3-4 Injection technique: single-shot Needle Needle type: Sprotte  Needle gauge: 24 G Needle length: 9 cm Additional Notes Expiration date of kit checked and confirmed. Patient tolerated procedure well, without complications.       

## 2016-07-11 NOTE — Progress Notes (Signed)
Orthopedic Tech Progress Note Patient Details:  Chad [name] Jun 04, 1955 [id number]  CPM Left Knee CPM Left Knee: On Left Knee Flexion (Degrees): 90 Left Knee Extension (Degrees): 0 Additional Comments: applied cpm to pt left knee leg at 0-90 degress.  pt tolerated well.  provided bone foam zero degree at bedside. Left   Chad [name] 07/11/2016, 12:12 PM

## 2016-07-11 NOTE — Evaluation (Signed)
Physical Therapy Evaluation Patient Details Name: MACALLAN ORD MRN: [id number] DOB: 1955-07-27 Today's Date: 07/11/2016   History of Present Illness  Pt is 61 y/o male s/p elective L TKA secondary to L knee OA. PMH includes DM, obesity, HTN, 1st degree AV block, CHF, liver cirrhosis, L shoulder surgery and R TKA in 2017.   Clinical Impression  Pt is s/p surgery above with deficits below. PTA, pt was independent with functional mobility. Upon evaluation, pt limited by post op pain and weakness as well as decreased balance. Pt required min guard for safety throughout mobility. Pt reports wife will be able to assist as needed upon return home and pt has all necessary equipment. Follow up recommendations based on MD arrangements. Will continue to follow to progress mobility.      Follow Up Recommendations DC plan and follow up therapy as arranged by surgeon;Supervision/Assistance - 24 hour    Equipment Recommendations  None recommended by PT    Recommendations for Other Services       Precautions / Restrictions Precautions Precautions: Knee Precaution Booklet Issued: Yes (comment) Precaution Comments: Reviewed supine ther ex with pt.  Required Braces or Orthoses: Knee Immobilizer - Left Knee Immobilizer - Left: Other (comment) (until discontinued) Restrictions Weight Bearing Restrictions: Yes LLE Weight Bearing: Weight bearing as tolerated      Mobility  Bed Mobility Overal bed mobility: Needs Assistance Bed Mobility: Supine to Sit     Supine to sit: Min guard     General bed mobility comments: Min guard for safety. Use of bed rails for trunk elevation.   Transfers Overall transfer level: Needs assistance Equipment used: Rolling walker (2 wheeled) Transfers: Sit to/from Stand Sit to Stand: Min guard         General transfer comment: Min guard for safety upon standing. Verbal cues for appropraite hand placement.   Ambulation/Gait Ambulation/Gait assistance: Min  guard Ambulation Distance (Feet): 50 Feet Assistive device: Rolling walker (2 wheeled) Gait Pattern/deviations: Step-to pattern;Decreased step length - right;Decreased weight shift to left;Antalgic Gait velocity: Decreased Gait velocity interpretation: Below normal speed for age/gender General Gait Details: Slow, antalgic gait. Mild unsteadiness requiring min guard for balance. Verbal cues for sequencing with RW.   Stairs            Wheelchair Mobility    Modified Rankin (Stroke Patients Only)       Balance Overall balance assessment: Needs assistance Sitting-balance support: No upper extremity supported;Feet supported Sitting balance-Leahy Scale: Good     Standing balance support: Bilateral upper extremity supported;During functional activity Standing balance-Leahy Scale: Poor Standing balance comment: Reliant on RW for stability.                              Pertinent Vitals/Pain Pain Assessment: 0-10 Pain Score: 7  Pain Location: L knee Pain Descriptors / Indicators: Aching;Operative site guarding;Grimacing Pain Intervention(s): Limited activity within patient's tolerance;Monitored during session;Repositioned    Home Living Family/patient expects to be discharged to:: Private residence Living Arrangements: Spouse/significant other Available Help at Discharge: Family;Available 24 hours/day Type of Home: Mobile home Home Access: Stairs to enter Entrance Stairs-Rails: Right;Left;Can reach both Entrance Stairs-Number of Steps: 4 Home Layout: One level Home Equipment: Walker - 2 wheels;Bedside commode;Cane - single point      Prior Function Level of Independence: Independent               Hand Dominance   Dominant Hand: Right  Extremity/Trunk Assessment   Upper Extremity Assessment Upper Extremity Assessment: Defer to OT evaluation    Lower Extremity Assessment Lower Extremity Assessment: LLE deficits/detail LLE Deficits / Details:  Sensory in tact. Deficits consistent with post op pain and weakness. Able to perform ther ex below.     Cervical / Trunk Assessment Cervical / Trunk Assessment: Normal  Communication   Communication: No difficulties  Cognition Arousal/Alertness: Awake/alert Behavior During Therapy: WFL for tasks assessed/performed Overall Cognitive Status: Within Functional Limits for tasks assessed                                        General Comments General comments (skin integrity, edema, etc.): Pt's wife present throughout session.     Exercises Total Joint Exercises Ankle Circles/Pumps: AROM;Both;10 reps;Supine Quad Sets: AROM;Left;10 reps;Supine Towel Squeeze: AROM;Both;10 reps;Supine Short Arc Quad: AROM;Left;10 reps;Supine Heel Slides: AROM;Left;10 reps;Supine Hip ABduction/ADduction: AROM;Left;10 reps;Supine Straight Leg Raises: AROM;Left;10 reps;Supine (slight knee extension lag)   Assessment/Plan    PT Assessment Patient needs continued PT services  PT Problem List Decreased strength;Decreased range of motion;Decreased balance;Decreased mobility;Decreased knowledge of use of DME;Decreased knowledge of precautions;Pain;Obesity       PT Treatment Interventions DME instruction;Gait training;Stair training;Functional mobility training;Therapeutic activities;Therapeutic exercise;Balance training;Neuromuscular re-education;Patient/family education    PT Goals (Current goals can be found in the Care Plan section)  Acute Rehab PT Goals Patient Stated Goal: to become independent PT Goal Formulation: With patient Time For Goal Achievement: 07/18/16 Potential to Achieve Goals: Good    Frequency 7X/week   Barriers to discharge        Co-evaluation               AM-PAC PT "6 Clicks" Daily Activity  Outcome Measure Difficulty turning over in bed (including adjusting bedclothes, sheets and blankets)?: A Little Difficulty moving from lying on back to sitting on  the side of the bed? : Total Difficulty sitting down on and standing up from a chair with arms (e.g., wheelchair, bedside commode, etc,.)?: Total Help needed moving to and from a bed to chair (including a wheelchair)?: A Little Help needed walking in hospital room?: A Little Help needed climbing 3-5 steps with a railing? : A Little 6 Click Score: 14    End of Session Equipment Utilized During Treatment: Gait belt Activity Tolerance: Patient tolerated treatment well Patient left: in chair;with call bell/phone within reach;with family/visitor present Nurse Communication: Mobility status PT Visit Diagnosis: Other abnormalities of gait and mobility (R26.89);Pain Pain - Right/Left: Left Pain - part of body: Knee    Time: 9758-8325 PT Time Calculation (min) (ACUTE ONLY): 27 min   Charges:   PT Evaluation $PT Eval Low Complexity: 1 Procedure PT Treatments $Gait Training: 8-22 mins   PT G Codes:        Nicky Pugh, PT, DPT  Acute Rehabilitation Services  Pager: [phone]   Army Melia 07/11/2016, 5:07 PM

## 2016-07-11 NOTE — Op Note (Signed)
MRN:     [id number] DOB/AGE:    October 10, 1955 / 61 y.o.       OPERATIVE REPORT    DATE OF PROCEDURE:  07/11/2016       PREOPERATIVE DIAGNOSIS:   PRIMARY LOCALIZED OSTEOARTHRITIS LEFT KNEE      Estimated body mass index is 48.07 kg/m as calculated from the following:   Height as of this encounter: 5' 10"  (1.778 m).   Weight as of this encounter: 152 kg (335 lb).                                                        POSTOPERATIVE DIAGNOSIS:   SAME                                                                     PROCEDURE:  Procedure(s): TOTAL KNEE ARTHROPLASTY LEFT Using Depuy Attune RP implants #7 Femur, #7Tibia, 90m  RP bearing, 35 Patella     SURGEON: Justine Dines A    ASSISTANT:  Kirstin Shepperson PA-C   (Present and scrubbed throughout the case, critical for assistance with exposure, retraction, instrumentation, and closure.)         ANESTHESIA: Spinal with Adductor Nerve Block     TOURNIQUET TIME: [id number]  COMPLICATIONS:  None     SPECIMENS: None   INDICATIONS FOR PROCEDURE: The patient has  OSTEOARTHRITIS LEFT KNEE, varus deformities, XR shows bone on bone arthritis. Patient has failed all conservative measures including anti-inflammatory medicines, narcotics, attempts at  exercise and weight loss, cortisone injections and viscosupplementation.  Risks and benefits of surgery have been discussed, questions answered.   DESCRIPTION OF PROCEDURE: The patient identified by armband, received  right femoral nerve block and IV antibiotics, [facility] Patient taken to the operating room, appropriate anesthetic  monitors were attached Spinal anesthesia induced with  the patient in supine position, Foley catheter was inserted. Tourniquet  applied high to the operative thigh. Lateral post and foot positioner  applied to the table, the lower extremity was then prepped and draped  in usual sterile fashion from the ankle to the tourniquet. Time-out  procedure was performed. The limb was wrapped with an Esmarch bandage and the tourniquet inflated to 365 mmHg. We began the operation by making the anterior midline incision starting at handbreadth above the patella going over the patella 1 cm medial to and  4 cm distal to the tibial tubercle. Small bleeders in the skin and the  subcutaneous tissue identified and cauterized. Transverse retinaculum was incised and reflected medially and a medial parapatellar arthrotomy was accomplished. the patella was everted and theprepatellar fat pad resected. The superficial medial collateral  ligament was then elevated from anterior to posterior along the proximal  flare of the tibia and anterior half of the menisci resected. The knee was hyperflexed exposing bone on bone arthritis. Peripheral and notch osteophytes as well as the cruciate ligaments were then resected. We continued to  work our way around posteriorly along the proximal tibia, and externally  rotated the tibia subluxing it out  from underneath the femur. A McHale  retractor was placed through the notch and a lateral Hohmann retractor  placed, and we then drilled through the proximal tibia in line with the  axis of the tibia followed by an intramedullary guide rod and 2-degree  posterior slope cutting guide. The tibial cutting guide was pinned into place  allowing resection of 4 mm of bone medially and about 6 mm of bone  laterally because of her varus deformity. Satisfied with the tibial resection, we then  entered the distal femur 2 mm anterior to the PCL origin with the  intramedullary guide rod and applied the distal femoral cutting guide  set at 75m, with 5 degrees of valgus. This was pinned along the  epicondylar axis. At this point, the distal femoral cut was accomplished without difficulty. We then sized for a #7 femoral component and pinned the guide in 3 degrees of external rotation.The chamfer cutting guide was pinned into place. The  anterior, posterior, and chamfer cuts were accomplished without difficulty followed by  the  RP box cutting guide and the box cut. We also removed posterior osteophytes from the posterior femoral condyles. At this  time, the knee was brought into full extension. We checked our  extension and flexion gaps and found them symmetric at 756m  The patella thickness measured at 25 mm. We set the cutting guide at 15 and removed the posterior 9.5-10 mm  of the patella sized for 35 button and drilled the lollipop. The knee  was then once again hyperflexed exposing the proximal tibia. We sized for a #7 tibial base plate, applied the smokestack and the conical reamer followed by the the Delta fin keel punch. We then hammered into place the  RP trial femoral component, inserted a 1 trial bearing, trial patellar button, and took the knee through range of motion from 0-130 degrees. No thumb pressure was required for patellar  tracking. At this point, all trial components were removed, a double batch of DePuy HV cement  was mixed and applied to all bony metallic mating surfaces except for the posterior condyles of the femur itself. In order, we  hammered into place the tibial tray and removed excess cement, the femoral component and removed excess cement, a 5m22mRP bearing  was inserted, and the knee brought to full extension with compression.  The patellar button was clamped into place, and excess cement  removed. While the cement cured the wound was irrigated out with normal saline solution pulse lavage.. Ligament stability and patellar tracking were checked and found to be excellent.. The parapatellar arthrotomy was closed with  #1 Vicryl suture. The subcutaneous tissue with 0 and 2-0 undyed  Vicryl suture, and 4-0 Monocryl.. A dressing of Aquaseal,  4 x 4, dressing sponges, Webril, and Ace wrap applied. Needle and sponge count were correct times 2.The patient awakened, extubated, and taken to recovery room without  difficulty. Vascular status was normal, pulses 2+ and symmetric.   Chad [name] A 07/11/2016, 11:02 AM

## 2016-07-11 NOTE — Anesthesia Procedure Notes (Signed)
Anesthesia Regional Block: Adductor canal block   Pre-Anesthetic Checklist: ,, timeout performed, Correct Patient, Correct Site, Correct Laterality, Correct Procedure, Correct Position, site marked, Risks and benefits discussed,  Surgical consent,  Pre-op evaluation,  At surgeon's request and post-op pain management  Laterality: Left and Lower  Prep: Maximum Sterile Barrier Precautions used, chloraprep       Needles:  Injection technique: Single-shot  Needle Type: Echogenic Stimulator Needle     Needle Length: 10cm      Additional Needles:   Procedures: ultrasound guided,,,,,,,,  Narrative:  Start time: 07/11/2016 8:30 AM End time: 07/11/2016 8:40 AM Injection made incrementally with aspirations every 5 mL.  Performed by: Personally  Anesthesiologist: Montez Hageman  Additional Notes: Patient tolerated the procedure well without complications

## 2016-07-11 NOTE — Anesthesia Preprocedure Evaluation (Addendum)
Anesthesia Evaluation  Patient identified by MRN, date of birth, ID band Patient awake    Reviewed: Allergy & Precautions, NPO status , Patient's Chart, lab work & pertinent test results  Airway Mallampati: II  TM Distance: >3 FB Neck ROM: Full    Dental no notable dental hx.    Pulmonary sleep apnea ,    Pulmonary exam normal breath sounds clear to auscultation       Cardiovascular hypertension, Pt. on medications and Pt. on home beta blockers + CAD  Normal cardiovascular exam Rhythm:Regular Rate:Normal   Cardiac Cath 02/2015            Prox RCA to Mid RCA lesion, 10% stenosed.            Prox LAD to Mid LAD lesion, 25% stenosed.            Prox Cx lesion, 30% stenosed.            The left ventricular systolic function is normal.  1. Nonobstructive CAD. No significant change since 2014. 2. Normal LV function. - Right atrium: The atrium was moderately dilated.   Neuro/Psych negative neurological ROS  negative psych ROS   GI/Hepatic negative GI ROS, (+) Cirrhosis  ([facility])      ,   Endo/Other  diabetes, Type 2, Oral Hypoglycemic Agents, Insulin DependentMorbid obesity  Renal/GU negative Renal ROS  negative genitourinary   Musculoskeletal negative musculoskeletal ROS (+)   Abdominal   Peds negative pediatric ROS (+)  Hematology negative hematology ROS (+) Thrombocytopenia plt 131 on 06/30/16   Anesthesia Other Findings   Reproductive/Obstetrics negative OB ROS                           Anesthesia Physical Anesthesia Plan  ASA: III  Anesthesia Plan: Spinal   Post-op Pain Management:  Regional for Post-op pain   Induction:   Airway Management Planned: Simple Face Mask  Additional Equipment:   Intra-op Plan:   Post-operative Plan:   Informed Consent: I have reviewed the patients History and Physical, chart, labs and discussed the procedure including the risks, benefits  and alternatives for the proposed anesthesia with the patient or authorized representative who has indicated his/her understanding and acceptance.   Dental advisory given  Plan Discussed with: CRNA  Anesthesia Plan Comments:         Anesthesia Quick Evaluation

## 2016-07-12 ENCOUNTER — Encounter (HOSPITAL_COMMUNITY): Payer: Self-pay | Admitting: Physician Assistant

## 2016-07-12 DIAGNOSIS — R739 Hyperglycemia, unspecified: Secondary | ICD-10-CM

## 2016-07-12 HISTORY — DX: Hyperglycemia, unspecified: R73.9

## 2016-07-12 LAB — GLUCOSE, CAPILLARY
GLUCOSE-CAPILLARY: 218 mg/dL — AB (ref 65–99)
GLUCOSE-CAPILLARY: 275 mg/dL — AB (ref 65–99)
Glucose-Capillary: 214 mg/dL — ABNORMAL HIGH (ref 65–99)
Glucose-Capillary: 271 mg/dL — ABNORMAL HIGH (ref 65–99)

## 2016-07-12 LAB — BASIC METABOLIC PANEL
ANION GAP: 7 (ref 5–15)
BUN: 16 mg/dL (ref 6–20)
CO2: 24 mmol/L (ref 22–32)
Calcium: 8.4 mg/dL — ABNORMAL LOW (ref 8.9–10.3)
Chloride: 103 mmol/L (ref 101–111)
Creatinine, Ser: 0.87 mg/dL (ref 0.61–1.24)
Glucose, Bld: 239 mg/dL — ABNORMAL HIGH (ref 65–99)
Potassium: 4 mmol/L (ref 3.5–5.1)
SODIUM: 134 mmol/L — AB (ref 135–145)

## 2016-07-12 LAB — CBC
HCT: 37.2 % — ABNORMAL LOW (ref 39.0–52.0)
Hemoglobin: 12.3 g/dL — ABNORMAL LOW (ref 13.0–17.0)
MCH: 27.5 pg (ref 26.0–34.0)
MCHC: 33.1 g/dL (ref 30.0–36.0)
MCV: 83 fL (ref 78.0–100.0)
PLATELETS: 155 10*3/uL (ref 150–400)
RBC: 4.48 MIL/uL (ref 4.22–5.81)
RDW: 14.1 % (ref 11.5–15.5)
WBC: 10.7 10*3/uL — ABNORMAL HIGH (ref 4.0–10.5)

## 2016-07-12 LAB — HEMOGLOBIN A1C
Hgb A1c MFr Bld: 6.9 % — ABNORMAL HIGH (ref 4.8–5.6)
MEAN PLASMA GLUCOSE: 151 mg/dL

## 2016-07-12 MED ORDER — INSULIN GLARGINE 100 UNIT/ML ~~LOC~~ SOLN
40.0000 [IU] | Freq: Every day | SUBCUTANEOUS | Status: DC
  Administered 2016-07-12: 40 [IU] via SUBCUTANEOUS
  Filled 2016-07-12: qty 0.4

## 2016-07-12 NOTE — Progress Notes (Signed)
Physical Therapy Treatment Patient Details Name: Chad [name] MRN: [id number] DOB: [birth year] Today's Date: 07/12/2016    History of Present Illness Pt is 61 y/o male s/p elective L TKA secondary to L knee OA. PMH includes DM, obesity, HTN, 1st degree AV block, CHF, liver cirrhosis, L shoulder surgery and R TKA in 2017.     PT Comments    Pt progressing extremely well and can achieve 90 deg of active L knee flexion. Pt able to complete stair negotiation for safe d/c home once medically stable.   Follow Up Recommendations  DC plan and follow up therapy as arranged by surgeon;Supervision/Assistance - 24 hour     Equipment Recommendations  None recommended by PT    Recommendations for Other Services       Precautions / Restrictions Precautions Precautions: Knee Precaution Booklet Issued: Yes (comment) Precaution Comments: pt able to recall Restrictions Weight Bearing Restrictions: Yes LLE Weight Bearing: Weight bearing as tolerated    Mobility  Bed Mobility Overal bed mobility: Modified Independent Bed Mobility: Supine to Sit;Sit to Supine     Supine to sit: Modified independent (Device/Increase time) Sit to supine: Supervision   General bed mobility comments: pt able to get in/out of bed without assist  Transfers Overall transfer level: Needs assistance Equipment used: Rolling walker (2 wheeled) Transfers: Sit to/from Stand Sit to Stand: Supervision         General transfer comment: pt with good technique  Ambulation/Gait Ambulation/Gait assistance: Supervision Ambulation Distance (Feet): 600 Feet Assistive device: Rolling walker (2 wheeled) Gait Pattern/deviations: Step-through pattern;Decreased stride length Gait velocity: dec Gait velocity interpretation: Below normal speed for age/gender General Gait Details: pt with improved gait kinematics since AM. encouraged L knee flexion during swing phase   Stairs            Wheelchair Mobility     Modified Rankin (Stroke Patients Only)       Balance Overall balance assessment: No apparent balance deficits (not formally assessed)                                          Cognition Arousal/Alertness: Awake/alert Behavior During Therapy: WFL for tasks assessed/performed Overall Cognitive Status: Within Functional Limits for tasks assessed                                        Exercises Total Joint Exercises Towel Squeeze: AROM;Left;10 reps;Supine Heel Slides: AROM;Left;10 reps;Seated Long Arc Quad: AROM;Left;10 reps;Seated Goniometric ROM: 5-90 deg in supine active knee flexion    General Comments        Pertinent Vitals/Pain Pain Assessment: 0-10 Pain Score: 5  Pain Location: L knee Pain Descriptors / Indicators: Aching Pain Intervention(s): Monitored during session    Home Living                      Prior Function            PT Goals (current goals can now be found in the care plan section) Acute Rehab PT Goals Patient Stated Goal: go home tomorrow Progress towards PT goals: Progressing toward goals    Frequency    7X/week      PT Plan Current plan remains appropriate    Co-evaluation  AM-PAC PT "6 Clicks" Daily Activity  Outcome Measure  Difficulty turning over in bed (including adjusting bedclothes, sheets and blankets)?: None Difficulty moving from lying on back to sitting on the side of the bed? : None Difficulty sitting down on and standing up from a chair with arms (e.g., wheelchair, bedside commode, etc,.)?: None Help needed moving to and from a bed to chair (including a wheelchair)?: A Little Help needed walking in hospital room?: A Little Help needed climbing 3-5 steps with a railing? : A Little 6 Click Score: 21    End of Session Equipment Utilized During Treatment: Gait belt Activity Tolerance: Patient tolerated treatment well Patient left: in chair;with call  bell/phone within reach;with family/visitor present Nurse Communication: Mobility status PT Visit Diagnosis: Other abnormalities of gait and mobility (R26.89);Pain Pain - Right/Left: Left Pain - part of body: Knee     Time: 5974-1638 PT Time Calculation (min) (ACUTE ONLY): 26 min  Charges:  $Gait Training: 8-22 mins $Therapeutic Exercise: 8-22 mins                    G Codes:      Kittie Plater, PT, DPT Pager #: [phone] Office #: [phone]    [place] 07/12/2016, 4:52 PM

## 2016-07-12 NOTE — Anesthesia Postprocedure Evaluation (Signed)
Anesthesia Post Note  Patient: Chad [name]  Procedure(s) Performed: Procedure(s) (LRB): TOTAL KNEE ARTHROPLASTY LEFT (Left)  Patient location during evaluation: PACU Anesthesia Type: Spinal Level of consciousness: awake and alert Pain management: pain level controlled Vital Signs Assessment: post-procedure vital signs reviewed and stable Respiratory status: spontaneous breathing and respiratory function stable Cardiovascular status: blood pressure returned to baseline and stable Postop Assessment: no headache, no backache and spinal receding Anesthetic complications: no       Last Vitals:  Vitals:   07/11/16 1311 07/11/16 2144  BP: (!) 144/60 (!) 124/51  Pulse: 78 87  Resp: 12 16  Temp: 36.8 C 37.1 C    Last Pain:  Vitals:   07/12/16 0317  TempSrc:   PainSc: Asleep                 Montez Hageman

## 2016-07-12 NOTE — Evaluation (Signed)
Occupational Therapy Evaluation and Discharge Summary Patient Details Name: Chad [name] MRN: [id number] DOB: [birth year] Today's Date: 07/12/2016    History of Present Illness Pt is 61 y/o male s/p elective L TKA secondary to L knee OA. PMH includes DM, obesity, HTN, 1st degree AV block, CHF, liver cirrhosis, L shoulder surgery and R TKA in 2017.    Clinical Impression   Pt admitted with the above diagnosis and overall is doing very well with adls. Pt has had this surgery before and is very familiar with adl techniques and functional mobility.  Pt has 24 hour S at d/c and is not in need of further OT services at this time.      Follow Up Recommendations  No OT follow up;DC plan and follow up therapy as arranged by surgeon    Equipment Recommendations  None recommended by OT    Recommendations for Other Services       Precautions / Restrictions Precautions Precautions: Knee Precaution Booklet Issued: Yes (comment) Precaution Comments: pt able to recall Restrictions Weight Bearing Restrictions: Yes LLE Weight Bearing: Weight bearing as tolerated      Mobility Bed Mobility               General bed mobility comments: Pt in chair on arrival.  Transfers Overall transfer level: Needs assistance Equipment used: Rolling walker (2 wheeled) Transfers: Sit to/from Stand Sit to Stand: Supervision         General transfer comment: pt with good technique for pushing up from arm rests    Balance Overall balance assessment: No apparent balance deficits (not formally assessed) Sitting-balance support: No upper extremity supported;Feet supported Sitting balance-Leahy Scale: Good     Standing balance support: Bilateral upper extremity supported;During functional activity Standing balance-Leahy Scale: Fair Standing balance comment: Pt must have RW but is able to let go of walker to groom at sink and to manage clothing in standing.                           ADL  either performed or assessed with clinical judgement   ADL Overall ADL's : Needs assistance/impaired Eating/Feeding: Independent;Sitting   Grooming: Supervision/safety;Oral care;Wash/dry face;Wash/dry hands;Standing   Upper Body Bathing: Set up;Sitting   Lower Body Bathing: Supervison/ safety;Sit to/from stand   Upper Body Dressing : Set up;Sitting   Lower Body Dressing: Supervision/safety;Sit to/from stand   Toilet Transfer: Supervision/safety;Ambulation;Comfort height toilet;BSC;Grab bars;RW   Toileting- Clothing Manipulation and Hygiene: Supervision/safety;Sit to/from stand   Tub/ Shower Transfer: Supervision/safety;Walk-in shower;Ambulation;Rolling walker   Functional mobility during ADLs: Supervision/safety;Rolling walker General ADL Comments: This is pt's second total knee surgery and pt is very comfortable with the adl techniques and routines.     Vision         Perception     Praxis      Pertinent Vitals/Pain Pain Assessment: 0-10 Pain Score: 3  Pain Location: L knee Pain Descriptors / Indicators: Aching Pain Intervention(s): Limited activity within patient's tolerance;Premedicated before session;Ice applied;Repositioned     Hand Dominance Right   Extremity/Trunk Assessment Upper Extremity Assessment Upper Extremity Assessment: Overall WFL for tasks assessed   Lower Extremity Assessment Lower Extremity Assessment: Defer to PT evaluation   Cervical / Trunk Assessment Cervical / Trunk Assessment: Normal   Communication Communication Communication: No difficulties   Cognition Arousal/Alertness: Awake/alert Behavior During Therapy: WFL for tasks assessed/performed Overall Cognitive Status: Within Functional Limits for tasks assessed  General Comments  Pt doing very well with adls and is familiar from old TKR surgery    Exercises Exercises: Total Joint Total Joint Exercises Heel Slides:  AROM;Left;10 reps;Seated (used towel to slide along floor) Long Arc Quad: AROM;Left;10 reps;Seated (with 5 sec hold) Marching in Standing: AROM;Left;10 reps;Supine   Shoulder Instructions      Home Living Family/patient expects to be discharged to:: Private residence Living Arrangements: Spouse/significant other Available Help at Discharge: Family;Available 24 hours/day Type of Home: Mobile home Home Access: Stairs to enter Entrance Stairs-Number of Steps: 4 Entrance Stairs-Rails: Right;Left;Can reach both Home Layout: One level     Bathroom Shower/Tub: Occupational psychologist: Handicapped height     Home Equipment: Environmental consultant - 2 wheels;Bedside commode;Cane - single point          Prior Functioning/Environment Level of Independence: Independent                 OT Problem List:        OT Treatment/Interventions:      OT Goals(Current goals can be found in the care plan section) Acute Rehab OT Goals Patient Stated Goal: get back to walking to loose weight OT Goal Formulation: All assessment and education complete, DC therapy  OT Frequency:     Barriers to D/C:            Co-evaluation              AM-PAC PT "6 Clicks" Daily Activity     Outcome Measure Help from another person eating meals?: None Help from another person taking care of personal grooming?: None Help from another person toileting, which includes using toliet, bedpan, or urinal?: None Help from another person bathing (including washing, rinsing, drying)?: A Little Help from another person to put on and taking off regular upper body clothing?: None Help from another person to put on and taking off regular lower body clothing?: A Little 6 Click Score: 22   End of Session Equipment Utilized During Treatment: Rolling walker CPM Left Knee CPM Left Knee: Off Nurse Communication: Mobility status  Activity Tolerance: Patient tolerated treatment well Patient left: in chair;with call  bell/phone within reach;with family/visitor present  OT Visit Diagnosis: Unsteadiness on feet (R26.81)                Time: 0737-1062 OT Time Calculation (min): 13 min Charges:  OT General Charges $OT Visit: 1 Procedure OT Evaluation $OT Eval Low Complexity: 1 Procedure G-Codes:     Jinger Neighbors, OTR/L [phone]  Glenford Peers 07/12/2016, 9:46 AM

## 2016-07-12 NOTE — Progress Notes (Signed)
Physical Therapy Treatment Patient Details Name: Chad [name] MRN: [id number] DOB: June 08, 1955 Today's Date: 07/12/2016    History of Present Illness Pt is 61 y/o male s/p elective L TKA secondary to L knee OA. PMH includes DM, obesity, HTN, 1st degree AV block, CHF, liver cirrhosis, L shoulder surgery and R TKA in 2017.     PT Comments    Pt progressing well towards all goals. Pt able to complete 85 deg of active L knee flexion in sitting. Pt with improved gait kinematics and ambulation tolerance. Acute PT to con't to follow.    Follow Up Recommendations  DC plan and follow up therapy as arranged by surgeon;Supervision/Assistance - 24 hour     Equipment Recommendations  None recommended by PT    Recommendations for Other Services       Precautions / Restrictions Precautions Precautions: Knee Precaution Booklet Issued: Yes (comment) Precaution Comments: pt able to recall Restrictions Weight Bearing Restrictions: Yes LLE Weight Bearing: Weight bearing as tolerated    Mobility  Bed Mobility               General bed mobility comments: pt up in chair upon PT arrival  Transfers Overall transfer level: Needs assistance Equipment used: Rolling walker (2 wheeled) Transfers: Sit to/from Stand Sit to Stand: Min guard         General transfer comment: pt with good technique for pushing up from arm rests  Ambulation/Gait Ambulation/Gait assistance: Min guard Ambulation Distance (Feet): 550 Feet Assistive device: Rolling walker (2 wheeled) Gait Pattern/deviations: Step-through pattern;Decreased stride length Gait velocity: Decreased Gait velocity interpretation: Below normal speed for age/gender General Gait Details: pt initially with short step length with R LE and increased UE WBing, as amb progressed pt with improved fluidity of gait pattern and decreased UE WBing   Stairs Stairs: Yes   Stair Management: Step to pattern;Two rails Number of Stairs: 2 (x2  trials) General stair comments: pt with good technique, no knee buckling  Wheelchair Mobility    Modified Rankin (Stroke Patients Only)       Balance Overall balance assessment: No apparent balance deficits (not formally assessed)                                          Cognition Arousal/Alertness: Awake/alert Behavior During Therapy: WFL for tasks assessed/performed Overall Cognitive Status: Within Functional Limits for tasks assessed                                        Exercises Total Joint Exercises Heel Slides: AROM;Left;10 reps;Seated (used towel to slide along floor) Long Arc Quad: AROM;Left;10 reps;Seated (with 5 sec hold) Marching in Standing: AROM;Left;10 reps;Supine    General Comments        Pertinent Vitals/Pain Pain Score: 7  Pain Location: L knee Pain Descriptors / Indicators: Aching Pain Intervention(s): Monitored during session    Home Living                      Prior Function            PT Goals (current goals can now be found in the care plan section) Acute Rehab PT Goals Patient Stated Goal: get back to walking to loose weight Progress towards PT goals: Progressing toward  goals    Frequency    7X/week      PT Plan Current plan remains appropriate    Co-evaluation              AM-PAC PT "6 Clicks" Daily Activity  Outcome Measure  Difficulty turning over in bed (including adjusting bedclothes, sheets and blankets)?: A Little Difficulty moving from lying on back to sitting on the side of the bed? : A Little Difficulty sitting down on and standing up from a chair with arms (e.g., wheelchair, bedside commode, etc,.)?: None Help needed moving to and from a bed to chair (including a wheelchair)?: A Little Help needed walking in hospital room?: A Little Help needed climbing 3-5 steps with a railing? : A Little 6 Click Score: 19    End of Session Equipment Utilized During Treatment:  Gait belt Activity Tolerance: Patient tolerated treatment well Patient left: in chair;with call bell/phone within reach;with family/visitor present Nurse Communication: Mobility status PT Visit Diagnosis: Other abnormalities of gait and mobility (R26.89);Pain Pain - Right/Left: Left Pain - part of body: Knee     Time: 7782-4235 PT Time Calculation (min) (ACUTE ONLY): 20 min  Charges:  $Gait Training: 8-22 mins                    G Codes:       Kittie Plater, PT, DPT Pager #: [phone] Office #: [phone]   [place] 07/12/2016, 9:13 AM

## 2016-07-12 NOTE — Progress Notes (Signed)
Subjective: 1 Day Post-Op Procedure(s) (LRB): TOTAL KNEE ARTHROPLASTY LEFT (Left) Patient reports pain as 4 on 0-10 scale.    Objective: Vital signs in last 24 hours: Temp:  [97.7 F (36.5 C)-98.9 F (37.2 C)] 98.9 F (37.2 C) (05/22 0500) Pulse Rate:  [75-87] 80 (05/22 0500) Resp:  [10-17] 16 (05/21 2144) BP: (120-144)/(51-71) 120/58 (05/22 0500) SpO2:  [94 %-100 %] 96 % (05/22 0500)  Intake/Output from previous day: 05/21 0701 - 05/22 0700 In: 2110 [P.O.:490; I.V.:1410; IV Piggyback:210] Out: 2605 [Urine:2575; Blood:30] Intake/Output this shift: No intake/output data recorded.  No results for input(s): HGB in the last 72 hours. No results for input(s): WBC, RBC, HCT, PLT in the last 72 hours. No results for input(s): NA, K, CL, CO2, BUN, CREATININE, GLUCOSE, CALCIUM in the last 72 hours. No results for input(s): LABPT, INR in the last 72 hours.  ABD soft Neurovascular intact Sensation intact distally Intact pulses distally Dorsiflexion/Plantar flexion intact Incision: dressing C/D/I  Assessment/Plan: 1 Day Post-Op Procedure(s) (LRB): TOTAL KNEE ARTHROPLASTY LEFT (Left)  Principal Problem:   Primary localized osteoarthritis of left knee Active Problems:   Type 2 diabetes mellitus, uncontrolled, with retinopathy (HCC)   HTN (hypertension)   HLD (hyperlipidemia)   GERD (gastroesophageal reflux disease)   Seasonal allergies   Irregular heart beat   OSA (obstructive sleep apnea)   Obesity, Class III, BMI 40-49.9 (morbid obesity) (HCC)   CAD (coronary artery disease)   Primary osteoarthritis of right knee   Diastolic CHF, chronic (HCC)   Liver cirrhosis secondary to NASH (HCC)   Thrombocytopenia (HCC)   Diabetic neuropathy associated with type 2 diabetes mellitus (HCC)   Hyperglycemia glucose over 300 in last 24 hrs  Will increase Lantus to 40 units qhs due to significant hyperglycemia.  Plan on D/c tomorrow if sugars decline Advance diet Up with therapy D/C IV  fluids Plan for discharge tomorrow Discharge home with home health  Linda Hedges 07/12/2016, 8:17 AM

## 2016-07-13 ENCOUNTER — Other Ambulatory Visit: Payer: Self-pay | Admitting: Family Medicine

## 2016-07-13 LAB — BASIC METABOLIC PANEL
Anion gap: 8 (ref 5–15)
BUN: 19 mg/dL (ref 6–20)
CO2: 25 mmol/L (ref 22–32)
Calcium: 8.8 mg/dL — ABNORMAL LOW (ref 8.9–10.3)
Chloride: 103 mmol/L (ref 101–111)
Creatinine, Ser: 0.9 mg/dL (ref 0.61–1.24)
GFR calc non Af Amer: >60 mL/min (ref >60)
Glucose, Bld: 203 mg/dL — ABNORMAL HIGH (ref 65–99)
Potassium: 4.3 mmol/L (ref 3.5–5.1)
Sodium: 136 mmol/L (ref 135–145)

## 2016-07-13 LAB — CBC
HEMATOCRIT: 37.8 % — AB (ref 39.0–52.0)
Hemoglobin: 12.2 g/dL — ABNORMAL LOW (ref 13.0–17.0)
MCH: 26.8 pg (ref 26.0–34.0)
MCHC: 32.3 g/dL (ref 30.0–36.0)
MCV: 83.1 fL (ref 78.0–100.0)
Platelets: 173 10*3/uL (ref 150–400)
RBC: 4.55 MIL/uL (ref 4.22–5.81)
RDW: 14.3 % (ref 11.5–15.5)
WBC: 11.2 10*3/uL — AB (ref 4.0–10.5)

## 2016-07-13 LAB — GLUCOSE, CAPILLARY
GLUCOSE-CAPILLARY: 189 mg/dL — AB (ref 65–99)
Glucose-Capillary: 214 mg/dL — ABNORMAL HIGH (ref 65–99)

## 2016-07-13 MED ORDER — ACETAMINOPHEN 325 MG PO TABS: 650.0000 mg | ORAL_TABLET | Freq: Four times a day (QID) | ORAL | Status: DC | PRN

## 2016-07-13 MED ORDER — HYDROMORPHONE HCL 2 MG PO TABS: ORAL_TABLET | ORAL | 0 refills | Status: DC

## 2016-07-13 MED ORDER — DSS 100 MG PO CAPS: ORAL_CAPSULE | ORAL | 0 refills | Status: DC

## 2016-07-13 MED ORDER — ASPIRIN 325 MG PO TBEC: DELAYED_RELEASE_TABLET | ORAL | 0 refills | Status: DC

## 2016-07-13 MED ORDER — POLYETHYLENE GLYCOL 3350 17 G PO PACK: PACK | ORAL | 0 refills | Status: DC

## 2016-07-13 NOTE — Care Management Note (Signed)
Case Management Note  Patient Details  Name: GAILEN VENNE MRN: [id number] Date of Birth: 1955-09-09  Subjective/Objective: 61 yr old male admitted with ostearthritis of the left knee, patient underwent a left total knee arthroplasty.                   Action/Plan:  Patient was preoperatively setup with [facility], no changes. DME has been delivered to his home. He will have support at discharge.   Expected Discharge Date:  07/13/16               Expected Discharge Plan:  [place]  In-House Referral:     Discharge planning Services  CM Consult  Post Acute Care Choice:  Durable Medical Equipment, Home Health Choice offered to:  Patient  DME Arranged:  3-N-1, Walker rolling, CPM DME Agency:  [employer]  HH Arranged:  PT [place]:  [facility] [employer]  Status of Service:  Completed, signed off  If discussed at [employer] [facility] [employer], dates discussed:    Additional Comments:  Ninfa Meeker, RN 07/13/2016, 1:31 PM

## 2016-07-13 NOTE — Progress Notes (Signed)
Patient discharge instructions reviewed with patient and family. Patient verbalizes understanding. Patient belongings with family. Patient discharged via wheelchair. [name] is transporting him home. Patient is not in distress.

## 2016-07-13 NOTE — Progress Notes (Signed)
Physical Therapy Treatment Patient Details Name: Chad [name] MRN: [id number] DOB: [birth year] Today's Date: 07/13/2016    History of Present Illness Pt is 61 y/o male s/p elective L TKA secondary to L knee OA. PMH includes DM, obesity, HTN, 1st degree AV block, CHF, liver cirrhosis, L shoulder surgery and R TKA in 2017.     PT Comments    Pt progressing very well and functioning at supervision level. Pt safe for d/c home with spouse and HHPT once medically stable.   Follow Up Recommendations  DC plan and follow up therapy as arranged by surgeon;Supervision/Assistance - 24 hour     Equipment Recommendations  None recommended by PT    Recommendations for Other Services       Precautions / Restrictions Precautions Precautions: Knee Precaution Booklet Issued: Yes (comment) Precaution Comments: pt able to recall Restrictions Weight Bearing Restrictions: Yes LLE Weight Bearing: Weight bearing as tolerated    Mobility  Bed Mobility Overal bed mobility: Modified Independent Bed Mobility: Supine to Sit;Sit to Supine     Supine to sit: Modified independent (Device/Increase time) Sit to supine: Modified independent (Device/Increase time)   General bed mobility comments: pt with no difficulty  Transfers Overall transfer level: Modified independent Equipment used: Rolling walker (2 wheeled) Transfers: Sit to/from Stand Sit to Stand: Modified independent (Device/Increase time)         General transfer comment: pt with good technique/use of hands  Ambulation/Gait Ambulation/Gait assistance: Supervision Ambulation Distance (Feet): 600 Feet Assistive device: Rolling walker (2 wheeled) Gait Pattern/deviations: Step-through pattern Gait velocity: improved from yesterday Gait velocity interpretation: Below normal speed for age/gender General Gait Details: pt with improved kinematics/fluidity. pt with increased L hip/knee flexion during swing phase.v/c's to increase step  length   Stairs Stairs: Yes   Stair Management: Two rails;Step to pattern Number of Stairs: 6 General stair comments: pt with good technique and understanding of sequencing  Wheelchair Mobility    Modified Rankin (Stroke Patients Only)       Balance Overall balance assessment: No apparent balance deficits (not formally assessed)                                          Cognition Arousal/Alertness: Awake/alert Behavior During Therapy: WFL for tasks assessed/performed Overall Cognitive Status: Within Functional Limits for tasks assessed                                        Exercises Total Joint Exercises Quad Sets: AROM;Left;10 reps;Supine Short Arc Quad: AROM;Left;10 reps;Supine Heel Slides: AROM;Left;10 reps;Supine (with passive stretch at end) Long Arc Quad: AROM;Left;10 reps;Seated Goniometric ROM: 10- 95 deg active knee flexion in supine    General Comments General comments (skin integrity, edema, etc.): pt given ice s/p therapy instructed 20 min on /20 min off      Pertinent Vitals/Pain Pain Assessment: 0-10 Pain Score: 5  Pain Location: L knee Pain Descriptors / Indicators: Sore Pain Intervention(s): Monitored during session    Home Living                      Prior Function            PT Goals (current goals can now be found in the care plan section) Acute Rehab PT Goals  Patient Stated Goal: go home today Progress towards PT goals: Progressing toward goals    Frequency    7X/week      PT Plan Current plan remains appropriate    Co-evaluation              AM-PAC PT "6 Clicks" Daily Activity  Outcome Measure  Difficulty turning over in bed (including adjusting bedclothes, sheets and blankets)?: None Difficulty moving from lying on back to sitting on the side of the bed? : None Difficulty sitting down on and standing up from a chair with arms (e.g., wheelchair, bedside commode, etc,.)?:  None Help needed moving to and from a bed to chair (including a wheelchair)?: None Help needed walking in hospital room?: None Help needed climbing 3-5 steps with a railing? : None 6 Click Score: 24    End of Session Equipment Utilized During Treatment: Gait belt Activity Tolerance: Patient tolerated treatment well Patient left: in chair;with call bell/phone within reach;with family/visitor present Nurse Communication: Mobility status PT Visit Diagnosis: Other abnormalities of gait and mobility (R26.89);Pain Pain - Right/Left: Left Pain - part of body: Knee     Time: 4932-4199 PT Time Calculation (min) (ACUTE ONLY): 28 min  Charges:  $Gait Training: 8-22 mins $Therapeutic Exercise: 8-22 mins                    G Codes:       Kittie Plater, PT, DPT Pager #: [phone] Office #: [phone]    [place] 07/13/2016, 9:17 AM

## 2016-07-13 NOTE — Discharge Summary (Signed)
Patient ID: Chad [name] MRN: [id number] DOB/AGE: November 04, 1955 61 y.o.  Admit date: 07/11/2016 Discharge date: 07/13/2016  Admission Diagnoses:  Principal Problem:   Primary localized osteoarthritis of left knee Active Problems:   Type 2 diabetes mellitus, uncontrolled, with retinopathy (HCC)   HTN (hypertension)   HLD (hyperlipidemia)   GERD (gastroesophageal reflux disease)   Seasonal allergies   Irregular heart beat   OSA (obstructive sleep apnea)   Obesity, Class III, BMI 40-49.9 (morbid obesity) (HCC)   CAD (coronary artery disease)   Primary osteoarthritis of right knee   Diastolic CHF, chronic (HCC)   Liver cirrhosis secondary to NASH (HCC)   Thrombocytopenia (HCC)   Diabetic neuropathy associated with type 2 diabetes mellitus ([place])   Hyperglycemia glucose over 300 in last 24 hrs   Discharge Diagnoses:  Same  Past Medical History:  Diagnosis Date  . Abnormal drug screen    innaprop negative for hydrocodone 09/2013, inapprop negative for hydrocodone and tramadol 02/2014; inappropr negative hydrocodone 03/2015  . Acute diverticulitis 08/15/2014  . Arthritis    "both hips and knees; got shots in each hip in August" (01/25/2013)  . Bone spur    L4 L5  . Bulging lumbar disc   . Diabetes mellitus without complication ([place])    no medicarions in over 2 years,wt. loss 100 lbs  . Diastolic CHF, chronic ([place]) 04/02/2012  . Diverticulosis   . Gastric bypass status for obesity 1985  . Gastritis 08/31/2015   with focal intestinal metaplasia  . GERD (gastroesophageal reflux disease)    severe, h/o gastritis and GI bleed, per pt normal EGD at [facility] 2008  . History of diabetes mellitus 1990s   with mild background retinopathy, resolved with weight loss  . HLD (hyperlipidemia)    statin caused leg cramps  . HTN (hypertension)   . Hyperglycemia glucose over 300 in last 24 hrs 07/12/2016  . Hyperplastic colon polyp 2008  . Hypothyroid   . Internal hemorrhoids   . Morbid obesity ([place])    . Narrowing of lumbar spine   . OSA (obstructive sleep apnea)    unable to use CPAP as of last try 2/2 h/o tracheostomy?  . Otomycosis of right ear 07/06/2011  . Primary localized osteoarthritis of left knee 06/29/2016  . PVC (premature ventricular contraction)    RBBB Infer axis  . Right ear pain    s/p eval by ENT - thought TMJ referred pain and sent to oral surg for dental splint  . Seasonal allergies   . Sensorineural hearing loss, bilateral    hearing aides  . Splenomegaly   . Thrombocytopenia ([place]) 06/10/2015  . Tinnitus    due to sensorineural hearing loss R>L with ETD  . Trifascicular block  RBBB/LPFB/1AVB     Surgeries: Procedure(s): TOTAL KNEE ARTHROPLASTY LEFT on 07/11/2016   Consultants:   Discharged Condition: Improved  Hospital Course: EMMIT ORILEY is an 61 y.o. male who was admitted 07/11/2016 for operative treatment ofPrimary localized osteoarthritis of left knee. Patient has severe unremitting pain that affects sleep, daily activities, and work/hobbies. After pre-op clearance the patient was taken to the operating room on 07/11/2016 and underwent  Procedure(s): TOTAL KNEE ARTHROPLASTY LEFT.    Patient was given perioperative antibiotics: Anti-infectives    Start     Dose/Rate Route Frequency Ordered Stop   07/11/16 1530  ceFAZolin (ANCEF) IVPB 2g/100 mL premix     2 g 200 mL/hr over 30 Minutes Intravenous Every 6 hours 07/11/16 1321 07/11/16 2218  07/11/16 0845  ceFAZolin (ANCEF) 3 g in dextrose 5 % 50 mL IVPB     3 g 130 mL/hr over 30 Minutes Intravenous On call to O.R. 07/08/16 1236 07/11/16 0941       Patient was given sequential compression devices, early ambulation, and chemoprophylaxis to prevent DVT.  Patient benefited maximally from hospital stay and there were no complications.    Recent vital signs: Patient Vitals for the past 24 hrs:  BP Temp Temp src Pulse Resp SpO2  07/13/16 0550 (!) 174/74 97.7 F (36.5 C) Oral 76 18 100 %  07/12/16 2118  (!) 157/65 98 F (36.7 C) Oral 87 18 98 %  07/12/16 1503 (!) 150/66 98.2 F (36.8 C) Oral 65 - 100 %     Recent laboratory studies:  Recent Labs  07/12/16 0725  WBC 10.7*  HGB 12.3*  HCT 37.2*  PLT 155  NA 134*  K 4.0  CL 103  CO2 24  BUN 16  CREATININE 0.87  GLUCOSE 239*  CALCIUM 8.4*     Discharge Medications:   Allergies as of 07/13/2016      Reactions   Nsaids Palpitations, Other (See Comments)   ACID REFLUX   Statins Other (See Comments)   MYALGIAS, LEG CRAMPS   Librax [chlordiazepoxide-clidinium] Other (See Comments)   Urinary retention    Tessalon [benzonatate] Other (See Comments)   Unable to swallow due to GERD - caused throat numbness   Codeine Nausea Only   Sulfa Drugs Cross Reactors Nausea Only      Medication List    STOP taking these medications   aspirin 81 MG tablet Replaced by:  aspirin 325 MG EC tablet   HYDROcodone-acetaminophen 5-325 MG tablet Commonly known as:  NORCO/VICODIN   traMADol 50 MG tablet Commonly known as:  ULTRAM     TAKE these medications   acetaminophen 325 MG tablet Commonly known as:  TYLENOL Take 2 tablets (650 mg total) by mouth every 6 (six) hours as needed for mild pain (or Fever >/= 101).   aspirin 325 MG EC tablet 1 tab a day for the next 30 days to prevent blood clots Replaces:  aspirin 81 MG tablet   BASAGLAR KWIKPEN 100 UNIT/ML Sopn Inject 13 Units into the skin at bedtime.   DSS 100 MG Caps 1 tab 2 times a day while on narcotics.  STOOL SOFTENER What changed:  how much to take  how to take this  when to take this  additional instructions   fluticasone 50 MCG/ACT nasal spray Commonly known as:  FLONASE PLACE 2 SPRAYS INTO BOTH NOSTRILS DAILY   furosemide 20 MG tablet Commonly known as:  LASIX Take 20 mg by mouth every Monday, Wednesday, and Friday.   glimepiride 2 MG tablet Commonly known as:  AMARYL Take 2 tablets (4 mg total) by mouth daily with breakfast.   HYDROmorphone 2 MG  tablet Commonly known as:  DILAUDID 1-2 tab q 4-6 hrs prn pain   ibuprofen 200 MG tablet Commonly known as:  ADVIL,MOTRIN Take 200 mg by mouth at bedtime.   Insulin Pen Needle 31G X 6 MM Misc Use as directed for lantus solostar pen   levothyroxine 112 MCG tablet Commonly known as:  SYNTHROID, LEVOTHROID Take 1 tablet (112 mcg total) by mouth daily.   lisinopril 20 MG tablet Commonly known as:  PRINIVIL,ZESTRIL Take 1 tablet (20 mg total) by mouth daily.   metFORMIN 1000 MG tablet Commonly known as:  GLUCOPHAGE TAKE 1  TABLET BY MOUTH 2 TIMES DAILY WITH A MEAL   metoCLOPramide 10 MG tablet Commonly known as:  REGLAN TAKE 1 TABLET BY MOUTH 3 TIMES DAILY BEFORE MEALS What changed:  See the new instructions.   metoprolol tartrate 25 MG tablet Commonly known as:  LOPRESSOR TAKE 1 TABLET BY MOUTH TWICE A DAY   multivitamin with minerals Tabs tablet Take 1 tablet by mouth daily.   NEXIUM 40 MG capsule Generic drug:  esomeprazole TAKE 1 CAPSULE BY MOUTH TWICE DAILY   nitroGLYCERIN 0.4 MG SL tablet Commonly known as:  NITROSTAT Place 1 tablet (0.4 mg total) under the tongue every 5 (five) minutes as needed for chest pain.   polyethylene glycol packet Commonly known as:  MIRALAX / GLYCOLAX 17grams in 16 oz of water twice a day until bowel movement.  LAXITIVE.  Restart if two days since last bowel movement   potassium chloride 10 MEQ tablet Commonly known as:  K-DUR Take 10 mEq by mouth every Monday, Wednesday, and Friday.   Red Yeast Rice 600 MG Caps Take 1 capsule (600 mg total) by mouth 2 (two) times daily.   VENTOLIN HFA 108 (90 Base) MCG/ACT inhaler Generic drug:  albuterol Inhale 1 puff into the lungs every 6 (six) hours as needed for wheezing or shortness of breath.   vitamin E 400 UNIT capsule Take 400 Units by mouth daily.       Diagnostic Studies: No results found.  Disposition: 01-Home or Self Care  Discharge Instructions    CPM    Complete by:  As  directed    Continuous passive motion machine (CPM):      Use the CPM from 0 to 90 for 6 hours per day.       You may break it up into 2 or 3 sessions per day.      Use CPM for 2 weeks or until you are told to stop.   Call MD / Call 911    Complete by:  As directed    If you experience chest pain or shortness of breath, CALL 911 and be transported to the hospital emergency room.  If you develope a fever above 101 F, pus (white drainage) or increased drainage or redness at the wound, or calf pain, call your surgeon's office.   Change dressing    Complete by:  As directed    DO NOT REMOVE BANDAGE OVER SURGICAL INCISION.  [place] WHOLE LEG INCLUDING OVER THE WATERPROOF BANDAGE WITH SOAP AND WATER EVERY DAY.   Constipation Prevention    Complete by:  As directed    Drink plenty of fluids.  Prune juice may be helpful.  You may use a stool softener, such as Colace (over the counter) 100 mg twice a day.  Use MiraLax (over the counter) for constipation as needed.   Diet - low sodium heart healthy    Complete by:  As directed    Discharge instructions    Complete by:  As directed    INSTRUCTIONS AFTER JOINT REPLACEMENT   Remove items at home which could result in a fall. This includes throw rugs or furniture in walking pathways ICE to the affected joint every three hours while awake for 30 minutes at a time, for at least the first 3-5 days, and then as needed for pain and swelling.  Continue to use ice for pain and swelling. You may notice swelling that will progress down to the foot and ankle.  This is normal after  surgery.  Elevate your leg when you are not up walking on it.   Continue to use the breathing machine you got in the hospital (incentive spirometer) which will help keep your temperature down.  It is common for your temperature to cycle up and down following surgery, especially at night when you are not up moving around and exerting yourself.  The breathing machine keeps your lungs expanded  and your temperature down.   DIET:  As you were doing prior to hospitalization, we recommend a well-balanced diet.  DRESSING / WOUND CARE / SHOWERING  Keep the surgical dressing until follow up.  The dressing is water proof, so you can shower without any extra covering.  IF THE DRESSING FALLS OFF or the wound gets wet inside, change the dressing with sterile gauze.  Please use good hand washing techniques before changing the dressing.  Do not use any lotions or creams on the incision until instructed by your surgeon.    ACTIVITY  Increase activity slowly as tolerated, but follow the weight bearing instructions below.   No driving for 6 weeks or until further direction given by your physician.  You cannot drive while taking narcotics.  No lifting or carrying greater than 10 lbs. until further directed by your surgeon. Avoid periods of inactivity such as sitting longer than an hour when not asleep. This helps prevent blood clots.  You may return to work once you are authorized by your doctor.     WEIGHT BEARING   Weight bearing as tolerated with assist device (walker, cane, etc) as directed, use it as long as suggested by your surgeon or therapist, typically at least 2-3 weeks.   EXERCISES  Results after joint replacement surgery are often greatly improved when you follow the exercise, range of motion and muscle strengthening exercises prescribed by your doctor. Safety measures are also important to protect the joint from further injury. Any time any of these exercises cause you to have increased pain or swelling, decrease what you are doing until you are comfortable again and then slowly increase them. If you have problems or questions, call your caregiver or physical therapist for advice.   Rehabilitation is important following a joint replacement. After just a few days of immobilization, the muscles of the leg can become weakened and shrink (atrophy).  These exercises are designed to  build up the tone and strength of the thigh and leg muscles and to improve motion. Often times heat used for twenty to thirty minutes before working out will loosen up your tissues and help with improving the range of motion but do not use heat for the first two weeks following surgery (sometimes heat can increase post-operative swelling).   These exercises can be done on a training (exercise) mat, on the floor, on a table or on a bed. Use whatever works the best and is most comfortable for you.    Use music or television while you are exercising so that the exercises are a pleasant break in your day. This will make your life better with the exercises acting as a break in your routine that you can look forward to.   Perform all exercises about fifteen times, three times per day or as directed.  You should exercise both the operative leg and the other leg as well.   Exercises include:  Quad Sets - Tighten up the muscle on the front of the thigh (Quad) and hold for 5-10 seconds.   Straight Leg Raises -  With your knee straight (if you were given a brace, keep it on), lift the leg to 60 degrees, hold for 3 seconds, and slowly lower the leg.  Perform this exercise against resistance later as your leg gets stronger.  Leg Slides: Lying on your back, slowly slide your foot toward your buttocks, bending your knee up off the floor (only go as far as is comfortable). Then slowly slide your foot back down until your leg is flat on the floor again.  Angel Wings: Lying on your back spread your legs to the side as far apart as you can without causing discomfort.  Hamstring Strength:  Lying on your back, push your heel against the floor with your leg straight by tightening up the muscles of your buttocks.  Repeat, but this time bend your knee to a comfortable angle, and push your heel against the floor.  You may put a pillow under the heel to make it more comfortable if necessary.   A rehabilitation program following  joint replacement surgery can speed recovery and prevent re-injury in the future due to weakened muscles. Contact your doctor or a physical therapist for more information on knee rehabilitation.    CONSTIPATION  Constipation is defined medically as fewer than three stools per week and severe constipation as less than one stool per week.  Even if you have a regular bowel pattern at home, your normal regimen is likely to be disrupted due to multiple reasons following surgery.  Combination of anesthesia, postoperative narcotics, change in appetite and fluid intake all can affect your bowels.   YOU MUST use at least one of the following options; they are listed in order of increasing strength to get the job done.  They are all available over the counter, and you may need to use some, POSSIBLY even all of these options:    Drink plenty of fluids (prune juice may be helpful) and high fiber foods Colace 100 mg by mouth twice a day  Senokot for constipation as directed and as needed Dulcolax (bisacodyl), take with full glass of water  Miralax (polyethylene glycol) once or twice a day as needed.  If you have tried all these things and are unable to have a bowel movement in the first 3-4 days after surgery call either your surgeon or your primary doctor.    If you experience loose stools or diarrhea, hold the medications until you stool forms back up.  If your symptoms do not get better within 1 week or if they get worse, check with your doctor.  If you experience "the worst abdominal pain ever" or develop nausea or vomiting, please contact the office immediately for further recommendations for treatment.   ITCHING:  If you experience itching with your medications, try taking only a single pain pill, or even half a pain pill at a time.  You can also use Benadryl over the counter for itching or also to help with sleep.   TED HOSE STOCKINGS:  Use stockings on both legs until for at least 2 weeks or as  directed by physician office. They may be removed at night for sleeping.  MEDICATIONS:  See your medication summary on the "After Visit Summary" that nursing will review with you.  You may have some home medications which will be placed on hold until you complete the course of blood thinner medication.  It is important for you to complete the blood thinner medication as prescribed.  PRECAUTIONS:  If you experience chest  pain or shortness of breath - call 911 immediately for transfer to the hospital emergency department.   If you develop a fever greater that 101 F, purulent drainage from wound, increased redness or drainage from wound, foul odor from the wound/dressing, or calf pain - CONTACT YOUR SURGEON.                                                   FOLLOW-UP APPOINTMENTS:  If you do not already have a post-op appointment, please call the office for an appointment to be seen by your surgeon.  Guidelines for how soon to be seen are listed in your "After Visit Summary", but are typically between 1-4 weeks after surgery.  OTHER INSTRUCTIONS:   Knee Replacement:  Do not place pillow under knee, focus on keeping the knee straight while resting. CPM instructions: 0-90 degrees, 2 hours in the morning, 2 hours in the afternoon, and 2 hours in the evening. Place foam block, curve side up under heel at all times except when in CPM or when walking.  DO NOT modify, tear, cut, or change the foam block in any way.  MAKE SURE YOU:  Understand these instructions.  Get help right away if you are not doing well or get worse.    Thank you for letting us be a part of your medical care team.  It is a privilege we respect greatly.  We hope these instructions will help you stay on track for a fast and full recovery!   Do not put a pillow under the knee. Place it under the heel.    Complete by:  As directed    Place gray foam block, curve side up under heel at all times except when in CPM or when walking.  DO NOT  modify, tear, cut, or change in any way the gray foam block.   Increase activity slowly as tolerated    Complete by:  As directed    Patient may shower    Complete by:  As directed    Aquacel dressing is water proof    Wash over it and the whole leg with soap and water at the end of your shower   TED hose    Complete by:  As directed    Use stockings (TED hose) for 2 weeks on both leg(s).  You may remove them at night for sleeping.      Follow-up Information    Elsie Saas, MD Follow up on 07/25/2016.   Specialty:  Orthopedic Surgery Why:  appointment time 4:30 Contact information: [address] [place] [address] [phone]        Special[facility], [facility] Follow up on 07/25/2016.   Specialty:  Orthopedic Surgery Why:  physical therapy appointment at [facility] in at 2:30 for 3 pm appt Contact information: [place] [address] [phone]            Signed: Linda Hedges 07/13/2016, 7:10 AM

## 2016-07-14 DIAGNOSIS — M5186 Other intervertebral disc disorders, lumbar region: Secondary | ICD-10-CM | POA: Diagnosis not present

## 2016-07-14 DIAGNOSIS — E11319 Type 2 diabetes mellitus with unspecified diabetic retinopathy without macular edema: Secondary | ICD-10-CM | POA: Diagnosis not present

## 2016-07-14 DIAGNOSIS — E1165 Type 2 diabetes mellitus with hyperglycemia: Secondary | ICD-10-CM | POA: Diagnosis not present

## 2016-07-14 DIAGNOSIS — Z471 Aftercare following joint replacement surgery: Secondary | ICD-10-CM | POA: Diagnosis not present

## 2016-07-14 DIAGNOSIS — E1140 Type 2 diabetes mellitus with diabetic neuropathy, unspecified: Secondary | ICD-10-CM | POA: Diagnosis not present

## 2016-07-14 DIAGNOSIS — M160 Bilateral primary osteoarthritis of hip: Secondary | ICD-10-CM | POA: Diagnosis not present

## 2016-07-15 DIAGNOSIS — Z471 Aftercare following joint replacement surgery: Secondary | ICD-10-CM | POA: Diagnosis not present

## 2016-07-15 DIAGNOSIS — E1165 Type 2 diabetes mellitus with hyperglycemia: Secondary | ICD-10-CM | POA: Diagnosis not present

## 2016-07-15 DIAGNOSIS — E11319 Type 2 diabetes mellitus with unspecified diabetic retinopathy without macular edema: Secondary | ICD-10-CM | POA: Diagnosis not present

## 2016-07-15 DIAGNOSIS — E1140 Type 2 diabetes mellitus with diabetic neuropathy, unspecified: Secondary | ICD-10-CM | POA: Diagnosis not present

## 2016-07-15 DIAGNOSIS — M5186 Other intervertebral disc disorders, lumbar region: Secondary | ICD-10-CM | POA: Diagnosis not present

## 2016-07-15 DIAGNOSIS — M160 Bilateral primary osteoarthritis of hip: Secondary | ICD-10-CM | POA: Diagnosis not present

## 2016-07-19 DIAGNOSIS — E1165 Type 2 diabetes mellitus with hyperglycemia: Secondary | ICD-10-CM | POA: Diagnosis not present

## 2016-07-19 DIAGNOSIS — M5186 Other intervertebral disc disorders, lumbar region: Secondary | ICD-10-CM | POA: Diagnosis not present

## 2016-07-19 DIAGNOSIS — M160 Bilateral primary osteoarthritis of hip: Secondary | ICD-10-CM | POA: Diagnosis not present

## 2016-07-19 DIAGNOSIS — E11319 Type 2 diabetes mellitus with unspecified diabetic retinopathy without macular edema: Secondary | ICD-10-CM | POA: Diagnosis not present

## 2016-07-19 DIAGNOSIS — Z471 Aftercare following joint replacement surgery: Secondary | ICD-10-CM | POA: Diagnosis not present

## 2016-07-19 DIAGNOSIS — E1140 Type 2 diabetes mellitus with diabetic neuropathy, unspecified: Secondary | ICD-10-CM | POA: Diagnosis not present

## 2016-07-20 DIAGNOSIS — M5186 Other intervertebral disc disorders, lumbar region: Secondary | ICD-10-CM | POA: Diagnosis not present

## 2016-07-20 DIAGNOSIS — M160 Bilateral primary osteoarthritis of hip: Secondary | ICD-10-CM | POA: Diagnosis not present

## 2016-07-20 DIAGNOSIS — Z471 Aftercare following joint replacement surgery: Secondary | ICD-10-CM | POA: Diagnosis not present

## 2016-07-20 DIAGNOSIS — E11319 Type 2 diabetes mellitus with unspecified diabetic retinopathy without macular edema: Secondary | ICD-10-CM | POA: Diagnosis not present

## 2016-07-20 DIAGNOSIS — E1140 Type 2 diabetes mellitus with diabetic neuropathy, unspecified: Secondary | ICD-10-CM | POA: Diagnosis not present

## 2016-07-20 DIAGNOSIS — E1165 Type 2 diabetes mellitus with hyperglycemia: Secondary | ICD-10-CM | POA: Diagnosis not present

## 2016-07-22 DIAGNOSIS — Z471 Aftercare following joint replacement surgery: Secondary | ICD-10-CM | POA: Diagnosis not present

## 2016-07-22 DIAGNOSIS — E1140 Type 2 diabetes mellitus with diabetic neuropathy, unspecified: Secondary | ICD-10-CM | POA: Diagnosis not present

## 2016-07-22 DIAGNOSIS — E11319 Type 2 diabetes mellitus with unspecified diabetic retinopathy without macular edema: Secondary | ICD-10-CM | POA: Diagnosis not present

## 2016-07-22 DIAGNOSIS — M160 Bilateral primary osteoarthritis of hip: Secondary | ICD-10-CM | POA: Diagnosis not present

## 2016-07-22 DIAGNOSIS — E1165 Type 2 diabetes mellitus with hyperglycemia: Secondary | ICD-10-CM | POA: Diagnosis not present

## 2016-07-22 DIAGNOSIS — M5186 Other intervertebral disc disorders, lumbar region: Secondary | ICD-10-CM | POA: Diagnosis not present

## 2016-07-25 DIAGNOSIS — M25562 Pain in left knee: Secondary | ICD-10-CM | POA: Diagnosis not present

## 2016-07-25 DIAGNOSIS — M6281 Muscle weakness (generalized): Secondary | ICD-10-CM | POA: Diagnosis not present

## 2016-07-25 DIAGNOSIS — R262 Difficulty in walking, not elsewhere classified: Secondary | ICD-10-CM | POA: Diagnosis not present

## 2016-07-25 DIAGNOSIS — M25662 Stiffness of left knee, not elsewhere classified: Secondary | ICD-10-CM | POA: Diagnosis not present

## 2016-07-25 DIAGNOSIS — M1712 Unilateral primary osteoarthritis, left knee: Secondary | ICD-10-CM | POA: Diagnosis not present

## 2016-07-27 DIAGNOSIS — M6281 Muscle weakness (generalized): Secondary | ICD-10-CM | POA: Diagnosis not present

## 2016-07-27 DIAGNOSIS — R262 Difficulty in walking, not elsewhere classified: Secondary | ICD-10-CM | POA: Diagnosis not present

## 2016-07-27 DIAGNOSIS — M25562 Pain in left knee: Secondary | ICD-10-CM | POA: Diagnosis not present

## 2016-07-27 DIAGNOSIS — M25662 Stiffness of left knee, not elsewhere classified: Secondary | ICD-10-CM | POA: Diagnosis not present

## 2016-07-28 DIAGNOSIS — M25662 Stiffness of left knee, not elsewhere classified: Secondary | ICD-10-CM | POA: Diagnosis not present

## 2016-07-28 DIAGNOSIS — M6281 Muscle weakness (generalized): Secondary | ICD-10-CM | POA: Diagnosis not present

## 2016-07-28 DIAGNOSIS — M25562 Pain in left knee: Secondary | ICD-10-CM | POA: Diagnosis not present

## 2016-07-28 DIAGNOSIS — R262 Difficulty in walking, not elsewhere classified: Secondary | ICD-10-CM | POA: Diagnosis not present

## 2016-08-01 DIAGNOSIS — R262 Difficulty in walking, not elsewhere classified: Secondary | ICD-10-CM | POA: Diagnosis not present

## 2016-08-01 DIAGNOSIS — M25662 Stiffness of left knee, not elsewhere classified: Secondary | ICD-10-CM | POA: Diagnosis not present

## 2016-08-01 DIAGNOSIS — M25562 Pain in left knee: Secondary | ICD-10-CM | POA: Diagnosis not present

## 2016-08-01 DIAGNOSIS — M6281 Muscle weakness (generalized): Secondary | ICD-10-CM | POA: Diagnosis not present

## 2016-08-03 DIAGNOSIS — R262 Difficulty in walking, not elsewhere classified: Secondary | ICD-10-CM | POA: Diagnosis not present

## 2016-08-03 DIAGNOSIS — M25662 Stiffness of left knee, not elsewhere classified: Secondary | ICD-10-CM | POA: Diagnosis not present

## 2016-08-03 DIAGNOSIS — M6281 Muscle weakness (generalized): Secondary | ICD-10-CM | POA: Diagnosis not present

## 2016-08-03 DIAGNOSIS — M25562 Pain in left knee: Secondary | ICD-10-CM | POA: Diagnosis not present

## 2016-08-05 ENCOUNTER — Other Ambulatory Visit: Payer: Self-pay | Admitting: Family Medicine

## 2016-08-05 DIAGNOSIS — R262 Difficulty in walking, not elsewhere classified: Secondary | ICD-10-CM | POA: Diagnosis not present

## 2016-08-05 DIAGNOSIS — M25562 Pain in left knee: Secondary | ICD-10-CM | POA: Diagnosis not present

## 2016-08-05 DIAGNOSIS — E039 Hypothyroidism, unspecified: Secondary | ICD-10-CM

## 2016-08-05 DIAGNOSIS — M6281 Muscle weakness (generalized): Secondary | ICD-10-CM | POA: Diagnosis not present

## 2016-08-05 DIAGNOSIS — M25662 Stiffness of left knee, not elsewhere classified: Secondary | ICD-10-CM | POA: Diagnosis not present

## 2016-08-10 DIAGNOSIS — M25562 Pain in left knee: Secondary | ICD-10-CM | POA: Diagnosis not present

## 2016-08-10 DIAGNOSIS — M25662 Stiffness of left knee, not elsewhere classified: Secondary | ICD-10-CM | POA: Diagnosis not present

## 2016-08-10 DIAGNOSIS — R262 Difficulty in walking, not elsewhere classified: Secondary | ICD-10-CM | POA: Diagnosis not present

## 2016-08-10 DIAGNOSIS — M6281 Muscle weakness (generalized): Secondary | ICD-10-CM | POA: Diagnosis not present

## 2016-08-12 DIAGNOSIS — M6281 Muscle weakness (generalized): Secondary | ICD-10-CM | POA: Diagnosis not present

## 2016-08-12 DIAGNOSIS — R262 Difficulty in walking, not elsewhere classified: Secondary | ICD-10-CM | POA: Diagnosis not present

## 2016-08-12 DIAGNOSIS — M25562 Pain in left knee: Secondary | ICD-10-CM | POA: Diagnosis not present

## 2016-08-12 DIAGNOSIS — M25662 Stiffness of left knee, not elsewhere classified: Secondary | ICD-10-CM | POA: Diagnosis not present

## 2016-08-15 ENCOUNTER — Other Ambulatory Visit (INDEPENDENT_AMBULATORY_CARE_PROVIDER_SITE_OTHER): Payer: Medicare Other

## 2016-08-15 DIAGNOSIS — E039 Hypothyroidism, unspecified: Secondary | ICD-10-CM

## 2016-08-15 LAB — TSH: TSH: 2.25 u[IU]/mL (ref 0.35–4.50)

## 2016-08-19 DIAGNOSIS — M25562 Pain in left knee: Secondary | ICD-10-CM | POA: Diagnosis not present

## 2016-08-19 DIAGNOSIS — M6281 Muscle weakness (generalized): Secondary | ICD-10-CM | POA: Diagnosis not present

## 2016-08-19 DIAGNOSIS — M25662 Stiffness of left knee, not elsewhere classified: Secondary | ICD-10-CM | POA: Diagnosis not present

## 2016-08-19 DIAGNOSIS — R262 Difficulty in walking, not elsewhere classified: Secondary | ICD-10-CM | POA: Diagnosis not present

## 2016-08-23 DIAGNOSIS — M25662 Stiffness of left knee, not elsewhere classified: Secondary | ICD-10-CM | POA: Diagnosis not present

## 2016-08-23 DIAGNOSIS — R262 Difficulty in walking, not elsewhere classified: Secondary | ICD-10-CM | POA: Diagnosis not present

## 2016-08-23 DIAGNOSIS — M25562 Pain in left knee: Secondary | ICD-10-CM | POA: Diagnosis not present

## 2016-08-23 DIAGNOSIS — M6281 Muscle weakness (generalized): Secondary | ICD-10-CM | POA: Diagnosis not present

## 2016-08-25 ENCOUNTER — Other Ambulatory Visit: Payer: Self-pay | Admitting: Family Medicine

## 2016-08-26 DIAGNOSIS — R262 Difficulty in walking, not elsewhere classified: Secondary | ICD-10-CM | POA: Diagnosis not present

## 2016-08-26 DIAGNOSIS — M6281 Muscle weakness (generalized): Secondary | ICD-10-CM | POA: Diagnosis not present

## 2016-08-26 DIAGNOSIS — M25562 Pain in left knee: Secondary | ICD-10-CM | POA: Diagnosis not present

## 2016-08-26 DIAGNOSIS — M25662 Stiffness of left knee, not elsewhere classified: Secondary | ICD-10-CM | POA: Diagnosis not present

## 2016-09-06 ENCOUNTER — Ambulatory Visit (HOSPITAL_COMMUNITY)
Admission: RE | Admit: 2016-09-06 | Discharge: 2016-09-06 | Disposition: A | Discharge status: Home / Self Care | Payer: Medicare Other | Source: Ambulatory Visit | Attending: Orthopedic Surgery | Admitting: Orthopedic Surgery

## 2016-09-06 ENCOUNTER — Other Ambulatory Visit (HOSPITAL_COMMUNITY): Payer: Self-pay | Admitting: Orthopedic Surgery

## 2016-09-06 DIAGNOSIS — M7989 Other specified soft tissue disorders: Secondary | ICD-10-CM | POA: Insufficient documentation

## 2016-09-06 DIAGNOSIS — M79604 Pain in right leg: Secondary | ICD-10-CM

## 2016-09-06 DIAGNOSIS — M79605 Pain in left leg: Secondary | ICD-10-CM | POA: Diagnosis not present

## 2016-09-06 NOTE — Progress Notes (Signed)
*  Preliminary Results* Left lower extremity venous duplex completed. Left lower extremity is negative for deep vein thrombosis. There is evidence of left Baker's cyst.  09/06/2016 11:18 AM  Maudry Mayhew, BS, RVT, RDCS, RDMS

## 2016-09-07 ENCOUNTER — Ambulatory Visit (INDEPENDENT_AMBULATORY_CARE_PROVIDER_SITE_OTHER): Payer: Medicare Other | Admitting: Family Medicine

## 2016-09-07 ENCOUNTER — Encounter: Payer: Self-pay | Admitting: Family Medicine

## 2016-09-07 VITALS — BP 130/82 | HR 63 | Temp 98.1°F | Ht 72.0 in | Wt 338.0 lb

## 2016-09-07 DIAGNOSIS — E1165 Type 2 diabetes mellitus with hyperglycemia: Secondary | ICD-10-CM | POA: Diagnosis not present

## 2016-09-07 DIAGNOSIS — I2510 Atherosclerotic heart disease of native coronary artery without angina pectoris: Secondary | ICD-10-CM | POA: Diagnosis not present

## 2016-09-07 DIAGNOSIS — Z794 Long term (current) use of insulin: Secondary | ICD-10-CM

## 2016-09-07 DIAGNOSIS — E113559 Type 2 diabetes mellitus with stable proliferative diabetic retinopathy, unspecified eye: Secondary | ICD-10-CM | POA: Diagnosis not present

## 2016-09-07 MED ORDER — BASAGLAR KWIKPEN 100 UNIT/ML ~~LOC~~ SOPN: 15.0000 [IU] | PEN_INJECTOR | Freq: Every day | SUBCUTANEOUS | 3 refills | Status: DC

## 2016-09-07 NOTE — Progress Notes (Signed)
BP 130/82   Pulse 63   Temp 98.1 F (36.7 C)   Ht 6' (1.829 m)   Wt (!) 338 lb (153.3 kg)   SpO2 97%   BMI 45.84 kg/m    CC: f/u visit Subjective:    Patient ID: Chad [name], male    DOB: 1955/09/29, 61 y.o.   MRN: [id number]  HPI: Chad [name] is a 61 y.o. male presenting on 09/07/2016 for Follow-up   Father-in law passed away this weekend - upcoming funeral 11am. Had been bedridden for 4 yrs. MIL broke her hip in [facility] Wednesday. He was in hospice for 1 month. No blood clots.   Recent L knee replacement by Dr Noemi Chapel. Some L knee swelling - found to have pocket of fluid behind knee. Has missed several therapy appointments.   DM - we recently restarted basaglar 10u nightly due to persistently elevated readings. He self increased to 15 units 2 days ago. Brings log of fasting readings over the past month - 130-190, averaging 160.  Lab Results  Component Value Date   HGBA1C 6.9 (H) 07/11/2016    Relevant past medical, surgical, family and social history reviewed and updated as indicated. Interim medical history since our last visit reviewed. Allergies and medications reviewed and updated. Outpatient Medications Prior to Visit  Medication Sig Dispense Refill  . acetaminophen (TYLENOL) 325 MG tablet Take 2 tablets (650 mg total) by mouth every 6 (six) hours as needed for mild pain (or Fever >/= 101).    Marland Kitchen albuterol (VENTOLIN HFA) 108 (90 Base) MCG/ACT inhaler Inhale 1 puff into the lungs every 6 (six) hours as needed for wheezing or shortness of breath.    Marland Kitchen aspirin EC 325 MG EC tablet 1 tab a day for the next 30 days to prevent blood clots 30 tablet 0  . Docusate Sodium (DSS) 100 MG CAPS 1 tab 2 times a day while on narcotics.  STOOL SOFTENER 60 each 0  . fluticasone (FLONASE) 50 MCG/ACT nasal spray PLACE 2 SPRAYS INTO BOTH NOSTRILS DAILY 16 g 1  . furosemide (LASIX) 20 MG tablet Take 20 mg by mouth every Monday, Wednesday, and Friday.     Marland Kitchen glimepiride (AMARYL) 2 MG  tablet Take 2 tablets (4 mg total) by mouth daily with breakfast. 60 tablet 6  . HYDROmorphone (DILAUDID) 2 MG tablet 1-2 tab q 4-6 hrs prn pain 60 tablet 0  . ibuprofen (ADVIL,MOTRIN) 200 MG tablet Take 200 mg by mouth at bedtime.     . Insulin Pen Needle 31G X 6 MM MISC Use as directed for lantus solostar pen 100 each 3  . levothyroxine (SYNTHROID, LEVOTHROID) 112 MCG tablet Take 1 tablet (112 mcg total) by mouth daily. 30 tablet 6  . lisinopril (PRINIVIL,ZESTRIL) 20 MG tablet Take 1 tablet (20 mg total) by mouth daily. 30 tablet 6  . metFORMIN (GLUCOPHAGE) 1000 MG tablet TAKE 1 TABLET BY MOUTH 2 TIMES DAILY WITH A MEAL 180 tablet 1  . metoCLOPramide (REGLAN) 10 MG tablet TAKE 1 TABLET BY MOUTH 3 TIMES DAILY BEFORE MEALS 90 tablet 0  . metoprolol tartrate (LOPRESSOR) 25 MG tablet TAKE 1 TABLET BY MOUTH TWICE A DAY 60 tablet 11  . Multiple Vitamin (MULITIVITAMIN WITH MINERALS) TABS Take 1 tablet by mouth daily.    Marland Kitchen NEXIUM 40 MG capsule TAKE 1 CAPSULE BY MOUTH TWICE DAILY 60 capsule 0  . nitroGLYCERIN (NITROSTAT) 0.4 MG SL tablet Place 1 tablet (0.4 mg total) under the tongue  every 5 (five) minutes as needed for chest pain. 60 tablet 3  . polyethylene glycol (MIRALAX / GLYCOLAX) packet 17grams in 16 oz of water twice a day until bowel movement.  LAXITIVE.  Restart if two days since last bowel movement 14 each 0  . potassium chloride (K-DUR) 10 MEQ tablet Take 10 mEq by mouth every Monday, Wednesday, and Friday.     . Red Yeast Rice 600 MG CAPS Take 1 capsule (600 mg total) by mouth 2 (two) times daily.    . vitamin E 400 UNIT capsule Take 400 Units by mouth daily.    . Insulin Glargine (BASAGLAR KWIKPEN) 100 UNIT/ML SOPN Inject 13 Units into the skin at bedtime.      No facility-administered medications prior to visit.      Per HPI unless specifically indicated in ROS section below Review of Systems     Objective:    BP 130/82   Pulse 63   Temp 98.1 F (36.7 C)   Ht 6' (1.829 m)   Wt  (!) 338 lb (153.3 kg)   SpO2 97%   BMI 45.84 kg/m   Wt Readings from Last 3 Encounters:  09/07/16 (!) 338 lb (153.3 kg)  07/11/16 (!) 335 lb (152 kg)  06/08/16 (!) 325 lb (147.4 kg)    Physical Exam  Constitutional: He appears well-developed and well-nourished. No distress.  HENT:  Mouth/Throat: Oropharynx is clear and moist. No oropharyngeal exudate.  Eyes: Pupils are equal, round, and reactive to light. Conjunctivae and EOM are normal.  Cardiovascular: Normal rate, regular rhythm, normal heart sounds and intact distal pulses.   No murmur heard. Pulmonary/Chest: Effort normal and breath sounds normal. No respiratory distress. He has no wheezes. He has no rales.  Musculoskeletal: He exhibits no edema.  Skin: Skin is warm and dry. No rash noted.  Psychiatric: He has a normal mood and affect.  Nursing note and vitals reviewed.  Results for orders placed or performed in visit on 08/15/16  TSH  Result Value Ref Range   TSH 2.25 0.35 - 4.50 uIU/mL      Assessment & Plan:  I've asked patient to return in 2 months for medicare wellness visit  Problem List Items Addressed This Visit    Type 2 diabetes mellitus, uncontrolled, with retinopathy ([place]) - Primary    Pt has increased lantus to 15u nightly. Brings log of sugars showing average fasting ~160. Difficult stressful period with family member deaths and illnesses. Not able to be regular with diet regimen. No other changes today.  Too soon for A1c.  RTC 2 mo f/u visit      Relevant Medications   Insulin Glargine (BASAGLAR KWIKPEN) 100 UNIT/ML SOPN       Follow up plan: Return in about 2 months (around 11/08/2016) for medicare wellness visit.  [name], MD

## 2016-09-07 NOTE — Patient Instructions (Addendum)
I'm so sorry to hear about your father.  Return in 2 months for labs and wellness visit Continue current medicines including lantus 15 units nightly.

## 2016-09-07 NOTE — Assessment & Plan Note (Addendum)
Pt has increased lantus to 15u nightly. Brings log of sugars showing average fasting ~160. Difficult stressful period with family member deaths and illnesses. Not able to be regular with diet regimen. No other changes today.  Too soon for A1c.  RTC 2 mo f/u visit

## 2016-09-09 ENCOUNTER — Ambulatory Visit: Payer: Medicare Other | Admitting: Internal Medicine

## 2016-09-09 ENCOUNTER — Emergency Department
Admission: EM | Admit: 2016-09-09 | Discharge: 2016-09-09 | Disposition: A | Discharge status: Home / Self Care | Payer: Medicare Other | Attending: Emergency Medicine | Admitting: Emergency Medicine

## 2016-09-09 ENCOUNTER — Encounter: Payer: Self-pay | Admitting: *Deleted

## 2016-09-09 ENCOUNTER — Telehealth: Payer: Self-pay | Admitting: Family Medicine

## 2016-09-09 DIAGNOSIS — I2510 Atherosclerotic heart disease of native coronary artery without angina pectoris: Secondary | ICD-10-CM | POA: Insufficient documentation

## 2016-09-09 DIAGNOSIS — Z79899 Other long term (current) drug therapy: Secondary | ICD-10-CM | POA: Insufficient documentation

## 2016-09-09 DIAGNOSIS — I5032 Chronic diastolic (congestive) heart failure: Secondary | ICD-10-CM | POA: Insufficient documentation

## 2016-09-09 DIAGNOSIS — E039 Hypothyroidism, unspecified: Secondary | ICD-10-CM | POA: Insufficient documentation

## 2016-09-09 DIAGNOSIS — J0140 Acute pansinusitis, unspecified: Secondary | ICD-10-CM | POA: Diagnosis not present

## 2016-09-09 DIAGNOSIS — Z7984 Long term (current) use of oral hypoglycemic drugs: Secondary | ICD-10-CM | POA: Insufficient documentation

## 2016-09-09 DIAGNOSIS — Z96651 Presence of right artificial knee joint: Secondary | ICD-10-CM | POA: Insufficient documentation

## 2016-09-09 DIAGNOSIS — G501 Atypical facial pain: Secondary | ICD-10-CM | POA: Diagnosis present

## 2016-09-09 DIAGNOSIS — Z794 Long term (current) use of insulin: Secondary | ICD-10-CM | POA: Insufficient documentation

## 2016-09-09 DIAGNOSIS — M436 Torticollis: Secondary | ICD-10-CM

## 2016-09-09 DIAGNOSIS — Z7982 Long term (current) use of aspirin: Secondary | ICD-10-CM | POA: Insufficient documentation

## 2016-09-09 DIAGNOSIS — I110 Hypertensive heart disease with heart failure: Secondary | ICD-10-CM | POA: Diagnosis not present

## 2016-09-09 DIAGNOSIS — M542 Cervicalgia: Secondary | ICD-10-CM | POA: Diagnosis not present

## 2016-09-09 DIAGNOSIS — E119 Type 2 diabetes mellitus without complications: Secondary | ICD-10-CM | POA: Diagnosis not present

## 2016-09-09 MED ORDER — AMOXICILLIN-POT CLAVULANATE 875-125 MG PO TABS: 1.0000 | ORAL_TABLET | Freq: Two times a day (BID) | ORAL | 0 refills | Status: AC

## 2016-09-09 MED ORDER — METHOCARBAMOL 500 MG PO TABS
1000.0000 mg | ORAL_TABLET | Freq: Once | ORAL | Status: AC
  Administered 2016-09-09: 1000 mg via ORAL
  Filled 2016-09-09: qty 2

## 2016-09-09 MED ORDER — HYDROCODONE-ACETAMINOPHEN 5-325 MG PO TABS
2.0000 | ORAL_TABLET | Freq: Once | ORAL | Status: AC
  Administered 2016-09-09: 2 via ORAL
  Filled 2016-09-09: qty 2

## 2016-09-09 MED ORDER — METHOCARBAMOL 500 MG PO TABS: ORAL_TABLET | ORAL | 0 refills | Status: DC

## 2016-09-09 MED ORDER — HYDROCODONE-ACETAMINOPHEN 5-325 MG PO TABS: 1.0000 | ORAL_TABLET | ORAL | 0 refills | Status: DC | PRN
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
## 2016-09-09 NOTE — Telephone Encounter (Signed)
Dr Darnell Level advised pt needed to be seen this AM instead of today at 4:15. appt cancelled for 4:15 and pt said he would go to UC now. FYI to Dr Darnell Level.

## 2016-09-09 NOTE — Telephone Encounter (Signed)
Patient Name: LAUREN AGUAYO  DOB: Mar 20, 1955    Initial Comment Caller states her husband has neck pain. Trouble swallowing, Very weak.   Nurse Assessment  Nurse: Cherie Dark RN, Jarrett Soho Date/Time (Eastern Time): 09/09/2016 8:29:30 AM  Confirm and document reason for call. If symptomatic, describe symptoms. ---Caller states her husband is having neck pain, trouble swallowing, and is very weak. The left side of the neck is very sore. Has been this way for a couple days and neck pain got worse last night.  Does the patient have any new or worsening symptoms? ---Yes  Will a triage be completed? ---Yes  Related visit to physician within the last 2 weeks? ---No  Does the PT have any chronic conditions? (i.e. diabetes, asthma, etc.) ---Yes  List chronic conditions. ---diabetes, high cholesterol, HTN,  Is this a behavioral health or substance abuse call? ---No     Guidelines    Guideline Title Affirmed Question Affirmed Notes  Neck Pain or Stiffness [1] Stiff neck (can't put chin to chest) AND [2] headache    Final Disposition User   Go to ED Now [place], RN, [employer] with backline and informed them that patient refused ER outcome and wanted to be seen today instead. She stated she would call the patient back and see about putting them on the schedule today.   Referrals  GO TO FACILITY REFUSED  GO TO FACILITY REFUSED   Disagree/Comply: Disagree  Disagree/Comply Reason: Disagree with instructions

## 2016-09-09 NOTE — ED Triage Notes (Addendum)
States feeling congested 3 days ago and then developed neck stiffness, right ear pain and sore throat, states he blew his nose and it was bloody, states it hurts to move his neck any direction, states his father passed away May 07, 2022 and he has been under a lot of stress, states some relief with ice pack,  ambulatory in no acute distress

## 2016-09-09 NOTE — Telephone Encounter (Signed)
Pt has appt with Avie Echevaria NP today at 4:15.

## 2016-09-09 NOTE — Discharge Instructions (Signed)
Continue use ice or heat to your neck as needed for comfort. Follow-up with your primary care doctor if any continued problems. Begin taking Robaxin one or 2 tablets every 6 hours as needed for muscle spasms. Norco every 4 hours as needed for pain. This medication is a narcotic and you cannot drive or operate machinery while taking it. Continue using Flonase nasal spray. Augmentin 875 twice a day for 10 days for sinus infection. Increase fluids.

## 2016-09-14 ENCOUNTER — Other Ambulatory Visit: Payer: Self-pay | Admitting: Family Medicine

## 2016-09-15 ENCOUNTER — Other Ambulatory Visit: Payer: Self-pay | Admitting: Family Medicine

## 2016-09-15 MED ORDER — NEXIUM 40 MG PO CPDR: 40.0000 mg | DELAYED_RELEASE_CAPSULE | Freq: Two times a day (BID) | ORAL | 3 refills | Status: DC

## 2016-09-15 MED ORDER — LEVOTHYROXINE SODIUM 112 MCG PO TABS: 112.0000 ug | ORAL_TABLET | Freq: Every day | ORAL | 3 refills | Status: DC

## 2016-09-15 MED ORDER — LISINOPRIL 20 MG PO TABS: 20.0000 mg | ORAL_TABLET | Freq: Every day | ORAL | 3 refills | Status: DC

## 2016-09-19 ENCOUNTER — Ambulatory Visit (INDEPENDENT_AMBULATORY_CARE_PROVIDER_SITE_OTHER): Payer: Medicare Other | Admitting: Family Medicine

## 2016-09-19 ENCOUNTER — Encounter: Payer: Self-pay | Admitting: Family Medicine

## 2016-09-19 VITALS — BP 138/62 | HR 85 | Temp 98.1°F | Wt 335.2 lb

## 2016-09-19 DIAGNOSIS — I2510 Atherosclerotic heart disease of native coronary artery without angina pectoris: Secondary | ICD-10-CM

## 2016-09-19 DIAGNOSIS — J0121 Acute recurrent ethmoidal sinusitis: Secondary | ICD-10-CM | POA: Diagnosis not present

## 2016-09-19 MED ORDER — AMOXICILLIN-POT CLAVULANATE 875-125 MG PO TABS: 1.0000 | ORAL_TABLET | Freq: Two times a day (BID) | ORAL | 0 refills | Status: AC

## 2016-09-19 NOTE — Patient Instructions (Signed)
I think you needed extended course of augmentin - take for 10 days to 2 weeks. If worsening on antibiotic, let us know.  If no better with above, let us know.  Continue plain mucinex with plenty of water to mobilize mucous.

## 2016-09-19 NOTE — Progress Notes (Signed)
BP 138/62   Pulse 85   Temp 98.1 F (36.7 C) (Oral)   Wt (!) 335 lb 4 oz (152.1 kg)   SpO2 96%   BMI 45.47 kg/m    CC: nasal congestion Subjective:    Patient ID: Chad [name], male    DOB: 1955-08-15, 61 y.o.   MRN: [id number]  HPI: Chad [name] is a 61 y.o. male presenting on 09/19/2016 for Nasal Congestion   Recent ER visit for torticollis 10 days ago, note reviewed. At that time he was also diagnosed with acute pansinusitis and treated with 7d augmentin course. He felt this did help while on abx.   Now with 2 wk h/o malaise, fatigue, ongoing sore throat, purulent mucous, nasal congestion head > chest congestion. Coughing fits with dypsnea.   No fevers/chills, ear or tooth pain.   Hasn't tried anything else right now. He has also been using flonase.   Relevant past medical, surgical, family and social history reviewed and updated as indicated. Interim medical history since our last visit reviewed. Allergies and medications reviewed and updated. Outpatient Medications Prior to Visit  Medication Sig Dispense Refill  . albuterol (VENTOLIN HFA) 108 (90 Base) MCG/ACT inhaler Inhale 1 puff into the lungs every 6 (six) hours as needed for wheezing or shortness of breath.    Mariane Baumgarten Sodium (DSS) 100 MG CAPS 1 tab 2 times a day while on narcotics.  STOOL SOFTENER 60 each 0  . fluticasone (FLONASE) 50 MCG/ACT nasal spray PLACE 2 SPRAYS INTO BOTH NOSTRILS DAILY 16 g 1  . furosemide (LASIX) 20 MG tablet Take 20 mg by mouth every Monday, Wednesday, and Friday.     Marland Kitchen glimepiride (AMARYL) 2 MG tablet Take 2 tablets (4 mg total) by mouth daily with breakfast. 60 tablet 6  . HYDROcodone-acetaminophen (NORCO/VICODIN) 5-325 MG tablet Take 1-2 tablets by mouth every 4 (four) hours as needed for moderate pain. 20 tablet 0  . HYDROmorphone (DILAUDID) 2 MG tablet 1-2 tab q 4-6 hrs prn pain 60 tablet 0  . ibuprofen (ADVIL,MOTRIN) 200 MG tablet Take 200 mg by mouth at bedtime.     . Insulin  Glargine (BASAGLAR KWIKPEN) 100 UNIT/ML SOPN Inject 0.15 mLs (15 Units total) into the skin at bedtime. 18 mL 3  . Insulin Pen Needle 31G X 6 MM MISC Use as directed for lantus solostar pen 100 each 3  . levothyroxine (SYNTHROID, LEVOTHROID) 112 MCG tablet Take 1 tablet (112 mcg total) by mouth daily. 90 tablet 3  . lisinopril (PRINIVIL,ZESTRIL) 20 MG tablet Take 1 tablet (20 mg total) by mouth daily. 90 tablet 3  . metFORMIN (GLUCOPHAGE) 1000 MG tablet TAKE 1 TABLET BY MOUTH 2 TIMES DAILY WITH A MEAL 180 tablet 1  . methocarbamol (ROBAXIN) 500 MG tablet 1-2 tablets every 6 hours prn muscle spasms 20 tablet 0  . metoCLOPramide (REGLAN) 10 MG tablet TAKE 1 TABLET BY MOUTH 3 TIMES DAILY BEFORE MEALS 90 tablet 2  . metoprolol tartrate (LOPRESSOR) 25 MG tablet TAKE 1 TABLET BY MOUTH TWICE A DAY 60 tablet 11  . Multiple Vitamin (MULITIVITAMIN WITH MINERALS) TABS Take 1 tablet by mouth daily.    Marland Kitchen NEXIUM 40 MG capsule Take 1 capsule (40 mg total) by mouth 2 (two) times daily. 180 capsule 3  . nitroGLYCERIN (NITROSTAT) 0.4 MG SL tablet Place 1 tablet (0.4 mg total) under the tongue every 5 (five) minutes as needed for chest pain. 60 tablet 3  . polyethylene glycol (MIRALAX /  GLYCOLAX) packet 17grams in 16 oz of water twice a day until bowel movement.  LAXITIVE.  Restart if two days since last bowel movement 14 each 0  . potassium chloride (K-DUR) 10 MEQ tablet Take 10 mEq by mouth every Monday, Wednesday, and Friday.     . Red Yeast Rice 600 MG CAPS Take 1 capsule (600 mg total) by mouth 2 (two) times daily.    . vitamin E 400 UNIT capsule Take 400 Units by mouth daily.    Marland Kitchen acetaminophen (TYLENOL) 325 MG tablet Take 2 tablets (650 mg total) by mouth every 6 (six) hours as needed for mild pain (or Fever >/= 101).    Marland Kitchen aspirin EC 325 MG EC tablet 1 tab a day for the next 30 days to prevent blood clots 30 tablet 0   No facility-administered medications prior to visit.      Per HPI unless specifically  indicated in ROS section below Review of Systems     Objective:    BP 138/62   Pulse 85   Temp 98.1 F (36.7 C) (Oral)   Wt (!) 335 lb 4 oz (152.1 kg)   SpO2 96%   BMI 45.47 kg/m   Wt Readings from Last 3 Encounters:  09/19/16 (!) 335 lb 4 oz (152.1 kg)  09/09/16 (!) 338 lb (153.3 kg)  09/07/16 (!) 338 lb (153.3 kg)    Physical Exam  Constitutional: He appears well-developed and well-nourished. No distress.  HENT:  Head: Normocephalic and atraumatic.  Right Ear: Hearing, tympanic membrane, external ear and ear canal normal.  Left Ear: Hearing, tympanic membrane, external ear and ear canal normal.  Nose: Mucosal edema (injection/congestion) present. No rhinorrhea. Right sinus exhibits no maxillary sinus tenderness and no frontal sinus tenderness. Left sinus exhibits no maxillary sinus tenderness and no frontal sinus tenderness.  Mouth/Throat: Uvula is midline, oropharynx is clear and moist and mucous membranes are normal. No oropharyngeal exudate, posterior oropharyngeal edema, posterior oropharyngeal erythema or tonsillar abscesses.  Bilateral ethmoidal sinus tenderness  Eyes: Pupils are equal, round, and reactive to light. Conjunctivae and EOM are normal. No scleral icterus.  Neck: Normal range of motion. Neck supple.  Cardiovascular: Normal rate, regular rhythm, normal heart sounds and intact distal pulses.   No murmur heard. Pulmonary/Chest: Effort normal and breath sounds normal. No respiratory distress. He has no wheezes. He has no rales.  Lungs clear  Lymphadenopathy:    He has no cervical adenopathy.  Skin: Skin is warm and dry. No rash noted.  Nursing note and vitals reviewed.     Assessment & Plan:   Problem List Items Addressed This Visit    Acute recurrent ethmoidal sinusitis - Primary    Anticipate recurrence of acute ethmoidal sinusitis recently treated with 7d augmentin course. sxs were improving while on abx but recurred off augmentin. Will extend augmentin  course for another 2 wks, with instructions to stop early if symptoms fully resolved after 10 days. Update if worsening despite treatment. Further supportive care reviewed with patient. Pt agrees with plan.       Relevant Medications   amoxicillin-clavulanate (AUGMENTIN) 875-125 MG tablet       Follow up plan: Return if symptoms worsen or fail to improve.  [name], MD

## 2016-09-19 NOTE — Assessment & Plan Note (Signed)
Anticipate recurrence of acute ethmoidal sinusitis recently treated with 7d augmentin course. sxs were improving while on abx but recurred off augmentin. Will extend augmentin course for another 2 wks, with instructions to stop early if symptoms fully resolved after 10 days. Update if worsening despite treatment. Further supportive care reviewed with patient. Pt agrees with plan.

## 2016-09-20 ENCOUNTER — Ambulatory Visit: Payer: Medicare Other | Admitting: Family Medicine

## 2016-10-05 ENCOUNTER — Other Ambulatory Visit: Payer: Self-pay | Admitting: Family Medicine

## 2016-10-05 MED ORDER — GLIMEPIRIDE 2 MG PO TABS: 4.0000 mg | ORAL_TABLET | Freq: Every day | ORAL | 1 refills | Status: DC

## 2016-10-05 NOTE — Telephone Encounter (Signed)
Received request from [facility] for glimepiride refill for 90 days through [employer]. Sent in.

## 2016-10-06 DIAGNOSIS — M25562 Pain in left knee: Secondary | ICD-10-CM | POA: Diagnosis not present

## 2016-10-10 ENCOUNTER — Other Ambulatory Visit: Payer: Self-pay | Admitting: Family Medicine

## 2016-10-13 ENCOUNTER — Encounter: Payer: Self-pay | Admitting: Family Medicine

## 2016-10-13 ENCOUNTER — Ambulatory Visit (INDEPENDENT_AMBULATORY_CARE_PROVIDER_SITE_OTHER): Payer: Medicare Other | Admitting: Family Medicine

## 2016-10-13 VITALS — BP 140/74 | HR 73 | Temp 98.4°F | Wt 340.5 lb

## 2016-10-13 DIAGNOSIS — I2510 Atherosclerotic heart disease of native coronary artery without angina pectoris: Secondary | ICD-10-CM | POA: Diagnosis not present

## 2016-10-13 DIAGNOSIS — E1165 Type 2 diabetes mellitus with hyperglycemia: Secondary | ICD-10-CM

## 2016-10-13 DIAGNOSIS — E113559 Type 2 diabetes mellitus with stable proliferative diabetic retinopathy, unspecified eye: Secondary | ICD-10-CM | POA: Diagnosis not present

## 2016-10-13 DIAGNOSIS — Z794 Long term (current) use of insulin: Secondary | ICD-10-CM

## 2016-10-13 DIAGNOSIS — J209 Acute bronchitis, unspecified: Secondary | ICD-10-CM | POA: Diagnosis not present

## 2016-10-13 MED ORDER — HYDROCOD POLST-CPM POLST ER 10-8 MG/5ML PO SUER
5.0000 mL | Freq: Every evening | ORAL | 0 refills | Status: DC | PRN
Start: 2016-10-13 — End: 2016-12-09

## 2016-10-13 NOTE — Progress Notes (Signed)
BP 140/74   Pulse 73   Temp 98.4 F (36.9 C) (Oral)   Wt (!) 340 lb 8 oz (154.4 kg)   SpO2 95%   BMI 46.18 kg/m    CC: cough Subjective:    Patient ID: Chad [name], male    DOB: 12-28-1955, 61 y.o.   MRN: [id number]  HPI: Chad [name] is a 61 y.o. male presenting on 10/13/2016 for Cough   Seen last month at ER 7/23 then 7/30 with dx acute recurrent ethmoid sinusitis and treated initially with 7d augmentin then extended 2 wk augmentin course. He did feel significantly better while on antibiotics. He was also treated with mucinex.   4d ago cough returned along with sore throat. Productive cough of thick yellow mucous. + HA. Cough worse in the mornings and evenings. Stays dyspneic. Sore chest wall from coughing. No significant head congestion.   No fevers, PNdrainage, ear pain.   Using left over tussionex which is only thing that helps him sleep at night.  He has been using albuterol inhaler.   Recently had to repair water leak at home with mildew. Wonders if sheet rock dust if affecting him.   Sugars actually improved after finishing augmentin course.   Relevant past medical, surgical, family and social history reviewed and updated as indicated. Interim medical history since our last visit reviewed. Allergies and medications reviewed and updated. Outpatient Medications Prior to Visit  Medication Sig Dispense Refill  . albuterol (VENTOLIN HFA) 108 (90 Base) MCG/ACT inhaler Inhale 1 puff into the lungs every 6 (six) hours as needed for wheezing or shortness of breath.    Marland Kitchen aspirin 81 MG chewable tablet Chew 162 mg by mouth daily.    Mariane Baumgarten Sodium (DSS) 100 MG CAPS 1 tab 2 times a day while on narcotics.  STOOL SOFTENER 60 each 0  . fluticasone (FLONASE) 50 MCG/ACT nasal spray PLACE 2 SPRAYS INTO BOTH NOSTRILS DAILY 16 g 2  . furosemide (LASIX) 20 MG tablet Take 20 mg by mouth every Monday, Wednesday, and Friday.     Marland Kitchen glimepiride (AMARYL) 2 MG tablet Take 2 tablets (4 mg  total) by mouth daily with breakfast. 180 tablet 1  . HYDROcodone-acetaminophen (NORCO/VICODIN) 5-325 MG tablet Take 1-2 tablets by mouth every 4 (four) hours as needed for moderate pain. 20 tablet 0  . ibuprofen (ADVIL,MOTRIN) 200 MG tablet Take 200 mg by mouth at bedtime.     . Insulin Glargine (BASAGLAR KWIKPEN) 100 UNIT/ML SOPN Inject 0.15 mLs (15 Units total) into the skin at bedtime. 18 mL 3  . Insulin Pen Needle 31G X 6 MM MISC Use as directed for lantus solostar pen 100 each 3  . levothyroxine (SYNTHROID, LEVOTHROID) 112 MCG tablet Take 1 tablet (112 mcg total) by mouth daily. 90 tablet 3  . lisinopril (PRINIVIL,ZESTRIL) 20 MG tablet Take 1 tablet (20 mg total) by mouth daily. 90 tablet 3  . metFORMIN (GLUCOPHAGE) 1000 MG tablet TAKE 1 TABLET BY MOUTH 2 TIMES DAILY WITH A MEAL 180 tablet 1  . methocarbamol (ROBAXIN) 500 MG tablet 1-2 tablets every 6 hours prn muscle spasms 20 tablet 0  . metoCLOPramide (REGLAN) 10 MG tablet TAKE 1 TABLET BY MOUTH 3 TIMES DAILY BEFORE MEALS 90 tablet 2  . metoprolol tartrate (LOPRESSOR) 25 MG tablet TAKE 1 TABLET BY MOUTH TWICE A DAY 60 tablet 11  . Multiple Vitamin (MULITIVITAMIN WITH MINERALS) TABS Take 1 tablet by mouth daily.    Marland Kitchen NEXIUM 40  MG capsule Take 1 capsule (40 mg total) by mouth 2 (two) times daily. 180 capsule 3  . nitroGLYCERIN (NITROSTAT) 0.4 MG SL tablet Place 1 tablet (0.4 mg total) under the tongue every 5 (five) minutes as needed for chest pain. 60 tablet 3  . polyethylene glycol (MIRALAX / GLYCOLAX) packet 17grams in 16 oz of water twice a day until bowel movement.  LAXITIVE.  Restart if two days since last bowel movement 14 each 0  . potassium chloride (K-DUR) 10 MEQ tablet Take 10 mEq by mouth every Monday, Wednesday, and Friday.     . Red Yeast Rice 600 MG CAPS Take 1 capsule (600 mg total) by mouth 2 (two) times daily.    . vitamin E 400 UNIT capsule Take 400 Units by mouth daily.     No facility-administered medications prior to  visit.      Per HPI unless specifically indicated in ROS section below Review of Systems     Objective:    BP 140/74   Pulse 73   Temp 98.4 F (36.9 C) (Oral)   Wt (!) 340 lb 8 oz (154.4 kg)   SpO2 95%   BMI 46.18 kg/m   Wt Readings from Last 3 Encounters:  10/13/16 (!) 340 lb 8 oz (154.4 kg)  09/19/16 (!) 335 lb 4 oz (152.1 kg)  09/09/16 (!) 338 lb (153.3 kg)    Physical Exam  Constitutional: He appears well-developed and well-nourished. No distress.  HENT:  Head: Normocephalic and atraumatic.  Right Ear: Hearing, tympanic membrane, external ear and ear canal normal.  Left Ear: Hearing, tympanic membrane, external ear and ear canal normal.  Nose: Mucosal edema (without significnat erythema or congestion) present. No rhinorrhea. Right sinus exhibits no maxillary sinus tenderness and no frontal sinus tenderness. Left sinus exhibits no maxillary sinus tenderness and no frontal sinus tenderness.  Mouth/Throat: Uvula is midline, oropharynx is clear and moist and mucous membranes are normal. No oropharyngeal exudate, posterior oropharyngeal edema, posterior oropharyngeal erythema or tonsillar abscesses.  Eyes: Pupils are equal, round, and reactive to light. Conjunctivae and EOM are normal. No scleral icterus.  Neck: Normal range of motion. Neck supple.  Cardiovascular: Normal rate, regular rhythm, normal heart sounds and intact distal pulses.   No murmur heard. Pulmonary/Chest: Effort normal and breath sounds normal. No respiratory distress. He has no wheezes. He has no rales.  Lungs largely clear  Lymphadenopathy:    He has no cervical adenopathy.  Skin: Skin is warm and dry. No rash noted.  Nursing note and vitals reviewed.  Results for orders placed or performed in visit on 08/15/16  TSH  Result Value Ref Range   TSH 2.25 0.35 - 4.50 uIU/mL   Lab Results  Component Value Date   HGBA1C 6.9 (H) 07/11/2016       Assessment & Plan:   Problem List Items Addressed This  Visit    Acute bronchitis - Primary    Anticipate acute viral bronchitis after initial sinusitis. Completed extended augmentin course (21 days total) earlier this month and sinus symptoms fully resolved. No signs of bacterial infection at this time. rec continued supportive care with fluids and rest, discussed OTC diabetic cough syrup and will Rx tussionex which pt tolerates well for night time use only.  Cautioned with sugar content in tussionex.       Type 2 diabetes mellitus, uncontrolled, with retinopathy ([place])       Follow up plan: Return if symptoms worsen or fail to improve.  [name], MD

## 2016-10-13 NOTE — Patient Instructions (Signed)
Lungs sound ok today. May continue tussionex cough syrup at night time , use over the counter diabetic cough syrup during day.  If fever >101, worsening productive cough, let me know.

## 2016-10-13 NOTE — Assessment & Plan Note (Addendum)
Anticipate acute viral bronchitis after initial sinusitis. Completed extended augmentin course (21 days total) earlier this month and sinus symptoms fully resolved. No signs of bacterial infection at this time. rec continued supportive care with fluids and rest, discussed OTC diabetic cough syrup and will Rx tussionex which pt tolerates well for night time use only.  Cautioned with sugar content in tussionex.

## 2016-11-08 ENCOUNTER — Encounter: Payer: Self-pay | Admitting: Internal Medicine

## 2016-11-08 ENCOUNTER — Ambulatory Visit (INDEPENDENT_AMBULATORY_CARE_PROVIDER_SITE_OTHER): Payer: Medicare Other

## 2016-11-08 ENCOUNTER — Other Ambulatory Visit: Payer: Self-pay | Admitting: Family Medicine

## 2016-11-08 VITALS — BP 132/70 | HR 66 | Temp 97.7°F | Ht 70.0 in | Wt 333.5 lb

## 2016-11-08 DIAGNOSIS — Z125 Encounter for screening for malignant neoplasm of prostate: Secondary | ICD-10-CM

## 2016-11-08 DIAGNOSIS — E113559 Type 2 diabetes mellitus with stable proliferative diabetic retinopathy, unspecified eye: Secondary | ICD-10-CM

## 2016-11-08 DIAGNOSIS — Z0000 Encounter for general adult medical examination without abnormal findings: Secondary | ICD-10-CM

## 2016-11-08 DIAGNOSIS — E1165 Type 2 diabetes mellitus with hyperglycemia: Secondary | ICD-10-CM

## 2016-11-08 DIAGNOSIS — D696 Thrombocytopenia, unspecified: Secondary | ICD-10-CM

## 2016-11-08 DIAGNOSIS — E039 Hypothyroidism, unspecified: Secondary | ICD-10-CM

## 2016-11-08 DIAGNOSIS — Z794 Long term (current) use of insulin: Secondary | ICD-10-CM

## 2016-11-08 DIAGNOSIS — Z23 Encounter for immunization: Secondary | ICD-10-CM

## 2016-11-08 DIAGNOSIS — K7581 Nonalcoholic steatohepatitis (NASH): Secondary | ICD-10-CM | POA: Diagnosis not present

## 2016-11-08 DIAGNOSIS — K7460 Unspecified cirrhosis of liver: Secondary | ICD-10-CM

## 2016-11-08 DIAGNOSIS — E782 Mixed hyperlipidemia: Secondary | ICD-10-CM | POA: Diagnosis not present

## 2016-11-08 LAB — CBC WITH DIFFERENTIAL/PLATELET
BASOS PCT: 0.5 % (ref 0.0–3.0)
Basophils Absolute: 0 10*3/uL (ref 0.0–0.1)
EOS ABS: 0.1 10*3/uL (ref 0.0–0.7)
EOS PCT: 2.5 % (ref 0.0–5.0)
HEMATOCRIT: 41.5 % (ref 39.0–52.0)
HEMOGLOBIN: 13.7 g/dL (ref 13.0–17.0)
LYMPHS PCT: 14.1 % (ref 12.0–46.0)
Lymphs Abs: 0.7 10*3/uL (ref 0.7–4.0)
MCHC: 33 g/dL (ref 30.0–36.0)
MCV: 81.3 fl (ref 78.0–100.0)
MONOS PCT: 8.6 % (ref 3.0–12.0)
Monocytes Absolute: 0.5 10*3/uL (ref 0.1–1.0)
Neutro Abs: 3.9 10*3/uL (ref 1.4–7.7)
Neutrophils Relative %: 74.3 % (ref 43.0–77.0)
Platelets: 160 10*3/uL (ref 150.0–400.0)
RBC: 5.1 Mil/uL (ref 4.22–5.81)
RDW: 14.7 % (ref 11.5–15.5)
WBC: 5.3 10*3/uL (ref 4.0–10.5)

## 2016-11-08 LAB — LIPID PANEL
CHOLESTEROL: 171 mg/dL (ref 0–200)
HDL: 50.5 mg/dL (ref >39.00)
LDL Cholesterol: 89 mg/dL (ref 0–99)
NONHDL: 120.85
Total CHOL/HDL Ratio: 3
Triglycerides: 158 mg/dL — ABNORMAL HIGH (ref 0.0–149.0)
VLDL: 31.6 mg/dL (ref 0.0–40.0)

## 2016-11-08 LAB — BASIC METABOLIC PANEL
BUN: 15 mg/dL (ref 6–23)
CHLORIDE: 99 meq/L (ref 96–112)
CO2: 27 mEq/L (ref 19–32)
CREATININE: 0.82 mg/dL (ref 0.40–1.50)
Calcium: 9.5 mg/dL (ref 8.4–10.5)
GFR: 101.28 mL/min (ref >60.00)
Glucose, Bld: 179 mg/dL — ABNORMAL HIGH (ref 70–99)
POTASSIUM: 4.1 meq/L (ref 3.5–5.1)
SODIUM: 136 meq/L (ref 135–145)

## 2016-11-08 LAB — TSH: TSH: 2.31 u[IU]/mL (ref 0.35–4.50)

## 2016-11-08 LAB — HEMOGLOBIN A1C: HEMOGLOBIN A1C: 7.3 % — AB (ref 4.6–6.5)

## 2016-11-08 LAB — PSA, MEDICARE: PSA: 1.04 ng/mL (ref 0.10–4.00)

## 2016-11-08 NOTE — Progress Notes (Signed)
PCP notes:   Health maintenance:  Eye exam - addressed; pt states copay has been barrier to having annual exams PPSV23 - postponed per PCP A1C - completed Flu vaccine - administered  Abnormal screenings:   Hearing - failed (left ear only)  Hearing Screening   125Hz  250Hz  500Hz  1000Hz  2000Hz  3000Hz  4000Hz  6000Hz  8000Hz   Right ear:           Left ear:   0 0 0  0    Comments: Hearing aid - right ear only  Mini-Cog score: 19/20  Patient concerns:   Cough - intermittent; several weeks since first occurrence  Constipation - unable to have regular bowel movements despite use of stool softener   Nurse concerns:  None  Next PCP appt:   11/17/16 @ 1130

## 2016-11-08 NOTE — Progress Notes (Signed)
Addendum created due to encounter being closed in error.
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
## 2016-11-08 NOTE — Patient Instructions (Signed)
Mr. Horger , Thank you for taking time to come for your Medicare Wellness Visit. I appreciate your ongoing commitment to your health goals. Please review the following plan we discussed and let me know if I can assist you in the future.   These are the goals we discussed: Goals    . Increase physical activity          When weather permits, I will attempt to walk at least 1 mile 2-3 days per week.        This is a list of the screening recommended for you and due dates:  Health Maintenance  Topic Date Due  . Eye exam for diabetics  11/08/2017*  . DTaP/Tdap/Td vaccine (1 - Tdap) 01/30/2021*  . Pneumococcal vaccine (2) 09/04/2021*  . Complete foot exam   02/16/2017  . Hemoglobin A1C  05/08/2017  . Colon Cancer Screening  08/30/2020  . Tetanus Vaccine  01/30/2021  . Flu Shot  Completed  .  Hepatitis C: One time screening is recommended by Center for Disease Control  (CDC) for  adults born from [birth year] through 1965.   Completed  . HIV Screening  Completed  *Topic was postponed. The date shown is not the original due date.   Preventive Care for Adults  A healthy lifestyle and preventive care can promote health and wellness. Preventive health guidelines for adults include the following key practices.  . A routine yearly physical is a good way to check with your health care provider about your health and preventive screening. It is a chance to share any concerns and updates on your health and to receive a thorough exam.  . Visit your dentist for a routine exam and preventive care every 6 months. Brush your teeth twice a day and floss once a day. Good oral hygiene prevents tooth decay and gum disease.  . The frequency of eye exams is based on your age, health, family medical history, use  of contact lenses, and other factors. Follow your health care provider's ecommendations for frequency of eye exams.  . Eat a healthy diet. Foods like vegetables, fruits, whole grains, low-fat dairy  products, and lean protein foods contain the nutrients you need without too many calories. Decrease your intake of foods high in solid fats, added sugars, and salt. Eat the right amount of calories for you. Get information about a proper diet from your health care provider, if necessary.  . Regular physical exercise is one of the most important things you can do for your health. Most adults should get at least 150 minutes of moderate-intensity exercise (any activity that increases your heart rate and causes you to sweat) each week. In addition, most adults need muscle-strengthening exercises on 2 or more days a week.  Silver Sneakers may be a benefit available to you. To determine eligibility, you may visit the website: www.silversneakers.com or contact program at (907)725-5713 Mon-Fri between 8AM-8PM.   . Maintain a healthy weight. The body mass index (BMI) is a screening tool to identify possible weight problems. It provides an estimate of body fat based on height and weight. Your health care provider can find your BMI and can help you achieve or maintain a healthy weight.   For adults 20 years and older: ? A BMI below 18.5 is considered underweight. ? A BMI of 18.5 to 24.9 is normal. ? A BMI of 25 to 29.9 is considered overweight. ? A BMI of 30 and above is considered obese.   . Maintain  normal blood lipids and cholesterol levels by exercising and minimizing your intake of saturated fat. Eat a balanced diet with plenty of fruit and vegetables. Blood tests for lipids and cholesterol should begin at age 77 and be repeated every 5 years. If your lipid or cholesterol levels are high, you are over 50, or you are at high risk for heart disease, you may need your cholesterol levels checked more frequently. Ongoing high lipid and cholesterol levels should be treated with medicines if diet and exercise are not working.  . If you smoke, find out from your health care provider how to quit. If you do not  use tobacco, please do not start.  . If you choose to drink alcohol, please do not consume more than 2 drinks per day. One drink is considered to be 12 ounces (355 mL) of beer, 5 ounces (148 mL) of wine, or 1.5 ounces (44 mL) of liquor.  . If you are 67-49 years old, ask your health care provider if you should take aspirin to prevent strokes.  . Use sunscreen. Apply sunscreen liberally and repeatedly throughout the day. You should seek shade when your shadow is shorter than you. Protect yourself by wearing long sleeves, pants, a wide-brimmed hat, and sunglasses year round, whenever you are outdoors.  . Once a month, do a whole body skin exam, using a mirror to look at the skin on your back. Tell your health care provider of new moles, moles that have irregular borders, moles that are larger than a pencil eraser, or moles that have changed in shape or color.

## 2016-11-08 NOTE — Progress Notes (Signed)
Pre visit review using our clinic review tool, if applicable. No additional management support is needed unless otherwise documented below in the visit note. 

## 2016-11-12 NOTE — Progress Notes (Signed)
I reviewed health advisor's note, was available for consultation, and agree with documentation and plan.  

## 2016-11-17 ENCOUNTER — Ambulatory Visit (INDEPENDENT_AMBULATORY_CARE_PROVIDER_SITE_OTHER): Payer: Medicare Other | Admitting: Family Medicine

## 2016-11-17 ENCOUNTER — Encounter: Payer: Self-pay | Admitting: Family Medicine

## 2016-11-17 VITALS — BP 138/72 | HR 69 | Temp 97.7°F | Wt 333.8 lb

## 2016-11-17 DIAGNOSIS — E1165 Type 2 diabetes mellitus with hyperglycemia: Secondary | ICD-10-CM

## 2016-11-17 DIAGNOSIS — D696 Thrombocytopenia, unspecified: Secondary | ICD-10-CM

## 2016-11-17 DIAGNOSIS — E66813 Obesity, class 3: Secondary | ICD-10-CM

## 2016-11-17 DIAGNOSIS — E039 Hypothyroidism, unspecified: Secondary | ICD-10-CM | POA: Diagnosis not present

## 2016-11-17 DIAGNOSIS — Z794 Long term (current) use of insulin: Secondary | ICD-10-CM | POA: Diagnosis not present

## 2016-11-17 DIAGNOSIS — H9193 Unspecified hearing loss, bilateral: Secondary | ICD-10-CM | POA: Diagnosis not present

## 2016-11-17 DIAGNOSIS — I2510 Atherosclerotic heart disease of native coronary artery without angina pectoris: Secondary | ICD-10-CM | POA: Diagnosis not present

## 2016-11-17 DIAGNOSIS — I10 Essential (primary) hypertension: Secondary | ICD-10-CM

## 2016-11-17 DIAGNOSIS — E782 Mixed hyperlipidemia: Secondary | ICD-10-CM

## 2016-11-17 DIAGNOSIS — E113559 Type 2 diabetes mellitus with stable proliferative diabetic retinopathy, unspecified eye: Secondary | ICD-10-CM

## 2016-11-17 DIAGNOSIS — Z7189 Other specified counseling: Secondary | ICD-10-CM

## 2016-11-17 NOTE — Assessment & Plan Note (Signed)
Discussed healthy lifestyle choices to achieve sustainable weight loss.

## 2016-11-17 NOTE — Assessment & Plan Note (Signed)
Chronic, stable. Continue current regimen. Encouraged ongoing efforts at weight loss.

## 2016-11-17 NOTE — Progress Notes (Signed)
BP 138/72 (BP Location: Right Arm, Patient Position: Sitting, Cuff Size: Large)   Pulse 69   Temp 97.7 F (36.5 C) (Oral)   Wt (!) 333 lb 12 oz (151.4 kg)   SpO2 97%   BMI 47.89 kg/m    CC: AMW f/u visit Subjective:    Patient ID: Chad [name], male    DOB: 05-Aug-1955, 61 y.o.   MRN: [id number]  HPI: Chad [name] is a 61 y.o. male presenting on 11/17/2016 for Annual Exam (Pt 2)   Saw Katha Cabal last week for medicare wellness visit. Note reviewed. Failed hearing screen. He does not wear his hearing aides due to recurrent ear infections. Cannot afford left ear hearing aide. Ongoing bronchitis cough that continues to improve. Ongoing constipation over last 2 weeks. Yesterday he took milk of magnesia bottle which did help. Bowel regimen - reglan TID, some OTC stool softener daily. He has not been using miralax.   Bathroom/bedroom recently remodeled due to mold S/p bilateral knee replacements, R 05/2015, L5/2018.   Preventative: COLONOSCOPY Date: 12/2014 TAs, mod diverticulosis, rpt 3 yrs ([facility]rtle) Prostate cancer screen yearly (forgot today).  Flu shot yearly Td - 01/2011  Pneumovax 2008, 2013  shingrix - discussed Discussed advanced directives, doesn't think would want prolonged life support, but will think about this.Would want wife to be HCPOA. Packet previously provided.  Seat belt use discussed Sunscreen use discussed. No changing moles on skin. 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 Stays away from fried foods.  Relevant past medical, surgical, family and social history reviewed and updated as indicated. Interim medical history since our last visit reviewed. Allergies and medications reviewed and updated. Outpatient Medications Prior to Visit  Medication Sig  Dispense Refill  . albuterol (VENTOLIN HFA) 108 (90 Base) MCG/ACT inhaler Inhale 1 puff into the lungs every 6 (six) hours as needed for wheezing or shortness of breath.    Marland Kitchen aspirin 81 MG chewable tablet Chew 162 mg by mouth daily.    . chlorpheniramine-HYDROcodone (TUSSIONEX PENNKINETIC ER) 10-8 MG/5ML SUER Take 5 mLs by mouth at bedtime as needed for cough. 140 mL 0  . Docusate Sodium (DSS) 100 MG CAPS 1 tab 2 times a day while on narcotics.  STOOL SOFTENER 60 each 0  . fluticasone (FLONASE) 50 MCG/ACT nasal spray PLACE 2 SPRAYS INTO BOTH NOSTRILS DAILY 16 g 2  . furosemide (LASIX) 20 MG tablet Take 20 mg by mouth every Monday, Wednesday, and Friday.     Marland Kitchen glimepiride (AMARYL) 2 MG tablet Take 2 tablets (4 mg total) by mouth daily with breakfast. 180 tablet 1  . HYDROcodone-acetaminophen (NORCO/VICODIN) 5-325 MG tablet Take 1-2 tablets by mouth every 4 (four) hours as needed for moderate pain. 20 tablet 0  . ibuprofen (ADVIL,MOTRIN) 200 MG tablet Take 200 mg by mouth at bedtime.     . Insulin Glargine (BASAGLAR KWIKPEN) 100 UNIT/ML SOPN Inject 0.15 mLs (15 Units total) into the skin at bedtime. 18 mL 3  . Insulin Pen Needle 31G X 6 MM MISC Use as directed for lantus solostar pen 100 each 3  . levothyroxine (SYNTHROID, LEVOTHROID) 112 MCG tablet Take 1 tablet (112 mcg total) by mouth daily. 90 tablet 3  . lisinopril (PRINIVIL,ZESTRIL) 20 MG tablet Take 1 tablet (20 mg total) by  mouth daily. 90 tablet 3  . metFORMIN (GLUCOPHAGE) 1000 MG tablet TAKE 1 TABLET BY MOUTH 2 TIMES DAILY WITH A MEAL 180 tablet 1  . methocarbamol (ROBAXIN) 500 MG tablet 1-2 tablets every 6 hours prn muscle spasms 20 tablet 0  . metoCLOPramide (REGLAN) 10 MG tablet TAKE 1 TABLET BY MOUTH 3 TIMES DAILY BEFORE MEALS 90 tablet 2  . metoprolol tartrate (LOPRESSOR) 25 MG tablet TAKE 1 TABLET BY MOUTH TWICE A DAY 60 tablet 11  . Multiple Vitamin (MULITIVITAMIN WITH MINERALS) TABS Take 1 tablet by mouth daily.    Marland Kitchen NEXIUM 40 MG  capsule Take 1 capsule (40 mg total) by mouth 2 (two) times daily. 180 capsule 3  . nitroGLYCERIN (NITROSTAT) 0.4 MG SL tablet Place 1 tablet (0.4 mg total) under the tongue every 5 (five) minutes as needed for chest pain. 60 tablet 3  . polyethylene glycol (MIRALAX / GLYCOLAX) packet 17grams in 16 oz of water twice a day until bowel movement.  LAXITIVE.  Restart if two days since last bowel movement 14 each 0  . potassium chloride (K-DUR) 10 MEQ tablet Take 10 mEq by mouth every Monday, Wednesday, and Friday.     . Red Yeast Rice 600 MG CAPS Take 1 capsule (600 mg total) by mouth 2 (two) times daily.    . vitamin E 400 UNIT capsule Take 400 Units by mouth daily.     No facility-administered medications prior to visit.      Per HPI unless specifically indicated in ROS section below Review of Systems     Objective:    BP 138/72 (BP Location: Right Arm, Patient Position: Sitting, Cuff Size: Large)   Pulse 69   Temp 97.7 F (36.5 C) (Oral)   Wt (!) 333 lb 12 oz (151.4 kg)   SpO2 97%   BMI 47.89 kg/m   Wt Readings from Last 3 Encounters:  11/17/16 (!) 333 lb 12 oz (151.4 kg)  11/08/16 (!) 333 lb 8 oz (151.3 kg)  10/13/16 (!) 340 lb 8 oz (154.4 kg)    Physical Exam  Constitutional: He is oriented to person, place, and time. He appears well-developed and well-nourished. No distress.  HENT:  Head: Normocephalic and atraumatic.  Right Ear: Tympanic membrane, external ear and ear canal normal. Decreased hearing is noted.  Left Ear: Tympanic membrane, external ear and ear canal normal. Decreased hearing is noted.  Nose: Nose normal.  Mouth/Throat: Uvula is midline, oropharynx is clear and moist and mucous membranes are normal. No oropharyngeal exudate, posterior oropharyngeal edema or posterior oropharyngeal erythema.  Does not wear hearing aides  Eyes: Pupils are equal, round, and reactive to light. Conjunctivae and EOM are normal. No scleral icterus.  Neck: Normal range of motion. Neck  supple. No thyromegaly present.  Cardiovascular: Normal rate, regular rhythm, normal heart sounds and intact distal pulses.   No murmur heard. Pulses:      Radial pulses are 2+ on the right side, and 2+ on the left side.  Pulmonary/Chest: Effort normal and breath sounds normal. No respiratory distress. He has no wheezes. He has no rales.  Abdominal: Soft. Bowel sounds are normal. He exhibits no distension and no mass. There is no tenderness. There is no rebound and no guarding.  Musculoskeletal: Normal range of motion. He exhibits edema (nonpitting).  Lymphadenopathy:    He has no cervical adenopathy.  Neurological: He is alert and oriented to person, place, and time.  CN grossly intact, station and gait intact  Skin: Skin is warm and dry. No rash noted.  Psychiatric: He has a normal mood and affect. His behavior is normal. Judgment and thought content normal.  Nursing note and vitals reviewed.  Results for orders placed or performed in visit on 11/08/16  Lipid panel  Result Value Ref Range   Cholesterol 171 0 - 200 mg/dL   Triglycerides 158.0 (H) 0.0 - 149.0 mg/dL   HDL 50.50 >39.00 mg/dL   VLDL 31.6 0.0 - 40.0 mg/dL   LDL Cholesterol 89 0 - 99 mg/dL   Total CHOL/HDL Ratio 3    NonHDL 120.85   Hemoglobin A1c  Result Value Ref Range   Hgb A1c MFr Bld 7.3 (H) 4.6 - 6.5 %  Basic metabolic panel  Result Value Ref Range   Sodium 136 135 - 145 mEq/L   Potassium 4.1 3.5 - 5.1 mEq/L   Chloride 99 96 - 112 mEq/L   CO2 27 19 - 32 mEq/L   Glucose, Bld 179 (H) 70 - 99 mg/dL   BUN 15 6 - 23 mg/dL   Creatinine, Ser 0.82 0.40 - 1.50 mg/dL   Calcium 9.5 8.4 - 10.5 mg/dL   GFR 101.28 >60.00 mL/min  CBC with Differential/Platelet  Result Value Ref Range   WBC 5.3 4.0 - 10.5 K/uL   RBC 5.10 4.22 - 5.81 Mil/uL   Hemoglobin 13.7 13.0 - 17.0 g/dL   HCT 41.5 39.0 - 52.0 %   MCV 81.3 78.0 - 100.0 fl   MCHC 33.0 30.0 - 36.0 g/dL   RDW 14.7 11.5 - 15.5 %   Platelets 160.0 150.0 - 400.0 K/uL     Neutrophils Relative % 74.3 43.0 - 77.0 %   Lymphocytes Relative 14.1 12.0 - 46.0 %   Monocytes Relative 8.6 3.0 - 12.0 %   Eosinophils Relative 2.5 0.0 - 5.0 %   Basophils Relative 0.5 0.0 - 3.0 %   Neutro Abs 3.9 1.4 - 7.7 K/uL   Lymphs Abs 0.7 0.7 - 4.0 K/uL   Monocytes Absolute 0.5 0.1 - 1.0 K/uL   Eosinophils Absolute 0.1 0.0 - 0.7 K/uL   Basophils Absolute 0.0 0.0 - 0.1 K/uL  TSH  Result Value Ref Range   TSH 2.31 0.35 - 4.50 uIU/mL  PSA, Medicare  Result Value Ref Range   PSA 1.04 0.10 - 4.00 ng/ml      Assessment & Plan:   Problem List Items Addressed This Visit    Advanced care planning/counseling discussion    Discussed advanced directives, doesn't think would want prolonged life support, but will think about this.Would want wife to be HCPOA. Packet previously provided.       Bilateral hearing loss    Has R hearing aide, but doesn't use due to recurrent external ear infections. Has seen ENT in the past. Cannot afford L hearing aide.       CAD (coronary artery disease)    Nonobstructive. Continue aspirin 114m daily. Statin intolerance.       HLD (hyperlipidemia)    Statin intolerance. Tolerating RYR 6044mBID. Continue. The 10-year ASCVD risk score (GMikey BussingC JrBrooke Bonito et al., 2013) is: 19.7%   Values used to calculate the score:     Age: [age]     Sex: Male     Is Non-Hispanic African American: No     Diabetic: Yes     Tobacco smoker: No     Systolic Blood Pressure: 13342mHg     Is BP treated: Yes  HDL Cholesterol: 50.5 mg/dL     Total Cholesterol: 171 mg/dL       HTN (hypertension)    Chronic, stable. Continue current regimen.       Hypothyroid    Chronic, stable. Continue current levothyroxine 153mg dose.       Obesity, Class III, BMI 40-49.9 (morbid obesity) ([place]    Discussed healthy lifestyle choices to achieve sustainable weight loss.       RESOLVED: Thrombocytopenia ([place]    Platelets back to normal. Will resolve.       Type 2  diabetes mellitus, uncontrolled, with retinopathy ([place] - Primary    Chronic, stable. Continue current regimen. Encouraged ongoing efforts at weight loss.           Follow up plan: Return in about 4 months (around 03/19/2017) for follow up visit.  JRia Bush MD

## 2016-11-17 NOTE — Assessment & Plan Note (Signed)
Discussed advanced directives, doesn't think would want prolonged life support, but will think about this.Would want wife to be HCPOA. Packet previously provided.

## 2016-11-17 NOTE — Assessment & Plan Note (Signed)
Chronic, stable. Continue current levothyroxine 164mg dose.

## 2016-11-17 NOTE — Assessment & Plan Note (Signed)
Platelets back to normal. Will resolve.

## 2016-11-17 NOTE — Assessment & Plan Note (Signed)
Statin intolerance. Tolerating RYR 652m BID. Continue. The 10-year ASCVD risk score (Mikey BussingDC JBrooke Bonito, et al., 2013) is: 19.7%   Values used to calculate the score:     Age: [age]years     Sex: Male     Is Non-Hispanic African American: No     Diabetic: Yes     Tobacco smoker: No     Systolic Blood Pressure: 1370mmHg     Is BP treated: Yes     HDL Cholesterol: 50.5 mg/dL     Total Cholesterol: 171 mg/dL

## 2016-11-17 NOTE — Assessment & Plan Note (Signed)
Chronic, stable. Continue current regimen. 

## 2016-11-17 NOTE — Patient Instructions (Addendum)
If interested, check with pharmacy about new 2 shot shingles series (shingrix).  Continue walking routine.  Keep working on weight loss.  Return as needed or in 4 months for diabetes follow up visit.

## 2016-11-17 NOTE — Assessment & Plan Note (Signed)
Nonobstructive. Continue aspirin 133m daily. Statin intolerance.

## 2016-11-17 NOTE — Assessment & Plan Note (Signed)
Has R hearing aide, but doesn't use due to recurrent external ear infections. Has seen ENT in the past. Cannot afford L hearing aide.

## 2016-11-24 ENCOUNTER — Other Ambulatory Visit: Payer: Self-pay | Admitting: Cardiovascular Disease

## 2016-11-30 ENCOUNTER — Other Ambulatory Visit: Payer: Self-pay | Admitting: Cardiovascular Disease

## 2016-12-01 ENCOUNTER — Telehealth: Payer: Self-pay | Admitting: *Deleted

## 2016-12-01 NOTE — Telephone Encounter (Signed)
He was doing better at visit last month. Continue 20u lantus for now.  Let's get some more sugar readings. Keep log of fasting in am and pm 2 hours after dinner, and drop off in 1 week. Any other lifestyle changes that could have worsened sugar control in the last 2 weeks?

## 2016-12-01 NOTE — Telephone Encounter (Addendum)
Spoke with pt relaying instructions and message per Dr. Darnell Level. Pt denies any lifestyle changes that would cause change in BSs.

## 2016-12-01 NOTE — Telephone Encounter (Signed)
Spoke to pts wife who states pts fasting BS was 184, and then 243 at 1325. He was originally taking 15units and was advised to increase based on what his readings were, and is now up to 20units. Wife is wanting to know if pt should continue to increase dosing by 1unit, and if so what is the maximum. If not, how he should proceed; whether it be changing his medication or increasing his dosing completely. Pls advise

## 2016-12-02 ENCOUNTER — Other Ambulatory Visit: Payer: Self-pay

## 2016-12-02 ENCOUNTER — Other Ambulatory Visit: Payer: Self-pay | Admitting: Cardiovascular Disease

## 2016-12-02 MED ORDER — FUROSEMIDE 20 MG PO TABS: 20.0000 mg | ORAL_TABLET | ORAL | 4 refills | Status: DC

## 2016-12-04 ENCOUNTER — Emergency Department: Payer: Medicare Other

## 2016-12-04 ENCOUNTER — Encounter: Payer: Self-pay | Admitting: *Deleted

## 2016-12-04 ENCOUNTER — Emergency Department
Admission: EM | Admit: 2016-12-04 | Discharge: 2016-12-04 | Disposition: A | Discharge status: Home / Self Care | Payer: Medicare Other | Attending: Emergency Medicine | Admitting: Emergency Medicine

## 2016-12-04 DIAGNOSIS — Z5321 Procedure and treatment not carried out due to patient leaving prior to being seen by health care provider: Secondary | ICD-10-CM | POA: Diagnosis not present

## 2016-12-04 DIAGNOSIS — R079 Chest pain, unspecified: Secondary | ICD-10-CM | POA: Diagnosis present

## 2016-12-04 DIAGNOSIS — R0602 Shortness of breath: Secondary | ICD-10-CM | POA: Insufficient documentation

## 2016-12-04 LAB — BASIC METABOLIC PANEL
Anion gap: 15 (ref 5–15)
BUN: 19 mg/dL (ref 6–20)
CALCIUM: 9.3 mg/dL (ref 8.9–10.3)
CO2: 21 mmol/L — AB (ref 22–32)
CREATININE: 1.15 mg/dL (ref 0.61–1.24)
Chloride: 100 mmol/L — ABNORMAL LOW (ref 101–111)
GFR calc Af Amer: >60 mL/min (ref >60)
GFR calc non Af Amer: >60 mL/min (ref >60)
GLUCOSE: 244 mg/dL — AB (ref 65–99)
Potassium: 3.7 mmol/L (ref 3.5–5.1)
Sodium: 136 mmol/L (ref 135–145)

## 2016-12-04 LAB — CBC
HCT: 40.5 % (ref 40.0–52.0)
HEMOGLOBIN: 13.5 g/dL (ref 13.0–18.0)
MCH: 26.5 pg (ref 26.0–34.0)
MCHC: 33.3 g/dL (ref 32.0–36.0)
MCV: 79.8 fL — ABNORMAL LOW (ref 80.0–100.0)
Platelets: 151 10*3/uL (ref 150–440)
RBC: 5.08 MIL/uL (ref 4.40–5.90)
RDW: 14.9 % — AB (ref 11.5–14.5)
WBC: 5.6 10*3/uL (ref 3.8–10.6)

## 2016-12-04 LAB — TROPONIN I

## 2016-12-04 MED ORDER — ALBUTEROL SULFATE (2.5 MG/3ML) 0.083% IN NEBU
5.0000 mg | INHALATION_SOLUTION | Freq: Once | RESPIRATORY_TRACT | Status: AC
  Administered 2016-12-04: 5 mg via RESPIRATORY_TRACT

## 2016-12-04 MED ORDER — ALBUTEROL SULFATE (2.5 MG/3ML) 0.083% IN NEBU
INHALATION_SOLUTION | RESPIRATORY_TRACT | Status: AC
  Filled 2016-12-04: qty 3

## 2016-12-04 NOTE — ED Triage Notes (Signed)
Patient states that he is feeling better and that he is going to leave.

## 2016-12-04 NOTE — ED Triage Notes (Signed)
Pt to ED reporting SOB that began suddenly at 1700 this evening. Pt used inhaler four time without improvement. Pt is tachypnic in triage and reports chest pain at this time. Pain is centralized. Pt reports feeling "wierd" but is able to talking in complete sentences without difficulty. Pt has hx of asthma. No cough but general malaise x couple weeks.

## 2016-12-04 NOTE — ED Triage Notes (Signed)
Wife to stat desk stating that the patient is feeling better at this time. Patient asking if he can go home. Patient encouraged to stay to see provider.

## 2016-12-06 ENCOUNTER — Other Ambulatory Visit: Payer: Self-pay | Admitting: Family Medicine

## 2016-12-06 NOTE — Telephone Encounter (Signed)
Has appt tomorrow, 12/07/16. Not sure how many refills were given previously.

## 2016-12-07 ENCOUNTER — Ambulatory Visit (INDEPENDENT_AMBULATORY_CARE_PROVIDER_SITE_OTHER): Payer: Medicare Other | Admitting: Family Medicine

## 2016-12-07 ENCOUNTER — Encounter: Payer: Self-pay | Admitting: Family Medicine

## 2016-12-07 VITALS — BP 140/70 | HR 75 | Temp 97.8°F | Wt 330.2 lb

## 2016-12-07 DIAGNOSIS — E11319 Type 2 diabetes mellitus with unspecified diabetic retinopathy without macular edema: Secondary | ICD-10-CM | POA: Diagnosis not present

## 2016-12-07 DIAGNOSIS — R079 Chest pain, unspecified: Secondary | ICD-10-CM

## 2016-12-07 DIAGNOSIS — G4733 Obstructive sleep apnea (adult) (pediatric): Secondary | ICD-10-CM | POA: Diagnosis not present

## 2016-12-07 DIAGNOSIS — R0602 Shortness of breath: Secondary | ICD-10-CM | POA: Diagnosis not present

## 2016-12-07 DIAGNOSIS — J4520 Mild intermittent asthma, uncomplicated: Secondary | ICD-10-CM | POA: Diagnosis not present

## 2016-12-07 DIAGNOSIS — E1165 Type 2 diabetes mellitus with hyperglycemia: Secondary | ICD-10-CM | POA: Diagnosis not present

## 2016-12-07 DIAGNOSIS — E1143 Type 2 diabetes mellitus with diabetic autonomic (poly)neuropathy: Secondary | ICD-10-CM

## 2016-12-07 DIAGNOSIS — I2510 Atherosclerotic heart disease of native coronary artery without angina pectoris: Secondary | ICD-10-CM | POA: Diagnosis not present

## 2016-12-07 DIAGNOSIS — K3184 Gastroparesis: Secondary | ICD-10-CM | POA: Diagnosis not present

## 2016-12-07 DIAGNOSIS — IMO0002 Reserved for concepts with insufficient information to code with codable children: Secondary | ICD-10-CM

## 2016-12-07 MED ORDER — NITROGLYCERIN 0.4 MG SL SUBL
0.4000 mg | SUBLINGUAL_TABLET | Freq: Once | SUBLINGUAL | Status: AC
  Administered 2016-12-07: 0.4 mg via SUBLINGUAL

## 2016-12-07 MED ORDER — ALBUTEROL SULFATE (2.5 MG/3ML) 0.083% IN NEBU
2.5000 mg | INHALATION_SOLUTION | Freq: Once | RESPIRATORY_TRACT | Status: AC
  Administered 2016-12-07: 2.5 mg via RESPIRATORY_TRACT

## 2016-12-07 MED ORDER — DEXAMETHASONE SODIUM PHOSPHATE 10 MG/ML IJ SOLN
10.0000 mg | Freq: Once | INTRAMUSCULAR | Status: AC
  Administered 2016-12-07: 10 mg via INTRAMUSCULAR

## 2016-12-07 MED ORDER — NITROGLYCERIN 0.4 MG SL SUBL: 0.4000 mg | SUBLINGUAL_TABLET | Freq: Once | SUBLINGUAL | Status: DC

## 2016-12-07 MED ORDER — DIAZEPAM 5 MG PO TABS: 5.0000 mg | ORAL_TABLET | Freq: Two times a day (BID) | ORAL | 0 refills | Status: DC | PRN

## 2016-12-07 MED ORDER — GI COCKTAIL ~~LOC~~
30.0000 mL | Freq: Once | ORAL | Status: AC
  Administered 2016-12-07: 30 mL via ORAL

## 2016-12-07 MED ORDER — IPRATROPIUM BROMIDE 0.02 % IN SOLN
0.5000 mg | Freq: Once | RESPIRATORY_TRACT | Status: AC
  Administered 2016-12-07: 0.5 mg via RESPIRATORY_TRACT

## 2016-12-07 NOTE — Patient Instructions (Addendum)
GI cocktail today.  Sublingual nitro today. We gave you a steroid shot today.  Albuterol/atrovent nebulization treatment today.  EKG today.  I think this may be asthma related or stress/anxiety.  May try valium tablet.  Start taking gas X tablets.  Return on Friday for recheck.

## 2016-12-07 NOTE — Progress Notes (Signed)
BP 140/70 (BP Location: Left Arm, Patient Position: Sitting, Cuff Size: Large)   Pulse 75   Temp 97.8 F (36.6 C) (Oral)   Wt (!) 330 lb 4 oz (149.8 kg)   SpO2 97%   BMI 47.39 kg/m    CC: ER f/u visit Subjective:    Patient ID: Chad [name], male    DOB: Jun 04, 1955, 61 y.o.   MRN: [id number]  HPI: Chad [name] is a 61 y.o. male presenting on 12/07/2016 for Emergency Dept follow-up ([facility] 12/04/16)   Recent ER visit Sunday 12/04/2016 with dyspnea and substernal chest pressure, EMR reviewed. No note available. EKG reviewed - RBBB with LAFB, unchanged from prior. Labs reassuring with normal troponin, glu 244. CXR showed bronchitic changes ?brochitis. Treated with albuterol with some improvement. He left before seeing provider. Dyspnea even at rest. Some coughing as well   Increasing fatigue over last 2 weeks. Increasing dyspnea and chest pressure/tightness over the last 2 weeks. He has recently had bedroom/bathroom remodeling 2 months ago - for mold in the house.   H/o mild intermittent asthma. He has been using albuterol inhaler Q2 hours. Last used 1 hr ago.   Relevant past medical, surgical, family and social history reviewed and updated as indicated. Interim medical history since our last visit reviewed. Allergies and medications reviewed and updated. Outpatient Medications Prior to Visit  Medication Sig Dispense Refill  . aspirin 81 MG chewable tablet Chew 162 mg by mouth daily.    . chlorpheniramine-HYDROcodone (TUSSIONEX PENNKINETIC ER) 10-8 MG/5ML SUER Take 5 mLs by mouth at bedtime as needed for cough. 140 mL 0  . Docusate Sodium (DSS) 100 MG CAPS 1 tab 2 times a day while on narcotics.  STOOL SOFTENER 60 each 0  . fluticasone (FLONASE) 50 MCG/ACT nasal spray PLACE 2 SPRAYS INTO BOTH NOSTRILS DAILY 16 g 2  . furosemide (LASIX) 20 MG tablet Take 1 tablet (20 mg total) by mouth every Monday, Wednesday, and Friday. 30 tablet 4  . glimepiride (AMARYL) 2 MG tablet Take  2 tablets (4 mg total) by mouth daily with breakfast. 180 tablet 1  . HYDROcodone-acetaminophen (NORCO/VICODIN) 5-325 MG tablet Take 1-2 tablets by mouth every 4 (four) hours as needed for moderate pain. 20 tablet 0  . ibuprofen (ADVIL,MOTRIN) 200 MG tablet Take 200 mg by mouth at bedtime.     . Insulin Glargine (BASAGLAR KWIKPEN) 100 UNIT/ML SOPN Inject 0.15 mLs (15 Units total) into the skin at bedtime. 18 mL 3  . Insulin Pen Needle 31G X 6 MM MISC Use as directed for lantus solostar pen 100 each 3  . levothyroxine (SYNTHROID, LEVOTHROID) 112 MCG tablet Take 1 tablet (112 mcg total) by mouth daily. 90 tablet 3  . lisinopril (PRINIVIL,ZESTRIL) 20 MG tablet Take 1 tablet (20 mg total) by mouth daily. 90 tablet 3  . metFORMIN (GLUCOPHAGE) 1000 MG tablet TAKE 1 TABLET BY MOUTH 2 TIMES DAILY WITH A MEAL 180 tablet 1  . methocarbamol (ROBAXIN) 500 MG tablet 1-2 tablets every 6 hours prn muscle spasms 20 tablet 0  . metoCLOPramide (REGLAN) 10 MG tablet TAKE 1 TABLET BY MOUTH 3 TIMES DAILY BEFORE MEALS 90 tablet 2  . metoprolol tartrate (LOPRESSOR) 25 MG tablet TAKE 1 TABLET BY MOUTH TWICE A DAY 60 tablet 11  . Multiple Vitamin (MULITIVITAMIN WITH MINERALS) TABS Take 1 tablet by mouth daily.    Marland Kitchen NEXIUM 40 MG capsule Take 1 capsule (40 mg total) by mouth 2 (two) times  daily. 180 capsule 3  . nitroGLYCERIN (NITROSTAT) 0.4 MG SL tablet Place 1 tablet (0.4 mg total) under the tongue every 5 (five) minutes as needed for chest pain. 60 tablet 3  . polyethylene glycol (MIRALAX / GLYCOLAX) packet 17grams in 16 oz of water twice a day until bowel movement.  LAXITIVE.  Restart if two days since last bowel movement 14 each 0  . potassium chloride (K-DUR) 10 MEQ tablet TAKE 1 TABLET BY MOUTH TWICE A DAY AS NEEDED 180 tablet 2  . Red Yeast Rice 600 MG CAPS Take 1 capsule (600 mg total) by mouth 2 (two) times daily.    . VENTOLIN HFA 108 (90 Base) MCG/ACT inhaler INHALE 2 PUFFS INTO THE LUNGS EVERY 6 HOURS AS NEEDED  FOR WHEEZING ORSHORTNESS OF BREATH 18 g 6  . vitamin E 400 UNIT capsule Take 400 Units by mouth daily.    . potassium chloride (K-DUR) 10 MEQ tablet Take 10 mEq by mouth every Monday, Wednesday, and Friday.      No facility-administered medications prior to visit.      Per HPI unless specifically indicated in ROS section below Review of Systems     Objective:    BP 140/70 (BP Location: Left Arm, Patient Position: Sitting, Cuff Size: Large)   Pulse 75   Temp 97.8 F (36.6 C) (Oral)   Wt (!) 330 lb 4 oz (149.8 kg)   SpO2 97%   BMI 47.39 kg/m   Wt Readings from Last 3 Encounters:  12/07/16 (!) 330 lb 4 oz (149.8 kg)  12/04/16 (!) 333 lb (151 kg)  11/17/16 (!) 333 lb 12 oz (151.4 kg)    Physical Exam  Constitutional: He appears well-developed and well-nourished. No distress.  HENT:  Mouth/Throat: Oropharynx is clear and moist. No oropharyngeal exudate.  Eyes: Pupils are equal, round, and reactive to light. Conjunctivae and EOM are normal. No scleral icterus.  Cardiovascular: Normal rate, regular rhythm, normal heart sounds and intact distal pulses.   No murmur heard. Pulmonary/Chest: Breath sounds normal. He is in respiratory distress (very short winded). He has no decreased breath sounds. He has no wheezes. He has no rhonchi. He has no rales. He exhibits no tenderness.  Hyperventilating, no obvious wheezing appreciated No reproducible chest pain to palpation of chest wall  Skin: Skin is warm and dry. No rash noted.  Psychiatric: His mood appears anxious.  Nursing note and vitals reviewed.  Results for orders placed or performed in visit on 12/07/16  D-dimer, quantitative (not at [facility])  Result Value Ref Range   D-Dimer, Quant 0.69 (H) <0.50 mcg/mL FEU  TIQ-NTM  Result Value Ref Range   QUESTION/PROBLEM:     SPECIMEN(S) RECEIVED: B    Lab Results  Component Value Date   CREATININE 1.15 12/04/2016    Ambulatory pulse ox - maintains 98% O2 sats on RA.    Assessment &  Plan:   Problem List Items Addressed This Visit    Asthma    ?of this. I did recommend return to Dr Alva Garnet for further evaluation.       Relevant Medications   dexamethasone (DECADRON) injection 10 mg (Completed)   ipratropium (ATROVENT) nebulizer solution 0.5 mg (Completed)   albuterol (PROVENTIL) (2.5 MG/3ML) 0.083% nebulizer solution 2.5 mg (Completed)   CAD (coronary artery disease)    Nonobstructive CAD by latest cath 2017, doubt current pain anginal - will check TnI.       Relevant Medications   nitroGLYCERIN (NITROSTAT) SL tablet  0.4 mg (Completed)   Chest pain - Primary    Recurrent severe chest discomfort described as pressure associated with marked dyspnea even at rest. Unclear etiology however I think this is more GI in nature as well as worsened by anxiety. He does continue nexium 62m BID and reglan 155mTID. He has recently started regular miralax use, and endorses increasing indigestion with this.  Multiple treatments in office without significant effect (GI cocktail, then SL nitro, then alb/atrovent neb). EKG overall unchanged.  Check TnI and D dimer.  CXR in ER with possible bronchitic changes - in h/o mild asthma, will also provide with dexamethasone 1039mM x1 today. This should treat any possible inflammatory component to his chest discomfort.  I recommended treating with Gas X at home as well as Rx for valium 5mg79mnt to try at home.  RTC 2 d f/u visit. Pt agrees with plan.       Relevant Medications   nitroGLYCERIN (NITROSTAT) SL tablet 0.4 mg (Completed)   gi cocktail (Maalox,Lidocaine,Donnatal) (Completed)   dexamethasone (DECADRON) injection 10 mg (Completed)   ipratropium (ATROVENT) nebulizer solution 0.5 mg (Completed)   albuterol (PROVENTIL) (2.5 MG/3ML) 0.083% nebulizer solution 2.5 mg (Completed)   Other Relevant Orders   EKG 12-Lead (Completed)   [facility])   Troponin I   Diabetic gastroparesis (HCC)     Continues reglan 10mg64m regularly.       Dyspnea   OSA (obstructive sleep apnea)    Has never been able to tolerate CPAP. Anticipate contributing to obesity and dyspnea      Type 2 diabetes mellitus, uncontrolled, with retinopathy (HCC) [place].  Log he brings was reviewed - 180-200s fasting over last 2 wks.  No changes for now, will likely need increased lantus dosing, reassess Friday at f/u.           Follow up plan: Return in about 2 days (around 12/09/2016) for follow up visit.  Chad [name]

## 2016-12-08 ENCOUNTER — Ambulatory Visit (INDEPENDENT_AMBULATORY_CARE_PROVIDER_SITE_OTHER)
Admission: RE | Admit: 2016-12-08 | Discharge: 2016-12-08 | Disposition: A | Discharge status: Home / Self Care | Payer: Medicare Other | Source: Ambulatory Visit | Attending: Family Medicine | Admitting: Family Medicine

## 2016-12-08 ENCOUNTER — Other Ambulatory Visit: Payer: Self-pay | Admitting: Family Medicine

## 2016-12-08 ENCOUNTER — Telehealth: Payer: Self-pay

## 2016-12-08 DIAGNOSIS — R0602 Shortness of breath: Secondary | ICD-10-CM

## 2016-12-08 DIAGNOSIS — Z96652 Presence of left artificial knee joint: Secondary | ICD-10-CM | POA: Diagnosis not present

## 2016-12-08 DIAGNOSIS — J45909 Unspecified asthma, uncomplicated: Secondary | ICD-10-CM | POA: Insufficient documentation

## 2016-12-08 LAB — TROPONIN I: TNIDX: 0.01 ug/L (ref 0.00–0.06)

## 2016-12-08 LAB — TIQ-NTM

## 2016-12-08 LAB — D-DIMER, QUANTITATIVE (NOT AT ARMC): D DIMER QUANT: 0.69 ug{FEU}/mL — AB (ref <0.50)

## 2016-12-08 MED ORDER — IOPAMIDOL (ISOVUE-370) INJECTION 76%
80.0000 mL | Freq: Once | INTRAVENOUS | Status: AC | PRN
  Administered 2016-12-08: 80 mL via INTRAVENOUS

## 2016-12-08 NOTE — Assessment & Plan Note (Signed)
Continues reglan 63m TID regularly.

## 2016-12-08 NOTE — Assessment & Plan Note (Addendum)
Recurrent severe chest discomfort described as pressure associated with marked dyspnea even at rest. Unclear etiology however I think this is more GI in nature as well as worsened by anxiety. He does continue nexium 75m BID and reglan 147mTID. He has recently started regular miralax use, and endorses increasing indigestion with this.  Multiple treatments in office without significant effect (GI cocktail, then SL nitro, then alb/atrovent neb). EKG overall unchanged.  Check TnI and D dimer.  CXR in ER with possible bronchitic changes - in h/o mild asthma, will also provide with dexamethasone 1036mM x1 today. This should treat any possible inflammatory component to his chest discomfort.  I recommended treating with Gas X at home as well as Rx for valium 5mg47mnt to try at home.  RTC 2 d f/u visit. Pt agrees with plan.

## 2016-12-08 NOTE — Assessment & Plan Note (Addendum)
Worsening noted recently.  Log he brings was reviewed - 180-200s fasting over last 2 wks.  No changes for now, will likely need increased lantus dosing, reassess Friday at f/u.

## 2016-12-08 NOTE — Assessment & Plan Note (Signed)
Has never been able to tolerate CPAP. Anticipate contributing to obesity and dyspnea

## 2016-12-08 NOTE — Assessment & Plan Note (Addendum)
?  of this. I did recommend return to Dr Alva Garnet for further evaluation.

## 2016-12-08 NOTE — Assessment & Plan Note (Signed)
Nonobstructive CAD by latest cath 2017, doubt current pain anginal - will check TnI.

## 2016-12-08 NOTE — Telephone Encounter (Signed)
Noted. Spoke with patient and discussed results. Will see tomorrow in office.

## 2016-12-08 NOTE — Telephone Encounter (Signed)
[facility] CT called report for PE; report is in Epic; pt waiting; Marzetta Board said had to calm pt after walking in hall. Pt was struggling to get breath but pulse ox 100%. ? Hyperventilation. Report taken to Dr Darnell Level.

## 2016-12-09 ENCOUNTER — Emergency Department: Payer: Medicare Other

## 2016-12-09 ENCOUNTER — Observation Stay
Admission: EM | Admit: 2016-12-09 | Discharge: 2016-12-11 | Disposition: A | Discharge status: Home / Self Care | Payer: Medicare Other | Attending: Internal Medicine | Admitting: Internal Medicine

## 2016-12-09 ENCOUNTER — Encounter: Payer: Self-pay | Admitting: Family Medicine

## 2016-12-09 ENCOUNTER — Encounter: Payer: Self-pay | Admitting: Emergency Medicine

## 2016-12-09 ENCOUNTER — Ambulatory Visit (INDEPENDENT_AMBULATORY_CARE_PROVIDER_SITE_OTHER): Payer: Medicare Other | Admitting: Family Medicine

## 2016-12-09 ENCOUNTER — Other Ambulatory Visit: Payer: Self-pay

## 2016-12-09 VITALS — BP 136/70 | HR 67 | Temp 97.6°F | Wt 331.0 lb

## 2016-12-09 DIAGNOSIS — E039 Hypothyroidism, unspecified: Secondary | ICD-10-CM | POA: Diagnosis present

## 2016-12-09 DIAGNOSIS — I2510 Atherosclerotic heart disease of native coronary artery without angina pectoris: Secondary | ICD-10-CM

## 2016-12-09 DIAGNOSIS — J811 Chronic pulmonary edema: Secondary | ICD-10-CM | POA: Insufficient documentation

## 2016-12-09 DIAGNOSIS — E11319 Type 2 diabetes mellitus with unspecified diabetic retinopathy without macular edema: Secondary | ICD-10-CM

## 2016-12-09 DIAGNOSIS — Z8719 Personal history of other diseases of the digestive system: Secondary | ICD-10-CM | POA: Insufficient documentation

## 2016-12-09 DIAGNOSIS — Z9884 Bariatric surgery status: Secondary | ICD-10-CM | POA: Diagnosis not present

## 2016-12-09 DIAGNOSIS — E1165 Type 2 diabetes mellitus with hyperglycemia: Secondary | ICD-10-CM | POA: Insufficient documentation

## 2016-12-09 DIAGNOSIS — K219 Gastro-esophageal reflux disease without esophagitis: Secondary | ICD-10-CM | POA: Diagnosis present

## 2016-12-09 DIAGNOSIS — J45909 Unspecified asthma, uncomplicated: Secondary | ICD-10-CM | POA: Diagnosis not present

## 2016-12-09 DIAGNOSIS — I5033 Acute on chronic diastolic (congestive) heart failure: Secondary | ICD-10-CM | POA: Diagnosis not present

## 2016-12-09 DIAGNOSIS — R0602 Shortness of breath: Secondary | ICD-10-CM

## 2016-12-09 DIAGNOSIS — K7581 Nonalcoholic steatohepatitis (NASH): Secondary | ICD-10-CM | POA: Diagnosis not present

## 2016-12-09 DIAGNOSIS — I110 Hypertensive heart disease with heart failure: Principal | ICD-10-CM | POA: Insufficient documentation

## 2016-12-09 DIAGNOSIS — H905 Unspecified sensorineural hearing loss: Secondary | ICD-10-CM | POA: Diagnosis not present

## 2016-12-09 DIAGNOSIS — I700 Atherosclerosis of aorta: Secondary | ICD-10-CM | POA: Diagnosis not present

## 2016-12-09 DIAGNOSIS — Z79899 Other long term (current) drug therapy: Secondary | ICD-10-CM | POA: Insufficient documentation

## 2016-12-09 DIAGNOSIS — Z7982 Long term (current) use of aspirin: Secondary | ICD-10-CM | POA: Diagnosis not present

## 2016-12-09 DIAGNOSIS — Z794 Long term (current) use of insulin: Secondary | ICD-10-CM | POA: Diagnosis not present

## 2016-12-09 DIAGNOSIS — E785 Hyperlipidemia, unspecified: Secondary | ICD-10-CM | POA: Diagnosis not present

## 2016-12-09 DIAGNOSIS — R0600 Dyspnea, unspecified: Secondary | ICD-10-CM | POA: Diagnosis not present

## 2016-12-09 DIAGNOSIS — E1169 Type 2 diabetes mellitus with other specified complication: Secondary | ICD-10-CM | POA: Diagnosis present

## 2016-12-09 DIAGNOSIS — G4733 Obstructive sleep apnea (adult) (pediatric): Secondary | ICD-10-CM | POA: Diagnosis present

## 2016-12-09 DIAGNOSIS — J810 Acute pulmonary edema: Secondary | ICD-10-CM | POA: Diagnosis not present

## 2016-12-09 DIAGNOSIS — I10 Essential (primary) hypertension: Secondary | ICD-10-CM | POA: Diagnosis present

## 2016-12-09 DIAGNOSIS — K7460 Unspecified cirrhosis of liver: Secondary | ICD-10-CM | POA: Diagnosis not present

## 2016-12-09 DIAGNOSIS — Z6841 Body Mass Index (BMI) 40.0 and over, adult: Secondary | ICD-10-CM | POA: Diagnosis not present

## 2016-12-09 DIAGNOSIS — I5032 Chronic diastolic (congestive) heart failure: Secondary | ICD-10-CM | POA: Diagnosis present

## 2016-12-09 DIAGNOSIS — R079 Chest pain, unspecified: Secondary | ICD-10-CM | POA: Diagnosis not present

## 2016-12-09 DIAGNOSIS — E119 Type 2 diabetes mellitus without complications: Secondary | ICD-10-CM | POA: Diagnosis not present

## 2016-12-09 DIAGNOSIS — IMO0002 Reserved for concepts with insufficient information to code with codable children: Secondary | ICD-10-CM

## 2016-12-09 LAB — CBC
HCT: 39.9 % — ABNORMAL LOW (ref 40.0–52.0)
Hemoglobin: 13.5 g/dL (ref 13.0–18.0)
MCH: 27 pg (ref 26.0–34.0)
MCHC: 33.8 g/dL (ref 32.0–36.0)
MCV: 79.8 fL — ABNORMAL LOW (ref 80.0–100.0)
PLATELETS: 144 10*3/uL — AB (ref 150–440)
RBC: 5 MIL/uL (ref 4.40–5.90)
RDW: 14.6 % — ABNORMAL HIGH (ref 11.5–14.5)
WBC: 6.8 10*3/uL (ref 3.8–10.6)

## 2016-12-09 LAB — BASIC METABOLIC PANEL
BUN: 17 mg/dL (ref 6–23)
CALCIUM: 9.1 mg/dL (ref 8.4–10.5)
CO2: 25 mEq/L (ref 19–32)
Chloride: 100 mEq/L (ref 96–112)
Creatinine, Ser: 0.97 mg/dL (ref 0.40–1.50)
GFR: 83.41 mL/min (ref >60.00)
GLUCOSE: 109 mg/dL — AB (ref 70–99)
Potassium: 3.4 mEq/L — ABNORMAL LOW (ref 3.5–5.1)
SODIUM: 138 meq/L (ref 135–145)

## 2016-12-09 LAB — COMPREHENSIVE METABOLIC PANEL
ALT: 47 U/L (ref 17–63)
ANION GAP: 14 (ref 5–15)
AST: 55 U/L — ABNORMAL HIGH (ref 15–41)
Albumin: 3.8 g/dL (ref 3.5–5.0)
Alkaline Phosphatase: 129 U/L — ABNORMAL HIGH (ref 38–126)
BUN: 19 mg/dL (ref 6–20)
CHLORIDE: 103 mmol/L (ref 101–111)
CO2: 21 mmol/L — AB (ref 22–32)
Calcium: 9 mg/dL (ref 8.9–10.3)
Creatinine, Ser: 1.26 mg/dL — ABNORMAL HIGH (ref 0.61–1.24)
GFR calc non Af Amer: 60 mL/min — ABNORMAL LOW (ref >60)
Glucose, Bld: 179 mg/dL — ABNORMAL HIGH (ref 65–99)
Potassium: 3.2 mmol/L — ABNORMAL LOW (ref 3.5–5.1)
SODIUM: 138 mmol/L (ref 135–145)
Total Bilirubin: 1.1 mg/dL (ref 0.3–1.2)
Total Protein: 6.8 g/dL (ref 6.5–8.1)

## 2016-12-09 LAB — BRAIN NATRIURETIC PEPTIDE
B NATRIURETIC PEPTIDE 5: 17 pg/mL (ref 0.0–100.0)
PRO B NATRI PEPTIDE: 17 pg/mL (ref 0.0–100.0)

## 2016-12-09 LAB — TROPONIN I: Troponin I: <0.03 ng/mL (ref <0.03)

## 2016-12-09 LAB — GLUCOSE, CAPILLARY: GLUCOSE-CAPILLARY: 250 mg/dL — AB (ref 65–99)

## 2016-12-09 MED ORDER — ONDANSETRON HCL 4 MG/2ML IJ SOLN: 4.0000 mg | Freq: Four times a day (QID) | INTRAMUSCULAR | Status: DC | PRN

## 2016-12-09 MED ORDER — IPRATROPIUM-ALBUTEROL 0.5-2.5 (3) MG/3ML IN SOLN: 3.0000 mL | RESPIRATORY_TRACT | Status: DC | PRN

## 2016-12-09 MED ORDER — MORPHINE SULFATE (PF) 2 MG/ML IV SOLN
2.0000 mg | Freq: Once | INTRAVENOUS | Status: AC
  Administered 2016-12-09: 2 mg via INTRAVENOUS
  Filled 2016-12-09: qty 1

## 2016-12-09 MED ORDER — IPRATROPIUM-ALBUTEROL 0.5-2.5 (3) MG/3ML IN SOLN
3.0000 mL | Freq: Four times a day (QID) | RESPIRATORY_TRACT | Status: DC
  Administered 2016-12-09 – 2016-12-10 (×2): 3 mL via RESPIRATORY_TRACT
  Filled 2016-12-09 (×2): qty 3

## 2016-12-09 MED ORDER — DIAZEPAM 5 MG PO TABS
5.0000 mg | ORAL_TABLET | Freq: Two times a day (BID) | ORAL | Status: DC | PRN
  Administered 2016-12-09 – 2016-12-10 (×2): 5 mg via ORAL
  Filled 2016-12-09 (×2): qty 1

## 2016-12-09 MED ORDER — FUROSEMIDE 10 MG/ML IJ SOLN
40.0000 mg | Freq: Once | INTRAMUSCULAR | Status: AC
  Administered 2016-12-09: 40 mg via INTRAVENOUS
  Filled 2016-12-09: qty 4

## 2016-12-09 MED ORDER — ACETAMINOPHEN 650 MG RE SUPP: 650.0000 mg | Freq: Four times a day (QID) | RECTAL | Status: DC | PRN

## 2016-12-09 MED ORDER — INSULIN ASPART 100 UNIT/ML ~~LOC~~ SOLN
0.0000 [IU] | Freq: Every day | SUBCUTANEOUS | Status: DC
  Administered 2016-12-09: 2 [IU] via SUBCUTANEOUS
  Filled 2016-12-09: qty 1

## 2016-12-09 MED ORDER — INSULIN ASPART 100 UNIT/ML ~~LOC~~ SOLN
0.0000 [IU] | Freq: Three times a day (TID) | SUBCUTANEOUS | Status: DC
  Administered 2016-12-10: 9 [IU] via SUBCUTANEOUS
  Filled 2016-12-09: qty 1

## 2016-12-09 MED ORDER — ONDANSETRON HCL 4 MG PO TABS: 4.0000 mg | ORAL_TABLET | Freq: Four times a day (QID) | ORAL | Status: DC | PRN

## 2016-12-09 MED ORDER — LISINOPRIL 20 MG PO TABS
20.0000 mg | ORAL_TABLET | Freq: Every day | ORAL | Status: DC
  Administered 2016-12-10 – 2016-12-11 (×2): 20 mg via ORAL
  Filled 2016-12-09 (×2): qty 1

## 2016-12-09 MED ORDER — ASPIRIN 81 MG PO CHEW
162.0000 mg | CHEWABLE_TABLET | Freq: Every day | ORAL | Status: DC
Start: 2016-12-10 — End: 2016-12-11
  Administered 2016-12-10 – 2016-12-11 (×2): 162 mg via ORAL
  Filled 2016-12-09 (×2): qty 2

## 2016-12-09 MED ORDER — ENOXAPARIN SODIUM 40 MG/0.4ML ~~LOC~~ SOLN
40.0000 mg | Freq: Two times a day (BID) | SUBCUTANEOUS | Status: DC
  Administered 2016-12-09 – 2016-12-11 (×4): 40 mg via SUBCUTANEOUS
  Filled 2016-12-09 (×4): qty 0.4

## 2016-12-09 MED ORDER — IPRATROPIUM-ALBUTEROL 0.5-2.5 (3) MG/3ML IN SOLN
3.0000 mL | Freq: Once | RESPIRATORY_TRACT | Status: AC
  Administered 2016-12-09: 3 mL via RESPIRATORY_TRACT
  Filled 2016-12-09: qty 3

## 2016-12-09 MED ORDER — METHOCARBAMOL 500 MG PO TABS
500.0000 mg | ORAL_TABLET | Freq: Four times a day (QID) | ORAL | Status: DC | PRN
  Filled 2016-12-09: qty 1

## 2016-12-09 MED ORDER — ACETAMINOPHEN 325 MG PO TABS: 650.0000 mg | ORAL_TABLET | Freq: Four times a day (QID) | ORAL | Status: DC | PRN

## 2016-12-09 MED ORDER — MORPHINE SULFATE (PF) 2 MG/ML IV SOLN: 2.0000 mg | Freq: Once | INTRAVENOUS | Status: DC

## 2016-12-09 MED ORDER — METHYLPREDNISOLONE SODIUM SUCC 125 MG IJ SOLR
125.0000 mg | Freq: Once | INTRAMUSCULAR | Status: AC
  Administered 2016-12-09: 125 mg via INTRAVENOUS
  Filled 2016-12-09: qty 2

## 2016-12-09 MED ORDER — METOPROLOL TARTRATE 25 MG PO TABS
25.0000 mg | ORAL_TABLET | Freq: Two times a day (BID) | ORAL | Status: DC
  Administered 2016-12-09 – 2016-12-11 (×4): 25 mg via ORAL
  Filled 2016-12-09 (×4): qty 1

## 2016-12-09 MED ORDER — ONDANSETRON HCL 4 MG/2ML IJ SOLN
4.0000 mg | Freq: Once | INTRAMUSCULAR | Status: AC
  Administered 2016-12-09: 4 mg via INTRAVENOUS
  Filled 2016-12-09: qty 2

## 2016-12-09 MED ORDER — BASAGLAR KWIKPEN 100 UNIT/ML ~~LOC~~ SOPN: 25.0000 [IU] | PEN_INJECTOR | Freq: Every day | SUBCUTANEOUS | 3 refills | Status: DC

## 2016-12-09 MED ORDER — METHYLPREDNISOLONE SODIUM SUCC 125 MG IJ SOLR
60.0000 mg | Freq: Four times a day (QID) | INTRAMUSCULAR | Status: DC
  Administered 2016-12-10 (×3): 60 mg via INTRAVENOUS
  Filled 2016-12-09 (×3): qty 2

## 2016-12-09 MED ORDER — HYDROCODONE-ACETAMINOPHEN 5-325 MG PO TABS
1.0000 | ORAL_TABLET | ORAL | Status: DC | PRN
  Administered 2016-12-09 – 2016-12-10 (×3): 2 via ORAL
  Administered 2016-12-10: 1 via ORAL
  Administered 2016-12-11: 2 via ORAL
  Filled 2016-12-09 (×5): qty 2

## 2016-12-09 MED ORDER — IPRATROPIUM-ALBUTEROL 20-100 MCG/ACT IN AERS: 1.0000 | INHALATION_SPRAY | Freq: Four times a day (QID) | RESPIRATORY_TRACT | 3 refills | Status: DC

## 2016-12-09 MED ORDER — LEVOTHYROXINE SODIUM 112 MCG PO TABS
112.0000 ug | ORAL_TABLET | Freq: Every day | ORAL | Status: DC
  Administered 2016-12-11: 112 ug via ORAL
  Filled 2016-12-09 (×2): qty 1

## 2016-12-09 MED ORDER — METOCLOPRAMIDE HCL 10 MG PO TABS
10.0000 mg | ORAL_TABLET | Freq: Three times a day (TID) | ORAL | Status: DC
  Administered 2016-12-10 – 2016-12-11 (×5): 10 mg via ORAL
  Filled 2016-12-09 (×5): qty 1

## 2016-12-09 MED ORDER — FUROSEMIDE 20 MG PO TABS: 20.0000 mg | ORAL_TABLET | ORAL | Status: DC
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
## 2016-12-09 NOTE — Progress Notes (Signed)
BP 136/70 (BP Location: Left Arm, Patient Position: Sitting, Cuff Size: Large)   Pulse 67   Temp 97.6 F (36.4 C) (Oral)   Wt (!) 331 lb (150.1 kg)   SpO2 98%   BMI 47.49 kg/m    CC: 2d f/u visit Subjective:    Patient ID: Chad [name], male    DOB: September 19, 1955, 61 y.o.   MRN: [id number]  HPI: MADDOX [name] is a 61 y.o. male presenting on 12/09/2016 for 2 day follow-ip (due to shortness of breath and chest pain. Sxs are worse)   See prior note for details.  Several week h/o dyspnea and chest pain s/p reassuring evaluation to date. Latest had normal rpt TnI but elevate D dimer - CTA chest was unrevealing. Last visit 2d ago he didn't have any improvement with dexamethasone IM, GI cocktail, or SL nitro. Albuterol/atrovent neb helped a little. He is using home albuterol inhaler several times a day without improvement. He has been taking valium 51m BID since Wednesday.   He feels worse - worsening dyspnea - has only been from bed to chair to bathroom at home. Cold intolerance. Lost appetite.  He finds certain smells trigger shortness of breath episode (burnt smells, hairspray, etc).  No headache, no LOC.   Pt denies possible carbon monoxide exposure.  Wife has felt fine.   After thinking, he has been working outdoors near his [employer]   Relevant past medical, surgical, family and social history reviewed and updated as indicated. Interim medical history since our last visit reviewed. Allergies and medications reviewed and updated. Outpatient Medications Prior to Visit  Medication Sig Dispense Refill  . aspirin 81 MG chewable tablet Chew 162 mg by mouth daily.    . diazepam (VALIUM) 5 MG tablet Take 1 tablet (5 mg total) by mouth every 12 (twelve) hours as needed for anxiety. 20 tablet 0  . Docusate Sodium (DSS) 100 MG CAPS 1 tab 2 times a day while on narcotics.  STOOL SOFTENER 60 each 0  . fluticasone (FLONASE) 50 MCG/ACT nasal spray PLACE 2 SPRAYS INTO BOTH NOSTRILS DAILY  16 g 2  . furosemide (LASIX) 20 MG tablet Take 1 tablet (20 mg total) by mouth every Monday, Wednesday, and Friday. 30 tablet 4  . glimepiride (AMARYL) 2 MG tablet Take 2 tablets (4 mg total) by mouth daily with breakfast. 180 tablet 1  . HYDROcodone-acetaminophen (NORCO/VICODIN) 5-325 MG tablet Take 1-2 tablets by mouth every 4 (four) hours as needed for moderate pain. 20 tablet 0  . ibuprofen (ADVIL,MOTRIN) 200 MG tablet Take 200 mg by mouth at bedtime.     . Insulin Pen Needle 31G X 6 MM MISC Use as directed for lantus solostar pen 100 each 3  . levothyroxine (SYNTHROID, LEVOTHROID) 112 MCG tablet Take 1 tablet (112 mcg total) by mouth daily. 90 tablet 3  . lisinopril (PRINIVIL,ZESTRIL) 20 MG tablet Take 1 tablet (20 mg total) by mouth daily. 90 tablet 3  . metFORMIN (GLUCOPHAGE) 1000 MG tablet TAKE 1 TABLET BY MOUTH 2 TIMES DAILY WITH A MEAL 180 tablet 1  . methocarbamol (ROBAXIN) 500 MG tablet 1-2 tablets every 6 hours prn muscle spasms 20 tablet 0  . metoCLOPramide (REGLAN) 10 MG tablet TAKE 1 TABLET BY MOUTH 3 TIMES DAILY BEFORE MEALS 90 tablet 2  . metoprolol tartrate (LOPRESSOR) 25 MG tablet TAKE 1 TABLET BY MOUTH TWICE A DAY 60 tablet 11  . Multiple Vitamin (MULITIVITAMIN WITH MINERALS) TABS Take 1 tablet by  mouth daily.    Marland Kitchen NEXIUM 40 MG capsule Take 1 capsule (40 mg total) by mouth 2 (two) times daily. 180 capsule 3  . nitroGLYCERIN (NITROSTAT) 0.4 MG SL tablet Place 1 tablet (0.4 mg total) under the tongue every 5 (five) minutes as needed for chest pain. 60 tablet 3  . polyethylene glycol (MIRALAX / GLYCOLAX) packet 17grams in 16 oz of water twice a day until bowel movement.  LAXITIVE.  Restart if two days since last bowel movement 14 each 0  . potassium chloride (K-DUR) 10 MEQ tablet TAKE 1 TABLET BY MOUTH TWICE A DAY AS NEEDED 180 tablet 2  . Red Yeast Rice 600 MG CAPS Take 1 capsule (600 mg total) by mouth 2 (two) times daily.    . VENTOLIN HFA 108 (90 Base) MCG/ACT inhaler INHALE 2  PUFFS INTO THE LUNGS EVERY 6 HOURS AS NEEDED FOR WHEEZING ORSHORTNESS OF BREATH 18 g 6  . vitamin E 400 UNIT capsule Take 400 Units by mouth daily.    . chlorpheniramine-HYDROcodone (TUSSIONEX PENNKINETIC ER) 10-8 MG/5ML SUER Take 5 mLs by mouth at bedtime as needed for cough. 140 mL 0  . Insulin Glargine (BASAGLAR KWIKPEN) 100 UNIT/ML SOPN Inject 0.15 mLs (15 Units total) into the skin at bedtime. 18 mL 3   No facility-administered medications prior to visit.      Per HPI unless specifically indicated in ROS section below Review of Systems     Objective:    BP 136/70 (BP Location: Left Arm, Patient Position: Sitting, Cuff Size: Large)   Pulse 67   Temp 97.6 F (36.4 C) (Oral)   Wt (!) 331 lb (150.1 kg)   SpO2 98%   BMI 47.49 kg/m   Wt Readings from Last 3 Encounters:  12/09/16 (!) 331 lb (150.1 kg)  12/07/16 (!) 330 lb 4 oz (149.8 kg)  12/04/16 (!) 333 lb (151 kg)    Physical Exam  Constitutional: He appears well-developed and well-nourished. No distress.  HENT:  Head: Normocephalic and atraumatic.  Cardiovascular: Normal rate, regular rhythm, normal heart sounds and intact distal pulses.   No murmur heard. Pulmonary/Chest: Effort normal and breath sounds normal. No accessory muscle usage. Tachypnea noted. No respiratory distress. He has no decreased breath sounds. He has no wheezes. He has no rhonchi. He has no rales.  Good air movement without appreciable wheezing or stridor  Musculoskeletal: He exhibits no edema.  Skin: Skin is warm and dry. No rash noted.  Psychiatric: He has a normal mood and affect.  Nursing note and vitals reviewed.  Results for orders placed or performed in visit on 94/17/40  Basic metabolic panel  Result Value Ref Range   Sodium 138 135 - 145 mEq/L   Potassium 3.4 (L) 3.5 - 5.1 mEq/L   Chloride 100 96 - 112 mEq/L   CO2 25 19 - 32 mEq/L   Glucose, Bld 109 (H) 70 - 99 mg/dL   BUN 17 6 - 23 mg/dL   Creatinine, Ser 0.97 0.40 - 1.50 mg/dL    Calcium 9.1 8.4 - 10.5 mg/dL   GFR 83.41 >60.00 mL/min  Brain natriuretic peptide  Result Value Ref Range   Pro B Natriuretic peptide (BNP) 17.0 0.0 - 100.0 pg/mL   Lab Results  Component Value Date   TSH 2.31 11/08/2016       Assessment & Plan:   Problem List Items Addressed This Visit    Dyspnea - Primary    Has previously and recently had comprehensive  GI and cardiac evaluation - unrevealing for cause of current chest pain and dyspnea.  Triggers seem to suggest reactive airway component to his dyspnea however no wheezing on exam, good air movement and O2 sat, and no improvement with steroid injection done on Wednesday points against this.  I will refer him back to [facility] for further evaluation, will try to expedite referral.  Check BNP, electrolytes, and carboxyhemoglobin today.  I reviewed indications to seek care at ER - worsening shortness of breath or any worsening chest pain.       Relevant Orders   Basic metabolic panel (Completed)   Brain natriuretic peptide (Completed)   Carboxyhemoglobin   Ambulatory referral to Pulmonology   Type 2 diabetes mellitus, uncontrolled, with retinopathy ([place])    Worsening cbg's by log he brings - will continue slow titration of basaglar to 25u daily.       Relevant Medications   Insulin Glargine (BASAGLAR KWIKPEN) 100 UNIT/ML SOPN       Follow up plan: Return if symptoms worsen or fail to improve.  [name], MD

## 2016-12-09 NOTE — ED Triage Notes (Signed)
Pt comes into the ED via [facility] from home c/o shortness of breath and chest discomfort.  Patient labored with breathing and using accessory muscles at this time.  VS stable with EMS.

## 2016-12-09 NOTE — Progress Notes (Signed)
Anticoagulation monitoring(Lovenox):  61 yo  male ordered Lovenox 40 mg Q24h  Filed Weights   12/09/16 1906  Weight: (!) 326 lb (147.9 kg)   BMI 48.12   Lab Results  Component Value Date   CREATININE 1.26 (H) 12/09/2016   CREATININE 0.97 12/09/2016   CREATININE 1.15 12/04/2016   Estimated Creatinine Clearance: 88.5 mL/min (A) (by C-G formula based on SCr of 1.26 mg/dL (H)). Hemoglobin & Hematocrit     Component Value Date/Time   HGB 13.5 12/09/2016 1918   HCT 39.9 (L) 12/09/2016 1918     Per Protocol for Patient with estCrcl > 30 ml/min and BMI > 40, will transition to Lovenox 40 mg Q12h.

## 2016-12-09 NOTE — Assessment & Plan Note (Signed)
Worsening cbg's by log he brings - will continue slow titration of basaglar to 25u daily.

## 2016-12-09 NOTE — Assessment & Plan Note (Addendum)
Has previously and recently had comprehensive GI and cardiac evaluation - unrevealing for cause of current chest pain and dyspnea.  Triggers seem to suggest reactive airway component to his dyspnea however no wheezing on exam, good air movement and O2 sat, and no improvement with steroid injection done on Wednesday points against this.  I will refer him back to [facility] for further evaluation, will try to expedite referral.  Check BNP, electrolytes, and carboxyhemoglobin today.  I reviewed indications to seek care at ER - worsening shortness of breath or any worsening chest pain.

## 2016-12-09 NOTE — Progress Notes (Signed)
Attempt to call report, RN busy, will try to call in a few mins.

## 2016-12-09 NOTE — ED Notes (Signed)
Admission MD at bedside.  

## 2016-12-09 NOTE — Patient Instructions (Addendum)
Labs today.  Use combivent in place of albuterol over the weekend.  See Bonnita Nasuti for referral for lung doctor.  If worsening over weekend, go to ER.  May continue valium 71m for now, max twice a day.
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
## 2016-12-10 ENCOUNTER — Observation Stay (HOSPITAL_BASED_OUTPATIENT_CLINIC_OR_DEPARTMENT_OTHER)
Admit: 2016-12-10 | Discharge: 2016-12-10 | Disposition: A | Discharge status: Home / Self Care | Payer: Medicare Other | Attending: Internal Medicine | Admitting: Internal Medicine

## 2016-12-10 DIAGNOSIS — I2510 Atherosclerotic heart disease of native coronary artery without angina pectoris: Secondary | ICD-10-CM | POA: Diagnosis not present

## 2016-12-10 DIAGNOSIS — R0600 Dyspnea, unspecified: Secondary | ICD-10-CM

## 2016-12-10 DIAGNOSIS — I10 Essential (primary) hypertension: Secondary | ICD-10-CM

## 2016-12-10 DIAGNOSIS — I5032 Chronic diastolic (congestive) heart failure: Secondary | ICD-10-CM

## 2016-12-10 DIAGNOSIS — I5033 Acute on chronic diastolic (congestive) heart failure: Secondary | ICD-10-CM

## 2016-12-10 DIAGNOSIS — R0602 Shortness of breath: Secondary | ICD-10-CM

## 2016-12-10 DIAGNOSIS — K7581 Nonalcoholic steatohepatitis (NASH): Secondary | ICD-10-CM | POA: Diagnosis not present

## 2016-12-10 DIAGNOSIS — E119 Type 2 diabetes mellitus without complications: Secondary | ICD-10-CM | POA: Diagnosis not present

## 2016-12-10 DIAGNOSIS — K7460 Unspecified cirrhosis of liver: Secondary | ICD-10-CM | POA: Diagnosis not present

## 2016-12-10 LAB — ECHOCARDIOGRAM COMPLETE
HEIGHTINCHES: 69 in
Weight: 5163.2 oz

## 2016-12-10 LAB — CBC
HEMATOCRIT: 39.8 % — AB (ref 40.0–52.0)
Hemoglobin: 13.5 g/dL (ref 13.0–18.0)
MCH: 27.2 pg (ref 26.0–34.0)
MCHC: 33.9 g/dL (ref 32.0–36.0)
MCV: 80.2 fL (ref 80.0–100.0)
Platelets: 146 10*3/uL — ABNORMAL LOW (ref 150–440)
RBC: 4.97 MIL/uL (ref 4.40–5.90)
RDW: 15.2 % — AB (ref 11.5–14.5)
WBC: 6.3 10*3/uL (ref 3.8–10.6)

## 2016-12-10 LAB — GLUCOSE, CAPILLARY
GLUCOSE-CAPILLARY: 372 mg/dL — AB (ref 65–99)
GLUCOSE-CAPILLARY: 365 mg/dL — AB (ref 65–99)
Glucose-Capillary: 416 mg/dL — ABNORMAL HIGH (ref 65–99)
Glucose-Capillary: 496 mg/dL — ABNORMAL HIGH (ref 65–99)
Glucose-Capillary: 405 mg/dL — ABNORMAL HIGH (ref 65–99)

## 2016-12-10 LAB — BASIC METABOLIC PANEL
Anion gap: 10 (ref 5–15)
BUN: 23 mg/dL — AB (ref 6–20)
CALCIUM: 8.8 mg/dL — AB (ref 8.9–10.3)
CO2: 24 mmol/L (ref 22–32)
Chloride: 101 mmol/L (ref 101–111)
Creatinine, Ser: 1.11 mg/dL (ref 0.61–1.24)
GFR calc Af Amer: >60 mL/min (ref >60)
GLUCOSE: 398 mg/dL — AB (ref 65–99)
POTASSIUM: 4.1 mmol/L (ref 3.5–5.1)
Sodium: 135 mmol/L (ref 135–145)

## 2016-12-10 MED ORDER — PERFLUTREN LIPID MICROSPHERE
1.0000 mL | INTRAVENOUS | Status: AC | PRN
  Administered 2016-12-10: 5 mL via INTRAVENOUS
  Filled 2016-12-10: qty 10

## 2016-12-10 MED ORDER — IPRATROPIUM-ALBUTEROL 0.5-2.5 (3) MG/3ML IN SOLN
3.0000 mL | Freq: Four times a day (QID) | RESPIRATORY_TRACT | Status: DC
  Administered 2016-12-10 – 2016-12-11 (×3): 3 mL via RESPIRATORY_TRACT
  Filled 2016-12-10 (×3): qty 3

## 2016-12-10 MED ORDER — INSULIN REGULAR HUMAN 100 UNIT/ML IJ SOLN
5.0000 [IU] | Freq: Once | INTRAMUSCULAR | Status: AC
  Administered 2016-12-10: 5 [IU] via INTRAVENOUS
  Filled 2016-12-10: qty 0.05

## 2016-12-10 MED ORDER — PREDNISONE 50 MG PO TABS
50.0000 mg | ORAL_TABLET | Freq: Every day | ORAL | Status: DC
  Administered 2016-12-11: 50 mg via ORAL
  Filled 2016-12-10: qty 1

## 2016-12-10 MED ORDER — POLYETHYLENE GLYCOL 3350 17 G PO PACK
17.0000 g | PACK | Freq: Every day | ORAL | Status: DC | PRN
  Administered 2016-12-10 – 2016-12-11 (×2): 17 g via ORAL
  Filled 2016-12-10 (×3): qty 1

## 2016-12-10 MED ORDER — FUROSEMIDE 10 MG/ML IJ SOLN
40.0000 mg | Freq: Four times a day (QID) | INTRAMUSCULAR | Status: AC
  Administered 2016-12-10 (×3): 40 mg via INTRAVENOUS
  Filled 2016-12-10 (×3): qty 4

## 2016-12-10 MED ORDER — INSULIN ASPART 100 UNIT/ML ~~LOC~~ SOLN
0.0000 [IU] | Freq: Three times a day (TID) | SUBCUTANEOUS | Status: DC
  Administered 2016-12-10: 20 [IU] via SUBCUTANEOUS
  Administered 2016-12-11: 11 [IU] via SUBCUTANEOUS
  Administered 2016-12-11: 15 [IU] via SUBCUTANEOUS
  Filled 2016-12-10 (×3): qty 1

## 2016-12-10 MED ORDER — FUROSEMIDE 10 MG/ML IJ SOLN
20.0000 mg | Freq: Two times a day (BID) | INTRAMUSCULAR | Status: DC
  Administered 2016-12-10: 20 mg via INTRAVENOUS
  Filled 2016-12-10: qty 2

## 2016-12-10 MED ORDER — ALUM & MAG HYDROXIDE-SIMETH 200-200-20 MG/5ML PO SUSP: 30.0000 mL | Freq: Four times a day (QID) | ORAL | Status: DC | PRN

## 2016-12-10 MED ORDER — INSULIN ASPART 100 UNIT/ML ~~LOC~~ SOLN
0.0000 [IU] | Freq: Three times a day (TID) | SUBCUTANEOUS | Status: DC
  Administered 2016-12-10 (×2): 20 [IU] via SUBCUTANEOUS
  Filled 2016-12-10 (×2): qty 1

## 2016-12-10 NOTE — Progress Notes (Signed)
Inpatient Diabetes Program Recommendations  AACE/ADA: New Consensus Statement on Inpatient Glycemic Control (2015)  Target Ranges:  Prepandial:   less than 140 mg/dL      Peak postprandial:   less than 180 mg/dL (1-2 hours)      Critically ill patients:  140 - 180 mg/dL  Results for Chad [name], Chad [name] (MRN [id number]) as of 12/10/2016 07:39  Ref. Range 12/09/2016 14:51 12/09/2016 19:18 12/10/2016 04:49  Glucose Latest Ref Range: 65 - 99 mg/dL 109 (H) 179 (H) 398 (H)   Results for Chad [name], Chad [name] (MRN [id number]) as of 12/10/2016 07:39  Ref. Range 12/09/2016 23:03  Glucose-Capillary Latest Ref Range: 65 - 99 mg/dL 250 (H)   Results for Chad [name], Chad [name] (MRN [id number]) as of 12/10/2016 07:39  Ref. Range 11/08/2016 10:46  Hemoglobin A1C Latest Ref Range: 4.6 - 6.5 % 7.3 (H)   Review of Glycemic Control  Diabetes history: DM2 Outpatient Diabetes medications: Basaglar 25 units QHS, Metformin 1000 mg BID, Amaryl 4 mg QAM Current orders for Inpatient glycemic control: Novolog 0-9 units TID with meals, Novolog 0-5 units TID with meals  Inpatient Diabetes Program Recommendations: Insulin - Basal: Please consider ordering Lantus 30 units daily starting now (based on 146 kg x 0.2 units). Correction (SSI): Please consider increasing Novolog correction to Moderate scale 0-15 units TID with meals. Insulin - Meal Coverage: If steroids are continued, please consider ordering Novolog 4 units TID with meals for meal coverage if patient eats at least 50% of meals.  NOTE: Noted consult for Diabetes Coordinator. Chart reviewed. Patient takes Basaglar 25 units QHS as basal insulin as an outpatient. Patient is currently ordered Solumedrol 60 mg Q6H which is contributing to hyperglycemia. Diabetes Coordinator is not on campus over the weekend but is available by pager for questions or concerns.  Thanks, Barnie Alderman, RN, MSN, CDE Diabetes Coordinator Inpatient Diabetes Program [phone] (Team Pager from 8am to  5pm)

## 2016-12-10 NOTE — Progress Notes (Signed)
[facility] [place] NAME: Chad [name]    MR#:  [id number]  DATE OF BIRTH:  09-30-1955  SUBJECTIVE:    REVIEW OF SYSTEMS:   ROS Tolerating Diet: Tolerating PT:   DRUG ALLERGIES:   Allergies  Allergen Reactions  . Nsaids Palpitations and Other (See Comments)    ACID REFLUX   . Statins Other (See Comments)    MYALGIAS, LEG CRAMPS  . Librax [Chlordiazepoxide-Clidinium] Other (See Comments)    Urinary retention   . Tessalon [Benzonatate] Other (See Comments)    Unable to swallow due to GERD - caused throat numbness  . Codeine Nausea Only  . Sulfa Drugs Cross Reactors Nausea Only    VITALS:  Blood pressure (!) 123/55, pulse 90, temperature 98.4 F (36.9 C), temperature source Oral, resp. rate (!) 24, height 5' 9"  (1.753 m), weight (!) 146.4 kg (322 lb 11.2 oz), SpO2 100 %.  PHYSICAL EXAMINATION:   Physical Exam  GENERAL:  61 y.o.-year-old patient lying in the bed with no acute distress.  EYES: Pupils equal, round, reactive to light and accommodation. No scleral icterus. Extraocular muscles intact.  HEENT: Head atraumatic, normocephalic. Oropharynx and nasopharynx clear.  NECK:  Supple, no jugular venous distention. No thyroid enlargement, no tenderness.  LUNGS: Normal breath sounds bilaterally, no wheezing, rales, rhonchi. No use of accessory muscles of respiration.  CARDIOVASCULAR: S1, S2 normal. No murmurs, rubs, or gallops.  ABDOMEN: Soft, nontender, nondistended. Bowel sounds present. No organomegaly or mass.  EXTREMITIES: No cyanosis, clubbing or edema b/l.    NEUROLOGIC: Cranial nerves II through XII are intact. No focal Motor or sensory deficits b/l.   PSYCHIATRIC:  patient is alert and oriented x 3.  SKIN: No obvious rash, lesion, or ulcer.   LABORATORY PANEL:  CBC  Recent Labs Lab 12/10/16 0449  WBC 6.3  HGB 13.5  HCT 39.8*  PLT 146*    Chemistries   Recent Labs Lab 12/09/16 1918 12/10/16 0449   NA 138 135  K 3.2* 4.1  CL 103 101  CO2 21* 24  GLUCOSE 179* 398*  BUN 19 23*  CREATININE 1.26* 1.11  CALCIUM 9.0 8.8*  AST 55*  --   ALT 47  --   ALKPHOS 129*  --   BILITOT 1.1  --    Cardiac Enzymes  Recent Labs Lab 12/09/16 1918  TROPONINI <0.03   RADIOLOGY:  Ct Angio Chest Pe W Or Wo Contrast  Result Date: 12/08/2016 CLINICAL DATA:  Shortness of breath for 2 weeks. Chronic leg swelling. EXAM: CT ANGIOGRAPHY CHEST WITH CONTRAST TECHNIQUE: Multidetector CT imaging of the chest was performed using the standard protocol during bolus administration of intravenous contrast. Multiplanar CT image reconstructions and MIPs were obtained to evaluate the vascular anatomy. CONTRAST:  80 mL Isovue 370 COMPARISON:  Chest radiograph 12/04/2016 FINDINGS: Cardiovascular: Poor contrast bolus with technically inadequate opacification of the pulmonary arteries, which degrades the sensitivity of the study.Within that limitation, the pulmonary arteries are clear to the segmental level. The main pulmonary artery is within normal limits for size. There is no CT evidence of acute right heart strain. Minimal calcific aortic atherosclerosis. Multifocal coronary artery atherosclerosis. There is a normal 3-vessel arch branching pattern. Heart size is normal, without pericardial effusion. Mediastinum/Nodes: No mediastinal, hilar or axillary lymphadenopathy. The visualized thyroid and thoracic esophageal course are unremarkable. Lungs/Pleura: 5 mm calcified granuloma in the left lung base is unchanged compared to the CT abdomen pelvis of 08/13/2015.  3 mm posterior left lower lobe nodule is also unchanged. No pleural effusion or focal consolidation. The airways are clear. Upper Abdomen: Contrast bolus timing is not optimized for evaluation of the abdominal organs. Within this limitation, the visualized organs of the upper abdomen are normal. Musculoskeletal: No chest wall abnormality. No acute or significant osseous  findings. Review of the MIP images confirms the above findings. IMPRESSION: 1. Decreased sensitivity in the setting of technically inadequate contrast bolus. Within that limitation, no central pulmonary embolus to the level of the segmental arteries. No other acute thoracic abnormality. 2. Coronary artery and aortic atherosclerosis (ICD10-I70.0). Electronically Signed   By: Ulyses Jarred M.D.   On: 12/08/2016 14:47   Dg Chest Port 1 View  Result Date: 12/09/2016 CLINICAL DATA:  Dyspnea chest discomfort today. EXAM: PORTABLE CHEST 1 VIEW COMPARISON:  12/04/2016 FINDINGS: Portable AP upright view. Mildly enlarged appearing cardiac silhouette likely due to portable technique. Mild interstitial edema. No pneumonic consolidation, effusion or pneumothorax. Chronic left sixth rib fracture with healing. No acute osseous abnormality. IMPRESSION: Mild interstitial edema.  Borderline cardiomegaly. Electronically Signed   By: Ashley Royalty M.D.   On: 12/09/2016 19:29   ASSESSMENT AND PLAN:  Chad [name]  is a 61 y.o. male who presents with several days of increasing fatigue and increasing shortness of breath. Patient states that this grew acutely worse today. He became very short of breath and tachypneic and came to the ED for evaluation. X-ray in the ED showed some mild interstitial edema.  * Acute on chronic diastolic CHF (congestive heart failure) (HCC) - IV Lasix, echocardiogram and cardiology consult -good UOP -wean to ra -consider OSA w/u as out tp.   *Type 2 diabetes mellitus, uncontrolled, with retinopathy (HCC) - sliding scale insulin and corresponding glucose checks  *  HTN (hypertension) - stable, continue home meds  * CAD (coronary artery disease) - continue home medications  *  Asthma - po steroids in Place, when necessary duo nebs  *  HLD (hyperlipidemia) - continue home meds   * GERD (gastroesophageal reflux disease) - home dose PPI   * Hypothyroid - home dose thyroid  replacement  Patient and restaurant O. Wean to room air. Discussed with patient's wife. Case discussed with Care Management/Social Worker. Management plans discussed with the patient, family and they are in agreement.  CODE STATUS: full  DVT Prophylaxis: lovenox  TOTAL TIME TAKING CARE OF THIS PATIENT: *40* minutes.  >50% time spent on counselling and coordination of care  POSSIBLE D/C IN **1 DAYS, DEPENDING ON CLINICAL CONDITION.  Note: This dictation was prepared with Dragon dictation along with smaller phrase technology. Any transcriptional errors that result from this process are unintentional.  Bearl Talarico M.D on 12/10/2016 at 1:38 PM  Between 7am to 6pm - Pager - [phone]  After 6pm go to [email or web address] - password [email or web address] [facility]  Office  [phone]  CC: Primary care physician; [name], MDPatient ID: [name], male   DOB: 1956-02-07, 61 y.o.   MRN: [id number]

## 2016-12-10 NOTE — Care Management Obs Status (Signed)
[facility] NOTIFICATION   Patient Details  Name: PAOLO OKANE MRN: [id number] Date of Birth: [birth year]   Medicare Observation Status Notification Given:  Yes    Laysa Kimmey A, RN 12/10/2016, 3:57 PM

## 2016-12-10 NOTE — Discharge Instructions (Signed)
[facility] appointment on December 19 2016 at 11:40am with Darylene Price, [place]. Please call [phone] to reschedule.
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
## 2016-12-10 NOTE — Progress Notes (Signed)
*  PRELIMINARY RESULTS* Echocardiogram 2D Echocardiogram has been performed. Definity IV Contrast was used on this study.  Chad [name] 12/10/2016, 2:44 PM

## 2016-12-11 DIAGNOSIS — G4733 Obstructive sleep apnea (adult) (pediatric): Secondary | ICD-10-CM | POA: Diagnosis not present

## 2016-12-11 DIAGNOSIS — I10 Essential (primary) hypertension: Secondary | ICD-10-CM | POA: Diagnosis not present

## 2016-12-11 DIAGNOSIS — I5033 Acute on chronic diastolic (congestive) heart failure: Secondary | ICD-10-CM | POA: Diagnosis not present

## 2016-12-11 DIAGNOSIS — I2510 Atherosclerotic heart disease of native coronary artery without angina pectoris: Secondary | ICD-10-CM | POA: Diagnosis not present

## 2016-12-11 DIAGNOSIS — E119 Type 2 diabetes mellitus without complications: Secondary | ICD-10-CM | POA: Diagnosis not present

## 2016-12-11 LAB — GLUCOSE, CAPILLARY
GLUCOSE-CAPILLARY: 339 mg/dL — AB (ref 65–99)
Glucose-Capillary: 274 mg/dL — ABNORMAL HIGH (ref 65–99)

## 2016-12-11 MED ORDER — INSULIN GLARGINE 100 UNIT/ML ~~LOC~~ SOLN
25.0000 [IU] | Freq: Every day | SUBCUTANEOUS | Status: DC
  Filled 2016-12-11: qty 0.25

## 2016-12-11 MED ORDER — METFORMIN HCL 500 MG PO TABS
1000.0000 mg | ORAL_TABLET | Freq: Two times a day (BID) | ORAL | Status: DC
Start: 2016-12-11 — End: 2016-12-11

## 2016-12-11 MED ORDER — FUROSEMIDE 20 MG PO TABS: 40.0000 mg | ORAL_TABLET | Freq: Every day | ORAL | 1 refills | Status: DC

## 2016-12-11 MED ORDER — FUROSEMIDE 40 MG PO TABS
40.0000 mg | ORAL_TABLET | Freq: Every day | ORAL | Status: DC
  Administered 2016-12-11: 40 mg via ORAL
  Filled 2016-12-11: qty 1

## 2016-12-11 MED ORDER — ADULT MULTIVITAMIN W/MINERALS CH: 1.0000 | ORAL_TABLET | Freq: Every day | ORAL | Status: DC

## 2016-12-11 MED ORDER — GLIMEPIRIDE 4 MG PO TABS: 4.0000 mg | ORAL_TABLET | Freq: Every day | ORAL | Status: DC

## 2016-12-11 MED ORDER — VITAMIN E 180 MG (400 UNIT) PO CAPS
400.0000 [IU] | ORAL_CAPSULE | Freq: Every day | ORAL | Status: DC
  Filled 2016-12-11: qty 1

## 2016-12-11 NOTE — Progress Notes (Signed)
Nutrition Brief Note  Patient identified on the Malnutrition Screening Tool (MST) Report  Wt Readings from Last 15 Encounters:  12/11/16 (!) 318 lb 1.6 oz (144.3 kg)  12/09/16 (!) 331 lb (150.1 kg)  12/07/16 (!) 330 lb 4 oz (149.8 kg)  12/04/16 (!) 333 lb (151 kg)  11/17/16 (!) 333 lb 12 oz (151.4 kg)  11/08/16 (!) 333 lb 8 oz (151.3 kg)  10/13/16 (!) 340 lb 8 oz (154.4 kg)  09/19/16 (!) 335 lb 4 oz (152.1 kg)  09/09/16 (!) 338 lb (153.3 kg)  09/07/16 (!) 338 lb (153.3 kg)  07/11/16 (!) 335 lb (152 kg)  06/08/16 (!) 325 lb (147.4 kg)  06/07/16 (!) 333 lb 12 oz (151.4 kg)  04/20/16 (!) 343 lb 8 oz (155.8 kg)  04/04/16 (!) 338 lb (153.3 kg)   Spoke with patient and wife at bedside. Patient reports he has had a decreased appetite for the past week due to difficulty breathing. He is eating approximately 2 meals per day plus occasional snacks during the day. Wife prepares meals at home. Patient reports he has been following a low-sodium diet for his CHF and they choose low-sodium foods, prepare at home without salt, and avoid eating out. He reports his UBW is 340 lbs and he has lost approximately 20 lbs over the last 3 weeks, most of which is fluid coming off, so not true weight loss. Discussed with patient that the other component of eating for CHF is fluid restriction. He reports he has never been given a fluid restriction before. Dr. Posey Pronto was in room and reports she will provide patient with a fluid restriction today. Encouraged intake of low-sodium diet and to follow fluid restriction. Also discussed weighing daily at home. Patient reports he has been doing this but was not aware why. Discussed that the purpose is to monitor for weight gain from fluid. Patient and wife report understanding and no further needs. He is eating well here.   Nutrition-Focused physical exam completed. Findings are no fat depletion, no muscle depletion, and non-pitting edema.   Patient does not meet criteria for  malnutrition at this time.  Body mass index is 46.98 kg/m. Patient meets criteria for Obesity Class III (morbid obesity) based on current BMI.   Current diet order is Heart Healthy/Carbohydrate Modified, patient is consuming approximately 75-100% of meals at this time. Labs and medications reviewed.   No nutrition interventions warranted at this time. If nutrition issues arise, please consult RD.   Willey Blade, MS, [place], LDN Office: [phone] Pager: [phone] After Hours/Weekend Pager: [phone]

## 2016-12-11 NOTE — Discharge Summary (Signed)
Chad [name] NAME: Chad [name]    MR#:  [id number]  [place]:  1955-05-25  DATE OF ADMISSION:  12/09/2016 ADMITTING PHYSICIAN: [name], MD  DATE OF DISCHARGE: 12/11/2016  PRIMARY CARE PHYSICIAN: [name], MD    ADMISSION DIAGNOSIS:  Shortness of breath [R06.02] Acute pulmonary edema ([place]) [J81.0]  DISCHARGE DIAGNOSIS:  Acute diastolic CHF with pulmonary edema--improved HTN  SECONDARY DIAGNOSIS:   Past Medical History:  Diagnosis Date  . Abnormal drug screen    innaprop negative for hydrocodone 09/2013, inapprop negative for hydrocodone and tramadol 02/2014; inappropr negative hydrocodone 03/2015  . Acute diverticulitis 08/15/2014  . Arthritis    "both hips and knees; got shots in each hip in August" (01/25/2013)  . Bone spur    L4 L5  . Bulging lumbar disc   . Diabetes mellitus without complication ([place])    no medicarions in over 2 years,wt. loss 100 lbs  . Diastolic CHF, chronic ([place]) 04/02/2012  . Diverticulosis   . Gastric bypass status for obesity 1985  . Gastritis 08/31/2015   with focal intestinal metaplasia  . GERD (gastroesophageal reflux disease)    severe, h/o gastritis and GI bleed, per pt normal EGD at [facility] 2008  . History of diabetes mellitus 1990s   with mild background retinopathy, resolved with weight loss  . HLD (hyperlipidemia)    statin caused leg cramps  . HTN (hypertension)   . Hyperglycemia glucose over 300 in last 24 hrs 07/12/2016  . Hyperplastic colon polyp 2008  . Hypothyroid   . Internal hemorrhoids   . Morbid obesity ([place])   . Narrowing of lumbar spine   . OSA (obstructive sleep apnea)    unable to use CPAP as of last try 2/2 h/o tracheostomy?  . Otomycosis of right ear 07/06/2011  . Primary localized osteoarthritis of left knee 06/29/2016  . PVC (premature ventricular contraction)    RBBB Infer axis  . Right ear pain    s/p eval by ENT - thought TMJ referred pain  and sent to oral surg for dental splint  . Seasonal allergies   . Sensorineural hearing loss, bilateral    hearing aides  . Splenomegaly   . Thrombocytopenia ([place]) 06/10/2015  . Thrombocytopenia ([place]) 06/10/2015   Platelet count dropped to 73 post op day 2 after total knee   . Tinnitus    due to sensorineural hearing loss R>L with ETD  . Trifascicular block  RBBB/LPFB/1AVB     HOSPITAL COURSE:   CalvinRaganis a 61 y.o.malewho presents with several days of increasing fatigue and increasing shortness of breath. Patient states that this grew acutely worse today. He became very short of breath and tachypneic and came to the ED for evaluation. X-ray in the ED showed some mild interstitial edema.  * Acute  diastolic CHF (congestive heart failure) (HCC) -Recieved IV Lasix--change to po lasix 40 mg qd -echocardiogram  EF 55-60% - cardiology consult with Dr [name] appreciated -good UOP >4.5liters  -weaned to ra -consider OSA w/u as out tp.   *Type 2 diabetes mellitus, uncontrolled, with retinopathy (HCC) - sliding scale insulin and corresponding glucose checks  *HTN (hypertension) - stable, continue home meds  * CAD (coronary artery disease) - continue home medications  *Asthma - po steroids in Place, when necessary duo nebs  *HLD (hyperlipidemia) - continue home meds  *GERD (gastroesophageal reflux disease) - home dose PPI  *Hypothyroid - home dose thyroid replacement  Overall feels better D/c to home with outpt f/u with Dr [name]  CONSULTS OBTAINED:  Treatment Team:  [name], MD  DRUG ALLERGIES:   Allergies  Allergen Reactions  . Nsaids Palpitations and Other (See Comments)    ACID REFLUX   . Statins Other (See Comments)    MYALGIAS, LEG CRAMPS  . Librax [Chlordiazepoxide-Clidinium] Other (See Comments)    Urinary retention   . Tessalon [Benzonatate] Other (See Comments)    Unable to swallow due to GERD - caused throat numbness  .  Codeine Nausea Only  . Sulfa Drugs Cross Reactors Nausea Only    DISCHARGE MEDICATIONS:   Current Discharge Medication List    CONTINUE these medications which have CHANGED   Details  furosemide (LASIX) 20 MG tablet Take 2 tablets (40 mg total) by mouth daily. Qty: 30 tablet, Refills: 1      CONTINUE these medications which have NOT CHANGED   Details  aspirin 81 MG chewable tablet Chew 162 mg by mouth daily.    diazepam (VALIUM) 5 MG tablet Take 1 tablet (5 mg total) by mouth every 12 (twelve) hours as needed for anxiety. Qty: 20 tablet, Refills: 0    Docusate Sodium (DSS) 100 MG CAPS 1 tab 2 times a day while on narcotics.  STOOL SOFTENER Qty: 60 each, Refills: 0    fluticasone (FLONASE) 50 MCG/ACT nasal spray PLACE 2 SPRAYS INTO BOTH NOSTRILS DAILY Qty: 16 g, Refills: 2    glimepiride (AMARYL) 2 MG tablet Take 2 tablets (4 mg total) by mouth daily with breakfast. Qty: 180 tablet, Refills: 1    HYDROcodone-acetaminophen (NORCO/VICODIN) 5-325 MG tablet Take 1-2 tablets by mouth every 4 (four) hours as needed for moderate pain. Qty: 20 tablet, Refills: 0    Hydrocodone-Chlorpheniramine (CHLORPHENIRAMINE-HYDROCODONE PO) Take 5 mLs by mouth at bedtime as needed (cough). 10-8 mg/ 5 ml    ibuprofen (ADVIL,MOTRIN) 200 MG tablet Take 200 mg by mouth at bedtime.     Insulin Glargine (BASAGLAR KWIKPEN) 100 UNIT/ML SOPN Inject 0.25 mLs (25 Units total) into the skin at bedtime. Qty: 18 mL, Refills: 3    Insulin Pen Needle 31G X 6 MM MISC Use as directed for lantus solostar pen Qty: 100 each, Refills: 3    Ipratropium-Albuterol (COMBIVENT RESPIMAT) 20-100 MCG/ACT AERS respimat Inhale 1 puff into the lungs every 6 (six) hours. Qty: 4 g, Refills: 3    levothyroxine (SYNTHROID, LEVOTHROID) 112 MCG tablet Take 1 tablet (112 mcg total) by mouth daily. Qty: 90 tablet, Refills: 3    lisinopril (PRINIVIL,ZESTRIL) 20 MG tablet Take 1 tablet (20 mg total) by mouth daily. Qty: 90 tablet,  Refills: 3    metFORMIN (GLUCOPHAGE) 1000 MG tablet TAKE 1 TABLET BY MOUTH 2 TIMES DAILY WITH A MEAL Qty: 180 tablet, Refills: 1    methocarbamol (ROBAXIN) 500 MG tablet 1-2 tablets every 6 hours prn muscle spasms Qty: 20 tablet, Refills: 0    metoCLOPramide (REGLAN) 10 MG tablet TAKE 1 TABLET BY MOUTH 3 TIMES DAILY BEFORE MEALS Qty: 90 tablet, Refills: 2    metoprolol tartrate (LOPRESSOR) 25 MG tablet TAKE 1 TABLET BY MOUTH TWICE A DAY Qty: 60 tablet, Refills: 11    Multiple Vitamin (MULITIVITAMIN WITH MINERALS) TABS Take 1 tablet by mouth daily.    NEXIUM 40 MG capsule Take 1 capsule (40 mg total) by mouth 2 (two) times daily. Qty: 180 capsule, Refills: 3    nitroGLYCERIN (NITROSTAT) 0.4 MG SL tablet Place 1 tablet (0.4  mg total) under the tongue every 5 (five) minutes as needed for chest pain. Qty: 60 tablet, Refills: 3    polyethylene glycol (MIRALAX / GLYCOLAX) packet 17grams in 16 oz of water twice a day until bowel movement.  LAXITIVE.  Restart if two days since last bowel movement Qty: 14 each, Refills: 0    potassium chloride (K-DUR) 10 MEQ tablet TAKE 1 TABLET BY MOUTH TWICE A DAY AS NEEDED Qty: 180 tablet, Refills: 2    Red Yeast Rice 600 MG CAPS Take 1 capsule (600 mg total) by mouth 2 (two) times daily.    VENTOLIN HFA 108 (90 Base) MCG/ACT inhaler INHALE 2 PUFFS INTO THE LUNGS EVERY 6 HOURS AS NEEDED FOR WHEEZING ORSHORTNESS OF BREATH Qty: 18 g, Refills: 6    vitamin E 400 UNIT capsule Take 400 Units by mouth daily.        If you experience worsening of your admission symptoms, develop shortness of breath, life threatening emergency, suicidal or homicidal thoughts you must seek medical attention immediately by calling 911 or calling your MD immediately  if symptoms less severe.  You Must read complete instructions/literature along with all the possible adverse reactions/side effects for all the Medicines you take and that have been prescribed to you. Take any new  Medicines after you have completely understood and accept all the possible adverse reactions/side effects.   Please note  You were cared for by a hospitalist during your hospital stay. If you have any questions about your discharge medications or the care you received while you were in the hospital after you are discharged, you can call the unit and asked to speak with the hospitalist on call if the hospitalist that took care of you is not available. Once you are discharged, your primary care physician will handle any further medical issues. Please note that NO REFILLS for any discharge medications will be authorized once you are discharged, as it is imperative that you return to your primary care physician (or establish a relationship with a primary care physician if you do not have one) for your aftercare needs so that they can reassess your need for medications and monitor your lab values. Today   SUBJECTIVE    Doing well VITAL SIGNS:  Blood pressure (!) 154/62, pulse 92, temperature 97.6 F (36.4 C), temperature source Oral, resp. rate (!) 22, height 5' 9"  (1.753 m), weight (!) 144.3 kg (318 lb 1.6 oz), SpO2 98 %.  I/O:   Intake/Output Summary (Last 24 hours) at 12/11/16 1120 Last data filed at 12/11/16 1015  Gross per 24 hour  Intake             1200 ml  Output             2750 ml  Net            -1550 ml    PHYSICAL EXAMINATION:  GENERAL:  61 y.o.-year-old patient lying in the bed with no acute distress. obese EYES: Pupils equal, round, reactive to light and accommodation. No scleral icterus. Extraocular muscles intact.  HEENT: Head atraumatic, normocephalic. Oropharynx and nasopharynx clear.  NECK:  Supple, no jugular venous distention. No thyroid enlargement, no tenderness.  LUNGS: Normal breath sounds bilaterally, no wheezing, rales,rhonchi or crepitation. No use of accessory muscles of respiration.  CARDIOVASCULAR: S1, S2 normal. No murmurs, rubs, or gallops.  ABDOMEN: Soft,  non-tender, non-distended. Bowel sounds present. No organomegaly or mass.  EXTREMITIES: No pedal edema, cyanosis, or clubbing.  NEUROLOGIC:  Cranial nerves II through XII are intact. Muscle strength 5/5 in all extremities. Sensation intact. Gait not checked.  PSYCHIATRIC: The patient is alert and oriented x 3.  SKIN: No obvious rash, lesion, or ulcer.   DATA REVIEW:   CBC   Recent Labs Lab 12/10/16 0449  WBC 6.3  HGB 13.5  HCT 39.8*  PLT 146*    Chemistries   Recent Labs Lab 12/09/16 1918 12/10/16 0449  NA 138 135  K 3.2* 4.1  CL 103 101  CO2 21* 24  GLUCOSE 179* 398*  BUN 19 23*  CREATININE 1.26* 1.11  CALCIUM 9.0 8.8*  AST 55*  --   ALT 47  --   ALKPHOS 129*  --   BILITOT 1.1  --     Microbiology Results   No results found for this or any previous visit (from the past 240 hour(s)).  RADIOLOGY:  Dg Chest Port 1 View  Result Date: 12/09/2016 CLINICAL DATA:  Dyspnea chest discomfort today. EXAM: PORTABLE CHEST 1 VIEW COMPARISON:  12/04/2016 FINDINGS: Portable AP upright view. Mildly enlarged appearing cardiac silhouette likely due to portable technique. Mild interstitial edema. No pneumonic consolidation, effusion or pneumothorax. Chronic left sixth rib fracture with healing. No acute osseous abnormality. IMPRESSION: Mild interstitial edema.  Borderline cardiomegaly. Electronically Signed   By: Ashley Royalty M.D.   On: 12/09/2016 19:29     Management plans discussed with the patient, family and they are in agreement.  CODE STATUS:     Code Status Orders        Start     Ordered   12/09/16 2300  Full code  Continuous     12/09/16 2259    Code Status History    Date Active Date Inactive Code Status Order ID Comments User Context   07/11/2016  1:22 PM 07/13/2016  3:30 PM Full Code [id number]  Matthew Saras, PA-C Inpatient   03/24/2015 10:10 AM 03/24/2015  9:49 PM Full Code [id number]  Martinique, Peter M, MD Inpatient   03/22/2015  1:36 PM 03/24/2015 10:10 AM  Full Code [id number]  Janece Canterbury, MD Inpatient   01/25/2013  7:22 PM 01/29/2013  2:16 PM Full Code [id number]  Rai, Vernelle Emerald, MD Inpatient      TOTAL TIME TAKING CARE OF THIS PATIENT: *40* minutes.    Von Inscoe M.D on 12/11/2016 at 11:20 AM  Between 7am to 6pm - Pager - [phone] After 6pm go to [email or web address] - password [email or web address] [place] [facility]  Office  [phone]  CC: Primary care physician; [name], MD

## 2016-12-11 NOTE — Progress Notes (Signed)
CBG 405, Dr. Jannifer Franklin notified new orders placed.

## 2016-12-11 NOTE — Progress Notes (Signed)
Pt instructed on discharge instructions and medications and verbalized understanding, IV catheters discontinued, tips intact, telemetry monitor removed, pt left ambulating in stable condition with no s/s of distress or discomfort.  Harlene Ramus, RN

## 2016-12-11 NOTE — Progress Notes (Signed)
Inpatient Diabetes Program Recommendations  AACE/ADA: New Consensus Statement on Inpatient Glycemic Control (2015)  Target Ranges:  Prepandial:   less than 140 mg/dL      Peak postprandial:   less than 180 mg/dL (1-2 hours)      Critically ill patients:  140 - 180 mg/dL   Results for Chad [name], Chad [name] (MRN [id number]) as of 12/11/2016 09:49  Ref. Range 12/10/2016 07:57 12/10/2016 12:46 12/10/2016 17:00 12/10/2016 21:07 12/10/2016 22:45 12/11/2016 07:48  Glucose-Capillary Latest Ref Range: 65 - 99 mg/dL 416 (H)  Novolog 9 units 496 (H)  Novolog 20 units 372 (H)  Novolog 20 units 405 (H)  Novolog 20 units 365 (H)  Regular 5 units 274 (H)  Novolog 4 units   Review of Glycemic Control  Diabetes history: DM2 Outpatient Diabetes medications: Basaglar 25 units QHS, Metformin 1000 mg BID, Amaryl 4 mg QAM Current orders for Inpatient glycemic control: Novolog 0-20 units TID with meals, Novolog 0-5 units TID with meals  Inpatient Diabetes Program Recommendations: Insulin - Basal: Please consider ordering Lantus 25 units daily starting now. Insulin - Meal Coverage: If steroids are continued, please consider ordering Novolog 4 units TID with meals for meal coverage if patient eats at least 50% of meals.  NOTE: Noted consult for Diabetes Coordinator. Chart reviewed. Patient takes Basaglar 25 units QHS as basal insulin as an outpatient. Patient was ordered Solumedrol 60 mg Q6H which was changed to Prednisone 50 mg QAM on 12/10/16. Steroids are contributing to hyperglycemia. On 12/10/16, glucose ranged from 372-496 mg/dl and patient received a total of Novolog 69 units and Regular 5 units for correction. Recommend ordering basal insulin and Novolog meal coverage insulin as noted above.  Diabetes Coordinator is not on campus over the weekend but is available by pager for questions or concerns.  Thanks, Barnie Alderman, RN, MSN, CDE Diabetes Coordinator Inpatient Diabetes Program [phone] (Team  Pager from 8am to 5pm)

## 2016-12-11 NOTE — Progress Notes (Signed)
Progress Note  Patient Name: Chad [name] Date of Encounter: 12/11/2016  Requesting MD: Fritzi Mandes, MD  Primary Care Provider: [name], MD Primary Cardiologist: [name]  Subjective   Overall he feels a lot better today.  Not as short of breath.  He did diurese quite well.  He has had to walk around in the hallway.   Inpatient Medications    Scheduled Meds: . aspirin  162 mg Oral Daily  . enoxaparin (LOVENOX) injection  40 mg Subcutaneous Q12H  . furosemide  40 mg Oral QAC breakfast  . [START ON 12/12/2016] glimepiride  4 mg Oral Q breakfast  . insulin aspart  0-20 Units Subcutaneous TID AC & HS  . insulin glargine  25 Units Subcutaneous QHS  . ipratropium-albuterol  3 mL Inhalation Q6H  . levothyroxine  112 mcg Oral QAC breakfast  . lisinopril  20 mg Oral Daily  . metFORMIN  1,000 mg Oral BID WC  . metoCLOPramide  10 mg Oral TID AC  . metoprolol tartrate  25 mg Oral BID  .  morphine injection  2 mg Intravenous Once  . multivitamin with minerals  1 tablet Oral Daily  . vitamin E  400 Units Oral Daily   Continuous Infusions:  PRN Meds: acetaminophen **OR** acetaminophen, alum & mag hydroxide-simeth, diazepam, HYDROcodone-acetaminophen, ipratropium-albuterol, methocarbamol, ondansetron **OR** ondansetron (ZOFRAN) IV, polyethylene glycol   Vital Signs    Vitals:   12/10/16 1440 12/10/16 1938 12/11/16 0405 12/11/16 0935  BP:  (!) 135/54 (!) 125/51 (!) 154/62  Pulse:  88 71 92  Resp:  18 18 (!) 22  Temp:  97.7 F (36.5 C) 97.6 F (36.4 C)   TempSrc:  Oral Oral   SpO2: 96% 98% 96% 98%  Weight:   (!) 318 lb 1.6 oz (144.3 kg)   Height:        Intake/Output Summary (Last 24 hours) at 12/11/16 1205 Last data filed at 12/11/16 1015  Gross per 24 hour  Intake             1200 ml  Output             2750 ml  Net            -1550 ml   Filed Weights   12/09/16 2303 12/10/16 0405 12/11/16 0405  Weight: (!) 323 lb 1.6 oz (146.6 kg) (!) 322 lb 11.2 oz  (146.4 kg) (!) 318 lb 1.6 oz (144.3 kg)    Telemetry    Sinus rhythm, 70s.  One short run of sinus tachycardia at 1:20 PM.- Personally Reviewed  ECG    n/a- Personally Reviewed  Physical Exam   GEN: No acute distress.  Morbidly obese Neck: No carotid bruit; difficult to assess JVD Cardiac:  Distant heart sounds but normal S1 and S2.  RRR, no murmurs, rubs, or gallops.  Respiratory:  Significantly reduced work of breathing.  Essentially nonlabored.  No longer has rales.  Mostly CTA B GI: Soft, nontender, non-distended morbidly obese;  MS:  Trivial lower extremity edema; No deformity. Neuro:  Nonfocal  Psych: Normal affect   Labs    Chemistry Recent Labs Lab 12/04/16 2043 12/09/16 1451 12/09/16 1918 12/10/16 0449  NA 136 138 138 135  K 3.7 3.4* 3.2* 4.1  CL 100* 100 103 101  CO2 21* 25 21* 24  GLUCOSE 244* 109* 179* 398*  BUN 19 17 19  23*  CREATININE 1.15 0.97 1.26* 1.11  CALCIUM 9.3 9.1 9.0 8.8*  PROT  --   --  6.8  --   ALBUMIN  --   --  3.8  --   AST  --   --  55*  --   ALT  --   --  47  --   ALKPHOS  --   --  129*  --   BILITOT  --   --  1.1  --   GFRNONAA >60  --  60* >60  GFRAA >60  --  >60 >60  ANIONGAP 15  --  14 10     Hematology Recent Labs Lab 12/04/16 2043 12/09/16 1918 12/10/16 0449  WBC 5.6 6.8 6.3  RBC 5.08 5.00 4.97  HGB 13.5 13.5 13.5  HCT 40.5 39.9* 39.8*  MCV 79.8* 79.8* 80.2  MCH 26.5 27.0 27.2  MCHC 33.3 33.8 33.9  RDW 14.9* 14.6* 15.2*  PLT 151 144* 146*    Cardiac Enzymes Recent Labs Lab 12/04/16 2043 12/09/16 1918  TROPONINI <0.03 <0.03   No results for input(s): TROPIPOC in the last 168 hours.   BNP Recent Labs Lab 12/09/16 1451 12/09/16 1918  BNP  --  17.0  PROBNP 17.0  --      DDimer  Recent Labs Lab 12/07/16 1716  DDIMER 0.69*     Radiology    Dg Chest Port 1 View  Result Date: 12/09/2016 CLINICAL DATA:  Dyspnea chest discomfort today. EXAM: PORTABLE CHEST 1 VIEW COMPARISON:  12/04/2016 FINDINGS:  Portable AP upright view. Mildly enlarged appearing cardiac silhouette likely due to portable technique. Mild interstitial edema. No pneumonic consolidation, effusion or pneumothorax. Chronic left sixth rib fracture with healing. No acute osseous abnormality. IMPRESSION: Mild interstitial edema.  Borderline cardiomegaly. Electronically Signed   By: Ashley Royalty M.D.   On: 12/09/2016 19:29    Cardiac Studies    2D echocardiogram 04/12/2016: Poor quality.  Technically difficult.  Normal LV size and function.  EF 60-65%.  GR 1 DD.  Patient Profile     Chad [name] is a 61 y.o. male with a hx of HFPEF, previously non-obstructive CAD on Cath & NASH related Liver Cirrhosis who is being seen today for the evaluation of ACUTE ON CHRONIC DIASTOLIC CHF EXACERBATION at the request of Fritzi Mandes, MD.  Assessment & Plan    Principal Problem:   Acute on chronic diastolic CHF (congestive heart failure) ([place]) Active Problems:   Diastolic CHF, chronic (HCC)   HTN (hypertension)   OSA (obstructive sleep apnea)   Coronary artery disease, non-occlusive   Type 2 diabetes mellitus, uncontrolled, with retinopathy ([place])   HLD (hyperlipidemia)   GERD (gastroesophageal reflux disease)   Hypothyroid   Obesity, Class III, BMI 40-49.9 (morbid obesity) ([place])   Liver cirrhosis secondary to NASH ([place])   Asthma  Aggressive diuresis with IV Lasix is net out roughly 4.2 L.  Feels better. No chest tightness or pressure with rest or exertion.  I question how much of his dyspnea is related to his obesity. Echocardiogram reviewed, essentially normal EF, and likely has some diastolic dysfunction.  Plan per hospitalist service is to discharge home today on increased dose of diuretic.  He was given 40 mg oral Lasix today. Recommendation would be for him to stay on 40 mg until his follow-up with his cardiologist.  I talked to him about sliding scale Lasix, taking an additional tablet for weight gain greater than 3 pounds in  1 day.  He should be instructed to weigh himself daily.  I would add spironolactone to his discharge regimen (if  not added, this can be done in outpatient follow-up); Can consider converting from metoprolol to carvedilol in the outpatient setting since he is intended to be discharged.  I do agree that he would benefit from OSA evaluation as an outpatient.  Provided he is able to walk around the hallway without notable dyspnea, would probably be okay for discharge today.  May consider a dose of IV Lasix before he goes home    For questions or updates, please contact [place] Please consult [email or web address] for contact info under Cardiology/STEMI.      Signed, Glenetta Hew, MD  12/11/2016, 12:05 PM

## 2016-12-12 ENCOUNTER — Telehealth: Payer: Self-pay | Admitting: Cardiovascular Disease

## 2016-12-12 NOTE — Telephone Encounter (Signed)
[facility] ph [facility] for Acute diastolic CHF with pulmonary edema--improved HTN Needs 1 wk fu  Scheduled with Otila Kluver for new chf patient appt  10/29  Scheduled with Sharolyn Douglas next available  11/14

## 2016-12-12 NOTE — Telephone Encounter (Signed)
Patient contacted regarding discharge from [facility] on 12/11/16.  Patient understands to follow up with provider Ignacia Bayley NP on 01/04/17 at 3:00PM at [facility]. Patient understands discharge instructions? Yes Patient understands medications and regiment? Yes Patient understands to bring all medications to this visit? Yes  Patient confirmed appointment information for both appointments to include appt 12/19/16 at 11:40AM in our HF clinic. He verbalized understanding with no further questions at this time.

## 2016-12-13 ENCOUNTER — Ambulatory Visit (INDEPENDENT_AMBULATORY_CARE_PROVIDER_SITE_OTHER): Payer: Medicare Other | Admitting: Family Medicine

## 2016-12-13 ENCOUNTER — Encounter: Payer: Self-pay | Admitting: Family Medicine

## 2016-12-13 VITALS — BP 136/72 | HR 66 | Temp 97.6°F | Wt 323.0 lb

## 2016-12-13 DIAGNOSIS — I5032 Chronic diastolic (congestive) heart failure: Secondary | ICD-10-CM

## 2016-12-13 DIAGNOSIS — G4733 Obstructive sleep apnea (adult) (pediatric): Secondary | ICD-10-CM | POA: Diagnosis not present

## 2016-12-13 DIAGNOSIS — I5033 Acute on chronic diastolic (congestive) heart failure: Secondary | ICD-10-CM | POA: Diagnosis not present

## 2016-12-13 DIAGNOSIS — E1165 Type 2 diabetes mellitus with hyperglycemia: Secondary | ICD-10-CM | POA: Diagnosis not present

## 2016-12-13 DIAGNOSIS — I2510 Atherosclerotic heart disease of native coronary artery without angina pectoris: Secondary | ICD-10-CM

## 2016-12-13 DIAGNOSIS — R0600 Dyspnea, unspecified: Secondary | ICD-10-CM | POA: Diagnosis not present

## 2016-12-13 DIAGNOSIS — IMO0002 Reserved for concepts with insufficient information to code with codable children: Secondary | ICD-10-CM

## 2016-12-13 DIAGNOSIS — E11319 Type 2 diabetes mellitus with unspecified diabetic retinopathy without macular edema: Secondary | ICD-10-CM | POA: Diagnosis not present

## 2016-12-13 MED ORDER — FUROSEMIDE 40 MG PO TABS: 40.0000 mg | ORAL_TABLET | Freq: Every day | ORAL | 3 refills | Status: DC

## 2016-12-13 NOTE — Assessment & Plan Note (Signed)
Reviewed continued lantus titration. Encouraged ongoing walking regimen.

## 2016-12-13 NOTE — Patient Instructions (Addendum)
I'm glad you're doing so much better.  Continue lasix 54m daily. Continue titrating lantus.  Return for follow up diabetes after 02/07/2017.

## 2016-12-13 NOTE — Progress Notes (Signed)
BP 136/72 (BP Location: Left Arm, Patient Position: Sitting, Cuff Size: Large)   Pulse 66   Temp 97.6 F (36.4 C) (Oral)   Wt (!) 323 lb (146.5 kg)   SpO2 96%   BMI 47.70 kg/m    CC: hosp f/u visit Subjective:    Patient ID: Chad [name], male    DOB: 03-Feb-1956, 60 y.o.   MRN: [id number]  HPI: Chad [name] is a 61 y.o. male presenting on 12/13/2016 for Hospitalization Follow-up ([place] d/c 12/11/16)   Recent hospitalization for diastolic CHF exacerbation despite stable echo and BNP. Treated with IV lasix, oral dose was increased to 53m daily. Diuresed 4.2L in the hospital. Down 13 lbs, has regained 5 since last visit. He is making good urine with current lasix dose.   OSA - has not tolerated CPAP in the past. Strong feelings against CPAP. Advised to keep an open mind.   DM - fasting this morning 168. He is increasing lantus and up to 23u daily.   TCM f/u scheduled with CHF clinic 12/19/2016 and with cardiology for 01/04/2017.   Admission date: 12/09/2016 Discharge date: 12/11/2016  D/C dx: Acute diastolic CHF with pulmonary edema--improved HTN  Relevant past medical, surgical, family and social history reviewed and updated as indicated. Interim medical history since our last visit reviewed. Allergies and medications reviewed and updated. Outpatient Medications Prior to Visit  Medication Sig Dispense Refill  . aspirin 81 MG chewable tablet Chew 162 mg by mouth daily.    .Mariane BaumgartenSodium (DSS) 100 MG CAPS 1 tab 2 times a day while on narcotics.  STOOL SOFTENER (Patient taking differently: Take 100 mg by mouth 2 (two) times daily. ) 60 each 0  . fluticasone (FLONASE) 50 MCG/ACT nasal spray PLACE 2 SPRAYS INTO BOTH NOSTRILS DAILY 16 g 2  . glimepiride (AMARYL) 2 MG tablet Take 2 tablets (4 mg total) by mouth daily with breakfast. 180 tablet 1  . HYDROcodone-acetaminophen (NORCO/VICODIN) 5-325 MG tablet Take 1-2 tablets by mouth every 4 (four) hours as needed for moderate  pain. 20 tablet 0  . Hydrocodone-Chlorpheniramine (CHLORPHENIRAMINE-HYDROCODONE PO) Take 5 mLs by mouth at bedtime as needed (cough). 10-8 mg/ 5 ml    . ibuprofen (ADVIL,MOTRIN) 200 MG tablet Take 200 mg by mouth at bedtime.     . Insulin Glargine (BASAGLAR KWIKPEN) 100 UNIT/ML SOPN Inject 0.25 mLs (25 Units total) into the skin at bedtime. 18 mL 3  . Insulin Pen Needle 31G X 6 MM MISC Use as directed for lantus solostar pen 100 each 3  . Ipratropium-Albuterol (COMBIVENT RESPIMAT) 20-100 MCG/ACT AERS respimat Inhale 1 puff into the lungs every 6 (six) hours. 4 g 3  . levothyroxine (SYNTHROID, LEVOTHROID) 112 MCG tablet Take 1 tablet (112 mcg total) by mouth daily. 90 tablet 3  . lisinopril (PRINIVIL,ZESTRIL) 20 MG tablet Take 1 tablet (20 mg total) by mouth daily. 90 tablet 3  . metFORMIN (GLUCOPHAGE) 1000 MG tablet TAKE 1 TABLET BY MOUTH 2 TIMES DAILY WITH A MEAL 180 tablet 1  . methocarbamol (ROBAXIN) 500 MG tablet 1-2 tablets every 6 hours prn muscle spasms (Patient taking differently: Take 500-1,000 mg by mouth every 6 (six) hours as needed for muscle spasms. ) 20 tablet 0  . metoCLOPramide (REGLAN) 10 MG tablet TAKE 1 TABLET BY MOUTH 3 TIMES DAILY BEFORE MEALS 90 tablet 2  . metoprolol tartrate (LOPRESSOR) 25 MG tablet TAKE 1 TABLET BY MOUTH TWICE A DAY 60 tablet 11  . Multiple  Vitamin (MULITIVITAMIN WITH MINERALS) TABS Take 1 tablet by mouth daily.    Marland Kitchen NEXIUM 40 MG capsule Take 1 capsule (40 mg total) by mouth 2 (two) times daily. 180 capsule 3  . nitroGLYCERIN (NITROSTAT) 0.4 MG SL tablet Place 1 tablet (0.4 mg total) under the tongue every 5 (five) minutes as needed for chest pain. 60 tablet 3  . polyethylene glycol (MIRALAX / GLYCOLAX) packet 17grams in 16 oz of water twice a day until bowel movement.  LAXITIVE.  Restart if two days since last bowel movement 14 each 0  . potassium chloride (K-DUR) 10 MEQ tablet TAKE 1 TABLET BY MOUTH TWICE A DAY AS NEEDED (Patient taking differently: TAKE 1  TABLET BY MOUTH TWICE A DAY on Monday, Wednesday, Friday with Furosemide) 180 tablet 2  . Red Yeast Rice 600 MG CAPS Take 1 capsule (600 mg total) by mouth 2 (two) times daily.    . VENTOLIN HFA 108 (90 Base) MCG/ACT inhaler INHALE 2 PUFFS INTO THE LUNGS EVERY 6 HOURS AS NEEDED FOR WHEEZING ORSHORTNESS OF BREATH 18 g 6  . vitamin E 400 UNIT capsule Take 400 Units by mouth daily.    . diazepam (VALIUM) 5 MG tablet Take 1 tablet (5 mg total) by mouth every 12 (twelve) hours as needed for anxiety. 20 tablet 0  . furosemide (LASIX) 20 MG tablet Take 2 tablets (40 mg total) by mouth daily. 30 tablet 1   No facility-administered medications prior to visit.      Per HPI unless specifically indicated in ROS section below Review of Systems     Objective:    BP 136/72 (BP Location: Left Arm, Patient Position: Sitting, Cuff Size: Large)   Pulse 66   Temp 97.6 F (36.4 C) (Oral)   Wt (!) 323 lb (146.5 kg)   SpO2 96%   BMI 47.70 kg/m   Wt Readings from Last 3 Encounters:  12/13/16 (!) 323 lb (146.5 kg)  12/11/16 (!) 318 lb 1.6 oz (144.3 kg)  12/09/16 (!) 331 lb (150.1 kg)    Physical Exam  Constitutional: He appears well-developed and well-nourished. No distress.  HENT:  Mouth/Throat: Oropharynx is clear and moist. No oropharyngeal exudate.  Cardiovascular: Normal rate, regular rhythm, normal heart sounds and intact distal pulses.   No murmur heard. Pulmonary/Chest: Effort normal and breath sounds normal. No respiratory distress. He has no wheezes. He has no rales.  Musculoskeletal: He exhibits no edema.  Skin: Skin is warm and dry. No rash noted.  Psychiatric: He has a normal mood and affect.  Nursing note and vitals reviewed.     Assessment & Plan:   Problem List Items Addressed This Visit    Acute on chronic diastolic CHF (congestive heart failure) (HCC) - Primary    Significant improvement with diuresis and on higher lasix dose 48m daily. He endorses good UOP. Hospital records  reviewed. He has scheduled f/u with CHF clinic and f/u with cardiology.       Relevant Medications   furosemide (LASIX) 40 MG tablet   Diastolic CHF, chronic (HCC) (Chronic)   Relevant Medications   furosemide (LASIX) 40 MG tablet   Dyspnea    Improved with diuresis, appreciate CHF clinic are.       OSA (obstructive sleep apnea) (Chronic)    Adamant about not wanting CPAP mask. Encouraged to consider updated sleep study and discuss further management options.      Type 2 diabetes mellitus, uncontrolled, with retinopathy ([place]    Reviewed  continued lantus titration. Encouraged ongoing walking regimen.           Follow up plan: No Follow-up on file.  [name], MD

## 2016-12-13 NOTE — Assessment & Plan Note (Signed)
Significant improvement with diuresis and on higher lasix dose 72m daily. He endorses good UOP. Hospital records reviewed. He has scheduled f/u with CHF clinic and f/u with cardiology.

## 2016-12-13 NOTE — Assessment & Plan Note (Signed)
Improved with diuresis, appreciate CHF clinic are.

## 2016-12-13 NOTE — Assessment & Plan Note (Addendum)
Adamant about not wanting CPAP mask. Encouraged to consider updated sleep study and discuss further management options.

## 2016-12-14 LAB — CARBOXYHEMOGLOBIN: Carboxyhemoglobin: NOT DETECTED %TOTAL HGB (ref <=12)

## 2016-12-19 ENCOUNTER — Encounter: Payer: Self-pay | Admitting: Family

## 2016-12-19 ENCOUNTER — Ambulatory Visit: Payer: Medicare Other | Attending: Family | Admitting: Family

## 2016-12-19 VITALS — BP 160/77 | HR 78 | Resp 18 | Wt 322.1 lb

## 2016-12-19 DIAGNOSIS — Z9889 Other specified postprocedural states: Secondary | ICD-10-CM | POA: Insufficient documentation

## 2016-12-19 DIAGNOSIS — Z9884 Bariatric surgery status: Secondary | ICD-10-CM | POA: Insufficient documentation

## 2016-12-19 DIAGNOSIS — Z8249 Family history of ischemic heart disease and other diseases of the circulatory system: Secondary | ICD-10-CM | POA: Insufficient documentation

## 2016-12-19 DIAGNOSIS — Z888 Allergy status to other drugs, medicaments and biological substances status: Secondary | ICD-10-CM | POA: Insufficient documentation

## 2016-12-19 DIAGNOSIS — Z886 Allergy status to analgesic agent status: Secondary | ICD-10-CM | POA: Insufficient documentation

## 2016-12-19 DIAGNOSIS — R0602 Shortness of breath: Secondary | ICD-10-CM | POA: Diagnosis not present

## 2016-12-19 DIAGNOSIS — I4510 Unspecified right bundle-branch block: Secondary | ICD-10-CM | POA: Insufficient documentation

## 2016-12-19 DIAGNOSIS — K219 Gastro-esophageal reflux disease without esophagitis: Secondary | ICD-10-CM | POA: Diagnosis not present

## 2016-12-19 DIAGNOSIS — Z882 Allergy status to sulfonamides status: Secondary | ICD-10-CM | POA: Insufficient documentation

## 2016-12-19 DIAGNOSIS — H905 Unspecified sensorineural hearing loss: Secondary | ICD-10-CM | POA: Diagnosis not present

## 2016-12-19 DIAGNOSIS — R42 Dizziness and giddiness: Secondary | ICD-10-CM | POA: Diagnosis not present

## 2016-12-19 DIAGNOSIS — I10 Essential (primary) hypertension: Secondary | ICD-10-CM

## 2016-12-19 DIAGNOSIS — I110 Hypertensive heart disease with heart failure: Secondary | ICD-10-CM | POA: Diagnosis not present

## 2016-12-19 DIAGNOSIS — E119 Type 2 diabetes mellitus without complications: Secondary | ICD-10-CM | POA: Diagnosis not present

## 2016-12-19 DIAGNOSIS — F419 Anxiety disorder, unspecified: Secondary | ICD-10-CM | POA: Diagnosis not present

## 2016-12-19 DIAGNOSIS — Z801 Family history of malignant neoplasm of trachea, bronchus and lung: Secondary | ICD-10-CM | POA: Insufficient documentation

## 2016-12-19 DIAGNOSIS — Z823 Family history of stroke: Secondary | ICD-10-CM | POA: Diagnosis not present

## 2016-12-19 DIAGNOSIS — E785 Hyperlipidemia, unspecified: Secondary | ICD-10-CM | POA: Diagnosis not present

## 2016-12-19 DIAGNOSIS — E1142 Type 2 diabetes mellitus with diabetic polyneuropathy: Secondary | ICD-10-CM

## 2016-12-19 DIAGNOSIS — Z833 Family history of diabetes mellitus: Secondary | ICD-10-CM | POA: Insufficient documentation

## 2016-12-19 DIAGNOSIS — Z794 Long term (current) use of insulin: Secondary | ICD-10-CM | POA: Insufficient documentation

## 2016-12-19 DIAGNOSIS — R531 Weakness: Secondary | ICD-10-CM | POA: Diagnosis not present

## 2016-12-19 DIAGNOSIS — E039 Hypothyroidism, unspecified: Secondary | ICD-10-CM | POA: Insufficient documentation

## 2016-12-19 DIAGNOSIS — Z96653 Presence of artificial knee joint, bilateral: Secondary | ICD-10-CM | POA: Diagnosis not present

## 2016-12-19 DIAGNOSIS — Z79891 Long term (current) use of opiate analgesic: Secondary | ICD-10-CM | POA: Insufficient documentation

## 2016-12-19 DIAGNOSIS — I2510 Atherosclerotic heart disease of native coronary artery without angina pectoris: Secondary | ICD-10-CM | POA: Insufficient documentation

## 2016-12-19 DIAGNOSIS — G4733 Obstructive sleep apnea (adult) (pediatric): Secondary | ICD-10-CM | POA: Insufficient documentation

## 2016-12-19 DIAGNOSIS — Z808 Family history of malignant neoplasm of other organs or systems: Secondary | ICD-10-CM | POA: Insufficient documentation

## 2016-12-19 DIAGNOSIS — I5032 Chronic diastolic (congestive) heart failure: Secondary | ICD-10-CM | POA: Diagnosis not present

## 2016-12-19 DIAGNOSIS — Z79899 Other long term (current) drug therapy: Secondary | ICD-10-CM | POA: Insufficient documentation

## 2016-12-19 DIAGNOSIS — R072 Precordial pain: Secondary | ICD-10-CM

## 2016-12-19 DIAGNOSIS — Z9049 Acquired absence of other specified parts of digestive tract: Secondary | ICD-10-CM | POA: Insufficient documentation

## 2016-12-19 DIAGNOSIS — Z885 Allergy status to narcotic agent status: Secondary | ICD-10-CM | POA: Insufficient documentation

## 2016-12-19 DIAGNOSIS — Z791 Long term (current) use of non-steroidal anti-inflammatories (NSAID): Secondary | ICD-10-CM | POA: Insufficient documentation

## 2016-12-19 DIAGNOSIS — Z7982 Long term (current) use of aspirin: Secondary | ICD-10-CM | POA: Insufficient documentation

## 2016-12-19 MED ORDER — NITROGLYCERIN 0.4 MG SL SUBL: 0.4000 mg | SUBLINGUAL_TABLET | SUBLINGUAL | 3 refills | Status: DC | PRN

## 2016-12-19 NOTE — Patient Instructions (Signed)
Continue weighing daily and call for an overnight weight gain of > 2 pounds or a weekly weight gain of >5 pounds. 
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
## 2016-12-21 ENCOUNTER — Other Ambulatory Visit: Payer: Self-pay | Admitting: Family Medicine

## 2016-12-22 HISTORY — PX: PERCUTANEOUS CORONARY STENT INTERVENTION (PCI-S): SHX6016

## 2016-12-27 ENCOUNTER — Inpatient Hospital Stay
Admission: EM | Admit: 2016-12-27 | Discharge: 2016-12-29 | DRG: 303 | Disposition: A | Discharge status: Home / Self Care | Payer: Medicare Other | Attending: Internal Medicine | Admitting: Internal Medicine

## 2016-12-27 ENCOUNTER — Encounter: Payer: Self-pay | Admitting: Pulmonary Disease

## 2016-12-27 ENCOUNTER — Ambulatory Visit (INDEPENDENT_AMBULATORY_CARE_PROVIDER_SITE_OTHER): Payer: Medicare Other | Admitting: Pulmonary Disease

## 2016-12-27 ENCOUNTER — Emergency Department: Payer: Medicare Other

## 2016-12-27 ENCOUNTER — Encounter: Payer: Self-pay | Admitting: Emergency Medicine

## 2016-12-27 ENCOUNTER — Telehealth: Payer: Self-pay | Admitting: Pulmonary Disease

## 2016-12-27 ENCOUNTER — Emergency Department: Admission: EM | Admit: 2016-12-27 | Discharge: 2016-12-27 | Discharge status: Absent without leave | Payer: Self-pay

## 2016-12-27 VITALS — HR 80

## 2016-12-27 DIAGNOSIS — J45909 Unspecified asthma, uncomplicated: Secondary | ICD-10-CM | POA: Diagnosis present

## 2016-12-27 DIAGNOSIS — R0602 Shortness of breath: Secondary | ICD-10-CM

## 2016-12-27 DIAGNOSIS — R0789 Other chest pain: Secondary | ICD-10-CM | POA: Diagnosis not present

## 2016-12-27 DIAGNOSIS — E66813 Obesity, class 3: Secondary | ICD-10-CM

## 2016-12-27 DIAGNOSIS — E11319 Type 2 diabetes mellitus with unspecified diabetic retinopathy without macular edema: Secondary | ICD-10-CM | POA: Diagnosis present

## 2016-12-27 DIAGNOSIS — I200 Unstable angina: Secondary | ICD-10-CM

## 2016-12-27 DIAGNOSIS — M170 Bilateral primary osteoarthritis of knee: Secondary | ICD-10-CM | POA: Diagnosis present

## 2016-12-27 DIAGNOSIS — Z9119 Patient's noncompliance with other medical treatment and regimen: Secondary | ICD-10-CM | POA: Diagnosis not present

## 2016-12-27 DIAGNOSIS — G8929 Other chronic pain: Secondary | ICD-10-CM | POA: Diagnosis present

## 2016-12-27 DIAGNOSIS — Z882 Allergy status to sulfonamides status: Secondary | ICD-10-CM

## 2016-12-27 DIAGNOSIS — Z886 Allergy status to analgesic agent status: Secondary | ICD-10-CM

## 2016-12-27 DIAGNOSIS — R7989 Other specified abnormal findings of blood chemistry: Secondary | ICD-10-CM

## 2016-12-27 DIAGNOSIS — I25110 Atherosclerotic heart disease of native coronary artery with unstable angina pectoris: Secondary | ICD-10-CM | POA: Diagnosis present

## 2016-12-27 DIAGNOSIS — H9041 Sensorineural hearing loss, unilateral, right ear, with unrestricted hearing on the contralateral side: Secondary | ICD-10-CM | POA: Diagnosis present

## 2016-12-27 DIAGNOSIS — Z9884 Bariatric surgery status: Secondary | ICD-10-CM | POA: Diagnosis not present

## 2016-12-27 DIAGNOSIS — R778 Other specified abnormalities of plasma proteins: Secondary | ICD-10-CM

## 2016-12-27 DIAGNOSIS — Z7982 Long term (current) use of aspirin: Secondary | ICD-10-CM

## 2016-12-27 DIAGNOSIS — K219 Gastro-esophageal reflux disease without esophagitis: Secondary | ICD-10-CM | POA: Diagnosis present

## 2016-12-27 DIAGNOSIS — F419 Anxiety disorder, unspecified: Secondary | ICD-10-CM | POA: Diagnosis present

## 2016-12-27 DIAGNOSIS — M160 Bilateral primary osteoarthritis of hip: Secondary | ICD-10-CM | POA: Diagnosis present

## 2016-12-27 DIAGNOSIS — E1143 Type 2 diabetes mellitus with diabetic autonomic (poly)neuropathy: Secondary | ICD-10-CM | POA: Diagnosis present

## 2016-12-27 DIAGNOSIS — Z8679 Personal history of other diseases of the circulatory system: Secondary | ICD-10-CM

## 2016-12-27 DIAGNOSIS — I5032 Chronic diastolic (congestive) heart failure: Secondary | ICD-10-CM | POA: Diagnosis present

## 2016-12-27 DIAGNOSIS — IMO0002 Reserved for concepts with insufficient information to code with codable children: Secondary | ICD-10-CM

## 2016-12-27 DIAGNOSIS — E119 Type 2 diabetes mellitus without complications: Secondary | ICD-10-CM | POA: Diagnosis not present

## 2016-12-27 DIAGNOSIS — K7460 Unspecified cirrhosis of liver: Secondary | ICD-10-CM | POA: Diagnosis present

## 2016-12-27 DIAGNOSIS — R079 Chest pain, unspecified: Secondary | ICD-10-CM | POA: Diagnosis not present

## 2016-12-27 DIAGNOSIS — K3184 Gastroparesis: Secondary | ICD-10-CM | POA: Diagnosis present

## 2016-12-27 DIAGNOSIS — E039 Hypothyroidism, unspecified: Secondary | ICD-10-CM | POA: Diagnosis present

## 2016-12-27 DIAGNOSIS — G4733 Obstructive sleep apnea (adult) (pediatric): Secondary | ICD-10-CM | POA: Diagnosis present

## 2016-12-27 DIAGNOSIS — E1165 Type 2 diabetes mellitus with hyperglycemia: Secondary | ICD-10-CM

## 2016-12-27 DIAGNOSIS — T39395A Adverse effect of other nonsteroidal anti-inflammatory drugs [NSAID], initial encounter: Secondary | ICD-10-CM | POA: Diagnosis present

## 2016-12-27 DIAGNOSIS — I110 Hypertensive heart disease with heart failure: Secondary | ICD-10-CM | POA: Diagnosis present

## 2016-12-27 DIAGNOSIS — Z888 Allergy status to other drugs, medicaments and biological substances status: Secondary | ICD-10-CM

## 2016-12-27 DIAGNOSIS — Z885 Allergy status to narcotic agent status: Secondary | ICD-10-CM

## 2016-12-27 DIAGNOSIS — I25118 Atherosclerotic heart disease of native coronary artery with other forms of angina pectoris: Secondary | ICD-10-CM | POA: Diagnosis not present

## 2016-12-27 DIAGNOSIS — Z79899 Other long term (current) drug therapy: Secondary | ICD-10-CM

## 2016-12-27 DIAGNOSIS — Z6841 Body Mass Index (BMI) 40.0 and over, adult: Secondary | ICD-10-CM | POA: Diagnosis not present

## 2016-12-27 DIAGNOSIS — I453 Trifascicular block: Secondary | ICD-10-CM | POA: Diagnosis present

## 2016-12-27 DIAGNOSIS — Z96653 Presence of artificial knee joint, bilateral: Secondary | ICD-10-CM | POA: Diagnosis present

## 2016-12-27 DIAGNOSIS — Z794 Long term (current) use of insulin: Secondary | ICD-10-CM

## 2016-12-27 DIAGNOSIS — I700 Atherosclerosis of aorta: Secondary | ICD-10-CM | POA: Diagnosis not present

## 2016-12-27 HISTORY — DX: Atherosclerotic heart disease of native coronary artery without angina pectoris: I25.10

## 2016-12-27 LAB — PROTIME-INR
INR: 1.05
PROTHROMBIN TIME: 13.6 s (ref 11.4–15.2)

## 2016-12-27 LAB — BASIC METABOLIC PANEL
ANION GAP: 13 (ref 5–15)
BUN: 15 mg/dL (ref 6–20)
CALCIUM: 9.1 mg/dL (ref 8.9–10.3)
CO2: 21 mmol/L — ABNORMAL LOW (ref 22–32)
Chloride: 99 mmol/L — ABNORMAL LOW (ref 101–111)
Creatinine, Ser: 0.97 mg/dL (ref 0.61–1.24)
GFR calc Af Amer: >60 mL/min (ref >60)
Glucose, Bld: 305 mg/dL — ABNORMAL HIGH (ref 65–99)
POTASSIUM: 4 mmol/L (ref 3.5–5.1)
SODIUM: 133 mmol/L — AB (ref 135–145)

## 2016-12-27 LAB — CBC
HEMATOCRIT: 43.3 % (ref 40.0–52.0)
HEMOGLOBIN: 14.3 g/dL (ref 13.0–18.0)
MCH: 26.8 pg (ref 26.0–34.0)
MCHC: 33 g/dL (ref 32.0–36.0)
MCV: 81.2 fL (ref 80.0–100.0)
Platelets: 119 10*3/uL — ABNORMAL LOW (ref 150–440)
RBC: 5.34 MIL/uL (ref 4.40–5.90)
RDW: 14.8 % — AB (ref 11.5–14.5)
WBC: 4.9 10*3/uL (ref 3.8–10.6)

## 2016-12-27 LAB — LIPID PANEL
CHOL/HDL RATIO: 4 ratio
Cholesterol: 189 mg/dL (ref 0–200)
HDL: 47 mg/dL (ref >40)
LDL CALC: 123 mg/dL — AB (ref 0–99)
Triglycerides: 97 mg/dL (ref <150)
VLDL: 19 mg/dL (ref 0–40)

## 2016-12-27 LAB — MRSA PCR SCREENING: MRSA BY PCR: NEGATIVE

## 2016-12-27 LAB — HEPARIN LEVEL (UNFRACTIONATED): HEPARIN UNFRACTIONATED: 0.11 [IU]/mL — AB (ref 0.30–0.70)

## 2016-12-27 LAB — TROPONIN I

## 2016-12-27 LAB — GLUCOSE, CAPILLARY
GLUCOSE-CAPILLARY: 121 mg/dL — AB (ref 65–99)
Glucose-Capillary: 166 mg/dL — ABNORMAL HIGH (ref 65–99)
Glucose-Capillary: 183 mg/dL — ABNORMAL HIGH (ref 65–99)

## 2016-12-27 LAB — BRAIN NATRIURETIC PEPTIDE: B NATRIURETIC PEPTIDE 5: 16 pg/mL (ref 0.0–100.0)

## 2016-12-27 LAB — HEMOGLOBIN A1C
HEMOGLOBIN A1C: 8.5 % — AB (ref 4.8–5.6)
MEAN PLASMA GLUCOSE: 197.25 mg/dL

## 2016-12-27 LAB — APTT: APTT: 27 s (ref 24–36)

## 2016-12-27 MED ORDER — LISINOPRIL 20 MG PO TABS
20.0000 mg | ORAL_TABLET | Freq: Every day | ORAL | Status: DC
  Administered 2016-12-29: 20 mg via ORAL
  Filled 2016-12-27 (×2): qty 1

## 2016-12-27 MED ORDER — NITROGLYCERIN IN D5W 200-5 MCG/ML-% IV SOLN
0.0000 ug/min | Freq: Once | INTRAVENOUS | Status: AC
Start: 2016-12-27 — End: 2016-12-28
  Administered 2016-12-27: 5 ug/min via INTRAVENOUS
  Filled 2016-12-27: qty 250

## 2016-12-27 MED ORDER — ASPIRIN 81 MG PO CHEW
324.0000 mg | CHEWABLE_TABLET | Freq: Once | ORAL | Status: AC
  Administered 2016-12-27: 324 mg via ORAL
  Filled 2016-12-27: qty 4

## 2016-12-27 MED ORDER — ACETAMINOPHEN 325 MG PO TABS: 650.0000 mg | ORAL_TABLET | Freq: Four times a day (QID) | ORAL | Status: DC | PRN

## 2016-12-27 MED ORDER — RED YEAST RICE 600 MG PO CAPS: 1.0000 | ORAL_CAPSULE | Freq: Two times a day (BID) | ORAL | Status: DC

## 2016-12-27 MED ORDER — METOPROLOL TARTRATE 25 MG PO TABS
25.0000 mg | ORAL_TABLET | Freq: Two times a day (BID) | ORAL | Status: DC
  Administered 2016-12-28 – 2016-12-29 (×2): 25 mg via ORAL
  Filled 2016-12-27 (×3): qty 1

## 2016-12-27 MED ORDER — ONDANSETRON HCL 4 MG/2ML IJ SOLN
INTRAMUSCULAR | Status: AC
  Administered 2016-12-27: 4 mg via INTRAMUSCULAR
  Filled 2016-12-27: qty 2

## 2016-12-27 MED ORDER — METHOCARBAMOL 500 MG PO TABS
500.0000 mg | ORAL_TABLET | Freq: Four times a day (QID) | ORAL | Status: DC | PRN
  Filled 2016-12-27: qty 2

## 2016-12-27 MED ORDER — MORPHINE SULFATE (PF) 2 MG/ML IV SOLN
INTRAVENOUS | Status: AC
  Administered 2016-12-27: 2 mg via INTRAMUSCULAR
  Filled 2016-12-27: qty 1

## 2016-12-27 MED ORDER — INSULIN GLARGINE 100 UNIT/ML ~~LOC~~ SOLN
25.0000 [IU] | Freq: Every day | SUBCUTANEOUS | Status: DC
  Administered 2016-12-27 – 2016-12-29 (×2): 25 [IU] via SUBCUTANEOUS
  Filled 2016-12-27 (×5): qty 0.25

## 2016-12-27 MED ORDER — ASPIRIN 81 MG PO CHEW
162.0000 mg | CHEWABLE_TABLET | Freq: Every day | ORAL | Status: DC
  Administered 2016-12-28 – 2016-12-29 (×2): 162 mg via ORAL
  Filled 2016-12-27 (×2): qty 2

## 2016-12-27 MED ORDER — INSULIN ASPART 100 UNIT/ML ~~LOC~~ SOLN
7.0000 [IU] | Freq: Once | SUBCUTANEOUS | Status: AC
  Administered 2016-12-27: 7 [IU] via INTRAVENOUS
  Filled 2016-12-27: qty 1

## 2016-12-27 MED ORDER — PANTOPRAZOLE SODIUM 40 MG PO TBEC
40.0000 mg | DELAYED_RELEASE_TABLET | Freq: Every day | ORAL | Status: DC
  Administered 2016-12-28: 40 mg via ORAL
  Filled 2016-12-27: qty 1

## 2016-12-27 MED ORDER — NITROGLYCERIN 0.4 MG SL SUBL: 0.4000 mg | SUBLINGUAL_TABLET | SUBLINGUAL | Status: DC | PRN

## 2016-12-27 MED ORDER — ONDANSETRON HCL 4 MG/2ML IJ SOLN
4.0000 mg | Freq: Once | INTRAMUSCULAR | Status: AC
  Administered 2016-12-27: 4 mg via INTRAVENOUS
  Filled 2016-12-27: qty 2

## 2016-12-27 MED ORDER — HEPARIN BOLUS VIA INFUSION
3000.0000 [IU] | Freq: Once | INTRAVENOUS | Status: AC
  Administered 2016-12-27: 3000 [IU] via INTRAVENOUS
  Filled 2016-12-27: qty 3000

## 2016-12-27 MED ORDER — MORPHINE SULFATE (PF) 2 MG/ML IV SOLN
2.0000 mg | INTRAVENOUS | Status: DC | PRN
  Administered 2016-12-27 (×2): 2 mg via INTRAVENOUS
  Administered 2016-12-28 (×2): 4 mg via INTRAVENOUS
  Administered 2016-12-28: 2 mg via INTRAVENOUS
  Filled 2016-12-27: qty 2
  Filled 2016-12-27: qty 1
  Filled 2016-12-27: qty 2
  Filled 2016-12-27 (×2): qty 1

## 2016-12-27 MED ORDER — ACETAMINOPHEN 650 MG RE SUPP: 650.0000 mg | Freq: Four times a day (QID) | RECTAL | Status: DC | PRN

## 2016-12-27 MED ORDER — NITROGLYCERIN 0.4 MG SL SUBL
0.4000 mg | SUBLINGUAL_TABLET | SUBLINGUAL | Status: DC | PRN
  Administered 2016-12-27: 0.4 mg via SUBLINGUAL
  Filled 2016-12-27: qty 1

## 2016-12-27 MED ORDER — NITROGLYCERIN IN D5W 200-5 MCG/ML-% IV SOLN
0.0000 ug/min | Freq: Once | INTRAVENOUS | Status: AC
  Administered 2016-12-28: 60 ug/min via INTRAVENOUS
  Filled 2016-12-27: qty 250

## 2016-12-27 MED ORDER — FLUTICASONE PROPIONATE 50 MCG/ACT NA SUSP
2.0000 | Freq: Every day | NASAL | Status: DC
  Administered 2016-12-28 – 2016-12-29 (×2): 2 via NASAL
  Filled 2016-12-27: qty 16

## 2016-12-27 MED ORDER — MORPHINE SULFATE (PF) 4 MG/ML IV SOLN
4.0000 mg | Freq: Once | INTRAVENOUS | Status: AC
  Administered 2016-12-27: 4 mg via INTRAVENOUS
  Filled 2016-12-27: qty 1

## 2016-12-27 MED ORDER — ADULT MULTIVITAMIN W/MINERALS CH
1.0000 | ORAL_TABLET | Freq: Every day | ORAL | Status: DC
  Administered 2016-12-28 – 2016-12-29 (×2): 1 via ORAL
  Filled 2016-12-27 (×2): qty 1

## 2016-12-27 MED ORDER — HEPARIN BOLUS VIA INFUSION
4000.0000 [IU] | Freq: Once | INTRAVENOUS | Status: AC
  Administered 2016-12-27: 4000 [IU] via INTRAVENOUS
  Filled 2016-12-27: qty 4000

## 2016-12-27 MED ORDER — SODIUM CHLORIDE 0.9 % IJ SOLN
1.5000 mg | INTRAVENOUS | Status: AC
  Filled 2016-12-27: qty 0.3

## 2016-12-27 MED ORDER — INSULIN ASPART 100 UNIT/ML ~~LOC~~ SOLN: 0.0000 [IU] | Freq: Three times a day (TID) | SUBCUTANEOUS | Status: DC

## 2016-12-27 MED ORDER — LEVOTHYROXINE SODIUM 112 MCG PO TABS
112.0000 ug | ORAL_TABLET | Freq: Every day | ORAL | Status: DC
  Administered 2016-12-28 – 2016-12-29 (×2): 112 ug via ORAL
  Filled 2016-12-27 (×2): qty 1

## 2016-12-27 MED ORDER — IOPAMIDOL (ISOVUE-370) INJECTION 76%
100.0000 mL | Freq: Once | INTRAVENOUS | Status: AC | PRN
  Administered 2016-12-27: 100 mL via INTRAVENOUS

## 2016-12-27 MED ORDER — HEPARIN (PORCINE) IN NACL 100-0.45 UNIT/ML-% IJ SOLN
1800.0000 [IU]/h | INTRAMUSCULAR | Status: DC
  Administered 2016-12-27: 1300 [IU]/h via INTRAVENOUS
  Administered 2016-12-28: 1600 [IU]/h via INTRAVENOUS
  Filled 2016-12-27 (×3): qty 250

## 2016-12-27 MED ORDER — FUROSEMIDE 40 MG PO TABS
40.0000 mg | ORAL_TABLET | Freq: Every day | ORAL | Status: DC
  Administered 2016-12-29: 40 mg via ORAL
  Filled 2016-12-27: qty 1
  Filled 2016-12-27 (×2): qty 2

## 2016-12-27 MED ORDER — VITAMIN E 180 MG (400 UNIT) PO CAPS
400.0000 [IU] | ORAL_CAPSULE | Freq: Every day | ORAL | Status: DC
  Administered 2016-12-28 – 2016-12-29 (×2): 400 [IU] via ORAL
  Filled 2016-12-27 (×2): qty 1

## 2016-12-27 MED ORDER — INSULIN ASPART 100 UNIT/ML ~~LOC~~ SOLN
0.0000 [IU] | Freq: Three times a day (TID) | SUBCUTANEOUS | Status: DC
  Administered 2016-12-27: 1 [IU] via SUBCUTANEOUS
  Administered 2016-12-28: 5 [IU] via SUBCUTANEOUS
  Administered 2016-12-28: 7 [IU] via SUBCUTANEOUS
  Administered 2016-12-28: 3 [IU] via SUBCUTANEOUS
  Administered 2016-12-29: 2 [IU] via SUBCUTANEOUS
  Administered 2016-12-29: 5 [IU] via SUBCUTANEOUS
  Filled 2016-12-27 (×6): qty 1

## 2016-12-27 NOTE — Progress Notes (Addendum)
Admitted to ICU 4. Chest pain radiating to left arm with nausea. Treated with 4 mg.of Morphine and Zofran. Stat EKG and blood work done. No changes in EKG noted and Troponin was negative. Pain decreased significantly.1900 another episode of chest pain, not time for Morphine so Nitro drip increased to 60 mg.

## 2016-12-27 NOTE — ED Notes (Signed)
Pharmacy tech at bedside 
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
## 2016-12-27 NOTE — Consult Note (Signed)
ANTICOAGULATION CONSULT NOTE - Follow Up Consult  Pharmacy Consult for heparin Indication: chest pain/ACS  Allergies  Allergen Reactions  . Nsaids Palpitations and Other (See Comments)    ACID REFLUX   . Statins Other (See Comments)    MYALGIAS, LEG CRAMPS  . Librax [Chlordiazepoxide-Clidinium] Other (See Comments)    Urinary retention   . Tessalon [Benzonatate] Other (See Comments)    Unable to swallow due to GERD - caused throat numbness  . Codeine Nausea Only  . Sulfa Drugs Cross Reactors Nausea Only    Patient Measurements: Height: 5' 9"  (175.3 cm) Weight: (!) 325 lb (147.4 kg) IBW/kg (Calculated) : 70.7 Heparin Dosing Weight: 106.1kg  Vital Signs: Temp: 98.1 F (36.7 C) (11/06 1015) Temp Source: Oral (11/06 1015) BP: 120/73 (11/06 1400) Pulse Rate: 71 (11/06 1400)  Labs: Recent Labs    12/27/16 1013  HGB 14.3  HCT 43.3  PLT 119*  CREATININE 0.97  TROPONINI <0.03    Estimated Creatinine Clearance: 114.7 mL/min (by C-G formula based on SCr of 0.97 mg/dL).   Medications:  Scheduled:  . heparin  4,000 Units Intravenous Once    Assessment: Pt is a 61 year old male with chest pain and SOB x 1 month. Pt has a PMH of CHF, CAD, DM, HTN. No anticoagulants seen on med rec. Baseline CBC WNL. Add on APTT and INR ordered.  Goal of Therapy:  Heparin level 0.3-0.7 units/ml Monitor platelets by anticoagulation protocol: Yes   Plan:  Give 4000 units bolus x 1 Start heparin infusion at 1300 units/hr Check anti-Xa level in 6 hours and daily while on heparin Continue to monitor H&H and platelets  Melissa D Maccia, Pharm.D, BCPS Clinical Pharmacist  12/27/2016,2:24 PM

## 2016-12-27 NOTE — Progress Notes (Signed)
PHARMACIST - PHYSICIAN ORDER COMMUNICATION  CONCERNING: P&T Medication Policy on Herbal Medications  DESCRIPTION:  This patient's order for:  Red yeast rice caps  has been noted.  This product(s) is classified as an "herbal" or natural product. Due to a lack of definitive safety studies or FDA approval, nonstandard manufacturing practices, plus the potential risk of unknown drug-drug interactions while on inpatient medications, the Pharmacy and Therapeutics Committee does not permit the use of "herbal" or natural products of this type within [facility].   ACTION TAKEN: The pharmacy department is unable to verify this order at this time and your patient has been informed of this safety policy. Please reevaluate patient's clinical condition at discharge and address if the herbal or natural product(s) should be resumed at that time.

## 2016-12-27 NOTE — Progress Notes (Signed)
The patient has not been seen by me in more than a year and a half.  At that time I performed a preop pulmonary evaluation prior to knee surgery.  He was referred back to me after a recent hospitalization for dyspnea and CHF.  Upon arrival to our office, he was in extremis with severe dyspnea and chest pain (pressure) radiating to his shoulders and jaw.  An EKG revealed right bundle branch block (old) with PVCs.  His exam revealed no wheezes and no crackles.  My concern was that this represented unstable angina and we sent him to the emergency department for further evaluation and management.  He has subsequently been admitted for the same.  Merton Border, MD PCCM service Mobile [phone] Pager [phone] 12/27/2016 4:38 PM

## 2016-12-27 NOTE — Telephone Encounter (Signed)
Pt was on scheduled to be seen. He was brought back and seen by Dr.Simonds. Patient was then transferred to ED for further work-up.

## 2016-12-27 NOTE — Progress Notes (Signed)
ANTICOAGULATION CONSULT NOTE - Follow Up Consult  Pharmacy Consult for Heparin  Indication: chest pain/ACS  Allergies  Allergen Reactions  . Nsaids Palpitations and Other (See Comments)    ACID REFLUX   . Statins Other (See Comments)    MYALGIAS, LEG CRAMPS  . Librax [Chlordiazepoxide-Clidinium] Other (See Comments)    Urinary retention   . Tessalon [Benzonatate] Other (See Comments)    Unable to swallow due to GERD - caused throat numbness  . Codeine Nausea Only  . Sulfa Drugs Cross Reactors Nausea Only    Patient Measurements: Height: 5' 9"  (175.3 cm) Weight: (!) 325 lb (147.4 kg) IBW/kg (Calculated) : 70.7 Heparin Dosing Weight: 106.1 kg   Vital Signs: Temp: 98.1 F (36.7 C) (11/06 2015) Temp Source: Oral (11/06 2015) BP: 93/58 (11/06 2015) Pulse Rate: 96 (11/06 2015)  Labs: Recent Labs    12/27/16 1013 12/27/16 1414 12/27/16 1645 12/27/16 2055  HGB 14.3  --   --   --   HCT 43.3  --   --   --   PLT 119*  --   --   --   APTT  --  27  --   --   LABPROT  --  13.6  --   --   INR  --  1.05  --   --   HEPARINUNFRC  --   --   --  0.11*  CREATININE 0.97  --   --   --   TROPONINI <0.03  --  <0.03  --     Estimated Creatinine Clearance: 114.7 mL/min (by C-G formula based on SCr of 0.97 mg/dL).   Medications:  Medications Prior to Admission  Medication Sig Dispense Refill Last Dose  . aspirin 81 MG chewable tablet Chew 162 mg by mouth daily.   12/26/2016 at Unknown time  . furosemide (LASIX) 40 MG tablet Take 1 tablet (40 mg total) by mouth daily. (Patient taking differently: Take 40-80 mg daily by mouth. Takes 2 tablets if weight gain +3 lbs) 30 tablet 3 12/26/2016 at AM  . glimepiride (AMARYL) 2 MG tablet Take 2 tablets (4 mg total) by mouth daily with breakfast. 180 tablet 1 12/26/2016 at 0700  . Insulin Glargine (BASAGLAR KWIKPEN) 100 UNIT/ML SOPN Inject 0.25 mLs (25 Units total) into the skin at bedtime. 18 mL 3 12/26/2016 at Unknown time  . levothyroxine  (SYNTHROID, LEVOTHROID) 112 MCG tablet Take 1 tablet (112 mcg total) by mouth daily. 90 tablet 3 12/27/2016 at 0700  . lisinopril (PRINIVIL,ZESTRIL) 20 MG tablet Take 1 tablet (20 mg total) by mouth daily. 90 tablet 3 12/27/2016 at 0800  . metFORMIN (GLUCOPHAGE) 1000 MG tablet TAKE 1 TABLET BY MOUTH TWICE (2) DAILY WITH A MEAL 180 tablet 0 12/27/2016 at 0800  . metoCLOPramide (REGLAN) 10 MG tablet TAKE 1 TABLET BY MOUTH 3 TIMES DAILY BEFORE MEALS 90 tablet 1 12/26/2016 at 1800  . metoprolol tartrate (LOPRESSOR) 25 MG tablet TAKE 1 TABLET BY MOUTH TWICE A DAY 60 tablet 11 12/27/2016 at 0800  . Multiple Vitamin (MULITIVITAMIN WITH MINERALS) TABS Take 1 tablet by mouth daily.   12/27/2016 at 0800  . NEXIUM 40 MG capsule Take 1 capsule (40 mg total) by mouth 2 (two) times daily. 180 capsule 3 12/27/2016 at 0800  . nitroGLYCERIN (NITROSTAT) 0.4 MG SL tablet Place 1 tablet (0.4 mg total) under the tongue every 5 (five) minutes as needed for chest pain. 60 tablet 3 prn at prn  . polyethylene glycol (MIRALAX /  GLYCOLAX) packet 17grams in 16 oz of water twice a day until bowel movement.  LAXITIVE.  Restart if two days since last bowel movement 14 each 0 prn at prn  . potassium chloride (K-DUR,KLOR-CON) 10 MEQ tablet Take 10 mEq by mouth 2 (two) times daily.   12/27/2016 at 0800  . Red Yeast Rice 600 MG CAPS Take 1 capsule (600 mg total) by mouth 2 (two) times daily.   12/26/2016 at Unknown time  . VENTOLIN HFA 108 (90 Base) MCG/ACT inhaler INHALE 2 PUFFS INTO THE LUNGS EVERY 6 HOURS AS NEEDED FOR WHEEZING ORSHORTNESS OF BREATH 18 g 6 prn at prn  . vitamin E 400 UNIT capsule Take 400 Units by mouth daily.   12/27/2016 at 0800  . Docusate Sodium (DSS) 100 MG CAPS 1 tab 2 times a day while on narcotics.  STOOL SOFTENER (Patient taking differently: Take 100 mg by mouth 2 (two) times daily. ) 60 each 0 prn at prn  . fluticasone (FLONASE) 50 MCG/ACT nasal spray PLACE 2 SPRAYS INTO BOTH NOSTRILS DAILY 16 g 2 Taking  .  HYDROcodone-acetaminophen (NORCO/VICODIN) 5-325 MG tablet Take 1-2 tablets by mouth every 4 (four) hours as needed for moderate pain. (Patient not taking: Reported on 12/27/2016) 20 tablet 0 Not Taking at Unknown time  . ibuprofen (ADVIL,MOTRIN) 200 MG tablet Take 200 mg by mouth at bedtime.    prn at prn  . Insulin Pen Needle 31G X 6 MM MISC Use as directed for lantus solostar pen 100 each 3 as directed at as directed  . Ipratropium-Albuterol (COMBIVENT RESPIMAT) 20-100 MCG/ACT AERS respimat Inhale 1 puff into the lungs every 6 (six) hours. (Patient not taking: Reported on 12/27/2016) 4 g 3 Not Taking at Unknown time  . methocarbamol (ROBAXIN) 500 MG tablet 1-2 tablets every 6 hours prn muscle spasms (Patient taking differently: Take 500-1,000 mg by mouth every 6 (six) hours as needed for muscle spasms. ) 20 tablet 0 prn at prn    Assessment: CrC = 114.7 ml/min   Goal of Therapy:  Heparin level 0.3-0.7 units/ml Monitor platelets by anticoagulation protocol: Yes   Plan:  11/6:  HL @ 21:00 = 0.11  Will order Heparin 3000 units IV X 1 and increase drip rate to 1600 units/hr.  Will recheck HL 6 hrs after rate change.   Charlise Giovanetti D 12/27/2016,9:56 PM
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
## 2016-12-27 NOTE — Consult Note (Signed)
PULMONARY / CRITICAL CARE MEDICINE   Name: Chad [name] MRN: [id number] DOB: [birth year]    ADMISSION DATE:  12/27/2016   CONSULTATION DATE:  12/27/2016  REFERRING MD: Dr. Benjie Karvonen   REASON: UNSTABLE ANGINA  CHIEF COMPLAINT:  CHEST PAIN  HISTORY OF PRESENT ILLNESS:   This is a 61 year old male with a past medical history as indicated below who presented to the ED with complaints of chest pain.  Patient states that symptoms started about a month ago but got worse over the last 2 days.he describes the pain as midsternal, radiating to his  left and jaw.   associated symptoms include dyspnea.  At the ED, his cardiac enzymes were negative.  His baseline EKG did not show any acute ST changes.  he is being admitted to the stepdown unit for unstable angina. He is currently on a nitroglycerin drip and heparin drip.  He is still complaining of chest pain which he describes as sharp, midsternal, radiating to his left upper jaw, associated with dyspnea and nausea.  He denies palpitations, dizziness and headache.  PAST MEDICAL HISTORY :  He  has a past medical history of Abnormal drug screen, Acute diverticulitis (08/15/2014), Arthritis, Bone spur, Bulging lumbar disc, Diabetes mellitus without complication ([place]), Diastolic CHF, chronic ([place]) (04/02/2012), Diverticulosis, Gastric bypass status for obesity (1985), Gastritis (08/31/2015), GERD (gastroesophageal reflux disease), History of diabetes mellitus (1990s), HLD (hyperlipidemia), HTN (hypertension), Hyperglycemia glucose over 300 in last 24 hrs (07/12/2016), Hyperplastic colon polyp (2008), Hypothyroid, Internal hemorrhoids, Morbid obesity ([place]), Narrowing of lumbar spine, OSA (obstructive sleep apnea), Otomycosis of right ear (07/06/2011), Primary localized osteoarthritis of left knee (06/29/2016), PVC (premature ventricular contraction), Right ear pain, Seasonal allergies, Sensorineural hearing loss, bilateral, Splenomegaly, Thrombocytopenia ([facility]CC) (06/10/2015),  Thrombocytopenia ([place]) (06/10/2015), Tinnitus, and Trifascicular block  RBBB/LPFB/1AVB.  PAST SURGICAL HISTORY: He  has a past surgical history that includes Cholecystectomy (2005); Tonsillectomy (1980s); gastric stapling (1985); Abdominal surgery (1985); US ECHOCARDIOGRAPHY (12/2010); Colonoscopy (10/2006); ABIs (05/2011); Knee arthroscopy (Right, 06/2011); Cataract extraction w/ intraocular lens implant (Left, 2013); US ECHOCARDIOGRAPHY (09/2012); Tracheostomy (1980's); Tracheostomy closure (1990's); Cardiac catheterization (04/2010); Cardiac catheterization (01/2013); carotid US (10/2013); Colonoscopy (12/2014); Shoulder surgery (Left, 10/2014); Colonoscopy (08/2015); Esophagogastroduodenoscopy (08/2015); Eye surgery; TOTAL KNEE ARTHROPLASTY LEFT (Left, 07/11/2016); TOTAL KNEE ARTHROPLASTY (Right, 06/08/2015); Left Heart Cath and Coronary Angiography (N/A, 03/24/2015); ESOPHAGOGASTRODUODENOSCOPY (EGD) (N/A, 01/29/2013); LEFT HEART CATHETERIZATION WITH CORONARY ANGIOGRAM (N/A, 01/28/2013); and KNEE ARTHROSCOPY WITH MEDIAL MENISECTOMY (Right, 06/29/2011).  Allergies  Allergen Reactions  . Nsaids Palpitations and Other (See Comments)    ACID REFLUX   . Statins Other (See Comments)    MYALGIAS, LEG CRAMPS  . Librax [Chlordiazepoxide-Clidinium] Other (See Comments)    Urinary retention   . Tessalon [Benzonatate] Other (See Comments)    Unable to swallow due to GERD - caused throat numbness  . Codeine Nausea Only  . Sulfa Drugs Cross Reactors Nausea Only    No current facility-administered medications on file prior to encounter.    Current Outpatient Medications on File Prior to Encounter  Medication Sig  . aspirin 81 MG chewable tablet Chew 162 mg by mouth daily.  . furosemide (LASIX) 40 MG tablet Take 1 tablet (40 mg total) by mouth daily. (Patient taking differently: Take 40-80 mg daily by mouth. Takes 2 tablets if weight gain +3 lbs)  . glimepiride (AMARYL) 2 MG tablet Take 2 tablets (4 mg total) by mouth  daily with breakfast.  . Insulin Glargine (BASAGLAR KWIKPEN) 100 UNIT/ML SOPN Inject 0.25 mLs (25 Units  total) into the skin at bedtime.  Marland Kitchen levothyroxine (SYNTHROID, LEVOTHROID) 112 MCG tablet Take 1 tablet (112 mcg total) by mouth daily.  Marland Kitchen lisinopril (PRINIVIL,ZESTRIL) 20 MG tablet Take 1 tablet (20 mg total) by mouth daily.  . metFORMIN (GLUCOPHAGE) 1000 MG tablet TAKE 1 TABLET BY MOUTH TWICE (2) DAILY WITH A MEAL  . metoCLOPramide (REGLAN) 10 MG tablet TAKE 1 TABLET BY MOUTH 3 TIMES DAILY BEFORE MEALS  . metoprolol tartrate (LOPRESSOR) 25 MG tablet TAKE 1 TABLET BY MOUTH TWICE A DAY  . Multiple Vitamin (MULITIVITAMIN WITH MINERALS) TABS Take 1 tablet by mouth daily.  Marland Kitchen NEXIUM 40 MG capsule Take 1 capsule (40 mg total) by mouth 2 (two) times daily.  . nitroGLYCERIN (NITROSTAT) 0.4 MG SL tablet Place 1 tablet (0.4 mg total) under the tongue every 5 (five) minutes as needed for chest pain.  . polyethylene glycol (MIRALAX / GLYCOLAX) packet 17grams in 16 oz of water twice a day until bowel movement.  LAXITIVE.  Restart if two days since last bowel movement  . potassium chloride (K-DUR,KLOR-CON) 10 MEQ tablet Take 10 mEq by mouth 2 (two) times daily.  . Red Yeast Rice 600 MG CAPS Take 1 capsule (600 mg total) by mouth 2 (two) times daily.  . VENTOLIN HFA 108 (90 Base) MCG/ACT inhaler INHALE 2 PUFFS INTO THE LUNGS EVERY 6 HOURS AS NEEDED FOR WHEEZING ORSHORTNESS OF BREATH  . vitamin E 400 UNIT capsule Take 400 Units by mouth daily.  Mariane Baumgarten Sodium (DSS) 100 MG CAPS 1 tab 2 times a day while on narcotics.  STOOL SOFTENER (Patient taking differently: Take 100 mg by mouth 2 (two) times daily. )  . fluticasone (FLONASE) 50 MCG/ACT nasal spray PLACE 2 SPRAYS INTO BOTH NOSTRILS DAILY  . HYDROcodone-acetaminophen (NORCO/VICODIN) 5-325 MG tablet Take 1-2 tablets by mouth every 4 (four) hours as needed for moderate pain. (Patient not taking: Reported on 12/27/2016)  . ibuprofen (ADVIL,MOTRIN) 200 MG tablet  Take 200 mg by mouth at bedtime.   . Insulin Pen Needle 31G X 6 MM MISC Use as directed for lantus solostar pen  . Ipratropium-Albuterol (COMBIVENT RESPIMAT) 20-100 MCG/ACT AERS respimat Inhale 1 puff into the lungs every 6 (six) hours. (Patient not taking: Reported on 12/27/2016)  . methocarbamol (ROBAXIN) 500 MG tablet 1-2 tablets every 6 hours prn muscle spasms (Patient taking differently: Take 500-1,000 mg by mouth every 6 (six) hours as needed for muscle spasms. )    FAMILY HISTORY:  His indicated that his mother is alive. He indicated that his father is deceased. He indicated that both of his brothers are deceased. He indicated that the status of his maternal grandfather is unknown. He indicated that the status of his paternal aunt is unknown. He indicated that the status of his paternal uncle is unknown. He indicated that the status of his neg hx is unknown.   SOCIAL HISTORY: He  reports that  has never smoked. he has never used smokeless tobacco. He reports that he does not drink alcohol or use drugs.  REVIEW OF SYSTEMS:   All systems reviewed.  Pertinent positives include dyspnea, radicular chest pain, left arm pain and nausea.  All other systems are negative  OBJECTIVE  VITAL SIGNS: BP 119/63 (BP Location: Right Arm)   Pulse 82   Temp 97.9 F (36.6 C) (Oral)   Resp (!) 22   Ht 5' 9"  (1.753 m)   Wt (!) 325 lb (147.4 kg)   SpO2 98%   BMI  47.99 kg/m   HEMODYNAMICS:    VENTILATOR SETTINGS:    INTAKE / OUTPUT: No intake/output data recorded.  PHYSICAL EXAMINATION: General: Appears to be in moderate to severe distress Neuro: Alert and oriented x4, cranial nerves intact HEENT: PERRLA, trachea midline Cardiovascular:  Apical pulse regular, S1-S2, no murmur regurg or gallop, +2 pulses, no edema Lungs: Clear to auscultation bilaterally Abdomen:  Nontender nondistended, normal bowel sounds Musculoskeletal:  No deformities, no joint swelling Skin: Warm and  dry  LABS:  BMET Recent Labs  Lab 12/27/16 1013  NA 133*  K 4.0  CL 99*  CO2 21*  BUN 15  CREATININE 0.97  GLUCOSE 305*    Electrolytes Recent Labs  Lab 12/27/16 1013  CALCIUM 9.1    CBC Recent Labs  Lab 12/27/16 1013  WBC 4.9  HGB 14.3  HCT 43.3  PLT 119*    Coag's Recent Labs  Lab 12/27/16 1414  APTT 27  INR 1.05    Sepsis Markers No results for input(s): LATICACIDVEN, PROCALCITON, O2SATVEN in the last 168 hours.  ABG No results for input(s): PHART, PCO2ART, PO2ART in the last 168 hours.  Liver Enzymes No results for input(s): AST, ALT, ALKPHOS, BILITOT, ALBUMIN in the last 168 hours.  Cardiac Enzymes Recent Labs  Lab 12/27/16 1013  TROPONINI <0.03    Glucose Recent Labs  Lab 12/27/16 1404  GLUCAP 166*    Imaging Dg Chest 2 View  Result Date: 12/27/2016 CLINICAL DATA:  Chest pain and shortness of breath EXAM: CHEST  2 VIEW COMPARISON:  December 09, 2016 FINDINGS: There is no edema or consolidation. Heart size is upper normal with pulmonary vascularity within normal limits. No adenopathy. There is evidence of old rib trauma on the left laterally with remodeling. There is aortic atherosclerosis. There is degenerative change in the thoracic spine. IMPRESSION: No edema or consolidation. Heart upper normal in size. There is aortic atherosclerosis. Aortic Atherosclerosis (ICD10-I70.0). Electronically Signed   By: Lowella Grip III M.D.   On: 12/27/2016 11:26   Ct Angio Chest Pe W And/or Wo Contrast  Result Date: 12/27/2016 CLINICAL DATA:  Trouble breathing, chest pain, question pulmonary embolism, similar symptoms 2 weeks ago, history CHF, diabetes mellitus, hypertension EXAM: CT ANGIOGRAPHY CHEST WITH CONTRAST TECHNIQUE: Multidetector CT imaging of the chest was performed using the standard protocol during bolus administration of intravenous contrast. Multiplanar CT image reconstructions and MIPs were obtained to evaluate the vascular anatomy.  CONTRAST:  100 cc Isovue 370 IV COMPARISON:  12/08/2016 FINDINGS: Cardiovascular: Atherosclerotic calcifications aorta and coronary arteries. Aorta normal caliber without aneurysm or dissection. No pericardial effusion. Pulmonary arteries adequately opacified and patent. No evidence of pulmonary embolism. Mediastinum/Nodes: Esophagus unremarkable. Base of cervical region normal appearance. No thoracic adenopathy. Lungs/Pleura: Calcified granulomata in both lower lobes. Lungs otherwise clear. No pulmonary infiltrate, pleural effusion or pneumothorax. Upper Abdomen: Gallbladder surgically absent. Spleen incompletely visualized but question of splenomegaly is raised, greatest axial dimension 16.2 cm. Prior gastric stapling. Myelolipoma of the RIGHT adrenal gland 2.3 x 2.2 cm. Musculoskeletal: No acute osseous findings. Review of the MIP images confirms the above findings. IMPRESSION: No evidence of pulmonary embolism. Old granulomatous disease. Question splenomegaly. RIGHT adrenal myelolipoma. Coronary artery calcifications. Aortic Atherosclerosis (ICD10-I70.0). Electronically Signed   By: Lavonia Dana M.D.   On: 12/27/2016 12:44     DISCUSSION: 61 y/o male with a h/o CAD, T2DM, asthma, OSA and HTN presenting witH unstable angina   ASSESSMENT Unstable angina History of: Diastolic heart failure, type 2  diabetes, coronary artery disease, asthma, hypertension and obstructive sleep apnea  PLAN Hemodynamic monitoring per ICU protocol Cardiology following Continue with current treatment plan per cardiology Stat EKG and troponins-reviewed, unchanged from previous PCCM we will continue to monitor patient and intervene as needed    FAMILY  - Updates: Patient and wife updated at bedside  - Inter-disciplinary family meet or Palliative Care meeting due by:  day [place]. [facility] ANP-BC Pulmonary and [place] Pager [phone] or [phone]  12/27/2016, 4:45  PM

## 2016-12-27 NOTE — ED Triage Notes (Signed)
Pt was at a MD appointment and he began having trouble breathing and pain in his chest. MD sent pt to the ED. Pt has CHF and was recently admitted for the same 2 weeks ago. Pt reports feels like he may be holding fluid again. Pt wife reports he has been getting worse.

## 2016-12-27 NOTE — Telephone Encounter (Signed)
Pt c/o of Chest Pain: STAT if CP now or developed within 24 hours  1. Are you having CP right now? YES  2. Are you experiencing any other symptoms (ex. SOB, nausea, vomiting, sweating)?  Sob nausea   3. How long have you been experiencing CP?  A while   4. Is your CP continuous or coming and going? All the time   5. Have you taken Nitroglycerin?  Yes helps   None taken today  ?  Notified misty nurse

## 2016-12-28 ENCOUNTER — Inpatient Hospital Stay (HOSPITAL_BASED_OUTPATIENT_CLINIC_OR_DEPARTMENT_OTHER): Payer: Medicare Other

## 2016-12-28 DIAGNOSIS — I5032 Chronic diastolic (congestive) heart failure: Secondary | ICD-10-CM

## 2016-12-28 DIAGNOSIS — I25118 Atherosclerotic heart disease of native coronary artery with other forms of angina pectoris: Secondary | ICD-10-CM

## 2016-12-28 DIAGNOSIS — R0602 Shortness of breath: Secondary | ICD-10-CM

## 2016-12-28 DIAGNOSIS — R079 Chest pain, unspecified: Secondary | ICD-10-CM | POA: Diagnosis not present

## 2016-12-28 LAB — CBC
HEMATOCRIT: 37.4 % — AB (ref 40.0–52.0)
HEMOGLOBIN: 12.5 g/dL — AB (ref 13.0–18.0)
MCH: 27 pg (ref 26.0–34.0)
MCHC: 33.5 g/dL (ref 32.0–36.0)
MCV: 80.5 fL (ref 80.0–100.0)
Platelets: 108 10*3/uL — ABNORMAL LOW (ref 150–440)
RBC: 4.64 MIL/uL (ref 4.40–5.90)
RDW: 15 % — ABNORMAL HIGH (ref 11.5–14.5)
WBC: 6.9 10*3/uL (ref 3.8–10.6)

## 2016-12-28 LAB — BASIC METABOLIC PANEL
ANION GAP: 9 (ref 5–15)
BUN: 17 mg/dL (ref 6–20)
CHLORIDE: 100 mmol/L — AB (ref 101–111)
CO2: 25 mmol/L (ref 22–32)
Calcium: 8.4 mg/dL — ABNORMAL LOW (ref 8.9–10.3)
Creatinine, Ser: 1.1 mg/dL (ref 0.61–1.24)
GFR calc non Af Amer: >60 mL/min (ref >60)
GLUCOSE: 273 mg/dL — AB (ref 65–99)
Potassium: 3.8 mmol/L (ref 3.5–5.1)
Sodium: 134 mmol/L — ABNORMAL LOW (ref 135–145)

## 2016-12-28 LAB — GLUCOSE, CAPILLARY
Glucose-Capillary: 324 mg/dL — ABNORMAL HIGH (ref 65–99)
Glucose-Capillary: 247 mg/dL — ABNORMAL HIGH (ref 65–99)
Glucose-Capillary: 277 mg/dL — ABNORMAL HIGH (ref 65–99)
Glucose-Capillary: 278 mg/dL — ABNORMAL HIGH (ref 65–99)

## 2016-12-28 LAB — TROPONIN I: Troponin I: <0.03 ng/mL (ref <0.03)

## 2016-12-28 LAB — HEPARIN LEVEL (UNFRACTIONATED): Heparin Unfractionated: 0.27 IU/mL — ABNORMAL LOW (ref 0.30–0.70)

## 2016-12-28 MED ORDER — CYCLOBENZAPRINE HCL 10 MG PO TABS
5.0000 mg | ORAL_TABLET | Freq: Three times a day (TID) | ORAL | Status: DC
  Administered 2016-12-28 – 2016-12-29 (×3): 5 mg via ORAL
  Filled 2016-12-28 (×5): qty 1

## 2016-12-28 MED ORDER — ALUM & MAG HYDROXIDE-SIMETH 200-200-20 MG/5ML PO SUSP
30.0000 mL | Freq: Four times a day (QID) | ORAL | Status: DC | PRN
  Filled 2016-12-28: qty 30

## 2016-12-28 MED ORDER — PREMIER PROTEIN SHAKE
11.0000 [oz_av] | Freq: Two times a day (BID) | ORAL | Status: DC
  Administered 2016-12-29: 11 [oz_av] via ORAL

## 2016-12-28 MED ORDER — REGADENOSON 0.4 MG/5ML IV SOLN
0.4000 mg | Freq: Once | INTRAVENOUS | Status: AC
  Administered 2016-12-28: 0.4 mg via INTRAVENOUS

## 2016-12-28 MED ORDER — TECHNETIUM TC 99M TETROFOSMIN IV KIT
32.2200 | PACK | Freq: Once | INTRAVENOUS | Status: AC | PRN
  Administered 2016-12-28: 32.22 via INTRAVENOUS

## 2016-12-28 MED ORDER — PANTOPRAZOLE SODIUM 40 MG PO TBEC
40.0000 mg | DELAYED_RELEASE_TABLET | Freq: Two times a day (BID) | ORAL | Status: DC
  Administered 2016-12-28 – 2016-12-29 (×2): 40 mg via ORAL
  Filled 2016-12-28 (×2): qty 1

## 2016-12-28 MED ORDER — TRAMADOL HCL 50 MG PO TABS
50.0000 mg | ORAL_TABLET | Freq: Four times a day (QID) | ORAL | Status: DC | PRN
Start: 2016-12-28 — End: 2016-12-29
  Administered 2016-12-28 – 2016-12-29 (×3): 50 mg via ORAL
  Filled 2016-12-28 (×3): qty 1

## 2016-12-28 MED ORDER — KETOROLAC TROMETHAMINE 15 MG/ML IJ SOLN
15.0000 mg | Freq: Once | INTRAMUSCULAR | Status: AC
  Administered 2016-12-28: 15 mg via INTRAVENOUS
  Filled 2016-12-28: qty 1

## 2016-12-28 MED ORDER — HEPARIN BOLUS VIA INFUSION
2000.0000 [IU] | Freq: Once | INTRAVENOUS | Status: AC
  Administered 2016-12-28: 2000 [IU] via INTRAVENOUS
  Filled 2016-12-28: qty 2000

## 2016-12-28 MED ORDER — HEPARIN SODIUM (PORCINE) 5000 UNIT/ML IJ SOLN
5000.0000 [IU] | Freq: Three times a day (TID) | INTRAMUSCULAR | Status: DC
  Administered 2016-12-28 – 2016-12-29 (×4): 5000 [IU] via SUBCUTANEOUS
  Filled 2016-12-28 (×4): qty 1

## 2016-12-28 NOTE — Progress Notes (Signed)
This  Nurse called to patients room. Pt says he is having chest pain 9/10.  Pt describes as "pressure" in center of chest and radiating down left arm.  HR 92, RR 99% on RA. BP 128/59. Rechecked and came back the same. Pt asking for pain medication at this time. Dr.Gollan paged, awaiting call back. Will continue to assess.
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
## 2016-12-28 NOTE — Progress Notes (Addendum)
Inpatient Diabetes Program Recommendations  AACE/ADA: New Consensus Statement on Inpatient Glycemic Control (2015)  Target Ranges:  Prepandial:   less than 140 mg/dL      Peak postprandial:   less than 180 mg/dL (1-2 hours)      Critically ill patients:  140 - 180 mg/dL   Results for MARGUES, FILIPPINI (MRN [id number]) as of 12/28/2016 10:09  Ref. Range 12/27/2016 14:04 12/27/2016 16:24 12/27/2016 21:57 12/28/2016 07:18  Glucose-Capillary Latest Ref Range: 65 - 99 mg/dL 166 (H) 121 (H) 183 (H) 324 (H)   Review of Glycemic Control  Diabetes history: DM 2 Outpatient Diabetes medications: Basaglar 25 units, Amaryl 4 mg Daily, Metformin 1000 mg BID Current orders for Inpatient glycemic control: Lantus 25 units, Novolog Sensitive Correction 0-9 units tid  A1c 8.5%  Inpatient Diabetes Program Recommendations:    Glucose elevated this am, patient on multiple oral DM medications at home. Consider increasing Novolog Correction scale to Novolog Moderate 0-15 units tid + Novolog HS scale 0-5 units. Would like to watch on current dose of Lantus for now.  Thanks,  Tama Headings RN, MSN, [facility] Inpatient Diabetes Coordinator Team Pager [phone] (8a-5p)

## 2016-12-28 NOTE — Progress Notes (Signed)
Pt has removed oxygen stating he does not need it now. Pt asked if he could still get his morphine when he gets back from his stress test, stating "I think I'm going to need it then." Will continue to assess.

## 2016-12-28 NOTE — Progress Notes (Signed)
Report called to Richardson Landry, RN on 2A. Pt arrived back from stress test in stable condition. Will continue to assess.

## 2016-12-28 NOTE — Progress Notes (Signed)
Pt reports that 4 mg of Morphine given this am lowered chest pain to 4/10. Pt states chest pain increases with exertion and sometimes at random. Pt does experience nausea with the chest pain that is described as aching and radiating.Current pain level 4/10.Pt states this is much improved since earlier this am. Will continue to assess.

## 2016-12-28 NOTE — Progress Notes (Signed)
ANTICOAGULATION CONSULT NOTE - Follow Up Consult  Pharmacy Consult for Heparin  Indication: chest pain/ACS  Allergies  Allergen Reactions  . Nsaids Palpitations and Other (See Comments)    ACID REFLUX   . Statins Other (See Comments)    MYALGIAS, LEG CRAMPS  . Librax [Chlordiazepoxide-Clidinium] Other (See Comments)    Urinary retention   . Tessalon [Benzonatate] Other (See Comments)    Unable to swallow due to GERD - caused throat numbness  . Codeine Nausea Only  . Sulfa Drugs Cross Reactors Nausea Only    Patient Measurements: Height: 5' 9"  (175.3 cm) Weight: (!) 325 lb (147.4 kg) IBW/kg (Calculated) : 70.7 Heparin Dosing Weight: 106.1 kg   Vital Signs: Temp: 98.1 F (36.7 C) (11/07 0140) Temp Source: Oral (11/07 0140) BP: 102/61 (11/07 0500) Pulse Rate: 106 (11/07 0500)  Labs: Recent Labs    12/27/16 1013 12/27/16 1414 12/27/16 1645 12/27/16 2055 12/27/16 2220 12/28/16 0520  HGB 14.3  --   --   --   --  12.5*  HCT 43.3  --   --   --   --  37.4*  PLT 119*  --   --   --   --  108*  APTT  --  27  --   --   --   --   LABPROT  --  13.6  --   --   --   --   INR  --  1.05  --   --   --   --   HEPARINUNFRC  --   --   --  0.11*  --  0.27*  CREATININE 0.97  --   --   --   --  1.10  TROPONINI <0.03  --  <0.03  --  <0.03  --     Estimated Creatinine Clearance: 101.1 mL/min (by C-G formula based on SCr of 1.1 mg/dL).   Medications:  Medications Prior to Admission  Medication Sig Dispense Refill Last Dose  . aspirin 81 MG chewable tablet Chew 162 mg by mouth daily.   12/26/2016 at Unknown time  . furosemide (LASIX) 40 MG tablet Take 1 tablet (40 mg total) by mouth daily. (Patient taking differently: Take 40-80 mg daily by mouth. Takes 2 tablets if weight gain +3 lbs) 30 tablet 3 12/26/2016 at AM  . glimepiride (AMARYL) 2 MG tablet Take 2 tablets (4 mg total) by mouth daily with breakfast. 180 tablet 1 12/26/2016 at 0700  . Insulin Glargine (BASAGLAR KWIKPEN) 100  UNIT/ML SOPN Inject 0.25 mLs (25 Units total) into the skin at bedtime. 18 mL 3 12/26/2016 at Unknown time  . levothyroxine (SYNTHROID, LEVOTHROID) 112 MCG tablet Take 1 tablet (112 mcg total) by mouth daily. 90 tablet 3 12/27/2016 at 0700  . lisinopril (PRINIVIL,ZESTRIL) 20 MG tablet Take 1 tablet (20 mg total) by mouth daily. 90 tablet 3 12/27/2016 at 0800  . metFORMIN (GLUCOPHAGE) 1000 MG tablet TAKE 1 TABLET BY MOUTH TWICE (2) DAILY WITH A MEAL 180 tablet 0 12/27/2016 at 0800  . metoCLOPramide (REGLAN) 10 MG tablet TAKE 1 TABLET BY MOUTH 3 TIMES DAILY BEFORE MEALS 90 tablet 1 12/26/2016 at 1800  . metoprolol tartrate (LOPRESSOR) 25 MG tablet TAKE 1 TABLET BY MOUTH TWICE A DAY 60 tablet 11 12/27/2016 at 0800  . Multiple Vitamin (MULITIVITAMIN WITH MINERALS) TABS Take 1 tablet by mouth daily.   12/27/2016 at 0800  . NEXIUM 40 MG capsule Take 1 capsule (40 mg total) by mouth 2 (  two) times daily. 180 capsule 3 12/27/2016 at 0800  . nitroGLYCERIN (NITROSTAT) 0.4 MG SL tablet Place 1 tablet (0.4 mg total) under the tongue every 5 (five) minutes as needed for chest pain. 60 tablet 3 prn at prn  . polyethylene glycol (MIRALAX / GLYCOLAX) packet 17grams in 16 oz of water twice a day until bowel movement.  LAXITIVE.  Restart if two days since last bowel movement 14 each 0 prn at prn  . potassium chloride (K-DUR,KLOR-CON) 10 MEQ tablet Take 10 mEq by mouth 2 (two) times daily.   12/27/2016 at 0800  . Red Yeast Rice 600 MG CAPS Take 1 capsule (600 mg total) by mouth 2 (two) times daily.   12/26/2016 at Unknown time  . VENTOLIN HFA 108 (90 Base) MCG/ACT inhaler INHALE 2 PUFFS INTO THE LUNGS EVERY 6 HOURS AS NEEDED FOR WHEEZING ORSHORTNESS OF BREATH 18 g 6 prn at prn  . vitamin E 400 UNIT capsule Take 400 Units by mouth daily.   12/27/2016 at 0800  . Docusate Sodium (DSS) 100 MG CAPS 1 tab 2 times a day while on narcotics.  STOOL SOFTENER (Patient taking differently: Take 100 mg by mouth 2 (two) times daily. ) 60 each 0  prn at prn  . fluticasone (FLONASE) 50 MCG/ACT nasal spray PLACE 2 SPRAYS INTO BOTH NOSTRILS DAILY 16 g 2 Taking  . HYDROcodone-acetaminophen (NORCO/VICODIN) 5-325 MG tablet Take 1-2 tablets by mouth every 4 (four) hours as needed for moderate pain. (Patient not taking: Reported on 12/27/2016) 20 tablet 0 Not Taking at Unknown time  . ibuprofen (ADVIL,MOTRIN) 200 MG tablet Take 200 mg by mouth at bedtime.    prn at prn  . Insulin Pen Needle 31G X 6 MM MISC Use as directed for lantus solostar pen 100 each 3 as directed at as directed  . Ipratropium-Albuterol (COMBIVENT RESPIMAT) 20-100 MCG/ACT AERS respimat Inhale 1 puff into the lungs every 6 (six) hours. (Patient not taking: Reported on 12/27/2016) 4 g 3 Not Taking at Unknown time  . methocarbamol (ROBAXIN) 500 MG tablet 1-2 tablets every 6 hours prn muscle spasms (Patient taking differently: Take 500-1,000 mg by mouth every 6 (six) hours as needed for muscle spasms. ) 20 tablet 0 prn at prn    Assessment: CrC = 114.7 ml/min   Goal of Therapy:  Heparin level 0.3-0.7 units/ml Monitor platelets by anticoagulation protocol: Yes   Plan:  11/6:  HL @ 21:00 = 0.11  Will order Heparin 3000 units IV X 1 and increase drip rate to 1600 units/hr.  Will recheck HL 6 hrs after rate change.   11/7 @ 0520 HL 0.27 subtherapeutic. Will rebolus w/ heparin 2000 units IV x 1 and increase rate to 1800 units/hr and will recheck CBC/HL @ 1100. Hgb dropped by two units.  Tobie Lords, PharmD, BCPS Clinical Pharmacist 12/28/2016
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
## 2016-12-28 NOTE — Progress Notes (Signed)
[facility] [place] NAME: Chad [name]    MR#:  [id number]  DATE OF BIRTH:  Sep 03, 1955  SUBJECTIVE:  CHIEF COMPLAINT:   Chief Complaint  Patient presents with  . Shortness of Breath  . Chest Pain   - Patient complaining of significant chest pain, disproportionate and requesting for morphine - for stress test today  REVIEW OF SYSTEMS:  Review of Systems  Constitutional: Negative for chills, fever and malaise/fatigue.  HENT: Negative for congestion, ear discharge, hearing loss and nosebleeds.   Eyes: Negative for blurred vision and double vision.  Respiratory: Positive for shortness of breath. Negative for cough and wheezing.   Cardiovascular: Positive for chest pain. Negative for palpitations.  Gastrointestinal: Negative for abdominal pain, constipation, diarrhea, nausea and vomiting.  Genitourinary: Negative for dysuria.  Musculoskeletal: Negative for myalgias.  Neurological: Negative for dizziness, speech change, focal weakness, seizures and headaches.  Psychiatric/Behavioral: Negative for depression.    DRUG ALLERGIES:   Allergies  Allergen Reactions  . Nsaids Palpitations and Other (See Comments)    ACID REFLUX   . Statins Other (See Comments)    MYALGIAS, LEG CRAMPS  . Librax [Chlordiazepoxide-Clidinium] Other (See Comments)    Urinary retention   . Tessalon [Benzonatate] Other (See Comments)    Unable to swallow due to GERD - caused throat numbness  . Codeine Nausea Only  . Sulfa Drugs Cross Reactors Nausea Only    VITALS:  Blood pressure 125/67, pulse 88, temperature 97.9 F (36.6 C), temperature source Oral, resp. rate (!) 30, height 5' 9"  (1.753 m), weight (!) 147.4 kg (325 lb), SpO2 100 %.  PHYSICAL EXAMINATION:  Physical Exam  GENERAL:  61 y.o.-year-old obese patient lying in the bed, appears to be in acute distress.  EYES: Pupils equal, round, reactive to light and accommodation. No scleral icterus.  Extraocular muscles intact.  HEENT: Head atraumatic, normocephalic. Oropharynx and nasopharynx clear.  NECK:  Supple, no jugular venous distention. No thyroid enlargement, no tenderness.  LUNGS: Normal breath sounds bilaterally, no wheezing, rales,rhonchi or crepitation. No use of accessory muscles of respiration.  CARDIOVASCULAR: S1, S2 normal. No murmurs, rubs, or gallops.  ABDOMEN: Soft, nontender, nondistended. Bowel sounds present. No organomegaly or mass.  EXTREMITIES: No pedal edema, cyanosis, or clubbing.  NEUROLOGIC: Cranial nerves II through XII are intact. Muscle strength 5/5 in all extremities. Sensation intact. Gait not checked.  PSYCHIATRIC: The patient is alert and oriented x 3.  SKIN: No obvious rash, lesion, or ulcer.    LABORATORY PANEL:   CBC Recent Labs  Lab 12/28/16 0520  WBC 6.9  HGB 12.5*  HCT 37.4*  PLT 108*   ------------------------------------------------------------------------------------------------------------------  Chemistries  Recent Labs  Lab 12/28/16 0520  NA 134*  K 3.8  CL 100*  CO2 25  GLUCOSE 273*  BUN 17  CREATININE 1.10  CALCIUM 8.4*   ------------------------------------------------------------------------------------------------------------------  Cardiac Enzymes Recent Labs  Lab 12/28/16 0520  TROPONINI <0.03   ------------------------------------------------------------------------------------------------------------------  RADIOLOGY:  Dg Chest 2 View  Result Date: 12/27/2016 CLINICAL DATA:  Chest pain and shortness of breath EXAM: CHEST  2 VIEW COMPARISON:  December 09, 2016 FINDINGS: There is no edema or consolidation. Heart size is upper normal with pulmonary vascularity within normal limits. No adenopathy. There is evidence of old rib trauma on the left laterally with remodeling. There is aortic atherosclerosis. There is degenerative change in the thoracic spine. IMPRESSION: No edema or consolidation. Heart upper  normal in size. There is  aortic atherosclerosis. Aortic Atherosclerosis (ICD10-I70.0). Electronically Signed   By: Lowella Grip III M.D.   On: 12/27/2016 11:26   Ct Angio Chest Pe W And/or Wo Contrast  Result Date: 12/27/2016 CLINICAL DATA:  Trouble breathing, chest pain, question pulmonary embolism, similar symptoms 2 weeks ago, history CHF, diabetes mellitus, hypertension EXAM: CT ANGIOGRAPHY CHEST WITH CONTRAST TECHNIQUE: Multidetector CT imaging of the chest was performed using the standard protocol during bolus administration of intravenous contrast. Multiplanar CT image reconstructions and MIPs were obtained to evaluate the vascular anatomy. CONTRAST:  100 cc Isovue 370 IV COMPARISON:  12/08/2016 FINDINGS: Cardiovascular: Atherosclerotic calcifications aorta and coronary arteries. Aorta normal caliber without aneurysm or dissection. No pericardial effusion. Pulmonary arteries adequately opacified and patent. No evidence of pulmonary embolism. Mediastinum/Nodes: Esophagus unremarkable. Base of cervical region normal appearance. No thoracic adenopathy. Lungs/Pleura: Calcified granulomata in both lower lobes. Lungs otherwise clear. No pulmonary infiltrate, pleural effusion or pneumothorax. Upper Abdomen: Gallbladder surgically absent. Spleen incompletely visualized but question of splenomegaly is raised, greatest axial dimension 16.2 cm. Prior gastric stapling. Myelolipoma of the RIGHT adrenal gland 2.3 x 2.2 cm. Musculoskeletal: No acute osseous findings. Review of the MIP images confirms the above findings. IMPRESSION: No evidence of pulmonary embolism. Old granulomatous disease. Question splenomegaly. RIGHT adrenal myelolipoma. Coronary artery calcifications. Aortic Atherosclerosis (ICD10-I70.0). Electronically Signed   By: Lavonia Dana M.D.   On: 12/27/2016 12:44    EKG:   Orders placed or performed during the hospital encounter of 12/27/16  . ED EKG within 10 minutes  . ED EKG within 10  minutes  . EKG 12-Lead  . EKG 12-Lead  . EKG 12-Lead  . EKG 12-Lead    ASSESSMENT AND PLAN:   61 year old male with past medical history significant for nonobstructive coronary artery disease, nonalcoholic liver cirrhosis, diastolic CHF, history of gastric bypass surgery, GERD, type 2 diabetes mellitus and obesity presents to the hospital secondary to chest pain  #1 chest pain and dyspnea-very atypical and patient requesting for morphine constantly -CT angiogram showing clear lungs, no evidence of any pulmonary embolism. -Ruled out for MI, troponins are negative. -Agree with discontinuing nitro drip and heparin drip -For stress test today. Appreciate cardiology consult. -If stress test is negative, will treat for musculoskeletal pain. -We will avoid narcotics in such case. -Also being treated for GERD.  #2 GERD-already on Protonix, change to twice a day. Added Maalox as needed. -History of gastric bypass surgery. Denies any BS or pain or heartburn at this time.  #3 diabetes mellitus-continue Lantus and sliding scale insulin.  #4 obstructive sleep apnea-patient needs lifestyle modification changes and maybe sleep study evaluation as outpatient  #5 hypothyroidism-continue Synthroid  #6 hypertension-metoprolol and lisinopril  #7 DVT prophylaxis heparin subcutaneous heparin     All the records are reviewed and case discussed with Care Management/Social Workerr. Management plans discussed with the patient, family and they are in agreement.  CODE STATUS: Full Code  TOTAL TIME TAKING CARE OF THIS PATIENT: 38 minutes.   POSSIBLE D/C IN 1-2 DAYS, DEPENDING ON CLINICAL CONDITION.   Gladstone Lighter M.D on 12/28/2016 at 1:21 PM  Between 7am to 6pm - Pager - [phone]  After 6pm go to [email or web address] - password [email or web address] [facility]  Office  [phone]  CC: Primary care physician; [name], MD

## 2016-12-29 ENCOUNTER — Inpatient Hospital Stay: Payer: Medicare Other

## 2016-12-29 ENCOUNTER — Other Ambulatory Visit: Payer: Self-pay

## 2016-12-29 LAB — BASIC METABOLIC PANEL
ANION GAP: 10 (ref 5–15)
BUN: 18 mg/dL (ref 6–20)
CALCIUM: 8.8 mg/dL — AB (ref 8.9–10.3)
CO2: 28 mmol/L (ref 22–32)
Chloride: 97 mmol/L — ABNORMAL LOW (ref 101–111)
Creatinine, Ser: 1.08 mg/dL (ref 0.61–1.24)
GLUCOSE: 286 mg/dL — AB (ref 65–99)
POTASSIUM: 3.6 mmol/L (ref 3.5–5.1)
SODIUM: 135 mmol/L (ref 135–145)

## 2016-12-29 LAB — NM MYOCAR MULTI W/SPECT W/WALL MOTION / EF
CHL CUP MPHR: 159 {beats}/min
CHL CUP NUCLEAR SDS: 3
CHL CUP RESTING HR STRESS: 93 {beats}/min
CSEPED: 0 min
CSEPEDS: 0 s
CSEPHR: 66 %
Estimated workload: 1 METS
LVDIAVOL: 118 mL (ref 62–150)
LVSYSVOL: 40 mL
Peak HR: 105 {beats}/min
SRS: 2
SSS: 0
TID: 0.98

## 2016-12-29 LAB — GLUCOSE, CAPILLARY
GLUCOSE-CAPILLARY: 269 mg/dL — AB (ref 65–99)
GLUCOSE-CAPILLARY: 200 mg/dL — AB (ref 65–99)

## 2016-12-29 MED ORDER — TECHNETIUM TC 99M TETROFOSMIN IV KIT
33.6000 | PACK | Freq: Once | INTRAVENOUS | Status: AC | PRN
  Administered 2016-12-29: 33.6 via INTRAVENOUS

## 2016-12-29 MED ORDER — FUROSEMIDE 40 MG PO TABS: 40.0000 mg | ORAL_TABLET | Freq: Every day | ORAL | 3 refills | Status: DC

## 2016-12-29 MED ORDER — CYCLOBENZAPRINE HCL 5 MG PO TABS: 5.0000 mg | ORAL_TABLET | Freq: Three times a day (TID) | ORAL | 0 refills | Status: DC | PRN

## 2016-12-29 NOTE — Progress Notes (Signed)
Stress test result Stress images yesterday very good with no significant ischemia Resting images today to complement stress images from yesterday Again no ischemia noted, low normal ejection fraction likely secondary to GI uptake artifact No further cardiac workup needed Prior cardiac catheterization last year with nonobstructive disease Again this confirms chest pain likely noncardiac  Signed, Esmond Plants, MD, Ph.D [facility]

## 2016-12-29 NOTE — Progress Notes (Signed)
Pt discharged to home via wc.  Instructions  given to pt.  Questions answered.  No distress.

## 2016-12-29 NOTE — Progress Notes (Signed)
To nuclear medicine via bed

## 2016-12-29 NOTE — Care Management (Signed)
No discharge needs identified by members of the care team 

## 2016-12-29 NOTE — Progress Notes (Signed)
[facility] [place] NAME: Chad [name]    MR#:  [id number]  DATE OF BIRTH:  Apr 09, 1955  SUBJECTIVE:  CHIEF COMPLAINT:   Chief Complaint  Patient presents with  . Shortness of Breath  . Chest Pain   -Did not get any narcotics after his stress test yesterday. Going for the resting images this morning -Appears comfortable and denies any pain for now.  REVIEW OF SYSTEMS:  Review of Systems  Constitutional: Negative for chills, fever and malaise/fatigue.  HENT: Negative for congestion, ear discharge, hearing loss and nosebleeds.   Eyes: Negative for blurred vision and double vision.  Respiratory: Positive for shortness of breath. Negative for cough and wheezing.   Cardiovascular: Positive for chest pain. Negative for palpitations.  Gastrointestinal: Negative for abdominal pain, constipation, diarrhea, nausea and vomiting.  Genitourinary: Negative for dysuria.  Musculoskeletal: Negative for myalgias.  Neurological: Negative for dizziness, speech change, focal weakness, seizures and headaches.  Psychiatric/Behavioral: Negative for depression.    DRUG ALLERGIES:   Allergies  Allergen Reactions  . Nsaids Palpitations and Other (See Comments)    ACID REFLUX   . Statins Other (See Comments)    MYALGIAS, LEG CRAMPS  . Librax [Chlordiazepoxide-Clidinium] Other (See Comments)    Urinary retention   . Tessalon [Benzonatate] Other (See Comments)    Unable to swallow due to GERD - caused throat numbness  . Codeine Nausea Only  . Sulfa Drugs Cross Reactors Nausea Only    VITALS:  Blood pressure 128/63, pulse 75, temperature 98.9 F (37.2 C), temperature source Oral, resp. rate 18, height 5' 9"  (1.753 m), weight (!) 147.4 kg (325 lb), SpO2 95 %.  PHYSICAL EXAMINATION:  Physical Exam  GENERAL:  61 y.o.-year-old obese patient lying in the bed, appears to be in acute distress.  EYES: Pupils equal, round, reactive to light and  accommodation. No scleral icterus. Extraocular muscles intact.  HEENT: Head atraumatic, normocephalic. Oropharynx and nasopharynx clear.  NECK:  Supple, no jugular venous distention. No thyroid enlargement, no tenderness.  LUNGS: Normal breath sounds bilaterally, no wheezing, rales,rhonchi or crepitation. No use of accessory muscles of respiration.  CARDIOVASCULAR: S1, S2 normal. No murmurs, rubs, or gallops.  ABDOMEN: Soft, nontender, nondistended. Bowel sounds present. No organomegaly or mass.  EXTREMITIES: No pedal edema, cyanosis, or clubbing.  NEUROLOGIC: Cranial nerves II through XII are intact. Muscle strength 5/5 in all extremities. Sensation intact. Gait not checked.  PSYCHIATRIC: The patient is alert and oriented x 3.  SKIN: No obvious rash, lesion, or ulcer.      LABORATORY PANEL:   CBC Recent Labs  Lab 12/28/16 0520  WBC 6.9  HGB 12.5*  HCT 37.4*  PLT 108*   ------------------------------------------------------------------------------------------------------------------  Chemistries  Recent Labs  Lab 12/29/16 0552  NA 135  K 3.6  CL 97*  CO2 28  GLUCOSE 286*  BUN 18  CREATININE 1.08  CALCIUM 8.8*   ------------------------------------------------------------------------------------------------------------------  Cardiac Enzymes Recent Labs  Lab 12/28/16 0520  TROPONINI <0.03   ------------------------------------------------------------------------------------------------------------------  RADIOLOGY:  Dg Chest 2 View  Result Date: 12/27/2016 CLINICAL DATA:  Chest pain and shortness of breath EXAM: CHEST  2 VIEW COMPARISON:  December 09, 2016 FINDINGS: There is no edema or consolidation. Heart size is upper normal with pulmonary vascularity within normal limits. No adenopathy. There is evidence of old rib trauma on the left laterally with remodeling. There is aortic atherosclerosis. There is degenerative change in the thoracic spine. IMPRESSION: No edema  or consolidation. Heart upper normal in size. There is aortic atherosclerosis. Aortic Atherosclerosis (ICD10-I70.0). Electronically Signed   By: Lowella Grip III M.D.   On: 12/27/2016 11:26   Ct Angio Chest Pe W And/or Wo Contrast  Result Date: 12/27/2016 CLINICAL DATA:  Trouble breathing, chest pain, question pulmonary embolism, similar symptoms 2 weeks ago, history CHF, diabetes mellitus, hypertension EXAM: CT ANGIOGRAPHY CHEST WITH CONTRAST TECHNIQUE: Multidetector CT imaging of the chest was performed using the standard protocol during bolus administration of intravenous contrast. Multiplanar CT image reconstructions and MIPs were obtained to evaluate the vascular anatomy. CONTRAST:  100 cc Isovue 370 IV COMPARISON:  12/08/2016 FINDINGS: Cardiovascular: Atherosclerotic calcifications aorta and coronary arteries. Aorta normal caliber without aneurysm or dissection. No pericardial effusion. Pulmonary arteries adequately opacified and patent. No evidence of pulmonary embolism. Mediastinum/Nodes: Esophagus unremarkable. Base of cervical region normal appearance. No thoracic adenopathy. Lungs/Pleura: Calcified granulomata in both lower lobes. Lungs otherwise clear. No pulmonary infiltrate, pleural effusion or pneumothorax. Upper Abdomen: Gallbladder surgically absent. Spleen incompletely visualized but question of splenomegaly is raised, greatest axial dimension 16.2 cm. Prior gastric stapling. Myelolipoma of the RIGHT adrenal gland 2.3 x 2.2 cm. Musculoskeletal: No acute osseous findings. Review of the MIP images confirms the above findings. IMPRESSION: No evidence of pulmonary embolism. Old granulomatous disease. Question splenomegaly. RIGHT adrenal myelolipoma. Coronary artery calcifications. Aortic Atherosclerosis (ICD10-I70.0). Electronically Signed   By: Lavonia Dana M.D.   On: 12/27/2016 12:44    EKG:   Orders placed or performed during the hospital encounter of 12/27/16  . ED EKG within 10  minutes  . ED EKG within 10 minutes  . EKG 12-Lead  . EKG 12-Lead  . EKG 12-Lead  . EKG 12-Lead    ASSESSMENT AND PLAN:   61 year old male with past medical history significant for nonobstructive coronary artery disease, nonalcoholic liver cirrhosis, diastolic CHF, history of gastric bypass surgery, GERD, type 2 diabetes mellitus and obesity presents to the hospital secondary to chest pain  #1 chest pain and dyspnea- very atypical and patient requesting for morphine constantly - Discontinue all narcotics, receiving IV Toradol for pain. Denies any pain this morning -CT angiogram showing clear lungs, no evidence of any pulmonary embolism. -Ruled out for MI, troponins are negative. -off nitro drip and heparin drip -Appreciate cardiology consult. -treat for musculoskeletal pain. -We will avoid narcotics -Also being treated for GERD. - stress images- negative. For resting images this morning  #2 GERD-already on Protonix, change to twice a day.  -Added Maalox as needed. -History of gastric bypass surgery. Denies any heartburn at this time.  #3 diabetes mellitus-continue Lantus and sliding scale insulin.  #4 obstructive sleep apnea-patient needs lifestyle modification changes and maybe sleep study evaluation as outpatient  #5 hypothyroidism-continue Synthroid  #6 hypertension-metoprolol and lisinopril  #7 DVT prophylaxis : heparin subcutaneous heparin    discharge today if stress test is negative.    All the records are reviewed and case discussed with Care Management/Social Workerr. Management plans discussed with the patient, family and they are in agreement.  CODE STATUS: Full Code  TOTAL TIME TAKING CARE OF THIS PATIENT: 38 minutes.   POSSIBLE D/C TODAY, DEPENDING ON CLINICAL CONDITION.   Gladstone Lighter M.D on 12/29/2016 at 9:04 AM  Between 7am to 6pm - Pager - [phone]  After 6pm go to [email or web address] - password [email or web address] [facility]    Office  [phone]  CC: Primary care physician; [name], MD

## 2016-12-29 NOTE — Plan of Care (Signed)
Remains free from infection.  Scheduled for second part of stress test today, if negative will be discharged home.  Fall precautions in place, non skid socks when oob. Prn medications

## 2016-12-29 NOTE — Care Management Important Message (Signed)
Important Message  Patient Details  Name: Chad [name] MRN: [id number] Date of Birth: 1955-10-29   Medicare Important Message Given:  N/A - LOS <3 / Initial given by admissions    Katrina Stack, RN 12/29/2016, 2:23 PM

## 2016-12-29 NOTE — Progress Notes (Addendum)
Inpatient Diabetes Program Recommendations  AACE/ADA: New Consensus Statement on Inpatient Glycemic Control (2015)  Target Ranges:  Prepandial:   less than 140 mg/dL      Peak postprandial:   less than 180 mg/dL (1-2 hours)      Critically ill patients:  140 - 180 mg/dL   Lab Results  Component Value Date   GLUCAP 269 (H) 12/29/2016   HGBA1C 8.5 (H) 12/27/2016   Review of Glycemic Control  Results for RAIDYN, WASSINK (MRN [id number]) as of 12/29/2016 08:05  Ref. Range 12/28/2016 07:18 12/28/2016 11:12 12/28/2016 16:32 12/28/2016 20:42 12/29/2016 07:41  Glucose-Capillary Latest Ref Range: 65 - 99 mg/dL 324 (H) 247 (H) 277 (H) 278 (H) 269 (H)    Diabetes history: DM 2 Outpatient Diabetes medications: Basaglar 25 units, Amaryl 4 mg Daily, Metformin 1000 mg BID  Current orders for Inpatient glycemic control: Lantus 25 units, Novolog Sensitive Correction 0-9 units tid  A1c 8.5%  Inpatient Diabetes Program Recommendations:    Consider increasing Novolog Correction scale to Novolog Moderate 0-15 units tid + Novolog HS scale 0-5 units.  Consider increasing Lantus to 29 units qhs (0.2units/kg)  If he remains NPO, he should be on Novolog 0-15 units q4h.   Referral to outpatient diabetes/nutrition management requested- order placed.   Gentry Fitz, RN, BA, MHA, CDE Diabetes Coordinator Inpatient Diabetes Program  [phone] (Team Pager) [phone] ([place]) 12/29/2016 8:06 AM

## 2016-12-29 NOTE — Discharge Summary (Signed)
[place] [facility] [place] NAME: Cathy Ropp    MR#:  [id number]  DATE OF BIRTH:  [birth year]  DATE OF ADMISSION:  12/27/2016   ADMITTING PHYSICIAN: Bettey Costa, MD  DATE OF DISCHARGE: 12/29/2016  PRIMARY CARE PHYSICIAN: [name], MD   ADMISSION DIAGNOSIS:   Shortness of breath [R06.02] Unstable angina ([place]) [I20.0] Elevated troponin [R74.8] Chest pain, unspecified type [R07.9]  DISCHARGE DIAGNOSIS:   Active Problems:   Unstable angina ([place])   SECONDARY DIAGNOSIS:   Past Medical History:  Diagnosis Date  . Abnormal drug screen    innaprop negative for hydrocodone 09/2013, inapprop negative for hydrocodone and tramadol 02/2014; inappropr negative hydrocodone 03/2015  . Acute diverticulitis 08/15/2014  . Arthritis    "both hips and knees; got shots in each hip in August" (01/25/2013)  . Bone spur    L4 L5  . Bulging lumbar disc   . Coronary artery disease   . Diabetes mellitus without complication ([place])    no medicarions in over 2 years,wt. loss 100 lbs  . Diastolic CHF, chronic ([place]) 04/02/2012  . Diverticulosis   . Gastric bypass status for obesity 1985  . Gastritis 08/31/2015   with focal intestinal metaplasia  . GERD (gastroesophageal reflux disease)    severe, h/o gastritis and GI bleed, per pt normal EGD [facility] 2008  . History of diabetes mellitus 1990s   with mild background retinopathy, resolved with weight loss  . HLD (hyperlipidemia)    statin caused leg cramps  . HTN (hypertension)   . Hyperglycemia glucose over 300 in last 24 hrs 07/12/2016  . Hyperplastic colon polyp 2008  . Hypothyroid   . Internal hemorrhoids   . Morbid obesity ([place])   . Narrowing of lumbar spine   . OSA (obstructive sleep apnea)    unable to use CPAP as of last try 2/2 h/o tracheostomy?  . Otomycosis of right ear 07/06/2011  . Primary localized osteoarthritis of left knee 06/29/2016  . PVC (premature ventricular contraction)    RBBB  Infer axis  . Right ear pain    s/p eval by ENT - thought TMJ referred pain and sent to oral surg for dental splint  . Seasonal allergies   . Sensorineural hearing loss, bilateral    hearing aides  . Splenomegaly   . Thrombocytopenia ([place]) 06/10/2015  . Thrombocytopenia ([place]) 06/10/2015   Platelet count dropped to 73 post op day 2 after total knee   . Tinnitus    due to sensorineural hearing loss R>L with ETD  . Trifascicular block  RBBB/LPFB/1AVB     HOSPITAL COURSE:   62 year old male with past medical history significant for nonobstructive coronary artery disease, nonalcoholic liver cirrhosis, diastolic CHF, history of gastric bypass surgery, GERD, type 2 diabetes mellitus and obesity presents to the hospital secondary to chest pain  #1 chest pain and dyspnea- very atypical and patient requesting for morphine constantly - Discontinue all narcotics, now receiving IV Toradol for pain. Denies any pain this morning -CT angiogram showing clear lungs, no evidence of any pulmonary embolism. -Ruled out for MI, troponins are negative. -But says that he gets his pain on and off -Appreciate cardiology consult. -treat for musculoskeletal pain. Added muscle relaxants -We will avoid narcotics -Also being treated for GERD. - Cardiac catheterization in 2014 and also from last year showing nonobstructive coronary artery disease. Stress test was done this admission. -Negative stress test. - Following is the impression from stress test  by cardiology Stress test result Stress images yesterday very good with no significant ischemia Resting images today to complement stress images from yesterday Again no ischemia noted, low normal ejection fraction likely secondary to GI uptake artifact No further cardiac workup needed Prior cardiac catheterization last year with nonobstructive disease Again this confirms chest pain likely noncardiac  #2 GERD-already on Nexium twice a day,   -Added Maalox as  needed. -History of gastric bypass surgery. Denies any heartburn at this time.  #3 diabetes mellitus- patient on glimepiride, metformin and home doses of insulin  #4 obstructive sleep apnea-patient needs lifestyle modification changes and maybe sleep study evaluation as outpatient  #5 hypothyroidism-continue Synthroid  #6 hypertension-metoprolol and lisinopril   Patient will be discharged home today  DISCHARGE CONDITIONS:   Guarded CONSULTS OBTAINED:   Treatment Team:  Minna Merritts, MD  DRUG ALLERGIES:   Allergies  Allergen Reactions  . Nsaids Palpitations and Other (See Comments)    ACID REFLUX   . Statins Other (See Comments)    MYALGIAS, LEG CRAMPS  . Librax [Chlordiazepoxide-Clidinium] Other (See Comments)    Urinary retention   . Tessalon [Benzonatate] Other (See Comments)    Unable to swallow due to GERD - caused throat numbness  . Codeine Nausea Only  . Sulfa Drugs Cross Reactors Nausea Only   DISCHARGE MEDICATIONS:   Allergies as of 12/29/2016      Reactions   Nsaids Palpitations, Other (See Comments)   ACID REFLUX   Statins Other (See Comments)   MYALGIAS, LEG CRAMPS   Librax [chlordiazepoxide-clidinium] Other (See Comments)   Urinary retention    Tessalon [benzonatate] Other (See Comments)   Unable to swallow due to GERD - caused throat numbness   Codeine Nausea Only   Sulfa Drugs Cross Reactors Nausea Only      Medication List    STOP taking these medications   HYDROcodone-acetaminophen 5-325 MG tablet Commonly known as:  NORCO/VICODIN   ibuprofen 200 MG tablet Commonly known as:  ADVIL,MOTRIN   Insulin Pen Needle 31G X 6 MM Misc   Ipratropium-Albuterol 20-100 MCG/ACT Aers respimat Commonly known as:  COMBIVENT RESPIMAT     TAKE these medications   aspirin 81 MG chewable tablet Chew 162 mg by mouth daily.   BASAGLAR KWIKPEN 100 UNIT/ML Sopn Inject 0.25 mLs (25 Units total) into the skin at bedtime.   cyclobenzaprine 5 MG  tablet Commonly known as:  FLEXERIL Take 1 tablet (5 mg total) 3 (three) times daily as needed by mouth for muscle spasms.   DSS 100 MG Caps 1 tab 2 times a day while on narcotics.  STOOL SOFTENER What changed:    how much to take  how to take this  when to take this  additional instructions   fluticasone 50 MCG/ACT nasal spray Commonly known as:  FLONASE PLACE 2 SPRAYS INTO BOTH NOSTRILS DAILY   furosemide 40 MG tablet Commonly known as:  LASIX Take 1 tablet (40 mg total) daily by mouth. What changed:    how much to take  additional instructions   glimepiride 2 MG tablet Commonly known as:  AMARYL Take 2 tablets (4 mg total) by mouth daily with breakfast.   levothyroxine 112 MCG tablet Commonly known as:  SYNTHROID, LEVOTHROID Take 1 tablet (112 mcg total) by mouth daily.   lisinopril 20 MG tablet Commonly known as:  PRINIVIL,ZESTRIL Take 1 tablet (20 mg total) by mouth daily.   metFORMIN 1000 MG tablet Commonly known as:  GLUCOPHAGE  TAKE 1 TABLET BY MOUTH TWICE (2) DAILY WITH A MEAL   methocarbamol 500 MG tablet Commonly known as:  ROBAXIN 1-2 tablets every 6 hours prn muscle spasms What changed:    how much to take  how to take this  when to take this  reasons to take this  additional instructions   metoCLOPramide 10 MG tablet Commonly known as:  REGLAN TAKE 1 TABLET BY MOUTH 3 TIMES DAILY BEFORE MEALS   metoprolol tartrate 25 MG tablet Commonly known as:  LOPRESSOR TAKE 1 TABLET BY MOUTH TWICE A DAY   multivitamin with minerals Tabs tablet Take 1 tablet by mouth daily.   NEXIUM 40 MG capsule Generic drug:  esomeprazole Take 1 capsule (40 mg total) by mouth 2 (two) times daily.   nitroGLYCERIN 0.4 MG SL tablet Commonly known as:  NITROSTAT Place 1 tablet (0.4 mg total) under the tongue every 5 (five) minutes as needed for chest pain.   polyethylene glycol packet Commonly known as:  MIRALAX / GLYCOLAX 17grams in 16 oz of water twice a  day until bowel movement.  LAXITIVE.  Restart if two days since last bowel movement   potassium chloride 10 MEQ tablet Commonly known as:  K-DUR,KLOR-CON Take 10 mEq by mouth 2 (two) times daily.   Red Yeast Rice 600 MG Caps Take 1 capsule (600 mg total) by mouth 2 (two) times daily.   VENTOLIN HFA 108 (90 Base) MCG/ACT inhaler Generic drug:  albuterol INHALE 2 PUFFS INTO THE LUNGS EVERY 6 HOURS AS NEEDED FOR WHEEZING ORSHORTNESS OF BREATH   vitamin E 400 UNIT capsule Take 400 Units by mouth daily.        DISCHARGE INSTRUCTIONS:   1. PCP follow-up in 1-2 weeks  DIET:   Cardiac diet and Diabetic diet  ACTIVITY:   Activity as tolerated  OXYGEN:   Home Oxygen: No.  Oxygen Delivery: room air  DISCHARGE LOCATION:   home   If you experience worsening of your admission symptoms, develop shortness of breath, life threatening emergency, suicidal or homicidal thoughts you must seek medical attention immediately by calling 911 or calling your MD immediately  if symptoms less severe.  You Must read complete instructions/literature along with all the possible adverse reactions/side effects for all the Medicines you take and that have been prescribed to you. Take any new Medicines after you have completely understood and accpet all the possible adverse reactions/side effects.   Please note  You were cared for by a hospitalist during your hospital stay. If you have any questions about your discharge medications or the care you received while you were in the hospital after you are discharged, you can call the unit and asked to speak with the hospitalist on call if the hospitalist that took care of you is not available. Once you are discharged, your primary care physician will handle any further medical issues. Please note that NO REFILLS for any discharge medications will be authorized once you are discharged, as it is imperative that you return to your primary care physician (or  establish a relationship with a primary care physician if you do not have one) for your aftercare needs so that they can reassess your need for medications and monitor your lab values.    On the day of Discharge:  VITAL SIGNS:   Blood pressure 128/63, pulse 75, temperature 98.9 F (37.2 C), temperature source Oral, resp. rate 18, height 5' 9"  (1.753 m), weight (!) 147.4 kg (325 lb), SpO2 95 %.  PHYSICAL EXAMINATION:    GENERAL:  61 y.o.-year-old obese patient lying in the bed, does not appear to be in any acute distress at this time. EYES: Pupils equal, round, reactive to light and accommodation. No scleral icterus. Extraocular muscles intact.  HEENT: Head atraumatic, normocephalic. Oropharynx and nasopharynx clear.  NECK:  Supple, no jugular venous distention. No thyroid enlargement, no tenderness.  LUNGS: Normal breath sounds bilaterally, no wheezing, rales,rhonchi or crepitation. No use of accessory muscles of respiration.  CARDIOVASCULAR: S1, S2 normal. No murmurs, rubs, or gallops.  ABDOMEN: Soft, nontender, nondistended. Bowel sounds present. No organomegaly or mass.  EXTREMITIES: No pedal edema, cyanosis, or clubbing.  NEUROLOGIC: Cranial nerves II through XII are intact. Muscle strength 5/5 in all extremities. Sensation intact. Gait not checked.  PSYCHIATRIC: The patient is alert and oriented x 3.  SKIN: No obvious rash, lesion, or ulcer.      DATA REVIEW:   CBC Recent Labs  Lab 12/28/16 0520  WBC 6.9  HGB 12.5*  HCT 37.4*  PLT 108*    Chemistries  Recent Labs  Lab 12/29/16 0552  NA 135  K 3.6  CL 97*  CO2 28  GLUCOSE 286*  BUN 18  CREATININE 1.08  CALCIUM 8.8*     Microbiology Results  Results for orders placed or performed during the hospital encounter of 12/27/16  MRSA PCR Screening     Status: None   Collection Time: 12/27/16  4:23 PM  Result Value Ref Range Status   MRSA by PCR NEGATIVE NEGATIVE Final    Comment:        The GeneXpert MRSA  Assay (FDA approved for NASAL specimens only), is one component of a comprehensive MRSA colonization surveillance program. It is not intended to diagnose MRSA infection nor to guide or monitor treatment for MRSA infections.     RADIOLOGY:  Nm Myocar Multi W/spect W/wall Motion / Ef  Result Date: 12/29/2016 Pharmacological myocardial perfusion imaging study with no significant  ischemia Normal wall motion, EF estimated at 45% Depressed EF possibly secondary to GI uptake artifact Small region of fixed apical defect, worse at stress, likely secondary to attenuation artifact No EKG changes concerning for ischemia at peak stress or in recovery. Low risk scan Signed, Esmond Plants, MD, Ph.D [facility]     Management plans discussed with the patient, family and they are in agreement.  CODE STATUS:     Code Status Orders  (From admission, onward)        Start     Ordered   12/27/16 1627  Full code  Continuous     12/27/16 1626    Code Status History    Date Active Date Inactive Code Status Order ID Comments User Context   12/09/2016 22:59 12/11/2016 15:20 Full Code [id number]  [name], MD Inpatient   07/11/2016 13:22 07/13/2016 15:30 Full Code [id number]  Nilda Riggs Inpatient   03/24/2015 10:10 03/24/2015 21:49 Full Code [id number]  [place], Peter M, MD Inpatient   03/22/2015 13:36 03/24/2015 10:10 Full Code [id number]  Janece Canterbury, MD Inpatient   01/25/2013 19:22 01/29/2013 14:16 Full Code [id number]  Mendel Corning, MD Inpatient      TOTAL TIME TAKING CARE OF THIS PATIENT: 38 minutes.    Gladstone Lighter M.D on 12/29/2016 at 2:01 PM  Between 7am to 6pm - Pager - [phone]  After 6pm go to [email or web address] - [facility]  Office  [phone]  CC: Primary care physician; Danise Mina,  Garlon Hatchet, MD   Note: This dictation was prepared with Dragon dictation along with smaller phrase technology. Any transcriptional  errors that result from this process are unintentional.

## 2016-12-30 ENCOUNTER — Ambulatory Visit: Payer: Self-pay | Admitting: *Deleted

## 2016-12-30 ENCOUNTER — Telehealth: Payer: Self-pay | Admitting: *Deleted

## 2016-12-30 DIAGNOSIS — R918 Other nonspecific abnormal finding of lung field: Secondary | ICD-10-CM | POA: Diagnosis not present

## 2016-12-30 DIAGNOSIS — I493 Ventricular premature depolarization: Secondary | ICD-10-CM | POA: Diagnosis not present

## 2016-12-30 DIAGNOSIS — R0602 Shortness of breath: Secondary | ICD-10-CM | POA: Diagnosis not present

## 2016-12-30 DIAGNOSIS — J811 Chronic pulmonary edema: Secondary | ICD-10-CM | POA: Diagnosis not present

## 2016-12-30 DIAGNOSIS — I5032 Chronic diastolic (congestive) heart failure: Secondary | ICD-10-CM | POA: Diagnosis not present

## 2016-12-30 DIAGNOSIS — I110 Hypertensive heart disease with heart failure: Secondary | ICD-10-CM | POA: Diagnosis not present

## 2016-12-30 DIAGNOSIS — Z9884 Bariatric surgery status: Secondary | ICD-10-CM | POA: Diagnosis not present

## 2016-12-30 DIAGNOSIS — R072 Precordial pain: Secondary | ICD-10-CM | POA: Diagnosis not present

## 2016-12-30 DIAGNOSIS — R079 Chest pain, unspecified: Secondary | ICD-10-CM | POA: Diagnosis not present

## 2016-12-30 DIAGNOSIS — R61 Generalized hyperhidrosis: Secondary | ICD-10-CM | POA: Diagnosis not present

## 2016-12-30 DIAGNOSIS — I4510 Unspecified right bundle-branch block: Secondary | ICD-10-CM | POA: Diagnosis not present

## 2016-12-30 DIAGNOSIS — E11319 Type 2 diabetes mellitus with unspecified diabetic retinopathy without macular edema: Secondary | ICD-10-CM | POA: Diagnosis not present

## 2016-12-30 DIAGNOSIS — I440 Atrioventricular block, first degree: Secondary | ICD-10-CM | POA: Diagnosis not present

## 2016-12-30 DIAGNOSIS — I2510 Atherosclerotic heart disease of native coronary artery without angina pectoris: Secondary | ICD-10-CM | POA: Diagnosis not present

## 2016-12-30 DIAGNOSIS — R000 Tachycardia, unspecified: Secondary | ICD-10-CM | POA: Diagnosis not present

## 2016-12-30 NOTE — Telephone Encounter (Addendum)
I can see in chart review that pt did call as he reported he was going to do-- see [facility] nurse triage note.

## 2016-12-30 NOTE — Telephone Encounter (Addendum)
[place] at [place] NAME: Chad [name]    MR#:  [id number]  DATE OF BIRTH:  29-Jun-1955  DATE OF ADMISSION:  12/27/2016      ADMITTING PHYSICIAN: Bettey Costa, MD  DATE OF DISCHARGE: 12/29/2016  PRIMARY CARE PHYSICIAN: [name], MD   ADMISSION DIAGNOSIS:   Shortness of breath [R06.02] Unstable angina ([place]) [I20.0] Elevated troponin [R74.8] Chest pain, unspecified type [R07.9]  DISCHARGE DIAGNOSIS:   Active Problems:   Unstable angina [facility])   Transition Care Management Follow-up Telephone Call  How have you been since you were released from the hospital? Pt states his sugar is still running high and is in the process of calling Dr.Gutierrez's office now  to speak to the people at the [facility] office specifically. He states that the hospital doctor said his heart was fine, but that he does not understand everything that went on. Pt states he already has appt for f/u. Pt advised to call 911 for emergency.

## 2016-12-30 NOTE — Telephone Encounter (Signed)
Agreed.  Thanks.  

## 2016-12-30 NOTE — Telephone Encounter (Signed)
  Reason for Disposition . [1] Chest pain lasts > 5 minutes AND [2] occurred > 3 days ago (72 hours) AND [3] NO chest pain or cardiac symptoms now  Answer Assessment - Initial Assessment Questions 1. LOCATION: "Where does it hurt?"       Chest, both arms 2. RADIATION: "Does the pain go anywhere else?" (e.g., into neck, jaw, arms, back)     Jaw, back and neck 3. ONSET: "When did the chest pain begin?" (Minutes, hours or days)      2 weeks 4. PATTERN "Does the pain come and go, or has it been constant since it started?"  "Does it get worse with exertion?"      Comes and go, worse with exertion 5. DURATION: "How long does it last" (e.g., seconds, minutes, hours)     A  Long time, never goes away just eases off 6. SEVERITY: "How bad is the pain?"  (e.g., Scale 1-10; mild, moderate, or severe)    - MILD (1-3): doesn't interfere with normal activities     - MODERATE (4-7): interferes with normal activities or awakens from sleep    - SEVERE (8-10): excruciating pain, unable to do any normal activities       5 7. CARDIAC RISK FACTORS: "Do you have any history of heart problems or risk factors for heart disease?" (e.g., prior heart attack, angina; high blood pressure, diabetes, being overweight, high cholesterol, smoking, or strong family history of heart disease)     Diabetes, CHF, overweight, hx of heart disease, high blood pressure, angina 8. PULMONARY RISK FACTORS: "Do you have any history of lung disease?"  (e.g., blood clots in lung, asthma, emphysema, birth control pills)     asthma 9. CAUSE: "What do you think is causing the chest pain?"     no 10. OTHER SYMPTOMS: "Do you have any other symptoms?" (e.g., dizziness, nausea, vomiting, sweating, fever, difficulty breathing, cough)       Nausea, dizziness at time, cough, short of breath 11. PREGNANCY: "Is there any chance you are pregnant?" "When was your last menstrual period?"       n/a  Protocols used: CHEST PAIN-A-AH

## 2016-12-30 NOTE — Telephone Encounter (Signed)
Pt was discharged from hospital; pt is audibly SOB while talking on phone; CP pain level 8, pain going down lt arm and has pain in rt shoulder, sweating; FBS today was 344 and now BS 400.pt has taken med for acid reflux and muscle relaxer; pt will not go back to [facility] but pt will go to [facility] ED now.FYI to Dr Damita Dunnings.

## 2016-12-30 NOTE — Telephone Encounter (Signed)
Pt was discharged from hospital; pt is audibly SOB while talking on phone; CP pain level 8, pain going down lt arm and has pain in rt shoulder, sweating; FBS today was 344 and now BS 400.pt has taken med for acid reflux and muscle relaxer; pt will not go back to [facility] but pt will go to [facility] ED now.FYI to Dr Damita Dunnings.

## 2016-12-30 NOTE — Telephone Encounter (Signed)
Please get details about the current situation- how high is his sugar?  Any CP?  Let me know.  Thanks.

## 2017-01-04 ENCOUNTER — Telehealth: Payer: Self-pay | Admitting: Family Medicine

## 2017-01-04 ENCOUNTER — Ambulatory Visit: Payer: Medicare Other | Admitting: Nurse Practitioner

## 2017-01-04 NOTE — Telephone Encounter (Signed)
Noted  

## 2017-01-04 NOTE — Telephone Encounter (Signed)
Brazen Domangue Spouse [phone]  Belenda Cruise dropped off [place]'s Blood Sugar Log -- Placed on cart for Dr Darnell Level

## 2017-01-05 DIAGNOSIS — I5032 Chronic diastolic (congestive) heart failure: Secondary | ICD-10-CM | POA: Diagnosis not present

## 2017-01-05 DIAGNOSIS — I2510 Atherosclerotic heart disease of native coronary artery without angina pectoris: Secondary | ICD-10-CM | POA: Diagnosis not present

## 2017-01-05 DIAGNOSIS — I10 Essential (primary) hypertension: Secondary | ICD-10-CM | POA: Diagnosis not present

## 2017-01-05 NOTE — Telephone Encounter (Signed)
FYI. Fwd to Dr. Darnell Level.

## 2017-01-06 DIAGNOSIS — I2510 Atherosclerotic heart disease of native coronary artery without angina pectoris: Secondary | ICD-10-CM | POA: Diagnosis not present

## 2017-01-06 DIAGNOSIS — I5032 Chronic diastolic (congestive) heart failure: Secondary | ICD-10-CM | POA: Diagnosis not present

## 2017-01-08 NOTE — Telephone Encounter (Addendum)
I reviewed log - fasting sugars over the past few weeks trending up to 200-300s. Will discuss at [place] tomorrow and recommend increasing basaglar by 2 units every 3 days if average 3 day fasting sugar >150 to max 40 units daily.

## 2017-01-09 ENCOUNTER — Ambulatory Visit (INDEPENDENT_AMBULATORY_CARE_PROVIDER_SITE_OTHER): Payer: Medicare Other | Admitting: Family Medicine

## 2017-01-09 ENCOUNTER — Encounter: Payer: Self-pay | Admitting: Family Medicine

## 2017-01-09 VITALS — BP 120/70 | HR 74 | Temp 97.8°F | Wt 324.0 lb

## 2017-01-09 DIAGNOSIS — E11319 Type 2 diabetes mellitus with unspecified diabetic retinopathy without macular edema: Secondary | ICD-10-CM

## 2017-01-09 DIAGNOSIS — I10 Essential (primary) hypertension: Secondary | ICD-10-CM | POA: Diagnosis not present

## 2017-01-09 DIAGNOSIS — I5032 Chronic diastolic (congestive) heart failure: Secondary | ICD-10-CM

## 2017-01-09 DIAGNOSIS — R0602 Shortness of breath: Secondary | ICD-10-CM

## 2017-01-09 DIAGNOSIS — I200 Unstable angina: Secondary | ICD-10-CM

## 2017-01-09 DIAGNOSIS — I2510 Atherosclerotic heart disease of native coronary artery without angina pectoris: Secondary | ICD-10-CM | POA: Diagnosis not present

## 2017-01-09 DIAGNOSIS — E1165 Type 2 diabetes mellitus with hyperglycemia: Secondary | ICD-10-CM | POA: Diagnosis not present

## 2017-01-09 DIAGNOSIS — IMO0002 Reserved for concepts with insufficient information to code with codable children: Secondary | ICD-10-CM

## 2017-01-09 DIAGNOSIS — R079 Chest pain, unspecified: Secondary | ICD-10-CM

## 2017-01-09 NOTE — Progress Notes (Signed)
BP 120/70 (BP Location: Right Arm, Patient Position: Sitting, Cuff Size: Large)   Pulse 74   Temp 97.8 F (36.6 C) (Oral)   Wt (!) 324 lb (147 kg)   SpO2 96%   BMI 47.85 kg/m    CC: hosp f/u visit Subjective:    Patient ID: Chad [name], male    DOB: 12-Jan-1956, 61 y.o.   MRN: [id number]  HPI: Chad [name] is a 61 y.o. male presenting on 01/09/2017 for Hospitalization Follow-up (admitted to [facility] via ER visit on 12/27/16. Told had pulled muscle. Then seen at [facility] ER and told blood vessels in heart are hardening. Had echo and med changes. Has f/u at [facility] 01/20/18.)   Recent hospitalization at [facility] for dyspnea with chest pain - s/p reassuring stress test. Discharged home.  Recent ER visit at [place] reviewed. Ongoing dyspnea with chest pain despite unrevealing cardiac evaluation. CT did not show evidence of aortic dissection. Isosorbide 30m started - significant improvement noted since starting this. [facility] (Dr PTerance Hart. Metoprolol changed to toprol XL.   Echo 12/2016 - normal LV sys fxn with mild LVH, normal RV sys fxn, trivial regurg.   This morning fasting sugar 159. Prior to this sugars ranging 200-300s.   Relevant past medical, surgical, family and social history reviewed and updated as indicated. Interim medical history since our last visit reviewed. Allergies and medications reviewed and updated. Outpatient Medications Prior to Visit  Medication Sig Dispense Refill  . isosorbide mononitrate (IMDUR) 30 MG 24 hr tablet Take 1 tablet daily by mouth.    . metoprolol succinate (TOPROL-XL) 50 MG 24 hr tablet Take 50 mg daily by mouth. Take with or immediately following a meal.    . aspirin 81 MG chewable tablet Chew 162 mg by mouth daily.    . cyclobenzaprine (FLEXERIL) 5 MG tablet Take 1 tablet (5 mg total) 3 (three) times daily as needed by mouth for muscle spasms. 10 tablet 0  . Docusate Sodium (DSS) 100 MG CAPS 1 tab 2 times a day while on  narcotics.  STOOL SOFTENER (Patient taking differently: Take 100 mg by mouth 2 (two) times daily. ) 60 each 0  . fluticasone (FLONASE) 50 MCG/ACT nasal spray PLACE 2 SPRAYS INTO BOTH NOSTRILS DAILY 16 g 2  . furosemide (LASIX) 40 MG tablet Take 1 tablet (40 mg total) daily by mouth. 30 tablet 3  . glimepiride (AMARYL) 2 MG tablet Take 2 tablets (4 mg total) by mouth daily with breakfast. 180 tablet 1  . Insulin Glargine (BASAGLAR KWIKPEN) 100 UNIT/ML SOPN Inject 0.25 mLs (25 Units total) into the skin at bedtime. 18 mL 3  . levothyroxine (SYNTHROID, LEVOTHROID) 112 MCG tablet Take 1 tablet (112 mcg total) by mouth daily. 90 tablet 3  . lisinopril (PRINIVIL,ZESTRIL) 20 MG tablet Take 1 tablet (20 mg total) by mouth daily. 90 tablet 3  . metFORMIN (GLUCOPHAGE) 1000 MG tablet TAKE 1 TABLET BY MOUTH TWICE (2) DAILY WITH A MEAL 180 tablet 0  . metoCLOPramide (REGLAN) 10 MG tablet TAKE 1 TABLET BY MOUTH 3 TIMES DAILY BEFORE MEALS 90 tablet 1  . Multiple Vitamin (MULITIVITAMIN WITH MINERALS) TABS Take 1 tablet by mouth daily.    .Marland KitchenNEXIUM 40 MG capsule Take 1 capsule (40 mg total) by mouth 2 (two) times daily. 180 capsule 3  . nitroGLYCERIN (NITROSTAT) 0.4 MG SL tablet Place 1 tablet (0.4 mg total) under the tongue every 5 (five) minutes as needed  for chest pain. 60 tablet 3  . polyethylene glycol (MIRALAX / GLYCOLAX) packet 17grams in 16 oz of water twice a day until bowel movement.  LAXITIVE.  Restart if two days since last bowel movement 14 each 0  . potassium chloride (K-DUR,KLOR-CON) 10 MEQ tablet Take 10 mEq by mouth 2 (two) times daily.    . Red Yeast Rice 600 MG CAPS Take 1 capsule (600 mg total) by mouth 2 (two) times daily.    Marland Kitchen ULTICARE MINI PEN NEEDLES 31G X 6 MM MISC     . VENTOLIN HFA 108 (90 Base) MCG/ACT inhaler INHALE 2 PUFFS INTO THE LUNGS EVERY 6 HOURS AS NEEDED FOR WHEEZING ORSHORTNESS OF BREATH 18 g 6  . vitamin E 400 UNIT capsule Take 400 Units by mouth daily.    . methocarbamol  (ROBAXIN) 500 MG tablet 1-2 tablets every 6 hours prn muscle spasms (Patient taking differently: Take 500-1,000 mg by mouth every 6 (six) hours as needed for muscle spasms. ) 20 tablet 0  . metoprolol tartrate (LOPRESSOR) 25 MG tablet TAKE 1 TABLET BY MOUTH TWICE A DAY 60 tablet 11   No facility-administered medications prior to visit.      Per HPI unless specifically indicated in ROS section below Review of Systems     Objective:    BP 120/70 (BP Location: Right Arm, Patient Position: Sitting, Cuff Size: Large)   Pulse 74   Temp 97.8 F (36.6 C) (Oral)   Wt (!) 324 lb (147 kg)   SpO2 96%   BMI 47.85 kg/m   Wt Readings from Last 3 Encounters:  01/09/17 (!) 324 lb (147 kg)  12/27/16 (!) 325 lb (147.4 kg)  12/19/16 (!) 322 lb 2 oz (146.1 kg)    Physical Exam  Constitutional: He appears well-developed and well-nourished. No distress.  HENT:  Mouth/Throat: Oropharynx is clear and moist. No oropharyngeal exudate.  Cardiovascular: Normal rate, regular rhythm, normal heart sounds and intact distal pulses.  No murmur heard. Pulmonary/Chest: Effort normal and breath sounds normal. No respiratory distress. He has no wheezes. He has no rales.  Musculoskeletal: He exhibits no edema.  Skin: Skin is warm and dry. No rash noted.  Psychiatric: He has a normal mood and affect.  Nursing note and vitals reviewed.  Lab Results  Component Value Date   HGBA1C 8.5 (H) 12/27/2016       Assessment & Plan:   Problem List Items Addressed This Visit    Chest pain - Primary    Pt states he was explained that his hardened coronary arteries are not functioning adequately and make him feel like he's having a heart attack when he truly isn't. Regardless, imdur has significantly helped at this time. Plans to f/u with [place] cardiology.       Coronary artery disease, non-occlusive (Chronic)    H/o nonobstructive CAD by latest cath 02/2015      Relevant Medications   metoprolol succinate (TOPROL-XL)  50 MG 24 hr tablet   isosorbide mononitrate (IMDUR) 30 MG 24 hr tablet   Diastolic CHF, chronic (HCC) (Chronic)    Chronic, reviewed latest echo at [facility] which was stable.       Relevant Medications   metoprolol succinate (TOPROL-XL) 50 MG 24 hr tablet   isosorbide mononitrate (IMDUR) 30 MG 24 hr tablet   Dyspnea    See above.       HTN (hypertension) (Chronic)    Chronic. Improving with addition of imdur.  Relevant Medications   metoprolol succinate (TOPROL-XL) 50 MG 24 hr tablet   isosorbide mononitrate (IMDUR) 30 MG 24 hr tablet   Obesity, Class III, BMI 40-49.9 (morbid obesity) (HCC) (Chronic)    Now that chest pain is improving, pt motivated to restart exercise regimen. Reviewed slow titration of exercise.       Type 2 diabetes mellitus, uncontrolled, with retinopathy (HCC)    Chronic. Uncontrolled however he endorses today has had best sugar reading to date (159 fasting) may be improving with improving stress levels/illness. I did recommend slow titration of basaglar as per instructions. Continue metformin and glimepiride.           Follow up plan: Return in about 4 weeks (around 02/06/2017) for follow up visit.  [name], MD

## 2017-01-09 NOTE — Assessment & Plan Note (Signed)
Chronic. Improving with addition of imdur.

## 2017-01-09 NOTE — Assessment & Plan Note (Signed)
Now that chest pain is improving, pt motivated to restart exercise regimen. Reviewed slow titration of exercise.

## 2017-01-09 NOTE — Patient Instructions (Addendum)
Sugar staying too high - increase basaglar by 2 units every 3 days if average 3 day fasting sugar >150 to max 40 units daily. I'm glad you're doing better today - continue new medicines.  As you start feeling better, slowly increase activity as tolerated.  Return in 1 month for follow up visit. Bring log of sugars at that time.

## 2017-01-09 NOTE — Assessment & Plan Note (Addendum)
See above

## 2017-01-09 NOTE — Assessment & Plan Note (Signed)
Chronic. Uncontrolled however he endorses today has had best sugar reading to date (159 fasting) may be improving with improving stress levels/illness. I did recommend slow titration of basaglar as per instructions. Continue metformin and glimepiride.

## 2017-01-09 NOTE — Assessment & Plan Note (Signed)
H/o nonobstructive CAD by latest cath 02/2015

## 2017-01-09 NOTE — Assessment & Plan Note (Addendum)
Chronic, reviewed latest echo at [facility] which was stable.

## 2017-01-09 NOTE — Assessment & Plan Note (Signed)
Pt states he was explained that his hardened coronary arteries are not functioning adequately and make him feel like he's having a heart attack when he truly isn't. Regardless, imdur has significantly helped at this time. Plans to f/u with [place] cardiology.

## 2017-01-17 DIAGNOSIS — I5030 Unspecified diastolic (congestive) heart failure: Secondary | ICD-10-CM | POA: Diagnosis not present

## 2017-01-17 DIAGNOSIS — K219 Gastro-esophageal reflux disease without esophagitis: Secondary | ICD-10-CM | POA: Diagnosis not present

## 2017-01-17 DIAGNOSIS — R110 Nausea: Secondary | ICD-10-CM | POA: Diagnosis not present

## 2017-01-17 DIAGNOSIS — I499 Cardiac arrhythmia, unspecified: Secondary | ICD-10-CM | POA: Diagnosis not present

## 2017-01-17 DIAGNOSIS — Z7982 Long term (current) use of aspirin: Secondary | ICD-10-CM | POA: Diagnosis not present

## 2017-01-17 DIAGNOSIS — I10 Essential (primary) hypertension: Secondary | ICD-10-CM | POA: Diagnosis not present

## 2017-01-17 DIAGNOSIS — I4510 Unspecified right bundle-branch block: Secondary | ICD-10-CM | POA: Diagnosis not present

## 2017-01-17 DIAGNOSIS — E669 Obesity, unspecified: Secondary | ICD-10-CM | POA: Diagnosis not present

## 2017-01-17 DIAGNOSIS — E039 Hypothyroidism, unspecified: Secondary | ICD-10-CM | POA: Diagnosis not present

## 2017-01-17 DIAGNOSIS — Z7984 Long term (current) use of oral hypoglycemic drugs: Secondary | ICD-10-CM | POA: Diagnosis not present

## 2017-01-17 DIAGNOSIS — Z885 Allergy status to narcotic agent status: Secondary | ICD-10-CM | POA: Diagnosis not present

## 2017-01-17 DIAGNOSIS — I25110 Atherosclerotic heart disease of native coronary artery with unstable angina pectoris: Secondary | ICD-10-CM | POA: Diagnosis not present

## 2017-01-17 DIAGNOSIS — E785 Hyperlipidemia, unspecified: Secondary | ICD-10-CM | POA: Diagnosis not present

## 2017-01-17 DIAGNOSIS — I5032 Chronic diastolic (congestive) heart failure: Secondary | ICD-10-CM | POA: Diagnosis not present

## 2017-01-17 DIAGNOSIS — G4733 Obstructive sleep apnea (adult) (pediatric): Secondary | ICD-10-CM | POA: Diagnosis not present

## 2017-01-17 DIAGNOSIS — E119 Type 2 diabetes mellitus without complications: Secondary | ICD-10-CM | POA: Diagnosis not present

## 2017-01-17 DIAGNOSIS — Z6841 Body Mass Index (BMI) 40.0 and over, adult: Secondary | ICD-10-CM | POA: Diagnosis not present

## 2017-01-17 DIAGNOSIS — R0789 Other chest pain: Secondary | ICD-10-CM | POA: Diagnosis not present

## 2017-01-17 DIAGNOSIS — R079 Chest pain, unspecified: Secondary | ICD-10-CM | POA: Diagnosis not present

## 2017-01-17 DIAGNOSIS — G8929 Other chronic pain: Secondary | ICD-10-CM | POA: Diagnosis not present

## 2017-01-17 DIAGNOSIS — Z79899 Other long term (current) drug therapy: Secondary | ICD-10-CM | POA: Diagnosis not present

## 2017-01-17 DIAGNOSIS — Z882 Allergy status to sulfonamides status: Secondary | ICD-10-CM | POA: Diagnosis not present

## 2017-01-17 DIAGNOSIS — Z9884 Bariatric surgery status: Secondary | ICD-10-CM | POA: Diagnosis not present

## 2017-01-17 DIAGNOSIS — I110 Hypertensive heart disease with heart failure: Secondary | ICD-10-CM | POA: Diagnosis not present

## 2017-01-17 DIAGNOSIS — Z9049 Acquired absence of other specified parts of digestive tract: Secondary | ICD-10-CM | POA: Diagnosis not present

## 2017-01-18 DIAGNOSIS — R0600 Dyspnea, unspecified: Secondary | ICD-10-CM | POA: Diagnosis not present

## 2017-01-18 DIAGNOSIS — G894 Chronic pain syndrome: Secondary | ICD-10-CM | POA: Diagnosis not present

## 2017-01-18 DIAGNOSIS — I5030 Unspecified diastolic (congestive) heart failure: Secondary | ICD-10-CM | POA: Diagnosis not present

## 2017-01-18 DIAGNOSIS — E119 Type 2 diabetes mellitus without complications: Secondary | ICD-10-CM | POA: Diagnosis not present

## 2017-01-18 DIAGNOSIS — G4733 Obstructive sleep apnea (adult) (pediatric): Secondary | ICD-10-CM | POA: Diagnosis not present

## 2017-01-18 DIAGNOSIS — R9431 Abnormal electrocardiogram [ECG] [EKG]: Secondary | ICD-10-CM | POA: Diagnosis not present

## 2017-01-18 DIAGNOSIS — I2510 Atherosclerotic heart disease of native coronary artery without angina pectoris: Secondary | ICD-10-CM | POA: Diagnosis not present

## 2017-01-18 DIAGNOSIS — I200 Unstable angina: Secondary | ICD-10-CM | POA: Diagnosis not present

## 2017-01-18 DIAGNOSIS — I25110 Atherosclerotic heart disease of native coronary artery with unstable angina pectoris: Secondary | ICD-10-CM | POA: Diagnosis not present

## 2017-01-18 DIAGNOSIS — R0602 Shortness of breath: Secondary | ICD-10-CM | POA: Diagnosis not present

## 2017-01-18 DIAGNOSIS — E785 Hyperlipidemia, unspecified: Secondary | ICD-10-CM | POA: Diagnosis not present

## 2017-01-18 DIAGNOSIS — K219 Gastro-esophageal reflux disease without esophagitis: Secondary | ICD-10-CM | POA: Diagnosis not present

## 2017-01-18 DIAGNOSIS — E039 Hypothyroidism, unspecified: Secondary | ICD-10-CM | POA: Diagnosis not present

## 2017-01-18 DIAGNOSIS — I10 Essential (primary) hypertension: Secondary | ICD-10-CM | POA: Diagnosis not present

## 2017-01-18 DIAGNOSIS — E1165 Type 2 diabetes mellitus with hyperglycemia: Secondary | ICD-10-CM | POA: Diagnosis not present

## 2017-01-18 DIAGNOSIS — I452 Bifascicular block: Secondary | ICD-10-CM | POA: Diagnosis not present

## 2017-01-19 ENCOUNTER — Ambulatory Visit: Payer: Medicare Other | Admitting: Family

## 2017-01-22 ENCOUNTER — Encounter: Payer: Self-pay | Admitting: Family Medicine

## 2017-01-25 ENCOUNTER — Other Ambulatory Visit: Payer: Self-pay | Admitting: Family Medicine

## 2017-01-28 IMAGING — CT CT ABD-PELV W/ CM
1 of 3 series · 14 of 32 positions shown, 19 images · IV contrast (omnipaque)
Comparison: 08/10/2014

CLINICAL DATA: Lower abdominal and pelvic pain, 2 weeks duration.
Being treated for diverticulosis diagnosed 08/10/2014

EXAM:
CT ABDOMEN AND PELVIS WITH CONTRAST
TECHNIQUE: Multidetector CT imaging of the abdomen and pelvis was performed
using the standard protocol following bolus administration of
intravenous contrast.
CONTRAST:  1 OMNIPAQUE IOHEXOL 240 MG/ML SOLN, 125mL OMNIPAQUE
IOHEXOL 300 MG/ML SOLN

[Series 2: routine abd pel with · axial · 0.88mm/px · z∈[-564,-120]mm · 14 of 101 slices shown, 19 images]
[im 6/101  soft-tissue]
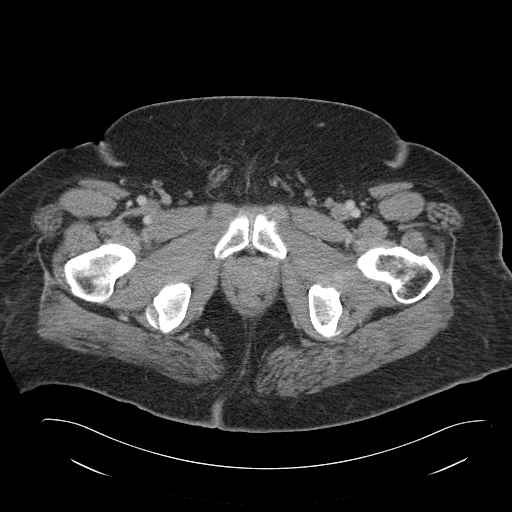
[im 6/101  bone]
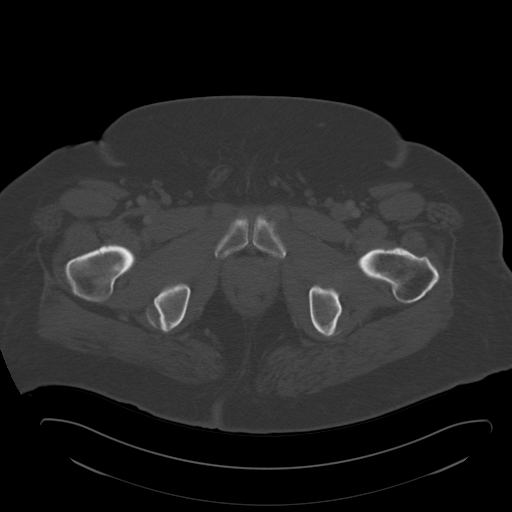
[im 12/101  soft-tissue]
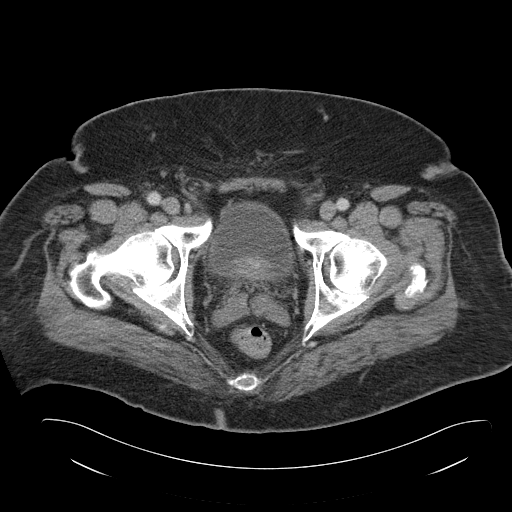
[im 23/101  soft-tissue]
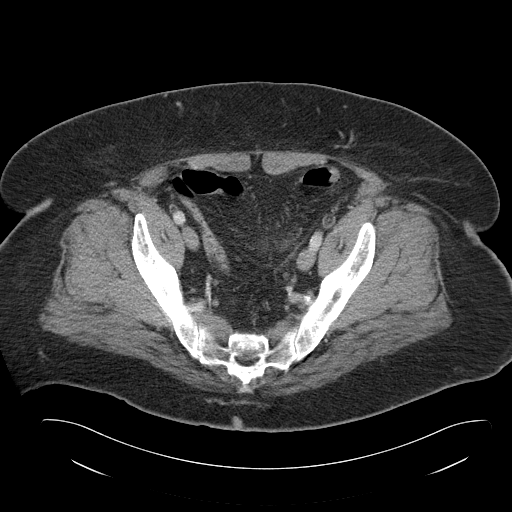
[im 28/101  soft-tissue]
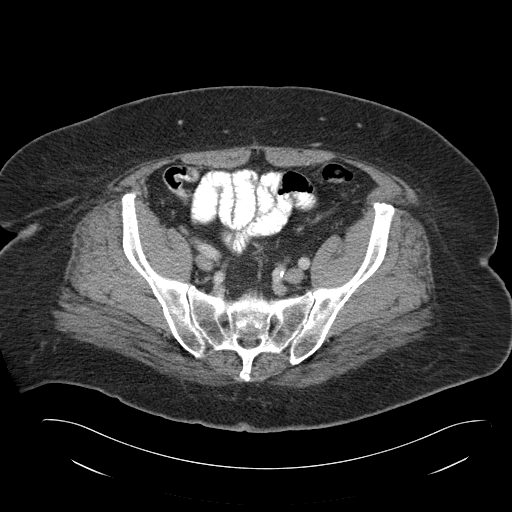
[im 34/101  soft-tissue]
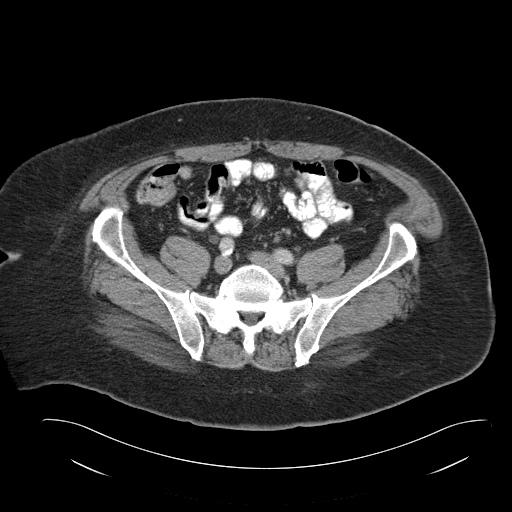
[im 45/101  soft-tissue]
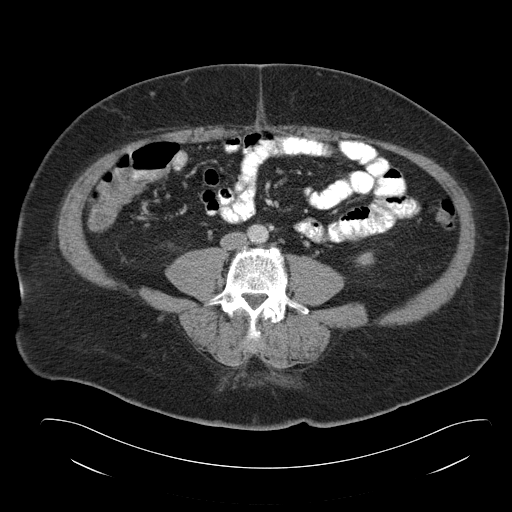
[im 51/101  soft-tissue]
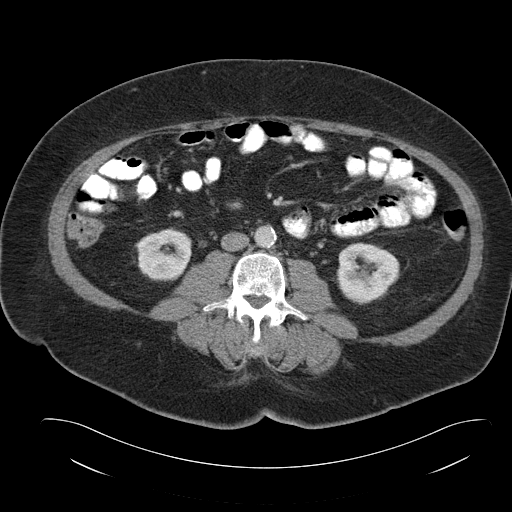
[im 56/101  soft-tissue]
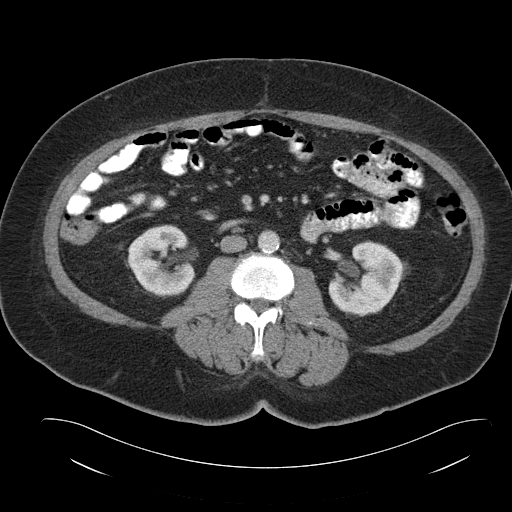
[im 67/101  soft-tissue]
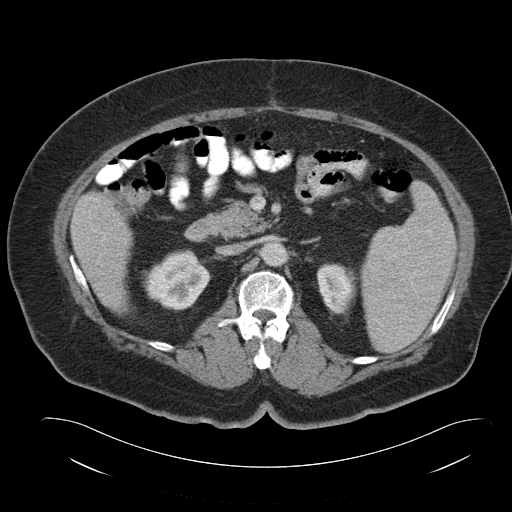
[im 67/101  bone]
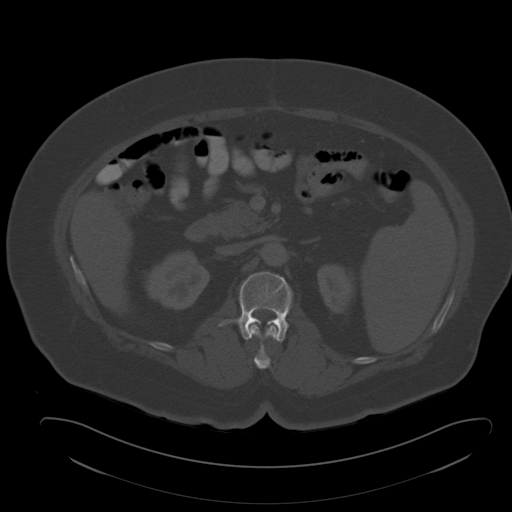
[im 73/101  soft-tissue]
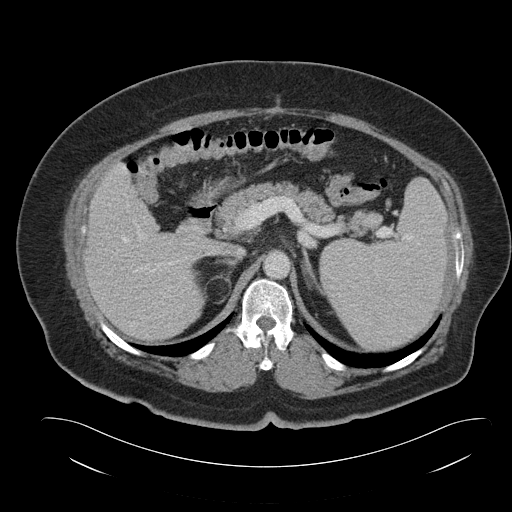
[im 78/101  soft-tissue]
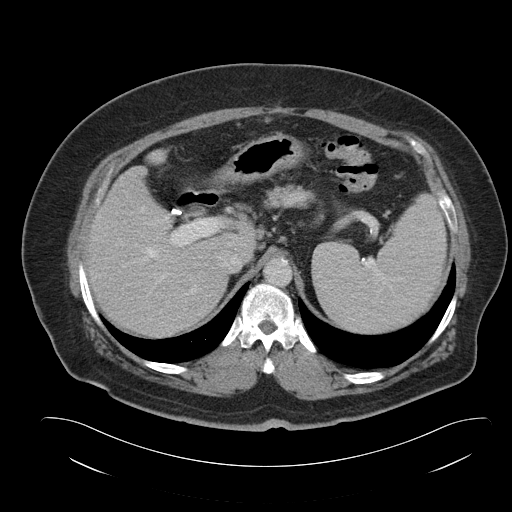
[im 78/101  lung]
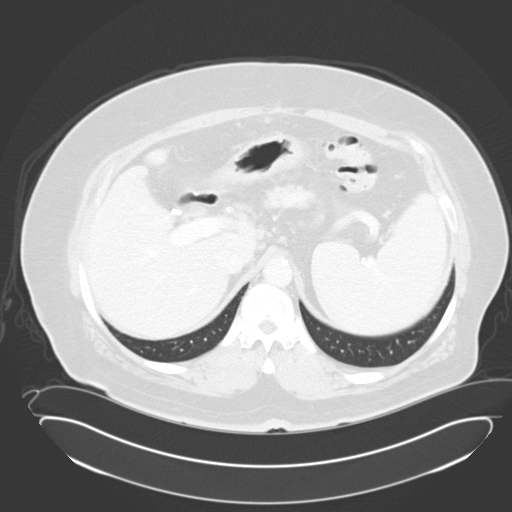
[im 84/101  lung]
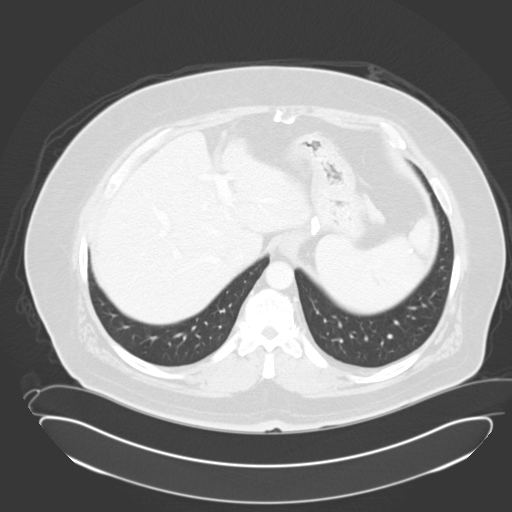
[im 89/101  soft-tissue]
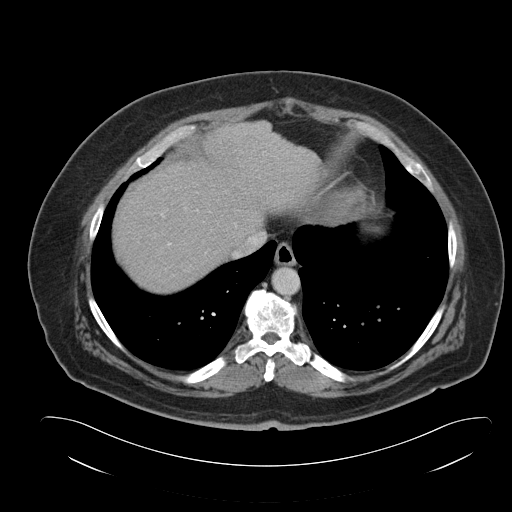
[im 89/101  lung]
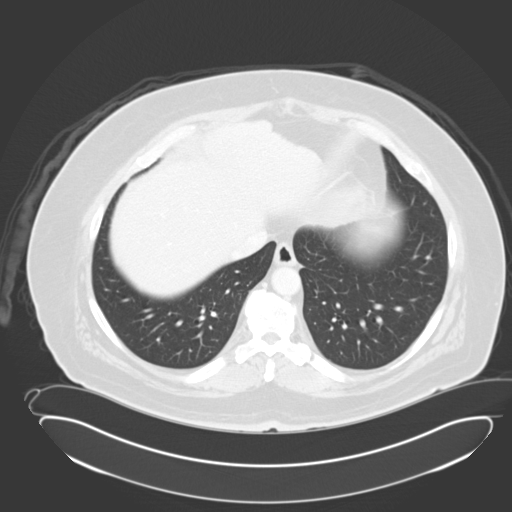
[im 95/101  soft-tissue]
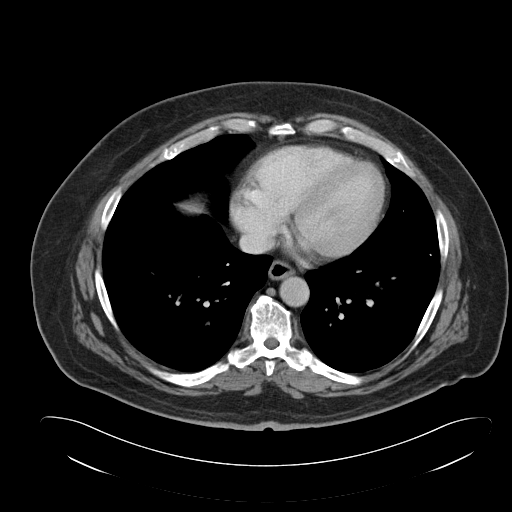
[im 95/101  lung]
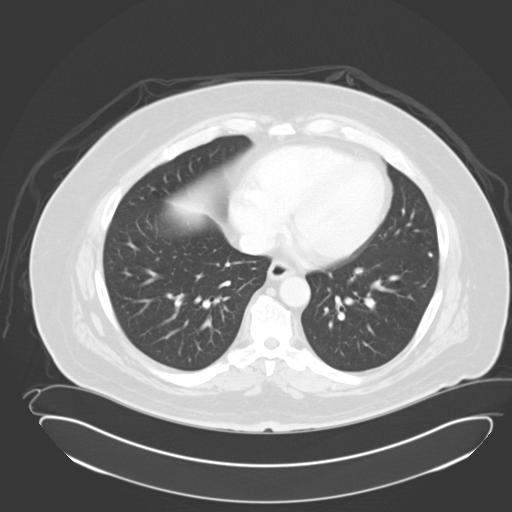

[14 of 32 positions shown; findings below may reference images not displayed]

FINDINGS: Lung bases are clear. No pleural or pericardial fluid. The liver is
normal. Previous cholecystectomy. Splenomegaly persists with a small
amount lateral capsular thickening unchanged since the previous
studies. The pancreas is normal. No worrisome adrenal finding. Fatty
mass of the right adrenal gland measuring 23 mm is unchanged. The
kidneys are normal. The aorta and IVC are normal.

Inflammatory changes related to sigmoid diverticulitis have
improved. There is less regional edema. No obstruction. No evidence
of developing abscess.

Bladder, prostate gland and seminal vesicles are unremarkable. No
acute bone finding.
IMPRESSION: Improving inflammatory change in the region of sigmoid
diverticulitis. No worsening. No abscess development.

No change otherwise with chronic splenomegaly and right adrenal
myelo lipoma.

These results will be called to the ordering clinician or
representative by the Radiologist Assistant, and communication
documented in the PACS or zVision Dashboard.

## 2017-02-06 ENCOUNTER — Ambulatory Visit (INDEPENDENT_AMBULATORY_CARE_PROVIDER_SITE_OTHER): Payer: Medicare Other | Admitting: Family Medicine

## 2017-02-06 ENCOUNTER — Encounter: Payer: Self-pay | Admitting: Family Medicine

## 2017-02-06 VITALS — BP 122/70 | HR 82 | Temp 97.9°F | Wt 330.0 lb

## 2017-02-06 DIAGNOSIS — I2510 Atherosclerotic heart disease of native coronary artery without angina pectoris: Secondary | ICD-10-CM | POA: Diagnosis not present

## 2017-02-06 DIAGNOSIS — E1143 Type 2 diabetes mellitus with diabetic autonomic (poly)neuropathy: Secondary | ICD-10-CM

## 2017-02-06 DIAGNOSIS — IMO0002 Reserved for concepts with insufficient information to code with codable children: Secondary | ICD-10-CM

## 2017-02-06 DIAGNOSIS — K3184 Gastroparesis: Secondary | ICD-10-CM | POA: Diagnosis not present

## 2017-02-06 DIAGNOSIS — E66813 Obesity, class 3: Secondary | ICD-10-CM

## 2017-02-06 DIAGNOSIS — H3562 Retinal hemorrhage, left eye: Secondary | ICD-10-CM | POA: Diagnosis not present

## 2017-02-06 DIAGNOSIS — E782 Mixed hyperlipidemia: Secondary | ICD-10-CM

## 2017-02-06 DIAGNOSIS — E1142 Type 2 diabetes mellitus with diabetic polyneuropathy: Secondary | ICD-10-CM

## 2017-02-06 DIAGNOSIS — I10 Essential (primary) hypertension: Secondary | ICD-10-CM

## 2017-02-06 DIAGNOSIS — E1165 Type 2 diabetes mellitus with hyperglycemia: Secondary | ICD-10-CM

## 2017-02-06 DIAGNOSIS — E11319 Type 2 diabetes mellitus with unspecified diabetic retinopathy without macular edema: Secondary | ICD-10-CM

## 2017-02-06 DIAGNOSIS — I200 Unstable angina: Secondary | ICD-10-CM

## 2017-02-06 NOTE — Progress Notes (Signed)
BP 122/70 (BP Location: Right Arm, Patient Position: Sitting, Cuff Size: Large)   Pulse 82   Temp 97.9 F (36.6 C) (Oral)   Wt (!) 330 lb (149.7 kg)   SpO2 97%   BMI 48.73 kg/m    CC: 1 mo f/u visit Subjective:    Patient ID: Chad [name], male    DOB: [birth year], 61 y.o.   MRN: [id number]  HPI: Chad [name] is a 61 y.o. male presenting on 02/06/2017 for 1 mo follow-up (BS is decreasing. Says vision in left eye is blurred even after surgery. Has eye appt 02/10/17. )   Recent catheterization with DES placed to mid LAD. Started on plavix 21m daily + atorvastatin 28m Planning to start cardiac [facility]  Increased blurry vision out of L eye 2 wks ago. He endorses photopsia and floaters over last 2 months but had not seeked care for this. He has cataract in R eye, but had cataract removed L eye. Now sees better out of his R eye. Upcoming eye appt later this week for evaluation ([facility]).   DM - does regularly check sugars low 100s fasting. Compliant with antihyperglycemic regimen which includes: amaryl 69m46maily, basaglar 25u daily, metformin 1000m30md. Denies low sugars or hypoglycemic symptoms. Denies paresthesias. Last diabetic eye exam pending. Pneumovax: 08/2011, 2008. Prevnar: not due. Glucometer brand: One-touch. DSME: completed. Lab Results  Component Value Date   HGBA1C 8.5 (H) 12/27/2016   Diabetic Foot Exam - Simple   No data filed     Lab Results  Component Value Date   MICROALBUR <0.7 09/15/2015     Relevant past medical, surgical, family and social history reviewed and updated as indicated. Interim medical history since our last visit reviewed. Allergies and medications reviewed and updated. Outpatient Medications Prior to Visit  Medication Sig Dispense Refill  . aspirin 81 MG chewable tablet Chew 81 mg by mouth daily.     . fluticasone (FLONASE) 50 MCG/ACT nasal spray PLACE 2 SPRAYS INTO BOTH NOSTRILS DAILY  16 g 2  . furosemide (LASIX) 40 MG tablet Take 1 tablet (40 mg total) daily by mouth. 30 tablet 3  . glimepiride (AMARYL) 2 MG tablet Take 2 tablets (4 mg total) by mouth daily with breakfast. 180 tablet 1  . Insulin Glargine (BASAGLAR KWIKPEN) 100 UNIT/ML SOPN Inject 0.35 mLs (35 Units total) into the skin at bedtime.    . Insulin Pen Needle (ULTICARE MINI PEN NEEDLES) 31G X 6 MM MISC USE AS DIRECTED FOR LANTUS SOLOSTAR PEN 100 each 3  . levothyroxine (SYNTHROID, LEVOTHROID) 112 MCG tablet Take 1 tablet (112 mcg total) by mouth daily. 90 tablet 3  . lisinopril (PRINIVIL,ZESTRIL) 20 MG tablet Take 1 tablet (20 mg total) by mouth daily. 90 tablet 3  . metFORMIN (GLUCOPHAGE) 1000 MG tablet TAKE 1 TABLET BY MOUTH TWICE (2) DAILY WITH A MEAL 180 tablet 0  . metoCLOPramide (REGLAN) 10 MG tablet TAKE 1 TABLET BY MOUTH 3 TIMES DAILY BEFORE MEALS 90 tablet 1  . metoprolol succinate (TOPROL-XL) 50 MG 24 hr tablet Take 50 mg daily by mouth. Take with or immediately following a meal.    . Multiple Vitamin (MULITIVITAMIN WITH MINERALS) TABS Take 1 tablet by mouth daily.    . NEMarland KitchenIUM 40 MG capsule Take 1 capsule (40 mg total) by mouth 2 (two) times daily. 180 capsule 3  . nitroGLYCERIN (NITROSTAT) 0.4 MG SL tablet Place 1 tablet (0.4  mg total) under the tongue every 5 (five) minutes as needed for chest pain. 60 tablet 3  . polyethylene glycol (MIRALAX / GLYCOLAX) packet 17grams in 16 oz of water twice a day until bowel movement.  LAXITIVE.  Restart if two days since last bowel movement 14 each 0  . potassium chloride (K-DUR,KLOR-CON) 10 MEQ tablet Take 10 mEq by mouth 2 (two) times daily.    Marland Kitchen ULTICARE MINI PEN NEEDLES 31G X 6 MM MISC     . VENTOLIN HFA 108 (90 Base) MCG/ACT inhaler INHALE 2 PUFFS INTO THE LUNGS EVERY 6 HOURS AS NEEDED FOR WHEEZING ORSHORTNESS OF BREATH 18 g 6  . vitamin E 400 UNIT capsule Take 400 Units by mouth daily.    . cyclobenzaprine (FLEXERIL) 5 MG tablet Take 1 tablet (5 mg total) 3  (three) times daily as needed by mouth for muscle spasms. 10 tablet 0  . Insulin Glargine (BASAGLAR KWIKPEN) 100 UNIT/ML SOPN Inject 0.25 mLs (25 Units total) into the skin at bedtime. 18 mL 3  . Red Yeast Rice 600 MG CAPS Take 1 capsule (600 mg total) by mouth 2 (two) times daily.    Marland Kitchen atorvastatin (LIPITOR) 20 MG tablet Take 1 tablet (20 mg total) by mouth daily.    Mariane Baumgarten Sodium (DSS) 100 MG CAPS 1 tab 2 times a day while on narcotics.  STOOL SOFTENER (Patient not taking: Reported on 02/06/2017) 60 each 0   No facility-administered medications prior to visit.      Per HPI unless specifically indicated in ROS section below Review of Systems     Objective:    BP 122/70 (BP Location: Right Arm, Patient Position: Sitting, Cuff Size: Large)   Pulse 82   Temp 97.9 F (36.6 C) (Oral)   Wt (!) 330 lb (149.7 kg)   SpO2 97%   BMI 48.73 kg/m   Wt Readings from Last 3 Encounters:  02/06/17 (!) 330 lb (149.7 kg)  01/09/17 (!) 324 lb (147 kg)  12/27/16 (!) 325 lb (147.4 kg)    Physical Exam  Constitutional: He appears well-developed and well-nourished. No distress.  HENT:  Head: Normocephalic and atraumatic.  Right Ear: External ear normal.  Left Ear: External ear normal.  Nose: Nose normal.  Mouth/Throat: Oropharynx is clear and moist. No oropharyngeal exudate.  Eyes: Conjunctivae and EOM are normal. Pupils are equal, round, and reactive to light. No scleral icterus.  No corneal opacity  Neck: Normal range of motion. Neck supple.  Cardiovascular: Normal rate, regular rhythm, normal heart sounds and intact distal pulses.  No murmur heard. Pulmonary/Chest: Effort normal and breath sounds normal. No respiratory distress. He has no wheezes. He has no rales.  Musculoskeletal: He exhibits no edema.  See HPI for foot exam if done  Lymphadenopathy:    He has no cervical adenopathy.  Skin: Skin is warm and dry. No rash noted.  Psychiatric: He has a normal mood and affect.  Nursing  note and vitals reviewed.  Results for orders placed or performed during the hospital encounter of 12/27/16  MRSA PCR Screening  Result Value Ref Range   MRSA by PCR NEGATIVE NEGATIVE  NM Myocar Multi W/Spect W/Wall Motion / EF  Result Value Ref Range   Rest HR 93 bpm   Rest BP 134/59 mmHg   Exercise duration (sec) 0 sec   Percent HR 66 %   Exercise duration (min) 0 min   Estimated workload 1.0 METS   Peak HR 105 bpm  Peak BP 123/49 mmHg   MPHR 159 bpm   SSS 0    SRS 2    SDS 3    TID 0.98    LV sys vol 40 mL   LV dias vol 118 62 - 150 mL  Basic metabolic panel  Result Value Ref Range   Sodium 133 (L) 135 - 145 mmol/L   Potassium 4.0 3.5 - 5.1 mmol/L   Chloride 99 (L) 101 - 111 mmol/L   CO2 21 (L) 22 - 32 mmol/L   Glucose, Bld 305 (H) 65 - 99 mg/dL   BUN 15 6 - 20 mg/dL   Creatinine, Ser 0.97 0.61 - 1.24 mg/dL   Calcium 9.1 8.9 - 10.3 mg/dL   GFR calc non Af Amer >60 >60 mL/min   GFR calc Af Amer >60 >60 mL/min   Anion gap 13 5 - 15  CBC  Result Value Ref Range   WBC 4.9 3.8 - 10.6 K/uL   RBC 5.34 4.40 - 5.90 MIL/uL   Hemoglobin 14.3 13.0 - 18.0 g/dL   HCT 43.3 40.0 - 52.0 %   MCV 81.2 80.0 - 100.0 fL   MCH 26.8 26.0 - 34.0 pg   MCHC 33.0 32.0 - 36.0 g/dL   RDW 14.8 (H) 11.5 - 14.5 %   Platelets 119 (L) 150 - 440 K/uL  Troponin I  Result Value Ref Range   Troponin I <0.03 <0.03 ng/mL  Brain natriuretic peptide  Result Value Ref Range   B Natriuretic Peptide 16.0 0.0 - 100.0 pg/mL  Glucose, capillary  Result Value Ref Range   Glucose-Capillary 166 (H) 65 - 99 mg/dL  APTT  Result Value Ref Range   aPTT 27 24 - 36 seconds  Protime-INR  Result Value Ref Range   Prothrombin Time 13.6 11.4 - 15.2 seconds   INR 1.05   Heparin level (unfractionated)  Result Value Ref Range   Heparin Unfractionated 0.11 (L) 0.30 - 0.70 IU/mL  Hemoglobin A1c  Result Value Ref Range   Hgb A1c MFr Bld 8.5 (H) 4.8 - 5.6 %   Mean Plasma Glucose 197.25 mg/dL  Troponin I    Result Value Ref Range   Troponin I <0.03 <0.03 ng/mL  Troponin I  Result Value Ref Range   Troponin I <0.03 <0.03 ng/mL  Troponin I  Result Value Ref Range   Troponin I <0.03 <0.03 ng/mL  Lipid panel  Result Value Ref Range   Cholesterol 189 0 - 200 mg/dL   Triglycerides 97 <150 mg/dL   HDL 47 >40 mg/dL   Total CHOL/HDL Ratio 4.0 RATIO   VLDL 19 0 - 40 mg/dL   LDL Cholesterol 123 (H) 0 - 99 mg/dL  Basic metabolic panel  Result Value Ref Range   Sodium 134 (L) 135 - 145 mmol/L   Potassium 3.8 3.5 - 5.1 mmol/L   Chloride 100 (L) 101 - 111 mmol/L   CO2 25 22 - 32 mmol/L   Glucose, Bld 273 (H) 65 - 99 mg/dL   BUN 17 6 - 20 mg/dL   Creatinine, Ser 1.10 0.61 - 1.24 mg/dL   Calcium 8.4 (L) 8.9 - 10.3 mg/dL   GFR calc non Af Amer >60 >60 mL/min   GFR calc Af Amer >60 >60 mL/min   Anion gap 9 5 - 15  CBC  Result Value Ref Range   WBC 6.9 3.8 - 10.6 K/uL   RBC 4.64 4.40 - 5.90 MIL/uL   Hemoglobin 12.5 (L)  13.0 - 18.0 g/dL   HCT 37.4 (L) 40.0 - 52.0 %   MCV 80.5 80.0 - 100.0 fL   MCH 27.0 26.0 - 34.0 pg   MCHC 33.5 32.0 - 36.0 g/dL   RDW 15.0 (H) 11.5 - 14.5 %   Platelets 108 (L) 150 - 440 K/uL  Glucose, capillary  Result Value Ref Range   Glucose-Capillary 121 (H) 65 - 99 mg/dL  Glucose, capillary  Result Value Ref Range   Glucose-Capillary 183 (H) 65 - 99 mg/dL  Heparin level (unfractionated)  Result Value Ref Range   Heparin Unfractionated 0.27 (L) 0.30 - 0.70 IU/mL  Glucose, capillary  Result Value Ref Range   Glucose-Capillary 324 (H) 65 - 99 mg/dL  Glucose, capillary  Result Value Ref Range   Glucose-Capillary 247 (H) 65 - 99 mg/dL  Glucose, capillary  Result Value Ref Range   Glucose-Capillary 277 (H) 65 - 99 mg/dL  Basic metabolic panel  Result Value Ref Range   Sodium 135 135 - 145 mmol/L   Potassium 3.6 3.5 - 5.1 mmol/L   Chloride 97 (L) 101 - 111 mmol/L   CO2 28 22 - 32 mmol/L   Glucose, Bld 286 (H) 65 - 99 mg/dL   BUN 18 6 - 20 mg/dL   Creatinine,  Ser 1.08 0.61 - 1.24 mg/dL   Calcium 8.8 (L) 8.9 - 10.3 mg/dL   GFR calc non Af Amer >60 >60 mL/min   GFR calc Af Amer >60 >60 mL/min   Anion gap 10 5 - 15  Glucose, capillary  Result Value Ref Range   Glucose-Capillary 278 (H) 65 - 99 mg/dL   Comment 1 Notify RN   Glucose, capillary  Result Value Ref Range   Glucose-Capillary 269 (H) 65 - 99 mg/dL  Glucose, capillary  Result Value Ref Range   Glucose-Capillary 200 (H) 65 - 99 mg/dL      Assessment & Plan:   Problem List Items Addressed This Visit    Coronary artery disease, non-occlusive (Chronic)    S/p recent DES to LAD. Feeling much better. Appreciate cards care. Seen at [facility]. Planned to establish with them in January and likely cardiac rehab. Aware not to exert himself after recent stent.       Relevant Medications   atorvastatin (LIPITOR) 20 MG tablet   Diabetic gastroparesis (HCC)   Relevant Medications   Insulin Glargine (BASAGLAR KWIKPEN) 100 UNIT/ML SOPN   atorvastatin (LIPITOR) 20 MG tablet   Diabetic neuropathy associated with type 2 diabetes mellitus (HCC)   Relevant Medications   Insulin Glargine (BASAGLAR KWIKPEN) 100 UNIT/ML SOPN   atorvastatin (LIPITOR) 20 MG tablet   HLD (hyperlipidemia)    Started on lipitor 47m daily and tolerating well -continue.       Relevant Medications   atorvastatin (LIPITOR) 20 MG tablet   HTN (hypertension) (Chronic)    Chronic, stable. Continue current regimen.       Relevant Medications   atorvastatin (LIPITOR) 20 MG tablet   Obesity, Class III, BMI 40-49.9 (morbid obesity) (HCC) (Chronic)   Relevant Medications   Insulin Glargine (BASAGLAR KWIKPEN) 100 UNIT/ML SOPN   Retinal hemorrhage of left eye    H/o this. Concern for recurrence or retinal detachment. Has upcoming appt for eval later this week.       Type 2 diabetes mellitus, uncontrolled, with retinopathy ([place]    Chronic, slowly improving. Too soon for A1c. RTC 2 mo f/u visit. Continue current  medications.  Relevant Medications   Insulin Glargine (BASAGLAR KWIKPEN) 100 UNIT/ML SOPN   atorvastatin (LIPITOR) 20 MG tablet       Follow up plan: Return in about 2 months (around 04/09/2017), or if symptoms worsen or fail to improve, for follow up visit.  [name], MD

## 2017-02-06 NOTE — Assessment & Plan Note (Addendum)
S/p recent DES to LAD. Feeling much better. Appreciate cards care. Seen at [facility]. Planned to establish with them in January and likely cardiac rehab. Aware not to exert himself after recent stent.

## 2017-02-06 NOTE — Assessment & Plan Note (Signed)
H/o this. Concern for recurrence or retinal detachment. Has upcoming appt for eval later this week.

## 2017-02-06 NOTE — Assessment & Plan Note (Signed)
Chronic, stable. Continue current regimen. 

## 2017-02-06 NOTE — Patient Instructions (Signed)
Good to see you today. Continue current medicines including basaglar 35u daily. Return in 2 months for follow up visit.

## 2017-02-06 NOTE — Assessment & Plan Note (Addendum)
Chronic, slowly improving. Too soon for A1c. RTC 2 mo f/u visit. Continue current medications.

## 2017-02-06 NOTE — Assessment & Plan Note (Signed)
Started on lipitor 14m daily and tolerating well -continue.

## 2017-02-10 DIAGNOSIS — Z9842 Cataract extraction status, left eye: Secondary | ICD-10-CM | POA: Diagnosis not present

## 2017-02-10 DIAGNOSIS — H2511 Age-related nuclear cataract, right eye: Secondary | ICD-10-CM | POA: Diagnosis not present

## 2017-02-10 DIAGNOSIS — H34812 Central retinal vein occlusion, left eye, with macular edema: Secondary | ICD-10-CM | POA: Diagnosis not present

## 2017-02-10 DIAGNOSIS — E113291 Type 2 diabetes mellitus with mild nonproliferative diabetic retinopathy without macular edema, right eye: Secondary | ICD-10-CM | POA: Diagnosis not present

## 2017-02-10 LAB — HM DIABETES EYE EXAM

## 2017-02-16 ENCOUNTER — Other Ambulatory Visit: Payer: Self-pay | Admitting: Family Medicine

## 2017-02-16 ENCOUNTER — Ambulatory Visit: Payer: Medicare Other | Admitting: Family Medicine

## 2017-02-20 ENCOUNTER — Encounter: Payer: Self-pay | Admitting: Family Medicine

## 2017-02-20 DIAGNOSIS — H4312 Vitreous hemorrhage, left eye: Secondary | ICD-10-CM | POA: Diagnosis not present

## 2017-02-20 DIAGNOSIS — H3562 Retinal hemorrhage, left eye: Secondary | ICD-10-CM | POA: Diagnosis not present

## 2017-02-20 DIAGNOSIS — H43811 Vitreous degeneration, right eye: Secondary | ICD-10-CM | POA: Diagnosis not present

## 2017-02-20 DIAGNOSIS — H34812 Central retinal vein occlusion, left eye, with macular edema: Secondary | ICD-10-CM | POA: Diagnosis not present

## 2017-03-03 DIAGNOSIS — H34812 Central retinal vein occlusion, left eye, with macular edema: Secondary | ICD-10-CM | POA: Diagnosis not present

## 2017-03-20 ENCOUNTER — Other Ambulatory Visit: Payer: Self-pay

## 2017-03-20 ENCOUNTER — Ambulatory Visit: Payer: Medicare Other | Admitting: Family Medicine

## 2017-03-20 NOTE — Telephone Encounter (Signed)
Chad [name] at [name] left v/m requesting new rx for basaglar [name]; pt advised pharmacy taking 40 units now;  Per 02/06/17 note pt was taking 35 units.Please advise.

## 2017-03-22 MED ORDER — BASAGLAR KWIKPEN 100 UNIT/ML ~~LOC~~ SOPN: 40.0000 [IU] | PEN_INJECTOR | Freq: Every day | SUBCUTANEOUS | 11 refills | Status: DC

## 2017-03-22 NOTE — Telephone Encounter (Signed)
Sent in

## 2017-03-24 DIAGNOSIS — H3562 Retinal hemorrhage, left eye: Secondary | ICD-10-CM | POA: Diagnosis not present

## 2017-03-24 DIAGNOSIS — H43811 Vitreous degeneration, right eye: Secondary | ICD-10-CM | POA: Diagnosis not present

## 2017-03-24 DIAGNOSIS — H4312 Vitreous hemorrhage, left eye: Secondary | ICD-10-CM | POA: Diagnosis not present

## 2017-03-24 DIAGNOSIS — H34812 Central retinal vein occlusion, left eye, with macular edema: Secondary | ICD-10-CM | POA: Diagnosis not present

## 2017-03-24 HISTORY — PX: CARDIAC CATHETERIZATION: SHX172

## 2017-03-27 ENCOUNTER — Other Ambulatory Visit: Payer: Self-pay | Admitting: Family Medicine

## 2017-04-03 DIAGNOSIS — F419 Anxiety disorder, unspecified: Secondary | ICD-10-CM | POA: Diagnosis present

## 2017-04-03 DIAGNOSIS — I10 Essential (primary) hypertension: Secondary | ICD-10-CM | POA: Diagnosis not present

## 2017-04-03 DIAGNOSIS — K7581 Nonalcoholic steatohepatitis (NASH): Secondary | ICD-10-CM | POA: Diagnosis present

## 2017-04-03 DIAGNOSIS — E119 Type 2 diabetes mellitus without complications: Secondary | ICD-10-CM | POA: Diagnosis present

## 2017-04-03 DIAGNOSIS — E785 Hyperlipidemia, unspecified: Secondary | ICD-10-CM | POA: Diagnosis not present

## 2017-04-03 DIAGNOSIS — Z9049 Acquired absence of other specified parts of digestive tract: Secondary | ICD-10-CM | POA: Diagnosis not present

## 2017-04-03 DIAGNOSIS — K219 Gastro-esophageal reflux disease without esophagitis: Secondary | ICD-10-CM | POA: Diagnosis not present

## 2017-04-03 DIAGNOSIS — Z9884 Bariatric surgery status: Secondary | ICD-10-CM | POA: Diagnosis not present

## 2017-04-03 DIAGNOSIS — I110 Hypertensive heart disease with heart failure: Secondary | ICD-10-CM | POA: Diagnosis not present

## 2017-04-03 DIAGNOSIS — R0602 Shortness of breath: Secondary | ICD-10-CM | POA: Diagnosis not present

## 2017-04-03 DIAGNOSIS — Z79891 Long term (current) use of opiate analgesic: Secondary | ICD-10-CM | POA: Diagnosis not present

## 2017-04-03 DIAGNOSIS — I5030 Unspecified diastolic (congestive) heart failure: Secondary | ICD-10-CM | POA: Diagnosis not present

## 2017-04-03 DIAGNOSIS — Z955 Presence of coronary angioplasty implant and graft: Secondary | ICD-10-CM | POA: Diagnosis not present

## 2017-04-03 DIAGNOSIS — E1169 Type 2 diabetes mellitus with other specified complication: Secondary | ICD-10-CM | POA: Diagnosis not present

## 2017-04-03 DIAGNOSIS — I2510 Atherosclerotic heart disease of native coronary artery without angina pectoris: Secondary | ICD-10-CM | POA: Diagnosis not present

## 2017-04-03 DIAGNOSIS — Z794 Long term (current) use of insulin: Secondary | ICD-10-CM | POA: Diagnosis not present

## 2017-04-03 DIAGNOSIS — E1165 Type 2 diabetes mellitus with hyperglycemia: Secondary | ICD-10-CM | POA: Diagnosis not present

## 2017-04-03 DIAGNOSIS — R0609 Other forms of dyspnea: Secondary | ICD-10-CM | POA: Diagnosis not present

## 2017-04-03 DIAGNOSIS — F329 Major depressive disorder, single episode, unspecified: Secondary | ICD-10-CM | POA: Diagnosis present

## 2017-04-03 DIAGNOSIS — I4510 Unspecified right bundle-branch block: Secondary | ICD-10-CM | POA: Diagnosis not present

## 2017-04-03 DIAGNOSIS — R0789 Other chest pain: Secondary | ICD-10-CM | POA: Diagnosis not present

## 2017-04-03 DIAGNOSIS — I509 Heart failure, unspecified: Secondary | ICD-10-CM | POA: Diagnosis not present

## 2017-04-03 DIAGNOSIS — R079 Chest pain, unspecified: Secondary | ICD-10-CM | POA: Diagnosis not present

## 2017-04-03 DIAGNOSIS — M79602 Pain in left arm: Secondary | ICD-10-CM | POA: Diagnosis present

## 2017-04-03 DIAGNOSIS — Z7982 Long term (current) use of aspirin: Secondary | ICD-10-CM | POA: Diagnosis not present

## 2017-04-03 DIAGNOSIS — E039 Hypothyroidism, unspecified: Secondary | ICD-10-CM | POA: Diagnosis present

## 2017-04-03 DIAGNOSIS — I5033 Acute on chronic diastolic (congestive) heart failure: Secondary | ICD-10-CM | POA: Diagnosis present

## 2017-04-03 DIAGNOSIS — J45909 Unspecified asthma, uncomplicated: Secondary | ICD-10-CM | POA: Diagnosis present

## 2017-04-03 DIAGNOSIS — I517 Cardiomegaly: Secondary | ICD-10-CM | POA: Diagnosis not present

## 2017-04-03 LAB — HEMOGLOBIN A1C: HEMOGLOBIN A1C: 7.6

## 2017-04-05 ENCOUNTER — Encounter: Payer: Self-pay | Admitting: Family Medicine

## 2017-04-08 MED ORDER — ISOSORBIDE MONONITRATE ER 30 MG PO TB24
120.00 mg | ORAL_TABLET | ORAL | Status: DC
Start: 2017-04-09 — End: 2017-04-08

## 2017-04-08 MED ORDER — ACETAMINOPHEN 325 MG PO TABS
650.00 mg | ORAL_TABLET | ORAL | Status: DC
Start: ? — End: 2017-04-08

## 2017-04-08 MED ORDER — SENNOSIDES 8.6 MG PO TABS
1.00 | ORAL_TABLET | ORAL | Status: DC
Start: ? — End: 2017-04-08

## 2017-04-08 MED ORDER — GENERIC EXTERNAL MEDICATION
Status: DC
Start: ? — End: 2017-04-08

## 2017-04-08 MED ORDER — PANTOPRAZOLE SODIUM 20 MG PO TBEC
40.00 mg | DELAYED_RELEASE_TABLET | ORAL | Status: DC
Start: 2017-04-08 — End: 2017-04-08

## 2017-04-08 MED ORDER — LIDOCAINE 5 % EX PTCH
1.00 | MEDICATED_PATCH | CUTANEOUS | Status: DC
Start: 2017-04-09 — End: 2017-04-08

## 2017-04-08 MED ORDER — OXYCODONE HCL 5 MG PO TABS
5.00 mg | ORAL_TABLET | ORAL | Status: DC
Start: ? — End: 2017-04-08

## 2017-04-08 MED ORDER — NITROGLYCERIN 0.4 MG SL SUBL
.40 mg | SUBLINGUAL_TABLET | SUBLINGUAL | Status: DC
Start: ? — End: 2017-04-08

## 2017-04-08 MED ORDER — LEVOTHYROXINE SODIUM 100 MCG PO TABS
100.00 | ORAL_TABLET | ORAL | Status: DC
Start: 2017-04-09 — End: 2017-04-08

## 2017-04-08 MED ORDER — ATORVASTATIN CALCIUM 80 MG PO TABS
80.00 mg | ORAL_TABLET | ORAL | Status: DC
Start: 2017-04-09 — End: 2017-04-08

## 2017-04-08 MED ORDER — GENERIC EXTERNAL MEDICATION
40.00 | Status: DC
Start: ? — End: 2017-04-08

## 2017-04-08 MED ORDER — DEXTROSE 50 % IV SOLN
12.50 | INTRAVENOUS | Status: DC
Start: ? — End: 2017-04-08

## 2017-04-08 MED ORDER — ASPIRIN 81 MG PO CHEW
81.00 mg | CHEWABLE_TABLET | ORAL | Status: DC
Start: 2017-04-09 — End: 2017-04-08

## 2017-04-08 MED ORDER — METOPROLOL SUCCINATE ER 25 MG PO TB24
75.00 mg | ORAL_TABLET | ORAL | Status: DC
Start: 2017-04-09 — End: 2017-04-08

## 2017-04-08 MED ORDER — CLOPIDOGREL BISULFATE 75 MG PO TABS
75.00 mg | ORAL_TABLET | ORAL | Status: DC
Start: 2017-04-09 — End: 2017-04-08

## 2017-04-08 MED ORDER — VENLAFAXINE HCL 75 MG PO TABS
75.00 mg | ORAL_TABLET | ORAL | Status: DC
Start: 2017-04-08 — End: 2017-04-08

## 2017-04-08 MED ORDER — AMLODIPINE BESYLATE 5 MG PO TABS
5.00 mg | ORAL_TABLET | ORAL | Status: DC
Start: 2017-04-09 — End: 2017-04-08

## 2017-04-08 MED ORDER — ACETAMINOPHEN 500 MG PO TABS
1000.00 mg | ORAL_TABLET | ORAL | Status: DC
Start: 2017-04-08 — End: 2017-04-08

## 2017-04-08 MED ORDER — METOCLOPRAMIDE HCL 10 MG PO TABS
10.00 mg | ORAL_TABLET | ORAL | Status: DC
Start: 2017-04-08 — End: 2017-04-08

## 2017-04-08 MED ORDER — GENERIC EXTERNAL MEDICATION
2.00 | Status: DC
Start: ? — End: 2017-04-08

## 2017-04-08 MED ORDER — POLYETHYLENE GLYCOL 3350 17 G PO PACK
17.00 | PACK | ORAL | Status: DC
Start: 2017-04-08 — End: 2017-04-08

## 2017-04-08 MED ORDER — INSULIN LISPRO 100 UNIT/ML ~~LOC~~ SOLN
0.00 | SUBCUTANEOUS | Status: DC
Start: 2017-04-08 — End: 2017-04-08

## 2017-04-10 ENCOUNTER — Ambulatory Visit (INDEPENDENT_AMBULATORY_CARE_PROVIDER_SITE_OTHER): Payer: Medicare Other | Admitting: Family Medicine

## 2017-04-10 ENCOUNTER — Encounter: Payer: Self-pay | Admitting: Family Medicine

## 2017-04-10 VITALS — BP 126/68 | HR 83 | Temp 97.8°F | Wt 318.5 lb

## 2017-04-10 DIAGNOSIS — E1165 Type 2 diabetes mellitus with hyperglycemia: Secondary | ICD-10-CM

## 2017-04-10 DIAGNOSIS — K7581 Nonalcoholic steatohepatitis (NASH): Secondary | ICD-10-CM

## 2017-04-10 DIAGNOSIS — K7460 Unspecified cirrhosis of liver: Secondary | ICD-10-CM

## 2017-04-10 DIAGNOSIS — E1143 Type 2 diabetes mellitus with diabetic autonomic (poly)neuropathy: Secondary | ICD-10-CM | POA: Diagnosis not present

## 2017-04-10 DIAGNOSIS — I5032 Chronic diastolic (congestive) heart failure: Secondary | ICD-10-CM | POA: Diagnosis not present

## 2017-04-10 DIAGNOSIS — R0602 Shortness of breath: Secondary | ICD-10-CM

## 2017-04-10 DIAGNOSIS — R079 Chest pain, unspecified: Secondary | ICD-10-CM

## 2017-04-10 DIAGNOSIS — K219 Gastro-esophageal reflux disease without esophagitis: Secondary | ICD-10-CM | POA: Diagnosis not present

## 2017-04-10 DIAGNOSIS — E1142 Type 2 diabetes mellitus with diabetic polyneuropathy: Secondary | ICD-10-CM

## 2017-04-10 DIAGNOSIS — I10 Essential (primary) hypertension: Secondary | ICD-10-CM

## 2017-04-10 DIAGNOSIS — IMO0002 Reserved for concepts with insufficient information to code with codable children: Secondary | ICD-10-CM

## 2017-04-10 DIAGNOSIS — K3184 Gastroparesis: Secondary | ICD-10-CM

## 2017-04-10 DIAGNOSIS — E11319 Type 2 diabetes mellitus with unspecified diabetic retinopathy without macular edema: Secondary | ICD-10-CM

## 2017-04-10 DIAGNOSIS — I2510 Atherosclerotic heart disease of native coronary artery without angina pectoris: Secondary | ICD-10-CM

## 2017-04-10 MED ORDER — ACETAMINOPHEN 325 MG PO TABS
650.00 mg | ORAL_TABLET | ORAL | Status: DC
Start: ? — End: 2017-04-10

## 2017-04-10 MED ORDER — SENNOSIDES 8.6 MG PO TABS
1.00 | ORAL_TABLET | ORAL | Status: DC
Start: ? — End: 2017-04-10

## 2017-04-10 MED ORDER — NEXIUM 40 MG PO CPDR: 40.0000 mg | DELAYED_RELEASE_CAPSULE | Freq: Two times a day (BID) | ORAL | 11 refills | Status: DC

## 2017-04-10 MED ORDER — BASAGLAR KWIKPEN 100 UNIT/ML ~~LOC~~ SOPN: 50.0000 [IU] | PEN_INJECTOR | Freq: Every day | SUBCUTANEOUS | 11 refills | Status: DC

## 2017-04-10 NOTE — Patient Instructions (Addendum)
Continue current medicines.  Good to see you today. Return to se me in 3 months for follow up visit.

## 2017-04-10 NOTE — Assessment & Plan Note (Addendum)
Chronic, continues improving based on better A1c on recent hospitalization. Pt has recently tapered basaglar to 50u daily, continues amaryl and metformin. Foot exam today.

## 2017-04-10 NOTE — Assessment & Plan Note (Addendum)
Reviewed recent hospitalization. Appreciate cards care. Continue new regimen. Cards f/u planned next week.

## 2017-04-10 NOTE — Assessment & Plan Note (Signed)
Chronic. Reviewed diuretic use. Encouraged limiting salt to <1.5gm/day

## 2017-04-10 NOTE — Assessment & Plan Note (Signed)
Chronic, stable on current regimen - continue.

## 2017-04-10 NOTE — Assessment & Plan Note (Addendum)
Congratulated on weight loss noted - motivated at ongoing efforts, limited by exertional deconditioning. Today's weight 318lbs

## 2017-04-10 NOTE — Assessment & Plan Note (Signed)
Recent hospitalization reviewed. Doubt MSK cause. ?GI vs cardiac cause (coronary vasospasm or small vessel disease). Continue aggressive medical management - recently started on high dose imdur and amlodipine. Consider ranexa.

## 2017-04-10 NOTE — Assessment & Plan Note (Signed)
Continue reglan 50m tid

## 2017-04-10 NOTE — Progress Notes (Signed)
BP 126/68 (BP Location: Right Arm, Patient Position: Sitting, Cuff Size: Large)   Pulse 83   Temp 97.8 F (36.6 C) (Oral)   Wt (!) 318 lb 8 oz (144.5 kg)   SpO2 96%   BMI 47.03 kg/m    CC: DM f/u visit Subjective:    Patient ID: Chad [name], male    DOB: Apr 20, 1955, 62 y.o.   MRN: [id number]  HPI: Chad [name] is a 62 y.o. male presenting on 04/10/2017 for Diabetes (2 mo follow-up.  Pt has increased to 50 units of Basaglar.) and Hospitalization Follow-up (Admitted to [facility] on 04/03/17, dx chest pain. Pt needs Nexium- brand name, not generic.)   Here with wife [name] Pulse.  Recent hospitalization at [facility] for progressive dyspnea and chest pain, records reviewed. Known CAD s/p PCI to mid LAD with DESx1 (12/2016). TnI and BNP were negative and TTE and LHC were unremarkable (elevated LVEDP). Chest pain did improve with nitro. imdur was increased to 198m daily, norvasc 544mwas started. ?coronary vasospasm vs small vessel coronary disease.  Possible GI component (esophageal spasm vs gastritis). Needs brand nexium not generic - advised to try BID dosing. He was referred to cardiac rehab. Future options - increase norvasc or start ranexa. Consider GI eval with manometry vs EGD.   F/u appt planned with cardiologist is Tues 2/26.   DM - does regularly check sugars 110-130, checks twice daily. Compliant with antihyperglycemic regimen which includes: basaglar 50u daily, metformin 100068mid, amaryl 4mg73mth breakfast. Denies low sugars or hypoglycemic symptoms. Denies paresthesias. Last diabetic eye exam 01/2017. Pneumovax: 08/2011. Prevnar: not due. Glucometer brand: one-touch. DSME: completed. Lab Results  Component Value Date   HGBA1C 7.6 04/03/2017  A1c 7.6% on recent admission.  Diabetic Foot Exam - Simple   Simple Foot Form Diabetic Foot exam was performed with the following findings:  Yes 04/10/2017 12:49 PM  Visual Inspection No deformities, no ulcerations, no other skin breakdown bilaterally:   Yes Sensation Testing Intact to touch and monofilament testing bilaterally:  Yes Pulse Check Posterior Tibialis and Dorsalis pulse intact bilaterally:  Yes Comments    Lab Results  Component Value Date   MICROALBUR <0.7 09/15/2015     Admit date: 04/03/2017 Discharge date: 04/08/2017  F/u phone call not done   Discharge Service: Geriatrics (MDA) Discharge to: Home  Discharge Diagnoses: Active Problems: Chest pain Dyspnea Essential hypertension CAD (coronary artery disease) HLD (hyperlipidemia) Diabetes mellitus, type 2 (CMS-HCC) Resolved Problems: * No resolved hospital problems. *  Relevant past medical, surgical, family and social history reviewed and updated as indicated. Interim medical history since our last visit reviewed. Allergies and medications reviewed and updated. Outpatient Medications Prior to Visit  Medication Sig Dispense Refill  . aspirin 81 MG chewable tablet Chew 81 mg by mouth daily.     . atMarland Kitchenrvastatin (LIPITOR) 20 MG tablet Take 1 tablet (20 mg total) by mouth daily.    . DoMariane Baumgartenium (DSS) 100 MG CAPS 1 tab 2 times a day while on narcotics.  STOOL SOFTENER 60 each 0  . fluticasone (FLONASE) 50 MCG/ACT nasal spray PLACE 2 SPRAYS INTO BOTH NOSTRILS DAILY 16 g 2  . furosemide (LASIX) 40 MG tablet Take 1 tablet (40 mg total) daily by mouth. 30 tablet 3  . glimepiride (AMARYL) 2 MG tablet Take 2 tablets (4 mg total) by mouth daily with breakfast. 180 tablet 1  . Insulin Pen Needle (ULTICARE MINI PEN NEEDLES) 31G X 6 MM MISC USE AS  DIRECTED FOR LANTUS SOLOSTAR PEN 100 each 3  . levothyroxine (SYNTHROID, LEVOTHROID) 112 MCG tablet Take 1 tablet (112 mcg total) by mouth daily. 90 tablet 3  . lisinopril (PRINIVIL,ZESTRIL) 20 MG tablet Take 1 tablet (20 mg total) by mouth daily. 90 tablet 3  . metFORMIN (GLUCOPHAGE) 1000 MG tablet TAKE 1 TABLET BY MOUTH TWICE A DAY WITH A MEAL 180 tablet 0  . metoCLOPramide (REGLAN) 10 MG tablet TAKE 1 TABLET BY MOUTH 3 TIMES  DAILY BEFORE MEALS 90 tablet 0  . metoprolol succinate (TOPROL-XL) 50 MG 24 hr tablet Take 50 mg daily by mouth. Take with or immediately following a meal.    . Multiple Vitamin (MULITIVITAMIN WITH MINERALS) TABS Take 1 tablet by mouth daily.    . nitroGLYCERIN (NITROSTAT) 0.4 MG SL tablet Place 1 tablet (0.4 mg total) under the tongue every 5 (five) minutes as needed for chest pain. 60 tablet 3  . polyethylene glycol (MIRALAX / GLYCOLAX) packet 17grams in 16 oz of water twice a day until bowel movement.  LAXITIVE.  Restart if two days since last bowel movement 14 each 0  . potassium chloride (K-DUR,KLOR-CON) 10 MEQ tablet Take 10 mEq by mouth 2 (two) times daily.    Marland Kitchen ULTICARE MINI PEN NEEDLES 31G X 6 MM MISC     . VENTOLIN HFA 108 (90 Base) MCG/ACT inhaler INHALE 2 PUFFS INTO THE LUNGS EVERY 6 HOURS AS NEEDED FOR WHEEZING ORSHORTNESS OF BREATH 18 g 6  . vitamin E 400 UNIT capsule Take 400 Units by mouth daily.    . Insulin Glargine (BASAGLAR KWIKPEN) 100 UNIT/ML SOPN Inject 0.4 mLs (40 Units total) into the skin at bedtime. (Patient taking differently: Inject 50 Units into the skin at bedtime. ) 4 pen 11  . NEXIUM 40 MG capsule TAKE 1 CAPSULE BY MOUTH TWICE DAILY 60 capsule 2  . amLODipine (NORVASC) 5 MG tablet Take 1 tablet (5 mg total) by mouth daily.    . isosorbide mononitrate (IMDUR) 120 MG 24 hr tablet Take 1 tablet (120 mg total) by mouth daily.    Marland Kitchen NEXIUM 40 MG capsule Take 1 capsule (40 mg total) by mouth 2 (two) times daily. 180 capsule 3   No facility-administered medications prior to visit.      Per HPI unless specifically indicated in ROS section below Review of Systems     Objective:    BP 126/68 (BP Location: Right Arm, Patient Position: Sitting, Cuff Size: Large)   Pulse 83   Temp 97.8 F (36.6 C) (Oral)   Wt (!) 318 lb 8 oz (144.5 kg)   SpO2 96%   BMI 47.03 kg/m   Wt Readings from Last 3 Encounters:  04/10/17 (!) 318 lb 8 oz (144.5 kg)  02/06/17 (!) 330 lb (149.7  kg)  01/09/17 (!) 324 lb (147 kg)    Physical Exam  Constitutional: He appears well-developed and well-nourished. No distress.  HENT:  Head: Normocephalic and atraumatic.  Right Ear: External ear normal.  Left Ear: External ear normal.  Nose: Nose normal.  Mouth/Throat: Oropharynx is clear and moist. No oropharyngeal exudate.  Eyes: Conjunctivae and EOM are normal. Pupils are equal, round, and reactive to light. No scleral icterus.  Neck: Normal range of motion. Neck supple.  Cardiovascular: Normal rate, regular rhythm, normal heart sounds and intact distal pulses.  No murmur heard. Pulmonary/Chest: Effort normal and breath sounds normal. No respiratory distress. He has no wheezes. He has no rales.  Musculoskeletal: He exhibits  no edema.  See HPI for foot exam if done  Lymphadenopathy:    He has no cervical adenopathy.  Skin: Skin is warm and dry. No rash noted.  Psychiatric: He has a normal mood and affect.  Nursing note and vitals reviewed.  Results for orders placed or performed in visit on 04/10/17  Hemoglobin A1c  Result Value Ref Range   Hemoglobin A1C 7.6    Lab Results  Component Value Date   TSH 2.31 11/08/2016      Assessment & Plan:   Problem List Items Addressed This Visit    Chest pain    Recent hospitalization reviewed. Doubt MSK cause. ?GI vs cardiac cause (coronary vasospasm or small vessel disease). Continue aggressive medical management - recently started on high dose imdur and amlodipine. Consider ranexa.       Coronary artery disease, non-occlusive (Chronic)    Reviewed recent hospitalization. Appreciate cards care. Continue new regimen. Cards f/u planned next week.       Relevant Medications   isosorbide mononitrate (IMDUR) 120 MG 24 hr tablet   amLODipine (NORVASC) 5 MG tablet   Diabetic gastroparesis (HCC)    Continue reglan 67m tid      Relevant Medications   Insulin Glargine (BASAGLAR KWIKPEN) 100 UNIT/ML SOPN   Diabetic neuropathy  associated with type 2 diabetes mellitus (HCC)   Relevant Medications   Insulin Glargine (BASAGLAR KWIKPEN) 100 UNIT/ML SOPN   Diastolic CHF, chronic (HCC) (Chronic)    Chronic. Reviewed diuretic use. Encouraged limiting salt to <1.5gm/day      Relevant Medications   isosorbide mononitrate (IMDUR) 120 MG 24 hr tablet   amLODipine (NORVASC) 5 MG tablet   Dyspnea    Planning to establish with cards rehab.       GERD (gastroesophageal reflux disease)    Chronic, severe. Needs brand nexium 441mBID.       Relevant Medications   NEXIUM 40 MG capsule   HTN (hypertension) (Chronic)    Chronic, stable on current regimen - continue.      Relevant Medications   isosorbide mononitrate (IMDUR) 120 MG 24 hr tablet   amLODipine (NORVASC) 5 MG tablet   Liver cirrhosis secondary to NASH (HCC) (Chronic)   Obesity, Class III, BMI 40-49.9 (morbid obesity) ([facility]ronic)    Congratulated on weight loss noted - motivated at ongoing efforts, limited by exertional deconditioning. Today's weight 318lbs      Relevant Medications   Insulin Glargine (BASAGLAR KWIKPEN) 100 UNIT/ML SOPN   Type 2 diabetes mellitus, uncontrolled, with retinopathy ([facility]- [facility]    Chronic, continues improving based on better A1c on recent hospitalization. Pt has recently tapered basaglar to 50u daily, continues amaryl and metformin. Foot exam today.       Relevant Medications   Insulin Glargine (BASAGLAR KWIKPEN) 100 UNIT/ML SOPN       Meds ordered this encounter  Medications  . NEXIUM 40 MG capsule    Sig: Take 1 capsule (40 mg total) by mouth 2 (two) times daily.    Dispense:  60 capsule    Refill:  11  . Insulin Glargine (BASAGLAR KWIKPEN) 100 UNIT/ML SOPN    Sig: Inject 0.5 mLs (50 Units total) into the skin at bedtime.    Dispense:  5 pen    Refill:  11   Orders Placed This Encounter  Procedures  . Hemoglobin A1c    This external order was created through the Results Console.    Follow up  plan:  Return in about 3 months (around 07/08/2017) for follow up visit.  [name], MD

## 2017-04-10 NOTE — Assessment & Plan Note (Signed)
Planning to establish with cards rehab.

## 2017-04-10 NOTE — Assessment & Plan Note (Signed)
Chronic, severe. Needs brand nexium 86m BID.

## 2017-04-18 DIAGNOSIS — Z7982 Long term (current) use of aspirin: Secondary | ICD-10-CM | POA: Diagnosis not present

## 2017-04-18 DIAGNOSIS — Z86718 Personal history of other venous thrombosis and embolism: Secondary | ICD-10-CM | POA: Diagnosis not present

## 2017-04-18 DIAGNOSIS — R9431 Abnormal electrocardiogram [ECG] [EKG]: Secondary | ICD-10-CM | POA: Diagnosis not present

## 2017-04-18 DIAGNOSIS — I5031 Acute diastolic (congestive) heart failure: Secondary | ICD-10-CM | POA: Diagnosis not present

## 2017-04-18 DIAGNOSIS — F419 Anxiety disorder, unspecified: Secondary | ICD-10-CM | POA: Diagnosis present

## 2017-04-18 DIAGNOSIS — I5030 Unspecified diastolic (congestive) heart failure: Secondary | ICD-10-CM | POA: Diagnosis not present

## 2017-04-18 DIAGNOSIS — Z7951 Long term (current) use of inhaled steroids: Secondary | ICD-10-CM | POA: Diagnosis not present

## 2017-04-18 DIAGNOSIS — Z794 Long term (current) use of insulin: Secondary | ICD-10-CM | POA: Diagnosis not present

## 2017-04-18 DIAGNOSIS — I10 Essential (primary) hypertension: Secondary | ICD-10-CM | POA: Diagnosis not present

## 2017-04-18 DIAGNOSIS — K219 Gastro-esophageal reflux disease without esophagitis: Secondary | ICD-10-CM | POA: Diagnosis present

## 2017-04-18 DIAGNOSIS — I110 Hypertensive heart disease with heart failure: Secondary | ICD-10-CM | POA: Diagnosis not present

## 2017-04-18 DIAGNOSIS — N179 Acute kidney failure, unspecified: Secondary | ICD-10-CM | POA: Diagnosis not present

## 2017-04-18 DIAGNOSIS — Z9049 Acquired absence of other specified parts of digestive tract: Secondary | ICD-10-CM | POA: Diagnosis not present

## 2017-04-18 DIAGNOSIS — Z79891 Long term (current) use of opiate analgesic: Secondary | ICD-10-CM | POA: Diagnosis not present

## 2017-04-18 DIAGNOSIS — E039 Hypothyroidism, unspecified: Secondary | ICD-10-CM | POA: Diagnosis not present

## 2017-04-18 DIAGNOSIS — I4510 Unspecified right bundle-branch block: Secondary | ICD-10-CM | POA: Diagnosis not present

## 2017-04-18 DIAGNOSIS — I5033 Acute on chronic diastolic (congestive) heart failure: Secondary | ICD-10-CM | POA: Diagnosis not present

## 2017-04-18 DIAGNOSIS — Z9884 Bariatric surgery status: Secondary | ICD-10-CM | POA: Diagnosis not present

## 2017-04-18 DIAGNOSIS — R0600 Dyspnea, unspecified: Secondary | ICD-10-CM | POA: Diagnosis not present

## 2017-04-18 DIAGNOSIS — R0602 Shortness of breath: Secondary | ICD-10-CM | POA: Diagnosis not present

## 2017-04-18 DIAGNOSIS — I440 Atrioventricular block, first degree: Secondary | ICD-10-CM | POA: Diagnosis not present

## 2017-04-18 DIAGNOSIS — I2510 Atherosclerotic heart disease of native coronary artery without angina pectoris: Secondary | ICD-10-CM | POA: Diagnosis not present

## 2017-04-18 DIAGNOSIS — R079 Chest pain, unspecified: Secondary | ICD-10-CM | POA: Diagnosis not present

## 2017-04-18 DIAGNOSIS — E119 Type 2 diabetes mellitus without complications: Secondary | ICD-10-CM | POA: Diagnosis not present

## 2017-04-18 DIAGNOSIS — R0789 Other chest pain: Secondary | ICD-10-CM | POA: Diagnosis not present

## 2017-04-18 DIAGNOSIS — E8770 Fluid overload, unspecified: Secondary | ICD-10-CM | POA: Diagnosis not present

## 2017-04-18 DIAGNOSIS — I200 Unstable angina: Secondary | ICD-10-CM | POA: Diagnosis not present

## 2017-04-18 DIAGNOSIS — Z6841 Body Mass Index (BMI) 40.0 and over, adult: Secondary | ICD-10-CM | POA: Diagnosis not present

## 2017-04-18 DIAGNOSIS — F329 Major depressive disorder, single episode, unspecified: Secondary | ICD-10-CM | POA: Diagnosis present

## 2017-04-18 DIAGNOSIS — E785 Hyperlipidemia, unspecified: Secondary | ICD-10-CM | POA: Diagnosis not present

## 2017-04-25 MED ORDER — INSULIN LISPRO 100 UNIT/ML ~~LOC~~ SOLN
10.00 | SUBCUTANEOUS | Status: DC
Start: 2017-04-25 — End: 2017-04-25

## 2017-04-25 MED ORDER — NITROGLYCERIN 0.4 MG SL SUBL
.40 mg | SUBLINGUAL_TABLET | SUBLINGUAL | Status: DC
Start: ? — End: 2017-04-25

## 2017-04-25 MED ORDER — GENERIC EXTERNAL MEDICATION
55.00 | Status: DC
Start: 2017-04-25 — End: 2017-04-25

## 2017-04-25 MED ORDER — ALBUTEROL SULFATE HFA 108 (90 BASE) MCG/ACT IN AERS
2.00 | INHALATION_SPRAY | RESPIRATORY_TRACT | Status: DC
Start: ? — End: 2017-04-25

## 2017-04-25 MED ORDER — AMLODIPINE BESYLATE 5 MG PO TABS
5.00 mg | ORAL_TABLET | ORAL | Status: DC
Start: 2017-04-26 — End: 2017-04-25

## 2017-04-25 MED ORDER — ASPIRIN 81 MG PO CHEW
81.00 mg | CHEWABLE_TABLET | ORAL | Status: DC
Start: 2017-04-26 — End: 2017-04-25

## 2017-04-25 MED ORDER — POLYETHYLENE GLYCOL 3350 17 G PO PACK
17.00 | PACK | ORAL | Status: DC
Start: 2017-04-25 — End: 2017-04-25

## 2017-04-25 MED ORDER — BISACODYL 5 MG PO TBEC
10.00 mg | DELAYED_RELEASE_TABLET | ORAL | Status: DC
Start: ? — End: 2017-04-25

## 2017-04-25 MED ORDER — PANTOPRAZOLE SODIUM 20 MG PO TBEC
40.00 mg | DELAYED_RELEASE_TABLET | ORAL | Status: DC
Start: 2017-04-26 — End: 2017-04-25

## 2017-04-25 MED ORDER — CLOPIDOGREL BISULFATE 75 MG PO TABS
75.00 mg | ORAL_TABLET | ORAL | Status: DC
Start: 2017-04-26 — End: 2017-04-25

## 2017-04-25 MED ORDER — DEXTROSE 50 % IV SOLN
12.50 | INTRAVENOUS | Status: DC
Start: ? — End: 2017-04-25

## 2017-04-25 MED ORDER — ISOSORBIDE MONONITRATE ER 30 MG PO TB24
120.00 mg | ORAL_TABLET | ORAL | Status: DC
Start: 2017-04-26 — End: 2017-04-25

## 2017-04-25 MED ORDER — INSULIN LISPRO 100 UNIT/ML ~~LOC~~ SOLN
0.00 | SUBCUTANEOUS | Status: DC
Start: 2017-04-25 — End: 2017-04-25

## 2017-04-25 MED ORDER — ENOXAPARIN SODIUM 40 MG/0.4ML ~~LOC~~ SOLN
40.00 mg | SUBCUTANEOUS | Status: DC
Start: 2017-04-25 — End: 2017-04-25

## 2017-04-25 MED ORDER — ACETAMINOPHEN 500 MG PO TABS
1000.00 mg | ORAL_TABLET | ORAL | Status: DC
Start: 2017-04-25 — End: 2017-04-25

## 2017-04-25 MED ORDER — GENERIC EXTERNAL MEDICATION
Status: DC
Start: ? — End: 2017-04-25

## 2017-04-25 MED ORDER — LIDOCAINE 5 % EX PTCH
1.00 | MEDICATED_PATCH | CUTANEOUS | Status: DC
Start: 2017-04-26 — End: 2017-04-25

## 2017-04-25 MED ORDER — ATORVASTATIN CALCIUM 20 MG PO TABS
20.00 mg | ORAL_TABLET | ORAL | Status: DC
Start: 2017-04-26 — End: 2017-04-25

## 2017-04-25 MED ORDER — FENTANYL CITRATE (PF) 2500 MCG/50ML IJ SOLN
50.00 | INTRAMUSCULAR | Status: DC
Start: ? — End: 2017-04-25

## 2017-04-25 MED ORDER — METOPROLOL SUCCINATE ER 25 MG PO TB24
50.00 mg | ORAL_TABLET | ORAL | Status: DC
Start: 2017-04-26 — End: 2017-04-25

## 2017-04-25 MED ORDER — SODIUM CHLORIDE 0.65 % NA SOLN
1.00 | NASAL | Status: DC
Start: ? — End: 2017-04-25

## 2017-04-25 MED ORDER — LEVOTHYROXINE SODIUM 112 MCG PO TABS
112.00 | ORAL_TABLET | ORAL | Status: DC
Start: 2017-04-26 — End: 2017-04-25

## 2017-04-25 MED ORDER — SENNOSIDES 8.6 MG PO TABS
2.00 | ORAL_TABLET | ORAL | Status: DC
Start: ? — End: 2017-04-25

## 2017-04-25 MED ORDER — CITALOPRAM HYDROBROMIDE 20 MG PO TABS
20.00 mg | ORAL_TABLET | ORAL | Status: DC
Start: 2017-04-26 — End: 2017-04-25

## 2017-04-25 MED ORDER — ONDANSETRON 4 MG PO TBDP
4.00 mg | ORAL_TABLET | ORAL | Status: DC
Start: ? — End: 2017-04-25

## 2017-04-27 ENCOUNTER — Other Ambulatory Visit: Payer: Self-pay | Admitting: Family Medicine

## 2017-04-28 ENCOUNTER — Encounter (HOSPITAL_COMMUNITY): Payer: Self-pay

## 2017-04-28 ENCOUNTER — Emergency Department (HOSPITAL_COMMUNITY): Payer: Medicare Other

## 2017-04-28 ENCOUNTER — Emergency Department (HOSPITAL_COMMUNITY)
Admission: EM | Admit: 2017-04-28 | Discharge: 2017-04-28 | Disposition: A | Discharge status: Home / Self Care | Payer: Medicare Other | Attending: Emergency Medicine | Admitting: Emergency Medicine

## 2017-04-28 DIAGNOSIS — Z96653 Presence of artificial knee joint, bilateral: Secondary | ICD-10-CM | POA: Diagnosis not present

## 2017-04-28 DIAGNOSIS — R0602 Shortness of breath: Secondary | ICD-10-CM | POA: Diagnosis not present

## 2017-04-28 DIAGNOSIS — I951 Orthostatic hypotension: Secondary | ICD-10-CM

## 2017-04-28 DIAGNOSIS — I110 Hypertensive heart disease with heart failure: Secondary | ICD-10-CM | POA: Insufficient documentation

## 2017-04-28 DIAGNOSIS — I2510 Atherosclerotic heart disease of native coronary artery without angina pectoris: Secondary | ICD-10-CM | POA: Diagnosis not present

## 2017-04-28 DIAGNOSIS — J45909 Unspecified asthma, uncomplicated: Secondary | ICD-10-CM | POA: Insufficient documentation

## 2017-04-28 DIAGNOSIS — E871 Hypo-osmolality and hyponatremia: Secondary | ICD-10-CM | POA: Diagnosis not present

## 2017-04-28 DIAGNOSIS — Z794 Long term (current) use of insulin: Secondary | ICD-10-CM | POA: Diagnosis not present

## 2017-04-28 DIAGNOSIS — E039 Hypothyroidism, unspecified: Secondary | ICD-10-CM | POA: Insufficient documentation

## 2017-04-28 DIAGNOSIS — I5032 Chronic diastolic (congestive) heart failure: Secondary | ICD-10-CM | POA: Insufficient documentation

## 2017-04-28 DIAGNOSIS — Z79899 Other long term (current) drug therapy: Secondary | ICD-10-CM | POA: Diagnosis not present

## 2017-04-28 DIAGNOSIS — R404 Transient alteration of awareness: Secondary | ICD-10-CM | POA: Diagnosis not present

## 2017-04-28 DIAGNOSIS — R55 Syncope and collapse: Secondary | ICD-10-CM | POA: Diagnosis present

## 2017-04-28 DIAGNOSIS — R42 Dizziness and giddiness: Secondary | ICD-10-CM | POA: Diagnosis not present

## 2017-04-28 DIAGNOSIS — Z7982 Long term (current) use of aspirin: Secondary | ICD-10-CM | POA: Insufficient documentation

## 2017-04-28 LAB — CBC WITH DIFFERENTIAL/PLATELET
Basophils Absolute: 0 10*3/uL (ref 0.0–0.1)
Basophils Relative: 0 %
EOS PCT: 1 %
Eosinophils Absolute: 0 10*3/uL (ref 0.0–0.7)
HCT: 38.1 % — ABNORMAL LOW (ref 39.0–52.0)
HEMOGLOBIN: 13.1 g/dL (ref 13.0–17.0)
LYMPHS PCT: 13 %
Lymphs Abs: 1 10*3/uL (ref 0.7–4.0)
MCH: 26.9 pg (ref 26.0–34.0)
MCHC: 34.4 g/dL (ref 30.0–36.0)
MCV: 78.2 fL (ref 78.0–100.0)
Monocytes Absolute: 0.6 10*3/uL (ref 0.1–1.0)
Monocytes Relative: 8 %
NEUTROS PCT: 78 %
Neutro Abs: 5.6 10*3/uL (ref 1.7–7.7)
Platelets: 154 10*3/uL (ref 150–400)
RBC: 4.87 MIL/uL (ref 4.22–5.81)
RDW: 13.4 % (ref 11.5–15.5)
WBC: 7.2 10*3/uL (ref 4.0–10.5)

## 2017-04-28 LAB — COMPREHENSIVE METABOLIC PANEL
ALK PHOS: 157 U/L — AB (ref 38–126)
ALT: 60 U/L (ref 17–63)
AST: 62 U/L — ABNORMAL HIGH (ref 15–41)
Albumin: 3.9 g/dL (ref 3.5–5.0)
Anion gap: 12 (ref 5–15)
BUN: 21 mg/dL — ABNORMAL HIGH (ref 6–20)
CALCIUM: 9.1 mg/dL (ref 8.9–10.3)
CO2: 18 mmol/L — ABNORMAL LOW (ref 22–32)
CREATININE: 1.19 mg/dL (ref 0.61–1.24)
Chloride: 93 mmol/L — ABNORMAL LOW (ref 101–111)
Glucose, Bld: 238 mg/dL — ABNORMAL HIGH (ref 65–99)
Potassium: 4.3 mmol/L (ref 3.5–5.1)
Sodium: 123 mmol/L — ABNORMAL LOW (ref 135–145)
Total Bilirubin: 1.3 mg/dL — ABNORMAL HIGH (ref 0.3–1.2)
Total Protein: 6.5 g/dL (ref 6.5–8.1)

## 2017-04-28 LAB — BRAIN NATRIURETIC PEPTIDE: B Natriuretic Peptide: 9.5 pg/mL (ref 0.0–100.0)

## 2017-04-28 LAB — URINALYSIS, ROUTINE W REFLEX MICROSCOPIC
BACTERIA UA: NONE SEEN
Bilirubin Urine: NEGATIVE
Glucose, UA: >=500 mg/dL — AB
Hgb urine dipstick: NEGATIVE
KETONES UR: NEGATIVE mg/dL
Leukocytes, UA: NEGATIVE
Nitrite: NEGATIVE
PH: 6 (ref 5.0–8.0)
Protein, ur: NEGATIVE mg/dL
RBC / HPF: NONE SEEN RBC/hpf (ref 0–5)
SPECIFIC GRAVITY, URINE: 1.007 (ref 1.005–1.030)
SQUAMOUS EPITHELIAL / LPF: NONE SEEN

## 2017-04-28 LAB — I-STAT TROPONIN, ED: TROPONIN I, POC: 0 ng/mL (ref 0.00–0.08)

## 2017-04-28 MED ORDER — ALBUTEROL SULFATE (2.5 MG/3ML) 0.083% IN NEBU: 5.0000 mg | INHALATION_SOLUTION | Freq: Once | RESPIRATORY_TRACT | Status: DC

## 2017-04-28 MED ORDER — ONDANSETRON HCL 4 MG/2ML IJ SOLN
4.0000 mg | Freq: Once | INTRAMUSCULAR | Status: AC
  Administered 2017-04-28: 4 mg via INTRAVENOUS
  Filled 2017-04-28: qty 2

## 2017-04-28 MED ORDER — SODIUM CHLORIDE 0.9 % IV BOLUS (SEPSIS)
500.0000 mL | Freq: Once | INTRAVENOUS | Status: AC
  Administered 2017-04-28: 500 mL via INTRAVENOUS

## 2017-04-28 NOTE — ED Triage Notes (Signed)
Pt brought in by [facility] from home c/o dizziness, SOB, and NV x36 hours. Pt states LOC yesterday, denies falling and hitting head. Per EMS pt was taken off a diuretic yesterday, hx of CHF. Per EMS pt has positive orthostatic vital changes, lungs clear, RR 28.

## 2017-04-28 NOTE — ED Notes (Signed)
Pt able to stand and walk a few steps. Denies nausea or dizziness. Ambulatory with steady gait. Approx 400 ml of fluid left to infuse

## 2017-04-28 NOTE — ED Notes (Signed)
Pt 100% on 2L [place]. O2 titrated down to 1L [place]. Pt remained at 100%

## 2017-04-28 NOTE — Discharge Instructions (Signed)
Hold off on bumex for now.   Drink slightly more water than usual but not too much.   See your cardiologist as scheduled on Monday. Repeat BMP (salt levels) when you see cardiology. Your sodium is 123 currently   Return to ER if you have worse shortness of breath, dizziness, passing out, chest pain
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
## 2017-04-28 NOTE — ED Notes (Signed)
Pt in XR at this time

## 2017-05-01 ENCOUNTER — Other Ambulatory Visit: Payer: Self-pay | Admitting: Family Medicine

## 2017-05-01 DIAGNOSIS — Z7902 Long term (current) use of antithrombotics/antiplatelets: Secondary | ICD-10-CM | POA: Diagnosis not present

## 2017-05-01 DIAGNOSIS — I240 Acute coronary thrombosis not resulting in myocardial infarction: Secondary | ICD-10-CM | POA: Diagnosis not present

## 2017-05-01 DIAGNOSIS — Z833 Family history of diabetes mellitus: Secondary | ICD-10-CM | POA: Diagnosis not present

## 2017-05-01 DIAGNOSIS — Z79899 Other long term (current) drug therapy: Secondary | ICD-10-CM | POA: Diagnosis not present

## 2017-05-01 DIAGNOSIS — E860 Dehydration: Secondary | ICD-10-CM | POA: Diagnosis not present

## 2017-05-01 DIAGNOSIS — I110 Hypertensive heart disease with heart failure: Secondary | ICD-10-CM | POA: Diagnosis not present

## 2017-05-01 DIAGNOSIS — Z794 Long term (current) use of insulin: Secondary | ICD-10-CM | POA: Diagnosis not present

## 2017-05-01 DIAGNOSIS — Z7982 Long term (current) use of aspirin: Secondary | ICD-10-CM | POA: Diagnosis not present

## 2017-05-01 DIAGNOSIS — Z8249 Family history of ischemic heart disease and other diseases of the circulatory system: Secondary | ICD-10-CM | POA: Diagnosis not present

## 2017-05-01 DIAGNOSIS — E785 Hyperlipidemia, unspecified: Secondary | ICD-10-CM | POA: Diagnosis not present

## 2017-05-01 DIAGNOSIS — I5030 Unspecified diastolic (congestive) heart failure: Secondary | ICD-10-CM | POA: Diagnosis not present

## 2017-05-01 DIAGNOSIS — I25110 Atherosclerotic heart disease of native coronary artery with unstable angina pectoris: Secondary | ICD-10-CM | POA: Diagnosis not present

## 2017-05-01 DIAGNOSIS — I5033 Acute on chronic diastolic (congestive) heart failure: Secondary | ICD-10-CM | POA: Diagnosis not present

## 2017-05-01 DIAGNOSIS — Z09 Encounter for follow-up examination after completed treatment for conditions other than malignant neoplasm: Secondary | ICD-10-CM | POA: Diagnosis not present

## 2017-05-01 DIAGNOSIS — E119 Type 2 diabetes mellitus without complications: Secondary | ICD-10-CM | POA: Diagnosis not present

## 2017-05-02 ENCOUNTER — Encounter: Payer: Self-pay | Admitting: Family Medicine

## 2017-05-02 ENCOUNTER — Ambulatory Visit (INDEPENDENT_AMBULATORY_CARE_PROVIDER_SITE_OTHER): Payer: Medicare Other | Admitting: Family Medicine

## 2017-05-02 VITALS — BP 130/62 | HR 74 | Temp 98.2°F | Wt 311.0 lb

## 2017-05-02 DIAGNOSIS — I2510 Atherosclerotic heart disease of native coronary artery without angina pectoris: Secondary | ICD-10-CM | POA: Diagnosis not present

## 2017-05-02 DIAGNOSIS — E11319 Type 2 diabetes mellitus with unspecified diabetic retinopathy without macular edema: Secondary | ICD-10-CM | POA: Diagnosis not present

## 2017-05-02 DIAGNOSIS — I5032 Chronic diastolic (congestive) heart failure: Secondary | ICD-10-CM

## 2017-05-02 DIAGNOSIS — K219 Gastro-esophageal reflux disease without esophagitis: Secondary | ICD-10-CM

## 2017-05-02 DIAGNOSIS — R079 Chest pain, unspecified: Secondary | ICD-10-CM | POA: Diagnosis not present

## 2017-05-02 DIAGNOSIS — E1165 Type 2 diabetes mellitus with hyperglycemia: Secondary | ICD-10-CM

## 2017-05-02 DIAGNOSIS — IMO0002 Reserved for concepts with insufficient information to code with codable children: Secondary | ICD-10-CM

## 2017-05-02 NOTE — Progress Notes (Signed)
BP 130/62 (BP Location: Left Arm, Patient Position: Sitting, Cuff Size: Large)   Pulse 74   Temp 98.2 F (36.8 C) (Oral)   Wt (!) 311 lb (141.1 kg)   SpO2 97%   BMI 42.18 kg/m    CC: hosp f/u visit Subjective:    Patient ID: Chad [name], male    DOB: 10-Mar-1955, 62 y.o.   MRN: [id number]  HPI: Chad [name] is a 62 y.o. male presenting on 05/02/2017 for Hospitalization Follow-up (Admitted to [facility] on 04/18/17, dx acute on chronic diastolic CHF.  Seen at [facility] ED on 04/28/17, dx orthostatic hypotension)   Recent complicated hospitalizations for ongoing exertional chest pain and dyspnea. Notes reviewed. Multiple hospitalizations in last 6 months with unstable angina s/p PCI to mLAD 12/2016, known HFpEF. Latest hospitalization late February for chest pain - found to have decompensated acute on chronic dCHF exacerbation, treated with diuresis (IV lasix 150mbid and metolazone), net negative 8L with resolution of chest pain after diuresis in hospital. Discharged on 272mbumex with PRN metolazone. Also discharged on celexa 2071maily for significant anxiety and imdur 120m51mily. Lisinopril was increased to 40mg56m(currently back on 20mg 69m), and Toprol 50mg d59m was continued. Spironolactone led to hyperkalemia so it was not continued at this time.   Recent ER visit for dehydration and orthostatic syncope - rehydrated with 1L NS. Has felt well since he's held bumex and metolazone.   [facility] blood work [facility] - recommended bumex 1mg BID79mN weight gain/leg swelling - has not needed this week. Holding metolazone for now as well. Discussing CardioMems implantable device to facilitate CHF management. F/u cards appt scheduled for 3/26.  They are now closely monitoring sodium intake - limited to 1.5gm/day. They are now weighing daily.  No more chest pain or dyspnea since latest hospital discharge. Narcotic has been able to be held.   GERD  - generic nexium doesn't work. Needs brand nexium.  DM - forgot log - brought in later, reviewed - wonderful possibly too restrictive control over the last week - fasting 83-100. Fasting cbg this morning 87, 91 yesterday. Checks daily to twice daily. Compliant with antihyperglycemic regimen including lantus 50 units daily and glimepiride 2mg 2 ta43mts with breakfast. Denies hypoglycemia. Yesterday's labs at cardiology office reviewed - glu 55.  Lab Results  Component Value Date   HGBA1C 7.6 04/03/2017    Admit Date: 04/18/2017 Discharge Date: 04/25/2017  Hosp f/u TCM phone call not completed - pt visited ER 04/28/2017.   Discharge Diagnoses: Principal Problem: Chest pain Active Problems: Dyspnea Essential hypertension Diabetes mellitus, type 2 (CMS-HCC) Acute on chronic diastolic congestive heart failure (CMS-HCC) Resolved Problems: * No resolved hospital problems. *  Relevant past medical, surgical, family and social history reviewed and updated as indicated. Interim medical history since our last visit reviewed.  Allergies and medications reviewed and updated. Discharge medication list reconciled with current medication list.  Outpatient Medications Prior to Visit  Medication Sig Dispense Refill  . amLODipine (NORVASC) 5 MG tablet Take 1 tablet (5 mg total) by mouth daily.    . aspirinMarland Kitchen81 MG chewable tablet Chew 81 mg by mouth daily.     . atorvasMarland Kitchenatin (LIPITOR) 20 MG tablet Take 1 tablet (20 mg total) by mouth daily.    . bumetanide (BUMEX) 2 MG tablet Take 0.5 tablets (1 mg total) by mouth 2 (two) times daily as needed (weight  gian/leg swelling).    . citalopram (CELEXA) 20 MG tablet Take 1 tablet (20 mg total) by mouth daily.    Mariane Baumgarten Sodium (DSS) 100 MG CAPS 1 tab 2 times a day while on narcotics.  STOOL SOFTENER 60 each 0  . fluticasone (FLONASE) 50 MCG/ACT nasal spray PLACE 2 SPRAYS INTO BOTH NOSTRILS DAILY 16 g 2  . glimepiride (AMARYL) 2 MG tablet Take 1 tablet (2 mg  total) by mouth daily as needed (hyperglycemia).    . Insulin Glargine (BASAGLAR KWIKPEN) 100 UNIT/ML SOPN Inject 0.5 mLs (50 Units total) into the skin at bedtime.    . Insulin Pen Needle (ULTICARE MINI PEN NEEDLES) 31G X 6 MM MISC USE AS DIRECTED FOR LANTUS SOLOSTAR PEN 100 each 3  . isosorbide mononitrate (IMDUR) 120 MG 24 hr tablet Take 1 tablet (120 mg total) by mouth daily.    Marland Kitchen levothyroxine (SYNTHROID, LEVOTHROID) 112 MCG tablet Take 1 tablet (112 mcg total) by mouth daily. 90 tablet 3  . lisinopril (PRINIVIL,ZESTRIL) 20 MG tablet Take 1 tablet (20 mg total) by mouth daily. 90 tablet 3  . metFORMIN (GLUCOPHAGE) 1000 MG tablet TAKE 1 TABLET BY MOUTH TWICE A DAY WITH A MEAL 180 tablet 0  . metoCLOPramide (REGLAN) 10 MG tablet TAKE 1 TABLET BY MOUTH 3 TIMES DAILY BEFORE MEALS 90 tablet 0  . metolazone (ZAROXOLYN) 2.5 MG tablet Take 1 tablet (2.5 mg total) by mouth daily as needed (for more than 3lb/day or 5lb/wk weight gain).    . metoprolol succinate (TOPROL-XL) 50 MG 24 hr tablet Take 50 mg daily by mouth. Take with or immediately following a meal.    . Multiple Vitamin (MULITIVITAMIN WITH MINERALS) TABS Take 1 tablet by mouth daily.    Marland Kitchen NEXIUM 40 MG capsule Take 1 capsule (40 mg total) by mouth 2 (two) times daily. 60 capsule 11  . nitroGLYCERIN (NITROSTAT) 0.4 MG SL tablet Place 1 tablet (0.4 mg total) under the tongue every 5 (five) minutes as needed for chest pain. 60 tablet 3  . polyethylene glycol (MIRALAX / GLYCOLAX) packet 17grams in 16 oz of water twice a day until bowel movement.  LAXITIVE.  Restart if two days since last bowel movement 14 each 0  . potassium chloride (K-DUR,KLOR-CON) 10 MEQ tablet Take 10 mEq by mouth 2 (two) times daily.    Marland Kitchen ULTICARE MINI PEN NEEDLES 31G X 6 MM MISC     . VENTOLIN HFA 108 (90 Base) MCG/ACT inhaler INHALE 2 PUFFS INTO THE LUNGS EVERY 6 HOURS AS NEEDED FOR WHEEZING ORSHORTNESS OF BREATH 18 g 6  . vitamin E 400 UNIT capsule Take 400 Units by mouth  daily.    . bumetanide (BUMEX) 2 MG tablet Take 2 mg by mouth daily. Takes 1/2 tablet as needed    . bumetanide (BUMEX) 2 MG tablet Take 0.5 tablets (1 mg total) by mouth 2 (two) times daily as needed (weight gian/leg swelling). Takes 1/2 tablet as needed    . furosemide (LASIX) 40 MG tablet Take 1 tablet (40 mg total) daily by mouth. 30 tablet 3  . glimepiride (AMARYL) 2 MG tablet TAKE 2 TABLETS BY MOUTH ONCE DAILY WITH BREAKFAST 180 tablet 0  . Insulin Glargine (BASAGLAR KWIKPEN) 100 UNIT/ML SOPN Inject 0.55 mLs (55 Units total) into the skin at bedtime. 5 pen    No facility-administered medications prior to visit.      Per HPI unless specifically indicated in ROS section below Review of Systems  Objective:    BP 130/62 (BP Location: Left Arm, Patient Position: Sitting, Cuff Size: Large)   Pulse 74   Temp 98.2 F (36.8 C) (Oral)   Wt (!) 311 lb (141.1 kg)   SpO2 97%   BMI 42.18 kg/m   Wt Readings from Last 3 Encounters:  05/02/17 (!) 311 lb (141.1 kg)  04/28/17 (!) 303 lb (137.4 kg)  04/10/17 (!) 318 lb 8 oz (144.5 kg)    Physical Exam  Constitutional: He appears well-developed and well-nourished. No distress.  HENT:  Head: Normocephalic and atraumatic.  Mouth/Throat: Oropharynx is clear and moist. No oropharyngeal exudate.  Eyes: Conjunctivae are normal. Pupils are equal, round, and reactive to light.  Cardiovascular: Normal rate, regular rhythm, normal heart sounds and intact distal pulses.  No murmur heard. Pulmonary/Chest: Effort normal and breath sounds normal. No respiratory distress. He has no wheezes. He has no rales.  Musculoskeletal: He exhibits no edema.  No edema appreciated  Skin: Skin is warm and dry. No rash noted.  Psychiatric: He has a normal mood and affect.  Nursing note and vitals reviewed.  Results for orders placed or performed during the hospital encounter of 04/28/17  CBC with Differential/Platelet  Result Value Ref Range   WBC 7.2 4.0 -  10.5 K/uL   RBC 4.87 4.22 - 5.81 MIL/uL   Hemoglobin 13.1 13.0 - 17.0 g/dL   HCT 38.1 (L) 39.0 - 52.0 %   MCV 78.2 78.0 - 100.0 fL   MCH 26.9 26.0 - 34.0 pg   MCHC 34.4 30.0 - 36.0 g/dL   RDW 13.4 11.5 - 15.5 %   Platelets 154 150 - 400 K/uL   Neutrophils Relative % 78 %   Neutro Abs 5.6 1.7 - 7.7 K/uL   Lymphocytes Relative 13 %   Lymphs Abs 1.0 0.7 - 4.0 K/uL   Monocytes Relative 8 %   Monocytes Absolute 0.6 0.1 - 1.0 K/uL   Eosinophils Relative 1 %   Eosinophils Absolute 0.0 0.0 - 0.7 K/uL   Basophils Relative 0 %   Basophils Absolute 0.0 0.0 - 0.1 K/uL  Comprehensive metabolic panel  Result Value Ref Range   Sodium 123 (L) 135 - 145 mmol/L   Potassium 4.3 3.5 - 5.1 mmol/L   Chloride 93 (L) 101 - 111 mmol/L   CO2 18 (L) 22 - 32 mmol/L   Glucose, Bld 238 (H) 65 - 99 mg/dL   BUN 21 (H) 6 - 20 mg/dL   Creatinine, Ser 1.19 0.61 - 1.24 mg/dL   Calcium 9.1 8.9 - 10.3 mg/dL   Total Protein 6.5 6.5 - 8.1 g/dL   Albumin 3.9 3.5 - 5.0 g/dL   AST 62 (H) 15 - 41 U/L   ALT 60 17 - 63 U/L   Alkaline Phosphatase 157 (H) 38 - 126 U/L   Total Bilirubin 1.3 (H) 0.3 - 1.2 mg/dL   GFR calc non Af Amer >60 >60 mL/min   GFR calc Af Amer >60 >60 mL/min   Anion gap 12 5 - 15  Brain natriuretic peptide  Result Value Ref Range   B Natriuretic Peptide 9.5 0.0 - 100.0 pg/mL  Urinalysis, Routine w reflex microscopic  Result Value Ref Range   Color, Urine YELLOW YELLOW   APPearance CLEAR CLEAR   Specific Gravity, Urine 1.007 1.005 - 1.030   pH 6.0 5.0 - 8.0   Glucose, UA >=500 (A) NEGATIVE mg/dL   Hgb urine dipstick NEGATIVE NEGATIVE   Bilirubin Urine NEGATIVE  NEGATIVE   Ketones, ur NEGATIVE NEGATIVE mg/dL   Protein, ur NEGATIVE NEGATIVE mg/dL   Nitrite NEGATIVE NEGATIVE   Leukocytes, UA NEGATIVE NEGATIVE   RBC / HPF NONE SEEN 0 - 5 RBC/hpf   WBC, UA 0-5 0 - 5 WBC/hpf   Bacteria, UA NONE SEEN NONE SEEN   Squamous Epithelial / LPF NONE SEEN NONE SEEN  I-stat troponin, ED  Result Value  Ref Range   Troponin i, poc 0.00 0.00 - 0.08 ng/mL   Comment 3              Assessment & Plan:   Problem List Items Addressed This Visit    Chest pain - Primary    This is largely controlled after latest diuresis - attributed to diastolic CHF exacerbation. Now on PRN bumex and has close cards f/u. Discussing cardiac rehab.       Chronic diastolic CHF (congestive heart failure) ([place])    Appreciate cards care. Difficult to tell fluid status in this patient, planned CardioMems implantable pulmonary artery device. Will continue current regimen which was reviewed with patient.       Relevant Medications   bumetanide (BUMEX) 2 MG tablet   GERD (gastroesophageal reflux disease)    Chronic, stable. Need brand name Nexium BID.       Type 2 diabetes mellitus, uncontrolled, with retinopathy ([place])    Reviewed sugar log - great readings, concern for overtreatment including yesterday's cards cbg 55. Advised hold glimepiride at this time, may re introduce 1 tablet 55m with breakfast if sugars start rising after hold. Continue metformin, basaglar 50u nightly.       Relevant Medications   glimepiride (AMARYL) 2 MG tablet   Insulin Glargine (BASAGLAR KWIKPEN) 100 UNIT/ML SOPN       No orders of the defined types were placed in this encounter.  No orders of the defined types were placed in this encounter.   Follow up plan: Return keep scheduled f/u 06/2017.  JRia Bush MD

## 2017-05-02 NOTE — Patient Instructions (Addendum)
Stop glimepiride. If sugars start rising, may restart 1 pill in the morning.  Continue other medicines.  I'm glad you're doing well. I agree with gradual increase in walking.

## 2017-05-03 NOTE — Assessment & Plan Note (Signed)
Appreciate cards care. Difficult to tell fluid status in this patient, planned CardioMems implantable pulmonary artery device. Will continue current regimen which was reviewed with patient.

## 2017-05-03 NOTE — Assessment & Plan Note (Signed)
This is largely controlled after latest diuresis - attributed to diastolic CHF exacerbation. Now on PRN bumex and has close cards f/u. Discussing cardiac rehab.

## 2017-05-03 NOTE — Assessment & Plan Note (Signed)
Reviewed sugar log - great readings, concern for overtreatment including yesterday's cards cbg 55. Advised hold glimepiride at this time, may re introduce 1 tablet 37m with breakfast if sugars start rising after hold. Continue metformin, basaglar 50u nightly.

## 2017-05-03 NOTE — Assessment & Plan Note (Addendum)
Chronic, stable. Need brand name Nexium BID.

## 2017-05-05 ENCOUNTER — Other Ambulatory Visit: Payer: Self-pay | Admitting: *Deleted

## 2017-05-05 DIAGNOSIS — I5032 Chronic diastolic (congestive) heart failure: Secondary | ICD-10-CM

## 2017-05-08 ENCOUNTER — Telehealth: Payer: Self-pay | Admitting: Family Medicine

## 2017-05-08 ENCOUNTER — Other Ambulatory Visit: Payer: Self-pay | Admitting: Family Medicine

## 2017-05-08 MED ORDER — METOCLOPRAMIDE HCL 10 MG PO TABS: ORAL_TABLET | ORAL | 6 refills | Status: DC

## 2017-05-08 NOTE — Telephone Encounter (Addendum)
This was not denied. I have refilled it.  plz notify pt. It looks like we never received a request.

## 2017-05-08 NOTE — Telephone Encounter (Addendum)
Relation to pt: self Call back number: [phone] Pharmacy: [facility] [place] [phone] (Phone) [phone] (Fax)     Reason for call:  Pharmacy informed patient metoCLOPramide (REGLAN) 10 MG tablet was denied, patient would like to know why, please advise

## 2017-05-08 NOTE — Telephone Encounter (Signed)
I did not see where Reglan had been denied and in the 05/02/17 Hospital f/u did not see Reglan mentioned; but name brand nexium was noted.Please advise. [place].

## 2017-05-08 NOTE — Addendum Note (Signed)
Addended by: [name] on: 05/08/2017 11:00 PM   Modules accepted: Orders

## 2017-05-09 NOTE — Telephone Encounter (Signed)
Spoke with [employer].  Says pt picked up Nexium rx yesterday.

## 2017-05-15 ENCOUNTER — Encounter: Payer: Medicare Other | Attending: Family Medicine

## 2017-05-15 ENCOUNTER — Other Ambulatory Visit: Payer: Self-pay

## 2017-05-15 ENCOUNTER — Other Ambulatory Visit: Payer: Self-pay | Admitting: Family Medicine

## 2017-05-15 VITALS — Ht 70.5 in | Wt 314.6 lb

## 2017-05-15 DIAGNOSIS — I5032 Chronic diastolic (congestive) heart failure: Secondary | ICD-10-CM | POA: Insufficient documentation

## 2017-05-15 NOTE — Progress Notes (Signed)
Pulmonary Individual Treatment Plan  Patient Details  Name: Chad [name] MRN: [id number] Date of Birth: [birth year] Referring Provider:     Pulmonary Rehab from 05/15/2017 in [facility] Cardiac and Pulmonary Rehab  Referring Provider  [name] MD      Initial Encounter Date:    Pulmonary Rehab from 05/15/2017 in [facility] Cardiac and Pulmonary Rehab  Date  05/15/17  Referring Provider  [name] MD      Visit Diagnosis: Heart failure, diastolic, chronic ([place])  Patient's Home Medications on Admission:  Current Outpatient Medications:  .  amLODipine (NORVASC) 5 MG tablet, Take 1 tablet (5 mg total) by mouth daily., Disp: , Rfl:  .  aspirin 81 MG chewable tablet, Chew 81 mg by mouth daily. , Disp: , Rfl:  .  atorvastatin (LIPITOR) 20 MG tablet, Take 1 tablet (20 mg total) by mouth daily., Disp: , Rfl:  .  bumetanide (BUMEX) 2 MG tablet, Take 0.5 tablets (1 mg total) by mouth 2 (two) times daily as needed (weight gian/leg swelling)., Disp: , Rfl:  .  citalopram (CELEXA) 20 MG tablet, TAKE 1 TABLET BY MOUTH ONCE A DAY, Disp: 30 tablet, Rfl: 0 .  Docusate Sodium (DSS) 100 MG CAPS, 1 tab 2 times a day while on narcotics.  STOOL SOFTENER, Disp: 60 each, Rfl: 0 .  fluticasone (FLONASE) 50 MCG/ACT nasal spray, PLACE 2 SPRAYS INTO BOTH NOSTRILS DAILY, Disp: 16 g, Rfl: 2 .  glimepiride (AMARYL) 2 MG tablet, Take 1 tablet (2 mg total) by mouth daily as needed (hyperglycemia)., Disp: , Rfl:  .  Insulin Glargine (BASAGLAR KWIKPEN) 100 UNIT/ML SOPN, Inject 0.5 mLs (50 Units total) into the skin at bedtime., Disp: , Rfl:  .  Insulin Pen Needle (ULTICARE MINI PEN NEEDLES) 31G X 6 MM MISC, USE AS DIRECTED FOR LANTUS SOLOSTAR PEN, Disp: 100 each, Rfl: 3 .  isosorbide mononitrate (IMDUR) 120 MG 24 hr tablet, Take 1 tablet (120 mg total) by mouth daily., Disp: , Rfl:  .  levothyroxine (SYNTHROID, LEVOTHROID) 112 MCG tablet, Take 1 tablet (112 mcg total) by mouth daily., Disp: 90 tablet, Rfl: 3 .   lisinopril (PRINIVIL,ZESTRIL) 20 MG tablet, Take 1 tablet (20 mg total) by mouth daily., Disp: 90 tablet, Rfl: 3 .  metFORMIN (GLUCOPHAGE) 1000 MG tablet, TAKE 1 TABLET BY MOUTH TWICE A DAY WITH A MEAL, Disp: 180 tablet, Rfl: 0 .  metoCLOPramide (REGLAN) 10 MG tablet, TAKE 1 TABLET BY MOUTH 3 TIMES DAILY BEFORE MEALS, Disp: 90 tablet, Rfl: 6 .  metolazone (ZAROXOLYN) 2.5 MG tablet, Take 1 tablet (2.5 mg total) by mouth daily as needed (for more than 3lb/day or 5lb/wk weight gain)., Disp: , Rfl:  .  metoprolol succinate (TOPROL-XL) 50 MG 24 hr tablet, Take 50 mg daily by mouth. Take with or immediately following a meal., Disp: , Rfl:  .  Multiple Vitamin (MULITIVITAMIN WITH MINERALS) TABS, Take 1 tablet by mouth daily., Disp: , Rfl:  .  NEXIUM 40 MG capsule, Take 1 capsule (40 mg total) by mouth 2 (two) times daily., Disp: 60 capsule, Rfl: 11 .  nitroGLYCERIN (NITROSTAT) 0.4 MG SL tablet, Place 1 tablet (0.4 mg total) under the tongue every 5 (five) minutes as needed for chest pain., Disp: 60 tablet, Rfl: 3 .  polyethylene glycol (MIRALAX / GLYCOLAX) packet, 17grams in 16 oz of water twice a day until bowel movement.  LAXITIVE.  Restart if two days since last bowel movement, Disp: 14 each, Rfl: 0 .  potassium  chloride (K-DUR,KLOR-CON) 10 MEQ tablet, Take 10 mEq by mouth 2 (two) times daily., Disp: , Rfl:  .  ULTICARE MINI PEN NEEDLES 31G X 6 MM MISC, , Disp: , Rfl:  .  VENTOLIN HFA 108 (90 Base) MCG/ACT inhaler, INHALE 2 PUFFS INTO THE LUNGS EVERY 6 HOURS AS NEEDED FOR WHEEZING ORSHORTNESS OF BREATH, Disp: 18 g, Rfl: 6 .  vitamin E 400 UNIT capsule, Take 400 Units by mouth daily., Disp: , Rfl:   Past Medical History: Past Medical History:  Diagnosis Date  . Abnormal drug screen    innaprop negative for hydrocodone 09/2013, inapprop negative for hydrocodone and tramadol 02/2014; inappropr negative hydrocodone 03/2015  . Acute diverticulitis 08/15/2014  . Arthritis    "both hips and knees; got shots in  each hip in August" (01/25/2013)  . Bone spur    L4 L5  . Bulging lumbar disc   . Coronary artery disease   . Diabetes mellitus without complication ([place])    no medicarions in over 2 years,wt. loss 100 lbs  . Diastolic CHF, chronic ([place]) 04/02/2012  . Diverticulosis   . Gastric bypass status for obesity 1985  . Gastritis 08/31/2015   with focal intestinal metaplasia  . GERD (gastroesophageal reflux disease)    severe, h/o gastritis and GI bleed, per pt normal EGD [facility] 2008  . History of diabetes mellitus 1990s   with mild background retinopathy, resolved with weight loss  . HLD (hyperlipidemia)    statin caused leg cramps  . HTN (hypertension)   . Hyperglycemia glucose over 300 in last 24 hrs 07/12/2016  . Hyperplastic colon polyp 2008  . Hypothyroid   . Internal hemorrhoids   . Morbid obesity ([place])   . Narrowing of lumbar spine   . OSA (obstructive sleep apnea)    unable to use CPAP as of last try 2/2 h/o tracheostomy?  . Otomycosis of right ear 07/06/2011  . Primary localized osteoarthritis of left knee 06/29/2016  . PVC (premature ventricular contraction)    RBBB Infer axis  . Right ear pain    s/p eval by ENT - thought TMJ referred pain and sent to oral surg for dental splint  . Seasonal allergies   . Sensorineural hearing loss, bilateral    hearing aides  . Splenomegaly   . Thrombocytopenia ([place]) 06/10/2015  . Thrombocytopenia ([place]) 06/10/2015   Platelet count dropped to 73 post op day 2 after total knee   . Tinnitus    due to sensorineural hearing loss R>L with ETD  . Trifascicular block  RBBB/LPFB/1AVB     Tobacco Use: Social History   Tobacco Use  Smoking Status Never Smoker  Smokeless Tobacco Never Used    Labs: Recent Review Flowsheet Data    Labs for ITP Cardiac and Pulmonary Rehab Latest Ref Rng & Units 06/08/2016 07/11/2016 11/08/2016 12/27/2016 04/03/2017   Cholestrol 0 - 200 mg/dL - - 171 189 -   LDLCALC 0 - 99 mg/dL - - 89 123(H) -   LDLDIRECT mg/dL - -  - - -   HDL >40 mg/dL - - 50.50 47 -   Trlycerides <150 mg/dL - - 158.0(H) 97 -   Hemoglobin A1c - 7.4(H) 6.9(H) 7.3(H) 8.5(H) 7.6   TCO2 0 - 100 mmol/L - - - - -       Pulmonary Assessment Scores: Pulmonary Assessment Scores    Row Name 05/15/17 1531         ADL UCSD   ADL Phase  Entry     SOB Score total  81     Rest  1     Walk  4     Stairs  5     Bath  3     Dress  4     Shop  4       CAT Score   CAT Score  27       mMRC Score   mMRC Score  2        Pulmonary Function Assessment: Pulmonary Function Assessment - 05/15/17 1431      Breath   Bilateral Breath Sounds  Clear    Shortness of Breath  Yes;Fear of Shortness of Breath;Limiting activity       Exercise Target Goals: Date: 05/15/17  Exercise Program Goal: Individual exercise prescription set using results from initial 6 min walk test and THRR while considering  patient's activity barriers and safety.    Exercise Prescription Goal: Initial exercise prescription builds to 30-45 minutes a day of aerobic activity, 2-3 days per week.  Home exercise guidelines will be given to patient during program as part of exercise prescription that the participant will acknowledge.  Activity Barriers & Risk Stratification: Activity Barriers & Cardiac Risk Stratification - 05/15/17 1525      Activity Barriers & Cardiac Risk Stratification   Activity Barriers  Deconditioning;Muscular Weakness;Shortness of Breath;Left Knee Replacement;Right Knee Replacement;Joint Problems;Chest Pain/Angina left shoulder rotator cuff repair       6 Minute Walk: 6 Minute Walk    Row Name 05/15/17 1522         6 Minute Walk   Phase  Initial     Distance  1025 feet     Walk Time  6 minutes     # of Rest Breaks  0     MPH  1.94     METS  1.73     RPE  13     Perceived Dyspnea   4     VO2 Peak  6.05     Symptoms  Yes (comment)     Comments  chest pain 6/10, SOB **took NTG 72mn prior to test**     Resting HR  61 bpm      Resting BP  116/58     Resting Oxygen Saturation   98 %     Exercise Oxygen Saturation  during 6 min walk  95 %     Max Ex. HR  77 bpm     Max Ex. BP  154/74       Interval HR   1 Minute HR  72     2 Minute HR  - error, machine not reading HR     6 Minute HR  77     2 Minute Post HR  67     Interval Heart Rate?  Yes       Interval Oxygen   Interval Oxygen?  Yes     Baseline Oxygen Saturation %  98 %     1 Minute Oxygen Saturation %  95 %     1 Minute Liters of Oxygen  0 L Room Air     2 Minute Oxygen Saturation %  95 %     2 Minute Liters of Oxygen  0 L     3 Minute Oxygen Saturation %  96 %     3 Minute Liters of Oxygen  0 L     4 Minute Oxygen Saturation %  97 %     4 Minute Liters of Oxygen  0 L     5 Minute Oxygen Saturation %  97 %     5 Minute Liters of Oxygen  0 L     6 Minute Oxygen Saturation %  98 %     6 Minute Liters of Oxygen  0 L     2 Minute Post Oxygen Saturation %  98 %     2 Minute Post Liters of Oxygen  0 L       Oxygen Initial Assessment: Oxygen Initial Assessment - 05/15/17 1432      Home Oxygen   Home Oxygen Device  None    Sleep Oxygen Prescription  None    Home Exercise Oxygen Prescription  None    Home at Rest Exercise Oxygen Prescription  None      Initial 6 min Walk   Oxygen Used  None      Program Oxygen Prescription   Program Oxygen Prescription  None      Intervention   Short Term Goals  To learn and understand importance of maintaining oxygen saturations>88%;To learn and demonstrate proper use of respiratory medications;To learn and demonstrate proper pursed lip breathing techniques or other breathing techniques.;To learn and understand importance of monitoring SPO2 with pulse oximeter and demonstrate accurate use of the pulse oximeter.    Long  Term Goals  Demonstrates proper use of MDI's;Compliance with respiratory medication;Exhibits proper breathing techniques, such as pursed lip breathing or other method taught during program  session;Maintenance of O2 saturations>88%;Verbalizes importance of monitoring SPO2 with pulse oximeter and return demonstration       Oxygen Re-Evaluation:   Oxygen Discharge (Final Oxygen Re-Evaluation):   Initial Exercise Prescription: Initial Exercise Prescription - 05/15/17 1500      Date of Initial Exercise RX and Referring Provider   Date  05/15/17    Referring Provider  [name] MD      Treadmill   MPH  1.7    Grade  0    Minutes  15    METs  2.3      Recumbant Bike   Level  1    RPM  50    Watts  12    Minutes  15    METs  2      NuStep   Level  1    SPM  80    Minutes  15    METs  2      Prescription Details   Frequency (times per week)  3    Duration  Progress to 45 minutes of aerobic exercise without signs/symptoms of physical distress      Intensity   THRR 40-80% of Max Heartrate  99-139    Ratings of Perceived Exertion  11-13    Perceived Dyspnea  0-4      Progression   Progression  Continue to progress workloads to maintain intensity without signs/symptoms of physical distress.      Resistance Training   Training Prescription  Yes    Weight  3 lbs    Reps  10-15       Perform Capillary Blood Glucose checks as needed.  Exercise Prescription Changes: Exercise Prescription Changes    Row Name 05/15/17 1500             Response to Exercise   Blood Pressure (Admit)  116/58       Blood Pressure (Exercise)  154/74  Blood Pressure (Exit)  124/74       Heart Rate (Admit)  61 bpm       Heart Rate (Exercise)  77 bpm       Heart Rate (Exit)  62 bpm       Oxygen Saturation (Admit)  98 %       Oxygen Saturation (Exercise)  95 %       Oxygen Saturation (Exit)  98 %       Rating of Perceived Exertion (Exercise)  13       Perceived Dyspnea (Exercise)  4       Symptoms  chest pain 6/10, SOB       Comments  walk test results          Exercise Comments:   Exercise Goals and Review: Exercise Goals    Row Name 05/15/17 1530               Exercise Goals   Increase Physical Activity  Yes       Intervention  Provide advice, education, support and counseling about physical activity/exercise needs.;Develop an individualized exercise prescription for aerobic and resistive training based on initial evaluation findings, risk stratification, comorbidities and participant's personal goals.       Expected Outcomes  Short Term: Attend rehab on a regular basis to increase amount of physical activity.;Long Term: Add in home exercise to make exercise part of routine and to increase amount of physical activity.;Long Term: Exercising regularly at least 3-5 days a week.       Increase Strength and Stamina  Yes       Intervention  Provide advice, education, support and counseling about physical activity/exercise needs.;Develop an individualized exercise prescription for aerobic and resistive training based on initial evaluation findings, risk stratification, comorbidities and participant's personal goals.       Expected Outcomes  Short Term: Increase workloads from initial exercise prescription for resistance, speed, and METs.;Short Term: Perform resistance training exercises routinely during rehab and add in resistance training at home;Long Term: Improve cardiorespiratory fitness, muscular endurance and strength as measured by increased METs and functional capacity (6MWT)       Able to understand and use rate of perceived exertion (RPE) scale  Yes       Intervention  Provide education and explanation on how to use RPE scale       Expected Outcomes  Short Term: Able to use RPE daily in rehab to express subjective intensity level;Long Term:  Able to use RPE to guide intensity level when exercising independently       Able to understand and use Dyspnea scale  Yes       Intervention  Provide education and explanation on how to use Dyspnea scale       Expected Outcomes  Short Term: Able to use Dyspnea scale daily in rehab to express  subjective sense of shortness of breath during exertion;Long Term: Able to use Dyspnea scale to guide intensity level when exercising independently       Knowledge and understanding of Target Heart Rate Range (THRR)  Yes       Intervention  Provide education and explanation of THRR including how the numbers were predicted and where they are located for reference       Expected Outcomes  Short Term: Able to state/look up THRR;Long Term: Able to use THRR to govern intensity when exercising independently;Short Term: Able to use daily as guideline for intensity in rehab  Able to check pulse independently  Yes       Intervention  Provide education and demonstration on how to check pulse in carotid and radial arteries.;Review the importance of being able to check your own pulse for safety during independent exercise       Expected Outcomes  Short Term: Able to explain why pulse checking is important during independent exercise;Long Term: Able to check pulse independently and accurately       Understanding of Exercise Prescription  Yes       Intervention  Provide education, explanation, and written materials on patient's individual exercise prescription       Expected Outcomes  Short Term: Able to explain program exercise prescription;Long Term: Able to explain home exercise prescription to exercise independently          Exercise Goals Re-Evaluation :   Discharge Exercise Prescription (Final Exercise Prescription Changes): Exercise Prescription Changes - 05/15/17 1500      Response to Exercise   Blood Pressure (Admit)  116/58    Blood Pressure (Exercise)  154/74    Blood Pressure (Exit)  124/74    Heart Rate (Admit)  61 bpm    Heart Rate (Exercise)  77 bpm    Heart Rate (Exit)  62 bpm    Oxygen Saturation (Admit)  98 %    Oxygen Saturation (Exercise)  95 %    Oxygen Saturation (Exit)  98 %    Rating of Perceived Exertion (Exercise)  13    Perceived Dyspnea (Exercise)  4    Symptoms   chest pain 6/10, SOB    Comments  walk test results       Nutrition:  Target Goals: Understanding of nutrition guidelines, daily intake of sodium <1563m, cholesterol <2052m calories 30% from fat and 7% or less from saturated fats, daily to have 5 or more servings of fruits and vegetables.  Biometrics: Pre Biometrics - 05/15/17 1531      Pre Biometrics   Height  5' 10.5" (1.791 m)    Weight  314 lb 9.6 oz (142.7 kg)  (Abnormal)     Waist Circumference  47 inches    Hip Circumference  54 inches    Waist to Hip Ratio  0.87 %    BMI (Calculated)  44.49        Nutrition Therapy Plan and Nutrition Goals: Nutrition Therapy & Goals - 05/15/17 1430      Personal Nutrition Goals   Comments  He wants to lose some weight. He is following a heart healthy diet at the moment.  Nutrition Appt has been scheduled for 4/9 at 1062m      Intervention Plan   Intervention  Prescribe, educate and counsel regarding individualized specific dietary modifications aiming towards targeted core components such as weight, hypertension, lipid management, diabetes, heart failure and other comorbidities.;Nutrition handout(s) given to patient.    Expected Outcomes  Short Term Goal: Understand basic principles of dietary content, such as calories, fat, sodium, cholesterol and nutrients.;Long Term Goal: Adherence to prescribed nutrition plan.       Nutrition Assessments: Nutrition Assessments - 05/15/17 1557      MEDFICTS Scores   Pre Score  35       Nutrition Goals Re-Evaluation:   Nutrition Goals Discharge (Final Nutrition Goals Re-Evaluation):   Psychosocial: Target Goals: Acknowledge presence or absence of significant depression and/or stress, maximize coping skills, provide positive support system. Participant is able to verbalize types and ability to use techniques and  skills needed for reducing stress and depression.   Initial Review & Psychosocial Screening: Initial Psych Review & Screening  - 05/15/17 1427      Initial Review   Current issues with  None Identified      Family Dynamics   Good Support System?  Yes    Comments  His wife, brother and 2 daughters are good for his support.      Barriers   Psychosocial barriers to participate in program  The patient should benefit from training in stress management and relaxation.      Screening Interventions   Interventions  Encouraged to exercise;Provide feedback about the scores to participant;Program counselor consult;To provide support and resources with identified psychosocial needs    Expected Outcomes  Short Term goal: Utilizing psychosocial counselor, staff and physician to assist with identification of specific Stressors or current issues interfering with healing process. Setting desired goal for each stressor or current issue identified.;Long Term Goal: Stressors or current issues are controlled or eliminated.;Short Term goal: Identification and review with participant of any Quality of Life or Depression concerns found by scoring the questionnaire.;Long Term goal: The participant improves quality of Life and PHQ9 Scores as seen by post scores and/or verbalization of changes       Quality of Life Scores:  Scores of 19 and below usually indicate a poorer quality of life in these areas.  A difference of  2-3 points is a clinically meaningful difference.  A difference of 2-3 points in the total score of the Quality of Life Index has been associated with significant improvement in overall quality of life, self-image, physical symptoms, and general health in studies assessing change in quality of life.  PHQ-9: Recent Review Flowsheet Data    Depression screen [facility] 2/9 05/15/2017 12/19/2016 11/08/2016 09/07/2016 04/28/2015   Decreased Interest 0 0 0 0 0   Down, Depressed, Hopeless 0 0 0 0 0   PHQ - 2 Score 0 0 0 0 0   Altered sleeping 0 - 0 - -   Tired, decreased energy 3 - 0 - -   Change in appetite 0 - 0 - -   Feeling bad or  failure about yourself  0 - 0 - -   Trouble concentrating 0 - 0 - -   Moving slowly or fidgety/restless 0 - 0 - -   Suicidal thoughts 0 - 0 - -   PHQ-9 Score 3 - 0 - -   Difficult doing work/chores Very difficult - Not difficult at all - -     Interpretation of Total Score  Total Score Depression Severity:  1-4 = Minimal depression, 5-9 = Mild depression, 10-14 = Moderate depression, 15-19 = Moderately severe depression, 20-27 = Severe depression   Psychosocial Evaluation and Intervention:   Psychosocial Re-Evaluation:   Psychosocial Discharge (Final Psychosocial Re-Evaluation):   Education: Education Goals: Education classes will be provided on a weekly basis, covering required topics. Participant will state understanding/return demonstration of topics presented.  Learning Barriers/Preferences: Learning Barriers/Preferences - 05/15/17 1432      Learning Barriers/Preferences   Learning Barriers  None    Learning Preferences  None       Education Topics:  Initial Evaluation Education: - Verbal, written and demonstration of respiratory meds, oximetry and breathing techniques. Instruction on use of nebulizers and MDIs and importance of monitoring MDI activations.   Pulmonary Rehab from 05/15/2017 in [facility] Cardiac and Pulmonary Rehab  Date  05/15/17  Educator  [facility]  Instruction  Review Code  1- Verbalizes Understanding      General Nutrition Guidelines/Fats and Fiber: -Group instruction provided by verbal, written material, models and posters to present the general guidelines for heart healthy nutrition. Gives an explanation and review of dietary fats and fiber.   Controlling Sodium/Reading Food Labels: -Group verbal and written material supporting the discussion of sodium use in heart healthy nutrition. Review and explanation with models, verbal and written materials for utilization of the food label.   Exercise Physiology & General Exercise Guidelines: - Group verbal and  written instruction with models to review the exercise physiology of the cardiovascular system and associated critical values. Provides general exercise guidelines with specific guidelines to those with heart or lung disease.    Aerobic Exercise & Resistance Training: - Gives group verbal and written instruction on the various components of exercise. Focuses on aerobic and resistive training programs and the benefits of this training and how to safely progress through these programs.   Flexibility, Balance, Mind/Body Relaxation: Provides group verbal/written instruction on the benefits of flexibility and balance training, including mind/body exercise modes such as yoga, pilates and tai chi.  Demonstration and skill practice provided.   Stress and Anxiety: - Provides group verbal and written instruction about the health risks of elevated stress and causes of high stress.  Discuss the correlation between heart/lung disease and anxiety and treatment options. Review healthy ways to manage with stress and anxiety.   Depression: - Provides group verbal and written instruction on the correlation between heart/lung disease and depressed mood, treatment options, and the stigmas associated with seeking treatment.   Exercise & Equipment Safety: - Individual verbal instruction and demonstration of equipment use and safety with use of the equipment.   Pulmonary Rehab from 05/15/2017 in [facility] Cardiac and Pulmonary Rehab  Date  05/15/17  Educator  [facility]  Instruction Review Code  1- Verbalizes Understanding      Infection Prevention: - Provides verbal and written material to individual with discussion of infection control including proper hand washing and proper equipment cleaning during exercise session.   Pulmonary Rehab from 05/15/2017 in [facility] Cardiac and Pulmonary Rehab  Date  05/15/17  Educator  [facility]  Instruction Review Code  1- Verbalizes Understanding      Falls Prevention: - Provides verbal and  written material to individual with discussion of falls prevention and safety.   Pulmonary Rehab from 05/15/2017 in [facility] Cardiac and Pulmonary Rehab  Date  05/15/17  Educator  [facility]  Instruction Review Code  1- Verbalizes Understanding      Diabetes: - Individual verbal and written instruction to review signs/symptoms of diabetes, desired ranges of glucose level fasting, after meals and with exercise. Advice that pre and post exercise glucose checks will be done for 3 sessions at entry of program.   Chronic Lung Diseases: - Group verbal and written instruction to review updates, respiratory medications, advancements in procedures and treatments. Discuss use of supplemental oxygen including available portable oxygen systems, continuous and intermittent flow rates, concentrators, personal use and safety guidelines. Review proper use of inhaler and spacers. Provide informative websites for self-education.    Energy Conservation: - Provide group verbal and written instruction for methods to conserve energy, plan and organize activities. Instruct on pacing techniques, use of adaptive equipment and posture/positioning to relieve shortness of breath.   Triggers and Exacerbations: - Group verbal and written instruction to review types of environmental triggers and ways to prevent exacerbations. Discuss weather changes, air quality and the  benefits of nasal washing. Review warning signs and symptoms to help prevent infections. Discuss techniques for effective airway clearance, coughing, and vibrations.   AED/CPR: - Group verbal and written instruction with the use of models to demonstrate the basic use of the AED with the basic ABC's of resuscitation.   Anatomy and Physiology of the Lungs: - Group verbal and written instruction with the use of models to provide basic lung anatomy and physiology related to function, structure and complications of lung disease.   Anatomy & Physiology of the  Heart: - Group verbal and written instruction and models provide basic cardiac anatomy and physiology, with the coronary electrical and arterial systems. Review of Valvular disease and Heart Failure   Cardiac Medications: - Group verbal and written instruction to review commonly prescribed medications for heart disease. Reviews the medication, class of the drug, and side effects.   Know Your Numbers and Risk Factors: -Group verbal and written instruction about important numbers in your health.  Discussion of what are risk factors and how they play a role in the disease process.  Review of Cholesterol, Blood Pressure, Diabetes, and BMI and the role they play in your overall health.   Sleep Hygiene: -Provides group verbal and written instruction about how sleep can affect your health.  Define sleep hygiene, discuss sleep cycles and impact of sleep habits. Review good sleep hygiene tips.    Other: -Provides group and verbal instruction on various topics (see comments)    Knowledge Questionnaire Score: Knowledge Questionnaire Score - 05/15/17 1431      Knowledge Questionnaire Score   Pre Score  13/18 reviewed with patient        Core Components/Risk Factors/Patient Goals at Admission: Personal Goals and Risk Factors at Admission - 05/15/17 1433      Core Components/Risk Factors/Patient Goals on Admission    Weight Management  Yes;Weight Loss;Obesity    Intervention  Weight Management: Develop a combined nutrition and exercise program designed to reach desired caloric intake, while maintaining appropriate intake of nutrient and fiber, sodium and fats, and appropriate energy expenditure required for the weight goal.;Weight Management: Provide education and appropriate resources to help participant work on and attain dietary goals.;Weight Management/Obesity: Establish reasonable short term and long term weight goals.;Obesity: Provide education and appropriate resources to help participant  work on and attain dietary goals.    Admit Weight  314 lb 9.6 oz (142.7 kg)    Goal Weight: Short Term  310 lb (140.6 kg)    Goal Weight: Long Term  304 lb (137.9 kg)    Expected Outcomes  Short Term: Continue to assess and modify interventions until short term weight is achieved;Long Term: Adherence to nutrition and physical activity/exercise program aimed toward attainment of established weight goal;Weight Maintenance: Understanding of the daily nutrition guidelines, which includes 25-35% calories from fat, 7% or less cal from saturated fats, less than 246m cholesterol, less than 1.5gm of sodium, & 5 or more servings of fruits and vegetables daily;Weight Loss: Understanding of general recommendations for a balanced deficit meal plan, which promotes 1-2 lb weight loss per week and includes a negative energy balance of [phone] kcal/d;Understanding recommendations for meals to include 15-35% energy as protein, 25-35% energy from fat, 35-60% energy from carbohydrates, less than 2055mof dietary cholesterol, 20-35 gm of total fiber daily;Understanding of distribution of calorie intake throughout the day with the consumption of 4-5 meals/snacks    Improve shortness of breath with ADL's  Yes    Intervention  Provide education, individualized exercise plan and daily activity instruction to help decrease symptoms of SOB with activities of daily living.    Expected Outcomes  Short Term: Improve cardiorespiratory fitness to achieve a reduction of symptoms when performing ADLs;Long Term: Be able to perform more ADLs without symptoms or delay the onset of symptoms    Diabetes  Yes    Intervention  Provide education about signs/symptoms and action to take for hypo/hyperglycemia.;Provide education about proper nutrition, including hydration, and aerobic/resistive exercise prescription along with prescribed medications to achieve blood glucose in normal ranges: Fasting glucose 65-99 mg/dL    Expected Outcomes  Short  Term: Participant verbalizes understanding of the signs/symptoms and immediate care of hyper/hypoglycemia, proper foot care and importance of medication, aerobic/resistive exercise and nutrition plan for blood glucose control.;Long Term: Attainment of HbA1C < 7%.    Heart Failure  Yes    Intervention  Provide a combined exercise and nutrition program that is supplemented with education, support and counseling about heart failure. Directed toward relieving symptoms such as shortness of breath, decreased exercise tolerance, and extremity edema.    Expected Outcomes  Improve functional capacity of life;Short term: Attendance in program 2-3 days a week with increased exercise capacity. Reported lower sodium intake. Reported increased fruit and vegetable intake. Reports medication compliance.;Short term: Daily weights obtained and reported for increase. Utilizing diuretic protocols set by physician.;Long term: Adoption of self-care skills and reduction of barriers for early signs and symptoms recognition and intervention leading to self-care maintenance.    Hypertension  Yes    Intervention  Provide education on lifestyle modifcations including regular physical activity/exercise, weight management, moderate sodium restriction and increased consumption of fresh fruit, vegetables, and low fat dairy, alcohol moderation, and smoking cessation.;Monitor prescription use compliance.    Expected Outcomes  Short Term: Continued assessment and intervention until BP is < 140/31m HG in hypertensive participants. < 130/868mHG in hypertensive participants with diabetes, heart failure or chronic kidney disease.;Long Term: Maintenance of blood pressure at goal levels.    Lipids  Yes    Intervention  Provide education and support for participant on nutrition & aerobic/resistive exercise along with prescribed medications to achieve LDL <70104mHDL >86m68m  Expected Outcomes  Short Term: Participant states understanding of  desired cholesterol values and is compliant with medications prescribed. Participant is following exercise prescription and nutrition guidelines.;Long Term: Cholesterol controlled with medications as prescribed, with individualized exercise RX and with personalized nutrition plan. Value goals: LDL < 70mg30mL > 40 mg.       Core Components/Risk Factors/Patient Goals Review:    Core Components/Risk Factors/Patient Goals at Discharge (Final Review):    ITP Comments: ITP Comments    Row Name 05/15/17 1404           ITP Comments  Medical Evaluation completed. Chart sent for review and changes to Dr. Mark Emily Filbertctor of [facility]          Comments: Initial ITP

## 2017-05-15 NOTE — Patient Instructions (Signed)
Patient Instructions  Patient Details  Name: Chad [name] MRN: [id number] Date of Birth: [birth year] Referring Provider:  [name], MD  Below are your personal goals for exercise, nutrition, and risk factors. Our goal is to help you stay on track towards obtaining and maintaining these goals. We will be discussing your progress on these goals with you throughout the program.  Initial Exercise Prescription: Initial Exercise Prescription - 05/15/17 1500      Date of Initial Exercise RX and Referring Provider   Date  05/15/17    Referring Provider  [name] MD      Treadmill   MPH  1.7    Grade  0    Minutes  15    METs  2.3      Recumbant Bike   Level  1    RPM  50    Watts  12    Minutes  15    METs  2      NuStep   Level  1    SPM  80    Minutes  15    METs  2      Prescription Details   Frequency (times per week)  3    Duration  Progress to 45 minutes of aerobic exercise without signs/symptoms of physical distress      Intensity   THRR 40-80% of Max Heartrate  99-139    Ratings of Perceived Exertion  11-13    Perceived Dyspnea  0-4      Progression   Progression  Continue to progress workloads to maintain intensity without signs/symptoms of physical distress.      Resistance Training   Training Prescription  Yes    Weight  3 lbs    Reps  10-15       Exercise Goals: Frequency: Be able to perform aerobic exercise two to three times per week in program working toward 2-5 days per week of home exercise.  Intensity: Work with a perceived exertion of 11 (fairly light) - 15 (hard) while following your exercise prescription.  We will make changes to your prescription with you as you progress through the program.   Duration: Be able to do 30 to 45 minutes of continuous aerobic exercise in addition to a 5 minute warm-up and a 5 minute cool-down routine.   Nutrition Goals: Your personal nutrition goals will be established when you do your nutrition  analysis with the dietician.  The following are general nutrition guidelines to follow: Cholesterol < 25m/day Sodium < 15088mday Fiber: Men over 50 yrs - 30 grams per day  Personal Goals: Personal Goals and Risk Factors at Admission - 05/15/17 1433      Core Components/Risk Factors/Patient Goals on Admission    Weight Management  Yes;Weight Loss;Obesity    Intervention  Weight Management: Develop a combined nutrition and exercise program designed to reach desired caloric intake, while maintaining appropriate intake of nutrient and fiber, sodium and fats, and appropriate energy expenditure required for the weight goal.;Weight Management: Provide education and appropriate resources to help participant work on and attain dietary goals.;Weight Management/Obesity: Establish reasonable short term and long term weight goals.;Obesity: Provide education and appropriate resources to help participant work on and attain dietary goals.    Admit Weight  314 lb 9.6 oz (142.7 kg)    Goal Weight: Short Term  310 lb (140.6 kg)    Goal Weight: Long Term  304 lb (137.9 kg)    Expected Outcomes  Short  Term: Continue to assess and modify interventions until short term weight is achieved;Long Term: Adherence to nutrition and physical activity/exercise program aimed toward attainment of established weight goal;Weight Maintenance: Understanding of the daily nutrition guidelines, which includes 25-35% calories from fat, 7% or less cal from saturated fats, less than 263m cholesterol, less than 1.5gm of sodium, & 5 or more servings of fruits and vegetables daily;Weight Loss: Understanding of general recommendations for a balanced deficit meal plan, which promotes 1-2 lb weight loss per week and includes a negative energy balance of [phone] kcal/d;Understanding recommendations for meals to include 15-35% energy as protein, 25-35% energy from fat, 35-60% energy from carbohydrates, less than 2072mof dietary cholesterol,  20-35 gm of total fiber daily;Understanding of distribution of calorie intake throughout the day with the consumption of 4-5 meals/snacks    Improve shortness of breath with ADL's  Yes    Intervention  Provide education, individualized exercise plan and daily activity instruction to help decrease symptoms of SOB with activities of daily living.    Expected Outcomes  Short Term: Improve cardiorespiratory fitness to achieve a reduction of symptoms when performing ADLs;Long Term: Be able to perform more ADLs without symptoms or delay the onset of symptoms    Diabetes  Yes    Intervention  Provide education about signs/symptoms and action to take for hypo/hyperglycemia.;Provide education about proper nutrition, including hydration, and aerobic/resistive exercise prescription along with prescribed medications to achieve blood glucose in normal ranges: Fasting glucose 65-99 mg/dL    Expected Outcomes  Short Term: Participant verbalizes understanding of the signs/symptoms and immediate care of hyper/hypoglycemia, proper foot care and importance of medication, aerobic/resistive exercise and nutrition plan for blood glucose control.;Long Term: Attainment of HbA1C < 7%.    Heart Failure  Yes    Intervention  Provide a combined exercise and nutrition program that is supplemented with education, support and counseling about heart failure. Directed toward relieving symptoms such as shortness of breath, decreased exercise tolerance, and extremity edema.    Expected Outcomes  Improve functional capacity of life;Short term: Attendance in program 2-3 days a week with increased exercise capacity. Reported lower sodium intake. Reported increased fruit and vegetable intake. Reports medication compliance.;Short term: Daily weights obtained and reported for increase. Utilizing diuretic protocols set by physician.;Long term: Adoption of self-care skills and reduction of barriers for early signs and symptoms recognition and  intervention leading to self-care maintenance.    Hypertension  Yes    Intervention  Provide education on lifestyle modifcations including regular physical activity/exercise, weight management, moderate sodium restriction and increased consumption of fresh fruit, vegetables, and low fat dairy, alcohol moderation, and smoking cessation.;Monitor prescription use compliance.    Expected Outcomes  Short Term: Continued assessment and intervention until BP is < 140/9057mG in hypertensive participants. < 130/53m69m in hypertensive participants with diabetes, heart failure or chronic kidney disease.;Long Term: Maintenance of blood pressure at goal levels.    Lipids  Yes    Intervention  Provide education and support for participant on nutrition & aerobic/resistive exercise along with prescribed medications to achieve LDL <70mg6mL >40mg.61mExpected Outcomes  Short Term: Participant states understanding of desired cholesterol values and is compliant with medications prescribed. Participant is following exercise prescription and nutrition guidelines.;Long Term: Cholesterol controlled with medications as prescribed, with individualized exercise RX and with personalized nutrition plan. Value goals: LDL < 70mg, 51m> 40 mg.       Tobacco Use Initial Evaluation: Social History  Tobacco Use  Smoking Status Never Smoker  Smokeless Tobacco Never Used    Exercise Goals and Review: Exercise Goals    Row Name 05/15/17 1530             Exercise Goals   Increase Physical Activity  Yes       Intervention  Provide advice, education, support and counseling about physical activity/exercise needs.;Develop an individualized exercise prescription for aerobic and resistive training based on initial evaluation findings, risk stratification, comorbidities and participant's personal goals.       Expected Outcomes  Short Term: Attend rehab on a regular basis to increase amount of physical activity.;Long Term: Add  in home exercise to make exercise part of routine and to increase amount of physical activity.;Long Term: Exercising regularly at least 3-5 days a week.       Increase Strength and Stamina  Yes       Intervention  Provide advice, education, support and counseling about physical activity/exercise needs.;Develop an individualized exercise prescription for aerobic and resistive training based on initial evaluation findings, risk stratification, comorbidities and participant's personal goals.       Expected Outcomes  Short Term: Increase workloads from initial exercise prescription for resistance, speed, and METs.;Short Term: Perform resistance training exercises routinely during rehab and add in resistance training at home;Long Term: Improve cardiorespiratory fitness, muscular endurance and strength as measured by increased METs and functional capacity (6MWT)       Able to understand and use rate of perceived exertion (RPE) scale  Yes       Intervention  Provide education and explanation on how to use RPE scale       Expected Outcomes  Short Term: Able to use RPE daily in rehab to express subjective intensity level;Long Term:  Able to use RPE to guide intensity level when exercising independently       Able to understand and use Dyspnea scale  Yes       Intervention  Provide education and explanation on how to use Dyspnea scale       Expected Outcomes  Short Term: Able to use Dyspnea scale daily in rehab to express subjective sense of shortness of breath during exertion;Long Term: Able to use Dyspnea scale to guide intensity level when exercising independently       Knowledge and understanding of Target Heart Rate Range (THRR)  Yes       Intervention  Provide education and explanation of THRR including how the numbers were predicted and where they are located for reference       Expected Outcomes  Short Term: Able to state/look up THRR;Long Term: Able to use THRR to govern intensity when exercising  independently;Short Term: Able to use daily as guideline for intensity in rehab       Able to check pulse independently  Yes       Intervention  Provide education and demonstration on how to check pulse in carotid and radial arteries.;Review the importance of being able to check your own pulse for safety during independent exercise       Expected Outcomes  Short Term: Able to explain why pulse checking is important during independent exercise;Long Term: Able to check pulse independently and accurately       Understanding of Exercise Prescription  Yes       Intervention  Provide education, explanation, and written materials on patient's individual exercise prescription       Expected Outcomes  Short Term: Able to explain  program exercise prescription;Long Term: Able to explain home exercise prescription to exercise independently          Copy of goals given to participant.

## 2017-05-16 DIAGNOSIS — I5033 Acute on chronic diastolic (congestive) heart failure: Secondary | ICD-10-CM | POA: Diagnosis not present

## 2017-05-16 DIAGNOSIS — R079 Chest pain, unspecified: Secondary | ICD-10-CM | POA: Diagnosis not present

## 2017-05-18 ENCOUNTER — Telehealth: Payer: Self-pay

## 2017-05-18 DIAGNOSIS — I5032 Chronic diastolic (congestive) heart failure: Secondary | ICD-10-CM

## 2017-05-18 NOTE — Telephone Encounter (Signed)
Chad [name] states that he is getting a stent placed next week. Informed him to get a doctors note to return to [name] and or start Cardiac Rehab. Patient verbalizes understanding.

## 2017-05-22 ENCOUNTER — Ambulatory Visit: Payer: Medicare Other

## 2017-05-22 DIAGNOSIS — R0602 Shortness of breath: Secondary | ICD-10-CM | POA: Diagnosis not present

## 2017-05-22 DIAGNOSIS — I2510 Atherosclerotic heart disease of native coronary artery without angina pectoris: Secondary | ICD-10-CM | POA: Diagnosis not present

## 2017-05-22 DIAGNOSIS — I5032 Chronic diastolic (congestive) heart failure: Secondary | ICD-10-CM | POA: Diagnosis not present

## 2017-05-22 DIAGNOSIS — I440 Atrioventricular block, first degree: Secondary | ICD-10-CM | POA: Diagnosis not present

## 2017-05-22 DIAGNOSIS — Z9884 Bariatric surgery status: Secondary | ICD-10-CM | POA: Diagnosis not present

## 2017-05-22 DIAGNOSIS — E039 Hypothyroidism, unspecified: Secondary | ICD-10-CM | POA: Diagnosis not present

## 2017-05-22 DIAGNOSIS — M25471 Effusion, right ankle: Secondary | ICD-10-CM | POA: Diagnosis not present

## 2017-05-22 DIAGNOSIS — F419 Anxiety disorder, unspecified: Secondary | ICD-10-CM | POA: Diagnosis not present

## 2017-05-22 DIAGNOSIS — R0789 Other chest pain: Secondary | ICD-10-CM | POA: Diagnosis not present

## 2017-05-22 DIAGNOSIS — R079 Chest pain, unspecified: Secondary | ICD-10-CM | POA: Diagnosis not present

## 2017-05-22 DIAGNOSIS — Z9049 Acquired absence of other specified parts of digestive tract: Secondary | ICD-10-CM | POA: Diagnosis not present

## 2017-05-22 DIAGNOSIS — M25472 Effusion, left ankle: Secondary | ICD-10-CM | POA: Diagnosis not present

## 2017-05-22 DIAGNOSIS — E119 Type 2 diabetes mellitus without complications: Secondary | ICD-10-CM | POA: Diagnosis not present

## 2017-05-22 DIAGNOSIS — I110 Hypertensive heart disease with heart failure: Secondary | ICD-10-CM | POA: Diagnosis not present

## 2017-05-22 DIAGNOSIS — K30 Functional dyspepsia: Secondary | ICD-10-CM | POA: Diagnosis not present

## 2017-05-22 DIAGNOSIS — E785 Hyperlipidemia, unspecified: Secondary | ICD-10-CM | POA: Diagnosis not present

## 2017-05-24 ENCOUNTER — Ambulatory Visit: Payer: Medicare Other

## 2017-05-24 DIAGNOSIS — I5033 Acute on chronic diastolic (congestive) heart failure: Secondary | ICD-10-CM | POA: Diagnosis not present

## 2017-05-26 ENCOUNTER — Ambulatory Visit: Payer: Medicare Other

## 2017-05-26 DIAGNOSIS — H2511 Age-related nuclear cataract, right eye: Secondary | ICD-10-CM | POA: Diagnosis not present

## 2017-05-26 DIAGNOSIS — H3562 Retinal hemorrhage, left eye: Secondary | ICD-10-CM | POA: Diagnosis not present

## 2017-05-26 DIAGNOSIS — H43813 Vitreous degeneration, bilateral: Secondary | ICD-10-CM | POA: Diagnosis not present

## 2017-05-26 DIAGNOSIS — H34812 Central retinal vein occlusion, left eye, with macular edema: Secondary | ICD-10-CM | POA: Diagnosis not present

## 2017-05-29 ENCOUNTER — Telehealth: Payer: Self-pay

## 2017-05-29 ENCOUNTER — Ambulatory Visit: Payer: Medicare Other

## 2017-05-29 DIAGNOSIS — I5032 Chronic diastolic (congestive) heart failure: Secondary | ICD-10-CM

## 2017-05-29 NOTE — Telephone Encounter (Signed)
Called Chad [name] to see when he could start [facility]. He states he has fluid on his heart and has to ask his doctor if it is ok to return. Patient states he will call when he gets this information.

## 2017-05-29 NOTE — Progress Notes (Signed)
Pulmonary Individual Treatment Plan  Patient Details  Name: Chad [name] MRN: [id number] Date of Birth: November 26, 1955 Referring Provider:     Pulmonary Rehab from 05/15/2017 in [facility] Cardiac and Pulmonary Rehab  Referring Provider  [name] MD      Initial Encounter Date:    Pulmonary Rehab from 05/15/2017 in [facility] Cardiac and Pulmonary Rehab  Date  05/15/17  Referring Provider  [name] MD      Visit Diagnosis: Heart failure, diastolic, chronic ([place])  Patient's Home Medications on Admission:  Current Outpatient Medications:  .  amLODipine (NORVASC) 5 MG tablet, Take 1 tablet (5 mg total) by mouth daily., Disp: , Rfl:  .  aspirin 81 MG chewable tablet, Chew 81 mg by mouth daily. , Disp: , Rfl:  .  atorvastatin (LIPITOR) 20 MG tablet, Take 1 tablet (20 mg total) by mouth daily., Disp: , Rfl:  .  bumetanide (BUMEX) 2 MG tablet, Take 0.5 tablets (1 mg total) by mouth 2 (two) times daily as needed (weight gian/leg swelling)., Disp: , Rfl:  .  citalopram (CELEXA) 20 MG tablet, TAKE 1 TABLET BY MOUTH ONCE DAILY, Disp: 30 tablet, Rfl: 1 .  Docusate Sodium (DSS) 100 MG CAPS, 1 tab 2 times a day while on narcotics.  STOOL SOFTENER, Disp: 60 each, Rfl: 0 .  fluticasone (FLONASE) 50 MCG/ACT nasal spray, PLACE 2 SPRAYS INTO BOTH NOSTRILS DAILY, Disp: 16 g, Rfl: 2 .  glimepiride (AMARYL) 2 MG tablet, Take 1 tablet (2 mg total) by mouth daily as needed (hyperglycemia)., Disp: , Rfl:  .  Insulin Glargine (BASAGLAR KWIKPEN) 100 UNIT/ML SOPN, Inject 0.5 mLs (50 Units total) into the skin at bedtime., Disp: , Rfl:  .  Insulin Pen Needle (ULTICARE MINI PEN NEEDLES) 31G X 6 MM MISC, USE AS DIRECTED FOR LANTUS SOLOSTAR PEN, Disp: 100 each, Rfl: 3 .  isosorbide mononitrate (IMDUR) 120 MG 24 hr tablet, Take 1 tablet (120 mg total) by mouth daily., Disp: , Rfl:  .  levothyroxine (SYNTHROID, LEVOTHROID) 112 MCG tablet, Take 1 tablet (112 mcg total) by mouth daily., Disp: 90 tablet, Rfl: 3 .   lisinopril (PRINIVIL,ZESTRIL) 20 MG tablet, Take 1 tablet (20 mg total) by mouth daily., Disp: 90 tablet, Rfl: 3 .  metFORMIN (GLUCOPHAGE) 1000 MG tablet, TAKE 1 TABLET BY MOUTH TWICE A DAY WITH A MEAL, Disp: 180 tablet, Rfl: 0 .  metoCLOPramide (REGLAN) 10 MG tablet, TAKE 1 TABLET BY MOUTH 3 TIMES DAILY BEFORE MEALS, Disp: 90 tablet, Rfl: 6 .  metolazone (ZAROXOLYN) 2.5 MG tablet, Take 1 tablet (2.5 mg total) by mouth daily as needed (for more than 3lb/day or 5lb/wk weight gain)., Disp: , Rfl:  .  metoprolol succinate (TOPROL-XL) 50 MG 24 hr tablet, Take 50 mg daily by mouth. Take with or immediately following a meal., Disp: , Rfl:  .  Multiple Vitamin (MULITIVITAMIN WITH MINERALS) TABS, Take 1 tablet by mouth daily., Disp: , Rfl:  .  NEXIUM 40 MG capsule, Take 1 capsule (40 mg total) by mouth 2 (two) times daily., Disp: 60 capsule, Rfl: 11 .  nitroGLYCERIN (NITROSTAT) 0.4 MG SL tablet, Place 1 tablet (0.4 mg total) under the tongue every 5 (five) minutes as needed for chest pain., Disp: 60 tablet, Rfl: 3 .  polyethylene glycol (MIRALAX / GLYCOLAX) packet, 17grams in 16 oz of water twice a day until bowel movement.  LAXITIVE.  Restart if two days since last bowel movement, Disp: 14 each, Rfl: 0 .  potassium chloride (  K-DUR,KLOR-CON) 10 MEQ tablet, Take 10 mEq by mouth 2 (two) times daily., Disp: , Rfl:  .  ULTICARE MINI PEN NEEDLES 31G X 6 MM MISC, , Disp: , Rfl:  .  VENTOLIN HFA 108 (90 Base) MCG/ACT inhaler, INHALE 2 PUFFS INTO THE LUNGS EVERY 6 HOURS AS NEEDED FOR WHEEZING ORSHORTNESS OF BREATH, Disp: 18 g, Rfl: 6 .  vitamin E 400 UNIT capsule, Take 400 Units by mouth daily., Disp: , Rfl:   Past Medical History: Past Medical History:  Diagnosis Date  . Abnormal drug screen    innaprop negative for hydrocodone 09/2013, inapprop negative for hydrocodone and tramadol 02/2014; inappropr negative hydrocodone 03/2015  . Acute diverticulitis 08/15/2014  . Arthritis    "both hips and knees; got shots in  each hip in August" (01/25/2013)  . Bone spur    L4 L5  . Bulging lumbar disc   . Coronary artery disease   . Diabetes mellitus without complication ([place])    no medicarions in over 2 years,wt. loss 100 lbs  . Diastolic CHF, chronic ([place]) 04/02/2012  . Diverticulosis   . Gastric bypass status for obesity 1985  . Gastritis 08/31/2015   with focal intestinal metaplasia  . GERD (gastroesophageal reflux disease)    severe, h/o gastritis and GI bleed, per pt normal EGD [facility] 2008  . History of diabetes mellitus 1990s   with mild background retinopathy, resolved with weight loss  . HLD (hyperlipidemia)    statin caused leg cramps  . HTN (hypertension)   . Hyperglycemia glucose over 300 in last 24 hrs 07/12/2016  . Hyperplastic colon polyp 2008  . Hypothyroid   . Internal hemorrhoids   . Morbid obesity ([place])   . Narrowing of lumbar spine   . OSA (obstructive sleep apnea)    unable to use CPAP as of last try 2/2 h/o tracheostomy?  . Otomycosis of right ear 07/06/2011  . Primary localized osteoarthritis of left knee 06/29/2016  . PVC (premature ventricular contraction)    RBBB Infer axis  . Right ear pain    s/p eval by ENT - thought TMJ referred pain and sent to oral surg for dental splint  . Seasonal allergies   . Sensorineural hearing loss, bilateral    hearing aides  . Splenomegaly   . Thrombocytopenia ([place]) 06/10/2015  . Thrombocytopenia ([place]) 06/10/2015   Platelet count dropped to 73 post op day 2 after total knee   . Tinnitus    due to sensorineural hearing loss R>L with ETD  . Trifascicular block  RBBB/LPFB/1AVB     Tobacco Use: Social History   Tobacco Use  Smoking Status Never Smoker  Smokeless Tobacco Never Used    Labs: Recent Review Flowsheet Data    Labs for ITP Cardiac and Pulmonary Rehab Latest Ref Rng & Units 06/08/2016 07/11/2016 11/08/2016 12/27/2016 04/03/2017   Cholestrol 0 - 200 mg/dL - - 171 189 -   LDLCALC 0 - 99 mg/dL - - 89 123(H) -   LDLDIRECT mg/dL - -  - - -   HDL >40 mg/dL - - 50.50 47 -   Trlycerides <150 mg/dL - - 158.0(H) 97 -   Hemoglobin A1c - 7.4(H) 6.9(H) 7.3(H) 8.5(H) 7.6   TCO2 0 - 100 mmol/L - - - - -       Pulmonary Assessment Scores: Pulmonary Assessment Scores    Row Name 05/15/17 1531         ADL UCSD   ADL Phase  Entry  SOB Score total  81     Rest  1     Walk  4     Stairs  5     Bath  3     Dress  4     Shop  4       CAT Score   CAT Score  27       mMRC Score   mMRC Score  2        Pulmonary Function Assessment: Pulmonary Function Assessment - 05/15/17 1431      Breath   Bilateral Breath Sounds  Clear    Shortness of Breath  Yes;Fear of Shortness of Breath;Limiting activity       Exercise Target Goals:    Exercise Program Goal: Individual exercise prescription set using results from initial 6 min walk test and THRR while considering  patient's activity barriers and safety.    Exercise Prescription Goal: Initial exercise prescription builds to 30-45 minutes a day of aerobic activity, 2-3 days per week.  Home exercise guidelines will be given to patient during program as part of exercise prescription that the participant will acknowledge.  Activity Barriers & Risk Stratification: Activity Barriers & Cardiac Risk Stratification - 05/15/17 1525      Activity Barriers & Cardiac Risk Stratification   Activity Barriers  Deconditioning;Muscular Weakness;Shortness of Breath;Left Knee Replacement;Right Knee Replacement;Joint Problems;Chest Pain/Angina left shoulder rotator cuff repair       6 Minute Walk: 6 Minute Walk    Row Name 05/15/17 1522         6 Minute Walk   Phase  Initial     Distance  1025 feet     Walk Time  6 minutes     # of Rest Breaks  0     MPH  1.94     METS  1.73     RPE  13     Perceived Dyspnea   4     VO2 Peak  6.05     Symptoms  Yes (comment)     Comments  chest pain 6/10, SOB **took NTG 53mn prior to test**     Resting HR  61 bpm     Resting BP   116/58     Resting Oxygen Saturation   98 %     Exercise Oxygen Saturation  during 6 min walk  95 %     Max Ex. HR  77 bpm     Max Ex. BP  154/74       Interval HR   1 Minute HR  72     2 Minute HR  - error, machine not reading HR     6 Minute HR  77     2 Minute Post HR  67     Interval Heart Rate?  Yes       Interval Oxygen   Interval Oxygen?  Yes     Baseline Oxygen Saturation %  98 %     1 Minute Oxygen Saturation %  95 %     1 Minute Liters of Oxygen  0 L Room Air     2 Minute Oxygen Saturation %  95 %     2 Minute Liters of Oxygen  0 L     3 Minute Oxygen Saturation %  96 %     3 Minute Liters of Oxygen  0 L     4 Minute Oxygen Saturation %  97 %  4 Minute Liters of Oxygen  0 L     5 Minute Oxygen Saturation %  97 %     5 Minute Liters of Oxygen  0 L     6 Minute Oxygen Saturation %  98 %     6 Minute Liters of Oxygen  0 L     2 Minute Post Oxygen Saturation %  98 %     2 Minute Post Liters of Oxygen  0 L       Oxygen Initial Assessment: Oxygen Initial Assessment - 05/15/17 1432      Home Oxygen   Home Oxygen Device  None    Sleep Oxygen Prescription  None    Home Exercise Oxygen Prescription  None    Home at Rest Exercise Oxygen Prescription  None      Initial 6 min Walk   Oxygen Used  None      Program Oxygen Prescription   Program Oxygen Prescription  None      Intervention   Short Term Goals  To learn and understand importance of maintaining oxygen saturations>88%;To learn and demonstrate proper use of respiratory medications;To learn and demonstrate proper pursed lip breathing techniques or other breathing techniques.;To learn and understand importance of monitoring SPO2 with pulse oximeter and demonstrate accurate use of the pulse oximeter.    Long  Term Goals  Demonstrates proper use of MDI's;Compliance with respiratory medication;Exhibits proper breathing techniques, such as pursed lip breathing or other method taught during program  session;Maintenance of O2 saturations>88%;Verbalizes importance of monitoring SPO2 with pulse oximeter and return demonstration       Oxygen Re-Evaluation:   Oxygen Discharge (Final Oxygen Re-Evaluation):   Initial Exercise Prescription: Initial Exercise Prescription - 05/15/17 1500      Date of Initial Exercise RX and Referring Provider   Date  05/15/17    Referring Provider  [name] MD      Treadmill   MPH  1.7    Grade  0    Minutes  15    METs  2.3      Recumbant Bike   Level  1    RPM  50    Watts  12    Minutes  15    METs  2      NuStep   Level  1    SPM  80    Minutes  15    METs  2      Prescription Details   Frequency (times per week)  3    Duration  Progress to 45 minutes of aerobic exercise without signs/symptoms of physical distress      Intensity   THRR 40-80% of Max Heartrate  99-139    Ratings of Perceived Exertion  11-13    Perceived Dyspnea  0-4      Progression   Progression  Continue to progress workloads to maintain intensity without signs/symptoms of physical distress.      Resistance Training   Training Prescription  Yes    Weight  3 lbs    Reps  10-15       Perform Capillary Blood Glucose checks as needed.  Exercise Prescription Changes: Exercise Prescription Changes    Row Name 05/15/17 1500             Response to Exercise   Blood Pressure (Admit)  116/58       Blood Pressure (Exercise)  154/74       Blood Pressure (Exit)  124/74       Heart Rate (Admit)  61 bpm       Heart Rate (Exercise)  77 bpm       Heart Rate (Exit)  62 bpm       Oxygen Saturation (Admit)  98 %       Oxygen Saturation (Exercise)  95 %       Oxygen Saturation (Exit)  98 %       Rating of Perceived Exertion (Exercise)  13       Perceived Dyspnea (Exercise)  4       Symptoms  chest pain 6/10, SOB       Comments  walk test results          Exercise Comments:   Exercise Goals and Review: Exercise Goals    Row Name 05/15/17 1530               Exercise Goals   Increase Physical Activity  Yes       Intervention  Provide advice, education, support and counseling about physical activity/exercise needs.;Develop an individualized exercise prescription for aerobic and resistive training based on initial evaluation findings, risk stratification, comorbidities and participant's personal goals.       Expected Outcomes  Short Term: Attend rehab on a regular basis to increase amount of physical activity.;Long Term: Add in home exercise to make exercise part of routine and to increase amount of physical activity.;Long Term: Exercising regularly at least 3-5 days a week.       Increase Strength and Stamina  Yes       Intervention  Provide advice, education, support and counseling about physical activity/exercise needs.;Develop an individualized exercise prescription for aerobic and resistive training based on initial evaluation findings, risk stratification, comorbidities and participant's personal goals.       Expected Outcomes  Short Term: Increase workloads from initial exercise prescription for resistance, speed, and METs.;Short Term: Perform resistance training exercises routinely during rehab and add in resistance training at home;Long Term: Improve cardiorespiratory fitness, muscular endurance and strength as measured by increased METs and functional capacity (6MWT)       Able to understand and use rate of perceived exertion (RPE) scale  Yes       Intervention  Provide education and explanation on how to use RPE scale       Expected Outcomes  Short Term: Able to use RPE daily in rehab to express subjective intensity level;Long Term:  Able to use RPE to guide intensity level when exercising independently       Able to understand and use Dyspnea scale  Yes       Intervention  Provide education and explanation on how to use Dyspnea scale       Expected Outcomes  Short Term: Able to use Dyspnea scale daily in rehab to express  subjective sense of shortness of breath during exertion;Long Term: Able to use Dyspnea scale to guide intensity level when exercising independently       Knowledge and understanding of Target Heart Rate Range (THRR)  Yes       Intervention  Provide education and explanation of THRR including how the numbers were predicted and where they are located for reference       Expected Outcomes  Short Term: Able to state/look up THRR;Long Term: Able to use THRR to govern intensity when exercising independently;Short Term: Able to use daily as guideline for intensity in rehab  Able to check pulse independently  Yes       Intervention  Provide education and demonstration on how to check pulse in carotid and radial arteries.;Review the importance of being able to check your own pulse for safety during independent exercise       Expected Outcomes  Short Term: Able to explain why pulse checking is important during independent exercise;Long Term: Able to check pulse independently and accurately       Understanding of Exercise Prescription  Yes       Intervention  Provide education, explanation, and written materials on patient's individual exercise prescription       Expected Outcomes  Short Term: Able to explain program exercise prescription;Long Term: Able to explain home exercise prescription to exercise independently          Exercise Goals Re-Evaluation :   Discharge Exercise Prescription (Final Exercise Prescription Changes): Exercise Prescription Changes - 05/15/17 1500      Response to Exercise   Blood Pressure (Admit)  116/58    Blood Pressure (Exercise)  154/74    Blood Pressure (Exit)  124/74    Heart Rate (Admit)  61 bpm    Heart Rate (Exercise)  77 bpm    Heart Rate (Exit)  62 bpm    Oxygen Saturation (Admit)  98 %    Oxygen Saturation (Exercise)  95 %    Oxygen Saturation (Exit)  98 %    Rating of Perceived Exertion (Exercise)  13    Perceived Dyspnea (Exercise)  4    Symptoms   chest pain 6/10, SOB    Comments  walk test results       Nutrition:  Target Goals: Understanding of nutrition guidelines, daily intake of sodium <15101m, cholesterol <2055m calories 30% from fat and 7% or less from saturated fats, daily to have 5 or more servings of fruits and vegetables.  Biometrics: Pre Biometrics - 05/15/17 1531      Pre Biometrics   Height  5' 10.5" (1.791 m)    Weight  314 lb 9.6 oz (142.7 kg)  (Abnormal)     Waist Circumference  47 inches    Hip Circumference  54 inches    Waist to Hip Ratio  0.87 %    BMI (Calculated)  44.49        Nutrition Therapy Plan and Nutrition Goals: Nutrition Therapy & Goals - 05/15/17 1430      Personal Nutrition Goals   Comments  He wants to lose some weight. He is following a heart healthy diet at the moment.  Nutrition Appt has been scheduled for 4/9 at 1085m      Intervention Plan   Intervention  Prescribe, educate and counsel regarding individualized specific dietary modifications aiming towards targeted core components such as weight, hypertension, lipid management, diabetes, heart failure and other comorbidities.;Nutrition handout(s) given to patient.    Expected Outcomes  Short Term Goal: Understand basic principles of dietary content, such as calories, fat, sodium, cholesterol and nutrients.;Long Term Goal: Adherence to prescribed nutrition plan.       Nutrition Assessments: Nutrition Assessments - 05/15/17 1557      MEDFICTS Scores   Pre Score  35       Nutrition Goals Re-Evaluation:   Nutrition Goals Discharge (Final Nutrition Goals Re-Evaluation):   Psychosocial: Target Goals: Acknowledge presence or absence of significant depression and/or stress, maximize coping skills, provide positive support system. Participant is able to verbalize types and ability to use techniques and  skills needed for reducing stress and depression.   Initial Review & Psychosocial Screening: Initial Psych Review & Screening  - 05/15/17 1427      Initial Review   Current issues with  None Identified      Family Dynamics   Good Support System?  Yes    Comments  His wife, brother and 2 daughters are good for his support.      Barriers   Psychosocial barriers to participate in program  The patient should benefit from training in stress management and relaxation.      Screening Interventions   Interventions  Encouraged to exercise;Provide feedback about the scores to participant;Program counselor consult;To provide support and resources with identified psychosocial needs    Expected Outcomes  Short Term goal: Utilizing psychosocial counselor, staff and physician to assist with identification of specific Stressors or current issues interfering with healing process. Setting desired goal for each stressor or current issue identified.;Long Term Goal: Stressors or current issues are controlled or eliminated.;Short Term goal: Identification and review with participant of any Quality of Life or Depression concerns found by scoring the questionnaire.;Long Term goal: The participant improves quality of Life and PHQ9 Scores as seen by post scores and/or verbalization of changes       Quality of Life Scores:  Scores of 19 and below usually indicate a poorer quality of life in these areas.  A difference of  2-3 points is a clinically meaningful difference.  A difference of 2-3 points in the total score of the Quality of Life Index has been associated with significant improvement in overall quality of life, self-image, physical symptoms, and general health in studies assessing change in quality of life.  PHQ-9: Recent Review Flowsheet Data    Depression screen [facility] 2/9 05/15/2017 12/19/2016 11/08/2016 09/07/2016 04/28/2015   Decreased Interest 0 0 0 0 0   Down, Depressed, Hopeless 0 0 0 0 0   PHQ - 2 Score 0 0 0 0 0   Altered sleeping 0 - 0 - -   Tired, decreased energy 3 - 0 - -   Change in appetite 0 - 0 - -   Feeling bad or  failure about yourself  0 - 0 - -   Trouble concentrating 0 - 0 - -   Moving slowly or fidgety/restless 0 - 0 - -   Suicidal thoughts 0 - 0 - -   PHQ-9 Score 3 - 0 - -   Difficult doing work/chores Very difficult - Not difficult at all - -     Interpretation of Total Score  Total Score Depression Severity:  1-4 = Minimal depression, 5-9 = Mild depression, 10-14 = Moderate depression, 15-19 = Moderately severe depression, 20-27 = Severe depression   Psychosocial Evaluation and Intervention:   Psychosocial Re-Evaluation:   Psychosocial Discharge (Final Psychosocial Re-Evaluation):   Education: Education Goals: Education classes will be provided on a weekly basis, covering required topics. Participant will state understanding/return demonstration of topics presented.  Learning Barriers/Preferences: Learning Barriers/Preferences - 05/15/17 1432      Learning Barriers/Preferences   Learning Barriers  None    Learning Preferences  None       Education Topics:  Initial Evaluation Education: - Verbal, written and demonstration of respiratory meds, oximetry and breathing techniques. Instruction on use of nebulizers and MDIs and importance of monitoring MDI activations.   Pulmonary Rehab from 05/15/2017 in [facility] Cardiac and Pulmonary Rehab  Date  05/15/17  Educator  [facility]  Instruction  Review Code  1- Verbalizes Understanding      General Nutrition Guidelines/Fats and Fiber: -Group instruction provided by verbal, written material, models and posters to present the general guidelines for heart healthy nutrition. Gives an explanation and review of dietary fats and fiber.   Controlling Sodium/Reading Food Labels: -Group verbal and written material supporting the discussion of sodium use in heart healthy nutrition. Review and explanation with models, verbal and written materials for utilization of the food label.   Exercise Physiology & General Exercise Guidelines: - Group verbal and  written instruction with models to review the exercise physiology of the cardiovascular system and associated critical values. Provides general exercise guidelines with specific guidelines to those with heart or lung disease.    Aerobic Exercise & Resistance Training: - Gives group verbal and written instruction on the various components of exercise. Focuses on aerobic and resistive training programs and the benefits of this training and how to safely progress through these programs.   Flexibility, Balance, Mind/Body Relaxation: Provides group verbal/written instruction on the benefits of flexibility and balance training, including mind/body exercise modes such as yoga, pilates and tai chi.  Demonstration and skill practice provided.   Stress and Anxiety: - Provides group verbal and written instruction about the health risks of elevated stress and causes of high stress.  Discuss the correlation between heart/lung disease and anxiety and treatment options. Review healthy ways to manage with stress and anxiety.   Depression: - Provides group verbal and written instruction on the correlation between heart/lung disease and depressed mood, treatment options, and the stigmas associated with seeking treatment.   Exercise & Equipment Safety: - Individual verbal instruction and demonstration of equipment use and safety with use of the equipment.   Pulmonary Rehab from 05/15/2017 in [facility] Cardiac and Pulmonary Rehab  Date  05/15/17  Educator  [facility]  Instruction Review Code  1- Verbalizes Understanding      Infection Prevention: - Provides verbal and written material to individual with discussion of infection control including proper hand washing and proper equipment cleaning during exercise session.   Pulmonary Rehab from 05/15/2017 in [facility] Cardiac and Pulmonary Rehab  Date  05/15/17  Educator  [facility]  Instruction Review Code  1- Verbalizes Understanding      Falls Prevention: - Provides verbal and  written material to individual with discussion of falls prevention and safety.   Pulmonary Rehab from 05/15/2017 in [facility] Cardiac and Pulmonary Rehab  Date  05/15/17  Educator  [facility]  Instruction Review Code  1- Verbalizes Understanding      Diabetes: - Individual verbal and written instruction to review signs/symptoms of diabetes, desired ranges of glucose level fasting, after meals and with exercise. Advice that pre and post exercise glucose checks will be done for 3 sessions at entry of program.   Chronic Lung Diseases: - Group verbal and written instruction to review updates, respiratory medications, advancements in procedures and treatments. Discuss use of supplemental oxygen including available portable oxygen systems, continuous and intermittent flow rates, concentrators, personal use and safety guidelines. Review proper use of inhaler and spacers. Provide informative websites for self-education.    Energy Conservation: - Provide group verbal and written instruction for methods to conserve energy, plan and organize activities. Instruct on pacing techniques, use of adaptive equipment and posture/positioning to relieve shortness of breath.   Triggers and Exacerbations: - Group verbal and written instruction to review types of environmental triggers and ways to prevent exacerbations. Discuss weather changes, air quality and the  benefits of nasal washing. Review warning signs and symptoms to help prevent infections. Discuss techniques for effective airway clearance, coughing, and vibrations.   AED/CPR: - Group verbal and written instruction with the use of models to demonstrate the basic use of the AED with the basic ABC's of resuscitation.   Anatomy and Physiology of the Lungs: - Group verbal and written instruction with the use of models to provide basic lung anatomy and physiology related to function, structure and complications of lung disease.   Anatomy & Physiology of the  Heart: - Group verbal and written instruction and models provide basic cardiac anatomy and physiology, with the coronary electrical and arterial systems. Review of Valvular disease and Heart Failure   Cardiac Medications: - Group verbal and written instruction to review commonly prescribed medications for heart disease. Reviews the medication, class of the drug, and side effects.   Know Your Numbers and Risk Factors: -Group verbal and written instruction about important numbers in your health.  Discussion of what are risk factors and how they play a role in the disease process.  Review of Cholesterol, Blood Pressure, Diabetes, and BMI and the role they play in your overall health.   Sleep Hygiene: -Provides group verbal and written instruction about how sleep can affect your health.  Define sleep hygiene, discuss sleep cycles and impact of sleep habits. Review good sleep hygiene tips.    Other: -Provides group and verbal instruction on various topics (see comments)    Knowledge Questionnaire Score: Knowledge Questionnaire Score - 05/15/17 1431      Knowledge Questionnaire Score   Pre Score  13/18 reviewed with patient        Core Components/Risk Factors/Patient Goals at Admission: Personal Goals and Risk Factors at Admission - 05/15/17 1433      Core Components/Risk Factors/Patient Goals on Admission    Weight Management  Yes;Weight Loss;Obesity    Intervention  Weight Management: Develop a combined nutrition and exercise program designed to reach desired caloric intake, while maintaining appropriate intake of nutrient and fiber, sodium and fats, and appropriate energy expenditure required for the weight goal.;Weight Management: Provide education and appropriate resources to help participant work on and attain dietary goals.;Weight Management/Obesity: Establish reasonable short term and long term weight goals.;Obesity: Provide education and appropriate resources to help participant  work on and attain dietary goals.    Admit Weight  314 lb 9.6 oz (142.7 kg)    Goal Weight: Short Term  310 lb (140.6 kg)    Goal Weight: Long Term  304 lb (137.9 kg)    Expected Outcomes  Short Term: Continue to assess and modify interventions until short term weight is achieved;Long Term: Adherence to nutrition and physical activity/exercise program aimed toward attainment of established weight goal;Weight Maintenance: Understanding of the daily nutrition guidelines, which includes 25-35% calories from fat, 7% or less cal from saturated fats, less than 265m cholesterol, less than 1.5gm of sodium, & 5 or more servings of fruits and vegetables daily;Weight Loss: Understanding of general recommendations for a balanced deficit meal plan, which promotes 1-2 lb weight loss per week and includes a negative energy balance of [phone] kcal/d;Understanding recommendations for meals to include 15-35% energy as protein, 25-35% energy from fat, 35-60% energy from carbohydrates, less than 2050mof dietary cholesterol, 20-35 gm of total fiber daily;Understanding of distribution of calorie intake throughout the day with the consumption of 4-5 meals/snacks    Improve shortness of breath with ADL's  Yes    Intervention  Provide education, individualized exercise plan and daily activity instruction to help decrease symptoms of SOB with activities of daily living.    Expected Outcomes  Short Term: Improve cardiorespiratory fitness to achieve a reduction of symptoms when performing ADLs;Long Term: Be able to perform more ADLs without symptoms or delay the onset of symptoms    Diabetes  Yes    Intervention  Provide education about signs/symptoms and action to take for hypo/hyperglycemia.;Provide education about proper nutrition, including hydration, and aerobic/resistive exercise prescription along with prescribed medications to achieve blood glucose in normal ranges: Fasting glucose 65-99 mg/dL    Expected Outcomes  Short  Term: Participant verbalizes understanding of the signs/symptoms and immediate care of hyper/hypoglycemia, proper foot care and importance of medication, aerobic/resistive exercise and nutrition plan for blood glucose control.;Long Term: Attainment of HbA1C < 7%.    Heart Failure  Yes    Intervention  Provide a combined exercise and nutrition program that is supplemented with education, support and counseling about heart failure. Directed toward relieving symptoms such as shortness of breath, decreased exercise tolerance, and extremity edema.    Expected Outcomes  Improve functional capacity of life;Short term: Attendance in program 2-3 days a week with increased exercise capacity. Reported lower sodium intake. Reported increased fruit and vegetable intake. Reports medication compliance.;Short term: Daily weights obtained and reported for increase. Utilizing diuretic protocols set by physician.;Long term: Adoption of self-care skills and reduction of barriers for early signs and symptoms recognition and intervention leading to self-care maintenance.    Hypertension  Yes    Intervention  Provide education on lifestyle modifcations including regular physical activity/exercise, weight management, moderate sodium restriction and increased consumption of fresh fruit, vegetables, and low fat dairy, alcohol moderation, and smoking cessation.;Monitor prescription use compliance.    Expected Outcomes  Short Term: Continued assessment and intervention until BP is < 140/41m HG in hypertensive participants. < 130/874mHG in hypertensive participants with diabetes, heart failure or chronic kidney disease.;Long Term: Maintenance of blood pressure at goal levels.    Lipids  Yes    Intervention  Provide education and support for participant on nutrition & aerobic/resistive exercise along with prescribed medications to achieve LDL <7085mHDL >56m4m  Expected Outcomes  Short Term: Participant states understanding of  desired cholesterol values and is compliant with medications prescribed. Participant is following exercise prescription and nutrition guidelines.;Long Term: Cholesterol controlled with medications as prescribed, with individualized exercise RX and with personalized nutrition plan. Value goals: LDL < 70mg5mL > 40 mg.       Core Components/Risk Factors/Patient Goals Review:    Core Components/Risk Factors/Patient Goals at Discharge (Final Review):    ITP Comments: ITP Comments    Row Name 05/15/17 1404 05/18/17 1230 05/29/17 0832       ITP Comments  Medical Evaluation completed. Chart sent for review and changes to Dr. Mark Emily Filbertctor [facility]  [place] that he is getting a stent placed next week. In[place] [facility] [place] [facility] [place] [facility] [place] [facility]. Patient verbalizes understanding.   30 day review completed. ITP sent to Dr. Mark Emily Filbertctor of [facility] with [facility]P unless changes are made by physician        Comments: 30 day review

## 2017-05-31 ENCOUNTER — Ambulatory Visit: Payer: Medicare Other

## 2017-06-02 ENCOUNTER — Ambulatory Visit: Payer: Medicare Other

## 2017-06-05 ENCOUNTER — Ambulatory Visit: Payer: Medicare Other

## 2017-06-07 ENCOUNTER — Ambulatory Visit: Payer: Medicare Other

## 2017-06-09 ENCOUNTER — Ambulatory Visit: Payer: Medicare Other

## 2017-06-12 ENCOUNTER — Ambulatory Visit: Payer: Medicare Other

## 2017-06-14 ENCOUNTER — Ambulatory Visit: Payer: Medicare Other

## 2017-06-16 ENCOUNTER — Ambulatory Visit: Payer: Medicare Other

## 2017-06-16 LAB — HIV ANTIBODY (ROUTINE TESTING W REFLEX): HIV SCREEN 4TH GENERATION: NONREACTIVE

## 2017-06-19 ENCOUNTER — Ambulatory Visit: Payer: Medicare Other

## 2017-06-20 ENCOUNTER — Telehealth: Payer: Self-pay

## 2017-06-20 DIAGNOSIS — I5033 Acute on chronic diastolic (congestive) heart failure: Secondary | ICD-10-CM | POA: Diagnosis not present

## 2017-06-20 DIAGNOSIS — I5032 Chronic diastolic (congestive) heart failure: Secondary | ICD-10-CM

## 2017-06-20 NOTE — Telephone Encounter (Signed)
Left a message for Chad [name] to call back and if he was able to resume [facility].

## 2017-06-21 ENCOUNTER — Ambulatory Visit: Payer: Medicare Other

## 2017-06-21 DIAGNOSIS — Z9049 Acquired absence of other specified parts of digestive tract: Secondary | ICD-10-CM | POA: Diagnosis not present

## 2017-06-21 DIAGNOSIS — E669 Obesity, unspecified: Secondary | ICD-10-CM | POA: Diagnosis not present

## 2017-06-21 DIAGNOSIS — E119 Type 2 diabetes mellitus without complications: Secondary | ICD-10-CM | POA: Diagnosis not present

## 2017-06-21 DIAGNOSIS — I2510 Atherosclerotic heart disease of native coronary artery without angina pectoris: Secondary | ICD-10-CM | POA: Diagnosis not present

## 2017-06-21 DIAGNOSIS — E039 Hypothyroidism, unspecified: Secondary | ICD-10-CM | POA: Diagnosis not present

## 2017-06-21 DIAGNOSIS — I110 Hypertensive heart disease with heart failure: Secondary | ICD-10-CM | POA: Diagnosis not present

## 2017-06-21 DIAGNOSIS — R0789 Other chest pain: Secondary | ICD-10-CM | POA: Diagnosis not present

## 2017-06-21 DIAGNOSIS — Z794 Long term (current) use of insulin: Secondary | ICD-10-CM | POA: Diagnosis not present

## 2017-06-21 DIAGNOSIS — K219 Gastro-esophageal reflux disease without esophagitis: Secondary | ICD-10-CM | POA: Diagnosis not present

## 2017-06-21 DIAGNOSIS — R5383 Other fatigue: Secondary | ICD-10-CM | POA: Diagnosis not present

## 2017-06-21 DIAGNOSIS — I200 Unstable angina: Secondary | ICD-10-CM | POA: Diagnosis not present

## 2017-06-21 DIAGNOSIS — Z9884 Bariatric surgery status: Secondary | ICD-10-CM | POA: Diagnosis not present

## 2017-06-21 DIAGNOSIS — R0602 Shortness of breath: Secondary | ICD-10-CM | POA: Diagnosis not present

## 2017-06-21 DIAGNOSIS — Z7982 Long term (current) use of aspirin: Secondary | ICD-10-CM | POA: Diagnosis not present

## 2017-06-21 DIAGNOSIS — I5032 Chronic diastolic (congestive) heart failure: Secondary | ICD-10-CM | POA: Diagnosis not present

## 2017-06-21 DIAGNOSIS — Z7984 Long term (current) use of oral hypoglycemic drugs: Secondary | ICD-10-CM | POA: Diagnosis not present

## 2017-06-21 DIAGNOSIS — I493 Ventricular premature depolarization: Secondary | ICD-10-CM | POA: Diagnosis not present

## 2017-06-21 DIAGNOSIS — E785 Hyperlipidemia, unspecified: Secondary | ICD-10-CM | POA: Diagnosis not present

## 2017-06-21 DIAGNOSIS — R079 Chest pain, unspecified: Secondary | ICD-10-CM | POA: Diagnosis not present

## 2017-06-22 DIAGNOSIS — R0600 Dyspnea, unspecified: Secondary | ICD-10-CM | POA: Diagnosis not present

## 2017-06-22 DIAGNOSIS — I2510 Atherosclerotic heart disease of native coronary artery without angina pectoris: Secondary | ICD-10-CM | POA: Diagnosis not present

## 2017-06-22 DIAGNOSIS — E785 Hyperlipidemia, unspecified: Secondary | ICD-10-CM | POA: Diagnosis not present

## 2017-06-22 DIAGNOSIS — I10 Essential (primary) hypertension: Secondary | ICD-10-CM | POA: Diagnosis not present

## 2017-06-22 DIAGNOSIS — I5032 Chronic diastolic (congestive) heart failure: Secondary | ICD-10-CM | POA: Diagnosis not present

## 2017-06-22 DIAGNOSIS — I200 Unstable angina: Secondary | ICD-10-CM | POA: Diagnosis not present

## 2017-06-22 DIAGNOSIS — E119 Type 2 diabetes mellitus without complications: Secondary | ICD-10-CM | POA: Diagnosis not present

## 2017-06-22 DIAGNOSIS — R079 Chest pain, unspecified: Secondary | ICD-10-CM | POA: Diagnosis not present

## 2017-06-22 MED ORDER — NITROGLYCERIN 0.4 MG SL SUBL
.40 | SUBLINGUAL_TABLET | SUBLINGUAL | Status: DC
Start: ? — End: 2017-06-22

## 2017-06-22 MED ORDER — LEVOTHYROXINE SODIUM 112 MCG PO TABS
112.00 | ORAL_TABLET | ORAL | Status: DC
Start: 2017-06-23 — End: 2017-06-22

## 2017-06-22 MED ORDER — ALBUTEROL SULFATE HFA 108 (90 BASE) MCG/ACT IN AERS
2.00 | INHALATION_SPRAY | RESPIRATORY_TRACT | Status: DC
Start: ? — End: 2017-06-22

## 2017-06-22 MED ORDER — ASPIRIN 81 MG PO CHEW
81.00 | CHEWABLE_TABLET | ORAL | Status: DC
Start: 2017-06-23 — End: 2017-06-22

## 2017-06-22 MED ORDER — ISOSORBIDE MONONITRATE ER 30 MG PO TB24
120.00 | ORAL_TABLET | ORAL | Status: DC
Start: 2017-06-23 — End: 2017-06-22

## 2017-06-22 MED ORDER — CLOPIDOGREL BISULFATE 75 MG PO TABS
75.00 | ORAL_TABLET | ORAL | Status: DC
Start: 2017-06-23 — End: 2017-06-22

## 2017-06-22 MED ORDER — DEXTROSE 50 % IV SOLN
12.50 | INTRAVENOUS | Status: DC
Start: ? — End: 2017-06-22

## 2017-06-22 MED ORDER — TRAMADOL HCL 50 MG PO TABS
50.00 | ORAL_TABLET | ORAL | Status: DC
Start: ? — End: 2017-06-22

## 2017-06-22 MED ORDER — BUMETANIDE 1 MG PO TABS
2.00 | ORAL_TABLET | ORAL | Status: DC
Start: 2017-06-23 — End: 2017-06-22

## 2017-06-22 MED ORDER — LISINOPRIL 10 MG PO TABS
20.00 | ORAL_TABLET | ORAL | Status: DC
Start: 2017-06-23 — End: 2017-06-22

## 2017-06-22 MED ORDER — MAGNESIUM OXIDE 400 MG PO TABS
400.00 | ORAL_TABLET | ORAL | Status: DC
Start: 2017-06-23 — End: 2017-06-22

## 2017-06-22 MED ORDER — ATORVASTATIN CALCIUM 20 MG PO TABS
20.00 | ORAL_TABLET | ORAL | Status: DC
Start: 2017-06-23 — End: 2017-06-22

## 2017-06-22 MED ORDER — ACETAMINOPHEN 325 MG PO TABS
650.00 | ORAL_TABLET | ORAL | Status: DC
Start: ? — End: 2017-06-22

## 2017-06-22 MED ORDER — CITALOPRAM HYDROBROMIDE 20 MG PO TABS
20.00 | ORAL_TABLET | ORAL | Status: DC
Start: 2017-06-23 — End: 2017-06-22

## 2017-06-22 MED ORDER — AMLODIPINE BESYLATE 5 MG PO TABS
5.00 | ORAL_TABLET | ORAL | Status: DC
Start: 2017-06-23 — End: 2017-06-22

## 2017-06-22 MED ORDER — INSULIN LISPRO 100 UNIT/ML ~~LOC~~ SOLN
.00 | SUBCUTANEOUS | Status: DC
Start: 2017-06-22 — End: 2017-06-22

## 2017-06-23 ENCOUNTER — Ambulatory Visit: Payer: Medicare Other

## 2017-06-23 DIAGNOSIS — I5032 Chronic diastolic (congestive) heart failure: Secondary | ICD-10-CM

## 2017-06-23 NOTE — Progress Notes (Signed)
Pulmonary Individual Treatment Plan  Patient Details  Name: Chad [name] MRN: [id number] Date of Birth: 10-Nov-1955 Referring Provider:     Pulmonary Rehab from 05/15/2017 in [facility] Cardiac and Pulmonary Rehab  Referring Provider  [name] MD      Initial Encounter Date:    Pulmonary Rehab from 05/15/2017 in [facility] Cardiac and Pulmonary Rehab  Date  05/15/17  Referring Provider  [name] MD      Visit Diagnosis: Heart failure, diastolic, chronic ([place])  Patient's Home Medications on Admission:  Current Outpatient Medications:  .  amLODipine (NORVASC) 5 MG tablet, Take 1 tablet (5 mg total) by mouth daily., Disp: , Rfl:  .  aspirin 81 MG chewable tablet, Chew 81 mg by mouth daily. , Disp: , Rfl:  .  atorvastatin (LIPITOR) 20 MG tablet, Take 1 tablet (20 mg total) by mouth daily., Disp: , Rfl:  .  bumetanide (BUMEX) 2 MG tablet, Take 0.5 tablets (1 mg total) by mouth 2 (two) times daily as needed (weight gian/leg swelling)., Disp: , Rfl:  .  citalopram (CELEXA) 20 MG tablet, TAKE 1 TABLET BY MOUTH ONCE DAILY, Disp: 30 tablet, Rfl: 1 .  Docusate Sodium (DSS) 100 MG CAPS, 1 tab 2 times a day while on narcotics.  STOOL SOFTENER, Disp: 60 each, Rfl: 0 .  fluticasone (FLONASE) 50 MCG/ACT nasal spray, PLACE 2 SPRAYS INTO BOTH NOSTRILS DAILY, Disp: 16 g, Rfl: 2 .  glimepiride (AMARYL) 2 MG tablet, Take 1 tablet (2 mg total) by mouth daily as needed (hyperglycemia)., Disp: , Rfl:  .  Insulin Glargine (BASAGLAR KWIKPEN) 100 UNIT/ML SOPN, Inject 0.5 mLs (50 Units total) into the skin at bedtime., Disp: , Rfl:  .  Insulin Pen Needle (ULTICARE MINI PEN NEEDLES) 31G X 6 MM MISC, USE AS DIRECTED FOR LANTUS SOLOSTAR PEN, Disp: 100 each, Rfl: 3 .  isosorbide mononitrate (IMDUR) 120 MG 24 hr tablet, Take 1 tablet (120 mg total) by mouth daily., Disp: , Rfl:  .  levothyroxine (SYNTHROID, LEVOTHROID) 112 MCG tablet, Take 1 tablet (112 mcg total) by mouth daily., Disp: 90 tablet, Rfl: 3 .   lisinopril (PRINIVIL,ZESTRIL) 20 MG tablet, Take 1 tablet (20 mg total) by mouth daily., Disp: 90 tablet, Rfl: 3 .  metFORMIN (GLUCOPHAGE) 1000 MG tablet, TAKE 1 TABLET BY MOUTH TWICE A DAY WITH A MEAL, Disp: 180 tablet, Rfl: 0 .  metoCLOPramide (REGLAN) 10 MG tablet, TAKE 1 TABLET BY MOUTH 3 TIMES DAILY BEFORE MEALS, Disp: 90 tablet, Rfl: 6 .  metolazone (ZAROXOLYN) 2.5 MG tablet, Take 1 tablet (2.5 mg total) by mouth daily as needed (for more than 3lb/day or 5lb/wk weight gain)., Disp: , Rfl:  .  metoprolol succinate (TOPROL-XL) 50 MG 24 hr tablet, Take 50 mg daily by mouth. Take with or immediately following a meal., Disp: , Rfl:  .  Multiple Vitamin (MULITIVITAMIN WITH MINERALS) TABS, Take 1 tablet by mouth daily., Disp: , Rfl:  .  NEXIUM 40 MG capsule, Take 1 capsule (40 mg total) by mouth 2 (two) times daily., Disp: 60 capsule, Rfl: 11 .  nitroGLYCERIN (NITROSTAT) 0.4 MG SL tablet, Place 1 tablet (0.4 mg total) under the tongue every 5 (five) minutes as needed for chest pain., Disp: 60 tablet, Rfl: 3 .  polyethylene glycol (MIRALAX / GLYCOLAX) packet, 17grams in 16 oz of water twice a day until bowel movement.  LAXITIVE.  Restart if two days since last bowel movement, Disp: 14 each, Rfl: 0 .  potassium chloride (  K-DUR,KLOR-CON) 10 MEQ tablet, Take 10 mEq by mouth 2 (two) times daily., Disp: , Rfl:  .  ULTICARE MINI PEN NEEDLES 31G X 6 MM MISC, , Disp: , Rfl:  .  VENTOLIN HFA 108 (90 Base) MCG/ACT inhaler, INHALE 2 PUFFS INTO THE LUNGS EVERY 6 HOURS AS NEEDED FOR WHEEZING ORSHORTNESS OF BREATH, Disp: 18 g, Rfl: 6 .  vitamin E 400 UNIT capsule, Take 400 Units by mouth daily., Disp: , Rfl:   Past Medical History: Past Medical History:  Diagnosis Date  . Abnormal drug screen    innaprop negative for hydrocodone 09/2013, inapprop negative for hydrocodone and tramadol 02/2014; inappropr negative hydrocodone 03/2015  . Acute diverticulitis 08/15/2014  . Arthritis    "both hips and knees; got shots in  each hip in August" (01/25/2013)  . Bone spur    L4 L5  . Bulging lumbar disc   . Coronary artery disease   . Diabetes mellitus without complication ([place])    no medicarions in over 2 years,wt. loss 100 lbs  . Diastolic CHF, chronic ([place]) 04/02/2012  . Diverticulosis   . Gastric bypass status for obesity 1985  . Gastritis 08/31/2015   with focal intestinal metaplasia  . GERD (gastroesophageal reflux disease)    severe, h/o gastritis and GI bleed, per pt normal EGD at [facility] 2008  . History of diabetes mellitus 1990s   with mild background retinopathy, resolved with weight loss  . HLD (hyperlipidemia)    statin caused leg cramps  . HTN (hypertension)   . Hyperglycemia glucose over 300 in last 24 hrs 07/12/2016  . Hyperplastic colon polyp 2008  . Hypothyroid   . Internal hemorrhoids   . Morbid obesity ([place])   . Narrowing of lumbar spine   . OSA (obstructive sleep apnea)    unable to use CPAP as of last try 2/2 h/o tracheostomy?  . Otomycosis of right ear 07/06/2011  . Primary localized osteoarthritis of left knee 06/29/2016  . PVC (premature ventricular contraction)    RBBB Infer axis  . Right ear pain    s/p eval by ENT - thought TMJ referred pain and sent to oral surg for dental splint  . Seasonal allergies   . Sensorineural hearing loss, bilateral    hearing aides  . Splenomegaly   . Thrombocytopenia ([place]) 06/10/2015  . Thrombocytopenia ([place]) 06/10/2015   Platelet count dropped to 73 post op day 2 after total knee   . Tinnitus    due to sensorineural hearing loss R>L with ETD  . Trifascicular block  RBBB/LPFB/1AVB     Tobacco Use: Social History   Tobacco Use  Smoking Status Never Smoker  Smokeless Tobacco Never Used    Labs: Recent Review Flowsheet Data    Labs for ITP Cardiac and Pulmonary Rehab Latest Ref Rng & Units 06/08/2016 07/11/2016 11/08/2016 12/27/2016 04/03/2017   Cholestrol 0 - 200 mg/dL - - 171 189 -   LDLCALC 0 - 99 mg/dL - - 89 123(H) -   LDLDIRECT mg/dL - -  - - -   HDL >40 mg/dL - - 50.50 47 -   Trlycerides <150 mg/dL - - 158.0(H) 97 -   Hemoglobin A1c - 7.4(H) 6.9(H) 7.3(H) 8.5(H) 7.6   TCO2 0 - 100 mmol/L - - - - -       Pulmonary Assessment Scores: Pulmonary Assessment Scores    Row Name 05/15/17 1531         ADL UCSD   ADL Phase  Entry  SOB Score total  81     Rest  1     Walk  4     Stairs  5     Bath  3     Dress  4     Shop  4       CAT Score   CAT Score  27       mMRC Score   mMRC Score  2        Pulmonary Function Assessment: Pulmonary Function Assessment - 05/15/17 1431      Breath   Bilateral Breath Sounds  Clear    Shortness of Breath  Yes;Fear of Shortness of Breath;Limiting activity       Exercise Target Goals:    Exercise Program Goal: Individual exercise prescription set using results from initial 6 min walk test and THRR while considering  patient's activity barriers and safety.    Exercise Prescription Goal: Initial exercise prescription builds to 30-45 minutes a day of aerobic activity, 2-3 days per week.  Home exercise guidelines will be given to patient during program as part of exercise prescription that the participant will acknowledge.  Activity Barriers & Risk Stratification: Activity Barriers & Cardiac Risk Stratification - 05/15/17 1525      Activity Barriers & Cardiac Risk Stratification   Activity Barriers  Deconditioning;Muscular Weakness;Shortness of Breath;Left Knee Replacement;Right Knee Replacement;Joint Problems;Chest Pain/Angina left shoulder rotator cuff repair       6 Minute Walk: 6 Minute Walk    Row Name 05/15/17 1522         6 Minute Walk   Phase  Initial     Distance  1025 feet     Walk Time  6 minutes     # of Rest Breaks  0     MPH  1.94     METS  1.73     RPE  13     Perceived Dyspnea   4     VO2 Peak  6.05     Symptoms  Yes (comment)     Comments  chest pain 6/10, SOB **took NTG 53mn prior to test**     Resting HR  61 bpm     Resting BP   116/58     Resting Oxygen Saturation   98 %     Exercise Oxygen Saturation  during 6 min walk  95 %     Max Ex. HR  77 bpm     Max Ex. BP  154/74       Interval HR   1 Minute HR  72     2 Minute HR  - error, machine not reading HR     6 Minute HR  77     2 Minute Post HR  67     Interval Heart Rate?  Yes       Interval Oxygen   Interval Oxygen?  Yes     Baseline Oxygen Saturation %  98 %     1 Minute Oxygen Saturation %  95 %     1 Minute Liters of Oxygen  0 L Room Air     2 Minute Oxygen Saturation %  95 %     2 Minute Liters of Oxygen  0 L     3 Minute Oxygen Saturation %  96 %     3 Minute Liters of Oxygen  0 L     4 Minute Oxygen Saturation %  97 %  4 Minute Liters of Oxygen  0 L     5 Minute Oxygen Saturation %  97 %     5 Minute Liters of Oxygen  0 L     6 Minute Oxygen Saturation %  98 %     6 Minute Liters of Oxygen  0 L     2 Minute Post Oxygen Saturation %  98 %     2 Minute Post Liters of Oxygen  0 L       Oxygen Initial Assessment: Oxygen Initial Assessment - 05/15/17 1432      Home Oxygen   Home Oxygen Device  None    Sleep Oxygen Prescription  None    Home Exercise Oxygen Prescription  None    Home at Rest Exercise Oxygen Prescription  None      Initial 6 min Walk   Oxygen Used  None      Program Oxygen Prescription   Program Oxygen Prescription  None      Intervention   Short Term Goals  To learn and understand importance of maintaining oxygen saturations>88%;To learn and demonstrate proper use of respiratory medications;To learn and demonstrate proper pursed lip breathing techniques or other breathing techniques.;To learn and understand importance of monitoring SPO2 with pulse oximeter and demonstrate accurate use of the pulse oximeter.    Long  Term Goals  Demonstrates proper use of MDI's;Compliance with respiratory medication;Exhibits proper breathing techniques, such as pursed lip breathing or other method taught during program  session;Maintenance of O2 saturations>88%;Verbalizes importance of monitoring SPO2 with pulse oximeter and return demonstration       Oxygen Re-Evaluation:   Oxygen Discharge (Final Oxygen Re-Evaluation):   Initial Exercise Prescription: Initial Exercise Prescription - 05/15/17 1500      Date of Initial Exercise RX and Referring Provider   Date  05/15/17    Referring Provider  [name] MD      Treadmill   MPH  1.7    Grade  0    Minutes  15    METs  2.3      Recumbant Bike   Level  1    RPM  50    Watts  12    Minutes  15    METs  2      NuStep   Level  1    SPM  80    Minutes  15    METs  2      Prescription Details   Frequency (times per week)  3    Duration  Progress to 45 minutes of aerobic exercise without signs/symptoms of physical distress      Intensity   THRR 40-80% of Max Heartrate  99-139    Ratings of Perceived Exertion  11-13    Perceived Dyspnea  0-4      Progression   Progression  Continue to progress workloads to maintain intensity without signs/symptoms of physical distress.      Resistance Training   Training Prescription  Yes    Weight  3 lbs    Reps  10-15       Perform Capillary Blood Glucose checks as needed.  Exercise Prescription Changes: Exercise Prescription Changes    Row Name 05/15/17 1500             Response to Exercise   Blood Pressure (Admit)  116/58       Blood Pressure (Exercise)  154/74       Blood Pressure (Exit)  124/74       Heart Rate (Admit)  61 bpm       Heart Rate (Exercise)  77 bpm       Heart Rate (Exit)  62 bpm       Oxygen Saturation (Admit)  98 %       Oxygen Saturation (Exercise)  95 %       Oxygen Saturation (Exit)  98 %       Rating of Perceived Exertion (Exercise)  13       Perceived Dyspnea (Exercise)  4       Symptoms  chest pain 6/10, SOB       Comments  walk test results          Exercise Comments:   Exercise Goals and Review: Exercise Goals    Row Name 05/15/17 1530               Exercise Goals   Increase Physical Activity  Yes       Intervention  Provide advice, education, support and counseling about physical activity/exercise needs.;Develop an individualized exercise prescription for aerobic and resistive training based on initial evaluation findings, risk stratification, comorbidities and participant's personal goals.       Expected Outcomes  Short Term: Attend rehab on a regular basis to increase amount of physical activity.;Long Term: Add in home exercise to make exercise part of routine and to increase amount of physical activity.;Long Term: Exercising regularly at least 3-5 days a week.       Increase Strength and Stamina  Yes       Intervention  Provide advice, education, support and counseling about physical activity/exercise needs.;Develop an individualized exercise prescription for aerobic and resistive training based on initial evaluation findings, risk stratification, comorbidities and participant's personal goals.       Expected Outcomes  Short Term: Increase workloads from initial exercise prescription for resistance, speed, and METs.;Short Term: Perform resistance training exercises routinely during rehab and add in resistance training at home;Long Term: Improve cardiorespiratory fitness, muscular endurance and strength as measured by increased METs and functional capacity (6MWT)       Able to understand and use rate of perceived exertion (RPE) scale  Yes       Intervention  Provide education and explanation on how to use RPE scale       Expected Outcomes  Short Term: Able to use RPE daily in rehab to express subjective intensity level;Long Term:  Able to use RPE to guide intensity level when exercising independently       Able to understand and use Dyspnea scale  Yes       Intervention  Provide education and explanation on how to use Dyspnea scale       Expected Outcomes  Short Term: Able to use Dyspnea scale daily in rehab to express  subjective sense of shortness of breath during exertion;Long Term: Able to use Dyspnea scale to guide intensity level when exercising independently       Knowledge and understanding of Target Heart Rate Range (THRR)  Yes       Intervention  Provide education and explanation of THRR including how the numbers were predicted and where they are located for reference       Expected Outcomes  Short Term: Able to state/look up THRR;Long Term: Able to use THRR to govern intensity when exercising independently;Short Term: Able to use daily as guideline for intensity in rehab  Able to check pulse independently  Yes       Intervention  Provide education and demonstration on how to check pulse in carotid and radial arteries.;Review the importance of being able to check your own pulse for safety during independent exercise       Expected Outcomes  Short Term: Able to explain why pulse checking is important during independent exercise;Long Term: Able to check pulse independently and accurately       Understanding of Exercise Prescription  Yes       Intervention  Provide education, explanation, and written materials on patient's individual exercise prescription       Expected Outcomes  Short Term: Able to explain program exercise prescription;Long Term: Able to explain home exercise prescription to exercise independently          Exercise Goals Re-Evaluation :   Discharge Exercise Prescription (Final Exercise Prescription Changes): Exercise Prescription Changes - 05/15/17 1500      Response to Exercise   Blood Pressure (Admit)  116/58    Blood Pressure (Exercise)  154/74    Blood Pressure (Exit)  124/74    Heart Rate (Admit)  61 bpm    Heart Rate (Exercise)  77 bpm    Heart Rate (Exit)  62 bpm    Oxygen Saturation (Admit)  98 %    Oxygen Saturation (Exercise)  95 %    Oxygen Saturation (Exit)  98 %    Rating of Perceived Exertion (Exercise)  13    Perceived Dyspnea (Exercise)  4    Symptoms   chest pain 6/10, SOB    Comments  walk test results       Nutrition:  Target Goals: Understanding of nutrition guidelines, daily intake of sodium <1514m, cholesterol <2084m calories 30% from fat and 7% or less from saturated fats, daily to have 5 or more servings of fruits and vegetables.  Biometrics: Pre Biometrics - 05/15/17 1531      Pre Biometrics   Height  5' 10.5" (1.791 m)    Weight  314 lb 9.6 oz (142.7 kg)  (Abnormal)     Waist Circumference  47 inches    Hip Circumference  54 inches    Waist to Hip Ratio  0.87 %    BMI (Calculated)  44.49        Nutrition Therapy Plan and Nutrition Goals: Nutrition Therapy & Goals - 05/15/17 1430      Personal Nutrition Goals   Comments  He wants to lose some weight. He is following a heart healthy diet at the moment.  Nutrition Appt has been scheduled for 4/9 at 1081m      Intervention Plan   Intervention  Prescribe, educate and counsel regarding individualized specific dietary modifications aiming towards targeted core components such as weight, hypertension, lipid management, diabetes, heart failure and other comorbidities.;Nutrition handout(s) given to patient.    Expected Outcomes  Short Term Goal: Understand basic principles of dietary content, such as calories, fat, sodium, cholesterol and nutrients.;Long Term Goal: Adherence to prescribed nutrition plan.       Nutrition Assessments: Nutrition Assessments - 05/15/17 1557      MEDFICTS Scores   Pre Score  35       Nutrition Goals Re-Evaluation:   Nutrition Goals Discharge (Final Nutrition Goals Re-Evaluation):   Psychosocial: Target Goals: Acknowledge presence or absence of significant depression and/or stress, maximize coping skills, provide positive support system. Participant is able to verbalize types and ability to use techniques and  skills needed for reducing stress and depression.   Initial Review & Psychosocial Screening: Initial Psych Review & Screening  - 05/15/17 1427      Initial Review   Current issues with  None Identified      Family Dynamics   Good Support System?  Yes    Comments  His wife, brother and 2 daughters are good for his support.      Barriers   Psychosocial barriers to participate in program  The patient should benefit from training in stress management and relaxation.      Screening Interventions   Interventions  Encouraged to exercise;Provide feedback about the scores to participant;Program counselor consult;To provide support and resources with identified psychosocial needs    Expected Outcomes  Short Term goal: Utilizing psychosocial counselor, staff and physician to assist with identification of specific Stressors or current issues interfering with healing process. Setting desired goal for each stressor or current issue identified.;Long Term Goal: Stressors or current issues are controlled or eliminated.;Short Term goal: Identification and review with participant of any Quality of Life or Depression concerns found by scoring the questionnaire.;Long Term goal: The participant improves quality of Life and PHQ9 Scores as seen by post scores and/or verbalization of changes       Quality of Life Scores:  Scores of 19 and below usually indicate a poorer quality of life in these areas.  A difference of  2-3 points is a clinically meaningful difference.  A difference of 2-3 points in the total score of the Quality of Life Index has been associated with significant improvement in overall quality of life, self-image, physical symptoms, and general health in studies assessing change in quality of life.  PHQ-9: Recent Review Flowsheet Data    Depression screen [facility] 2/9 05/15/2017 12/19/2016 11/08/2016 09/07/2016 04/28/2015   Decreased Interest 0 0 0 0 0   Down, Depressed, Hopeless 0 0 0 0 0   PHQ - 2 Score 0 0 0 0 0   Altered sleeping 0 - 0 - -   Tired, decreased energy 3 - 0 - -   Change in appetite 0 - 0 - -   Feeling bad or  failure about yourself  0 - 0 - -   Trouble concentrating 0 - 0 - -   Moving slowly or fidgety/restless 0 - 0 - -   Suicidal thoughts 0 - 0 - -   PHQ-9 Score 3 - 0 - -   Difficult doing work/chores Very difficult - Not difficult at all - -     Interpretation of Total Score  Total Score Depression Severity:  1-4 = Minimal depression, 5-9 = Mild depression, 10-14 = Moderate depression, 15-19 = Moderately severe depression, 20-27 = Severe depression   Psychosocial Evaluation and Intervention:   Psychosocial Re-Evaluation:   Psychosocial Discharge (Final Psychosocial Re-Evaluation):   Education: Education Goals: Education classes will be provided on a weekly basis, covering required topics. Participant will state understanding/return demonstration of topics presented.  Learning Barriers/Preferences: Learning Barriers/Preferences - 05/15/17 1432      Learning Barriers/Preferences   Learning Barriers  None    Learning Preferences  None       Education Topics:  Initial Evaluation Education: - Verbal, written and demonstration of respiratory meds, oximetry and breathing techniques. Instruction on use of nebulizers and MDIs and importance of monitoring MDI activations.   Pulmonary Rehab from 05/15/2017 in [facility] Cardiac and Pulmonary Rehab  Date  05/15/17  Educator  [facility]  Instruction  Review Code  1- Verbalizes Understanding      General Nutrition Guidelines/Fats and Fiber: -Group instruction provided by verbal, written material, models and posters to present the general guidelines for heart healthy nutrition. Gives an explanation and review of dietary fats and fiber.   Controlling Sodium/Reading Food Labels: -Group verbal and written material supporting the discussion of sodium use in heart healthy nutrition. Review and explanation with models, verbal and written materials for utilization of the food label.   Exercise Physiology & General Exercise Guidelines: - Group verbal and  written instruction with models to review the exercise physiology of the cardiovascular system and associated critical values. Provides general exercise guidelines with specific guidelines to those with heart or lung disease.    Aerobic Exercise & Resistance Training: - Gives group verbal and written instruction on the various components of exercise. Focuses on aerobic and resistive training programs and the benefits of this training and how to safely progress through these programs.   Flexibility, Balance, Mind/Body Relaxation: Provides group verbal/written instruction on the benefits of flexibility and balance training, including mind/body exercise modes such as yoga, pilates and tai chi.  Demonstration and skill practice provided.   Stress and Anxiety: - Provides group verbal and written instruction about the health risks of elevated stress and causes of high stress.  Discuss the correlation between heart/lung disease and anxiety and treatment options. Review healthy ways to manage with stress and anxiety.   Depression: - Provides group verbal and written instruction on the correlation between heart/lung disease and depressed mood, treatment options, and the stigmas associated with seeking treatment.   Exercise & Equipment Safety: - Individual verbal instruction and demonstration of equipment use and safety with use of the equipment.   Pulmonary Rehab from 05/15/2017 in [facility] Cardiac and Pulmonary Rehab  Date  05/15/17  Educator  [facility]  Instruction Review Code  1- Verbalizes Understanding      Infection Prevention: - Provides verbal and written material to individual with discussion of infection control including proper hand washing and proper equipment cleaning during exercise session.   Pulmonary Rehab from 05/15/2017 in [facility] Cardiac and Pulmonary Rehab  Date  05/15/17  Educator  [facility]  Instruction Review Code  1- Verbalizes Understanding      Falls Prevention: - Provides verbal and  written material to individual with discussion of falls prevention and safety.   Pulmonary Rehab from 05/15/2017 in [facility] Cardiac and Pulmonary Rehab  Date  05/15/17  Educator  [facility]  Instruction Review Code  1- Verbalizes Understanding      Diabetes: - Individual verbal and written instruction to review signs/symptoms of diabetes, desired ranges of glucose level fasting, after meals and with exercise. Advice that pre and post exercise glucose checks will be done for 3 sessions at entry of program.   Chronic Lung Diseases: - Group verbal and written instruction to review updates, respiratory medications, advancements in procedures and treatments. Discuss use of supplemental oxygen including available portable oxygen systems, continuous and intermittent flow rates, concentrators, personal use and safety guidelines. Review proper use of inhaler and spacers. Provide informative websites for self-education.    Energy Conservation: - Provide group verbal and written instruction for methods to conserve energy, plan and organize activities. Instruct on pacing techniques, use of adaptive equipment and posture/positioning to relieve shortness of breath.   Triggers and Exacerbations: - Group verbal and written instruction to review types of environmental triggers and ways to prevent exacerbations. Discuss weather changes, air quality and the  benefits of nasal washing. Review warning signs and symptoms to help prevent infections. Discuss techniques for effective airway clearance, coughing, and vibrations.   AED/CPR: - Group verbal and written instruction with the use of models to demonstrate the basic use of the AED with the basic ABC's of resuscitation.   Anatomy and Physiology of the Lungs: - Group verbal and written instruction with the use of models to provide basic lung anatomy and physiology related to function, structure and complications of lung disease.   Anatomy & Physiology of the  Heart: - Group verbal and written instruction and models provide basic cardiac anatomy and physiology, with the coronary electrical and arterial systems. Review of Valvular disease and Heart Failure   Cardiac Medications: - Group verbal and written instruction to review commonly prescribed medications for heart disease. Reviews the medication, class of the drug, and side effects.   Know Your Numbers and Risk Factors: -Group verbal and written instruction about important numbers in your health.  Discussion of what are risk factors and how they play a role in the disease process.  Review of Cholesterol, Blood Pressure, Diabetes, and BMI and the role they play in your overall health.   Sleep Hygiene: -Provides group verbal and written instruction about how sleep can affect your health.  Define sleep hygiene, discuss sleep cycles and impact of sleep habits. Review good sleep hygiene tips.    Other: -Provides group and verbal instruction on various topics (see comments)    Knowledge Questionnaire Score: Knowledge Questionnaire Score - 05/15/17 1431      Knowledge Questionnaire Score   Pre Score  13/18 reviewed with patient        Core Components/Risk Factors/Patient Goals at Admission: Personal Goals and Risk Factors at Admission - 05/15/17 1433      Core Components/Risk Factors/Patient Goals on Admission    Weight Management  Yes;Weight Loss;Obesity    Intervention  Weight Management: Develop a combined nutrition and exercise program designed to reach desired caloric intake, while maintaining appropriate intake of nutrient and fiber, sodium and fats, and appropriate energy expenditure required for the weight goal.;Weight Management: Provide education and appropriate resources to help participant work on and attain dietary goals.;Weight Management/Obesity: Establish reasonable short term and long term weight goals.;Obesity: Provide education and appropriate resources to help participant  work on and attain dietary goals.    Admit Weight  314 lb 9.6 oz (142.7 kg)    Goal Weight: Short Term  310 lb (140.6 kg)    Goal Weight: Long Term  304 lb (137.9 kg)    Expected Outcomes  Short Term: Continue to assess and modify interventions until short term weight is achieved;Long Term: Adherence to nutrition and physical activity/exercise program aimed toward attainment of established weight goal;Weight Maintenance: Understanding of the daily nutrition guidelines, which includes 25-35% calories from fat, 7% or less cal from saturated fats, less than 285m cholesterol, less than 1.5gm of sodium, & 5 or more servings of fruits and vegetables daily;Weight Loss: Understanding of general recommendations for a balanced deficit meal plan, which promotes 1-2 lb weight loss per week and includes a negative energy balance of [phone] kcal/d;Understanding recommendations for meals to include 15-35% energy as protein, 25-35% energy from fat, 35-60% energy from carbohydrates, less than 201mof dietary cholesterol, 20-35 gm of total fiber daily;Understanding of distribution of calorie intake throughout the day with the consumption of 4-5 meals/snacks    Improve shortness of breath with ADL's  Yes    Intervention  Provide education, individualized exercise plan and daily activity instruction to help decrease symptoms of SOB with activities of daily living.    Expected Outcomes  Short Term: Improve cardiorespiratory fitness to achieve a reduction of symptoms when performing ADLs;Long Term: Be able to perform more ADLs without symptoms or delay the onset of symptoms    Diabetes  Yes    Intervention  Provide education about signs/symptoms and action to take for hypo/hyperglycemia.;Provide education about proper nutrition, including hydration, and aerobic/resistive exercise prescription along with prescribed medications to achieve blood glucose in normal ranges: Fasting glucose 65-99 mg/dL    Expected Outcomes  Short  Term: Participant verbalizes understanding of the signs/symptoms and immediate care of hyper/hypoglycemia, proper foot care and importance of medication, aerobic/resistive exercise and nutrition plan for blood glucose control.;Long Term: Attainment of HbA1C < 7%.    Heart Failure  Yes    Intervention  Provide a combined exercise and nutrition program that is supplemented with education, support and counseling about heart failure. Directed toward relieving symptoms such as shortness of breath, decreased exercise tolerance, and extremity edema.    Expected Outcomes  Improve functional capacity of life;Short term: Attendance in program 2-3 days a week with increased exercise capacity. Reported lower sodium intake. Reported increased fruit and vegetable intake. Reports medication compliance.;Short term: Daily weights obtained and reported for increase. Utilizing diuretic protocols set by physician.;Long term: Adoption of self-care skills and reduction of barriers for early signs and symptoms recognition and intervention leading to self-care maintenance.    Hypertension  Yes    Intervention  Provide education on lifestyle modifcations including regular physical activity/exercise, weight management, moderate sodium restriction and increased consumption of fresh fruit, vegetables, and low fat dairy, alcohol moderation, and smoking cessation.;Monitor prescription use compliance.    Expected Outcomes  Short Term: Continued assessment and intervention until BP is < 140/54m HG in hypertensive participants. < 130/860mHG in hypertensive participants with diabetes, heart failure or chronic kidney disease.;Long Term: Maintenance of blood pressure at goal levels.    Lipids  Yes    Intervention  Provide education and support for participant on nutrition & aerobic/resistive exercise along with prescribed medications to achieve LDL <7077mHDL >61m39m  Expected Outcomes  Short Term: Participant states understanding of  desired cholesterol values and is compliant with medications prescribed. Participant is following exercise prescription and nutrition guidelines.;Long Term: Cholesterol controlled with medications as prescribed, with individualized exercise RX and with personalized nutrition plan. Value goals: LDL < 70mg10mL > 40 mg.       Core Components/Risk Factors/Patient Goals Review:    Core Components/Risk Factors/Patient Goals at Discharge (Final Review):    ITP Comments: ITP Comments    Row Name 05/15/17 1404 05/18/17 1230 05/29/17 0832 05/29/17 1726 06/20/17 1011   ITP Comments  Medical Evaluation completed. Chart sent for review and changes to Dr. Mark Emily Filbertctor of [facility]  [place] that he is getting a stent placed next week. Informed him to get a doctors note to [facility]. Patient verbalizes understanding.   30 day review completed. ITP sent to Dr. Mark Emily Filbertctor of [facility] with ITP unless changes are made by physician  [place] when [facility] could [facility] [place] [facility] has fluid on his heart and has to ask his doctor if it is ok to return. Patient states he will call when he gets this  information.  Left a message for Mr. Toney to call back and if he was able to resume [facility].   [place] Name 06/23/17 [phone] 06/23/17 0920         ITP Comments  Mr. Gunther has not attended since 05/15/17. No calls have be returned. Sent patient a discharge letter.  Discharge ITP sent and signed by Dr. Sabra Heck.         Comments: Discharge ITP

## 2017-06-23 NOTE — Progress Notes (Signed)
Discharge Progress Report  Patient Details  Name: Chad [name] MRN: [id number] Date of Birth: October 27, 1955 Referring Provider:     Pulmonary Rehab from 05/15/2017 in [facility]  Referring Provider  [name] MD       Number of Visits: 1/36  Reason for Discharge:  Early Exit:  Lack of attendance  Smoking History:  Social History   Tobacco Use  Smoking Status Never Smoker  Smokeless Tobacco Never Used    Diagnosis:  Heart failure, diastolic, chronic ([place])  ADL UCSD: Pulmonary Assessment Scores    Row Name 05/15/17 1531         ADL UCSD   ADL Phase  Entry     SOB Score total  81     Rest  1     Walk  4     Stairs  5     Bath  3     Dress  4     Shop  4       CAT Score   CAT Score  27       mMRC Score   mMRC Score  2        Initial Exercise Prescription: Initial Exercise Prescription - 05/15/17 1500      Date of Initial Exercise RX and Referring Provider   Date  05/15/17    Referring Provider  [name] MD      Treadmill   MPH  1.7    Grade  0    Minutes  15    METs  2.3      Recumbant Bike   Level  1    RPM  50    Watts  12    Minutes  15    METs  2      NuStep   Level  1    SPM  80    Minutes  15    METs  2      Prescription Details   Frequency (times per week)  3    Duration  Progress to 45 minutes of aerobic exercise without signs/symptoms of physical distress      Intensity   THRR 40-80% of Max Heartrate  99-139    Ratings of Perceived Exertion  11-13    Perceived Dyspnea  0-4      Progression   Progression  Continue to progress workloads to maintain intensity without signs/symptoms of physical distress.      Resistance Training   Training Prescription  Yes    Weight  3 lbs    Reps  10-15       Discharge Exercise Prescription (Final Exercise Prescription Changes): Exercise Prescription Changes - 05/15/17 1500      Response to Exercise   Blood Pressure (Admit)  116/58    Blood  Pressure (Exercise)  154/74    Blood Pressure (Exit)  124/74    Heart Rate (Admit)  61 bpm    Heart Rate (Exercise)  77 bpm    Heart Rate (Exit)  62 bpm    Oxygen Saturation (Admit)  98 %    Oxygen Saturation (Exercise)  95 %    Oxygen Saturation (Exit)  98 %    Rating of Perceived Exertion (Exercise)  13    Perceived Dyspnea (Exercise)  4    Symptoms  chest pain 6/10, SOB    Comments  walk test results       Functional Capacity: 6 Minute Walk  [place] Name 05/15/17 1522         6 Minute Walk   Phase  Initial     Distance  1025 feet     Walk Time  6 minutes     # of Rest Breaks  0     MPH  1.94     METS  1.73     RPE  13     Perceived Dyspnea   4     VO2 Peak  6.05     Symptoms  Yes (comment)     Comments  chest pain 6/10, SOB **took NTG 61mn prior to test**     Resting HR  61 bpm     Resting BP  116/58     Resting Oxygen Saturation   98 %     Exercise Oxygen Saturation  during 6 min walk  95 %     Max Ex. HR  77 bpm     Max Ex. BP  154/74       Interval HR   1 Minute HR  72     2 Minute HR  - error, machine not reading HR     6 Minute HR  77     2 Minute Post HR  67     Interval Heart Rate?  Yes       Interval Oxygen   Interval Oxygen?  Yes     Baseline Oxygen Saturation %  98 %     1 Minute Oxygen Saturation %  95 %     1 Minute Liters of Oxygen  0 L Room Air     2 Minute Oxygen Saturation %  95 %     2 Minute Liters of Oxygen  0 L     3 Minute Oxygen Saturation %  96 %     3 Minute Liters of Oxygen  0 L     4 Minute Oxygen Saturation %  97 %     4 Minute Liters of Oxygen  0 L     5 Minute Oxygen Saturation %  97 %     5 Minute Liters of Oxygen  0 L     6 Minute Oxygen Saturation %  98 %     6 Minute Liters of Oxygen  0 L     2 Minute Post Oxygen Saturation %  98 %     2 Minute Post Liters of Oxygen  0 L        Psychological, QOL, Others - Outcomes: PHQ 2/9: [facility] 05/15/2017 12/19/2016 11/08/2016 09/07/2016 04/28/2015  Decreased  Interest 0 0 0 0 0  Down, Depressed, Hopeless 0 0 0 0 0  PHQ - 2 Score 0 0 0 0 0  Altered sleeping 0 - 0 - -  Tired, decreased energy 3 - 0 - -  Change in appetite 0 - 0 - -  Feeling bad or failure about yourself  0 - 0 - -  Trouble concentrating 0 - 0 - -  Moving slowly or fidgety/restless 0 - 0 - -  Suicidal thoughts 0 - 0 - -  PHQ-9 Score 3 - 0 - -  Difficult doing work/chores Very difficult - Not difficult at all - -  Some recent data might be hidden    Quality of Life:   Personal Goals: Goals established at orientation with interventions provided to work toward goal. Personal Goals and Risk Factors at Admission - 05/15/17 1433  Core Components/Risk Factors/Patient Goals on Admission    Weight Management  Yes;Weight Loss;Obesity    Intervention  Weight Management: Develop a combined nutrition and exercise program designed to reach desired caloric intake, while maintaining appropriate intake of nutrient and fiber, sodium and fats, and appropriate energy expenditure required for the weight goal.;Weight Management: Provide education and appropriate resources to help participant work on and attain dietary goals.;Weight Management/Obesity: Establish reasonable short term and long term weight goals.;Obesity: Provide education and appropriate resources to help participant work on and attain dietary goals.    Admit Weight  314 lb 9.6 oz (142.7 kg)    Goal Weight: Short Term  310 lb (140.6 kg)    Goal Weight: Long Term  304 lb (137.9 kg)    Expected Outcomes  Short Term: Continue to assess and modify interventions until short term weight is achieved;Long Term: Adherence to nutrition and physical activity/exercise program aimed toward attainment of established weight goal;Weight Maintenance: Understanding of the daily nutrition guidelines, which includes 25-35% calories from fat, 7% or less cal from saturated fats, less than 260m cholesterol, less than 1.5gm of sodium, & 5 or more servings  of fruits and vegetables daily;Weight Loss: Understanding of general recommendations for a balanced deficit meal plan, which promotes 1-2 lb weight loss per week and includes a negative energy balance of [phone] kcal/d;Understanding recommendations for meals to include 15-35% energy as protein, 25-35% energy from fat, 35-60% energy from carbohydrates, less than 209mof dietary cholesterol, 20-35 gm of total fiber daily;Understanding of distribution of calorie intake throughout the day with the consumption of 4-5 meals/snacks    Improve shortness of breath with ADL's  Yes    Intervention  Provide education, individualized exercise plan and daily activity instruction to help decrease symptoms of SOB with activities of daily living.    Expected Outcomes  Short Term: Improve cardiorespiratory fitness to achieve a reduction of symptoms when performing ADLs;Long Term: Be able to perform more ADLs without symptoms or delay the onset of symptoms    Diabetes  Yes    Intervention  Provide education about signs/symptoms and action to take for hypo/hyperglycemia.;Provide education about proper nutrition, including hydration, and aerobic/resistive exercise prescription along with prescribed medications to achieve blood glucose in normal ranges: Fasting glucose 65-99 mg/dL    Expected Outcomes  Short Term: Participant verbalizes understanding of the signs/symptoms and immediate care of hyper/hypoglycemia, proper foot care and importance of medication, aerobic/resistive exercise and nutrition plan for blood glucose control.;Long Term: Attainment of HbA1C < 7%.    Heart Failure  Yes    Intervention  Provide a combined exercise and nutrition program that is supplemented with education, support and counseling about heart failure. Directed toward relieving symptoms such as shortness of breath, decreased exercise tolerance, and extremity edema.    Expected Outcomes  Improve functional capacity of life;Short term: Attendance  in program 2-3 days a week with increased exercise capacity. Reported lower sodium intake. Reported increased fruit and vegetable intake. Reports medication compliance.;Short term: Daily weights obtained and reported for increase. Utilizing diuretic protocols set by physician.;Long term: Adoption of self-care skills and reduction of barriers for early signs and symptoms recognition and intervention leading to self-care maintenance.    Hypertension  Yes    Intervention  Provide education on lifestyle modifcations including regular physical activity/exercise, weight management, moderate sodium restriction and increased consumption of fresh fruit, vegetables, and low fat dairy, alcohol moderation, and smoking cessation.;Monitor prescription use compliance.    Expected Outcomes  Short Term: Continued assessment and intervention until BP is < 140/58m HG in hypertensive participants. < 130/873mHG in hypertensive participants with diabetes, heart failure or chronic kidney disease.;Long Term: Maintenance of blood pressure at goal levels.    Lipids  Yes    Intervention  Provide education and support for participant on nutrition & aerobic/resistive exercise along with prescribed medications to achieve LDL <7059mHDL >39m72m  Expected Outcomes  Short Term: Participant states understanding of desired cholesterol values and is compliant with medications prescribed. Participant is following exercise prescription and nutrition guidelines.;Long Term: Cholesterol controlled with medications as prescribed, with individualized exercise RX and with personalized nutrition plan. Value goals: LDL < 70mg8mL > 40 mg.        Personal Goals Discharge:   Exercise Goals and Review: Exercise Goals    Row Name 05/15/17 1530             Exercise Goals   Increase Physical Activity  Yes       Intervention  Provide advice, education, support and counseling about physical activity/exercise needs.;Develop an individualized  exercise prescription for aerobic and resistive training based on initial evaluation findings, risk stratification, comorbidities and participant's personal goals.       Expected Outcomes  Short Term: Attend rehab on a regular basis to increase amount of physical activity.;Long Term: Add in home exercise to make exercise part of routine and to increase amount of physical activity.;Long Term: Exercising regularly at least 3-5 days a week.       Increase Strength and Stamina  Yes       Intervention  Provide advice, education, support and counseling about physical activity/exercise needs.;Develop an individualized exercise prescription for aerobic and resistive training based on initial evaluation findings, risk stratification, comorbidities and participant's personal goals.       Expected Outcomes  Short Term: Increase workloads from initial exercise prescription for resistance, speed, and METs.;Short Term: Perform resistance training exercises routinely during rehab and add in resistance training at home;Long Term: Improve cardiorespiratory fitness, muscular endurance and strength as measured by increased METs and functional capacity (6MWT)       Able to understand and use rate of perceived exertion (RPE) scale  Yes       Intervention  Provide education and explanation on how to use RPE scale       Expected Outcomes  Short Term: Able to use RPE daily in rehab to express subjective intensity level;Long Term:  Able to use RPE to guide intensity level when exercising independently       Able to understand and use Dyspnea scale  Yes       Intervention  Provide education and explanation on how to use Dyspnea scale       Expected Outcomes  Short Term: Able to use Dyspnea scale daily in rehab to express subjective sense of shortness of breath during exertion;Long Term: Able to use Dyspnea scale to guide intensity level when exercising independently       Knowledge and understanding of Target Heart Rate Range  (THRR)  Yes       Intervention  Provide education and explanation of THRR including how the numbers were predicted and where they are located for reference       Expected Outcomes  Short Term: Able to state/look up THRR;Long Term: Able to use THRR to govern intensity when exercising independently;Short Term: Able to use daily as guideline for intensity in rehab  Able to check pulse independently  Yes       Intervention  Provide education and demonstration on how to check pulse in carotid and radial arteries.;Review the importance of being able to check your own pulse for safety during independent exercise       Expected Outcomes  Short Term: Able to explain why pulse checking is important during independent exercise;Long Term: Able to check pulse independently and accurately       Understanding of Exercise Prescription  Yes       Intervention  Provide education, explanation, and written materials on patient's individual exercise prescription       Expected Outcomes  Short Term: Able to explain program exercise prescription;Long Term: Able to explain home exercise prescription to exercise independently          Nutrition & Weight - Outcomes: Pre Biometrics - 05/15/17 1531      Pre Biometrics   Height  5' 10.5" (1.791 m)    Weight  314 lb 9.6 oz (142.7 kg)  (Abnormal)     Waist Circumference  47 inches    Hip Circumference  54 inches    Waist to Hip Ratio  0.87 %    BMI (Calculated)  44.49        Nutrition: Nutrition Therapy & Goals - 05/15/17 1430      Personal Nutrition Goals   Comments  He wants to lose some weight. He is following a heart healthy diet at the moment.  Nutrition Appt has been scheduled for 4/9 at 51 am      Intervention Plan   Intervention  Prescribe, educate and counsel regarding individualized specific dietary modifications aiming towards targeted core components such as weight, hypertension, lipid management, diabetes, heart failure and other  comorbidities.;Nutrition handout(s) given to patient.    Expected Outcomes  Short Term Goal: Understand basic principles of dietary content, such as calories, fat, sodium, cholesterol and nutrients.;Long Term Goal: Adherence to prescribed nutrition plan.       Nutrition Discharge: Nutrition Assessments - 05/15/17 1557      MEDFICTS Scores   Pre Score  35       Education Questionnaire Score: Knowledge Questionnaire Score - 05/15/17 1431      Knowledge Questionnaire Score   Pre Score  13/18 reviewed with patient       Goals reviewed with patient; copy given to patient.

## 2017-06-26 ENCOUNTER — Ambulatory Visit: Payer: Medicare Other

## 2017-06-28 ENCOUNTER — Ambulatory Visit: Payer: Medicare Other

## 2017-06-30 ENCOUNTER — Emergency Department (HOSPITAL_COMMUNITY): Payer: Medicare Other

## 2017-06-30 ENCOUNTER — Other Ambulatory Visit: Payer: Self-pay

## 2017-06-30 ENCOUNTER — Ambulatory Visit: Payer: Medicare Other

## 2017-06-30 ENCOUNTER — Ambulatory Visit (INDEPENDENT_AMBULATORY_CARE_PROVIDER_SITE_OTHER): Payer: Medicare Other | Admitting: Family Medicine

## 2017-06-30 ENCOUNTER — Observation Stay (HOSPITAL_COMMUNITY)
Admission: EM | Admit: 2017-06-30 | Discharge: 2017-07-01 | Disposition: A | Discharge status: Home / Self Care | Payer: Medicare Other | Attending: Interventional Cardiology | Admitting: Interventional Cardiology

## 2017-06-30 ENCOUNTER — Encounter: Payer: Self-pay | Admitting: Family Medicine

## 2017-06-30 ENCOUNTER — Encounter (HOSPITAL_COMMUNITY): Payer: Self-pay | Admitting: Cardiology

## 2017-06-30 DIAGNOSIS — R0602 Shortness of breath: Secondary | ICD-10-CM | POA: Diagnosis not present

## 2017-06-30 DIAGNOSIS — E1165 Type 2 diabetes mellitus with hyperglycemia: Secondary | ICD-10-CM | POA: Diagnosis not present

## 2017-06-30 DIAGNOSIS — Z96653 Presence of artificial knee joint, bilateral: Secondary | ICD-10-CM | POA: Insufficient documentation

## 2017-06-30 DIAGNOSIS — K7581 Nonalcoholic steatohepatitis (NASH): Secondary | ICD-10-CM | POA: Diagnosis not present

## 2017-06-30 DIAGNOSIS — E11319 Type 2 diabetes mellitus with unspecified diabetic retinopathy without macular edema: Secondary | ICD-10-CM | POA: Diagnosis not present

## 2017-06-30 DIAGNOSIS — R079 Chest pain, unspecified: Principal | ICD-10-CM | POA: Diagnosis present

## 2017-06-30 DIAGNOSIS — E119 Type 2 diabetes mellitus without complications: Secondary | ICD-10-CM | POA: Insufficient documentation

## 2017-06-30 DIAGNOSIS — Z794 Long term (current) use of insulin: Secondary | ICD-10-CM | POA: Diagnosis not present

## 2017-06-30 DIAGNOSIS — G4733 Obstructive sleep apnea (adult) (pediatric): Secondary | ICD-10-CM | POA: Diagnosis not present

## 2017-06-30 DIAGNOSIS — R0789 Other chest pain: Secondary | ICD-10-CM | POA: Diagnosis not present

## 2017-06-30 DIAGNOSIS — Z79899 Other long term (current) drug therapy: Secondary | ICD-10-CM | POA: Insufficient documentation

## 2017-06-30 DIAGNOSIS — I2510 Atherosclerotic heart disease of native coronary artery without angina pectoris: Secondary | ICD-10-CM | POA: Diagnosis not present

## 2017-06-30 DIAGNOSIS — Z7982 Long term (current) use of aspirin: Secondary | ICD-10-CM | POA: Insufficient documentation

## 2017-06-30 DIAGNOSIS — E785 Hyperlipidemia, unspecified: Secondary | ICD-10-CM

## 2017-06-30 DIAGNOSIS — E782 Mixed hyperlipidemia: Secondary | ICD-10-CM | POA: Diagnosis not present

## 2017-06-30 DIAGNOSIS — E1169 Type 2 diabetes mellitus with other specified complication: Secondary | ICD-10-CM | POA: Diagnosis present

## 2017-06-30 DIAGNOSIS — K7460 Unspecified cirrhosis of liver: Secondary | ICD-10-CM | POA: Diagnosis present

## 2017-06-30 DIAGNOSIS — I5032 Chronic diastolic (congestive) heart failure: Secondary | ICD-10-CM | POA: Diagnosis not present

## 2017-06-30 DIAGNOSIS — K219 Gastro-esophageal reflux disease without esophagitis: Secondary | ICD-10-CM | POA: Diagnosis not present

## 2017-06-30 DIAGNOSIS — I10 Essential (primary) hypertension: Secondary | ICD-10-CM | POA: Diagnosis present

## 2017-06-30 DIAGNOSIS — I110 Hypertensive heart disease with heart failure: Secondary | ICD-10-CM | POA: Diagnosis not present

## 2017-06-30 LAB — BASIC METABOLIC PANEL
Anion gap: 17 — ABNORMAL HIGH (ref 5–15)
BUN: 20 mg/dL (ref 6–20)
CO2: 23 mmol/L (ref 22–32)
Calcium: 9.4 mg/dL (ref 8.9–10.3)
Chloride: 95 mmol/L — ABNORMAL LOW (ref 101–111)
Creatinine, Ser: 1.15 mg/dL (ref 0.61–1.24)
GFR calc Af Amer: >60 mL/min (ref >60)
GFR calc non Af Amer: >60 mL/min (ref >60)
Glucose, Bld: 192 mg/dL — ABNORMAL HIGH (ref 65–99)
Potassium: 5.4 mmol/L — ABNORMAL HIGH (ref 3.5–5.1)
Sodium: 135 mmol/L (ref 135–145)

## 2017-06-30 LAB — TROPONIN I: Troponin I: <0.03 ng/mL (ref <0.03)

## 2017-06-30 LAB — HEPATIC FUNCTION PANEL
ALT: 30 U/L (ref 17–63)
AST: 40 U/L (ref 15–41)
Albumin: 4.1 g/dL (ref 3.5–5.0)
Alkaline Phosphatase: 120 U/L (ref 38–126)
BILIRUBIN DIRECT: 0.3 mg/dL (ref 0.1–0.5)
BILIRUBIN INDIRECT: 1.4 mg/dL — AB (ref 0.3–0.9)
BILIRUBIN TOTAL: 1.7 mg/dL — AB (ref 0.3–1.2)
Total Protein: 6.7 g/dL (ref 6.5–8.1)

## 2017-06-30 LAB — GLUCOSE, CAPILLARY: GLUCOSE-CAPILLARY: 162 mg/dL — AB (ref 65–99)

## 2017-06-30 LAB — CBC
HCT: 40.7 % (ref 39.0–52.0)
Hemoglobin: 13.9 g/dL (ref 13.0–17.0)
MCH: 27.6 pg (ref 26.0–34.0)
MCHC: 34.2 g/dL (ref 30.0–36.0)
MCV: 80.8 fL (ref 78.0–100.0)
Platelets: 161 10*3/uL (ref 150–400)
RBC: 5.04 MIL/uL (ref 4.22–5.81)
RDW: 15.1 % (ref 11.5–15.5)
WBC: 6.4 10*3/uL (ref 4.0–10.5)

## 2017-06-30 LAB — HEMOGLOBIN A1C
HEMOGLOBIN A1C: 7.2 % — AB (ref 4.8–5.6)
Mean Plasma Glucose: 159.94 mg/dL

## 2017-06-30 LAB — I-STAT TROPONIN, ED
Troponin i, poc: 0 ng/mL (ref 0.00–0.08)
Troponin i, poc: 0.01 ng/mL (ref 0.00–0.08)

## 2017-06-30 LAB — CREATININE, SERUM
Creatinine, Ser: 1.13 mg/dL (ref 0.61–1.24)
GFR calc Af Amer: >60 mL/min (ref >60)

## 2017-06-30 LAB — CBG MONITORING, ED: Glucose-Capillary: 111 mg/dL — ABNORMAL HIGH (ref 65–99)

## 2017-06-30 LAB — D-DIMER, QUANTITATIVE: D-Dimer, Quant: <0.27 ug/mL-FEU (ref 0.00–0.50)

## 2017-06-30 LAB — BRAIN NATRIURETIC PEPTIDE: B Natriuretic Peptide: 15.8 pg/mL (ref 0.0–100.0)

## 2017-06-30 MED ORDER — NITROGLYCERIN 0.4 MG SL SUBL
0.4000 mg | SUBLINGUAL_TABLET | SUBLINGUAL | Status: DC | PRN
  Administered 2017-06-30 – 2017-07-01 (×5): 0.4 mg via SUBLINGUAL
  Filled 2017-06-30: qty 1

## 2017-06-30 MED ORDER — ONDANSETRON HCL 4 MG/2ML IJ SOLN: 4.0000 mg | Freq: Four times a day (QID) | INTRAMUSCULAR | Status: DC | PRN

## 2017-06-30 MED ORDER — HEPARIN SODIUM (PORCINE) 5000 UNIT/ML IJ SOLN
5000.0000 [IU] | Freq: Three times a day (TID) | INTRAMUSCULAR | Status: DC
  Administered 2017-06-30 – 2017-07-01 (×2): 5000 [IU] via SUBCUTANEOUS
  Filled 2017-06-30 (×2): qty 1

## 2017-06-30 MED ORDER — ASPIRIN 81 MG PO CHEW
324.0000 mg | CHEWABLE_TABLET | ORAL | Status: AC
  Administered 2017-06-30: 324 mg via ORAL
  Filled 2017-06-30: qty 4

## 2017-06-30 MED ORDER — ATORVASTATIN CALCIUM 20 MG PO TABS
20.0000 mg | ORAL_TABLET | Freq: Every day | ORAL | Status: DC
  Administered 2017-07-01: 20 mg via ORAL
  Filled 2017-06-30: qty 1

## 2017-06-30 MED ORDER — INSULIN ASPART 100 UNIT/ML ~~LOC~~ SOLN
0.0000 [IU] | Freq: Three times a day (TID) | SUBCUTANEOUS | Status: DC
  Administered 2017-07-01: 3 [IU] via SUBCUTANEOUS
  Administered 2017-07-01: 8 [IU] via SUBCUTANEOUS

## 2017-06-30 MED ORDER — ENSURE ENLIVE PO LIQD: 237.0000 mL | Freq: Two times a day (BID) | ORAL | Status: DC

## 2017-06-30 MED ORDER — ASPIRIN EC 81 MG PO TBEC
81.0000 mg | DELAYED_RELEASE_TABLET | Freq: Every day | ORAL | Status: DC
  Administered 2017-07-01: 81 mg via ORAL
  Filled 2017-06-30: qty 1

## 2017-06-30 MED ORDER — ISOSORBIDE MONONITRATE ER 60 MG PO TB24
120.0000 mg | ORAL_TABLET | Freq: Every day | ORAL | Status: DC
  Administered 2017-07-01: 120 mg via ORAL
  Filled 2017-06-30: qty 2

## 2017-06-30 MED ORDER — METOPROLOL SUCCINATE ER 50 MG PO TB24
50.0000 mg | ORAL_TABLET | Freq: Every day | ORAL | Status: DC
  Filled 2017-06-30: qty 1

## 2017-06-30 MED ORDER — ASPIRIN 81 MG PO CHEW: 81.0000 mg | CHEWABLE_TABLET | Freq: Every day | ORAL | Status: DC

## 2017-06-30 MED ORDER — LISINOPRIL 20 MG PO TABS
20.0000 mg | ORAL_TABLET | Freq: Every day | ORAL | Status: DC
  Administered 2017-07-01: 20 mg via ORAL
  Filled 2017-06-30: qty 1

## 2017-06-30 MED ORDER — ASPIRIN 300 MG RE SUPP: 300.0000 mg | RECTAL | Status: AC

## 2017-06-30 MED ORDER — LEVOTHYROXINE SODIUM 112 MCG PO TABS
112.0000 ug | ORAL_TABLET | Freq: Every day | ORAL | Status: DC
  Administered 2017-07-01: 112 ug via ORAL
  Filled 2017-06-30 (×2): qty 1

## 2017-06-30 MED ORDER — ACETAMINOPHEN 325 MG PO TABS
650.0000 mg | ORAL_TABLET | ORAL | Status: DC | PRN
  Administered 2017-06-30: 650 mg via ORAL
  Filled 2017-06-30: qty 2

## 2017-06-30 MED ORDER — BUMETANIDE 2 MG PO TABS
2.0000 mg | ORAL_TABLET | Freq: Every day | ORAL | Status: DC
  Administered 2017-07-01: 2 mg via ORAL
  Filled 2017-06-30: qty 1

## 2017-06-30 MED ORDER — NITROGLYCERIN 0.4 MG SL SUBL
0.4000 mg | SUBLINGUAL_TABLET | Freq: Once | SUBLINGUAL | Status: AC
  Administered 2017-06-30: 0.4 mg via SUBLINGUAL

## 2017-06-30 MED ORDER — AMLODIPINE BESYLATE 5 MG PO TABS
5.0000 mg | ORAL_TABLET | Freq: Every day | ORAL | Status: DC
  Administered 2017-07-01: 5 mg via ORAL
  Filled 2017-06-30: qty 1

## 2017-06-30 MED ORDER — CITALOPRAM HYDROBROMIDE 20 MG PO TABS
20.0000 mg | ORAL_TABLET | Freq: Every day | ORAL | Status: DC
  Administered 2017-07-01: 20 mg via ORAL
  Filled 2017-06-30: qty 1
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
## 2017-06-30 NOTE — ED Notes (Signed)
Ordered dinner

## 2017-06-30 NOTE — ED Notes (Signed)
Back from xray

## 2017-06-30 NOTE — Plan of Care (Signed)
  Problem: Education: Goal: Knowledge of General Education information will improve Outcome: Completed/Met

## 2017-06-30 NOTE — Progress Notes (Signed)
62 y.o. male with PMH of HTN, CAD s/p PCI to mid LAD w/DES x111/2018, HFpEF, DM2 who presented with complaints of Chest pain.  Ongoing.  Started this week.  Used NTG x4 last night.  Feels diffusely weak.   Still with CP now. No NTG use this AM prior to arrival at clinic.  CP with exertion, not at rest.  Radiates to L arm.   Apparently he did not actually come in for chest pain.  He came in because he allegedly wanted a skin lesion on the left side of his thorax checked.  Apparently he did not tell triage about the chest pain/shortness of breath.  PMH and SH reviewed  ROS: Per HPI unless specifically indicated in ROS section   Meds, vitals, and allergies reviewed.   GEN: nad, appears SOB but speaking in nearly complete sentences.  NECK: supple w/o LA CV: rrr. PULM: ctab, no inc wob EXT: no edema He does have a what appears to be a ringworm on the left side of his thorax.

## 2017-06-30 NOTE — Assessment & Plan Note (Addendum)
EMS called ASAP, in route.  Needs ER eval.  D/w pt.   NTG x1 SL given, placed on O2.    L arm pain improved after 1 dose of NTG.  EMS now arrived and check out given with transport pending.   >25 minutes spent in face to face time with patient, >50% spent in counselling or coordination of care.
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
## 2017-06-30 NOTE — ED Triage Notes (Signed)
Cp since last Sunday pain  Came and went yesterday did not go away got worse last night took 4 nitro yesterday and went to dr  For a rash and was given a nitro at dr office and 3 per ems has IV left hand, states has a heart hx

## 2017-07-01 ENCOUNTER — Encounter (HOSPITAL_COMMUNITY): Payer: Self-pay

## 2017-07-01 DIAGNOSIS — R079 Chest pain, unspecified: Secondary | ICD-10-CM | POA: Diagnosis not present

## 2017-07-01 DIAGNOSIS — I2510 Atherosclerotic heart disease of native coronary artery without angina pectoris: Secondary | ICD-10-CM | POA: Diagnosis not present

## 2017-07-01 DIAGNOSIS — G4733 Obstructive sleep apnea (adult) (pediatric): Secondary | ICD-10-CM | POA: Diagnosis not present

## 2017-07-01 DIAGNOSIS — Z79899 Other long term (current) drug therapy: Secondary | ICD-10-CM | POA: Diagnosis not present

## 2017-07-01 DIAGNOSIS — Z7982 Long term (current) use of aspirin: Secondary | ICD-10-CM | POA: Diagnosis not present

## 2017-07-01 DIAGNOSIS — Z794 Long term (current) use of insulin: Secondary | ICD-10-CM | POA: Diagnosis not present

## 2017-07-01 DIAGNOSIS — Z96653 Presence of artificial knee joint, bilateral: Secondary | ICD-10-CM | POA: Diagnosis not present

## 2017-07-01 DIAGNOSIS — I110 Hypertensive heart disease with heart failure: Secondary | ICD-10-CM | POA: Diagnosis not present

## 2017-07-01 DIAGNOSIS — K219 Gastro-esophageal reflux disease without esophagitis: Secondary | ICD-10-CM | POA: Diagnosis not present

## 2017-07-01 DIAGNOSIS — I5032 Chronic diastolic (congestive) heart failure: Secondary | ICD-10-CM | POA: Diagnosis not present

## 2017-07-01 DIAGNOSIS — E119 Type 2 diabetes mellitus without complications: Secondary | ICD-10-CM | POA: Diagnosis not present

## 2017-07-01 DIAGNOSIS — K7581 Nonalcoholic steatohepatitis (NASH): Secondary | ICD-10-CM | POA: Diagnosis not present

## 2017-07-01 LAB — LIPID PANEL
CHOL/HDL RATIO: 4 ratio
Cholesterol: 100 mg/dL (ref 0–200)
HDL: 25 mg/dL — ABNORMAL LOW (ref >40)
LDL CALC: 46 mg/dL (ref 0–99)
Triglycerides: 147 mg/dL (ref <150)
VLDL: 29 mg/dL (ref 0–40)

## 2017-07-01 LAB — BASIC METABOLIC PANEL
ANION GAP: 13 (ref 5–15)
BUN: 17 mg/dL (ref 6–20)
CALCIUM: 9 mg/dL (ref 8.9–10.3)
CO2: 30 mmol/L (ref 22–32)
Chloride: 95 mmol/L — ABNORMAL LOW (ref 101–111)
Creatinine, Ser: 1.03 mg/dL (ref 0.61–1.24)
GFR calc non Af Amer: >60 mL/min (ref >60)
Glucose, Bld: 176 mg/dL — ABNORMAL HIGH (ref 65–99)
POTASSIUM: 3.1 mmol/L — AB (ref 3.5–5.1)
Sodium: 138 mmol/L (ref 135–145)

## 2017-07-01 LAB — CBC
HCT: 38.1 % — ABNORMAL LOW (ref 39.0–52.0)
Hemoglobin: 12.6 g/dL — ABNORMAL LOW (ref 13.0–17.0)
MCH: 27.5 pg (ref 26.0–34.0)
MCHC: 33.1 g/dL (ref 30.0–36.0)
MCV: 83 fL (ref 78.0–100.0)
PLATELETS: 113 10*3/uL — AB (ref 150–400)
RBC: 4.59 MIL/uL (ref 4.22–5.81)
RDW: 15.2 % (ref 11.5–15.5)
WBC: 4.9 10*3/uL (ref 4.0–10.5)

## 2017-07-01 LAB — GLUCOSE, CAPILLARY
GLUCOSE-CAPILLARY: 172 mg/dL — AB (ref 65–99)
GLUCOSE-CAPILLARY: 251 mg/dL — AB (ref 65–99)

## 2017-07-01 LAB — TROPONIN I
Troponin I: <0.03 ng/mL (ref <0.03)
Troponin I: <0.03 ng/mL (ref <0.03)

## 2017-07-01 NOTE — Progress Notes (Signed)
Subjective:  Denies SSCP, palpitations or Dyspnea   Objective:  Vitals:   06/30/17 2104 07/01/17 0442 07/01/17 0855 07/01/17 0900  BP: 112/67 123/66 134/61 134/61  Pulse:  73    Resp:  18    Temp:  98.3 F (36.8 C)    TempSrc:  Oral    SpO2:  100%    Weight: 297 lb 8 oz (134.9 kg) 297 lb 3.2 oz (134.8 kg)    Height: 5' 9"  (1.753 m)       Intake/Output from previous day:  Intake/Output Summary (Last 24 hours) at 07/01/2017 0935 Last data filed at 07/01/2017 0443 Gross per 24 hour  Intake -  Output 300 ml  Net -300 ml    Physical Exam: Affect appropriate Obese white male  HEENT: normal Neck supple with no adenopathy JVP normal no bruits no thyromegaly Lungs clear with no wheezing and good diaphragmatic motion Heart:  S1/S2 no murmur, no rub, gallop or click PMI normal Abdomen: benighn, BS positve, no tenderness, no AAA no bruit.  No HSM or HJR Distal pulses intact with no bruits No edema Neuro non-focal Skin warm and dry No muscular weakness   Lab Results: Basic Metabolic Panel: Recent Labs    06/30/17 1147 06/30/17 1911 07/01/17 0528  NA 135  --  138  K 5.4*  --  3.1*  CL 95*  --  95*  CO2 23  --  30  GLUCOSE 192*  --  176*  BUN 20  --  17  CREATININE 1.15 1.13 1.03  CALCIUM 9.4  --  9.0   Liver Function Tests: Recent Labs    06/30/17 1911  AST 40  ALT 30  ALKPHOS 120  BILITOT 1.7*  PROT 6.7  ALBUMIN 4.1   No results for input(s): LIPASE, AMYLASE in the last 72 hours. CBC: Recent Labs    06/30/17 1147 07/01/17 0528  WBC 6.4 4.9  HGB 13.9 12.6*  HCT 40.7 38.1*  MCV 80.8 83.0  PLT 161 113*   Cardiac Enzymes: Recent Labs    06/30/17 1911 06/30/17 2312 07/01/17 0528  TROPONINI <0.03 <0.03 <0.03   BNP: Invalid input(s): POCBNP D-Dimer: Recent Labs    06/30/17 2312  DDIMER <0.27   Hemoglobin A1C: Recent Labs    06/30/17 2312  HGBA1C 7.2*   Fasting Lipid Panel: Recent Labs    07/01/17 0528  CHOL 100  HDL 25*    LDLCALC 46  TRIG 147  CHOLHDL 4.0   Thyroid Function Tests: No results for input(s): TSH, T4TOTAL, T3FREE, THYROIDAB in the last 72 hours.  Invalid input(s): FREET3 Anemia Panel: No results for input(s): VITAMINB12, FOLATE, FERRITIN, TIBC, IRON, RETICCTPCT in the last 72 hours.  Imaging: Dg Chest 2 View  Result Date: 06/30/2017 CLINICAL DATA:  Acute chest pain and shortness of breath for 1 day. EXAM: CHEST - 2 VIEW COMPARISON:  04/28/2017 and prior radiographs FINDINGS: The cardiomediastinal silhouette is unremarkable. Mild peribronchial thickening again noted. There is no evidence of focal airspace disease, pulmonary edema, suspicious pulmonary nodule/mass, pleural effusion, or pneumothorax. No acute bony abnormalities are identified. IMPRESSION: No evidence of acute cardiopulmonary disease. Electronically Signed   By: Margarette Canada M.D.   On: 06/30/2017 12:59    Cardiac Studies:  ECG: NSR no acute changes    Telemetry:  NSR 07/01/2017   Echo: 12/10/16 EF 60-65%   Medications:   . amLODipine  5 mg Oral Daily  . aspirin EC  81 mg Oral Daily  .  atorvastatin  20 mg Oral Daily  . bumetanide  2 mg Oral Daily  . citalopram  20 mg Oral Daily  . feeding supplement (ENSURE ENLIVE)  237 mL Oral BID BM  . heparin  5,000 Units Subcutaneous Q8H  . insulin aspart  0-15 Units Subcutaneous TID WC  . isosorbide mononitrate  120 mg Oral Daily  . levothyroxine  112 mcg Oral QAC breakfast  . lisinopril  20 mg Oral Daily  . metoprolol succinate  50 mg Oral Daily      Assessment/Plan:   Chest Pain:  R/o no acute ECG changes history of DES to mid LAD Last cath February of this year with widely patent stent Continue medical rx D/C home outpatient f/u with [facility] cardiology  HLD:  Continue statin   HTN:  Well controlled.  Continue current medications and low sodium Dash type diet.    Thyroid:  On replacement TSH ok   Jenkins Rouge 07/01/2017, 9:35 AM

## 2017-07-01 NOTE — Discharge Summary (Signed)
Discharge Summary    Patient ID: Chad [name],  MRN: [id number], DOB/AGE: August 11, 1955 62 y.o.  Admit date: 06/30/2017 Discharge date: 07/01/2017  Primary Care Provider: [name] Primary Cardiologist: Dr. [facility]   Discharge Diagnoses    Principal Problem:   Chest pain Active Problems:   Type 2 diabetes mellitus, uncontrolled, with retinopathy (HCC)   HTN (hypertension)   HLD (hyperlipidemia)   GERD (gastroesophageal reflux disease)   OSA (obstructive sleep apnea)   Obesity, Class III, BMI 40-49.9 (morbid obesity) (HCC)   Coronary artery disease, non-occlusive   Chronic diastolic CHF (congestive heart failure) (HCC)   Liver cirrhosis secondary to NASH (HCC)   Allergies Allergies  Allergen Reactions  . Nsaids Palpitations and Other (See Comments)    ACID REFLUX   . Statins Other (See Comments)    MYALGIAS, LEG CRAMPS  . Librax [Chlordiazepoxide-Clidinium] Other (See Comments)    Urinary retention   . Tessalon [Benzonatate] Other (See Comments)    Unable to swallow due to GERD - caused throat numbness  . Codeine Nausea Only  . Sulfa Drugs Cross Reactors Nausea Only    Diagnostic Studies/Procedures    None   History of Present Illness     Chad [name] is a 62 y.o. male with a hx of CAD with DES to the mid LAD 2019 at [facility] (most recent left heart cath 04/04/2017 with no significant obstructive coronary artery disease and patent LAD stent), HFpEF, [place] cirrhosis, morbid obesity, diastolic heart failure, HLD, HTN, DM 2, obstructive sleep apnea not on CPAP, GERD admitted for chest pain rule out.   He has had multiple cardiac caths for recurrent anginal pain including cath in 2012, 2014 and 2017 most recent on 04/04/2017 which revealed no significant CAD and patent LAD stent.  His did have a cath in 12/2016 at [facility] which revealed a mid LAD occlusion at 70%.  He received one DES to this area. He states he has had recurrent chest pain since  this time although mild which is relieved with nitroglycerin.  Complicated cardiac history as stated above who presents to [facility] ED on 06/30/2017 with complaints of chest pain.  He stated he began having left-sided, anterior chest pain and progressively worsening fatigue since Sunday.  He stated he took 2 sublingual nitroglycerin at home and reported to church.  After returning home, he states he was more fatigued with worsening chest pain which he rates as a 10/10 in severity.  He stated that it has a waxing and waning nature and describes it as a burning and stabbing sensation.  He reported radiation to his left arm which includes numbness and tingling.  He also had worsening shortness of breath.  He denied orthopnea, lower extremity swelling, dizziness, palpitations, or syncope.  Patient states he had a CardioMEMS device placed recently at [facility] 05/24/2017 for his heart failure management.  [name] stated she spoke with the representative of this device earlier prior to presentation and his numbers are satisfactory indicating that he is not fluid volume overloaded.  EKG with no acute ischemic ST-T wave abnormalities, essentially unchanged from previous tracings. Troponin negative.   Hospital Course     Consultants: None  The patient admitted and ruled out. D-dimer negative. Troponin has been remained negative. No recurrent pain. EKG unchanged. Felt stable for discharge. Resume home medication. No change. BP stable.   The patient has been seen by Dr. Johnsie Cancel today and deemed ready for discharge  home. All follow-up appointments have been scheduled. Discharge medications are listed below.   Discharge Vitals Blood pressure 134/61, pulse 73, temperature 98.3 F (36.8 C), temperature source Oral, resp. rate 18, height 5' 9"  (1.753 m), weight 297 lb 3.2 oz (134.8 kg), SpO2 100 %.  Filed Weights   06/30/17 2104 07/01/17 0442  Weight: 297 lb 8 oz (134.9 kg) 297 lb 3.2 oz (134.8 kg)     Labs & Radiologic Studies     CBC Recent Labs    06/30/17 1147 07/01/17 0528  WBC 6.4 4.9  HGB 13.9 12.6*  HCT 40.7 38.1*  MCV 80.8 83.0  PLT 161 756*   Basic Metabolic Panel Recent Labs    06/30/17 1147 06/30/17 1911 07/01/17 0528  NA 135  --  138  K 5.4*  --  3.1*  CL 95*  --  95*  CO2 23  --  30  GLUCOSE 192*  --  176*  BUN 20  --  17  CREATININE 1.15 1.13 1.03  CALCIUM 9.4  --  9.0   Liver Function Tests Recent Labs    06/30/17 1911  AST 40  ALT 30  ALKPHOS 120  BILITOT 1.7*  PROT 6.7  ALBUMIN 4.1   Cardiac Enzymes Recent Labs    06/30/17 1911 06/30/17 2312 07/01/17 0528  TROPONINI <0.03 <0.03 <0.03   BNP Invalid input(s): POCBNP D-Dimer Recent Labs    06/30/17 2312  DDIMER <0.27   Hemoglobin A1C Recent Labs    06/30/17 2312  HGBA1C 7.2*   Fasting Lipid Panel Recent Labs    07/01/17 0528  CHOL 100  HDL 25*  LDLCALC 46  TRIG 147  CHOLHDL 4.0    Dg Chest 2 View  Result Date: 06/30/2017 CLINICAL DATA:  Acute chest pain and shortness of breath for 1 day. EXAM: CHEST - 2 VIEW COMPARISON:  04/28/2017 and prior radiographs FINDINGS: The cardiomediastinal silhouette is unremarkable. Mild peribronchial thickening again noted. There is no evidence of focal airspace disease, pulmonary edema, suspicious pulmonary nodule/mass, pleural effusion, or pneumothorax. No acute bony abnormalities are identified. IMPRESSION: No evidence of acute cardiopulmonary disease. Electronically Signed   By: Margarette Canada M.D.   On: 06/30/2017 12:59    Disposition   Pt is being discharged home today in good condition.  Follow-up Plans & Appointments     Discharge Instructions    Diet - low sodium heart healthy   Complete by:  As directed    Increase activity slowly   Complete by:  As directed       Discharge Medications   Allergies as of 07/01/2017      Reactions   Nsaids Palpitations, Other (See Comments)   ACID REFLUX   Statins Other (See  Comments)   MYALGIAS, LEG CRAMPS   Librax [chlordiazepoxide-clidinium] Other (See Comments)   Urinary retention    Tessalon [benzonatate] Other (See Comments)   Unable to swallow due to GERD - caused throat numbness   Codeine Nausea Only   Sulfa Drugs Cross Reactors Nausea Only      Medication List    TAKE these medications   amLODipine 5 MG tablet Commonly known as:  NORVASC Take 1 tablet (5 mg total) by mouth daily.   aspirin 81 MG chewable tablet Chew 81 mg by mouth daily.   atorvastatin 20 MG tablet Commonly known as:  LIPITOR Take 1 tablet (20 mg total) by mouth daily.   BASAGLAR KWIKPEN 100 UNIT/ML Sopn Inject 0.5 mLs (50 Units  total) into the skin at bedtime.   BUMEX 2 MG tablet Generic drug:  bumetanide Take 0.5 tablets (1 mg total) by mouth 2 (two) times daily as needed (weight gian/leg swelling).   citalopram 20 MG tablet Commonly known as:  CELEXA TAKE 1 TABLET BY MOUTH ONCE DAILY   DSS 100 MG Caps 1 tab 2 times a day while on narcotics.  STOOL SOFTENER   fluticasone 50 MCG/ACT nasal spray Commonly known as:  FLONASE PLACE 2 SPRAYS INTO BOTH NOSTRILS DAILY   glimepiride 2 MG tablet Commonly known as:  AMARYL Take 1 tablet (2 mg total) by mouth daily as needed (hyperglycemia).   isosorbide mononitrate 120 MG 24 hr tablet Commonly known as:  IMDUR Take 1 tablet (120 mg total) by mouth daily.   levothyroxine 112 MCG tablet Commonly known as:  SYNTHROID, LEVOTHROID Take 1 tablet (112 mcg total) by mouth daily.   lisinopril 20 MG tablet Commonly known as:  PRINIVIL,ZESTRIL Take 1 tablet (20 mg total) by mouth daily.   metFORMIN 1000 MG tablet Commonly known as:  GLUCOPHAGE TAKE 1 TABLET BY MOUTH TWICE A DAY WITH A MEAL   metoCLOPramide 10 MG tablet Commonly known as:  REGLAN TAKE 1 TABLET BY MOUTH 3 TIMES DAILY BEFORE MEALS   metolazone 2.5 MG tablet Commonly known as:  ZAROXOLYN Take 1 tablet (2.5 mg total) by mouth daily as needed (for more  than 3lb/day or 5lb/wk weight gain).   metoprolol succinate 50 MG 24 hr tablet Commonly known as:  TOPROL-XL Take 50 mg daily by mouth. Take with or immediately following a meal.   multivitamin with minerals Tabs tablet Take 1 tablet by mouth daily.   NEXIUM 40 MG capsule Generic drug:  esomeprazole Take 1 capsule (40 mg total) by mouth 2 (two) times daily.   nitroGLYCERIN 0.4 MG SL tablet Commonly known as:  NITROSTAT Place 1 tablet (0.4 mg total) under the tongue every 5 (five) minutes as needed for chest pain.   polyethylene glycol packet Commonly known as:  MIRALAX / GLYCOLAX 17grams in 16 oz of water twice a day until bowel movement.  LAXITIVE.  Restart if two days since last bowel movement   potassium chloride 10 MEQ tablet Commonly known as:  K-DUR,KLOR-CON Take 10 mEq by mouth 2 (two) times daily.   ULTICARE MINI PEN NEEDLES 31G X 6 MM Misc Generic drug:  Insulin Pen Needle   Insulin Pen Needle 31G X 6 MM Misc Commonly known as:  ULTICARE MINI PEN NEEDLES USE AS DIRECTED FOR LANTUS SOLOSTAR PEN   VENTOLIN HFA 108 (90 Base) MCG/ACT inhaler Generic drug:  albuterol INHALE 2 PUFFS INTO THE LUNGS EVERY 6 HOURS AS NEEDED FOR WHEEZING ORSHORTNESS OF BREATH   vitamin E 400 UNIT capsule Take 400 Units by mouth daily.        Outstanding Labs/Studies  None  Duration of Discharge Encounter   Greater than 30 minutes including physician time.  Signed, Crista Luria Barnes Florek PA-C 07/01/2017, 10:27 AM

## 2017-07-02 NOTE — Patient Instructions (Signed)
To ER

## 2017-07-03 ENCOUNTER — Ambulatory Visit: Payer: Medicare Other

## 2017-07-04 ENCOUNTER — Other Ambulatory Visit: Payer: Self-pay | Admitting: Family Medicine

## 2017-07-04 NOTE — Telephone Encounter (Signed)
Copied from [place] [id number]. Topic: Quick Communication - Rx Refill/Question >> Jul 04, 2017 10:34 AM Lennox Solders wrote: Medication:basaglar [name] Has the patient contacted their pharmacy? Yes . [place] [facility]

## 2017-07-05 ENCOUNTER — Ambulatory Visit: Payer: Medicare Other

## 2017-07-05 MED ORDER — BASAGLAR KWIKPEN 100 UNIT/ML ~~LOC~~ SOPN: 50.0000 [IU] | PEN_INJECTOR | Freq: Every day | SUBCUTANEOUS | 5 refills | Status: DC

## 2017-07-05 NOTE — Telephone Encounter (Signed)
Wife calling back to follow up on request for Insulin Glargine (BASAGLAR KWIKPEN) 100 UNIT/ML SOPN  Wife states they called the pharmacy last week, and was tldo Dr Darnell Level was out. But now pt has one pen left for tonight.  [place], [place] (Phone) [phone] (Fax)

## 2017-07-07 ENCOUNTER — Ambulatory Visit: Payer: Medicare Other

## 2017-07-10 ENCOUNTER — Ambulatory Visit
Admission: RE | Admit: 2017-07-10 | Discharge: 2017-07-10 | Disposition: A | Discharge status: Home / Self Care | Payer: Medicare Other | Source: Ambulatory Visit | Attending: Family Medicine | Admitting: Family Medicine

## 2017-07-10 ENCOUNTER — Ambulatory Visit (INDEPENDENT_AMBULATORY_CARE_PROVIDER_SITE_OTHER): Payer: Medicare Other | Admitting: Family Medicine

## 2017-07-10 ENCOUNTER — Encounter: Payer: Self-pay | Admitting: Family Medicine

## 2017-07-10 ENCOUNTER — Ambulatory Visit: Payer: Medicare Other

## 2017-07-10 VITALS — BP 124/62 | HR 66 | Temp 97.9°F | Ht 70.0 in | Wt 307.0 lb

## 2017-07-10 DIAGNOSIS — M79672 Pain in left foot: Secondary | ICD-10-CM | POA: Diagnosis not present

## 2017-07-10 DIAGNOSIS — K219 Gastro-esophageal reflux disease without esophagitis: Secondary | ICD-10-CM

## 2017-07-10 DIAGNOSIS — E1165 Type 2 diabetes mellitus with hyperglycemia: Secondary | ICD-10-CM

## 2017-07-10 DIAGNOSIS — E1142 Type 2 diabetes mellitus with diabetic polyneuropathy: Secondary | ICD-10-CM

## 2017-07-10 DIAGNOSIS — R079 Chest pain, unspecified: Secondary | ICD-10-CM | POA: Diagnosis not present

## 2017-07-10 DIAGNOSIS — E11319 Type 2 diabetes mellitus with unspecified diabetic retinopathy without macular edema: Secondary | ICD-10-CM

## 2017-07-10 DIAGNOSIS — B354 Tinea corporis: Secondary | ICD-10-CM

## 2017-07-10 DIAGNOSIS — I2510 Atherosclerotic heart disease of native coronary artery without angina pectoris: Secondary | ICD-10-CM

## 2017-07-10 DIAGNOSIS — I5032 Chronic diastolic (congestive) heart failure: Secondary | ICD-10-CM

## 2017-07-10 DIAGNOSIS — IMO0002 Reserved for concepts with insufficient information to code with codable children: Secondary | ICD-10-CM

## 2017-07-10 MED ORDER — DICLOFENAC SODIUM 1 % TD GEL: 1.0000 "application " | Freq: Three times a day (TID) | TRANSDERMAL | 1 refills | Status: DC

## 2017-07-10 MED ORDER — DEXLANSOPRAZOLE 60 MG PO CPDR
60.0000 mg | DELAYED_RELEASE_CAPSULE | Freq: Every day | ORAL | 3 refills | Status: DC
Start: 2017-07-10 — End: 2017-10-09

## 2017-07-10 NOTE — Assessment & Plan Note (Signed)
Nexium interacts with plavix. Will trial dexilant 69m once daily. If GERD sxs uncontrolled, will return to brand nexium (has failed several other PPIs)

## 2017-07-10 NOTE — Assessment & Plan Note (Signed)
Appreciate cards care of patient  CardioMems in place On bumex QD with 2nd dose as needed.  Has f/u with cards next month - discussing home oxygen.

## 2017-07-10 NOTE — Patient Instructions (Addendum)
Try dexilant 6m once daily in place of nexium 470mtwice daily.  For left foot pain - xrays today. Continue biofreeze, may try voltaren gel sent to pharmacy. Keep foot elevated when seated. Try foot compression sleeve.  Continue current medicines otherwise.  Return in 6 weeks for follow up visit.

## 2017-07-10 NOTE — Assessment & Plan Note (Signed)
L side - improving with clotrimazole

## 2017-07-10 NOTE — Assessment & Plan Note (Addendum)
Unclear cause - check xray today to eval for stress reaction.

## 2017-07-10 NOTE — Assessment & Plan Note (Signed)
Thought due to microvascular ischemia. Recent hospitalization reviewed. Has f/u with [facility] cards next month. Discussing possible home oxygen therapy.

## 2017-07-10 NOTE — Progress Notes (Signed)
BP 124/62 (BP Location: Left Arm, Patient Position: Sitting, Cuff Size: Large)   Pulse 66   Temp 97.9 F (36.6 C) (Oral)   Ht 5' 10"  (1.778 m)   Wt (!) 307 lb (139.3 kg)   SpO2 97%   BMI 44.05 kg/m    CC: hosp f/u visit Subjective:    Patient ID: Chad [name], male    DOB: 10-22-1955, 62 y.o.   MRN: [id number]  HPI: Chad [name] is a 62 y.o. male presenting on 07/10/2017 for Hospitalization Follow-up (Admitted to [facility] on 06/30[facility]17, primary dx- chest pain, unspecified.)   Seen in office 5/10 by my partner (I was out of town) with chest pain responsive to SL nitro - sent to ER via EMS. Ruled out for cardiac cause.   Last cards appt at [place] was late 04/2017. Next appt 07/25/2017.  Latest cardiac catheterization 03/2017 showing patent mid LAD DES stent without othre obstructive lesions. Continues aspirin, plavix, lipitor, imdur 163m daily, toprol XL 531mdaily. Thought microvascular ischemia.  HFpEF - takes bumex 15m65maily with additional tablet PRN weight gain.  Checks weight daily. Cardiomems in place to monitor fluid status. Takes extra bumex PRN.   Ongoing dyspnea with exertion. Discussing home oxygen trial with cardiology.  Increased stress this past week, increased chest discomfort with this.   Increasing sugar readings noted recently. Itchy patch on skin L side - improving with lotrimin.  L lateral foot pain x2 months without inciting trauma/injury or falls. No redness, swelling or warmth of foot.   Admit date: 06/30/2017 Discharge date: 07/01/2017 TCM phone call not performed  Discharge diagnosis: Principal Problem:   Chest pain Active Problems:   Type 2 diabetes mellitus, uncontrolled, with retinopathy (HCC)   HTN (hypertension)   HLD (hyperlipidemia)   GERD (gastroesophageal reflux disease)   OSA (obstructive sleep apnea)   Obesity, Class III, BMI 40-49.9 (morbid obesity) ([place]CCBo[place] Coronary artery disease, non-occlusive   Chronic diastolic CHF (congestive heart failure)  (HCCThird [place] Liver cirrhosis secondary to NASH ([facility]CL[facility] SystemPrimary Cardiologist: Dr.[facility] LPRelevant past medical, surgical, family and social history reviewed and updated as indicated. Interim medical history since our last visit reviewed. Allergies and medications reviewed and updated. Outpatient Medications Prior to Visit  Medication Sig Dispense Refill  . amLODipine (NORVASC) 5 MG tablet Take 1 tablet (5 mg total) by mouth daily.    . aMarland Kitchenpirin 81 MG chewable tablet Chew 81 mg by mouth daily.     . aMarland Kitchenorvastatin (LIPITOR) 20 MG tablet Take 1 tablet (20 mg total) by mouth daily.    . bumetanide (BUMEX) 2 MG tablet Take 0.5 tablets (1 mg total) by mouth 2 (two) times daily as needed (weight gian/leg swelling).    . citalopram (CELEXA) 20 MG tablet TAKE 1 TABLET BY MOUTH ONCE DAILY 30 tablet 1  . Docusate Sodium (DSS) 100 MG CAPS 1 tab 2 times a day while on narcotics.  STOOL SOFTENER 60 each 0  . fluticasone (FLONASE) 50 MCG/ACT nasal spray PLACE 2 SPRAYS INTO BOTH NOSTRILS DAILY 16 g 2  . glimepiride (AMARYL) 2 MG tablet Take 1 tablet (2 mg total) by mouth daily as needed (hyperglycemia).    . Insulin Glargine (BASAGLAR KWIKPEN) 100 UNIT/ML SOPN Inject 0.5 mLs (50 Units total) into the skin at bedtime. (Patient taking differently: Inject 50 Units into the skin at bedtime. Injecting 55 units daily) 15 mL 5  . Insulin Pen Needle (ULTICARE MINI PEN NEEDLES)  31G X 6 MM MISC USE AS DIRECTED FOR LANTUS SOLOSTAR PEN 100 each 3  . isosorbide mononitrate (IMDUR) 120 MG 24 hr tablet Take 1 tablet (120 mg total) by mouth daily.    Marland Kitchen levothyroxine (SYNTHROID, LEVOTHROID) 112 MCG tablet Take 1 tablet (112 mcg total) by mouth daily. 90 tablet 3  . lisinopril (PRINIVIL,ZESTRIL) 20 MG tablet Take 1 tablet (20 mg total) by mouth daily. 90 tablet 3  . metFORMIN (GLUCOPHAGE) 1000 MG tablet TAKE 1 TABLET BY MOUTH TWICE A DAY WITH A MEAL 180 tablet 0  . metoCLOPramide (REGLAN) 10 MG tablet TAKE 1 TABLET BY  MOUTH 3 TIMES DAILY BEFORE MEALS 90 tablet 6  . metolazone (ZAROXOLYN) 2.5 MG tablet Take 1 tablet (2.5 mg total) by mouth daily as needed (for more than 3lb/day or 5lb/wk weight gain).    . metoprolol succinate (TOPROL-XL) 50 MG 24 hr tablet Take 50 mg daily by mouth. Take with or immediately following a meal.    . Multiple Vitamin (MULITIVITAMIN WITH MINERALS) TABS Take 1 tablet by mouth daily.    Marland Kitchen NEXIUM 40 MG capsule Take 1 capsule (40 mg total) by mouth 2 (two) times daily. 60 capsule 11  . nitroGLYCERIN (NITROSTAT) 0.4 MG SL tablet Place 1 tablet (0.4 mg total) under the tongue every 5 (five) minutes as needed for chest pain. 60 tablet 3  . polyethylene glycol (MIRALAX / GLYCOLAX) packet 17grams in 16 oz of water twice a day until bowel movement.  LAXITIVE.  Restart if two days since last bowel movement 14 each 0  . potassium chloride (K-DUR,KLOR-CON) 10 MEQ tablet Take 10 mEq by mouth 2 (two) times daily.    Marland Kitchen ULTICARE MINI PEN NEEDLES 31G X 6 MM MISC     . VENTOLIN HFA 108 (90 Base) MCG/ACT inhaler INHALE 2 PUFFS INTO THE LUNGS EVERY 6 HOURS AS NEEDED FOR WHEEZING ORSHORTNESS OF BREATH 18 g 6  . vitamin E 400 UNIT capsule Take 400 Units by mouth daily.    . clopidogrel (PLAVIX) 75 MG tablet Take 1 tablet (75 mg total) by mouth daily.     No facility-administered medications prior to visit.      Per HPI unless specifically indicated in ROS section below Review of Systems     Objective:    BP 124/62 (BP Location: Left Arm, Patient Position: Sitting, Cuff Size: Large)   Pulse 66   Temp 97.9 F (36.6 C) (Oral)   Ht 5' 10"  (1.778 m)   Wt (!) 307 lb (139.3 kg)   SpO2 97%   BMI 44.05 kg/m   Wt Readings from Last 3 Encounters:  07/10/17 (!) 307 lb (139.3 kg)  07/01/17 297 lb 3.2 oz (134.8 kg)  06/30/17 (!) 304 lb 12 oz (138.2 kg)    Physical Exam  Constitutional: He appears well-developed and well-nourished. No distress.  HENT:  Mouth/Throat: Oropharynx is clear and moist. No  oropharyngeal exudate.  Cardiovascular: Normal rate, regular rhythm and normal heart sounds.  No murmur heard. Pulmonary/Chest: Effort normal and breath sounds normal. No respiratory distress. He has no wheezes. He has no rales.  Musculoskeletal: He exhibits no edema.  2+ DP bilaterally Point tender to palpation L lateral midfoot at proximal 5th MT shaft No pain at base of 5th MT  Skin: Skin is warm and dry. No rash noted.  Nursing note and vitals reviewed.     Assessment & Plan:   Problem List Items Addressed This Visit    Chest  pain    Thought due to microvascular ischemia. Recent hospitalization reviewed. Has f/u with [facility] cards next month. Discussing possible home oxygen therapy.       Chronic diastolic CHF (congestive heart failure) ([place]) - Primary    Appreciate cards care of patient  CardioMems in place On bumex QD with 2nd dose as needed.  Has f/u with cards next month - discussing home oxygen.       Diabetic neuropathy associated with type 2 diabetes mellitus (HCC)    Stable period - foot pain not consistent with this      GERD (gastroesophageal reflux disease)    Nexium interacts with plavix. Will trial dexilant 69m once daily. If GERD sxs uncontrolled, will return to brand nexium (has failed several other PPIs)      Relevant Medications   dexlansoprazole (DEXILANT) 60 MG capsule   Left foot pain    Unclear cause - check xray today to eval for stress reaction.       Relevant Orders   DG Foot Complete Left   Tinea corporis    L side - improving with clotrimazole      Type 2 diabetes mellitus, uncontrolled, with retinopathy (HWal[place]    Did not bring sugar log Lab Results  Component Value Date   HGBA1C 7.2 (H) 06/30/2017             Meds ordered this encounter  Medications  . dexlansoprazole (DEXILANT) 60 MG capsule    Sig: Take 1 capsule (60 mg total) by mouth daily.    Dispense:  30 capsule    Refill:  3    Trial in place of nexium   Orders  Placed This Encounter  Procedures  . DG Foot Complete Left    Standing Status:   Future    Standing Expiration Date:   09/10/2018    Order Specific Question:   Reason for Exam (SYMPTOM  OR DIAGNOSIS REQUIRED)    Answer:   left lateral midfoot pain    Order Specific Question:   Preferred imaging location?    Answer:   [facility]    Order Specific Question:   Radiology Contrast Protocol - do NOT remove file path    Answer:   [email or web address]    Follow up plan: Return in about 1 month (around 08/07/2017) for follow up visit.  JRia Bush MD

## 2017-07-10 NOTE — Assessment & Plan Note (Signed)
Stable period - foot pain not consistent with this

## 2017-07-10 NOTE — Assessment & Plan Note (Signed)
Did not bring sugar log Lab Results  Component Value Date   HGBA1C 7.2 (H) 06/30/2017

## 2017-07-11 ENCOUNTER — Other Ambulatory Visit: Payer: Self-pay | Admitting: Family Medicine

## 2017-07-12 ENCOUNTER — Ambulatory Visit: Payer: Medicare Other

## 2017-07-12 NOTE — Telephone Encounter (Signed)
Last filled:  07/10/17, #60 Last OV:  07/10/17 Next OV:  08/21/17

## 2017-07-14 ENCOUNTER — Ambulatory Visit: Payer: Medicare Other

## 2017-07-19 ENCOUNTER — Other Ambulatory Visit: Payer: Self-pay | Admitting: Family Medicine

## 2017-07-19 ENCOUNTER — Ambulatory Visit: Payer: Medicare Other

## 2017-07-21 ENCOUNTER — Ambulatory Visit: Payer: Medicare Other

## 2017-07-24 ENCOUNTER — Ambulatory Visit: Payer: Medicare Other

## 2017-07-25 DIAGNOSIS — E785 Hyperlipidemia, unspecified: Secondary | ICD-10-CM | POA: Diagnosis not present

## 2017-07-25 DIAGNOSIS — Z955 Presence of coronary angioplasty implant and graft: Secondary | ICD-10-CM | POA: Diagnosis not present

## 2017-07-25 DIAGNOSIS — K219 Gastro-esophageal reflux disease without esophagitis: Secondary | ICD-10-CM | POA: Diagnosis not present

## 2017-07-25 DIAGNOSIS — I440 Atrioventricular block, first degree: Secondary | ICD-10-CM | POA: Diagnosis not present

## 2017-07-25 DIAGNOSIS — Z794 Long term (current) use of insulin: Secondary | ICD-10-CM | POA: Diagnosis not present

## 2017-07-25 DIAGNOSIS — Z833 Family history of diabetes mellitus: Secondary | ICD-10-CM | POA: Diagnosis not present

## 2017-07-25 DIAGNOSIS — Z7902 Long term (current) use of antithrombotics/antiplatelets: Secondary | ICD-10-CM | POA: Diagnosis not present

## 2017-07-25 DIAGNOSIS — R002 Palpitations: Secondary | ICD-10-CM | POA: Diagnosis not present

## 2017-07-25 DIAGNOSIS — I5030 Unspecified diastolic (congestive) heart failure: Secondary | ICD-10-CM | POA: Diagnosis not present

## 2017-07-25 DIAGNOSIS — Z8679 Personal history of other diseases of the circulatory system: Secondary | ICD-10-CM | POA: Diagnosis not present

## 2017-07-25 DIAGNOSIS — I25110 Atherosclerotic heart disease of native coronary artery with unstable angina pectoris: Secondary | ICD-10-CM | POA: Diagnosis not present

## 2017-07-25 DIAGNOSIS — Z8249 Family history of ischemic heart disease and other diseases of the circulatory system: Secondary | ICD-10-CM | POA: Diagnosis not present

## 2017-07-25 DIAGNOSIS — Z7982 Long term (current) use of aspirin: Secondary | ICD-10-CM | POA: Diagnosis not present

## 2017-07-25 DIAGNOSIS — E871 Hypo-osmolality and hyponatremia: Secondary | ICD-10-CM | POA: Diagnosis not present

## 2017-07-25 DIAGNOSIS — E119 Type 2 diabetes mellitus without complications: Secondary | ICD-10-CM | POA: Diagnosis not present

## 2017-07-25 DIAGNOSIS — I110 Hypertensive heart disease with heart failure: Secondary | ICD-10-CM | POA: Diagnosis not present

## 2017-07-25 DIAGNOSIS — I4510 Unspecified right bundle-branch block: Secondary | ICD-10-CM | POA: Diagnosis not present

## 2017-07-25 DIAGNOSIS — I2510 Atherosclerotic heart disease of native coronary artery without angina pectoris: Secondary | ICD-10-CM | POA: Diagnosis not present

## 2017-07-25 DIAGNOSIS — Z79899 Other long term (current) drug therapy: Secondary | ICD-10-CM | POA: Diagnosis not present

## 2017-07-26 ENCOUNTER — Ambulatory Visit: Payer: Medicare Other

## 2017-07-28 ENCOUNTER — Ambulatory Visit: Payer: Medicare Other

## 2017-07-31 ENCOUNTER — Ambulatory Visit: Payer: Medicare Other

## 2017-08-02 ENCOUNTER — Ambulatory Visit: Payer: Medicare Other

## 2017-08-03 DIAGNOSIS — I5030 Unspecified diastolic (congestive) heart failure: Secondary | ICD-10-CM | POA: Diagnosis not present

## 2017-08-03 DIAGNOSIS — Z79899 Other long term (current) drug therapy: Secondary | ICD-10-CM | POA: Diagnosis not present

## 2017-08-04 ENCOUNTER — Ambulatory Visit: Payer: Medicare Other

## 2017-08-04 DIAGNOSIS — R002 Palpitations: Secondary | ICD-10-CM | POA: Diagnosis not present

## 2017-08-07 ENCOUNTER — Ambulatory Visit: Payer: Medicare Other

## 2017-08-08 DIAGNOSIS — R002 Palpitations: Secondary | ICD-10-CM | POA: Diagnosis not present

## 2017-08-09 ENCOUNTER — Ambulatory Visit: Payer: Medicare Other

## 2017-08-11 ENCOUNTER — Ambulatory Visit: Payer: Medicare Other

## 2017-08-14 ENCOUNTER — Ambulatory Visit: Payer: Medicare Other

## 2017-08-16 ENCOUNTER — Ambulatory Visit: Payer: Medicare Other

## 2017-08-21 ENCOUNTER — Ambulatory Visit (INDEPENDENT_AMBULATORY_CARE_PROVIDER_SITE_OTHER): Payer: Medicare Other | Admitting: Family Medicine

## 2017-08-21 ENCOUNTER — Encounter: Payer: Self-pay | Admitting: Family Medicine

## 2017-08-21 VITALS — BP 132/66 | HR 73 | Temp 97.8°F | Ht 70.0 in | Wt 309.0 lb

## 2017-08-21 DIAGNOSIS — R079 Chest pain, unspecified: Secondary | ICD-10-CM

## 2017-08-21 DIAGNOSIS — E1143 Type 2 diabetes mellitus with diabetic autonomic (poly)neuropathy: Secondary | ICD-10-CM | POA: Diagnosis not present

## 2017-08-21 DIAGNOSIS — K3184 Gastroparesis: Secondary | ICD-10-CM

## 2017-08-21 DIAGNOSIS — E11319 Type 2 diabetes mellitus with unspecified diabetic retinopathy without macular edema: Secondary | ICD-10-CM

## 2017-08-21 DIAGNOSIS — E1142 Type 2 diabetes mellitus with diabetic polyneuropathy: Secondary | ICD-10-CM | POA: Diagnosis not present

## 2017-08-21 DIAGNOSIS — I2510 Atherosclerotic heart disease of native coronary artery without angina pectoris: Secondary | ICD-10-CM | POA: Diagnosis not present

## 2017-08-21 DIAGNOSIS — E1165 Type 2 diabetes mellitus with hyperglycemia: Secondary | ICD-10-CM

## 2017-08-21 DIAGNOSIS — IMO0002 Reserved for concepts with insufficient information to code with codable children: Secondary | ICD-10-CM

## 2017-08-21 DIAGNOSIS — B354 Tinea corporis: Secondary | ICD-10-CM

## 2017-08-21 DIAGNOSIS — I5032 Chronic diastolic (congestive) heart failure: Secondary | ICD-10-CM

## 2017-08-21 MED ORDER — CLOTRIMAZOLE-BETAMETHASONE 1-0.05 % EX CREA: 1.0000 "application " | TOPICAL_CREAM | Freq: Two times a day (BID) | CUTANEOUS | 0 refills | Status: DC

## 2017-08-21 NOTE — Progress Notes (Signed)
BP 132/66 (BP Location: Right Arm, Patient Position: Sitting, Cuff Size: Large)   Pulse 73   Temp 97.8 F (36.6 C) (Oral)   Ht 5' 10"  (1.778 m)   Wt (!) 309 lb (140.2 kg)   SpO2 96%   BMI 44.34 kg/m    CC: 6 wk f/u visit Subjective:    Patient ID: Chad [name], male    DOB: Aug 22, 1955, 62 y.o.   MRN: [id number]  HPI: KAMARIE PALMA is a 62 y.o. male presenting on 08/21/2017 for 6 wk follow up (Pt accompanied by wife, Belenda Cruise.)   CHF - monitored with cardiomems unit. Followed by [facility] heart center. Weight gain noted. Feeling rough over the last 2 weeks - fatigued, dyspneic and lightheaded. Doubled up on bumex over the weekend, without significant benefit. Hasn't taken metolazone yet - awaiting cardiology phone call today and f/u appt with cardiology tomorrow. Having coronary spasm type pain - relieved by nitro. Feels better when laying down.   DM - does regularly check fasting sugars and brings log: 150-200. Compliant with antihyperglycemic regimen which includes: amaryl 63m daily, metformin 1002mbid, basaglar 65 units daily. Overall eating healthy - fresh fruits, vegetables. Denies low sugars or hypoglycemic symptoms. Denies paresthesias. Last diabetic eye exam 01/2017. Pneumovax: 2008, 2013. Prevnar: not due yet. Glucometer brand: one-touch. DSME: completed.  Lab Results  Component Value Date   HGBA1C 7.2 (H) 06/30/2017   Diabetic Foot Exam - Simple   No data filed     Lab Results  Component Value Date   MICROALBUR <0.7 09/15/2015     Spot on side not getting any better despite regular use of clotrimazole bid x 2 months.   Relevant past medical, surgical, family and social history reviewed and updated as indicated. Interim medical history since our last visit reviewed. Allergies and medications reviewed and updated. Outpatient Medications Prior to Visit  Medication Sig Dispense Refill  . amLODipine (NORVASC) 5 MG tablet Take 1 tablet (5 mg total) by mouth daily.    . Marland Kitchenaspirin 81 MG chewable tablet Chew 81 mg by mouth daily.     . Marland Kitchentorvastatin (LIPITOR) 20 MG tablet Take 1 tablet (20 mg total) by mouth daily.    . bumetanide (BUMEX) 2 MG tablet Take 1 tablet (2 mg total) by mouth 2 (two) times daily as needed (weight gain/leg swelling).    . citalopram (CELEXA) 20 MG tablet TAKE 1 TABLET BY MOUTH ONCE DAILY 30 tablet 1  . clopidogrel (PLAVIX) 75 MG tablet Take 1 tablet (75 mg total) by mouth daily.    . Marland Kitchenexlansoprazole (DEXILANT) 60 MG capsule Take 1 capsule (60 mg total) by mouth daily. 30 capsule 3  . diclofenac sodium (VOLTAREN) 1 % GEL Apply 1 application topically 3 (three) times daily. 1 Tube 1  . Docusate Sodium (DSS) 100 MG CAPS 1 tab 2 times a day while on narcotics.  STOOL SOFTENER 60 each 0  . fluticasone (FLONASE) 50 MCG/ACT nasal spray PLACE 2 SPRAYS INTO BOTH NOSTRILS DAILY 16 g 2  . glimepiride (AMARYL) 2 MG tablet Take 1 tablet (2 mg total) by mouth daily as needed (hyperglycemia).    . Insulin Glargine (BASAGLAR KWIKPEN) 100 UNIT/ML SOPN Inject 0.5 mLs (50 Units total) into the skin at bedtime. (Patient taking differently: Inject 50 Units into the skin at bedtime. Injecting 55 units daily) 15 mL 5  . Insulin Pen Needle (ULTICARE MINI PEN NEEDLES) 31G X 6 MM MISC USE AS DIRECTED FOR LANTUS  SOLOSTAR PEN 100 each 3  . isosorbide mononitrate (IMDUR) 60 MG 24 hr tablet TAKE 2 TABLETS (120MG TOTAL) BY MOUTH DAILY 60 tablet 11  . levothyroxine (SYNTHROID, LEVOTHROID) 112 MCG tablet Take 1 tablet (112 mcg total) by mouth daily. 90 tablet 3  . lisinopril (PRINIVIL,ZESTRIL) 20 MG tablet Take 1 tablet (20 mg total) by mouth daily. 90 tablet 3  . metFORMIN (GLUCOPHAGE) 1000 MG tablet TAKE 1 TABLET BY MOUTH TWICE A DAY WITH A MEAL 180 tablet 1  . metoCLOPramide (REGLAN) 10 MG tablet TAKE 1 TABLET BY MOUTH 3 TIMES DAILY BEFORE MEALS 90 tablet 6  . metolazone (ZAROXOLYN) 2.5 MG tablet Take 1 tablet (2.5 mg total) by mouth daily as needed (for more than 3lb/day  or 5lb/wk weight gain).    . metoprolol succinate (TOPROL-XL) 50 MG 24 hr tablet Take 50 mg daily by mouth. Take with or immediately following a meal.    . Multiple Vitamin (MULITIVITAMIN WITH MINERALS) TABS Take 1 tablet by mouth daily.    . nitroGLYCERIN (NITROSTAT) 0.4 MG SL tablet Place 1 tablet (0.4 mg total) under the tongue every 5 (five) minutes as needed for chest pain. 60 tablet 3  . polyethylene glycol (MIRALAX / GLYCOLAX) packet 17grams in 16 oz of water twice a day until bowel movement.  LAXITIVE.  Restart if two days since last bowel movement 14 each 0  . potassium chloride (K-DUR,KLOR-CON) 10 MEQ tablet Take 10 mEq by mouth 2 (two) times daily.    Marland Kitchen ULTICARE MINI PEN NEEDLES 31G X 6 MM MISC     . VENTOLIN HFA 108 (90 Base) MCG/ACT inhaler INHALE 2 PUFFS INTO THE LUNGS EVERY 6 HOURS AS NEEDED FOR WHEEZING ORSHORTNESS OF BREATH 18 g 6  . vitamin E 400 UNIT capsule Take 400 Units by mouth daily.    . bumetanide (BUMEX) 2 MG tablet Take 0.5 tablets (1 mg total) by mouth 2 (two) times daily as needed (weight gian/leg swelling).    . NEXIUM 40 MG capsule Take 1 capsule (40 mg total) by mouth 2 (two) times daily. 60 capsule 11   No facility-administered medications prior to visit.      Per HPI unless specifically indicated in ROS section below Review of Systems     Objective:    BP 132/66 (BP Location: Right Arm, Patient Position: Sitting, Cuff Size: Large)   Pulse 73   Temp 97.8 F (36.6 C) (Oral)   Ht 5' 10"  (1.778 m)   Wt (!) 309 lb (140.2 kg)   SpO2 96%   BMI 44.34 kg/m   Wt Readings from Last 3 Encounters:  08/21/17 (!) 309 lb (140.2 kg)  07/10/17 (!) 307 lb (139.3 kg)  07/01/17 297 lb 3.2 oz (134.8 kg)    Physical Exam  Constitutional: He appears well-developed and well-nourished. No distress.  HENT:  Mouth/Throat: Oropharynx is clear and moist. No oropharyngeal exudate.  Cardiovascular: Normal rate, regular rhythm and normal heart sounds.  No murmur  heard. Pulmonary/Chest: Effort normal and breath sounds normal. No respiratory distress. He has no wheezes. He has no rales.  Musculoskeletal: He exhibits no edema.  Psychiatric: He has a normal mood and affect.  Nursing note and vitals reviewed.  Lab Results  Component Value Date   CREATININE 1.03 07/01/2017   BUN 17 07/01/2017   NA 138 07/01/2017   K 3.1 (L) 07/01/2017   CL 95 (L) 07/01/2017   CO2 30 07/01/2017       Assessment &  Plan:   Problem List Items Addressed This Visit    Type 2 diabetes mellitus, uncontrolled, with retinopathy ([place]) - Primary    Chronic. Deteriorated control reviewed slow titration of basaglar up to 70 u daily. RTC 1 mo f/u visit.       Tinea corporis    Presumed, vs nummular eczema. Not improved despite daily clotrimazole x 2 months. Will Rx lotrisone.       Relevant Medications   clotrimazole-betamethasone (LOTRISONE) cream   Obesity, Class III, BMI 40-49.9 (morbid obesity) (HCC) (Chronic)    Deterioration noted over last month. Continue to encourage activity as tolerated (very limited due to cardiac conditions)      Diabetic neuropathy associated with type 2 diabetes mellitus (HCC)   Diabetic gastroparesis (HCC)   Coronary artery disease, non-occlusive (Chronic)   Relevant Medications   bumetanide (BUMEX) 2 MG tablet   Chronic diastolic CHF (congestive heart failure) ([place])    Has doubled bumex over weekend without much improvement or weight loss. Pending call from [facility] for diuretic dosing. Seems euvolemic today however weight gain noted. Will likely need metolazone.       Relevant Medications   bumetanide (BUMEX) 2 MG tablet   Chest pain    Thought microvascular ischemia. Has f/u with cards scheduled for tomorrow.           Meds ordered this encounter  Medications  . clotrimazole-betamethasone (LOTRISONE) cream    Sig: Apply 1 application topically 2 (two) times daily.    Dispense:  30 g    Refill:  0   No orders of the  defined types were placed in this encounter.   Follow up plan: Return in about 1 month (around 09/18/2017) for follow up visit.  [name], MD

## 2017-08-21 NOTE — Patient Instructions (Addendum)
Use combination cream sent to pharmacy (lotrisone) for skin rash.  Increase basaglar by 1 unit every 2 days if averaging >130 fasting reading. Check a few readings in the afternoon, jot down, bring me log in 2 weeks. Max basaglar will be 75 units.  Return in 1 month for follow up visit.

## 2017-08-21 NOTE — Assessment & Plan Note (Addendum)
Thought microvascular ischemia. Has f/u with cards scheduled for tomorrow.

## 2017-08-21 NOTE — Assessment & Plan Note (Signed)
Presumed, vs nummular eczema. Not improved despite daily clotrimazole x 2 months. Will Rx lotrisone.

## 2017-08-21 NOTE — Assessment & Plan Note (Signed)
Chronic. Deteriorated control reviewed slow titration of basaglar up to 70 u daily. RTC 1 mo f/u visit.

## 2017-08-21 NOTE — Assessment & Plan Note (Addendum)
Has doubled bumex over weekend without much improvement or weight loss. Pending call from [facility] for diuretic dosing. Seems euvolemic today however weight gain noted. Will likely need metolazone.

## 2017-08-21 NOTE — Assessment & Plan Note (Signed)
Deterioration noted over last month. Continue to encourage activity as tolerated (very limited due to cardiac conditions)

## 2017-08-22 DIAGNOSIS — Z9884 Bariatric surgery status: Secondary | ICD-10-CM | POA: Diagnosis not present

## 2017-08-22 DIAGNOSIS — R079 Chest pain, unspecified: Secondary | ICD-10-CM | POA: Diagnosis not present

## 2017-08-22 DIAGNOSIS — I110 Hypertensive heart disease with heart failure: Secondary | ICD-10-CM | POA: Diagnosis not present

## 2017-08-22 DIAGNOSIS — I2510 Atherosclerotic heart disease of native coronary artery without angina pectoris: Secondary | ICD-10-CM | POA: Diagnosis not present

## 2017-08-22 DIAGNOSIS — K219 Gastro-esophageal reflux disease without esophagitis: Secondary | ICD-10-CM | POA: Diagnosis not present

## 2017-08-22 DIAGNOSIS — I5030 Unspecified diastolic (congestive) heart failure: Secondary | ICD-10-CM | POA: Diagnosis not present

## 2017-08-22 DIAGNOSIS — E039 Hypothyroidism, unspecified: Secondary | ICD-10-CM | POA: Diagnosis not present

## 2017-08-22 DIAGNOSIS — Z96659 Presence of unspecified artificial knee joint: Secondary | ICD-10-CM | POA: Diagnosis not present

## 2017-08-22 DIAGNOSIS — I5033 Acute on chronic diastolic (congestive) heart failure: Secondary | ICD-10-CM | POA: Diagnosis not present

## 2017-08-22 DIAGNOSIS — E1151 Type 2 diabetes mellitus with diabetic peripheral angiopathy without gangrene: Secondary | ICD-10-CM | POA: Diagnosis not present

## 2017-08-22 DIAGNOSIS — Z794 Long term (current) use of insulin: Secondary | ICD-10-CM | POA: Diagnosis not present

## 2017-08-22 DIAGNOSIS — Z9049 Acquired absence of other specified parts of digestive tract: Secondary | ICD-10-CM | POA: Diagnosis not present

## 2017-08-28 DIAGNOSIS — Z794 Long term (current) use of insulin: Secondary | ICD-10-CM | POA: Diagnosis not present

## 2017-08-28 DIAGNOSIS — E875 Hyperkalemia: Secondary | ICD-10-CM | POA: Diagnosis not present

## 2017-08-28 DIAGNOSIS — I5030 Unspecified diastolic (congestive) heart failure: Secondary | ICD-10-CM | POA: Diagnosis not present

## 2017-08-28 DIAGNOSIS — E1151 Type 2 diabetes mellitus with diabetic peripheral angiopathy without gangrene: Secondary | ICD-10-CM | POA: Diagnosis not present

## 2017-08-28 DIAGNOSIS — I2510 Atherosclerotic heart disease of native coronary artery without angina pectoris: Secondary | ICD-10-CM | POA: Diagnosis not present

## 2017-08-28 DIAGNOSIS — Z9884 Bariatric surgery status: Secondary | ICD-10-CM | POA: Diagnosis not present

## 2017-08-28 DIAGNOSIS — E039 Hypothyroidism, unspecified: Secondary | ICD-10-CM | POA: Diagnosis not present

## 2017-08-28 DIAGNOSIS — R079 Chest pain, unspecified: Secondary | ICD-10-CM | POA: Diagnosis not present

## 2017-08-28 DIAGNOSIS — Z96659 Presence of unspecified artificial knee joint: Secondary | ICD-10-CM | POA: Diagnosis not present

## 2017-08-28 DIAGNOSIS — Z9049 Acquired absence of other specified parts of digestive tract: Secondary | ICD-10-CM | POA: Diagnosis not present

## 2017-08-28 DIAGNOSIS — I110 Hypertensive heart disease with heart failure: Secondary | ICD-10-CM | POA: Diagnosis not present

## 2017-08-28 DIAGNOSIS — I5033 Acute on chronic diastolic (congestive) heart failure: Secondary | ICD-10-CM | POA: Diagnosis not present

## 2017-09-02 DIAGNOSIS — I25118 Atherosclerotic heart disease of native coronary artery with other forms of angina pectoris: Secondary | ICD-10-CM | POA: Diagnosis not present

## 2017-09-02 DIAGNOSIS — I110 Hypertensive heart disease with heart failure: Secondary | ICD-10-CM | POA: Diagnosis not present

## 2017-09-02 DIAGNOSIS — R42 Dizziness and giddiness: Secondary | ICD-10-CM | POA: Diagnosis not present

## 2017-09-02 DIAGNOSIS — R0602 Shortness of breath: Secondary | ICD-10-CM | POA: Diagnosis not present

## 2017-09-02 DIAGNOSIS — I4510 Unspecified right bundle-branch block: Secondary | ICD-10-CM | POA: Diagnosis not present

## 2017-09-02 DIAGNOSIS — Z6841 Body Mass Index (BMI) 40.0 and over, adult: Secondary | ICD-10-CM | POA: Diagnosis not present

## 2017-09-02 DIAGNOSIS — I5033 Acute on chronic diastolic (congestive) heart failure: Secondary | ICD-10-CM | POA: Diagnosis not present

## 2017-09-02 DIAGNOSIS — R531 Weakness: Secondary | ICD-10-CM | POA: Diagnosis not present

## 2017-09-02 DIAGNOSIS — I200 Unstable angina: Secondary | ICD-10-CM | POA: Diagnosis not present

## 2017-09-02 DIAGNOSIS — R0789 Other chest pain: Secondary | ICD-10-CM | POA: Diagnosis not present

## 2017-09-02 DIAGNOSIS — E119 Type 2 diabetes mellitus without complications: Secondary | ICD-10-CM | POA: Diagnosis not present

## 2017-09-02 DIAGNOSIS — R079 Chest pain, unspecified: Secondary | ICD-10-CM | POA: Diagnosis not present

## 2017-09-03 DIAGNOSIS — E785 Hyperlipidemia, unspecified: Secondary | ICD-10-CM | POA: Diagnosis present

## 2017-09-03 DIAGNOSIS — I110 Hypertensive heart disease with heart failure: Secondary | ICD-10-CM | POA: Diagnosis not present

## 2017-09-03 DIAGNOSIS — E039 Hypothyroidism, unspecified: Secondary | ICD-10-CM | POA: Diagnosis present

## 2017-09-03 DIAGNOSIS — Z716 Tobacco abuse counseling: Secondary | ICD-10-CM | POA: Diagnosis not present

## 2017-09-03 DIAGNOSIS — R079 Chest pain, unspecified: Secondary | ICD-10-CM | POA: Diagnosis not present

## 2017-09-03 DIAGNOSIS — I25118 Atherosclerotic heart disease of native coronary artery with other forms of angina pectoris: Secondary | ICD-10-CM | POA: Diagnosis present

## 2017-09-03 DIAGNOSIS — I2510 Atherosclerotic heart disease of native coronary artery without angina pectoris: Secondary | ICD-10-CM | POA: Diagnosis not present

## 2017-09-03 DIAGNOSIS — F329 Major depressive disorder, single episode, unspecified: Secondary | ICD-10-CM | POA: Diagnosis present

## 2017-09-03 DIAGNOSIS — Z955 Presence of coronary angioplasty implant and graft: Secondary | ICD-10-CM | POA: Diagnosis not present

## 2017-09-03 DIAGNOSIS — G4733 Obstructive sleep apnea (adult) (pediatric): Secondary | ICD-10-CM | POA: Diagnosis present

## 2017-09-03 DIAGNOSIS — Z6841 Body Mass Index (BMI) 40.0 and over, adult: Secondary | ICD-10-CM | POA: Diagnosis not present

## 2017-09-03 DIAGNOSIS — I208 Other forms of angina pectoris: Secondary | ICD-10-CM | POA: Diagnosis not present

## 2017-09-03 DIAGNOSIS — H9193 Unspecified hearing loss, bilateral: Secondary | ICD-10-CM | POA: Diagnosis present

## 2017-09-03 DIAGNOSIS — Z7982 Long term (current) use of aspirin: Secondary | ICD-10-CM | POA: Diagnosis not present

## 2017-09-03 DIAGNOSIS — I5033 Acute on chronic diastolic (congestive) heart failure: Secondary | ICD-10-CM | POA: Diagnosis not present

## 2017-09-03 DIAGNOSIS — Z9049 Acquired absence of other specified parts of digestive tract: Secondary | ICD-10-CM | POA: Diagnosis not present

## 2017-09-03 DIAGNOSIS — K219 Gastro-esophageal reflux disease without esophagitis: Secondary | ICD-10-CM | POA: Diagnosis present

## 2017-09-03 DIAGNOSIS — E119 Type 2 diabetes mellitus without complications: Secondary | ICD-10-CM | POA: Diagnosis not present

## 2017-09-03 DIAGNOSIS — K7581 Nonalcoholic steatohepatitis (NASH): Secondary | ICD-10-CM | POA: Diagnosis present

## 2017-09-03 DIAGNOSIS — Z9884 Bariatric surgery status: Secondary | ICD-10-CM | POA: Diagnosis not present

## 2017-09-03 DIAGNOSIS — Z96659 Presence of unspecified artificial knee joint: Secondary | ICD-10-CM | POA: Diagnosis present

## 2017-09-03 DIAGNOSIS — Z794 Long term (current) use of insulin: Secondary | ICD-10-CM | POA: Diagnosis not present

## 2017-09-03 DIAGNOSIS — I493 Ventricular premature depolarization: Secondary | ICD-10-CM | POA: Diagnosis present

## 2017-09-03 DIAGNOSIS — K7469 Other cirrhosis of liver: Secondary | ICD-10-CM | POA: Diagnosis present

## 2017-09-03 DIAGNOSIS — E1165 Type 2 diabetes mellitus with hyperglycemia: Secondary | ICD-10-CM | POA: Diagnosis not present

## 2017-09-03 DIAGNOSIS — I440 Atrioventricular block, first degree: Secondary | ICD-10-CM | POA: Diagnosis present

## 2017-09-06 MED ORDER — INSULIN GLARGINE 100 UNIT/ML ~~LOC~~ SOLN
65.00 | SUBCUTANEOUS | Status: DC
Start: 2017-09-06 — End: 2017-09-06

## 2017-09-06 MED ORDER — BUMETANIDE 1 MG PO TABS
2.00 | ORAL_TABLET | ORAL | Status: DC
Start: 2017-09-06 — End: 2017-09-06

## 2017-09-06 MED ORDER — DEXTROSE 50 % IV SOLN
12.50 | INTRAVENOUS | Status: DC
Start: ? — End: 2017-09-06

## 2017-09-06 MED ORDER — POLYETHYLENE GLYCOL 3350 17 G PO PACK
17.00 | PACK | ORAL | Status: DC
Start: 2017-09-06 — End: 2017-09-06

## 2017-09-06 MED ORDER — SPIRONOLACTONE 25 MG PO TABS
12.50 | ORAL_TABLET | ORAL | Status: DC
Start: 2017-09-07 — End: 2017-09-06

## 2017-09-06 MED ORDER — PANTOPRAZOLE SODIUM 40 MG PO TBEC
40.00 | DELAYED_RELEASE_TABLET | ORAL | Status: DC
Start: 2017-09-07 — End: 2017-09-06

## 2017-09-06 MED ORDER — VITAMIN E 45 MG (100 UNIT) PO CAPS
400.00 | ORAL_CAPSULE | ORAL | Status: DC
Start: 2017-09-07 — End: 2017-09-06

## 2017-09-06 MED ORDER — METOCLOPRAMIDE HCL 10 MG PO TABS
20.00 | ORAL_TABLET | ORAL | Status: DC
Start: 2017-09-06 — End: 2017-09-06

## 2017-09-06 MED ORDER — CITALOPRAM HYDROBROMIDE 20 MG PO TABS
20.00 | ORAL_TABLET | ORAL | Status: DC
Start: 2017-09-07 — End: 2017-09-06

## 2017-09-06 MED ORDER — ASPIRIN 81 MG PO CHEW
81.00 | CHEWABLE_TABLET | ORAL | Status: DC
Start: 2017-09-07 — End: 2017-09-06

## 2017-09-06 MED ORDER — NITROGLYCERIN 0.4 MG SL SUBL
0.40 | SUBLINGUAL_TABLET | SUBLINGUAL | Status: DC
Start: ? — End: 2017-09-06

## 2017-09-06 MED ORDER — OXYCODONE HCL 5 MG PO TABS
5.00 | ORAL_TABLET | ORAL | Status: DC
Start: ? — End: 2017-09-06

## 2017-09-06 MED ORDER — GENERIC EXTERNAL MEDICATION
180.00 | Status: DC
Start: 2017-09-07 — End: 2017-09-06

## 2017-09-06 MED ORDER — ENOXAPARIN SODIUM 40 MG/0.4ML ~~LOC~~ SOLN
40.00 | SUBCUTANEOUS | Status: DC
Start: 2017-09-07 — End: 2017-09-06

## 2017-09-06 MED ORDER — CLOPIDOGREL BISULFATE 75 MG PO TABS
75.00 | ORAL_TABLET | ORAL | Status: DC
Start: 2017-09-07 — End: 2017-09-06

## 2017-09-06 MED ORDER — THERA-M PO TABS
1.00 | ORAL_TABLET | ORAL | Status: DC
Start: 2017-09-07 — End: 2017-09-06

## 2017-09-06 MED ORDER — ALBUTEROL SULFATE HFA 108 (90 BASE) MCG/ACT IN AERS
2.00 | INHALATION_SPRAY | RESPIRATORY_TRACT | Status: DC
Start: ? — End: 2017-09-06

## 2017-09-06 MED ORDER — AMLODIPINE BESYLATE 5 MG PO TABS
5.00 | ORAL_TABLET | ORAL | Status: DC
Start: 2017-09-07 — End: 2017-09-06

## 2017-09-06 MED ORDER — ACETAMINOPHEN 325 MG PO TABS
650.00 | ORAL_TABLET | ORAL | Status: DC
Start: ? — End: 2017-09-06

## 2017-09-06 MED ORDER — LISINOPRIL 20 MG PO TABS
20.00 | ORAL_TABLET | ORAL | Status: DC
Start: 2017-09-07 — End: 2017-09-06

## 2017-09-06 MED ORDER — METOPROLOL SUCCINATE ER 50 MG PO TB24
50.00 | ORAL_TABLET | ORAL | Status: DC
Start: 2017-09-07 — End: 2017-09-06

## 2017-09-06 MED ORDER — MAGNESIUM OXIDE 400 MG PO TABS
400.00 | ORAL_TABLET | ORAL | Status: DC
Start: 2017-09-07 — End: 2017-09-06

## 2017-09-06 MED ORDER — INSULIN LISPRO 100 UNIT/ML ~~LOC~~ SOLN
.00 | SUBCUTANEOUS | Status: DC
Start: 2017-09-06 — End: 2017-09-06

## 2017-09-06 MED ORDER — ATORVASTATIN CALCIUM 20 MG PO TABS
20.00 | ORAL_TABLET | ORAL | Status: DC
Start: 2017-09-06 — End: 2017-09-06

## 2017-09-06 MED ORDER — GENERIC EXTERNAL MEDICATION
125.00 | Status: DC
Start: 2017-09-07 — End: 2017-09-06

## 2017-09-08 DIAGNOSIS — H43813 Vitreous degeneration, bilateral: Secondary | ICD-10-CM | POA: Diagnosis not present

## 2017-09-08 DIAGNOSIS — E113291 Type 2 diabetes mellitus with mild nonproliferative diabetic retinopathy without macular edema, right eye: Secondary | ICD-10-CM | POA: Diagnosis not present

## 2017-09-08 DIAGNOSIS — H34812 Central retinal vein occlusion, left eye, with macular edema: Secondary | ICD-10-CM | POA: Diagnosis not present

## 2017-09-08 DIAGNOSIS — H2511 Age-related nuclear cataract, right eye: Secondary | ICD-10-CM | POA: Diagnosis not present

## 2017-09-17 ENCOUNTER — Other Ambulatory Visit: Payer: Self-pay | Admitting: Family Medicine

## 2017-09-21 ENCOUNTER — Encounter: Payer: Self-pay | Admitting: Family Medicine

## 2017-09-21 ENCOUNTER — Ambulatory Visit (INDEPENDENT_AMBULATORY_CARE_PROVIDER_SITE_OTHER): Payer: Medicare Other | Admitting: Family Medicine

## 2017-09-21 VITALS — BP 122/62 | HR 62 | Temp 97.9°F | Ht 70.0 in | Wt 309.5 lb

## 2017-09-21 DIAGNOSIS — I5032 Chronic diastolic (congestive) heart failure: Secondary | ICD-10-CM

## 2017-09-21 DIAGNOSIS — E039 Hypothyroidism, unspecified: Secondary | ICD-10-CM

## 2017-09-21 DIAGNOSIS — I2510 Atherosclerotic heart disease of native coronary artery without angina pectoris: Secondary | ICD-10-CM

## 2017-09-21 DIAGNOSIS — R079 Chest pain, unspecified: Secondary | ICD-10-CM | POA: Diagnosis not present

## 2017-09-21 DIAGNOSIS — I10 Essential (primary) hypertension: Secondary | ICD-10-CM | POA: Diagnosis not present

## 2017-09-21 MED ORDER — LEVOTHYROXINE SODIUM 125 MCG PO TABS: 125.0000 ug | ORAL_TABLET | Freq: Every day | ORAL | 3 refills | Status: DC

## 2017-09-21 NOTE — Assessment & Plan Note (Signed)
Reviewed latest records. Bumex diuretic dosing per [facility] instructions. On low dose spironolactone and high dose imdur. Sees advanced diuresis clinic at [facility].

## 2017-09-21 NOTE — Progress Notes (Signed)
BP 122/62 (BP Location: Left Arm, Patient Position: Sitting, Cuff Size: Large)   Pulse 62   Temp 97.9 F (36.6 C) (Oral)   Ht 5' 10"  (1.778 m)   Wt (!) 309 lb 8 oz (140.4 kg)   SpO2 95%   BMI 44.41 kg/m    CC: 1 mo f/u visit Subjective:    Patient ID: Chad [name], male    DOB: 01/22/1956, 62 y.o.   MRN: [id number]  HPI: Chad [name] is a 62 y.o. male presenting on 09/21/2017 for 1 mo follow up   Wife is staying with her mother in [place] - not doing well.   Recently hospitalized 7/13-17/2019 at [facility] for recurrent chest pain, records reviewed. Attributed to diastolic CHF with fluid overload despite cardiomems, treated with IV lasix diuresis. imdur increased to 172m daily. TSH was slightly elevated, synthroid was increased to 1267m daily. rec rpt TSH late August.   Has been having some chest discomfort and dyspnea worse yesterday - eased off with nitroglycerin. Today feeling better. He has been using his walker. He had one episode of orthostatic syncope since he's been home.  Has cards f/u scheduled for next month.  Relevant past medical, surgical, family and social history reviewed and updated as indicated. Interim medical history since our last visit reviewed. Allergies and medications reviewed and updated. Outpatient Medications Prior to Visit  Medication Sig Dispense Refill  . amLODipine (NORVASC) 5 MG tablet Take 1 tablet (5 mg total) by mouth daily.    . Marland Kitchenspirin 81 MG chewable tablet Chew 81 mg by mouth daily.     . Marland Kitchentorvastatin (LIPITOR) 20 MG tablet Take 1 tablet (20 mg total) by mouth daily.    . bumetanide (BUMEX) 2 MG tablet Take 1 tablet (2 mg total) by mouth 2 (two) times daily as needed (weight gain/leg swelling).    . citalopram (CELEXA) 20 MG tablet TAKE 1 TABLET BY MOUTH ONCE DAILY 30 tablet 1  . clopidogrel (PLAVIX) 75 MG tablet Take 1 tablet (75 mg total) by mouth daily.    . clotrimazole-betamethasone (LOTRISONE) cream Apply 1 application topically 2  (two) times daily. 30 g 0  . dexlansoprazole (DEXILANT) 60 MG capsule Take 1 capsule (60 mg total) by mouth daily. 30 capsule 3  . diclofenac sodium (VOLTAREN) 1 % GEL Apply 1 application topically 3 (three) times daily. 1 Tube 1  . Docusate Sodium (DSS) 100 MG CAPS 1 tab 2 times a day while on narcotics.  STOOL SOFTENER 60 each 0  . fluticasone (FLONASE) 50 MCG/ACT nasal spray PLACE 2 SPRAYS INTO BOTH NOSTRILS DAILY 16 g 2  . glimepiride (AMARYL) 2 MG tablet Take 1 tablet (2 mg total) by mouth daily as needed (hyperglycemia).    . Insulin Glargine (BASAGLAR KWIKPEN) 100 UNIT/ML SOPN Inject 0.5 mLs (50 Units total) into the skin at bedtime. (Patient taking differently: Inject 50 Units into the skin at bedtime. Injecting 55 units daily) 15 mL 5  . Insulin Pen Needle (ULTICARE MINI PEN NEEDLES) 31G X 6 MM MISC USE AS DIRECTED FOR LANTUS SOLOSTAR PEN 100 each 3  . isosorbide mononitrate (IMDUR) 60 MG 24 hr tablet Take 180 mg by mouth 3 (three) times daily.   11  . lisinopril (PRINIVIL,ZESTRIL) 20 MG tablet Take 1 tablet (20 mg total) by mouth daily. 90 tablet 3  . metFORMIN (GLUCOPHAGE) 1000 MG tablet TAKE 1 TABLET BY MOUTH TWICE A DAY WITH A MEAL 180 tablet 1  . metoCLOPramide (REGLAN)  10 MG tablet TAKE 1 TABLET BY MOUTH 3 TIMES DAILY BEFORE MEALS 90 tablet 6  . metolazone (ZAROXOLYN) 2.5 MG tablet Take 1 tablet (2.5 mg total) by mouth daily as needed (for more than 3lb/day or 5lb/wk weight gain).    . metoprolol succinate (TOPROL-XL) 50 MG 24 hr tablet Take 50 mg daily by mouth. Take with or immediately following a meal.    . Multiple Vitamin (MULITIVITAMIN WITH MINERALS) TABS Take 1 tablet by mouth daily.    . nitroGLYCERIN (NITROSTAT) 0.4 MG SL tablet Place 1 tablet (0.4 mg total) under the tongue every 5 (five) minutes as needed for chest pain. 60 tablet 3  . polyethylene glycol (MIRALAX / GLYCOLAX) packet 17grams in 16 oz of water twice a day until bowel movement.  LAXITIVE.  Restart if two days  since last bowel movement 14 each 0  . potassium chloride (K-DUR,KLOR-CON) 10 MEQ tablet Take 10 mEq by mouth 2 (two) times daily.    Marland Kitchen spironolactone (ALDACTONE) 25 MG tablet Take 0.5 tablets by mouth daily.    Marland Kitchen ULTICARE MINI PEN NEEDLES 31G X 6 MM MISC     . VENTOLIN HFA 108 (90 Base) MCG/ACT inhaler INHALE 2 PUFFS INTO THE LUNGS EVERY 6 HOURS AS NEEDED FOR WHEEZING ORSHORTNESS OF BREATH 18 g 6  . vitamin E 400 UNIT capsule Take 400 Units by mouth daily.    Marland Kitchen levothyroxine (SYNTHROID, LEVOTHROID) 112 MCG tablet Take 1 tablet (112 mcg total) by mouth daily. 90 tablet 3  . levothyroxine (SYNTHROID, LEVOTHROID) 125 MCG tablet Take 1 tablet (125 mcg total) by mouth daily. 30 tablet 3   No facility-administered medications prior to visit.      Per HPI unless specifically indicated in ROS section below Review of Systems     Objective:    BP 122/62 (BP Location: Left Arm, Patient Position: Sitting, Cuff Size: Large)   Pulse 62   Temp 97.9 F (36.6 C) (Oral)   Ht 5' 10"  (1.778 m)   Wt (!) 309 lb 8 oz (140.4 kg)   SpO2 95%   BMI 44.41 kg/m   Wt Readings from Last 3 Encounters:  09/21/17 (!) 309 lb 8 oz (140.4 kg)  08/21/17 (!) 309 lb (140.2 kg)  07/10/17 (!) 307 lb (139.3 kg)    Physical Exam  Constitutional: He appears well-developed and well-nourished. No distress.  HENT:  Mouth/Throat: Oropharynx is clear and moist. No oropharyngeal exudate.  Cardiovascular: Normal rate, regular rhythm and normal heart sounds.  No murmur heard. Pulmonary/Chest: Effort normal and breath sounds normal. No respiratory distress. He has no wheezes. He has no rales.  Musculoskeletal: He exhibits no edema.  Skin: Skin is warm and dry. No rash noted.  Nursing note and vitals reviewed.  Lab Results  Component Value Date   TSH 2.31 11/08/2016      Assessment & Plan:   Problem List Items Addressed This Visit    Hypothyroid    Chronic. Now on 168mg dosing of levothyroxine (changed during recent  hospitalization). Will recheck levels at the end of the month. I have refilled 1 more month supply to local pharmacy to last him until next TFTs       Relevant Medications   levothyroxine (SYNTHROID, LEVOTHROID) 125 MCG tablet   HTN (hypertension) (Chronic)    Chronic, stable. Continue current regimen.       Relevant Medications   spironolactone (ALDACTONE) 25 MG tablet   Chronic diastolic CHF (congestive heart failure) ([place] -  Primary    Reviewed latest records. Bumex diuretic dosing per [facility] instructions. On low dose spironolactone and high dose imdur. Sees advanced diuresis clinic at [facility].       Relevant Medications   spironolactone (ALDACTONE) 25 MG tablet   Chest pain    Thought manifestation of fluid overload in setting of dCHF, has had extensive CAD evaluation. Today stable.           Meds ordered this encounter  Medications  . levothyroxine (SYNTHROID, LEVOTHROID) 125 MCG tablet    Sig: Take 1 tablet (125 mcg total) by mouth daily.    Dispense:  30 tablet    Refill:  3   No orders of the defined types were placed in this encounter.   Follow up plan: Return in about 1 month (around 10/19/2017) for follow up visit.  [name], MD

## 2017-09-21 NOTE — Assessment & Plan Note (Signed)
Chronic, stable. Continue current regimen. 

## 2017-09-21 NOTE — Assessment & Plan Note (Signed)
Chronic. Now on 138mg dosing of levothyroxine (changed during recent hospitalization). Will recheck levels at the end of the month. I have refilled 1 more month supply to local pharmacy to last him until next TFTs

## 2017-09-21 NOTE — Patient Instructions (Addendum)
Continue current medicines.  Return in 1 month - we will recheck blood work at that time. Continue current medicines, continue water pill dosing according to [facility] recommendations.

## 2017-09-21 NOTE — Assessment & Plan Note (Signed)
Thought manifestation of fluid overload in setting of dCHF, has had extensive CAD evaluation. Today stable.

## 2017-09-23 ENCOUNTER — Other Ambulatory Visit: Payer: Self-pay | Admitting: Family Medicine

## 2017-09-28 DIAGNOSIS — I5030 Unspecified diastolic (congestive) heart failure: Secondary | ICD-10-CM | POA: Diagnosis not present

## 2017-10-05 ENCOUNTER — Other Ambulatory Visit: Payer: Self-pay

## 2017-10-05 MED ORDER — BASAGLAR KWIKPEN 100 UNIT/ML ~~LOC~~ SOPN: 75.0000 [IU] | PEN_INJECTOR | Freq: Every day | SUBCUTANEOUS | 5 refills | Status: DC

## 2017-10-05 NOTE — Telephone Encounter (Signed)
Spoke with pt to confirm units per day.  States he injects 75 units daily.  Sent refill with current dose.

## 2017-10-09 ENCOUNTER — Other Ambulatory Visit: Payer: Self-pay | Admitting: Family Medicine

## 2017-10-24 ENCOUNTER — Other Ambulatory Visit: Payer: Self-pay | Admitting: Family Medicine

## 2017-10-26 ENCOUNTER — Ambulatory Visit (INDEPENDENT_AMBULATORY_CARE_PROVIDER_SITE_OTHER): Payer: Medicare Other | Admitting: Family Medicine

## 2017-10-26 ENCOUNTER — Encounter: Payer: Self-pay | Admitting: Family Medicine

## 2017-10-26 VITALS — BP 120/68 | HR 62 | Temp 97.9°F | Ht 70.0 in | Wt 313.2 lb

## 2017-10-26 DIAGNOSIS — E782 Mixed hyperlipidemia: Secondary | ICD-10-CM

## 2017-10-26 DIAGNOSIS — IMO0002 Reserved for concepts with insufficient information to code with codable children: Secondary | ICD-10-CM

## 2017-10-26 DIAGNOSIS — E039 Hypothyroidism, unspecified: Secondary | ICD-10-CM | POA: Diagnosis not present

## 2017-10-26 DIAGNOSIS — E1165 Type 2 diabetes mellitus with hyperglycemia: Secondary | ICD-10-CM | POA: Diagnosis not present

## 2017-10-26 DIAGNOSIS — K7581 Nonalcoholic steatohepatitis (NASH): Secondary | ICD-10-CM | POA: Diagnosis not present

## 2017-10-26 DIAGNOSIS — I5032 Chronic diastolic (congestive) heart failure: Secondary | ICD-10-CM | POA: Diagnosis not present

## 2017-10-26 DIAGNOSIS — E11319 Type 2 diabetes mellitus with unspecified diabetic retinopathy without macular edema: Secondary | ICD-10-CM

## 2017-10-26 DIAGNOSIS — I2510 Atherosclerotic heart disease of native coronary artery without angina pectoris: Secondary | ICD-10-CM | POA: Diagnosis not present

## 2017-10-26 DIAGNOSIS — E1142 Type 2 diabetes mellitus with diabetic polyneuropathy: Secondary | ICD-10-CM | POA: Diagnosis not present

## 2017-10-26 DIAGNOSIS — L989 Disorder of the skin and subcutaneous tissue, unspecified: Secondary | ICD-10-CM | POA: Diagnosis not present

## 2017-10-26 DIAGNOSIS — Z23 Encounter for immunization: Secondary | ICD-10-CM

## 2017-10-26 DIAGNOSIS — K7460 Unspecified cirrhosis of liver: Secondary | ICD-10-CM | POA: Diagnosis not present

## 2017-10-26 LAB — TSH: TSH: 4.29 u[IU]/mL (ref 0.35–4.50)

## 2017-10-26 LAB — BASIC METABOLIC PANEL
BUN: 20 mg/dL (ref 6–23)
CALCIUM: 9.3 mg/dL (ref 8.4–10.5)
CO2: 29 meq/L (ref 19–32)
CREATININE: 1.02 mg/dL (ref 0.40–1.50)
Chloride: 97 mEq/L (ref 96–112)
GFR: 78.48 mL/min (ref >60.00)
GLUCOSE: 176 mg/dL — AB (ref 70–99)
Potassium: 3.6 mEq/L (ref 3.5–5.1)
Sodium: 136 mEq/L (ref 135–145)

## 2017-10-26 LAB — HEMOGLOBIN A1C: HEMOGLOBIN A1C: 7.8 % — AB (ref 4.6–6.5)

## 2017-10-26 LAB — LDL CHOLESTEROL, DIRECT: LDL DIRECT: 73 mg/dL

## 2017-10-26 MED ORDER — CITALOPRAM HYDROBROMIDE 20 MG PO TABS: 20.0000 mg | ORAL_TABLET | Freq: Every day | ORAL | 3 refills | Status: DC

## 2017-10-26 MED ORDER — BASAGLAR KWIKPEN 100 UNIT/ML ~~LOC~~ SOPN: 40.0000 [IU] | PEN_INJECTOR | Freq: Two times a day (BID) | SUBCUTANEOUS | 5 refills | Status: DC

## 2017-10-26 NOTE — Progress Notes (Addendum)
BP 120/68 (BP Location: Left Arm, Patient Position: Sitting, Cuff Size: Large)   Pulse 62   Temp 97.9 F (36.6 C) (Oral)   Ht 5' 10"  (1.778 m)   Wt (!) 313 lb 4 oz (142.1 kg)   SpO2 95%   BMI 44.95 kg/m    CC: 1 mo f/u visit Subjective:    Patient ID: Chad [name], male    DOB: June 08, 1955, 62 y.o.   MRN: [id number]  HPI: Chad [name] is a 62 y.o. male presenting on 10/26/2017 for 1 mo follow up   Due for rpt TSH after levothyroxine increased to 195mg dose.  [facility] Takes bumex 253mBID and as advised by diuresis clinic. Weight gain noted - planning to take 1.5 tablets at a time. Upcoming appt Tuesday.   Lesion L temple - present for 1 year, poorly healing.    He is fasting today  DM - does regularly check sugars and brings log - fasting 120-180. Compliant with antihyperglycemic regimen which includes: basaglar 80u at bedtime, metformin 100038mid. Denies low sugars or hypoglycemic symptoms. Denies paresthesias. Last diabetic eye exam 01/2017. Pneumovax: 2008, 2013. Prevnar: not due. Glucometer brand: one-touch. DSME: completed. Lab Results  Component Value Date   HGBA1C 7.2 (H) 06/30/2017   Diabetic Foot Exam - Simple   Simple Foot Form Visual Inspection See comments:  Yes Sensation Testing Intact to touch and monofilament testing bilaterally:  Yes Pulse Check Posterior Tibialis and Dorsalis pulse intact bilaterally:  Yes Comments 2+ DP bilaterally Dry skin, fissure L heel, pre ulcerative callus formation bilateral soles    Lab Results  Component Value Date   MICROALBUR <0.7 09/15/2015     Relevant past medical, surgical, family and social history reviewed and updated as indicated. Interim medical history since our last visit reviewed. Allergies and medications reviewed and updated. Outpatient Medications Prior to Visit  Medication Sig Dispense Refill  . amLODipine (NORVASC) 5 MG tablet Take 1 tablet (5 mg  total) by mouth daily.    . aMarland Kitchenpirin 81 MG chewable tablet Chew 81 mg by mouth daily.     . aMarland Kitchenorvastatin (LIPITOR) 20 MG tablet Take 1 tablet (20 mg total) by mouth daily.    . bumetanide (BUMEX) 2 MG tablet Take 1 tablet (2 mg total) by mouth 2 (two) times daily as needed (weight gain/leg swelling).    . clopidogrel (PLAVIX) 75 MG tablet Take 1 tablet (75 mg total) by mouth daily.    . clotrimazole-betamethasone (LOTRISONE) cream Apply 1 application topically 2 (two) times daily. 30 g 0  . DEXILANT 60 MG capsule TAKE 1 CAPSULE BY MOUTH ONCE DAILY 30 capsule 3  . diclofenac sodium (VOLTAREN) 1 % GEL Apply 1 application topically 3 (three) times daily. 1 Tube 1  . Docusate Sodium (DSS) 100 MG CAPS 1 tab 2 times a day while on narcotics.  STOOL SOFTENER 60 each 0  . fluticasone (FLONASE) 50 MCG/ACT nasal spray PLACE 2 SPRAYS INTO BOTH NOSTRILS DAILY 16 g 2  . glimepiride (AMARYL) 2 MG tablet Take 1 tablet (2 mg total) by mouth daily as needed (hyperglycemia).    . Insulin Pen Needle (ULTICARE MINI PEN NEEDLES) 31G X 6 MM MISC USE AS DIRECTED FOR LANTUS SOLOSTAR PEN 100 each 3  . isosorbide mononitrate (IMDUR) 60 MG 24 hr tablet Take 180 mg by mouth 3 (three) times daily.   11  . levothyroxine (SYNTHROID, LEVOTHROID) 125 MCG tablet  Take 1 tablet (125 mcg total) by mouth daily. 30 tablet 3  . lisinopril (PRINIVIL,ZESTRIL) 20 MG tablet Take 1 tablet (20 mg total) by mouth daily. 90 tablet 3  . metFORMIN (GLUCOPHAGE) 1000 MG tablet TAKE 1 TABLET BY MOUTH TWICE A DAY WITH A MEAL 180 tablet 1  . metoCLOPramide (REGLAN) 10 MG tablet TAKE 1 TABLET BY MOUTH 3 TIMES DAILY BEFORE MEALS 90 tablet 6  . metolazone (ZAROXOLYN) 2.5 MG tablet Take 1 tablet (2.5 mg total) by mouth daily as needed (for more than 3lb/day or 5lb/wk weight gain).    . metoprolol succinate (TOPROL-XL) 50 MG 24 hr tablet Take 50 mg daily by mouth. Take with or immediately following a meal.    . Multiple Vitamin (MULITIVITAMIN WITH MINERALS)  TABS Take 1 tablet by mouth daily.    . nitroGLYCERIN (NITROSTAT) 0.4 MG SL tablet Place 1 tablet (0.4 mg total) under the tongue every 5 (five) minutes as needed for chest pain. 60 tablet 3  . polyethylene glycol (MIRALAX / GLYCOLAX) packet 17grams in 16 oz of water twice a day until bowel movement.  LAXITIVE.  Restart if two days since last bowel movement 14 each 0  . potassium chloride (K-DUR,KLOR-CON) 10 MEQ tablet Take 10 mEq by mouth 2 (two) times daily.    Marland Kitchen spironolactone (ALDACTONE) 25 MG tablet Take 0.5 tablets by mouth daily.    Marland Kitchen ULTICARE MINI PEN NEEDLES 31G X 6 MM MISC     . VENTOLIN HFA 108 (90 Base) MCG/ACT inhaler INHALE 2 PUFFS INTO THE LUNGS EVERY 6 HOURS AS NEEDED FOR WHEEZING ORSHORTNESS OF BREATH 18 g 6  . vitamin E 400 UNIT capsule Take 400 Units by mouth daily.    . citalopram (CELEXA) 20 MG tablet TAKE 1 TABLET BY MOUTH ONCE DAILY 30 tablet 0  . Insulin Glargine (BASAGLAR KWIKPEN) 100 UNIT/ML SOPN Inject 0.75 mLs (75 Units total) into the skin at bedtime. 15 mL 5   No facility-administered medications prior to visit.      Per HPI unless specifically indicated in ROS section below Review of Systems     Objective:    BP 120/68 (BP Location: Left Arm, Patient Position: Sitting, Cuff Size: Large)   Pulse 62   Temp 97.9 F (36.6 C) (Oral)   Ht 5' 10"  (1.778 m)   Wt (!) 313 lb 4 oz (142.1 kg)   SpO2 95%   BMI 44.95 kg/m   Wt Readings from Last 3 Encounters:  10/26/17 (!) 313 lb 4 oz (142.1 kg)  09/21/17 (!) 309 lb 8 oz (140.4 kg)  08/21/17 (!) 309 lb (140.2 kg)    Physical Exam  Constitutional: He appears well-developed and well-nourished. No distress.  HENT:  Head: Normocephalic and atraumatic.    Right Ear: External ear normal.  Left Ear: External ear normal.  Nose: Nose normal.  Mouth/Throat: Oropharynx is clear and moist. No oropharyngeal exudate.  ~1cm poorly healing wound to L temple  Eyes: Pupils are equal, round, and reactive to light.  Conjunctivae and EOM are normal. No scleral icterus.  Neck: Normal range of motion. Neck supple.  Cardiovascular: Normal rate, regular rhythm, normal heart sounds and intact distal pulses.  No murmur heard. Pulmonary/Chest: Effort normal and breath sounds normal. No respiratory distress. He has no wheezes. He has no rales.  Musculoskeletal: He exhibits no edema.  See HPI for foot exam if done  Lymphadenopathy:    He has no cervical adenopathy.  Skin: Skin is warm  and dry. No rash noted.  Psychiatric: He has a normal mood and affect.  Nursing note and vitals reviewed.  Results for orders placed or performed during the hospital encounter of 52/77/82  Basic metabolic panel  Result Value Ref Range   Sodium 135 135 - 145 mmol/L   Potassium 5.4 (H) 3.5 - 5.1 mmol/L   Chloride 95 (L) 101 - 111 mmol/L   CO2 23 22 - 32 mmol/L   Glucose, Bld 192 (H) 65 - 99 mg/dL   BUN 20 6 - 20 mg/dL   Creatinine, Ser 1.15 0.61 - 1.24 mg/dL   Calcium 9.4 8.9 - 10.3 mg/dL   GFR calc non Af Amer >60 >60 mL/min   GFR calc Af Amer >60 >60 mL/min   Anion gap 17 (H) 5 - 15  CBC  Result Value Ref Range   WBC 6.4 4.0 - 10.5 K/uL   RBC 5.04 4.22 - 5.81 MIL/uL   Hemoglobin 13.9 13.0 - 17.0 g/dL   HCT 40.7 39.0 - 52.0 %   MCV 80.8 78.0 - 100.0 fL   MCH 27.6 26.0 - 34.0 pg   MCHC 34.2 30.0 - 36.0 g/dL   RDW 15.1 11.5 - 15.5 %   Platelets 161 150 - 400 K/uL  Brain natriuretic peptide  Result Value Ref Range   B Natriuretic Peptide 15.8 0.0 - 100.0 pg/mL  Troponin I  Result Value Ref Range   Troponin I <0.03 <0.03 ng/mL  Troponin I  Result Value Ref Range   Troponin I <0.03 <0.03 ng/mL  D-dimer, quantitative (not at [facility])  Result Value Ref Range   D-Dimer, Quant <0.27 0.00 - 0.50 ug/mL-FEU  Hemoglobin A1c  Result Value Ref Range   Hgb A1c MFr Bld 7.2 (H) 4.8 - 5.6 %   Mean Plasma Glucose 159.94 mg/dL  Basic metabolic panel  Result Value Ref Range   Sodium 138 135 - 145 mmol/L   Potassium 3.1 (L) 3.5  - 5.1 mmol/L   Chloride 95 (L) 101 - 111 mmol/L   CO2 30 22 - 32 mmol/L   Glucose, Bld 176 (H) 65 - 99 mg/dL   BUN 17 6 - 20 mg/dL   Creatinine, Ser 1.03 0.61 - 1.24 mg/dL   Calcium 9.0 8.9 - 10.3 mg/dL   GFR calc non Af Amer >60 >60 mL/min   GFR calc Af Amer >60 >60 mL/min   Anion gap 13 5 - 15  CBC  Result Value Ref Range   WBC 4.9 4.0 - 10.5 K/uL   RBC 4.59 4.22 - 5.81 MIL/uL   Hemoglobin 12.6 (L) 13.0 - 17.0 g/dL   HCT 38.1 (L) 39.0 - 52.0 %   MCV 83.0 78.0 - 100.0 fL   MCH 27.5 26.0 - 34.0 pg   MCHC 33.1 30.0 - 36.0 g/dL   RDW 15.2 11.5 - 15.5 %   Platelets 113 (L) 150 - 400 K/uL  Creatinine, serum  Result Value Ref Range   Creatinine, Ser 1.13 0.61 - 1.24 mg/dL   GFR calc non Af Amer >60 >60 mL/min   GFR calc Af Amer >60 >60 mL/min  Hepatic function panel  Result Value Ref Range   Total Protein 6.7 6.5 - 8.1 g/dL   Albumin 4.1 3.5 - 5.0 g/dL   AST 40 15 - 41 U/L   ALT 30 17 - 63 U/L   Alkaline Phosphatase 120 38 - 126 U/L   Total Bilirubin 1.7 (H) 0.3 - 1.2 mg/dL  Bilirubin, Direct 0.3 0.1 - 0.5 mg/dL   Indirect Bilirubin 1.4 (H) 0.3 - 0.9 mg/dL  Troponin I  Result Value Ref Range   Troponin I <0.03 <0.03 ng/mL  Glucose, capillary  Result Value Ref Range   Glucose-Capillary 162 (H) 65 - 99 mg/dL  Lipid panel  Result Value Ref Range   Cholesterol 100 0 - 200 mg/dL   Triglycerides 147 <150 mg/dL   HDL 25 (L) >40 mg/dL   Total CHOL/HDL Ratio 4.0 RATIO   VLDL 29 0 - 40 mg/dL   LDL Cholesterol 46 0 - 99 mg/dL  Glucose, capillary  Result Value Ref Range   Glucose-Capillary 172 (H) 65 - 99 mg/dL  Glucose, capillary  Result Value Ref Range   Glucose-Capillary 251 (H) 65 - 99 mg/dL  I-stat troponin, ED  Result Value Ref Range   Troponin i, poc 0.00 0.00 - 0.08 ng/mL   Comment 3          I-Stat Troponin, ED (not at [facility])  Result Value Ref Range   Troponin i, poc 0.01 0.00 - 0.08 ng/mL   Comment 3          CBG monitoring, ED  Result Value Ref Range    Glucose-Capillary 111 (H) 65 - 99 mg/dL      Assessment & Plan:   Problem List Items Addressed This Visit    Type 2 diabetes mellitus, uncontrolled, with retinopathy (HCC)    Chronic, uncontrolled. Update labs today.  Persistent fasting hyperglycemia.  Will split basaglar to 40u BID. I asked him to bring me sugar log in 2 wks to review. RTC 6wks f/u      Relevant Medications   Insulin Glargine (BASAGLAR KWIKPEN) 100 UNIT/ML SOPN   Other Relevant Orders   Basic metabolic panel   Hemoglobin A1c   LDL Cholesterol, Direct   Skin lesion - Primary    Longstanding poorly healing wound of L temple - refer to derm r/o neoplasm.      Relevant Orders   Ambulatory referral to Dermatology   Obesity, Class III, BMI 40-49.9 (morbid obesity) (HCC) (Chronic)   Relevant Medications   Insulin Glargine (BASAGLAR KWIKPEN) 100 UNIT/ML SOPN   Liver cirrhosis secondary to NASH (HCC) (Chronic)   Hypothyroid    Levothyroxine recently increased to 159mg - update TSH today, refill after results return.       Relevant Orders   TSH   HLD (hyperlipidemia)    Update dLDL      Diabetic neuropathy associated with type 2 diabetes mellitus ([place]    Benign exam today.       Relevant Medications   Insulin Glargine (BASAGLAR KWIKPEN) 100 UNIT/ML SOPN   Chronic diastolic CHF (congestive heart failure) (HCC)    Diuretic dosing through [facility] program.  Upcoming cards f/u next week.        Other Visit Diagnoses    Need for influenza vaccination       Relevant Orders   Flu Vaccine QUAD 36+ mos IM (Completed)       Meds ordered this encounter  Medications  . citalopram (CELEXA) 20 MG tablet    Sig: Take 1 tablet (20 mg total) by mouth daily.    Dispense:  90 tablet    Refill:  3  . Insulin Glargine (BASAGLAR KWIKPEN) 100 UNIT/ML SOPN    Sig: Inject 0.4 mLs (40 Units total) into the skin 2 (two) times daily.    Dispense:  15 mL    Refill:  5   Orders Placed This Encounter  Procedures  .  Flu Vaccine QUAD 36+ mos IM  . Basic metabolic panel  . Hemoglobin A1c  . TSH  . LDL Cholesterol, Direct  . Ambulatory referral to Dermatology    Referral Priority:   Routine    Referral Type:   Consultation    Referral Reason:   Specialty Services Required    Requested Specialty:   Dermatology    Number of Visits Requested:   1    Follow up plan: Return in about 6 weeks (around 12/07/2017) for follow up visit.  [name], MD

## 2017-10-26 NOTE — Assessment & Plan Note (Signed)
Benign exam today.

## 2017-10-26 NOTE — Patient Instructions (Addendum)
Flu shot today Labs today.  We will refer you to skin doctor for further evaluation of L temple region.  celexa refilled. Split basaglar into 2 shots a day - 40 units each time.  Bring in sugar log in 2 weeks, return in 6 weeks for follow up.

## 2017-10-26 NOTE — Assessment & Plan Note (Signed)
Update dLDL

## 2017-10-26 NOTE — Assessment & Plan Note (Addendum)
Longstanding poorly healing wound of L temple - refer to derm r/o neoplasm.

## 2017-10-26 NOTE — Assessment & Plan Note (Signed)
Chronic, uncontrolled. Update labs today.  Persistent fasting hyperglycemia.  Will split basaglar to 40u BID. I asked him to bring me sugar log in 2 wks to review. RTC 6wks f/u

## 2017-10-26 NOTE — Assessment & Plan Note (Addendum)
Levothyroxine recently increased to 174mg - update TSH today, refill after results return.

## 2017-10-26 NOTE — Assessment & Plan Note (Signed)
Diuretic dosing through [facility] program.  Upcoming cards f/u next week.

## 2017-10-31 DIAGNOSIS — I110 Hypertensive heart disease with heart failure: Secondary | ICD-10-CM | POA: Diagnosis not present

## 2017-10-31 DIAGNOSIS — E871 Hypo-osmolality and hyponatremia: Secondary | ICD-10-CM | POA: Diagnosis not present

## 2017-10-31 DIAGNOSIS — E119 Type 2 diabetes mellitus without complications: Secondary | ICD-10-CM | POA: Diagnosis not present

## 2017-10-31 DIAGNOSIS — R002 Palpitations: Secondary | ICD-10-CM | POA: Diagnosis not present

## 2017-10-31 DIAGNOSIS — Z7951 Long term (current) use of inhaled steroids: Secondary | ICD-10-CM | POA: Diagnosis not present

## 2017-10-31 DIAGNOSIS — Z7982 Long term (current) use of aspirin: Secondary | ICD-10-CM | POA: Diagnosis not present

## 2017-10-31 DIAGNOSIS — Z794 Long term (current) use of insulin: Secondary | ICD-10-CM | POA: Diagnosis not present

## 2017-10-31 DIAGNOSIS — Z79899 Other long term (current) drug therapy: Secondary | ICD-10-CM | POA: Diagnosis not present

## 2017-10-31 DIAGNOSIS — I25110 Atherosclerotic heart disease of native coronary artery with unstable angina pectoris: Secondary | ICD-10-CM | POA: Diagnosis not present

## 2017-10-31 DIAGNOSIS — I5030 Unspecified diastolic (congestive) heart failure: Secondary | ICD-10-CM | POA: Diagnosis not present

## 2017-10-31 DIAGNOSIS — E785 Hyperlipidemia, unspecified: Secondary | ICD-10-CM | POA: Diagnosis not present

## 2017-11-08 ENCOUNTER — Other Ambulatory Visit: Payer: Self-pay | Admitting: Family Medicine

## 2017-11-16 ENCOUNTER — Other Ambulatory Visit: Payer: Self-pay | Admitting: Family Medicine

## 2017-11-16 ENCOUNTER — Ambulatory Visit (INDEPENDENT_AMBULATORY_CARE_PROVIDER_SITE_OTHER): Payer: Medicare Other

## 2017-11-16 VITALS — BP 124/70 | HR 78 | Temp 97.6°F | Ht 70.0 in | Wt 307.5 lb

## 2017-11-16 DIAGNOSIS — Z125 Encounter for screening for malignant neoplasm of prostate: Secondary | ICD-10-CM

## 2017-11-16 DIAGNOSIS — E782 Mixed hyperlipidemia: Secondary | ICD-10-CM

## 2017-11-16 DIAGNOSIS — Z0000 Encounter for general adult medical examination without abnormal findings: Secondary | ICD-10-CM | POA: Diagnosis not present

## 2017-11-16 DIAGNOSIS — E039 Hypothyroidism, unspecified: Secondary | ICD-10-CM | POA: Diagnosis not present

## 2017-11-16 DIAGNOSIS — K7460 Unspecified cirrhosis of liver: Secondary | ICD-10-CM

## 2017-11-16 DIAGNOSIS — E11319 Type 2 diabetes mellitus with unspecified diabetic retinopathy without macular edema: Secondary | ICD-10-CM

## 2017-11-16 DIAGNOSIS — K7581 Nonalcoholic steatohepatitis (NASH): Secondary | ICD-10-CM

## 2017-11-16 DIAGNOSIS — E1165 Type 2 diabetes mellitus with hyperglycemia: Principal | ICD-10-CM

## 2017-11-16 DIAGNOSIS — IMO0002 Reserved for concepts with insufficient information to code with codable children: Secondary | ICD-10-CM

## 2017-11-16 LAB — HEPATIC FUNCTION PANEL
ALT: 38 U/L (ref 0–53)
AST: 42 U/L — ABNORMAL HIGH (ref 0–37)
Albumin: 4.3 g/dL (ref 3.5–5.2)
Alkaline Phosphatase: 163 U/L — ABNORMAL HIGH (ref 39–117)
Bilirubin, Direct: 0.3 mg/dL (ref 0.0–0.3)
TOTAL PROTEIN: 6.5 g/dL (ref 6.0–8.3)
Total Bilirubin: 1.2 mg/dL (ref 0.2–1.2)

## 2017-11-16 LAB — PSA, MEDICARE: PSA: 0.92 ng/mL (ref 0.10–4.00)

## 2017-11-16 LAB — LIPID PANEL
Cholesterol: 116 mg/dL (ref 0–200)
HDL: 36.4 mg/dL — AB (ref >39.00)
LDL CALC: 51 mg/dL (ref 0–99)
NONHDL: 79.97
Total CHOL/HDL Ratio: 3
Triglycerides: 145 mg/dL (ref 0.0–149.0)
VLDL: 29 mg/dL (ref 0.0–40.0)

## 2017-11-16 LAB — TSH: TSH: 2.45 u[IU]/mL (ref 0.35–4.50)

## 2017-11-16 NOTE — Progress Notes (Signed)
I reviewed health advisor's note, was available for consultation, and agree with documentation and plan.   Signed,  Alixander Rallis T. Namiyah Grantham, MD  

## 2017-11-16 NOTE — Patient Instructions (Signed)
Mr. Purdom , Thank you for taking time to come for your Medicare Wellness Visit. I appreciate your ongoing commitment to your health goals. Please review the following plan we discussed and let me know if I can assist you in the future.   These are the goals we discussed: Goals    . Increase physical activity     When weather permits, I will attempt to walk at least 1 mile 5 days per week.        This is a list of the screening recommended for you and due dates:  Health Maintenance  Topic Date Due  . DTaP/Tdap/Td vaccine (1 - Tdap) 01/30/2021*  . Eye exam for diabetics  02/10/2018  . Complete foot exam   04/10/2018  . Hemoglobin A1C  04/26/2018  . Colon Cancer Screening  08/30/2020  . Tetanus Vaccine  01/30/2021  . Flu Shot  Completed  . Pneumococcal vaccine  Completed  .  Hepatitis C: One time screening is recommended by Center for Disease Control  (CDC) for  adults born from [birth year] through 1965.   Completed  . HIV Screening  Completed  *Topic was postponed. The date shown is not the original due date.   Preventive Care for Adults  A healthy lifestyle and preventive care can promote health and wellness. Preventive health guidelines for adults include the following key practices.  . A routine yearly physical is a good way to check with your health care provider about your health and preventive screening. It is a chance to share any concerns and updates on your health and to receive a thorough exam.  . Visit your dentist for a routine exam and preventive care every 6 months. Brush your teeth twice a day and floss once a day. Good oral hygiene prevents tooth decay and gum disease.  . The frequency of eye exams is based on your age, health, family medical history, use  of contact lenses, and other factors. Follow your health care provider's recommendations for frequency of eye exams.  . Eat a healthy diet. Foods like vegetables, fruits, whole grains, low-fat dairy products, and lean  protein foods contain the nutrients you need without too many calories. Decrease your intake of foods high in solid fats, added sugars, and salt. Eat the right amount of calories for you. Get information about a proper diet from your health care provider, if necessary.  . Regular physical exercise is one of the most important things you can do for your health. Most adults should get at least 150 minutes of moderate-intensity exercise (any activity that increases your heart rate and causes you to sweat) each week. In addition, most adults need muscle-strengthening exercises on 2 or more days a week.  Silver Sneakers may be a benefit available to you. To determine eligibility, you may visit the website: www.silversneakers.com or contact program at 516-149-9580 Mon-Fri between 8AM-8PM.   . Maintain a healthy weight. The body mass index (BMI) is a screening tool to identify possible weight problems. It provides an estimate of body fat based on height and weight. Your health care provider can find your BMI and can help you achieve or maintain a healthy weight.   For adults 20 years and older: ? A BMI below 18.5 is considered underweight. ? A BMI of 18.5 to 24.9 is normal. ? A BMI of 25 to 29.9 is considered overweight. ? A BMI of 30 and above is considered obese.   . Maintain normal blood lipids and cholesterol  levels by exercising and minimizing your intake of saturated fat. Eat a balanced diet with plenty of fruit and vegetables. Blood tests for lipids and cholesterol should begin at age 77 and be repeated every 5 years. If your lipid or cholesterol levels are high, you are over 50, or you are at high risk for heart disease, you may need your cholesterol levels checked more frequently. Ongoing high lipid and cholesterol levels should be treated with medicines if diet and exercise are not working.  . If you smoke, find out from your health care provider how to quit. If you do not use tobacco, please  do not start.  . If you choose to drink alcohol, please do not consume more than 2 drinks per day. One drink is considered to be 12 ounces (355 mL) of beer, 5 ounces (148 mL) of wine, or 1.5 ounces (44 mL) of liquor.  . If you are 49-81 years old, ask your health care provider if you should take aspirin to prevent strokes.  . Use sunscreen. Apply sunscreen liberally and repeatedly throughout the day. You should seek shade when your shadow is shorter than you. Protect yourself by wearing long sleeves, pants, a wide-brimmed hat, and sunglasses year round, whenever you are outdoors.  . Once a month, do a whole body skin exam, using a mirror to look at the skin on your back. Tell your health care provider of new moles, moles that have irregular borders, moles that are larger than a pencil eraser, or moles that have changed in shape or color.
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
## 2017-11-16 NOTE — Progress Notes (Signed)
PCP notes:   Health maintenance:  No gaps identified.  Abnormal screenings:   Fall risk - hx of single fall Fall Risk  11/16/2017 05/15/2017 12/19/2016 11/08/2016 09/07/2016  Falls in the past year? Yes No No No No  Comment "passed out" in yard - - - -  Number falls in past yr: 1 - - - -  Injury with Fall? No - - - -  Comment - - - - -  Risk for fall due to : - - - - -  Risk for fall due to: Comment - - - - -   Mini-Cog score: 19/20 MMSE - Mini Mental State Exam 11/16/2017 11/08/2016  Orientation to time 5 5  Orientation to Place 5 5  Registration 3 3  Attention/ Calculation 0 0  Recall 2 2  Recall-comments unable to recall 1 of 3 words unable to recall 1 of 3 words  Language- name 2 objects 0 0  Language- repeat 1 1  Language- follow 3 step command 3 3  Language- read & follow direction 0 0  Write a sentence 0 0  Copy design 0 0  Total score 19 19    Patient concerns:   None  Nurse concerns:  None  Next PCP appt:   11/20/17 @ 1030

## 2017-11-17 DIAGNOSIS — C44329 Squamous cell carcinoma of skin of other parts of face: Secondary | ICD-10-CM | POA: Diagnosis not present

## 2017-11-17 DIAGNOSIS — B078 Other viral warts: Secondary | ICD-10-CM | POA: Diagnosis not present

## 2017-11-17 DIAGNOSIS — Z95811 Presence of heart assist device: Secondary | ICD-10-CM | POA: Diagnosis not present

## 2017-11-17 DIAGNOSIS — I5030 Unspecified diastolic (congestive) heart failure: Secondary | ICD-10-CM | POA: Diagnosis not present

## 2017-11-19 NOTE — Progress Notes (Signed)
BP 124/78 (BP Location: Left Arm, Patient Position: Sitting, Cuff Size: Large)   Pulse 68   Temp 97.8 F (36.6 C) (Oral)   Ht 5' 10"  (1.778 m)   Wt (!) 315 lb (142.9 kg)   SpO2 97%   BMI 45.20 kg/m    CC: AMW f/u visit Subjective:    Patient ID: Chad [name], male    DOB: Aug 26, 1955, 62 y.o.   MRN: [id number]  HPI: Chad [name] is a 62 y.o. male presenting on 11/20/2017 for Annual Exam (Pt 2.)   Saw Katha Cabal last week for medicare wellness visit. Note reviewed.   Doesn't feel well today - has rolling walker with seat - but doesn't use.   HFPEF - managed by [facility] heart failure clinic. On bumex 90m bid and metolazone 2.564mdaily followed by [facility]. Weight gain noted - awaiting phone call today from [facility] for titration.  CAD s/p PCI to mLAD 12/2016 for unstable angina. Latest catheterization 03/2017 showed patent mid LAD stent without other obstructive lesions.  Has re established with cardiac rehab.   Just had skin cancer removed from L temple as well as several warts treated.   DM - brings sugar log - fasting 120-200. Last visit we split basaglar to BID dosing 40 u. Also takes metformin 100067mid. amaryl was stopped 04/2017.  Lab Results  Component Value Date   HGBA1C 7.8 (H) 10/26/2017   Lab Results  Component Value Date   CREATININE 1.02 10/26/2017    Preventative: COLONOSCOPY Date: 12/2014 TAs, mod diverticulosis, rpt 3 yrs ([place]rt[place]) Prostate cancer screen yearly.  Flu shot yearly Td - 01/2011  Pneumovax 2008, 2013  Shingrix - discussed  Discussed advanced directives, doesn't think would want prolonged life support, but will think about this.Would want wife to be HCPOA. Packet previously provided.  Seat belt use discussed Sunscreen use discussed. Just saw dermatology Non smoker Alcohol - none Dentist - last seen a few years ago Eye exam yearly  Caffeine: 2 cups coffee Lives with wife, 2 dogs Occupation: Retired, used to hanHealth and safety inspectorck, on disability  for stomach and pain and severe GERD Activity: 1 mile a few times a week Diet: lots of water, good fruits/vegetables. Stays away from fried foods.  Relevant past medical, surgical, family and social history reviewed and updated as indicated. Interim medical history since our last visit reviewed. Allergies and medications reviewed and updated. Outpatient Medications Prior to Visit  Medication Sig Dispense Refill  . amLODipine (NORVASC) 5 MG tablet Take 1 tablet (5 mg total) by mouth daily.    . aMarland Kitchenpirin 81 MG chewable tablet Chew 81 mg by mouth daily.     . aMarland Kitchenorvastatin (LIPITOR) 20 MG tablet Take 1 tablet (20 mg total) by mouth daily.    . bumetanide (BUMEX) 2 MG tablet Take 1 tablet (2 mg total) by mouth 2 (two) times daily as needed (weight gain/leg swelling).    . citalopram (CELEXA) 20 MG tablet Take 1 tablet (20 mg total) by mouth daily. 90 tablet 3  . clopidogrel (PLAVIX) 75 MG tablet Take 1 tablet (75 mg total) by mouth daily.    . clotrimazole-betamethasone (LOTRISONE) cream Apply 1 application topically 2 (two) times daily. 30 g 0  . DEXILANT 60 MG capsule TAKE 1 CAPSULE BY MOUTH ONCE DAILY 30 capsule 3  . diclofenac sodium (VOLTAREN) 1 % GEL Apply 1 application topically 3 (three) times daily. 1 Tube 1  . Docusate Sodium (DSS) 100 MG CAPS 1 tab  2 times a day while on narcotics.  STOOL SOFTENER 60 each 0  . fluticasone (FLONASE) 50 MCG/ACT nasal spray PLACE 2 SPRAYS INTO BOTH NOSTRILS DAILY 16 g 2  . glimepiride (AMARYL) 2 MG tablet Take 1 tablet (2 mg total) by mouth daily as needed (hyperglycemia).    . Insulin Glargine (BASAGLAR KWIKPEN) 100 UNIT/ML SOPN Inject 0.4 mLs (40 Units total) into the skin 2 (two) times daily. 15 mL 5  . Insulin Pen Needle (ULTICARE MINI PEN NEEDLES) 31G X 6 MM MISC USE AS DIRECTED FOR LANTUS SOLOSTAR PEN 100 each 3  . isosorbide mononitrate (IMDUR) 60 MG 24 hr tablet TAKE 3 TABLETS BY MOUTH DAILY 90 tablet 0  . levothyroxine (SYNTHROID, LEVOTHROID) 125  MCG tablet Take 1 tablet (125 mcg total) by mouth daily. 30 tablet 3  . lisinopril (PRINIVIL,ZESTRIL) 20 MG tablet Take 1 tablet (20 mg total) by mouth daily. 90 tablet 3  . MAGNESIUM PO Take 1 tablet by mouth daily.    . metFORMIN (GLUCOPHAGE) 1000 MG tablet TAKE 1 TABLET BY MOUTH TWICE A DAY WITH A MEAL 180 tablet 1  . metoCLOPramide (REGLAN) 10 MG tablet TAKE 1 TABLET BY MOUTH 3 TIMES DAILY BEFORE MEALS 90 tablet 6  . metolazone (ZAROXOLYN) 2.5 MG tablet Take 1 tablet (2.5 mg total) by mouth daily as needed (for more than 3lb/day or 5lb/wk weight gain).    . metoprolol succinate (TOPROL-XL) 50 MG 24 hr tablet Take 50 mg daily by mouth. Take with or immediately following a meal.    . Multiple Vitamin (MULITIVITAMIN WITH MINERALS) TABS Take 1 tablet by mouth daily.    . nitroGLYCERIN (NITROSTAT) 0.4 MG SL tablet Place 1 tablet (0.4 mg total) under the tongue every 5 (five) minutes as needed for chest pain. 60 tablet 3  . polyethylene glycol (MIRALAX / GLYCOLAX) packet 17grams in 16 oz of water twice a day until bowel movement.  LAXITIVE.  Restart if two days since last bowel movement 14 each 0  . potassium chloride (K-DUR,KLOR-CON) 10 MEQ tablet Take 10 mEq by mouth 2 (two) times daily.    Marland Kitchen spironolactone (ALDACTONE) 25 MG tablet Take 0.5 tablets by mouth daily.    Marland Kitchen ULTICARE MINI PEN NEEDLES 31G X 6 MM MISC     . VENTOLIN HFA 108 (90 Base) MCG/ACT inhaler INHALE 2 PUFFS INTO THE LUNGS EVERY 6 HOURS AS NEEDED FOR WHEEZING ORSHORTNESS OF BREATH 18 g 6  . vitamin E 400 UNIT capsule Take 400 Units by mouth daily.     No facility-administered medications prior to visit.      Per HPI unless specifically indicated in ROS section below Review of Systems  Constitutional: Positive for fatigue. Negative for activity change, appetite change, chills, fever and unexpected weight change.  HENT: Negative for hearing loss.   Eyes: Negative for visual disturbance.  Respiratory: Positive for cough and  shortness of breath. Negative for chest tightness and wheezing.        Chronic  Cardiovascular: Positive for chest pain. Negative for palpitations and leg swelling.  Gastrointestinal: Positive for diarrhea (occasional). Negative for abdominal distention, abdominal pain, blood in stool, constipation, nausea and vomiting.  Genitourinary: Negative for difficulty urinating and hematuria.  Musculoskeletal: Negative for arthralgias, myalgias and neck pain.  Skin: Negative for rash.  Neurological: Positive for dizziness and headaches. Negative for seizures and syncope.  Hematological: Negative for adenopathy. Does not bruise/bleed easily.  Psychiatric/Behavioral: Negative for dysphoric mood. The patient is not nervous/anxious.  Objective:    BP 124/78 (BP Location: Left Arm, Patient Position: Sitting, Cuff Size: Large)   Pulse 68   Temp 97.8 F (36.6 C) (Oral)   Ht 5' 10"  (1.778 m)   Wt (!) 315 lb (142.9 kg)   SpO2 97%   BMI 45.20 kg/m   Wt Readings from Last 3 Encounters:  11/20/17 (!) 315 lb (142.9 kg)  11/16/17 (!) 307 lb 8 oz (139.5 kg)  10/26/17 (!) 313 lb 4 oz (142.1 kg)    Physical Exam  Constitutional: He is oriented to person, place, and time. He appears well-developed and well-nourished. No distress.  HENT:  Head: Normocephalic and atraumatic.  Right Ear: Hearing, tympanic membrane, external ear and ear canal normal.  Left Ear: Hearing, tympanic membrane, external ear and ear canal normal.  Nose: Nose normal.  Mouth/Throat: Uvula is midline, oropharynx is clear and moist and mucous membranes are normal. No oropharyngeal exudate, posterior oropharyngeal edema or posterior oropharyngeal erythema.  Eyes: Pupils are equal, round, and reactive to light. Conjunctivae and EOM are normal. No scleral icterus.  Neck: Normal range of motion. Neck supple. No thyromegaly present.  Cardiovascular: Normal rate, regular rhythm, normal heart sounds and intact distal pulses.  No murmur  heard. Pulses:      Radial pulses are 2+ on the right side, and 2+ on the left side.  Pulmonary/Chest: Effort normal and breath sounds normal. No respiratory distress. He has no wheezes. He has no rales.  Abdominal: Soft. Bowel sounds are normal. He exhibits no distension and no mass. There is no tenderness. There is no rebound and no guarding.  Musculoskeletal: Normal range of motion. He exhibits no edema.  Lymphadenopathy:    He has no cervical adenopathy.  Neurological: He is alert and oriented to person, place, and time.  CN grossly intact, station and gait intact  Skin: Skin is warm and dry. No rash noted.  Psychiatric: He has a normal mood and affect. His behavior is normal. Judgment and thought content normal.  Nursing note and vitals reviewed.  Results for orders placed or performed in visit on 11/16/17  Hepatic function panel  Result Value Ref Range   Total Bilirubin 1.2 0.2 - 1.2 mg/dL   Bilirubin, Direct 0.3 0.0 - 0.3 mg/dL   Alkaline Phosphatase 163 (H) 39 - 117 U/L   AST 42 (H) 0 - 37 U/L   ALT 38 0 - 53 U/L   Total Protein 6.5 6.0 - 8.3 g/dL   Albumin 4.3 3.5 - 5.2 g/dL  PSA, Medicare  Result Value Ref Range   PSA 0.92 0.10 - 4.00 ng/ml  TSH  Result Value Ref Range   TSH 2.45 0.35 - 4.50 uIU/mL  Lipid panel  Result Value Ref Range   Cholesterol 116 0 - 200 mg/dL   Triglycerides 145.0 0.0 - 149.0 mg/dL   HDL 36.40 (L) >39.00 mg/dL   VLDL 29.0 0.0 - 40.0 mg/dL   LDL Cholesterol 51 0 - 99 mg/dL   Total CHOL/HDL Ratio 3    NonHDL 79.97       Assessment & Plan:   Problem List Items Addressed This Visit    Type 2 diabetes mellitus, uncontrolled, with retinopathy ([place]) - Primary    Chronic, above goal. Pt endorses sugar readings slowly improving since splitting insulin dosing. No med changes - will reassess at 57moDM f/u. Consider restarting amaryl.      Obesity, Class III, BMI 40-49.9 (morbid obesity) (HCC) (Chronic)  Continue to encourage healthy diet and  lifestyle to attain sustainable weight loss.       Liver cirrhosis secondary to NASH ([place]) (Chronic)    Has seen GI. Mild transaminitis - will continue to monitor.       Hypothyroidism    Chronic, stable on current regimen - continue.      HLD (hyperlipidemia)    Chronic, stable on lipitor 41m daily - continue.  The ASCVD Risk score (Mikey BussingDC Jr., et al., 2013) failed to calculate for the following reasons:   The valid total cholesterol range is 130 to 320 mg/dL       GERD (gastroesophageal reflux disease)    Severe - continue dexilant 667mdaily.       Diabetic gastroparesis (HCC)    Continue reglan 1059mid.       CRVO (central retinal vein occlusion)    H/o this. Sees eye doctor regularly.      Coronary artery disease, non-occlusive (Chronic)    Appreciate cards care. S/p stent mid LAD.       Chronic diastolic CHF (congestive heart failure) ([place]  [facility]       Bariatric surgery status       No orders of the defined types were placed in this encounter.  No orders of the defined types were placed in this encounter.   Follow up plan: Return in about 3 months (around 02/19/2018) for follow up visit.  JavRia BushD

## 2017-11-20 ENCOUNTER — Ambulatory Visit (INDEPENDENT_AMBULATORY_CARE_PROVIDER_SITE_OTHER): Payer: Medicare Other | Admitting: Family Medicine

## 2017-11-20 ENCOUNTER — Encounter: Payer: Self-pay | Admitting: Family Medicine

## 2017-11-20 VITALS — BP 124/78 | HR 68 | Temp 97.8°F | Ht 70.0 in | Wt 315.0 lb

## 2017-11-20 DIAGNOSIS — H34812 Central retinal vein occlusion, left eye, with macular edema: Secondary | ICD-10-CM | POA: Diagnosis not present

## 2017-11-20 DIAGNOSIS — E039 Hypothyroidism, unspecified: Secondary | ICD-10-CM | POA: Diagnosis not present

## 2017-11-20 DIAGNOSIS — E11319 Type 2 diabetes mellitus with unspecified diabetic retinopathy without macular edema: Secondary | ICD-10-CM | POA: Diagnosis not present

## 2017-11-20 DIAGNOSIS — K3184 Gastroparesis: Secondary | ICD-10-CM

## 2017-11-20 DIAGNOSIS — K7460 Unspecified cirrhosis of liver: Secondary | ICD-10-CM

## 2017-11-20 DIAGNOSIS — K7581 Nonalcoholic steatohepatitis (NASH): Secondary | ICD-10-CM | POA: Diagnosis not present

## 2017-11-20 DIAGNOSIS — E1143 Type 2 diabetes mellitus with diabetic autonomic (poly)neuropathy: Secondary | ICD-10-CM

## 2017-11-20 DIAGNOSIS — K219 Gastro-esophageal reflux disease without esophagitis: Secondary | ICD-10-CM | POA: Diagnosis not present

## 2017-11-20 DIAGNOSIS — E1165 Type 2 diabetes mellitus with hyperglycemia: Secondary | ICD-10-CM

## 2017-11-20 DIAGNOSIS — I2510 Atherosclerotic heart disease of native coronary artery without angina pectoris: Secondary | ICD-10-CM

## 2017-11-20 DIAGNOSIS — E782 Mixed hyperlipidemia: Secondary | ICD-10-CM

## 2017-11-20 DIAGNOSIS — I5032 Chronic diastolic (congestive) heart failure: Secondary | ICD-10-CM | POA: Diagnosis not present

## 2017-11-20 DIAGNOSIS — IMO0002 Reserved for concepts with insufficient information to code with codable children: Secondary | ICD-10-CM

## 2017-11-20 DIAGNOSIS — Z9884 Bariatric surgery status: Secondary | ICD-10-CM

## 2017-11-20 NOTE — Assessment & Plan Note (Signed)
Severe - continue dexilant 26m daily.

## 2017-11-20 NOTE — Assessment & Plan Note (Signed)
Continue to encourage healthy diet and lifestyle to attain sustainable weight loss.

## 2017-11-20 NOTE — Assessment & Plan Note (Signed)
Followed by [facility] and [facility] cardiology.

## 2017-11-20 NOTE — Assessment & Plan Note (Signed)
H/o this. Sees eye doctor regularly.

## 2017-11-20 NOTE — Assessment & Plan Note (Addendum)
Chronic, above goal. Pt endorses sugar readings slowly improving since splitting insulin dosing. No med changes - will reassess at 75moDM f/u. Consider restarting amaryl.

## 2017-11-20 NOTE — Assessment & Plan Note (Signed)
Chronic, stable on lipitor 6m daily - continue.  The ASCVD Risk score (Chad BussingDC Jr., Chad al., Chad [name]) failed to calculate for the following reasons:   The valid total cholesterol range is 130 to 320 mg/dL

## 2017-11-20 NOTE — Assessment & Plan Note (Signed)
Appreciate cards care. S/p stent mid LAD.

## 2017-11-20 NOTE — Assessment & Plan Note (Signed)
Chronic, stable on current regimen - continue.

## 2017-11-20 NOTE — Assessment & Plan Note (Signed)
Has seen GI. Mild transaminitis - will continue to monitor.

## 2017-11-20 NOTE — Assessment & Plan Note (Signed)
Continue reglan 19m tid.

## 2017-11-20 NOTE — Patient Instructions (Addendum)
No med changes for now.  Return in 3 months for follow up visit - we may add glimepiride at that time.  If interested, check with pharmacy about new 2 shot shingles series (shingrix).  Keep working on walking as able.   Health Maintenance, Male A healthy lifestyle and preventive care is important for your health and wellness. Ask your health care provider about what schedule of regular examinations is right for you. What should I know about weight and diet? Eat a Healthy Diet  Eat plenty of vegetables, fruits, whole grains, low-fat dairy products, and lean protein.  Do not eat a lot of foods high in solid fats, added sugars, or salt.  Maintain a Healthy Weight Regular exercise can help you achieve or maintain a healthy weight. You should:  Do at least 150 minutes of exercise each week. The exercise should increase your heart rate and make you sweat (moderate-intensity exercise).  Do strength-training exercises at least twice a week.  Watch Your Levels of Cholesterol and Blood Lipids  Have your blood tested for lipids and cholesterol every 5 years starting at 62 years of age. If you are at high risk for heart disease, you should start having your blood tested when you are 62 years old. You may need to have your cholesterol levels checked more often if: ? Your lipid or cholesterol levels are high. ? You are older than 62 years of age. ? You are at high risk for heart disease.  What should I know about cancer screening? Many types of cancers can be detected early and may often be prevented. Lung Cancer  You should be screened every year for lung cancer if: ? You are a current smoker who has smoked for at least 30 years. ? You are a former smoker who has quit within the past 15 years.  Talk to your health care provider about your screening options, when you should start screening, and how often you should be screened.  Colorectal Cancer  Routine colorectal cancer screening usually  begins at 62 years of age and should be repeated every 5-10 years until you are 62 years old. You may need to be screened more often if early forms of precancerous polyps or small growths are found. Your health care provider may recommend screening at an earlier age if you have risk factors for colon cancer.  Your health care provider may recommend using home test kits to check for hidden blood in the stool.  A small camera at the end of a tube can be used to examine your colon (sigmoidoscopy or colonoscopy). This checks for the earliest forms of colorectal cancer.  Prostate and Testicular Cancer  Depending on your age and overall health, your health care provider may do certain tests to screen for prostate and testicular cancer.  Talk to your health care provider about any symptoms or concerns you have about testicular or prostate cancer.  Skin Cancer  Check your skin from head to toe regularly.  Tell your health care provider about any new moles or changes in moles, especially if: ? There is a change in a mole's size, shape, or color. ? You have a mole that is larger than a pencil eraser.  Always use sunscreen. Apply sunscreen liberally and repeat throughout the day.  Protect yourself by wearing long sleeves, pants, a wide-brimmed hat, and sunglasses when outside.  What should I know about heart disease, diabetes, and high blood pressure?  If you are 18-39 years of  age, have your blood pressure checked every 3-5 years. If you are 49 years of age or older, have your blood pressure checked every year. You should have your blood pressure measured twice-once when you are at a hospital or clinic, and once when you are not at a hospital or clinic. Record the average of the two measurements. To check your blood pressure when you are not at a hospital or clinic, you can use: ? An automated blood pressure machine at a pharmacy. ? A home blood pressure monitor.  Talk to your health care  provider about your target blood pressure.  If you are between 51-38 years old, ask your health care provider if you should take aspirin to prevent heart disease.  Have regular diabetes screenings by checking your fasting blood sugar level. ? If you are at a normal weight and have a low risk for diabetes, have this test once every three years after the age of 27. ? If you are overweight and have a high risk for diabetes, consider being tested at a younger age or more often.  A one-time screening for abdominal aortic aneurysm (AAA) by ultrasound is recommended for men aged 81-75 years who are current or former smokers. What should I know about preventing infection? Hepatitis B If you have a higher risk for hepatitis B, you should be screened for this virus. Talk with your health care provider to find out if you are at risk for hepatitis B infection. Hepatitis C Blood testing is recommended for:  Everyone born from [birth year] through 1965.  Anyone with known risk factors for hepatitis C.  Sexually Transmitted Diseases (STDs)  You should be screened each year for STDs including gonorrhea and chlamydia if: ? You are sexually active and are younger than 62 years of age. ? You are older than 62 years of age and your health care provider tells you that you are at risk for this type of infection. ? Your sexual activity has changed since you were last screened and you are at an increased risk for chlamydia or gonorrhea. Ask your health care provider if you are at risk.  Talk with your health care provider about whether you are at high risk of being infected with HIV. Your health care provider may recommend a prescription medicine to help prevent HIV infection.  What else can I do?  Schedule regular health, dental, and eye exams.  Stay current with your vaccines (immunizations).  Do not use any tobacco products, such as cigarettes, chewing tobacco, and e-cigarettes. If you need help quitting, ask  your health care provider.  Limit alcohol intake to no more than 2 drinks per day. One drink equals 12 ounces of beer, 5 ounces of wine, or 1 ounces of hard liquor.  Do not use street drugs.  Do not share needles.  Ask your health care provider for help if you need support or information about quitting drugs.  Tell your health care provider if you often feel depressed.  Tell your health care provider if you have ever been abused or do not feel safe at home. This information is not intended to replace advice given to you by your health care provider. Make sure you discuss any questions you have with your health care provider. Document Released: 08/06/2007 Document Revised: 10/07/2015 Document Reviewed: 11/11/2014 Elsevier Interactive Patient Education  [employer].

## 2017-11-21 NOTE — Progress Notes (Signed)
I reviewed health advisor's note, was available for consultation, and agree with documentation and plan.  

## 2017-12-04 ENCOUNTER — Other Ambulatory Visit: Payer: Self-pay | Admitting: Family Medicine

## 2017-12-07 ENCOUNTER — Ambulatory Visit: Payer: Medicare Other | Admitting: Family Medicine

## 2017-12-18 ENCOUNTER — Other Ambulatory Visit: Payer: Self-pay | Admitting: Family Medicine

## 2017-12-19 DIAGNOSIS — H34812 Central retinal vein occlusion, left eye, with macular edema: Secondary | ICD-10-CM | POA: Diagnosis not present

## 2017-12-19 DIAGNOSIS — H2511 Age-related nuclear cataract, right eye: Secondary | ICD-10-CM | POA: Diagnosis not present

## 2017-12-19 DIAGNOSIS — E113291 Type 2 diabetes mellitus with mild nonproliferative diabetic retinopathy without macular edema, right eye: Secondary | ICD-10-CM | POA: Diagnosis not present

## 2017-12-19 DIAGNOSIS — H43813 Vitreous degeneration, bilateral: Secondary | ICD-10-CM | POA: Diagnosis not present

## 2017-12-19 LAB — HM DIABETES EYE EXAM

## 2018-01-05 ENCOUNTER — Encounter: Payer: Self-pay | Admitting: Family Medicine

## 2018-01-08 ENCOUNTER — Other Ambulatory Visit: Payer: Self-pay | Admitting: Family Medicine

## 2018-01-11 ENCOUNTER — Other Ambulatory Visit: Payer: Self-pay | Admitting: Family Medicine

## 2018-01-13 ENCOUNTER — Other Ambulatory Visit: Payer: Self-pay | Admitting: Family Medicine

## 2018-01-23 DIAGNOSIS — R0602 Shortness of breath: Secondary | ICD-10-CM | POA: Diagnosis not present

## 2018-01-23 DIAGNOSIS — I5032 Chronic diastolic (congestive) heart failure: Secondary | ICD-10-CM | POA: Diagnosis not present

## 2018-01-23 DIAGNOSIS — I110 Hypertensive heart disease with heart failure: Secondary | ICD-10-CM | POA: Diagnosis not present

## 2018-01-23 DIAGNOSIS — Z794 Long term (current) use of insulin: Secondary | ICD-10-CM | POA: Diagnosis not present

## 2018-01-23 DIAGNOSIS — E039 Hypothyroidism, unspecified: Secondary | ICD-10-CM | POA: Diagnosis not present

## 2018-01-23 DIAGNOSIS — R0789 Other chest pain: Secondary | ICD-10-CM | POA: Diagnosis not present

## 2018-01-23 DIAGNOSIS — E119 Type 2 diabetes mellitus without complications: Secondary | ICD-10-CM | POA: Diagnosis not present

## 2018-01-23 DIAGNOSIS — I4510 Unspecified right bundle-branch block: Secondary | ICD-10-CM | POA: Diagnosis not present

## 2018-01-23 DIAGNOSIS — R079 Chest pain, unspecified: Secondary | ICD-10-CM | POA: Diagnosis not present

## 2018-01-23 DIAGNOSIS — R110 Nausea: Secondary | ICD-10-CM | POA: Diagnosis not present

## 2018-01-24 DIAGNOSIS — I5030 Unspecified diastolic (congestive) heart failure: Secondary | ICD-10-CM | POA: Diagnosis not present

## 2018-01-24 DIAGNOSIS — E119 Type 2 diabetes mellitus without complications: Secondary | ICD-10-CM | POA: Diagnosis not present

## 2018-01-24 DIAGNOSIS — I25110 Atherosclerotic heart disease of native coronary artery with unstable angina pectoris: Secondary | ICD-10-CM | POA: Diagnosis not present

## 2018-01-24 DIAGNOSIS — I10 Essential (primary) hypertension: Secondary | ICD-10-CM | POA: Diagnosis not present

## 2018-01-24 DIAGNOSIS — R079 Chest pain, unspecified: Secondary | ICD-10-CM | POA: Diagnosis not present

## 2018-01-25 DIAGNOSIS — Z794 Long term (current) use of insulin: Secondary | ICD-10-CM | POA: Diagnosis not present

## 2018-01-25 DIAGNOSIS — G8929 Other chronic pain: Secondary | ICD-10-CM | POA: Diagnosis present

## 2018-01-25 DIAGNOSIS — I110 Hypertensive heart disease with heart failure: Secondary | ICD-10-CM | POA: Diagnosis present

## 2018-01-25 DIAGNOSIS — Z96659 Presence of unspecified artificial knee joint: Secondary | ICD-10-CM | POA: Diagnosis present

## 2018-01-25 DIAGNOSIS — I10 Essential (primary) hypertension: Secondary | ICD-10-CM | POA: Diagnosis not present

## 2018-01-25 DIAGNOSIS — Z9884 Bariatric surgery status: Secondary | ICD-10-CM | POA: Diagnosis not present

## 2018-01-25 DIAGNOSIS — I5030 Unspecified diastolic (congestive) heart failure: Secondary | ICD-10-CM | POA: Diagnosis not present

## 2018-01-25 DIAGNOSIS — Z9049 Acquired absence of other specified parts of digestive tract: Secondary | ICD-10-CM | POA: Diagnosis not present

## 2018-01-25 DIAGNOSIS — I2510 Atherosclerotic heart disease of native coronary artery without angina pectoris: Secondary | ICD-10-CM | POA: Diagnosis present

## 2018-01-25 DIAGNOSIS — R0789 Other chest pain: Secondary | ICD-10-CM | POA: Diagnosis present

## 2018-01-25 DIAGNOSIS — Z7982 Long term (current) use of aspirin: Secondary | ICD-10-CM | POA: Diagnosis not present

## 2018-01-25 DIAGNOSIS — R079 Chest pain, unspecified: Secondary | ICD-10-CM | POA: Diagnosis not present

## 2018-01-25 DIAGNOSIS — H9193 Unspecified hearing loss, bilateral: Secondary | ICD-10-CM | POA: Diagnosis present

## 2018-01-25 DIAGNOSIS — E119 Type 2 diabetes mellitus without complications: Secondary | ICD-10-CM | POA: Diagnosis not present

## 2018-01-25 DIAGNOSIS — E8770 Fluid overload, unspecified: Secondary | ICD-10-CM | POA: Diagnosis present

## 2018-01-25 DIAGNOSIS — I5032 Chronic diastolic (congestive) heart failure: Secondary | ICD-10-CM | POA: Diagnosis present

## 2018-01-25 DIAGNOSIS — Z79899 Other long term (current) drug therapy: Secondary | ICD-10-CM | POA: Diagnosis not present

## 2018-01-25 DIAGNOSIS — I208 Other forms of angina pectoris: Secondary | ICD-10-CM | POA: Diagnosis not present

## 2018-01-25 DIAGNOSIS — Z955 Presence of coronary angioplasty implant and graft: Secondary | ICD-10-CM | POA: Diagnosis not present

## 2018-01-25 DIAGNOSIS — F4321 Adjustment disorder with depressed mood: Secondary | ICD-10-CM | POA: Diagnosis present

## 2018-01-25 DIAGNOSIS — K219 Gastro-esophageal reflux disease without esophagitis: Secondary | ICD-10-CM | POA: Diagnosis present

## 2018-01-25 DIAGNOSIS — Z7902 Long term (current) use of antithrombotics/antiplatelets: Secondary | ICD-10-CM | POA: Diagnosis not present

## 2018-01-25 DIAGNOSIS — E039 Hypothyroidism, unspecified: Secondary | ICD-10-CM | POA: Diagnosis not present

## 2018-01-25 DIAGNOSIS — E785 Hyperlipidemia, unspecified: Secondary | ICD-10-CM | POA: Diagnosis present

## 2018-01-25 MED ORDER — SPIRONOLACTONE 25 MG PO TABS
12.50 | ORAL_TABLET | ORAL | Status: DC
Start: 2018-01-26 — End: 2018-01-25

## 2018-01-25 MED ORDER — NITROGLYCERIN 0.4 MG SL SUBL
0.40 | SUBLINGUAL_TABLET | SUBLINGUAL | Status: DC
Start: ? — End: 2018-01-25

## 2018-01-25 MED ORDER — METOPROLOL SUCCINATE ER 50 MG PO TB24
50.00 | ORAL_TABLET | ORAL | Status: DC
Start: 2018-01-26 — End: 2018-01-25

## 2018-01-25 MED ORDER — THERA-M PO TABS
1.00 | ORAL_TABLET | ORAL | Status: DC
Start: 2018-01-26 — End: 2018-01-25

## 2018-01-25 MED ORDER — ACETAMINOPHEN 325 MG PO TABS
650.00 | ORAL_TABLET | ORAL | Status: DC
Start: ? — End: 2018-01-25

## 2018-01-25 MED ORDER — INSULIN GLARGINE 100 UNIT/ML ~~LOC~~ SOLN
65.00 | SUBCUTANEOUS | Status: DC
Start: 2018-01-25 — End: 2018-01-25

## 2018-01-25 MED ORDER — GENERIC EXTERNAL MEDICATION
180.00 | Status: DC
Start: 2018-01-26 — End: 2018-01-25

## 2018-01-25 MED ORDER — INSULIN LISPRO 100 UNIT/ML ~~LOC~~ SOLN
1.00 | SUBCUTANEOUS | Status: DC
Start: 2018-01-25 — End: 2018-01-25

## 2018-01-25 MED ORDER — LEVOTHYROXINE SODIUM 125 MCG PO TABS
125.00 | ORAL_TABLET | ORAL | Status: DC
Start: 2018-01-26 — End: 2018-01-25

## 2018-01-25 MED ORDER — GENERIC EXTERNAL MEDICATION
16.00 | Status: DC
Start: ? — End: 2018-01-25

## 2018-01-25 MED ORDER — SALINE NASAL SPRAY 0.65 % NA SOLN
1.00 | NASAL | Status: DC
Start: ? — End: 2018-01-25

## 2018-01-25 MED ORDER — ACETAMINOPHEN 325 MG PO TABS
650.00 | ORAL_TABLET | ORAL | Status: DC
Start: 2018-01-25 — End: 2018-01-25

## 2018-01-25 MED ORDER — MELATONIN 3 MG PO TABS
3.00 | ORAL_TABLET | ORAL | Status: DC
Start: ? — End: 2018-01-25

## 2018-01-25 MED ORDER — DEXTROSE 50 % IV SOLN
12.50 | INTRAVENOUS | Status: DC
Start: ? — End: 2018-01-25

## 2018-01-25 MED ORDER — ASPIRIN 81 MG PO CHEW
81.00 | CHEWABLE_TABLET | ORAL | Status: DC
Start: 2018-01-26 — End: 2018-01-25

## 2018-01-25 MED ORDER — CLOPIDOGREL BISULFATE 75 MG PO TABS
75.00 | ORAL_TABLET | ORAL | Status: DC
Start: 2018-01-26 — End: 2018-01-25

## 2018-01-25 MED ORDER — PANTOPRAZOLE SODIUM 40 MG PO TBEC
40.00 | DELAYED_RELEASE_TABLET | ORAL | Status: DC
Start: 2018-01-26 — End: 2018-01-25

## 2018-01-25 MED ORDER — AMLODIPINE BESYLATE 5 MG PO TABS
5.00 | ORAL_TABLET | ORAL | Status: DC
Start: ? — End: 2018-01-25

## 2018-01-25 MED ORDER — ATORVASTATIN CALCIUM 40 MG PO TABS
40.00 | ORAL_TABLET | ORAL | Status: DC
Start: 2018-01-26 — End: 2018-01-25

## 2018-01-25 MED ORDER — LISINOPRIL 10 MG PO TABS
10.00 | ORAL_TABLET | ORAL | Status: DC
Start: 2018-01-26 — End: 2018-01-25

## 2018-01-25 MED ORDER — POLYETHYLENE GLYCOL 3350 17 G PO PACK
17.00 | PACK | ORAL | Status: DC
Start: 2018-01-25 — End: 2018-01-25

## 2018-01-25 MED ORDER — CITALOPRAM HYDROBROMIDE 20 MG PO TABS
20.00 | ORAL_TABLET | ORAL | Status: DC
Start: 2018-01-26 — End: 2018-01-25

## 2018-01-25 MED ORDER — GENERIC EXTERNAL MEDICATION
2.00 | Status: DC
Start: ? — End: 2018-01-25

## 2018-01-30 ENCOUNTER — Ambulatory Visit (INDEPENDENT_AMBULATORY_CARE_PROVIDER_SITE_OTHER): Payer: Medicare Other | Admitting: Family Medicine

## 2018-01-30 ENCOUNTER — Encounter: Payer: Self-pay | Admitting: Family Medicine

## 2018-01-30 VITALS — BP 114/62 | HR 78 | Temp 98.2°F | Ht 70.0 in | Wt 310.2 lb

## 2018-01-30 DIAGNOSIS — I5032 Chronic diastolic (congestive) heart failure: Secondary | ICD-10-CM

## 2018-01-30 DIAGNOSIS — I10 Essential (primary) hypertension: Secondary | ICD-10-CM | POA: Diagnosis not present

## 2018-01-30 DIAGNOSIS — K7581 Nonalcoholic steatohepatitis (NASH): Secondary | ICD-10-CM

## 2018-01-30 DIAGNOSIS — K219 Gastro-esophageal reflux disease without esophagitis: Secondary | ICD-10-CM

## 2018-01-30 DIAGNOSIS — R079 Chest pain, unspecified: Secondary | ICD-10-CM

## 2018-01-30 DIAGNOSIS — IMO0002 Reserved for concepts with insufficient information to code with codable children: Secondary | ICD-10-CM

## 2018-01-30 DIAGNOSIS — E039 Hypothyroidism, unspecified: Secondary | ICD-10-CM | POA: Diagnosis not present

## 2018-01-30 DIAGNOSIS — E11319 Type 2 diabetes mellitus with unspecified diabetic retinopathy without macular edema: Secondary | ICD-10-CM | POA: Diagnosis not present

## 2018-01-30 DIAGNOSIS — E1165 Type 2 diabetes mellitus with hyperglycemia: Secondary | ICD-10-CM

## 2018-01-30 DIAGNOSIS — I2510 Atherosclerotic heart disease of native coronary artery without angina pectoris: Secondary | ICD-10-CM

## 2018-01-30 DIAGNOSIS — K7460 Unspecified cirrhosis of liver: Secondary | ICD-10-CM

## 2018-01-30 LAB — T4, FREE: Free T4: 0.92 ng/dL (ref 0.60–1.60)

## 2018-01-30 LAB — MAGNESIUM: Magnesium: 1.5 mg/dL (ref 1.5–2.5)

## 2018-01-30 LAB — RENAL FUNCTION PANEL
Albumin: 4.5 g/dL (ref 3.5–5.2)
BUN: 16 mg/dL (ref 6–23)
CHLORIDE: 94 meq/L — AB (ref 96–112)
CO2: 29 mEq/L (ref 19–32)
Calcium: 9.2 mg/dL (ref 8.4–10.5)
Creatinine, Ser: 1.05 mg/dL (ref 0.40–1.50)
GFR: 75.84 mL/min (ref >60.00)
Glucose, Bld: 279 mg/dL — ABNORMAL HIGH (ref 70–99)
Phosphorus: 3.5 mg/dL (ref 2.3–4.6)
Potassium: 3.6 mEq/L (ref 3.5–5.1)
Sodium: 134 mEq/L — ABNORMAL LOW (ref 135–145)

## 2018-01-30 LAB — HEMOGLOBIN A1C: Hgb A1c MFr Bld: 7.6 % — ABNORMAL HIGH (ref 4.6–6.5)

## 2018-01-30 LAB — TSH: TSH: 2.58 u[IU]/mL (ref 0.35–4.50)

## 2018-01-30 MED ORDER — BASAGLAR KWIKPEN 100 UNIT/ML ~~LOC~~ SOPN: 50.0000 [IU] | PEN_INJECTOR | Freq: Two times a day (BID) | SUBCUTANEOUS | 5 refills | Status: DC

## 2018-01-30 MED ORDER — FAMOTIDINE 20 MG PO TABS: 20.0000 mg | ORAL_TABLET | Freq: Every day | ORAL | 1 refills | Status: DC

## 2018-01-30 NOTE — Assessment & Plan Note (Signed)
Will need updated imaging likely start with abd [place] next visit.

## 2018-01-30 NOTE — Assessment & Plan Note (Signed)
Chronic, stable today. Continue current regimen.

## 2018-01-30 NOTE — Assessment & Plan Note (Signed)
Severe - continue dexilant 45m daily, discussed administering 30 min prior to largest meal. Will also add pepcid 294mnightly and monitor effect on chest discomfort.

## 2018-01-30 NOTE — Patient Instructions (Addendum)
Labs today including A1c.  Continue dexilant daily but take 30 mins prior to largest meal each day. Add pepcid 82m at bedtime for reflux/indigestion.  Keep cardiology appointment.  Drop off sugar log over next few weeks to review #s.

## 2018-01-30 NOTE — Assessment & Plan Note (Signed)
TFTs were abnormal during hospitalization - ?sick euthyroid - will repeat TFTs today as he's feeling better.

## 2018-01-30 NOTE — Assessment & Plan Note (Signed)
Followed by [facility] cardiology. Latest hospitalization with chest pain that was thought to be non cardiac in nature according to discharge summary.

## 2018-01-30 NOTE — Assessment & Plan Note (Signed)
Continue bumex and regular monitoring with [facility]. He was recently found to have low Mg and K - will update today.

## 2018-01-30 NOTE — Assessment & Plan Note (Signed)
He is currently taking basaglar 50u BID. Med list updated.

## 2018-01-30 NOTE — Assessment & Plan Note (Addendum)
?  fluid overload vs noncardiac cause.  Will trial pepcid nightly.  He will keep f/u cardiology appt next week.  TCM visit not completed today.

## 2018-01-30 NOTE — Progress Notes (Signed)
BP 114/62 (BP Location: Left Arm, Patient Position: Sitting, Cuff Size: Large)   Pulse 78   Temp 98.2 F (36.8 C) (Oral)   Ht 5' 10"  (1.778 m)   Wt (!) 310 lb 4 oz (140.7 kg)   SpO2 96%   BMI 44.52 kg/m    CC: hosp f/u visit Subjective:    Patient ID: Chad [name], male    DOB: [birth year], 62 y.o.   MRN: [id number]  HPI: [name] is a 62 y.o. male presenting on 01/30/2018 for Hospitalization Follow-up   Recent hospitalization at [facility] healthcare 12/3-06/2017 for unstable angina, discharge summary reviewed. Negative cardiac enzymes cycled, CXR and proBNP normal. Last LHC 03/2017 without significant CAD and patent LAD stent. PET scan showed normal perfusion without ischemia or scar. For HFpEF, Cardiomems program in place. Told fluid around heart caused heart arteries to spasm.   Fluctuating blood pressures - 99-371 systolic during hospitalization. Blood pressures seemed to drop after nitro paste then stabilized.   Has f/u with [facility] cardiology scheduled 02/06/2018.   F/u recommendations: Recheck thyroid studies (TSH and free T4 were elevated) Check BMP, Mg (Cr of 1.17 on discharge) Check vitals Consider workup for other causes of non cardiac chest pain Confirm home medications.  Relevant past medical, surgical, family and social history reviewed and updated as indicated. Interim medical history since our last visit reviewed. Allergies and medications reviewed and updated. Outpatient Medications Prior to Visit  Medication Sig Dispense Refill  . albuterol (PROVENTIL HFA;VENTOLIN HFA) 108 (90 Base) MCG/ACT inhaler INHALE 2 PUFFS INTO THE LUNGS EVERY 6 HOURS AS NEEDED FOR WHEEZING ORSHORTNESS OF BREATH 18 g 6  . amLODipine (NORVASC) 5 MG tablet Take 1 tablet (5 mg total) by mouth daily.    Marland Kitchen aspirin 81 MG chewable tablet Chew 81 mg by mouth daily.     Marland Kitchen atorvastatin (LIPITOR) 20 MG tablet Take 1 tablet (20 mg total) by mouth daily.    . bumetanide (BUMEX) 2 MG tablet Take 1  tablet (2 mg total) by mouth 2 (two) times daily as needed (weight gain/leg swelling).    . citalopram (CELEXA) 20 MG tablet Take 1 tablet (20 mg total) by mouth daily. 90 tablet 3  . clopidogrel (PLAVIX) 75 MG tablet Take 1 tablet (75 mg total) by mouth daily.    . clotrimazole-betamethasone (LOTRISONE) cream Apply 1 application topically 2 (two) times daily. 30 g 0  . DEXILANT 60 MG capsule TAKE 1 CAPSULE BY MOUTH ONCE DAILY 30 capsule 3  . diclofenac sodium (VOLTAREN) 1 % GEL Apply 1 application topically 3 (three) times daily. 1 Tube 1  . Docusate Sodium (DSS) 100 MG CAPS 1 tab 2 times a day while on narcotics.  STOOL SOFTENER 60 each 0  . fluticasone (FLONASE) 50 MCG/ACT nasal spray PLACE 2 SPRAYS INTO BOTH NOSTRILS DAILY 16 g 2  . Insulin Pen Needle (ULTICARE MINI PEN NEEDLES) 31G X 6 MM MISC USE AS DIRECTED FOR LANTUS SOLOSTAR PEN 100 each 3  . isosorbide mononitrate (IMDUR) 60 MG 24 hr tablet TAKE 3 TABLETS BY MOUTH ONCE DAILY 90 tablet 2  . levothyroxine (SYNTHROID, LEVOTHROID) 125 MCG tablet Take 1 tablet (125 mcg total) by mouth daily. 30 tablet 3  . lisinopril (PRINIVIL,ZESTRIL) 20 MG tablet Take 1 tablet (20 mg total) by mouth daily. 90 tablet 3  . magnesium oxide (MAG-OX) 400 MG tablet Take 1 tablet by mouth 2 (two) times daily.    . metFORMIN (GLUCOPHAGE) 1000 MG  tablet TAKE 1 TABLET BY MOUTH TWICE A DAY WITH A MEAL 180 tablet 0  . metoCLOPramide (REGLAN) 10 MG tablet TAKE 1 TABLET BY MOUTH 3 TIMES DAILY BEFORE MEALS 90 tablet 5  . metolazone (ZAROXOLYN) 2.5 MG tablet Take 1 tablet (2.5 mg total) by mouth daily as needed (for more than 3lb/day or 5lb/wk weight gain).    . metoprolol succinate (TOPROL-XL) 50 MG 24 hr tablet TAKE ONE TABLET BY MOUTH ONCE DAILY 90 tablet 1  . Multiple Vitamin (MULITIVITAMIN WITH MINERALS) TABS Take 1 tablet by mouth daily.    . nitroGLYCERIN (NITROSTAT) 0.4 MG SL tablet Place 1 tablet (0.4 mg total) under the tongue every 5 (five) minutes as needed for  chest pain. 60 tablet 3  . polyethylene glycol (MIRALAX / GLYCOLAX) packet 17grams in 16 oz of water twice a day until bowel movement.  LAXITIVE.  Restart if two days since last bowel movement 14 each 0  . potassium chloride (K-DUR,KLOR-CON) 10 MEQ tablet Take 10 mEq by mouth 2 (two) times daily.    Marland Kitchen spironolactone (ALDACTONE) 25 MG tablet Take 0.5 tablets by mouth daily.    Marland Kitchen ULTICARE MINI PEN NEEDLES 31G X 6 MM MISC     . vitamin E 400 UNIT capsule Take 400 Units by mouth daily.    Marland Kitchen glimepiride (AMARYL) 2 MG tablet Take 1 tablet (2 mg total) by mouth daily as needed (hyperglycemia).    . Insulin Glargine (BASAGLAR KWIKPEN) 100 UNIT/ML SOPN Inject 0.4 mLs (40 Units total) into the skin 2 (two) times daily. 15 mL 5  . MAGNESIUM PO Take 1 tablet by mouth daily.     No facility-administered medications prior to visit.      Per HPI unless specifically indicated in ROS section below Review of Systems     Objective:    BP 114/62 (BP Location: Left Arm, Patient Position: Sitting, Cuff Size: Large)   Pulse 78   Temp 98.2 F (36.8 C) (Oral)   Ht 5' 10"  (1.778 m)   Wt (!) 310 lb 4 oz (140.7 kg)   SpO2 96%   BMI 44.52 kg/m   Wt Readings from Last 3 Encounters:  01/30/18 (!) 310 lb 4 oz (140.7 kg)  11/20/17 (!) 315 lb (142.9 kg)  11/16/17 (!) 307 lb 8 oz (139.5 kg)    Physical Exam  Constitutional: He appears well-developed and well-nourished. No distress.  HENT:  Right Ear: External ear and ear canal normal. Decreased hearing is noted.  Left Ear: Hearing, tympanic membrane, external ear and ear canal normal.  Mouth/Throat: Oropharynx is clear and moist. No oropharyngeal exudate.  Scarring of inferior R TM  Cardiovascular: Normal rate, regular rhythm and normal heart sounds.  No murmur heard. Pulmonary/Chest: Effort normal and breath sounds normal. No respiratory distress. He has no wheezes. He has no rales.  Musculoskeletal: He exhibits no edema.  Psychiatric: He has a normal mood  and affect.  Nursing note and vitals reviewed.  Results for orders placed or performed in visit on 01/30/18  TSH  Result Value Ref Range   TSH 2.58 0.35 - 4.50 uIU/mL  T4, free  Result Value Ref Range   Free T4 0.92 0.60 - 1.60 ng/dL  Renal function panel  Result Value Ref Range   Sodium 134 (L) 135 - 145 mEq/L   Potassium 3.6 3.5 - 5.1 mEq/L   Chloride 94 (L) 96 - 112 mEq/L   CO2 29 19 - 32 mEq/L   Calcium  9.2 8.4 - 10.5 mg/dL   Albumin 4.5 3.5 - 5.2 g/dL   BUN 16 6 - 23 mg/dL   Creatinine, Ser 1.05 0.40 - 1.50 mg/dL   Glucose, Bld 279 (H) 70 - 99 mg/dL   Phosphorus 3.5 2.3 - 4.6 mg/dL   GFR 75.84 >60.00 mL/min  Hemoglobin A1c  Result Value Ref Range   Hgb A1c MFr Bld 7.6 (H) 4.6 - 6.5 %  Magnesium  Result Value Ref Range   Magnesium 1.5 1.5 - 2.5 mg/dL       Assessment & Plan:   Problem List Items Addressed This Visit    Type 2 diabetes mellitus, uncontrolled, with retinopathy ([place])    He is currently taking basaglar 50u BID. Med list updated.       Relevant Medications   Insulin Glargine (BASAGLAR KWIKPEN) 100 UNIT/ML SOPN   Other Relevant Orders   Renal function panel (Completed)   Hemoglobin A1c (Completed)   Liver cirrhosis secondary to NASH (HCC) (Chronic)    Will need updated imaging likely start with abd [place] next visit.       Hypothyroidism    TFTs were abnormal during hospitalization - ?sick euthyroid - will repeat TFTs today as he's feeling better.       Relevant Orders   TSH (Completed)   T4, free (Completed)   HTN (hypertension) (Chronic)    Chronic, stable today. Continue current regimen.       GERD (gastroesophageal reflux disease)    Severe - continue dexilant 81m daily, discussed administering 30 min prior to largest meal. Will also add pepcid 258mnightly and monitor effect on chest discomfort.      Relevant Medications   magnesium oxide (MAG-OX) 400 MG tablet   famotidine (PEPCID) 20 MG tablet   Coronary artery disease, non-occlusive  (Chronic)    [facility] Latest hospitalization with chest pain that was thought to be non cardiac in nature according to discharge summary.       Chronic diastolic CHF (congestive heart failure) (HCC)    Continue bumex and regular monitoring with [facility]. He was recently found to have low Mg and K - will update today.       Chest pain - Primary    ?fluid overload vs noncardiac cause.  Will trial pepcid nightly.  He will keep f/u cardiology appt next week.  TCM visit not completed today.        Other Visit Diagnoses    Hypomagnesemia       Relevant Orders   Magnesium (Completed)       Meds ordered this encounter  Medications  . Insulin Glargine (BASAGLAR KWIKPEN) 100 UNIT/ML SOPN    Sig: Inject 0.5 mLs (50 Units total) into the skin 2 (two) times daily.    Dispense:  15 mL    Refill:  5  . famotidine (PEPCID) 20 MG tablet    Sig: Take 1 tablet (20 mg total) by mouth at bedtime.    Dispense:  90 tablet    Refill:  1   Orders Placed This Encounter  Procedures  . TSH  . T4, free  . Renal function panel  . Hemoglobin A1c  . Magnesium    Follow up plan: Return in about 3 months (around 05/01/2018) for follow up visit.  JaRia BushMD

## 2018-02-06 DIAGNOSIS — I2510 Atherosclerotic heart disease of native coronary artery without angina pectoris: Secondary | ICD-10-CM | POA: Diagnosis not present

## 2018-02-06 DIAGNOSIS — I5032 Chronic diastolic (congestive) heart failure: Secondary | ICD-10-CM | POA: Diagnosis not present

## 2018-02-06 DIAGNOSIS — R079 Chest pain, unspecified: Secondary | ICD-10-CM | POA: Diagnosis not present

## 2018-02-06 DIAGNOSIS — F329 Major depressive disorder, single episode, unspecified: Secondary | ICD-10-CM | POA: Diagnosis not present

## 2018-02-06 DIAGNOSIS — I10 Essential (primary) hypertension: Secondary | ICD-10-CM | POA: Diagnosis not present

## 2018-02-12 ENCOUNTER — Ambulatory Visit: Payer: Medicare Other | Admitting: Family Medicine

## 2018-02-15 ENCOUNTER — Telehealth: Payer: Self-pay | Admitting: Family Medicine

## 2018-02-15 NOTE — Telephone Encounter (Signed)
plz notify sugar log reviewed.  Fasting 120-170. Continue basaglar 50u bid. No other changes at this time.

## 2018-02-15 NOTE — Telephone Encounter (Signed)
Left message on vm per dpr relaying Dr. Synthia Innocent message and instructions.

## 2018-02-19 ENCOUNTER — Other Ambulatory Visit: Payer: Self-pay | Admitting: Family Medicine

## 2018-02-22 DIAGNOSIS — Z4509 Encounter for adjustment and management of other cardiac device: Secondary | ICD-10-CM | POA: Diagnosis not present

## 2018-02-22 DIAGNOSIS — I5032 Chronic diastolic (congestive) heart failure: Secondary | ICD-10-CM | POA: Diagnosis not present

## 2018-02-23 ENCOUNTER — Other Ambulatory Visit: Payer: Self-pay | Admitting: Family Medicine

## 2018-03-01 ENCOUNTER — Other Ambulatory Visit: Payer: Self-pay | Admitting: Family Medicine

## 2018-03-08 ENCOUNTER — Other Ambulatory Visit: Payer: Self-pay | Admitting: Family Medicine

## 2018-03-28 ENCOUNTER — Telehealth: Payer: Self-pay | Admitting: Family Medicine

## 2018-03-28 DIAGNOSIS — I5030 Unspecified diastolic (congestive) heart failure: Secondary | ICD-10-CM | POA: Diagnosis not present

## 2018-03-28 DIAGNOSIS — I4510 Unspecified right bundle-branch block: Secondary | ICD-10-CM | POA: Diagnosis present

## 2018-03-28 DIAGNOSIS — Z8679 Personal history of other diseases of the circulatory system: Secondary | ICD-10-CM | POA: Diagnosis not present

## 2018-03-28 DIAGNOSIS — R197 Diarrhea, unspecified: Secondary | ICD-10-CM | POA: Diagnosis not present

## 2018-03-28 DIAGNOSIS — R0789 Other chest pain: Secondary | ICD-10-CM | POA: Diagnosis not present

## 2018-03-28 DIAGNOSIS — I5032 Chronic diastolic (congestive) heart failure: Secondary | ICD-10-CM | POA: Diagnosis not present

## 2018-03-28 DIAGNOSIS — K219 Gastro-esophageal reflux disease without esophagitis: Secondary | ICD-10-CM | POA: Diagnosis present

## 2018-03-28 DIAGNOSIS — E785 Hyperlipidemia, unspecified: Secondary | ICD-10-CM | POA: Diagnosis present

## 2018-03-28 DIAGNOSIS — E039 Hypothyroidism, unspecified: Secondary | ICD-10-CM | POA: Diagnosis not present

## 2018-03-28 DIAGNOSIS — G4733 Obstructive sleep apnea (adult) (pediatric): Secondary | ICD-10-CM | POA: Diagnosis present

## 2018-03-28 DIAGNOSIS — K7689 Other specified diseases of liver: Secondary | ICD-10-CM | POA: Diagnosis not present

## 2018-03-28 DIAGNOSIS — Z86711 Personal history of pulmonary embolism: Secondary | ICD-10-CM | POA: Diagnosis not present

## 2018-03-28 DIAGNOSIS — I110 Hypertensive heart disease with heart failure: Secondary | ICD-10-CM | POA: Diagnosis not present

## 2018-03-28 DIAGNOSIS — I2510 Atherosclerotic heart disease of native coronary artery without angina pectoris: Secondary | ICD-10-CM | POA: Diagnosis not present

## 2018-03-28 DIAGNOSIS — Z87891 Personal history of nicotine dependence: Secondary | ICD-10-CM | POA: Diagnosis not present

## 2018-03-28 DIAGNOSIS — Z9049 Acquired absence of other specified parts of digestive tract: Secondary | ICD-10-CM | POA: Diagnosis not present

## 2018-03-28 DIAGNOSIS — Z9884 Bariatric surgery status: Secondary | ICD-10-CM | POA: Diagnosis not present

## 2018-03-28 DIAGNOSIS — I517 Cardiomegaly: Secondary | ICD-10-CM | POA: Diagnosis not present

## 2018-03-28 DIAGNOSIS — Z86718 Personal history of other venous thrombosis and embolism: Secondary | ICD-10-CM | POA: Diagnosis not present

## 2018-03-28 DIAGNOSIS — H9193 Unspecified hearing loss, bilateral: Secondary | ICD-10-CM | POA: Diagnosis present

## 2018-03-28 DIAGNOSIS — Z96659 Presence of unspecified artificial knee joint: Secondary | ICD-10-CM | POA: Diagnosis present

## 2018-03-28 DIAGNOSIS — Z6841 Body Mass Index (BMI) 40.0 and over, adult: Secondary | ICD-10-CM | POA: Diagnosis not present

## 2018-03-28 DIAGNOSIS — R079 Chest pain, unspecified: Secondary | ICD-10-CM | POA: Diagnosis not present

## 2018-03-28 DIAGNOSIS — E119 Type 2 diabetes mellitus without complications: Secondary | ICD-10-CM | POA: Diagnosis not present

## 2018-03-28 DIAGNOSIS — R0602 Shortness of breath: Secondary | ICD-10-CM | POA: Diagnosis not present

## 2018-03-28 NOTE — Telephone Encounter (Signed)
received notice that insurance will no longer cover basaglar - can pt call to see what is preferred long acting insulin now? (placed in Lisa's box)

## 2018-03-29 NOTE — Telephone Encounter (Signed)
Left message on vm per dpr relaying Dr. Synthia Innocent message. Asked pt to call back to let us know what long acting insulin his ins covers.

## 2018-03-30 MED ORDER — GENERIC EXTERNAL MEDICATION
Status: DC
Start: ? — End: 2018-03-30

## 2018-03-30 MED ORDER — ATORVASTATIN CALCIUM 20 MG PO TABS
20.00 | ORAL_TABLET | ORAL | Status: DC
Start: 2018-03-31 — End: 2018-03-30

## 2018-03-30 MED ORDER — METOPROLOL SUCCINATE ER 25 MG PO TB24
50.00 | ORAL_TABLET | ORAL | Status: DC
Start: 2018-03-31 — End: 2018-03-30

## 2018-03-30 MED ORDER — NITROGLYCERIN 0.4 MG SL SUBL
.40 | SUBLINGUAL_TABLET | SUBLINGUAL | Status: DC
Start: ? — End: 2018-03-30

## 2018-03-30 MED ORDER — CLOPIDOGREL BISULFATE 75 MG PO TABS
75.00 | ORAL_TABLET | ORAL | Status: DC
Start: 2018-03-31 — End: 2018-03-30

## 2018-03-30 MED ORDER — ACETAMINOPHEN 325 MG PO TABS
650.00 | ORAL_TABLET | ORAL | Status: DC
Start: ? — End: 2018-03-30

## 2018-03-30 MED ORDER — CITALOPRAM HYDROBROMIDE 20 MG PO TABS
20.00 | ORAL_TABLET | ORAL | Status: DC
Start: 2018-03-31 — End: 2018-03-30

## 2018-03-30 MED ORDER — GENERIC EXTERNAL MEDICATION
16.00 | Status: DC
Start: ? — End: 2018-03-30

## 2018-03-30 MED ORDER — DIAZEPAM 2 MG PO TABS
2.00 | ORAL_TABLET | ORAL | Status: DC
Start: ? — End: 2018-03-30

## 2018-03-30 MED ORDER — MAGNESIUM OXIDE 400 MG PO TABS
400.00 | ORAL_TABLET | ORAL | Status: DC
Start: 2018-03-30 — End: 2018-03-30

## 2018-03-30 MED ORDER — AMLODIPINE BESYLATE 5 MG PO TABS
5.00 | ORAL_TABLET | ORAL | Status: DC
Start: 2018-03-31 — End: 2018-03-30

## 2018-03-30 MED ORDER — GENERIC EXTERNAL MEDICATION
5.00 | Status: DC
Start: ? — End: 2018-03-30

## 2018-03-30 MED ORDER — SPIRONOLACTONE 25 MG PO TABS
12.50 | ORAL_TABLET | ORAL | Status: DC
Start: 2018-03-31 — End: 2018-03-30

## 2018-03-30 MED ORDER — DICLOFENAC SODIUM 1 % TD GEL
2.00 | TRANSDERMAL | Status: DC
Start: 2018-03-30 — End: 2018-03-30

## 2018-03-30 MED ORDER — GENERIC EXTERNAL MEDICATION
125.00 | Status: DC
Start: 2018-03-31 — End: 2018-03-30

## 2018-03-30 MED ORDER — ALBUTEROL SULFATE HFA 108 (90 BASE) MCG/ACT IN AERS
2.00 | INHALATION_SPRAY | RESPIRATORY_TRACT | Status: DC
Start: 2018-03-30 — End: 2018-03-30

## 2018-03-30 MED ORDER — ASPIRIN 81 MG PO CHEW
81.00 | CHEWABLE_TABLET | ORAL | Status: DC
Start: 2018-03-31 — End: 2018-03-30

## 2018-03-30 MED ORDER — GENERIC EXTERNAL MEDICATION
180.00 | Status: DC
Start: 2018-03-30 — End: 2018-03-30

## 2018-03-30 MED ORDER — INSULIN LISPRO 100 UNIT/ML ~~LOC~~ SOLN
0.00 | SUBCUTANEOUS | Status: DC
Start: 2018-03-30 — End: 2018-03-30

## 2018-03-30 MED ORDER — GENERIC EXTERNAL MEDICATION
8.00 | Status: DC
Start: ? — End: 2018-03-30

## 2018-03-30 MED ORDER — METOCLOPRAMIDE HCL 10 MG PO TABS
10.00 | ORAL_TABLET | ORAL | Status: DC
Start: 2018-03-30 — End: 2018-03-30

## 2018-03-30 MED ORDER — BUMETANIDE 1 MG PO TABS
2.00 | ORAL_TABLET | ORAL | Status: DC
Start: 2018-03-31 — End: 2018-03-30

## 2018-03-30 MED ORDER — LISINOPRIL 20 MG PO TABS
20.00 | ORAL_TABLET | ORAL | Status: DC
Start: 2018-03-31 — End: 2018-03-30

## 2018-03-30 MED ORDER — PANTOPRAZOLE SODIUM 20 MG PO TBEC
40.00 | DELAYED_RELEASE_TABLET | ORAL | Status: DC
Start: 2018-03-31 — End: 2018-03-30

## 2018-03-30 MED ORDER — INSULIN GLARGINE 100 UNIT/ML ~~LOC~~ SOLN
45.00 | SUBCUTANEOUS | Status: DC
Start: 2018-03-30 — End: 2018-03-30

## 2018-03-30 NOTE — Telephone Encounter (Signed)
Left message on vm per dpr relaying Dr. Synthia Innocent message. Asked pt to call back to let us know what long acting insulin his ins covers.

## 2018-04-02 NOTE — Telephone Encounter (Signed)
Dr. Damita Dunnings, would you be able to send a Lantus rx for Dr. Synthia [name] pt, in place of the [place], to [facility]?

## 2018-04-02 NOTE — Telephone Encounter (Signed)
[name] said insurance will cover Lantis.  It's in the same family as what patient's taking now. Patient uses [employer].

## 2018-04-02 NOTE — Telephone Encounter (Signed)
Spoke with pt's wife, [name] Pulse (on dpr) about Dr. Synthia Innocent message.  States she did get my message and will call the ins co today and call back with the info.

## 2018-04-03 DIAGNOSIS — I5032 Chronic diastolic (congestive) heart failure: Secondary | ICD-10-CM | POA: Diagnosis not present

## 2018-04-03 DIAGNOSIS — I2510 Atherosclerotic heart disease of native coronary artery without angina pectoris: Secondary | ICD-10-CM | POA: Diagnosis not present

## 2018-04-03 DIAGNOSIS — Z9081 Acquired absence of spleen: Secondary | ICD-10-CM | POA: Diagnosis not present

## 2018-04-03 DIAGNOSIS — K219 Gastro-esophageal reflux disease without esophagitis: Secondary | ICD-10-CM | POA: Diagnosis not present

## 2018-04-03 DIAGNOSIS — Z7982 Long term (current) use of aspirin: Secondary | ICD-10-CM | POA: Diagnosis not present

## 2018-04-03 DIAGNOSIS — H9193 Unspecified hearing loss, bilateral: Secondary | ICD-10-CM | POA: Diagnosis not present

## 2018-04-03 DIAGNOSIS — E119 Type 2 diabetes mellitus without complications: Secondary | ICD-10-CM | POA: Diagnosis not present

## 2018-04-03 DIAGNOSIS — E785 Hyperlipidemia, unspecified: Secondary | ICD-10-CM | POA: Diagnosis not present

## 2018-04-03 DIAGNOSIS — Z6841 Body Mass Index (BMI) 40.0 and over, adult: Secondary | ICD-10-CM | POA: Diagnosis not present

## 2018-04-03 DIAGNOSIS — E669 Obesity, unspecified: Secondary | ICD-10-CM | POA: Diagnosis not present

## 2018-04-03 DIAGNOSIS — G4733 Obstructive sleep apnea (adult) (pediatric): Secondary | ICD-10-CM | POA: Diagnosis not present

## 2018-04-03 DIAGNOSIS — M75100 Unspecified rotator cuff tear or rupture of unspecified shoulder, not specified as traumatic: Secondary | ICD-10-CM | POA: Diagnosis not present

## 2018-04-03 DIAGNOSIS — Z9049 Acquired absence of other specified parts of digestive tract: Secondary | ICD-10-CM | POA: Diagnosis not present

## 2018-04-03 DIAGNOSIS — I10 Essential (primary) hypertension: Secondary | ICD-10-CM | POA: Diagnosis not present

## 2018-04-03 DIAGNOSIS — R079 Chest pain, unspecified: Secondary | ICD-10-CM | POA: Diagnosis not present

## 2018-04-03 DIAGNOSIS — Z794 Long term (current) use of insulin: Secondary | ICD-10-CM | POA: Diagnosis not present

## 2018-04-03 DIAGNOSIS — Z7902 Long term (current) use of antithrombotics/antiplatelets: Secondary | ICD-10-CM | POA: Diagnosis not present

## 2018-04-03 DIAGNOSIS — I110 Hypertensive heart disease with heart failure: Secondary | ICD-10-CM | POA: Diagnosis not present

## 2018-04-03 DIAGNOSIS — K7581 Nonalcoholic steatohepatitis (NASH): Secondary | ICD-10-CM | POA: Diagnosis not present

## 2018-04-03 DIAGNOSIS — E039 Hypothyroidism, unspecified: Secondary | ICD-10-CM | POA: Diagnosis not present

## 2018-04-03 DIAGNOSIS — Z9884 Bariatric surgery status: Secondary | ICD-10-CM | POA: Diagnosis not present

## 2018-04-03 MED ORDER — INSULIN GLARGINE 100 UNIT/ML SOLOSTAR PEN: 50.0000 [IU] | PEN_INJECTOR | Freq: Two times a day (BID) | SUBCUTANEOUS | 2 refills | Status: DC

## 2018-04-03 NOTE — Addendum Note (Signed)
Addended by: Tonia Ghent on: 04/03/2018 06:20 AM   Modules accepted: Orders

## 2018-04-03 NOTE — Telephone Encounter (Addendum)
Noted.  Fyi to Dr. G.  

## 2018-04-03 NOTE — Telephone Encounter (Signed)
Sent. Thanks.   

## 2018-04-04 DIAGNOSIS — E119 Type 2 diabetes mellitus without complications: Secondary | ICD-10-CM | POA: Diagnosis not present

## 2018-04-04 DIAGNOSIS — E785 Hyperlipidemia, unspecified: Secondary | ICD-10-CM | POA: Diagnosis not present

## 2018-04-04 DIAGNOSIS — I110 Hypertensive heart disease with heart failure: Secondary | ICD-10-CM | POA: Diagnosis not present

## 2018-04-04 DIAGNOSIS — G4733 Obstructive sleep apnea (adult) (pediatric): Secondary | ICD-10-CM | POA: Diagnosis not present

## 2018-04-04 DIAGNOSIS — I2510 Atherosclerotic heart disease of native coronary artery without angina pectoris: Secondary | ICD-10-CM | POA: Diagnosis not present

## 2018-04-04 DIAGNOSIS — I5032 Chronic diastolic (congestive) heart failure: Secondary | ICD-10-CM | POA: Diagnosis not present

## 2018-04-05 ENCOUNTER — Telehealth: Payer: Self-pay | Admitting: *Deleted

## 2018-04-05 DIAGNOSIS — E785 Hyperlipidemia, unspecified: Secondary | ICD-10-CM | POA: Diagnosis not present

## 2018-04-05 DIAGNOSIS — E119 Type 2 diabetes mellitus without complications: Secondary | ICD-10-CM | POA: Diagnosis not present

## 2018-04-05 DIAGNOSIS — G4733 Obstructive sleep apnea (adult) (pediatric): Secondary | ICD-10-CM | POA: Diagnosis not present

## 2018-04-05 DIAGNOSIS — I5032 Chronic diastolic (congestive) heart failure: Secondary | ICD-10-CM | POA: Diagnosis not present

## 2018-04-05 DIAGNOSIS — I110 Hypertensive heart disease with heart failure: Secondary | ICD-10-CM | POA: Diagnosis not present

## 2018-04-05 DIAGNOSIS — I2510 Atherosclerotic heart disease of native coronary artery without angina pectoris: Secondary | ICD-10-CM | POA: Diagnosis not present

## 2018-04-05 NOTE — Telephone Encounter (Signed)
Rx written and in Lisa's box.  

## 2018-04-05 NOTE — Telephone Encounter (Signed)
Faxed order to [facility].

## 2018-04-05 NOTE — Telephone Encounter (Signed)
Spoke to [place] with Chad [name] is requesting orders for a bariatric tub bench, and a bariatric bedside commode. Orders can be faxed to [facility] at [phone]. It must include his Dx and most recent weight on the order. pls advise.

## 2018-04-06 ENCOUNTER — Ambulatory Visit (INDEPENDENT_AMBULATORY_CARE_PROVIDER_SITE_OTHER): Payer: Medicare Other | Admitting: Family Medicine

## 2018-04-06 ENCOUNTER — Encounter: Payer: Self-pay | Admitting: Family Medicine

## 2018-04-06 ENCOUNTER — Telehealth: Payer: Self-pay | Admitting: *Deleted

## 2018-04-06 VITALS — BP 128/58 | HR 79 | Temp 97.7°F | Ht 70.0 in | Wt 329.1 lb

## 2018-04-06 DIAGNOSIS — I110 Hypertensive heart disease with heart failure: Secondary | ICD-10-CM | POA: Diagnosis not present

## 2018-04-06 DIAGNOSIS — R0602 Shortness of breath: Secondary | ICD-10-CM

## 2018-04-06 DIAGNOSIS — R079 Chest pain, unspecified: Secondary | ICD-10-CM

## 2018-04-06 DIAGNOSIS — I2510 Atherosclerotic heart disease of native coronary artery without angina pectoris: Secondary | ICD-10-CM | POA: Diagnosis not present

## 2018-04-06 DIAGNOSIS — G4733 Obstructive sleep apnea (adult) (pediatric): Secondary | ICD-10-CM | POA: Diagnosis not present

## 2018-04-06 DIAGNOSIS — I5032 Chronic diastolic (congestive) heart failure: Secondary | ICD-10-CM | POA: Diagnosis not present

## 2018-04-06 DIAGNOSIS — E119 Type 2 diabetes mellitus without complications: Secondary | ICD-10-CM | POA: Diagnosis not present

## 2018-04-06 DIAGNOSIS — E785 Hyperlipidemia, unspecified: Secondary | ICD-10-CM | POA: Diagnosis not present

## 2018-04-06 LAB — BASIC METABOLIC PANEL
BUN: 18 mg/dL (ref 6–23)
CO2: 26 mEq/L (ref 19–32)
Calcium: 9.1 mg/dL (ref 8.4–10.5)
Chloride: 95 mEq/L — ABNORMAL LOW (ref 96–112)
Creatinine, Ser: 1.02 mg/dL (ref 0.40–1.50)
GFR: 73.74 mL/min (ref >60.00)
Glucose, Bld: 295 mg/dL — ABNORMAL HIGH (ref 70–99)
Potassium: 3.8 mEq/L (ref 3.5–5.1)
Sodium: 134 mEq/L — ABNORMAL LOW (ref 135–145)

## 2018-04-06 LAB — BRAIN NATRIURETIC PEPTIDE: Pro B Natriuretic peptide (BNP): 18 pg/mL (ref 0.0–100.0)

## 2018-04-06 MED ORDER — DIAZEPAM 2 MG PO TABS: 1.0000 mg | ORAL_TABLET | Freq: Three times a day (TID) | ORAL | Status: DC | PRN

## 2018-04-06 NOTE — Telephone Encounter (Signed)
Please give the order.  Thanks.

## 2018-04-06 NOTE — Progress Notes (Signed)
Inpatient f/u.    Cardiac history noted.  Presented with chest pain.  Inpatient course and work-up discussed with patient at office visit.  He was eventually thought to have noncardiac cause of chest pain.  His pain got better after taking valium as in patient.  He occ has the same pain now but clearly improved from prev.  Pain was in the left chest wall.  His weight is up from dry weight.  He is puffy, can feel it in his hands with a grip.  He has inc in weight on home scales.  He has taken 1 bumex today, hasn't yet taken a second dose today.  He usually get a call from cardiology to direct his diuretics.  He has SOB with exertion.  He is using walker at baseline.  He doesn't add salt, d/w pt, he is tracking salt intake.    He says his dry weight is 313 pounds.  He weighs 329 at the office visit today.  He attributed the 16 pound change to clothing.  I told him this was unlikely, discussed.  He has been able to tolerate statin, d/w pt.    HH is coming to the house now, PT, OT, Therapist, sports.   TSH was minimally elevated.  D/w pt.  I wouldn't change tx at this point.    PMH and SH reviewed  ROS: Per HPI unless specifically indicated in ROS section   Meds, vitals, and allergies reviewed.   GEN: nad, alert and oriented HEENT: mucous membranes moist NECK: supple w/o LA CV: rrr. PULM: ctab, no inc wob ABD: soft, +bs EXT: no BLE edema SKIN: No rash on chest wall.

## 2018-04-06 NOTE — Telephone Encounter (Signed)
Spoke to [place] with [facility] who is requesting PT 2x/wk for 6wks. pls advise

## 2018-04-06 NOTE — Telephone Encounter (Signed)
Left detailed message on voicemail of [name] with [facility].

## 2018-04-06 NOTE — Patient Instructions (Addendum)
Go to the lab on the way out.  We'll contact you with your lab report. If you don't get a call from cardiology, then go ahead and take a second dose of bumex.  You can try taking a half tab of valium if needed.  Take care.

## 2018-04-08 NOTE — Assessment & Plan Note (Addendum)
With cardiac history noted but was found to have likely noncardiac chest pain with recent event.  He is improved in the meantime.  He can use Valium as needed.  Med list updated.  Has previous prescription from inpatient stay.  The issue about his weight remains, he is above his dry weight.  He has not yet taken a second dose of Bumex today.  His hands feel puffy with grip.  He likely still needs additional outpatient diuresis.  Reasonable to check routine labs today and take a second dose of Bumex unless he gets a call from cardiology instructing him to do otherwise.  Discussed with patient.  He understood.  Advised patient that in the future if he has new onset chest pain that is suggestive of cardiac source, or if he has emergent symptoms, then he needs to be seen at the emergency room and not come in for an outpatient appointment.  >25 minutes spent in face to face time with patient, >50% spent in counselling or coordination of care.  Routed to PCP as FYI.

## 2018-04-10 DIAGNOSIS — I5032 Chronic diastolic (congestive) heart failure: Secondary | ICD-10-CM | POA: Diagnosis not present

## 2018-04-10 DIAGNOSIS — I2510 Atherosclerotic heart disease of native coronary artery without angina pectoris: Secondary | ICD-10-CM | POA: Diagnosis not present

## 2018-04-10 DIAGNOSIS — I110 Hypertensive heart disease with heart failure: Secondary | ICD-10-CM | POA: Diagnosis not present

## 2018-04-10 DIAGNOSIS — E119 Type 2 diabetes mellitus without complications: Secondary | ICD-10-CM | POA: Diagnosis not present

## 2018-04-10 DIAGNOSIS — E785 Hyperlipidemia, unspecified: Secondary | ICD-10-CM | POA: Diagnosis not present

## 2018-04-10 DIAGNOSIS — G4733 Obstructive sleep apnea (adult) (pediatric): Secondary | ICD-10-CM | POA: Diagnosis not present

## 2018-04-11 DIAGNOSIS — E785 Hyperlipidemia, unspecified: Secondary | ICD-10-CM | POA: Diagnosis not present

## 2018-04-11 DIAGNOSIS — E119 Type 2 diabetes mellitus without complications: Secondary | ICD-10-CM | POA: Diagnosis not present

## 2018-04-11 DIAGNOSIS — G4733 Obstructive sleep apnea (adult) (pediatric): Secondary | ICD-10-CM | POA: Diagnosis not present

## 2018-04-11 DIAGNOSIS — I2510 Atherosclerotic heart disease of native coronary artery without angina pectoris: Secondary | ICD-10-CM | POA: Diagnosis not present

## 2018-04-11 DIAGNOSIS — I5032 Chronic diastolic (congestive) heart failure: Secondary | ICD-10-CM | POA: Diagnosis not present

## 2018-04-11 DIAGNOSIS — I110 Hypertensive heart disease with heart failure: Secondary | ICD-10-CM | POA: Diagnosis not present

## 2018-04-12 DIAGNOSIS — E785 Hyperlipidemia, unspecified: Secondary | ICD-10-CM | POA: Diagnosis not present

## 2018-04-12 DIAGNOSIS — I5032 Chronic diastolic (congestive) heart failure: Secondary | ICD-10-CM | POA: Diagnosis not present

## 2018-04-12 DIAGNOSIS — E119 Type 2 diabetes mellitus without complications: Secondary | ICD-10-CM | POA: Diagnosis not present

## 2018-04-12 DIAGNOSIS — I2510 Atherosclerotic heart disease of native coronary artery without angina pectoris: Secondary | ICD-10-CM | POA: Diagnosis not present

## 2018-04-12 DIAGNOSIS — G4733 Obstructive sleep apnea (adult) (pediatric): Secondary | ICD-10-CM | POA: Diagnosis not present

## 2018-04-12 DIAGNOSIS — I110 Hypertensive heart disease with heart failure: Secondary | ICD-10-CM | POA: Diagnosis not present

## 2018-04-13 DIAGNOSIS — I110 Hypertensive heart disease with heart failure: Secondary | ICD-10-CM | POA: Diagnosis not present

## 2018-04-13 DIAGNOSIS — I2510 Atherosclerotic heart disease of native coronary artery without angina pectoris: Secondary | ICD-10-CM | POA: Diagnosis not present

## 2018-04-13 DIAGNOSIS — E119 Type 2 diabetes mellitus without complications: Secondary | ICD-10-CM | POA: Diagnosis not present

## 2018-04-13 DIAGNOSIS — I5032 Chronic diastolic (congestive) heart failure: Secondary | ICD-10-CM | POA: Diagnosis not present

## 2018-04-13 DIAGNOSIS — G4733 Obstructive sleep apnea (adult) (pediatric): Secondary | ICD-10-CM | POA: Diagnosis not present

## 2018-04-13 DIAGNOSIS — E785 Hyperlipidemia, unspecified: Secondary | ICD-10-CM | POA: Diagnosis not present

## 2018-04-17 DIAGNOSIS — I110 Hypertensive heart disease with heart failure: Secondary | ICD-10-CM | POA: Diagnosis not present

## 2018-04-17 DIAGNOSIS — G4733 Obstructive sleep apnea (adult) (pediatric): Secondary | ICD-10-CM | POA: Diagnosis not present

## 2018-04-17 DIAGNOSIS — I5032 Chronic diastolic (congestive) heart failure: Secondary | ICD-10-CM | POA: Diagnosis not present

## 2018-04-17 DIAGNOSIS — E119 Type 2 diabetes mellitus without complications: Secondary | ICD-10-CM | POA: Diagnosis not present

## 2018-04-17 DIAGNOSIS — E785 Hyperlipidemia, unspecified: Secondary | ICD-10-CM | POA: Diagnosis not present

## 2018-04-17 DIAGNOSIS — I2510 Atherosclerotic heart disease of native coronary artery without angina pectoris: Secondary | ICD-10-CM | POA: Diagnosis not present

## 2018-04-18 DIAGNOSIS — E119 Type 2 diabetes mellitus without complications: Secondary | ICD-10-CM | POA: Diagnosis not present

## 2018-04-18 DIAGNOSIS — G4733 Obstructive sleep apnea (adult) (pediatric): Secondary | ICD-10-CM | POA: Diagnosis not present

## 2018-04-18 DIAGNOSIS — I110 Hypertensive heart disease with heart failure: Secondary | ICD-10-CM | POA: Diagnosis not present

## 2018-04-18 DIAGNOSIS — I2510 Atherosclerotic heart disease of native coronary artery without angina pectoris: Secondary | ICD-10-CM | POA: Diagnosis not present

## 2018-04-18 DIAGNOSIS — I5032 Chronic diastolic (congestive) heart failure: Secondary | ICD-10-CM | POA: Diagnosis not present

## 2018-04-18 DIAGNOSIS — E785 Hyperlipidemia, unspecified: Secondary | ICD-10-CM | POA: Diagnosis not present

## 2018-04-19 DIAGNOSIS — I5032 Chronic diastolic (congestive) heart failure: Secondary | ICD-10-CM | POA: Diagnosis not present

## 2018-04-19 DIAGNOSIS — I110 Hypertensive heart disease with heart failure: Secondary | ICD-10-CM | POA: Diagnosis not present

## 2018-04-19 DIAGNOSIS — G4733 Obstructive sleep apnea (adult) (pediatric): Secondary | ICD-10-CM | POA: Diagnosis not present

## 2018-04-19 DIAGNOSIS — E785 Hyperlipidemia, unspecified: Secondary | ICD-10-CM | POA: Diagnosis not present

## 2018-04-19 DIAGNOSIS — I2510 Atherosclerotic heart disease of native coronary artery without angina pectoris: Secondary | ICD-10-CM | POA: Diagnosis not present

## 2018-04-19 DIAGNOSIS — E119 Type 2 diabetes mellitus without complications: Secondary | ICD-10-CM | POA: Diagnosis not present

## 2018-04-20 DIAGNOSIS — Z7902 Long term (current) use of antithrombotics/antiplatelets: Secondary | ICD-10-CM

## 2018-04-20 DIAGNOSIS — K7581 Nonalcoholic steatohepatitis (NASH): Secondary | ICD-10-CM | POA: Diagnosis not present

## 2018-04-20 DIAGNOSIS — E669 Obesity, unspecified: Secondary | ICD-10-CM | POA: Diagnosis not present

## 2018-04-20 DIAGNOSIS — E119 Type 2 diabetes mellitus without complications: Secondary | ICD-10-CM | POA: Diagnosis not present

## 2018-04-20 DIAGNOSIS — Z794 Long term (current) use of insulin: Secondary | ICD-10-CM

## 2018-04-20 DIAGNOSIS — H9193 Unspecified hearing loss, bilateral: Secondary | ICD-10-CM | POA: Diagnosis not present

## 2018-04-20 DIAGNOSIS — Z6841 Body Mass Index (BMI) 40.0 and over, adult: Secondary | ICD-10-CM

## 2018-04-20 DIAGNOSIS — E039 Hypothyroidism, unspecified: Secondary | ICD-10-CM | POA: Diagnosis not present

## 2018-04-20 DIAGNOSIS — E785 Hyperlipidemia, unspecified: Secondary | ICD-10-CM | POA: Diagnosis not present

## 2018-04-20 DIAGNOSIS — Z7982 Long term (current) use of aspirin: Secondary | ICD-10-CM

## 2018-04-20 DIAGNOSIS — I5032 Chronic diastolic (congestive) heart failure: Secondary | ICD-10-CM | POA: Diagnosis not present

## 2018-04-20 DIAGNOSIS — I110 Hypertensive heart disease with heart failure: Secondary | ICD-10-CM | POA: Diagnosis not present

## 2018-04-20 DIAGNOSIS — Z9049 Acquired absence of other specified parts of digestive tract: Secondary | ICD-10-CM

## 2018-04-20 DIAGNOSIS — G4733 Obstructive sleep apnea (adult) (pediatric): Secondary | ICD-10-CM | POA: Diagnosis not present

## 2018-04-20 DIAGNOSIS — M75100 Unspecified rotator cuff tear or rupture of unspecified shoulder, not specified as traumatic: Secondary | ICD-10-CM | POA: Diagnosis not present

## 2018-04-20 DIAGNOSIS — K219 Gastro-esophageal reflux disease without esophagitis: Secondary | ICD-10-CM | POA: Diagnosis not present

## 2018-04-20 DIAGNOSIS — Z9081 Acquired absence of spleen: Secondary | ICD-10-CM

## 2018-04-20 DIAGNOSIS — I2510 Atherosclerotic heart disease of native coronary artery without angina pectoris: Secondary | ICD-10-CM | POA: Diagnosis not present

## 2018-04-20 DIAGNOSIS — Z9884 Bariatric surgery status: Secondary | ICD-10-CM

## 2018-04-23 ENCOUNTER — Telehealth: Payer: Self-pay | Admitting: Family Medicine

## 2018-04-23 ENCOUNTER — Other Ambulatory Visit: Payer: Self-pay | Admitting: Family Medicine

## 2018-04-23 NOTE — Telephone Encounter (Signed)
New Rx written and in Lisa's box.  °

## 2018-04-23 NOTE — Telephone Encounter (Signed)
Dr Danise Mina requested a bariatric bedside commode.  Patient doesn't qualify for a bariatric bedside commode because he doesn't weigh over 350.  They need an order for a regular commode. The regular bedside commode goes up to 350.

## 2018-04-24 DIAGNOSIS — H34812 Central retinal vein occlusion, left eye, with macular edema: Secondary | ICD-10-CM | POA: Diagnosis not present

## 2018-04-24 DIAGNOSIS — H3562 Retinal hemorrhage, left eye: Secondary | ICD-10-CM | POA: Diagnosis not present

## 2018-04-24 DIAGNOSIS — E113291 Type 2 diabetes mellitus with mild nonproliferative diabetic retinopathy without macular edema, right eye: Secondary | ICD-10-CM | POA: Diagnosis not present

## 2018-04-24 DIAGNOSIS — H31012 Macula scars of posterior pole (postinflammatory) (post-traumatic), left eye: Secondary | ICD-10-CM | POA: Diagnosis not present

## 2018-04-24 NOTE — Telephone Encounter (Signed)
Attempted to call [phone] (phn # provided with message) to get a fax number so order may be faxed.  However, I keep getting different messages with each attempt and cannot speak to someone.   Left message on vm per dpr asking pt/wife if they have a fax or phn # so we can send order.

## 2018-04-25 DIAGNOSIS — E119 Type 2 diabetes mellitus without complications: Secondary | ICD-10-CM | POA: Diagnosis not present

## 2018-04-25 DIAGNOSIS — I2510 Atherosclerotic heart disease of native coronary artery without angina pectoris: Secondary | ICD-10-CM | POA: Diagnosis not present

## 2018-04-25 DIAGNOSIS — I5032 Chronic diastolic (congestive) heart failure: Secondary | ICD-10-CM | POA: Diagnosis not present

## 2018-04-25 DIAGNOSIS — Z634 Disappearance and death of family member: Secondary | ICD-10-CM | POA: Diagnosis not present

## 2018-04-25 DIAGNOSIS — I110 Hypertensive heart disease with heart failure: Secondary | ICD-10-CM | POA: Diagnosis not present

## 2018-04-25 DIAGNOSIS — G4733 Obstructive sleep apnea (adult) (pediatric): Secondary | ICD-10-CM | POA: Diagnosis not present

## 2018-04-25 DIAGNOSIS — E785 Hyperlipidemia, unspecified: Secondary | ICD-10-CM | POA: Diagnosis not present

## 2018-04-25 DIAGNOSIS — F418 Other specified anxiety disorders: Secondary | ICD-10-CM | POA: Diagnosis not present

## 2018-04-25 NOTE — Telephone Encounter (Signed)
Need orders for the bedside commode. Fax to [phone]

## 2018-04-25 NOTE — Telephone Encounter (Signed)
Faxed order to [phone] as requested.

## 2018-04-26 DIAGNOSIS — I110 Hypertensive heart disease with heart failure: Secondary | ICD-10-CM | POA: Diagnosis not present

## 2018-04-26 DIAGNOSIS — E119 Type 2 diabetes mellitus without complications: Secondary | ICD-10-CM | POA: Diagnosis not present

## 2018-04-26 DIAGNOSIS — I5032 Chronic diastolic (congestive) heart failure: Secondary | ICD-10-CM | POA: Diagnosis not present

## 2018-04-26 DIAGNOSIS — I2510 Atherosclerotic heart disease of native coronary artery without angina pectoris: Secondary | ICD-10-CM | POA: Diagnosis not present

## 2018-04-26 DIAGNOSIS — E785 Hyperlipidemia, unspecified: Secondary | ICD-10-CM | POA: Diagnosis not present

## 2018-04-26 DIAGNOSIS — G4733 Obstructive sleep apnea (adult) (pediatric): Secondary | ICD-10-CM | POA: Diagnosis not present

## 2018-04-27 DIAGNOSIS — E119 Type 2 diabetes mellitus without complications: Secondary | ICD-10-CM | POA: Diagnosis not present

## 2018-04-27 DIAGNOSIS — I2510 Atherosclerotic heart disease of native coronary artery without angina pectoris: Secondary | ICD-10-CM | POA: Diagnosis not present

## 2018-04-27 DIAGNOSIS — I110 Hypertensive heart disease with heart failure: Secondary | ICD-10-CM | POA: Diagnosis not present

## 2018-04-27 DIAGNOSIS — G4733 Obstructive sleep apnea (adult) (pediatric): Secondary | ICD-10-CM | POA: Diagnosis not present

## 2018-04-27 DIAGNOSIS — E785 Hyperlipidemia, unspecified: Secondary | ICD-10-CM | POA: Diagnosis not present

## 2018-04-27 DIAGNOSIS — I5032 Chronic diastolic (congestive) heart failure: Secondary | ICD-10-CM | POA: Diagnosis not present

## 2018-04-30 DIAGNOSIS — E785 Hyperlipidemia, unspecified: Secondary | ICD-10-CM | POA: Diagnosis not present

## 2018-04-30 DIAGNOSIS — I5032 Chronic diastolic (congestive) heart failure: Secondary | ICD-10-CM | POA: Diagnosis not present

## 2018-04-30 DIAGNOSIS — G4733 Obstructive sleep apnea (adult) (pediatric): Secondary | ICD-10-CM | POA: Diagnosis not present

## 2018-04-30 DIAGNOSIS — I110 Hypertensive heart disease with heart failure: Secondary | ICD-10-CM | POA: Diagnosis not present

## 2018-04-30 DIAGNOSIS — E119 Type 2 diabetes mellitus without complications: Secondary | ICD-10-CM | POA: Diagnosis not present

## 2018-04-30 DIAGNOSIS — I2510 Atherosclerotic heart disease of native coronary artery without angina pectoris: Secondary | ICD-10-CM | POA: Diagnosis not present

## 2018-05-01 ENCOUNTER — Telehealth: Payer: Self-pay | Admitting: Family Medicine

## 2018-05-01 ENCOUNTER — Encounter: Payer: Self-pay | Admitting: Family Medicine

## 2018-05-01 ENCOUNTER — Ambulatory Visit (INDEPENDENT_AMBULATORY_CARE_PROVIDER_SITE_OTHER): Payer: Medicare Other | Admitting: Family Medicine

## 2018-05-01 VITALS — BP 120/64 | HR 75 | Temp 98.2°F | Ht 70.0 in | Wt 322.0 lb

## 2018-05-01 DIAGNOSIS — E1165 Type 2 diabetes mellitus with hyperglycemia: Secondary | ICD-10-CM | POA: Diagnosis not present

## 2018-05-01 DIAGNOSIS — E785 Hyperlipidemia, unspecified: Secondary | ICD-10-CM | POA: Diagnosis not present

## 2018-05-01 DIAGNOSIS — F331 Major depressive disorder, recurrent, moderate: Secondary | ICD-10-CM | POA: Insufficient documentation

## 2018-05-01 DIAGNOSIS — I110 Hypertensive heart disease with heart failure: Secondary | ICD-10-CM | POA: Diagnosis not present

## 2018-05-01 DIAGNOSIS — E11319 Type 2 diabetes mellitus with unspecified diabetic retinopathy without macular edema: Secondary | ICD-10-CM

## 2018-05-01 DIAGNOSIS — F418 Other specified anxiety disorders: Secondary | ICD-10-CM | POA: Diagnosis not present

## 2018-05-01 DIAGNOSIS — I5032 Chronic diastolic (congestive) heart failure: Secondary | ICD-10-CM

## 2018-05-01 DIAGNOSIS — E119 Type 2 diabetes mellitus without complications: Secondary | ICD-10-CM | POA: Diagnosis not present

## 2018-05-01 DIAGNOSIS — K7581 Nonalcoholic steatohepatitis (NASH): Secondary | ICD-10-CM | POA: Diagnosis not present

## 2018-05-01 DIAGNOSIS — R079 Chest pain, unspecified: Secondary | ICD-10-CM

## 2018-05-01 DIAGNOSIS — G4733 Obstructive sleep apnea (adult) (pediatric): Secondary | ICD-10-CM

## 2018-05-01 DIAGNOSIS — IMO0002 Reserved for concepts with insufficient information to code with codable children: Secondary | ICD-10-CM

## 2018-05-01 DIAGNOSIS — K7460 Unspecified cirrhosis of liver: Secondary | ICD-10-CM

## 2018-05-01 DIAGNOSIS — I2510 Atherosclerotic heart disease of native coronary artery without angina pectoris: Secondary | ICD-10-CM | POA: Diagnosis not present

## 2018-05-01 LAB — POCT GLYCOSYLATED HEMOGLOBIN (HGB A1C): Hemoglobin A1C: 8.5 % — AB (ref 4.0–5.6)

## 2018-05-01 NOTE — Assessment & Plan Note (Addendum)
Does not tolerate, has refused CPAP mask.

## 2018-05-01 NOTE — Assessment & Plan Note (Addendum)
Continue lantus 50u BID and metformin 1015m bid.  A1c elevated - will work towards diabetic diet to improve glycemic control.  Discussed weekly GLP1 RA injectables as he would likely benefit from this - he will check on what type of thyroid cancer his mother had. If unable to find out, will increase insulin and discuss possible endo referral.

## 2018-05-01 NOTE — Assessment & Plan Note (Addendum)
Difficult situation - anticipate CHF and microvascular ischemia leading to recurrent chest pains however seems to be on maximal medical therapy for this already. He is already on high dose imdur (151m daily).  Again today with exertional chest discomfort that improves with SL nitro. EKG updated today.  ?ranexa benefit - will recommend he touch base with cardiology about this.

## 2018-05-01 NOTE — Assessment & Plan Note (Addendum)
Update liver US. Will need labs next visit. Concern for cirrhosis by prior CT (nodular liver) 2017

## 2018-05-01 NOTE — Assessment & Plan Note (Signed)
May need increased bumex with recent weights, awaiting [facility] RN call for directions.

## 2018-05-01 NOTE — Assessment & Plan Note (Addendum)
Followed by [facility] cardiology, difficult situation with ongoing chest pain symptoms.  S/p LAD stent, patent on last LHC 03/2017, reassuring PET perfusion scan 01/2018.  Already on high dose imdur and continues SL nitro use PRN.  ?ranexa benefit.

## 2018-05-01 NOTE — Telephone Encounter (Signed)
plz call - how is he doing today after yesterday's visit? I'd like to offer endo referral to help manage diabetes.

## 2018-05-01 NOTE — Assessment & Plan Note (Signed)
Dry weight seems closer to 310 lbs.

## 2018-05-01 NOTE — Assessment & Plan Note (Addendum)
Appreciate psych care - tapering off celexa, started on lorazepam 0.36m 1/2-1 BID PRN in place of valium.

## 2018-05-01 NOTE — Progress Notes (Signed)
BP 120/64 (BP Location: Left Arm, Patient Position: Sitting, Cuff Size: Large)   Pulse 75   Temp 98.2 F (36.8 C) (Oral)   Ht 5' 10"  (1.778 m)   Wt (!) 322 lb (146.1 kg)   SpO2 98%   BMI 46.20 kg/m    CC: 3 mo f/u visit Subjective:    Patient ID: Chad [name], male    DOB: [birth year], 63 y.o.   MRN: [id number]  HPI: Chad [name] is a 63 y.o. male presenting on 05/01/2018 for Follow-up (Here for 3 mo f/u. Pt states [place] visited this morning and heard gurgling in pt's chest. Told to mention to doc. Took nitro tab before OV. Pt accompanied by his wife, [name] Pulse. )   DM - does regularly check sugars - sugar log since 01/2018 reviewed - 140-200 fasting readings. Compliant with antihyperglycemic regimen which includes: lantus 50u bid, metformin 101m bid. Denies low sugars or hypoglycemic symptoms. Denies paresthesias. Last diabetic eye exam 11/2017. Pneumovax: 2013. Prevnar: not due yet. Glucometer brand: one-touch. DSME: completed.  Lab Results  Component Value Date   HGBA1C 8.5 (A) 05/01/2018   Diabetic Foot Exam - Simple   No data filed     Lab Results  Component Value Date   MICROALBUR <0.7 09/15/2015    Liver cirrhosis due to NASH - needs [place].   Ongoing chest pain, dyspnea.  Took nitroglycerin this morning prior to OV.  Cards appt next month.  HH SN, PT, OT coming out to the house.  Taking bumex 2102mBID. Goal dry weight is 310lbs.  Receives call regularly from [facility] to titrate bumex.   GERD - last visit we added pepcid at night to dexilant 60 mg daily with benefit on GERD sxs but no effect on chest pain.   Saw psychiatrist Dr ZaMaxie Better tapering off celexa, thought situational anxiety/grieving over loss of function. Placed on lorazepam 0.53m60m/2-1 BID PRN anxiety.      Relevant past medical, surgical, family and social history reviewed and updated as indicated. Interim medical history since our last visit reviewed. Allergies and medications  reviewed and updated. Outpatient Medications Prior to Visit  Medication Sig Dispense Refill  . albuterol (PROVENTIL HFA;VENTOLIN HFA) 108 (90 Base) MCG/ACT inhaler INHALE 2 PUFFS INTO THE LUNGS EVERY 6 HOURS AS NEEDED FOR WHEEZING ORSHORTNESS OF BREATH 18 g 6  . amLODipine (NORVASC) 5 MG tablet Take 1 tablet (5 mg total) by mouth daily.    . aMarland Kitchenpirin 81 MG chewable tablet Chew 81 mg by mouth daily.     . aMarland Kitchenorvastatin (LIPITOR) 20 MG tablet Take 1 tablet (20 mg total) by mouth daily.    . bumetanide (BUMEX) 2 MG tablet Take 1 tablet (2 mg total) by mouth 2 (two) times daily as needed (weight gain/leg swelling).    . citalopram (CELEXA) 20 MG tablet Take 0.5 tablets (10 mg total) by mouth daily.    . clopidogrel (PLAVIX) 75 MG tablet Take 1 tablet (75 mg total) by mouth daily.    . clotrimazole-betamethasone (LOTRISONE) cream Apply 1 application topically 2 (two) times daily. 30 g 0  . DEXILANT 60 MG capsule TAKE 1 CAPSULE BY MOUTH ONCE DAILY 30 capsule 3  . diclofenac sodium (VOLTAREN) 1 % GEL Apply 1 application topically 3 (three) times daily. 1 Tube 1  . Docusate Sodium (DSS) 100 MG CAPS 1 tab 2 times a day while on narcotics.  STOOL SOFTENER 60 each 0  . famotidine (PEPCID)  20 MG tablet Take 1 tablet (20 mg total) by mouth at bedtime. 90 tablet 1  . fluticasone (FLONASE) 50 MCG/ACT nasal spray PLACE 2 SPRAYS INTO BOTH NOSTRILS DAILY 16 g 2  . Insulin Glargine (LANTUS SOLOSTAR) 100 UNIT/ML Solostar Pen Inject 50 Units into the skin 2 (two) times daily. 10 pen 2  . Insulin Pen Needle (ULTICARE MINI PEN NEEDLES) 31G X 6 MM MISC USE AS DIRECTED FOR LANTUS SOLOSTAR PEN 100 each 3  . isosorbide mononitrate (IMDUR) 60 MG 24 hr tablet TAKE 3 TABLETS BY MOUTH ONCE DAILY 90 tablet 2  . levothyroxine (SYNTHROID, LEVOTHROID) 125 MCG tablet TAKE 1 TABLET BY MOUTH ONCE DAILY 30 tablet 3  . lisinopril (PRINIVIL,ZESTRIL) 20 MG tablet Take 1 tablet (20 mg total) by mouth daily. 90 tablet 3  . magnesium oxide  (MAG-OX) 400 MG tablet Take 1 tablet by mouth 2 (two) times daily.    . metFORMIN (GLUCOPHAGE) 1000 MG tablet TAKE 1 TABLET BY MOUTH TWICE (2) DAILY WITH A MEAL 180 tablet 1  . metoCLOPramide (REGLAN) 10 MG tablet TAKE 1 TABLET BY MOUTH 3 TIMES DAILY BEFORE MEALS 90 tablet 5  . metolazone (ZAROXOLYN) 2.5 MG tablet Take 1 tablet (2.5 mg total) by mouth daily as needed (for more than 3lb/day or 5lb/wk weight gain).    . metoprolol succinate (TOPROL-XL) 50 MG 24 hr tablet TAKE ONE TABLET BY MOUTH ONCE DAILY 90 tablet 1  . Multiple Vitamin (MULITIVITAMIN WITH MINERALS) TABS Take 1 tablet by mouth daily.    . nitroGLYCERIN (NITROSTAT) 0.4 MG SL tablet Place 1 tablet (0.4 mg total) under the tongue every 5 (five) minutes as needed for chest pain. 60 tablet 3  . polyethylene glycol (MIRALAX / GLYCOLAX) packet 17grams in 16 oz of water twice a day until bowel movement.  LAXITIVE.  Restart if two days since last bowel movement 14 each 0  . potassium chloride (K-DUR,KLOR-CON) 10 MEQ tablet Take 10 mEq by mouth 2 (two) times daily.    Marland Kitchen spironolactone (ALDACTONE) 25 MG tablet Take 0.5 tablets by mouth daily.    Marland Kitchen ULTICARE MINI PEN NEEDLES 31G X 6 MM MISC     . vitamin E 400 UNIT capsule Take 400 Units by mouth daily.    . citalopram (CELEXA) 20 MG tablet Take 1 tablet (20 mg total) by mouth daily. 90 tablet 3  . diazepam (VALIUM) 2 MG tablet Take 0.5-1 tablets (1-2 mg total) by mouth every 8 (eight) hours as needed for muscle spasms.    Marland Kitchen LORazepam (ATIVAN) 0.5 MG tablet Take 0.5-1 tablets (0.25-0.5 mg total) by mouth 2 (two) times daily as needed for anxiety. 30 tablet 1   No facility-administered medications prior to visit.      Per HPI unless specifically indicated in ROS section below Review of Systems Objective:    BP 120/64 (BP Location: Left Arm, Patient Position: Sitting, Cuff Size: Large)   Pulse 75   Temp 98.2 F (36.8 C) (Oral)   Ht 5' 10"  (1.778 m)   Wt (!) 322 lb (146.1 kg)   SpO2 98%    BMI 46.20 kg/m   Wt Readings from Last 3 Encounters:  05/01/18 (!) 322 lb (146.1 kg)  04/06/18 (!) 329 lb 2 oz (149.3 kg)  01/30/18 (!) 310 lb 4 oz (140.7 kg)    Physical Exam Vitals signs and nursing note reviewed.  Constitutional:      Appearance: Normal appearance. He is not ill-appearing.  Cardiovascular:  Rate and Rhythm: Normal rate and regular rhythm.     Pulses: Normal pulses.     Heart sounds: Normal heart sounds. No murmur.  Pulmonary:     Effort: Pulmonary effort is normal. No respiratory distress.     Breath sounds: Normal breath sounds. No wheezing, rhonchi or rales.  Neurological:     Mental Status: He is alert.   Noted dyspnea and chest pain with walking out of exam room that resolves with rest and nitro. Brought back into room for EKG - unrevealing as per below.     Results for orders placed or performed in visit on 05/01/18  POCT glycosylated hemoglobin (Hb A1C)  Result Value Ref Range   Hemoglobin A1C 8.5 (A) 4.0 - 5.6 %   HbA1c POC (<> result, manual entry)     HbA1c, POC (prediabetic range)     HbA1c, POC (controlled diabetic range)     Lab Results  Component Value Date   CREATININE 1.02 04/06/2018   BUN 18 04/06/2018   NA 134 (L) 04/06/2018   K 3.8 04/06/2018   CL 95 (L) 04/06/2018   CO2 26 04/06/2018    Lab Results  Component Value Date   TSH 2.58 01/30/2018    EKG - RBBB, RAD, 1st degree AV block, poor R wave progression, overall unchanged from prior   Echo 03/2018 - technically difficult, mild LVH, EF 60-65%, G1DD, dilated PA and RV and RA.   CTA chest 03/2018 - no PE  PET perfusion scan 01/2018 - normal perfusion, no significant ischemia or scar, global systolic function normal durings tress Assessment & Plan:   Problem List Items Addressed This Visit    Type 2 diabetes mellitus, uncontrolled, with retinopathy ([place])    Continue lantus 50u BID and metformin 1062m bid.  A1c elevated - will work towards diabetic diet to improve  glycemic control.  Discussed weekly GLP1 RA injectables as he would likely benefit from this - he will check on what type of thyroid cancer his mother had. If unable to find out, will increase insulin and discuss possible endo referral.       Relevant Orders   POCT glycosylated hemoglobin (Hb A1C) (Completed)   Situational anxiety    Appreciate psych care - tapering off celexa, started on lorazepam 0.569m1/2-1 BID PRN in place of valium.       Relevant Medications   LORazepam (ATIVAN) 0.5 MG tablet   citalopram (CELEXA) 20 MG tablet   OSA (obstructive sleep apnea) (Chronic)    Does not tolerate, has refused CPAP mask.       Obesity, Class III, BMI 40-49.9 (morbid obesity) (HCC) (Chronic)    Dry weight seems closer to 310 lbs.       Liver cirrhosis secondary to NASH ([place](Chronic)    Update liver [place] need labs next visit. Concern for cirrhosis by prior CT (nodular liver) 2017      Relevant Orders   [place]domen Complete   Coronary artery disease, non-occlusive (Chronic)    [facility] difficult situation with ongoing chest pain symptoms.  S/p LAD stent, patent on last LHC 03/2017, reassuring PET perfusion scan 01/2018.  Already on high dose imdur and continues SL nitro use PRN.  ?ranexa benefit.       [place]   May need increased bumex with recent weights, awaiting [facility] RN call for directions.       Chest pain - Primary  Difficult situation - anticipate CHF and microvascular ischemia leading to recurrent chest pains however seems to be on maximal medical therapy for this already. He is already on high dose imdur (165m daily).  Again today with exertional chest discomfort that improves with SL nitro. EKG updated today.  ?ranexa benefit - will recommend he touch base with cardiology about this.       Relevant Orders   EKG 12-Lead (Completed)       No orders of the defined types were placed in  this encounter.  Orders Placed This Encounter  Procedures  . [place]domen Complete    Standing Status:   Future    Standing Expiration Date:   07/01/2019    Order Specific Question:   Reason for Exam (SYMPTOM  OR DIAGNOSIS REQUIRED)    Answer:   f/u NASH    Order Specific Question:   Preferred imaging location?    Answer:   [facility]  . POCT glycosylated hemoglobin (Hb A1C)  . EKG 12-Lead    Patient Instructions  You may need higher bumex with recent weight gain.  Check on type of thyroid cancer mother had - want to avoid medullary thyroid cancer. Let me know if you find out.  Continue current medicines.  We will order abdominal ultrasound to check on liver.    Follow up plan: Return in about 6 weeks (around 06/12/2018) for follow up visit.  JRia Bush MD

## 2018-05-01 NOTE — Patient Instructions (Addendum)
You may need higher bumex with recent weight gain.  Check on type of thyroid cancer mother had - want to avoid medullary thyroid cancer. Let me know if you find out.  Continue current medicines.  We will order abdominal ultrasound to check on liver.

## 2018-05-02 DIAGNOSIS — I2510 Atherosclerotic heart disease of native coronary artery without angina pectoris: Secondary | ICD-10-CM | POA: Diagnosis not present

## 2018-05-02 DIAGNOSIS — G4733 Obstructive sleep apnea (adult) (pediatric): Secondary | ICD-10-CM | POA: Diagnosis not present

## 2018-05-02 DIAGNOSIS — I5032 Chronic diastolic (congestive) heart failure: Secondary | ICD-10-CM | POA: Diagnosis not present

## 2018-05-02 DIAGNOSIS — E785 Hyperlipidemia, unspecified: Secondary | ICD-10-CM | POA: Diagnosis not present

## 2018-05-02 DIAGNOSIS — E119 Type 2 diabetes mellitus without complications: Secondary | ICD-10-CM | POA: Diagnosis not present

## 2018-05-02 DIAGNOSIS — I110 Hypertensive heart disease with heart failure: Secondary | ICD-10-CM | POA: Diagnosis not present

## 2018-05-02 NOTE — Telephone Encounter (Signed)
Spoke with pt relaying Dr. Synthia Innocent message. Pt agrees to sending rx for Ozempic.  If not covered or cost effective, pt agrees to try Trulicity.

## 2018-05-02 NOTE — Telephone Encounter (Signed)
Good to hear - would he like [place] to send in ozempic once weekly diabetes shot to price out? Options in the family of medicines would be ozempic or trulicity weekly shots - whichever insurance covers better.

## 2018-05-02 NOTE — Telephone Encounter (Signed)
Spoke with pt asking how he's doing. States he still feels a little weak but is better than yesterday. Pt declines offer of endo referral since he already has so much going on.  Pt wants Dr. Darnell Level to know [place] is scheduled for 05/04/18. Also, his mom did not have thyroid CA. It was enlarged thyroid and she had part of it removed.

## 2018-05-03 DIAGNOSIS — Z9081 Acquired absence of spleen: Secondary | ICD-10-CM | POA: Diagnosis not present

## 2018-05-03 DIAGNOSIS — Z9884 Bariatric surgery status: Secondary | ICD-10-CM | POA: Diagnosis not present

## 2018-05-03 DIAGNOSIS — E119 Type 2 diabetes mellitus without complications: Secondary | ICD-10-CM | POA: Diagnosis not present

## 2018-05-03 DIAGNOSIS — H9193 Unspecified hearing loss, bilateral: Secondary | ICD-10-CM | POA: Diagnosis not present

## 2018-05-03 DIAGNOSIS — Z7982 Long term (current) use of aspirin: Secondary | ICD-10-CM | POA: Diagnosis not present

## 2018-05-03 DIAGNOSIS — E669 Obesity, unspecified: Secondary | ICD-10-CM | POA: Diagnosis not present

## 2018-05-03 DIAGNOSIS — K7581 Nonalcoholic steatohepatitis (NASH): Secondary | ICD-10-CM | POA: Diagnosis not present

## 2018-05-03 DIAGNOSIS — E039 Hypothyroidism, unspecified: Secondary | ICD-10-CM | POA: Diagnosis not present

## 2018-05-03 DIAGNOSIS — Z6841 Body Mass Index (BMI) 40.0 and over, adult: Secondary | ICD-10-CM | POA: Diagnosis not present

## 2018-05-03 DIAGNOSIS — G4733 Obstructive sleep apnea (adult) (pediatric): Secondary | ICD-10-CM | POA: Diagnosis not present

## 2018-05-03 DIAGNOSIS — E785 Hyperlipidemia, unspecified: Secondary | ICD-10-CM | POA: Diagnosis not present

## 2018-05-03 DIAGNOSIS — I5032 Chronic diastolic (congestive) heart failure: Secondary | ICD-10-CM | POA: Diagnosis not present

## 2018-05-03 DIAGNOSIS — M75100 Unspecified rotator cuff tear or rupture of unspecified shoulder, not specified as traumatic: Secondary | ICD-10-CM | POA: Diagnosis not present

## 2018-05-03 DIAGNOSIS — K219 Gastro-esophageal reflux disease without esophagitis: Secondary | ICD-10-CM | POA: Diagnosis not present

## 2018-05-03 DIAGNOSIS — Z9049 Acquired absence of other specified parts of digestive tract: Secondary | ICD-10-CM | POA: Diagnosis not present

## 2018-05-03 DIAGNOSIS — I110 Hypertensive heart disease with heart failure: Secondary | ICD-10-CM | POA: Diagnosis not present

## 2018-05-03 DIAGNOSIS — Z794 Long term (current) use of insulin: Secondary | ICD-10-CM | POA: Diagnosis not present

## 2018-05-03 DIAGNOSIS — Z7902 Long term (current) use of antithrombotics/antiplatelets: Secondary | ICD-10-CM | POA: Diagnosis not present

## 2018-05-03 DIAGNOSIS — I2510 Atherosclerotic heart disease of native coronary artery without angina pectoris: Secondary | ICD-10-CM | POA: Diagnosis not present

## 2018-05-03 MED ORDER — SEMAGLUTIDE(0.25 OR 0.5MG/DOS) 2 MG/1.5ML ~~LOC~~ SOPN: PEN_INJECTOR | SUBCUTANEOUS | 1 refills | Status: DC

## 2018-05-03 NOTE — Telephone Encounter (Signed)
Yes in addition

## 2018-05-03 NOTE — Telephone Encounter (Signed)
ozempic sent in. Start at 0.51m injection weekly for 2 wks then increase to 0.551mweekly then reassess at f/u.

## 2018-05-03 NOTE — Telephone Encounter (Signed)
Spoke with pt and wife, [name] Pulse, relaying instructions per Dr. Darnell Level.  They verbalize understanding but are asking if this is in addition to daily Lantus inj.

## 2018-05-03 NOTE — Addendum Note (Signed)
Addended by: [name] on: 05/03/2018 01:35 PM   Modules accepted: Orders

## 2018-05-04 ENCOUNTER — Other Ambulatory Visit: Payer: Self-pay

## 2018-05-04 ENCOUNTER — Telehealth: Payer: Self-pay

## 2018-05-04 ENCOUNTER — Ambulatory Visit
Admission: RE | Admit: 2018-05-04 | Discharge: 2018-05-04 | Disposition: A | Discharge status: Home / Self Care | Payer: Medicare Other | Source: Ambulatory Visit | Attending: Family Medicine | Admitting: Family Medicine

## 2018-05-04 DIAGNOSIS — I5032 Chronic diastolic (congestive) heart failure: Secondary | ICD-10-CM | POA: Diagnosis not present

## 2018-05-04 DIAGNOSIS — I110 Hypertensive heart disease with heart failure: Secondary | ICD-10-CM | POA: Diagnosis not present

## 2018-05-04 DIAGNOSIS — E119 Type 2 diabetes mellitus without complications: Secondary | ICD-10-CM | POA: Diagnosis not present

## 2018-05-04 DIAGNOSIS — K7581 Nonalcoholic steatohepatitis (NASH): Secondary | ICD-10-CM | POA: Diagnosis not present

## 2018-05-04 DIAGNOSIS — I2510 Atherosclerotic heart disease of native coronary artery without angina pectoris: Secondary | ICD-10-CM | POA: Diagnosis not present

## 2018-05-04 DIAGNOSIS — E785 Hyperlipidemia, unspecified: Secondary | ICD-10-CM | POA: Diagnosis not present

## 2018-05-04 DIAGNOSIS — K7460 Unspecified cirrhosis of liver: Secondary | ICD-10-CM | POA: Insufficient documentation

## 2018-05-04 DIAGNOSIS — G4733 Obstructive sleep apnea (adult) (pediatric): Secondary | ICD-10-CM | POA: Diagnosis not present

## 2018-05-04 NOTE — Telephone Encounter (Signed)
Pt left v/m that he has received a new pen and wants to know if he should use the new pen with the pen he already has. Pt request cb from [facility].

## 2018-05-04 NOTE — Telephone Encounter (Signed)
Spoke with pt's wife, [name] Pulse (on dpr), informing her the Ozempic is in addition to the Lantus for the pt.  She verbalizes understanding and will notify the pt.

## 2018-05-04 NOTE — Telephone Encounter (Signed)
Spoke with pt's wife, [name] Pulse (on dpr).  See TE, 05/01/18.

## 2018-05-07 DIAGNOSIS — G4733 Obstructive sleep apnea (adult) (pediatric): Secondary | ICD-10-CM | POA: Diagnosis not present

## 2018-05-07 DIAGNOSIS — I110 Hypertensive heart disease with heart failure: Secondary | ICD-10-CM | POA: Diagnosis not present

## 2018-05-07 DIAGNOSIS — E785 Hyperlipidemia, unspecified: Secondary | ICD-10-CM | POA: Diagnosis not present

## 2018-05-07 DIAGNOSIS — I2510 Atherosclerotic heart disease of native coronary artery without angina pectoris: Secondary | ICD-10-CM | POA: Diagnosis not present

## 2018-05-07 DIAGNOSIS — E119 Type 2 diabetes mellitus without complications: Secondary | ICD-10-CM | POA: Diagnosis not present

## 2018-05-07 DIAGNOSIS — I5032 Chronic diastolic (congestive) heart failure: Secondary | ICD-10-CM | POA: Diagnosis not present

## 2018-05-08 DIAGNOSIS — I2510 Atherosclerotic heart disease of native coronary artery without angina pectoris: Secondary | ICD-10-CM | POA: Diagnosis not present

## 2018-05-08 DIAGNOSIS — E785 Hyperlipidemia, unspecified: Secondary | ICD-10-CM | POA: Diagnosis not present

## 2018-05-08 DIAGNOSIS — I110 Hypertensive heart disease with heart failure: Secondary | ICD-10-CM | POA: Diagnosis not present

## 2018-05-08 DIAGNOSIS — E119 Type 2 diabetes mellitus without complications: Secondary | ICD-10-CM | POA: Diagnosis not present

## 2018-05-08 DIAGNOSIS — G4733 Obstructive sleep apnea (adult) (pediatric): Secondary | ICD-10-CM | POA: Diagnosis not present

## 2018-05-08 DIAGNOSIS — I5032 Chronic diastolic (congestive) heart failure: Secondary | ICD-10-CM | POA: Diagnosis not present

## 2018-05-09 DIAGNOSIS — I2510 Atherosclerotic heart disease of native coronary artery without angina pectoris: Secondary | ICD-10-CM | POA: Diagnosis not present

## 2018-05-09 DIAGNOSIS — I110 Hypertensive heart disease with heart failure: Secondary | ICD-10-CM | POA: Diagnosis not present

## 2018-05-09 DIAGNOSIS — I5032 Chronic diastolic (congestive) heart failure: Secondary | ICD-10-CM | POA: Diagnosis not present

## 2018-05-09 DIAGNOSIS — E785 Hyperlipidemia, unspecified: Secondary | ICD-10-CM | POA: Diagnosis not present

## 2018-05-09 DIAGNOSIS — E119 Type 2 diabetes mellitus without complications: Secondary | ICD-10-CM | POA: Diagnosis not present

## 2018-05-09 DIAGNOSIS — G4733 Obstructive sleep apnea (adult) (pediatric): Secondary | ICD-10-CM | POA: Diagnosis not present

## 2018-05-14 DIAGNOSIS — E119 Type 2 diabetes mellitus without complications: Secondary | ICD-10-CM | POA: Diagnosis not present

## 2018-05-14 DIAGNOSIS — I2510 Atherosclerotic heart disease of native coronary artery without angina pectoris: Secondary | ICD-10-CM | POA: Diagnosis not present

## 2018-05-14 DIAGNOSIS — G4733 Obstructive sleep apnea (adult) (pediatric): Secondary | ICD-10-CM | POA: Diagnosis not present

## 2018-05-14 DIAGNOSIS — I5032 Chronic diastolic (congestive) heart failure: Secondary | ICD-10-CM | POA: Diagnosis not present

## 2018-05-14 DIAGNOSIS — E785 Hyperlipidemia, unspecified: Secondary | ICD-10-CM | POA: Diagnosis not present

## 2018-05-14 DIAGNOSIS — I110 Hypertensive heart disease with heart failure: Secondary | ICD-10-CM | POA: Diagnosis not present

## 2018-05-16 DIAGNOSIS — I2510 Atherosclerotic heart disease of native coronary artery without angina pectoris: Secondary | ICD-10-CM | POA: Diagnosis not present

## 2018-05-16 DIAGNOSIS — G4733 Obstructive sleep apnea (adult) (pediatric): Secondary | ICD-10-CM | POA: Diagnosis not present

## 2018-05-16 DIAGNOSIS — E785 Hyperlipidemia, unspecified: Secondary | ICD-10-CM | POA: Diagnosis not present

## 2018-05-16 DIAGNOSIS — I110 Hypertensive heart disease with heart failure: Secondary | ICD-10-CM | POA: Diagnosis not present

## 2018-05-16 DIAGNOSIS — I5032 Chronic diastolic (congestive) heart failure: Secondary | ICD-10-CM | POA: Diagnosis not present

## 2018-05-16 DIAGNOSIS — E119 Type 2 diabetes mellitus without complications: Secondary | ICD-10-CM | POA: Diagnosis not present

## 2018-05-17 ENCOUNTER — Encounter (HOSPITAL_COMMUNITY): Payer: Self-pay

## 2018-05-17 ENCOUNTER — Emergency Department (HOSPITAL_COMMUNITY): Payer: Medicare Other

## 2018-05-17 ENCOUNTER — Emergency Department (HOSPITAL_COMMUNITY)
Admission: EM | Admit: 2018-05-17 | Discharge: 2018-05-17 | Disposition: A | Discharge status: Home / Self Care | Payer: Medicare Other | Attending: Emergency Medicine | Admitting: Emergency Medicine

## 2018-05-17 ENCOUNTER — Other Ambulatory Visit: Payer: Self-pay

## 2018-05-17 DIAGNOSIS — I10 Essential (primary) hypertension: Secondary | ICD-10-CM | POA: Diagnosis not present

## 2018-05-17 DIAGNOSIS — Z96653 Presence of artificial knee joint, bilateral: Secondary | ICD-10-CM | POA: Diagnosis not present

## 2018-05-17 DIAGNOSIS — R079 Chest pain, unspecified: Secondary | ICD-10-CM | POA: Diagnosis not present

## 2018-05-17 DIAGNOSIS — Z7982 Long term (current) use of aspirin: Secondary | ICD-10-CM | POA: Diagnosis not present

## 2018-05-17 DIAGNOSIS — Z79899 Other long term (current) drug therapy: Secondary | ICD-10-CM | POA: Diagnosis not present

## 2018-05-17 DIAGNOSIS — E039 Hypothyroidism, unspecified: Secondary | ICD-10-CM | POA: Insufficient documentation

## 2018-05-17 DIAGNOSIS — Z9049 Acquired absence of other specified parts of digestive tract: Secondary | ICD-10-CM | POA: Insufficient documentation

## 2018-05-17 DIAGNOSIS — Z9884 Bariatric surgery status: Secondary | ICD-10-CM | POA: Insufficient documentation

## 2018-05-17 DIAGNOSIS — I2510 Atherosclerotic heart disease of native coronary artery without angina pectoris: Secondary | ICD-10-CM | POA: Diagnosis not present

## 2018-05-17 DIAGNOSIS — Z794 Long term (current) use of insulin: Secondary | ICD-10-CM | POA: Diagnosis not present

## 2018-05-17 DIAGNOSIS — R001 Bradycardia, unspecified: Secondary | ICD-10-CM | POA: Diagnosis not present

## 2018-05-17 DIAGNOSIS — E119 Type 2 diabetes mellitus without complications: Secondary | ICD-10-CM | POA: Insufficient documentation

## 2018-05-17 DIAGNOSIS — J45909 Unspecified asthma, uncomplicated: Secondary | ICD-10-CM | POA: Insufficient documentation

## 2018-05-17 DIAGNOSIS — E785 Hyperlipidemia, unspecified: Secondary | ICD-10-CM | POA: Diagnosis not present

## 2018-05-17 DIAGNOSIS — I5032 Chronic diastolic (congestive) heart failure: Secondary | ICD-10-CM | POA: Insufficient documentation

## 2018-05-17 DIAGNOSIS — E1165 Type 2 diabetes mellitus with hyperglycemia: Secondary | ICD-10-CM | POA: Diagnosis not present

## 2018-05-17 DIAGNOSIS — Z7902 Long term (current) use of antithrombotics/antiplatelets: Secondary | ICD-10-CM | POA: Diagnosis not present

## 2018-05-17 DIAGNOSIS — I110 Hypertensive heart disease with heart failure: Secondary | ICD-10-CM | POA: Insufficient documentation

## 2018-05-17 DIAGNOSIS — G4733 Obstructive sleep apnea (adult) (pediatric): Secondary | ICD-10-CM | POA: Diagnosis not present

## 2018-05-17 DIAGNOSIS — R0602 Shortness of breath: Secondary | ICD-10-CM | POA: Diagnosis not present

## 2018-05-17 LAB — CBC
HCT: 40.7 % (ref 39.0–52.0)
Hemoglobin: 12.8 g/dL — ABNORMAL LOW (ref 13.0–17.0)
MCH: 25.4 pg — AB (ref 26.0–34.0)
MCHC: 31.4 g/dL (ref 30.0–36.0)
MCV: 80.9 fL (ref 80.0–100.0)
Platelets: 160 10*3/uL (ref 150–400)
RBC: 5.03 MIL/uL (ref 4.22–5.81)
RDW: 14.1 % (ref 11.5–15.5)
WBC: 5.4 10*3/uL (ref 4.0–10.5)
nRBC: 0 % (ref 0.0–0.2)

## 2018-05-17 LAB — BASIC METABOLIC PANEL
Anion gap: 16 — ABNORMAL HIGH (ref 5–15)
BUN: 15 mg/dL (ref 8–23)
CO2: 24 mmol/L (ref 22–32)
Calcium: 9 mg/dL (ref 8.9–10.3)
Chloride: 94 mmol/L — ABNORMAL LOW (ref 98–111)
Creatinine, Ser: 1.11 mg/dL (ref 0.61–1.24)
GFR calc Af Amer: >60 mL/min (ref >60)
Glucose, Bld: 239 mg/dL — ABNORMAL HIGH (ref 70–99)
Potassium: 3.7 mmol/L (ref 3.5–5.1)
Sodium: 134 mmol/L — ABNORMAL LOW (ref 135–145)

## 2018-05-17 LAB — TROPONIN I

## 2018-05-17 MED ORDER — SODIUM CHLORIDE 0.9% FLUSH: 3.0000 mL | Freq: Once | INTRAVENOUS | Status: DC

## 2018-05-17 NOTE — ED Triage Notes (Signed)
Pt reports chest pain 3x days, with [facility] upon exersion, has taken nitroglycerine and Asprin today

## 2018-05-17 NOTE — Discharge Instructions (Signed)
The testing today does not show any serious problem.  Your primary care doctor plans on discussing your case with your cardiologist to see if there is an additional medicine you can take to prevent your chest pain.  Make sure that you are taking all of your medicines as directed.  Avoid close contact with people, wash your hands frequently, and call your primary care doctor if you were worried about a febrile illness.
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
## 2018-05-18 DIAGNOSIS — I5032 Chronic diastolic (congestive) heart failure: Secondary | ICD-10-CM | POA: Diagnosis not present

## 2018-05-23 DIAGNOSIS — E119 Type 2 diabetes mellitus without complications: Secondary | ICD-10-CM | POA: Diagnosis not present

## 2018-05-23 DIAGNOSIS — G4733 Obstructive sleep apnea (adult) (pediatric): Secondary | ICD-10-CM | POA: Diagnosis not present

## 2018-05-23 DIAGNOSIS — I110 Hypertensive heart disease with heart failure: Secondary | ICD-10-CM | POA: Diagnosis not present

## 2018-05-23 DIAGNOSIS — E785 Hyperlipidemia, unspecified: Secondary | ICD-10-CM | POA: Diagnosis not present

## 2018-05-23 DIAGNOSIS — I5032 Chronic diastolic (congestive) heart failure: Secondary | ICD-10-CM | POA: Diagnosis not present

## 2018-05-23 DIAGNOSIS — I2510 Atherosclerotic heart disease of native coronary artery without angina pectoris: Secondary | ICD-10-CM | POA: Diagnosis not present

## 2018-05-24 ENCOUNTER — Other Ambulatory Visit: Payer: Self-pay | Admitting: Family Medicine

## 2018-05-25 DIAGNOSIS — I110 Hypertensive heart disease with heart failure: Secondary | ICD-10-CM | POA: Diagnosis not present

## 2018-05-25 DIAGNOSIS — E119 Type 2 diabetes mellitus without complications: Secondary | ICD-10-CM | POA: Diagnosis not present

## 2018-05-25 DIAGNOSIS — I2510 Atherosclerotic heart disease of native coronary artery without angina pectoris: Secondary | ICD-10-CM | POA: Diagnosis not present

## 2018-05-25 DIAGNOSIS — I5032 Chronic diastolic (congestive) heart failure: Secondary | ICD-10-CM | POA: Diagnosis not present

## 2018-05-25 DIAGNOSIS — G4733 Obstructive sleep apnea (adult) (pediatric): Secondary | ICD-10-CM | POA: Diagnosis not present

## 2018-05-25 DIAGNOSIS — E785 Hyperlipidemia, unspecified: Secondary | ICD-10-CM | POA: Diagnosis not present

## 2018-05-29 DIAGNOSIS — G4733 Obstructive sleep apnea (adult) (pediatric): Secondary | ICD-10-CM | POA: Diagnosis not present

## 2018-05-29 DIAGNOSIS — I2510 Atherosclerotic heart disease of native coronary artery without angina pectoris: Secondary | ICD-10-CM | POA: Diagnosis not present

## 2018-05-29 DIAGNOSIS — I110 Hypertensive heart disease with heart failure: Secondary | ICD-10-CM | POA: Diagnosis not present

## 2018-05-29 DIAGNOSIS — E785 Hyperlipidemia, unspecified: Secondary | ICD-10-CM | POA: Diagnosis not present

## 2018-05-29 DIAGNOSIS — I5032 Chronic diastolic (congestive) heart failure: Secondary | ICD-10-CM | POA: Diagnosis not present

## 2018-05-29 DIAGNOSIS — E119 Type 2 diabetes mellitus without complications: Secondary | ICD-10-CM | POA: Diagnosis not present

## 2018-05-30 DIAGNOSIS — E119 Type 2 diabetes mellitus without complications: Secondary | ICD-10-CM | POA: Diagnosis not present

## 2018-05-30 DIAGNOSIS — I110 Hypertensive heart disease with heart failure: Secondary | ICD-10-CM | POA: Diagnosis not present

## 2018-05-30 DIAGNOSIS — E785 Hyperlipidemia, unspecified: Secondary | ICD-10-CM | POA: Diagnosis not present

## 2018-05-30 DIAGNOSIS — I2510 Atherosclerotic heart disease of native coronary artery without angina pectoris: Secondary | ICD-10-CM | POA: Diagnosis not present

## 2018-05-30 DIAGNOSIS — I5032 Chronic diastolic (congestive) heart failure: Secondary | ICD-10-CM | POA: Diagnosis not present

## 2018-05-30 DIAGNOSIS — G4733 Obstructive sleep apnea (adult) (pediatric): Secondary | ICD-10-CM | POA: Diagnosis not present

## 2018-06-02 DIAGNOSIS — E039 Hypothyroidism, unspecified: Secondary | ICD-10-CM | POA: Diagnosis not present

## 2018-06-02 DIAGNOSIS — E669 Obesity, unspecified: Secondary | ICD-10-CM | POA: Diagnosis not present

## 2018-06-02 DIAGNOSIS — Z9049 Acquired absence of other specified parts of digestive tract: Secondary | ICD-10-CM | POA: Diagnosis not present

## 2018-06-02 DIAGNOSIS — H9193 Unspecified hearing loss, bilateral: Secondary | ICD-10-CM | POA: Diagnosis not present

## 2018-06-02 DIAGNOSIS — K219 Gastro-esophageal reflux disease without esophagitis: Secondary | ICD-10-CM | POA: Diagnosis not present

## 2018-06-02 DIAGNOSIS — I2510 Atherosclerotic heart disease of native coronary artery without angina pectoris: Secondary | ICD-10-CM | POA: Diagnosis not present

## 2018-06-02 DIAGNOSIS — Z7902 Long term (current) use of antithrombotics/antiplatelets: Secondary | ICD-10-CM | POA: Diagnosis not present

## 2018-06-02 DIAGNOSIS — Z794 Long term (current) use of insulin: Secondary | ICD-10-CM | POA: Diagnosis not present

## 2018-06-02 DIAGNOSIS — Z6841 Body Mass Index (BMI) 40.0 and over, adult: Secondary | ICD-10-CM | POA: Diagnosis not present

## 2018-06-02 DIAGNOSIS — E785 Hyperlipidemia, unspecified: Secondary | ICD-10-CM | POA: Diagnosis not present

## 2018-06-02 DIAGNOSIS — K7581 Nonalcoholic steatohepatitis (NASH): Secondary | ICD-10-CM | POA: Diagnosis not present

## 2018-06-02 DIAGNOSIS — E119 Type 2 diabetes mellitus without complications: Secondary | ICD-10-CM | POA: Diagnosis not present

## 2018-06-02 DIAGNOSIS — Z7982 Long term (current) use of aspirin: Secondary | ICD-10-CM | POA: Diagnosis not present

## 2018-06-02 DIAGNOSIS — I110 Hypertensive heart disease with heart failure: Secondary | ICD-10-CM | POA: Diagnosis not present

## 2018-06-02 DIAGNOSIS — Z9884 Bariatric surgery status: Secondary | ICD-10-CM | POA: Diagnosis not present

## 2018-06-02 DIAGNOSIS — I5032 Chronic diastolic (congestive) heart failure: Secondary | ICD-10-CM | POA: Diagnosis not present

## 2018-06-02 DIAGNOSIS — M75100 Unspecified rotator cuff tear or rupture of unspecified shoulder, not specified as traumatic: Secondary | ICD-10-CM | POA: Diagnosis not present

## 2018-06-02 DIAGNOSIS — G4733 Obstructive sleep apnea (adult) (pediatric): Secondary | ICD-10-CM | POA: Diagnosis not present

## 2018-06-02 DIAGNOSIS — Z9081 Acquired absence of spleen: Secondary | ICD-10-CM | POA: Diagnosis not present

## 2018-06-06 ENCOUNTER — Other Ambulatory Visit: Payer: Self-pay | Admitting: Family Medicine

## 2018-06-06 DIAGNOSIS — E119 Type 2 diabetes mellitus without complications: Secondary | ICD-10-CM | POA: Diagnosis not present

## 2018-06-06 DIAGNOSIS — I2510 Atherosclerotic heart disease of native coronary artery without angina pectoris: Secondary | ICD-10-CM | POA: Diagnosis not present

## 2018-06-06 DIAGNOSIS — I110 Hypertensive heart disease with heart failure: Secondary | ICD-10-CM | POA: Diagnosis not present

## 2018-06-06 DIAGNOSIS — G4733 Obstructive sleep apnea (adult) (pediatric): Secondary | ICD-10-CM | POA: Diagnosis not present

## 2018-06-06 DIAGNOSIS — E669 Obesity, unspecified: Secondary | ICD-10-CM | POA: Diagnosis not present

## 2018-06-06 DIAGNOSIS — I5032 Chronic diastolic (congestive) heart failure: Secondary | ICD-10-CM | POA: Diagnosis not present

## 2018-06-08 DIAGNOSIS — F418 Other specified anxiety disorders: Secondary | ICD-10-CM | POA: Diagnosis not present

## 2018-06-08 DIAGNOSIS — Z634 Disappearance and death of family member: Secondary | ICD-10-CM | POA: Diagnosis not present

## 2018-06-12 NOTE — Progress Notes (Signed)
Chad [name] - 63 y.o. male  MRN [id number]  Date of Birth: January 27, 1956  PCP: [name], MD  This service was provided via telemedicine. Phone Visit performed on 06/13/2018    Rationale for phone visit along with limitations reviewed. Patient consented to telephone encounter. No video capabilities available.   Location of patient: at home Location of provider: office, [place] @ [facility] Name of referring provider: N/A   Names of persons and role in encounter: Provider: [name], MD  Patient: Chad [name]  Other: wife [name] Pulse also on call   Time on call: 9:51am - 10:09am   Subjective: CC: 6 mo f/u visit HPI:  Telehealth visit from Dr Rocco Pauls psych reviewed - off celexa, no need for this. He has done well with PRN lorazepam 0.69m for anxiety - used about 4 times in the past 6 wks. Psych recommended continue PRN lorazepam for anxiety, no need for anxiolytic antidepressant at this time. I agreed to continue prescription lorazepam. Reviewed with patient - he actually continues taking celexa 17mdaily (1/2 tab of 20100mose).   Feeling weak and low energy. Took 2 bumex yesterday with 2 lb weight loss.  Ongoing dyspnea and intermittent chest pain - sees cardiology.   DM - last month we started onglyza takes on Friday and notes improvement in sugar control and fluid control. cbg's have been mid 100s. Continues metformin 1000m25md and lantus 50 u bid.  Lab Results  Component Value Date   HGBA1C 8.5 (A) 05/01/2018      Objective/Observations:   No physical exam or vital signs collected unless specifically identified below.   BP (!) 143/77 (BP Location: Left Arm)   Pulse 80   Ht 5' 10"  (1.778 m)   Wt (!) 320 lb (145.2 kg)   BMI 45.92 kg/m   Wt Readings from Last 3 Encounters:  06/13/18 (!) 320 lb (145.2 kg)  05/17/18 (!) 341 lb (154.7 kg)  05/01/18 (!) 322 lb (146.1 kg)    Respiratory status: speaks in complete sentences  Assessment/Plan:  Type  2 diabetes mellitus, uncontrolled, with retinopathy (HCC)Renovoproving readings since starting onglyza - continue 0.5mg 27mkly dose. Reassess at 2 mo f/u. I asked him to bring in sugar log to review.   Chest pain Ongoing. Known CHF with microvascular ischemia already on maximal medical therapy.  I asked him to try ativan next time he has chest discomfort to see if any benefit - he did take one while on call with some improvement noted.  Consider trial ranexa.   Chronic diastolic CHF (congestive heart failure) (HCC) Comern­olowed with [facility] program. Appreciate their care.   Situational anxiety Managing with celexa (1/2 tab) and ativan PRN.  I agreed to continue lorazepam prescription - he found diazepam too sedating.    I discussed the assessment and treatment plan with the patient. The patient was provided an opportunity to ask questions and all were answered. The patient agreed with the plan and demonstrated an understanding of the instructions.  Lab Orders  No laboratory test(s) ordered today    Meds ordered this encounter  Medications  . LORazepam (ATIVAN) 0.5 MG tablet    Sig: Take 0.5-1 tablets (0.25-0.5 mg total) by mouth 2 (two) times daily as needed for anxiety.    Dispense:  30 tablet    Refill:  1  . Semaglutide,0.25 or 0.5MG/DOS, (OZEMPIC, 0.25 OR 0.5 MG/DOSE,) 2 MG/1.5ML SOPN    Sig: Inject 0.5 mg into the skin once  a week for 30 days.    Dispense:  1 pen    Refill:  11    The patient was advised to call back or seek an in-person evaluation if the symptoms worsen or if the condition fails to improve as anticipated.  [name], MD

## 2018-06-13 ENCOUNTER — Ambulatory Visit (INDEPENDENT_AMBULATORY_CARE_PROVIDER_SITE_OTHER): Payer: Medicare Other | Admitting: Family Medicine

## 2018-06-13 ENCOUNTER — Encounter: Payer: Self-pay | Admitting: Family Medicine

## 2018-06-13 ENCOUNTER — Telehealth: Payer: Self-pay | Admitting: Family Medicine

## 2018-06-13 VITALS — BP 143/77 | HR 80 | Ht 70.0 in | Wt 320.0 lb

## 2018-06-13 DIAGNOSIS — E1165 Type 2 diabetes mellitus with hyperglycemia: Secondary | ICD-10-CM

## 2018-06-13 DIAGNOSIS — F418 Other specified anxiety disorders: Secondary | ICD-10-CM | POA: Diagnosis not present

## 2018-06-13 DIAGNOSIS — E11319 Type 2 diabetes mellitus with unspecified diabetic retinopathy without macular edema: Secondary | ICD-10-CM

## 2018-06-13 DIAGNOSIS — IMO0002 Reserved for concepts with insufficient information to code with codable children: Secondary | ICD-10-CM

## 2018-06-13 DIAGNOSIS — R079 Chest pain, unspecified: Secondary | ICD-10-CM | POA: Diagnosis not present

## 2018-06-13 DIAGNOSIS — I5032 Chronic diastolic (congestive) heart failure: Secondary | ICD-10-CM

## 2018-06-13 MED ORDER — LORAZEPAM 0.5 MG PO TABS: 0.2500 mg | ORAL_TABLET | Freq: Two times a day (BID) | ORAL | 1 refills | Status: DC | PRN

## 2018-06-13 MED ORDER — SEMAGLUTIDE(0.25 OR 0.5MG/DOS) 2 MG/1.5ML ~~LOC~~ SOPN: 0.5000 mg | PEN_INJECTOR | SUBCUTANEOUS | 11 refills | Status: AC

## 2018-06-13 NOTE — Assessment & Plan Note (Signed)
Improving readings since starting onglyza - continue 0.69m weekly dose. Reassess at 2 mo f/u. I asked him to bring in sugar log to review.

## 2018-06-13 NOTE — Assessment & Plan Note (Signed)
Ongoing. Known CHF with microvascular ischemia already on maximal medical therapy.  I asked him to try ativan next time he has chest discomfort to see if any benefit - he did take one while on call with some improvement noted.  Consider trial ranexa.

## 2018-06-13 NOTE — Telephone Encounter (Signed)
Pt scheduled for 08/14/18 @ 8am

## 2018-06-13 NOTE — Assessment & Plan Note (Signed)
Followed with [facility] program. Appreciate their care.

## 2018-06-13 NOTE — Telephone Encounter (Signed)
plz call and schedule 2 month f/u OV - 30 min mobility evaluation

## 2018-06-13 NOTE — Assessment & Plan Note (Addendum)
Managing with celexa (1/2 tab) and ativan PRN.  I agreed to continue lorazepam prescription - he found diazepam too sedating.

## 2018-06-15 DIAGNOSIS — F419 Anxiety disorder, unspecified: Secondary | ICD-10-CM | POA: Diagnosis not present

## 2018-06-15 DIAGNOSIS — Z7984 Long term (current) use of oral hypoglycemic drugs: Secondary | ICD-10-CM | POA: Diagnosis not present

## 2018-06-15 DIAGNOSIS — Z7902 Long term (current) use of antithrombotics/antiplatelets: Secondary | ICD-10-CM | POA: Diagnosis not present

## 2018-06-15 DIAGNOSIS — I110 Hypertensive heart disease with heart failure: Secondary | ICD-10-CM | POA: Diagnosis not present

## 2018-06-15 DIAGNOSIS — K219 Gastro-esophageal reflux disease without esophagitis: Secondary | ICD-10-CM | POA: Diagnosis not present

## 2018-06-15 DIAGNOSIS — Z96659 Presence of unspecified artificial knee joint: Secondary | ICD-10-CM | POA: Diagnosis not present

## 2018-06-15 DIAGNOSIS — R079 Chest pain, unspecified: Secondary | ICD-10-CM | POA: Diagnosis not present

## 2018-06-15 DIAGNOSIS — E669 Obesity, unspecified: Secondary | ICD-10-CM | POA: Diagnosis not present

## 2018-06-15 DIAGNOSIS — E785 Hyperlipidemia, unspecified: Secondary | ICD-10-CM | POA: Diagnosis not present

## 2018-06-15 DIAGNOSIS — H9193 Unspecified hearing loss, bilateral: Secondary | ICD-10-CM | POA: Diagnosis not present

## 2018-06-15 DIAGNOSIS — G4733 Obstructive sleep apnea (adult) (pediatric): Secondary | ICD-10-CM | POA: Diagnosis not present

## 2018-06-15 DIAGNOSIS — Z794 Long term (current) use of insulin: Secondary | ICD-10-CM | POA: Diagnosis not present

## 2018-06-15 DIAGNOSIS — I5032 Chronic diastolic (congestive) heart failure: Secondary | ICD-10-CM | POA: Diagnosis not present

## 2018-06-15 DIAGNOSIS — I2510 Atherosclerotic heart disease of native coronary artery without angina pectoris: Secondary | ICD-10-CM | POA: Diagnosis not present

## 2018-06-15 DIAGNOSIS — R0789 Other chest pain: Secondary | ICD-10-CM | POA: Diagnosis not present

## 2018-06-15 DIAGNOSIS — E039 Hypothyroidism, unspecified: Secondary | ICD-10-CM | POA: Diagnosis not present

## 2018-06-15 DIAGNOSIS — E119 Type 2 diabetes mellitus without complications: Secondary | ICD-10-CM | POA: Diagnosis not present

## 2018-06-15 DIAGNOSIS — Z9884 Bariatric surgery status: Secondary | ICD-10-CM | POA: Diagnosis not present

## 2018-06-15 DIAGNOSIS — R0602 Shortness of breath: Secondary | ICD-10-CM | POA: Diagnosis not present

## 2018-06-19 DIAGNOSIS — I2510 Atherosclerotic heart disease of native coronary artery without angina pectoris: Secondary | ICD-10-CM | POA: Diagnosis not present

## 2018-06-19 DIAGNOSIS — I5032 Chronic diastolic (congestive) heart failure: Secondary | ICD-10-CM | POA: Diagnosis not present

## 2018-06-19 DIAGNOSIS — I110 Hypertensive heart disease with heart failure: Secondary | ICD-10-CM | POA: Diagnosis not present

## 2018-06-19 DIAGNOSIS — E119 Type 2 diabetes mellitus without complications: Secondary | ICD-10-CM | POA: Diagnosis not present

## 2018-06-19 DIAGNOSIS — E669 Obesity, unspecified: Secondary | ICD-10-CM | POA: Diagnosis not present

## 2018-06-19 DIAGNOSIS — G4733 Obstructive sleep apnea (adult) (pediatric): Secondary | ICD-10-CM | POA: Diagnosis not present

## 2018-06-29 DIAGNOSIS — I2510 Atherosclerotic heart disease of native coronary artery without angina pectoris: Secondary | ICD-10-CM | POA: Diagnosis not present

## 2018-06-29 DIAGNOSIS — G4733 Obstructive sleep apnea (adult) (pediatric): Secondary | ICD-10-CM | POA: Diagnosis not present

## 2018-06-29 DIAGNOSIS — I110 Hypertensive heart disease with heart failure: Secondary | ICD-10-CM | POA: Diagnosis not present

## 2018-06-29 DIAGNOSIS — E669 Obesity, unspecified: Secondary | ICD-10-CM | POA: Diagnosis not present

## 2018-06-29 DIAGNOSIS — E119 Type 2 diabetes mellitus without complications: Secondary | ICD-10-CM | POA: Diagnosis not present

## 2018-06-29 DIAGNOSIS — I5032 Chronic diastolic (congestive) heart failure: Secondary | ICD-10-CM | POA: Diagnosis not present

## 2018-07-02 DIAGNOSIS — E785 Hyperlipidemia, unspecified: Secondary | ICD-10-CM | POA: Diagnosis not present

## 2018-07-02 DIAGNOSIS — G4733 Obstructive sleep apnea (adult) (pediatric): Secondary | ICD-10-CM | POA: Diagnosis not present

## 2018-07-02 DIAGNOSIS — E039 Hypothyroidism, unspecified: Secondary | ICD-10-CM | POA: Diagnosis not present

## 2018-07-02 DIAGNOSIS — Z7982 Long term (current) use of aspirin: Secondary | ICD-10-CM | POA: Diagnosis not present

## 2018-07-02 DIAGNOSIS — Z794 Long term (current) use of insulin: Secondary | ICD-10-CM | POA: Diagnosis not present

## 2018-07-02 DIAGNOSIS — Z7902 Long term (current) use of antithrombotics/antiplatelets: Secondary | ICD-10-CM | POA: Diagnosis not present

## 2018-07-02 DIAGNOSIS — Z9049 Acquired absence of other specified parts of digestive tract: Secondary | ICD-10-CM | POA: Diagnosis not present

## 2018-07-02 DIAGNOSIS — H9193 Unspecified hearing loss, bilateral: Secondary | ICD-10-CM | POA: Diagnosis not present

## 2018-07-02 DIAGNOSIS — K7581 Nonalcoholic steatohepatitis (NASH): Secondary | ICD-10-CM | POA: Diagnosis not present

## 2018-07-02 DIAGNOSIS — I5032 Chronic diastolic (congestive) heart failure: Secondary | ICD-10-CM | POA: Diagnosis not present

## 2018-07-02 DIAGNOSIS — E669 Obesity, unspecified: Secondary | ICD-10-CM | POA: Diagnosis not present

## 2018-07-02 DIAGNOSIS — I110 Hypertensive heart disease with heart failure: Secondary | ICD-10-CM | POA: Diagnosis not present

## 2018-07-02 DIAGNOSIS — I2510 Atherosclerotic heart disease of native coronary artery without angina pectoris: Secondary | ICD-10-CM | POA: Diagnosis not present

## 2018-07-02 DIAGNOSIS — Z6841 Body Mass Index (BMI) 40.0 and over, adult: Secondary | ICD-10-CM | POA: Diagnosis not present

## 2018-07-02 DIAGNOSIS — E119 Type 2 diabetes mellitus without complications: Secondary | ICD-10-CM | POA: Diagnosis not present

## 2018-07-02 DIAGNOSIS — K219 Gastro-esophageal reflux disease without esophagitis: Secondary | ICD-10-CM | POA: Diagnosis not present

## 2018-07-02 DIAGNOSIS — M75100 Unspecified rotator cuff tear or rupture of unspecified shoulder, not specified as traumatic: Secondary | ICD-10-CM | POA: Diagnosis not present

## 2018-07-02 DIAGNOSIS — Z9884 Bariatric surgery status: Secondary | ICD-10-CM | POA: Diagnosis not present

## 2018-07-02 DIAGNOSIS — Z9081 Acquired absence of spleen: Secondary | ICD-10-CM | POA: Diagnosis not present

## 2018-07-03 DIAGNOSIS — I5032 Chronic diastolic (congestive) heart failure: Secondary | ICD-10-CM | POA: Diagnosis not present

## 2018-07-03 DIAGNOSIS — E669 Obesity, unspecified: Secondary | ICD-10-CM | POA: Diagnosis not present

## 2018-07-03 DIAGNOSIS — E119 Type 2 diabetes mellitus without complications: Secondary | ICD-10-CM | POA: Diagnosis not present

## 2018-07-03 DIAGNOSIS — I110 Hypertensive heart disease with heart failure: Secondary | ICD-10-CM | POA: Diagnosis not present

## 2018-07-03 DIAGNOSIS — G4733 Obstructive sleep apnea (adult) (pediatric): Secondary | ICD-10-CM | POA: Diagnosis not present

## 2018-07-03 DIAGNOSIS — I2510 Atherosclerotic heart disease of native coronary artery without angina pectoris: Secondary | ICD-10-CM | POA: Diagnosis not present

## 2018-07-04 ENCOUNTER — Telehealth: Payer: Self-pay | Admitting: Family Medicine

## 2018-07-04 ENCOUNTER — Other Ambulatory Visit: Payer: Self-pay | Admitting: Family Medicine

## 2018-07-04 NOTE — Telephone Encounter (Signed)
Chad [name] @[place]  pharmacy called [facility] [phone] Famotidine  They are out of this and cannot order it.  Can you switch to something else  Not zantac

## 2018-07-05 NOTE — Telephone Encounter (Signed)
Don't recommend tagamet (cimetidine) given possible metformin interaction. Ideally would want to continue pepcid - can pt get from another pharmacy?  Otherwise is there a different H2 blocker that the pharmacist recommends?

## 2018-07-06 NOTE — Telephone Encounter (Signed)
Patient should already be on dexilant 75m BID. Cannot take another PPI.

## 2018-07-06 NOTE — Telephone Encounter (Signed)
Please check with pharmacist to see if there's another H2 blocker that they would recommend.

## 2018-07-06 NOTE — Telephone Encounter (Addendum)
Spoke with pt explaining the dilemma. Asked if there was another pharmacy he would be willing to try.  Pt declines to use another pharmacy. Fyi to Dr. Darnell Level.

## 2018-07-06 NOTE — Telephone Encounter (Signed)
Spoke with [facility] relaying Dr. Synthia Innocent message. Says they have omeprazole and pantoprazole available.

## 2018-07-09 ENCOUNTER — Telehealth: Payer: Self-pay | Admitting: Family Medicine

## 2018-07-09 DIAGNOSIS — I110 Hypertensive heart disease with heart failure: Secondary | ICD-10-CM | POA: Diagnosis not present

## 2018-07-09 DIAGNOSIS — E669 Obesity, unspecified: Secondary | ICD-10-CM | POA: Diagnosis not present

## 2018-07-09 DIAGNOSIS — I2510 Atherosclerotic heart disease of native coronary artery without angina pectoris: Secondary | ICD-10-CM | POA: Diagnosis not present

## 2018-07-09 DIAGNOSIS — I5032 Chronic diastolic (congestive) heart failure: Secondary | ICD-10-CM | POA: Diagnosis not present

## 2018-07-09 DIAGNOSIS — G4733 Obstructive sleep apnea (adult) (pediatric): Secondary | ICD-10-CM | POA: Diagnosis not present

## 2018-07-09 DIAGNOSIS — E119 Type 2 diabetes mellitus without complications: Secondary | ICD-10-CM | POA: Diagnosis not present

## 2018-07-09 MED ORDER — SUCRALFATE 1 G PO TABS: 1.0000 g | ORAL_TABLET | Freq: Two times a day (BID) | ORAL | 0 refills | Status: DC

## 2018-07-09 NOTE — Telephone Encounter (Addendum)
Spoke with Gerald Stabs of [facility], asking about another H2 blocker. Says they have the tagemet but understands that is not an option for the pt.  They were told today it won't be until about the 2nd wk of June before they get anymore famotidine.   Spoke with pt relaying info from pharmacy. Verbalizes understanding.  I asked about pt taking Dexilant BID.  Says he's only taking once daily.  Says he will need another rx if he's supposed to take BID. Fyi to Dr. Darnell Level.

## 2018-07-09 NOTE — Telephone Encounter (Signed)
Continue dexilant 31m daily. Let's add carafate as needed with meals for the next month while we await pepcid availability.

## 2018-07-09 NOTE — Addendum Note (Signed)
Addended by: [name] on: 07/09/2018 05:03 PM   Modules accepted: Orders

## 2018-07-09 NOTE — Telephone Encounter (Signed)
Pt went to pharmacy and said indigestion medicine has not been sent to pharmacy and they want to know when it will be sent. They want you to call them once it is done. Pt's wife couldn't tell me the name of prescription.   CB [phone]

## 2018-07-10 NOTE — Telephone Encounter (Signed)
See TE, 07/04/18.

## 2018-07-10 NOTE — Telephone Encounter (Signed)
Spoke with pt relaying Dr. Synthia Innocent instructions.  Pt verbalizes understanding.

## 2018-07-19 ENCOUNTER — Other Ambulatory Visit: Payer: Self-pay | Admitting: Family Medicine

## 2018-07-20 DIAGNOSIS — G4733 Obstructive sleep apnea (adult) (pediatric): Secondary | ICD-10-CM | POA: Diagnosis not present

## 2018-07-20 DIAGNOSIS — I2510 Atherosclerotic heart disease of native coronary artery without angina pectoris: Secondary | ICD-10-CM | POA: Diagnosis not present

## 2018-07-20 DIAGNOSIS — E119 Type 2 diabetes mellitus without complications: Secondary | ICD-10-CM | POA: Diagnosis not present

## 2018-07-20 DIAGNOSIS — I5032 Chronic diastolic (congestive) heart failure: Secondary | ICD-10-CM | POA: Diagnosis not present

## 2018-07-20 DIAGNOSIS — I110 Hypertensive heart disease with heart failure: Secondary | ICD-10-CM | POA: Diagnosis not present

## 2018-07-20 DIAGNOSIS — E669 Obesity, unspecified: Secondary | ICD-10-CM | POA: Diagnosis not present

## 2018-07-25 DIAGNOSIS — G4733 Obstructive sleep apnea (adult) (pediatric): Secondary | ICD-10-CM | POA: Diagnosis not present

## 2018-07-25 DIAGNOSIS — E669 Obesity, unspecified: Secondary | ICD-10-CM | POA: Diagnosis not present

## 2018-07-25 DIAGNOSIS — I5032 Chronic diastolic (congestive) heart failure: Secondary | ICD-10-CM | POA: Diagnosis not present

## 2018-07-25 DIAGNOSIS — I110 Hypertensive heart disease with heart failure: Secondary | ICD-10-CM | POA: Diagnosis not present

## 2018-07-25 DIAGNOSIS — E119 Type 2 diabetes mellitus without complications: Secondary | ICD-10-CM | POA: Diagnosis not present

## 2018-07-25 DIAGNOSIS — I2510 Atherosclerotic heart disease of native coronary artery without angina pectoris: Secondary | ICD-10-CM | POA: Diagnosis not present

## 2018-07-31 DIAGNOSIS — I2510 Atherosclerotic heart disease of native coronary artery without angina pectoris: Secondary | ICD-10-CM | POA: Diagnosis not present

## 2018-07-31 DIAGNOSIS — G4733 Obstructive sleep apnea (adult) (pediatric): Secondary | ICD-10-CM | POA: Diagnosis not present

## 2018-07-31 DIAGNOSIS — I5032 Chronic diastolic (congestive) heart failure: Secondary | ICD-10-CM | POA: Diagnosis not present

## 2018-07-31 DIAGNOSIS — E669 Obesity, unspecified: Secondary | ICD-10-CM | POA: Diagnosis not present

## 2018-07-31 DIAGNOSIS — I110 Hypertensive heart disease with heart failure: Secondary | ICD-10-CM | POA: Diagnosis not present

## 2018-07-31 DIAGNOSIS — E119 Type 2 diabetes mellitus without complications: Secondary | ICD-10-CM | POA: Diagnosis not present

## 2018-08-01 DIAGNOSIS — Z9884 Bariatric surgery status: Secondary | ICD-10-CM | POA: Diagnosis not present

## 2018-08-01 DIAGNOSIS — Z7982 Long term (current) use of aspirin: Secondary | ICD-10-CM | POA: Diagnosis not present

## 2018-08-01 DIAGNOSIS — K7581 Nonalcoholic steatohepatitis (NASH): Secondary | ICD-10-CM | POA: Diagnosis not present

## 2018-08-01 DIAGNOSIS — Z7902 Long term (current) use of antithrombotics/antiplatelets: Secondary | ICD-10-CM | POA: Diagnosis not present

## 2018-08-01 DIAGNOSIS — I110 Hypertensive heart disease with heart failure: Secondary | ICD-10-CM | POA: Diagnosis not present

## 2018-08-01 DIAGNOSIS — Z9049 Acquired absence of other specified parts of digestive tract: Secondary | ICD-10-CM | POA: Diagnosis not present

## 2018-08-01 DIAGNOSIS — E119 Type 2 diabetes mellitus without complications: Secondary | ICD-10-CM | POA: Diagnosis not present

## 2018-08-01 DIAGNOSIS — I2510 Atherosclerotic heart disease of native coronary artery without angina pectoris: Secondary | ICD-10-CM | POA: Diagnosis not present

## 2018-08-01 DIAGNOSIS — M75100 Unspecified rotator cuff tear or rupture of unspecified shoulder, not specified as traumatic: Secondary | ICD-10-CM | POA: Diagnosis not present

## 2018-08-01 DIAGNOSIS — Z6841 Body Mass Index (BMI) 40.0 and over, adult: Secondary | ICD-10-CM | POA: Diagnosis not present

## 2018-08-01 DIAGNOSIS — E669 Obesity, unspecified: Secondary | ICD-10-CM | POA: Diagnosis not present

## 2018-08-01 DIAGNOSIS — Z794 Long term (current) use of insulin: Secondary | ICD-10-CM | POA: Diagnosis not present

## 2018-08-01 DIAGNOSIS — G4733 Obstructive sleep apnea (adult) (pediatric): Secondary | ICD-10-CM | POA: Diagnosis not present

## 2018-08-01 DIAGNOSIS — E039 Hypothyroidism, unspecified: Secondary | ICD-10-CM | POA: Diagnosis not present

## 2018-08-01 DIAGNOSIS — Z9081 Acquired absence of spleen: Secondary | ICD-10-CM | POA: Diagnosis not present

## 2018-08-01 DIAGNOSIS — I5032 Chronic diastolic (congestive) heart failure: Secondary | ICD-10-CM | POA: Diagnosis not present

## 2018-08-01 DIAGNOSIS — K219 Gastro-esophageal reflux disease without esophagitis: Secondary | ICD-10-CM | POA: Diagnosis not present

## 2018-08-01 DIAGNOSIS — H9193 Unspecified hearing loss, bilateral: Secondary | ICD-10-CM | POA: Diagnosis not present

## 2018-08-01 DIAGNOSIS — E785 Hyperlipidemia, unspecified: Secondary | ICD-10-CM | POA: Diagnosis not present

## 2018-08-06 ENCOUNTER — Encounter: Payer: Self-pay | Admitting: Internal Medicine

## 2018-08-07 DIAGNOSIS — E119 Type 2 diabetes mellitus without complications: Secondary | ICD-10-CM | POA: Diagnosis not present

## 2018-08-07 DIAGNOSIS — I10 Essential (primary) hypertension: Secondary | ICD-10-CM | POA: Diagnosis not present

## 2018-08-07 DIAGNOSIS — E785 Hyperlipidemia, unspecified: Secondary | ICD-10-CM | POA: Diagnosis not present

## 2018-08-07 DIAGNOSIS — F329 Major depressive disorder, single episode, unspecified: Secondary | ICD-10-CM | POA: Diagnosis not present

## 2018-08-07 DIAGNOSIS — I2510 Atherosclerotic heart disease of native coronary artery without angina pectoris: Secondary | ICD-10-CM | POA: Diagnosis not present

## 2018-08-07 DIAGNOSIS — I209 Angina pectoris, unspecified: Secondary | ICD-10-CM | POA: Diagnosis not present

## 2018-08-07 DIAGNOSIS — I5030 Unspecified diastolic (congestive) heart failure: Secondary | ICD-10-CM | POA: Diagnosis not present

## 2018-08-07 DIAGNOSIS — I110 Hypertensive heart disease with heart failure: Secondary | ICD-10-CM | POA: Diagnosis not present

## 2018-08-07 DIAGNOSIS — I25119 Atherosclerotic heart disease of native coronary artery with unspecified angina pectoris: Secondary | ICD-10-CM | POA: Diagnosis not present

## 2018-08-07 DIAGNOSIS — E1165 Type 2 diabetes mellitus with hyperglycemia: Secondary | ICD-10-CM | POA: Diagnosis not present

## 2018-08-07 DIAGNOSIS — Z794 Long term (current) use of insulin: Secondary | ICD-10-CM | POA: Diagnosis not present

## 2018-08-08 DIAGNOSIS — I2510 Atherosclerotic heart disease of native coronary artery without angina pectoris: Secondary | ICD-10-CM | POA: Diagnosis not present

## 2018-08-08 DIAGNOSIS — G4733 Obstructive sleep apnea (adult) (pediatric): Secondary | ICD-10-CM | POA: Diagnosis not present

## 2018-08-08 DIAGNOSIS — I5032 Chronic diastolic (congestive) heart failure: Secondary | ICD-10-CM | POA: Diagnosis not present

## 2018-08-08 DIAGNOSIS — I110 Hypertensive heart disease with heart failure: Secondary | ICD-10-CM | POA: Diagnosis not present

## 2018-08-08 DIAGNOSIS — E119 Type 2 diabetes mellitus without complications: Secondary | ICD-10-CM | POA: Diagnosis not present

## 2018-08-08 DIAGNOSIS — E669 Obesity, unspecified: Secondary | ICD-10-CM | POA: Diagnosis not present

## 2018-08-13 DIAGNOSIS — M75100 Unspecified rotator cuff tear or rupture of unspecified shoulder, not specified as traumatic: Secondary | ICD-10-CM | POA: Diagnosis not present

## 2018-08-13 DIAGNOSIS — I110 Hypertensive heart disease with heart failure: Secondary | ICD-10-CM | POA: Diagnosis not present

## 2018-08-13 DIAGNOSIS — E669 Obesity, unspecified: Secondary | ICD-10-CM | POA: Diagnosis not present

## 2018-08-13 DIAGNOSIS — Z9081 Acquired absence of spleen: Secondary | ICD-10-CM

## 2018-08-13 DIAGNOSIS — E039 Hypothyroidism, unspecified: Secondary | ICD-10-CM | POA: Diagnosis not present

## 2018-08-13 DIAGNOSIS — E785 Hyperlipidemia, unspecified: Secondary | ICD-10-CM | POA: Diagnosis not present

## 2018-08-13 DIAGNOSIS — K219 Gastro-esophageal reflux disease without esophagitis: Secondary | ICD-10-CM | POA: Diagnosis not present

## 2018-08-13 DIAGNOSIS — G4733 Obstructive sleep apnea (adult) (pediatric): Secondary | ICD-10-CM | POA: Diagnosis not present

## 2018-08-13 DIAGNOSIS — Z6841 Body Mass Index (BMI) 40.0 and over, adult: Secondary | ICD-10-CM

## 2018-08-13 DIAGNOSIS — H9193 Unspecified hearing loss, bilateral: Secondary | ICD-10-CM | POA: Diagnosis not present

## 2018-08-13 DIAGNOSIS — K7581 Nonalcoholic steatohepatitis (NASH): Secondary | ICD-10-CM | POA: Diagnosis not present

## 2018-08-13 DIAGNOSIS — Z9049 Acquired absence of other specified parts of digestive tract: Secondary | ICD-10-CM

## 2018-08-13 DIAGNOSIS — E119 Type 2 diabetes mellitus without complications: Secondary | ICD-10-CM | POA: Diagnosis not present

## 2018-08-13 DIAGNOSIS — Z9884 Bariatric surgery status: Secondary | ICD-10-CM

## 2018-08-13 DIAGNOSIS — I2510 Atherosclerotic heart disease of native coronary artery without angina pectoris: Secondary | ICD-10-CM | POA: Diagnosis not present

## 2018-08-13 DIAGNOSIS — I5032 Chronic diastolic (congestive) heart failure: Secondary | ICD-10-CM | POA: Diagnosis not present

## 2018-08-13 DIAGNOSIS — Z794 Long term (current) use of insulin: Secondary | ICD-10-CM

## 2018-08-13 DIAGNOSIS — Z7902 Long term (current) use of antithrombotics/antiplatelets: Secondary | ICD-10-CM

## 2018-08-13 DIAGNOSIS — Z7982 Long term (current) use of aspirin: Secondary | ICD-10-CM

## 2018-08-14 ENCOUNTER — Ambulatory Visit: Payer: Medicare Other | Admitting: Family Medicine

## 2018-08-14 DIAGNOSIS — I5032 Chronic diastolic (congestive) heart failure: Secondary | ICD-10-CM | POA: Diagnosis not present

## 2018-08-14 DIAGNOSIS — F418 Other specified anxiety disorders: Secondary | ICD-10-CM | POA: Diagnosis not present

## 2018-08-14 DIAGNOSIS — Z794 Long term (current) use of insulin: Secondary | ICD-10-CM | POA: Diagnosis not present

## 2018-08-14 DIAGNOSIS — E119 Type 2 diabetes mellitus without complications: Secondary | ICD-10-CM | POA: Diagnosis not present

## 2018-08-14 DIAGNOSIS — I2510 Atherosclerotic heart disease of native coronary artery without angina pectoris: Secondary | ICD-10-CM | POA: Diagnosis not present

## 2018-08-14 DIAGNOSIS — I25118 Atherosclerotic heart disease of native coronary artery with other forms of angina pectoris: Secondary | ICD-10-CM | POA: Diagnosis not present

## 2018-08-14 DIAGNOSIS — G4733 Obstructive sleep apnea (adult) (pediatric): Secondary | ICD-10-CM | POA: Diagnosis not present

## 2018-08-14 DIAGNOSIS — E785 Hyperlipidemia, unspecified: Secondary | ICD-10-CM | POA: Diagnosis not present

## 2018-08-14 DIAGNOSIS — I10 Essential (primary) hypertension: Secondary | ICD-10-CM | POA: Diagnosis not present

## 2018-08-14 DIAGNOSIS — E1165 Type 2 diabetes mellitus with hyperglycemia: Secondary | ICD-10-CM | POA: Diagnosis not present

## 2018-08-14 DIAGNOSIS — E669 Obesity, unspecified: Secondary | ICD-10-CM | POA: Diagnosis not present

## 2018-08-14 DIAGNOSIS — I110 Hypertensive heart disease with heart failure: Secondary | ICD-10-CM | POA: Diagnosis not present

## 2018-08-17 ENCOUNTER — Other Ambulatory Visit: Payer: Self-pay | Admitting: Family Medicine

## 2018-08-21 ENCOUNTER — Ambulatory Visit: Payer: Medicare Other | Admitting: Family Medicine

## 2018-08-22 DIAGNOSIS — I2510 Atherosclerotic heart disease of native coronary artery without angina pectoris: Secondary | ICD-10-CM | POA: Diagnosis not present

## 2018-08-22 DIAGNOSIS — E119 Type 2 diabetes mellitus without complications: Secondary | ICD-10-CM | POA: Diagnosis not present

## 2018-08-22 DIAGNOSIS — I5032 Chronic diastolic (congestive) heart failure: Secondary | ICD-10-CM | POA: Diagnosis not present

## 2018-08-22 DIAGNOSIS — G4733 Obstructive sleep apnea (adult) (pediatric): Secondary | ICD-10-CM | POA: Diagnosis not present

## 2018-08-22 DIAGNOSIS — I110 Hypertensive heart disease with heart failure: Secondary | ICD-10-CM | POA: Diagnosis not present

## 2018-08-22 DIAGNOSIS — E669 Obesity, unspecified: Secondary | ICD-10-CM | POA: Diagnosis not present

## 2018-08-29 ENCOUNTER — Telehealth: Payer: Self-pay

## 2018-08-29 DIAGNOSIS — I2510 Atherosclerotic heart disease of native coronary artery without angina pectoris: Secondary | ICD-10-CM | POA: Diagnosis not present

## 2018-08-29 DIAGNOSIS — E119 Type 2 diabetes mellitus without complications: Secondary | ICD-10-CM | POA: Diagnosis not present

## 2018-08-29 DIAGNOSIS — G4733 Obstructive sleep apnea (adult) (pediatric): Secondary | ICD-10-CM | POA: Diagnosis not present

## 2018-08-29 DIAGNOSIS — I5032 Chronic diastolic (congestive) heart failure: Secondary | ICD-10-CM | POA: Diagnosis not present

## 2018-08-29 DIAGNOSIS — I110 Hypertensive heart disease with heart failure: Secondary | ICD-10-CM | POA: Diagnosis not present

## 2018-08-29 DIAGNOSIS — E669 Obesity, unspecified: Secondary | ICD-10-CM | POA: Diagnosis not present

## 2018-08-29 NOTE — Telephone Encounter (Signed)
Otila Kluver nurse with [facility] said a [facility] nurse sees pt once a wk. Pt saw a nurse on 08/22/18 and after nurse left pt was outside in yard and pt got hot and then dizzy;pt fell in grass in the yard. No apparent injury and no loss of consciousness but even though been 1 wk since fell has to report to PCP. No cb needed unless Dr Danise Mina has further instruction for [facility].

## 2018-08-29 NOTE — Telephone Encounter (Signed)
Noted  

## 2018-08-30 DIAGNOSIS — Z6841 Body Mass Index (BMI) 40.0 and over, adult: Secondary | ICD-10-CM | POA: Diagnosis not present

## 2018-08-30 DIAGNOSIS — Z79899 Other long term (current) drug therapy: Secondary | ICD-10-CM | POA: Diagnosis not present

## 2018-08-30 DIAGNOSIS — I5030 Unspecified diastolic (congestive) heart failure: Secondary | ICD-10-CM | POA: Diagnosis not present

## 2018-08-30 DIAGNOSIS — E669 Obesity, unspecified: Secondary | ICD-10-CM | POA: Diagnosis not present

## 2018-08-30 DIAGNOSIS — I110 Hypertensive heart disease with heart failure: Secondary | ICD-10-CM | POA: Diagnosis not present

## 2018-08-30 DIAGNOSIS — Z9861 Coronary angioplasty status: Secondary | ICD-10-CM | POA: Diagnosis not present

## 2018-08-30 DIAGNOSIS — E119 Type 2 diabetes mellitus without complications: Secondary | ICD-10-CM | POA: Diagnosis not present

## 2018-08-30 DIAGNOSIS — I2510 Atherosclerotic heart disease of native coronary artery without angina pectoris: Secondary | ICD-10-CM | POA: Diagnosis not present

## 2018-08-30 DIAGNOSIS — Z7982 Long term (current) use of aspirin: Secondary | ICD-10-CM | POA: Diagnosis not present

## 2018-08-30 DIAGNOSIS — Z794 Long term (current) use of insulin: Secondary | ICD-10-CM | POA: Diagnosis not present

## 2018-08-31 DIAGNOSIS — Z9081 Acquired absence of spleen: Secondary | ICD-10-CM | POA: Diagnosis not present

## 2018-08-31 DIAGNOSIS — E785 Hyperlipidemia, unspecified: Secondary | ICD-10-CM | POA: Diagnosis not present

## 2018-08-31 DIAGNOSIS — I5032 Chronic diastolic (congestive) heart failure: Secondary | ICD-10-CM | POA: Diagnosis not present

## 2018-08-31 DIAGNOSIS — Z7902 Long term (current) use of antithrombotics/antiplatelets: Secondary | ICD-10-CM | POA: Diagnosis not present

## 2018-08-31 DIAGNOSIS — K7581 Nonalcoholic steatohepatitis (NASH): Secondary | ICD-10-CM | POA: Diagnosis not present

## 2018-08-31 DIAGNOSIS — G4733 Obstructive sleep apnea (adult) (pediatric): Secondary | ICD-10-CM | POA: Diagnosis not present

## 2018-08-31 DIAGNOSIS — Z794 Long term (current) use of insulin: Secondary | ICD-10-CM | POA: Diagnosis not present

## 2018-08-31 DIAGNOSIS — I2510 Atherosclerotic heart disease of native coronary artery without angina pectoris: Secondary | ICD-10-CM | POA: Diagnosis not present

## 2018-08-31 DIAGNOSIS — Z9884 Bariatric surgery status: Secondary | ICD-10-CM | POA: Diagnosis not present

## 2018-08-31 DIAGNOSIS — K219 Gastro-esophageal reflux disease without esophagitis: Secondary | ICD-10-CM | POA: Diagnosis not present

## 2018-08-31 DIAGNOSIS — E039 Hypothyroidism, unspecified: Secondary | ICD-10-CM | POA: Diagnosis not present

## 2018-08-31 DIAGNOSIS — M75100 Unspecified rotator cuff tear or rupture of unspecified shoulder, not specified as traumatic: Secondary | ICD-10-CM | POA: Diagnosis not present

## 2018-08-31 DIAGNOSIS — I110 Hypertensive heart disease with heart failure: Secondary | ICD-10-CM | POA: Diagnosis not present

## 2018-08-31 DIAGNOSIS — E119 Type 2 diabetes mellitus without complications: Secondary | ICD-10-CM | POA: Diagnosis not present

## 2018-08-31 DIAGNOSIS — Z6841 Body Mass Index (BMI) 40.0 and over, adult: Secondary | ICD-10-CM | POA: Diagnosis not present

## 2018-08-31 DIAGNOSIS — Z9049 Acquired absence of other specified parts of digestive tract: Secondary | ICD-10-CM | POA: Diagnosis not present

## 2018-08-31 DIAGNOSIS — Z7982 Long term (current) use of aspirin: Secondary | ICD-10-CM | POA: Diagnosis not present

## 2018-08-31 DIAGNOSIS — H9193 Unspecified hearing loss, bilateral: Secondary | ICD-10-CM | POA: Diagnosis not present

## 2018-08-31 DIAGNOSIS — E669 Obesity, unspecified: Secondary | ICD-10-CM | POA: Diagnosis not present

## 2018-09-06 DIAGNOSIS — E669 Obesity, unspecified: Secondary | ICD-10-CM | POA: Diagnosis not present

## 2018-09-06 DIAGNOSIS — G4733 Obstructive sleep apnea (adult) (pediatric): Secondary | ICD-10-CM | POA: Diagnosis not present

## 2018-09-06 DIAGNOSIS — I110 Hypertensive heart disease with heart failure: Secondary | ICD-10-CM | POA: Diagnosis not present

## 2018-09-06 DIAGNOSIS — I5032 Chronic diastolic (congestive) heart failure: Secondary | ICD-10-CM | POA: Diagnosis not present

## 2018-09-06 DIAGNOSIS — E119 Type 2 diabetes mellitus without complications: Secondary | ICD-10-CM | POA: Diagnosis not present

## 2018-09-06 DIAGNOSIS — I2510 Atherosclerotic heart disease of native coronary artery without angina pectoris: Secondary | ICD-10-CM | POA: Diagnosis not present

## 2018-09-12 DIAGNOSIS — E1165 Type 2 diabetes mellitus with hyperglycemia: Secondary | ICD-10-CM | POA: Diagnosis not present

## 2018-09-12 DIAGNOSIS — I5032 Chronic diastolic (congestive) heart failure: Secondary | ICD-10-CM | POA: Diagnosis not present

## 2018-09-12 DIAGNOSIS — I208 Other forms of angina pectoris: Secondary | ICD-10-CM | POA: Diagnosis not present

## 2018-09-12 DIAGNOSIS — Z794 Long term (current) use of insulin: Secondary | ICD-10-CM | POA: Diagnosis not present

## 2018-09-13 ENCOUNTER — Other Ambulatory Visit: Payer: Self-pay | Admitting: Family Medicine

## 2018-09-13 DIAGNOSIS — E669 Obesity, unspecified: Secondary | ICD-10-CM | POA: Diagnosis not present

## 2018-09-13 DIAGNOSIS — G4733 Obstructive sleep apnea (adult) (pediatric): Secondary | ICD-10-CM | POA: Diagnosis not present

## 2018-09-13 DIAGNOSIS — I2510 Atherosclerotic heart disease of native coronary artery without angina pectoris: Secondary | ICD-10-CM | POA: Diagnosis not present

## 2018-09-13 DIAGNOSIS — I110 Hypertensive heart disease with heart failure: Secondary | ICD-10-CM | POA: Diagnosis not present

## 2018-09-13 DIAGNOSIS — E119 Type 2 diabetes mellitus without complications: Secondary | ICD-10-CM | POA: Diagnosis not present

## 2018-09-13 DIAGNOSIS — I5032 Chronic diastolic (congestive) heart failure: Secondary | ICD-10-CM | POA: Diagnosis not present

## 2018-09-20 DIAGNOSIS — E119 Type 2 diabetes mellitus without complications: Secondary | ICD-10-CM | POA: Diagnosis not present

## 2018-09-20 DIAGNOSIS — G4733 Obstructive sleep apnea (adult) (pediatric): Secondary | ICD-10-CM | POA: Diagnosis not present

## 2018-09-20 DIAGNOSIS — I2510 Atherosclerotic heart disease of native coronary artery without angina pectoris: Secondary | ICD-10-CM | POA: Diagnosis not present

## 2018-09-20 DIAGNOSIS — I110 Hypertensive heart disease with heart failure: Secondary | ICD-10-CM | POA: Diagnosis not present

## 2018-09-20 DIAGNOSIS — I5032 Chronic diastolic (congestive) heart failure: Secondary | ICD-10-CM | POA: Diagnosis not present

## 2018-09-20 DIAGNOSIS — E669 Obesity, unspecified: Secondary | ICD-10-CM | POA: Diagnosis not present

## 2018-09-25 ENCOUNTER — Other Ambulatory Visit: Payer: Self-pay

## 2018-09-25 DIAGNOSIS — G4733 Obstructive sleep apnea (adult) (pediatric): Secondary | ICD-10-CM | POA: Diagnosis not present

## 2018-09-25 DIAGNOSIS — I5032 Chronic diastolic (congestive) heart failure: Secondary | ICD-10-CM | POA: Diagnosis not present

## 2018-09-25 DIAGNOSIS — E669 Obesity, unspecified: Secondary | ICD-10-CM | POA: Diagnosis not present

## 2018-09-25 DIAGNOSIS — E119 Type 2 diabetes mellitus without complications: Secondary | ICD-10-CM | POA: Diagnosis not present

## 2018-09-25 DIAGNOSIS — I2510 Atherosclerotic heart disease of native coronary artery without angina pectoris: Secondary | ICD-10-CM | POA: Diagnosis not present

## 2018-09-25 DIAGNOSIS — I110 Hypertensive heart disease with heart failure: Secondary | ICD-10-CM | POA: Diagnosis not present

## 2018-09-25 MED ORDER — ULTICARE MINI PEN NEEDLES 31G X 6 MM MISC: 3 refills | Status: DC

## 2018-09-30 DIAGNOSIS — E785 Hyperlipidemia, unspecified: Secondary | ICD-10-CM | POA: Diagnosis not present

## 2018-09-30 DIAGNOSIS — E669 Obesity, unspecified: Secondary | ICD-10-CM | POA: Diagnosis not present

## 2018-09-30 DIAGNOSIS — K219 Gastro-esophageal reflux disease without esophagitis: Secondary | ICD-10-CM | POA: Diagnosis not present

## 2018-09-30 DIAGNOSIS — Z7902 Long term (current) use of antithrombotics/antiplatelets: Secondary | ICD-10-CM | POA: Diagnosis not present

## 2018-09-30 DIAGNOSIS — Z6841 Body Mass Index (BMI) 40.0 and over, adult: Secondary | ICD-10-CM | POA: Diagnosis not present

## 2018-09-30 DIAGNOSIS — Z7982 Long term (current) use of aspirin: Secondary | ICD-10-CM | POA: Diagnosis not present

## 2018-09-30 DIAGNOSIS — G4733 Obstructive sleep apnea (adult) (pediatric): Secondary | ICD-10-CM | POA: Diagnosis not present

## 2018-09-30 DIAGNOSIS — H9193 Unspecified hearing loss, bilateral: Secondary | ICD-10-CM | POA: Diagnosis not present

## 2018-09-30 DIAGNOSIS — E039 Hypothyroidism, unspecified: Secondary | ICD-10-CM | POA: Diagnosis not present

## 2018-09-30 DIAGNOSIS — Z9884 Bariatric surgery status: Secondary | ICD-10-CM | POA: Diagnosis not present

## 2018-09-30 DIAGNOSIS — I5032 Chronic diastolic (congestive) heart failure: Secondary | ICD-10-CM | POA: Diagnosis not present

## 2018-09-30 DIAGNOSIS — Z9049 Acquired absence of other specified parts of digestive tract: Secondary | ICD-10-CM | POA: Diagnosis not present

## 2018-09-30 DIAGNOSIS — K7581 Nonalcoholic steatohepatitis (NASH): Secondary | ICD-10-CM | POA: Diagnosis not present

## 2018-09-30 DIAGNOSIS — E119 Type 2 diabetes mellitus without complications: Secondary | ICD-10-CM | POA: Diagnosis not present

## 2018-09-30 DIAGNOSIS — I110 Hypertensive heart disease with heart failure: Secondary | ICD-10-CM | POA: Diagnosis not present

## 2018-09-30 DIAGNOSIS — Z9081 Acquired absence of spleen: Secondary | ICD-10-CM | POA: Diagnosis not present

## 2018-09-30 DIAGNOSIS — Z794 Long term (current) use of insulin: Secondary | ICD-10-CM | POA: Diagnosis not present

## 2018-09-30 DIAGNOSIS — I2510 Atherosclerotic heart disease of native coronary artery without angina pectoris: Secondary | ICD-10-CM | POA: Diagnosis not present

## 2018-10-04 DIAGNOSIS — E119 Type 2 diabetes mellitus without complications: Secondary | ICD-10-CM | POA: Diagnosis not present

## 2018-10-04 DIAGNOSIS — I5032 Chronic diastolic (congestive) heart failure: Secondary | ICD-10-CM | POA: Diagnosis not present

## 2018-10-04 DIAGNOSIS — E669 Obesity, unspecified: Secondary | ICD-10-CM | POA: Diagnosis not present

## 2018-10-04 DIAGNOSIS — I110 Hypertensive heart disease with heart failure: Secondary | ICD-10-CM | POA: Diagnosis not present

## 2018-10-04 DIAGNOSIS — G4733 Obstructive sleep apnea (adult) (pediatric): Secondary | ICD-10-CM | POA: Diagnosis not present

## 2018-10-04 DIAGNOSIS — I2510 Atherosclerotic heart disease of native coronary artery without angina pectoris: Secondary | ICD-10-CM | POA: Diagnosis not present

## 2018-10-08 ENCOUNTER — Other Ambulatory Visit: Payer: Self-pay

## 2018-10-08 ENCOUNTER — Encounter: Payer: Self-pay | Admitting: Family Medicine

## 2018-10-08 ENCOUNTER — Ambulatory Visit (INDEPENDENT_AMBULATORY_CARE_PROVIDER_SITE_OTHER): Payer: Medicare Other | Admitting: Family Medicine

## 2018-10-08 VITALS — BP 130/82 | HR 82 | Temp 98.5°F | Ht 70.0 in | Wt 329.4 lb

## 2018-10-08 DIAGNOSIS — K3184 Gastroparesis: Secondary | ICD-10-CM

## 2018-10-08 DIAGNOSIS — I2510 Atherosclerotic heart disease of native coronary artery without angina pectoris: Secondary | ICD-10-CM | POA: Diagnosis not present

## 2018-10-08 DIAGNOSIS — N476 Balanoposthitis: Secondary | ICD-10-CM

## 2018-10-08 DIAGNOSIS — E1165 Type 2 diabetes mellitus with hyperglycemia: Secondary | ICD-10-CM | POA: Diagnosis not present

## 2018-10-08 DIAGNOSIS — IMO0002 Reserved for concepts with insufficient information to code with codable children: Secondary | ICD-10-CM

## 2018-10-08 DIAGNOSIS — E11319 Type 2 diabetes mellitus with unspecified diabetic retinopathy without macular edema: Secondary | ICD-10-CM | POA: Diagnosis not present

## 2018-10-08 DIAGNOSIS — E1143 Type 2 diabetes mellitus with diabetic autonomic (poly)neuropathy: Secondary | ICD-10-CM | POA: Diagnosis not present

## 2018-10-08 DIAGNOSIS — E1142 Type 2 diabetes mellitus with diabetic polyneuropathy: Secondary | ICD-10-CM | POA: Diagnosis not present

## 2018-10-08 MED ORDER — FLUCONAZOLE 150 MG PO TABS: 150.0000 mg | ORAL_TABLET | Freq: Once | ORAL | 0 refills | Status: DC

## 2018-10-08 MED ORDER — CLOTRIMAZOLE 1 % EX CREA: 1.0000 "application " | TOPICAL_CREAM | Freq: Two times a day (BID) | CUTANEOUS | 0 refills | Status: DC

## 2018-10-08 NOTE — Patient Instructions (Addendum)
You have a yeast infection with swelling. Wilder Glade could contribute to this.  Treat with antifungal cream and pill sent to pharmacy. Recheck in 1 week.  If at any time trouble making urine, let us know right away.

## 2018-10-08 NOTE — Progress Notes (Signed)
This visit was conducted in person.  BP 130/82 (BP Location: Left Arm, Patient Position: Sitting, Cuff Size: Large)   Pulse 82   Temp 98.5 F (36.9 C) (Temporal)   Ht 5' 10"  (1.778 m)   Wt (!) 329 lb 6 oz (149.4 kg)   SpO2 98%   BMI 47.26 kg/m    CC: swelling of penis Subjective:    Patient ID: Chad [name], male    DOB: [birth year], 63 y.o.   MRN: [id number]  HPI: [name] is a 64 y.o. male presenting on 10/08/2018 for Groin Swelling (C/o penis swelling and redness. Noticed about 1 mo ago. Denies any urinary issues. )   Clarified - no dysuria endorsed.   73moh/o swelling to penis and privates. Scrotum is very red and swollen. Stinging and burning. Able to void freely without hesitancy or incomplete emptying. No dysuria, frequency, hematuria.   Wife has been treating with barrier cream without much benefit.  He has been wearing pullups.  He started farxiga 07/2018.  Chronic urinary urgency.   Known chronic HFpEF, CAD with stable angina, [facility] recently increased spironolactone to 217mdaily, bumex 2m72mn am daily and 2mg31m PM on MWF, recently started SGLT2 inhibitor (farxiga).   Has had 2 falls in last several weeks.      Relevant past medical, surgical, family and social history reviewed and updated as indicated. Interim medical history since our last visit reviewed. Allergies and medications reviewed and updated. Outpatient Medications Prior to Visit  Medication Sig Dispense Refill  . albuterol (PROVENTIL HFA;VENTOLIN HFA) 108 (90 Base) MCG/ACT inhaler INHALE 2 PUFFS INTO THE LUNGS EVERY 6 HOURS AS NEEDED FOR WHEEZING ORSHORTNESS OF BREATH 18 g 6  . amLODipine (NORVASC) 5 MG tablet Take 1 tablet (5 mg total) by mouth daily.    . asMarland Kitchenirin 81 MG chewable tablet Chew 81 mg by mouth daily.     . atMarland Kitchenrvastatin (LIPITOR) 20 MG tablet Take 1 tablet (20 mg total) by mouth daily.    . bumetanide (BUMEX) 2 MG tablet Take 1 tablet (2 mg total) by  mouth 2 (two) times daily as needed (weight gain/leg swelling).    . citalopram (CELEXA) 20 MG tablet Take 0.5 tablets (10 mg total) by mouth daily.    . clopidogrel (PLAVIX) 75 MG tablet Take 1 tablet (75 mg total) by mouth daily.    . clotrimazole-betamethasone (LOTRISONE) cream Apply 1 application topically 2 (two) times daily. 30 g 0  . DEXILANT 60 MG capsule TAKE 1 CAPSULE BY MOUTH ONCE DAILY (Patient taking differently: Take 60 mg by mouth daily. ) 30 capsule 5  . diclofenac sodium (VOLTAREN) 1 % GEL Apply 1 application topically 3 (three) times daily. 1 Tube 1  . Docusate Sodium (DSS) 100 MG CAPS 1 tab 2 times a day while on narcotics.  STOOL SOFTENER 60 each 0  . famotidine (PEPCID) 20 MG tablet TAKE 1 TABLET BY MOUTH EVERY NIGHT AT BEDTIME 90 tablet 1  . fluticasone (FLONASE) 50 MCG/ACT nasal spray PLACE 2 SPRAYS INTO BOTH NOSTRILS DAILY 16 g 2  . Insulin Pen Needle (ULTICARE MINI PEN NEEDLES) 31G X 6 MM MISC USE AS DIRECTED FOR LANTUS SOLOSTAR PEN 100 each 3  . isosorbide mononitrate (IMDUR) 60 MG 24 hr tablet TAKE 3 TABS BY MOUTH ONCE DAILY 90 tablet 5  . LANTUS SOLOSTAR 100 UNIT/ML Solostar Pen INJECT 50 UNITS INTO THE SKIN TWICE DAILY 30 mL 5  .  levothyroxine (SYNTHROID, LEVOTHROID) 125 MCG tablet TAKE 1 TABLET BY MOUTH ONCE DAILY 30 tablet 3  . lisinopril (PRINIVIL,ZESTRIL) 20 MG tablet Take 1 tablet (20 mg total) by mouth daily. 90 tablet 3  . LORazepam (ATIVAN) 0.5 MG tablet Take 0.5-1 tablets (0.25-0.5 mg total) by mouth 2 (two) times daily as needed for anxiety. 30 tablet 1  . magnesium oxide (MAG-OX) 400 MG tablet Take 1 tablet by mouth 2 (two) times daily.    . metFORMIN (GLUCOPHAGE) 1000 MG tablet TAKE 1 TABLET BY MOUTH TWICE A DAY WITH A MEAL 180 tablet 1  . metoCLOPramide (REGLAN) 10 MG tablet TAKE 1 TABLET BY MOUTH 3 TIMES DAILY BEFORE MEALS 90 tablet 5  . metolazone (ZAROXOLYN) 2.5 MG tablet Take 1 tablet (2.5 mg total) by mouth daily as needed (for more than 3lb/day or  5lb/wk weight gain).    . metoprolol succinate (TOPROL-XL) 50 MG 24 hr tablet TAKE 1 TABLET BY MOUTH ONCE DAILY 90 tablet 1  . Multiple Vitamin (MULITIVITAMIN WITH MINERALS) TABS Take 1 tablet by mouth daily.    . nitroGLYCERIN (NITROSTAT) 0.4 MG SL tablet Place 1 tablet (0.4 mg total) under the tongue every 5 (five) minutes as needed for chest pain. 60 tablet 3  . polyethylene glycol (MIRALAX / GLYCOLAX) packet 17grams in 16 oz of water twice a day until bowel movement.  LAXITIVE.  Restart if two days since last bowel movement 14 each 0  . potassium chloride (K-DUR,KLOR-CON) 10 MEQ tablet Take 10 mEq by mouth 2 (two) times daily.    . sucralfate (CARAFATE) 1 g tablet TAKE 1 TABLET BY MOUTH TWICE A DAY BEFORE A MEAL *REPLACING PEPCID* 60 tablet 1  . vitamin E 400 UNIT capsule Take 400 Units by mouth daily.    . dapagliflozin propanediol (FARXIGA) 5 MG TABS tablet Take 5 mg by mouth daily before breakfast.     No facility-administered medications prior to visit.      Per HPI unless specifically indicated in ROS section below Review of Systems Objective:    BP 130/82 (BP Location: Left Arm, Patient Position: Sitting, Cuff Size: Large)   Pulse 82   Temp 98.5 F (36.9 C) (Temporal)   Ht 5' 10"  (1.778 m)   Wt (!) 329 lb 6 oz (149.4 kg)   SpO2 98%   BMI 47.26 kg/m   Wt Readings from Last 3 Encounters:  10/08/18 (!) 329 lb 6 oz (149.4 kg)  06/13/18 (!) 320 lb (145.2 kg)  05/17/18 (!) 341 lb (154.7 kg)    Physical Exam Vitals signs and nursing note reviewed.  Constitutional:      General: He is not in acute distress.    Appearance: Normal appearance. He is obese. He is ill-appearing.     Comments: Chronic dyspnea, walks with walker  Abdominal:     Hernia: There is no hernia in the left inguinal area or right inguinal area.  Genitourinary:    Pubic Area: Rash present.     Penis: Phimosis, erythema, tenderness, discharge and swelling present.      Scrotum/Testes: Normal.        Comments: Swelling of foreskin with resultant phimosis. Erythematous scaly rash at pubic region above penile shaft.  Lymphadenopathy:     Lower Body: No right inguinal adenopathy. No left inguinal adenopathy.  Neurological:     Mental Status: He is alert.        Lab Results  Component Value Date   HGBA1C 8.5 (A) 05/01/2018  Assessment & Plan:   Problem List Items Addressed This Visit    Type 2 diabetes mellitus, uncontrolled, with retinopathy ([place])    Sugar log they bring was reviewed - improving glycemic control (fasting 120-170s) with recent addition of farxiga. Will need A1c checked next visit.       Relevant Medications   dapagliflozin propanediol (FARXIGA) 5 MG TABS tablet   Diabetic neuropathy associated with type 2 diabetes mellitus (HCC)   Relevant Medications   dapagliflozin propanediol (FARXIGA) 5 MG TABS tablet   Diabetic gastroparesis (HCC)   Relevant Medications   dapagliflozin propanediol (FARXIGA) 5 MG TABS tablet   Balanoposthitis - Primary    Anticipate candidal balanitis with phimosis. Obesity, diabetes, recent commencement of farxiga contributing. Discussed relation of farxiga to recurrent UTIs, yeast infections, and necrotizing fasciitis. Rx diflucan 180m x1 orally then start lotrimin cream BID. Close f/u 1 wk. Discussed red flags to notify [place] or to seek urgent medical care if over weekend. Pt/wife agree with plan.           Meds ordered this encounter  Medications  . fluconazole (DIFLUCAN) 150 MG tablet    Sig: Take 1 tablet (150 mg total) by mouth once for 1 dose.    Dispense:  1 tablet    Refill:  0  . clotrimazole (LOTRIMIN) 1 % cream    Sig: Apply 1 application topically 2 (two) times daily.    Dispense:  60 g    Refill:  0   No orders of the defined types were placed in this encounter.  Patient Instructions  You have a yeast infection with swelling. FWilder Gladecould contribute to this.  Treat with antifungal cream and  pill sent to pharmacy. Recheck in 1 week.  If at any time trouble making urine, let [place] right away.    Follow up plan: Return in about 1 week (around 10/15/2018) for follow up visit.  JRia Bush MD

## 2018-10-09 ENCOUNTER — Encounter: Payer: Self-pay | Admitting: Family Medicine

## 2018-10-09 NOTE — Assessment & Plan Note (Signed)
Anticipate candidal balanitis with phimosis. Obesity, diabetes, recent commencement of farxiga contributing. Discussed relation of farxiga to recurrent UTIs, yeast infections, and necrotizing fasciitis. Rx diflucan 117m x1 orally then start lotrimin cream BID. Close f/u 1 wk. Discussed red flags to notify [place] referral or to seek urgent medical care if over weekend. Pt/wife agree with plan.

## 2018-10-09 NOTE — Assessment & Plan Note (Addendum)
Sugar log they bring was reviewed - improving glycemic control (fasting 120-170s) with recent addition of farxiga. Will need A1c checked next visit.

## 2018-10-10 ENCOUNTER — Encounter: Payer: Self-pay | Admitting: *Deleted

## 2018-10-11 ENCOUNTER — Other Ambulatory Visit: Payer: Self-pay | Admitting: Family Medicine

## 2018-10-12 DIAGNOSIS — H34812 Central retinal vein occlusion, left eye, with macular edema: Secondary | ICD-10-CM | POA: Diagnosis not present

## 2018-10-12 DIAGNOSIS — E113291 Type 2 diabetes mellitus with mild nonproliferative diabetic retinopathy without macular edema, right eye: Secondary | ICD-10-CM | POA: Diagnosis not present

## 2018-10-12 DIAGNOSIS — H43813 Vitreous degeneration, bilateral: Secondary | ICD-10-CM | POA: Diagnosis not present

## 2018-10-12 DIAGNOSIS — H2511 Age-related nuclear cataract, right eye: Secondary | ICD-10-CM | POA: Diagnosis not present

## 2018-10-12 NOTE — Telephone Encounter (Signed)
LOV 10/08/2018 and future appointment on 10/15/2018

## 2018-10-15 ENCOUNTER — Ambulatory Visit (INDEPENDENT_AMBULATORY_CARE_PROVIDER_SITE_OTHER): Payer: Medicare Other | Admitting: Family Medicine

## 2018-10-15 ENCOUNTER — Encounter: Payer: Self-pay | Admitting: Internal Medicine

## 2018-10-15 ENCOUNTER — Encounter: Payer: Self-pay | Admitting: Family Medicine

## 2018-10-15 ENCOUNTER — Ambulatory Visit (INDEPENDENT_AMBULATORY_CARE_PROVIDER_SITE_OTHER)
Admission: RE | Admit: 2018-10-15 | Discharge: 2018-10-15 | Disposition: A | Discharge status: Home / Self Care | Payer: Medicare Other | Source: Ambulatory Visit | Attending: Family Medicine | Admitting: Family Medicine

## 2018-10-15 ENCOUNTER — Other Ambulatory Visit: Payer: Self-pay

## 2018-10-15 VITALS — BP 122/64 | HR 72 | Temp 98.5°F | Ht 70.0 in | Wt 328.6 lb

## 2018-10-15 DIAGNOSIS — M542 Cervicalgia: Secondary | ICD-10-CM

## 2018-10-15 DIAGNOSIS — I2510 Atherosclerotic heart disease of native coronary artery without angina pectoris: Secondary | ICD-10-CM | POA: Diagnosis not present

## 2018-10-15 DIAGNOSIS — G959 Disease of spinal cord, unspecified: Secondary | ICD-10-CM | POA: Insufficient documentation

## 2018-10-15 DIAGNOSIS — Z23 Encounter for immunization: Secondary | ICD-10-CM

## 2018-10-15 DIAGNOSIS — M5030 Other cervical disc degeneration, unspecified cervical region: Secondary | ICD-10-CM | POA: Insufficient documentation

## 2018-10-15 DIAGNOSIS — E11319 Type 2 diabetes mellitus with unspecified diabetic retinopathy without macular edema: Secondary | ICD-10-CM

## 2018-10-15 DIAGNOSIS — IMO0002 Reserved for concepts with insufficient information to code with codable children: Secondary | ICD-10-CM

## 2018-10-15 DIAGNOSIS — N476 Balanoposthitis: Secondary | ICD-10-CM | POA: Diagnosis not present

## 2018-10-15 DIAGNOSIS — E1165 Type 2 diabetes mellitus with hyperglycemia: Secondary | ICD-10-CM

## 2018-10-15 MED ORDER — FLUCONAZOLE 150 MG PO TABS: 150.0000 mg | ORAL_TABLET | Freq: Every day | ORAL | 0 refills | Status: DC

## 2018-10-15 MED ORDER — DIAZEPAM 5 MG PO TABS: 5.0000 mg | ORAL_TABLET | Freq: Two times a day (BID) | ORAL | 1 refills | Status: DC | PRN

## 2018-10-15 NOTE — Progress Notes (Signed)
This visit was conducted in person.  BP 122/64 (BP Location: Left Arm, Patient Position: Sitting, Cuff Size: Large)   Pulse 72   Temp 98.5 F (36.9 C) (Temporal)   Ht 5' 10"  (1.778 m)   Wt (!) 328 lb 9 oz (149 kg)   SpO2 98%   BMI 47.14 kg/m    CC: 1 wk f/u visit Subjective:    Patient ID: Chad [name], male    DOB: 1955-12-23, 63 y.o.   MRN: [id number]  HPI: Chad [name] is a 63 y.o. male presenting on 10/15/2018 for Follow-up (Here for 1 wk f/u.)   See prior note for details. Seen here last week with dx balanoposthitis treated with diflucan x1 and lotrimin cream. Significant improvement in symptoms, swelling has improved, as well as voiding has improved. Mild itching but better. Chronic urinary urgency.   He did recently start [place].   Has had several falls recently. Had syncopal spell several weeks ago, may have injured neck. Recently noticing numbness of L neck into L shoulder whenever he turns head to the left.      Relevant past medical, surgical, family and social history reviewed and updated as indicated. Interim medical history since our last visit reviewed. Allergies and medications reviewed and updated. Outpatient Medications Prior to Visit  Medication Sig Dispense Refill  . albuterol (PROVENTIL HFA;VENTOLIN HFA) 108 (90 Base) MCG/ACT inhaler INHALE 2 PUFFS INTO THE LUNGS EVERY 6 HOURS AS NEEDED FOR WHEEZING ORSHORTNESS OF BREATH 18 g 6  . amLODipine (NORVASC) 5 MG tablet Take 1 tablet (5 mg total) by mouth daily.    Marland Kitchen aspirin 81 MG chewable tablet Chew 81 mg by mouth daily.     Marland Kitchen atorvastatin (LIPITOR) 20 MG tablet Take 1 tablet (20 mg total) by mouth daily.    . bumetanide (BUMEX) 2 MG tablet Take 1 tablet (2 mg total) by mouth 2 (two) times daily as needed (weight gain/leg swelling).    . citalopram (CELEXA) 20 MG tablet Take 0.5 tablets (10 mg total) by mouth daily.    . clopidogrel (PLAVIX) 75 MG tablet Take 1 tablet (75 mg total) by mouth daily.    .  clotrimazole (LOTRIMIN) 1 % cream Apply 1 application topically 2 (two) times daily. 60 g 0  . clotrimazole-betamethasone (LOTRISONE) cream Apply 1 application topically 2 (two) times daily. 30 g 0  . dapagliflozin propanediol (FARXIGA) 5 MG TABS tablet Take 5 mg by mouth daily before breakfast.    . DEXILANT 60 MG capsule TAKE 1 CAPSULE BY MOUTH ONCE DAILY (Patient taking differently: Take 60 mg by mouth daily. ) 30 capsule 5  . diclofenac sodium (VOLTAREN) 1 % GEL Apply 1 application topically 3 (three) times daily. 1 Tube 1  . Docusate Sodium (DSS) 100 MG CAPS 1 tab 2 times a day while on narcotics.  STOOL SOFTENER 60 each 0  . famotidine (PEPCID) 20 MG tablet TAKE 1 TABLET BY MOUTH EVERY NIGHT AT BEDTIME 90 tablet 1  . fluticasone (FLONASE) 50 MCG/ACT nasal spray PLACE 2 SPRAYS INTO BOTH NOSTRILS DAILY 16 g 2  . Insulin Pen Needle (ULTICARE MINI PEN NEEDLES) 31G X 6 MM MISC USE AS DIRECTED FOR LANTUS SOLOSTAR PEN 100 each 3  . isosorbide mononitrate (IMDUR) 60 MG 24 hr tablet TAKE 3 TABS BY MOUTH ONCE DAILY 90 tablet 5  . LANTUS SOLOSTAR 100 UNIT/ML Solostar Pen INJECT 50 UNITS INTO THE SKIN TWICE DAILY 30 mL 5  . levothyroxine (SYNTHROID,  LEVOTHROID) 125 MCG tablet TAKE 1 TABLET BY MOUTH ONCE DAILY 30 tablet 3  . lisinopril (PRINIVIL,ZESTRIL) 20 MG tablet Take 1 tablet (20 mg total) by mouth daily. 90 tablet 3  . LORazepam (ATIVAN) 0.5 MG tablet Take 0.5-1 tablets (0.25-0.5 mg total) by mouth 2 (two) times daily as needed for anxiety. 30 tablet 1  . magnesium oxide (MAG-OX) 400 MG tablet Take 1 tablet by mouth 2 (two) times daily.    . metFORMIN (GLUCOPHAGE) 1000 MG tablet TAKE 1 TABLET BY MOUTH TWICE A DAY WITH A MEAL 180 tablet 1  . metoCLOPramide (REGLAN) 10 MG tablet TAKE 1 TABLET BY MOUTH 3 TIMES DAILY BEFORE MEALS 90 tablet 5  . metolazone (ZAROXOLYN) 2.5 MG tablet Take 1 tablet (2.5 mg total) by mouth daily as needed (for more than 3lb/day or 5lb/wk weight gain).    . metoprolol  succinate (TOPROL-XL) 50 MG 24 hr tablet TAKE 1 TABLET BY MOUTH ONCE DAILY 90 tablet 1  . Multiple Vitamin (MULITIVITAMIN WITH MINERALS) TABS Take 1 tablet by mouth daily.    . nitroGLYCERIN (NITROSTAT) 0.4 MG SL tablet Place 1 tablet (0.4 mg total) under the tongue every 5 (five) minutes as needed for chest pain. 60 tablet 3  . polyethylene glycol (MIRALAX / GLYCOLAX) packet 17grams in 16 oz of water twice a day until bowel movement.  LAXITIVE.  Restart if two days since last bowel movement 14 each 0  . potassium chloride (K-DUR,KLOR-CON) 10 MEQ tablet Take 10 mEq by mouth 2 (two) times daily.    Marland Kitchen spironolactone (ALDACTONE) 25 MG tablet Take 1 tablet by mouth daily.    . sucralfate (CARAFATE) 1 g tablet TAKE 1 TABLET BY MOUTH TWICE (2) DAILY BEFORE A MEAL 60 tablet 1  . vitamin E 400 UNIT capsule Take 400 Units by mouth daily.    . fluconazole (DIFLUCAN) 150 MG tablet Take 1 tablet by mouth daily.     No facility-administered medications prior to visit.      Per HPI unless specifically indicated in ROS section below Review of Systems Objective:    BP 122/64 (BP Location: Left Arm, Patient Position: Sitting, Cuff Size: Large)   Pulse 72   Temp 98.5 F (36.9 C) (Temporal)   Ht 5' 10"  (1.778 m)   Wt (!) 328 lb 9 oz (149 kg)   SpO2 98%   BMI 47.14 kg/m   Wt Readings from Last 3 Encounters:  10/15/18 (!) 328 lb 9 oz (149 kg)  10/08/18 (!) 329 lb 6 oz (149.4 kg)  06/13/18 (!) 320 lb (145.2 kg)    Physical Exam Vitals signs and nursing note reviewed.  Constitutional:      General: He is not in acute distress.    Appearance: He is obese. He is ill-appearing.     Comments: Walks with rolling walker  Neck:     Musculoskeletal: Neck supple. No neck rigidity.     Comments: Tender to palpation midline lower cervical spine and along L paraspinous mm Pulmonary:     Comments: Chronically shortwinded Genitourinary:    Pubic Area: Rash (improved) present.     Penis: Phimosis present.       Scrotum/Testes: Normal.        Right: Mass, tenderness or swelling not present.        Left: Mass, tenderness or swelling not present.       Comments:  Improved erythema/edema from last week Resolving scaling erythema above penis Unable to retract foreskin to  evaluate penis Musculoskeletal:     Comments:  No pain to palpation of L shoulder FROM shoulders in abduction  Neurological:     Mental Status: He is alert.     Sensory: Sensation is intact.     Motor: Motor function is intact.     Comments: Grip strength intact       DG Cervical Spine Complete CLINICAL DATA:  Neck pain.  Left arm numbness.  EXAM: CERVICAL SPINE - COMPLETE 4+ VIEW  COMPARISON:  Chest x-ray 05/17/2018.  FINDINGS: Lower cervical spine incompletely imaged. No acute soft tissue bony abnormality. No evidence of fracture or dislocation. Diffuse multilevel degenerative change. Ligamentous ossification. Pulmonary apices are clear. Carotid vascular calcification cannot be excluded.  IMPRESSION: 1. Lower cervical spine incompletely imaged. Diffuse multilevel degenerative change. No acute bony abnormality.  2.  Carotid vascular calcification cannot be excluded  Electronically Signed   By: Marcello Moores  Register   On: 10/15/2018 11:27   Assessment & Plan:   Problem List Items Addressed This Visit    Type 2 diabetes mellitus, uncontrolled, with retinopathy ([place])    Monitor glycemic control on farxiga, monitor for recurrent yeast infections.       Neck pain on left side    Midline neck pain after fall several weeks ago, with new L neck/shoulder numbness when laterally rotating head to left. Check cervical films.       Relevant Orders   DG Cervical Spine Complete (Completed)   Balanoposthitis - Primary    Overall improved on current regimen. Still with phimosis, unable to fully evaluate penis. Will retreat with 144m diflucan x1 and continue lotrimin. Pt agrees with plan.  Unable to do warm sitz bath.         Other Visit Diagnoses    Need for influenza vaccination       Relevant Orders   Flu Vaccine QUAD 36+ mos IM (Completed)       Meds ordered this encounter  Medications  . diazepam (VALIUM) 5 MG tablet    Sig: Take 1 tablet (5 mg total) by mouth every 12 (twelve) hours as needed for anxiety.    Dispense:  30 tablet    Refill:  1  . fluconazole (DIFLUCAN) 150 MG tablet    Sig: Take 1 tablet (150 mg total) by mouth daily.    Dispense:  1 tablet    Refill:  0   Orders Placed This Encounter  Procedures  . DG Cervical Spine Complete    Standing Status:   Future    Number of Occurrences:   1    Standing Expiration Date:   12/15/2019    Order Specific Question:   Reason for Exam (SYMPTOM  OR DIAGNOSIS REQUIRED)    Answer:   neck pain with L arm numbness after fall 3 wks ago    Order Specific Question:   [facility]    [facility]   Order Specific Question:   Radiology Contrast Protocol - do NOT remove file path    Answer:   [email or web address]  . Flu Vaccine QUAD 36+ mos IM    Follow up plan: Return if symptoms worsen or fail to improve.  JRia Bush MD

## 2018-10-15 NOTE — Assessment & Plan Note (Signed)
Midline neck pain after fall several weeks ago, with new L neck/shoulder numbness when laterally rotating head to left. Check cervical films.

## 2018-10-15 NOTE — Assessment & Plan Note (Signed)
Overall improved on current regimen. Still with phimosis, unable to fully evaluate penis. Will retreat with 156m diflucan x1 and continue lotrimin. Pt agrees with plan.  Unable to do warm sitz bath.

## 2018-10-15 NOTE — Patient Instructions (Signed)
Take one more diflucan pill sent to pharmacy.  Continue lotrimin cream twice daily to private area, if you can try to get some on penis itself.  Xrays of neck today.

## 2018-10-15 NOTE — Assessment & Plan Note (Signed)
Monitor glycemic control on farxiga, monitor for recurrent yeast infections.

## 2018-10-16 ENCOUNTER — Other Ambulatory Visit: Payer: Self-pay | Admitting: Family Medicine

## 2018-10-16 ENCOUNTER — Ambulatory Visit (INDEPENDENT_AMBULATORY_CARE_PROVIDER_SITE_OTHER): Payer: Medicare Other | Admitting: Internal Medicine

## 2018-10-16 ENCOUNTER — Encounter: Payer: Self-pay | Admitting: Internal Medicine

## 2018-10-16 VITALS — Ht 70.0 in | Wt 328.0 lb

## 2018-10-16 DIAGNOSIS — E785 Hyperlipidemia, unspecified: Secondary | ICD-10-CM | POA: Diagnosis not present

## 2018-10-16 DIAGNOSIS — I25119 Atherosclerotic heart disease of native coronary artery with unspecified angina pectoris: Secondary | ICD-10-CM

## 2018-10-16 DIAGNOSIS — K3184 Gastroparesis: Secondary | ICD-10-CM

## 2018-10-16 DIAGNOSIS — K7460 Unspecified cirrhosis of liver: Secondary | ICD-10-CM | POA: Diagnosis not present

## 2018-10-16 DIAGNOSIS — K219 Gastro-esophageal reflux disease without esophagitis: Secondary | ICD-10-CM

## 2018-10-16 DIAGNOSIS — I509 Heart failure, unspecified: Secondary | ICD-10-CM | POA: Diagnosis not present

## 2018-10-16 DIAGNOSIS — E039 Hypothyroidism, unspecified: Secondary | ICD-10-CM | POA: Diagnosis not present

## 2018-10-16 DIAGNOSIS — R200 Anesthesia of skin: Secondary | ICD-10-CM

## 2018-10-16 DIAGNOSIS — K760 Fatty (change of) liver, not elsewhere classified: Secondary | ICD-10-CM

## 2018-10-16 DIAGNOSIS — Z86010 Personal history of colonic polyps: Secondary | ICD-10-CM

## 2018-10-16 DIAGNOSIS — I5032 Chronic diastolic (congestive) heart failure: Secondary | ICD-10-CM | POA: Diagnosis not present

## 2018-10-16 DIAGNOSIS — K766 Portal hypertension: Secondary | ICD-10-CM | POA: Diagnosis not present

## 2018-10-16 DIAGNOSIS — H9193 Unspecified hearing loss, bilateral: Secondary | ICD-10-CM | POA: Diagnosis not present

## 2018-10-16 DIAGNOSIS — Z794 Long term (current) use of insulin: Secondary | ICD-10-CM

## 2018-10-16 DIAGNOSIS — E119 Type 2 diabetes mellitus without complications: Secondary | ICD-10-CM | POA: Diagnosis not present

## 2018-10-16 DIAGNOSIS — Z6841 Body Mass Index (BMI) 40.0 and over, adult: Secondary | ICD-10-CM | POA: Diagnosis not present

## 2018-10-16 DIAGNOSIS — Z9049 Acquired absence of other specified parts of digestive tract: Secondary | ICD-10-CM

## 2018-10-16 DIAGNOSIS — M542 Cervicalgia: Secondary | ICD-10-CM

## 2018-10-16 DIAGNOSIS — I110 Hypertensive heart disease with heart failure: Secondary | ICD-10-CM | POA: Diagnosis not present

## 2018-10-16 DIAGNOSIS — E1143 Type 2 diabetes mellitus with diabetic autonomic (poly)neuropathy: Secondary | ICD-10-CM | POA: Diagnosis not present

## 2018-10-16 DIAGNOSIS — Z7902 Long term (current) use of antithrombotics/antiplatelets: Secondary | ICD-10-CM

## 2018-10-16 DIAGNOSIS — I2510 Atherosclerotic heart disease of native coronary artery without angina pectoris: Secondary | ICD-10-CM | POA: Diagnosis not present

## 2018-10-16 DIAGNOSIS — G4733 Obstructive sleep apnea (adult) (pediatric): Secondary | ICD-10-CM | POA: Diagnosis not present

## 2018-10-16 DIAGNOSIS — E669 Obesity, unspecified: Secondary | ICD-10-CM | POA: Diagnosis not present

## 2018-10-16 DIAGNOSIS — Z9884 Bariatric surgery status: Secondary | ICD-10-CM

## 2018-10-16 DIAGNOSIS — Z9081 Acquired absence of spleen: Secondary | ICD-10-CM

## 2018-10-16 DIAGNOSIS — Z7982 Long term (current) use of aspirin: Secondary | ICD-10-CM

## 2018-10-16 DIAGNOSIS — K7581 Nonalcoholic steatohepatitis (NASH): Secondary | ICD-10-CM | POA: Diagnosis not present

## 2018-10-16 NOTE — Patient Instructions (Addendum)
You have been scheduled for an abdominal ultrasound at [facility] [phone] [place], [place]  [address]) on Tuesday 10/23/18  at 8:45 am. Please arrive 15 minutes prior to your appointment for registration. Make certain not to have anything to eat or drink 6 hours prior to your appointment. Should you need to reschedule your appointment, please contact radiology at [phone]. This test typically takes about 30 minutes to perform.  We have ordered labs including CBC, CMP, INR and AFP as above.  Since you prefer to have these completed at [facility], please call and find out when a good date/time would be for you to go for labs. Orders have already been placed in Epic.  Please follow a Low-sodium diet.  Continue Dexilant and famotidine 20 mg.  You may purchase famotidine over-the-counter through [email or web address] while it is on backorder at the pharmacy.   Decrease metoclopramide to 0.5-1 tablet (preferably 0.5 tablet) three times daily before meals and call if you have any recurrent upper abdominal pain, nausea, vomiting or significant bloating symptoms.  Please follow up in 1-month with Dr PHilarie Fredrickson sooner if needed.   Low-Sodium Eating Plan Sodium, which is an element that makes up salt, helps you maintain a healthy balance of fluids in your body. Too much sodium can increase your blood pressure and cause fluid and waste to be held in your body. Your health care provider or dietitian may recommend following this plan if you have high blood pressure (hypertension), kidney disease, liver disease, or heart failure. Eating less sodium can help lower your blood pressure, reduce swelling, and protect your heart, liver, and kidneys. What are tips for following this plan? General guidelines  Most people on this plan should limit their sodium intake to 1,500-2,000 mg (milligrams) of sodium each day. Reading food labels   The Nutrition Facts label  lists the amount of sodium in one serving of the food. If you eat more than one serving, you must multiply the listed amount of sodium by the number of servings.  Choose foods with less than 140 mg of sodium per serving.  Avoid foods with 300 mg of sodium or more per serving. Shopping  Look for lower-sodium products, often labeled as "low-sodium" or "no salt added."  Always check the sodium content even if foods are labeled as "unsalted" or "no salt added".  Buy fresh foods. ? Avoid canned foods and premade or frozen meals. ? Avoid canned, cured, or processed meats  Buy breads that have less than 80 mg of sodium per slice. Cooking  Eat more home-cooked food and less restaurant, buffet, and fast food.  Avoid adding salt when cooking. Use salt-free seasonings or herbs instead of table salt or sea salt. Check with your health care provider or pharmacist before using salt substitutes.  Cook with plant-based oils, such as canola, sunflower, or olive oil. Meal planning  When eating at a restaurant, ask that your food be prepared with less salt or no salt, if possible.  Avoid foods that contain MSG (monosodium glutamate). MSG is sometimes added to [place], bouillon, and some canned foods. What foods are recommended? The items listed may not be a complete list. Talk with your dietitian about what dietary choices are best for you. Grains Low-sodium cereals, including oats, puffed wheat and rice, and shredded wheat. Low-sodium crackers. Unsalted rice. Unsalted pasta. Low-sodium bread. Whole-grain breads and whole-grain pasta. Vegetables Fresh or frozen vegetables. "No salt added" canned vegetables. "No  salt added" tomato sauce and paste. Low-sodium or reduced-sodium tomato and vegetable juice. Fruits Fresh, frozen, or canned fruit. Fruit juice. Meats and other protein foods Fresh or frozen (no salt added) meat, poultry, seafood, and fish. Low-sodium canned tuna and salmon. Unsalted  nuts. Dried peas, beans, and lentils without added salt. Unsalted canned beans. Eggs. Unsalted nut butters. Dairy Milk. Soy milk. Cheese that is naturally low in sodium, such as ricotta cheese, fresh mozzarella, or Swiss cheese Low-sodium or reduced-sodium cheese. Cream cheese. Yogurt. Fats and oils Unsalted butter. Unsalted margarine with no trans fat. Vegetable oils such as canola or olive oils. Seasonings and other foods Fresh and dried herbs and spices. Salt-free seasonings. Low-sodium mustard and ketchup. Sodium-free salad dressing. Sodium-free light mayonnaise. Fresh or refrigerated horseradish. Lemon juice. Vinegar. Homemade, reduced-sodium, or low-sodium soups. Unsalted popcorn and pretzels. Low-salt or salt-free chips. What foods are not recommended? The items listed may not be a complete list. Talk with your dietitian about what dietary choices are best for you. Grains Instant hot cereals. Bread stuffing, pancake, and biscuit mixes. Croutons. Seasoned rice or pasta mixes. Noodle soup cups. Boxed or frozen macaroni and cheese. Regular salted crackers. Self-rising flour. Vegetables Sauerkraut, pickled vegetables, and relishes. Olives. [place] fries. Onion rings. Regular canned vegetables (not low-sodium or reduced-sodium). Regular canned tomato sauce and paste (not low-sodium or reduced-sodium). Regular tomato and vegetable juice (not low-sodium or reduced-sodium). Frozen vegetables in sauces. Meats and other protein foods Meat or fish that is salted, canned, smoked, spiced, or pickled. Bacon, ham, sausage, hotdogs, corned beef, chipped beef, packaged lunch meats, salt pork, jerky, pickled herring, anchovies, regular canned tuna, sardines, salted nuts. Dairy Processed cheese and cheese spreads. Cheese curds. Blue cheese. Feta cheese. String cheese. Regular cottage cheese. Buttermilk. Canned milk. Fats and oils Salted butter. Regular margarine. Ghee. Bacon fat. Seasonings and other  foods Onion salt, garlic salt, seasoned salt, table salt, and sea salt. Canned and packaged gravies. Worcestershire sauce. Tartar sauce. Barbecue sauce. Teriyaki sauce. Soy sauce, including reduced-sodium. Steak sauce. Fish sauce. Oyster sauce. Cocktail sauce. Horseradish that you find on the shelf. Regular ketchup and mustard. Meat flavorings and tenderizers. Bouillon cubes. Hot sauce and Tabasco sauce. Premade or packaged marinades. Premade or packaged taco seasonings. Relishes. Regular salad dressings. Salsa. Potato and tortilla chips. Corn chips and puffs. Salted popcorn and pretzels. Canned or dried soups. Pizza. Frozen entrees and pot pies. Summary  Eating less sodium can help lower your blood pressure, reduce swelling, and protect your heart, liver, and kidneys.  Most people on this plan should limit their sodium intake to 1,500-2,000 mg (milligrams) of sodium each day.  Canned, boxed, and frozen foods are high in sodium. Restaurant foods, fast foods, and pizza are also very high in sodium. You also get sodium by adding salt to food.  Try to cook at home, eat more fresh fruits and vegetables, and eat less fast food, canned, processed, or prepared foods. This information is not intended to replace advice given to you by your health care provider. Make sure you discuss any questions you have with your health care provider. Document Released: 07/30/2001 Document Revised: 01/20/2017 Document Reviewed: 02/01/2016 Elsevier Patient Education  2020 [employer].

## 2018-10-16 NOTE — Progress Notes (Addendum)
Subjective:    Patient ID: Chad [name], male    DOB: February 19, 1956, 63 y.o.   MRN: [id number]  This service was provided via telemedicine.  Doximity app with AV communication but due to slow bandwidth changed to telephone visit. The patient was located at home The provider was located in provider's GI office. The patient did consent to this telephone visit and is aware of possible charges through their insurance for this visit.   The persons participating in this telemedicine service were the patient, his wife and I. Time spent on call: 16 min   HPI Chad [name] is a 63 year old male with a past medical history of nonalcoholic fatty liver disease cirrhosis, GERD, gastroparesis related to diabetes, history of adenomatous polyps, diverticulosis, CAD status post PCI in November 2018 for unstable angina on Plavix, recurrent angina, congestive heart failure followed at [facility], hypertension, hyperlipidemia and diabetes who seen for follow-up.  He was last seen just over 3 years ago.  He is seen virtually today in the setting of COVID-19.  He reports that he has been having good days and bad days particularly as it relates to his chronic chest pain and shortness of breath.  He follows with [facility] cardiology for his heart care.  He last saw cardiology clinic in June 2020.  They continue to work on his diuretic therapy to stabilize his volume status.  He has been taking Bumex, metolazone and spironolactone.  He has continued to have frequent chest pain symptoms.  He did have a PET stress test on 01/24/2018 without ischemia or infarction.  He does use nitroglycerin several times a week most recently yesterday.  From a GI perspective he reports that his heartburn has been well controlled though he has had issues getting his famotidine due to shortages at the pharmacy.  This does help him particularly at night.  He is using Dexilant 60 mg in the morning.  He also has continued metoclopramide 10 mg before meals.  He  denies nausea and vomiting.  No abdominal pain.  Bowel movements are usually regular though he has not had a bowel movement today.  He usually goes 1 or 2 times each morning.  Denies blood in his stool or melena.  He has not had intense episodes of abdominal pain like he had several years ago.  From a liver perspective he denies jaundice.  No increasing abdominal girth though he does still with lower extremity edema.  His wife denies episodes of confusion or agitation.  Again no bleeding as above.  No itching.  He had an ultrasound which Dr. Danise Mina ordered in March, reviewed today.  He had an upper endoscopy and colonoscopy in July 2017.  At this EGD there were no varices.  There was evidence of prior probable gastric bypass surgical intervention in the stomach with an aberrant lumen which connected to the distal stomach.  There was mild gastritis on this day but without H. pylori.   Review of Systems As per HPI, otherwise negative  Current Medications, Allergies, Past Medical History, Past Surgical History, Family History and Social History were reviewed in [employer] record.      Objective:   Physical Exam Virtual visit, no physical exam today  CBC    Component Value Date/Time   WBC 5.4 05/17/2018 1530   RBC 5.03 05/17/2018 1530   HGB 12.8 (L) 05/17/2018 1530   HCT 40.7 05/17/2018 1530   PLT 160 05/17/2018 1530   MCV 80.9 05/17/2018 1530  MCV 88.0 08/04/2010   MCH 25.4 (L) 05/17/2018 1530   MCHC 31.4 05/17/2018 1530   RDW 14.1 05/17/2018 1530   LYMPHSABS 1.0 04/28/2017 1215   MONOABS 0.6 04/28/2017 1215   EOSABS 0.0 04/28/2017 1215   BASOSABS 0.0 04/28/2017 1215   CMP     Component Value Date/Time   NA 134 (L) 05/17/2018 1530   NA 139 08/04/2010   K 3.7 05/17/2018 1530   K 4.4 08/04/2010   CL 94 (L) 05/17/2018 1530   CO2 24 05/17/2018 1530   GLUCOSE 239 (H) 05/17/2018 1530   BUN 15 05/17/2018 1530   CREATININE 1.11 05/17/2018 1530    CREATININE 1.08 08/04/2010   CALCIUM 9.0 05/17/2018 1530   PROT 6.5 11/16/2017 0908   ALBUMIN 4.5 01/30/2018 1518   AST 42 (H) 11/16/2017 0908   AST 47 08/04/2010   ALT 38 11/16/2017 0908   ALT 28 08/03/2015 1205   ALKPHOS 163 (H) 11/16/2017 0908   BILITOT 1.2 11/16/2017 0908   GFRNONAA >60 05/17/2018 1530   GFRAA >60 05/17/2018 1530   Lab Results  Component Value Date   INR 1.05 12/27/2016   INR 1.05 06/30/2016   INR 1.1 (H) 06/08/2016   Care everywhere reviewed for labs.  In April 2020 his AST and ALT were elevated at 73 and 59, respectively, normal bilirubin at 1.0 and alkaline phosphatase at 193  Platelet counts have been normal at 150, hemoglobin and April was 13.2  ABDOMEN ULTRASOUND COMPLETE   COMPARISON:  12/21/2015 and prior studies again noted.   FINDINGS: Gallbladder: Not visualized compatible with cholecystectomy.   Common bile duct: Diameter: 8 mm. No intrahepatic or extrahepatic biliary dilatation.   Liver: Increased hepatic echogenicity noted. No focal hepatic lesions are identified. No gross morphologic changes of cirrhosis are identified on this study. Portal vein is patent on color Doppler imaging with normal direction of blood flow towards the liver.   IVC: Not well visualized   Pancreas: Visualized portion unremarkable.   Spleen: Splenomegaly noted with splenic volume of 940 cc   Right Kidney: Length: 13.8 cm. Echogenicity within normal limits. No mass or hydronephrosis visualized.   Left Kidney: Length: 14 cm. Echogenicity within normal limits. No mass or hydronephrosis visualized.   Abdominal aorta: No aneurysm visualized.   Other findings: None.   IMPRESSION: 1. Increased hepatic echogenicity/hepatic steatosis and splenomegaly again noted. No focal hepatic lesions. 2. No acute abnormality.     Electronically Signed   By: Margarette Canada M.D.   On: 05/04/2018 11:54      Assessment & Plan:  63 year old male with a past medical history  of nonalcoholic fatty liver disease cirrhosis, GERD, gastroparesis related to diabetes, history of adenomatous polyps, diverticulosis, CAD status post PCI in November 2018 for unstable angina on Plavix, recurrent angina, congestive heart failure followed at [facility], hypertension, hyperlipidemia and diabetes who seen for follow-up.   1. NAFLD cirrhosis --overall his cirrhosis has been well compensated and his biggest issue remains heart failure.  His bilirubin most recently has been normal though his AST and ALT have been up slightly related to fatty liver.  INR has not been checked and we need to update this.  His platelets have been normal which argues against significant portal hypertension though he does have splenomegaly.  We discussed how the main interventions against fatty liver cirrhosis is to control risk factors for him which is his obesity, hypertension, hyperlipidemia and diabetes. --Britt screening --does need repeat ultrasound in September 2020  along with checking an AFP --Variceal screening --due at this time though given his frequent angina and shortness of breath I have recommended we delay variceal screening at this time.  This would also require him to hold Plavix.  We discussed this today and he understands the rationale.  With his significant cardiac history and tenuous volume status upper endoscopy is felt more risky than beneficial currently. --Will need labs including CBC, CMP and INR, also AFP as above.  He would prefer to have these drawn at [place]  2.  GERD --well-controlled though has had trouble getting famotidine.  We will continue Dexilant and famotidine 20 mg.  I made he and his wife aware that they could get famotidine over-the-counter through [email or web address]  They feel that their children can help them order this medication directly given that it has been difficult to find at local pharmacies  3.  Gastroparesis --related to diabetes.  He has been on  metoclopramide.  Not sure if he still needs this.  We did discuss the long-term possible side effects of this medication and I would like him to try to come off of this medicine.  He will let me know if he has recurrent upper abdominal pain, nausea vomiting or significant bloating symptom. --Discontinue metoclopramide --Pt's wife called back stating that actually he does get upper abd pain, bloating, n and v when this med is not used; thus, will try and reduce dose as much as possible as tolerated by symptoms --Reglan 5-10 mg TIDACHS PRN  4.  History of adenomatous polyps --up-to-date with surveillance colonoscopy, repeat would not be needed until July 2024  5.  CAD/CHF --following closely with [facility] cardiology on Plavix as well as aspirin.  On 3 diuretics as well as antihypertensives.  Using nitroglycerin fairly frequently  6.  Diabetes --follows with primary care for this issue.  Discussed with him the importance of tight glucose control as much as he can achieve.  1-monthfollow-up, sooner if needed

## 2018-10-16 NOTE — Addendum Note (Signed)
Addended by: Larina Bras on: 10/16/2018 12:36 PM   Modules accepted: Orders

## 2018-10-16 NOTE — Progress Notes (Signed)
Neurosurgery referral placed.

## 2018-10-23 ENCOUNTER — Ambulatory Visit
Admission: RE | Admit: 2018-10-23 | Discharge: 2018-10-23 | Disposition: A | Discharge status: Home / Self Care | Payer: Medicare Other | Source: Ambulatory Visit | Attending: Internal Medicine | Admitting: Internal Medicine

## 2018-10-23 ENCOUNTER — Other Ambulatory Visit: Payer: Self-pay

## 2018-10-23 DIAGNOSIS — I110 Hypertensive heart disease with heart failure: Secondary | ICD-10-CM | POA: Diagnosis not present

## 2018-10-23 DIAGNOSIS — Z7902 Long term (current) use of antithrombotics/antiplatelets: Secondary | ICD-10-CM | POA: Diagnosis not present

## 2018-10-23 DIAGNOSIS — I5030 Unspecified diastolic (congestive) heart failure: Secondary | ICD-10-CM | POA: Diagnosis not present

## 2018-10-23 DIAGNOSIS — Z9861 Coronary angioplasty status: Secondary | ICD-10-CM | POA: Diagnosis not present

## 2018-10-23 DIAGNOSIS — E119 Type 2 diabetes mellitus without complications: Secondary | ICD-10-CM | POA: Diagnosis not present

## 2018-10-23 DIAGNOSIS — K7460 Unspecified cirrhosis of liver: Secondary | ICD-10-CM

## 2018-10-23 DIAGNOSIS — I2510 Atherosclerotic heart disease of native coronary artery without angina pectoris: Secondary | ICD-10-CM | POA: Diagnosis not present

## 2018-10-23 DIAGNOSIS — Z794 Long term (current) use of insulin: Secondary | ICD-10-CM | POA: Diagnosis not present

## 2018-10-23 DIAGNOSIS — K760 Fatty (change of) liver, not elsewhere classified: Secondary | ICD-10-CM | POA: Diagnosis not present

## 2018-10-23 DIAGNOSIS — K7689 Other specified diseases of liver: Secondary | ICD-10-CM | POA: Diagnosis not present

## 2018-10-23 DIAGNOSIS — Z7982 Long term (current) use of aspirin: Secondary | ICD-10-CM | POA: Diagnosis not present

## 2018-10-23 DIAGNOSIS — K766 Portal hypertension: Secondary | ICD-10-CM | POA: Diagnosis not present

## 2018-10-23 DIAGNOSIS — E785 Hyperlipidemia, unspecified: Secondary | ICD-10-CM | POA: Diagnosis not present

## 2018-10-23 DIAGNOSIS — Z79899 Other long term (current) drug therapy: Secondary | ICD-10-CM | POA: Diagnosis not present

## 2018-10-23 DIAGNOSIS — K219 Gastro-esophageal reflux disease without esophagitis: Secondary | ICD-10-CM | POA: Diagnosis not present

## 2018-10-23 DIAGNOSIS — F329 Major depressive disorder, single episode, unspecified: Secondary | ICD-10-CM | POA: Diagnosis not present

## 2018-10-24 ENCOUNTER — Other Ambulatory Visit: Payer: Self-pay | Admitting: Family Medicine

## 2018-10-24 DIAGNOSIS — I110 Hypertensive heart disease with heart failure: Secondary | ICD-10-CM | POA: Diagnosis not present

## 2018-10-24 DIAGNOSIS — E119 Type 2 diabetes mellitus without complications: Secondary | ICD-10-CM | POA: Diagnosis not present

## 2018-10-24 DIAGNOSIS — I2510 Atherosclerotic heart disease of native coronary artery without angina pectoris: Secondary | ICD-10-CM | POA: Diagnosis not present

## 2018-10-24 DIAGNOSIS — G4733 Obstructive sleep apnea (adult) (pediatric): Secondary | ICD-10-CM | POA: Diagnosis not present

## 2018-10-24 DIAGNOSIS — E669 Obesity, unspecified: Secondary | ICD-10-CM | POA: Diagnosis not present

## 2018-10-24 DIAGNOSIS — I5032 Chronic diastolic (congestive) heart failure: Secondary | ICD-10-CM | POA: Diagnosis not present

## 2018-10-26 ENCOUNTER — Other Ambulatory Visit (INDEPENDENT_AMBULATORY_CARE_PROVIDER_SITE_OTHER): Payer: Medicare Other

## 2018-10-26 DIAGNOSIS — I509 Heart failure, unspecified: Secondary | ICD-10-CM | POA: Diagnosis not present

## 2018-10-26 DIAGNOSIS — E1143 Type 2 diabetes mellitus with diabetic autonomic (poly)neuropathy: Secondary | ICD-10-CM | POA: Diagnosis not present

## 2018-10-26 DIAGNOSIS — K219 Gastro-esophageal reflux disease without esophagitis: Secondary | ICD-10-CM

## 2018-10-26 DIAGNOSIS — Z86010 Personal history of colonic polyps: Secondary | ICD-10-CM | POA: Diagnosis not present

## 2018-10-26 DIAGNOSIS — K7460 Unspecified cirrhosis of liver: Secondary | ICD-10-CM

## 2018-10-26 DIAGNOSIS — K766 Portal hypertension: Secondary | ICD-10-CM | POA: Diagnosis not present

## 2018-10-26 DIAGNOSIS — K760 Fatty (change of) liver, not elsewhere classified: Secondary | ICD-10-CM | POA: Diagnosis not present

## 2018-10-26 DIAGNOSIS — K3184 Gastroparesis: Secondary | ICD-10-CM | POA: Diagnosis not present

## 2018-10-26 DIAGNOSIS — I25119 Atherosclerotic heart disease of native coronary artery with unspecified angina pectoris: Secondary | ICD-10-CM

## 2018-10-30 ENCOUNTER — Other Ambulatory Visit: Payer: Self-pay | Admitting: Family Medicine

## 2018-10-30 DIAGNOSIS — H9193 Unspecified hearing loss, bilateral: Secondary | ICD-10-CM | POA: Diagnosis not present

## 2018-10-30 DIAGNOSIS — I110 Hypertensive heart disease with heart failure: Secondary | ICD-10-CM | POA: Diagnosis not present

## 2018-10-30 DIAGNOSIS — Z7902 Long term (current) use of antithrombotics/antiplatelets: Secondary | ICD-10-CM | POA: Diagnosis not present

## 2018-10-30 DIAGNOSIS — M542 Cervicalgia: Secondary | ICD-10-CM | POA: Diagnosis not present

## 2018-10-30 DIAGNOSIS — Z9884 Bariatric surgery status: Secondary | ICD-10-CM | POA: Diagnosis not present

## 2018-10-30 DIAGNOSIS — Z794 Long term (current) use of insulin: Secondary | ICD-10-CM | POA: Diagnosis not present

## 2018-10-30 DIAGNOSIS — Z7982 Long term (current) use of aspirin: Secondary | ICD-10-CM | POA: Diagnosis not present

## 2018-10-30 DIAGNOSIS — K7581 Nonalcoholic steatohepatitis (NASH): Secondary | ICD-10-CM | POA: Diagnosis not present

## 2018-10-30 DIAGNOSIS — M25512 Pain in left shoulder: Secondary | ICD-10-CM | POA: Diagnosis not present

## 2018-10-30 DIAGNOSIS — Z9049 Acquired absence of other specified parts of digestive tract: Secondary | ICD-10-CM | POA: Diagnosis not present

## 2018-10-30 DIAGNOSIS — E039 Hypothyroidism, unspecified: Secondary | ICD-10-CM | POA: Diagnosis not present

## 2018-10-30 DIAGNOSIS — E119 Type 2 diabetes mellitus without complications: Secondary | ICD-10-CM | POA: Diagnosis not present

## 2018-10-30 DIAGNOSIS — E669 Obesity, unspecified: Secondary | ICD-10-CM | POA: Diagnosis not present

## 2018-10-30 DIAGNOSIS — Z6841 Body Mass Index (BMI) 40.0 and over, adult: Secondary | ICD-10-CM | POA: Diagnosis not present

## 2018-10-30 DIAGNOSIS — I2510 Atherosclerotic heart disease of native coronary artery without angina pectoris: Secondary | ICD-10-CM | POA: Diagnosis not present

## 2018-10-30 DIAGNOSIS — K219 Gastro-esophageal reflux disease without esophagitis: Secondary | ICD-10-CM | POA: Diagnosis not present

## 2018-10-30 DIAGNOSIS — Z9081 Acquired absence of spleen: Secondary | ICD-10-CM | POA: Diagnosis not present

## 2018-10-30 DIAGNOSIS — E785 Hyperlipidemia, unspecified: Secondary | ICD-10-CM | POA: Diagnosis not present

## 2018-10-30 DIAGNOSIS — I5032 Chronic diastolic (congestive) heart failure: Secondary | ICD-10-CM | POA: Diagnosis not present

## 2018-10-30 DIAGNOSIS — G4733 Obstructive sleep apnea (adult) (pediatric): Secondary | ICD-10-CM | POA: Diagnosis not present

## 2018-10-30 DIAGNOSIS — M5412 Radiculopathy, cervical region: Secondary | ICD-10-CM | POA: Diagnosis not present

## 2018-10-31 LAB — COMPREHENSIVE METABOLIC PANEL
AG Ratio: 2.3 (calc) (ref 1.0–2.5)
ALT: 44 U/L (ref 9–46)
AST: 65 U/L — ABNORMAL HIGH (ref 10–35)
Albumin: 4.4 g/dL (ref 3.6–5.1)
Alkaline phosphatase (APISO): 153 U/L — ABNORMAL HIGH (ref 35–144)
BUN: 13 mg/dL (ref 7–25)
CO2: 23 mmol/L (ref 20–32)
Calcium: 9.6 mg/dL (ref 8.6–10.3)
Chloride: 100 mmol/L (ref 98–110)
Creat: 1.11 mg/dL (ref 0.70–1.25)
Globulin: 1.9 g/dL (calc) (ref 1.9–3.7)
Glucose, Bld: 98 mg/dL (ref 65–99)
Potassium: 4.3 mmol/L (ref 3.5–5.3)
Sodium: 137 mmol/L (ref 135–146)
Total Bilirubin: 1.5 mg/dL — ABNORMAL HIGH (ref 0.2–1.2)
Total Protein: 6.3 g/dL (ref 6.1–8.1)

## 2018-10-31 LAB — CBC WITH DIFFERENTIAL/PLATELET
Absolute Monocytes: 455 cells/uL (ref 200–950)
Basophils Absolute: 18 cells/uL (ref 0–200)
Basophils Relative: 0.4 %
Eosinophils Absolute: 60 cells/uL (ref 15–500)
Eosinophils Relative: 1.3 %
HCT: 41.2 % (ref 38.5–50.0)
Hemoglobin: 12.8 g/dL — ABNORMAL LOW (ref 13.2–17.1)
Lymphs Abs: 713 cells/uL — ABNORMAL LOW (ref 850–3900)
MCH: 24 pg — ABNORMAL LOW (ref 27.0–33.0)
MCHC: 31.1 g/dL — ABNORMAL LOW (ref 32.0–36.0)
MCV: 77.2 fL — ABNORMAL LOW (ref 80.0–100.0)
MPV: 11.8 fL (ref 7.5–12.5)
Monocytes Relative: 9.9 %
Neutro Abs: 3353 cells/uL (ref 1500–7800)
Neutrophils Relative %: 72.9 %
Platelets: 159 10*3/uL (ref 140–400)
RBC: 5.34 10*6/uL (ref 4.20–5.80)
RDW: 15.1 % — ABNORMAL HIGH (ref 11.0–15.0)
Total Lymphocyte: 15.5 %
WBC: 4.6 10*3/uL (ref 3.8–10.8)

## 2018-10-31 LAB — PROTIME-INR
INR: 1.1
Prothrombin Time: 11.7 s — ABNORMAL HIGH (ref 9.0–11.5)

## 2018-10-31 LAB — AFP TUMOR MARKER: AFP-Tumor Marker: 4.8 ng/mL (ref <6.1)

## 2018-11-05 ENCOUNTER — Other Ambulatory Visit: Payer: Self-pay | Admitting: Student

## 2018-11-05 DIAGNOSIS — M5412 Radiculopathy, cervical region: Secondary | ICD-10-CM

## 2018-11-06 DIAGNOSIS — Z961 Presence of intraocular lens: Secondary | ICD-10-CM | POA: Diagnosis not present

## 2018-11-06 DIAGNOSIS — H02831 Dermatochalasis of right upper eyelid: Secondary | ICD-10-CM | POA: Diagnosis not present

## 2018-11-06 DIAGNOSIS — H25011 Cortical age-related cataract, right eye: Secondary | ICD-10-CM | POA: Diagnosis not present

## 2018-11-06 DIAGNOSIS — H2511 Age-related nuclear cataract, right eye: Secondary | ICD-10-CM | POA: Diagnosis not present

## 2018-11-06 DIAGNOSIS — H25041 Posterior subcapsular polar age-related cataract, right eye: Secondary | ICD-10-CM | POA: Diagnosis not present

## 2018-11-06 DIAGNOSIS — H18413 Arcus senilis, bilateral: Secondary | ICD-10-CM | POA: Diagnosis not present

## 2018-11-06 DIAGNOSIS — E119 Type 2 diabetes mellitus without complications: Secondary | ICD-10-CM | POA: Diagnosis not present

## 2018-11-07 DIAGNOSIS — G4733 Obstructive sleep apnea (adult) (pediatric): Secondary | ICD-10-CM | POA: Diagnosis not present

## 2018-11-07 DIAGNOSIS — I2510 Atherosclerotic heart disease of native coronary artery without angina pectoris: Secondary | ICD-10-CM | POA: Diagnosis not present

## 2018-11-07 DIAGNOSIS — E669 Obesity, unspecified: Secondary | ICD-10-CM | POA: Diagnosis not present

## 2018-11-07 DIAGNOSIS — I110 Hypertensive heart disease with heart failure: Secondary | ICD-10-CM | POA: Diagnosis not present

## 2018-11-07 DIAGNOSIS — I5032 Chronic diastolic (congestive) heart failure: Secondary | ICD-10-CM | POA: Diagnosis not present

## 2018-11-07 DIAGNOSIS — E119 Type 2 diabetes mellitus without complications: Secondary | ICD-10-CM | POA: Diagnosis not present

## 2018-11-09 ENCOUNTER — Telehealth: Payer: Self-pay

## 2018-11-09 DIAGNOSIS — I5030 Unspecified diastolic (congestive) heart failure: Secondary | ICD-10-CM | POA: Diagnosis not present

## 2018-11-09 MED ORDER — FLUCONAZOLE 150 MG PO TABS: 150.0000 mg | ORAL_TABLET | Freq: Once | ORAL | 0 refills | Status: AC

## 2018-11-09 NOTE — Telephone Encounter (Signed)
Pt notified as instructed and will cb for appt if needed. Pt appreciative.

## 2018-11-09 NOTE — Telephone Encounter (Signed)
Will refill diflucan for him - sent to pharmacy. Schedule OV if recurrent.

## 2018-11-09 NOTE — Telephone Encounter (Signed)
Mrs Orourke left v/m that pt is having the yeast infection to come back again. Pt has been using the lotrimen cream but it does not seem to be helping a lot this time and Mrs Horsch hopes abx(diflucan?0 could be called in rather than having to come in for appt. Mrs Kirsh request cb.

## 2018-11-12 DIAGNOSIS — H2511 Age-related nuclear cataract, right eye: Secondary | ICD-10-CM | POA: Diagnosis not present

## 2018-11-12 DIAGNOSIS — Z961 Presence of intraocular lens: Secondary | ICD-10-CM | POA: Diagnosis not present

## 2018-11-12 DIAGNOSIS — I5030 Unspecified diastolic (congestive) heart failure: Secondary | ICD-10-CM | POA: Diagnosis not present

## 2018-11-14 ENCOUNTER — Other Ambulatory Visit: Payer: Self-pay

## 2018-11-14 ENCOUNTER — Ambulatory Visit
Admission: RE | Admit: 2018-11-14 | Discharge: 2018-11-14 | Disposition: A | Discharge status: Home / Self Care | Payer: Medicare Other | Source: Ambulatory Visit | Attending: Student | Admitting: Student

## 2018-11-14 DIAGNOSIS — M5412 Radiculopathy, cervical region: Secondary | ICD-10-CM | POA: Diagnosis not present

## 2018-11-14 DIAGNOSIS — S199XXA Unspecified injury of neck, initial encounter: Secondary | ICD-10-CM | POA: Diagnosis not present

## 2018-11-15 DIAGNOSIS — E669 Obesity, unspecified: Secondary | ICD-10-CM | POA: Diagnosis not present

## 2018-11-15 DIAGNOSIS — I5032 Chronic diastolic (congestive) heart failure: Secondary | ICD-10-CM | POA: Diagnosis not present

## 2018-11-15 DIAGNOSIS — I110 Hypertensive heart disease with heart failure: Secondary | ICD-10-CM | POA: Diagnosis not present

## 2018-11-15 DIAGNOSIS — G4733 Obstructive sleep apnea (adult) (pediatric): Secondary | ICD-10-CM | POA: Diagnosis not present

## 2018-11-15 DIAGNOSIS — E119 Type 2 diabetes mellitus without complications: Secondary | ICD-10-CM | POA: Diagnosis not present

## 2018-11-15 DIAGNOSIS — I2510 Atherosclerotic heart disease of native coronary artery without angina pectoris: Secondary | ICD-10-CM | POA: Diagnosis not present

## 2018-11-20 DIAGNOSIS — M4802 Spinal stenosis, cervical region: Secondary | ICD-10-CM | POA: Diagnosis not present

## 2018-11-21 ENCOUNTER — Ambulatory Visit (INDEPENDENT_AMBULATORY_CARE_PROVIDER_SITE_OTHER): Payer: Medicare Other

## 2018-11-21 ENCOUNTER — Other Ambulatory Visit: Payer: Self-pay | Admitting: Neurosurgery

## 2018-11-21 ENCOUNTER — Other Ambulatory Visit: Payer: Self-pay | Admitting: Family Medicine

## 2018-11-21 VITALS — Wt 314.0 lb

## 2018-11-21 DIAGNOSIS — IMO0002 Reserved for concepts with insufficient information to code with codable children: Secondary | ICD-10-CM

## 2018-11-21 DIAGNOSIS — M4802 Spinal stenosis, cervical region: Secondary | ICD-10-CM

## 2018-11-21 DIAGNOSIS — E782 Mixed hyperlipidemia: Secondary | ICD-10-CM

## 2018-11-21 DIAGNOSIS — E11319 Type 2 diabetes mellitus with unspecified diabetic retinopathy without macular edema: Secondary | ICD-10-CM

## 2018-11-21 DIAGNOSIS — E669 Obesity, unspecified: Secondary | ICD-10-CM | POA: Diagnosis not present

## 2018-11-21 DIAGNOSIS — Z0000 Encounter for general adult medical examination without abnormal findings: Secondary | ICD-10-CM

## 2018-11-21 DIAGNOSIS — E119 Type 2 diabetes mellitus without complications: Secondary | ICD-10-CM | POA: Diagnosis not present

## 2018-11-21 DIAGNOSIS — G4733 Obstructive sleep apnea (adult) (pediatric): Secondary | ICD-10-CM | POA: Diagnosis not present

## 2018-11-21 DIAGNOSIS — K7460 Unspecified cirrhosis of liver: Secondary | ICD-10-CM

## 2018-11-21 DIAGNOSIS — Z125 Encounter for screening for malignant neoplasm of prostate: Secondary | ICD-10-CM

## 2018-11-21 DIAGNOSIS — I2510 Atherosclerotic heart disease of native coronary artery without angina pectoris: Secondary | ICD-10-CM | POA: Diagnosis not present

## 2018-11-21 DIAGNOSIS — K7581 Nonalcoholic steatohepatitis (NASH): Secondary | ICD-10-CM

## 2018-11-21 DIAGNOSIS — I5032 Chronic diastolic (congestive) heart failure: Secondary | ICD-10-CM | POA: Diagnosis not present

## 2018-11-21 DIAGNOSIS — E039 Hypothyroidism, unspecified: Secondary | ICD-10-CM

## 2018-11-21 DIAGNOSIS — I110 Hypertensive heart disease with heart failure: Secondary | ICD-10-CM | POA: Diagnosis not present

## 2018-11-21 NOTE — Progress Notes (Signed)
PCP notes: none  Health Maintenance: Patient declined Shingrix    Abnormal Screenings: none    Patient concerns: none    Nurse concerns: none    Next PCP appt.: 11/26/2018 @ 9:30 am

## 2018-11-21 NOTE — Patient Instructions (Signed)
Chad [name] , Thank you for taking time to come for your Medicare Wellness Visit. I appreciate your ongoing commitment to your health goals. Please review the following plan we discussed and let me know if I can assist you in the future.   Screening recommendations/referrals: Colonoscopy: up to date, completed 08/31/2015 Recommended yearly ophthalmology/optometry visit for glaucoma screening and checkup Recommended yearly dental visit for hygiene and checkup  Vaccinations: Influenza vaccine: up to date, completed 10/15/2018 Pneumococcal vaccine: up to date Tdap vaccine: up to date, completed 01/31/2011 Shingles vaccine: declined    Advanced directives: Advance directive discussed with you today. Even though you declined this today please call our office should you change your mind and we can give you the proper paperwork for you to fill out.   Conditions/risks identified: diabetes, hypertension  Next appointment: 11/26/2018 @ 9:30 am   Preventive Care 40-64 Years, Male Preventive care refers to lifestyle choices and visits with your health care provider that can promote health and wellness. What does preventive care include?  A yearly physical exam. This is also called an annual well check.  Dental exams once or twice a year.  Routine eye exams. Ask your health care provider how often you should have your eyes checked.  Personal lifestyle choices, including:  Daily care of your teeth and gums.  Regular physical activity.  Eating a healthy diet.  Avoiding tobacco and drug use.  Limiting alcohol use.  Practicing safe sex.  Taking low-dose aspirin every day starting at age 37. What happens during an annual well check? The services and screenings done by your health care provider during your annual well check will depend on your age, overall health, lifestyle risk factors, and family history of disease. Counseling  Your health care provider may ask you questions about your:   Alcohol use.  Tobacco use.  Drug use.  Emotional well-being.  Home and relationship well-being.  Sexual activity.  Eating habits.  Work and work Statistician. Screening  You may have the following tests or measurements:  Height, weight, and BMI.  Blood pressure.  Lipid and cholesterol levels. These may be checked every 5 years, or more frequently if you are over 51 years old.  Skin check.  Lung cancer screening. You may have this screening every year starting at age 25 if you have a 30-pack-year history of smoking and currently smoke or have quit within the past 15 years.  Fecal occult blood test (FOBT) of the stool. You may have this test every year starting at age 39.  Flexible sigmoidoscopy or colonoscopy. You may have a sigmoidoscopy every 5 years or a colonoscopy every 10 years starting at age 66.  Prostate cancer screening. Recommendations will vary depending on your family history and other risks.  Hepatitis C blood test.  Hepatitis B blood test.  Sexually transmitted disease (STD) testing.  Diabetes screening. This is done by checking your blood sugar (glucose) after you have not eaten for a while (fasting). You may have this done every 1-3 years. Discuss your test results, treatment options, and if necessary, the need for more tests with your health care provider. Vaccines  Your health care provider may recommend certain vaccines, such as:  Influenza vaccine. This is recommended every year.  Tetanus, diphtheria, and acellular pertussis (Tdap, Td) vaccine. You may need a Td booster every 10 years.  Zoster vaccine. You may need this after age 4.  Pneumococcal 13-valent conjugate (PCV13) vaccine. You may need this if you have certain  conditions and have not been vaccinated.  Pneumococcal polysaccharide (PPSV23) vaccine. You may need one or two doses if you smoke cigarettes or if you have certain conditions. Talk to your health care provider about which  screenings and vaccines you need and how often you need them. This information is not intended to replace advice given to you by your health care provider. Make sure you discuss any questions you have with your health care provider. Document Released: 03/06/2015 Document Revised: 10/28/2015 Document Reviewed: 12/09/2014 Elsevier Interactive Patient Education  2017 [employer] Prevention in the Home Falls can cause injuries. They can happen to people of all ages. There are many things you can do to make your home safe and to help prevent falls. What can I do on the outside of my home?  Regularly fix the edges of walkways and driveways and fix any cracks.  Remove anything that might make you trip as you walk through a door, such as a raised step or threshold.  Trim any bushes or trees on the path to your home.  Use bright outdoor lighting.  Clear any walking paths of anything that might make someone trip, such as rocks or tools.  Regularly check to see if handrails are loose or broken. Make sure that both sides of any steps have handrails.  Any raised decks and porches should have guardrails on the edges.  Have any leaves, snow, or ice cleared regularly.  Use sand or salt on walking paths during winter.  Clean up any spills in your garage right away. This includes oil or grease spills. What can I do in the bathroom?  Use night lights.  Install grab bars by the toilet and in the tub and shower. Do not use towel bars as grab bars.  Use non-skid mats or decals in the tub or shower.  If you need to sit down in the shower, use a plastic, non-slip stool.  Keep the floor dry. Clean up any water that spills on the floor as soon as it happens.  Remove soap buildup in the tub or shower regularly.  Attach bath mats securely with double-sided non-slip rug tape.  Do not have throw rugs and other things on the floor that can make you trip. What can I do in the bedroom?  Use  night lights.  Make sure that you have a light by your bed that is easy to reach.  Do not use any sheets or blankets that are too big for your bed. They should not hang down onto the floor.  Have a firm chair that has side arms. You can use this for support while you get dressed.  Do not have throw rugs and other things on the floor that can make you trip. What can I do in the kitchen?  Clean up any spills right away.  Avoid walking on wet floors.  Keep items that you use a lot in easy-to-reach places.  If you need to reach something above you, use a strong step stool that has a grab bar.  Keep electrical cords out of the way.  Do not use floor polish or wax that makes floors slippery. If you must use wax, use non-skid floor wax.  Do not have throw rugs and other things on the floor that can make you trip. What can I do with my stairs?  Do not leave any items on the stairs.  Make sure that there are handrails on both sides of the  stairs and use them. Fix handrails that are broken or loose. Make sure that handrails are as long as the stairways.  Check any carpeting to make sure that it is firmly attached to the stairs. Fix any carpet that is loose or worn.  Avoid having throw rugs at the top or bottom of the stairs. If you do have throw rugs, attach them to the floor with carpet tape.  Make sure that you have a light switch at the top of the stairs and the bottom of the stairs. If you do not have them, ask someone to add them for you. What else can I do to help prevent falls?  Wear shoes that:  Do not have high heels.  Have rubber bottoms.  Are comfortable and fit you well.  Are closed at the toe. Do not wear sandals.  If you use a stepladder:  Make sure that it is fully opened. Do not climb a closed stepladder.  Make sure that both sides of the stepladder are locked into place.  Ask someone to hold it for you, if possible.  Clearly mark and make sure that you  can see:  Any grab bars or handrails.  First and last steps.  Where the edge of each step is.  Use tools that help you move around (mobility aids) if they are needed. These include:  Canes.  Walkers.  Scooters.  Crutches.  Turn on the lights when you go into a dark area. Replace any light bulbs as soon as they burn out.  Set up your furniture so you have a clear path. Avoid moving your furniture around.  If any of your floors are uneven, fix them.  If there are any pets around you, be aware of where they are.  Review your medicines with your doctor. Some medicines can make you feel dizzy. This can increase your chance of falling. Ask your doctor what other things that you can do to help prevent falls. This information is not intended to replace advice given to you by your health care provider. Make sure you discuss any questions you have with your health care provider. Document Released: 12/04/2008 Document Revised: 07/16/2015 Document Reviewed: 03/14/2014 Elsevier Interactive Patient Education  2017 [employer].
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
## 2018-11-22 ENCOUNTER — Other Ambulatory Visit: Payer: Self-pay

## 2018-11-22 ENCOUNTER — Ambulatory Visit: Payer: Medicare Other

## 2018-11-22 ENCOUNTER — Other Ambulatory Visit (INDEPENDENT_AMBULATORY_CARE_PROVIDER_SITE_OTHER): Payer: Medicare Other

## 2018-11-22 DIAGNOSIS — E1165 Type 2 diabetes mellitus with hyperglycemia: Secondary | ICD-10-CM | POA: Diagnosis not present

## 2018-11-22 DIAGNOSIS — Z125 Encounter for screening for malignant neoplasm of prostate: Secondary | ICD-10-CM | POA: Diagnosis not present

## 2018-11-22 DIAGNOSIS — K7460 Unspecified cirrhosis of liver: Secondary | ICD-10-CM | POA: Diagnosis not present

## 2018-11-22 DIAGNOSIS — E039 Hypothyroidism, unspecified: Secondary | ICD-10-CM

## 2018-11-22 DIAGNOSIS — I5030 Unspecified diastolic (congestive) heart failure: Secondary | ICD-10-CM | POA: Diagnosis not present

## 2018-11-22 DIAGNOSIS — K7581 Nonalcoholic steatohepatitis (NASH): Secondary | ICD-10-CM | POA: Diagnosis not present

## 2018-11-22 DIAGNOSIS — E782 Mixed hyperlipidemia: Secondary | ICD-10-CM | POA: Diagnosis not present

## 2018-11-22 DIAGNOSIS — Z4509 Encounter for adjustment and management of other cardiac device: Secondary | ICD-10-CM | POA: Diagnosis not present

## 2018-11-22 DIAGNOSIS — E11319 Type 2 diabetes mellitus with unspecified diabetic retinopathy without macular edema: Secondary | ICD-10-CM | POA: Diagnosis not present

## 2018-11-22 DIAGNOSIS — IMO0002 Reserved for concepts with insufficient information to code with codable children: Secondary | ICD-10-CM

## 2018-11-22 LAB — CBC WITH DIFFERENTIAL/PLATELET
Basophils Absolute: 0 10*3/uL (ref 0.0–0.1)
Basophils Relative: 0.8 % (ref 0.0–3.0)
Eosinophils Absolute: 0.1 10*3/uL (ref 0.0–0.7)
Eosinophils Relative: 2 % (ref 0.0–5.0)
HCT: 39.3 % (ref 39.0–52.0)
Hemoglobin: 12.7 g/dL — ABNORMAL LOW (ref 13.0–17.0)
Lymphocytes Relative: 13.8 % (ref 12.0–46.0)
Lymphs Abs: 0.6 10*3/uL — ABNORMAL LOW (ref 0.7–4.0)
MCHC: 32.3 g/dL (ref 30.0–36.0)
MCV: 75.9 fl — ABNORMAL LOW (ref 78.0–100.0)
Monocytes Absolute: 0.4 10*3/uL (ref 0.1–1.0)
Monocytes Relative: 9.9 % (ref 3.0–12.0)
Neutro Abs: 3.2 10*3/uL (ref 1.4–7.7)
Neutrophils Relative %: 73.5 % (ref 43.0–77.0)
Platelets: 122 10*3/uL — ABNORMAL LOW (ref 150.0–400.0)
RBC: 5.17 Mil/uL (ref 4.22–5.81)
RDW: 17.1 % — ABNORMAL HIGH (ref 11.5–15.5)
WBC: 4.3 10*3/uL (ref 4.0–10.5)

## 2018-11-22 LAB — LIPID PANEL
Cholesterol: 99 mg/dL (ref 0–200)
HDL: 37.7 mg/dL — ABNORMAL LOW (ref >39.00)
LDL Cholesterol: 38 mg/dL (ref 0–99)
NonHDL: 61.21
Total CHOL/HDL Ratio: 3
Triglycerides: 117 mg/dL (ref 0.0–149.0)
VLDL: 23.4 mg/dL (ref 0.0–40.0)

## 2018-11-22 LAB — COMPREHENSIVE METABOLIC PANEL
ALT: 40 U/L (ref 0–53)
AST: 43 U/L — ABNORMAL HIGH (ref 0–37)
Albumin: 4.2 g/dL (ref 3.5–5.2)
Alkaline Phosphatase: 156 U/L — ABNORMAL HIGH (ref 39–117)
BUN: 14 mg/dL (ref 6–23)
CO2: 26 mEq/L (ref 19–32)
Calcium: 9.3 mg/dL (ref 8.4–10.5)
Chloride: 101 mEq/L (ref 96–112)
Creatinine, Ser: 0.94 mg/dL (ref 0.40–1.50)
GFR: 80.86 mL/min (ref >60.00)
Glucose, Bld: 142 mg/dL — ABNORMAL HIGH (ref 70–99)
Potassium: 4 mEq/L (ref 3.5–5.1)
Sodium: 137 mEq/L (ref 135–145)
Total Bilirubin: 1.1 mg/dL (ref 0.2–1.2)
Total Protein: 6.2 g/dL (ref 6.0–8.3)

## 2018-11-22 LAB — PROTIME-INR
INR: 1.2 ratio — ABNORMAL HIGH (ref 0.8–1.0)
Prothrombin Time: 13.9 s — ABNORMAL HIGH (ref 9.6–13.1)

## 2018-11-22 LAB — TSH: TSH: 0.9 u[IU]/mL (ref 0.35–4.50)

## 2018-11-22 LAB — PSA, MEDICARE: PSA: 0.84 ng/ml (ref 0.10–4.00)

## 2018-11-22 LAB — HEMOGLOBIN A1C: Hgb A1c MFr Bld: 7 % — ABNORMAL HIGH (ref 4.6–6.5)

## 2018-11-26 ENCOUNTER — Other Ambulatory Visit: Payer: Self-pay

## 2018-11-26 ENCOUNTER — Ambulatory Visit (INDEPENDENT_AMBULATORY_CARE_PROVIDER_SITE_OTHER): Payer: Medicare Other | Admitting: Family Medicine

## 2018-11-26 ENCOUNTER — Encounter: Payer: Self-pay | Admitting: Family Medicine

## 2018-11-26 VITALS — BP 136/60 | HR 71 | Temp 97.6°F | Ht 70.5 in | Wt 324.0 lb

## 2018-11-26 DIAGNOSIS — K219 Gastro-esophageal reflux disease without esophagitis: Secondary | ICD-10-CM | POA: Diagnosis not present

## 2018-11-26 DIAGNOSIS — E1143 Type 2 diabetes mellitus with diabetic autonomic (poly)neuropathy: Secondary | ICD-10-CM

## 2018-11-26 DIAGNOSIS — Z7189 Other specified counseling: Secondary | ICD-10-CM

## 2018-11-26 DIAGNOSIS — I10 Essential (primary) hypertension: Secondary | ICD-10-CM | POA: Diagnosis not present

## 2018-11-26 DIAGNOSIS — E1169 Type 2 diabetes mellitus with other specified complication: Secondary | ICD-10-CM

## 2018-11-26 DIAGNOSIS — H34812 Central retinal vein occlusion, left eye, with macular edema: Secondary | ICD-10-CM | POA: Diagnosis not present

## 2018-11-26 DIAGNOSIS — F418 Other specified anxiety disorders: Secondary | ICD-10-CM

## 2018-11-26 DIAGNOSIS — G4733 Obstructive sleep apnea (adult) (pediatric): Secondary | ICD-10-CM | POA: Diagnosis not present

## 2018-11-26 DIAGNOSIS — E1165 Type 2 diabetes mellitus with hyperglycemia: Secondary | ICD-10-CM

## 2018-11-26 DIAGNOSIS — E11319 Type 2 diabetes mellitus with unspecified diabetic retinopathy without macular edema: Secondary | ICD-10-CM

## 2018-11-26 DIAGNOSIS — Z9884 Bariatric surgery status: Secondary | ICD-10-CM | POA: Diagnosis not present

## 2018-11-26 DIAGNOSIS — IMO0002 Reserved for concepts with insufficient information to code with codable children: Secondary | ICD-10-CM

## 2018-11-26 DIAGNOSIS — G959 Disease of spinal cord, unspecified: Secondary | ICD-10-CM | POA: Diagnosis not present

## 2018-11-26 DIAGNOSIS — I2510 Atherosclerotic heart disease of native coronary artery without angina pectoris: Secondary | ICD-10-CM | POA: Diagnosis not present

## 2018-11-26 DIAGNOSIS — I110 Hypertensive heart disease with heart failure: Secondary | ICD-10-CM | POA: Diagnosis not present

## 2018-11-26 DIAGNOSIS — E1142 Type 2 diabetes mellitus with diabetic polyneuropathy: Secondary | ICD-10-CM

## 2018-11-26 DIAGNOSIS — K7460 Unspecified cirrhosis of liver: Secondary | ICD-10-CM

## 2018-11-26 DIAGNOSIS — E119 Type 2 diabetes mellitus without complications: Secondary | ICD-10-CM | POA: Diagnosis not present

## 2018-11-26 DIAGNOSIS — I5032 Chronic diastolic (congestive) heart failure: Secondary | ICD-10-CM | POA: Diagnosis not present

## 2018-11-26 DIAGNOSIS — E039 Hypothyroidism, unspecified: Secondary | ICD-10-CM

## 2018-11-26 DIAGNOSIS — E785 Hyperlipidemia, unspecified: Secondary | ICD-10-CM

## 2018-11-26 DIAGNOSIS — K7581 Nonalcoholic steatohepatitis (NASH): Secondary | ICD-10-CM | POA: Diagnosis not present

## 2018-11-26 DIAGNOSIS — K3184 Gastroparesis: Secondary | ICD-10-CM

## 2018-11-26 DIAGNOSIS — E669 Obesity, unspecified: Secondary | ICD-10-CM | POA: Diagnosis not present

## 2018-11-26 MED ORDER — OZEMPIC (0.25 OR 0.5 MG/DOSE) 2 MG/1.5ML ~~LOC~~ SOPN: 0.5000 mg | PEN_INJECTOR | SUBCUTANEOUS | 6 refills | Status: DC

## 2018-11-26 NOTE — Assessment & Plan Note (Addendum)
Appreciate care of cards and CardioMEMS program.

## 2018-11-26 NOTE — Assessment & Plan Note (Signed)
Chronic, stable. Continue current regimen. 

## 2018-11-26 NOTE — Assessment & Plan Note (Addendum)
Chronic, improved glycemic control on farxiga and ozempic along with other diabetes regimen - continue.  Sees eye doctor regularly. H/o central retinal vein occlusion with residual vision loss.

## 2018-11-26 NOTE — Assessment & Plan Note (Signed)
Chronic, stable. Continue levothyroxine 175mg daily.

## 2018-11-26 NOTE — Progress Notes (Addendum)
This visit was conducted in person.  BP 136/60 (BP Location: Right Arm, Patient Position: Sitting, Cuff Size: Large)   Pulse 71   Temp 97.6 F (36.4 C) (Temporal)   Ht 5' 10.5" (1.791 m)   Wt (!) 324 lb (147 kg)   SpO2 96%   BMI 45.83 kg/m    CC: AMW f/u visit Subjective:    Patient ID: Chad [name], male    DOB: 1955-04-19, 63 y.o.   MRN: [id number]  HPI: Chad [name] is a 63 y.o. male presenting on 11/26/2018 for Annual Exam (Prt 2. )   Saw health advisor last week for medicare wellness visit. Note reviewed.   No exam data present    Clinical Support from 11/21/2018 in [place] at [facility] Total Score  0      Fall Risk  11/21/2018 11/16/2017 05/15/2017 12/19/2016 11/08/2016  Falls in the past year? 1 Yes No No No  Comment - "passed out" in yard - - -  Number falls in past yr: 1 1 - - -  Injury with Fall? 1 No - - -  Comment neck injury - - - -  Risk for fall due to : History of fall(s);Impaired balance/gait;Impaired mobility;Medication side effect - - - -  Risk for fall due to: Comment - - - - -  Follow up Falls evaluation completed;Falls prevention discussed - - - -    Saw cardiology last month - ziopatch placed.  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  DM - doing well on ozempic.   Preventative: COLONOSCOPY Date: 12/2014 TAs, mod diverticulosis, rpt 3 yrs (Pyrtle) COLONOSCOPY 08/2015 - HP, diverticulosis (Pyrtle) Prostatecancerscreen previously yearly - will defer DRE at this time due to other comorbidities.  Flu shot yearly  Td- 01/2011  Pneumovax2008, 2013  Shingrix-discussed  Discussed advanced directives, doesn't think would want prolonged life support, but will think about this.Would want wife to be HCPOA. Packetpreviouslyprovided.would want ventilation support only if he gets coronavirus infection.   Seat belt use discussed Sunscreen use discussed. Just saw dermatology.  Non smoker Alcohol - none Dentist - last seen a few years ago Eye exam yearly - had cataract surgery. L vision loss from burst blood vessel due to hypertension  Bowel - no constipation  Bladder - some urine leaking. S/p 2 recent episodes of balanitis.   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 Stays away from fried foods.     Relevant past medical, surgical, family and social history reviewed and updated as indicated. Interim medical history since our last visit reviewed. Allergies and medications reviewed and updated. Outpatient Medications Prior to Visit  Medication Sig Dispense Refill  . albuterol (PROVENTIL HFA;VENTOLIN HFA) 108 (90 Base) MCG/ACT inhaler INHALE 2 PUFFS INTO THE LUNGS EVERY 6 HOURS AS NEEDED FOR WHEEZING ORSHORTNESS OF BREATH 18 g 6  . amLODipine (NORVASC) 5 MG tablet Take 1 tablet (5 mg total) by mouth daily.    Marland Kitchen aspirin 81 MG chewable tablet Chew 81 mg by mouth daily.     Marland Kitchen atorvastatin (LIPITOR) 20 MG tablet Take 1 tablet (20 mg total) by mouth daily.    . bumetanide (BUMEX) 2  MG tablet Take 1 tablet (2 mg total) by mouth 2 (two) times daily as needed (weight gain/leg swelling).    . citalopram (CELEXA) 20 MG tablet TAKE 1 TABLET BY MOUTH ONCE DAILY 90 tablet 0  . clopidogrel (PLAVIX) 75 MG tablet Take 1 tablet (75 mg total) by mouth daily.    . clotrimazole (LOTRIMIN) 1 % cream Apply 1 application topically 2 (two) times daily. 60 g 0  . clotrimazole-betamethasone (LOTRISONE) cream Apply 1 application topically 2 (two) times daily. 30 g 0  . dapagliflozin propanediol (FARXIGA) 5 MG TABS tablet Take 5 mg by mouth daily before breakfast.    . DEXILANT 60 MG capsule TAKE 1 CAPSULE BY MOUTH ONCE DAILY (Patient  taking differently: Take 60 mg by mouth daily. ) 30 capsule 5  . diazepam (VALIUM) 5 MG tablet Take 1 tablet (5 mg total) by mouth every 12 (twelve) hours as needed for anxiety. 30 tablet 1  . diclofenac sodium (VOLTAREN) 1 % GEL Apply 1 application topically 3 (three) times daily. 1 Tube 1  . Docusate Sodium (DSS) 100 MG CAPS 1 tab 2 times a day while on narcotics.  STOOL SOFTENER 60 each 0  . famotidine (PEPCID) 20 MG tablet TAKE 1 TABLET BY MOUTH EVERY NIGHT AT BEDTIME 90 tablet 1  . fluticasone (FLONASE) 50 MCG/ACT nasal spray PLACE 2 SPRAYS INTO BOTH NOSTRILS DAILY 16 g 2  . Insulin Pen Needle (ULTICARE MINI PEN NEEDLES) 31G X 6 MM MISC USE AS DIRECTED FOR LANTUS SOLOSTAR PEN 100 each 3  . isosorbide mononitrate (IMDUR) 60 MG 24 hr tablet TAKE 3 TABS BY MOUTH ONCE DAILY 90 tablet 5  . LANTUS SOLOSTAR 100 UNIT/ML Solostar Pen INJECT 50 UNITS INTO THE SKIN TWICE DAILY 30 mL 5  . levothyroxine (SYNTHROID) 125 MCG tablet TAKE 1 TABLET BY MOUTH ONCE A DAY 30 tablet 1  . lisinopril (PRINIVIL,ZESTRIL) 20 MG tablet Take 1 tablet (20 mg total) by mouth daily. 90 tablet 3  . magnesium oxide (MAG-OX) 400 MG tablet Take 1 tablet by mouth 2 (two) times daily.    . metFORMIN (GLUCOPHAGE) 1000 MG tablet TAKE 1 TABLET BY MOUTH TWICE A DAY WITH A MEAL 180 tablet 1  . metoCLOPramide (REGLAN) 10 MG tablet Take 0.5-1 tablets (5-10 mg total) by mouth 3 (three) times daily before meals. 1 tablet 0  . metolazone (ZAROXOLYN) 2.5 MG tablet Take 1 tablet (2.5 mg total) by mouth daily as needed (for more than 3lb/day or 5lb/wk weight gain).    . metoprolol succinate (TOPROL-XL) 50 MG 24 hr tablet TAKE 1 TABLET BY MOUTH ONCE DAILY 90 tablet 1  . Multiple Vitamin (MULITIVITAMIN WITH MINERALS) TABS Take 1 tablet by mouth daily.    . nitroGLYCERIN (NITROSTAT) 0.4 MG SL tablet Place 1 tablet (0.4 mg total) under the tongue every 5 (five) minutes as needed for chest pain. 60 tablet 3  . polyethylene glycol (MIRALAX /  GLYCOLAX) packet 17grams in 16 oz of water twice a day until bowel movement.  LAXITIVE.  Restart if two days since last bowel movement (Patient taking differently: 17grams in 16 oz of water once daily  LAXITIVE.  Restart if two days since last bowel movement) 14 each 0  . potassium chloride (K-DUR,KLOR-CON) 10 MEQ tablet Take 10 mEq by mouth 2 (two) times daily.    Marland Kitchen spironolactone (ALDACTONE) 25 MG tablet Take 1 tablet by mouth daily.    . sucralfate (CARAFATE) 1 g tablet TAKE 1 TABLET BY  MOUTH TWICE (2) DAILY BEFORE A MEAL 60 tablet 1  . vitamin E 400 UNIT capsule Take 400 Units by mouth daily.    Marland Kitchen LORazepam (ATIVAN) 0.5 MG tablet Take 0.5-1 tablets (0.25-0.5 mg total) by mouth 2 (two) times daily as needed for anxiety. 30 tablet 1   No facility-administered medications prior to visit.      Per HPI unless specifically indicated in ROS section below Review of Systems Objective:    BP 136/60 (BP Location: Right Arm, Patient Position: Sitting, Cuff Size: Large)   Pulse 71   Temp 97.6 F (36.4 C) (Temporal)   Ht 5' 10.5" (1.791 m)   Wt (!) 324 lb (147 kg)   SpO2 96%   BMI 45.83 kg/m   Wt Readings from Last 3 Encounters:  11/26/18 (!) 324 lb (147 kg)  11/21/18 (!) 314 lb (142.4 kg)  10/16/18 (!) 328 lb (148.8 kg)    Physical Exam Vitals signs and nursing note reviewed.  Constitutional:      General: He is not in acute distress.    Appearance: Normal appearance. He is well-developed. He is obese.     Comments: Ambulates with rollator  HENT:     Head: Normocephalic and atraumatic.     Right Ear: Hearing, tympanic membrane, ear canal and external ear normal.     Left Ear: Hearing, tympanic membrane, ear canal and external ear normal.     Nose: Nose normal.     Mouth/Throat:     Mouth: Mucous membranes are moist.     Pharynx: Oropharynx is clear. Uvula midline. No posterior oropharyngeal erythema.  Eyes:     General: No scleral icterus.    Extraocular Movements: Extraocular  movements intact.     Conjunctiva/sclera: Conjunctivae normal.     Pupils: Pupils are equal, round, and reactive to light.     Comments: S/p cataract extraction bilaterally  Neck:     Musculoskeletal: Normal range of motion and neck supple.  Cardiovascular:     Rate and Rhythm: Normal rate and regular rhythm.     Pulses: Normal pulses.          Radial pulses are 2+ on the right side and 2+ on the left side.     Heart sounds: Normal heart sounds. No murmur.  Pulmonary:     Effort: Pulmonary effort is normal. No respiratory distress.     Breath sounds: Normal breath sounds. No wheezing, rhonchi or rales.  Abdominal:     General: Abdomen is flat. Bowel sounds are normal. There is no distension.     Palpations: Abdomen is soft. There is no mass.     Tenderness: There is no abdominal tenderness. There is no guarding or rebound.     Hernia: No hernia is present.  Musculoskeletal: Normal range of motion.  Lymphadenopathy:     Cervical: No cervical adenopathy.  Skin:    General: Skin is warm and dry.     Findings: No rash.  Neurological:     General: No focal deficit present.     Mental Status: He is alert and oriented to person, place, and time.     Comments: CN grossly intact, station and gait intact  Psychiatric:        Mood and Affect: Mood normal.        Behavior: Behavior normal.        Thought Content: Thought content normal.        Judgment: Judgment normal.  Results for orders placed or performed in visit on 11/22/18  Protime-INR  Result Value Ref Range   INR 1.2 (H) 0.8 - 1.0 ratio   Prothrombin Time 13.9 (H) 9.6 - 13.1 sec  PSA, Medicare  Result Value Ref Range   PSA 0.84 0.10 - 4.00 ng/ml  CBC with Differential/Platelet  Result Value Ref Range   WBC 4.3 4.0 - 10.5 K/uL   RBC 5.17 4.22 - 5.81 Mil/uL   Hemoglobin 12.7 (L) 13.0 - 17.0 g/dL   HCT 39.3 39.0 - 52.0 %   MCV 75.9 (L) 78.0 - 100.0 fl   MCHC 32.3 30.0 - 36.0 g/dL   RDW 17.1 (H) 11.5 - 15.5 %    Platelets 122.0 (L) 150.0 - 400.0 K/uL   Neutrophils Relative % 73.5 43.0 - 77.0 %   Lymphocytes Relative 13.8 12.0 - 46.0 %   Monocytes Relative 9.9 3.0 - 12.0 %   Eosinophils Relative 2.0 0.0 - 5.0 %   Basophils Relative 0.8 0.0 - 3.0 %   Neutro Abs 3.2 1.4 - 7.7 K/uL   Lymphs Abs 0.6 (L) 0.7 - 4.0 K/uL   Monocytes Absolute 0.4 0.1 - 1.0 K/uL   Eosinophils Absolute 0.1 0.0 - 0.7 K/uL   Basophils Absolute 0.0 0.0 - 0.1 K/uL  Hemoglobin A1c  Result Value Ref Range   Hgb A1c MFr Bld 7.0 (H) 4.6 - 6.5 %  TSH  Result Value Ref Range   TSH 0.90 0.35 - 4.50 uIU/mL  Comprehensive metabolic panel  Result Value Ref Range   Sodium 137 135 - 145 mEq/L   Potassium 4.0 3.5 - 5.1 mEq/L   Chloride 101 96 - 112 mEq/L   CO2 26 19 - 32 mEq/L   Glucose, Bld 142 (H) 70 - 99 mg/dL   BUN 14 6 - 23 mg/dL   Creatinine, Ser 0.94 0.40 - 1.50 mg/dL   Total Bilirubin 1.1 0.2 - 1.2 mg/dL   Alkaline Phosphatase 156 (H) 39 - 117 U/L   AST 43 (H) 0 - 37 U/L   ALT 40 0 - 53 U/L   Total Protein 6.2 6.0 - 8.3 g/dL   Albumin 4.2 3.5 - 5.2 g/dL   Calcium 9.3 8.4 - 10.5 mg/dL   GFR 80.86 >60.00 mL/min  Lipid panel  Result Value Ref Range   Cholesterol 99 0 - 200 mg/dL   Triglycerides 117.0 0.0 - 149.0 mg/dL   HDL 37.70 (L) >39.00 mg/dL   VLDL 23.4 0.0 - 40.0 mg/dL   LDL Cholesterol 38 0 - 99 mg/dL   Total CHOL/HDL Ratio 3    NonHDL 61.21    Assessment & Plan:   Problem List Items Addressed This Visit    Type 2 diabetes mellitus, uncontrolled, with retinopathy (HCC)    Chronic, improved glycemic control on farxiga and ozempic along with other diabetes regimen - continue.  Sees eye doctor regularly. H/o central retinal vein occlusion with residual vision loss.       Relevant Medications   Semaglutide,0.25 or 0.5MG/DOS, (OZEMPIC, 0.25 OR 0.5 MG/DOSE,) 2 MG/1.5ML SOPN   Situational anxiety    Stable period on celexa 2m daily - continue this along with valium PRN.       Obesity, Class III, BMI  40-49.9 (morbid obesity) (HCC) (Chronic)    Weight gain noted - planning to take second torsemide dose this afternoon.       Relevant Medications   Semaglutide,0.25 or 0.5MG/DOS, (OZEMPIC, 0.25 OR 0.5 MG/DOSE,) 2 MG/1.5ML  SOPN   Liver cirrhosis secondary to NASH ([place]) (Chronic)    Noted low platelets today as well as mild transaminitis. Continue to monitor.  MELD-Na score: 9 at 11/22/2018  8:46 AM MELD score: 9 at 11/22/2018  8:46 AM Calculated from: Serum Creatinine: 0.94 mg/dL (Rounded to 1 mg/dL) at 11/22/2018  8:46 AM Serum Sodium: 137 mEq/L at 11/22/2018  8:46 AM Total Bilirubin: 1.1 mg/dL at 11/22/2018  8:46 AM INR(ratio): 1.2 ratio at 11/22/2018  8:46 AM Age: 89 years 8 months       Hypothyroidism    Chronic, stable. Continue levothyroxine 132mg daily.       Hyperlipidemia associated with type 2 diabetes mellitus (HCC)    Chronic, stable - continue lipitor. The ASCVD Risk score (Mikey BussingDC Jr., et al., 2013) failed to calculate for the following reasons:   The valid total cholesterol range is 130 to 320 mg/dL       Relevant Medications   Semaglutide,0.25 or 0.5MG/DOS, (OZEMPIC, 0.25 OR 0.5 MG/DOSE,) 2 MG/1.5ML SOPN   HTN (hypertension) (Chronic)    Chronic, stable. Continue current regimen.       GERD (gastroesophageal reflux disease)    Severe - on dexilant 638mdaily, reglan, pepcid, carafate PRN.       Diabetic neuropathy associated with type 2 diabetes mellitus (HCC)    Stable period. Check foot exam next visit.       Relevant Medications   Semaglutide,0.25 or 0.5MG/DOS, (OZEMPIC, 0.25 OR 0.5 MG/DOSE,) 2 MG/1.5ML SOPN   Diabetic gastroparesis (HCC)    Stable period - continues reglan TID. Monitor effect of weekly GLP1 RA.       Relevant Medications   Semaglutide,0.25 or 0.5MG/DOS, (OZEMPIC, 0.25 OR 0.5 MG/DOSE,) 2 MG/1.5ML SOPN   Coronary artery disease, non-occlusive (Chronic)   Chronic diastolic CHF (congestive heart failure) ([place]   Appreciate care of cards  and CardioMEMS program.       Cervical myelopathy ([place]   Has established with [facility] - pending anesthesia preop and cardiac clearance for upcoming ACDF.       Central retinal vein occlusion with macular edema of left eye    Residual L eye vision loss.       Bariatric surgery status   Advanced care planning/counseling discussion - Primary    Discussed advanced directives, doesn't think would want prolonged life support, but will think about this.Would want wife to be HCPOA. Packetpreviouslyprovided.would want ventilation support only if he gets coronavirus infection.           Meds ordered this encounter  Medications  . Semaglutide,0.25 or 0.5MG/DOS, (OZEMPIC, 0.25 OR 0.5 MG/DOSE,) 2 MG/1.5ML SOPN    Sig: Inject 0.5 mg into the skin once a week.    Dispense:  1 pen    Refill:  6   No orders of the defined types were placed in this encounter.  Patient instructions: If interested, check with pharmacy about new 2 shot shingles series (shingrix).  Bring us[place]opy of your living will.  You are doing well today Continue current medicines.  Sugars are doing better! Return in 4 months for diabetes follow up.  Follow up plan: Return in about 4 months (around 03/29/2019) for follow up visit.  JaRia BushMD

## 2018-11-26 NOTE — Assessment & Plan Note (Addendum)
Stable period on celexa 81m daily - continue this along with valium PRN.

## 2018-11-26 NOTE — Assessment & Plan Note (Signed)
Residual L eye vision loss.

## 2018-11-26 NOTE — Assessment & Plan Note (Addendum)
Chronic, stable - continue lipitor. The ASCVD Risk score Chad Bussing DC Jr., et al., 2013) failed to calculate for the following reasons:   The valid total cholesterol range is 130 to 320 mg/dL

## 2018-11-26 NOTE — Assessment & Plan Note (Signed)
Severe - on dexilant 13m daily, reglan, pepcid, carafate PRN.

## 2018-11-26 NOTE — Assessment & Plan Note (Signed)
Discussed advanced directives, doesn't think would want prolonged life support, but will think about this.Would want wife to be HCPOA. Packetpreviouslyprovided.would want ventilation support only if he gets coronavirus infection.

## 2018-11-26 NOTE — Assessment & Plan Note (Signed)
Has established with [facility] - pending anesthesia preop and cardiac clearance for upcoming ACDF.

## 2018-11-26 NOTE — Assessment & Plan Note (Signed)
Stable period - continues reglan TID. Monitor effect of weekly GLP1 RA.

## 2018-11-26 NOTE — Assessment & Plan Note (Signed)
Noted low platelets today as well as mild transaminitis. Continue to monitor.  MELD-Na score: 9 at 11/22/2018  8:46 AM MELD score: 9 at 11/22/2018  8:46 AM Calculated from: Serum Creatinine: 0.94 mg/dL (Rounded to 1 mg/dL) at 11/22/2018  8:46 AM Serum Sodium: 137 mEq/L at 11/22/2018  8:46 AM Total Bilirubin: 1.1 mg/dL at 11/22/2018  8:46 AM INR(ratio): 1.2 ratio at 11/22/2018  8:46 AM Age: 63 years 8 months

## 2018-11-26 NOTE — Assessment & Plan Note (Signed)
Stable period. Check foot exam next visit.

## 2018-11-26 NOTE — Assessment & Plan Note (Signed)
Weight gain noted - planning to take second torsemide dose this afternoon.

## 2018-11-26 NOTE — Patient Instructions (Addendum)
If interested, check with pharmacy about new 2 shot shingles series (shingrix).  Bring [place] copy of your living will.  You are doing well today Continue current medicines.  Sugars are doing better! Return in 4 months for diabetes follow up.  Health Maintenance, Male Adopting a healthy lifestyle and getting preventive care are important in promoting health and wellness. Ask your health care provider about:  The right schedule for you to have regular tests and exams.  Things you can do on your own to prevent diseases and keep yourself healthy. What should I know about diet, weight, and exercise? Eat a healthy diet   Eat a diet that includes plenty of vegetables, fruits, low-fat dairy products, and lean protein.  Do not eat a lot of foods that are high in solid fats, added sugars, or sodium. Maintain a healthy weight Body mass index (BMI) is a measurement that can be used to identify possible weight problems. It estimates body fat based on height and weight. Your health care provider can help determine your BMI and help you achieve or maintain a healthy weight. Get regular exercise Get regular exercise. This is one of the most important things you can do for your health. Most adults should:  Exercise for at least 150 minutes each week. The exercise should increase your heart rate and make you sweat (moderate-intensity exercise).  Do strengthening exercises at least twice a week. This is in addition to the moderate-intensity exercise.  Spend less time sitting. Even light physical activity can be beneficial. Watch cholesterol and blood lipids Have your blood tested for lipids and cholesterol at 63 years of age, then have this test every 5 years. You may need to have your cholesterol levels checked more often if:  Your lipid or cholesterol levels are high.  You are older than 63 years of age.  You are at high risk for heart disease. What should I know about cancer screening? Many  types of cancers can be detected early and may often be prevented. Depending on your health history and family history, you may need to have cancer screening at various ages. This may include screening for:  Colorectal cancer.  Prostate cancer.  Skin cancer.  Lung cancer. What should I know about heart disease, diabetes, and high blood pressure? Blood pressure and heart disease  High blood pressure causes heart disease and increases the risk of stroke. This is more likely to develop in people who have high blood pressure readings, are of African descent, or are overweight.  Talk with your health care provider about your target blood pressure readings.  Have your blood pressure checked: ? Every 3-5 years if you are 72-61 years of age. ? Every year if you are 74 years old or older.  If you are between the ages of 31 and 15 and are a current or former smoker, ask your health care provider if you should have a one-time screening for abdominal aortic aneurysm (AAA). Diabetes Have regular diabetes screenings. This checks your fasting blood sugar level. Have the screening done:  Once every three years after age 30 if you are at a normal weight and have a low risk for diabetes.  More often and at a younger age if you are overweight or have a high risk for diabetes. What should I know about preventing infection? Hepatitis B If you have a higher risk for hepatitis B, you should be screened for this virus. Talk with your health care provider to find out  if you are at risk for hepatitis B infection. Hepatitis C Blood testing is recommended for:  Everyone born from [birth year] through 1965.  Anyone with known risk factors for hepatitis C. Sexually transmitted infections (STIs)  You should be screened each year for STIs, including gonorrhea and chlamydia, if: ? You are sexually active and are younger than 63 years of age. ? You are older than 63 years of age and your health care provider tells you  that you are at risk for this type of infection. ? Your sexual activity has changed since you were last screened, and you are at increased risk for chlamydia or gonorrhea. Ask your health care provider if you are at risk.  Ask your health care provider about whether you are at high risk for HIV. Your health care provider may recommend a prescription medicine to help prevent HIV infection. If you choose to take medicine to prevent HIV, you should first get tested for HIV. You should then be tested every 3 months for as long as you are taking the medicine. Follow these instructions at home: Lifestyle  Do not use any products that contain nicotine or tobacco, such as cigarettes, e-cigarettes, and chewing tobacco. If you need help quitting, ask your health care provider.  Do not use street drugs.  Do not share needles.  Ask your health care provider for help if you need support or information about quitting drugs. Alcohol use  Do not drink alcohol if your health care provider tells you not to drink.  If you drink alcohol: ? Limit how much you have to 0-2 drinks a day. ? Be aware of how much alcohol is in your drink. In the [place] one drink equals one 12 oz bottle of beer (355 mL), one 5 oz glass of wine (148 mL), or one 1 oz glass of hard liquor (44 mL). General instructions  Schedule regular health, dental, and eye exams.  Stay current with your vaccines.  Tell your health care provider if: ? You often feel depressed. ? You have ever been abused or do not feel safe at home. Summary  Adopting a healthy lifestyle and getting preventive care are important in promoting health and wellness.  Follow your health care provider's instructions about healthy diet, exercising, and getting tested or screened for diseases.  Follow your health care provider's instructions on monitoring your cholesterol and blood pressure. This information is not intended to replace advice given to you by your  health care provider. Make sure you discuss any questions you have with your health care provider. Document Released: 08/06/2007 Document Revised: 01/31/2018 Document Reviewed: 01/31/2018 Elsevier Patient Education  2020 [employer].

## 2018-11-27 DIAGNOSIS — I5032 Chronic diastolic (congestive) heart failure: Secondary | ICD-10-CM | POA: Diagnosis not present

## 2018-11-27 DIAGNOSIS — K219 Gastro-esophageal reflux disease without esophagitis: Secondary | ICD-10-CM | POA: Diagnosis not present

## 2018-11-27 DIAGNOSIS — E11319 Type 2 diabetes mellitus with unspecified diabetic retinopathy without macular edema: Secondary | ICD-10-CM | POA: Diagnosis not present

## 2018-11-27 DIAGNOSIS — Z9884 Bariatric surgery status: Secondary | ICD-10-CM | POA: Diagnosis not present

## 2018-11-27 DIAGNOSIS — R0600 Dyspnea, unspecified: Secondary | ICD-10-CM | POA: Diagnosis not present

## 2018-11-27 DIAGNOSIS — I440 Atrioventricular block, first degree: Secondary | ICD-10-CM | POA: Diagnosis not present

## 2018-11-27 DIAGNOSIS — E1165 Type 2 diabetes mellitus with hyperglycemia: Secondary | ICD-10-CM | POA: Diagnosis not present

## 2018-11-27 DIAGNOSIS — Z01818 Encounter for other preprocedural examination: Secondary | ICD-10-CM | POA: Diagnosis not present

## 2018-11-27 DIAGNOSIS — M79603 Pain in arm, unspecified: Secondary | ICD-10-CM | POA: Diagnosis not present

## 2018-11-27 DIAGNOSIS — D696 Thrombocytopenia, unspecified: Secondary | ICD-10-CM | POA: Diagnosis not present

## 2018-11-27 DIAGNOSIS — I2510 Atherosclerotic heart disease of native coronary artery without angina pectoris: Secondary | ICD-10-CM | POA: Diagnosis not present

## 2018-11-27 DIAGNOSIS — K7581 Nonalcoholic steatohepatitis (NASH): Secondary | ICD-10-CM | POA: Diagnosis not present

## 2018-11-27 DIAGNOSIS — I499 Cardiac arrhythmia, unspecified: Secondary | ICD-10-CM | POA: Diagnosis not present

## 2018-11-27 DIAGNOSIS — R001 Bradycardia, unspecified: Secondary | ICD-10-CM | POA: Diagnosis not present

## 2018-11-27 DIAGNOSIS — I200 Unstable angina: Secondary | ICD-10-CM | POA: Diagnosis not present

## 2018-11-27 DIAGNOSIS — G4733 Obstructive sleep apnea (adult) (pediatric): Secondary | ICD-10-CM | POA: Diagnosis not present

## 2018-11-27 DIAGNOSIS — M4802 Spinal stenosis, cervical region: Secondary | ICD-10-CM | POA: Diagnosis not present

## 2018-11-27 DIAGNOSIS — R079 Chest pain, unspecified: Secondary | ICD-10-CM | POA: Diagnosis not present

## 2018-11-27 DIAGNOSIS — I6529 Occlusion and stenosis of unspecified carotid artery: Secondary | ICD-10-CM | POA: Diagnosis not present

## 2018-11-27 DIAGNOSIS — R161 Splenomegaly, not elsewhere classified: Secondary | ICD-10-CM | POA: Diagnosis not present

## 2018-11-27 DIAGNOSIS — E039 Hypothyroidism, unspecified: Secondary | ICD-10-CM | POA: Diagnosis not present

## 2018-11-27 DIAGNOSIS — D1779 Benign lipomatous neoplasm of other sites: Secondary | ICD-10-CM | POA: Diagnosis not present

## 2018-11-27 DIAGNOSIS — I10 Essential (primary) hypertension: Secondary | ICD-10-CM | POA: Diagnosis not present

## 2018-11-27 DIAGNOSIS — K7460 Unspecified cirrhosis of liver: Secondary | ICD-10-CM | POA: Diagnosis not present

## 2018-11-28 ENCOUNTER — Other Ambulatory Visit: Payer: Self-pay | Admitting: Family Medicine

## 2018-11-29 ENCOUNTER — Other Ambulatory Visit: Payer: Self-pay

## 2018-11-29 ENCOUNTER — Ambulatory Visit
Admission: RE | Admit: 2018-11-29 | Discharge: 2018-11-29 | Disposition: A | Discharge status: Home / Self Care | Payer: Medicare Other | Source: Ambulatory Visit | Attending: Neurosurgery | Admitting: Neurosurgery

## 2018-11-29 DIAGNOSIS — M47812 Spondylosis without myelopathy or radiculopathy, cervical region: Secondary | ICD-10-CM | POA: Diagnosis not present

## 2018-11-29 DIAGNOSIS — E119 Type 2 diabetes mellitus without complications: Secondary | ICD-10-CM | POA: Diagnosis not present

## 2018-11-29 DIAGNOSIS — M4802 Spinal stenosis, cervical region: Secondary | ICD-10-CM | POA: Diagnosis not present

## 2018-11-29 DIAGNOSIS — E039 Hypothyroidism, unspecified: Secondary | ICD-10-CM | POA: Diagnosis not present

## 2018-11-29 DIAGNOSIS — E785 Hyperlipidemia, unspecified: Secondary | ICD-10-CM | POA: Diagnosis not present

## 2018-11-29 DIAGNOSIS — Z9049 Acquired absence of other specified parts of digestive tract: Secondary | ICD-10-CM | POA: Diagnosis not present

## 2018-11-29 DIAGNOSIS — G4733 Obstructive sleep apnea (adult) (pediatric): Secondary | ICD-10-CM | POA: Diagnosis not present

## 2018-11-29 DIAGNOSIS — E041 Nontoxic single thyroid nodule: Secondary | ICD-10-CM | POA: Diagnosis not present

## 2018-11-29 DIAGNOSIS — K219 Gastro-esophageal reflux disease without esophagitis: Secondary | ICD-10-CM | POA: Diagnosis not present

## 2018-11-29 DIAGNOSIS — K7581 Nonalcoholic steatohepatitis (NASH): Secondary | ICD-10-CM | POA: Diagnosis not present

## 2018-11-29 DIAGNOSIS — M2578 Osteophyte, vertebrae: Secondary | ICD-10-CM | POA: Diagnosis not present

## 2018-11-29 DIAGNOSIS — Z6841 Body Mass Index (BMI) 40.0 and over, adult: Secondary | ICD-10-CM | POA: Diagnosis not present

## 2018-11-29 DIAGNOSIS — I110 Hypertensive heart disease with heart failure: Secondary | ICD-10-CM | POA: Diagnosis not present

## 2018-11-29 DIAGNOSIS — I5032 Chronic diastolic (congestive) heart failure: Secondary | ICD-10-CM | POA: Diagnosis not present

## 2018-11-29 DIAGNOSIS — H9193 Unspecified hearing loss, bilateral: Secondary | ICD-10-CM | POA: Diagnosis not present

## 2018-11-29 DIAGNOSIS — Z794 Long term (current) use of insulin: Secondary | ICD-10-CM | POA: Diagnosis not present

## 2018-11-29 DIAGNOSIS — I2510 Atherosclerotic heart disease of native coronary artery without angina pectoris: Secondary | ICD-10-CM | POA: Diagnosis not present

## 2018-11-29 DIAGNOSIS — E669 Obesity, unspecified: Secondary | ICD-10-CM | POA: Diagnosis not present

## 2018-12-03 DIAGNOSIS — R490 Dysphonia: Secondary | ICD-10-CM | POA: Diagnosis not present

## 2018-12-03 DIAGNOSIS — J398 Other specified diseases of upper respiratory tract: Secondary | ICD-10-CM | POA: Diagnosis not present

## 2018-12-03 DIAGNOSIS — J3801 Paralysis of vocal cords and larynx, unilateral: Secondary | ICD-10-CM | POA: Diagnosis not present

## 2018-12-03 DIAGNOSIS — K219 Gastro-esophageal reflux disease without esophagitis: Secondary | ICD-10-CM | POA: Diagnosis not present

## 2018-12-04 DIAGNOSIS — I2510 Atherosclerotic heart disease of native coronary artery without angina pectoris: Secondary | ICD-10-CM | POA: Diagnosis not present

## 2018-12-04 DIAGNOSIS — I5032 Chronic diastolic (congestive) heart failure: Secondary | ICD-10-CM | POA: Diagnosis not present

## 2018-12-04 DIAGNOSIS — I110 Hypertensive heart disease with heart failure: Secondary | ICD-10-CM | POA: Diagnosis not present

## 2018-12-04 DIAGNOSIS — E119 Type 2 diabetes mellitus without complications: Secondary | ICD-10-CM | POA: Diagnosis not present

## 2018-12-04 DIAGNOSIS — E785 Hyperlipidemia, unspecified: Secondary | ICD-10-CM | POA: Diagnosis not present

## 2018-12-04 DIAGNOSIS — G4733 Obstructive sleep apnea (adult) (pediatric): Secondary | ICD-10-CM | POA: Diagnosis not present

## 2018-12-06 DIAGNOSIS — I5032 Chronic diastolic (congestive) heart failure: Secondary | ICD-10-CM | POA: Diagnosis not present

## 2018-12-06 DIAGNOSIS — I110 Hypertensive heart disease with heart failure: Secondary | ICD-10-CM | POA: Diagnosis not present

## 2018-12-06 DIAGNOSIS — E119 Type 2 diabetes mellitus without complications: Secondary | ICD-10-CM | POA: Diagnosis not present

## 2018-12-06 DIAGNOSIS — E785 Hyperlipidemia, unspecified: Secondary | ICD-10-CM | POA: Diagnosis not present

## 2018-12-06 DIAGNOSIS — G4733 Obstructive sleep apnea (adult) (pediatric): Secondary | ICD-10-CM | POA: Diagnosis not present

## 2018-12-06 DIAGNOSIS — I2510 Atherosclerotic heart disease of native coronary artery without angina pectoris: Secondary | ICD-10-CM | POA: Diagnosis not present

## 2018-12-09 DIAGNOSIS — K7581 Nonalcoholic steatohepatitis (NASH): Secondary | ICD-10-CM | POA: Diagnosis not present

## 2018-12-09 DIAGNOSIS — I5032 Chronic diastolic (congestive) heart failure: Secondary | ICD-10-CM | POA: Diagnosis not present

## 2018-12-09 DIAGNOSIS — E669 Obesity, unspecified: Secondary | ICD-10-CM | POA: Diagnosis not present

## 2018-12-09 DIAGNOSIS — G4733 Obstructive sleep apnea (adult) (pediatric): Secondary | ICD-10-CM | POA: Diagnosis not present

## 2018-12-09 DIAGNOSIS — K219 Gastro-esophageal reflux disease without esophagitis: Secondary | ICD-10-CM | POA: Diagnosis not present

## 2018-12-09 DIAGNOSIS — E039 Hypothyroidism, unspecified: Secondary | ICD-10-CM | POA: Diagnosis not present

## 2018-12-09 DIAGNOSIS — I2510 Atherosclerotic heart disease of native coronary artery without angina pectoris: Secondary | ICD-10-CM | POA: Diagnosis not present

## 2018-12-09 DIAGNOSIS — H9193 Unspecified hearing loss, bilateral: Secondary | ICD-10-CM | POA: Diagnosis not present

## 2018-12-09 DIAGNOSIS — Z9049 Acquired absence of other specified parts of digestive tract: Secondary | ICD-10-CM

## 2018-12-09 DIAGNOSIS — E119 Type 2 diabetes mellitus without complications: Secondary | ICD-10-CM | POA: Diagnosis not present

## 2018-12-09 DIAGNOSIS — E785 Hyperlipidemia, unspecified: Secondary | ICD-10-CM | POA: Diagnosis not present

## 2018-12-09 DIAGNOSIS — I110 Hypertensive heart disease with heart failure: Secondary | ICD-10-CM | POA: Diagnosis not present

## 2018-12-09 DIAGNOSIS — Z794 Long term (current) use of insulin: Secondary | ICD-10-CM

## 2018-12-09 DIAGNOSIS — Z6841 Body Mass Index (BMI) 40.0 and over, adult: Secondary | ICD-10-CM | POA: Diagnosis not present

## 2018-12-10 DIAGNOSIS — I110 Hypertensive heart disease with heart failure: Secondary | ICD-10-CM | POA: Diagnosis not present

## 2018-12-10 DIAGNOSIS — E785 Hyperlipidemia, unspecified: Secondary | ICD-10-CM | POA: Diagnosis not present

## 2018-12-10 DIAGNOSIS — E119 Type 2 diabetes mellitus without complications: Secondary | ICD-10-CM | POA: Diagnosis not present

## 2018-12-10 DIAGNOSIS — I2510 Atherosclerotic heart disease of native coronary artery without angina pectoris: Secondary | ICD-10-CM | POA: Diagnosis not present

## 2018-12-10 DIAGNOSIS — I5032 Chronic diastolic (congestive) heart failure: Secondary | ICD-10-CM | POA: Diagnosis not present

## 2018-12-10 DIAGNOSIS — G4733 Obstructive sleep apnea (adult) (pediatric): Secondary | ICD-10-CM | POA: Diagnosis not present

## 2018-12-11 DIAGNOSIS — Z20828 Contact with and (suspected) exposure to other viral communicable diseases: Secondary | ICD-10-CM | POA: Diagnosis not present

## 2018-12-11 DIAGNOSIS — Z01818 Encounter for other preprocedural examination: Secondary | ICD-10-CM | POA: Diagnosis not present

## 2018-12-11 DIAGNOSIS — M4802 Spinal stenosis, cervical region: Secondary | ICD-10-CM | POA: Diagnosis not present

## 2018-12-13 DIAGNOSIS — I6529 Occlusion and stenosis of unspecified carotid artery: Secondary | ICD-10-CM | POA: Diagnosis present

## 2018-12-13 DIAGNOSIS — I499 Cardiac arrhythmia, unspecified: Secondary | ICD-10-CM | POA: Diagnosis not present

## 2018-12-13 DIAGNOSIS — E1143 Type 2 diabetes mellitus with diabetic autonomic (poly)neuropathy: Secondary | ICD-10-CM | POA: Diagnosis not present

## 2018-12-13 DIAGNOSIS — I5032 Chronic diastolic (congestive) heart failure: Secondary | ICD-10-CM | POA: Diagnosis not present

## 2018-12-13 DIAGNOSIS — Z794 Long term (current) use of insulin: Secondary | ICD-10-CM | POA: Diagnosis not present

## 2018-12-13 DIAGNOSIS — Z7982 Long term (current) use of aspirin: Secondary | ICD-10-CM | POA: Diagnosis not present

## 2018-12-13 DIAGNOSIS — K7581 Nonalcoholic steatohepatitis (NASH): Secondary | ICD-10-CM | POA: Diagnosis not present

## 2018-12-13 DIAGNOSIS — Z981 Arthrodesis status: Secondary | ICD-10-CM | POA: Diagnosis not present

## 2018-12-13 DIAGNOSIS — E785 Hyperlipidemia, unspecified: Secondary | ICD-10-CM | POA: Diagnosis not present

## 2018-12-13 DIAGNOSIS — E039 Hypothyroidism, unspecified: Secondary | ICD-10-CM | POA: Diagnosis not present

## 2018-12-13 DIAGNOSIS — G959 Disease of spinal cord, unspecified: Secondary | ICD-10-CM | POA: Diagnosis not present

## 2018-12-13 DIAGNOSIS — Z955 Presence of coronary angioplasty implant and graft: Secondary | ICD-10-CM | POA: Diagnosis not present

## 2018-12-13 DIAGNOSIS — R05 Cough: Secondary | ICD-10-CM | POA: Diagnosis not present

## 2018-12-13 DIAGNOSIS — M4802 Spinal stenosis, cervical region: Secondary | ICD-10-CM | POA: Diagnosis not present

## 2018-12-13 DIAGNOSIS — Z9884 Bariatric surgery status: Secondary | ICD-10-CM | POA: Diagnosis not present

## 2018-12-13 DIAGNOSIS — R739 Hyperglycemia, unspecified: Secondary | ICD-10-CM | POA: Diagnosis not present

## 2018-12-13 DIAGNOSIS — T380X5A Adverse effect of glucocorticoids and synthetic analogues, initial encounter: Secondary | ICD-10-CM | POA: Diagnosis not present

## 2018-12-13 DIAGNOSIS — D696 Thrombocytopenia, unspecified: Secondary | ICD-10-CM | POA: Diagnosis not present

## 2018-12-13 DIAGNOSIS — I2510 Atherosclerotic heart disease of native coronary artery without angina pectoris: Secondary | ICD-10-CM | POA: Diagnosis not present

## 2018-12-13 DIAGNOSIS — K3184 Gastroparesis: Secondary | ICD-10-CM | POA: Diagnosis present

## 2018-12-13 DIAGNOSIS — E1142 Type 2 diabetes mellitus with diabetic polyneuropathy: Secondary | ICD-10-CM | POA: Diagnosis present

## 2018-12-13 DIAGNOSIS — M5001 Cervical disc disorder with myelopathy,  high cervical region: Secondary | ICD-10-CM | POA: Diagnosis present

## 2018-12-13 DIAGNOSIS — E1165 Type 2 diabetes mellitus with hyperglycemia: Secondary | ICD-10-CM | POA: Diagnosis present

## 2018-12-13 DIAGNOSIS — K7460 Unspecified cirrhosis of liver: Secondary | ICD-10-CM | POA: Diagnosis present

## 2018-12-13 DIAGNOSIS — I252 Old myocardial infarction: Secondary | ICD-10-CM | POA: Diagnosis not present

## 2018-12-13 DIAGNOSIS — E119 Type 2 diabetes mellitus without complications: Secondary | ICD-10-CM | POA: Diagnosis not present

## 2018-12-13 DIAGNOSIS — E118 Type 2 diabetes mellitus with unspecified complications: Secondary | ICD-10-CM | POA: Diagnosis not present

## 2018-12-13 DIAGNOSIS — Z7902 Long term (current) use of antithrombotics/antiplatelets: Secondary | ICD-10-CM | POA: Diagnosis not present

## 2018-12-13 DIAGNOSIS — K219 Gastro-esophageal reflux disease without esophagitis: Secondary | ICD-10-CM | POA: Diagnosis not present

## 2018-12-13 DIAGNOSIS — I10 Essential (primary) hypertension: Secondary | ICD-10-CM | POA: Diagnosis not present

## 2018-12-13 DIAGNOSIS — Z6841 Body Mass Index (BMI) 40.0 and over, adult: Secondary | ICD-10-CM | POA: Diagnosis not present

## 2018-12-13 DIAGNOSIS — G9589 Other specified diseases of spinal cord: Secondary | ICD-10-CM | POA: Diagnosis not present

## 2018-12-13 DIAGNOSIS — G4733 Obstructive sleep apnea (adult) (pediatric): Secondary | ICD-10-CM | POA: Diagnosis not present

## 2018-12-13 DIAGNOSIS — H9193 Unspecified hearing loss, bilateral: Secondary | ICD-10-CM | POA: Diagnosis not present

## 2018-12-13 DIAGNOSIS — I110 Hypertensive heart disease with heart failure: Secondary | ICD-10-CM | POA: Diagnosis not present

## 2018-12-13 DIAGNOSIS — F4321 Adjustment disorder with depressed mood: Secondary | ICD-10-CM | POA: Diagnosis not present

## 2018-12-13 DIAGNOSIS — E669 Obesity, unspecified: Secondary | ICD-10-CM | POA: Diagnosis not present

## 2018-12-14 DIAGNOSIS — Z9049 Acquired absence of other specified parts of digestive tract: Secondary | ICD-10-CM

## 2018-12-14 DIAGNOSIS — Z6841 Body Mass Index (BMI) 40.0 and over, adult: Secondary | ICD-10-CM | POA: Diagnosis not present

## 2018-12-14 DIAGNOSIS — I110 Hypertensive heart disease with heart failure: Secondary | ICD-10-CM | POA: Diagnosis not present

## 2018-12-14 DIAGNOSIS — K219 Gastro-esophageal reflux disease without esophagitis: Secondary | ICD-10-CM | POA: Diagnosis not present

## 2018-12-14 DIAGNOSIS — K7581 Nonalcoholic steatohepatitis (NASH): Secondary | ICD-10-CM | POA: Diagnosis not present

## 2018-12-14 DIAGNOSIS — Z794 Long term (current) use of insulin: Secondary | ICD-10-CM

## 2018-12-14 DIAGNOSIS — E119 Type 2 diabetes mellitus without complications: Secondary | ICD-10-CM | POA: Diagnosis not present

## 2018-12-14 DIAGNOSIS — G4733 Obstructive sleep apnea (adult) (pediatric): Secondary | ICD-10-CM | POA: Diagnosis not present

## 2018-12-14 DIAGNOSIS — I5032 Chronic diastolic (congestive) heart failure: Secondary | ICD-10-CM | POA: Diagnosis not present

## 2018-12-14 DIAGNOSIS — E669 Obesity, unspecified: Secondary | ICD-10-CM | POA: Diagnosis not present

## 2018-12-14 DIAGNOSIS — I2510 Atherosclerotic heart disease of native coronary artery without angina pectoris: Secondary | ICD-10-CM | POA: Diagnosis not present

## 2018-12-14 DIAGNOSIS — H9193 Unspecified hearing loss, bilateral: Secondary | ICD-10-CM | POA: Diagnosis not present

## 2018-12-14 DIAGNOSIS — E785 Hyperlipidemia, unspecified: Secondary | ICD-10-CM | POA: Diagnosis not present

## 2018-12-14 DIAGNOSIS — E039 Hypothyroidism, unspecified: Secondary | ICD-10-CM | POA: Diagnosis not present

## 2018-12-14 HISTORY — PX: ANTERIOR CERVICAL DECOMP/DISCECTOMY FUSION: SHX1161

## 2018-12-15 MED ORDER — Medication
1.00 | Status: DC
Start: 2018-12-16 — End: 2018-12-15

## 2018-12-15 MED ORDER — ARMOUR THYROID 30 MG PO TABS
1.00 | ORAL_TABLET | ORAL | Status: DC
Start: ? — End: 2018-12-15

## 2018-12-15 MED ORDER — QUINERVA 260 MG PO TABS
325.00 | ORAL_TABLET | ORAL | Status: DC
Start: ? — End: 2018-12-15

## 2018-12-15 MED ORDER — BILIRUBIN (URINE) TEST VI
180.00 | Status: DC
Start: 2018-12-16 — End: 2018-12-15

## 2018-12-15 MED ORDER — PRIMAQUINE PHOSPHATE POWD
1.00 | Status: DC
Start: ? — End: 2018-12-15

## 2018-12-15 MED ORDER — BUPIVACAINE-EPINEPHRINE 0.75 % IJ SOLN
10.00 | INTRAMUSCULAR | Status: DC
Start: 2018-12-15 — End: 2018-12-15

## 2018-12-15 MED ORDER — GUANABENZ ACETATE 4 MG PO TABS
5.00 | ORAL_TABLET | ORAL | Status: DC
Start: ? — End: 2018-12-15

## 2018-12-15 MED ORDER — X-5 THERAPEUTIC EX
1.00 | CUTANEOUS | Status: DC
Start: ? — End: 2018-12-15

## 2018-12-15 MED ORDER — Medication
5.00 | Status: DC
Start: ? — End: 2018-12-15

## 2018-12-15 MED ORDER — Medication
Status: DC
Start: ? — End: 2018-12-15

## 2018-12-15 MED ORDER — LIPOPEN ULTRA EX
400.00 | CUTANEOUS | Status: DC
Start: ? — End: 2018-12-15

## 2018-12-15 MED ORDER — PANATUSS 5-2-15-100 MG/5ML OR SYRP
20.00 | ORAL_SOLUTION | ORAL | Status: DC
Start: 2018-12-16 — End: 2018-12-15

## 2018-12-15 MED ORDER — PREMIER CLOSED MINI-POUCH 55MM POUCH MISC
1000.00 | Status: DC
Start: 2018-12-15 — End: 2018-12-15

## 2018-12-15 MED ORDER — TRAMADOL HCL 50 MG PO TABS
50.00 | ORAL_TABLET | ORAL | Status: DC
Start: ? — End: 2018-12-15

## 2018-12-15 MED ORDER — Medication
60.00 | Status: DC
Start: 2018-12-15 — End: 2018-12-15

## 2018-12-15 MED ORDER — SELECT BRAND INSULIN SYRINGE 29G X 1/2" 1 ML MISC
2.00 | Status: DC
Start: 2018-12-16 — End: 2018-12-15

## 2018-12-15 MED ORDER — RICOLA HERB MT
80.00 | OROMUCOSAL | Status: DC
Start: ? — End: 2018-12-15

## 2018-12-15 MED ORDER — SENNA-PSYLLIUM 18-82 % PO GRAN
125.00 | GRANULES | ORAL | Status: DC
Start: 2018-12-16 — End: 2018-12-15

## 2018-12-15 MED ORDER — VICON FORTE PO CAPS
17.00 | ORAL_CAPSULE | ORAL | Status: DC
Start: 2018-12-16 — End: 2018-12-15

## 2018-12-15 MED ORDER — METRONID-TETRACYC-BIS SUBSAL PO MISC
10.00 | ORAL | Status: DC
Start: 2018-12-16 — End: 2018-12-15

## 2018-12-15 MED ORDER — COMPOUND W FREEZE OFF EX AERO
0.00 | INHALATION_SPRAY | CUTANEOUS | Status: DC
Start: 2018-12-15 — End: 2018-12-15

## 2018-12-15 MED ORDER — PHENYLEPHRINE-GUAIFENESIN 30-400 MG PO CP12
5.00 | ORAL_CAPSULE | ORAL | Status: DC
Start: 2018-12-16 — End: 2018-12-15

## 2018-12-15 MED ORDER — Medication
1.00 | Status: DC
Start: 2018-12-15 — End: 2018-12-15

## 2018-12-15 MED ORDER — DICLOXACILLIN SODIUM 62.5 MG/5ML PO SUSR
10.00 | ORAL | Status: DC
Start: ? — End: 2018-12-15

## 2018-12-15 MED ORDER — APAP PO
20.00 | ORAL | Status: DC
Start: 2018-12-16 — End: 2018-12-15

## 2018-12-15 MED ORDER — DIOTAME PO
25.00 | ORAL | Status: DC
Start: 2018-12-15 — End: 2018-12-15

## 2018-12-15 MED ORDER — CVS EAR DROPS OT
40.00 | OTIC | Status: DC
Start: 2018-12-16 — End: 2018-12-15

## 2018-12-15 MED ORDER — ALBA-3 EX
400.00 | CUTANEOUS | Status: DC
Start: 2018-12-15 — End: 2018-12-15

## 2018-12-15 MED ORDER — BAYER WOMENS 81-300 MG PO TABS
40.00 | ORAL_TABLET | ORAL | Status: DC
Start: 2018-12-16 — End: 2018-12-15

## 2018-12-15 MED ORDER — Medication
30.00 | Status: DC
Start: ? — End: 2018-12-15

## 2018-12-15 MED ORDER — IFOSFAMIDE IV
1000.00 | INTRAVENOUS | Status: DC
Start: 2018-12-15 — End: 2018-12-15

## 2018-12-15 MED ORDER — HM VITAMIN C 1000 MG PO TABS
25.00 | ORAL_TABLET | ORAL | Status: DC
Start: 2018-12-16 — End: 2018-12-15

## 2018-12-15 MED ORDER — COMPOUND W FREEZE OFF EX AERO
10.00 | INHALATION_SPRAY | CUTANEOUS | Status: DC
Start: 2018-12-15 — End: 2018-12-15

## 2018-12-15 MED ORDER — BENICAR 20 MG PO TABS
12.50 | ORAL_TABLET | ORAL | Status: DC
Start: ? — End: 2018-12-15

## 2018-12-15 MED ORDER — SOBA SUPHEDRINE SINUS MAX ST 30-500 MG PO TABS
20.00 | ORAL_TABLET | ORAL | Status: DC
Start: 2018-12-16 — End: 2018-12-15

## 2018-12-17 ENCOUNTER — Other Ambulatory Visit: Payer: Self-pay | Admitting: Family Medicine

## 2018-12-17 ENCOUNTER — Encounter: Payer: Self-pay | Admitting: Family Medicine

## 2018-12-25 ENCOUNTER — Other Ambulatory Visit: Payer: Self-pay | Admitting: Family Medicine

## 2018-12-29 DIAGNOSIS — Z9049 Acquired absence of other specified parts of digestive tract: Secondary | ICD-10-CM | POA: Diagnosis not present

## 2018-12-29 DIAGNOSIS — G4733 Obstructive sleep apnea (adult) (pediatric): Secondary | ICD-10-CM | POA: Diagnosis not present

## 2018-12-29 DIAGNOSIS — E785 Hyperlipidemia, unspecified: Secondary | ICD-10-CM | POA: Diagnosis not present

## 2018-12-29 DIAGNOSIS — E039 Hypothyroidism, unspecified: Secondary | ICD-10-CM | POA: Diagnosis not present

## 2018-12-29 DIAGNOSIS — Z6841 Body Mass Index (BMI) 40.0 and over, adult: Secondary | ICD-10-CM | POA: Diagnosis not present

## 2018-12-29 DIAGNOSIS — K219 Gastro-esophageal reflux disease without esophagitis: Secondary | ICD-10-CM | POA: Diagnosis not present

## 2018-12-29 DIAGNOSIS — H9193 Unspecified hearing loss, bilateral: Secondary | ICD-10-CM | POA: Diagnosis not present

## 2018-12-29 DIAGNOSIS — K7581 Nonalcoholic steatohepatitis (NASH): Secondary | ICD-10-CM | POA: Diagnosis not present

## 2018-12-29 DIAGNOSIS — Z794 Long term (current) use of insulin: Secondary | ICD-10-CM | POA: Diagnosis not present

## 2018-12-29 DIAGNOSIS — E669 Obesity, unspecified: Secondary | ICD-10-CM | POA: Diagnosis not present

## 2018-12-29 DIAGNOSIS — I110 Hypertensive heart disease with heart failure: Secondary | ICD-10-CM | POA: Diagnosis not present

## 2018-12-29 DIAGNOSIS — E119 Type 2 diabetes mellitus without complications: Secondary | ICD-10-CM | POA: Diagnosis not present

## 2018-12-29 DIAGNOSIS — I2510 Atherosclerotic heart disease of native coronary artery without angina pectoris: Secondary | ICD-10-CM | POA: Diagnosis not present

## 2018-12-29 DIAGNOSIS — I5032 Chronic diastolic (congestive) heart failure: Secondary | ICD-10-CM | POA: Diagnosis not present

## 2019-01-01 ENCOUNTER — Telehealth: Payer: Self-pay

## 2019-01-01 DIAGNOSIS — E785 Hyperlipidemia, unspecified: Secondary | ICD-10-CM | POA: Diagnosis not present

## 2019-01-01 DIAGNOSIS — I5032 Chronic diastolic (congestive) heart failure: Secondary | ICD-10-CM | POA: Diagnosis not present

## 2019-01-01 DIAGNOSIS — I110 Hypertensive heart disease with heart failure: Secondary | ICD-10-CM | POA: Diagnosis not present

## 2019-01-01 DIAGNOSIS — I2510 Atherosclerotic heart disease of native coronary artery without angina pectoris: Secondary | ICD-10-CM | POA: Diagnosis not present

## 2019-01-01 DIAGNOSIS — E119 Type 2 diabetes mellitus without complications: Secondary | ICD-10-CM | POA: Diagnosis not present

## 2019-01-01 DIAGNOSIS — G4733 Obstructive sleep apnea (adult) (pediatric): Secondary | ICD-10-CM | POA: Diagnosis not present

## 2019-01-01 NOTE — Telephone Encounter (Signed)
Olin Hauser nurse with [facility] left v/m requesting verbal orders for [facility] nursing 2 x a wk for 4 wks and 1 x a wk for 3 wks. Olin Hauser also request verbal order for PT eval and treat due to pt falling.Please advise.

## 2019-01-01 NOTE — Telephone Encounter (Signed)
Agree with above 

## 2019-01-02 NOTE — Telephone Encounter (Signed)
Spoke with Olin Hauser informing her Dr. Darnell Level is giving verbal orders for services requested.

## 2019-01-03 ENCOUNTER — Telehealth: Payer: Self-pay

## 2019-01-03 DIAGNOSIS — I5032 Chronic diastolic (congestive) heart failure: Secondary | ICD-10-CM | POA: Diagnosis not present

## 2019-01-03 DIAGNOSIS — G4733 Obstructive sleep apnea (adult) (pediatric): Secondary | ICD-10-CM | POA: Diagnosis not present

## 2019-01-03 DIAGNOSIS — I110 Hypertensive heart disease with heart failure: Secondary | ICD-10-CM | POA: Diagnosis not present

## 2019-01-03 DIAGNOSIS — E119 Type 2 diabetes mellitus without complications: Secondary | ICD-10-CM | POA: Diagnosis not present

## 2019-01-03 DIAGNOSIS — E785 Hyperlipidemia, unspecified: Secondary | ICD-10-CM | POA: Diagnosis not present

## 2019-01-03 DIAGNOSIS — I2510 Atherosclerotic heart disease of native coronary artery without angina pectoris: Secondary | ICD-10-CM | POA: Diagnosis not present

## 2019-01-03 NOTE — Telephone Encounter (Signed)
Dwyane Luo PT with [facility] left v/m requesting verbal orders for [facility] PT 2 x a wk for 2 wks and 1 x a wk for 4 wks.

## 2019-01-03 NOTE — Telephone Encounter (Signed)
Agree with this. Thanks.  

## 2019-01-04 NOTE — Telephone Encounter (Signed)
Spoke with [name] informing her Dr. Darnell Level is giving verbal orders for services requested.

## 2019-01-07 ENCOUNTER — Other Ambulatory Visit: Payer: Self-pay

## 2019-01-07 ENCOUNTER — Ambulatory Visit (INDEPENDENT_AMBULATORY_CARE_PROVIDER_SITE_OTHER): Payer: Medicare Other | Admitting: Family Medicine

## 2019-01-07 ENCOUNTER — Encounter: Payer: Self-pay | Admitting: Family Medicine

## 2019-01-07 VITALS — BP 116/62 | HR 84 | Temp 98.4°F | Ht 70.5 in | Wt 305.4 lb

## 2019-01-07 DIAGNOSIS — I2510 Atherosclerotic heart disease of native coronary artery without angina pectoris: Secondary | ICD-10-CM

## 2019-01-07 DIAGNOSIS — E1165 Type 2 diabetes mellitus with hyperglycemia: Secondary | ICD-10-CM

## 2019-01-07 DIAGNOSIS — D649 Anemia, unspecified: Secondary | ICD-10-CM | POA: Diagnosis not present

## 2019-01-07 DIAGNOSIS — K7581 Nonalcoholic steatohepatitis (NASH): Secondary | ICD-10-CM | POA: Diagnosis not present

## 2019-01-07 DIAGNOSIS — G959 Disease of spinal cord, unspecified: Secondary | ICD-10-CM

## 2019-01-07 DIAGNOSIS — K7460 Unspecified cirrhosis of liver: Secondary | ICD-10-CM | POA: Diagnosis not present

## 2019-01-07 DIAGNOSIS — E119 Type 2 diabetes mellitus without complications: Secondary | ICD-10-CM | POA: Diagnosis not present

## 2019-01-07 DIAGNOSIS — I208 Other forms of angina pectoris: Secondary | ICD-10-CM | POA: Diagnosis not present

## 2019-01-07 DIAGNOSIS — R0602 Shortness of breath: Secondary | ICD-10-CM

## 2019-01-07 DIAGNOSIS — G4733 Obstructive sleep apnea (adult) (pediatric): Secondary | ICD-10-CM | POA: Diagnosis not present

## 2019-01-07 DIAGNOSIS — I5032 Chronic diastolic (congestive) heart failure: Secondary | ICD-10-CM | POA: Diagnosis not present

## 2019-01-07 DIAGNOSIS — I110 Hypertensive heart disease with heart failure: Secondary | ICD-10-CM | POA: Diagnosis not present

## 2019-01-07 DIAGNOSIS — E11319 Type 2 diabetes mellitus with unspecified diabetic retinopathy without macular edema: Secondary | ICD-10-CM | POA: Diagnosis not present

## 2019-01-07 DIAGNOSIS — E785 Hyperlipidemia, unspecified: Secondary | ICD-10-CM | POA: Diagnosis not present

## 2019-01-07 DIAGNOSIS — IMO0002 Reserved for concepts with insufficient information to code with codable children: Secondary | ICD-10-CM

## 2019-01-07 LAB — CBC WITH DIFFERENTIAL/PLATELET
Basophils Absolute: 0 10*3/uL (ref 0.0–0.1)
Basophils Relative: 0.5 % (ref 0.0–3.0)
Eosinophils Absolute: 0.1 10*3/uL (ref 0.0–0.7)
Eosinophils Relative: 1.8 % (ref 0.0–5.0)
HCT: 40.1 % (ref 39.0–52.0)
Hemoglobin: 13.1 g/dL (ref 13.0–17.0)
Lymphocytes Relative: 18.3 % (ref 12.0–46.0)
Lymphs Abs: 1 10*3/uL (ref 0.7–4.0)
MCHC: 32.7 g/dL (ref 30.0–36.0)
MCV: 76.3 fl — ABNORMAL LOW (ref 78.0–100.0)
Monocytes Absolute: 0.6 10*3/uL (ref 0.1–1.0)
Monocytes Relative: 10.2 % (ref 3.0–12.0)
Neutro Abs: 3.7 10*3/uL (ref 1.4–7.7)
Neutrophils Relative %: 69.2 % (ref 43.0–77.0)
Platelets: 165 10*3/uL (ref 150.0–400.0)
RBC: 5.25 Mil/uL (ref 4.22–5.81)
RDW: 18 % — ABNORMAL HIGH (ref 11.5–15.5)
WBC: 5.4 10*3/uL (ref 4.0–10.5)

## 2019-01-07 LAB — COMPREHENSIVE METABOLIC PANEL
ALT: 38 U/L (ref 0–53)
AST: 45 U/L — ABNORMAL HIGH (ref 0–37)
Albumin: 4.5 g/dL (ref 3.5–5.2)
Alkaline Phosphatase: 179 U/L — ABNORMAL HIGH (ref 39–117)
BUN: 14 mg/dL (ref 6–23)
CO2: 26 mEq/L (ref 19–32)
Calcium: 9.7 mg/dL (ref 8.4–10.5)
Chloride: 100 mEq/L (ref 96–112)
Creatinine, Ser: 0.99 mg/dL (ref 0.40–1.50)
GFR: 76.14 mL/min (ref >60.00)
Glucose, Bld: 162 mg/dL — ABNORMAL HIGH (ref 70–99)
Potassium: 4.2 mEq/L (ref 3.5–5.1)
Sodium: 137 mEq/L (ref 135–145)
Total Bilirubin: 1.1 mg/dL (ref 0.2–1.2)
Total Protein: 6.4 g/dL (ref 6.0–8.3)

## 2019-01-07 LAB — FERRITIN: Ferritin: 13.1 ng/mL — ABNORMAL LOW (ref 22.0–322.0)

## 2019-01-07 LAB — IBC PANEL
Iron: 36 ug/dL — ABNORMAL LOW (ref 42–165)
Saturation Ratios: 8.5 % — ABNORMAL LOW (ref 20.0–50.0)
Transferrin: 303 mg/dL (ref 212.0–360.0)

## 2019-01-07 MED ORDER — DIAZEPAM 5 MG PO TABS: 5.0000 mg | ORAL_TABLET | Freq: Two times a day (BID) | ORAL | 1 refills | Status: DC | PRN

## 2019-01-07 NOTE — Assessment & Plan Note (Signed)
Has been unable to tolerate CPAP.

## 2019-01-07 NOTE — Progress Notes (Signed)
This visit was conducted in person.  BP 116/62 (BP Location: Left Arm, Patient Position: Sitting, Cuff Size: Large)   Pulse 84   Temp 98.4 F (36.9 C) (Temporal)   Ht 5' 10.5" (1.791 m)   Wt (!) 305 lb 6 oz (138.5 kg)   SpO2 97%   BMI 43.20 kg/m    CC: hops f/u visit Subjective:    Patient ID: Chad [name], male    DOB: [birth year], 63 y.o.   MRN: [id number]  HPI: Chad [name] is a 63 y.o. male presenting on 01/07/2019 for Hospitalization Follow-up (Pt accompanied by wife, [name] Pulse (temp, 98.4).  Requests new rx for tramdol.  Wants to discuss portable O2, per [facility] nurse. )   Recent hospitalization at [facility] s/p C3-4 ACDF for cervical myelopathy after recurrent fall. Did well with surgery and recovery. Did require dexamethasone course for dysarthria as well as supplemental O2 by [place] (2L) overnight POD1. Aspirin, plavix restarted on post-op day 5 and 14. Treated post-op pain with oxycodone 46m - now off this, only taking valium and tramadol.   [facility] PT/OT coming out to house (took them 3 wks to start care). [facility].   Noted 20 lb weight loss in the past month - attributes to poor appetite.   Ongoing dyspnea worse with exertion, has been told would benefit from supplemental oxygen however never able to document ambulatory desat despite marked dyspnea with exertion (repeated standing and sitting) or bending over. Remotely saw Dr SAlva Garnet no recent pulm eval.   [facility] SGenn[facility] Pacfor known CAD s/p PCI to [facility](12/2016 for unstable angina) with persistent chronic angina (thought microvascular ischemia) despite amlodipine and high dose isosorbide, and HFpEF on bumex and metolazone diuretic managed by [facility] RN.   Admit Date: 12/13/2018 Discharge Date: 12/15/2018 Admitting Physician: SMalen Gauze MD  TCM hosp f/u phone call - not completed  Discharge Diagnoses:  Principal Problem: Cervical myelopathy (CMS-HCC)      Relevant  past medical, surgical, family and social history reviewed and updated as indicated. Interim medical history since our last visit reviewed. Allergies and medications reviewed and updated. Outpatient Medications Prior to Visit  Medication Sig Dispense Refill  . amLODipine (NORVASC) 5 MG tablet Take 1 tablet (5 mg total) by mouth daily.    .Marland Kitchenaspirin 81 MG chewable tablet Chew 81 mg by mouth daily.     .Marland Kitchenatorvastatin (LIPITOR) 20 MG tablet Take 1 tablet (20 mg total) by mouth daily.    . bumetanide (BUMEX) 2 MG tablet Take 1 tablet (2 mg total) by mouth 2 (two) times daily as needed (weight gain/leg swelling).    . citalopram (CELEXA) 20 MG tablet TAKE 1 TABLET BY MOUTH ONCE DAILY 90 tablet 0  . clopidogrel (PLAVIX) 75 MG tablet Take 1 tablet (75 mg total) by mouth daily.    . clotrimazole (LOTRIMIN) 1 % cream Apply 1 application topically 2 (two) times daily. 60 g 0  . clotrimazole-betamethasone (LOTRISONE) cream Apply 1 application topically 2 (two) times daily. 30 g 0  . dapagliflozin propanediol (FARXIGA) 5 MG TABS tablet Take 5 mg by mouth daily before breakfast.    . DEXILANT 60 MG capsule TAKE 1 CAPSULE BY MOUTH ONCE DAILY (Patient taking differently: Take 60 mg by mouth daily. ) 30 capsule 5  . diclofenac sodium (VOLTAREN) 1 % GEL Apply 1 application topically 3 (three) times daily. 1 Tube 1  . Docusate  Sodium (DSS) 100 MG CAPS 1 tab 2 times a day while on narcotics.  STOOL SOFTENER 60 each 0  . famotidine (PEPCID) 20 MG tablet TAKE 1 TABLET BY MOUTH EVERY NIGHT AT BEDTIME 90 tablet 1  . fluticasone (FLONASE) 50 MCG/ACT nasal spray PLACE 2 SPRAYS INTO BOTH NOSTRILS DAILY 16 g 2  . Insulin Pen Needle (ULTICARE MINI PEN NEEDLES) 31G X 6 MM MISC USE AS DIRECTED FOR LANTUS SOLOSTAR PEN 100 each 3  . isosorbide mononitrate (IMDUR) 60 MG 24 hr tablet TAKE 3 TABLETS BY MOUTH ONCE DAILY 90 tablet 5  . LANTUS SOLOSTAR 100 UNIT/ML Solostar Pen INJECT 50 UNITS INTO THE SKIN TWICE DAILY 30 mL 5  .  levothyroxine (SYNTHROID) 125 MCG tablet TAKE 1 TAB BY MOUTH ONCE DAILY. TAKE ON AN EMPTY STOMACH WITH A GLASSOF WATER ATLEAST 30-60 MINUTES BEFORE BREAKFAST 30 tablet 5  . lisinopril (PRINIVIL,ZESTRIL) 20 MG tablet Take 1 tablet (20 mg total) by mouth daily. 90 tablet 3  . magnesium oxide (MAG-OX) 400 MG tablet Take 1 tablet by mouth 2 (two) times daily.    . metFORMIN (GLUCOPHAGE) 1000 MG tablet TAKE 1 TABLET BY MOUTH TWICE A DAY WITH A MEAL 180 tablet 1  . metoCLOPramide (REGLAN) 10 MG tablet Take 0.5-1 tablets (5-10 mg total) by mouth 3 (three) times daily before meals. 1 tablet 0  . metolazone (ZAROXOLYN) 2.5 MG tablet Take 1 tablet (2.5 mg total) by mouth daily as needed (for more than 3lb/day or 5lb/wk weight gain).    . metoprolol succinate (TOPROL-XL) 50 MG 24 hr tablet TAKE 1 TABLET BY MOUTH ONCE DAILY 90 tablet 1  . Multiple Vitamin (MULITIVITAMIN WITH MINERALS) TABS Take 1 tablet by mouth daily.    . nitroGLYCERIN (NITROSTAT) 0.4 MG SL tablet Place 1 tablet (0.4 mg total) under the tongue every 5 (five) minutes as needed for chest pain. 60 tablet 3  . polyethylene glycol (MIRALAX / GLYCOLAX) packet 17grams in 16 oz of water twice a day until bowel movement.  LAXITIVE.  Restart if two days since last bowel movement (Patient taking differently: 17grams in 16 oz of water once daily  LAXITIVE.  Restart if two days since last bowel movement) 14 each 0  . potassium chloride (K-DUR,KLOR-CON) 10 MEQ tablet Take 10 mEq by mouth 2 (two) times daily.    . Semaglutide,0.25 or 0.5MG/DOS, (OZEMPIC, 0.25 OR 0.5 MG/DOSE,) 2 MG/1.5ML SOPN Inject 0.5 mg into the skin once a week. 1 pen 6  . spironolactone (ALDACTONE) 25 MG tablet Take 1 tablet by mouth daily.    . sucralfate (CARAFATE) 1 g tablet TAKE 1 TABLET BY MOUTH TWICE (2) DAILY BEFORE A MEAL 60 tablet 1  . VENTOLIN HFA 108 (90 Base) MCG/ACT inhaler INHALE 2 PUFFS INTO THE LUNGS EVERY 6 HOURS AS NEEDED FOR WHEEZING OR SHORTNESS OF BREATH 18 g 5  .  vitamin E 400 UNIT capsule Take 400 Units by mouth daily.    . diazepam (VALIUM) 5 MG tablet Take 1 tablet (5 mg total) by mouth every 12 (twelve) hours as needed for anxiety. 30 tablet 1   No facility-administered medications prior to visit.      Per HPI unless specifically indicated in ROS section below Review of Systems Objective:    BP 116/62 (BP Location: Left Arm, Patient Position: Sitting, Cuff Size: Large)   Pulse 84   Temp 98.4 F (36.9 C) (Temporal)   Ht 5' 10.5" (1.791 m)   Wt (!) 305  lb 6 oz (138.5 kg)   SpO2 97%   BMI 43.20 kg/m   Wt Readings from Last 3 Encounters:  01/07/19 (!) 305 lb 6 oz (138.5 kg)  11/26/18 (!) 324 lb (147 kg)  11/21/18 (!) 314 lb (142.4 kg)    Physical Exam Vitals signs and nursing note reviewed.  Constitutional:      General: He is not in acute distress.    Appearance: Normal appearance. He is obese. He is ill-appearing (chronic).  HENT:     Mouth/Throat:     Mouth: Mucous membranes are moist.     Pharynx: Oropharynx is clear. No posterior oropharyngeal erythema.     Comments: S/p uvuloplasty Cardiovascular:     Rate and Rhythm: Normal rate and regular rhythm.     Pulses: Normal pulses.     Heart sounds: Normal heart sounds. No murmur.  Pulmonary:     Effort: Pulmonary effort is normal. No respiratory distress.     Breath sounds: Normal breath sounds. No wheezing, rhonchi or rales.  Abdominal:     General: There is no distension.     Palpations: Abdomen is soft.     Tenderness: There is no abdominal tenderness.  Musculoskeletal:     Right lower leg: No edema.     Left lower leg: No edema.  Skin:    Findings: No rash.  Neurological:     Mental Status: He is alert.       Results for orders placed or performed in visit on 11/22/18  Protime-INR  Result Value Ref Range   INR 1.2 (H) 0.8 - 1.0 ratio   Prothrombin Time 13.9 (H) 9.6 - 13.1 sec  PSA, Medicare  Result Value Ref Range   PSA 0.84 0.10 - 4.00 ng/ml  CBC with  Differential/Platelet  Result Value Ref Range   WBC 4.3 4.0 - 10.5 K/uL   RBC 5.17 4.22 - 5.81 Mil/uL   Hemoglobin 12.7 (L) 13.0 - 17.0 g/dL   HCT 39.3 39.0 - 52.0 %   MCV 75.9 (L) 78.0 - 100.0 fl   MCHC 32.3 30.0 - 36.0 g/dL   RDW 17.1 (H) 11.5 - 15.5 %   Platelets 122.0 (L) 150.0 - 400.0 K/uL   Neutrophils Relative % 73.5 43.0 - 77.0 %   Lymphocytes Relative 13.8 12.0 - 46.0 %   Monocytes Relative 9.9 3.0 - 12.0 %   Eosinophils Relative 2.0 0.0 - 5.0 %   Basophils Relative 0.8 0.0 - 3.0 %   Neutro Abs 3.2 1.4 - 7.7 K/uL   Lymphs Abs 0.6 (L) 0.7 - 4.0 K/uL   Monocytes Absolute 0.4 0.1 - 1.0 K/uL   Eosinophils Absolute 0.1 0.0 - 0.7 K/uL   Basophils Absolute 0.0 0.0 - 0.1 K/uL  Hemoglobin A1c  Result Value Ref Range   Hgb A1c MFr Bld 7.0 (H) 4.6 - 6.5 %  TSH  Result Value Ref Range   TSH 0.90 0.35 - 4.50 uIU/mL  Comprehensive metabolic panel  Result Value Ref Range   Sodium 137 135 - 145 mEq/L   Potassium 4.0 3.5 - 5.1 mEq/L   Chloride 101 96 - 112 mEq/L   CO2 26 19 - 32 mEq/L   Glucose, Bld 142 (H) 70 - 99 mg/dL   BUN 14 6 - 23 mg/dL   Creatinine, Ser 0.94 0.40 - 1.50 mg/dL   Total Bilirubin 1.1 0.2 - 1.2 mg/dL   Alkaline Phosphatase 156 (H) 39 - 117 U/L   AST 43 (H)  0 - 37 U/L   ALT 40 0 - 53 U/L   Total Protein 6.2 6.0 - 8.3 g/dL   Albumin 4.2 3.5 - 5.2 g/dL   Calcium 9.3 8.4 - 10.5 mg/dL   GFR 80.86 >60.00 mL/min  Lipid panel  Result Value Ref Range   Cholesterol 99 0 - 200 mg/dL   Triglycerides 117.0 0.0 - 149.0 mg/dL   HDL 37.70 (L) >39.00 mg/dL   VLDL 23.4 0.0 - 40.0 mg/dL   LDL Cholesterol 38 0 - 99 mg/dL   Total CHOL/HDL Ratio 3    NonHDL 61.21    Pulse ox at rest 97%, drops to 90% after sitting/standing x3 with evident dyspnea.   Assessment & Plan:   Problem List Items Addressed This Visit    Type 2 diabetes mellitus, uncontrolled, with retinopathy ([place])   Shortness of breath - Primary    Longstanding, ongoing, debilitating. Anticipate  multifactorial but largely due to microvascular ischemia. Unable to document desaturation so insurance will not cover supplement oxygen at this time. Consider ABG/VBG. Will refer to pulm for further evaluation/recommendations.       Relevant Orders   CBC with Differential   Ambulatory referral to Pulmonology   OSA (obstructive sleep apnea) (Chronic)    Has been unable to tolerate CPAP.       Obesity, Class III, BMI 40-49.9 (morbid obesity) (HCC) (Chronic)   Liver cirrhosis secondary to NASH (HCC) (Chronic)   Relevant Orders   Comprehensive metabolic panel   Coronary artery disease, non-occlusive (Chronic)   Chronic diastolic CHF (congestive heart failure) (HCC)    Weight loss noted. Followed by [facility]. Update labwork.       Relevant Orders   Ambulatory referral to Pulmonology   Chest pain   Cervical myelopathy [facility])    S/p recent ACDF, appreciate [facility] neurosurgery care       Anemia    Mild anemia present for months, check iron stores. Anemia could contribute to dyspnea if due to microvascular ischemia. Discussed may need to start iron supplement.       Relevant Orders   CBC with Differential   IBC panel   Ferritin       Meds ordered this encounter  Medications  . diazepam (VALIUM) 5 MG tablet    Sig: Take 1 tablet (5 mg total) by mouth every 12 (twelve) hours as needed for anxiety.    Dispense:  30 tablet    Refill:  1   Orders Placed This Encounter  Procedures  . CBC with Differential  . Comprehensive metabolic panel  . IBC panel  . Ferritin  . Ambulatory referral to Pulmonology    Referral Priority:   Routine    Referral Type:   Consultation    Referral Reason:   Specialty Services Required    Requested Specialty:   Pulmonary Disease    Number of Visits Requested:   1    Patient Instructions  Labs today I would like to refer you to lung doctor for further evaluation of shortness of breath. We will call you for an appointment.  Return in 1 month for  follow up visit.    Follow up plan: Return in about 4 weeks (around 02/04/2019) for follow up visit.  [name], MD

## 2019-01-07 NOTE — Assessment & Plan Note (Signed)
S/p recent ACDF, appreciate [facility] neurosurgery care

## 2019-01-07 NOTE — Assessment & Plan Note (Signed)
Weight loss noted. Followed by [facility]. Update labwork.

## 2019-01-07 NOTE — Assessment & Plan Note (Signed)
Longstanding, ongoing, debilitating. Anticipate multifactorial but largely due to microvascular ischemia. Unable to document desaturation so insurance will not cover supplement oxygen at this time. Consider ABG/VBG. Will refer to pulm for further evaluation/recommendations.

## 2019-01-07 NOTE — Patient Instructions (Addendum)
Labs today I would like to refer you to lung doctor for further evaluation of shortness of breath. We will call you for an appointment.  Return in 1 month for follow up visit.

## 2019-01-07 NOTE — Assessment & Plan Note (Addendum)
Mild anemia present for months, check iron stores. Anemia could contribute to dyspnea if due to microvascular ischemia. Discussed may need to start iron supplement.

## 2019-01-08 ENCOUNTER — Other Ambulatory Visit: Payer: Self-pay | Admitting: Family Medicine

## 2019-01-08 DIAGNOSIS — I110 Hypertensive heart disease with heart failure: Secondary | ICD-10-CM | POA: Diagnosis not present

## 2019-01-08 DIAGNOSIS — E119 Type 2 diabetes mellitus without complications: Secondary | ICD-10-CM | POA: Diagnosis not present

## 2019-01-08 DIAGNOSIS — E785 Hyperlipidemia, unspecified: Secondary | ICD-10-CM | POA: Diagnosis not present

## 2019-01-08 DIAGNOSIS — I5032 Chronic diastolic (congestive) heart failure: Secondary | ICD-10-CM | POA: Diagnosis not present

## 2019-01-08 DIAGNOSIS — G4733 Obstructive sleep apnea (adult) (pediatric): Secondary | ICD-10-CM | POA: Diagnosis not present

## 2019-01-08 DIAGNOSIS — I2510 Atherosclerotic heart disease of native coronary artery without angina pectoris: Secondary | ICD-10-CM | POA: Diagnosis not present

## 2019-01-09 ENCOUNTER — Encounter: Payer: Self-pay | Admitting: Family Medicine

## 2019-01-09 ENCOUNTER — Other Ambulatory Visit: Payer: Self-pay | Admitting: Family Medicine

## 2019-01-09 DIAGNOSIS — E611 Iron deficiency: Secondary | ICD-10-CM | POA: Insufficient documentation

## 2019-01-09 DIAGNOSIS — I5032 Chronic diastolic (congestive) heart failure: Secondary | ICD-10-CM | POA: Diagnosis not present

## 2019-01-09 DIAGNOSIS — I2510 Atherosclerotic heart disease of native coronary artery without angina pectoris: Secondary | ICD-10-CM | POA: Diagnosis not present

## 2019-01-09 DIAGNOSIS — E785 Hyperlipidemia, unspecified: Secondary | ICD-10-CM | POA: Diagnosis not present

## 2019-01-09 DIAGNOSIS — G4733 Obstructive sleep apnea (adult) (pediatric): Secondary | ICD-10-CM | POA: Diagnosis not present

## 2019-01-09 DIAGNOSIS — E119 Type 2 diabetes mellitus without complications: Secondary | ICD-10-CM | POA: Diagnosis not present

## 2019-01-09 DIAGNOSIS — I110 Hypertensive heart disease with heart failure: Secondary | ICD-10-CM | POA: Diagnosis not present

## 2019-01-09 MED ORDER — FERROUS SULFATE 325 (65 FE) MG PO TBEC: 325.0000 mg | DELAYED_RELEASE_TABLET | ORAL | Status: DC

## 2019-01-10 DIAGNOSIS — E785 Hyperlipidemia, unspecified: Secondary | ICD-10-CM | POA: Diagnosis not present

## 2019-01-10 DIAGNOSIS — I110 Hypertensive heart disease with heart failure: Secondary | ICD-10-CM | POA: Diagnosis not present

## 2019-01-10 DIAGNOSIS — G4733 Obstructive sleep apnea (adult) (pediatric): Secondary | ICD-10-CM | POA: Diagnosis not present

## 2019-01-10 DIAGNOSIS — E119 Type 2 diabetes mellitus without complications: Secondary | ICD-10-CM | POA: Diagnosis not present

## 2019-01-10 DIAGNOSIS — I5032 Chronic diastolic (congestive) heart failure: Secondary | ICD-10-CM | POA: Diagnosis not present

## 2019-01-10 DIAGNOSIS — I2510 Atherosclerotic heart disease of native coronary artery without angina pectoris: Secondary | ICD-10-CM | POA: Diagnosis not present

## 2019-01-14 DIAGNOSIS — E785 Hyperlipidemia, unspecified: Secondary | ICD-10-CM | POA: Diagnosis not present

## 2019-01-14 DIAGNOSIS — I110 Hypertensive heart disease with heart failure: Secondary | ICD-10-CM | POA: Diagnosis not present

## 2019-01-14 DIAGNOSIS — I2510 Atherosclerotic heart disease of native coronary artery without angina pectoris: Secondary | ICD-10-CM | POA: Diagnosis not present

## 2019-01-14 DIAGNOSIS — E119 Type 2 diabetes mellitus without complications: Secondary | ICD-10-CM | POA: Diagnosis not present

## 2019-01-14 DIAGNOSIS — G4733 Obstructive sleep apnea (adult) (pediatric): Secondary | ICD-10-CM | POA: Diagnosis not present

## 2019-01-14 DIAGNOSIS — I5032 Chronic diastolic (congestive) heart failure: Secondary | ICD-10-CM | POA: Diagnosis not present

## 2019-01-15 ENCOUNTER — Encounter: Payer: Self-pay | Admitting: Pulmonary Disease

## 2019-01-15 ENCOUNTER — Other Ambulatory Visit: Payer: Self-pay | Admitting: Family Medicine

## 2019-01-15 ENCOUNTER — Ambulatory Visit (INDEPENDENT_AMBULATORY_CARE_PROVIDER_SITE_OTHER): Payer: Medicare Other | Admitting: Pulmonary Disease

## 2019-01-15 ENCOUNTER — Other Ambulatory Visit: Payer: Self-pay

## 2019-01-15 VITALS — BP 128/74 | HR 89 | Temp 97.9°F | Ht 70.0 in | Wt 301.0 lb

## 2019-01-15 DIAGNOSIS — E662 Morbid (severe) obesity with alveolar hypoventilation: Secondary | ICD-10-CM

## 2019-01-15 DIAGNOSIS — R0602 Shortness of breath: Secondary | ICD-10-CM | POA: Diagnosis not present

## 2019-01-15 DIAGNOSIS — F418 Other specified anxiety disorders: Secondary | ICD-10-CM | POA: Diagnosis not present

## 2019-01-15 NOTE — Patient Instructions (Signed)
1.  We are going to arrange for breathing tests and a blood oxygen test.  2.  We are also going to arrange for a heart test to evaluate the pressure between the artery going from the heart to the lungs and also to make sure there is no communication of blood between the chambers and your heart.  3.  We will obtain reading of your oxygen levels at nighttime.  4.  Continue doing breathing exercises you are doing a lot of breathing from your upper chest this limits how much oxygen you get to the bottom portion of your lungs.  Using a harmonica up to assist with your breathing exercises can help.  See the website:  SSLUsers.ch  5.  We will see you in follow-up in 3 to 4 weeks time.  They are with me or the nurse practitioner.

## 2019-01-16 DIAGNOSIS — E119 Type 2 diabetes mellitus without complications: Secondary | ICD-10-CM | POA: Diagnosis not present

## 2019-01-16 DIAGNOSIS — I110 Hypertensive heart disease with heart failure: Secondary | ICD-10-CM | POA: Diagnosis not present

## 2019-01-16 DIAGNOSIS — G4733 Obstructive sleep apnea (adult) (pediatric): Secondary | ICD-10-CM | POA: Diagnosis not present

## 2019-01-16 DIAGNOSIS — I5032 Chronic diastolic (congestive) heart failure: Secondary | ICD-10-CM | POA: Diagnosis not present

## 2019-01-16 DIAGNOSIS — I2510 Atherosclerotic heart disease of native coronary artery without angina pectoris: Secondary | ICD-10-CM | POA: Diagnosis not present

## 2019-01-16 DIAGNOSIS — E785 Hyperlipidemia, unspecified: Secondary | ICD-10-CM | POA: Diagnosis not present

## 2019-01-16 NOTE — Telephone Encounter (Signed)
Looks like this was prescribed by GI  Reglan last filled:  10/11/18, #90 Last OV:  01/07/19, hosp f/u Next OV:  02/06/19, 1 mo f/u

## 2019-01-18 DIAGNOSIS — E785 Hyperlipidemia, unspecified: Secondary | ICD-10-CM | POA: Diagnosis not present

## 2019-01-18 DIAGNOSIS — E119 Type 2 diabetes mellitus without complications: Secondary | ICD-10-CM | POA: Diagnosis not present

## 2019-01-18 DIAGNOSIS — I110 Hypertensive heart disease with heart failure: Secondary | ICD-10-CM | POA: Diagnosis not present

## 2019-01-18 DIAGNOSIS — I2510 Atherosclerotic heart disease of native coronary artery without angina pectoris: Secondary | ICD-10-CM | POA: Diagnosis not present

## 2019-01-18 DIAGNOSIS — I5032 Chronic diastolic (congestive) heart failure: Secondary | ICD-10-CM | POA: Diagnosis not present

## 2019-01-18 DIAGNOSIS — G4733 Obstructive sleep apnea (adult) (pediatric): Secondary | ICD-10-CM | POA: Diagnosis not present

## 2019-01-21 DIAGNOSIS — I5032 Chronic diastolic (congestive) heart failure: Secondary | ICD-10-CM | POA: Diagnosis not present

## 2019-01-21 DIAGNOSIS — E119 Type 2 diabetes mellitus without complications: Secondary | ICD-10-CM | POA: Diagnosis not present

## 2019-01-21 DIAGNOSIS — I2510 Atherosclerotic heart disease of native coronary artery without angina pectoris: Secondary | ICD-10-CM | POA: Diagnosis not present

## 2019-01-21 DIAGNOSIS — G4733 Obstructive sleep apnea (adult) (pediatric): Secondary | ICD-10-CM | POA: Diagnosis not present

## 2019-01-21 DIAGNOSIS — I110 Hypertensive heart disease with heart failure: Secondary | ICD-10-CM | POA: Diagnosis not present

## 2019-01-21 DIAGNOSIS — E785 Hyperlipidemia, unspecified: Secondary | ICD-10-CM | POA: Diagnosis not present

## 2019-01-22 DIAGNOSIS — Z981 Arthrodesis status: Secondary | ICD-10-CM | POA: Diagnosis not present

## 2019-01-22 DIAGNOSIS — M4802 Spinal stenosis, cervical region: Secondary | ICD-10-CM | POA: Diagnosis not present

## 2019-01-22 DIAGNOSIS — M4322 Fusion of spine, cervical region: Secondary | ICD-10-CM | POA: Diagnosis not present

## 2019-01-24 DIAGNOSIS — G4733 Obstructive sleep apnea (adult) (pediatric): Secondary | ICD-10-CM | POA: Diagnosis not present

## 2019-01-24 DIAGNOSIS — I110 Hypertensive heart disease with heart failure: Secondary | ICD-10-CM | POA: Diagnosis not present

## 2019-01-24 DIAGNOSIS — E785 Hyperlipidemia, unspecified: Secondary | ICD-10-CM | POA: Diagnosis not present

## 2019-01-24 DIAGNOSIS — I2510 Atherosclerotic heart disease of native coronary artery without angina pectoris: Secondary | ICD-10-CM | POA: Diagnosis not present

## 2019-01-24 DIAGNOSIS — I5032 Chronic diastolic (congestive) heart failure: Secondary | ICD-10-CM | POA: Diagnosis not present

## 2019-01-24 DIAGNOSIS — E119 Type 2 diabetes mellitus without complications: Secondary | ICD-10-CM | POA: Diagnosis not present

## 2019-01-25 ENCOUNTER — Telehealth: Payer: Self-pay

## 2019-01-25 NOTE — Telephone Encounter (Signed)
Dwyane Luo, with [facility], left message on triage line requesting continuation for Home health PT services for patient 1 times a week for 4 weeks. CB to [name] is [phone] leave message on her number if no answer.

## 2019-01-25 NOTE — Telephone Encounter (Signed)
Verbal orders left on verified VM  

## 2019-01-25 NOTE — Telephone Encounter (Signed)
Agree with this. Thank you.  

## 2019-01-28 ENCOUNTER — Encounter: Payer: Self-pay | Admitting: Family Medicine

## 2019-01-28 ENCOUNTER — Ambulatory Visit (INDEPENDENT_AMBULATORY_CARE_PROVIDER_SITE_OTHER): Payer: Medicare Other | Admitting: Family Medicine

## 2019-01-28 ENCOUNTER — Other Ambulatory Visit: Payer: Self-pay

## 2019-01-28 VITALS — BP 116/62 | HR 83 | Temp 97.9°F | Ht 70.0 in | Wt 308.1 lb

## 2019-01-28 DIAGNOSIS — I2510 Atherosclerotic heart disease of native coronary artery without angina pectoris: Secondary | ICD-10-CM | POA: Diagnosis not present

## 2019-01-28 DIAGNOSIS — Z794 Long term (current) use of insulin: Secondary | ICD-10-CM | POA: Diagnosis not present

## 2019-01-28 DIAGNOSIS — I5032 Chronic diastolic (congestive) heart failure: Secondary | ICD-10-CM | POA: Diagnosis not present

## 2019-01-28 DIAGNOSIS — K7581 Nonalcoholic steatohepatitis (NASH): Secondary | ICD-10-CM | POA: Diagnosis not present

## 2019-01-28 DIAGNOSIS — Z9049 Acquired absence of other specified parts of digestive tract: Secondary | ICD-10-CM | POA: Diagnosis not present

## 2019-01-28 DIAGNOSIS — E119 Type 2 diabetes mellitus without complications: Secondary | ICD-10-CM | POA: Diagnosis not present

## 2019-01-28 DIAGNOSIS — M79672 Pain in left foot: Secondary | ICD-10-CM

## 2019-01-28 DIAGNOSIS — Z6841 Body Mass Index (BMI) 40.0 and over, adult: Secondary | ICD-10-CM | POA: Diagnosis not present

## 2019-01-28 DIAGNOSIS — E785 Hyperlipidemia, unspecified: Secondary | ICD-10-CM | POA: Diagnosis not present

## 2019-01-28 DIAGNOSIS — K219 Gastro-esophageal reflux disease without esophagitis: Secondary | ICD-10-CM | POA: Diagnosis not present

## 2019-01-28 DIAGNOSIS — I110 Hypertensive heart disease with heart failure: Secondary | ICD-10-CM | POA: Diagnosis not present

## 2019-01-28 DIAGNOSIS — E669 Obesity, unspecified: Secondary | ICD-10-CM | POA: Diagnosis not present

## 2019-01-28 DIAGNOSIS — Z9181 History of falling: Secondary | ICD-10-CM | POA: Diagnosis not present

## 2019-01-28 DIAGNOSIS — H9193 Unspecified hearing loss, bilateral: Secondary | ICD-10-CM | POA: Diagnosis not present

## 2019-01-28 DIAGNOSIS — E039 Hypothyroidism, unspecified: Secondary | ICD-10-CM | POA: Diagnosis not present

## 2019-01-28 DIAGNOSIS — G4733 Obstructive sleep apnea (adult) (pediatric): Secondary | ICD-10-CM | POA: Diagnosis not present

## 2019-01-28 NOTE — Patient Instructions (Signed)
I think you have plantar fasciitis of left foot. Treat with diclofenac (volatern) gel 2-3 times daily Stretching foot regularly (see exercises provided today).  Frozen water bottle massage may help.  Buy gel insert heel lift to place in shoes for support.  If no better with above, let me know for referral to foot doctor.   Plantar Fasciitis  Plantar fasciitis is a painful foot condition that affects the heel. It occurs when the band of tissue that connects the toes to the heel bone (plantar fascia) becomes irritated. This can happen as the result of exercising too much or doing other repetitive activities (overuse injury). The pain from plantar fasciitis can range from mild irritation to severe pain that makes it difficult to walk or move. The pain is usually worse in the morning after sleeping, or after sitting or lying down for a while. Pain may also be worse after long periods of walking or standing. What are the causes? This condition may be caused by:  Standing for long periods of time.  Wearing shoes that do not have good arch support.  Doing activities that put stress on joints (high-impact activities), including running, aerobics, and ballet.  Being overweight.  An abnormal way of walking (gait).  Tight muscles in the back of your lower leg (calf).  High arches in your feet.  Starting a new athletic activity. What are the signs or symptoms? The main symptom of this condition is heel pain. Pain may:  Be worse with first steps after a time of rest, especially in the morning after sleeping or after you have been sitting or lying down for a while.  Be worse after long periods of standing still.  Decrease after 30-45 minutes of activity, such as gentle walking. How is this diagnosed? This condition may be diagnosed based on your medical history and your symptoms. Your health care provider may ask questions about your activity level. Your health care provider will do a  physical exam to check for:  A tender area on the bottom of your foot.  A high arch in your foot.  Pain when you move your foot.  Difficulty moving your foot. You may have imaging tests to confirm the diagnosis, such as:  X-rays.  Ultrasound.  MRI. How is this treated? Treatment for plantar fasciitis depends on how severe your condition is. Treatment may include:  Rest, ice, applying pressure (compression), and raising the affected foot (elevation). This may be called RICE therapy. Your health care provider may recommend RICE therapy along with over-the-counter pain medicines to manage your pain.  Exercises to stretch your calves and your plantar fascia.  A splint that holds your foot in a stretched, upward position while you sleep (night splint).  Physical therapy to relieve symptoms and prevent problems in the future.  Injections of steroid medicine (cortisone) to relieve pain and inflammation.  Stimulating your plantar fascia with electrical impulses (extracorporeal shock wave therapy). This is usually the last treatment option before surgery.  Surgery, if other treatments have not worked after 12 months. Follow these instructions at home:  Managing pain, stiffness, and swelling  If directed, put ice on the painful area: ? Put ice in a plastic bag, or use a frozen bottle of water. ? Place a towel between your skin and the bag or bottle. ? Roll the bottom of your foot over the bag or bottle. ? Do this for 20 minutes, 2-3 times a day.  Wear athletic shoes that have air-sole or  gel-sole cushions, or try wearing soft shoe inserts that are designed for plantar fasciitis.  Raise (elevate) your foot above the level of your heart while you are sitting or lying down. Activity  Avoid activities that cause pain. Ask your health care provider what activities are safe for you.  Do physical therapy exercises and stretches as told by your health care provider.  Try activities  and forms of exercise that are easier on your joints (low-impact). Examples include swimming, water aerobics, and biking. General instructions  Take over-the-counter and prescription medicines only as told by your health care provider.  Wear a night splint while sleeping, if told by your health care provider. Loosen the splint if your toes tingle, become numb, or turn cold and blue.  Maintain a healthy weight, or work with your health care provider to lose weight as needed.  Keep all follow-up visits as told by your health care provider. This is important. Contact a health care provider if you:  Have symptoms that do not go away after caring for yourself at home.  Have pain that gets worse.  Have pain that affects your ability to move or do your daily activities. Summary  Plantar fasciitis is a painful foot condition that affects the heel. It occurs when the band of tissue that connects the toes to the heel bone (plantar fascia) becomes irritated.  The main symptom of this condition is heel pain that may be worse after exercising too much or standing still for a long time.  Treatment varies, but it usually starts with rest, ice, compression, and elevation (RICE therapy) and over-the-counter medicines to manage pain. This information is not intended to replace advice given to you by your health care provider. Make sure you discuss any questions you have with your health care provider. Document Released: 11/02/2000 Document Revised: 01/20/2017 Document Reviewed: 12/05/2016 Elsevier Patient Education  2020 [employer].

## 2019-01-28 NOTE — Assessment & Plan Note (Signed)
Anticipate plantar fasciitis. Reviewed with patient etiology as well as treatment recommendations as per below. Update if not improving with this for podiatry referral. Pt and wife agree with plan.

## 2019-01-28 NOTE — Progress Notes (Signed)
This visit was conducted in person.  BP 116/62 (BP Location: Left Arm, Patient Position: Sitting, Cuff Size: Large)   Pulse 83   Temp 97.9 F (36.6 C) (Temporal)   Ht 5' 10"  (1.778 m)   Wt (!) 308 lb 2 oz (139.8 kg)   SpO2 97%   BMI 44.21 kg/m    CC: L foot pain Subjective:    Patient ID: Chad [name], male    DOB: [birth year], 63 y.o.   MRN: [id number]  HPI: Chad [name] is a 63 y.o. male presenting on 01/28/2019 for Foot Pain (C/o left foot pain.  Describes pain as soreness on the bottom raditing up to top of foot. Has also had some swelling.  Started 01/24/19.  Tried Biofreeze cream, barely helpfu. Pt accompanied by wife, Freddy Jaksch, 98.3].)   Swelling of L sole with pain radiating to dorsal foot, started 4d ago. No redness or fever. Tried biofreeze cream without significant benefit.  No R foot pain.   No known h/o gout.   Upcoming further evaluation of chronic dyspnea though microvascular ischemia related - overnight pulse ox, echo, possible ABG.      Relevant past medical, surgical, family and social history reviewed and updated as indicated. Interim medical history since our last visit reviewed. Allergies and medications reviewed and updated. Outpatient Medications Prior to Visit  Medication Sig Dispense Refill  . amLODipine (NORVASC) 5 MG tablet Take 1 tablet (5 mg total) by mouth daily.    Marland Kitchen aspirin 81 MG chewable tablet Chew 81 mg by mouth daily.     Marland Kitchen atorvastatin (LIPITOR) 20 MG tablet Take 1 tablet (20 mg total) by mouth daily.    . bumetanide (BUMEX) 2 MG tablet Take 1 tablet (2 mg total) by mouth 2 (two) times daily as needed (weight gain/leg swelling).    . citalopram (CELEXA) 20 MG tablet TAKE 1 TABLET BY MOUTH ONCE DAILY 90 tablet 0  . clopidogrel (PLAVIX) 75 MG tablet Take 1 tablet (75 mg total) by mouth daily.    . clotrimazole (LOTRIMIN) 1 % cream Apply 1 application topically 2 (two) times daily. 60 g 0  . clotrimazole-betamethasone (LOTRISONE) cream  Apply 1 application topically 2 (two) times daily. 30 g 0  . dapagliflozin propanediol (FARXIGA) 5 MG TABS tablet Take 5 mg by mouth daily before breakfast.    . DEXILANT 60 MG capsule TAKE 1 CAPSULE BY MOUTH ONCE DAILY 30 capsule 11  . diazepam (VALIUM) 5 MG tablet Take 1 tablet (5 mg total) by mouth every 12 (twelve) hours as needed for anxiety. 30 tablet 1  . diclofenac sodium (VOLTAREN) 1 % GEL Apply 1 application topically 3 (three) times daily. 1 Tube 1  . Docusate Sodium (DSS) 100 MG CAPS 1 tab 2 times a day while on narcotics.  STOOL SOFTENER 60 each 0  . famotidine (PEPCID) 20 MG tablet TAKE 1 TABLET BY MOUTH EVERY NIGHT AT BEDTIME 90 tablet 1  . ferrous sulfate 325 (65 FE) MG EC tablet Take 1 tablet (325 mg total) by mouth every other day.    . fluticasone (FLONASE) 50 MCG/ACT nasal spray PLACE 2 SPRAYS INTO BOTH NOSTRILS DAILY 16 g 2  . Insulin Pen Needle (ULTICARE MINI PEN NEEDLES) 31G X 6 MM MISC USE AS DIRECTED FOR LANTUS SOLOSTAR PEN 100 each 3  . isosorbide mononitrate (IMDUR) 60 MG 24 hr tablet TAKE 3 TABLETS BY MOUTH ONCE DAILY 90 tablet 5  . LANTUS SOLOSTAR 100 UNIT/ML Solostar  Pen INJECT 50 UNITS INTO THE SKIN TWICE DAILY 30 mL 5  . levothyroxine (SYNTHROID) 125 MCG tablet TAKE 1 TAB BY MOUTH ONCE DAILY. TAKE ON AN EMPTY STOMACH WITH A GLASSOF WATER ATLEAST 30-60 MINUTES BEFORE BREAKFAST 30 tablet 5  . lisinopril (PRINIVIL,ZESTRIL) 20 MG tablet Take 1 tablet (20 mg total) by mouth daily. 90 tablet 3  . magnesium oxide (MAG-OX) 400 MG tablet Take 1 tablet by mouth 2 (two) times daily.    . metFORMIN (GLUCOPHAGE) 1000 MG tablet TAKE 1 TABLET BY MOUTH TWICE A DAY WITH A MEAL 180 tablet 1  . metoCLOPramide (REGLAN) 10 MG tablet TAKE 1 TABLET BY MOUTH 3 TIMES DAILY BEFORE MEALS 90 tablet 0  . metolazone (ZAROXOLYN) 2.5 MG tablet Take 1 tablet (2.5 mg total) by mouth daily as needed (for more than 3lb/day or 5lb/wk weight gain).    . metoprolol succinate (TOPROL-XL) 50 MG 24 hr tablet  TAKE 1 TABLET BY MOUTH ONCE DAILY 90 tablet 1  . Multiple Vitamin (MULITIVITAMIN WITH MINERALS) TABS Take 1 tablet by mouth daily.    . nitroGLYCERIN (NITROSTAT) 0.4 MG SL tablet Place 1 tablet (0.4 mg total) under the tongue every 5 (five) minutes as needed for chest pain. 60 tablet 3  . polyethylene glycol (MIRALAX / GLYCOLAX) packet 17grams in 16 oz of water twice a day until bowel movement.  LAXITIVE.  Restart if two days since last bowel movement (Patient taking differently: 17grams in 16 oz of water once daily  LAXITIVE.  Restart if two days since last bowel movement) 14 each 0  . potassium chloride (K-DUR,KLOR-CON) 10 MEQ tablet Take 10 mEq by mouth 2 (two) times daily.    . Semaglutide,0.25 or 0.5MG/DOS, (OZEMPIC, 0.25 OR 0.5 MG/DOSE,) 2 MG/1.5ML SOPN Inject 0.5 mg into the skin once a week. 1 pen 6  . spironolactone (ALDACTONE) 25 MG tablet Take 1 tablet by mouth daily.    . sucralfate (CARAFATE) 1 g tablet TAKE 1 TABLET BY MOUTH TWICE (2) DAILY BEFORE A MEAL 60 tablet 1  . VENTOLIN HFA 108 (90 Base) MCG/ACT inhaler INHALE 2 PUFFS INTO THE LUNGS EVERY 6 HOURS AS NEEDED FOR WHEEZING OR SHORTNESS OF BREATH 18 g 5  . vitamin E 400 UNIT capsule Take 400 Units by mouth daily.     No facility-administered medications prior to visit.      Per HPI unless specifically indicated in ROS section below Review of Systems Objective:    BP 116/62 (BP Location: Left Arm, Patient Position: Sitting, Cuff Size: Large)   Pulse 83   Temp 97.9 F (36.6 C) (Temporal)   Ht 5' 10"  (1.778 m)   Wt (!) 308 lb 2 oz (139.8 kg)   SpO2 97%   BMI 44.21 kg/m   Wt Readings from Last 3 Encounters:  01/28/19 (!) 308 lb 2 oz (139.8 kg)  01/15/19 (!) 301 lb (136.5 kg)  01/07/19 (!) 305 lb 6 oz (138.5 kg)    Physical Exam Vitals signs and nursing note reviewed.  Constitutional:      Appearance: He is obese. He is ill-appearing (chronic).  Musculoskeletal:     Right lower leg: No edema.     Left lower leg: No  edema.     Comments:  2+ DP bilaterally Point tender to palpation along L sole No significant pain with calcaneal squeeze, at achilles tendon, or at base of 5th MT. No pain at 1st MTPJ  Skin:    General: Skin is warm  and dry.     Findings: No erythema or rash.  Psychiatric:        Mood and Affect: Mood normal.       Assessment & Plan:  This visit occurred during the SARS-CoV-2 public health emergency.  Safety protocols were in place, including screening questions prior to the visit, additional usage of staff PPE, and extensive cleaning of exam room while observing appropriate contact time as indicated for disinfecting solutions.   Problem List Items Addressed This Visit    Acute foot pain, left    Anticipate plantar fasciitis. Reviewed with patient etiology as well as treatment recommendations as per below. Update if not improving with this for podiatry referral. Pt and wife agree with plan.           No orders of the defined types were placed in this encounter.  No orders of the defined types were placed in this encounter.  Patient instructions: I think you have plantar fasciitis of left foot. Treat with diclofenac (volatern) gel 2-3 times daily Stretching foot regularly (see exercises provided today).  Frozen water bottle massage may help.  Buy gel insert heel lift to place in shoes for support.  If no better with above, let me know for referral to foot doctor.   Follow up plan: Return if symptoms worsen or fail to improve.  [name], MD

## 2019-01-29 ENCOUNTER — Other Ambulatory Visit: Payer: Self-pay | Admitting: Family Medicine

## 2019-01-29 DIAGNOSIS — E1165 Type 2 diabetes mellitus with hyperglycemia: Secondary | ICD-10-CM | POA: Diagnosis not present

## 2019-01-29 DIAGNOSIS — I25110 Atherosclerotic heart disease of native coronary artery with unstable angina pectoris: Secondary | ICD-10-CM | POA: Diagnosis not present

## 2019-01-29 DIAGNOSIS — Z7982 Long term (current) use of aspirin: Secondary | ICD-10-CM | POA: Diagnosis not present

## 2019-01-29 DIAGNOSIS — I5032 Chronic diastolic (congestive) heart failure: Secondary | ICD-10-CM | POA: Diagnosis not present

## 2019-01-29 DIAGNOSIS — G8929 Other chronic pain: Secondary | ICD-10-CM | POA: Diagnosis not present

## 2019-01-29 DIAGNOSIS — E119 Type 2 diabetes mellitus without complications: Secondary | ICD-10-CM | POA: Diagnosis not present

## 2019-01-29 DIAGNOSIS — E785 Hyperlipidemia, unspecified: Secondary | ICD-10-CM | POA: Diagnosis not present

## 2019-01-29 DIAGNOSIS — Z7984 Long term (current) use of oral hypoglycemic drugs: Secondary | ICD-10-CM | POA: Diagnosis not present

## 2019-01-29 DIAGNOSIS — Z9181 History of falling: Secondary | ICD-10-CM | POA: Diagnosis not present

## 2019-01-29 DIAGNOSIS — Z794 Long term (current) use of insulin: Secondary | ICD-10-CM | POA: Diagnosis not present

## 2019-01-29 DIAGNOSIS — R55 Syncope and collapse: Secondary | ICD-10-CM | POA: Diagnosis not present

## 2019-01-29 DIAGNOSIS — I25118 Atherosclerotic heart disease of native coronary artery with other forms of angina pectoris: Secondary | ICD-10-CM | POA: Diagnosis not present

## 2019-01-29 DIAGNOSIS — I10 Essential (primary) hypertension: Secondary | ICD-10-CM | POA: Diagnosis not present

## 2019-01-29 DIAGNOSIS — I110 Hypertensive heart disease with heart failure: Secondary | ICD-10-CM | POA: Diagnosis not present

## 2019-01-29 DIAGNOSIS — R079 Chest pain, unspecified: Secondary | ICD-10-CM | POA: Diagnosis not present

## 2019-01-29 DIAGNOSIS — Z79899 Other long term (current) drug therapy: Secondary | ICD-10-CM | POA: Diagnosis not present

## 2019-01-29 DIAGNOSIS — I2510 Atherosclerotic heart disease of native coronary artery without angina pectoris: Secondary | ICD-10-CM | POA: Diagnosis not present

## 2019-01-29 DIAGNOSIS — R0600 Dyspnea, unspecified: Secondary | ICD-10-CM | POA: Diagnosis not present

## 2019-01-29 DIAGNOSIS — F329 Major depressive disorder, single episode, unspecified: Secondary | ICD-10-CM | POA: Diagnosis not present

## 2019-01-29 DIAGNOSIS — G4733 Obstructive sleep apnea (adult) (pediatric): Secondary | ICD-10-CM | POA: Diagnosis not present

## 2019-01-29 DIAGNOSIS — I5030 Unspecified diastolic (congestive) heart failure: Secondary | ICD-10-CM | POA: Diagnosis not present

## 2019-01-29 DIAGNOSIS — R0609 Other forms of dyspnea: Secondary | ICD-10-CM | POA: Diagnosis not present

## 2019-01-30 ENCOUNTER — Telehealth: Payer: Self-pay | Admitting: Pulmonary Disease

## 2019-01-30 DIAGNOSIS — I5032 Chronic diastolic (congestive) heart failure: Secondary | ICD-10-CM | POA: Diagnosis not present

## 2019-01-30 DIAGNOSIS — I2510 Atherosclerotic heart disease of native coronary artery without angina pectoris: Secondary | ICD-10-CM | POA: Diagnosis not present

## 2019-01-30 DIAGNOSIS — G4733 Obstructive sleep apnea (adult) (pediatric): Secondary | ICD-10-CM | POA: Diagnosis not present

## 2019-01-30 DIAGNOSIS — E785 Hyperlipidemia, unspecified: Secondary | ICD-10-CM | POA: Diagnosis not present

## 2019-01-30 DIAGNOSIS — I110 Hypertensive heart disease with heart failure: Secondary | ICD-10-CM | POA: Diagnosis not present

## 2019-01-30 DIAGNOSIS — E119 Type 2 diabetes mellitus without complications: Secondary | ICD-10-CM | POA: Diagnosis not present

## 2019-01-30 NOTE — Telephone Encounter (Signed)
Per Suanne Marker, ONO order can not be sent without completed note.   LG, please advise once note has been completed so ONO can be sent. Thanks

## 2019-02-01 ENCOUNTER — Other Ambulatory Visit: Admission: RE | Admit: 2019-02-01 | Payer: Medicare Other | Source: Ambulatory Visit

## 2019-02-01 DIAGNOSIS — G4733 Obstructive sleep apnea (adult) (pediatric): Secondary | ICD-10-CM | POA: Diagnosis not present

## 2019-02-01 DIAGNOSIS — I110 Hypertensive heart disease with heart failure: Secondary | ICD-10-CM | POA: Diagnosis not present

## 2019-02-01 DIAGNOSIS — E119 Type 2 diabetes mellitus without complications: Secondary | ICD-10-CM | POA: Diagnosis not present

## 2019-02-01 DIAGNOSIS — E785 Hyperlipidemia, unspecified: Secondary | ICD-10-CM | POA: Diagnosis not present

## 2019-02-01 DIAGNOSIS — I2510 Atherosclerotic heart disease of native coronary artery without angina pectoris: Secondary | ICD-10-CM | POA: Diagnosis not present

## 2019-02-01 DIAGNOSIS — I5032 Chronic diastolic (congestive) heart failure: Secondary | ICD-10-CM | POA: Diagnosis not present

## 2019-02-04 ENCOUNTER — Other Ambulatory Visit: Payer: Self-pay

## 2019-02-04 ENCOUNTER — Ambulatory Visit
Admission: RE | Admit: 2019-02-04 | Discharge: 2019-02-04 | Disposition: A | Discharge status: Home / Self Care | Payer: Medicare Other | Source: Ambulatory Visit | Attending: Pulmonary Disease | Admitting: Pulmonary Disease

## 2019-02-04 ENCOUNTER — Ambulatory Visit: Payer: Medicare Other

## 2019-02-04 DIAGNOSIS — G4733 Obstructive sleep apnea (adult) (pediatric): Secondary | ICD-10-CM | POA: Diagnosis not present

## 2019-02-04 DIAGNOSIS — R0602 Shortness of breath: Secondary | ICD-10-CM

## 2019-02-04 DIAGNOSIS — I110 Hypertensive heart disease with heart failure: Secondary | ICD-10-CM | POA: Diagnosis not present

## 2019-02-04 DIAGNOSIS — E119 Type 2 diabetes mellitus without complications: Secondary | ICD-10-CM | POA: Diagnosis not present

## 2019-02-04 DIAGNOSIS — I5032 Chronic diastolic (congestive) heart failure: Secondary | ICD-10-CM | POA: Diagnosis not present

## 2019-02-04 DIAGNOSIS — E785 Hyperlipidemia, unspecified: Secondary | ICD-10-CM | POA: Diagnosis not present

## 2019-02-04 DIAGNOSIS — I2510 Atherosclerotic heart disease of native coronary artery without angina pectoris: Secondary | ICD-10-CM | POA: Diagnosis not present

## 2019-02-04 DIAGNOSIS — R0600 Dyspnea, unspecified: Secondary | ICD-10-CM | POA: Diagnosis present

## 2019-02-04 LAB — BLOOD GAS, ARTERIAL
Acid-Base Excess: 4.2 mmol/L — ABNORMAL HIGH (ref 0.0–2.0)
Bicarbonate: 27.3 mmol/L (ref 20.0–28.0)
FIO2: 0.21
O2 Saturation: 98.3 %
Patient temperature: 37
pCO2 arterial: 35 mmHg (ref 32.0–48.0)
pH, Arterial: 7.5 — ABNORMAL HIGH (ref 7.350–7.450)
pO2, Arterial: 101 mmHg (ref 83.0–108.0)

## 2019-02-04 NOTE — Progress Notes (Signed)
*  PRELIMINARY RESULTS* Echocardiogram 2D Echocardiogram has been performed.  Chad [name] Kamer 02/04/2019, 10:57 AM

## 2019-02-04 NOTE — Progress Notes (Signed)
I V started in lt forearm and bubble study done.Marland Kitchen

## 2019-02-05 ENCOUNTER — Telehealth: Payer: Self-pay

## 2019-02-05 DIAGNOSIS — E119 Type 2 diabetes mellitus without complications: Secondary | ICD-10-CM | POA: Diagnosis not present

## 2019-02-05 DIAGNOSIS — E785 Hyperlipidemia, unspecified: Secondary | ICD-10-CM | POA: Diagnosis not present

## 2019-02-05 DIAGNOSIS — I5032 Chronic diastolic (congestive) heart failure: Secondary | ICD-10-CM | POA: Diagnosis not present

## 2019-02-05 DIAGNOSIS — I2510 Atherosclerotic heart disease of native coronary artery without angina pectoris: Secondary | ICD-10-CM | POA: Diagnosis not present

## 2019-02-05 DIAGNOSIS — G4733 Obstructive sleep apnea (adult) (pediatric): Secondary | ICD-10-CM | POA: Diagnosis not present

## 2019-02-05 DIAGNOSIS — I110 Hypertensive heart disease with heart failure: Secondary | ICD-10-CM | POA: Diagnosis not present

## 2019-02-05 NOTE — Telephone Encounter (Signed)
Bailey PTA with [facility] left v/m that pt fell x 2 today; pt fell this morning onto couch with no injury. 2nd fall was this afternoon while Mel Almond was at pts home; pt was coming back from using the bathroom and fell into the recliner. Pt did not have injury and did not hit his head but said he was sore from beng pushed against the walker when fell. Pt said both times when he fell he was SOB. Pt has been seeing pulmonologist and trying to get pt started on O2. Pt has appt with pulmonology on 02/07/19. Mel Almond wanted Dr Darnell Level to be aware.

## 2019-02-05 NOTE — Telephone Encounter (Signed)
Noted  

## 2019-02-06 ENCOUNTER — Ambulatory Visit: Payer: Medicare Other | Admitting: Family Medicine

## 2019-02-07 ENCOUNTER — Encounter: Payer: Self-pay | Admitting: Pulmonary Disease

## 2019-02-07 ENCOUNTER — Ambulatory Visit: Payer: Medicare Other | Admitting: Pulmonary Disease
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
## 2019-02-07 NOTE — Telephone Encounter (Signed)
Dr. Patsey Berthold, please advise once note has been completed. Thanks

## 2019-02-07 NOTE — Telephone Encounter (Signed)
Please advise if this has been completed.  Thanks!

## 2019-02-08 ENCOUNTER — Other Ambulatory Visit: Payer: Self-pay

## 2019-02-08 NOTE — Telephone Encounter (Signed)
Spoke with patient and wife and they are aware that [employer] should be contacting to arrange ONO. Nothing else needed at this time. Rhonda J Cobb

## 2019-02-08 NOTE — Telephone Encounter (Signed)
Order sent to [employer] to arrange ONO.  Sent message as High Priority to [employer] to arrange ASAP since OV 01/15/2019. Rhonda J Cobb

## 2019-02-08 NOTE — Telephone Encounter (Signed)
Yes it was completed

## 2019-02-08 NOTE — Telephone Encounter (Signed)
Will route to [facility] to make aware. Will close encounter.

## 2019-02-12 DIAGNOSIS — E785 Hyperlipidemia, unspecified: Secondary | ICD-10-CM | POA: Diagnosis not present

## 2019-02-12 DIAGNOSIS — I2510 Atherosclerotic heart disease of native coronary artery without angina pectoris: Secondary | ICD-10-CM | POA: Diagnosis not present

## 2019-02-12 DIAGNOSIS — I5032 Chronic diastolic (congestive) heart failure: Secondary | ICD-10-CM | POA: Diagnosis not present

## 2019-02-12 DIAGNOSIS — I110 Hypertensive heart disease with heart failure: Secondary | ICD-10-CM | POA: Diagnosis not present

## 2019-02-12 DIAGNOSIS — E119 Type 2 diabetes mellitus without complications: Secondary | ICD-10-CM | POA: Diagnosis not present

## 2019-02-12 DIAGNOSIS — G4733 Obstructive sleep apnea (adult) (pediatric): Secondary | ICD-10-CM | POA: Diagnosis not present

## 2019-02-12 NOTE — Telephone Encounter (Signed)
I don't see where pt went to pulmonology appt scheduled last week - can we call for update and see if he has pulm appt planned?

## 2019-02-12 NOTE — Telephone Encounter (Signed)
Spoke with pt asking about pulm appt.  Says they called him and did phn appt.  Pt was told he may have some structural damage due to procedure.  However, pt explained his issues have been going on last 2 yrs.  So pt is scheduled for a pulse oximetry test while sleeping and a "breathing test" at the end of the month.  Fyi to Dr. Darnell Level.

## 2019-02-13 DIAGNOSIS — E785 Hyperlipidemia, unspecified: Secondary | ICD-10-CM | POA: Diagnosis not present

## 2019-02-13 DIAGNOSIS — E119 Type 2 diabetes mellitus without complications: Secondary | ICD-10-CM | POA: Diagnosis not present

## 2019-02-13 DIAGNOSIS — I110 Hypertensive heart disease with heart failure: Secondary | ICD-10-CM | POA: Diagnosis not present

## 2019-02-13 DIAGNOSIS — I2510 Atherosclerotic heart disease of native coronary artery without angina pectoris: Secondary | ICD-10-CM | POA: Diagnosis not present

## 2019-02-13 DIAGNOSIS — G4733 Obstructive sleep apnea (adult) (pediatric): Secondary | ICD-10-CM | POA: Diagnosis not present

## 2019-02-13 DIAGNOSIS — I5032 Chronic diastolic (congestive) heart failure: Secondary | ICD-10-CM | POA: Diagnosis not present

## 2019-02-14 DIAGNOSIS — I2510 Atherosclerotic heart disease of native coronary artery without angina pectoris: Secondary | ICD-10-CM

## 2019-02-14 DIAGNOSIS — E669 Obesity, unspecified: Secondary | ICD-10-CM

## 2019-02-14 DIAGNOSIS — E039 Hypothyroidism, unspecified: Secondary | ICD-10-CM

## 2019-02-14 DIAGNOSIS — G4733 Obstructive sleep apnea (adult) (pediatric): Secondary | ICD-10-CM

## 2019-02-14 DIAGNOSIS — Z6841 Body Mass Index (BMI) 40.0 and over, adult: Secondary | ICD-10-CM

## 2019-02-14 DIAGNOSIS — I5032 Chronic diastolic (congestive) heart failure: Secondary | ICD-10-CM

## 2019-02-14 DIAGNOSIS — E785 Hyperlipidemia, unspecified: Secondary | ICD-10-CM | POA: Diagnosis not present

## 2019-02-14 DIAGNOSIS — Z9181 History of falling: Secondary | ICD-10-CM

## 2019-02-14 DIAGNOSIS — H9193 Unspecified hearing loss, bilateral: Secondary | ICD-10-CM

## 2019-02-14 DIAGNOSIS — E119 Type 2 diabetes mellitus without complications: Secondary | ICD-10-CM

## 2019-02-14 DIAGNOSIS — K219 Gastro-esophageal reflux disease without esophagitis: Secondary | ICD-10-CM

## 2019-02-14 DIAGNOSIS — Z9049 Acquired absence of other specified parts of digestive tract: Secondary | ICD-10-CM

## 2019-02-14 DIAGNOSIS — I110 Hypertensive heart disease with heart failure: Secondary | ICD-10-CM

## 2019-02-14 DIAGNOSIS — Z794 Long term (current) use of insulin: Secondary | ICD-10-CM

## 2019-02-14 DIAGNOSIS — K7581 Nonalcoholic steatohepatitis (NASH): Secondary | ICD-10-CM

## 2019-02-18 ENCOUNTER — Telehealth: Payer: Self-pay | Admitting: Pulmonary Disease

## 2019-02-18 NOTE — Telephone Encounter (Addendum)
Lm to relay date/time of covid test. 02/18/2019 prior to [facility]  

## 2019-02-19 DIAGNOSIS — E119 Type 2 diabetes mellitus without complications: Secondary | ICD-10-CM | POA: Diagnosis not present

## 2019-02-19 DIAGNOSIS — E785 Hyperlipidemia, unspecified: Secondary | ICD-10-CM | POA: Diagnosis not present

## 2019-02-19 DIAGNOSIS — G4733 Obstructive sleep apnea (adult) (pediatric): Secondary | ICD-10-CM | POA: Diagnosis not present

## 2019-02-19 DIAGNOSIS — I110 Hypertensive heart disease with heart failure: Secondary | ICD-10-CM | POA: Diagnosis not present

## 2019-02-19 DIAGNOSIS — I2510 Atherosclerotic heart disease of native coronary artery without angina pectoris: Secondary | ICD-10-CM | POA: Diagnosis not present

## 2019-02-19 DIAGNOSIS — I5032 Chronic diastolic (congestive) heart failure: Secondary | ICD-10-CM | POA: Diagnosis not present

## 2019-02-19 NOTE — Telephone Encounter (Signed)
Pt is aware of date/time of covid test.   

## 2019-02-20 ENCOUNTER — Other Ambulatory Visit
Admission: RE | Admit: 2019-02-20 | Discharge: 2019-02-20 | Disposition: A | Discharge status: Home / Self Care | Payer: Medicare Other | Source: Ambulatory Visit | Attending: Pulmonary Disease | Admitting: Pulmonary Disease

## 2019-02-20 ENCOUNTER — Other Ambulatory Visit: Payer: Self-pay

## 2019-02-20 DIAGNOSIS — Z01812 Encounter for preprocedural laboratory examination: Secondary | ICD-10-CM | POA: Diagnosis present

## 2019-02-20 DIAGNOSIS — Z20828 Contact with and (suspected) exposure to other viral communicable diseases: Secondary | ICD-10-CM | POA: Diagnosis not present

## 2019-02-20 LAB — SARS CORONAVIRUS 2 (TAT 6-24 HRS): SARS Coronavirus 2: NEGATIVE

## 2019-02-21 ENCOUNTER — Ambulatory Visit: Payer: Medicare Other | Attending: Pulmonary Disease

## 2019-02-21 ENCOUNTER — Other Ambulatory Visit: Payer: Self-pay

## 2019-02-21 DIAGNOSIS — J984 Other disorders of lung: Secondary | ICD-10-CM | POA: Insufficient documentation

## 2019-02-21 DIAGNOSIS — I5032 Chronic diastolic (congestive) heart failure: Secondary | ICD-10-CM | POA: Diagnosis not present

## 2019-02-21 DIAGNOSIS — R0602 Shortness of breath: Secondary | ICD-10-CM | POA: Diagnosis not present

## 2019-02-21 DIAGNOSIS — E785 Hyperlipidemia, unspecified: Secondary | ICD-10-CM | POA: Diagnosis not present

## 2019-02-21 DIAGNOSIS — I110 Hypertensive heart disease with heart failure: Secondary | ICD-10-CM | POA: Diagnosis not present

## 2019-02-21 DIAGNOSIS — G4733 Obstructive sleep apnea (adult) (pediatric): Secondary | ICD-10-CM | POA: Diagnosis not present

## 2019-02-21 DIAGNOSIS — I2510 Atherosclerotic heart disease of native coronary artery without angina pectoris: Secondary | ICD-10-CM | POA: Diagnosis not present

## 2019-02-21 DIAGNOSIS — E119 Type 2 diabetes mellitus without complications: Secondary | ICD-10-CM | POA: Diagnosis not present

## 2019-02-21 MED ORDER — ALBUTEROL SULFATE (2.5 MG/3ML) 0.083% IN NEBU
2.5000 mg | INHALATION_SOLUTION | Freq: Once | RESPIRATORY_TRACT | Status: AC
  Administered 2019-02-21: 13:00:00 2.5 mg via RESPIRATORY_TRACT
  Filled 2019-02-21: qty 3

## 2019-02-26 DIAGNOSIS — E785 Hyperlipidemia, unspecified: Secondary | ICD-10-CM | POA: Diagnosis not present

## 2019-02-26 DIAGNOSIS — I2510 Atherosclerotic heart disease of native coronary artery without angina pectoris: Secondary | ICD-10-CM | POA: Diagnosis not present

## 2019-02-26 DIAGNOSIS — G4733 Obstructive sleep apnea (adult) (pediatric): Secondary | ICD-10-CM | POA: Diagnosis not present

## 2019-02-26 DIAGNOSIS — E119 Type 2 diabetes mellitus without complications: Secondary | ICD-10-CM | POA: Diagnosis not present

## 2019-02-26 DIAGNOSIS — I110 Hypertensive heart disease with heart failure: Secondary | ICD-10-CM | POA: Diagnosis not present

## 2019-02-26 DIAGNOSIS — I5032 Chronic diastolic (congestive) heart failure: Secondary | ICD-10-CM | POA: Diagnosis not present

## 2019-02-27 DIAGNOSIS — G4733 Obstructive sleep apnea (adult) (pediatric): Secondary | ICD-10-CM | POA: Diagnosis not present

## 2019-02-27 DIAGNOSIS — E119 Type 2 diabetes mellitus without complications: Secondary | ICD-10-CM | POA: Diagnosis not present

## 2019-02-27 DIAGNOSIS — K7581 Nonalcoholic steatohepatitis (NASH): Secondary | ICD-10-CM | POA: Diagnosis not present

## 2019-02-27 DIAGNOSIS — Z9181 History of falling: Secondary | ICD-10-CM | POA: Diagnosis not present

## 2019-02-27 DIAGNOSIS — K219 Gastro-esophageal reflux disease without esophagitis: Secondary | ICD-10-CM | POA: Diagnosis not present

## 2019-02-27 DIAGNOSIS — I2510 Atherosclerotic heart disease of native coronary artery without angina pectoris: Secondary | ICD-10-CM | POA: Diagnosis not present

## 2019-02-27 DIAGNOSIS — Z9049 Acquired absence of other specified parts of digestive tract: Secondary | ICD-10-CM | POA: Diagnosis not present

## 2019-02-27 DIAGNOSIS — E785 Hyperlipidemia, unspecified: Secondary | ICD-10-CM | POA: Diagnosis not present

## 2019-02-27 DIAGNOSIS — I110 Hypertensive heart disease with heart failure: Secondary | ICD-10-CM | POA: Diagnosis not present

## 2019-02-27 DIAGNOSIS — E669 Obesity, unspecified: Secondary | ICD-10-CM | POA: Diagnosis not present

## 2019-02-27 DIAGNOSIS — Z6841 Body Mass Index (BMI) 40.0 and over, adult: Secondary | ICD-10-CM | POA: Diagnosis not present

## 2019-02-27 DIAGNOSIS — H9193 Unspecified hearing loss, bilateral: Secondary | ICD-10-CM | POA: Diagnosis not present

## 2019-02-27 DIAGNOSIS — E039 Hypothyroidism, unspecified: Secondary | ICD-10-CM | POA: Diagnosis not present

## 2019-02-27 DIAGNOSIS — Z794 Long term (current) use of insulin: Secondary | ICD-10-CM | POA: Diagnosis not present

## 2019-02-27 DIAGNOSIS — I5032 Chronic diastolic (congestive) heart failure: Secondary | ICD-10-CM | POA: Diagnosis not present

## 2019-03-01 DIAGNOSIS — I5032 Chronic diastolic (congestive) heart failure: Secondary | ICD-10-CM | POA: Diagnosis not present

## 2019-03-01 DIAGNOSIS — G4733 Obstructive sleep apnea (adult) (pediatric): Secondary | ICD-10-CM | POA: Diagnosis not present

## 2019-03-01 DIAGNOSIS — K3184 Gastroparesis: Secondary | ICD-10-CM | POA: Diagnosis not present

## 2019-03-01 DIAGNOSIS — E039 Hypothyroidism, unspecified: Secondary | ICD-10-CM | POA: Diagnosis not present

## 2019-03-01 DIAGNOSIS — Z6841 Body Mass Index (BMI) 40.0 and over, adult: Secondary | ICD-10-CM | POA: Diagnosis not present

## 2019-03-01 DIAGNOSIS — E1165 Type 2 diabetes mellitus with hyperglycemia: Secondary | ICD-10-CM | POA: Diagnosis not present

## 2019-03-01 DIAGNOSIS — F4321 Adjustment disorder with depressed mood: Secondary | ICD-10-CM | POA: Diagnosis not present

## 2019-03-01 DIAGNOSIS — E611 Iron deficiency: Secondary | ICD-10-CM | POA: Diagnosis not present

## 2019-03-01 DIAGNOSIS — H9390 Unspecified disorder of ear, unspecified ear: Secondary | ICD-10-CM | POA: Diagnosis not present

## 2019-03-01 DIAGNOSIS — I25118 Atherosclerotic heart disease of native coronary artery with other forms of angina pectoris: Secondary | ICD-10-CM | POA: Diagnosis not present

## 2019-03-01 DIAGNOSIS — Z794 Long term (current) use of insulin: Secondary | ICD-10-CM | POA: Diagnosis not present

## 2019-03-01 DIAGNOSIS — E1143 Type 2 diabetes mellitus with diabetic autonomic (poly)neuropathy: Secondary | ICD-10-CM | POA: Diagnosis not present

## 2019-03-05 DIAGNOSIS — M4802 Spinal stenosis, cervical region: Secondary | ICD-10-CM | POA: Diagnosis not present

## 2019-03-05 DIAGNOSIS — E785 Hyperlipidemia, unspecified: Secondary | ICD-10-CM | POA: Diagnosis not present

## 2019-03-05 DIAGNOSIS — I2510 Atherosclerotic heart disease of native coronary artery without angina pectoris: Secondary | ICD-10-CM | POA: Diagnosis not present

## 2019-03-05 DIAGNOSIS — I5032 Chronic diastolic (congestive) heart failure: Secondary | ICD-10-CM | POA: Diagnosis not present

## 2019-03-05 DIAGNOSIS — E119 Type 2 diabetes mellitus without complications: Secondary | ICD-10-CM | POA: Diagnosis not present

## 2019-03-05 DIAGNOSIS — M4322 Fusion of spine, cervical region: Secondary | ICD-10-CM | POA: Diagnosis not present

## 2019-03-05 DIAGNOSIS — I110 Hypertensive heart disease with heart failure: Secondary | ICD-10-CM | POA: Diagnosis not present

## 2019-03-05 DIAGNOSIS — G4733 Obstructive sleep apnea (adult) (pediatric): Secondary | ICD-10-CM | POA: Diagnosis not present

## 2019-03-07 DIAGNOSIS — H61892 Other specified disorders of left external ear: Secondary | ICD-10-CM | POA: Diagnosis not present

## 2019-03-07 DIAGNOSIS — L570 Actinic keratosis: Secondary | ICD-10-CM | POA: Diagnosis not present

## 2019-03-07 DIAGNOSIS — L820 Inflamed seborrheic keratosis: Secondary | ICD-10-CM | POA: Diagnosis not present

## 2019-03-07 DIAGNOSIS — L821 Other seborrheic keratosis: Secondary | ICD-10-CM | POA: Diagnosis not present

## 2019-03-07 DIAGNOSIS — Z85828 Personal history of other malignant neoplasm of skin: Secondary | ICD-10-CM | POA: Diagnosis not present

## 2019-03-07 DIAGNOSIS — H61891 Other specified disorders of right external ear: Secondary | ICD-10-CM | POA: Diagnosis not present

## 2019-03-08 ENCOUNTER — Telehealth: Payer: Self-pay | Admitting: Pulmonary Disease

## 2019-03-08 DIAGNOSIS — E662 Morbid (severe) obesity with alveolar hypoventilation: Secondary | ICD-10-CM

## 2019-03-08 NOTE — Telephone Encounter (Signed)
Should make follow up appt. In 6 -8 wks

## 2019-03-08 NOTE — Telephone Encounter (Signed)
Pt is aware of below results and voiced his understanding.  Pt has been scheduled for ROV 04/18/2019 at 2:00. Nothing further is needed.

## 2019-03-08 NOTE — Telephone Encounter (Signed)
Lm for pt

## 2019-03-08 NOTE — Telephone Encounter (Signed)
Please see below message and advise. Thanks!

## 2019-03-08 NOTE — Telephone Encounter (Signed)
His PFTs show that he does not have COPD or asthma. He has restriction likely due to increased weight and also as a result from nerve impingement due to back issues.

## 2019-03-08 NOTE — Telephone Encounter (Signed)
Pt has a diagnosis of OSA, and will be unable to get 02 unless he privately pays for it or has an in lab sleep study.  Insurance will not cover 02 unless pt has a Sleep Study with CPAP and 02 bled in.   I understand that patient does not wear CPAP and turned it in, however, the treatment for OSA is CPAP per insurance.   Please advise. Rhonda J Cobb

## 2019-03-08 NOTE — Telephone Encounter (Signed)
ONO reviewed by Dr. Patsey Berthold- recommend 2L QHS. Pt is aware of results and is agreeable to oxygen. Order has been placed.  Pt is also requesting PFT results.   Dr. Patsey Berthold please advise. Thanks

## 2019-03-09 NOTE — Telephone Encounter (Signed)
We may need to schedule him with Dr. Halford Chessman for sleep. His symptoms are related to severe nocturnal hypoxemia. He had issues tolerating CPAP and may need AVAPS or BiPAP. He will likely need split night sleep study IN LAB.

## 2019-03-11 NOTE — Telephone Encounter (Signed)
Spoke to and and relayed below recommendations.  Pt stated that he was not going through that and requested that we just forget it and ended the call.   Will route to Dr. Patsey Berthold to make aware.

## 2019-03-12 NOTE — Telephone Encounter (Signed)
We will not be able to help him then.I will be glad to refer him to [facility] or [facility] pulm if he desires.  CLG

## 2019-03-12 NOTE — Telephone Encounter (Signed)
Called and spoke to pt and relayed below message.  Pt stated that he is currently seeing [facility] and has HST pending. Pt stated that referral is not needed. Nothing further is needed.

## 2019-03-14 DIAGNOSIS — E785 Hyperlipidemia, unspecified: Secondary | ICD-10-CM | POA: Diagnosis not present

## 2019-03-14 DIAGNOSIS — I5032 Chronic diastolic (congestive) heart failure: Secondary | ICD-10-CM | POA: Diagnosis not present

## 2019-03-14 DIAGNOSIS — G4733 Obstructive sleep apnea (adult) (pediatric): Secondary | ICD-10-CM | POA: Diagnosis not present

## 2019-03-14 DIAGNOSIS — I2510 Atherosclerotic heart disease of native coronary artery without angina pectoris: Secondary | ICD-10-CM | POA: Diagnosis not present

## 2019-03-14 DIAGNOSIS — E119 Type 2 diabetes mellitus without complications: Secondary | ICD-10-CM | POA: Diagnosis not present

## 2019-03-14 DIAGNOSIS — I110 Hypertensive heart disease with heart failure: Secondary | ICD-10-CM | POA: Diagnosis not present

## 2019-03-19 DIAGNOSIS — I2510 Atherosclerotic heart disease of native coronary artery without angina pectoris: Secondary | ICD-10-CM | POA: Diagnosis not present

## 2019-03-19 DIAGNOSIS — I5032 Chronic diastolic (congestive) heart failure: Secondary | ICD-10-CM | POA: Diagnosis not present

## 2019-03-19 DIAGNOSIS — I110 Hypertensive heart disease with heart failure: Secondary | ICD-10-CM | POA: Diagnosis not present

## 2019-03-19 DIAGNOSIS — G4733 Obstructive sleep apnea (adult) (pediatric): Secondary | ICD-10-CM | POA: Diagnosis not present

## 2019-03-19 DIAGNOSIS — E785 Hyperlipidemia, unspecified: Secondary | ICD-10-CM | POA: Diagnosis not present

## 2019-03-19 DIAGNOSIS — E119 Type 2 diabetes mellitus without complications: Secondary | ICD-10-CM | POA: Diagnosis not present

## 2019-03-22 DIAGNOSIS — Z9181 History of falling: Secondary | ICD-10-CM | POA: Diagnosis not present

## 2019-03-22 DIAGNOSIS — G4733 Obstructive sleep apnea (adult) (pediatric): Secondary | ICD-10-CM | POA: Diagnosis not present

## 2019-03-22 DIAGNOSIS — Z23 Encounter for immunization: Secondary | ICD-10-CM | POA: Diagnosis not present

## 2019-03-22 DIAGNOSIS — I5032 Chronic diastolic (congestive) heart failure: Secondary | ICD-10-CM | POA: Diagnosis not present

## 2019-03-22 DIAGNOSIS — I10 Essential (primary) hypertension: Secondary | ICD-10-CM | POA: Diagnosis not present

## 2019-03-26 ENCOUNTER — Other Ambulatory Visit: Payer: Self-pay | Admitting: Family Medicine

## 2019-03-26 DIAGNOSIS — I110 Hypertensive heart disease with heart failure: Secondary | ICD-10-CM | POA: Diagnosis not present

## 2019-03-26 DIAGNOSIS — I2510 Atherosclerotic heart disease of native coronary artery without angina pectoris: Secondary | ICD-10-CM | POA: Diagnosis not present

## 2019-03-26 DIAGNOSIS — E785 Hyperlipidemia, unspecified: Secondary | ICD-10-CM | POA: Diagnosis not present

## 2019-03-26 DIAGNOSIS — I5032 Chronic diastolic (congestive) heart failure: Secondary | ICD-10-CM | POA: Diagnosis not present

## 2019-03-26 DIAGNOSIS — G4733 Obstructive sleep apnea (adult) (pediatric): Secondary | ICD-10-CM | POA: Diagnosis not present

## 2019-03-26 DIAGNOSIS — E119 Type 2 diabetes mellitus without complications: Secondary | ICD-10-CM | POA: Diagnosis not present

## 2019-03-28 ENCOUNTER — Other Ambulatory Visit: Payer: Self-pay

## 2019-03-28 MED ORDER — ULTICARE MINI PEN NEEDLES 31G X 6 MM MISC: 0 refills | Status: DC

## 2019-03-29 ENCOUNTER — Ambulatory Visit: Payer: Medicare Other | Admitting: Family Medicine

## 2019-03-29 DIAGNOSIS — I2510 Atherosclerotic heart disease of native coronary artery without angina pectoris: Secondary | ICD-10-CM | POA: Diagnosis not present

## 2019-04-17 DIAGNOSIS — I2510 Atherosclerotic heart disease of native coronary artery without angina pectoris: Secondary | ICD-10-CM | POA: Diagnosis not present

## 2019-04-17 DIAGNOSIS — E039 Hypothyroidism, unspecified: Secondary | ICD-10-CM | POA: Diagnosis not present

## 2019-04-17 DIAGNOSIS — I5032 Chronic diastolic (congestive) heart failure: Secondary | ICD-10-CM | POA: Diagnosis not present

## 2019-04-17 DIAGNOSIS — I110 Hypertensive heart disease with heart failure: Secondary | ICD-10-CM | POA: Diagnosis not present

## 2019-04-17 DIAGNOSIS — Z6841 Body Mass Index (BMI) 40.0 and over, adult: Secondary | ICD-10-CM | POA: Diagnosis not present

## 2019-04-17 DIAGNOSIS — K7581 Nonalcoholic steatohepatitis (NASH): Secondary | ICD-10-CM | POA: Diagnosis not present

## 2019-04-17 DIAGNOSIS — K219 Gastro-esophageal reflux disease without esophagitis: Secondary | ICD-10-CM | POA: Diagnosis not present

## 2019-04-17 DIAGNOSIS — E785 Hyperlipidemia, unspecified: Secondary | ICD-10-CM | POA: Diagnosis not present

## 2019-04-17 DIAGNOSIS — G4733 Obstructive sleep apnea (adult) (pediatric): Secondary | ICD-10-CM | POA: Diagnosis not present

## 2019-04-17 DIAGNOSIS — Z9181 History of falling: Secondary | ICD-10-CM

## 2019-04-17 DIAGNOSIS — Z9049 Acquired absence of other specified parts of digestive tract: Secondary | ICD-10-CM

## 2019-04-17 DIAGNOSIS — E119 Type 2 diabetes mellitus without complications: Secondary | ICD-10-CM | POA: Diagnosis not present

## 2019-04-17 DIAGNOSIS — Z794 Long term (current) use of insulin: Secondary | ICD-10-CM

## 2019-04-17 DIAGNOSIS — E669 Obesity, unspecified: Secondary | ICD-10-CM | POA: Diagnosis not present

## 2019-04-17 DIAGNOSIS — H9193 Unspecified hearing loss, bilateral: Secondary | ICD-10-CM | POA: Diagnosis not present

## 2019-04-18 ENCOUNTER — Ambulatory Visit: Payer: Medicare Other | Admitting: Pulmonary Disease

## 2019-04-19 DIAGNOSIS — I10 Essential (primary) hypertension: Secondary | ICD-10-CM | POA: Diagnosis not present

## 2019-04-19 DIAGNOSIS — M25559 Pain in unspecified hip: Secondary | ICD-10-CM | POA: Diagnosis not present

## 2019-04-19 DIAGNOSIS — Z794 Long term (current) use of insulin: Secondary | ICD-10-CM | POA: Diagnosis not present

## 2019-04-19 DIAGNOSIS — E1165 Type 2 diabetes mellitus with hyperglycemia: Secondary | ICD-10-CM | POA: Diagnosis not present

## 2019-04-19 DIAGNOSIS — Z1159 Encounter for screening for other viral diseases: Secondary | ICD-10-CM | POA: Diagnosis not present

## 2019-04-19 DIAGNOSIS — M160 Bilateral primary osteoarthritis of hip: Secondary | ICD-10-CM | POA: Diagnosis not present

## 2019-05-10 ENCOUNTER — Telehealth: Payer: Self-pay

## 2019-05-10 ENCOUNTER — Other Ambulatory Visit: Payer: Self-pay

## 2019-05-10 DIAGNOSIS — K760 Fatty (change of) liver, not elsewhere classified: Secondary | ICD-10-CM

## 2019-05-10 NOTE — Telephone Encounter (Signed)
Pt called returning your call 

## 2019-05-10 NOTE — Telephone Encounter (Signed)
Left message for pt to call back  °

## 2019-05-10 NOTE — Telephone Encounter (Signed)
Pt scheduled for [place] of ABD at [facility] 05/15/19 @10am . Pt to arrive in xray at 9:45am and be NPO after midnight. Pts wife aware of appt.

## 2019-05-10 NOTE — Telephone Encounter (Signed)
-----   Message from Algernon Huxley, RN sent at 11/02/2018 12:51 PM EDT ----- Pt needs labs and [place]

## 2019-05-13 NOTE — Telephone Encounter (Signed)
Pt scheduled for [place] at [facility] 05/15/19@10am .

## 2019-05-15 ENCOUNTER — Ambulatory Visit (HOSPITAL_COMMUNITY)
Admission: RE | Admit: 2019-05-15 | Discharge: 2019-05-15 | Disposition: A | Discharge status: Home / Self Care | Payer: Medicare Other | Source: Ambulatory Visit | Attending: Internal Medicine | Admitting: Internal Medicine

## 2019-05-15 ENCOUNTER — Other Ambulatory Visit: Payer: Self-pay

## 2019-05-15 DIAGNOSIS — K760 Fatty (change of) liver, not elsewhere classified: Secondary | ICD-10-CM | POA: Insufficient documentation

## 2019-05-15 DIAGNOSIS — Z45018 Encounter for adjustment and management of other part of cardiac pacemaker: Secondary | ICD-10-CM | POA: Diagnosis not present

## 2019-05-15 DIAGNOSIS — I5032 Chronic diastolic (congestive) heart failure: Secondary | ICD-10-CM | POA: Diagnosis not present

## 2019-05-31 ENCOUNTER — Other Ambulatory Visit: Payer: Self-pay

## 2019-05-31 DIAGNOSIS — M71552 Other bursitis, not elsewhere classified, left hip: Secondary | ICD-10-CM | POA: Diagnosis not present

## 2019-05-31 DIAGNOSIS — M71551 Other bursitis, not elsewhere classified, right hip: Secondary | ICD-10-CM | POA: Diagnosis not present

## 2019-05-31 MED ORDER — ULTICARE MINI PEN NEEDLES 31G X 6 MM MISC: 3 refills | Status: DC

## 2019-05-31 NOTE — Telephone Encounter (Signed)
E-scribed refill

## 2019-06-11 DIAGNOSIS — Z981 Arthrodesis status: Secondary | ICD-10-CM | POA: Diagnosis not present

## 2019-06-11 DIAGNOSIS — M50321 Other cervical disc degeneration at C4-C5 level: Secondary | ICD-10-CM | POA: Diagnosis not present

## 2019-06-11 DIAGNOSIS — M4802 Spinal stenosis, cervical region: Secondary | ICD-10-CM | POA: Diagnosis not present

## 2019-06-17 DIAGNOSIS — H43813 Vitreous degeneration, bilateral: Secondary | ICD-10-CM | POA: Diagnosis not present

## 2019-06-17 DIAGNOSIS — Z961 Presence of intraocular lens: Secondary | ICD-10-CM | POA: Diagnosis not present

## 2019-06-17 DIAGNOSIS — Z794 Long term (current) use of insulin: Secondary | ICD-10-CM | POA: Diagnosis not present

## 2019-06-17 DIAGNOSIS — H31012 Macula scars of posterior pole (postinflammatory) (post-traumatic), left eye: Secondary | ICD-10-CM | POA: Diagnosis not present

## 2019-06-17 DIAGNOSIS — H3589 Other specified retinal disorders: Secondary | ICD-10-CM | POA: Diagnosis not present

## 2019-06-17 DIAGNOSIS — E11319 Type 2 diabetes mellitus with unspecified diabetic retinopathy without macular edema: Secondary | ICD-10-CM | POA: Diagnosis not present

## 2019-07-01 ENCOUNTER — Other Ambulatory Visit: Payer: Self-pay | Admitting: Family Medicine

## 2019-07-02 DIAGNOSIS — R079 Chest pain, unspecified: Secondary | ICD-10-CM | POA: Diagnosis not present

## 2019-07-04 ENCOUNTER — Other Ambulatory Visit: Payer: Self-pay | Admitting: Family Medicine

## 2019-07-09 ENCOUNTER — Other Ambulatory Visit: Payer: Self-pay | Admitting: Family Medicine

## 2019-07-11 DIAGNOSIS — E119 Type 2 diabetes mellitus without complications: Secondary | ICD-10-CM | POA: Diagnosis not present

## 2019-07-11 DIAGNOSIS — R079 Chest pain, unspecified: Secondary | ICD-10-CM | POA: Diagnosis not present

## 2019-07-11 DIAGNOSIS — I2510 Atherosclerotic heart disease of native coronary artery without angina pectoris: Secondary | ICD-10-CM | POA: Diagnosis not present

## 2019-07-11 DIAGNOSIS — E785 Hyperlipidemia, unspecified: Secondary | ICD-10-CM | POA: Diagnosis not present

## 2019-07-11 DIAGNOSIS — I10 Essential (primary) hypertension: Secondary | ICD-10-CM | POA: Diagnosis not present

## 2019-07-12 DIAGNOSIS — Z125 Encounter for screening for malignant neoplasm of prostate: Secondary | ICD-10-CM | POA: Diagnosis not present

## 2019-07-12 DIAGNOSIS — E1165 Type 2 diabetes mellitus with hyperglycemia: Secondary | ICD-10-CM | POA: Diagnosis not present

## 2019-07-12 DIAGNOSIS — Z794 Long term (current) use of insulin: Secondary | ICD-10-CM | POA: Diagnosis not present

## 2019-07-12 DIAGNOSIS — F4321 Adjustment disorder with depressed mood: Secondary | ICD-10-CM | POA: Diagnosis not present

## 2019-07-12 DIAGNOSIS — R634 Abnormal weight loss: Secondary | ICD-10-CM | POA: Diagnosis not present

## 2019-08-21 ENCOUNTER — Emergency Department
Admission: EM | Admit: 2019-08-21 | Discharge: 2019-08-21 | Disposition: A | Discharge status: Home / Self Care | Payer: Medicare Other | Attending: Emergency Medicine | Admitting: Emergency Medicine

## 2019-08-21 ENCOUNTER — Other Ambulatory Visit: Payer: Self-pay

## 2019-08-21 ENCOUNTER — Emergency Department: Payer: Medicare Other

## 2019-08-21 ENCOUNTER — Encounter: Payer: Self-pay | Admitting: Emergency Medicine

## 2019-08-21 DIAGNOSIS — I2510 Atherosclerotic heart disease of native coronary artery without angina pectoris: Secondary | ICD-10-CM | POA: Diagnosis not present

## 2019-08-21 DIAGNOSIS — Z7982 Long term (current) use of aspirin: Secondary | ICD-10-CM | POA: Insufficient documentation

## 2019-08-21 DIAGNOSIS — Y92838 Other recreation area as the place of occurrence of the external cause: Secondary | ICD-10-CM | POA: Insufficient documentation

## 2019-08-21 DIAGNOSIS — Z794 Long term (current) use of insulin: Secondary | ICD-10-CM | POA: Diagnosis not present

## 2019-08-21 DIAGNOSIS — J45909 Unspecified asthma, uncomplicated: Secondary | ICD-10-CM | POA: Insufficient documentation

## 2019-08-21 DIAGNOSIS — S20211A Contusion of right front wall of thorax, initial encounter: Secondary | ICD-10-CM | POA: Diagnosis not present

## 2019-08-21 DIAGNOSIS — E039 Hypothyroidism, unspecified: Secondary | ICD-10-CM | POA: Insufficient documentation

## 2019-08-21 DIAGNOSIS — W010XXA Fall on same level from slipping, tripping and stumbling without subsequent striking against object, initial encounter: Secondary | ICD-10-CM | POA: Diagnosis not present

## 2019-08-21 DIAGNOSIS — Z79899 Other long term (current) drug therapy: Secondary | ICD-10-CM | POA: Diagnosis not present

## 2019-08-21 DIAGNOSIS — E1140 Type 2 diabetes mellitus with diabetic neuropathy, unspecified: Secondary | ICD-10-CM | POA: Insufficient documentation

## 2019-08-21 DIAGNOSIS — I5032 Chronic diastolic (congestive) heart failure: Secondary | ICD-10-CM | POA: Insufficient documentation

## 2019-08-21 DIAGNOSIS — I110 Hypertensive heart disease with heart failure: Secondary | ICD-10-CM | POA: Insufficient documentation

## 2019-08-21 DIAGNOSIS — Y999 Unspecified external cause status: Secondary | ICD-10-CM | POA: Insufficient documentation

## 2019-08-21 DIAGNOSIS — Y9319 Activity, other involving water and watercraft: Secondary | ICD-10-CM | POA: Insufficient documentation

## 2019-08-21 DIAGNOSIS — Z7901 Long term (current) use of anticoagulants: Secondary | ICD-10-CM | POA: Insufficient documentation

## 2019-08-21 DIAGNOSIS — S20301A Unspecified superficial injuries of right front wall of thorax, initial encounter: Secondary | ICD-10-CM | POA: Diagnosis present

## 2019-08-21 MED ORDER — HYDROCODONE-ACETAMINOPHEN 5-325 MG PO TABS
1.0000 | ORAL_TABLET | Freq: Once | ORAL | Status: AC
  Administered 2019-08-21: 1 via ORAL
  Filled 2019-08-21: qty 1

## 2019-08-21 MED ORDER — HYDROCODONE-ACETAMINOPHEN 5-325 MG PO TABS: 1.0000 | ORAL_TABLET | Freq: Four times a day (QID) | ORAL | 0 refills | Status: DC | PRN

## 2019-08-21 MED ORDER — METHYLPREDNISOLONE 4 MG PO TBPK: ORAL_TABLET | ORAL | 0 refills | Status: DC

## 2019-08-21 NOTE — Discharge Instructions (Addendum)
Follow-up with your regular doctor if not improved in 3 to 4 days. Take the medication as prescribed The methylprednisolone will decrease the inflammation around her ribs to help decrease pain.  Take Vicodin for pain not controlled by this medication.  Return if worsening.
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
## 2019-08-21 NOTE — ED Notes (Signed)
See triage note  State he tripped and fell yesterday afternoon   States his right elbow went into right rib area   States he sneezed this am   Pain became worse

## 2019-08-21 NOTE — ED Triage Notes (Signed)
Patient states he was fishing yesterday when he tripped and fell and his right elbow went into his ribs. Complaining of pain to right side of ribs.

## 2019-10-02 ENCOUNTER — Encounter: Payer: Self-pay | Admitting: Family Medicine

## 2019-10-02 ENCOUNTER — Ambulatory Visit (INDEPENDENT_AMBULATORY_CARE_PROVIDER_SITE_OTHER): Payer: Medicare Other | Admitting: Family Medicine

## 2019-10-02 ENCOUNTER — Telehealth: Payer: Self-pay

## 2019-10-02 ENCOUNTER — Other Ambulatory Visit: Payer: Self-pay

## 2019-10-02 VITALS — BP 124/64 | HR 74 | Temp 97.8°F | Ht 69.0 in | Wt 264.2 lb

## 2019-10-02 DIAGNOSIS — K219 Gastro-esophageal reflux disease without esophagitis: Secondary | ICD-10-CM | POA: Diagnosis not present

## 2019-10-02 DIAGNOSIS — E1165 Type 2 diabetes mellitus with hyperglycemia: Secondary | ICD-10-CM

## 2019-10-02 DIAGNOSIS — R634 Abnormal weight loss: Secondary | ICD-10-CM | POA: Insufficient documentation

## 2019-10-02 DIAGNOSIS — R6881 Early satiety: Secondary | ICD-10-CM

## 2019-10-02 DIAGNOSIS — R197 Diarrhea, unspecified: Secondary | ICD-10-CM | POA: Diagnosis not present

## 2019-10-02 DIAGNOSIS — E039 Hypothyroidism, unspecified: Secondary | ICD-10-CM | POA: Diagnosis not present

## 2019-10-02 DIAGNOSIS — I5032 Chronic diastolic (congestive) heart failure: Secondary | ICD-10-CM

## 2019-10-02 DIAGNOSIS — E1143 Type 2 diabetes mellitus with diabetic autonomic (poly)neuropathy: Secondary | ICD-10-CM

## 2019-10-02 DIAGNOSIS — E11319 Type 2 diabetes mellitus with unspecified diabetic retinopathy without macular edema: Secondary | ICD-10-CM

## 2019-10-02 DIAGNOSIS — K3184 Gastroparesis: Secondary | ICD-10-CM

## 2019-10-02 DIAGNOSIS — E611 Iron deficiency: Secondary | ICD-10-CM

## 2019-10-02 DIAGNOSIS — K7460 Unspecified cirrhosis of liver: Secondary | ICD-10-CM | POA: Diagnosis not present

## 2019-10-02 DIAGNOSIS — IMO0002 Reserved for concepts with insufficient information to code with codable children: Secondary | ICD-10-CM

## 2019-10-02 DIAGNOSIS — Z9884 Bariatric surgery status: Secondary | ICD-10-CM | POA: Diagnosis not present

## 2019-10-02 DIAGNOSIS — K7581 Nonalcoholic steatohepatitis (NASH): Secondary | ICD-10-CM | POA: Diagnosis not present

## 2019-10-02 LAB — COMPREHENSIVE METABOLIC PANEL
ALT: 28 U/L (ref 0–53)
AST: 41 U/L — ABNORMAL HIGH (ref 0–37)
Albumin: 4.5 g/dL (ref 3.5–5.2)
Alkaline Phosphatase: 194 U/L — ABNORMAL HIGH (ref 39–117)
BUN: 16 mg/dL (ref 6–23)
CO2: 32 mEq/L (ref 19–32)
Calcium: 9.7 mg/dL (ref 8.4–10.5)
Chloride: 97 mEq/L (ref 96–112)
Creatinine, Ser: 0.99 mg/dL (ref 0.40–1.50)
GFR: 75.96 mL/min (ref >60.00)
Glucose, Bld: 93 mg/dL (ref 70–99)
Potassium: 4.5 mEq/L (ref 3.5–5.1)
Sodium: 139 mEq/L (ref 135–145)
Total Bilirubin: 1.5 mg/dL — ABNORMAL HIGH (ref 0.2–1.2)
Total Protein: 6.8 g/dL (ref 6.0–8.3)

## 2019-10-02 LAB — IBC PANEL
Iron: 98 ug/dL (ref 42–165)
Saturation Ratios: 22.7 % (ref 20.0–50.0)
Transferrin: 309 mg/dL (ref 212.0–360.0)

## 2019-10-02 LAB — CBC WITH DIFFERENTIAL/PLATELET
Basophils Absolute: 0 10*3/uL (ref 0.0–0.1)
Basophils Relative: 0.2 % (ref 0.0–3.0)
Eosinophils Absolute: 0.1 10*3/uL (ref 0.0–0.7)
Eosinophils Relative: 1.6 % (ref 0.0–5.0)
HCT: 44.5 % (ref 39.0–52.0)
Hemoglobin: 14.5 g/dL (ref 13.0–17.0)
Lymphocytes Relative: 12.6 % (ref 12.0–46.0)
Lymphs Abs: 0.8 10*3/uL (ref 0.7–4.0)
MCHC: 32.5 g/dL (ref 30.0–36.0)
MCV: 81.6 fl (ref 78.0–100.0)
Monocytes Absolute: 0.6 10*3/uL (ref 0.1–1.0)
Monocytes Relative: 9 % (ref 3.0–12.0)
Neutro Abs: 5 10*3/uL (ref 1.4–7.7)
Neutrophils Relative %: 76.6 % (ref 43.0–77.0)
Platelets: 167 10*3/uL (ref 150.0–400.0)
RBC: 5.45 Mil/uL (ref 4.22–5.81)
RDW: 15.7 % — ABNORMAL HIGH (ref 11.5–15.5)
WBC: 6.5 10*3/uL (ref 4.0–10.5)

## 2019-10-02 LAB — POCT GLYCOSYLATED HEMOGLOBIN (HGB A1C): Hemoglobin A1C: 5.1 % (ref 4.0–5.6)

## 2019-10-02 LAB — PROTIME-INR
INR: 1.1 ratio — ABNORMAL HIGH (ref 0.8–1.0)
Prothrombin Time: 12.3 s (ref 9.6–13.1)

## 2019-10-02 LAB — TSH: TSH: 0.38 u[IU]/mL (ref 0.35–4.50)

## 2019-10-02 LAB — FERRITIN: Ferritin: 12.2 ng/mL — ABNORMAL LOW (ref 22.0–322.0)

## 2019-10-02 NOTE — Assessment & Plan Note (Signed)
44 lb weight loss since last seen in office. See below.

## 2019-10-02 NOTE — Patient Instructions (Addendum)
Pass by the lab to pick up stool tests.  If normal, we may discuss dropping diabetes medicine doses.  We will order CT of abdomen and pelvis as well as barium swallow.  Keep GI appointment.  Return in 6 weeks for follow up visit.

## 2019-10-02 NOTE — Assessment & Plan Note (Signed)
See above for planned w/u. May need to lower metformin dose.

## 2019-10-02 NOTE — Assessment & Plan Note (Addendum)
Chronic, marked improvement on current regimen - he has decreased lantus down to 24u BID (from prior 50u BID dose) likely due to weight loss. See below. A1c today 5.1% however denies hypoglycemia - discussed likely need to drop diabetes regimen pending labs and workup today.

## 2019-10-02 NOTE — Assessment & Plan Note (Addendum)
Overall stable period. Appreciate cards care, followed by [facility] program.

## 2019-10-02 NOTE — Telephone Encounter (Signed)
Thank you. Labs from today should suffice.

## 2019-10-02 NOTE — Assessment & Plan Note (Signed)
Update TSH on levothyroxine 163mg daily.

## 2019-10-02 NOTE — Telephone Encounter (Signed)
Patient is scheduled for CT scan on 10/10/19. He will needs labs placed prior to this. Dr. Darnell Level, please advise.

## 2019-10-02 NOTE — Progress Notes (Signed)
This visit was conducted in person.  BP 124/64 (BP Location: Left Arm, Patient Position: Sitting, Cuff Size: Large)   Pulse 74   Temp 97.8 F (36.6 C) (Temporal)   Ht 5' 9"  (1.753 m)   Wt 264 lb 3 oz (119.8 kg)   SpO2 97%   BMI 39.01 kg/m    CC: DM f/u visit, discuss loss of appetite Subjective:    Patient ID: Chad [name], male    DOB: [birth year], 64 y.o.   MRN: [id number]  HPI: Chad [name] is a 64 y.o. male presenting on 10/02/2019 for Diabetes (Here for f/u.  Pt accompanied by wife, [name] Pulse- temp 97.5) and Anorexia (C/o loss of appetite for last 10 mos.  Concerned about wt loss. )   Transferred care to [facility] family practice earlier this year, decided to return care here. He and wife are concerned about ongoing anorexia over the last 10 months with associated unintentional weight loss. Prior PCP workup included CBC, PSA, CMP. 44 lb weight loss since last seen here (01/2019).   Some ongoing mid abdominal pain associated with indigestion, loose stools. Stopping miralax has helped loose stools but they continue. Upcoming [facility] appt 8/31. Stools are brown, not pale or yellow or greasy - are more seedy. No blood in the stool. Having 3-4 soft stools a day. No watery diarrhea. Early satiety. No dysphagia.  Stable chronic dyspnea in known heart disease.  No fevers/chills, cough, chest pain, nausea, vomiting, constipation.  Known cirrhosis last abd imaging was abd [place] 10/2018.   DM - does regularly check sugars 80-90 fasting. Compliant with antihyperglycemic regimen which includes: farxiga 71m daily, lantus 24u bid, metformin 10059mbid and ozempic 0.61m63meekly. Denies low sugars or hypoglycemic symptoms. Last diabetic eye exam 05/2019. Pneumovax: 2008, 2013. Prevnar: not due. Glucometer brand: unsure. DSME: completed remotely. Lab Results  Component Value Date   HGBA1C 5.1 10/02/2019   Diabetic Foot Exam - Simple   No data filed     Lab Results  Component Value Date   MICROALBUR <0.7  09/15/2015    COLONOSCOPY Date: 12/2014 TAs, mod diverticulosis, rpt 3 yrs (Pyrtle) COLONOSCOPY 08/2015 - HP, diverticulosis (Pyrtle)     Relevant past medical, surgical, family and social history reviewed and updated as indicated. Interim medical history since our last visit reviewed. Allergies and medications reviewed and updated. Outpatient Medications Prior to Visit  Medication Sig Dispense Refill  . amLODipine (NORVASC) 5 MG tablet Take 1 tablet (5 mg total) by mouth daily.    . aMarland Kitchenpirin 81 MG chewable tablet Chew 81 mg by mouth daily.     . aMarland Kitchenorvastatin (LIPITOR) 20 MG tablet Take 1 tablet (20 mg total) by mouth daily.    . bumetanide (BUMEX) 2 MG tablet Take 1 tablet (2 mg total) by mouth 2 (two) times daily as needed (weight gain/leg swelling).    . citalopram (CELEXA) 20 MG tablet TAKE 1 TABLET BY MOUTH ONCE DAILY 90 tablet 1  . clopidogrel (PLAVIX) 75 MG tablet Take 1 tablet (75 mg total) by mouth daily.    . clotrimazole (LOTRIMIN) 1 % cream Apply 1 application topically 2 (two) times daily. 60 g 0  . clotrimazole-betamethasone (LOTRISONE) cream Apply 1 application topically 2 (two) times daily. 30 g 0  . dapagliflozin propanediol (FARXIGA) 5 MG TABS tablet Take 5 mg by mouth daily before breakfast.    . DEXILANT 60 MG capsule TAKE 1 CAPSULE BY MOUTH ONCE DAILY 30 capsule 11  .  diazepam (VALIUM) 5 MG tablet Take 1 tablet (5 mg total) by mouth every 12 (twelve) hours as needed for anxiety. 30 tablet 1  . Docusate Sodium (DSS) 100 MG CAPS 1 tab 2 times a day while on narcotics.  STOOL SOFTENER 60 each 0  . famotidine (PEPCID) 20 MG tablet TAKE 1 TABLET BY MOUTH EVERY NIGHT AT BEDTIME 90 tablet 1  . ferrous sulfate 325 (65 FE) MG EC tablet Take 1 tablet (325 mg total) by mouth every other day.    . fluticasone (FLONASE) 50 MCG/ACT nasal spray PLACE 2 SPRAYS INTO BOTH NOSTRILS DAILY 16 g 2  . HYDROcodone-acetaminophen (NORCO/VICODIN) 5-325 MG tablet Take 1 tablet by mouth every 6 (six)  hours as needed for moderate pain. 15 tablet 0  . insulin glargine (LANTUS SOLOSTAR) 100 UNIT/ML Solostar Pen Inject 24 Units into the skin 2 (two) times daily.    . Insulin Pen Needle (ULTICARE MINI PEN NEEDLES) 31G X 6 MM MISC Need follow up scheduled in the next 1 to 2 months USE AS DIRECTED FOR LANTUS SOLOSTAR PEN 100 each 3  . isosorbide mononitrate (IMDUR) 60 MG 24 hr tablet TAKE 3 TABLETS BY MOUTH ONCE DAILY 90 tablet 5  . levothyroxine (SYNTHROID) 125 MCG tablet TAKE 1 TAB BY MOUTH ONCE DAILY. TAKE ON AN EMPTY STOMACH WITH A GLASS OF WATER ATLEAST 30-60 MINUTES BEFORE BREAKFAST 30 tablet 5  . lisinopril (PRINIVIL,ZESTRIL) 20 MG tablet Take 1 tablet (20 mg total) by mouth daily. 90 tablet 3  . magnesium oxide (MAG-OX) 400 MG tablet Take 1 tablet by mouth 2 (two) times daily.    . metFORMIN (GLUCOPHAGE) 1000 MG tablet TAKE 1 TABLET BY MOUTH TWICE A DAY WITH A MEAL 180 tablet 2  . methylPREDNISolone (MEDROL DOSEPAK) 4 MG TBPK tablet Take 6 pills on day one then decrease by 1 pill each day 21 tablet 0  . metoCLOPramide (REGLAN) 10 MG tablet TAKE 1 TABLET BY MOUTH 3 TIMES DAILY BEFORE MEALS 90 tablet 0  . metolazone (ZAROXOLYN) 2.5 MG tablet Take 1 tablet (2.5 mg total) by mouth daily as needed (for more than 3lb/day or 5lb/wk weight gain).    . metoprolol succinate (TOPROL-XL) 50 MG 24 hr tablet TAKE 1 TABLET BY MOUTH ONCE DAILY 90 tablet 1  . Multiple Vitamin (MULITIVITAMIN WITH MINERALS) TABS Take 1 tablet by mouth daily.    . nitroGLYCERIN (NITROSTAT) 0.4 MG SL tablet Place 1 tablet (0.4 mg total) under the tongue every 5 (five) minutes as needed for chest pain. 60 tablet 3  . OZEMPIC, 0.25 OR 0.5 MG/DOSE, 2 MG/1.5ML SOPN INJECT 0.5 MG INTO THE SKIN ONCE A WEEK AS DIRECTED 1.5 mL 1  . polyethylene glycol (MIRALAX / GLYCOLAX) packet 17grams in 16 oz of water twice a day until bowel movement.  LAXITIVE.  Restart if two days since last bowel movement (Patient taking differently: 17grams in 16 oz  of water once daily  LAXITIVE.  Restart if two days since last bowel movement) 14 each 0  . potassium chloride (K-DUR,KLOR-CON) 10 MEQ tablet Take 10 mEq by mouth 2 (two) times daily.    Marland Kitchen spironolactone (ALDACTONE) 25 MG tablet Take 1 tablet by mouth daily.    . sucralfate (CARAFATE) 1 g tablet TAKE 1 TABLET BY MOUTH TWICE (2) DAILY BEFORE A MEAL 60 tablet 1  . VENTOLIN HFA 108 (90 Base) MCG/ACT inhaler INHALE 2 PUFFS INTO THE LUNGS EVERY 6 HOURS AS NEEDED FOR WHEEZING OR SHORTNESS OF BREATH  18 g 5  . vitamin E 400 UNIT capsule Take 400 Units by mouth daily.    Marland Kitchen LANTUS SOLOSTAR 100 UNIT/ML Solostar Pen INJECT 50 UNITS INTO THE SKIN TWICE DAILY 30 mL 5   No facility-administered medications prior to visit.     Per HPI unless specifically indicated in ROS section below Review of Systems Objective:  BP 124/64 (BP Location: Left Arm, Patient Position: Sitting, Cuff Size: Large)   Pulse 74   Temp 97.8 F (36.6 C) (Temporal)   Ht 5' 9"  (1.753 m)   Wt 264 lb 3 oz (119.8 kg)   SpO2 97%   BMI 39.01 kg/m   Wt Readings from Last 3 Encounters:  10/02/19 264 lb 3 oz (119.8 kg)  08/21/19 268 lb (121.6 kg)  01/28/19 (!) 308 lb 2 oz (139.8 kg)      Physical Exam Vitals and nursing note reviewed.  Constitutional:      Appearance: Normal appearance. He is obese. He is not ill-appearing.  Cardiovascular:     Rate and Rhythm: Normal rate and regular rhythm.     Pulses: Normal pulses.     Heart sounds: Normal heart sounds. No murmur heard.   Pulmonary:     Effort: Pulmonary effort is normal. No respiratory distress.     Breath sounds: Normal breath sounds. No wheezing, rhonchi or rales.  Abdominal:     General: Bowel sounds are normal. There is no distension.     Palpations: Abdomen is soft. There is no mass.     Tenderness: There is no abdominal tenderness. There is no right CVA tenderness, left CVA tenderness, guarding or rebound.     Hernia: No hernia is present.  Musculoskeletal:      Right lower leg: No edema.     Left lower leg: No edema.  Skin:    General: Skin is warm and dry.     Findings: No rash.  Psychiatric:        Behavior: Behavior normal.       Results for orders placed or performed in visit on 10/02/19  POCT glycosylated hemoglobin (Hb A1C)  Result Value Ref Range   Hemoglobin A1C 5.1 4.0 - 5.6 %   HbA1c POC (<> result, manual entry)     HbA1c, POC (prediabetic range)     HbA1c, POC (controlled diabetic range)     Assessment & Plan:  This visit occurred during the SARS-CoV-2 public health emergency.  Safety protocols were in place, including screening questions prior to the visit, additional usage of staff PPE, and extensive cleaning of exam room while observing appropriate contact time as indicated for disinfecting solutions.   Problem List Items Addressed This Visit    Unintended weight loss - Primary    44 lb documented weight loss in the last 8 months, unintentional. Check fecal fat and fecal elastase to eval for malabsorption, iFOB, labs today. Check barium swallow with associated early satiety. Check CT abd/pelvis for further evaluation.       Relevant Orders   Fecal fat, qualitative   Pancreatic Elastase, Fecal   Fecal occult blood, imunochemical   DG ESOPHAGUS W DOUBLE CM (HD)   CT Abdomen Pelvis W Contrast   TSH   Comprehensive metabolic panel   CBC with Differential/Platelet   Type 2 diabetes mellitus, uncontrolled, with retinopathy (HCC)    Chronic, marked improvement on current regimen - he has decreased lantus down to 24u BID (from prior 50u BID dose) likely due to weight  loss. See below. A1c today 5.1% however denies hypoglycemia - discussed likely need to drop diabetes regimen pending labs and workup today.       Relevant Medications   insulin glargine (LANTUS SOLOSTAR) 100 UNIT/ML Solostar Pen   Other Relevant Orders   POCT glycosylated hemoglobin (Hb A1C) (Completed)   CT Abdomen Pelvis W Contrast   Obesity, Class III, BMI  40-49.9 (morbid obesity) (HCC) (Chronic)    44 lb weight loss since last seen in office. See below.       Relevant Medications   insulin glargine (LANTUS SOLOSTAR) 100 UNIT/ML Solostar Pen   Liver cirrhosis secondary to NASH (HCC) (Chronic)    Update labs.       Relevant Orders   Comprehensive metabolic panel   CBC with Differential/Platelet   Protime-INR   Iron deficiency    Continues oral iron 3d/wk. Update levels, consider stopping as anemia has resolved.       Relevant Orders   Ferritin   IBC panel   Hypothyroidism    Update TSH on levothyroxine 123mg daily.       GERD (gastroesophageal reflux disease)    Severe - continues dexilant 668mdaily. Anticipate improvement with weight loss.       Diarrhea    See above for planned w/u. May need to lower metformin dose.       Relevant Orders   Fecal fat, qualitative   Pancreatic Elastase, Fecal   Fecal occult blood, imunochemical   DG ESOPHAGUS W DOUBLE CM (HD)   CT Abdomen Pelvis W Contrast   Diabetic gastroparesis (HCMartin   Discussed possible GLP1 RA contributing to symptoms - he is also on reglan. Consider DC GLP1 RA      Relevant Medications   insulin glargine (LANTUS SOLOSTAR) 100 UNIT/ML Solostar Pen   Chronic diastolic CHF (congestive heart failure) (HCC)    Overall stable period. Appreciate cards care, followed by [facility] program.       Bariatric surgery status    Other Visit Diagnoses    Early satiety       Relevant Orders   DG ESOPHAGUS W DOUBLE CM (HD)   CT Abdomen Pelvis W Contrast       No orders of the defined types were placed in this encounter.  Orders Placed This Encounter  Procedures  . Fecal occult blood, imunochemical    Standing Status:   Future    Standing Expiration Date:   10/01/2020  . DG ESOPHAGUS W DOUBLE CM (HD)    Standing Status:   Future    Standing Expiration Date:   10/01/2020    Order Specific Question:   Reason for Exam (SYMPTOM  OR DIAGNOSIS REQUIRED)    Answer:    early satiety, weight loss    Order Specific Question:   Preferred imaging location?    Answer:   [facility]    Order Specific Question:   Radiology Contrast Protocol - do NOT remove file path    Answer:   \\[email or web address]  . CT Abdomen Pelvis W Contrast    Standing Status:   Future    Standing Expiration Date:   10/01/2020    Order Specific Question:   If indicated for the ordered procedure, I authorize the administration of contrast media per Radiology protocol    Answer:   Yes    Order Specific Question:   Preferred imaging location?    Answer:   [facility]    Order Specific Question:  Is Oral Contrast requested for this exam?    Answer:   Yes, Per Radiology protocol    Order Specific Question:   Radiology Contrast Protocol - do NOT remove file path    Answer:   \\[email or web address]\[email or web address]  . Fecal fat, qualitative  . Pancreatic Elastase, Fecal  . Ferritin  . IBC panel  . TSH  . Comprehensive metabolic panel  . CBC with Differential/Platelet  . Protime-INR  . POCT glycosylated hemoglobin (Hb A1C)    Patient Instructions  Pass by the lab to pick up stool tests.  If normal, we may discuss dropping diabetes medicine doses.  We will order CT of abdomen and pelvis as well as barium swallow.  Keep GI appointment.  Return in 6 weeks for follow up visit.    Follow up plan: Return in about 6 weeks (around 11/13/2019) for follow up visit.  [name], MD

## 2019-10-02 NOTE — Assessment & Plan Note (Addendum)
Discussed possible GLP1 RA contributing to symptoms - he is also on reglan. Consider DC GLP1 RA

## 2019-10-02 NOTE — Assessment & Plan Note (Signed)
Update labs.  

## 2019-10-02 NOTE — Assessment & Plan Note (Signed)
Continues oral iron 3d/wk. Update levels, consider stopping as anemia has resolved.

## 2019-10-02 NOTE — Assessment & Plan Note (Signed)
44 lb documented weight loss in the last 8 months, unintentional. Check fecal fat and fecal elastase to eval for malabsorption, iFOB, labs today. Check barium swallow with associated early satiety. Check CT abd/pelvis for further evaluation.

## 2019-10-02 NOTE — Assessment & Plan Note (Signed)
Severe - continues dexilant 20m daily. Anticipate improvement with weight loss.

## 2019-10-02 NOTE — Telephone Encounter (Signed)
[facility] - Client Nonclinical Telephone Record AccessNurse Client [place] Night - Client Client Site [place] - Night Contact Type Call Who Is Calling Patient / Member / Family / Caregiver Caller Name Conlin Brahm Phone Number [phone] Call Type Message Only Information Provided Reason for Call Returning a Call from the Office Initial [place] states he was returning a call. Disp. Time Disposition Final User 10/01/2019 5:22:21 PM General Information Provided Yes [name] Cables Call Closed By: [name] Cables Transaction Date/Time: 10/01/2019 5:19:46 PM (ET)

## 2019-10-03 ENCOUNTER — Other Ambulatory Visit: Payer: Medicare Other

## 2019-10-03 DIAGNOSIS — R197 Diarrhea, unspecified: Secondary | ICD-10-CM | POA: Diagnosis not present

## 2019-10-03 DIAGNOSIS — E1165 Type 2 diabetes mellitus with hyperglycemia: Secondary | ICD-10-CM | POA: Diagnosis not present

## 2019-10-03 DIAGNOSIS — K7460 Unspecified cirrhosis of liver: Secondary | ICD-10-CM

## 2019-10-03 DIAGNOSIS — I5032 Chronic diastolic (congestive) heart failure: Secondary | ICD-10-CM

## 2019-10-03 DIAGNOSIS — K7581 Nonalcoholic steatohepatitis (NASH): Secondary | ICD-10-CM | POA: Diagnosis not present

## 2019-10-03 DIAGNOSIS — R6881 Early satiety: Secondary | ICD-10-CM | POA: Diagnosis not present

## 2019-10-03 DIAGNOSIS — E1143 Type 2 diabetes mellitus with diabetic autonomic (poly)neuropathy: Secondary | ICD-10-CM

## 2019-10-03 DIAGNOSIS — E611 Iron deficiency: Secondary | ICD-10-CM

## 2019-10-03 DIAGNOSIS — K219 Gastro-esophageal reflux disease without esophagitis: Secondary | ICD-10-CM | POA: Diagnosis not present

## 2019-10-03 DIAGNOSIS — K3184 Gastroparesis: Secondary | ICD-10-CM

## 2019-10-03 DIAGNOSIS — Z9884 Bariatric surgery status: Secondary | ICD-10-CM

## 2019-10-03 DIAGNOSIS — R634 Abnormal weight loss: Secondary | ICD-10-CM | POA: Diagnosis not present

## 2019-10-03 DIAGNOSIS — E11319 Type 2 diabetes mellitus with unspecified diabetic retinopathy without macular edema: Secondary | ICD-10-CM | POA: Diagnosis not present

## 2019-10-03 DIAGNOSIS — E039 Hypothyroidism, unspecified: Secondary | ICD-10-CM

## 2019-10-03 DIAGNOSIS — IMO0002 Reserved for concepts with insufficient information to code with codable children: Secondary | ICD-10-CM

## 2019-10-04 ENCOUNTER — Other Ambulatory Visit (INDEPENDENT_AMBULATORY_CARE_PROVIDER_SITE_OTHER): Payer: Medicare Other

## 2019-10-04 DIAGNOSIS — R197 Diarrhea, unspecified: Secondary | ICD-10-CM | POA: Diagnosis not present

## 2019-10-04 DIAGNOSIS — R634 Abnormal weight loss: Secondary | ICD-10-CM | POA: Diagnosis not present

## 2019-10-04 LAB — FECAL OCCULT BLOOD, IMMUNOCHEMICAL: Fecal Occult Bld: NEGATIVE

## 2019-10-04 LAB — FECAL OCCULT BLOOD, GUAIAC: Fecal Occult Blood: NEGATIVE

## 2019-10-07 ENCOUNTER — Encounter: Payer: Self-pay | Admitting: Family Medicine

## 2019-10-09 LAB — PANCREATIC ELASTASE, FECAL: Pancreatic Elastase-1, Stool: >500 mcg/g

## 2019-10-09 LAB — FECAL FAT, QUALITATIVE: FECAL FAT, QUALITATIVE: NORMAL

## 2019-10-10 ENCOUNTER — Other Ambulatory Visit: Payer: Self-pay

## 2019-10-10 ENCOUNTER — Ambulatory Visit
Admission: RE | Admit: 2019-10-10 | Discharge: 2019-10-10 | Disposition: A | Discharge status: Home / Self Care | Payer: Medicare Other | Source: Ambulatory Visit | Attending: Family Medicine | Admitting: Family Medicine

## 2019-10-10 DIAGNOSIS — IMO0002 Reserved for concepts with insufficient information to code with codable children: Secondary | ICD-10-CM

## 2019-10-10 DIAGNOSIS — R634 Abnormal weight loss: Secondary | ICD-10-CM | POA: Insufficient documentation

## 2019-10-10 DIAGNOSIS — R197 Diarrhea, unspecified: Secondary | ICD-10-CM | POA: Diagnosis not present

## 2019-10-10 DIAGNOSIS — E11319 Type 2 diabetes mellitus with unspecified diabetic retinopathy without macular edema: Secondary | ICD-10-CM | POA: Diagnosis not present

## 2019-10-10 DIAGNOSIS — E1165 Type 2 diabetes mellitus with hyperglycemia: Secondary | ICD-10-CM | POA: Diagnosis not present

## 2019-10-10 DIAGNOSIS — K7689 Other specified diseases of liver: Secondary | ICD-10-CM | POA: Diagnosis not present

## 2019-10-10 DIAGNOSIS — K5730 Diverticulosis of large intestine without perforation or abscess without bleeding: Secondary | ICD-10-CM | POA: Diagnosis not present

## 2019-10-10 DIAGNOSIS — R6881 Early satiety: Secondary | ICD-10-CM | POA: Diagnosis not present

## 2019-10-10 DIAGNOSIS — D1779 Benign lipomatous neoplasm of other sites: Secondary | ICD-10-CM | POA: Diagnosis not present

## 2019-10-10 DIAGNOSIS — D7389 Other diseases of spleen: Secondary | ICD-10-CM | POA: Diagnosis not present

## 2019-10-10 MED ORDER — IOHEXOL 300 MG/ML  SOLN
100.0000 mL | Freq: Once | INTRAMUSCULAR | Status: AC | PRN
  Administered 2019-10-10: 100 mL via INTRAVENOUS

## 2019-10-11 ENCOUNTER — Other Ambulatory Visit: Payer: Self-pay | Admitting: Family Medicine

## 2019-10-11 ENCOUNTER — Ambulatory Visit
Admission: RE | Admit: 2019-10-11 | Discharge: 2019-10-11 | Disposition: A | Discharge status: Home / Self Care | Payer: Medicare Other | Source: Ambulatory Visit | Attending: Family Medicine | Admitting: Family Medicine

## 2019-10-11 ENCOUNTER — Telehealth: Payer: Self-pay

## 2019-10-11 DIAGNOSIS — R197 Diarrhea, unspecified: Secondary | ICD-10-CM | POA: Diagnosis not present

## 2019-10-11 DIAGNOSIS — R221 Localized swelling, mass and lump, neck: Secondary | ICD-10-CM

## 2019-10-11 DIAGNOSIS — R6881 Early satiety: Secondary | ICD-10-CM | POA: Diagnosis not present

## 2019-10-11 DIAGNOSIS — K222 Esophageal obstruction: Secondary | ICD-10-CM | POA: Diagnosis not present

## 2019-10-11 DIAGNOSIS — R634 Abnormal weight loss: Secondary | ICD-10-CM | POA: Diagnosis not present

## 2019-10-11 NOTE — Addendum Note (Signed)
Addended by: Tonia Ghent on: 10/11/2019 02:07 PM   Modules accepted: Orders

## 2019-10-11 NOTE — Telephone Encounter (Signed)
Call pt.  2 issues.    1. Lower cervical esophagus is displaced slightly to the right. A neck mass cannot be excluded and IV contrast-enhanced neck CT suggested for further evaluation.  I put in the order.  Thanks.   2. Narrowing of the distal esophagus at the gastroesophageal junction noted.  A stricture could present in this fashion. Endoscopic evaluation suggested.  I put in referral to GI.

## 2019-10-11 NOTE — Telephone Encounter (Signed)
Call report from [facility] for barium swallow:  Lower cervical esophagus is displaced slightly to the right. A left paraesophageal neck mass cannot be excluded and IV contrast-enhanced neck CT suggested for further evaluation.

## 2019-10-11 NOTE — Telephone Encounter (Signed)
Left message on vm per dpr relaying results of barium swallow.  Notified pt Dr. Damita Dunnings has ordered the CT and to expect a call to get it scheduled.

## 2019-10-13 ENCOUNTER — Encounter: Payer: Self-pay | Admitting: Family Medicine

## 2019-10-13 DIAGNOSIS — I700 Atherosclerosis of aorta: Secondary | ICD-10-CM | POA: Insufficient documentation

## 2019-10-13 NOTE — Telephone Encounter (Signed)
Noted. Agree with planned steps from now. Will await GI eval and neck CT.

## 2019-10-14 NOTE — Telephone Encounter (Signed)
[facility] Night - Client Nonclinical Telephone Record AccessNurse Client [place] Night - Client Client Site [place] Physician [name] - MD Contact Type Call Who Is Calling Patient / Member / Family / Caregiver Caller Name Clare Casto Phone Number [phone] Call Type Message Only Information Provided Reason for Call Returning a Call from the Office Initial [place] returning a call from the office. Lattie Haw Additional Comment Office hours were provided. Disp. Time Disposition Final User 10/14/2019 7:54:48 AM General Information Provided Yes Jaclyn Prime Call Closed By: Jaclyn Prime Transaction Date/Time: 10/14/2019 7:53:13 AM (ET)

## 2019-10-15 DIAGNOSIS — M47812 Spondylosis without myelopathy or radiculopathy, cervical region: Secondary | ICD-10-CM | POA: Diagnosis not present

## 2019-10-15 DIAGNOSIS — M4322 Fusion of spine, cervical region: Secondary | ICD-10-CM | POA: Diagnosis not present

## 2019-10-15 DIAGNOSIS — Z981 Arthrodesis status: Secondary | ICD-10-CM | POA: Diagnosis not present

## 2019-10-15 DIAGNOSIS — Z9889 Other specified postprocedural states: Secondary | ICD-10-CM | POA: Diagnosis not present

## 2019-10-22 ENCOUNTER — Encounter: Payer: Self-pay | Admitting: Physician Assistant

## 2019-10-22 ENCOUNTER — Ambulatory Visit (INDEPENDENT_AMBULATORY_CARE_PROVIDER_SITE_OTHER): Payer: Medicare Other | Admitting: Physician Assistant

## 2019-10-22 ENCOUNTER — Other Ambulatory Visit: Payer: Medicare Other

## 2019-10-22 VITALS — BP 110/60 | HR 81 | Ht 70.5 in | Wt 261.4 lb

## 2019-10-22 DIAGNOSIS — R630 Anorexia: Secondary | ICD-10-CM | POA: Diagnosis not present

## 2019-10-22 DIAGNOSIS — K7460 Unspecified cirrhosis of liver: Secondary | ICD-10-CM | POA: Diagnosis not present

## 2019-10-22 DIAGNOSIS — R197 Diarrhea, unspecified: Secondary | ICD-10-CM | POA: Diagnosis not present

## 2019-10-22 DIAGNOSIS — R933 Abnormal findings on diagnostic imaging of other parts of digestive tract: Secondary | ICD-10-CM

## 2019-10-22 DIAGNOSIS — R1030 Lower abdominal pain, unspecified: Secondary | ICD-10-CM | POA: Diagnosis not present

## 2019-10-22 DIAGNOSIS — R634 Abnormal weight loss: Secondary | ICD-10-CM

## 2019-10-22 DIAGNOSIS — K7581 Nonalcoholic steatohepatitis (NASH): Secondary | ICD-10-CM
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
## 2019-10-22 NOTE — Patient Instructions (Addendum)
If you are age 64 or older, your body mass index should be between 23-30. Your Body mass index is 36.97 kg/m. If this is out of the aforementioned range listed, please consider follow up with your Primary Care Provider.  If you are age 92 or younger, your body mass index should be between 19-25. Your Body mass index is 36.97 kg/m. If this is out of the aformentioned range listed, please consider follow up with your Primary Care Provider.   Stop Reglan (Metoclopramide).   Your provider has requested that you go to the basement level for lab work before leaving today. Press "B" on the elevator. The lab is located at the first door on the left as you exit the elevator.  You have been scheduled for an MRI at [facility] (1st floor radiology) on Wednesday 10/30/19. Your appointment time is 1 pm. Please arrive 15 minutes prior to your appointment time for registration purposes. Please make certain not to have anything to eat or drink 6 hours prior to your test. In addition, if you have any metal in your body, have a pacemaker or defibrillator, please be sure to let your ordering physician know. This test typically takes 45 minutes to 1 hour to complete. Should you need to reschedule, please call [phone] to do so.

## 2019-10-23 LAB — AFP TUMOR MARKER: AFP-Tumor Marker: 3.6 ng/mL (ref <6.1)

## 2019-10-24 ENCOUNTER — Ambulatory Visit
Admission: RE | Admit: 2019-10-24 | Discharge: 2019-10-24 | Disposition: A | Discharge status: Home / Self Care | Payer: Medicare Other | Source: Ambulatory Visit | Attending: Family Medicine | Admitting: Family Medicine

## 2019-10-24 ENCOUNTER — Other Ambulatory Visit: Payer: Self-pay

## 2019-10-24 ENCOUNTER — Telehealth: Payer: Self-pay

## 2019-10-24 DIAGNOSIS — R221 Localized swelling, mass and lump, neck: Secondary | ICD-10-CM | POA: Insufficient documentation

## 2019-10-24 DIAGNOSIS — K222 Esophageal obstruction: Secondary | ICD-10-CM | POA: Insufficient documentation

## 2019-10-24 DIAGNOSIS — K228 Other specified diseases of esophagus: Secondary | ICD-10-CM | POA: Diagnosis not present

## 2019-10-24 DIAGNOSIS — I7090 Unspecified atherosclerosis: Secondary | ICD-10-CM | POA: Diagnosis not present

## 2019-10-24 DIAGNOSIS — I6523 Occlusion and stenosis of bilateral carotid arteries: Secondary | ICD-10-CM | POA: Diagnosis not present

## 2019-10-24 MED ORDER — IOHEXOL 300 MG/ML  SOLN
75.0000 mL | Freq: Once | INTRAMUSCULAR | Status: AC | PRN
  Administered 2019-10-24: 75 mL via INTRAVENOUS

## 2019-10-24 NOTE — Progress Notes (Signed)
Addendum: Reviewed and agree with assessment and management plan. Patient with dysphagia and abnormal barium swallow suggesting stricture in the lower esophagus.  This warrants further investigation.  He had the neck/thoracic CT today which did not show any evidence of malignancy.  The deviation in the cervical esophagus is secondary to osteophytes from vertebrae. He also has had weight loss and diarrhea and given considerable weight loss is reasonable to repeat colonoscopy. This should be able to be performed in the [facility] and can be scheduled if patient is agreeable. His last echocardiogram shows a normal EF.  Please ensure that he is not using oxygen at home day or night.  If he is using oxygen procedure will need to be in the outpatient hospital setting. We will need to get permission to hold Plavix from prescribing provider prior to upper and lower endoscopy.  This will need to be held 5 day before endoscopies. If endoscopic evaluation unremarkable, perhaps Ozempic explains some of his weight loss. Nickey Kloepfer, Lajuan Lines, MD

## 2019-10-24 NOTE — Telephone Encounter (Signed)
Left message on machine to call back  

## 2019-10-24 NOTE — Telephone Encounter (Signed)
-----   Message from Jerene Bears, MD sent at 10/24/2019  1:48 PM EDT -----   ----- Message ----- From: Levin Erp, PA Sent: 10/22/2019  10:59 AM EDT To: Jerene Bears, MD

## 2019-10-24 NOTE — Telephone Encounter (Signed)
Addendum: Reviewed and agree with assessment and management plan. Patient with dysphagia and abnormal barium swallow suggesting stricture in the lower esophagus.  This warrants further investigation.  He had the neck/thoracic CT today which did not show any evidence of malignancy.  The deviation in the cervical esophagus is secondary to osteophytes from vertebrae. He also has had weight loss and diarrhea and given considerable weight loss is reasonable to repeat colonoscopy. This should be able to be performed in the [facility] and can be scheduled if patient is agreeable. His last echocardiogram shows a normal EF.  Please ensure that he is not using oxygen at home day or night.  If he is using oxygen procedure will need to be in the outpatient hospital setting. We will need to get permission to hold Plavix from prescribing provider prior to upper and lower endoscopy.  This will need to be held 5 day before endoscopies. If endoscopic evaluation unremarkable, perhaps Ozempic explains some of his weight loss. [place], Lajuan Lines, MD

## 2019-10-25 NOTE — Telephone Encounter (Signed)
Left message on machine to call back  

## 2019-10-25 NOTE — Telephone Encounter (Signed)
The pt has been advised and endo colon scheduled in the [place] with Dr Hilarie Fredrickson.  The pt is not on oxygen and was taken off of blood thinner 2 years ago.  He has also had his COVID vaccine.

## 2019-10-26 LAB — GI PROFILE, STOOL, PCR

## 2019-10-26 LAB — SPECIMEN STATUS REPORT

## 2019-10-27 LAB — STOOL CULTURE: E coli, Shiga toxin Assay: NEGATIVE

## 2019-10-27 LAB — SPECIMEN STATUS REPORT

## 2019-10-29 ENCOUNTER — Encounter: Payer: Self-pay | Admitting: Family Medicine

## 2019-10-29 ENCOUNTER — Telehealth (INDEPENDENT_AMBULATORY_CARE_PROVIDER_SITE_OTHER): Payer: Medicare Other | Admitting: Family Medicine

## 2019-10-29 ENCOUNTER — Other Ambulatory Visit: Payer: Self-pay

## 2019-10-29 DIAGNOSIS — U071 COVID-19: Secondary | ICD-10-CM

## 2019-10-29 DIAGNOSIS — J069 Acute upper respiratory infection, unspecified: Secondary | ICD-10-CM | POA: Diagnosis not present

## 2019-10-29 HISTORY — DX: COVID-19: U07.1

## 2019-10-29 MED ORDER — PROMETHAZINE-DM 6.25-15 MG/5ML PO SYRP: 5.0000 mL | ORAL_SOLUTION | Freq: Four times a day (QID) | ORAL | 0 refills | Status: DC | PRN
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
## 2019-10-29 NOTE — Patient Instructions (Addendum)
Drink fluids and rest Please get tested for covid 19 at a pharmacy or a [facility] site and call us with results   COVID-19 Vaccine Information can be found at: ShippingScam.co.uk For questions related to vaccine distribution or appointments, please email [email or web address] or call [phone].   Try the prometh-dm for cough- I sent it to your pharmacy  If symptoms suddenly worsen or you become short of breath please go to the ER for evaluation   Update if not starting to improve in a week or if worsening

## 2019-10-29 NOTE — Assessment & Plan Note (Signed)
Advised pt to get tested for covid 19 asap either at pharmacy or [facility] site and call us with results  Fluids/rest Tylenol prn pain or fever  prometh-dm sent in for cough prn and warned of sedation  Update if not starting to improve in a week or if worsening  (is sob or severe cough will go to ER for eval)

## 2019-10-30 ENCOUNTER — Telehealth: Payer: Self-pay | Admitting: Physician Assistant

## 2019-10-30 ENCOUNTER — Telehealth: Payer: Self-pay | Admitting: Family Medicine

## 2019-10-30 ENCOUNTER — Ambulatory Visit (HOSPITAL_COMMUNITY): Admission: RE | Admit: 2019-10-30 | Payer: Medicare Other | Source: Ambulatory Visit

## 2019-10-30 DIAGNOSIS — Z20822 Contact with and (suspected) exposure to covid-19: Secondary | ICD-10-CM | POA: Diagnosis not present

## 2019-10-30 DIAGNOSIS — U071 COVID-19: Secondary | ICD-10-CM | POA: Diagnosis not present

## 2019-10-30 DIAGNOSIS — Z20828 Contact with and (suspected) exposure to other viral communicable diseases: Secondary | ICD-10-CM | POA: Diagnosis not present

## 2019-10-30 DIAGNOSIS — Z03818 Encounter for observation for suspected exposure to other biological agents ruled out: Secondary | ICD-10-CM | POA: Diagnosis not present

## 2019-10-30 NOTE — Telephone Encounter (Signed)
Pt's wife called and said that pt is worse than he was yesterday. She said he is so congested and needs relief is there anything else he can take besides cough medicine? He saw Dr. Glori Bickers yesterday.

## 2019-10-30 NOTE — Telephone Encounter (Signed)
The pt was given the number to reschedule [phone] . They will call today to set that up

## 2019-10-30 NOTE — Telephone Encounter (Signed)
For nasal congestion  Breathe steam  Nasal saline spray or netti pot is helpful  He can try afrin ns for short term use (if more than 3 days can cause dependence) - over the counter, I like the 12 hour variety  It may get him through the worst of it   I don't think he can take sudafed as it can raise bp and pulse   Keep [place] posted Let us know when he gets a covid test result

## 2019-10-30 NOTE — Telephone Encounter (Signed)
Left VM letting pt and wife know Dr. Marliss Coots comments

## 2019-10-31 ENCOUNTER — Other Ambulatory Visit: Payer: Self-pay | Admitting: Oncology

## 2019-10-31 DIAGNOSIS — U071 COVID-19: Secondary | ICD-10-CM

## 2019-10-31 NOTE — Telephone Encounter (Addendum)
Noted.  Lvm, with pt's demographics, on [facility] hotline.  Also, added pt to Call List.  Spoke with pt's wife, [name] Pulse (on dpr), relaying Dr. Synthia Innocent message.  Also, notified her to expect a call from the infusion team.  She verbalizes understanding and will inform pt.  Says pt's sxs started 10/27/19.  FYI to Dr. Darnell Level.

## 2019-10-31 NOTE — Progress Notes (Signed)
I connected by phone with  Chad [name]  to discuss the potential use of an new treatment for mild to moderate COVID-19 viral infection in non-hospitalized patients.   This patient is a age/sex that meets the FDA criteria for Emergency Use Authorization of casirivimab\imdevimab.  Has a (+) direct SARS-CoV-2 viral test result 1. Has mild or moderate COVID-19  2. Is ? 64 years of age and weighs ? 40 kg 3. Is NOT hospitalized due to COVID-19 4. Is NOT requiring oxygen therapy or requiring an increase in baseline oxygen flow rate due to COVID-19 5. Is within 10 days of symptom onset 6. Has at least one of the high risk factor(s) for progression to severe COVID-19 and/or hospitalization as defined in EUA. ? Specific high risk criteria :  Past Medical History:  Diagnosis Date  . Abnormal drug screen    innaprop negative for hydrocodone 09/2013, inapprop negative for hydrocodone and tramadol 02/2014; inappropr negative hydrocodone 03/2015  . Acute diverticulitis 08/15/2014  . Arthritis    "both hips and knees; got shots in each hip in August" (01/25/2013)  . Bone spur    L4 L5  . Bulging lumbar disc   . Cirrhosis ([place])   . Coronary artery disease   . Diabetes mellitus without complication ([place])    no medicarions in over 2 years,wt. loss 100 lbs  . Diastolic CHF, chronic ([place]) 04/02/2012  . Diverticulosis   . Gastric bypass status for obesity 1985  . Gastritis 08/31/2015   with focal intestinal metaplasia  . GERD (gastroesophageal reflux disease)    severe, h/o gastritis and GI bleed, per pt normal EGD at [facility] 2008  . Hepatic steatosis   . History of diabetes mellitus 1990s   with mild background retinopathy, resolved with weight loss  . HLD (hyperlipidemia)    statin caused leg cramps  . HTN (hypertension)   . Hyperglycemia glucose over 300 in last 24 hrs 07/12/2016  . Hyperplastic colon polyp 2008  . Hypothyroid   . Internal hemorrhoids   . Morbid obesity ([place])   . Narrowing of lumbar  spine   . OSA (obstructive sleep apnea)    unable to use CPAP as of last try 2/2 h/o tracheostomy?  . Otomycosis of right ear 07/06/2011  . Primary localized osteoarthritis of left knee 06/29/2016  . PVC (premature ventricular contraction)    RBBB Infer axis  . Right ear pain    s/p eval by ENT - thought TMJ referred pain and sent to oral surg for dental splint  . Seasonal allergies   . Sensorineural hearing loss, bilateral    hearing aides  . Splenomegaly   . Thrombocytopenia ([place]) 06/10/2015  . Thrombocytopenia ([place]) 06/10/2015   Platelet count dropped to 73 post op day 2 after total knee   . Tinnitus    due to sensorineural hearing loss R>L with ETD  . Trifascicular block  RBBB/LPFB/1AVB   ?  ?    Symptom onset  10/28/2019   I have spoken and communicated the following to the patient or parent/caregiver:   1. FDA has authorized the emergency use of casirivimab\imdevimab for the treatment of mild to moderate COVID-19 in adults and pediatric patients with positive results of direct SARS-CoV-2 viral testing who are 53 years of age and older weighing at least 40 kg, and who are at high risk for progressing to severe COVID-19 and/or hospitalization.   2. The significant known and potential risks and benefits of casirivimab\imdevimab, and the extent  to which such potential risks and benefits are unknown.   3. Information on available alternative treatments and the risks and benefits of those alternatives, including clinical trials.   4. Patients treated with casirivimab\imdevimab should continue to self-isolate and use infection control measures (e.g., wear mask, isolate, social distance, avoid sharing personal items, clean and disinfect "high touch" surfaces, and frequent handwashing) according to CDC guidelines.    5. The patient or parent/caregiver has the option to accept or refuse casirivimab\imdevimab .   After reviewing this information with the patient, The patient agreed to proceed  with receiving casirivimab\imdevimab infusion and will be provided a copy of the Fact sheet prior to receiving the infusion.Rulon Abide, AGNP-C [phone] ([place])

## 2019-10-31 NOTE — Telephone Encounter (Signed)
Pt went to be tested at [facility] and tested positive. Pt is coughing up now brown and yellow phlegm. Pt's wife said [facility] told them that we would put him on antibiotic? He is also having chills, fatigue, headaches, and body aches. She wanted me to let Dr. Darnell Level know but I am also sending this to you.

## 2019-10-31 NOTE — Telephone Encounter (Addendum)
Sorry to hear he tested positive. Fortunately covid vaccine should give some good protection. Would offer mAb infusion treatment - please report demographics to infusion team.  In interim, rec he take vitamin C, vit D, zinc to boost immune system and plenty of fluids and rest.  Use tylenol for headache and body aches.  If he has pulse ox, monitor O2 sats and if drop <90% rec ER eval. If doesn't have pulse ox, monitor for worsening dyspnea, cyanosis of lips or fingers, or any severe worsening - reasons to seek ER care.  Please place on COVID call list to check on symptoms tomorrow and Q2 days afterwards.  plz confirm first day of symptoms.

## 2019-11-01 ENCOUNTER — Ambulatory Visit (HOSPITAL_COMMUNITY)
Admission: RE | Admit: 2019-11-01 | Discharge: 2019-11-01 | Disposition: A | Discharge status: Home / Self Care | Payer: Medicare Other | Source: Ambulatory Visit | Attending: Pulmonary Disease | Admitting: Pulmonary Disease

## 2019-11-01 DIAGNOSIS — U071 COVID-19: Secondary | ICD-10-CM

## 2019-11-01 DIAGNOSIS — Z23 Encounter for immunization: Secondary | ICD-10-CM | POA: Insufficient documentation

## 2019-11-01 MED ORDER — DIPHENHYDRAMINE HCL 50 MG/ML IJ SOLN: 50.0000 mg | Freq: Once | INTRAMUSCULAR | Status: DC | PRN

## 2019-11-01 MED ORDER — EPINEPHRINE 0.3 MG/0.3ML IJ SOAJ: 0.3000 mg | Freq: Once | INTRAMUSCULAR | Status: DC | PRN

## 2019-11-01 MED ORDER — ALBUTEROL SULFATE HFA 108 (90 BASE) MCG/ACT IN AERS: 2.0000 | INHALATION_SPRAY | Freq: Once | RESPIRATORY_TRACT | Status: DC | PRN

## 2019-11-01 MED ORDER — SODIUM CHLORIDE 0.9 % IV SOLN
1200.0000 mg | Freq: Once | INTRAVENOUS | Status: AC
  Administered 2019-11-01: 1200 mg via INTRAVENOUS
  Filled 2019-11-01: qty 10

## 2019-11-01 MED ORDER — METHYLPREDNISOLONE SODIUM SUCC 125 MG IJ SOLR: 125.0000 mg | Freq: Once | INTRAMUSCULAR | Status: DC | PRN

## 2019-11-01 MED ORDER — FAMOTIDINE IN NACL 20-0.9 MG/50ML-% IV SOLN: 20.0000 mg | Freq: Once | INTRAVENOUS | Status: DC | PRN

## 2019-11-01 MED ORDER — SODIUM CHLORIDE 0.9 % IV SOLN: INTRAVENOUS | Status: DC | PRN

## 2019-11-01 NOTE — Progress Notes (Signed)
  Diagnosis: COVID-19  Physician: Dr Joya Gaskins   Procedure: [facility] Med: casirivimab\imdevimab infusion - Provided patient with casirivimab\imdevimab fact sheet for patients, parents and caregivers prior to infusion.  Complications: No immediate complications noted.  Discharge: Discharged home   Sim Boast 11/01/2019

## 2019-11-01 NOTE — Discharge Instructions (Signed)
COVID-19 COVID-19 is a respiratory infection that is caused by a virus called severe acute respiratory syndrome coronavirus 2 (SARS-CoV-2). The disease is also known as coronavirus disease or novel coronavirus. In some people, the virus may not cause any symptoms. In others, it may cause a serious infection. The infection can get worse quickly and can lead to complications, such as:  Pneumonia, or infection of the lungs.  Acute respiratory distress syndrome or ARDS. This is a condition in which fluid build-up in the lungs prevents the lungs from filling with air and passing oxygen into the blood.  Acute respiratory failure. This is a condition in which there is not enough oxygen passing from the lungs to the body or when carbon dioxide is not passing from the lungs out of the body.  Sepsis or septic shock. This is a serious bodily reaction to an infection.  Blood clotting problems.  Secondary infections due to bacteria or fungus.  Organ failure. This is when your body's organs stop working. The virus that causes COVID-19 is contagious. This means that it can spread from person to person through droplets from coughs and sneezes (respiratory secretions). What are the causes? This illness is caused by a virus. You may catch the virus by:  Breathing in droplets from an infected person. Droplets can be spread by a person breathing, speaking, singing, coughing, or sneezing.  Touching something, like a table or a doorknob, that was exposed to the virus (contaminated) and then touching your mouth, nose, or eyes. What increases the risk? Risk for infection You are more likely to be infected with this virus if you:  Are within 6 feet (2 meters) of a person with COVID-19.  Provide care for or live with a person who is infected with COVID-19.  Spend time in crowded indoor spaces or live in shared housing. Risk for serious illness You are more likely to become seriously ill from the virus if  you:  Are 50 years of age or older. The higher your age, the more you are at risk for serious illness.  Live in a nursing home or long-term care facility.  Have cancer.  Have a long-term (chronic) disease such as: ? Chronic lung disease, including chronic obstructive pulmonary disease or asthma. ? A long-term disease that lowers your body's ability to fight infection (immunocompromised). ? Heart disease, including heart failure, a condition in which the arteries that lead to the heart become narrow or blocked (coronary artery disease), a disease which makes the heart muscle thick, weak, or stiff (cardiomyopathy). ? Diabetes. ? Chronic kidney disease. ? Sickle cell disease, a condition in which red blood cells have an abnormal "sickle" shape. ? Liver disease.  Are obese. What are the signs or symptoms? Symptoms of this condition can range from mild to severe. Symptoms may appear any time from 2 to 14 days after being exposed to the virus. They include:  A fever or chills.  A cough.  Difficulty breathing.  Headaches, body aches, or muscle aches.  Runny or stuffy (congested) nose.  A sore throat.  New loss of taste or smell. Some people may also have stomach problems, such as nausea, vomiting, or diarrhea. Other people may not have any symptoms of COVID-19. How is this diagnosed? This condition may be diagnosed based on:  Your signs and symptoms, especially if: ? You live in an area with a COVID-19 outbreak. ? You recently traveled to or from an area where the virus is common. ? You   provide care for or live with a person who was diagnosed with COVID-19. ? You were exposed to a person who was diagnosed with COVID-19.  A physical exam.  Lab tests, which may include: ? Taking a sample of fluid from the back of your nose and throat (nasopharyngeal fluid), your nose, or your throat using a swab. ? A sample of mucus from your lungs (sputum). ? Blood tests.  Imaging tests,  which may include, X-rays, CT scan, or ultrasound. How is this treated? At present, there is no medicine to treat COVID-19. Medicines that treat other diseases are being used on a trial basis to see if they are effective against COVID-19. Your health care provider will talk with you about ways to treat your symptoms. For most people, the infection is mild and can be managed at home with rest, fluids, and over-the-counter medicines. Treatment for a serious infection usually takes places in a hospital intensive care unit (ICU). It may include one or more of the following treatments. These treatments are given until your symptoms improve.  Receiving fluids and medicines through an IV.  Supplemental oxygen. Extra oxygen is given through a tube in the nose, a face mask, or a hood.  Positioning you to lie on your stomach (prone position). This makes it easier for oxygen to get into the lungs.  Continuous positive airway pressure (CPAP) or bi-level positive airway pressure (BPAP) machine. This treatment uses mild air pressure to keep the airways open. A tube that is connected to a motor delivers oxygen to the body.  Ventilator. This treatment moves air into and out of the lungs by using a tube that is placed in your windpipe.  Tracheostomy. This is a procedure to create a hole in the neck so that a breathing tube can be inserted.  Extracorporeal membrane oxygenation (ECMO). This procedure gives the lungs a chance to recover by taking over the functions of the heart and lungs. It supplies oxygen to the body and removes carbon dioxide. Follow these instructions at home: Lifestyle  If you are sick, stay home except to get medical care. Your health care provider will tell you how long to stay home. Call your health care provider before you go for medical care.  Rest at home as told by your health care provider.  Do not use any products that contain nicotine or tobacco, such as cigarettes,  e-cigarettes, and chewing tobacco. If you need help quitting, ask your health care provider.  Return to your normal activities as told by your health care provider. Ask your health care provider what activities are safe for you. General instructions  Take over-the-counter and prescription medicines only as told by your health care provider.  Drink enough fluid to keep your urine pale yellow.  Keep all follow-up visits as told by your health care provider. This is important. How is this prevented?  There is no vaccine to help prevent COVID-19 infection. However, there are steps you can take to protect yourself and others from this virus. To protect yourself:   Do not travel to areas where COVID-19 is a risk. The areas where COVID-19 is reported change often. To identify high-risk areas and travel restrictions, check the CDC travel website: wwwnc.cdc.gov/travel/notices  If you live in, or must travel to, an area where COVID-19 is a risk, take precautions to avoid infection. ? Stay away from people who are sick. ? Wash your hands often with soap and water for 20 seconds. If soap and water   are not available, use an alcohol-based hand sanitizer. ? Avoid touching your mouth, face, eyes, or nose. ? Avoid going out in public, follow guidance from your state and local health authorities. ? If you must go out in public, wear a cloth face covering or face mask. Make sure your mask covers your nose and mouth. ? Avoid crowded indoor spaces. Stay at least 6 feet (2 meters) away from others. ? Disinfect objects and surfaces that are frequently touched every day. This may include:  Counters and tables.  Doorknobs and light switches.  Sinks and faucets.  Electronics, such as phones, remote controls, keyboards, computers, and tablets. To protect others: If you have symptoms of COVID-19, take steps to prevent the virus from spreading to others.  If you think you have a COVID-19 infection, contact  your health care provider right away. Tell your health care team that you think you may have a COVID-19 infection.  Stay home. Leave your house only to seek medical care. Do not use public transport.  Do not travel while you are sick.  Wash your hands often with soap and water for 20 seconds. If soap and water are not available, use alcohol-based hand sanitizer.  Stay away from other members of your household. Let healthy household members care for children and pets, if possible. If you have to care for children or pets, wash your hands often and wear a mask. If possible, stay in your own room, separate from others. Use a different bathroom.  Make sure that all people in your household wash their hands well and often.  Cough or sneeze into a tissue or your sleeve or elbow. Do not cough or sneeze into your hand or into the air.  Wear a cloth face covering or face mask. Make sure your mask covers your nose and mouth. Where to find more information  Centers for Disease Control and Prevention: www.cdc.gov/coronavirus/2019-ncov/index.html  World Health Organization: www.who.int/health-topics/coronavirus Contact a health care provider if:  You live in or have traveled to an area where COVID-19 is a risk and you have symptoms of the infection.  You have had contact with someone who has COVID-19 and you have symptoms of the infection. Get help right away if:  You have trouble breathing.  You have pain or pressure in your chest.  You have confusion.  You have bluish lips and fingernails.  You have difficulty waking from sleep.  You have symptoms that get worse. These symptoms may represent a serious problem that is an emergency. Do not wait to see if the symptoms will go away. Get medical help right away. Call your local emergency services (911 in the [place] Do not drive yourself to the hospital. Let the emergency medical personnel know if you think you have  COVID-19. Summary  COVID-19 is a respiratory infection that is caused by a virus. It is also known as coronavirus disease or novel coronavirus. It can cause serious infections, such as pneumonia, acute respiratory distress syndrome, acute respiratory failure, or sepsis.  The virus that causes COVID-19 is contagious. This means that it can spread from person to person through droplets from breathing, speaking, singing, coughing, or sneezing.  You are more likely to develop a serious illness if you are 50 years of age or older, have a weak immune system, live in a nursing home, or have chronic disease.  There is no medicine to treat COVID-19. Your health care provider will talk with you about ways to treat your symptoms.    Take steps to protect yourself and others from infection. Wash your hands often and disinfect objects and surfaces that are frequently touched every day. Stay away from people who are sick and wear a mask if you are sick. This information is not intended to replace advice given to you by your health care provider. Make sure you discuss any questions you have with your health care provider. Document Revised: 12/07/2018 Document Reviewed: 03/15/2018 Elsevier Patient Education  2020 [employer] What types of side effects do monoclonal antibody drugs cause?  Common side effects  In general, the more common side effects caused by monoclonal antibody drugs include: . Allergic reactions, such as hives or itching . Flu-like signs and symptoms, including chills, fatigue, fever, and muscle aches and pains . Nausea, vomiting . Diarrhea . Skin rashes . Low blood pressure   The CDC is recommending patients who receive monoclonal antibody treatments wait at least 90 days before being vaccinated.  Currently, there are no data on the safety and efficacy of mRNA COVID-19 vaccines in persons who received monoclonal antibodies or convalescent plasma as part of COVID-19 treatment. Based  on the estimated half-life of such therapies as well as evidence suggesting that reinfection is uncommon in the 90 days after initial infection, vaccination should be deferred for at least 90 days, as a precautionary measure until additional information becomes available, to avoid interference of the antibody treatment with vaccine-induced immune responses. 

## 2019-11-04 ENCOUNTER — Other Ambulatory Visit: Payer: Self-pay | Admitting: Family Medicine

## 2019-11-04 NOTE — Telephone Encounter (Signed)
Glad he's doing some better. plz touch base again on Wednesday.

## 2019-11-04 NOTE — Telephone Encounter (Signed)
Spoke with pt asking for update.  States he is feeling better.  Taste/smell returning and SOB is improving.  Says the infusion helped.  Still c/o weakness, runny nose and coughing up yellow/borwnish mucous.

## 2019-11-04 NOTE — Telephone Encounter (Signed)
Noted  

## 2019-11-05 ENCOUNTER — Other Ambulatory Visit (HOSPITAL_COMMUNITY): Payer: Self-pay

## 2019-11-05 ENCOUNTER — Telehealth: Payer: Self-pay

## 2019-11-05 NOTE — Telephone Encounter (Signed)
Received faxed PA form from [facility] for Promethazine-DM syrup.  Placed form in Dr. Synthia Innocent box.

## 2019-11-06 NOTE — Telephone Encounter (Signed)
Faxed form.

## 2019-11-06 NOTE — Telephone Encounter (Signed)
Noted thank you. plz check on symptoms again on Friday.

## 2019-11-06 NOTE — Telephone Encounter (Signed)
Filled and in Lisa's box.

## 2019-11-06 NOTE — Telephone Encounter (Addendum)
Spoke with pt asking for an update.  States he's feeling better.  Still has some mild fatigue and mild cough.  But smell is returning, still cannot taste anything.

## 2019-11-07 NOTE — Telephone Encounter (Signed)
Received faxed PA denial.  Reason: Under Medicare Part D, drugs used for the symptomatic relief of cough and colds are excluded from coverage.  FYI to Dr. Darnell Level.

## 2019-11-07 NOTE — Telephone Encounter (Signed)
plz notify patient.  He may pay out of pocket if desired. Otherwise use OTC cough med like robitussin or delsym

## 2019-11-07 NOTE — Telephone Encounter (Signed)
Noted  

## 2019-11-07 NOTE — Telephone Encounter (Signed)
Spoke with pt notifying him of PA denial and relayed Dr. Synthia Innocent message.  Pt verbalizes understanding.

## 2019-11-08 NOTE — Telephone Encounter (Signed)
Pt's wife, [name] Pulse, returning call.  States pt is feeling a lot better.  Denies any sxs other than cough.  So they are wondering could it be possible fluid around his heart again.  Also, states pt has OV on 11/13/19 and should he r/s. Plz advise.

## 2019-11-08 NOTE — Telephone Encounter (Signed)
Lvm asking pt to call back.  Need an update on sxs.

## 2019-11-08 NOTE — Telephone Encounter (Signed)
Glad he's doing better. Likely cough related to recent covid Symptoms started 10/27/2019. Ok to keep appt next week.

## 2019-11-11 NOTE — Telephone Encounter (Signed)
Left message on vm per dpr relaying Dr. Synthia Innocent message.

## 2019-11-12 ENCOUNTER — Encounter: Payer: Self-pay | Admitting: Family Medicine

## 2019-11-12 DIAGNOSIS — K229 Disease of esophagus, unspecified: Secondary | ICD-10-CM | POA: Insufficient documentation

## 2019-11-13 ENCOUNTER — Other Ambulatory Visit: Payer: Self-pay

## 2019-11-13 ENCOUNTER — Encounter: Payer: Self-pay | Admitting: Family Medicine

## 2019-11-13 ENCOUNTER — Ambulatory Visit (INDEPENDENT_AMBULATORY_CARE_PROVIDER_SITE_OTHER)
Admission: RE | Admit: 2019-11-13 | Discharge: 2019-11-13 | Disposition: A | Discharge status: Home / Self Care | Payer: Medicare Other | Source: Ambulatory Visit | Attending: Family Medicine | Admitting: Family Medicine

## 2019-11-13 ENCOUNTER — Other Ambulatory Visit: Payer: Self-pay | Admitting: Family Medicine

## 2019-11-13 ENCOUNTER — Ambulatory Visit (INDEPENDENT_AMBULATORY_CARE_PROVIDER_SITE_OTHER): Payer: Medicare Other | Admitting: Family Medicine

## 2019-11-13 VITALS — BP 120/66 | HR 65 | Temp 97.9°F | Ht 69.0 in | Wt 257.1 lb

## 2019-11-13 DIAGNOSIS — E1142 Type 2 diabetes mellitus with diabetic polyneuropathy: Secondary | ICD-10-CM

## 2019-11-13 DIAGNOSIS — M533 Sacrococcygeal disorders, not elsewhere classified: Secondary | ICD-10-CM | POA: Diagnosis not present

## 2019-11-13 DIAGNOSIS — U071 COVID-19: Secondary | ICD-10-CM | POA: Diagnosis not present

## 2019-11-13 DIAGNOSIS — E1165 Type 2 diabetes mellitus with hyperglycemia: Secondary | ICD-10-CM

## 2019-11-13 DIAGNOSIS — E11319 Type 2 diabetes mellitus with unspecified diabetic retinopathy without macular edema: Secondary | ICD-10-CM | POA: Diagnosis not present

## 2019-11-13 DIAGNOSIS — K7581 Nonalcoholic steatohepatitis (NASH): Secondary | ICD-10-CM

## 2019-11-13 DIAGNOSIS — M545 Low back pain, unspecified: Secondary | ICD-10-CM

## 2019-11-13 DIAGNOSIS — R634 Abnormal weight loss: Secondary | ICD-10-CM

## 2019-11-13 DIAGNOSIS — I5032 Chronic diastolic (congestive) heart failure: Secondary | ICD-10-CM | POA: Diagnosis not present

## 2019-11-13 DIAGNOSIS — R197 Diarrhea, unspecified: Secondary | ICD-10-CM

## 2019-11-13 DIAGNOSIS — IMO0002 Reserved for concepts with insufficient information to code with codable children: Secondary | ICD-10-CM

## 2019-11-13 DIAGNOSIS — K7460 Unspecified cirrhosis of liver: Secondary | ICD-10-CM

## 2019-11-13 DIAGNOSIS — K229 Disease of esophagus, unspecified: Secondary | ICD-10-CM

## 2019-11-13 DIAGNOSIS — Z23 Encounter for immunization: Secondary | ICD-10-CM | POA: Diagnosis not present

## 2019-11-13 NOTE — Patient Instructions (Addendum)
Flu shot today  Drop metformin dose to 577m twice daily (1/2 tablet twice daily) until you run out.  Return in 3 months for follow up visit.  xrays of back today.

## 2019-11-13 NOTE — Progress Notes (Signed)
This visit was conducted in person.  BP 120/66 (BP Location: Right Arm, Patient Position: Sitting, Cuff Size: Large)   Pulse 65   Temp 97.9 F (36.6 C) (Temporal)   Ht 5' 9"  (1.753 m)   Wt 257 lb 1 oz (116.6 kg)   SpO2 96%   BMI 37.96 kg/m    CC: 6 wk f/u visit  Subjective:    Patient ID: Chad [name], male    DOB: Jun 01, 1955, 64 y.o.   MRN: [id number]  HPI: Chad [name] is a 64 y.o. male presenting on 11/13/2019 for Follow-up (Here for 6 wk f/u.  Pt accompanied by wife, Chad Pulse- temp 97.9.) and Fall (Pt fell on home.  C/o low right back pain disturbing sleep. )   See prior notes for details.   Dysphagia with abnormal barium swallow suggesting stricture. CT neck reassuringly negative for neck mass but rather found cervical osteophytes as cause of esophageal deviation. Saw GI (Pyrtle), pending EGD/colonoscopy. Abdominal MRI was cancelled due to COVID - will call to reschedule.   Recent covid infection earlier in the month in fully vaccinated patient (and wife) - he received monoclonal antibody infusion treatment 11/01/2019, has recovered well from this. Ok to get flu shot today. covid symptoms started 10/27/2019.   Fall sustained at home 5d ago with residual R lower back pain affecting sleep. Sat down quickly, chair leg broke, head hit [place] hutch and landed on R buttock. Has been using donut cushion to sit. Treating with biofreeze with benefit. No radiculopathy. Standing improves pain, walking and prolonged sititng worsens pain.   DM - wonderful control based on log of sugars he brings - 80-120. Continues farxiga 41m daily, lantus down to 20 units, metformin 10019mbid and ozempic 0.23m34meekly. Ongoing loose stools x1-2, not watery.   No recent chest pain, has not needed nitro in months.      Relevant past medical, surgical, family and social history reviewed and updated as indicated. Interim medical history since our last visit reviewed. Allergies and medications reviewed and  updated. Outpatient Medications Prior to Visit  Medication Sig Dispense Refill  . amLODipine (NORVASC) 5 MG tablet Take 1 tablet (5 mg total) by mouth daily.    . aMarland Kitchenpirin 81 MG chewable tablet Chew 81 mg by mouth daily.     . aMarland Kitchenorvastatin (LIPITOR) 20 MG tablet Take 1 tablet (20 mg total) by mouth daily.    . bumetanide (BUMEX) 2 MG tablet Take 1 tablet (2 mg total) by mouth 2 (two) times daily as needed (weight gain/leg swelling).    . citalopram (CELEXA) 20 MG tablet TAKE 1 TABLET BY MOUTH ONCE DAILY 90 tablet 1  . clotrimazole (LOTRIMIN) 1 % cream Apply 1 application topically 2 (two) times daily. 60 g 0  . clotrimazole-betamethasone (LOTRISONE) cream Apply 1 application topically 2 (two) times daily. 30 g 0  . dapagliflozin propanediol (FARXIGA) 5 MG TABS tablet Take 5 mg by mouth daily before breakfast.    . DEXILANT 60 MG capsule TAKE 1 CAPSULE BY MOUTH ONCE DAILY 30 capsule 11  . diazepam (VALIUM) 5 MG tablet Take 1 tablet (5 mg total) by mouth every 12 (twelve) hours as needed for anxiety. 30 tablet 1  . Docusate Sodium (DSS) 100 MG CAPS 1 tab 2 times a day while on narcotics.  STOOL SOFTENER 60 each 0  . famotidine (PEPCID) 20 MG tablet TAKE 1 TABLET BY MOUTH EVERY NIGHT AT BEDTIME 90 tablet 1  . ferrous sulfate  325 (65 FE) MG EC tablet Take 1 tablet (325 mg total) by mouth every other day.    . fluticasone (FLONASE) 50 MCG/ACT nasal spray PLACE 2 SPRAYS INTO BOTH NOSTRILS DAILY 16 g 2  . HYDROcodone-acetaminophen (NORCO/VICODIN) 5-325 MG tablet Take 1 tablet by mouth every 6 (six) hours as needed for moderate pain. 15 tablet 0  . insulin glargine (LANTUS SOLOSTAR) 100 UNIT/ML Solostar Pen Inject 24 Units into the skin 2 (two) times daily.    . Insulin Pen Needle (ULTICARE MINI PEN NEEDLES) 31G X 6 MM MISC Need follow up scheduled in the next 1 to 2 months USE AS DIRECTED FOR LANTUS SOLOSTAR PEN 100 each 3  . isosorbide mononitrate (IMDUR) 60 MG 24 hr tablet TAKE 3 TABLETS BY MOUTH ONCE  DAILY 90 tablet 5  . levothyroxine (SYNTHROID) 125 MCG tablet TAKE 1 TAB BY MOUTH ONCE DAILY. TAKE ON AN EMPTY STOMACH WITH A GLASS OF WATER ATLEAST 30-60 MINUTES BEFORE BREAKFAST 30 tablet 5  . lisinopril (PRINIVIL,ZESTRIL) 20 MG tablet Take 1 tablet (20 mg total) by mouth daily. 90 tablet 3  . magnesium oxide (MAG-OX) 400 MG tablet Take 1 tablet by mouth 2 (two) times daily.    . metFORMIN (GLUCOPHAGE) 1000 MG tablet Take 0.5 tablets (500 mg total) by mouth 2 (two) times daily with a meal.    . metoCLOPramide (REGLAN) 10 MG tablet TAKE 1 TABLET BY MOUTH 3 TIMES DAILY BEFORE MEALS 90 tablet 0  . metolazone (ZAROXOLYN) 2.5 MG tablet Take 1 tablet (2.5 mg total) by mouth daily as needed (for more than 3lb/day or 5lb/wk weight gain).    . metoprolol succinate (TOPROL-XL) 50 MG 24 hr tablet TAKE 1 TABLET BY MOUTH ONCE DAILY 90 tablet 1  . Multiple Vitamin (MULITIVITAMIN WITH MINERALS) TABS Take 1 tablet by mouth daily.    . nitroGLYCERIN (NITROSTAT) 0.4 MG SL tablet Place 1 tablet (0.4 mg total) under the tongue every 5 (five) minutes as needed for chest pain. 60 tablet 3  . OZEMPIC, 0.25 OR 0.5 MG/DOSE, 2 MG/1.5ML SOPN INJECT 0.5 MG INTO THE SKIN ONCE A WEEK AS DIRECTED 1.5 mL 1  . polyethylene glycol (MIRALAX / GLYCOLAX) packet 17grams in 16 oz of water twice a day until bowel movement.  LAXITIVE.  Restart if two days since last bowel movement (Patient taking differently: 17grams in 16 oz of water once daily  LAXITIVE.  Restart if two days since last bowel movement) 14 each 0  . potassium chloride (K-DUR,KLOR-CON) 10 MEQ tablet Take 10 mEq by mouth 2 (two) times daily.    . promethazine-dextromethorphan (PROMETHAZINE-DM) 6.25-15 MG/5ML syrup Take 5 mLs by mouth 4 (four) times daily as needed for cough. 118 mL 0  . spironolactone (ALDACTONE) 25 MG tablet Take 1 tablet by mouth daily.    . sucralfate (CARAFATE) 1 g tablet TAKE 1 TABLET BY MOUTH TWICE (2) DAILY BEFORE A MEAL 60 tablet 1  . VENTOLIN HFA  108 (90 Base) MCG/ACT inhaler INHALE 2 PUFFS INTO THE LUNGS EVERY 6 HOURS AS NEEDED FOR WHEEZING OR SHORTNESS OF BREATH 18 g 5  . vitamin E 400 UNIT capsule Take 400 Units by mouth daily.    . metFORMIN (GLUCOPHAGE) 1000 MG tablet TAKE 1 TABLET BY MOUTH TWICE A DAY WITH A MEAL 180 tablet 0  . methylPREDNISolone (MEDROL DOSEPAK) 4 MG TBPK tablet Take 6 pills on day one then decrease by 1 pill each day 21 tablet 0   No facility-administered medications  prior to visit.     Per HPI unless specifically indicated in ROS section below Review of Systems Objective:  BP 120/66 (BP Location: Right Arm, Patient Position: Sitting, Cuff Size: Large)   Pulse 65   Temp 97.9 F (36.6 C) (Temporal)   Ht 5' 9"  (1.753 m)   Wt 257 lb 1 oz (116.6 kg)   SpO2 96%   BMI 37.96 kg/m   Wt Readings from Last 3 Encounters:  11/13/19 257 lb 1 oz (116.6 kg)  10/29/19 260 lb (117.9 kg)  10/22/19 261 lb 6 oz (118.6 kg)     Physical Exam Vitals and nursing note reviewed.  Constitutional:      Appearance: Normal appearance. He is obese. He is not ill-appearing.  Eyes:     Extraocular Movements: Extraocular movements intact.     Pupils: Pupils are equal, round, and reactive to light.  Cardiovascular:     Rate and Rhythm: Normal rate and regular rhythm.     Pulses: Normal pulses.     Heart sounds: Normal heart sounds. No murmur heard.   Pulmonary:     Effort: Pulmonary effort is normal. No respiratory distress.     Breath sounds: Normal breath sounds. No wheezing, rhonchi or rales.  Musculoskeletal:     Right lower leg: No edema.     Left lower leg: No edema.     Comments:  Discomfort to midline palpation at lower lumbar and sarcal spine  No paraspinous mm tenderness Neg SLR bilaterally. No pain with int/ext rotation at hip. No pain at SIJ, GTB or sciatic notch bilaterally. a  Skin:    General: Skin is warm and dry.  Neurological:     Mental Status: He is alert.     Comments: 5/5 strength BLE    Psychiatric:        Mood and Affect: Mood normal.        Behavior: Behavior normal.       DG Lumbar Spine Complete CLINICAL DATA:  Low back pain after fall.  EXAM: LUMBAR SPINE - COMPLETE 4+ VIEW  COMPARISON:  None.  FINDINGS: No fracture or spondylolisthesis is noted. Mild degenerative disc disease is noted at L1-2 and L3-4 with anterior osteophyte formation. Degenerative changes are seen involving the posterior facet joints of L4-5 and L5-S1 bilaterally.  IMPRESSION: Mild multilevel degenerative disc disease. No acute abnormality seen in the lumbar spine.  Electronically Signed   By: Marijo Conception M.D.   On: 11/13/2019 16:04 DG Sacrum/Coccyx CLINICAL DATA:  Sacral pain after fall.  EXAM: SACRUM AND COCCYX - 2+ VIEW  COMPARISON:  None.  FINDINGS: There is no evidence of fracture or other focal bone lesions.  IMPRESSION: Negative.  Electronically Signed   By: Marijo Conception M.D.   On: 11/13/2019 16:03   Lab Results  Component Value Date   HGBA1C 5.1 10/02/2019    Assessment & Plan:  This visit occurred during the SARS-CoV-2 public health emergency.  Safety protocols were in place, including screening questions prior to the visit, additional usage of staff PPE, and extensive cleaning of exam room while observing appropriate contact time as indicated for disinfecting solutions.   Problem List Items Addressed This Visit    Unintended weight loss    Ongoing weight loss - another 7 lbs since last seen, total of 51 lbsin the past year. ?ozempic related. Recent reassuring imaging (CT neck, abd/pelvis). Pending EGD/colonoscopy. Appreciate GI care.       Type 2 diabetes  mellitus, uncontrolled, with retinopathy ([place])    Chronic, wonderful control based on sugar log he brings - will scan.  Will decrease metformin to 5103m BID to see if any improvement in chronic diarrhea. Continue other regimen of farxiga, ozempic, lantus.       Relevant Medications    metFORMIN (GLUCOPHAGE) 1000 MG tablet   Severe obesity (BMI 35.0-39.9) with comorbidity ([place]    Ongoing weight loss ?ozempic related.       Relevant Medications   metFORMIN (GLUCOPHAGE) 1000 MG tablet   Pain of lumbosacral spine - Primary    Point tenderness to midline lower lumbar spine into sacrum after fall sustained 5 days ago - check xrays r/o fracture. Discussed tylenol, biofreeze use.       Relevant Orders   DG Sacrum/Coccyx (Completed)   DG Lumbar Spine Complete (Completed)   Liver cirrhosis secondary to NASH (HCC) (Chronic)    Discussed tylenol use - ok to use in limited amounts, rec no more than 10079m/day      Diarrhea    Ongoing but mild - 1-2 loose stools per day. Given improvement in glycemic control, will decrease metformin to 50086mwice daily.       Diabetic neuropathy associated with type 2 diabetes mellitus ([place] Relevant Medications   metFORMIN (GLUCOPHAGE) 1000 MG tablet   COVID-19 virus infection    Fortunately recovering well from recent COVID infection, symptoms starting 10/27/2019. He did receive mAb infusion treatment. No significant residual symptoms.       Chronic diastolic CHF (congestive heart failure) (HCC)    Overall very stable period without chest pain - seems euvolemic.  Continue bumex 2mg54mD with lasix and PRN metolazone.       Abnormality of esophagus    Reassuring neck CT showing deviation due to cervical osteophytes. Pending EGD for possible stricture.        Other Visit Diagnoses    Need for influenza vaccination       Relevant Orders   Flu Vaccine QUAD 36+ mos IM (Completed)       No orders of the defined types were placed in this encounter.  Orders Placed This Encounter  Procedures  . DG Sacrum/Coccyx    Standing Status:   Future    Number of Occurrences:   1    Standing Expiration Date:   11/12/2020    Order Specific Question:   Reason for Exam (SYMPTOM  OR DIAGNOSIS REQUIRED)    Answer:   sacral pain after fall     Order Specific Question:   Preferred imaging location?    Answer:   [facility] Specific Question:   Release to patient    Answer:   Immediate    Order Specific Question:   Radiology Contrast Protocol - do NOT remove file path    Answer:   [email or web address]  . DG Lumbar Spine Complete    Standing Status:   Future    Number of Occurrences:   1    Standing Expiration Date:   11/12/2020    Order Specific Question:   Reason for Exam (SYMPTOM  OR DIAGNOSIS REQUIRED)    Answer:   midline lumbar pain after fall    Order Specific Question:   Preferred imaging location?    Answer:   [facility] Specific Question:   Radiology Contrast Protocol - do NOT remove file path    Answer:   [email or web address].pdf  .  Flu Vaccine QUAD 36+ mos IM    Patient Instructions  Flu shot today  Drop metformin dose to 566m twice daily (1/2 tablet twice daily) until you run out.  Return in 3 months for follow up visit.  xrays of back today.   Follow up plan: Return in about 3 months (around 02/12/2020) for follow up visit.  JRia Bush MD

## 2019-11-14 ENCOUNTER — Encounter: Payer: Self-pay | Admitting: Family Medicine

## 2019-11-14 ENCOUNTER — Other Ambulatory Visit: Payer: Self-pay | Admitting: Family Medicine

## 2019-11-14 DIAGNOSIS — M545 Low back pain, unspecified: Secondary | ICD-10-CM | POA: Insufficient documentation

## 2019-11-14 MED ORDER — HYDROCODONE-ACETAMINOPHEN 5-325 MG PO TABS
1.0000 | ORAL_TABLET | Freq: Four times a day (QID) | ORAL | 0 refills | Status: DC | PRN
Start: 2019-11-14 — End: 2019-11-26

## 2019-11-14 NOTE — Assessment & Plan Note (Signed)
Reassuring neck CT showing deviation due to cervical osteophytes. Pending EGD for possible stricture.

## 2019-11-14 NOTE — Assessment & Plan Note (Signed)
Fortunately recovering well from recent COVID infection, symptoms starting 10/27/2019. He did receive mAb infusion treatment. No significant residual symptoms.

## 2019-11-14 NOTE — Assessment & Plan Note (Signed)
Ongoing but mild - 1-2 loose stools per day. Given improvement in glycemic control, will decrease metformin to 549m twice daily.

## 2019-11-14 NOTE — Assessment & Plan Note (Signed)
Point tenderness to midline lower lumbar spine into sacrum after fall sustained 5 days ago - check xrays r/o fracture. Discussed tylenol, biofreeze use.

## 2019-11-14 NOTE — Assessment & Plan Note (Signed)
Discussed tylenol use - ok to use in limited amounts, rec no more than 1052m /day

## 2019-11-14 NOTE — Assessment & Plan Note (Addendum)
Ongoing weight loss ?ozempic related.

## 2019-11-14 NOTE — Assessment & Plan Note (Addendum)
Overall very stable period without chest pain - seems euvolemic.  Continue bumex 10m BID with lasix and PRN metolazone.

## 2019-11-14 NOTE — Assessment & Plan Note (Signed)
Ongoing weight loss - another 7 lbs since last seen, total of 51 lbsin the past year. ?ozempic related. Recent reassuring imaging (CT neck, abd/pelvis). Pending EGD/colonoscopy. Appreciate GI care.

## 2019-11-14 NOTE — Assessment & Plan Note (Addendum)
Chronic, wonderful control based on sugar log he brings - will scan.  Will decrease metformin to 54m BID to see if any improvement in chronic diarrhea. Continue other regimen of farxiga, ozempic, lantus.

## 2019-11-21 ENCOUNTER — Ambulatory Visit (HOSPITAL_COMMUNITY)
Admission: RE | Admit: 2019-11-21 | Discharge: 2019-11-21 | Disposition: A | Discharge status: Home / Self Care | Payer: Medicare Other | Source: Ambulatory Visit | Attending: Physician Assistant | Admitting: Physician Assistant

## 2019-11-21 ENCOUNTER — Other Ambulatory Visit: Payer: Self-pay

## 2019-11-21 DIAGNOSIS — R634 Abnormal weight loss: Secondary | ICD-10-CM | POA: Diagnosis not present

## 2019-11-21 DIAGNOSIS — D1803 Hemangioma of intra-abdominal structures: Secondary | ICD-10-CM | POA: Diagnosis not present

## 2019-11-21 DIAGNOSIS — R197 Diarrhea, unspecified: Secondary | ICD-10-CM | POA: Diagnosis not present

## 2019-11-21 DIAGNOSIS — K7460 Unspecified cirrhosis of liver: Secondary | ICD-10-CM | POA: Insufficient documentation

## 2019-11-21 DIAGNOSIS — K7581 Nonalcoholic steatohepatitis (NASH): Secondary | ICD-10-CM | POA: Diagnosis not present

## 2019-11-21 DIAGNOSIS — R1030 Lower abdominal pain, unspecified: Secondary | ICD-10-CM | POA: Diagnosis not present

## 2019-11-21 DIAGNOSIS — R630 Anorexia: Secondary | ICD-10-CM | POA: Diagnosis not present

## 2019-11-21 MED ORDER — GADOBUTROL 1 MMOL/ML IV SOLN
10.0000 mL | Freq: Once | INTRAVENOUS | Status: AC | PRN
  Administered 2019-11-21: 10 mL via INTRAVENOUS

## 2019-11-25 ENCOUNTER — Other Ambulatory Visit: Payer: Self-pay | Admitting: Family Medicine

## 2019-11-26 NOTE — Telephone Encounter (Signed)
Name of Medication: Hydrocodone-APAP Name of Pharmacy: [facility] or Written Date and Quantity: 11/14/19, #15 Last Office Visit and Type: 11/13/19, 6 wk back pain f/u Next Office Visit and Type: 02/12/20, 3 mo f/u Last Controlled Substance Agreement Date: 04/03/15 Last UDS: 04/03/15

## 2019-11-26 NOTE — Telephone Encounter (Signed)
ERx 

## 2019-12-08 DIAGNOSIS — M79602 Pain in left arm: Secondary | ICD-10-CM | POA: Diagnosis not present

## 2019-12-08 DIAGNOSIS — Z79890 Hormone replacement therapy (postmenopausal): Secondary | ICD-10-CM | POA: Diagnosis not present

## 2019-12-08 DIAGNOSIS — M542 Cervicalgia: Secondary | ICD-10-CM | POA: Diagnosis not present

## 2019-12-08 DIAGNOSIS — I252 Old myocardial infarction: Secondary | ICD-10-CM | POA: Diagnosis not present

## 2019-12-08 DIAGNOSIS — S2249XA Multiple fractures of ribs, unspecified side, initial encounter for closed fracture: Secondary | ICD-10-CM | POA: Diagnosis not present

## 2019-12-08 DIAGNOSIS — I110 Hypertensive heart disease with heart failure: Secondary | ICD-10-CM | POA: Diagnosis not present

## 2019-12-08 DIAGNOSIS — H9193 Unspecified hearing loss, bilateral: Secondary | ICD-10-CM | POA: Diagnosis not present

## 2019-12-08 DIAGNOSIS — R1310 Dysphagia, unspecified: Secondary | ICD-10-CM | POA: Diagnosis not present

## 2019-12-08 DIAGNOSIS — E039 Hypothyroidism, unspecified: Secondary | ICD-10-CM | POA: Diagnosis not present

## 2019-12-08 DIAGNOSIS — E119 Type 2 diabetes mellitus without complications: Secondary | ICD-10-CM | POA: Diagnosis not present

## 2019-12-08 DIAGNOSIS — I440 Atrioventricular block, first degree: Secondary | ICD-10-CM | POA: Diagnosis not present

## 2019-12-08 DIAGNOSIS — R0602 Shortness of breath: Secondary | ICD-10-CM | POA: Diagnosis not present

## 2019-12-08 DIAGNOSIS — R110 Nausea: Secondary | ICD-10-CM | POA: Diagnosis not present

## 2019-12-08 DIAGNOSIS — R531 Weakness: Secondary | ICD-10-CM | POA: Diagnosis not present

## 2019-12-08 DIAGNOSIS — Z794 Long term (current) use of insulin: Secondary | ICD-10-CM | POA: Diagnosis not present

## 2019-12-08 DIAGNOSIS — I4510 Unspecified right bundle-branch block: Secondary | ICD-10-CM | POA: Diagnosis not present

## 2019-12-08 DIAGNOSIS — I200 Unstable angina: Secondary | ICD-10-CM | POA: Diagnosis not present

## 2019-12-08 DIAGNOSIS — R0789 Other chest pain: Secondary | ICD-10-CM | POA: Diagnosis not present

## 2019-12-08 DIAGNOSIS — I288 Other diseases of pulmonary vessels: Secondary | ICD-10-CM | POA: Diagnosis not present

## 2019-12-08 DIAGNOSIS — R918 Other nonspecific abnormal finding of lung field: Secondary | ICD-10-CM | POA: Diagnosis not present

## 2019-12-08 DIAGNOSIS — I2510 Atherosclerotic heart disease of native coronary artery without angina pectoris: Secondary | ICD-10-CM | POA: Diagnosis not present

## 2019-12-08 DIAGNOSIS — S2241XA Multiple fractures of ribs, right side, initial encounter for closed fracture: Secondary | ICD-10-CM | POA: Diagnosis not present

## 2019-12-08 DIAGNOSIS — K219 Gastro-esophageal reflux disease without esophagitis: Secondary | ICD-10-CM | POA: Diagnosis not present

## 2019-12-08 DIAGNOSIS — Z20822 Contact with and (suspected) exposure to covid-19: Secondary | ICD-10-CM | POA: Diagnosis not present

## 2019-12-08 DIAGNOSIS — F32A Depression, unspecified: Secondary | ICD-10-CM | POA: Diagnosis not present

## 2019-12-08 DIAGNOSIS — I5032 Chronic diastolic (congestive) heart failure: Secondary | ICD-10-CM | POA: Diagnosis not present

## 2019-12-08 DIAGNOSIS — K7581 Nonalcoholic steatohepatitis (NASH): Secondary | ICD-10-CM | POA: Diagnosis not present

## 2019-12-08 DIAGNOSIS — E785 Hyperlipidemia, unspecified: Secondary | ICD-10-CM | POA: Diagnosis not present

## 2019-12-09 DIAGNOSIS — I5030 Unspecified diastolic (congestive) heart failure: Secondary | ICD-10-CM | POA: Diagnosis not present

## 2019-12-09 DIAGNOSIS — I10 Essential (primary) hypertension: Secondary | ICD-10-CM | POA: Diagnosis not present

## 2019-12-09 DIAGNOSIS — Z6838 Body mass index (BMI) 38.0-38.9, adult: Secondary | ICD-10-CM | POA: Diagnosis not present

## 2019-12-09 DIAGNOSIS — E039 Hypothyroidism, unspecified: Secondary | ICD-10-CM | POA: Diagnosis not present

## 2019-12-09 DIAGNOSIS — R079 Chest pain, unspecified: Secondary | ICD-10-CM | POA: Diagnosis not present

## 2019-12-09 DIAGNOSIS — E119 Type 2 diabetes mellitus without complications: Secondary | ICD-10-CM | POA: Diagnosis not present

## 2019-12-09 DIAGNOSIS — I440 Atrioventricular block, first degree: Secondary | ICD-10-CM | POA: Diagnosis not present

## 2019-12-09 DIAGNOSIS — I6522 Occlusion and stenosis of left carotid artery: Secondary | ICD-10-CM | POA: Diagnosis not present

## 2019-12-09 DIAGNOSIS — I5032 Chronic diastolic (congestive) heart failure: Secondary | ICD-10-CM | POA: Diagnosis not present

## 2019-12-09 DIAGNOSIS — Z8616 Personal history of COVID-19: Secondary | ICD-10-CM | POA: Diagnosis not present

## 2019-12-09 DIAGNOSIS — E785 Hyperlipidemia, unspecified: Secondary | ICD-10-CM | POA: Diagnosis not present

## 2019-12-09 DIAGNOSIS — Z794 Long term (current) use of insulin: Secondary | ICD-10-CM | POA: Diagnosis not present

## 2019-12-09 DIAGNOSIS — I2510 Atherosclerotic heart disease of native coronary artery without angina pectoris: Secondary | ICD-10-CM | POA: Diagnosis not present

## 2019-12-09 DIAGNOSIS — I110 Hypertensive heart disease with heart failure: Secondary | ICD-10-CM | POA: Diagnosis not present

## 2019-12-09 DIAGNOSIS — I4510 Unspecified right bundle-branch block: Secondary | ICD-10-CM | POA: Diagnosis not present

## 2019-12-09 DIAGNOSIS — R0602 Shortness of breath: Secondary | ICD-10-CM | POA: Diagnosis not present

## 2019-12-09 DIAGNOSIS — R0789 Other chest pain: Secondary | ICD-10-CM | POA: Diagnosis not present

## 2019-12-17 ENCOUNTER — Ambulatory Visit (AMBULATORY_SURGERY_CENTER): Payer: Self-pay | Admitting: *Deleted

## 2019-12-17 ENCOUNTER — Other Ambulatory Visit: Payer: Self-pay

## 2019-12-17 VITALS — Ht 69.0 in | Wt 262.8 lb

## 2019-12-17 DIAGNOSIS — R197 Diarrhea, unspecified: Secondary | ICD-10-CM

## 2019-12-17 DIAGNOSIS — R634 Abnormal weight loss: Secondary | ICD-10-CM

## 2019-12-17 DIAGNOSIS — R933 Abnormal findings on diagnostic imaging of other parts of digestive tract: Secondary | ICD-10-CM

## 2019-12-17 MED ORDER — PLENVU 140 G PO SOLR: 1.0000 | ORAL | 0 refills | Status: DC

## 2019-12-17 NOTE — Progress Notes (Signed)
Patient denies any allergies to egg or soy products. Patient denies complications with anesthesia/sedation.  Patient denies oxygen use at home and denies diet medications. Patient denies information on colonoscopy/endoscopy procedures.

## 2019-12-18 ENCOUNTER — Telehealth: Payer: Self-pay | Admitting: *Deleted

## 2019-12-18 NOTE — Telephone Encounter (Signed)
-----   Message from Jerene Bears, MD sent at 12/18/2019  3:06 PM EDT ----- Regarding: FW: [place] pt Procedures on for Nov 2 need to move to outpt hospital setting per anesthesia JMP ----- Message ----- From: Osvaldo Angst, CRNA Sent: 12/18/2019   1:18 PM EDT To: Jerene Bears, MD Subject: [facility] pt                                         Dr. Hilarie Fredrickson,  This pt is scheduled with you on Nov 2. As evidenced by his most recent PFT's, he has severe restrictive lung disease and his procedure will need to be done at the hospital.  Thanks,  Osvaldo Angst

## 2019-12-19 NOTE — Telephone Encounter (Signed)
Dr Hilarie Fredrickson, your next "yellow" half day hospital availability is not until 02/17/20. Is this patient appropriate to wait until then or does he need to have procedure sooner? He was being seen for weight loss, diarrhea and abnormal barium esophagram. Recent procedure originally scheduled in [place] cancelled due to severe restrictive lung disease evidenced by recent PFT's.  I have spoken to patient already to advise that we will be cancelling his 12/24/19 appointment due to findings of severe lung disease and will instead need to find a hospital date to do his procedures. He verbalizes understanding and is advised that I will be in touch with him regarding hospital appointment soon.

## 2019-12-20 ENCOUNTER — Other Ambulatory Visit: Payer: Self-pay

## 2019-12-20 ENCOUNTER — Ambulatory Visit (INDEPENDENT_AMBULATORY_CARE_PROVIDER_SITE_OTHER): Payer: Medicare Other | Admitting: Family Medicine

## 2019-12-20 ENCOUNTER — Encounter: Payer: Self-pay | Admitting: Family Medicine

## 2019-12-20 VITALS — BP 116/60 | HR 87 | Temp 97.5°F | Ht 69.0 in | Wt 258.1 lb

## 2019-12-20 DIAGNOSIS — E1169 Type 2 diabetes mellitus with other specified complication: Secondary | ICD-10-CM

## 2019-12-20 DIAGNOSIS — E1165 Type 2 diabetes mellitus with hyperglycemia: Secondary | ICD-10-CM

## 2019-12-20 DIAGNOSIS — R0602 Shortness of breath: Secondary | ICD-10-CM | POA: Diagnosis not present

## 2019-12-20 DIAGNOSIS — I10 Essential (primary) hypertension: Secondary | ICD-10-CM | POA: Diagnosis not present

## 2019-12-20 DIAGNOSIS — IMO0002 Reserved for concepts with insufficient information to code with codable children: Secondary | ICD-10-CM

## 2019-12-20 DIAGNOSIS — R197 Diarrhea, unspecified: Secondary | ICD-10-CM | POA: Diagnosis not present

## 2019-12-20 DIAGNOSIS — R0789 Other chest pain: Secondary | ICD-10-CM

## 2019-12-20 DIAGNOSIS — K219 Gastro-esophageal reflux disease without esophagitis: Secondary | ICD-10-CM

## 2019-12-20 DIAGNOSIS — I5032 Chronic diastolic (congestive) heart failure: Secondary | ICD-10-CM

## 2019-12-20 DIAGNOSIS — E11319 Type 2 diabetes mellitus with unspecified diabetic retinopathy without macular edema: Secondary | ICD-10-CM

## 2019-12-20 DIAGNOSIS — E785 Hyperlipidemia, unspecified: Secondary | ICD-10-CM | POA: Diagnosis not present

## 2019-12-20 NOTE — Patient Instructions (Addendum)
You haven't been taking amlodipine and lisinopril.  Ok to stay off magnesium.  We will stay off insulin but continue weekly shots.  Restart metformin at 1/2 pill daily (total of 557m once daily) - watch for return of diarrhea.  Check with heart doctor about metoprolol, spironolactone, and amlodipine and lisinopril.  Continue other medicines for now. Med list updated.  Keep an eye on blood pressure and sugars, let [place]now if staying too low.

## 2019-12-20 NOTE — Progress Notes (Signed)
This visit was conducted in person.  BP 116/60 (BP Location: Right Arm, Patient Position: Sitting, Cuff Size: Large)   Pulse 87   Temp (!) 97.5 F (36.4 C) (Temporal)   Ht 5' 9"  (1.753 m)   Wt 258 lb 2 oz (117.1 kg)   SpO2 97%   BMI 38.12 kg/m    CC: hosp f/u visit  Subjective:    Patient ID: Chad [name], male    DOB: 09/06/1955, 64 y.o.   MRN: [id number]  HPI: Chad [name] is a 64 y.o. male presenting on 12/20/2019 for Hospitalization Follow-up (Seen on 12/08/19 at [facility]. )   Recent hospitalization at [facility] for chest and L chest wall pain, D/C summary reviewed and will be scanned. He felt very fatigued and malaise while at church. Also had L anterior neck pain. Workup included stable EKG with RBBB, 1st degree AVB, negative cycled cardiac enzymes, and stable carotid US and echocardiogram. Found to have low BP 100/60 and low A1c (5.1%) so medications were de escalated.   Lantus lisinopril, amlodipine, bumex held at discharge.   Since home has been off metformin 1071m bid, toprol XL 581m spironolactone 2522matorvastatin 20m40melexa 20mg41m magnesium. Off lantus insulin. He'd not been taking amlodipine or lisinopril even prior to hospitalization.  Advised restart atorvastatin and celexa.   Since home has been taking farxiga 5mg, 27mothyroxine 125mcg 19my, bumex 1mg QD 9mBID PRN, famotidine, dexilant, and isosorbide 60mg 2 i41m and 1 at night. Continues ozempic  Since home, fasting cbg's 100-140s.   Upcoming cardiology appointment next month   Admit date 12/08/2019 Discharge date 12/09/2019 No TCM hosp f/u phone call completed     Relevant past medical, surgical, family and social history reviewed and updated as indicated. Interim medical history since our last visit reviewed. Allergies and medications reviewed and updated. Outpatient Medications Prior to Visit  Medication Sig Dispense Refill  . atorvastatin (LIPITOR) 20 MG tablet Take 1 tablet (20  mg total) by mouth daily.    . bumetanide (BUMEX) 2 MG tablet Take 2 mg by mouth 2 (two) times daily as needed (weight gain/leg swelling).     . dapagliflozin propanediol (FARXIGA) 5 MG TABS tablet Take 5 mg by mouth daily before breakfast.    . DEXILANT 60 MG capsule TAKE 1 CAPSULE BY MOUTH ONCE DAILY 30 capsule 11  . diazepam (VALIUM) 5 MG tablet Take 1 tablet (5 mg total) by mouth every 12 (twelve) hours as needed for anxiety. 30 tablet 1  . Docusate Sodium (DSS) 100 MG CAPS 1 tab 2 times a day while on narcotics.  STOOL SOFTENER 60 each 0  . famotidine (PEPCID) 20 MG tablet TAKE 1 TABLET BY MOUTH EVERY NIGHT AT BEDTIME 90 tablet 1  . ferrous sulfate 325 (65 FE) MG EC tablet Take 1 tablet (325 mg total) by mouth every other day.    . fluticasone (FLONASE) 50 MCG/ACT nasal spray PLACE 2 SPRAYS INTO BOTH NOSTRILS DAILY 16 g 2  . HYDROcodone-acetaminophen (NORCO/VICODIN) 5-325 MG tablet TAKE 1 TABLET BY MOUTH EVERY 6 HOURS AS NEEDED FOR MODERATE PAIN 15 tablet 0  . Insulin Pen Needle (ULTICARE MINI PEN NEEDLES) 31G X 6 MM MISC Need follow up scheduled in the next 1 to 2 months USE AS DIRECTED FOR LANTUS SOLOSTAR PEN 100 each 3  . isosorbide mononitrate (IMDUR) 60 MG 24 hr tablet TAKE 3 TABLETS BY MOUTH ONCE DAILY 90 tablet 5  . levothyroxine (SYNTHROID)  125 MCG tablet TAKE 1 TAB BY MOUTH ONCE DAILY. TAKE ON AN EMPTY STOMACH WITH A GLASS OF WATER ATLEAST 30-60 MINUTES BEFORE BREAKFAST 30 tablet 5  . Multiple Vitamin (MULITIVITAMIN WITH MINERALS) TABS Take 1 tablet by mouth daily.    . nitroGLYCERIN (NITROSTAT) 0.4 MG SL tablet Place 1 tablet (0.4 mg total) under the tongue every 5 (five) minutes as needed for chest pain. 60 tablet 3  . OZEMPIC, 0.25 OR 0.5 MG/DOSE, 2 MG/1.5ML SOPN INJECT 0.5 MG INTO THE SKIN ONCE A WEEK AS DIRECTED (Patient taking differently: On Monday) 1.5 mL 5  . PEG-KCl-NaCl-NaSulf-Na Asc-C (PLENVU) 140 g SOLR Take 1 kit by mouth as directed. 1 each 0  . polyethylene glycol  (MIRALAX / GLYCOLAX) packet 17grams in 16 oz of water twice a day until bowel movement.  LAXITIVE.  Restart if two days since last bowel movement 14 each 0  . VENTOLIN HFA 108 (90 Base) MCG/ACT inhaler INHALE 2 PUFFS INTO THE LUNGS EVERY 6 HOURS AS NEEDED FOR WHEEZING OR SHORTNESS OF BREATH 18 g 5  . vitamin E 400 UNIT capsule Take 400 Units by mouth daily.    Marland Kitchen amLODipine (NORVASC) 5 MG tablet Take 1 tablet (5 mg total) by mouth daily.    . insulin glargine (LANTUS SOLOSTAR) 100 UNIT/ML Solostar Pen Inject 24 Units into the skin 2 (two) times daily.     Marland Kitchen lisinopril (PRINIVIL,ZESTRIL) 20 MG tablet Take 1 tablet (20 mg total) by mouth daily. 90 tablet 3  . metolazone (ZAROXOLYN) 2.5 MG tablet Take 1 tablet (2.5 mg total) by mouth daily as needed (for more than 3lb/day or 5lb/wk weight gain).    . potassium chloride (K-DUR,KLOR-CON) 10 MEQ tablet Take 10 mEq by mouth 2 (two) times daily.     . promethazine-dextromethorphan (PROMETHAZINE-DM) 6.25-15 MG/5ML syrup Take 5 mLs by mouth 4 (four) times daily as needed for cough. 118 mL 0  . sucralfate (CARAFATE) 1 g tablet TAKE 1 TABLET BY MOUTH TWICE (2) DAILY BEFORE A MEAL 60 tablet 1  . aspirin 81 MG chewable tablet Chew 81 mg by mouth daily.  (Patient not taking: Reported on 12/20/2019)    . citalopram (CELEXA) 20 MG tablet TAKE 1 TABLET BY MOUTH ONCE DAILY (Patient not taking: Reported on 12/20/2019) 90 tablet 1  . magnesium oxide (MAG-OX) 400 MG tablet Take 1 tablet by mouth 2 (two) times daily. (Patient not taking: Reported on 12/20/2019)    . metFORMIN (GLUCOPHAGE) 1000 MG tablet Take 0.5 tablets (500 mg total) by mouth 2 (two) times daily with a meal. (Patient not taking: Reported on 12/17/2019)    . metoprolol succinate (TOPROL-XL) 50 MG 24 hr tablet TAKE 1 TABLET BY MOUTH ONCE DAILY (Patient not taking: Reported on 12/20/2019) 90 tablet 1  . spironolactone (ALDACTONE) 25 MG tablet Take 1 tablet by mouth daily. (Patient not taking: Reported on  12/20/2019)     No facility-administered medications prior to visit.     Per HPI unless specifically indicated in ROS section below Review of Systems Objective:  BP 116/60 (BP Location: Right Arm, Patient Position: Sitting, Cuff Size: Large)   Pulse 87   Temp (!) 97.5 F (36.4 C) (Temporal)   Ht 5' 9"  (1.753 m)   Wt 258 lb 2 oz (117.1 kg)   SpO2 97%   BMI 38.12 kg/m   Wt Readings from Last 3 Encounters:  12/20/19 258 lb 2 oz (117.1 kg)  12/17/19 262 lb 12.8 oz (119.2 kg)  11/13/19 257 lb 1 oz (116.6 kg)      Physical Exam Vitals and nursing note reviewed.  Constitutional:      Appearance: Normal appearance. He is not ill-appearing.  Cardiovascular:     Rate and Rhythm: Normal rate and regular rhythm.     Pulses: Normal pulses.     Heart sounds: Normal heart sounds. No murmur heard.   Pulmonary:     Effort: Pulmonary effort is normal. No respiratory distress.     Breath sounds: Normal breath sounds. No wheezing, rhonchi or rales.  Musculoskeletal:     Right lower leg: Edema (tr) present.     Left lower leg: Edema (tr) present.  Skin:    General: Skin is warm and dry.     Findings: No rash.  Neurological:     Mental Status: He is alert.  Psychiatric:        Mood and Affect: Mood normal.        Behavior: Behavior normal.       Assessment & Plan:  This visit occurred during the SARS-CoV-2 public health emergency.  Safety protocols were in place, including screening questions prior to the visit, additional usage of staff PPE, and extensive cleaning of exam room while observing appropriate contact time as indicated for disinfecting solutions.   Problem List Items Addressed This Visit    Type 2 diabetes mellitus, uncontrolled, with retinopathy ([place])    cbg's largely well controlled at home, and lantus was recently stopped. He was continuing to have diarrhea on metformin 524m bid dosing. Will continue farxiga and ozempic, advised restart metformin 5031monce daily and  hold for diarrhea. Low threshold to stop metformin.       Shortness of breath    Has good days and bad days - more recently good days outnumber bad days with recent marked weight loss.       Severe obesity (BMI 35.0-39.9) with comorbidity ([facility]   Weight loss seems to be stabilizing.  Presumed ozempic related.  Other workup for weight loss returned reassuring.       Hyperlipidemia associated with type 2 diabetes mellitus ([facility]   He had stopped atorvastatin - advised restart this.       HTN (hypertension) (Chronic)    He restarted bumex upon discharge, however stays off lisinopril, amlodipine, toprol XL and spironolactone. BP stable. Advised monitor BPs and notify usKoreaf rising, check with cards regarding antihypertensive regimen (in h/o dCHF).       GERD (gastroesophageal reflux disease)    He continues dexilant and famotidine for severe GERD.       Diarrhea    Resolved off metformin. Will trial low dose metformin.       Chronic diastolic CHF (congestive heart failure) (HCC)    bumex was held on discharge, however he has restarted bumex due to recently noted increased edema.  Other medications held included metoprolol spironolactone, lisinopril. I asked him to check with cardiology on these medications, and monitor blood pressures. He has been off all of these for the past week and BP is stable today.       Chest pain - Primary    Recent hospitalization for this, largely reassuring evaluation. Thought possibly from overtreatment of hypertension and hyperglycemia. See above for de-escalation plan.           No orders of the defined types were placed in this encounter.  No orders of the defined types were placed in this encounter.  Patient Instructions  You haven't been taking amlodipine and lisinopril.  Ok to stay off magnesium.  We will stay off insulin but continue weekly shots.  Restart metformin at 1/2 pill daily (total of 535m once daily) - watch for return of  diarrhea.  Check with heart doctor about metoprolol, spironolactone, and amlodipine and lisinopril.  Continue other medicines for now. Med list updated.  Keep an eye on blood pressure and sugars, let uKoreaknow if staying too low.   Follow up plan: Return if symptoms worsen or fail to improve.  JRia Bush MD

## 2019-12-22 NOTE — Assessment & Plan Note (Signed)
cbg's largely well controlled at home, and lantus was recently stopped. He was continuing to have diarrhea on metformin 572m bid dosing. Will continue farxiga and ozempic, advised restart metformin 505monce daily and hold for diarrhea. Low threshold to stop metformin.

## 2019-12-22 NOTE — Assessment & Plan Note (Signed)
He continues dexilant and famotidine for severe GERD.

## 2019-12-22 NOTE — Assessment & Plan Note (Signed)
Resolved off metformin. Will trial low dose metformin.

## 2019-12-22 NOTE — Assessment & Plan Note (Addendum)
bumex was held on discharge, however he has restarted bumex due to recently noted increased edema.  Other medications held included metoprolol spironolactone, lisinopril. I asked him to check with cardiology on these medications, and monitor blood pressures. He has been off all of these for the past week and BP is stable today.

## 2019-12-22 NOTE — Assessment & Plan Note (Signed)
Recent hospitalization for this, largely reassuring evaluation. Thought possibly from overtreatment of hypertension and hyperglycemia. See above for de-escalation plan.

## 2019-12-22 NOTE — Assessment & Plan Note (Signed)
He restarted bumex upon discharge, however stays off lisinopril, amlodipine, toprol XL and spironolactone. BP stable. Advised monitor BPs and notify us if rising, check with cards regarding antihypertensive regimen (in h/o dCHF).

## 2019-12-22 NOTE — Assessment & Plan Note (Signed)
Has good days and bad days - more recently good days outnumber bad days with recent marked weight loss.

## 2019-12-22 NOTE — Assessment & Plan Note (Signed)
Weight loss seems to be stabilizing.  Presumed ozempic related.  Other workup for weight loss returned reassuring.

## 2019-12-22 NOTE — Assessment & Plan Note (Signed)
He had stopped atorvastatin - advised restart this.

## 2019-12-24 ENCOUNTER — Encounter: Payer: Medicare Other | Admitting: Internal Medicine

## 2019-12-24 NOTE — Telephone Encounter (Signed)
See 10/02/19 note from [place].

## 2019-12-26 ENCOUNTER — Other Ambulatory Visit: Payer: Self-pay | Admitting: Internal Medicine

## 2019-12-26 ENCOUNTER — Other Ambulatory Visit: Payer: Self-pay | Admitting: Family Medicine

## 2019-12-26 DIAGNOSIS — R933 Abnormal findings on diagnostic imaging of other parts of digestive tract: Secondary | ICD-10-CM

## 2019-12-26 DIAGNOSIS — R197 Diarrhea, unspecified: Secondary | ICD-10-CM

## 2019-12-26 DIAGNOSIS — R634 Abnormal weight loss: Secondary | ICD-10-CM

## 2019-12-26 NOTE — Telephone Encounter (Signed)
E-scribed refill.  Plz schedule wellness, cpe and lab visits.

## 2019-12-26 NOTE — Telephone Encounter (Signed)
02/17/2020 will be okay for him He was here in the [place] earlier this week for his wife's colonoscopy.  I was able to speak to him and he is feeling well and his wife corroborated this sentiment. His weight loss is likely most related to Ozempic but endoscopic evaluation is recommended This will need to be in the outpatient hospital setting due to his restrictive lung disease by recent pulmonary function testing Thank you

## 2019-12-26 NOTE — Telephone Encounter (Signed)
Patient has been scheduled for hospital endoscopy/colonoscopy with propofol at [facility] endoscopy on 02/17/20 at 915 am, 7:45 am arrival. He is also scheduled for covid testing on 02/13/20 at 10:30 am. Patient is to come for previsit for instructions at our office on 02/05/20 at 130 pm. I have left a message for patient to call back.

## 2019-12-27 NOTE — Telephone Encounter (Signed)
I have spoken to patient to advise regarding appointment times/dates/locations for endo/colon/previsit/covid testing. Patient verbalizes understanding.

## 2019-12-27 NOTE — Telephone Encounter (Signed)
Scheduled. EM

## 2020-01-07 DIAGNOSIS — I5032 Chronic diastolic (congestive) heart failure: Secondary | ICD-10-CM | POA: Diagnosis not present

## 2020-01-07 DIAGNOSIS — I10 Essential (primary) hypertension: Secondary | ICD-10-CM | POA: Diagnosis not present

## 2020-01-07 DIAGNOSIS — R0602 Shortness of breath: Secondary | ICD-10-CM | POA: Diagnosis not present

## 2020-01-07 DIAGNOSIS — I5030 Unspecified diastolic (congestive) heart failure: Secondary | ICD-10-CM | POA: Diagnosis not present

## 2020-01-07 DIAGNOSIS — I959 Hypotension, unspecified: Secondary | ICD-10-CM | POA: Diagnosis not present

## 2020-01-07 DIAGNOSIS — E119 Type 2 diabetes mellitus without complications: Secondary | ICD-10-CM | POA: Diagnosis not present

## 2020-01-07 DIAGNOSIS — R55 Syncope and collapse: Secondary | ICD-10-CM | POA: Diagnosis not present

## 2020-01-07 DIAGNOSIS — E785 Hyperlipidemia, unspecified: Secondary | ICD-10-CM | POA: Diagnosis not present

## 2020-01-07 DIAGNOSIS — F329 Major depressive disorder, single episode, unspecified: Secondary | ICD-10-CM | POA: Diagnosis not present

## 2020-01-07 DIAGNOSIS — I208 Other forms of angina pectoris: Secondary | ICD-10-CM | POA: Diagnosis not present

## 2020-01-07 DIAGNOSIS — I25118 Atherosclerotic heart disease of native coronary artery with other forms of angina pectoris: Secondary | ICD-10-CM | POA: Diagnosis not present

## 2020-01-13 ENCOUNTER — Other Ambulatory Visit: Payer: Self-pay | Admitting: Family Medicine

## 2020-01-20 ENCOUNTER — Other Ambulatory Visit: Payer: Self-pay | Admitting: Family Medicine

## 2020-01-24 DIAGNOSIS — Z23 Encounter for immunization: Secondary | ICD-10-CM | POA: Diagnosis not present

## 2020-02-05 ENCOUNTER — Ambulatory Visit (AMBULATORY_SURGERY_CENTER): Payer: Self-pay

## 2020-02-05 ENCOUNTER — Other Ambulatory Visit: Payer: Self-pay

## 2020-02-05 VITALS — Ht 69.0 in | Wt 261.0 lb

## 2020-02-05 DIAGNOSIS — Z01818 Encounter for other preprocedural examination: Secondary | ICD-10-CM

## 2020-02-05 NOTE — Progress Notes (Signed)
No egg or soy allergy known to patient  No issues with past sedation with any surgeries or procedures No intubation problems in the past  No FH of Malignant Hyperthermia No diet pills per patient No home 02 use per patient  No blood thinners per patient  Pt denies issues with constipation  No A fib or A flutter  COVID 19 guidelines implemented in PV today with Pt and RN  Pt is fully vaccinated  for Covid  Coupon given to pt in PV today , Code to Pharmacy  COVID screening scheduled  Due to the COVID-19 pandemic we are asking patients to follow certain guidelines.  Pt aware of COVID protocols and [place] guidelines   Patient reports he already has the Plenvu prep at home;

## 2020-02-10 ENCOUNTER — Encounter (HOSPITAL_COMMUNITY): Payer: Self-pay | Admitting: Internal Medicine

## 2020-02-10 ENCOUNTER — Other Ambulatory Visit: Payer: Self-pay

## 2020-02-12 ENCOUNTER — Encounter: Payer: Self-pay | Admitting: Family Medicine

## 2020-02-12 ENCOUNTER — Other Ambulatory Visit: Payer: Self-pay

## 2020-02-12 ENCOUNTER — Ambulatory Visit (INDEPENDENT_AMBULATORY_CARE_PROVIDER_SITE_OTHER): Payer: Medicare Other | Admitting: Family Medicine

## 2020-02-12 VITALS — BP 122/64 | HR 74 | Temp 97.5°F | Ht 69.0 in | Wt 261.1 lb

## 2020-02-12 DIAGNOSIS — K7581 Nonalcoholic steatohepatitis (NASH): Secondary | ICD-10-CM | POA: Diagnosis not present

## 2020-02-12 DIAGNOSIS — I10 Essential (primary) hypertension: Secondary | ICD-10-CM

## 2020-02-12 DIAGNOSIS — E1165 Type 2 diabetes mellitus with hyperglycemia: Secondary | ICD-10-CM

## 2020-02-12 DIAGNOSIS — K7460 Unspecified cirrhosis of liver: Secondary | ICD-10-CM | POA: Diagnosis not present

## 2020-02-12 DIAGNOSIS — E11319 Type 2 diabetes mellitus with unspecified diabetic retinopathy without macular edema: Secondary | ICD-10-CM | POA: Diagnosis not present

## 2020-02-12 DIAGNOSIS — IMO0002 Reserved for concepts with insufficient information to code with codable children: Secondary | ICD-10-CM

## 2020-02-12 LAB — POCT GLYCOSYLATED HEMOGLOBIN (HGB A1C): Hemoglobin A1C: 5.3 % (ref 4.0–5.6)

## 2020-02-12 MED ORDER — VENTOLIN HFA 108 (90 BASE) MCG/ACT IN AERS
INHALATION_SPRAY | RESPIRATORY_TRACT | 5 refills | Status: DC
Start: 2020-02-12 — End: 2021-02-10

## 2020-02-12 NOTE — Progress Notes (Signed)
Patient ID: Chad [name], male    DOB: Jun 04, 1955, 64 y.o.   MRN: [id number]  This visit was conducted in person.  BP 122/64 (BP Location: Left Arm, Patient Position: Sitting, Cuff Size: Large)   Pulse 74   Temp (!) 97.5 F (36.4 C) (Temporal)   Ht 5' 9"  (1.753 m)   Wt 261 lb 1 oz (118.4 kg)   SpO2 98%   BMI 38.55 kg/m    CC: 3 mo f/u visit  Subjective:   HPI: Chad [name] is a 64 y.o. male presenting on 02/12/2020 for Follow-up (Here for 3 mo f/u.  Pt accompanied by wife, [name] Pulse- temp 97.7. )   Upcoming colonoscopy Monday ([facility]).   Saw [facility] cardiology last month, note reviewed - maintaining lower weight with benefit. HFpEF with h/o hypertension - BP staying well controlled only on spironolactone 63m daily, imdur 1221m60mg daily and bumex PRN weight gain (mostly daily). Continues participating in [employer]  Marked weight loss has helped - no further indigestion, no more significant chest discomfort (once in the past month related to stress).   DM - does regularly check sugars and brings log - fasting sugars 90-130s. Compliant with antihyperglycemic regimen which includes: farxiga 1020maily, ozempic 0.5mg63mekly, metformin 500mg51mnies low sugars or hypoglycemic symptoms. Denies paresthesias. Last diabetic eye exam DUE. Pneumovax: 2013, 2008. Prevnar: not due. Glucometer brand: accuchek. DSME: completed. Lab Results  Component Value Date   HGBA1C 5.3 02/12/2020   Diabetic Foot Exam - Simple   Simple Foot Form Diabetic Foot exam was performed with the following findings: Yes 02/12/2020 10:04 AM  Visual Inspection No deformities, no ulcerations, no other skin breakdown bilaterally: Yes Sensation Testing Intact to touch and monofilament testing bilaterally: Yes Pulse Check Posterior Tibialis and Dorsalis pulse intact bilaterally: Yes Comments 2+ DP bilaterally    Lab Results  Component Value Date   MICROALBUR <0.7 09/15/2015        Relevant past  medical, surgical, family and social history reviewed and updated as indicated. Interim medical history since our last visit reviewed. Allergies and medications reviewed and updated. Outpatient Medications Prior to Visit  Medication Sig Dispense Refill  . aspirin 81 MG chewable tablet Chew 81 mg by mouth daily.    . bumetanide (BUMEX) 2 MG tablet Take 2 mg by mouth daily. Additional  2 mg if gain 3 lbs    . citalopram (CELEXA) 20 MG tablet TAKE 1 TABLET BY MOUTH ONCE DAILY (Patient taking differently: Take 20 mg by mouth daily.) 90 tablet 1  . dapagliflozin propanediol (FARXIGA) 10 MG TABS tablet Take 10 mg by mouth daily before breakfast.    . DEXILANT 60 MG capsule TAKE 1 CAPSULE BY MOUTH ONCE DAILY (Patient taking differently: Take 60 mg by mouth daily.) 30 capsule 2  . diazepam (VALIUM) 5 MG tablet Take 1 tablet (5 mg total) by mouth every 12 (twelve) hours as needed for anxiety. 30 tablet 1  . famotidine (PEPCID) 20 MG tablet TAKE 1 TABLET BY MOUTH EVERY NIGHT AT BEDTIME (Patient taking differently: Take 20 mg by mouth daily.) 90 tablet 1  . ferrous sulfate 325 (65 FE) MG EC tablet Take 1 tablet (325 mg total) by mouth every other day. (Patient taking differently: Take 325 mg by mouth every Monday, Wednesday, and Friday.)    . fluticasone (FLONASE) 50 MCG/ACT nasal spray PLACE 2 SPRAYS INTO BOTH NOSTRILS DAILY (Patient taking differently: Place 2 sprays into both nostrils daily as  needed for allergies.) 16 g 2  . HYDROcodone-acetaminophen (NORCO/VICODIN) 5-325 MG tablet TAKE 1 TABLET BY MOUTH EVERY 6 HOURS AS NEEDED FOR MODERATE PAIN (Patient taking differently: Take 1 tablet by mouth every 6 (six) hours as needed for moderate pain.) 15 tablet 0  . isosorbide mononitrate (IMDUR) 60 MG 24 hr tablet TAKE 3 TABLETS BY MOUTH ONCE DAILY (Patient taking differently: Take 120 mg by mouth daily.) 90 tablet 5  . levothyroxine (SYNTHROID) 125 MCG tablet TAKE 1 TAB BY MOUTH ONCE DAILY. TAKE ON AN EMPTY  STOMACH WITH A GLASS OF WATER ATLEAST 30-60 MINUTES BEFORE BREAKFAST (Patient taking differently: Take 125 mcg by mouth daily before breakfast.) 30 tablet 2  . Magnesium 400 MG TABS Take 400 mg by mouth 2 (two) times daily.    . metFORMIN (GLUCOPHAGE) 1000 MG tablet Take 500 mg by mouth at bedtime.    . Multiple Vitamin (MULITIVITAMIN WITH MINERALS) TABS Take 1 tablet by mouth daily.    . nitroGLYCERIN (NITROSTAT) 0.4 MG SL tablet Place 1 tablet (0.4 mg total) under the tongue every 5 (five) minutes as needed for chest pain. 60 tablet 3  . OZEMPIC, 0.25 OR 0.5 MG/DOSE, 2 MG/1.5ML SOPN INJECT 0.5 MG INTO THE SKIN ONCE A WEEK AS DIRECTED (Patient taking differently: Inject 0.5 mg as directed every Monday.) 1.5 mL 5  . PEG-KCl-NaCl-NaSulf-Na Asc-C (PLENVU) 140 g SOLR Take 1 kit by mouth as directed. 1 each 0  . spironolactone (ALDACTONE) 25 MG tablet Take 25 mg by mouth daily.    . vitamin E 400 UNIT capsule Take 400 Units by mouth daily.    . VENTOLIN HFA 108 (90 Base) MCG/ACT inhaler INHALE 2 PUFFS INTO THE LUNGS EVERY 6 HOURS AS NEEDED FOR WHEEZING OR SHORTNESS OF BREATH (Patient taking differently: Inhale 2 puffs into the lungs every 6 (six) hours as needed for wheezing.) 18 g 5  . Insulin Pen Needle (ULTICARE MINI PEN NEEDLES) 31G X 6 MM MISC Need follow up scheduled in the next 1 to 2 months USE AS DIRECTED FOR LANTUS SOLOSTAR PEN 100 each 3   No facility-administered medications prior to visit.     Per HPI unless specifically indicated in ROS section below Review of Systems Objective:  BP 122/64 (BP Location: Left Arm, Patient Position: Sitting, Cuff Size: Large)   Pulse 74   Temp (!) 97.5 F (36.4 C) (Temporal)   Ht 5' 9"  (1.753 m)   Wt 261 lb 1 oz (118.4 kg)   SpO2 98%   BMI 38.55 kg/m   Wt Readings from Last 3 Encounters:  02/12/20 261 lb 1 oz (118.4 kg)  02/05/20 261 lb (118.4 kg)  12/20/19 258 lb 2 oz (117.1 kg)      Physical Exam Vitals and nursing note reviewed.   Constitutional:      General: He is not in acute distress.    Appearance: Normal appearance. He is well-developed and well-nourished. He is obese. He is not ill-appearing.  HENT:     Head: Normocephalic and atraumatic.     Mouth/Throat:     Mouth: Oropharynx is clear and moist.  Eyes:     General: No scleral icterus.    Extraocular Movements: Extraocular movements intact and EOM normal.     Conjunctiva/sclera: Conjunctivae normal.     Pupils: Pupils are equal, round, and reactive to light.  Cardiovascular:     Rate and Rhythm: Normal rate and regular rhythm.     Pulses: Normal pulses and intact  distal pulses.     Heart sounds: Normal heart sounds. No murmur heard.   Pulmonary:     Effort: Pulmonary effort is normal. No respiratory distress.     Breath sounds: Normal breath sounds. No wheezing, rhonchi or rales.  Musculoskeletal:        General: No edema.     Cervical back: Normal range of motion and neck supple.     Right lower leg: No edema.     Left lower leg: No edema.     Comments: See HPI for foot exam if done  Lymphadenopathy:     Cervical: No cervical adenopathy.  Skin:    General: Skin is warm and dry.     Findings: No rash.  Neurological:     Mental Status: He is alert.  Psychiatric:        Mood and Affect: Mood and affect and mood normal.        Behavior: Behavior normal.       Results for orders placed or performed in visit on 02/12/20  POCT glycosylated hemoglobin (Hb A1C)  Result Value Ref Range   Hemoglobin A1C 5.3 4.0 - 5.6 %   HbA1c POC (<> result, manual entry)     HbA1c, POC (prediabetic range)     HbA1c, POC (controlled diabetic range)     Lab Results  Component Value Date   WBC 6.5 10/02/2019   HGB 14.5 10/02/2019   HCT 44.5 10/02/2019   MCV 81.6 10/02/2019   PLT 167.0 10/02/2019   Assessment & Plan:  This visit occurred during the SARS-CoV-2 public health emergency.  Safety protocols were in place, including screening questions prior to  the visit, additional usage of staff PPE, and extensive cleaning of exam room while observing appropriate contact time as indicated for disinfecting solutions.   Problem List Items Addressed This Visit    Type 2 diabetes mellitus, uncontrolled, with retinopathy ([place]) - Primary    Chronic, stable on current regimen. With A1c 5.1%, will stop metformin and assess for remaining control. Continue farxiga (in h/o HFpEF) and ozempic (obesity).  They state UTD eye exam - will request records (they will let us know name of eye doctor)      Relevant Orders   POCT glycosylated hemoglobin (Hb A1C) (Completed)   Severe obesity (BMI 35.0-39.9) with comorbidity ([place])    Congratulated on maintaining weight loss.  Continue ozempic.  Weight loss workup earlier this year returned reassuring.       Liver cirrhosis secondary to NASH ([place]) (Chronic)    Confirmed by MRI 10/2019 with [facility].  Appreciate GI care.       HTN (hypertension) (Chronic)    Chronic, stable only on imdur, spironolactone and bumex PRN. Continue current regimen.           Meds ordered this encounter  Medications  . VENTOLIN HFA 108 (90 Base) MCG/ACT inhaler    Sig: INHALE 2 PUFFS INTO THE LUNGS EVERY 6 HOURS AS NEEDED FOR WHEEZING OR SHORTNESS OF BREATH    Dispense:  18 g    Refill:  5   Orders Placed This Encounter  Procedures  . POCT glycosylated hemoglobin (Hb A1C)    Patient Instructions  Trial off metformin for now. May restart if sugars trending up.  We will request records from eye doctor [facility]) - let us know who you saw. Schedule eye exam if you're due.  You are doing great! Keep up the good work.  Return in 6  months for wellness visit.   Follow up plan: Return in about 6 months (around 08/12/2020) for follow up visit.  [name], MD

## 2020-02-12 NOTE — Assessment & Plan Note (Signed)
Chronic, stable only on imdur, spironolactone and bumex PRN. Continue current regimen.

## 2020-02-12 NOTE — Assessment & Plan Note (Signed)
Chronic, stable on current regimen. With A1c 5.1%, will stop metformin and assess for remaining control. Continue farxiga (in h/o HFpEF) and ozempic (obesity).  They state UTD eye exam - will request records (they will let us know name of eye doctor)

## 2020-02-12 NOTE — Assessment & Plan Note (Signed)
Congratulated on maintaining weight loss.  Continue ozempic.  Weight loss workup earlier this year returned reassuring.

## 2020-02-12 NOTE — Patient Instructions (Addendum)
Trial off metformin for now. May restart if sugars trending up.  We will request records from eye doctor [facility]) - let us know who you saw. Schedule eye exam if you're due.  You are doing great! Keep up the good work.  Return in 6 months for wellness visit.

## 2020-02-12 NOTE — Assessment & Plan Note (Signed)
Confirmed by MRI 10/2019 with [facility].  Appreciate GI care.

## 2020-02-13 ENCOUNTER — Other Ambulatory Visit (HOSPITAL_COMMUNITY)
Admission: RE | Admit: 2020-02-13 | Discharge: 2020-02-13 | Disposition: A | Discharge status: Home / Self Care | Payer: Medicare Other | Source: Ambulatory Visit | Attending: Internal Medicine | Admitting: Internal Medicine

## 2020-02-13 DIAGNOSIS — Z20822 Contact with and (suspected) exposure to covid-19: Secondary | ICD-10-CM | POA: Diagnosis not present

## 2020-02-13 DIAGNOSIS — Z01812 Encounter for preprocedural laboratory examination: Secondary | ICD-10-CM | POA: Insufficient documentation

## 2020-02-13 LAB — SARS CORONAVIRUS 2 (TAT 6-24 HRS): SARS Coronavirus 2: NEGATIVE

## 2020-02-16 NOTE — Progress Notes (Signed)
Pre-call completed for additional screening for day before procedure.Ptdenies having signs/symptoms of Covid (fever, cold symptoms, body aches, etc.). Ptquarantined at home over the weekend and since negative result on 12/23.  Pt scheduled to arrive at 7:45 for 9:00 am procedure.  Procedure moved up one hour because of opening in schedule.  Endo RN advised pt to completed colon prep 1 hour earlier than instructed.  Pt verbalized understanding.

## 2020-02-17 ENCOUNTER — Ambulatory Visit (HOSPITAL_COMMUNITY): Payer: Medicare Other | Admitting: Anesthesiology

## 2020-02-17 ENCOUNTER — Other Ambulatory Visit: Payer: Self-pay

## 2020-02-17 ENCOUNTER — Encounter (HOSPITAL_COMMUNITY): Payer: Self-pay | Admitting: Internal Medicine

## 2020-02-17 ENCOUNTER — Encounter (HOSPITAL_COMMUNITY)
Admission: RE | Disposition: A | Discharge status: Home / Self Care | Payer: Self-pay | Source: Home / Self Care | Attending: Internal Medicine

## 2020-02-17 ENCOUNTER — Ambulatory Visit (HOSPITAL_COMMUNITY)
Admission: RE | Admit: 2020-02-17 | Discharge: 2020-02-17 | Disposition: A | Discharge status: Home / Self Care | Payer: Medicare Other | Attending: Internal Medicine | Admitting: Internal Medicine

## 2020-02-17 DIAGNOSIS — K760 Fatty (change of) liver, not elsewhere classified: Secondary | ICD-10-CM | POA: Diagnosis not present

## 2020-02-17 DIAGNOSIS — Z86010 Personal history of colonic polyps: Secondary | ICD-10-CM | POA: Diagnosis not present

## 2020-02-17 DIAGNOSIS — Z96653 Presence of artificial knee joint, bilateral: Secondary | ICD-10-CM | POA: Diagnosis not present

## 2020-02-17 DIAGNOSIS — K5730 Diverticulosis of large intestine without perforation or abscess without bleeding: Secondary | ICD-10-CM | POA: Diagnosis not present

## 2020-02-17 DIAGNOSIS — K5140 Inflammatory polyps of colon without complications: Secondary | ICD-10-CM | POA: Diagnosis not present

## 2020-02-17 DIAGNOSIS — K2950 Unspecified chronic gastritis without bleeding: Secondary | ICD-10-CM | POA: Insufficient documentation

## 2020-02-17 DIAGNOSIS — R1310 Dysphagia, unspecified: Secondary | ICD-10-CM | POA: Diagnosis not present

## 2020-02-17 DIAGNOSIS — K7460 Unspecified cirrhosis of liver: Secondary | ICD-10-CM | POA: Diagnosis not present

## 2020-02-17 DIAGNOSIS — D122 Benign neoplasm of ascending colon: Secondary | ICD-10-CM

## 2020-02-17 DIAGNOSIS — R634 Abnormal weight loss: Secondary | ICD-10-CM | POA: Diagnosis not present

## 2020-02-17 DIAGNOSIS — K635 Polyp of colon: Secondary | ICD-10-CM | POA: Diagnosis not present

## 2020-02-17 DIAGNOSIS — Z9884 Bariatric surgery status: Secondary | ICD-10-CM | POA: Diagnosis not present

## 2020-02-17 DIAGNOSIS — R197 Diarrhea, unspecified: Secondary | ICD-10-CM

## 2020-02-17 DIAGNOSIS — Z8616 Personal history of COVID-19: Secondary | ICD-10-CM | POA: Insufficient documentation

## 2020-02-17 DIAGNOSIS — R933 Abnormal findings on diagnostic imaging of other parts of digestive tract: Secondary | ICD-10-CM

## 2020-02-17 DIAGNOSIS — K648 Other hemorrhoids: Secondary | ICD-10-CM | POA: Diagnosis not present

## 2020-02-17 DIAGNOSIS — K634 Enteroptosis: Secondary | ICD-10-CM | POA: Diagnosis not present

## 2020-02-17 DIAGNOSIS — Z6837 Body mass index (BMI) 37.0-37.9, adult: Secondary | ICD-10-CM | POA: Diagnosis not present

## 2020-02-17 HISTORY — PX: ESOPHAGOGASTRODUODENOSCOPY (EGD) WITH PROPOFOL: SHX5813

## 2020-02-17 HISTORY — PX: COLONOSCOPY WITH PROPOFOL: SHX5780

## 2020-02-17 HISTORY — PX: POLYPECTOMY: SHX5525

## 2020-02-17 HISTORY — PX: BIOPSY: SHX5522

## 2020-02-17 LAB — GLUCOSE, CAPILLARY: Glucose-Capillary: 72 mg/dL (ref 70–99)

## 2020-02-17 SURGERY — ESOPHAGOGASTRODUODENOSCOPY (EGD) WITH PROPOFOL
Anesthesia: Monitor Anesthesia Care

## 2020-02-17 MED ORDER — LIDOCAINE 2% (20 MG/ML) 5 ML SYRINGE
INTRAMUSCULAR | Status: DC | PRN
  Administered 2020-02-17: 100 mg via INTRAVENOUS

## 2020-02-17 MED ORDER — LACTATED RINGERS IV SOLN: INTRAVENOUS | Status: DC | PRN

## 2020-02-17 MED ORDER — PROPOFOL 10 MG/ML IV BOLUS
INTRAVENOUS | Status: AC
  Filled 2020-02-17: qty 20

## 2020-02-17 MED ORDER — PROPOFOL 500 MG/50ML IV EMUL
INTRAVENOUS | Status: DC | PRN
  Administered 2020-02-17: 125 ug/kg/min via INTRAVENOUS

## 2020-02-17 MED ORDER — SODIUM CHLORIDE 0.9 % IV SOLN: INTRAVENOUS | Status: DC

## 2020-02-17 MED ORDER — LACTATED RINGERS IV SOLN: Freq: Once | INTRAVENOUS | Status: AC

## 2020-02-17 MED ORDER — PROPOFOL 10 MG/ML IV BOLUS
INTRAVENOUS | Status: DC | PRN
  Administered 2020-02-17 (×6): 20 mg via INTRAVENOUS

## 2020-02-17 SURGICAL SUPPLY — 25 items

## 2020-02-17 NOTE — Transfer of Care (Signed)
Immediate Anesthesia Transfer of Care Note  Patient: Chad [name]  Procedure(s) Performed: ESOPHAGOGASTRODUODENOSCOPY (EGD) WITH PROPOFOL (N/A ) COLONOSCOPY WITH PROPOFOL (N/A ) BIOPSY POLYPECTOMY  Patient Location: Endoscopy Unit  Anesthesia Type:MAC  Level of Consciousness: drowsy  Airway & Oxygen Therapy: Patient Spontanous Breathing and Patient connected to face mask oxygen  Post-op Assessment: Report given to RN and Post -op Vital signs reviewed and stable  Post vital signs: Reviewed and stable  Last Vitals:  Vitals Value Taken Time  BP    Temp    Pulse 73 02/17/20 1012  Resp    SpO2 100 % 02/17/20 1012  Vitals shown include unvalidated device data.  Last Pain:  Vitals:   02/17/20 0827  PainSc: 0-No pain         Complications: No complications documented.

## 2020-02-17 NOTE — Anesthesia Preprocedure Evaluation (Signed)
Anesthesia Evaluation  Patient identified by MRN, date of birth, ID band Patient awake    Reviewed: Allergy & Precautions, H&P , NPO status , Patient's Chart, lab work & pertinent test results  Airway Mallampati: II  TM Distance: >3 FB Neck ROM: Full    Dental no notable dental hx.    Pulmonary asthma , COPD,  COPD inhaler,    breath sounds clear to auscultation + decreased breath sounds      Cardiovascular hypertension, Normal cardiovascular exam Rhythm:Regular Rate:Normal     Neuro/Psych negative neurological ROS  negative psych ROS   GI/Hepatic GERD  ,  Endo/Other  diabetesHypothyroidism   Renal/GU negative Renal ROS  negative genitourinary   Musculoskeletal negative musculoskeletal ROS (+)   Abdominal   Peds negative pediatric ROS (+)  Hematology negative hematology ROS (+)   Anesthesia Other Findings   Reproductive/Obstetrics negative OB ROS                             Anesthesia Physical Anesthesia Plan  ASA: III  Anesthesia Plan: MAC   Post-op Pain Management:    Induction: Intravenous  PONV Risk Score and Plan: 0  Airway Management Planned: Simple Face Mask  Additional Equipment:   Intra-op Plan:   Post-operative Plan:   Informed Consent: I have reviewed the patients History and Physical, chart, labs and discussed the procedure including the risks, benefits and alternatives for the proposed anesthesia with the patient or authorized representative who has indicated his/her understanding and acceptance.     Dental advisory given  Plan Discussed with: CRNA and Surgeon  Anesthesia Plan Comments:         Anesthesia Quick Evaluation

## 2020-02-17 NOTE — Anesthesia Postprocedure Evaluation (Signed)
Anesthesia Post Note  Patient: Chad [name]  Procedure(s) Performed: ESOPHAGOGASTRODUODENOSCOPY (EGD) WITH PROPOFOL (N/A ) COLONOSCOPY WITH PROPOFOL (N/A ) BIOPSY POLYPECTOMY     Patient location during evaluation: PACU Anesthesia Type: MAC Level of consciousness: awake and alert Pain management: pain level controlled Vital Signs Assessment: post-procedure vital signs reviewed and stable Respiratory status: spontaneous breathing, nonlabored ventilation, respiratory function stable and patient connected to nasal cannula oxygen Cardiovascular status: stable and blood pressure returned to baseline Postop Assessment: no apparent nausea or vomiting Anesthetic complications: no   No complications documented.  Last Vitals:  Vitals:   02/17/20 0827 02/17/20 1015  BP: (!) 140/53 (!) 118/52  Pulse: 74 73  Resp: (!) 8 19  Temp: (!) 36 C 36.4 C  SpO2: 98% 100%    Last Pain:  Vitals:   02/17/20 1015  TempSrc: Axillary  PainSc: 0-No pain                 Yurika Pereda S
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
## 2020-02-17 NOTE — Op Note (Addendum)
[facility] Patient Name: Chad [name] Procedure Date: 02/17/2020 MRN: [id number] Attending MD: Jerene Bears , MD Date of Birth: 1955/04/24 CSN: [id number] Age: 64 Admit Type: Outpatient Procedure:                Upper GI endoscopy Indications:              Dysphagia symptom (to pills), barium esophagram                            abnormal with displacement of cervical esophagus                            (confirmed by CT to be due to vertebral                            osteophytes) and transit delay of barium tablet at                            GEJ; weight loss Providers:                Lajuan Lines. Hilarie Fredrickson, MD, Cleda Daub, RN, Laverda Sorenson, Technician, Danley Danker, CRNA Referring MD:             [name] Medicines:                Monitored Anesthesia Care Complications:            No immediate complications. Estimated Blood Loss:     Estimated blood loss was minimal. Procedure:                Pre-Anesthesia Assessment:                           - Prior to the procedure, a History and Physical                            was performed, and patient medications and                            allergies were reviewed. The patient's tolerance of                            previous anesthesia was also reviewed. The risks                            and benefits of the procedure and the sedation                            options and risks were discussed with the patient.                            All questions were answered, and informed consent  was obtained. Prior Anticoagulants: The patient has                            taken no previous anticoagulant or antiplatelet                            agents. ASA Grade Assessment: III - A patient with                            severe systemic disease. After reviewing the risks                            and benefits, the patient was deemed in                             satisfactory condition to undergo the procedure.                           After obtaining informed consent, the endoscope was                            passed under direct vision. Throughout the                            procedure, the patient's blood pressure, pulse, and                            oxygen saturations were monitored continuously. The                            GIF-H190 ([id number]) Olympus gastroscope was                            introduced through the mouth, and advanced to the                            second part of duodenum. The upper GI endoscopy was                            accomplished without difficulty. The patient                            tolerated the procedure well. Scope In: Scope Out: Findings:      The examined esophagus was normal. There is no evidence of esophageal       stricture or mass. No esophagitis.      There is no endoscopic evidence of varices at the gastroesophageal       junction and in the entire esophagus.      Evidence of probable gastric bypass was found in the cardia and in the       incisura. As previously seen there is a large fistulous opening       connecting the gastric cardia to the incisura/antrum. This is widely       patent and the endoscope passes easily through this  aberrant lumen into       the antrum. This was characterized by healthy appearing mucosa. Biopsies       were taken with a cold forceps for histology and Helicobacter pylori       testing (body, antrum, incisura, aberrant lumen). This proximal       connection is just distal the GEJ and it is likely this anatomy produces       intermittent pill dysphagia and the barium tablet delay seen on the       esophagram. Dilation would not be expected to improve dysphagia symptom       as there is no stricture.      The exam of the stomach was otherwise normal.      The examined duodenum was normal. Impression:               - Normal esophagus.                            - No evidence of esophageal or gastric varices.                           - Aberrant lumen (as described above) found                            connecting the gastric cardia immediately distal to                            the GEJ to the gastric incisura/antrum,                            characterized by healthy appearing mucosa.                            Biopsied. Likely related to prior gastric bypass                            (though certainly not traditional).                           - Normal examined duodenum. Moderate Sedation:      N/A Recommendation:           - Patient has a contact number available for                            emergencies. The signs and symptoms of potential                            delayed complications were discussed with the                            patient. Return to normal activities tomorrow.                            Written discharge instructions were provided to the  patient.                           - Resume previous diet.                           - Continue present medications.                           - Await pathology results.                           - Repeat EGD in 2 years for variceal screening.                           - Repeat MRI abdomen with and without contrast for                            [facility] screening in Sept 2022.                           - See the other procedure note for documentation of                            additional recommendations. Procedure Code(s):        --- Professional ---                           [id number], Esophagogastroduodenoscopy, flexible,                            transoral; with biopsy, single or multiple Diagnosis Code(s):        --- Professional ---                           Z98.84, Bariatric surgery status                           R13.10, Dysphagia, unspecified CPT copyright 2019 [employer]. All rights reserved. The codes documented in this  report are preliminary and upon coder review may  be revised to meet current compliance requirements. Jerene Bears, MD 02/17/2020 10:20:11 AM This report has been signed electronically. Number of Addenda: 0

## 2020-02-17 NOTE — Op Note (Signed)
[facility] Patient Name: Chad [name] Procedure Date: 02/17/2020 MRN: [id number] Attending MD: Jerene Bears , MD Date of Birth: 07-15-1955 CSN: [id number] Age: 64 Admit Type: Outpatient Procedure:                Colonoscopy Indications:              Clinically significant diarrhea of unexplained                            origin, Weight loss, personal history of                            adenomatous polyps in 2016 (last colonoscopy 2017                            without adenomatous polyps) Providers:                Lajuan Lines. Hilarie Fredrickson, MD, Cleda Daub, RN, Laverda Sorenson, Technician, Danley Danker, CRNA Referring MD:             [name] Medicines:                Monitored Anesthesia Care Complications:            No immediate complications. Estimated Blood Loss:     Estimated blood loss was minimal. Procedure:                Pre-Anesthesia Assessment:                           - Prior to the procedure, a History and Physical                            was performed, and patient medications and                            allergies were reviewed. The patient's tolerance of                            previous anesthesia was also reviewed. The risks                            and benefits of the procedure and the sedation                            options and risks were discussed with the patient.                            All questions were answered, and informed consent                            was obtained. Prior Anticoagulants: The patient has  taken no previous anticoagulant or antiplatelet                            agents. ASA Grade Assessment: III - A patient with                            severe systemic disease. After reviewing the risks                            and benefits, the patient was deemed in                            satisfactory condition to undergo the procedure.                            After obtaining informed consent, the colonoscope                            was passed under direct vision. Throughout the                            procedure, the patient's blood pressure, pulse, and                            oxygen saturations were monitored continuously. The                            CF-HQ190L ([id number]) Olympus colonoscope was                            introduced through the anus and advanced to the                            cecum, identified by appendiceal orifice and                            ileocecal valve. The colonoscopy was performed                            without difficulty. The patient tolerated the                            procedure well. The quality of the bowel                            preparation was excellent. The ileocecal valve,                            appendiceal orifice, and rectum were photographed. Scope In: 9:45:48 AM Scope Out: 10:02:22 AM Scope Withdrawal Time: 0 hours 14 minutes 0 seconds  Total Procedure Duration: 0 hours 16 minutes 34 seconds  Findings:      The digital rectal exam was normal.      Two sessile polyps were found in the ascending colon. The polyps were  2       to 5 mm in size. These polyps were removed with a cold snare. Resection       and retrieval were complete.      A few small-mouthed diverticula were found in the sigmoid colon.      Otherwise, normal mucosa was found in the entire colon. Biopsies for       histology were taken with a cold forceps from the right colon and left       colon for evaluation of microscopic colitis.      Internal hemorrhoids were found during retroflexion. The hemorrhoids       were small. Impression:               - Two 2 to 5 mm polyps in the ascending colon,                            removed with a cold snare. Resected and retrieved.                           - Diverticulosis in the sigmoid colon.                           - Otherwise normal mucosa in the entire  examined                            colon. Biopsied to exclude microscopic colitis.                           - Small internal hemorrhoids. Moderate Sedation:      N/A Recommendation:           - Patient has a contact number available for                            emergencies. The signs and symptoms of potential                            delayed complications were discussed with the                            patient. Return to normal activities tomorrow.                            Written discharge instructions were provided to the                            patient.                           - Resume previous diet.                           - Continue present medications.                           - Await pathology results.                           -  Repeat colonoscopy is recommended for                            surveillance. The colonoscopy date will be                            determined after pathology results from today's                            exam become available for review. Procedure Code(s):        --- Professional ---                           [phone], Colonoscopy, flexible; with removal of                            tumor(s), polyp(s), or other lesion(s) by snare                            technique                           45380, 40, Colonoscopy, flexible; with biopsy,                            single or multiple Diagnosis Code(s):        --- Professional ---                           K63.5, Polyp of colon                           K64.8, Other hemorrhoids                           R19.7, Diarrhea, unspecified                           R63.4, Abnormal weight loss                           K57.30, Diverticulosis of large intestine without                            perforation or abscess without bleeding CPT copyright 2019 [employer]. All rights reserved. The codes documented in this report are preliminary and upon coder review may  be  revised to meet current compliance requirements. Jerene Bears, MD 02/17/2020 10:28:09 AM This report has been signed electronically. Number of Addenda: 0

## 2020-02-17 NOTE — Discharge Instructions (Signed)
Colonoscopy, Adult, Care After This sheet gives you information about how to care for yourself after your procedure. Your doctor may also give you more specific instructions. If you have problems or questions, call your doctor. What can I expect after the procedure? After the procedure, it is common to have:  A small amount of blood in your poop (stool) for 24 hours.  Some gas.  Mild cramping or bloating in your belly (abdomen). Follow these instructions at home: Eating and drinking   Drink enough fluid to keep your pee (urine) pale yellow.  Follow instructions from your doctor about what you cannot eat or drink.  Return to your normal diet as told by your doctor. Avoid heavy or fried foods that are hard to digest. Activity  Rest as told by your doctor.  Do not sit for a long time without moving. Get up to take short walks every 1-2 hours. This is important. Ask for help if you feel weak or unsteady.  Return to your normal activities as told by your doctor. Ask your doctor what activities are safe for you. To help cramping and bloating:   Try walking around.  Put heat on your belly as told by your doctor. Use the heat source that your doctor recommends, such as a moist heat pack or a heating pad. ? Put a towel between your skin and the heat source. ? Leave the heat on for 20-30 minutes. ? Remove the heat if your skin turns bright red. This is very important if you are unable to feel pain, heat, or cold. You may have a greater risk of getting burned. General instructions  For the first 24 hours after the procedure: ? Do not drive or use machinery. ? Do not sign important documents. ? Do not drink alcohol. ? Do your daily activities more slowly than normal. ? Eat foods that are soft and easy to digest.  Take over-the-counter or prescription medicines only as told by your doctor.  Keep all follow-up visits as told by your doctor. This is important. Contact a doctor  if:  You have blood in your poop 2-3 days after the procedure. Get help right away if:  You have more than a small amount of blood in your poop.  You see large clumps of tissue (blood clots) in your poop.  Your belly is swollen.  You feel like you may vomit (nauseous).  You vomit.  You have a fever.  You have belly pain that gets worse, and medicine does not help your pain. Summary  After the procedure, it is common to have a small amount of blood in your poop. You may also have mild cramping and bloating in your belly.  For the first 24 hours after the procedure, do not drive or use machinery, do not sign important documents, and do not drink alcohol.  Get help right away if you have a lot of blood in your poop, feel like you may vomit, have a fever, or have more belly pain. This information is not intended to replace advice given to you by your health care provider. Make sure you discuss any questions you have with your health care provider. Document Revised: 09/03/2018 Document Reviewed: 09/03/2018 Elsevier Patient Education  [place].

## 2020-02-18 ENCOUNTER — Other Ambulatory Visit: Payer: Self-pay

## 2020-02-18 ENCOUNTER — Encounter (HOSPITAL_COMMUNITY): Payer: Self-pay | Admitting: Internal Medicine

## 2020-02-18 ENCOUNTER — Encounter: Payer: Self-pay | Admitting: Internal Medicine

## 2020-02-18 LAB — SURGICAL PATHOLOGY

## 2020-02-21 ENCOUNTER — Encounter: Payer: Self-pay | Admitting: Family Medicine

## 2020-03-05 ENCOUNTER — Other Ambulatory Visit: Payer: Self-pay | Admitting: Family Medicine

## 2020-03-05 ENCOUNTER — Telehealth: Payer: Self-pay | Admitting: *Deleted

## 2020-03-05 NOTE — Telephone Encounter (Signed)
[facility] called needing clarification on pt's isosorbide. Old directions said take 2 tabs in am and 1 tab in pm so #90 tabs would be a 1 month supply. Rx was sent in today by outside [place] and directions are changed, new Rx only says take 2 tabs am, but it still says #90 tabs. [facility] said either the #90 tabs needs to be changed to #60 if directions have changed to only 2 tabs daily, or if the new Rx's directions are wrong it needs to be changed back to original directions. Either way they said PCP can just send in new Rx electronically with correct directions or # of pills  Will route to PCP and CMA for review

## 2020-03-06 ENCOUNTER — Other Ambulatory Visit: Payer: Self-pay | Admitting: Family Medicine

## 2020-03-06 MED ORDER — ISOSORBIDE MONONITRATE ER 60 MG PO TB24: ORAL_TABLET | ORAL | 6 refills | Status: DC

## 2020-03-06 NOTE — Telephone Encounter (Signed)
Name of Medication: Hydrocodone-APAP Name of Pharmacy: [facility] or Written Date and Quantity: 11/26/19, #15 Last Office Visit and Type: 02/12/20, 3 mo f/u Next Office Visit and Type: 03/18/20, AWV prt 2 Last Controlled Substance Agreement Date: 04/03/15 Last UDS: 04/03/15

## 2020-03-06 NOTE — Telephone Encounter (Signed)
Sent in Rx per latest cards note.

## 2020-03-07 NOTE — Telephone Encounter (Signed)
ERx 

## 2020-03-10 NOTE — Telephone Encounter (Signed)
Noted  

## 2020-03-11 ENCOUNTER — Other Ambulatory Visit: Payer: Self-pay | Admitting: Family Medicine

## 2020-03-11 ENCOUNTER — Ambulatory Visit (INDEPENDENT_AMBULATORY_CARE_PROVIDER_SITE_OTHER): Payer: Medicare Other

## 2020-03-11 DIAGNOSIS — Z0000 Encounter for general adult medical examination without abnormal findings: Secondary | ICD-10-CM | POA: Diagnosis not present

## 2020-03-11 DIAGNOSIS — IMO0002 Reserved for concepts with insufficient information to code with codable children: Secondary | ICD-10-CM

## 2020-03-11 DIAGNOSIS — K7460 Unspecified cirrhosis of liver: Secondary | ICD-10-CM

## 2020-03-11 DIAGNOSIS — E611 Iron deficiency: Secondary | ICD-10-CM

## 2020-03-11 DIAGNOSIS — Z125 Encounter for screening for malignant neoplasm of prostate: Secondary | ICD-10-CM

## 2020-03-11 DIAGNOSIS — E785 Hyperlipidemia, unspecified: Secondary | ICD-10-CM

## 2020-03-11 DIAGNOSIS — E039 Hypothyroidism, unspecified: Secondary | ICD-10-CM

## 2020-03-11 DIAGNOSIS — K7581 Nonalcoholic steatohepatitis (NASH): Secondary | ICD-10-CM

## 2020-03-11 DIAGNOSIS — E1169 Type 2 diabetes mellitus with other specified complication: Secondary | ICD-10-CM

## 2020-03-11 DIAGNOSIS — E11319 Type 2 diabetes mellitus with unspecified diabetic retinopathy without macular edema: Secondary | ICD-10-CM

## 2020-03-11 DIAGNOSIS — Z9884 Bariatric surgery status: Secondary | ICD-10-CM

## 2020-03-11 DIAGNOSIS — K911 Postgastric surgery syndromes: Secondary | ICD-10-CM

## 2020-03-11 NOTE — Progress Notes (Signed)
PCP notes:  Health Maintenance: Prevnar 13- due   Abnormal Screenings: none   Patient concerns: none   Nurse concerns: none   Next PCP appt.: 03/18/2020 @ 2:30 pm
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
## 2020-03-11 NOTE — Patient Instructions (Signed)
Chad [name] , Thank you for taking time to come for your Medicare Wellness Visit. I appreciate your ongoing commitment to your health goals. Please review the following plan we discussed and let me know if I can assist you in the future.   Screening recommendations/referrals: Colonoscopy: Up to date, completed 02/17/2020, due 01/2025 Recommended yearly ophthalmology/optometry visit for glaucoma screening and checkup Recommended yearly dental visit for hygiene and checkup  Vaccinations: Influenza vaccine: Up to date, completed 11/13/2019, due 09/2020 Pneumococcal vaccine: due, will get at upcoming physical  Tdap vaccine: Up to date, completed 01/31/2011, due 01/2021 Shingles vaccine: Completed series   Covid-19: Completed series  Advanced directives: Advance directive discussed with you today. Even though you declined this today please call our office should you change your mind and we can give you the proper paperwork for you to fill out.  Conditions/risks identified: Diabetes  Next appointment: Follow up in one year for your annual wellness visit.   Preventive Care 65 Years and Older, Male Preventive care refers to lifestyle choices and visits with your health care provider that can promote health and wellness. What does preventive care include?  A yearly physical exam. This is also called an annual well check.  Dental exams once or twice a year.  Routine eye exams. Ask your health care provider how often you should have your eyes checked.  Personal lifestyle choices, including:  Daily care of your teeth and gums.  Regular physical activity.  Eating a healthy diet.  Avoiding tobacco and drug use.  Limiting alcohol use.  Practicing safe sex.  Taking low doses of aspirin every day.  Taking vitamin and mineral supplements as recommended by your health care provider. What happens during an annual well check? The services and screenings done by your health care provider  during your annual well check will depend on your age, overall health, lifestyle risk factors, and family history of disease. Counseling  Your health care provider may ask you questions about your:  Alcohol use.  Tobacco use.  Drug use.  Emotional well-being.  Home and relationship well-being.  Sexual activity.  Eating habits.  History of falls.  Memory and ability to understand (cognition).  Work and work Statistician. Screening  You may have the following tests or measurements:  Height, weight, and BMI.  Blood pressure.  Lipid and cholesterol levels. These may be checked every 5 years, or more frequently if you are over 35 years old.  Skin check.  Lung cancer screening. You may have this screening every year starting at age 65 if you have a 30-pack-year history of smoking and currently smoke or have quit within the past 15 years.  Fecal occult blood test (FOBT) of the stool. You may have this test every year starting at age 55.  Flexible sigmoidoscopy or colonoscopy. You may have a sigmoidoscopy every 5 years or a colonoscopy every 10 years starting at age 65.  Prostate cancer screening. Recommendations will vary depending on your family history and other risks.  Hepatitis C blood test.  Hepatitis B blood test.  Sexually transmitted disease (STD) testing.  Diabetes screening. This is done by checking your blood sugar (glucose) after you have not eaten for a while (fasting). You may have this done every 1-3 years.  Abdominal aortic aneurysm (AAA) screening. You may need this if you are a current or former smoker.  Osteoporosis. You may be screened starting at age 53 if you are at high risk. Talk with your health care  provider about your test results, treatment options, and if necessary, the need for more tests. Vaccines  Your health care provider may recommend certain vaccines, such as:  Influenza vaccine. This is recommended every year.  Tetanus,  diphtheria, and acellular pertussis (Tdap, Td) vaccine. You may need a Td booster every 10 years.  Zoster vaccine. You may need this after age 65.  Pneumococcal 13-valent conjugate (PCV13) vaccine. One dose is recommended after age 65.  Pneumococcal polysaccharide (PPSV23) vaccine. One dose is recommended after age 65. Talk to your health care provider about which screenings and vaccines you need and how often you need them. This information is not intended to replace advice given to you by your health care provider. Make sure you discuss any questions you have with your health care provider. Document Released: 03/06/2015 Document Revised: 10/28/2015 Document Reviewed: 12/09/2014 Elsevier Interactive Patient Education  2017 [employer] Prevention in the Home Falls can cause injuries. They can happen to people of all ages. There are many things you can do to make your home safe and to help prevent falls. What can I do on the outside of my home?  Regularly fix the edges of walkways and driveways and fix any cracks.  Remove anything that might make you trip as you walk through a door, such as a raised step or threshold.  Trim any bushes or trees on the path to your home.  Use bright outdoor lighting.  Clear any walking paths of anything that might make someone trip, such as rocks or tools.  Regularly check to see if handrails are loose or broken. Make sure that both sides of any steps have handrails.  Any raised decks and porches should have guardrails on the edges.  Have any leaves, snow, or ice cleared regularly.  Use sand or salt on walking paths during winter.  Clean up any spills in your garage right away. This includes oil or grease spills. What can I do in the bathroom?  Use night lights.  Install grab bars by the toilet and in the tub and shower. Do not use towel bars as grab bars.  Use non-skid mats or decals in the tub or shower.  If you need to sit down in  the shower, use a plastic, non-slip stool.  Keep the floor dry. Clean up any water that spills on the floor as soon as it happens.  Remove soap buildup in the tub or shower regularly.  Attach bath mats securely with double-sided non-slip rug tape.  Do not have throw rugs and other things on the floor that can make you trip. What can I do in the bedroom?  Use night lights.  Make sure that you have a light by your bed that is easy to reach.  Do not use any sheets or blankets that are too big for your bed. They should not hang down onto the floor.  Have a firm chair that has side arms. You can use this for support while you get dressed.  Do not have throw rugs and other things on the floor that can make you trip. What can I do in the kitchen?  Clean up any spills right away.  Avoid walking on wet floors.  Keep items that you use a lot in easy-to-reach places.  If you need to reach something above you, use a strong step stool that has a grab bar.  Keep electrical cords out of the way.  Do not use floor polish  or wax that makes floors slippery. If you must use wax, use non-skid floor wax.  Do not have throw rugs and other things on the floor that can make you trip. What can I do with my stairs?  Do not leave any items on the stairs.  Make sure that there are handrails on both sides of the stairs and use them. Fix handrails that are broken or loose. Make sure that handrails are as long as the stairways.  Check any carpeting to make sure that it is firmly attached to the stairs. Fix any carpet that is loose or worn.  Avoid having throw rugs at the top or bottom of the stairs. If you do have throw rugs, attach them to the floor with carpet tape.  Make sure that you have a light switch at the top of the stairs and the bottom of the stairs. If you do not have them, ask someone to add them for you. What else can I do to help prevent falls?  Wear shoes that:  Do not have high  heels.  Have rubber bottoms.  Are comfortable and fit you well.  Are closed at the toe. Do not wear sandals.  If you use a stepladder:  Make sure that it is fully opened. Do not climb a closed stepladder.  Make sure that both sides of the stepladder are locked into place.  Ask someone to hold it for you, if possible.  Clearly mark and make sure that you can see:  Any grab bars or handrails.  First and last steps.  Where the edge of each step is.  Use tools that help you move around (mobility aids) if they are needed. These include:  Canes.  Walkers.  Scooters.  Crutches.  Turn on the lights when you go into a dark area. Replace any light bulbs as soon as they burn out.  Set up your furniture so you have a clear path. Avoid moving your furniture around.  If any of your floors are uneven, fix them.  If there are any pets around you, be aware of where they are.  Review your medicines with your doctor. Some medicines can make you feel dizzy. This can increase your chance of falling. Ask your doctor what other things that you can do to help prevent falls. This information is not intended to replace advice given to you by your health care provider. Make sure you discuss any questions you have with your health care provider. Document Released: 12/04/2008 Document Revised: 07/16/2015 Document Reviewed: 03/14/2014 Elsevier Interactive Patient Education  2017 [employer].

## 2020-03-12 ENCOUNTER — Other Ambulatory Visit: Payer: Medicare Other

## 2020-03-12 ENCOUNTER — Other Ambulatory Visit: Payer: Self-pay

## 2020-03-12 ENCOUNTER — Other Ambulatory Visit (INDEPENDENT_AMBULATORY_CARE_PROVIDER_SITE_OTHER): Payer: Medicare Other

## 2020-03-12 DIAGNOSIS — E611 Iron deficiency: Secondary | ICD-10-CM

## 2020-03-12 DIAGNOSIS — E1169 Type 2 diabetes mellitus with other specified complication: Secondary | ICD-10-CM | POA: Diagnosis not present

## 2020-03-12 DIAGNOSIS — E039 Hypothyroidism, unspecified: Secondary | ICD-10-CM | POA: Diagnosis not present

## 2020-03-12 DIAGNOSIS — Z125 Encounter for screening for malignant neoplasm of prostate: Secondary | ICD-10-CM | POA: Diagnosis not present

## 2020-03-12 DIAGNOSIS — K911 Postgastric surgery syndromes: Secondary | ICD-10-CM

## 2020-03-12 DIAGNOSIS — Z9884 Bariatric surgery status: Secondary | ICD-10-CM | POA: Diagnosis not present

## 2020-03-12 DIAGNOSIS — K7460 Unspecified cirrhosis of liver: Secondary | ICD-10-CM

## 2020-03-12 DIAGNOSIS — E1165 Type 2 diabetes mellitus with hyperglycemia: Secondary | ICD-10-CM

## 2020-03-12 DIAGNOSIS — R7989 Other specified abnormal findings of blood chemistry: Secondary | ICD-10-CM

## 2020-03-12 DIAGNOSIS — E785 Hyperlipidemia, unspecified: Secondary | ICD-10-CM

## 2020-03-12 DIAGNOSIS — IMO0002 Reserved for concepts with insufficient information to code with codable children: Secondary | ICD-10-CM

## 2020-03-12 DIAGNOSIS — K7581 Nonalcoholic steatohepatitis (NASH): Secondary | ICD-10-CM | POA: Diagnosis not present

## 2020-03-12 DIAGNOSIS — E11319 Type 2 diabetes mellitus with unspecified diabetic retinopathy without macular edema: Secondary | ICD-10-CM | POA: Diagnosis not present

## 2020-03-12 LAB — COMPREHENSIVE METABOLIC PANEL
ALT: 30 U/L (ref 0–53)
AST: 37 U/L (ref 0–37)
Albumin: 4.4 g/dL (ref 3.5–5.2)
Alkaline Phosphatase: 225 U/L — ABNORMAL HIGH (ref 39–117)
BUN: 15 mg/dL (ref 6–23)
CO2: 32 mEq/L (ref 19–32)
Calcium: 9.3 mg/dL (ref 8.4–10.5)
Chloride: 98 mEq/L (ref 96–112)
Creatinine, Ser: 0.95 mg/dL (ref 0.40–1.50)
GFR: 84.27 mL/min (ref >60.00)
Glucose, Bld: 111 mg/dL — ABNORMAL HIGH (ref 70–99)
Potassium: 3.4 mEq/L — ABNORMAL LOW (ref 3.5–5.1)
Sodium: 138 mEq/L (ref 135–145)
Total Bilirubin: 1.4 mg/dL — ABNORMAL HIGH (ref 0.2–1.2)
Total Protein: 6.3 g/dL (ref 6.0–8.3)

## 2020-03-12 LAB — HEMOGLOBIN A1C: Hgb A1c MFr Bld: 5.7 % (ref 4.6–6.5)

## 2020-03-12 LAB — VITAMIN D 25 HYDROXY (VIT D DEFICIENCY, FRACTURES): VITD: 38.52 ng/mL (ref 30.00–100.00)

## 2020-03-12 LAB — IBC PANEL
Iron: 67 ug/dL (ref 42–165)
Saturation Ratios: 18.2 % — ABNORMAL LOW (ref 20.0–50.0)
Transferrin: 263 mg/dL (ref 212.0–360.0)

## 2020-03-12 LAB — MICROALBUMIN / CREATININE URINE RATIO
Creatinine,U: 49.2 mg/dL
Microalb Creat Ratio: 1.4 mg/g (ref 0.0–30.0)
Microalb, Ur: <0.7 mg/dL (ref 0.0–1.9)

## 2020-03-12 LAB — CBC WITH DIFFERENTIAL/PLATELET
Basophils Absolute: 0 10*3/uL (ref 0.0–0.1)
Basophils Relative: 0.3 % (ref 0.0–3.0)
Eosinophils Absolute: 0.1 10*3/uL (ref 0.0–0.7)
Eosinophils Relative: 1.6 % (ref 0.0–5.0)
HCT: 44.6 % (ref 39.0–52.0)
Hemoglobin: 14.9 g/dL (ref 13.0–17.0)
Lymphocytes Relative: 13.5 % (ref 12.0–46.0)
Lymphs Abs: 0.6 10*3/uL — ABNORMAL LOW (ref 0.7–4.0)
MCHC: 33.4 g/dL (ref 30.0–36.0)
MCV: 81.8 fl (ref 78.0–100.0)
Monocytes Absolute: 0.4 10*3/uL (ref 0.1–1.0)
Monocytes Relative: 9.5 % (ref 3.0–12.0)
Neutro Abs: 3.5 10*3/uL (ref 1.4–7.7)
Neutrophils Relative %: 75.1 % (ref 43.0–77.0)
Platelets: 142 10*3/uL — ABNORMAL LOW (ref 150.0–400.0)
RBC: 5.45 Mil/uL (ref 4.22–5.81)
RDW: 14.6 % (ref 11.5–15.5)
WBC: 4.7 10*3/uL (ref 4.0–10.5)

## 2020-03-12 LAB — T4, FREE: Free T4: 0.86 ng/dL (ref 0.60–1.60)

## 2020-03-12 LAB — FERRITIN: Ferritin: 18.8 ng/mL — ABNORMAL LOW (ref 22.0–322.0)

## 2020-03-12 LAB — LIPID PANEL
Cholesterol: 124 mg/dL (ref 0–200)
HDL: 49.1 mg/dL (ref >39.00)
LDL Cholesterol: 55 mg/dL (ref 0–99)
NonHDL: 74.74
Total CHOL/HDL Ratio: 3
Triglycerides: 99 mg/dL (ref 0.0–149.0)
VLDL: 19.8 mg/dL (ref 0.0–40.0)

## 2020-03-12 LAB — FOLATE: Folate: >23.6 ng/mL (ref >5.9)

## 2020-03-12 LAB — PSA, MEDICARE: PSA: 1.21 ng/ml (ref 0.10–4.00)

## 2020-03-12 LAB — TSH: TSH: 5.84 u[IU]/mL — ABNORMAL HIGH (ref 0.35–4.50)

## 2020-03-12 LAB — VITAMIN B12: Vitamin B-12: 362 pg/mL (ref 211–911)

## 2020-03-12 NOTE — Addendum Note (Signed)
Addended by: Ellamae Sia on: 03/12/2020 03:24 PM   Modules accepted: Orders

## 2020-03-17 LAB — VITAMIN B1: Vitamin B1 (Thiamine): 23 nmol/L (ref 8–30)

## 2020-03-18 ENCOUNTER — Ambulatory Visit (INDEPENDENT_AMBULATORY_CARE_PROVIDER_SITE_OTHER): Payer: Medicare Other | Admitting: Family Medicine

## 2020-03-18 ENCOUNTER — Encounter: Payer: Self-pay | Admitting: Family Medicine

## 2020-03-18 ENCOUNTER — Other Ambulatory Visit: Payer: Self-pay

## 2020-03-18 VITALS — BP 118/52 | HR 71 | Temp 98.0°F | Ht 70.0 in | Wt 263.3 lb

## 2020-03-18 DIAGNOSIS — Z23 Encounter for immunization: Secondary | ICD-10-CM | POA: Diagnosis not present

## 2020-03-18 DIAGNOSIS — R634 Abnormal weight loss: Secondary | ICD-10-CM

## 2020-03-18 DIAGNOSIS — K7581 Nonalcoholic steatohepatitis (NASH): Secondary | ICD-10-CM | POA: Diagnosis not present

## 2020-03-18 DIAGNOSIS — I10 Essential (primary) hypertension: Secondary | ICD-10-CM

## 2020-03-18 DIAGNOSIS — E785 Hyperlipidemia, unspecified: Secondary | ICD-10-CM

## 2020-03-18 DIAGNOSIS — E1142 Type 2 diabetes mellitus with diabetic polyneuropathy: Secondary | ICD-10-CM

## 2020-03-18 DIAGNOSIS — E1169 Type 2 diabetes mellitus with other specified complication: Secondary | ICD-10-CM | POA: Diagnosis not present

## 2020-03-18 DIAGNOSIS — Z789 Other specified health status: Secondary | ICD-10-CM

## 2020-03-18 DIAGNOSIS — R0789 Other chest pain: Secondary | ICD-10-CM | POA: Diagnosis not present

## 2020-03-18 DIAGNOSIS — E039 Hypothyroidism, unspecified: Secondary | ICD-10-CM

## 2020-03-18 DIAGNOSIS — R748 Abnormal levels of other serum enzymes: Secondary | ICD-10-CM | POA: Diagnosis not present

## 2020-03-18 DIAGNOSIS — I5032 Chronic diastolic (congestive) heart failure: Secondary | ICD-10-CM | POA: Diagnosis not present

## 2020-03-18 DIAGNOSIS — E113559 Type 2 diabetes mellitus with stable proliferative diabetic retinopathy, unspecified eye: Secondary | ICD-10-CM | POA: Diagnosis not present

## 2020-03-18 DIAGNOSIS — I440 Atrioventricular block, first degree: Secondary | ICD-10-CM

## 2020-03-18 DIAGNOSIS — K219 Gastro-esophageal reflux disease without esophagitis: Secondary | ICD-10-CM

## 2020-03-18 DIAGNOSIS — K7460 Unspecified cirrhosis of liver: Secondary | ICD-10-CM

## 2020-03-18 DIAGNOSIS — H34812 Central retinal vein occlusion, left eye, with macular edema: Secondary | ICD-10-CM

## 2020-03-18 DIAGNOSIS — E611 Iron deficiency: Secondary | ICD-10-CM

## 2020-03-18 DIAGNOSIS — H9193 Unspecified hearing loss, bilateral: Secondary | ICD-10-CM

## 2020-03-18 DIAGNOSIS — Z9884 Bariatric surgery status: Secondary | ICD-10-CM

## 2020-03-18 DIAGNOSIS — D649 Anemia, unspecified: Secondary | ICD-10-CM

## 2020-03-18 DIAGNOSIS — I700 Atherosclerosis of aorta: Secondary | ICD-10-CM

## 2020-03-18 NOTE — Patient Instructions (Addendum)
Prevnar-13 today  Start iron pill daily. Start vitamin b12 551mg daily. Labs today to check liver.  Good to see you today.  Return in 6 months for follow up visit   Health Maintenance After Age 2689After age 226 you are at a higher risk for certain long-term diseases and infections as well as injuries from falls. Falls are a major cause of broken bones and head injuries in people who are older than age 270 Getting regular preventive care can help to keep you healthy and well. Preventive care includes getting regular testing and making lifestyle changes as recommended by your health care provider. Talk with your health care provider about:  Which screenings and tests you should have. A screening is a test that checks for a disease when you have no symptoms.  A diet and exercise plan that is right for you. What should I know about screenings and tests to prevent falls? Screening and testing are the best ways to find a health problem early. Early diagnosis and treatment give you the best chance of managing medical conditions that are common after age 238 Certain conditions and lifestyle choices may make you more likely to have a fall. Your health care provider may recommend:  Regular vision checks. Poor vision and conditions such as cataracts can make you more likely to have a fall. If you wear glasses, make sure to get your prescription updated if your vision changes.  Medicine review. Work with your health care provider to regularly review all of the medicines you are taking, including over-the-counter medicines. Ask your health care provider about any side effects that may make you more likely to have a fall. Tell your health care provider if any medicines that you take make you feel dizzy or sleepy.  Osteoporosis screening. Osteoporosis is a condition that causes the bones to get weaker. This can make the bones weak and cause them to break more easily.  Blood pressure screening. Blood pressure  changes and medicines to control blood pressure can make you feel dizzy.  Strength and balance checks. Your health care provider may recommend certain tests to check your strength and balance while standing, walking, or changing positions.  Foot health exam. Foot pain and numbness, as well as not wearing proper footwear, can make you more likely to have a fall.  Depression screening. You may be more likely to have a fall if you have a fear of falling, feel emotionally low, or feel unable to do activities that you used to do.  Alcohol use screening. Using too much alcohol can affect your balance and may make you more likely to have a fall. What actions can I take to lower my risk of falls? General instructions  Talk with your health care provider about your risks for falling. Tell your health care provider if: ? You fall. Be sure to tell your health care provider about all falls, even ones that seem minor. ? You feel dizzy, sleepy, or off-balance.  Take over-the-counter and prescription medicines only as told by your health care provider. These include any supplements.  Eat a healthy diet and maintain a healthy weight. A healthy diet includes low-fat dairy products, low-fat (lean) meats, and fiber from whole grains, beans, and lots of fruits and vegetables. Home safety  Remove any tripping hazards, such as rugs, cords, and clutter.  Install safety equipment such as grab bars in bathrooms and safety rails on stairs.  Keep rooms and walkways well-lit. Activity  Follow a  regular exercise program to stay fit. This will help you maintain your balance. Ask your health care provider what types of exercise are appropriate for you.  If you need a cane or walker, use it as recommended by your health care provider.  Wear supportive shoes that have nonskid soles.   Lifestyle  Do not drink alcohol if your health care provider tells you not to drink.  If you drink alcohol, limit how much you  have: ? 0-1 drink a day for women. ? 0-2 drinks a day for men.  Be aware of how much alcohol is in your drink. In the [place] one drink equals one typical bottle of beer (12 oz), one-half glass of wine (5 oz), or one shot of hard liquor (1 oz).  Do not use any products that contain nicotine or tobacco, such as cigarettes and e-cigarettes. If you need help quitting, ask your health care provider. Summary  Having a healthy lifestyle and getting preventive care can help to protect your health and wellness after age 35.  Screening and testing are the best way to find a health problem early and help you avoid having a fall. Early diagnosis and treatment give you the best chance for managing medical conditions that are more common for people who are older than age 54.  Falls are a major cause of broken bones and head injuries in people who are older than age 15. Take precautions to prevent a fall at home.  Work with your health care provider to learn what changes you can make to improve your health and wellness and to prevent falls. This information is not intended to replace advice given to you by your health care provider. Make sure you discuss any questions you have with your health care provider. Document Revised: 05/31/2018 Document Reviewed: 12/21/2016 Elsevier Patient Education  2021 [employer].

## 2020-03-18 NOTE — Progress Notes (Addendum)
Patient ID: Chad [name], male    DOB: 1955/10/15, 65 y.o.   MRN: [id number]  This visit was conducted in person.  BP (!) 118/52 (BP Location: Right Arm, Patient Position: Sitting, Cuff Size: Normal)    Pulse 71    Temp 98 F (36.7 C) (Temporal)    Ht 5' 10"  (1.778 m)    Wt 263 lb 5 oz (119.4 kg)    SpO2 97%    BMI 37.78 kg/m    CC: AMW f/u visit  Subjective:   HPI: Chad [name] is a 65 y.o. male presenting on 03/18/2020 for Annual Exam (Prt 2.  Pt accompanied by wife, [name] Pulse- temp 97.9.) and Chest Pain (C/o right side rib pain, improving.  Pt fell about 2 wks ago.  Thx he has rib fx.  Did have pain with deep breathing.  Area is still sore to touch.)   Saw health advisor last week for medicare wellness visit. Note reviewed.   No exam data present  Flowsheet Row Clinical Support from 03/11/2020 in [place] at [facility] Total Score 0      Fall Risk  03/11/2020 11/21/2018 11/16/2017 05/15/2017 12/19/2016  Falls in the past year? 1 1 Yes No No  Comment - - "passed out" in yard - -  Number falls in past yr: 0 1 1 - -  Injury with Fall? 1 1 No - -  Comment broke ribs neck injury - - -  Risk for fall due to : Medication side effect History of fall(s);Impaired balance/gait;Impaired mobility;Medication side effect - - -  Risk for fall due to: Comment - - - - -  Follow up Falls evaluation completed;Falls prevention discussed Falls evaluation completed;Falls prevention discussed - - -  Fall 2 wks ago while helping daughter move - fell off trailer and landed on R lateral ribcage - doesn't think he fractured rib. He managed pain with hydrocodone with benefit. Overall imroving.   Little dog was killed this month.  Mother broke her hip recently.  Truck broke down.  Tough start to 2022. Otherwise feeling well. Denies recent chest pain or indigestion - symptoms well controlled on dexilant daily.   DM - well controlled cbgs 90-100s.   Preventative: COLONOSCOPY Date: 12/2014  TAs, mod diverticulosis, rpt 3 yrs (Pyrtle) COLONOSCOPY 08/2015 - HP, diverticulosis (Pyrtle) COLONOSCOPY WITH PROPOFOL 02/17/2020 - inflammatory polyp ([place]le, Lajuan Lines, MD) ESOPHAGOGASTRODUODENOSCOPY (EGD) WITH PROPOFOL 02/17/2020 - chronic gastritis, neg H pylori ([place]le, Lajuan Lines, MD) [place] yearly  Lung cancer screening - not eligible Flu shot yearly  [place] 03/2019, 04/2019, 01/2020 Td- 01/2011  Pneumovax2008, 2013 Prevnar today  Shingrix- 11/2018, 06/2019 Discussed advanced directives, doesn't think would want prolonged life support, but will think about this.Would want wife to be HCPOA. Packet previously provided. Would want ventilation support only if he gets coronavirus infection.  Seat belt use discussed Sunscreen use discussed.Just saw dermatology.  Non smoker Alcohol - none Dentist - yearly Eye exam yearly - 2021. L vision loss due to high blood presssure. 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 Avoiding fried foods.     Relevant past medical, surgical, family and social history reviewed and updated as indicated. Interim medical history  since our last visit reviewed. Allergies and medications reviewed and updated. Outpatient Medications Prior to Visit  Medication Sig Dispense Refill   aspirin 81 MG chewable tablet Chew 81 mg by mouth daily.     bumetanide (BUMEX) 2 MG tablet Take 2 mg by mouth daily. Additional  2 mg if gain 3 lbs     citalopram (CELEXA) 20 MG tablet TAKE 1 TABLET BY MOUTH ONCE DAILY (Patient taking differently: Take 20 mg by mouth daily.) 90 tablet 1   dapagliflozin propanediol (FARXIGA) 10 MG TABS tablet Take 10 mg by mouth daily before breakfast.     DEXILANT  60 MG capsule TAKE 1 CAPSULE BY MOUTH ONCE DAILY (Patient taking differently: Take 60 mg by mouth daily.) 30 capsule 2   diazepam (VALIUM) 5 MG tablet Take 1 tablet (5 mg total) by mouth every 12 (twelve) hours as needed for anxiety. 30 tablet 1   famotidine (PEPCID) 20 MG tablet TAKE 1 TABLET BY MOUTH EVERY NIGHT AT BEDTIME (Patient taking differently: Take 20 mg by mouth daily.) 90 tablet 1   fluticasone (FLONASE) 50 MCG/ACT nasal spray PLACE 2 SPRAYS INTO BOTH NOSTRILS DAILY (Patient taking differently: Place 2 sprays into both nostrils daily as needed for allergies.) 16 g 2   HYDROcodone-acetaminophen (NORCO/VICODIN) 5-325 MG tablet Take 1 tablet by mouth every 6 (six) hours as needed for moderate pain. 15 tablet 0   isosorbide mononitrate (IMDUR) 60 MG 24 hr tablet Take 2 tablets (120 mg total) by mouth daily AND 1 tablet (60 mg total) at bedtime. 90 tablet 6   levothyroxine (SYNTHROID) 125 MCG tablet TAKE 1 TAB BY MOUTH ONCE DAILY. TAKE ON AN EMPTY STOMACH WITH A GLASS OF WATER ATLEAST 30-60 MINUTES BEFORE BREAKFAST (Patient taking differently: Take 125 mcg by mouth daily before breakfast.) 30 tablet 2   Magnesium 400 MG TABS Take 400 mg by mouth 2 (two) times daily.     Multiple Vitamin (MULITIVITAMIN WITH MINERALS) TABS Take 1 tablet by mouth daily.     nitroGLYCERIN (NITROSTAT) 0.4 MG SL tablet Place 1 tablet (0.4 mg total) under the tongue every 5 (five) minutes as needed for chest pain. 60 tablet 3   OZEMPIC, 0.25 OR 0.5 MG/DOSE, 2 MG/1.5ML SOPN INJECT 0.5 MG INTO THE SKIN ONCE A WEEK AS DIRECTED (Patient taking differently: Inject 0.5 mg as directed every Monday.) 1.5 mL 5   spironolactone (ALDACTONE) 25 MG tablet Take 25 mg by mouth daily.     VENTOLIN HFA 108 (90 Base) MCG/ACT inhaler INHALE 2 PUFFS INTO THE LUNGS EVERY 6 HOURS AS NEEDED FOR WHEEZING OR SHORTNESS OF BREATH 18 g 5   vitamin E 400 UNIT capsule Take 400 Units by mouth daily.     ferrous sulfate 325 (65 FE) MG  EC tablet Take 1 tablet (325 mg total) by mouth every other day. (Patient taking differently: Take 325 mg by mouth every Monday, Wednesday, and Friday.)     metFORMIN (GLUCOPHAGE) 1000 MG tablet Take 500 mg by mouth at bedtime. (Patient not taking: Reported on 03/11/2020)     No facility-administered medications prior to visit.     Per HPI unless specifically indicated in ROS section below Review of Systems Objective:  BP (!) 118/52 (BP Location: Right Arm, Patient Position: Sitting, Cuff Size: Normal)    Pulse 71    Temp 98 F (36.7 C) (Temporal)    Ht 5' 10"  (1.778 m)    Wt 263 lb 5 oz (119.4 kg)  SpO2 97%    BMI 37.78 kg/m   Wt Readings from Last 3 Encounters:  03/18/20 263 lb 5 oz (119.4 kg)  02/17/20 253 lb (114.8 kg)  02/12/20 261 lb 1 oz (118.4 kg)      Physical Exam Vitals and nursing note reviewed.  Constitutional:      General: He is not in acute distress.    Appearance: Normal appearance. He is well-developed and well-nourished. He is not ill-appearing.  HENT:     Head: Normocephalic and atraumatic.     Right Ear: Hearing, tympanic membrane, ear canal and external ear normal.     Left Ear: Hearing, tympanic membrane, ear canal and external ear normal.     Mouth/Throat:     Mouth: Oropharynx is clear and moist and mucous membranes are normal.     Pharynx: No posterior oropharyngeal edema.  Eyes:     General: No scleral icterus.    Extraocular Movements: Extraocular movements intact and EOM normal.     Conjunctiva/sclera: Conjunctivae normal.     Pupils: Pupils are equal, round, and reactive to light.  Neck:     Thyroid: No thyroid mass or thyromegaly.     Vascular: No carotid bruit.  Cardiovascular:     Rate and Rhythm: Normal rate and regular rhythm.     Pulses: Normal pulses and intact distal pulses.          Radial pulses are 2+ on the right side and 2+ on the left side.     Heart sounds: Normal heart sounds. No murmur heard.   Pulmonary:     Effort:  Pulmonary effort is normal. No respiratory distress.     Breath sounds: Normal breath sounds. No wheezing, rhonchi or rales.     Comments: Discomfort to palpation along inferior anterior and lateral ribcage  Chest:     Chest wall: Tenderness present.  Abdominal:     General: Abdomen is flat. Bowel sounds are normal. There is no distension.     Palpations: Abdomen is soft. There is no mass.     Tenderness: There is no abdominal tenderness. There is no guarding or rebound.     Hernia: No hernia is present.  Musculoskeletal:        General: No edema. Normal range of motion.     Cervical back: Normal range of motion and neck supple.     Right lower leg: No edema.     Left lower leg: No edema.  Lymphadenopathy:     Cervical: No cervical adenopathy.  Skin:    General: Skin is warm and dry.     Findings: No rash.  Neurological:     General: No focal deficit present.     Mental Status: He is alert and oriented to person, place, and time.     Comments: CN grossly intact, station and gait intact  Psychiatric:        Mood and Affect: Mood and affect and mood normal.        Behavior: Behavior normal.        Thought Content: Thought content normal.        Judgment: Judgment normal.       Results for orders placed or performed in visit on 03/18/20  Alkaline Phosphatase, Isoenzymes  Result Value Ref Range   Alkaline Phosphatase 287 (H) 44 - 121 IU/L   LIVER FRACTION WILL FOLLOW    BONE FRACTION WILL FOLLOW    INTESTINAL FRAC. WILL FOLLOW  Lab Results  Component Value Date   HGBA1C 5.7 03/12/2020    Lab Results  Component Value Date   CHOL 124 03/12/2020   HDL 49.10 03/12/2020   LDLCALC 55 03/12/2020   LDLDIRECT 73.0 10/26/2017   TRIG 99.0 03/12/2020   CHOLHDL 3 03/12/2020    Lab Results  Component Value Date   TSH 5.84 (H) 03/12/2020    Assessment & Plan:  This visit occurred during the SARS-CoV-2 public health emergency.  Safety protocols were in place, including  screening questions prior to the visit, additional usage of staff PPE, and extensive cleaning of exam room while observing appropriate contact time as indicated for disinfecting solutions.   Problem List Items Addressed This Visit    Unintended weight loss    Weight has stabilized, recently weight gain noted. Weight today 263lbs      Statin intolerance    All statins previously tried have caused cramping/myalgias.      Severe obesity (BMI 35.0-39.9) with comorbidity ([place])    Discussed weight gain noted - he is planning to take extra  bumex today.       Liver cirrhosis secondary to NASH (HCC) (Chronic)    Stable period, sees [facility]. Recent EGD 01/2020 without esophageal varices. plt low but stable.       Iron deficiency    Iron stores remain low - increase oral iron to daily dosing.       Hypothyroidism    TSH elevated but denies hypothyroid symptoms. Continue current dose of levothyroxine.       Hyperlipidemia associated with type 2 diabetes mellitus (HCC)    Statin intolerance, not on RYR, cholesterol levels controlled at this time.  The ASCVD Risk score Mikey Bussing DC Jr., et al., 2013) failed to calculate for the following reasons:   The patient has a prior MI or stroke diagnosis       HTN (hypertension) (Chronic)    Chronic, stable period on current regimen - continue bumex, imdur, spironolactone.       GERD (gastroesophageal reflux disease)    Continues daily dexilant + pepcid with good effect, h/o severe GERD with gastritis on recent EGD despite meds.       Elevated alkaline phosphatase level - Primary    ?Cirrhosis related - check fractionated alk phos levels today.       Relevant Orders   Alkaline Phosphatase, Isoenzymes (Completed)   Diabetic neuropathy associated with type 2 diabetes mellitus ([place])    Stable to improved.       Controlled type 2 diabetes mellitus with retinopathy (HCC)    Chronic, great control on current regimen. Continue ozempic, farxiga.  Remains off insulin, off metformin.       Chronic diastolic CHF (congestive heart failure) ([place])    Appreciate [facility] cards care - followed by [facility] program.       Central retinal vein occlusion with macular edema of left eye    Residual L vision loss.       Bilateral hearing loss   Bariatric surgery status   Atherosclerosis of aorta (HCC)    Continue aspirin. Statin intolerance.       Anterior chest wall pain    Anticipate rib contusion, overall healing well. Supportive care recommended. No dyspnea or significant pleurisy.       Anemia    Anemia has resolved.       Relevant Medications   ferrous sulfate 325 (65 FE) MG EC tablet   vitamin B-12 (CYANOCOBALAMIN) 500  MCG tablet   1st degree AV block    Stay off beta blocker at this time.        Other Visit Diagnoses    Need for vaccination with 13-polyvalent pneumococcal conjugate vaccine       Relevant Orders   Pneumococcal conjugate vaccine 13-valent IM (Completed)       Meds ordered this encounter  Medications   ferrous sulfate 325 (65 FE) MG EC tablet    Sig: Take 1 tablet (325 mg total) by mouth daily with breakfast.   vitamin B-12 (CYANOCOBALAMIN) 500 MCG tablet    Sig: Take 1 tablet (500 mcg total) by mouth daily.   Orders Placed This Encounter  Procedures   Pneumococcal conjugate vaccine 13-valent IM   Alkaline Phosphatase, Isoenzymes    Patient instructions: [id number] today  Start iron pill daily. Start vitamin b12 574mg daily. Labs today to check liver.  Good to see you today.  Return in 6 months for follow up visit   Follow up plan: Return in about 6 months (around 09/15/2020) for follow up visit.  JRia Bush MD

## 2020-03-19 DIAGNOSIS — R0789 Other chest pain: Secondary | ICD-10-CM | POA: Insufficient documentation

## 2020-03-19 DIAGNOSIS — Z789 Other specified health status: Secondary | ICD-10-CM | POA: Insufficient documentation

## 2020-03-19 DIAGNOSIS — R748 Abnormal levels of other serum enzymes: Secondary | ICD-10-CM | POA: Insufficient documentation

## 2020-03-19 MED ORDER — CYANOCOBALAMIN 500 MCG PO TABS: 500.0000 ug | ORAL_TABLET | Freq: Every day | ORAL | Status: AC

## 2020-03-19 MED ORDER — FERROUS SULFATE 325 (65 FE) MG PO TBEC: 325.0000 mg | DELAYED_RELEASE_TABLET | Freq: Every day | ORAL | Status: DC

## 2020-03-19 NOTE — Assessment & Plan Note (Signed)
Stay off beta blocker at this time.

## 2020-03-19 NOTE — Assessment & Plan Note (Signed)
Residual L vision loss.

## 2020-03-19 NOTE — Assessment & Plan Note (Signed)
Iron stores remain low - increase oral iron to daily dosing.

## 2020-03-19 NOTE — Assessment & Plan Note (Signed)
Weight has stabilized, recently weight gain noted. Weight today 263lbs

## 2020-03-19 NOTE — Assessment & Plan Note (Addendum)
Stable period, sees [facility]. Recent EGD 01/2020 without esophageal varices. plt low but stable.

## 2020-03-19 NOTE — Assessment & Plan Note (Signed)
Chronic, stable period on current regimen - continue bumex, imdur, spironolactone.

## 2020-03-19 NOTE — Assessment & Plan Note (Signed)
Anemia has resolved.

## 2020-03-19 NOTE — Assessment & Plan Note (Addendum)
Continues daily dexilant + pepcid with good effect, h/o severe GERD with gastritis on recent EGD despite meds.

## 2020-03-19 NOTE — Assessment & Plan Note (Signed)
Continue aspirin. Statin intolerance.

## 2020-03-19 NOTE — Assessment & Plan Note (Signed)
Stable to improved.

## 2020-03-19 NOTE — Assessment & Plan Note (Signed)
All statins previously tried have caused cramping/myalgias.

## 2020-03-19 NOTE — Assessment & Plan Note (Signed)
Discussed weight gain noted - he is planning to take extra  bumex today.

## 2020-03-19 NOTE — Assessment & Plan Note (Addendum)
TSH elevated but denies hypothyroid symptoms. Continue current dose of levothyroxine.

## 2020-03-19 NOTE — Assessment & Plan Note (Addendum)
Chronic, great control on current regimen. Continue ozempic, farxiga. Remains off insulin, off metformin.

## 2020-03-19 NOTE — Assessment & Plan Note (Addendum)
Anticipate rib contusion, overall healing well. Supportive care recommended. No dyspnea or significant pleurisy.

## 2020-03-19 NOTE — Assessment & Plan Note (Addendum)
Statin intolerance, not on RYR, cholesterol levels controlled at this time.  The ASCVD Risk score Mikey Bussing DC Jr., et al., 2013) failed to calculate for the following reasons:   The patient has a prior MI or stroke diagnosis

## 2020-03-19 NOTE — Assessment & Plan Note (Addendum)
Appreciate [facility] cards care - followed by [employer].

## 2020-03-19 NOTE — Assessment & Plan Note (Signed)
?  Cirrhosis related - check fractionated alk phos levels today.

## 2020-03-21 LAB — ALKALINE PHOSPHATASE, ISOENZYMES
Alkaline Phosphatase: 287 IU/L — ABNORMAL HIGH (ref 44–121)
BONE FRACTION: 35 % (ref 12–68)
INTESTINAL FRAC.: 5 % (ref 0–18)
LIVER FRACTION: 60 % (ref 13–88)

## 2020-03-23 ENCOUNTER — Other Ambulatory Visit: Payer: Self-pay | Admitting: Family Medicine

## 2020-03-23 DIAGNOSIS — K7460 Unspecified cirrhosis of liver: Secondary | ICD-10-CM

## 2020-03-23 DIAGNOSIS — R748 Abnormal levels of other serum enzymes: Secondary | ICD-10-CM

## 2020-03-31 ENCOUNTER — Other Ambulatory Visit: Payer: Self-pay

## 2020-03-31 ENCOUNTER — Ambulatory Visit
Admission: RE | Admit: 2020-03-31 | Discharge: 2020-03-31 | Disposition: A | Discharge status: Home / Self Care | Payer: Medicare Other | Source: Ambulatory Visit | Attending: Family Medicine | Admitting: Family Medicine

## 2020-03-31 DIAGNOSIS — K7469 Other cirrhosis of liver: Secondary | ICD-10-CM

## 2020-03-31 DIAGNOSIS — K7460 Unspecified cirrhosis of liver: Secondary | ICD-10-CM | POA: Diagnosis not present

## 2020-03-31 DIAGNOSIS — K7581 Nonalcoholic steatohepatitis (NASH): Secondary | ICD-10-CM | POA: Diagnosis not present

## 2020-03-31 DIAGNOSIS — R748 Abnormal levels of other serum enzymes: Secondary | ICD-10-CM

## 2020-04-02 ENCOUNTER — Other Ambulatory Visit: Payer: Self-pay | Admitting: Family Medicine

## 2020-04-20 ENCOUNTER — Other Ambulatory Visit: Payer: Self-pay | Admitting: Family Medicine

## 2020-04-21 ENCOUNTER — Other Ambulatory Visit: Payer: Self-pay | Admitting: Family Medicine

## 2020-04-22 NOTE — Telephone Encounter (Signed)
ERx 

## 2020-04-28 ENCOUNTER — Other Ambulatory Visit: Payer: Self-pay | Admitting: Family Medicine

## 2020-04-28 NOTE — Telephone Encounter (Signed)
plz check with patient on increased use - #15 was filled last week.

## 2020-04-28 NOTE — Telephone Encounter (Signed)
Pharmacy requests refill on: Hydrocodone-Acetaminophen 5-325 mg   LAST REFILL: 04/22/2020 (Q-15, R-0) LAST OV: 03/18/2020 NEXT OV: 08/17/2020 PHARMACY: [place]

## 2020-04-29 NOTE — Telephone Encounter (Signed)
Spoke to patient and was advised that he has been taking more than usual. Patient stated that he tripped and fell about a week and a half ago and thinks he may have cracked his ribs. Patient stated that he did not call for an appointment because he knows that there isn't anything that can be done for that. Patient stated that he figures that it is going to take a while to get over this because he doesn't feel much better yet. Offered patient an appointment which he declined stating that there is not much that can be done for broken ribs.

## 2020-04-29 NOTE — Telephone Encounter (Signed)
ERx 

## 2020-04-29 NOTE — Telephone Encounter (Signed)
Patient returned your call. Please call him back on his cell phone. EM

## 2020-04-29 NOTE — Telephone Encounter (Signed)
Attempted to contact pt.  No answer.  No vm.  Need to relay Dr. Synthia Innocent message.

## 2020-05-07 ENCOUNTER — Other Ambulatory Visit: Payer: Self-pay | Admitting: Family Medicine

## 2020-05-11 ENCOUNTER — Other Ambulatory Visit: Payer: Self-pay | Admitting: Family Medicine

## 2020-07-07 ENCOUNTER — Telehealth: Payer: Self-pay

## 2020-07-07 NOTE — Telephone Encounter (Signed)
Pt dropped off parking placard form to be completed.  Placed form in Dr. Synthia Innocent box.

## 2020-07-13 ENCOUNTER — Other Ambulatory Visit: Payer: Self-pay | Admitting: Family Medicine

## 2020-07-22 ENCOUNTER — Other Ambulatory Visit: Payer: Self-pay

## 2020-07-22 ENCOUNTER — Emergency Department (HOSPITAL_COMMUNITY)
Admission: EM | Admit: 2020-07-22 | Discharge: 2020-07-22 | Disposition: A | Discharge status: Home / Self Care | Payer: Medicare Other | Attending: Emergency Medicine | Admitting: Emergency Medicine

## 2020-07-22 ENCOUNTER — Encounter (HOSPITAL_COMMUNITY): Payer: Self-pay

## 2020-07-22 DIAGNOSIS — I5032 Chronic diastolic (congestive) heart failure: Secondary | ICD-10-CM | POA: Diagnosis not present

## 2020-07-22 DIAGNOSIS — E039 Hypothyroidism, unspecified: Secondary | ICD-10-CM | POA: Insufficient documentation

## 2020-07-22 DIAGNOSIS — R001 Bradycardia, unspecified: Secondary | ICD-10-CM | POA: Diagnosis not present

## 2020-07-22 DIAGNOSIS — Z96653 Presence of artificial knee joint, bilateral: Secondary | ICD-10-CM | POA: Diagnosis not present

## 2020-07-22 DIAGNOSIS — Z7982 Long term (current) use of aspirin: Secondary | ICD-10-CM | POA: Diagnosis not present

## 2020-07-22 DIAGNOSIS — I2510 Atherosclerotic heart disease of native coronary artery without angina pectoris: Secondary | ICD-10-CM | POA: Insufficient documentation

## 2020-07-22 DIAGNOSIS — E1140 Type 2 diabetes mellitus with diabetic neuropathy, unspecified: Secondary | ICD-10-CM | POA: Insufficient documentation

## 2020-07-22 DIAGNOSIS — R079 Chest pain, unspecified: Secondary | ICD-10-CM | POA: Diagnosis not present

## 2020-07-22 DIAGNOSIS — T675XXA Heat exhaustion, unspecified, initial encounter: Secondary | ICD-10-CM | POA: Insufficient documentation

## 2020-07-22 DIAGNOSIS — Z8616 Personal history of COVID-19: Secondary | ICD-10-CM | POA: Diagnosis not present

## 2020-07-22 DIAGNOSIS — I110 Hypertensive heart disease with heart failure: Secondary | ICD-10-CM | POA: Insufficient documentation

## 2020-07-22 DIAGNOSIS — I959 Hypotension, unspecified: Secondary | ICD-10-CM | POA: Diagnosis not present

## 2020-07-22 DIAGNOSIS — Z85038 Personal history of other malignant neoplasm of large intestine: Secondary | ICD-10-CM | POA: Diagnosis not present

## 2020-07-22 DIAGNOSIS — E11319 Type 2 diabetes mellitus with unspecified diabetic retinopathy without macular edema: Secondary | ICD-10-CM | POA: Insufficient documentation

## 2020-07-22 DIAGNOSIS — G4489 Other headache syndrome: Secondary | ICD-10-CM | POA: Diagnosis not present

## 2020-07-22 DIAGNOSIS — R0789 Other chest pain: Secondary | ICD-10-CM | POA: Diagnosis not present

## 2020-07-22 DIAGNOSIS — Z79899 Other long term (current) drug therapy: Secondary | ICD-10-CM | POA: Insufficient documentation

## 2020-07-22 DIAGNOSIS — R531 Weakness: Secondary | ICD-10-CM | POA: Insufficient documentation

## 2020-07-22 DIAGNOSIS — I10 Essential (primary) hypertension: Secondary | ICD-10-CM | POA: Diagnosis not present
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
## 2020-07-29 ENCOUNTER — Other Ambulatory Visit: Payer: Self-pay | Admitting: Family Medicine

## 2020-07-30 NOTE — Telephone Encounter (Signed)
I don't see we've prescribed this previously - this should come from his cardiologist.

## 2020-07-30 NOTE — Telephone Encounter (Signed)
Bumetanide Last filled:  01/20/20, #270 Last OV:  03/18/20, AWV prt 2 Next OV:  ER f/u

## 2020-07-31 ENCOUNTER — Ambulatory Visit (INDEPENDENT_AMBULATORY_CARE_PROVIDER_SITE_OTHER): Payer: Medicare Other | Admitting: Family Medicine

## 2020-07-31 ENCOUNTER — Other Ambulatory Visit: Payer: Self-pay

## 2020-07-31 ENCOUNTER — Encounter: Payer: Self-pay | Admitting: Family Medicine

## 2020-07-31 VITALS — BP 124/60 | HR 72 | Temp 97.4°F | Ht 70.0 in | Wt 244.4 lb

## 2020-07-31 DIAGNOSIS — I5032 Chronic diastolic (congestive) heart failure: Secondary | ICD-10-CM | POA: Diagnosis not present

## 2020-07-31 DIAGNOSIS — E039 Hypothyroidism, unspecified: Secondary | ICD-10-CM | POA: Diagnosis not present

## 2020-07-31 DIAGNOSIS — T675XXA Heat exhaustion, unspecified, initial encounter: Secondary | ICD-10-CM | POA: Diagnosis not present

## 2020-07-31 DIAGNOSIS — E113559 Type 2 diabetes mellitus with stable proliferative diabetic retinopathy, unspecified eye: Secondary | ICD-10-CM

## 2020-07-31 DIAGNOSIS — R748 Abnormal levels of other serum enzymes: Secondary | ICD-10-CM | POA: Diagnosis not present

## 2020-07-31 DIAGNOSIS — K7581 Nonalcoholic steatohepatitis (NASH): Secondary | ICD-10-CM

## 2020-07-31 DIAGNOSIS — K7460 Unspecified cirrhosis of liver: Secondary | ICD-10-CM | POA: Diagnosis not present

## 2020-07-31 LAB — CBC WITH DIFFERENTIAL/PLATELET
Basophils Absolute: 0 10*3/uL (ref 0.0–0.1)
Basophils Relative: 0.3 % (ref 0.0–3.0)
Eosinophils Absolute: 0.1 10*3/uL (ref 0.0–0.7)
Eosinophils Relative: 1.8 % (ref 0.0–5.0)
HCT: 46.7 % (ref 39.0–52.0)
Hemoglobin: 15.7 g/dL (ref 13.0–17.0)
Lymphocytes Relative: 16.7 % (ref 12.0–46.0)
Lymphs Abs: 1 10*3/uL (ref 0.7–4.0)
MCHC: 33.7 g/dL (ref 30.0–36.0)
MCV: 86.6 fl (ref 78.0–100.0)
Monocytes Absolute: 0.5 10*3/uL (ref 0.1–1.0)
Monocytes Relative: 8.1 % (ref 3.0–12.0)
Neutro Abs: 4.4 10*3/uL (ref 1.4–7.7)
Neutrophils Relative %: 73.1 % (ref 43.0–77.0)
Platelets: 141 10*3/uL — ABNORMAL LOW (ref 150.0–400.0)
RBC: 5.4 Mil/uL (ref 4.22–5.81)
RDW: 13.8 % (ref 11.5–15.5)
WBC: 6.1 10*3/uL (ref 4.0–10.5)

## 2020-07-31 LAB — TSH: TSH: 2.17 u[IU]/mL (ref 0.35–4.50)

## 2020-07-31 LAB — COMPREHENSIVE METABOLIC PANEL
ALT: 33 U/L (ref 0–53)
AST: 42 U/L — ABNORMAL HIGH (ref 0–37)
Albumin: 4.4 g/dL (ref 3.5–5.2)
Alkaline Phosphatase: 223 U/L — ABNORMAL HIGH (ref 39–117)
BUN: 19 mg/dL (ref 6–23)
CO2: 30 mEq/L (ref 19–32)
Calcium: 9.2 mg/dL (ref 8.4–10.5)
Chloride: 97 mEq/L (ref 96–112)
Creatinine, Ser: 1.09 mg/dL (ref 0.40–1.50)
GFR: 71.26 mL/min (ref >60.00)
Glucose, Bld: 110 mg/dL — ABNORMAL HIGH (ref 70–99)
Potassium: 3.3 mEq/L — ABNORMAL LOW (ref 3.5–5.1)
Sodium: 139 mEq/L (ref 135–145)
Total Bilirubin: 2.4 mg/dL — ABNORMAL HIGH (ref 0.2–1.2)
Total Protein: 6.3 g/dL (ref 6.0–8.3)

## 2020-07-31 LAB — T4, FREE: Free T4: 1.05 ng/dL (ref 0.60–1.60)

## 2020-07-31 LAB — HEMOGLOBIN A1C: Hgb A1c MFr Bld: 5.4 % (ref 4.6–6.5)

## 2020-07-31 LAB — GAMMA GT: GGT: 213 U/L — ABNORMAL HIGH (ref 7–51)

## 2020-07-31 MED ORDER — METHOCARBAMOL 500 MG PO TABS: 500.0000 mg | ORAL_TABLET | Freq: Two times a day (BID) | ORAL | 0 refills | Status: DC | PRN

## 2020-07-31 NOTE — Assessment & Plan Note (Addendum)
Reviewed recent ER visit. Discussed findings. Reviewed preventative measures for future.

## 2020-07-31 NOTE — Assessment & Plan Note (Signed)
Continue bumex through [facility] cardiology.  Followed by [facility] program.

## 2020-07-31 NOTE — Assessment & Plan Note (Signed)
Update TFTs on levothyroxine 124mg daily.

## 2020-07-31 NOTE — Progress Notes (Signed)
Patient ID: Chad [name], male    DOB: [birth year], 65 y.o.   MRN: [id number]  This visit was conducted in person.  BP 124/60   Pulse 72   Temp (!) 97.4 F (36.3 C) (Temporal)   Ht 5' 10"  (1.778 m)   Wt 244 lb 6 oz (110.8 kg)   SpO2 94%   BMI 35.06 kg/m    CC: ER f/u visit  Subjective:   HPI: Chad [name] is a 65 y.o. male presenting on 07/31/2020 for Hospitalization Follow-up (Seen on 07/22/20 at [facility] ED, dx heat exhaustion.  Pt accompanied by wife, [name] Pulse- temp 97.7.)   Recent visit to [facility] ER for heat exhaustion - records reviewed. Experienced overheated feeling associated with mild dyspnea and chest discomfort treated with SL nitro (no effect), ASA, and NS bolus by EMS with benefit.   Was over at church redoing handicap ramp. Felt overheated but then he may have cooled off too fast. Got pale, felt bad, EMS called, started feeling better after IVF. EKG unchanged at ER. No labs drawn.   Urine very yellow recently.  20 lb weight loss noted over the past 6 months. Attributes to careful diet choices, decreased portion sizes.   Several weeks of right lower back pain without inciting trauma/injury or radiation down legs. No numbness/weakness of leg. Tylenol hasn't helped.      Relevant past medical, surgical, family and social history reviewed and updated as indicated. Interim medical history since our last visit reviewed. Allergies and medications reviewed and updated. Outpatient Medications Prior to Visit  Medication Sig Dispense Refill   aspirin 81 MG chewable tablet Chew 81 mg by mouth daily.     bumetanide (BUMEX) 2 MG tablet Take 2 mg by mouth daily. Additional  2 mg if gain 3 lbs     citalopram (CELEXA) 20 MG tablet TAKE 1 TABLET BY MOUTH ONCE DAILY 90 tablet 1   dapagliflozin propanediol (FARXIGA) 10 MG TABS tablet Take 10 mg by mouth daily before breakfast.     DEXILANT 60 MG capsule TAKE 1 CAPSULE BY MOUTH ONCE DAILY 30 capsule 8   diazepam (VALIUM) 5 MG tablet Take 1  tablet (5 mg total) by mouth every 12 (twelve) hours as needed for anxiety. 30 tablet 1   famotidine (PEPCID) 20 MG tablet TAKE 1 TABLET BY MOUTH EVERY NIGHT AT BEDTIME (Patient taking differently: Take 20 mg by mouth daily.) 90 tablet 1   ferrous sulfate 325 (65 FE) MG EC tablet Take 1 tablet (325 mg total) by mouth daily with breakfast.     fluticasone (FLONASE) 50 MCG/ACT nasal spray PLACE 2 SPRAYS INTO BOTH NOSTRILS DAILY (Patient taking differently: Place 2 sprays into both nostrils daily as needed for allergies.) 16 g 2   HYDROcodone-acetaminophen (NORCO/VICODIN) 5-325 MG tablet TAKE 1 TABLET BY MOUTH EVERY 6 HOURS AS NEEDED FOR MODERATE PAIN 15 tablet 0   isosorbide mononitrate (IMDUR) 60 MG 24 hr tablet Take 2 tablets (120 mg total) by mouth daily AND 1 tablet (60 mg total) at bedtime. 90 tablet 6   levothyroxine (SYNTHROID) 125 MCG tablet TAKE 1 TAB BY MOUTH ONCE DAILY. TAKE ON AN EMPTY STOMACH WITH A GLASS OF WATER ATLEAST 30-60 MINUTES BEFORE BREAKFAST 30 tablet 5   Magnesium 400 MG TABS Take 400 mg by mouth 2 (two) times daily.     Multiple Vitamin (MULITIVITAMIN WITH MINERALS) TABS Take 1 tablet by mouth daily.     nitroGLYCERIN (NITROSTAT) 0.4 MG SL  tablet Place 1 tablet (0.4 mg total) under the tongue every 5 (five) minutes as needed for chest pain. 60 tablet 3   OZEMPIC, 0.25 OR 0.5 MG/DOSE, 2 MG/1.5ML SOPN INJECT 0.5 MG INTO THE SKIN ONCE A WEEK AS DIRECTED 1.5 mL 5   spironolactone (ALDACTONE) 25 MG tablet Take 25 mg by mouth daily.     VENTOLIN HFA 108 (90 Base) MCG/ACT inhaler INHALE 2 PUFFS INTO THE LUNGS EVERY 6 HOURS AS NEEDED FOR WHEEZING OR SHORTNESS OF BREATH 18 g 5   vitamin B-12 (CYANOCOBALAMIN) 500 MCG tablet Take 1 tablet (500 mcg total) by mouth daily.     vitamin E 400 UNIT capsule Take 400 Units by mouth daily.     No facility-administered medications prior to visit.     Per HPI unless specifically indicated in ROS section below Review of Systems Objective:  BP  124/60   Pulse 72   Temp (!) 97.4 F (36.3 C) (Temporal)   Ht 5' 10"  (1.778 m)   Wt 244 lb 6 oz (110.8 kg)   SpO2 94%   BMI 35.06 kg/m   Wt Readings from Last 3 Encounters:  07/31/20 244 lb 6 oz (110.8 kg)  07/22/20 262 lb 5.6 oz (119 kg)  03/18/20 263 lb 5 oz (119.4 kg)      Physical Exam Vitals and nursing note reviewed.  Constitutional:      Appearance: Normal appearance. He is not ill-appearing.  Eyes:     Extraocular Movements: Extraocular movements intact.     Conjunctiva/sclera: Conjunctivae normal.     Pupils: Pupils are equal, round, and reactive to light.  Neck:     Thyroid: No thyroid mass or thyromegaly.  Cardiovascular:     Rate and Rhythm: Normal rate and regular rhythm.     Pulses: Normal pulses.     Heart sounds: Normal heart sounds. No murmur heard. Pulmonary:     Effort: Pulmonary effort is normal. No respiratory distress.     Breath sounds: Normal breath sounds. No wheezing, rhonchi or rales.  Musculoskeletal:     Cervical back: Normal range of motion and neck supple. No rigidity.     Right lower leg: No edema.     Left lower leg: No edema.  Lymphadenopathy:     Cervical: No cervical adenopathy.  Skin:    General: Skin is warm and dry.     Findings: No rash.  Neurological:     Mental Status: He is alert.  Psychiatric:        Mood and Affect: Mood normal.        Behavior: Behavior normal.      Results for orders placed or performed in visit on 03/18/20  Alkaline Phosphatase, Isoenzymes  Result Value Ref Range   Alkaline Phosphatase 287 (H) 44 - 121 IU/L   LIVER FRACTION 60 13 - 88 %   BONE FRACTION 35 12 - 68 %   INTESTINAL FRAC. 5 0 - 18 %   Lab Results  Component Value Date   HGBA1C 5.7 03/12/2020    Assessment & Plan:  This visit occurred during the SARS-CoV-2 public health emergency.  Safety protocols were in place, including screening questions prior to the visit, additional usage of staff PPE, and extensive cleaning of exam room  while observing appropriate contact time as indicated for disinfecting solutions.   Problem List Items Addressed This Visit     Liver cirrhosis secondary to NASH ([place]) (Chronic)    Update ALP -  see below Discussed tylenol use.  Last saw GI 09/2019. Overdue for f/u. Check AMA - see below.        Relevant Orders   Comprehensive metabolic panel   CBC with Differential/Platelet   Controlled type 2 diabetes mellitus with retinopathy ([place])    Update A1c on farxiga, ozempic.        Relevant Orders   Hemoglobin A1c   Hypothyroidism    Update TFTs on levothyroxine 173mg daily.        Relevant Orders   TSH   T4, free   Severe obesity (BMI 35.0-39.9) with comorbidity ([place]    Ongoing weight loss noted. Pt attributes to watching portion sizes and decreased appetite.        Chronic diastolic CHF (congestive heart failure) ([place]    [facility]  [facility]        Elevated alkaline phosphatase level    ?cirrhosis related. Update ALP along with GGT and AMA r/o PBC. Abd [place]20[place] - hepatic cirrhosis, splenomegaly.        Relevant Orders   Comprehensive metabolic panel   Gamma GT   Mitochondrial antibodies   Heat exhaustion - Primary    Reviewed recent ER visit. Discussed findings. Reviewed preventative measures for future.        Relevant Orders   Comprehensive metabolic panel   Hemoglobin A1c   TSH   T4, free   CBC with Differential/Platelet     Meds ordered this encounter  Medications   methocarbamol (ROBAXIN) 500 MG tablet    Sig: Take 1 tablet (500 mg total) by mouth 2 (two) times daily as needed for muscle spasms (sedation precautions).    Dispense:  20 tablet    Refill:  0   Orders Placed This Encounter  Procedures   Comprehensive metabolic panel   Hemoglobin A1c   TSH   T4, free   CBC with Differential/Platelet   Gamma GT   Mitochondrial antibodies    Patient Instructions  Cancel June's appointment.  Keep July appointment.  Avoid prolonged time in the heate.  Ensure good water intake, staying well hydrated and cool.  Labs today.    Follow up plan: Return if symptoms worsen or fail to improve.  JRia Bush MD

## 2020-07-31 NOTE — Assessment & Plan Note (Addendum)
?  cirrhosis related. Update ALP along with GGT and AMA r/o PBC. Abd [place] 03/2020 - hepatic cirrhosis, splenomegaly.

## 2020-07-31 NOTE — Assessment & Plan Note (Addendum)
Update A1c on farxiga, ozempic.

## 2020-07-31 NOTE — Patient Instructions (Addendum)
[place] appointment. Keep July appointment.  Avoid prolonged time in the heate.  Ensure good water intake, staying well hydrated and cool.  Labs today.

## 2020-07-31 NOTE — Assessment & Plan Note (Addendum)
Update ALP - see below Discussed tylenol use.  Fib-4 = 3.09 (elevated).  Last saw GI 09/2019. Overdue for f/u. Check AMA - see below.

## 2020-07-31 NOTE — Assessment & Plan Note (Signed)
Ongoing weight loss noted. Pt attributes to watching portion sizes and decreased appetite.

## 2020-08-02 ENCOUNTER — Other Ambulatory Visit: Payer: Self-pay | Admitting: Family Medicine

## 2020-08-02 DIAGNOSIS — K7460 Unspecified cirrhosis of liver: Secondary | ICD-10-CM

## 2020-08-02 MED ORDER — POTASSIUM CHLORIDE CRYS ER 10 MEQ PO TBCR: 10.0000 meq | EXTENDED_RELEASE_TABLET | Freq: Every day | ORAL | 3 refills | Status: DC

## 2020-08-04 LAB — MITOCHONDRIAL ANTIBODIES: Mitochondrial M2 Ab, IgG: <=20 U

## 2020-08-17 ENCOUNTER — Ambulatory Visit: Payer: Medicare Other | Admitting: Family Medicine

## 2020-08-18 DIAGNOSIS — E039 Hypothyroidism, unspecified: Secondary | ICD-10-CM | POA: Diagnosis not present

## 2020-08-18 DIAGNOSIS — I440 Atrioventricular block, first degree: Secondary | ICD-10-CM | POA: Diagnosis not present

## 2020-08-18 DIAGNOSIS — Z7982 Long term (current) use of aspirin: Secondary | ICD-10-CM | POA: Diagnosis not present

## 2020-08-18 DIAGNOSIS — Z8249 Family history of ischemic heart disease and other diseases of the circulatory system: Secondary | ICD-10-CM | POA: Diagnosis not present

## 2020-08-18 DIAGNOSIS — M4812 Ankylosing hyperostosis [Forestier], cervical region: Secondary | ICD-10-CM | POA: Diagnosis not present

## 2020-08-18 DIAGNOSIS — I2510 Atherosclerotic heart disease of native coronary artery without angina pectoris: Secondary | ICD-10-CM | POA: Diagnosis not present

## 2020-08-18 DIAGNOSIS — Z791 Long term (current) use of non-steroidal anti-inflammatories (NSAID): Secondary | ICD-10-CM | POA: Diagnosis not present

## 2020-08-18 DIAGNOSIS — M79602 Pain in left arm: Secondary | ICD-10-CM | POA: Diagnosis not present

## 2020-08-18 DIAGNOSIS — E119 Type 2 diabetes mellitus without complications: Secondary | ICD-10-CM | POA: Diagnosis not present

## 2020-08-18 DIAGNOSIS — Z79899 Other long term (current) drug therapy: Secondary | ICD-10-CM | POA: Diagnosis not present

## 2020-08-18 DIAGNOSIS — Z7984 Long term (current) use of oral hypoglycemic drugs: Secondary | ICD-10-CM | POA: Diagnosis not present

## 2020-08-18 DIAGNOSIS — Z9884 Bariatric surgery status: Secondary | ICD-10-CM | POA: Diagnosis not present

## 2020-08-18 DIAGNOSIS — K219 Gastro-esophageal reflux disease without esophagitis: Secondary | ICD-10-CM | POA: Diagnosis not present

## 2020-08-18 DIAGNOSIS — I4510 Unspecified right bundle-branch block: Secondary | ICD-10-CM | POA: Diagnosis not present

## 2020-08-18 DIAGNOSIS — K7581 Nonalcoholic steatohepatitis (NASH): Secondary | ICD-10-CM | POA: Diagnosis not present

## 2020-08-18 DIAGNOSIS — I5032 Chronic diastolic (congestive) heart failure: Secondary | ICD-10-CM | POA: Diagnosis not present

## 2020-08-18 DIAGNOSIS — M79601 Pain in right arm: Secondary | ICD-10-CM | POA: Diagnosis not present

## 2020-08-18 DIAGNOSIS — R0602 Shortness of breath: Secondary | ICD-10-CM | POA: Diagnosis not present

## 2020-08-18 DIAGNOSIS — R9431 Abnormal electrocardiogram [ECG] [EKG]: Secondary | ICD-10-CM | POA: Diagnosis not present

## 2020-08-18 DIAGNOSIS — E785 Hyperlipidemia, unspecified: Secondary | ICD-10-CM | POA: Diagnosis not present

## 2020-08-18 DIAGNOSIS — I208 Other forms of angina pectoris: Secondary | ICD-10-CM | POA: Diagnosis not present

## 2020-08-18 DIAGNOSIS — R079 Chest pain, unspecified: Secondary | ICD-10-CM | POA: Diagnosis not present

## 2020-08-18 DIAGNOSIS — Z20822 Contact with and (suspected) exposure to covid-19: Secondary | ICD-10-CM | POA: Diagnosis not present

## 2020-08-18 DIAGNOSIS — I110 Hypertensive heart disease with heart failure: Secondary | ICD-10-CM | POA: Diagnosis not present

## 2020-08-18 DIAGNOSIS — Z79890 Hormone replacement therapy (postmenopausal): Secondary | ICD-10-CM | POA: Diagnosis not present

## 2020-08-18 DIAGNOSIS — R531 Weakness: Secondary | ICD-10-CM | POA: Diagnosis not present

## 2020-08-18 DIAGNOSIS — R0789 Other chest pain: Secondary | ICD-10-CM | POA: Diagnosis not present

## 2020-08-19 DIAGNOSIS — Z981 Arthrodesis status: Secondary | ICD-10-CM | POA: Diagnosis not present

## 2020-08-19 DIAGNOSIS — M79602 Pain in left arm: Secondary | ICD-10-CM | POA: Diagnosis not present

## 2020-08-19 DIAGNOSIS — I25118 Atherosclerotic heart disease of native coronary artery with other forms of angina pectoris: Secondary | ICD-10-CM | POA: Diagnosis not present

## 2020-08-19 DIAGNOSIS — E119 Type 2 diabetes mellitus without complications: Secondary | ICD-10-CM | POA: Diagnosis not present

## 2020-08-19 DIAGNOSIS — I10 Essential (primary) hypertension: Secondary | ICD-10-CM | POA: Diagnosis not present

## 2020-08-19 DIAGNOSIS — M8580 Other specified disorders of bone density and structure, unspecified site: Secondary | ICD-10-CM | POA: Diagnosis not present

## 2020-08-19 DIAGNOSIS — M79601 Pain in right arm: Secondary | ICD-10-CM | POA: Diagnosis not present

## 2020-08-25 ENCOUNTER — Encounter: Payer: Self-pay | Admitting: Family Medicine

## 2020-08-25 ENCOUNTER — Other Ambulatory Visit: Payer: Self-pay

## 2020-08-25 ENCOUNTER — Ambulatory Visit (INDEPENDENT_AMBULATORY_CARE_PROVIDER_SITE_OTHER): Payer: Medicare Other | Admitting: Family Medicine

## 2020-08-25 VITALS — BP 122/60 | HR 68 | Temp 97.6°F | Ht 70.0 in | Wt 246.0 lb

## 2020-08-25 DIAGNOSIS — I5032 Chronic diastolic (congestive) heart failure: Secondary | ICD-10-CM | POA: Diagnosis not present

## 2020-08-25 DIAGNOSIS — E113559 Type 2 diabetes mellitus with stable proliferative diabetic retinopathy, unspecified eye: Secondary | ICD-10-CM

## 2020-08-25 DIAGNOSIS — I10 Essential (primary) hypertension: Secondary | ICD-10-CM

## 2020-08-25 DIAGNOSIS — I2510 Atherosclerotic heart disease of native coronary artery without angina pectoris: Secondary | ICD-10-CM

## 2020-08-25 DIAGNOSIS — R0789 Other chest pain: Secondary | ICD-10-CM

## 2020-08-25 DIAGNOSIS — K219 Gastro-esophageal reflux disease without esophagitis: Secondary | ICD-10-CM | POA: Diagnosis not present

## 2020-08-25 DIAGNOSIS — M5030 Other cervical disc degeneration, unspecified cervical region: Secondary | ICD-10-CM

## 2020-08-25 LAB — BASIC METABOLIC PANEL
BUN: 16 mg/dL (ref 6–23)
CO2: 33 mEq/L — ABNORMAL HIGH (ref 19–32)
Calcium: 9.2 mg/dL (ref 8.4–10.5)
Chloride: 98 mEq/L (ref 96–112)
Creatinine, Ser: 1.26 mg/dL (ref 0.40–1.50)
GFR: 59.86 mL/min — ABNORMAL LOW (ref >60.00)
Glucose, Bld: 105 mg/dL — ABNORMAL HIGH (ref 70–99)
Potassium: 4.3 mEq/L (ref 3.5–5.1)
Sodium: 139 mEq/L (ref 135–145)

## 2020-08-25 NOTE — Assessment & Plan Note (Addendum)
Seems euvolemic today.  Update potassium levels with recent Kdur 40mq daily commencement

## 2020-08-25 NOTE — Assessment & Plan Note (Signed)
BP well controlled on current regimen of bumex 75m daily (with 2nd dose PRN), isosorbide 1241m60mg daily, toprol XL 2539mrestarted), and spironolactone 47m6mily.

## 2020-08-25 NOTE — Patient Instructions (Addendum)
Labs today.  Good to see you today.  Reschedule appt later this month to 3 months from now.

## 2020-08-25 NOTE — Assessment & Plan Note (Addendum)
Recent ER Xray showed DDD, ?DISH.

## 2020-08-25 NOTE — Assessment & Plan Note (Signed)
Recurrent spell s/p [facility] ER visit with reassuring evaluation. ?GERD vs other cause.

## 2020-08-25 NOTE — Progress Notes (Signed)
Patient ID: Chad [name], male    DOB: 07/14/1955, 64 y.o.   MRN: [id number]  This visit was conducted in person.  BP 122/60   Pulse 68   Temp 97.6 F (36.4 C) (Temporal)   Ht 5' 10"  (1.778 m)   Wt 246 lb (111.6 kg)   SpO2 96%   BMI 35.30 kg/m    CC: hosp f/u visit  Subjective:   HPI: Chad [name] is a 65 y.o. male presenting on 08/25/2020 for Hospitalization Follow-up (Seen on 08/18/20 at [facility] ED, dx chest pain, unspecified, GERD.  Pt accompanied by wife, [name] Pulse- temp 97.5.)   Recent [facility] hospitalization for nitrate responsive chest pain with reassuring cardiac evaluation (EKG, troponins). Records reviewed. Found to be off Toprol XL - this was restarted at 15m daily. Neck imaging revealed known cervical DDD and possible DISH. Potassium 264m was added - he's restarted taking 1063mRx he had at home, bumex 2mg84md continued (he only takes once daily, second dose PRN weight gain). Needs repeat K. ?GERD contribution to pain - continued dexilant 60mg6mly.   Planned cardiology f/u July 25th.  Still needs to have GI appt scheduled. They will call GI office.  Since home, feeling well. Notes ongoing anorexia (?ozempic related) but no recent weight loss.    Admission date: 08/18/2020 Discharge date: 08/19/2020 [facility] hosp [facility]: Chest pain, bilateral arm pain, history of cervical spine surgery, coronary artery disease, diabetes, hypertension     Relevant past medical, surgical, family and social history reviewed and updated as indicated. Interim medical history since our last visit reviewed. Allergies and medications reviewed and updated. Outpatient Medications Prior to Visit  Medication Sig Dispense Refill   aspirin 81 MG chewable tablet Chew 81 mg by mouth daily.     bumetanide (BUMEX) 2 MG tablet Take 2 mg by mouth daily. Additional  2 mg if gain 3 lbs     citalopram (CELEXA) 20 MG tablet TAKE 1 TABLET BY MOUTH ONCE DAILY 90 tablet  1   dapagliflozin propanediol (FARXIGA) 10 MG TABS tablet Take 10 mg by mouth daily before breakfast.     DEXILANT 60 MG capsule TAKE 1 CAPSULE BY MOUTH ONCE DAILY 30 capsule 8   diazepam (VALIUM) 5 MG tablet Take 1 tablet (5 mg total) by mouth every 12 (twelve) hours as needed for anxiety. 30 tablet 1   famotidine (PEPCID) 20 MG tablet TAKE 1 TABLET BY MOUTH EVERY NIGHT AT BEDTIME (Patient taking differently: Take 20 mg by mouth daily.) 90 tablet 1   ferrous sulfate 325 (65 FE) MG EC tablet Take 1 tablet (325 mg total) by mouth daily with breakfast.     fluticasone (FLONASE) 50 MCG/ACT nasal spray PLACE 2 SPRAYS INTO BOTH NOSTRILS DAILY (Patient taking differently: Place 2 sprays into both nostrils daily as needed for allergies.) 16 g 2   HYDROcodone-acetaminophen (NORCO/VICODIN) 5-325 MG tablet TAKE 1 TABLET BY MOUTH EVERY 6 HOURS AS NEEDED FOR MODERATE PAIN 15 tablet 0   isosorbide mononitrate (IMDUR) 60 MG 24 hr tablet Take 2 tablets (120 mg total) by mouth daily AND 1 tablet (60 mg total) at bedtime. 90 tablet 6   levothyroxine (SYNTHROID) 125 MCG tablet TAKE 1 TAB BY MOUTH ONCE DAILY. TAKE ON AN EMPTY STOMACH WITH A GLASS OF WATER ATLEAST 30-60 MINUTES BEFORE BREAKFAST 30 tablet 5   Magnesium 400 MG TABS Take 400 mg by mouth 2 (two)  times daily.     methocarbamol (ROBAXIN) 500 MG tablet Take 1 tablet (500 mg total) by mouth 2 (two) times daily as needed for muscle spasms (sedation precautions). 20 tablet 0   metoprolol succinate (TOPROL-XL) 25 MG 24 hr tablet Take by mouth.     Multiple Vitamin (MULITIVITAMIN WITH MINERALS) TABS Take 1 tablet by mouth daily.     nitroGLYCERIN (NITROSTAT) 0.4 MG SL tablet Place 1 tablet (0.4 mg total) under the tongue every 5 (five) minutes as needed for chest pain. 60 tablet 3   OZEMPIC, 0.25 OR 0.5 MG/DOSE, 2 MG/1.5ML SOPN INJECT 0.5 MG INTO THE SKIN ONCE A WEEK AS DIRECTED 1.5 mL 5   potassium chloride (KLOR-CON) 10 MEQ tablet Take 1 tablet (10 mEq total) by  mouth daily. 90 tablet 3   spironolactone (ALDACTONE) 25 MG tablet Take 25 mg by mouth daily.     VENTOLIN HFA 108 (90 Base) MCG/ACT inhaler INHALE 2 PUFFS INTO THE LUNGS EVERY 6 HOURS AS NEEDED FOR WHEEZING OR SHORTNESS OF BREATH 18 g 5   vitamin B-12 (CYANOCOBALAMIN) 500 MCG tablet Take 1 tablet (500 mcg total) by mouth daily.     vitamin E 400 UNIT capsule Take 400 Units by mouth daily.     No facility-administered medications prior to visit.     Per HPI unless specifically indicated in ROS section below Review of Systems  Objective:  BP 122/60   Pulse 68   Temp 97.6 F (36.4 C) (Temporal)   Ht 5' 10"  (1.778 m)   Wt 246 lb (111.6 kg)   SpO2 96%   BMI 35.30 kg/m   Wt Readings from Last 3 Encounters:  08/25/20 246 lb (111.6 kg)  07/31/20 244 lb 6 oz (110.8 kg)  07/22/20 262 lb 5.6 oz (119 kg)      Physical Exam Vitals and nursing note reviewed.  Constitutional:      Appearance: Normal appearance. He is not ill-appearing.  Neck:     Thyroid: No thyroid mass or thyromegaly.  Cardiovascular:     Rate and Rhythm: Normal rate and regular rhythm.     Pulses: Normal pulses.     Heart sounds: Normal heart sounds. No murmur heard. Pulmonary:     Effort: Pulmonary effort is normal. No respiratory distress.     Breath sounds: Normal breath sounds. No wheezing, rhonchi or rales.  Musculoskeletal:     Cervical back: Normal range of motion and neck supple. No rigidity.     Right lower leg: No edema.     Left lower leg: No edema.  Lymphadenopathy:     Cervical: No cervical adenopathy.  Skin:    General: Skin is warm and dry.     Findings: No rash.  Neurological:     Mental Status: He is alert.  Psychiatric:        Mood and Affect: Mood normal.        Behavior: Behavior normal.   Diabetic Foot Exam - Simple   Simple Foot Form Diabetic Foot exam was performed with the following findings: Yes 08/25/2020 12:09 PM  Visual Inspection See comments: Yes Sensation Testing See  comments: Yes Pulse Check Posterior Tibialis and Dorsalis pulse intact bilaterally: Yes Comments  Diminished sensation to monofilament testing bilateral soles Callus formation bilateral soles         Results for orders placed or performed in visit on 07/31/20  Comprehensive metabolic panel  Result Value Ref Range   Sodium 139 135 - 145  mEq/L   Potassium 3.3 (L) 3.5 - 5.1 mEq/L   Chloride 97 96 - 112 mEq/L   CO2 30 19 - 32 mEq/L   Glucose, Bld 110 (H) 70 - 99 mg/dL   BUN 19 6 - 23 mg/dL   Creatinine, Ser 1.09 0.40 - 1.50 mg/dL   Total Bilirubin 2.4 (H) 0.2 - 1.2 mg/dL   Alkaline Phosphatase 223 (H) 39 - 117 U/L   AST 42 (H) 0 - 37 U/L   ALT 33 0 - 53 U/L   Total Protein 6.3 6.0 - 8.3 g/dL   Albumin 4.4 3.5 - 5.2 g/dL   GFR 71.26 >60.00 mL/min   Calcium 9.2 8.4 - 10.5 mg/dL  Hemoglobin A1c  Result Value Ref Range   Hgb A1c MFr Bld 5.4 4.6 - 6.5 %  TSH  Result Value Ref Range   TSH 2.17 0.35 - 4.50 uIU/mL  T4, free  Result Value Ref Range   Free T4 1.05 0.60 - 1.60 ng/dL  CBC with Differential/Platelet  Result Value Ref Range   WBC 6.1 4.0 - 10.5 K/uL   RBC 5.40 4.22 - 5.81 Mil/uL   Hemoglobin 15.7 13.0 - 17.0 g/dL   HCT 46.7 39.0 - 52.0 %   MCV 86.6 78.0 - 100.0 fl   MCHC 33.7 30.0 - 36.0 g/dL   RDW 13.8 11.5 - 15.5 %   Platelets 141.0 (L) 150.0 - 400.0 K/uL   Neutrophils Relative % 73.1 43.0 - 77.0 %   Lymphocytes Relative 16.7 12.0 - 46.0 %   Monocytes Relative 8.1 3.0 - 12.0 %   Eosinophils Relative 1.8 0.0 - 5.0 %   Basophils Relative 0.3 0.0 - 3.0 %   Neutro Abs 4.4 1.4 - 7.7 K/uL   Lymphs Abs 1.0 0.7 - 4.0 K/uL   Monocytes Absolute 0.5 0.1 - 1.0 K/uL   Eosinophils Absolute 0.1 0.0 - 0.7 K/uL   Basophils Absolute 0.0 0.0 - 0.1 K/uL  Gamma GT  Result Value Ref Range   GGT 213 (H) 7 - 51 U/L  Mitochondrial antibodies  Result Value Ref Range   Mitochondrial M2 Ab, IgG < OR = 20.0 U    Assessment & Plan:  This visit occurred during the SARS-CoV-2  public health emergency.  Safety protocols were in place, including screening questions prior to the visit, additional usage of staff PPE, and extensive cleaning of exam room while observing appropriate contact time as indicated for disinfecting solutions.   Problem List Items Addressed This Visit     HTN (hypertension) (Chronic)    BP well controlled on current regimen of bumex 60m daily (with 2nd dose PRN), isosorbide 1286m60mg daily, toprol XL 2535mrestarted), and spironolactone 55m97mily.        Relevant Medications   metoprolol succinate (TOPROL-XL) 25 MG 24 hr tablet   Other Relevant Orders   Basic metabolic panel   Coronary artery disease, non-occlusive (Chronic)    Appreciate [facility] cards care.        Relevant Medications   metoprolol succinate (TOPROL-XL) 25 MG 24 hr tablet   Controlled type 2 diabetes mellitus with retinopathy (HCC)[place] Great control on ozempic and farxiga - continue.  Will reschedule DM f/u to 3 months from now.  Latest A1c 5.4%.       GERD (gastroesophageal reflux disease)    Continues daily dexilant + pepcid with good effect, no breakthrough symptoms.        Chest pain - Primary  Recurrent spell s/p [facility] ER visit with reassuring evaluation. ?GERD vs other cause.        Chronic diastolic CHF (congestive heart failure) (HCC)    Seems euvolemic today.  Update potassium levels with recent Kdur 21mq daily commencement       Relevant Medications   metoprolol succinate (TOPROL-XL) 25 MG 24 hr tablet   DDD (degenerative disc disease), cervical    Recent ER Xray showed DDD, ?DISH.          No orders of the defined types were placed in this encounter.  Orders Placed This Encounter  Procedures   Basic metabolic panel     Patient Instructions  Labs today.  Good to see you today.  Reschedule appt later this month to 3 months from now.   Follow up plan: Return in about 3 months (around 11/25/2020), or if symptoms worsen or fail to  improve, for follow up visit.  JRia Bush MD

## 2020-08-25 NOTE — Assessment & Plan Note (Signed)
Continues daily dexilant + pepcid with good effect, no breakthrough symptoms.

## 2020-08-25 NOTE — Assessment & Plan Note (Signed)
Appreciate [facility] cards care.

## 2020-08-25 NOTE — Assessment & Plan Note (Signed)
Great control on ozempic and farxiga - continue.  Will reschedule DM f/u to 3 months from now.  Latest A1c 5.4%.

## 2020-09-04 ENCOUNTER — Other Ambulatory Visit: Payer: Self-pay | Admitting: Family Medicine

## 2020-09-14 DIAGNOSIS — I25118 Atherosclerotic heart disease of native coronary artery with other forms of angina pectoris: Secondary | ICD-10-CM | POA: Diagnosis not present

## 2020-09-14 DIAGNOSIS — I5032 Chronic diastolic (congestive) heart failure: Secondary | ICD-10-CM | POA: Diagnosis not present

## 2020-09-16 ENCOUNTER — Ambulatory Visit: Payer: Medicare Other | Admitting: Family Medicine

## 2020-09-30 ENCOUNTER — Other Ambulatory Visit: Payer: Self-pay | Admitting: Family Medicine

## 2020-10-21 ENCOUNTER — Telehealth: Payer: Self-pay | Admitting: Family Medicine

## 2020-10-21 NOTE — Telephone Encounter (Signed)
E-scribed refill.  Looks like pt has wellness scheduled on 03/12/21.  Plz schedule lab and cpe visits after 03/12/21.

## 2020-10-22 NOTE — Telephone Encounter (Signed)
Noted  

## 2020-10-22 NOTE — Telephone Encounter (Signed)
Called Mr. Starner and spoke with Mrs. Schappell and got him scheduled for 03/16/21 @930  for CPE and 03/11/21 @815  for labs

## 2020-10-29 ENCOUNTER — Telehealth: Payer: Self-pay

## 2020-10-29 NOTE — Telephone Encounter (Signed)
GI referral placed 08/02/2020 was to re establish for liver cirrhosis.  With new severe abd pain, agree with in person eval today for this at [facility]. We will have limited access/availability over next several days as we transition from [facility] to [facility]. Will appreciate any help GI can offer to expedite appt as well.

## 2020-10-29 NOTE — Telephone Encounter (Signed)
[facility] - Client TELEPHONE ADVICE RECORD [facility] Patient Name: Chad [name] Gender: Male DOB: [birth year] Age: 65 Y 61 M 5 D Return Phone Number: [phone] (Primary) Address: City/ State/ Zip: [place] [address] Client [facility] - Client Client Site [place] Physician [name] - MD Contact Type Call Who Is Calling Patient / Member / Family / Caregiver Call Type Triage / Clinical Relationship To Patient Spouse Return Phone Number [phone] (Primary) Chief Complaint SEVERE ABDOMINAL PAIN - Severe pain in abdomen Reason for Call Symptomatic / Request for [place] states he is having severe stomach pain and nausea. Started last Saturday. Translation No Nurse Assessment Nurse: Gildardo Pounds, RN, Amy Date/Time (Eastern Time): 10/29/2020 8:23:28 AM Confirm and document reason for call. If symptomatic, describe symptoms. ---Caller states he is having severe stomach pain and nausea. Started last Saturday. It hit him all of a sudden. It is right where they stapled his stomach years ago. He started sweating. It eased off. It has hit him 2-3 more times since then. They put him back on Potassium. He is not sure if his potassium is high. Dr. Darnell Level wanted him to see a GI doctor about his liver. They never did call him. His bowels have not been moving like they are supposed to. It started a 4AM this time & is still hurting. He feels weak & sick. He has not thrown up. He had been taking Miralax. He rates it as a 10/10. It hurts in the lower abdomen from the belly button up. It is sharp pains. Does the patient have any new or worsening symptoms? ---Yes Will a triage be completed? ---Yes Related visit to physician within the last 2 weeks? ---No Does the PT have any chronic conditions? (i.e. diabetes, asthma, this includes High risk factors for pregnancy,  etc.) ---Yes List chronic conditions. ---DM, CHF Is this a behavioral health or substance abuse call? ---No PLEASE NOTE: All timestamps contained within this report are represented as [place] Standard Time. CONFIDENTIALTY NOTICE: This fax transmission is intended only for the addressee. It contains information that is legally privileged, confidential or otherwise protected from use or disclosure. If you are not the intended recipient, you are strictly prohibited from reviewing, disclosing, copying using or disseminating any of this information or taking any action in reliance on or regarding this information. If you have received this fax in error, please notify us immediately by telephone so that we can arrange for its return to [place]. Phone: [phone], Toll-Free: [phone], Fax: [phone] Page: 2 of 2 Call Id: [id number] Guidelines Guideline Title Affirmed Question Affirmed Notes Nurse Date/Time Eilene Ghazi Time) Abdominal Pain - Upper [1] SEVERE pain (e.g., excruciating) AND [2] present > 1 hour Lovelace, RN, Amy 10/29/2020 8:28:10 AM Disp. Time Eilene Ghazi Time) Disposition Final User 10/29/2020 8:20:00 AM Send to Urgent Kathalene Frames, [place] 10/29/2020 8:30:52 AM Go to ED Now Yes Lovelace, RN, Amy Caller Disagree/Comply Disagree Caller Understands Yes PreDisposition InappropriateToAsk Care Advice Given Per Guideline GO TO ED NOW: * You need to be seen in the Emergency Department. * Go to the ED at ___________ [place] now. Drive carefully. NOTE TO TRIAGER - DRIVING: * Another adult should drive. CARE ADVICE given per Abdominal Pain, Upper (Adult) guideline. Comments User: Wayne Sever, RN Date/Time Eilene Ghazi Time): 10/29/2020 8:40:26 AM Spoke to [place] at the office. She states there are no available appts today & the office  is closed tomorrow. Notified the patient & asked if he would consider going to a nearby UC. He said no, he would figure out what to do. Referrals GO  TO FACILITY REFUSED

## 2020-10-29 NOTE — Telephone Encounter (Signed)
Due to there being a large influx of referrals the GI offices are currently scheduling appointments as far out as late Sept/Oct 2022. They have asked that the patients call their office regarding their referral and scheduling needs. Otherwise, they will reach out to them as soon as they are able. They are working diligently to get patients called and scheduled as soon as possible.  Colonoscopies will be called according to date/time of the referral entry as these are not of urgent need.    [facility]  [phone]   The patient needs to call and can inquire about their referral. I do not see that [facility] has reviewed the referral - since its not marked "urgent" they will unfortunately get to it when they get to it.   I am attaching [facility] to this  message to maybe help expedite the referral some.

## 2020-10-29 NOTE — Telephone Encounter (Signed)
I spoke with Chad [name]; Chad [name] said today steady abd pain; pain level now is 10.Chad [name] said he is not going to ED but Chad [name] said he is going to go to [facility] now. Chad [name] put his wife on phone and I advised Mrs [name] if Chad [name] is hurting that badly should go to ED for eval; Chad [name] does not want to go to ED so will go to [facility]. Advised OK and will send note to DR G as FYI. Mrs Capozzoli said has not heard from GI referral Dr Darnell Level put in in June and request cb about that also. Sending note to [facility] referrals also.

## 2020-11-04 ENCOUNTER — Other Ambulatory Visit: Payer: Self-pay | Admitting: Family Medicine

## 2020-11-05 NOTE — Telephone Encounter (Signed)
According to 03/18/20 OV notes, pt has intolerance to statins.  Lvm asking pt to call back.  Need to know if pt is taking atorvastatin (not on current med list).

## 2020-11-06 NOTE — Telephone Encounter (Signed)
According to 03/18/20 OV notes, pt has intolerance to statins.  Lvm asking pt to call back.  Need to know if pt is taking atorvastatin (not on current med list).

## 2020-11-09 ENCOUNTER — Other Ambulatory Visit: Payer: Self-pay | Admitting: Family Medicine

## 2020-11-09 NOTE — Telephone Encounter (Signed)
Spoke to patient by telephone and was advised that his cardiologist told him that he needed to start back taking Atorvastatin. Patient stated that he has not had any problems with the medication since starting back on it. Patient stated that he has lost a lot of weight in the past year and that may contribute with him not having problems taking it now.

## 2020-11-10 NOTE — Telephone Encounter (Signed)
ERx 

## 2020-11-25 ENCOUNTER — Other Ambulatory Visit: Payer: Self-pay

## 2020-11-25 ENCOUNTER — Encounter: Payer: Self-pay | Admitting: Family Medicine

## 2020-11-25 ENCOUNTER — Ambulatory Visit (INDEPENDENT_AMBULATORY_CARE_PROVIDER_SITE_OTHER): Payer: Medicare Other | Admitting: Family Medicine

## 2020-11-25 VITALS — BP 122/62 | HR 66 | Temp 98.0°F | Ht 70.0 in | Wt 247.0 lb

## 2020-11-25 DIAGNOSIS — H9193 Unspecified hearing loss, bilateral: Secondary | ICD-10-CM | POA: Diagnosis not present

## 2020-11-25 DIAGNOSIS — K7460 Unspecified cirrhosis of liver: Secondary | ICD-10-CM | POA: Diagnosis not present

## 2020-11-25 DIAGNOSIS — E1169 Type 2 diabetes mellitus with other specified complication: Secondary | ICD-10-CM | POA: Diagnosis not present

## 2020-11-25 DIAGNOSIS — E1143 Type 2 diabetes mellitus with diabetic autonomic (poly)neuropathy: Secondary | ICD-10-CM

## 2020-11-25 DIAGNOSIS — R0981 Nasal congestion: Secondary | ICD-10-CM

## 2020-11-25 DIAGNOSIS — K7581 Nonalcoholic steatohepatitis (NASH): Secondary | ICD-10-CM

## 2020-11-25 DIAGNOSIS — Z23 Encounter for immunization: Secondary | ICD-10-CM

## 2020-11-25 DIAGNOSIS — H34812 Central retinal vein occlusion, left eye, with macular edema: Secondary | ICD-10-CM | POA: Diagnosis not present

## 2020-11-25 DIAGNOSIS — I2510 Atherosclerotic heart disease of native coronary artery without angina pectoris: Secondary | ICD-10-CM

## 2020-11-25 DIAGNOSIS — M79642 Pain in left hand: Secondary | ICD-10-CM

## 2020-11-25 DIAGNOSIS — E785 Hyperlipidemia, unspecified: Secondary | ICD-10-CM

## 2020-11-25 DIAGNOSIS — E113559 Type 2 diabetes mellitus with stable proliferative diabetic retinopathy, unspecified eye: Secondary | ICD-10-CM | POA: Diagnosis not present

## 2020-11-25 DIAGNOSIS — J302 Other seasonal allergic rhinitis: Secondary | ICD-10-CM | POA: Diagnosis not present

## 2020-11-25 DIAGNOSIS — K3184 Gastroparesis: Secondary | ICD-10-CM

## 2020-11-25 DIAGNOSIS — E119 Type 2 diabetes mellitus without complications: Secondary | ICD-10-CM

## 2020-11-25 LAB — POCT GLYCOSYLATED HEMOGLOBIN (HGB A1C): Hemoglobin A1C: 5.4 % (ref 4.0–5.6)

## 2020-11-25 MED ORDER — FLUTICASONE PROPIONATE 50 MCG/ACT NA SUSP: 2.0000 | Freq: Every day | NASAL | 6 refills | Status: DC

## 2020-11-25 NOTE — Progress Notes (Signed)
Patient ID: Chad [name], male    DOB: Jun 03, 1955, 65 y.o.   MRN: [id number]  This visit was conducted in person.  BP 122/62   Pulse 66   Temp 98 F (36.7 C) (Temporal)   Ht 5' 10"  (1.778 m)   Wt 247 lb (112 kg)   SpO2 97%   BMI 35.44 kg/m    CC: 3 mo DM f/u visit  Subjective:   HPI: Chad [name] is a 65 y.o. male presenting on 11/25/2020 for Diabetes (Here for 3 mo f/u.  Pt accompanied by wife, [name] Pulse- temp 97.5.)   Several months of nasal congestion mainly at night time. Tried vicks vaporub last night with benefit. Does not have flonase anymore - requests refill.   L hand pain leading to hand weakness. Treating with voltaren gel without benefit. Icy hot does help. Fingers can get in a locked flexion position.   [facility] cardiology 08/2020. Known HFpEF currently off [facility] program and overall doing well.  Upcoming appt tomorrow with GI for liver cirrhosis as well as abd pain and nausea with meals. Notes increased constipation but he did have a normal bowel movement today.   He's been staying active - and notes decreased appetite.   DM - does regularly check sugars. Compliant with antihyperglycemic regimen which includes: farxiga 11m daily, ozempic 0.571mweekly. He is no longer on glargine or metformin. Denies low sugars or hypoglycemic symptoms. Denies paresthesias, blurry vision. Last diabetic eye exam 11/2019 - needs to schedule f/u appt. Glucometer brand: accuchek. Last foot exam: 08/2020. DSME: completed remotely. Lab Results  Component Value Date   HGBA1C 5.4 11/25/2020   Diabetic Foot Exam - Simple   No data filed    Lab Results  Component Value Date   MICROALBUR <0.7 03/12/2020        Relevant past medical, surgical, family and social history reviewed and updated as indicated. Interim medical history since our last visit reviewed. Allergies and medications reviewed and updated. Outpatient Medications Prior to Visit  Medication Sig Dispense Refill    aspirin 81 MG chewable tablet Chew 81 mg by mouth daily.     atorvastatin (LIPITOR) 20 MG tablet TAKE 1 TABLET BY MOUTH ONCE DAILY 90 tablet 1   bumetanide (BUMEX) 2 MG tablet Take 2 mg by mouth daily. Additional  2 mg if gain 3 lbs     citalopram (CELEXA) 20 MG tablet TAKE 1 TABLET BY MOUTH ONCE DAILY 90 tablet 1   dapagliflozin propanediol (FARXIGA) 10 MG TABS tablet Take 10 mg by mouth daily before breakfast.     DEXILANT 60 MG capsule TAKE 1 CAPSULE BY MOUTH ONCE DAILY 30 capsule 8   diazepam (VALIUM) 5 MG tablet Take 1 tablet (5 mg total) by mouth every 12 (twelve) hours as needed for anxiety. 30 tablet 1   famotidine (PEPCID) 20 MG tablet TAKE 1 TABLET BY MOUTH EVERY NIGHT AT BEDTIME (Patient taking differently: Take 20 mg by mouth daily.) 90 tablet 1   ferrous sulfate 325 (65 FE) MG EC tablet Take 1 tablet (325 mg total) by mouth daily with breakfast.     HYDROcodone-acetaminophen (NORCO/VICODIN) 5-325 MG tablet TAKE 1 TABLET BY MOUTH EVERY 6 HOURS AS NEEDED FOR MODERATE PAIN 15 tablet 0   isosorbide mononitrate (IMDUR) 60 MG 24 hr tablet TAKE 2 TABLETS BY MOUTH DAILY AND 1 TABLET AT BEDTIME 90 tablet 6   levothyroxine (SYNTHROID) 125 MCG tablet TAKE 1 TAB BY MOUTH ONCE DAILY.  TAKE ON AN EMPTY STOMACH WITH A GLASS OF WATER ATLEAST 30-60 MINUTES BEFORE BREAKFAST 30 tablet 5   Magnesium 400 MG TABS Take 400 mg by mouth 2 (two) times daily.     methocarbamol (ROBAXIN) 500 MG tablet Take 1 tablet (500 mg total) by mouth 2 (two) times daily as needed for muscle spasms (sedation precautions). 20 tablet 0   metoprolol succinate (TOPROL-XL) 25 MG 24 hr tablet TAKE 1 TABLET BY MOUTH EVERY MORNING 30 tablet 5   Multiple Vitamin (MULITIVITAMIN WITH MINERALS) TABS Take 1 tablet by mouth daily.     nitroGLYCERIN (NITROSTAT) 0.4 MG SL tablet Place 1 tablet (0.4 mg total) under the tongue every 5 (five) minutes as needed for chest pain. 60 tablet 3   OZEMPIC, 0.25 OR 0.5 MG/DOSE, 2 MG/1.5ML SOPN INJECT 0.5  MG INTO THE SKIN ONCE A WEEK AS DIRECTED 1.5 mL 5   potassium chloride (KLOR-CON) 10 MEQ tablet Take 1 tablet (10 mEq total) by mouth daily. 90 tablet 3   spironolactone (ALDACTONE) 25 MG tablet Take 25 mg by mouth daily.     VENTOLIN HFA 108 (90 Base) MCG/ACT inhaler INHALE 2 PUFFS INTO THE LUNGS EVERY 6 HOURS AS NEEDED FOR WHEEZING OR SHORTNESS OF BREATH 18 g 5   vitamin B-12 (CYANOCOBALAMIN) 500 MCG tablet Take 1 tablet (500 mcg total) by mouth daily.     vitamin E 400 UNIT capsule Take 400 Units by mouth daily.     fluticasone (FLONASE) 50 MCG/ACT nasal spray PLACE 2 SPRAYS INTO BOTH NOSTRILS DAILY (Patient taking differently: Place 2 sprays into both nostrils daily as needed for allergies.) 16 g 2   No facility-administered medications prior to visit.     Per HPI unless specifically indicated in ROS section below Review of Systems  Objective:  BP 122/62   Pulse 66   Temp 98 F (36.7 C) (Temporal)   Ht 5' 10"  (1.778 m)   Wt 247 lb (112 kg)   SpO2 97%   BMI 35.44 kg/m   Wt Readings from Last 3 Encounters:  11/26/20 244 lb (110.7 kg)  11/25/20 247 lb (112 kg)  08/25/20 246 lb (111.6 kg)      Physical Exam Vitals and nursing note reviewed.  Constitutional:      Appearance: Normal appearance. He is not ill-appearing.  Eyes:     Extraocular Movements: Extraocular movements intact.     Conjunctiva/sclera: Conjunctivae normal.     Pupils: Pupils are equal, round, and reactive to light.  Cardiovascular:     Rate and Rhythm: Normal rate and regular rhythm.     Pulses: Normal pulses.     Heart sounds: Normal heart sounds. No murmur heard. Pulmonary:     Effort: Pulmonary effort is normal. No respiratory distress.     Breath sounds: Normal breath sounds. No wheezing, rhonchi or rales.  Abdominal:     General: Bowel sounds are increased. There is no distension.     Palpations: Abdomen is soft. There is no mass.     Tenderness: There is no abdominal tenderness. There is no  right CVA tenderness, left CVA tenderness, guarding or rebound. Negative signs include Murphy's sign.     Hernia: No hernia is present.  Musculoskeletal:        General: Tenderness present.     Right lower leg: No edema.     Left lower leg: No edema.     Comments:  See HPI for foot exam if done Point tender  to palpation at palmar 3rd L MCP and just proximal without redness, warmth or obvious nodule  Skin:    General: Skin is warm and dry.     Findings: No rash.  Neurological:     Mental Status: He is alert.  Psychiatric:        Mood and Affect: Mood normal.        Behavior: Behavior normal.      Results for orders placed or performed in visit on 11/25/20  POCT glycosylated hemoglobin (Hb A1C)  Result Value Ref Range   Hemoglobin A1C 5.4 4.0 - 5.6 %   HbA1c POC (<> result, manual entry)     HbA1c, POC (prediabetic range)     HbA1c, POC (controlled diabetic range)      Assessment & Plan:  This visit occurred during the SARS-CoV-2 public health emergency.  Safety protocols were in place, including screening questions prior to the visit, additional usage of staff PPE, and extensive cleaning of exam room while observing appropriate contact time as indicated for disinfecting solutions.   Problem List Items Addressed This Visit     Controlled type 2 diabetes mellitus with retinopathy ([place]) - Primary    Chronic, remains well controlled with A1c 5.4%.  Given recent abdominal symptoms (discomfort, nausea, constipation), suggested trial off ozempic to see effect on this.  I also did recommend he check cbg whenever lightheadedness develops.  Referral placed to [facility] center for diabetic eye exam.       Relevant Orders   POCT glycosylated hemoglobin (Hb A1C) (Completed)   Ambulatory referral to Ophthalmology   Hyperlipidemia associated with type 2 diabetes mellitus ([place])    H/o statin intolerance. Seems to be tolerating atorvastatin 18m daily - continue.  Lab Results   Component Value Date   LDLCALC 55 03/12/2020         Seasonal allergies    Will stat flonase - refilled.       Diabetic [place]roparesis ([place]    Will trial off GLP1RA.       Central retinal vein occlusion with macular edema of left eye    Does not currently have eye doctor - [place]       Relevant Orders   Ambulatory referral to Ophthalmology   Liver cirrhosis secondary to NASH ([facility]    Upcoming liver clinic appt.  Elevated Fib4 score of 3.09 (07/2020).  AMA negative 07/2020      Bilateral hearing loss    Not using hearing aides due to cost. Previously dealt with recurrent ear infections - will refer back to audiology.       Relevant Orders   Ambulatory referral to Audiology   Sinus congestion    Not consistent with sinusitis, anticipate allergic cause - discussed flonase use and refilled, update if develops symptoms of infection.       Left hand pain    Unclear cause, possible stenosing tenosynovitis however doesn't describe typical triggering. Not consistent with contracture. Will check hand xray, continue voltaren gel use.       Relevant Orders   DG Hand Complete Left   Other Visit Diagnoses     Need for influenza vaccination       Relevant Orders   Flu Vaccine QUAD High Dose(Fluad) (Completed)        Meds ordered this encounter  Medications   fluticasone (FLONASE) 50 MCG/ACT nasal spray    Sig: Place 2 sprays into both nostrils daily.  Dispense:  16 g    Refill:  6   Orders Placed This Encounter  Procedures   DG Hand Complete Left    Standing Status:   Future    Standing Expiration Date:   11/25/2021    Order Specific Question:   Reason for Exam (SYMPTOM  OR DIAGNOSIS REQUIRED)    Answer:   left hand pain at palmar 3rd MCP    Order Specific Question:   Preferred imaging location?    Answer:   [facility]   Flu Vaccine QUAD High Dose(Fluad)   Ambulatory referral to Audiology    Referral Priority:   Routine     Referral Type:   Audiology Exam    Referral Reason:   Specialty Services Required    Number of Visits Requested:   1   Ambulatory referral to Ophthalmology    Referral Priority:   Routine    Referral Type:   Consultation    Referral Reason:   Specialty Services Required    Requested Specialty:   Ophthalmology    Number of Visits Requested:   1   POCT glycosylated hemoglobin (Hb A1C)     Patient Instructions  Flu shot today  We will refer you to hearing doctor.  Use flonase for nasal congestion worse at night time. Let us know if any fevers or worsening sinus pressure headaches despite using flonase.  Check sugars when you feel lightheaded.  Call to schedule eye exam as you will be due this month. We will refer you to [facility].  We will refer you to skin doctor.  Get left hand xray at [facility] regional outpatient imaging center.  Sugar is doing great - hold ozempic for the next 2-3 weeks to see if any improvement in abdominal pain.  Return in 3 months for follow up visit.  Follow up plan: Return if symptoms worsen or fail to improve.  [name], MD

## 2020-11-25 NOTE — Patient Instructions (Addendum)
Flu shot today  We will refer you to hearing doctor.  Use flonase for nasal congestion worse at night time. Let us know if any fevers or worsening sinus pressure headaches despite using flonase.  Check sugars when you feel lightheaded.  Call to schedule eye exam as you will be due this month. We will refer you to [place] [facility].  We will refer you to skin doctor.  Get left hand xray at [facility] regional outpatient imaging center.  Sugar is doing great - hold ozempic for the next 2-3 weeks to see if any improvement in abdominal pain.  Return in 3 months for follow up visit.

## 2020-11-26 ENCOUNTER — Encounter: Payer: Self-pay | Admitting: Nurse Practitioner

## 2020-11-26 ENCOUNTER — Other Ambulatory Visit (INDEPENDENT_AMBULATORY_CARE_PROVIDER_SITE_OTHER): Payer: Medicare Other

## 2020-11-26 ENCOUNTER — Ambulatory Visit (INDEPENDENT_AMBULATORY_CARE_PROVIDER_SITE_OTHER): Payer: Medicare Other | Admitting: Nurse Practitioner

## 2020-11-26 VITALS — BP 110/60 | HR 69 | Ht 70.0 in | Wt 244.0 lb

## 2020-11-26 DIAGNOSIS — K7581 Nonalcoholic steatohepatitis (NASH): Secondary | ICD-10-CM

## 2020-11-26 DIAGNOSIS — R1030 Lower abdominal pain, unspecified: Secondary | ICD-10-CM | POA: Diagnosis not present

## 2020-11-26 DIAGNOSIS — R197 Diarrhea, unspecified: Secondary | ICD-10-CM

## 2020-11-26 DIAGNOSIS — R0981 Nasal congestion: Secondary | ICD-10-CM | POA: Insufficient documentation

## 2020-11-26 DIAGNOSIS — K7460 Unspecified cirrhosis of liver: Secondary | ICD-10-CM | POA: Diagnosis not present

## 2020-11-26 DIAGNOSIS — A048 Other specified bacterial intestinal infections: Secondary | ICD-10-CM

## 2020-11-26 DIAGNOSIS — M79642 Pain in left hand: Secondary | ICD-10-CM | POA: Insufficient documentation

## 2020-11-26 LAB — CBC WITH DIFFERENTIAL/PLATELET
Basophils Absolute: 0 10*3/uL (ref 0.0–0.1)
Basophils Relative: 0.3 % (ref 0.0–3.0)
Eosinophils Absolute: 0.1 10*3/uL (ref 0.0–0.7)
Eosinophils Relative: 1.1 % (ref 0.0–5.0)
HCT: 47.2 % (ref 39.0–52.0)
Hemoglobin: 16 g/dL (ref 13.0–17.0)
Lymphocytes Relative: 12.2 % (ref 12.0–46.0)
Lymphs Abs: 0.6 10*3/uL — ABNORMAL LOW (ref 0.7–4.0)
MCHC: 33.8 g/dL (ref 30.0–36.0)
MCV: 86.8 fl (ref 78.0–100.0)
Monocytes Absolute: 0.5 10*3/uL (ref 0.1–1.0)
Monocytes Relative: 9.6 % (ref 3.0–12.0)
Neutro Abs: 4 10*3/uL (ref 1.4–7.7)
Neutrophils Relative %: 76.8 % (ref 43.0–77.0)
Platelets: 145 10*3/uL — ABNORMAL LOW (ref 150.0–400.0)
RBC: 5.44 Mil/uL (ref 4.22–5.81)
RDW: 13.7 % (ref 11.5–15.5)
WBC: 5.2 10*3/uL (ref 4.0–10.5)

## 2020-11-26 LAB — BASIC METABOLIC PANEL
BUN: 14 mg/dL (ref 6–23)
CO2: 33 mEq/L — ABNORMAL HIGH (ref 19–32)
Calcium: 9.2 mg/dL (ref 8.4–10.5)
Chloride: 98 mEq/L (ref 96–112)
Creatinine, Ser: 1.06 mg/dL (ref 0.40–1.50)
GFR: 73.52 mL/min (ref >60.00)
Glucose, Bld: 103 mg/dL — ABNORMAL HIGH (ref 70–99)
Potassium: 3.6 mEq/L (ref 3.5–5.1)
Sodium: 138 mEq/L (ref 135–145)

## 2020-11-26 LAB — HEPATIC FUNCTION PANEL
ALT: 33 U/L (ref 0–53)
AST: 44 U/L — ABNORMAL HIGH (ref 0–37)
Albumin: 4.3 g/dL (ref 3.5–5.2)
Alkaline Phosphatase: 231 U/L — ABNORMAL HIGH (ref 39–117)
Bilirubin, Direct: 0.4 mg/dL — ABNORMAL HIGH (ref 0.0–0.3)
Total Bilirubin: 1.7 mg/dL — ABNORMAL HIGH (ref 0.2–1.2)
Total Protein: 6.6 g/dL (ref 6.0–8.3)

## 2020-11-26 LAB — PROTIME-INR
INR: 1.1 ratio — ABNORMAL HIGH (ref 0.8–1.0)
Prothrombin Time: 12.3 s (ref 9.6–13.1)

## 2020-11-26 NOTE — Assessment & Plan Note (Addendum)
Upcoming liver clinic appt.  Elevated Fib4 score of 3.09 (07/2020).  AMA negative 07/2020

## 2020-11-26 NOTE — Assessment & Plan Note (Addendum)
Chronic, remains well controlled with A1c 5.4%.  Given recent abdominal symptoms (discomfort, nausea, constipation), suggested trial off ozempic to see effect on this.  I also did recommend he check cbg whenever lightheadedness develops.  Referral placed to [facility] center for diabetic eye exam.

## 2020-11-26 NOTE — Assessment & Plan Note (Signed)
Not using hearing aides due to cost. Previously dealt with recurrent ear infections - will refer back to audiology.

## 2020-11-26 NOTE — Assessment & Plan Note (Signed)
Will stat flonase - refilled.

## 2020-11-26 NOTE — Assessment & Plan Note (Signed)
Will trial off GLP1RA.

## 2020-11-26 NOTE — Patient Instructions (Addendum)
If you are age 65 or older, your body mass index should be between 23-30. Your Body mass index is 35.01 kg/m. If this is out of the aforementioned range listed, please consider follow up with your Primary Care Provider.  The  GI providers would like to encourage you to use [facility] to communicate with providers for non-urgent requests or questions.  Due to long hold times on the telephone, sending your provider a message by [facility] may be faster and more efficient way to get a response. Please allow 48 business hours for a response.  Please remember that this is for non-urgent requests/questions.  LABS:  Lab work has been ordered for you today. Our lab is located in the basement. Press "B" on the elevator. The lab is located at the first door on the left as you exit the elevator.  HEALTHCARE LAWS AND MY CHART RESULTS: Due to recent changes in healthcare laws, you may see the results of your imaging and laboratory studies on MyChart before your provider has had a chance to review them.   We understand that in some cases there may be results that are confusing or concerning to you. Not all laboratory results come back in the same time frame and the provider may be waiting for multiple results in order to interpret others.  Please give [place] 48 hours in order for your provider to thoroughly review all the results before contacting the office for clarification of your results.   RECOMMENDATIONS: You have been given the hemoccult cards, only complete them when you have black stool. Our office will contact you to schedule an abdominal pelvic CT scan after lab results are reviewed.  It was great seeing you today! Thank you for entrusting me with your care and choosing [facility].  Noralyn Pick, CRNP

## 2020-11-26 NOTE — Assessment & Plan Note (Signed)
H/o statin intolerance. Seems to be tolerating atorvastatin 71m daily - continue.  Lab Results  Component Value Date   LDLCALC 55 03/12/2020

## 2020-11-26 NOTE — Assessment & Plan Note (Addendum)
Does not currently have eye doctor - will refer to establish with [facility].

## 2020-11-26 NOTE — Assessment & Plan Note (Addendum)
Unclear cause, possible stenosing tenosynovitis however doesn't describe typical triggering. Not consistent with contracture. Will check hand xray, continue voltaren gel use.

## 2020-11-26 NOTE — Progress Notes (Signed)
11/26/2020 Chad [name] [id number] 23-Aug-1955   Chief Complaint: Lower abdominal pain   History of Present Illness: Chad [name] is a 65 year old male with a past medical history of CAD s/p stent placement 2018 on ASA, CHF, RBBB, DM II, diabetic gastroparesis, colon polyps and NASH cirrhosis with portal hypertension and splenomegaly.  Past cholecystectomy and gastric stapling surgery 1980's. He is followed by Dr. Hilarie Fredrickson. He presents to our office today with complaints of having daily central to lower abdominal pain with nausea which started 4 months ago. He started taking potassium po about 6 months ago and he questions if it triggered his abdominal pain. He developed generalized fatigue and weakness 6 months ago and he presented to the [facility] and he was found to have a low potassium level, he received potassium IV/po and he was observed overnight then discharged home the next day. He often feels sick to his stomach after eating, no vomiting. No specific food triggers. History of gastroparesis with nausea x 3 to 4 months. He is not taking Reglan. He previously passed a normal formed brown BM daily but for the past 6 months he is passing a mud like to watery black colored stools every 1 to 2 days. His stool have been black in color for the past year which he attributes to taking Ferrous Sulfate 356m three days weekly (for low ferritin levels).  No Pepto bismol use. No bright red rectal bleeding. He stopped taking iron 3 to 4 weeks ago and his stool color remains black. No longer on Plavix. He takes ASA QD. He sometimes feels constipated, no BM for a few days and takes Miralax as needed. No fever. No antibiotic use for the past 6 months. He was on Ozempic for the past 2 years which he stopped taking 3 weeks ago as advised by his PCP due to his current GI symptoms. He takes a magnesium supplement for many years. No history of GI bleeding. He has occasional heartburn on Dexilant 664mQD and  Famotidine 2095m HS. He underwent an EGD 02/17/2020 which showed a normal esophagus, no esophageal or gastric varices and an aberrant lumen connecting the gastric cardia distal to he GE junction to the gastric antrum likely due to his past gastric bypass surgery was noted. He underwent a colonoscopy on the same date which showed 2 inflammatory polyps, sigmoid diverticulosis and internal hemorrhoids. No weight loss or weight gain. His leg edema has improved. He remains on Bumex and Spironolactone. No confusion.    CBC Latest Ref Rng & Units 07/31/2020 03/12/2020 10/02/2019  WBC 4.0 - 10.5 K/uL 6.1 4.7 6.5  Hemoglobin 13.0 - 17.0 g/dL 15.7 14.9 14.5  Hematocrit 39.0 - 52.0 % 46.7 44.6 44.5  Platelets 150.0 - 400.0 K/uL 141.0(L) 142.0(L) 167.0    CMP Latest Ref Rng & Units 08/25/2020 07/31/2020 03/18/2020  Glucose 70 - 99 mg/dL 105(H) 110(H) -  BUN 6 - 23 mg/dL 16 19 -  Creatinine 0.40 - 1.50 mg/dL 1.26 1.09 -  Sodium 135 - 145 mEq/L 139 139 -  Potassium 3.5 - 5.1 mEq/L 4.3 3.3(L) -  Chloride 96 - 112 mEq/L 98 97 -  CO2 19 - 32 mEq/L 33(H) 30 -  Calcium 8.4 - 10.5 mg/dL 9.2 9.2 -  Total Protein 6.0 - 8.3 g/dL - 6.3 -  Total Bilirubin 0.2 - 1.2 mg/dL - 2.4(H) -  Alkaline Phos 39 - 117 U/L - 223(H) 287(H)  AST 0 -  37 U/L - 42(H) -  ALT 0 - 53 U/L - 33 -  AMA  < 20 on 07/31/2020 AFP 3.6 on 10/22/2019 ANA negative and SMA negative 08/03/2015 Hep A total an negative, Hep B surface ag negative, Hep B core total ab negative and Hep B surface antibody negative 08/03/2015 Hep C ab negative and HIV negative 04/03/2015 Hep C ab negative 03/2019 Elevated Fib4 score of 3.09 on 07/2020   Abdominal MRI w/wo contrast 11/21/2019: 1. Cirrhosis and hepatomegaly. No evidence of hepatocellular carcinoma. 2. Splenomegaly without specific evidence of portal venous hypertension. 3. Benign right adrenal myelolipoma. 4.  Aortic Atherosclerosis   RUQ sono 03/31/2020: 1. Hepatic cirrhosis. No focal intrahepatic lesions  identified. 2. Splenomegaly, consistent with portal hypertension.  No ascites. 3. Prior cholecystectomy. No biliary dilatation. 4. Otherwise unremarkable abdominal ultrasound.  EGD 02/17/2020: - Normal esophagus. - No evidence of esophageal or gastric varices. - Aberrant lumen (as described above) found connecting the gastric cardia immediately distal to the GEJ to the gastric incisura/antrum, characterized by healthy appearing mucosa. Biopsied. Likely related to prior gastric bypass (though certainly not traditional). - Normal examined duodenum. - Surveillance EGD 2 years   Colonoscopy 02/17/2020: - Two 2 to 5 mm polyps in the ascending colon, removed with a cold snare. Resected and retrieved. - Diverticulosis in the sigmoid colon. - Otherwise normal mucosa in the entire examined colon. Biopsied to exclude microscopic colitis. - Small internal hemorrhoids. - 5 year recall colonoscopy FINAL MICROSCOPIC DIAGNOSIS:  A. STOMACH, BIOPSY:  - Gastric antral and oxyntic mucosa with chronic gastritis  - Warthin Starry stain is negative for Helicobacter pylori   B. COLON, ASCENDING, POLYPECTOMY:  - Inflammatory polyp  - Negative for dysplasia   C. COLON, RANDOM, BIOPSY:  - Colonic mucosa with no specific histopathologic changes  - Negative for acute inflammation, increased intraepithelial lymphocytes  or thickened subepithelial collagen table   Current Outpatient Medications on File Prior to Visit  Medication Sig Dispense Refill   aspirin 81 MG chewable tablet Chew 81 mg by mouth daily.     atorvastatin (LIPITOR) 20 MG tablet TAKE 1 TABLET BY MOUTH ONCE DAILY 90 tablet 1   bumetanide (BUMEX) 2 MG tablet Take 2 mg by mouth daily. Additional  2 mg if gain 3 lbs     citalopram (CELEXA) 20 MG tablet TAKE 1 TABLET BY MOUTH ONCE DAILY 90 tablet 1   dapagliflozin propanediol (FARXIGA) 10 MG TABS tablet Take 10 mg by mouth daily before breakfast.     DEXILANT 60 MG capsule TAKE 1 CAPSULE BY  MOUTH ONCE DAILY 30 capsule 8   diazepam (VALIUM) 5 MG tablet Take 1 tablet (5 mg total) by mouth every 12 (twelve) hours as needed for anxiety. 30 tablet 1   famotidine (PEPCID) 20 MG tablet TAKE 1 TABLET BY MOUTH EVERY NIGHT AT BEDTIME (Patient taking differently: Take 20 mg by mouth daily.) 90 tablet 1   ferrous sulfate 325 (65 FE) MG EC tablet Take 1 tablet (325 mg total) by mouth daily with breakfast.     fluticasone (FLONASE) 50 MCG/ACT nasal spray Place 2 sprays into both nostrils daily. 16 g 6   HYDROcodone-acetaminophen (NORCO/VICODIN) 5-325 MG tablet TAKE 1 TABLET BY MOUTH EVERY 6 HOURS AS NEEDED FOR MODERATE PAIN 15 tablet 0   isosorbide mononitrate (IMDUR) 60 MG 24 hr tablet TAKE 2 TABLETS BY MOUTH DAILY AND 1 TABLET AT BEDTIME 90 tablet 6   levothyroxine (SYNTHROID) 125 MCG tablet TAKE 1 TAB  BY MOUTH ONCE DAILY. TAKE ON AN EMPTY STOMACH WITH A GLASS OF WATER ATLEAST 30-60 MINUTES BEFORE BREAKFAST 30 tablet 5   Magnesium 400 MG TABS Take 400 mg by mouth 2 (two) times daily.     methocarbamol (ROBAXIN) 500 MG tablet Take 1 tablet (500 mg total) by mouth 2 (two) times daily as needed for muscle spasms (sedation precautions). 20 tablet 0   metoprolol succinate (TOPROL-XL) 25 MG 24 hr tablet TAKE 1 TABLET BY MOUTH EVERY MORNING 30 tablet 5   Multiple Vitamin (MULITIVITAMIN WITH MINERALS) TABS Take 1 tablet by mouth daily.     nitroGLYCERIN (NITROSTAT) 0.4 MG SL tablet Place 1 tablet (0.4 mg total) under the tongue every 5 (five) minutes as needed for chest pain. 60 tablet 3   OZEMPIC, 0.25 OR 0.5 MG/DOSE, 2 MG/1.5ML SOPN INJECT 0.5 MG INTO THE SKIN ONCE A WEEK AS DIRECTED 1.5 mL 5   potassium chloride (KLOR-CON) 10 MEQ tablet Take 1 tablet (10 mEq total) by mouth daily. 90 tablet 3   spironolactone (ALDACTONE) 25 MG tablet Take 25 mg by mouth daily.     VENTOLIN HFA 108 (90 Base) MCG/ACT inhaler INHALE 2 PUFFS INTO THE LUNGS EVERY 6 HOURS AS NEEDED FOR WHEEZING OR SHORTNESS OF BREATH 18 g 5    vitamin B-12 (CYANOCOBALAMIN) 500 MCG tablet Take 1 tablet (500 mcg total) by mouth daily.     vitamin E 400 UNIT capsule Take 400 Units by mouth daily.     No current facility-administered medications on file prior to visit.   Allergies  Allergen Reactions   Nsaids Palpitations and Other (See Comments)    ACID REFLUX    Statins Other (See Comments)    MYALGIAS, LEG CRAMPS   Librax [Chlordiazepoxide-Clidinium] Other (See Comments)    Urinary retention    Tessalon [Benzonatate] Other (See Comments)    Unable to swallow due to GERD - caused throat numbness   Codeine Nausea Only   Sulfa Drugs Cross Reactors Nausea Only    Current Medications, Allergies, Past Medical History, Past Surgical History, Family History and Social History were reviewed in [employer] record.   Review of Systems:   Constitutional: Negative for fever, sweats, chills or weight loss.  Respiratory: + DOE. No cough or hemoptysis.  Cardiovascular: + Left swelling reduced. Negative for chest pain or palpitations.  Gastrointestinal: See HPI.  Musculoskeletal: Negative for back pain or muscle aches.  Neurological: Negative for dizziness, headaches or paresthesias.    Physical Exam: BP 110/60   Pulse 69   Ht 5' 10"  (1.778 m)   Wt 244 lb (110.7 kg)   SpO2 96%   BMI 35.01 kg/m  Wt Readings from Last 3 Encounters:  11/26/20 244 lb (110.7 kg)  11/25/20 247 lb (112 kg)  08/25/20 246 lb (111.6 kg)    General: 65 year old male in no acute distress. Head: Normocephalic and atraumatic. Eyes: No scleral icterus. Conjunctiva pink . Ears: Normal auditory acuity. Mouth: Dentition intact. No ulcers or lesions.  Lungs: Clear throughout to auscultation. Heart: Regular rate and rhythm, no murmur. Abdomen: Soft, nontender and nondistended. No masses or hepatomegaly. Normal bowel sounds x 4 quadrants.  Rectal: No external hemorrhoids. Anal hemorrhoids without prolapse. Smear of brown stool  guaiac negative. No melena. Enlarged prostate. No mass within reach. Wife present during exam.  Musculoskeletal: Symmetrical with no gross deformities. Extremities: Trace LE edema. Neurological: Alert oriented x 4. No focal deficits. No asterixis.  Psychological: Alert  and cooperative. Normal mood and affect  Assessment and Recommendations: 80) 65 year old male with NAFLD cirrhosis with associated portal hypertension and splenomegaly. Central to lower abdominal pain x 4 months. No ascites. No overt hepatic encephalopathy. Elevated Alk phos, T. Bili, ALT and GGT levels. Normal AMA level.  -CBC, BMP, hepatic panel, PT/INR, AFP. Calculate MELD score after lab results completed.  -CTAP with oral and IV contrast due to central and lower abdominal pain, await BUN/Cr/GFR level prior to scheduling -Eventual surveillance abdominal MRI w/wo contrast (he was due for a surveillance abdominal MRI 10/2020) -2gm low sodium diet   2) Diarrhea secondary to Miralax, low suspicion for infectious etiology but will check a GI pathogen panel  -GI pathogen panel   3) Constipation  -Hold Miralax   4) GERD. Reported black colored stools while on oral iron which persisted despite off iron x 3 weeks. Rectal exam today showed brown stool guaiac negative.  -Heme slides, patient to complete on days he sees a black colored stool -CBC  -Continue Dexilant -May increase Famotidine 87m Q HS  5) Thrombocytopenia secondary to cirrhosis and splenomegaly   6) History of gastroparesis with nausea without vomiting x 3 to 4 months. His past gastric stapling may be contributing to his UGI symptoms.  -Advised 3 to 4 small snack sized meals -CTAP as noted above  -? Restart Reglan  7) History of colon polyps -Next colonoscopy due 01/2025  8) CAD s/p Des in 2018 on ASA, Lipitor, Metoprolol and Imdur. No longer on Plavix. Chronic DOE without CP.   9) CHF on Bumex and Furosemide. LV EF 60 -65% per ECHO 11/2019.  10) Renal  insufficiency  11) DM II  -Agree with holding Ozempic   Today's encounter was 45 minutes in total which included extensive chart/result review, history/exam, counseling, formulating a treatment plan and documentation.

## 2020-11-26 NOTE — Assessment & Plan Note (Addendum)
Not consistent with sinusitis, anticipate allergic cause - discussed flonase use and refilled, update if develops symptoms of infection.

## 2020-11-27 LAB — AFP TUMOR MARKER: AFP-Tumor Marker: 3.9 ng/mL (ref <6.1)

## 2020-11-30 ENCOUNTER — Other Ambulatory Visit: Payer: Self-pay

## 2020-11-30 ENCOUNTER — Telehealth: Payer: Self-pay | Admitting: Nurse Practitioner

## 2020-11-30 DIAGNOSIS — K7460 Unspecified cirrhosis of liver: Secondary | ICD-10-CM

## 2020-11-30 MED ORDER — METOCLOPRAMIDE HCL 5 MG PO TABS
5.0000 mg | ORAL_TABLET | Freq: Three times a day (TID) | ORAL | 1 refills | Status: DC
Start: 2020-11-30 — End: 2021-02-25

## 2020-11-30 NOTE — Progress Notes (Signed)
Addendum: Reviewed and agree with assessment and management plan. I believe we can obtain enough information by repeating MRI abdomen with and without contrast.  This will suffice for [facility] screening but also help evaluate his abdominal pain. I would hold off on CT for now Reasonable to retry metoclopramide 5 mg 3 times daily before meals and at bedtime given history of gastroparesis and recurrent nausea and abdominal pain. Lab work revealed slight improvement in bilirubin, stable elevation in alkaline phosphatase and mild elevation in AST.  INR also stable which is reassuring Mikaele Stecher, Lajuan Lines, MD

## 2020-11-30 NOTE — Telephone Encounter (Signed)
Patty, I am sending you this as you responded to the lab note msg I sent Beth, thank you for covering for Beth while she is off work today. My earlier  msg was to contact the patient and schedule him for a CTAP. Pls change that to an abdominal MRI. Looks like you haven't reached the patient yet but left a msg for him to call you. So cancel prior request for CTAP and schedule him for an abdominal MRI with and without contrast. Refer to office consult note and Dr. Vena Rua addendum as copied below.   Also pls send in RX for Metoclopramide 1m one po tid before meals and at bed time. # 120, 1 refill thx.  Addendum: Reviewed and agree with assessment and management plan. I believe we can obtain enough information by repeating MRI abdomen with and without contrast.  This will suffice for [facility] but also help evaluate his abdominal pain. I would hold off on CT for now Reasonable to retry metoclopramide 5 mg 3 times daily before meals and at bedtime given history of gastroparesis and recurrent nausea and abdominal pain. Lab work revealed slight improvement in bilirubin, stable elevation in alkaline phosphatase and mild elevation in AST.  INR also stable which is reassuring

## 2020-11-30 NOTE — Telephone Encounter (Signed)
MR abd order entered and sent to the schedulers Prescription sent to the pharmacy Left message on machine to call back

## 2020-11-30 NOTE — Progress Notes (Signed)
See phone note to nursing staff to contact patient, schedule abd MRI w/wo contrast (not CTAP).

## 2020-12-01 NOTE — Telephone Encounter (Signed)
Left message with family member to have pt return call

## 2020-12-02 NOTE — Telephone Encounter (Signed)
Left message on machine to call back  

## 2020-12-03 NOTE — Telephone Encounter (Signed)
The pt has been advised of the recommendations and has been scheduled for MRI.  He has picked up the prescription for reglan.  All questions answered.

## 2020-12-11 ENCOUNTER — Ambulatory Visit
Admission: RE | Admit: 2020-12-11 | Discharge: 2020-12-11 | Disposition: A | Discharge status: Home / Self Care | Payer: Medicare Other | Source: Ambulatory Visit | Attending: Internal Medicine | Admitting: Internal Medicine

## 2020-12-11 ENCOUNTER — Other Ambulatory Visit: Payer: Self-pay

## 2020-12-11 DIAGNOSIS — K7460 Unspecified cirrhosis of liver: Secondary | ICD-10-CM

## 2020-12-11 DIAGNOSIS — Z9049 Acquired absence of other specified parts of digestive tract: Secondary | ICD-10-CM | POA: Diagnosis not present

## 2020-12-11 DIAGNOSIS — K7581 Nonalcoholic steatohepatitis (NASH): Secondary | ICD-10-CM | POA: Diagnosis not present

## 2020-12-11 DIAGNOSIS — Z8505 Personal history of malignant neoplasm of liver: Secondary | ICD-10-CM | POA: Diagnosis not present

## 2020-12-11 DIAGNOSIS — R161 Splenomegaly, not elsewhere classified: Secondary | ICD-10-CM | POA: Diagnosis not present

## 2020-12-11 MED ORDER — GADOBUTROL 1 MMOL/ML IV SOLN
10.0000 mL | Freq: Once | INTRAVENOUS | Status: AC | PRN
  Administered 2020-12-11: 10 mL via INTRAVENOUS

## 2020-12-15 DIAGNOSIS — M2578 Osteophyte, vertebrae: Secondary | ICD-10-CM | POA: Diagnosis not present

## 2020-12-15 DIAGNOSIS — M47812 Spondylosis without myelopathy or radiculopathy, cervical region: Secondary | ICD-10-CM | POA: Diagnosis not present

## 2020-12-15 DIAGNOSIS — M4322 Fusion of spine, cervical region: Secondary | ICD-10-CM | POA: Diagnosis not present

## 2020-12-15 DIAGNOSIS — M65339 Trigger finger, unspecified middle finger: Secondary | ICD-10-CM | POA: Diagnosis not present

## 2020-12-15 DIAGNOSIS — M5412 Radiculopathy, cervical region: Secondary | ICD-10-CM | POA: Diagnosis not present

## 2020-12-15 DIAGNOSIS — Z981 Arthrodesis status: Secondary | ICD-10-CM | POA: Diagnosis not present

## 2020-12-16 ENCOUNTER — Other Ambulatory Visit: Payer: Self-pay | Admitting: Neurosurgery

## 2020-12-16 DIAGNOSIS — Z981 Arthrodesis status: Secondary | ICD-10-CM

## 2020-12-24 DIAGNOSIS — I700 Atherosclerosis of aorta: Secondary | ICD-10-CM | POA: Diagnosis not present

## 2020-12-24 DIAGNOSIS — M65341 Trigger finger, right ring finger: Secondary | ICD-10-CM | POA: Diagnosis not present

## 2020-12-24 DIAGNOSIS — Z6835 Body mass index (BMI) 35.0-35.9, adult: Secondary | ICD-10-CM | POA: Diagnosis not present

## 2020-12-24 DIAGNOSIS — M79642 Pain in left hand: Secondary | ICD-10-CM | POA: Diagnosis not present

## 2020-12-24 DIAGNOSIS — M65331 Trigger finger, right middle finger: Secondary | ICD-10-CM | POA: Diagnosis not present

## 2020-12-24 DIAGNOSIS — M65332 Trigger finger, left middle finger: Secondary | ICD-10-CM | POA: Diagnosis not present

## 2020-12-24 DIAGNOSIS — M79641 Pain in right hand: Secondary | ICD-10-CM | POA: Diagnosis not present

## 2020-12-28 ENCOUNTER — Ambulatory Visit
Admission: RE | Admit: 2020-12-28 | Discharge: 2020-12-28 | Disposition: A | Discharge status: Home / Self Care | Payer: Medicare Other | Source: Ambulatory Visit | Attending: Neurosurgery | Admitting: Neurosurgery

## 2020-12-28 DIAGNOSIS — M50223 Other cervical disc displacement at C6-C7 level: Secondary | ICD-10-CM | POA: Diagnosis not present

## 2020-12-28 DIAGNOSIS — M50221 Other cervical disc displacement at C4-C5 level: Secondary | ICD-10-CM | POA: Diagnosis not present

## 2020-12-28 DIAGNOSIS — M2578 Osteophyte, vertebrae: Secondary | ICD-10-CM | POA: Diagnosis not present

## 2020-12-28 DIAGNOSIS — Z981 Arthrodesis status: Secondary | ICD-10-CM | POA: Diagnosis not present

## 2020-12-28 DIAGNOSIS — M4602 Spinal enthesopathy, cervical region: Secondary | ICD-10-CM | POA: Diagnosis not present

## 2021-01-25 DIAGNOSIS — M79641 Pain in right hand: Secondary | ICD-10-CM | POA: Diagnosis not present

## 2021-01-25 DIAGNOSIS — M65331 Trigger finger, right middle finger: Secondary | ICD-10-CM | POA: Diagnosis not present

## 2021-01-25 DIAGNOSIS — M65341 Trigger finger, right ring finger: Secondary | ICD-10-CM | POA: Diagnosis not present

## 2021-01-25 DIAGNOSIS — M65332 Trigger finger, left middle finger: Secondary | ICD-10-CM | POA: Diagnosis not present

## 2021-01-25 DIAGNOSIS — M79642 Pain in left hand: Secondary | ICD-10-CM | POA: Diagnosis not present

## 2021-02-10 ENCOUNTER — Other Ambulatory Visit: Payer: Self-pay | Admitting: Family Medicine

## 2021-02-25 ENCOUNTER — Other Ambulatory Visit: Payer: Self-pay | Admitting: Internal Medicine

## 2021-03-01 ENCOUNTER — Other Ambulatory Visit: Payer: Self-pay

## 2021-03-01 ENCOUNTER — Ambulatory Visit (INDEPENDENT_AMBULATORY_CARE_PROVIDER_SITE_OTHER): Payer: Medicare Other | Admitting: Family Medicine

## 2021-03-01 ENCOUNTER — Encounter: Payer: Self-pay | Admitting: Family Medicine

## 2021-03-01 VITALS — BP 118/62 | HR 68 | Temp 98.0°F | Ht 70.0 in | Wt 260.2 lb

## 2021-03-01 DIAGNOSIS — E1143 Type 2 diabetes mellitus with diabetic autonomic (poly)neuropathy: Secondary | ICD-10-CM | POA: Diagnosis not present

## 2021-03-01 DIAGNOSIS — R0981 Nasal congestion: Secondary | ICD-10-CM

## 2021-03-01 DIAGNOSIS — E113559 Type 2 diabetes mellitus with stable proliferative diabetic retinopathy, unspecified eye: Secondary | ICD-10-CM

## 2021-03-01 DIAGNOSIS — E1142 Type 2 diabetes mellitus with diabetic polyneuropathy: Secondary | ICD-10-CM

## 2021-03-01 DIAGNOSIS — K3184 Gastroparesis: Secondary | ICD-10-CM

## 2021-03-01 DIAGNOSIS — K7581 Nonalcoholic steatohepatitis (NASH): Secondary | ICD-10-CM | POA: Diagnosis not present

## 2021-03-01 DIAGNOSIS — L03113 Cellulitis of right upper limb: Secondary | ICD-10-CM | POA: Diagnosis not present

## 2021-03-01 DIAGNOSIS — K7460 Unspecified cirrhosis of liver: Secondary | ICD-10-CM | POA: Diagnosis not present

## 2021-03-01 DIAGNOSIS — L989 Disorder of the skin and subcutaneous tissue, unspecified: Secondary | ICD-10-CM

## 2021-03-01 LAB — POCT GLYCOSYLATED HEMOGLOBIN (HGB A1C): Hemoglobin A1C: 5.7 % — AB (ref 4.0–5.6)

## 2021-03-01 MED ORDER — DOXYCYCLINE HYCLATE 100 MG PO TABS: 100.0000 mg | ORAL_TABLET | Freq: Two times a day (BID) | ORAL | 0 refills | Status: DC

## 2021-03-01 NOTE — Assessment & Plan Note (Signed)
Appreciate GI care. Pending liver MRI.  Elevated Fib4 score of 3.09 (07/2020).

## 2021-03-01 NOTE — Assessment & Plan Note (Signed)
Chronic, stable on ozempic - continue.  Overdue for eye exam - advised call and schedule

## 2021-03-01 NOTE — Patient Instructions (Addendum)
Use warm compresses to back of right hand. Take antibiotic course sent to pharmacy.  Schedule eye exam as you're due.  We will refer you to dermatologist.  Continue ozempic.  Return for physical.

## 2021-03-01 NOTE — Assessment & Plan Note (Signed)
Story/exam consistent with dorsal R hand cellulitis with swelling and pain, minimal erythema. Nothing to drain. rec warm compresses and doxycycline course to cover for MRSA. Update if not improving with treatment.

## 2021-03-01 NOTE — Assessment & Plan Note (Signed)
Discussed correct use of INS.  He will call and schedule ENT f/u as no improvement with conservative measures.

## 2021-03-01 NOTE — Progress Notes (Signed)
Patient ID: Chad [name], male    DOB: Nov 07, 1955, 66 y.o.   MRN: [id number]  This visit was conducted in person.  BP 118/62    Pulse 68    Temp 98 F (36.7 C) (Temporal)    Ht 5' 10"  (1.778 m)    Wt 260 lb 4 oz (118 kg)    SpO2 96%    BMI 37.34 kg/m    CC: DM f/u visit  Subjective:   HPI: Chad [name] is a 66 y.o. male presenting on 03/01/2021 for Diabetes (Here for 3 mo f/u.  Accompanied by wife, [name] Pulse. )   He is returning at end of the month for physical.  His mom passed away 3-4 months ago.   Hand swelling R>L over the past day. Started after he scraped hand on nail a few days ago s/p abrasion to dorsal R hand - treating with abx cream. Over 24 hours worsening pain and swelling noted.  Multiple scaly spots on skin of arms/hands - has not seen derm recently.   Seen by GI for NASH cirrhosis planning abdominal MRI. Also started on reglan 60m TID AC and HS given h/o gastroparesis. Was advised to hold miralax due to diarrhea. GI symptoms have largely resolved.   Nasal dryness despite nasal saline and flonase. He has been overusing flonase.   DM - does regularly check sugars fasting - 103 yesterday. Compliant with antihyperglycemic regimen which includes: ozempic 0.518mweekly. Last visit we trialed off ozempic due to ongoing GI discomfort, nausea, constipation - symptoms improved so he's restarted ozempic 0.5m42meekly. Weight gain noted (13 lbs since last visit). Denies low sugars or hypoglycemic symptoms. Denies paresthesias, blurry vision. Last diabetic eye exam 11/2019 - DUE. Glucometer accuchek. Last foot exam: 08/2020.  Lab Results  Component Value Date   HGBA1C 5.7 (A) 03/01/2021   Diabetic Foot Exam - Simple   No data filed    Lab Results  Component Value Date   MICROALBUR <0.7 03/12/2020         Relevant past medical, surgical, family and social history reviewed and updated as indicated. Interim medical history since our last visit reviewed. Allergies and  medications reviewed and updated. Outpatient Medications Prior to Visit  Medication Sig Dispense Refill   aspirin 81 MG chewable tablet Chew 81 mg by mouth daily.     atorvastatin (LIPITOR) 20 MG tablet TAKE 1 TABLET BY MOUTH ONCE DAILY 90 tablet 1   bumetanide (BUMEX) 2 MG tablet Take 2 mg by mouth daily. Additional  2 mg if gain 3 lbs     citalopram (CELEXA) 20 MG tablet TAKE 1 TABLET BY MOUTH ONCE DAILY 90 tablet 1   dapagliflozin propanediol (FARXIGA) 10 MG TABS tablet Take 10 mg by mouth daily before breakfast.     DEXILANT 60 MG capsule TAKE 1 CAPSULE BY MOUTH ONCE DAILY 30 capsule 8   diazepam (VALIUM) 5 MG tablet Take 1 tablet (5 mg total) by mouth every 12 (twelve) hours as needed for anxiety. 30 tablet 1   famotidine (PEPCID) 20 MG tablet TAKE 1 TABLET BY MOUTH EVERY NIGHT AT BEDTIME (Patient taking differently: Take 20 mg by mouth daily.) 90 tablet 1   ferrous sulfate 325 (65 FE) MG EC tablet Take 1 tablet (325 mg total) by mouth daily with breakfast.     fluticasone (FLONASE) 50 MCG/ACT nasal spray Place 2 sprays into both nostrils daily. 16 g 6   HYDROcodone-acetaminophen (NORCO/VICODIN) 5-325 MG tablet TAKE  1 TABLET BY MOUTH EVERY 6 HOURS AS NEEDED FOR MODERATE PAIN 15 tablet 0   isosorbide mononitrate (IMDUR) 60 MG 24 hr tablet TAKE 2 TABLETS BY MOUTH DAILY AND 1 TABLET AT BEDTIME 90 tablet 6   levothyroxine (SYNTHROID) 125 MCG tablet TAKE 1 TAB BY MOUTH ONCE DAILY. TAKE ON AN EMPTY STOMACH WITH A GLASS OF WATER ATLEAST 30-60 MINUTES BEFORE BREAKFAST 30 tablet 5   Magnesium 400 MG TABS Take 400 mg by mouth 2 (two) times daily.     methocarbamol (ROBAXIN) 500 MG tablet Take 1 tablet (500 mg total) by mouth 2 (two) times daily as needed for muscle spasms (sedation precautions). 20 tablet 0   metoCLOPramide (REGLAN) 5 MG tablet Take 1 tablet (5 mg total) by mouth 4 (four) times daily -  before meals and at bedtime. 120 tablet 1   metoprolol succinate (TOPROL-XL) 25 MG 24 hr tablet TAKE  1 TABLET BY MOUTH EVERY MORNING 30 tablet 5   Multiple Vitamin (MULITIVITAMIN WITH MINERALS) TABS Take 1 tablet by mouth daily.     nitroGLYCERIN (NITROSTAT) 0.4 MG SL tablet Place 1 tablet (0.4 mg total) under the tongue every 5 (five) minutes as needed for chest pain. 60 tablet 3   OZEMPIC, 0.25 OR 0.5 MG/DOSE, 2 MG/1.5ML SOPN INJECT 0.5 MG INTO THE SKIN ONCE A WEEK AS DIRECTED 1.5 mL 5   potassium chloride (KLOR-CON) 10 MEQ tablet Take 1 tablet (10 mEq total) by mouth daily. 90 tablet 3   spironolactone (ALDACTONE) 25 MG tablet Take 25 mg by mouth daily.     VENTOLIN HFA 108 (90 Base) MCG/ACT inhaler INHALE 2 PUFFS INTO THE LUNGS EVERY 6 HOURS AS NEEDED FOR WHEEZING OR SHORTNESS OF BREATH 18 g 5   vitamin B-12 (CYANOCOBALAMIN) 500 MCG tablet Take 1 tablet (500 mcg total) by mouth daily.     vitamin E 400 UNIT capsule Take 400 Units by mouth daily.     No facility-administered medications prior to visit.     Per HPI unless specifically indicated in ROS section below Review of Systems  Objective:  BP 118/62    Pulse 68    Temp 98 F (36.7 C) (Temporal)    Ht 5' 10"  (1.778 m)    Wt 260 lb 4 oz (118 kg)    SpO2 96%    BMI 37.34 kg/m   Wt Readings from Last 3 Encounters:  03/01/21 260 lb 4 oz (118 kg)  11/26/20 244 lb (110.7 kg)  11/25/20 247 lb (112 kg)      Physical Exam Vitals and nursing note reviewed.  Constitutional:      Appearance: Normal appearance. He is not ill-appearing.  Eyes:     Extraocular Movements: Extraocular movements intact.     Conjunctiva/sclera: Conjunctivae normal.     Pupils: Pupils are equal, round, and reactive to light.  Cardiovascular:     Rate and Rhythm: Normal rate and regular rhythm.     Pulses: Normal pulses.     Heart sounds: Normal heart sounds. No murmur heard. Pulmonary:     Effort: Pulmonary effort is normal. No respiratory distress.     Breath sounds: Normal breath sounds. No wheezing, rhonchi or rales.  Musculoskeletal:        General:  Swelling present.     Right lower leg: No edema.     Left lower leg: No edema.     Comments:  See HPI for foot exam if done 2+ rad pulses bilaterally Tender  edema to palpation dorsal R hand with central scab at site of prior abrasion  Skin:    General: Skin is warm and dry.     Findings: Erythema (mild to dorsal R hand) present. No rash.  Neurological:     Mental Status: He is alert.  Psychiatric:        Mood and Affect: Mood normal.        Behavior: Behavior normal.      Results for orders placed or performed in visit on 03/01/21  POCT glycosylated hemoglobin (Hb A1C)  Result Value Ref Range   Hemoglobin A1C 5.7 (A) 4.0 - 5.6 %   HbA1c POC (<> result, manual entry)     HbA1c, POC (prediabetic range)     HbA1c, POC (controlled diabetic range)      Assessment & Plan:  This visit occurred during the SARS-CoV-2 public health emergency.  Safety protocols were in place, including screening questions prior to the visit, additional usage of staff PPE, and extensive cleaning of exam room while observing appropriate contact time as indicated for disinfecting solutions.   Problem List Items Addressed This Visit     Controlled type 2 diabetes mellitus with retinopathy ([place]) - Primary    Chronic, stable on ozempic - continue.  Overdue for eye exam - advised call and schedule      Relevant Orders   POCT glycosylated hemoglobin (Hb A1C) (Completed)   Severe obesity (BMI 35.0-39.9) with comorbidity ([place])    Reviewed weight gain noted.       Diabetic gastroparesis ([place])    Now back on reglan 62m TID AC HS per GI      Liver cirrhosis secondary to NASH ([facility]    Appreciate GI care. Pending liver MRI.  Elevated Fib4 score of 3.09 (07/2020).       Diabetic neuropathy associated with type 2 diabetes mellitus ([place]   Relevant Orders   POCT glycosylated hemoglobin (Hb A1C) (Completed)   Skin lesions    Presumed AKs to bilateral hands and arms - will refer to derm.       Relevant  Orders   Ambulatory referral to Dermatology   Sinus congestion    Discussed correct use of INS.  He will call and schedule ENT f/u as no improvement with conservative measures.       Cellulitis of hand, right    Story/exam consistent with dorsal R hand cellulitis with swelling and pain, minimal erythema. Nothing to drain. rec warm compresses and doxycycline course to cover for MRSA. Update if not improving with treatment.         Meds ordered this encounter  Medications   doxycycline (VIBRA-TABS) 100 MG tablet    Sig: Take 1 tablet (100 mg total) by mouth 2 (two) times daily.    Dispense:  20 tablet    Refill:  0   Orders Placed This Encounter  Procedures   Ambulatory referral to Dermatology    Referral Priority:   Routine    Referral Type:   Consultation    Referral Reason:   Specialty Services Required    Requested Specialty:   Dermatology    Number of Visits Requested:   1   POCT glycosylated hemoglobin (Hb A1C)    Patient Instructions  Use warm compresses to back of right hand. Take antibiotic course sent to pharmacy.  Schedule eye exam as you're due.  We will refer you to dermatologist.  Continue ozempic.  Return for physical.   Follow up  plan: Return if symptoms worsen or fail to improve.  [name], MD

## 2021-03-01 NOTE — Assessment & Plan Note (Signed)
Reviewed weight gain noted.

## 2021-03-01 NOTE — Assessment & Plan Note (Signed)
Presumed AKs to bilateral hands and arms - will refer to derm.

## 2021-03-01 NOTE — Assessment & Plan Note (Signed)
Now back on reglan 60m TID AC HS per GI

## 2021-03-06 ENCOUNTER — Other Ambulatory Visit: Payer: Self-pay | Admitting: Family Medicine

## 2021-03-06 DIAGNOSIS — K7469 Other cirrhosis of liver: Secondary | ICD-10-CM

## 2021-03-06 DIAGNOSIS — R6889 Other general symptoms and signs: Secondary | ICD-10-CM

## 2021-03-06 DIAGNOSIS — K7581 Nonalcoholic steatohepatitis (NASH): Secondary | ICD-10-CM

## 2021-03-06 DIAGNOSIS — E611 Iron deficiency: Secondary | ICD-10-CM

## 2021-03-06 DIAGNOSIS — E113559 Type 2 diabetes mellitus with stable proliferative diabetic retinopathy, unspecified eye: Secondary | ICD-10-CM

## 2021-03-06 DIAGNOSIS — K911 Postgastric surgery syndromes: Secondary | ICD-10-CM

## 2021-03-06 DIAGNOSIS — K7460 Unspecified cirrhosis of liver: Secondary | ICD-10-CM

## 2021-03-06 DIAGNOSIS — E039 Hypothyroidism, unspecified: Secondary | ICD-10-CM

## 2021-03-06 DIAGNOSIS — Z9884 Bariatric surgery status: Secondary | ICD-10-CM

## 2021-03-06 DIAGNOSIS — E1169 Type 2 diabetes mellitus with other specified complication: Secondary | ICD-10-CM

## 2021-03-06 DIAGNOSIS — Z125 Encounter for screening for malignant neoplasm of prostate: Secondary | ICD-10-CM
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
## 2021-03-11 ENCOUNTER — Other Ambulatory Visit: Payer: Medicare Other

## 2021-03-12 ENCOUNTER — Ambulatory Visit (INDEPENDENT_AMBULATORY_CARE_PROVIDER_SITE_OTHER): Payer: Medicare Other

## 2021-03-12 VITALS — Ht 70.0 in | Wt 247.0 lb

## 2021-03-12 DIAGNOSIS — Z0000 Encounter for general adult medical examination without abnormal findings: Secondary | ICD-10-CM | POA: Diagnosis not present

## 2021-03-12 NOTE — Patient Instructions (Signed)
Chad [name] , Thank you for taking time to complete your Medicare Wellness Visit. I appreciate your ongoing commitment to your health goals. Please review the following plan we discussed and let me know if I can assist you in the future.   Screening recommendations/referrals: Colonoscopy: up to date, completed 02/17/20, due 02/16/25 Recommended yearly ophthalmology/optometry visit for glaucoma screening and checkup Recommended yearly dental visit for hygiene and checkup  Vaccinations: Influenza vaccine: up to date Pneumococcal vaccine: up to date Tdap vaccine: due, last completed 01/31/11, due 01/30/21, Discuss with pharmacy Shingles vaccine: up to date    Covid-19: up to date, please provide vaccine information at your next vist  Advanced directives: Please bring a copy of Living Will and/or [place] for your chart.   Conditions/risks identified: see problem list  Next appointment: Follow up in one year for your annual wellness visit. 03/14/22 @ 9:00am, this will be a telephone visit  Preventive Care 56 Years and Older, Male Preventive care refers to lifestyle choices and visits with your health care provider that can promote health and wellness. What does preventive care include? A yearly physical exam. This is also called an annual well check. Dental exams once or twice a year. Routine eye exams. Ask your health care provider how often you should have your eyes checked. Personal lifestyle choices, including: Daily care of your teeth and gums. Regular physical activity. Eating a healthy diet. Avoiding tobacco and drug use. Limiting alcohol use. Practicing safe sex. Taking low doses of aspirin every day. Taking vitamin and mineral supplements as recommended by your health care provider. What happens during an annual well check? The services and screenings done by your health care provider during your annual well check will depend on your age, overall health,  lifestyle risk factors, and family history of disease. Counseling  Your health care provider may ask you questions about your: Alcohol use. Tobacco use. Drug use. Emotional well-being. Home and relationship well-being. Sexual activity. Eating habits. History of falls. Memory and ability to understand (cognition). Work and work Statistician. Screening  You may have the following tests or measurements: Height, weight, and BMI. Blood pressure. Lipid and cholesterol levels. These may be checked every 5 years, or more frequently if you are over 45 years old. Skin check. Lung cancer screening. You may have this screening every year starting at age 78 if you have a 30-pack-year history of smoking and currently smoke or have quit within the past 15 years. Fecal occult blood test (FOBT) of the stool. You may have this test every year starting at age 57. Flexible sigmoidoscopy or colonoscopy. You may have a sigmoidoscopy every 5 years or a colonoscopy every 10 years starting at age 5. Prostate cancer screening. Recommendations will vary depending on your family history and other risks. Hepatitis C blood test. Hepatitis B blood test. Sexually transmitted disease (STD) testing. Diabetes screening. This is done by checking your blood sugar (glucose) after you have not eaten for a while (fasting). You may have this done every 1-3 years. Abdominal aortic aneurysm (AAA) screening. You may need this if you are a current or former smoker. Osteoporosis. You may be screened starting at age 48 if you are at high risk. Talk with your health care provider about your test results, treatment options, and if necessary, the need for more tests. Vaccines  Your health care provider may recommend certain vaccines, such as: Influenza vaccine. This is recommended every year. Tetanus, diphtheria, and acellular pertussis (Tdap,  Td) vaccine. You may need a Td booster every 10 years. Zoster vaccine. You may need this  after age 61. Pneumococcal 13-valent conjugate (PCV13) vaccine. One dose is recommended after age 97. Pneumococcal polysaccharide (PPSV23) vaccine. One dose is recommended after age 35. Talk to your health care provider about which screenings and vaccines you need and how often you need them. This information is not intended to replace advice given to you by your health care provider. Make sure you discuss any questions you have with your health care provider. Document Released: 03/06/2015 Document Revised: 10/28/2015 Document Reviewed: 12/09/2014 Elsevier Interactive Patient Education  2017 [place] Prevention in the Home Falls can cause injuries. They can happen to people of all ages. There are many things you can do to make your home safe and to help prevent falls. What can I do on the outside of my home? Regularly fix the edges of walkways and driveways and fix any cracks. Remove anything that might make you trip as you walk through a door, such as a raised step or threshold. Trim any bushes or trees on the path to your home. Use bright outdoor lighting. Clear any walking paths of anything that might make someone trip, such as rocks or tools. Regularly check to see if handrails are loose or broken. Make sure that both sides of any steps have handrails. Any raised decks and porches should have guardrails on the edges. Have any leaves, snow, or ice cleared regularly. Use sand or salt on walking paths during winter. Clean up any spills in your garage right away. This includes oil or grease spills. What can I do in the bathroom? Use night lights. Install grab bars by the toilet and in the tub and shower. Do not use towel bars as grab bars. Use non-skid mats or decals in the tub or shower. If you need to sit down in the shower, use a plastic, non-slip stool. Keep the floor dry. Clean up any water that spills on the floor as soon as it happens. Remove soap buildup in the tub or  shower regularly. Attach bath mats securely with double-sided non-slip rug tape. Do not have throw rugs and other things on the floor that can make you trip. What can I do in the bedroom? Use night lights. Make sure that you have a light by your bed that is easy to reach. Do not use any sheets or blankets that are too big for your bed. They should not hang down onto the floor. Have a firm chair that has side arms. You can use this for support while you get dressed. Do not have throw rugs and other things on the floor that can make you trip. What can I do in the kitchen? Clean up any spills right away. Avoid walking on wet floors. Keep items that you use a lot in easy-to-reach places. If you need to reach something above you, use a strong step stool that has a grab bar. Keep electrical cords out of the way. Do not use floor polish or wax that makes floors slippery. If you must use wax, use non-skid floor wax. Do not have throw rugs and other things on the floor that can make you trip. What can I do with my stairs? Do not leave any items on the stairs. Make sure that there are handrails on both sides of the stairs and use them. Fix handrails that are broken or loose. Make sure that handrails are as  long as the stairways. Check any carpeting to make sure that it is firmly attached to the stairs. Fix any carpet that is loose or worn. Avoid having throw rugs at the top or bottom of the stairs. If you do have throw rugs, attach them to the floor with carpet tape. Make sure that you have a light switch at the top of the stairs and the bottom of the stairs. If you do not have them, ask someone to add them for you. What else can I do to help prevent falls? Wear shoes that: Do not have high heels. Have rubber bottoms. Are comfortable and fit you well. Are closed at the toe. Do not wear sandals. If you use a stepladder: Make sure that it is fully opened. Do not climb a closed stepladder. Make  sure that both sides of the stepladder are locked into place. Ask someone to hold it for you, if possible. Clearly mark and make sure that you can see: Any grab bars or handrails. First and last steps. Where the edge of each step is. Use tools that help you move around (mobility aids) if they are needed. These include: Canes. Walkers. Scooters. Crutches. Turn on the lights when you go into a dark area. Replace any light bulbs as soon as they burn out. Set up your furniture so you have a clear path. Avoid moving your furniture around. If any of your floors are uneven, fix them. If there are any pets around you, be aware of where they are. Review your medicines with your doctor. Some medicines can make you feel dizzy. This can increase your chance of falling. Ask your doctor what other things that you can do to help prevent falls. This information is not intended to replace advice given to you by your health care provider. Make sure you discuss any questions you have with your health care provider. Document Released: 12/04/2008 Document Revised: 07/16/2015 Document Reviewed: 03/14/2014 Elsevier Interactive Patient Education  2017 [employer].

## 2021-03-16 ENCOUNTER — Encounter: Payer: Medicare Other | Admitting: Family Medicine

## 2021-03-30 ENCOUNTER — Other Ambulatory Visit: Payer: Self-pay | Admitting: Family Medicine

## 2021-03-31 DIAGNOSIS — E119 Type 2 diabetes mellitus without complications: Secondary | ICD-10-CM | POA: Diagnosis not present

## 2021-03-31 DIAGNOSIS — E039 Hypothyroidism, unspecified: Secondary | ICD-10-CM | POA: Diagnosis not present

## 2021-03-31 DIAGNOSIS — K7581 Nonalcoholic steatohepatitis (NASH): Secondary | ICD-10-CM | POA: Diagnosis not present

## 2021-03-31 DIAGNOSIS — Z794 Long term (current) use of insulin: Secondary | ICD-10-CM | POA: Diagnosis not present

## 2021-03-31 DIAGNOSIS — R079 Chest pain, unspecified: Secondary | ICD-10-CM | POA: Diagnosis not present

## 2021-03-31 DIAGNOSIS — I5032 Chronic diastolic (congestive) heart failure: Secondary | ICD-10-CM | POA: Diagnosis not present

## 2021-03-31 DIAGNOSIS — I110 Hypertensive heart disease with heart failure: Secondary | ICD-10-CM | POA: Diagnosis not present

## 2021-03-31 DIAGNOSIS — Z7982 Long term (current) use of aspirin: Secondary | ICD-10-CM | POA: Diagnosis not present

## 2021-03-31 DIAGNOSIS — Z79899 Other long term (current) drug therapy: Secondary | ICD-10-CM | POA: Diagnosis not present

## 2021-03-31 DIAGNOSIS — I2510 Atherosclerotic heart disease of native coronary artery without angina pectoris: Secondary | ICD-10-CM | POA: Diagnosis not present

## 2021-04-04 DIAGNOSIS — I5032 Chronic diastolic (congestive) heart failure: Secondary | ICD-10-CM | POA: Diagnosis not present

## 2021-04-06 ENCOUNTER — Other Ambulatory Visit: Payer: Self-pay | Admitting: Family Medicine

## 2021-04-13 ENCOUNTER — Other Ambulatory Visit: Payer: Self-pay | Admitting: Family Medicine

## 2021-04-13 NOTE — Telephone Encounter (Signed)
Refill request for HYDROCODONE-APAP 5-325 MG TAB  LOV - 03/01/21 Next OV - 06/25/21 Last refill - 04/29/20 #15/0

## 2021-04-13 NOTE — Telephone Encounter (Signed)
Name of Medication: Hydrocodone-APAP Name of Pharmacy: [facility] or [facility] Date and Quantity: 04/29/20, #15 Last Office Visit and Type: 03/01/21, 3 mo f/u Next Office Visit and Type: 06/25/21, AWV prt 2 Last Controlled Substance Agreement Date: 04/03/15 Last UDS: 04/03/15

## 2021-04-14 NOTE — Telephone Encounter (Signed)
ERx 

## 2021-05-03 ENCOUNTER — Other Ambulatory Visit: Payer: Self-pay | Admitting: Family Medicine

## 2021-05-04 ENCOUNTER — Other Ambulatory Visit: Payer: Self-pay | Admitting: Family Medicine

## 2021-05-11 ENCOUNTER — Other Ambulatory Visit: Payer: Self-pay | Admitting: Family Medicine

## 2021-05-11 ENCOUNTER — Other Ambulatory Visit: Payer: Self-pay | Admitting: Internal Medicine

## 2021-05-24 ENCOUNTER — Encounter: Payer: Self-pay | Admitting: Family

## 2021-05-24 ENCOUNTER — Telehealth: Payer: Self-pay

## 2021-05-24 ENCOUNTER — Ambulatory Visit (INDEPENDENT_AMBULATORY_CARE_PROVIDER_SITE_OTHER): Payer: Medicare Other | Admitting: Family

## 2021-05-24 ENCOUNTER — Ambulatory Visit (INDEPENDENT_AMBULATORY_CARE_PROVIDER_SITE_OTHER)
Admission: RE | Admit: 2021-05-24 | Discharge: 2021-05-24 | Disposition: A | Discharge status: Home / Self Care | Payer: Medicare Other | Source: Ambulatory Visit | Attending: Family | Admitting: Family

## 2021-05-24 VITALS — BP 116/58 | HR 76 | Temp 98.3°F | Resp 16 | Ht 70.0 in | Wt 255.0 lb

## 2021-05-24 DIAGNOSIS — R0781 Pleurodynia: Secondary | ICD-10-CM | POA: Diagnosis not present

## 2021-05-24 DIAGNOSIS — S299XXA Unspecified injury of thorax, initial encounter: Secondary | ICD-10-CM

## 2021-05-24 DIAGNOSIS — R059 Cough, unspecified: Secondary | ICD-10-CM | POA: Diagnosis not present

## 2021-05-24 DIAGNOSIS — I700 Atherosclerosis of aorta: Secondary | ICD-10-CM | POA: Diagnosis not present

## 2021-05-24 MED ORDER — HYDROCODONE-ACETAMINOPHEN 5-325 MG PO TABS: 1.0000 | ORAL_TABLET | Freq: Four times a day (QID) | ORAL | 0 refills | Status: AC | PRN

## 2021-05-24 NOTE — Patient Instructions (Signed)
Complete xray(s) prior to leaving today. I will notify you of your results once received. ? ?It was a pleasure seeing you today! Please do not hesitate to reach out with any questions and or concerns. ? ?Regards,  ? ?Syed Zukas ?FNP-C ? ? ?
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
## 2021-05-24 NOTE — Assessment & Plan Note (Signed)
Left rib xray and cxr ordered and pending today. ?rx norco vicodin 5-325 mg  ?pdmp reviewed ?Use pillow for sneezing, coughing, laughing ?Heat/ice alternating to site ?

## 2021-05-24 NOTE — Assessment & Plan Note (Signed)
Rib left xray , pending results ?R/o acute rib fracture ?
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
## 2021-05-24 NOTE — Telephone Encounter (Signed)
Left message to return call to our office.  

## 2021-05-25 ENCOUNTER — Telehealth: Payer: Self-pay
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
## 2021-05-26 ENCOUNTER — Telehealth: Payer: Self-pay

## 2021-05-26 DIAGNOSIS — K219 Gastro-esophageal reflux disease without esophagitis: Secondary | ICD-10-CM

## 2021-05-26 NOTE — Telephone Encounter (Signed)
Pa started on 05/25/2021 on CovermyMeds ?

## 2021-05-27 NOTE — Telephone Encounter (Signed)
PA Denied drug is not on preferred drug list ?

## 2021-05-31 NOTE — Telephone Encounter (Addendum)
He has h/o severe GERD with gastroparesis needs dexilant as he's tried and failed several other PPIs including omeprazole and nexium.  ?Can we resubmit with these indications?  ?

## 2021-05-31 NOTE — Telephone Encounter (Signed)
Plz notify pt - insurance is denying dexilant.  ?Has he been on pantoprazole in the past? ?

## 2021-06-01 ENCOUNTER — Other Ambulatory Visit: Payer: Self-pay | Admitting: Family Medicine

## 2021-06-01 DIAGNOSIS — Z794 Long term (current) use of insulin: Secondary | ICD-10-CM | POA: Diagnosis not present

## 2021-06-01 DIAGNOSIS — I10 Essential (primary) hypertension: Secondary | ICD-10-CM | POA: Diagnosis not present

## 2021-06-01 DIAGNOSIS — E113293 Type 2 diabetes mellitus with mild nonproliferative diabetic retinopathy without macular edema, bilateral: Secondary | ICD-10-CM | POA: Diagnosis not present

## 2021-06-01 DIAGNOSIS — E039 Hypothyroidism, unspecified: Secondary | ICD-10-CM | POA: Diagnosis not present

## 2021-06-01 DIAGNOSIS — I25118 Atherosclerotic heart disease of native coronary artery with other forms of angina pectoris: Secondary | ICD-10-CM | POA: Diagnosis not present

## 2021-06-01 NOTE — Telephone Encounter (Signed)
Patient wife called back. On dpr. Patient has taken pantoprazole in 2021 for about 2 months and it was not helpful at all. If dexilant is not covered can they get something else called in.  ?

## 2021-06-01 NOTE — Telephone Encounter (Signed)
Left a message on voicemail to call the office. ?

## 2021-06-02 NOTE — Telephone Encounter (Addendum)
Please update prior authorization request with this information - insurance should now cover dexilant.  ?He is already on pepcid.  ?

## 2021-06-07 NOTE — Telephone Encounter (Signed)
Spoke with Vivi Martens of [employer] prt B at [phone] to do appeal for Dexilant 60 mg cap (call ref #:  [id number]).  She wasn't able to help an connected me to [facility].  Per Aniceto Boss, this would need to go through pt's MCR prt D but she didn't have a phn #.  She recommends contacting pt to see what prt D drug plan he has and get the ID # and possible phn # from card. ?

## 2021-06-08 NOTE — Telephone Encounter (Addendum)
Spoke with pt asking if he has a rx card.  States he has a [employer] rx card and provided the physician pharmacy phn # [phone].  [employer] ID:  [id number]. Notified pt I'm working on the appeal for [employer].  Expresses his thanks.  ? ? ?Also, asked pt to let front office scan that card at next [place].  Pt verbalizes understanding.  ?

## 2021-06-08 NOTE — Telephone Encounter (Signed)
Spoke with Verdis Frederickson of [employer] to appeal PA.  She transferred me to appeals dept [[phone]].  Spoke with Carolyne Fiscal and provided additional PA info- pt has tried/failed omeprazole, nexium and pantoprazole.  Also, that pt continues daily on Dexilant + Pepcid with good effect, no breakthrough symptoms. ?  ?Carolyne Fiscal submitted appeal with new info [id number] #: [id number].  Says we should receive faxed decision.  She also provided phn # [phone] and fax # [phone] if we need to provide any other info.  ?

## 2021-06-09 NOTE — Telephone Encounter (Signed)
Received faxed PA approval, valid 02/21/2021- 02/20/2022.  Made pharmacy aware.  ?

## 2021-06-10 DIAGNOSIS — I25118 Atherosclerotic heart disease of native coronary artery with other forms of angina pectoris: Secondary | ICD-10-CM | POA: Diagnosis not present

## 2021-06-10 DIAGNOSIS — E669 Obesity, unspecified: Secondary | ICD-10-CM | POA: Diagnosis not present

## 2021-06-10 DIAGNOSIS — I10 Essential (primary) hypertension: Secondary | ICD-10-CM | POA: Diagnosis not present

## 2021-06-10 DIAGNOSIS — E785 Hyperlipidemia, unspecified: Secondary | ICD-10-CM | POA: Diagnosis not present

## 2021-06-10 DIAGNOSIS — E119 Type 2 diabetes mellitus without complications: Secondary | ICD-10-CM | POA: Diagnosis not present

## 2021-06-15 ENCOUNTER — Other Ambulatory Visit: Payer: Self-pay | Admitting: Family Medicine

## 2021-06-18 ENCOUNTER — Other Ambulatory Visit (INDEPENDENT_AMBULATORY_CARE_PROVIDER_SITE_OTHER): Payer: Medicare Other

## 2021-06-18 DIAGNOSIS — K911 Postgastric surgery syndromes: Secondary | ICD-10-CM

## 2021-06-18 DIAGNOSIS — E785 Hyperlipidemia, unspecified: Secondary | ICD-10-CM | POA: Diagnosis not present

## 2021-06-18 DIAGNOSIS — Z125 Encounter for screening for malignant neoplasm of prostate: Secondary | ICD-10-CM

## 2021-06-18 DIAGNOSIS — K7581 Nonalcoholic steatohepatitis (NASH): Secondary | ICD-10-CM | POA: Diagnosis not present

## 2021-06-18 DIAGNOSIS — E039 Hypothyroidism, unspecified: Secondary | ICD-10-CM | POA: Diagnosis not present

## 2021-06-18 DIAGNOSIS — K7460 Unspecified cirrhosis of liver: Secondary | ICD-10-CM

## 2021-06-18 DIAGNOSIS — Z9884 Bariatric surgery status: Secondary | ICD-10-CM

## 2021-06-18 DIAGNOSIS — E1169 Type 2 diabetes mellitus with other specified complication: Secondary | ICD-10-CM | POA: Diagnosis not present

## 2021-06-18 DIAGNOSIS — E113559 Type 2 diabetes mellitus with stable proliferative diabetic retinopathy, unspecified eye: Secondary | ICD-10-CM

## 2021-06-18 DIAGNOSIS — E611 Iron deficiency: Secondary | ICD-10-CM | POA: Diagnosis not present

## 2021-06-18 DIAGNOSIS — R6889 Other general symptoms and signs: Secondary | ICD-10-CM | POA: Diagnosis not present

## 2021-06-18 DIAGNOSIS — K7469 Other cirrhosis of liver: Secondary | ICD-10-CM

## 2021-06-18 LAB — TSH: TSH: 3.71 u[IU]/mL (ref 0.35–5.50)

## 2021-06-18 LAB — CBC WITH DIFFERENTIAL/PLATELET
Basophils Absolute: 0 10*3/uL (ref 0.0–0.1)
Basophils Relative: 0.6 % (ref 0.0–3.0)
Eosinophils Absolute: 0.1 10*3/uL (ref 0.0–0.7)
Eosinophils Relative: 1.5 % (ref 0.0–5.0)
HCT: 45.5 % (ref 39.0–52.0)
Hemoglobin: 15.4 g/dL (ref 13.0–17.0)
Lymphocytes Relative: 14.8 % (ref 12.0–46.0)
Lymphs Abs: 0.7 10*3/uL (ref 0.7–4.0)
MCHC: 33.9 g/dL (ref 30.0–36.0)
MCV: 88.1 fl (ref 78.0–100.0)
Monocytes Absolute: 0.4 10*3/uL (ref 0.1–1.0)
Monocytes Relative: 8.9 % (ref 3.0–12.0)
Neutro Abs: 3.5 10*3/uL (ref 1.4–7.7)
Neutrophils Relative %: 74.2 % (ref 43.0–77.0)
Platelets: 121 10*3/uL — ABNORMAL LOW (ref 150.0–400.0)
RBC: 5.17 Mil/uL (ref 4.22–5.81)
RDW: 13.5 % (ref 11.5–15.5)
WBC: 4.7 10*3/uL (ref 4.0–10.5)

## 2021-06-18 LAB — COMPREHENSIVE METABOLIC PANEL
ALT: 41 U/L (ref 0–53)
AST: 40 U/L — ABNORMAL HIGH (ref 0–37)
Albumin: 4.3 g/dL (ref 3.5–5.2)
Alkaline Phosphatase: 148 U/L — ABNORMAL HIGH (ref 39–117)
BUN: 15 mg/dL (ref 6–23)
CO2: 32 mEq/L (ref 19–32)
Calcium: 9 mg/dL (ref 8.4–10.5)
Chloride: 99 mEq/L (ref 96–112)
Creatinine, Ser: 0.98 mg/dL (ref 0.40–1.50)
GFR: 80.46 mL/min (ref >60.00)
Glucose, Bld: 136 mg/dL — ABNORMAL HIGH (ref 70–99)
Potassium: 3.2 mEq/L — ABNORMAL LOW (ref 3.5–5.1)
Sodium: 140 mEq/L (ref 135–145)
Total Bilirubin: 1.3 mg/dL — ABNORMAL HIGH (ref 0.2–1.2)
Total Protein: 6.2 g/dL (ref 6.0–8.3)

## 2021-06-18 LAB — FERRITIN: Ferritin: 100.1 ng/mL (ref 22.0–322.0)

## 2021-06-18 LAB — PROTIME-INR
INR: 1.1 ratio — ABNORMAL HIGH (ref 0.8–1.0)
Prothrombin Time: 11.7 s (ref 9.6–13.1)

## 2021-06-18 LAB — LIPID PANEL
Cholesterol: 120 mg/dL (ref 0–200)
HDL: 46.8 mg/dL (ref >39.00)
LDL Cholesterol: 52 mg/dL (ref 0–99)
NonHDL: 73.16
Total CHOL/HDL Ratio: 3
Triglycerides: 105 mg/dL (ref 0.0–149.0)
VLDL: 21 mg/dL (ref 0.0–40.0)

## 2021-06-18 LAB — MICROALBUMIN / CREATININE URINE RATIO
Creatinine,U: 130.4 mg/dL
Microalb Creat Ratio: 0.5 mg/g (ref 0.0–30.0)
Microalb, Ur: <0.7 mg/dL (ref 0.0–1.9)

## 2021-06-18 LAB — FOLATE: Folate: >23.5 ng/mL (ref >5.9)

## 2021-06-18 LAB — IBC PANEL
Iron: 102 ug/dL (ref 42–165)
Saturation Ratios: 35.7 % (ref 20.0–50.0)
TIBC: 285.6 ug/dL (ref 250.0–450.0)
Transferrin: 204 mg/dL — ABNORMAL LOW (ref 212.0–360.0)

## 2021-06-18 LAB — VITAMIN D 25 HYDROXY (VIT D DEFICIENCY, FRACTURES): VITD: 34 ng/mL (ref 30.00–100.00)

## 2021-06-18 LAB — VITAMIN B12: Vitamin B-12: 781 pg/mL (ref 211–911)

## 2021-06-18 LAB — PSA, MEDICARE: PSA: 1.11 ng/ml (ref 0.10–4.00)

## 2021-06-23 LAB — VITAMIN B1: Vitamin B1 (Thiamine): 13 nmol/L (ref 8–30)

## 2021-06-25 ENCOUNTER — Encounter: Payer: Self-pay | Admitting: Family Medicine

## 2021-06-25 ENCOUNTER — Ambulatory Visit (INDEPENDENT_AMBULATORY_CARE_PROVIDER_SITE_OTHER): Payer: Medicare Other | Admitting: Family Medicine

## 2021-06-25 VITALS — BP 124/62 | HR 82 | Temp 97.7°F | Ht 70.0 in | Wt 267.0 lb

## 2021-06-25 DIAGNOSIS — R748 Abnormal levels of other serum enzymes: Secondary | ICD-10-CM

## 2021-06-25 DIAGNOSIS — K3184 Gastroparesis: Secondary | ICD-10-CM

## 2021-06-25 DIAGNOSIS — E785 Hyperlipidemia, unspecified: Secondary | ICD-10-CM

## 2021-06-25 DIAGNOSIS — I6523 Occlusion and stenosis of bilateral carotid arteries: Secondary | ICD-10-CM | POA: Diagnosis not present

## 2021-06-25 DIAGNOSIS — E1169 Type 2 diabetes mellitus with other specified complication: Secondary | ICD-10-CM | POA: Diagnosis not present

## 2021-06-25 DIAGNOSIS — K7469 Other cirrhosis of liver: Secondary | ICD-10-CM

## 2021-06-25 DIAGNOSIS — D696 Thrombocytopenia, unspecified: Secondary | ICD-10-CM

## 2021-06-25 DIAGNOSIS — E1143 Type 2 diabetes mellitus with diabetic autonomic (poly)neuropathy: Secondary | ICD-10-CM

## 2021-06-25 DIAGNOSIS — Z7189 Other specified counseling: Secondary | ICD-10-CM

## 2021-06-25 DIAGNOSIS — K7460 Unspecified cirrhosis of liver: Secondary | ICD-10-CM

## 2021-06-25 DIAGNOSIS — I5032 Chronic diastolic (congestive) heart failure: Secondary | ICD-10-CM | POA: Diagnosis not present

## 2021-06-25 DIAGNOSIS — K219 Gastro-esophageal reflux disease without esophagitis: Secondary | ICD-10-CM | POA: Diagnosis not present

## 2021-06-25 DIAGNOSIS — K7581 Nonalcoholic steatohepatitis (NASH): Secondary | ICD-10-CM

## 2021-06-25 DIAGNOSIS — E611 Iron deficiency: Secondary | ICD-10-CM

## 2021-06-25 DIAGNOSIS — E039 Hypothyroidism, unspecified: Secondary | ICD-10-CM | POA: Diagnosis not present

## 2021-06-25 DIAGNOSIS — Z9884 Bariatric surgery status: Secondary | ICD-10-CM | POA: Diagnosis not present

## 2021-06-25 DIAGNOSIS — I2510 Atherosclerotic heart disease of native coronary artery without angina pectoris: Secondary | ICD-10-CM

## 2021-06-25 DIAGNOSIS — I700 Atherosclerosis of aorta: Secondary | ICD-10-CM

## 2021-06-25 DIAGNOSIS — I10 Essential (primary) hypertension: Secondary | ICD-10-CM | POA: Diagnosis not present

## 2021-06-25 DIAGNOSIS — E113559 Type 2 diabetes mellitus with stable proliferative diabetic retinopathy, unspecified eye: Secondary | ICD-10-CM | POA: Diagnosis not present

## 2021-06-25 DIAGNOSIS — E1142 Type 2 diabetes mellitus with diabetic polyneuropathy: Secondary | ICD-10-CM

## 2021-06-25 DIAGNOSIS — H34812 Central retinal vein occlusion, left eye, with macular edema: Secondary | ICD-10-CM

## 2021-06-25 MED ORDER — TIRZEPATIDE 2.5 MG/0.5ML ~~LOC~~ SOAJ: 2.5000 mg | SUBCUTANEOUS | 0 refills | Status: DC

## 2021-06-25 MED ORDER — POTASSIUM CHLORIDE CRYS ER 10 MEQ PO TBCR: 10.0000 meq | EXTENDED_RELEASE_TABLET | Freq: Two times a day (BID) | ORAL | 3 refills | Status: DC

## 2021-06-25 NOTE — Assessment & Plan Note (Signed)
Discussed advanced directives, doesn't think would want prolonged life support, but will think about this. Would want wife to be HCPOA. Packet previously provided and again today. Would want ventilation support only if he gets coronavirus infection.  ?

## 2021-06-25 NOTE — Progress Notes (Signed)
? ? Patient ID: Chad [name], male    DOB: 19-Sep-1955, 66 y.o.   MRN: [id number] ? ?This visit was conducted in person. ? ?BP 124/62   Pulse 82   Temp 97.7 ?F (36.5 ?C) (Temporal)   Ht 5' 10"  (1.778 m)   Wt 267 lb (121.1 kg)   SpO2 96%   BMI 38.31 kg/m?   ? ?CC: AMW f/u visit ?Subjective:  ? ?HPI: ?Chad [name] is a 66 y.o. male presenting on 06/25/2021 for Annual Exam [facility] prt 2.  Pt accompanied by wife, [name] Pulse.  ) ? ? ?Saw health advisor 02/2021 for medicare wellness visit. Note reviewed.  Cognitive assessment not performed.  ? ?No results found.  ?Flowsheet Row Clinical Support from 03/12/2021 in [place] [facility] [place]  ?PHQ-2 Total Score 0  ? ?  ?  ? ?  03/12/2021  ?  9:05 AM 03/11/2020  ?  9:02 AM 11/21/2018  ? 10:34 AM 11/16/2017  ?  9:49 AM 05/15/2017  ?  2:25 PM  ?Fall Risk   ?Falls in the past year? 0 1 1 Yes No  ?Comment    "passed out" in yard   ?Number falls in past yr: 0 0 1 1   ?Injury with Fall? 0 1 1 No   ?Comment  broke ribs neck injury    ?Risk for fall due to : No Fall Risks Medication side effect History of fall(s);Impaired balance/gait;Impaired mobility;Medication side effect    ?Follow up Falls prevention discussed Falls evaluation completed;Falls prevention discussed Falls evaluation completed;Falls prevention discussed    ? ?He saw cardiology and had stress test done last month - stable period. He continues bumex 6m BID and metolazone 2.569mqd.  ? ?20 lb weight gain in the past 4 months.  ? ?DM - notes decreased effectiveness of ozempic 0.55m455meekly. Interested in higher dose.  ? ?Preventative: ?COLONOSCOPY Date: 12/2014 TAs, mod diverticulosis, rpt 3 yrs (Pyrtle) ?COLONOSCOPY 08/2015 - HP, diverticulosis (Pyrtle) ?COLONOSCOPY WITH PROPOFOL 02/17/2020 - inflammatory polyp, rpt 5 yrs (Pyrtle, JayLajuan LinesD) ?ESOPHAGOGASTRODUODENOSCOPY (EGD) WITH PROPOFOL 02/17/2020 - chronic gastritis, neg H pylori (Pyrtle, JayLajuan LinesD) ?Prostate cancer screen yearly  ?Lung cancer screening - not  eligible ?Flu shot yearly  ?COVDewey2021, 04/2019, booster 01/2020 ?Td - 01/2011  ?Pneumovax 2008, 2013  ?Prevnar-13 02/2020 ?Shingrix - 11/2018, 06/2019 ?Discussed advanced directives, doesn't think would want prolonged life support, but will think about this. Would want wife to be HCPOA. Packet previously provided and again today. Would want ventilation support only if he gets coronavirus infection.  ?Seat belt use discussed ?Sunscreen use discussed. Just saw dermatology.  ?Non smoker ?Alcohol - none ?Dentist - yearly ?Eye exam yearly - 2021. L vision loss due to CRVO ?Bowel - no constipation  ?Bladder - no incontinence ?  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 Avoiding fried foods. ?   ? ?Relevant past medical, surgical, family and social history reviewed and updated as indicated. Interim medical history since our last visit reviewed. ?Allergies and medications reviewed and updated. ?Outpatient Medications Prior to Visit  ?Medication Sig Dispense Refill  ? aspirin 81 MG chewable tablet Chew 81 mg by mouth daily.    ? atorvastatin (LIPITOR) 20 MG tablet TAKE 1 TABLET BY MOUTH ONCE DAILY 90 tablet  1  ? bumetanide (BUMEX) 2 MG tablet Take 2 mg by mouth daily. Additional  2 mg if gain 3 lbs    ? citalopram (CELEXA) 20 MG tablet TAKE 1 TABLET BY MOUTH ONCE DAILY 90 tablet 1  ? dapagliflozin propanediol (FARXIGA) 10 MG TABS tablet Take 10 mg by mouth daily before breakfast.    ? DEXILANT 60 MG capsule TAKE 1 CAPSULE BY MOUTH ONCE DAILY 30 capsule 8  ? diazepam (VALIUM) 5 MG tablet Take 1 tablet (5 mg total) by mouth every 12 (twelve) hours as needed for anxiety. 30 tablet 1  ? doxycycline (VIBRA-TABS) 100 MG tablet Take 1 tablet (100 mg total) by mouth 2 (two) times daily. 20 tablet 0  ? famotidine (PEPCID) 20 MG  tablet TAKE 1 TABLET BY MOUTH EVERY NIGHT AT BEDTIME (Patient taking differently: Take 20 mg by mouth daily.) 90 tablet 1  ? ferrous sulfate 325 (65 FE) MG EC tablet Take 1 tablet (325 mg total) by mouth daily with breakfast.    ? fluticasone (FLONASE) 50 MCG/ACT nasal spray Place 2 sprays into both nostrils daily. 16 g 6  ? isosorbide mononitrate (IMDUR) 60 MG 24 hr tablet TAKE 2 TABLETS BY MOUTH DAILY AND 1 TABLET AT BEDTIME 90 tablet 1  ? levothyroxine (SYNTHROID) 125 MCG tablet TAKE 1 TAB BY MOUTH ONCE DAILY. TAKE ON AN EMPTY STOMACH WITH A GLASS OF WATER ATLEAST 30-60 MINUTES BEFORE BREAKFAST 30 tablet 0  ? Magnesium 400 MG TABS Take 400 mg by mouth 2 (two) times daily.    ? methocarbamol (ROBAXIN) 500 MG tablet Take 1 tablet (500 mg total) by mouth 2 (two) times daily as needed for muscle spasms (sedation precautions). 20 tablet 0  ? metoCLOPramide (REGLAN) 5 MG tablet TAKE 1 TABLET BY MOUTH 3 TIMES DAILY BEFORE MEALS 120 tablet 1  ? metolazone (ZAROXOLYN) 2.5 MG tablet Take 1 tablet (2.5 mg total) by mouth once a week. As needed for fluid/swelling    ? metoprolol succinate (TOPROL-XL) 25 MG 24 hr tablet TAKE 1 TABLET BY MOUTH EVERY MORNING 30 tablet 5  ? Multiple Vitamin (MULITIVITAMIN WITH MINERALS) TABS Take 1 tablet by mouth daily.    ? nitroGLYCERIN (NITROSTAT) 0.4 MG SL tablet Place 1 tablet (0.4 mg total) under the tongue every 5 (five) minutes as needed for chest pain. 60 tablet 3  ? OZEMPIC, 0.25 OR 0.5 MG/DOSE, 2 MG/1.5ML SOPN INJECT 0.5 MG INTO THE SKIN ONCE A WEEK AS DIRECTED 1.5 mL 5  ? spironolactone (ALDACTONE) 25 MG tablet Take 25 mg by mouth daily.    ? VENTOLIN HFA 108 (90 Base) MCG/ACT inhaler INHALE 2 PUFFS INTO THE LUNGS EVERY 6 HOURS AS NEEDED FOR WHEEZING OR SHORTNESS OF BREATH 18 g 5  ? vitamin B-12 (CYANOCOBALAMIN) 500 MCG tablet Take 1 tablet (500 mcg total) by mouth daily.    ? vitamin E 400 UNIT capsule Take 400 Units by mouth daily.    ? potassium chloride (KLOR-CON) 10 MEQ tablet  Take 1 tablet (10 mEq total) by mouth daily. 90 tablet 3  ? ?No facility-administered medications prior to visit.  ?  ? ?Per HPI unless specifically indicated in ROS section below ?Review of Systems ? ?Objective:  ?BP 124/62   Pulse 82   Temp 97.7 ?F (36.5 ?C) (Temporal)   Ht 5' 10"  (1.778 m)   Wt 267 lb (121.1 kg)   SpO2 96%   BMI 38.31 kg/m?   ?Wt Readings from Last 3 Encounters:  ?  06/25/21 267 lb (121.1 kg)  ?05/24/21 255 lb (115.7 kg)  ?03/12/21 247 lb (112 kg)  ?  ?  ?Physical Exam ?Vitals and nursing note reviewed.  ?Constitutional:   ?   General: He is not in acute distress. ?   Appearance: Normal appearance. He is well-developed. He is not ill-appearing.  ?HENT:  ?   Head: Normocephalic and atraumatic.  ?   Right Ear: Hearing, tympanic membrane, ear canal and external ear normal.  ?   Left Ear: Hearing, tympanic membrane, ear canal and external ear normal.  ?Eyes:  ?   General: No scleral icterus. ?   Extraocular Movements: Extraocular movements intact.  ?   Conjunctiva/sclera: Conjunctivae normal.  ?   Pupils: Pupils are equal, round, and reactive to light.  ?Neck:  ?   Thyroid: No thyroid mass or thyromegaly.  ?   Vascular: Carotid bruit (mild R sided) present.  ?Cardiovascular:  ?   Rate and Rhythm: Normal rate and regular rhythm.  ?   Pulses: Normal pulses.     ?     Radial pulses are 2+ on the right side and 2+ on the left side.  ?   Heart sounds: Normal heart sounds. No murmur heard. ?Pulmonary:  ?   Effort: Pulmonary effort is normal. No respiratory distress.  ?   Breath sounds: Normal breath sounds. No wheezing, rhonchi or rales.  ?Abdominal:  ?   General: Bowel sounds are normal. There is no distension.  ?   Palpations: Abdomen is soft. There is no hepatomegaly, splenomegaly or mass.  ?   Tenderness: There is no abdominal tenderness. There is no guarding or rebound.  ?   Hernia: No hernia is present.  ?Musculoskeletal:     ?   General: Normal range of motion.  ?   Cervical back: Normal range  of motion and neck supple.  ?   Right lower leg: No edema.  ?   Left lower leg: No edema.  ?Lymphadenopathy:  ?   Cervical: No cervical adenopathy.  ?Skin: ?   General: Skin is warm and dry.  ?   Findings: No rash.  ?Neurologic

## 2021-06-25 NOTE — Patient Instructions (Addendum)
Call and reschedule eye exam ([place]).   ?Bring [place] a copy of your living will/advanced directive (packet provided today). ?Potassium was low - increase to one 23mq potassium tablet twice daily.  ?Liver blood tests remain high - you will be due for GI follow up for this in October 2023.  ?Let's transition to mounjaro - start 0.249mweekly for 1 month then increase to 0.40m33meekly.  ?We will order carotid ultrasound for neck artery evaluation.  ?Return as needed or in 4 months for follow up visit.  ? ?Health Maintenance After Age 50 34fter age 73,40ou are at a higher risk for certain long-term diseases and infections as well as injuries from falls. Falls are a major cause of broken bones and head injuries in people who are older than age 73.26etting regular preventive care can help to keep you healthy and well. Preventive care includes getting regular testing and making lifestyle changes as recommended by your health care provider. Talk with your health care provider about: ?Which screenings and tests you should have. A screening is a test that checks for a disease when you have no symptoms. ?A diet and exercise plan that is right for you. ?What should I know about screenings and tests to prevent falls? ?Screening and testing are the best ways to find a health problem early. Early diagnosis and treatment give you the best chance of managing medical conditions that are common after age 73.66ertain conditions and lifestyle choices may make you more likely to have a fall. Your health care provider may recommend: ?Regular vision checks. Poor vision and conditions such as cataracts can make you more likely to have a fall. If you wear glasses, make sure to get your prescription updated if your vision changes. ?Medicine review. Work with your health care provider to regularly review all of the medicines you are taking, including over-the-counter medicines. Ask your health care provider about any side effects that may  make you more likely to have a fall. Tell your health care provider if any medicines that you take make you feel dizzy or sleepy. ?Strength and balance checks. Your health care provider may recommend certain tests to check your strength and balance while standing, walking, or changing positions. ?Foot health exam. Foot pain and numbness, as well as not wearing proper footwear, can make you more likely to have a fall. ?Screenings, including: ?Osteoporosis screening. Osteoporosis is a condition that causes the bones to get weaker and break more easily. ?Blood pressure screening. Blood pressure changes and medicines to control blood pressure can make you feel dizzy. ?Depression screening. You may be more likely to have a fall if you have a fear of falling, feel depressed, or feel unable to do activities that you used to do. ?Alcohol use screening. Using too much alcohol can affect your balance and may make you more likely to have a fall. ?Follow these instructions at home: ?Lifestyle ?Do not drink alcohol if: ?Your health care provider tells you not to drink. ?If you drink alcohol: ?Limit how much you have to: ?0-1 drink a day for women. ?0-2 drinks a day for men. ?Know how much alcohol is in your drink. In the [place] one drink equals one 12 oz bottle of beer (355 mL), one 5 oz glass of wine (148 mL), or one 1? oz glass of hard liquor (44 mL). ?Do not use any products that contain nicotine or tobacco. These products include cigarettes, chewing tobacco, and vaping devices, such as e-cigarettes.  If you need help quitting, ask your health care provider. ?Activity ? ?Follow a regular exercise program to stay fit. This will help you maintain your balance. Ask your health care provider what types of exercise are appropriate for you. ?If you need a cane or walker, use it as recommended by your health care provider. ?Wear supportive shoes that have nonskid soles. ?Safety ? ?Remove any tripping hazards, such as rugs, cords, and  clutter. ?Install safety equipment such as grab bars in bathrooms and safety rails on stairs. ?Keep rooms and walkways well-lit. ?General instructions ?Talk with your health care provider about your risks for falling. Tell your health care provider if: ?You fall. Be sure to tell your health care provider about all falls, even ones that seem minor. ?You feel dizzy, tiredness (fatigue), or off-balance. ?Take over-the-counter and prescription medicines only as told by your health care provider. These include supplements. ?Eat a healthy diet and maintain a healthy weight. A healthy diet includes low-fat dairy products, low-fat (lean) meats, and fiber from whole grains, beans, and lots of fruits and vegetables. ?Stay current with your vaccines. ?Schedule regular health, dental, and eye exams. ?Summary ?Having a healthy lifestyle and getting preventive care can help to protect your health and wellness after age 64. ?Screening and testing are the best way to find a health problem early and help you avoid having a fall. Early diagnosis and treatment give you the best chance for managing medical conditions that are more common for people who are older than age 52. ?Falls are a major cause of broken bones and head injuries in people who are older than age 73. Take precautions to prevent a fall at home. ?Work with your health care provider to learn what changes you can make to improve your health and wellness and to prevent falls. ?This information is not intended to replace advice given to you by your health care provider. Make sure you discuss any questions you have with your health care provider. ?Document Revised: 06/29/2020 Document Reviewed: 06/29/2020 ?Elsevier Patient Education ? [place]. ? ?

## 2021-06-26 NOTE — Assessment & Plan Note (Addendum)
Appreciate cardiology care - recent stress test stable.  ?

## 2021-06-26 NOTE — Assessment & Plan Note (Signed)
Stable period on daily dexilant and pepcid.  ?Other PPIs tried with poor effect (nexium, omeprazole, pantoprazole) ?

## 2021-06-26 NOTE — Assessment & Plan Note (Signed)
Stable period on lipitor 81m daily - continue. ?The ASCVD Risk score (Arnett DK, et al., 2019) failed to calculate for the following reasons: ?  The patient has a prior MI or stroke diagnosis  ?

## 2021-06-26 NOTE — Assessment & Plan Note (Signed)
Continue aspirin, statin.  

## 2021-06-26 NOTE — Assessment & Plan Note (Signed)
Update carotid US with R bruit heard.  ?

## 2021-06-26 NOTE — Assessment & Plan Note (Signed)
Discussed noted weight gain - will try out mounjaro slow titration.  ?

## 2021-06-26 NOTE — Assessment & Plan Note (Addendum)
Chronic, tolerates 6m ozempic well along with full dose farxiga. However notes decreased benefit on appetite control, weight gain noted. Interested in trial of mounjaro which his wife takes with success. Will stop ozemipc and start mounjaro 2.563mweekly x 1 month with slow titration as tolerated. RTC 4 mo DM f/u visit  ?

## 2021-06-26 NOTE — Assessment & Plan Note (Signed)
Continue reglan 93m TID AC ?

## 2021-06-26 NOTE — Assessment & Plan Note (Signed)
Chronic, stable period on levothyroxine.  ?

## 2021-06-26 NOTE — Assessment & Plan Note (Addendum)
Thought cirrhosis related. Latest [place] reassuring. Recent liver MRI also reassuring (11/2020) ?

## 2021-06-26 NOTE — Assessment & Plan Note (Signed)
Iron levels stable on ferrous sulfate daily replacement.  ?

## 2021-06-26 NOTE — Assessment & Plan Note (Signed)
Discussed. Continue regular GI f/u.  ?MELD-Na score: 8 at 06/18/2021  8:27 AM ?MELD score: 8 at 06/18/2021  8:27 AM ?Calculated from: ?Serum Creatinine: 0.98 mg/dL (Using min of 1 mg/dL) at 06/18/2021  8:27 AM ?Serum Sodium: 140 mEq/L (Using max of 137 mEq/L) at 06/18/2021  8:27 AM ?Total Bilirubin: 1.3 mg/dL at 06/18/2021  8:27 AM ?INR(ratio): 1.1 ratio at 06/18/2021  8:27 AM ?Age: 66 years  ?

## 2021-06-26 NOTE — Assessment & Plan Note (Signed)
Chronic, stable on current regimen. Hypotension has previously limited medication titration.  ?

## 2021-06-26 NOTE — Assessment & Plan Note (Signed)
Liver related. Continue to monitor.  ?

## 2021-06-26 NOTE — Assessment & Plan Note (Addendum)
Continues bumex BID with metolazone 2.41m weekly.  ?Recent potassium level low - rec increase this to 125m BID. Seems euvolemic today.  ?

## 2021-07-02 ENCOUNTER — Telehealth: Payer: Self-pay

## 2021-07-02 NOTE — Telephone Encounter (Signed)
Request received not started yet.  ? ? ?

## 2021-07-02 NOTE — Telephone Encounter (Signed)
PA approved.  (See 05/26/21 phn note) ?

## 2021-07-14 ENCOUNTER — Other Ambulatory Visit: Payer: Self-pay | Admitting: Family Medicine

## 2021-07-15 NOTE — Telephone Encounter (Signed)
Imdur last filled:  06/15/21, #90 Mounjaro last filled:  06/25/21, #5 mL Last OV:  06/25/21, CPE Next OV:  none

## 2021-07-20 NOTE — Telephone Encounter (Signed)
Isosorbide refilled. plz check with pt to ensure tolerating mounjaro well, and if so will increase to next dose of 0.24m weekly.

## 2021-07-21 NOTE — Telephone Encounter (Signed)
Lvm asking pt to call back.  Need to relay Dr. G's message.  

## 2021-07-22 ENCOUNTER — Ambulatory Visit (INDEPENDENT_AMBULATORY_CARE_PROVIDER_SITE_OTHER): Payer: Medicare Other

## 2021-07-22 DIAGNOSIS — I6523 Occlusion and stenosis of bilateral carotid arteries: Secondary | ICD-10-CM

## 2021-07-22 NOTE — Telephone Encounter (Signed)
77m dose sent to pharmacy

## 2021-07-22 NOTE — Telephone Encounter (Signed)
Spoke with pt notifying him isosorbide was refilled.  Also, asked how pt is tolerating Mounjaro.  States he's doing good and is ready for a refill.  I informed him, Dr. Darnell Level will increase dose to 0.5 mg wkly.  Pt verbalizes understanding.

## 2021-07-27 ENCOUNTER — Other Ambulatory Visit: Payer: Self-pay | Admitting: Family Medicine

## 2021-08-02 ENCOUNTER — Ambulatory Visit (INDEPENDENT_AMBULATORY_CARE_PROVIDER_SITE_OTHER): Payer: Medicare Other | Admitting: Dermatology

## 2021-08-02 ENCOUNTER — Encounter: Payer: Self-pay | Admitting: Dermatology

## 2021-08-02 DIAGNOSIS — L821 Other seborrheic keratosis: Secondary | ICD-10-CM

## 2021-08-02 DIAGNOSIS — L570 Actinic keratosis: Secondary | ICD-10-CM | POA: Diagnosis not present

## 2021-08-02 DIAGNOSIS — L820 Inflamed seborrheic keratosis: Secondary | ICD-10-CM | POA: Diagnosis not present

## 2021-08-02 DIAGNOSIS — L578 Other skin changes due to chronic exposure to nonionizing radiation: Secondary | ICD-10-CM

## 2021-08-02 DIAGNOSIS — D692 Other nonthrombocytopenic purpura: Secondary | ICD-10-CM

## 2021-08-02 DIAGNOSIS — I6523 Occlusion and stenosis of bilateral carotid arteries: Secondary | ICD-10-CM

## 2021-08-02 DIAGNOSIS — Z1283 Encounter for screening for malignant neoplasm of skin: Secondary | ICD-10-CM | POA: Diagnosis not present

## 2021-08-02 DIAGNOSIS — L814 Other melanin hyperpigmentation: Secondary | ICD-10-CM | POA: Diagnosis not present

## 2021-08-02 DIAGNOSIS — D229 Melanocytic nevi, unspecified: Secondary | ICD-10-CM | POA: Diagnosis not present

## 2021-08-02 NOTE — Progress Notes (Signed)
New Patient Visit  Subjective  Chad [name] is a 66 y.o. male who presents for the following: New Patient (Initial Visit) (Some places at left side of face, arms , back, and chest. Patient denies history of skin cancer. ). The patient presents for Total-Body Skin Exam (TBSE) for skin cancer screening and mole check.  The patient has spots, moles and lesions to be evaluated, some may be new or changing and the patient has concerns that these could be cancer.  The following portions of the chart were reviewed this encounter and updated as appropriate:   Tobacco  Allergies  Meds  Problems  Med Hx  Surg Hx  Fam Hx     Review of Systems:  No other skin or systemic complaints except as noted in HPI or Assessment and Plan.  Objective  Well appearing patient in no apparent distress; mood and affect are within normal limits.  All skin waist up examined.  right temple sideburn x 1, right lateral forehead x 1,  left lower eyelid margin x 1, left lower back x 1 (4) Erythematous stuck-on, waxy papule or plaque  arms , hands x 19 (19) Erythematous thin papules/macules with gritty scale.    Assessment & Plan  Inflamed seborrheic keratosis (4) right temple sideburn x 1, right lateral forehead x 1,  left lower eyelid margin x 1, left lower back x 1 Symptomatic, irritating, patient would like treated.  Destruction of lesion - right temple sideburn x 1, right lateral forehead x 1,  left lower eyelid margin x 1, left lower back x 1 Complexity: simple   Destruction method: cryotherapy   Informed consent: discussed and consent obtained   Timeout:  patient name, date of birth, surgical site, and procedure verified Lesion destroyed using liquid nitrogen: Yes   Region frozen until ice ball extended beyond lesion: Yes   Outcome: patient tolerated procedure well with no complications   Post-procedure details: wound care instructions given   Additional details:  Prior to procedure, discussed  risks of blister formation, small wound, skin dyspigmentation, or rare scar following cryotherapy. Recommend Vaseline ointment to treated areas while healing.  Actinic keratosis (19) arms , hands x 19 Recheck in 3 months   Actinic keratoses are precancerous spots that appear secondary to cumulative UV radiation exposure/sun exposure over time. They are chronic with expected duration over 1 year. A portion of actinic keratoses will progress to squamous cell carcinoma of the skin. It is not possible to reliably predict which spots will progress to skin cancer and so treatment is recommended to prevent development of skin cancer.  Recommend daily broad spectrum sunscreen SPF 30+ to sun-exposed areas, reapply every 2 hours as needed.  Recommend staying in the shade or wearing long sleeves, sun glasses (UVA+UVB protection) and wide brim hats (4-inch brim around the entire circumference of the hat). Call for new or changing lesions.  Destruction of lesion - arms , hands x 19 Complexity: simple   Destruction method: cryotherapy   Informed consent: discussed and consent obtained   Timeout:  patient name, date of birth, surgical site, and procedure verified Lesion destroyed using liquid nitrogen: Yes   Region frozen until ice ball extended beyond lesion: Yes   Outcome: patient tolerated procedure well with no complications   Post-procedure details: wound care instructions given   Additional details:  Prior to procedure, discussed risks of blister formation, small wound, skin dyspigmentation, or rare scar following cryotherapy. Recommend Vaseline ointment to treated areas while  healing.  Seborrheic Keratoses - Stuck-on, waxy, tan-brown papules and/or plaques  - Benign-appearing - Discussed benign etiology and prognosis. - Observe - Call for any changes  Purpura - Chronic; persistent and recurrent.  Treatable, but not curable. - Violaceous macules and patches - Benign - Related to trauma, age,  sun damage and/or use of blood thinners, chronic use of topical and/or oral steroids - Observe - Can use OTC arnica containing moisturizer such as Dermend Bruise Formula if desired - Call for worsening or other concerns  Lentigines - Scattered tan macules - Due to sun exposure - Benign-appering, observe - Recommend daily broad spectrum sunscreen SPF 30+ to sun-exposed areas, reapply every 2 hours as needed. - Call for any changes  Melanocytic Nevi - Tan-brown and/or pink-flesh-colored symmetric macules and papules - Benign appearing on exam today - Observation - Call clinic for new or changing moles - Recommend daily use of broad spectrum spf 30+ sunscreen to sun-exposed areas.   Actinic Damage - chronic, secondary to cumulative UV radiation exposure/sun exposure over time - diffuse scaly erythematous macules with underlying dyspigmentation - Recommend daily broad spectrum sunscreen SPF 30+ to sun-exposed areas, reapply every 2 hours as needed.  - Recommend staying in the shade or wearing long sleeves, sun glasses (UVA+UVB protection) and wide brim hats (4-inch brim around the entire circumference of the hat). - Call for new or changing lesions.  Return in about 3 months (around 11/02/2021) for isks and aks follow up.  IRuthell Rummage, CMA, am acting as scribe for Sarina Ser, MD. Documentation: I have reviewed the above documentation for accuracy and completeness, and I agree with the above.  Sarina Ser, MD

## 2021-08-02 NOTE — Patient Instructions (Addendum)
Actinic keratoses are precancerous spots that appear secondary to cumulative UV radiation exposure/sun exposure over time. They are chronic with expected duration over 1 year. A portion of actinic keratoses will progress to squamous cell carcinoma of the skin. It is not possible to reliably predict which spots will progress to skin cancer and so treatment is recommended to prevent development of skin cancer.  Recommend daily broad spectrum sunscreen SPF 30+ to sun-exposed areas, reapply every 2 hours as needed.  Recommend staying in the shade or wearing long sleeves, sun glasses (UVA+UVB protection) and wide brim hats (4-inch brim around the entire circumference of the hat). Call for new or changing lesions.   Cryotherapy Aftercare  Wash gently with soap and water everyday.   Apply Vaseline and Band-Aid daily until healed.   Seborrheic Keratosis  What causes seborrheic keratoses? Seborrheic keratoses are harmless, common skin growths that first appear during adult life.  As time goes by, more growths appear.  Some people may develop a large number of them.  Seborrheic keratoses appear on both covered and uncovered body parts.  They are not caused by sunlight.  The tendency to develop seborrheic keratoses can be inherited.  They vary in color from skin-colored to gray, brown, or even black.  They can be either smooth or have a rough, warty surface.   Seborrheic keratoses are superficial and look as if they were stuck on the skin.  Under the microscope this type of keratosis looks like layers upon layers of skin.  That is why at times the top layer may seem to fall off, but the rest of the growth remains and re-grows.    Treatment Seborrheic keratoses do not need to be treated, but can easily be removed in the office.  Seborrheic keratoses often cause symptoms when they rub on clothing or jewelry.  Lesions can be in the way of shaving.  If they become inflamed, they can cause itching, soreness, or  burning.  Removal of a seborrheic keratosis can be accomplished by freezing, burning, or surgery. If any spot bleeds, scabs, or grows rapidly, please return to have it checked, as these can be an indication of a skin cancer.          Due to recent changes in healthcare laws, you may see results of your pathology and/or laboratory studies on MyChart before the doctors have had a chance to review them. We understand that in some cases there may be results that are confusing or concerning to you. Please understand that not all results are received at the same time and often the doctors may need to interpret multiple results in order to provide you with the best plan of care or course of treatment. Therefore, we ask that you please give [place] 2 business days to thoroughly review all your results before contacting the office for clarification. Should we see a critical lab result, you will be contacted sooner.   If You Need Anything After Your Visit  If you have any questions or concerns for your doctor, please call our main line at [phone] and press option 4 to reach your doctor's medical assistant. If no one answers, please leave a voicemail as directed and we will return your call as soon as possible. Messages left after 4 pm will be answered the following business day.   You may also send [place] a message via [place]. We typically respond to MyChart messages within 1-2 business days.  For prescription refills, please ask your pharmacy  to contact our office. Our fax number is [phone].  If you have an urgent issue when the clinic is closed that cannot wait until the next business day, you can page your doctor at the number below.    Please note that while we do our best to be available for urgent issues outside of office hours, we are not available 24/7.   If you have an urgent issue and are unable to reach [place], you may choose to seek medical care at your doctor's office, retail clinic,  urgent care center, or emergency room.  If you have a medical emergency, please immediately call 911 or go to the emergency department.  Pager Numbers  - Dr. Nehemiah Massed: [phone]  - Dr. Laurence Ferrari: [phone]  - Dr. Nicole Kindred: [phone]  In the event of inclement weather, please call our main line at [phone] for an update on the status of any delays or closures.  Dermatology Medication Tips: Please keep the boxes that topical medications come in in order to help keep track of the instructions about where and how to use these. Pharmacies typically print the medication instructions only on the boxes and not directly on the medication tubes.   If your medication is too expensive, please contact our office at [phone] option 4 or send [place] a message through [place].   We are unable to tell what your co-pay for medications will be in advance as this is different depending on your insurance coverage. However, we may be able to find a substitute medication at lower cost or fill out paperwork to get insurance to cover a needed medication.   If a prior authorization is required to get your medication covered by your insurance company, please allow [place] 1-2 business days to complete this process.  Drug prices often vary depending on where the prescription is filled and some pharmacies may offer cheaper prices.  The website www.goodrx.com contains coupons for medications through different pharmacies. The prices here do not account for what the cost may be with help from insurance (it may be cheaper with your insurance), but the website can give you the price if you did not use any insurance.  - You can print the associated coupon and take it with your prescription to the pharmacy.  - You may also stop by our office during regular business hours and pick up a GoodRx coupon card.  - If you need your prescription sent electronically to a different pharmacy, notify our office through [facility] or by phone at [phone] option 4.     Si Usted Necesita Algo Despus de Su Visita  Tambin puede enviarnos un mensaje a travs de Pharmacist, community. Por lo general respondemos a los mensajes de MyChart en el transcurso de 1 a 2 das hbiles.  Para renovar recetas, por favor pida a su farmacia que se ponga en contacto con nuestra oficina. Harland Dingwall de fax es [place] [phone].  Si tiene un asunto urgente cuando la clnica est cerrada y que no puede esperar hasta el siguiente da hbil, puede llamar/localizar a su doctor(a) al nmero que aparece a continuacin.   Por favor, tenga en cuenta que aunque hacemos todo lo posible para estar disponibles para asuntos urgentes fuera del horario de [place], no estamos disponibles las 24 horas del da, los 7 das de la [place].   Si tiene un problema urgente y no puede comunicarse con nosotros, puede optar por buscar atencin mdica  en el consultorio de su doctor(a), en [facility]  clnica privada, en un centro de atencin urgente o en una sala de emergencias.  Si tiene Engineering geologist, por favor llame inmediatamente al 911 o vaya a la sala de emergencias.  Nmeros de bper  - Dr. Nehemiah Massed: [phone]  - Dra. Moye: [phone]  - Dra. Nicole Kindred: [phone]  En caso de inclemencias del [place], por favor llame a Johnsie Kindred principal al [phone] para una actualizacin sobre el [place] de cualquier retraso o cierre.  Consejos para la medicacin en dermatologa: Por favor, guarde las cajas en las que vienen los medicamentos de uso tpico para ayudarle a seguir las instrucciones sobre dnde y cmo usarlos. Las farmacias generalmente imprimen las instrucciones del medicamento slo en las cajas y no directamente en los tubos del [place].   Si su medicamento es muy caro, por favor, pngase en contacto con Zigmund Daniel llamando al [phone] y presione la opcin 4 o envenos un mensaje a travs de Pharmacist, community.   No podemos decirle cul  ser su copago por los medicamentos por adelantado ya que esto es diferente dependiendo de la cobertura de su seguro. Sin embargo, es posible que podamos encontrar un medicamento sustituto a Electrical engineer un formulario para que el seguro cubra el medicamento que se considera necesario.   Si se requiere una autorizacin previa para que su compaa de seguros [place] su medicamento, por favor permtanos de 1 a 2 das hbiles para completar este proceso.  Los precios de los medicamentos varan con frecuencia dependiendo del Environmental consultant de dnde se surte la receta y alguna farmacias pueden ofrecer precios ms baratos.  El sitio web www.goodrx.com tiene cupones para medicamentos de Airline pilot. Los precios aqu no tienen en cuenta lo que podra costar con la ayuda del seguro (puede ser ms barato con su seguro), pero el sitio web puede darle el precio si no utiliz Research scientist (physical sciences).  - Puede imprimir el cupn correspondiente y llevarlo con su receta a la farmacia.  - Tambin puede pasar por nuestra oficina durante el horario de atencin regular y Charity fundraiser una tarjeta de cupones de GoodRx.  - Si necesita que su receta se enve electrnicamente a una farmacia diferente, informe a nuestra oficina a travs de MyChart de [place] o por telfono llamando al [phone] y presione la opcin 4.

## 2021-08-04 ENCOUNTER — Other Ambulatory Visit: Payer: Self-pay | Admitting: Internal Medicine

## 2021-08-10 ENCOUNTER — Other Ambulatory Visit: Payer: Self-pay | Admitting: Family Medicine

## 2021-08-10 NOTE — Telephone Encounter (Signed)
Mounjaro Last filled:  07/22/21, #2 mL Last OV:  06/25/21, CPE Next OV:  none

## 2021-08-11 NOTE — Telephone Encounter (Signed)
Please call patient - how did he do with 68m dose of Mounjaro and is he ready to try 7.544mweekly dose?

## 2021-08-13 NOTE — Telephone Encounter (Signed)
ERx 7.5mg  dose

## 2021-09-13 ENCOUNTER — Other Ambulatory Visit: Payer: Self-pay | Admitting: Family Medicine

## 2021-09-14 ENCOUNTER — Other Ambulatory Visit: Payer: Self-pay | Admitting: Family

## 2021-09-14 DIAGNOSIS — S299XXA Unspecified injury of thorax, initial encounter: Secondary | ICD-10-CM

## 2021-09-14 DIAGNOSIS — R0781 Pleurodynia: Secondary | ICD-10-CM

## 2021-09-14 NOTE — Telephone Encounter (Signed)
Spoke with pt asking how he's tolerating 7.5 mg.  Says he's doing good, no issues.

## 2021-09-15 ENCOUNTER — Other Ambulatory Visit: Payer: Self-pay | Admitting: Family Medicine

## 2021-09-15 ENCOUNTER — Encounter: Payer: Self-pay | Admitting: Family Medicine

## 2021-09-15 ENCOUNTER — Ambulatory Visit (INDEPENDENT_AMBULATORY_CARE_PROVIDER_SITE_OTHER): Payer: Medicare Other | Admitting: Family Medicine

## 2021-09-15 VITALS — BP 126/66 | HR 74 | Temp 97.3°F | Ht 70.0 in | Wt 262.0 lb

## 2021-09-15 DIAGNOSIS — E1169 Type 2 diabetes mellitus with other specified complication: Secondary | ICD-10-CM | POA: Diagnosis not present

## 2021-09-15 DIAGNOSIS — E113559 Type 2 diabetes mellitus with stable proliferative diabetic retinopathy, unspecified eye: Secondary | ICD-10-CM

## 2021-09-15 DIAGNOSIS — M25472 Effusion, left ankle: Secondary | ICD-10-CM | POA: Diagnosis not present

## 2021-09-15 DIAGNOSIS — I6523 Occlusion and stenosis of bilateral carotid arteries: Secondary | ICD-10-CM

## 2021-09-15 DIAGNOSIS — M25572 Pain in left ankle and joints of left foot: Secondary | ICD-10-CM

## 2021-09-15 MED ORDER — COLCHICINE 0.6 MG PO TABS: 0.6000 mg | ORAL_TABLET | Freq: Every day | ORAL | 0 refills | Status: DC | PRN

## 2021-09-15 MED ORDER — PREDNISONE 20 MG PO TABS: ORAL_TABLET | ORAL | 0 refills | Status: DC

## 2021-09-15 NOTE — Assessment & Plan Note (Addendum)
Suspect gout given severity of pain to palpation, r/o septic joint.  Check labs today including urate, ESR, CBC.  Start colchicine (hold lipitor on days taken) and prednisone taper.  Update if not improving with treatment.

## 2021-09-15 NOTE — Patient Instructions (Addendum)
This may be gout. Labs today.  Start colchicine 2 pills today, then take 1 pill daily until pain and swelling has improved. Hold lipitor on days you take colchicine.  Start prednisone taper sent to pharmacy.  Caution with sugars and breads while on the prednisone steroid.  Let us know if worsening, streaking redness, fever/chills, nausea.

## 2021-09-15 NOTE — Telephone Encounter (Signed)
ERx 

## 2021-09-15 NOTE — Progress Notes (Signed)
Patient ID: Chad [name], male    DOB: 04/15/1955, 66 y.o.   MRN: [id number]  This visit was conducted in person.  BP 126/66   Pulse 74   Temp (!) 97.3 F (36.3 C) (Temporal)   Ht _0  (1.778 m)   Wt 262 lb (118.8 kg)   SpO2 97%   BMI 37.59 kg/m    CC: L foot pain  Subjective:   HPI: Chad [name] is a 66 y.o. male presenting on 09/15/2021 for Foot Pain (C/o L foot pain/swelling.  Pain is in top of foot up to ankle. Started 09/13/21. Denies injury.)   2d h/o L lateral ankle pain with radiation to proximal dorsal foot. Some redness as well.   No fevers/chills.  Denies inciting trauma/injury or falls.  No known h/o gout.  No recent increase in beer, shrimp, red meat/organ meat.   Tried icy hot and tylenol without benefit. Took hydrocodone 9m with some benefit. He has been elevating leg.      Relevant past medical, surgical, family and social history reviewed and updated as indicated. Interim medical history since our last visit reviewed. Allergies and medications reviewed and updated. Outpatient Medications Prior to Visit  Medication Sig Dispense Refill   aspirin 81 MG chewable tablet Chew 81 mg by mouth daily.     atorvastatin (LIPITOR) 20 MG tablet TAKE 1 TABLET BY MOUTH ONCE DAILY 90 tablet 1   bumetanide (BUMEX) 2 MG tablet Take 2 mg by mouth daily. Additional  2 mg if gain 3 lbs     citalopram (CELEXA) 20 MG tablet TAKE 1 TABLET BY MOUTH ONCE DAILY 90 tablet 1   dapagliflozin propanediol (FARXIGA) 10 MG TABS tablet Take 10 mg by mouth daily before breakfast.     DEXILANT 60 MG capsule TAKE 1 CAPSULE BY MOUTH ONCE DAILY 30 capsule 8   diazepam (VALIUM) 5 MG tablet Take 1 tablet (5 mg total) by mouth every 12 (twelve) hours as needed for anxiety. 30 tablet 1   doxycycline (VIBRA-TABS) 100 MG tablet Take 1 tablet (100 mg total) by mouth 2 (two) times daily. 20 tablet 0   famotidine (PEPCID) 20 MG tablet TAKE 1 TABLET BY MOUTH EVERY NIGHT AT BEDTIME (Patient taking  differently: Take 20 mg by mouth daily.) 90 tablet 1   ferrous sulfate 325 (65 FE) MG EC tablet Take 1 tablet (325 mg total) by mouth daily with breakfast.     fluticasone (FLONASE) 50 MCG/ACT nasal spray Place 2 sprays into both nostrils daily. 16 g 6   isosorbide mononitrate (IMDUR) 60 MG 24 hr tablet TAKE 2 TABLETS BY MOUTH DAILY AND 1 TABLET AT BEDTIME 90 tablet 1   levothyroxine (SYNTHROID) 125 MCG tablet TAKE 1 TAB BY MOUTH ONCE DAILY. TAKE ON AN EMPTY STOMACH WITH A GLASS OF WATER ATLEAST 30-60 MINUTES BEFORE BREAKFAST 90 tablet 3   Magnesium 400 MG TABS Take 400 mg by mouth 2 (two) times daily.     methocarbamol (ROBAXIN) 500 MG tablet Take 1 tablet (500 mg total) by mouth 2 (two) times daily as needed for muscle spasms (sedation precautions). 20 tablet 0   metoCLOPramide (REGLAN) 5 MG tablet TAKE 1 TABLET BY MOUTH 3 TIMES DAILY BEFORE MEALS 120 tablet 1   metolazone (ZAROXOLYN) 2.5 MG tablet Take 1 tablet (2.5 mg total) by mouth once a week. As needed for fluid/swelling     metoprolol succinate (TOPROL-XL) 25 MG 24 hr tablet TAKE 1 TABLET BY MOUTH  EVERY MORNING 30 tablet 5   MOUNJARO 7.5 MG/0.5ML Pen INJECT 7.5 MG INTO THE SKIN ONCE A WEEK 2 mL 1   Multiple Vitamin (MULITIVITAMIN WITH MINERALS) TABS Take 1 tablet by mouth daily.     nitroGLYCERIN (NITROSTAT) 0.4 MG SL tablet Place 1 tablet (0.4 mg total) under the tongue every 5 (five) minutes as needed for chest pain. 60 tablet 3   potassium chloride (KLOR-CON M) 10 MEQ tablet Take 1 tablet (10 mEq total) by mouth 2 (two) times daily. 180 tablet 3   spironolactone (ALDACTONE) 25 MG tablet Take 25 mg by mouth daily.     VENTOLIN HFA 108 (90 Base) MCG/ACT inhaler INHALE 2 PUFFS INTO THE LUNGS EVERY 6 HOURS AS NEEDED FOR WHEEZING OR SHORTNESS OF BREATH 18 g 5   vitamin B-12 (CYANOCOBALAMIN) 500 MCG tablet Take 1 tablet (500 mcg total) by mouth daily.     vitamin E 400 UNIT capsule Take 400 Units by mouth daily.     No facility-administered  medications prior to visit.     Per HPI unless specifically indicated in ROS section below Review of Systems  Objective:  BP 126/66   Pulse 74   Temp (!) 97.3 F (36.3 C) (Temporal)   Ht _0  (1.778 m)   Wt 262 lb (118.8 kg)   SpO2 97%   BMI 37.59 kg/m   Wt Readings from Last 3 Encounters:  09/15/21 262 lb (118.8 kg)  06/25/21 267 lb (121.1 kg)  05/24/21 255 lb (115.7 kg)      Physical Exam Vitals and nursing note reviewed.  Constitutional:      Appearance: Normal appearance. He is not ill-appearing.  Musculoskeletal:        General: Swelling and tenderness present.     Right lower leg: No edema.     Left lower leg: No edema.     Comments:  2+ DP bilaterally Marked swelling, tenderness, erythema and warmth to lateral L ankle along malleolus  Discomfort with ligament testing without joint stiffness  No pain of achilles, no pain at base of 5th MT.   Skin:    General: Skin is warm and dry.     Findings: Erythema present. No rash.  Neurological:     Mental Status: He is alert.       Lab Results  Component Value Date   HGBA1C 5.7 (A) 03/01/2021     Assessment & Plan:   Problem List Items Addressed This Visit     Controlled type 2 diabetes mellitus with retinopathy ([place])    Update A1c. Discussed caution with hyperglycemia, need to monitor sugar and carb intake while on prednisone taper.      Relevant Orders   Hemoglobin A1c   Pain and swelling of left ankle - Primary    Suspect gout given severity of pain to palpation, r/o septic joint.  Check labs today including urate, ESR, CBC.  Start colchicine (hold lipitor on days taken) and prednisone taper.  Update if not improving with treatment.       Relevant Orders   Sedimentation rate   C-reactive protein   CBC with Differential/Platelet   Basic metabolic panel   Uric acid   Other Visit Diagnoses     Type 2 diabetes mellitus with other specified complication, without long-term current use of insulin  (HCC)       Relevant Orders   Hemoglobin A1c        Meds ordered this encounter  Medications  colchicine 0.6 MG tablet    Sig: Take 1 tablet (0.6 mg total) by mouth daily as needed (gout flare). May take 2 tablets on first day of gout flare    Dispense:  30 tablet    Refill:  0   predniSONE (DELTASONE) 20 MG tablet    Sig: Take two tablets daily for 5 days followed by one tablet daily for 5 days    Dispense:  15 tablet    Refill:  0   Orders Placed This Encounter  Procedures   Sedimentation rate   C-reactive protein   CBC with Differential/Platelet   Basic metabolic panel   Uric acid   Hemoglobin A1c     Patient Instructions  This may be gout. Labs today.  Start colchicine 2 pills today, then take 1 pill daily until pain and swelling has improved. Hold lipitor on days you take colchicine.  Start prednisone taper sent to pharmacy.  Caution with sugars and breads while on the prednisone steroid.  Let us know if worsening, streaking redness, fever/chills, nausea.   Follow up plan: Return if symptoms worsen or fail to improve.  [name], MD

## 2021-09-15 NOTE — Assessment & Plan Note (Signed)
Update A1c. Discussed caution with hyperglycemia, need to monitor sugar and carb intake while on prednisone taper.

## 2021-09-16 LAB — BASIC METABOLIC PANEL
BUN: 17 mg/dL (ref 6–23)
CO2: 29 mEq/L (ref 19–32)
Calcium: 8.9 mg/dL (ref 8.4–10.5)
Chloride: 97 mEq/L (ref 96–112)
Creatinine, Ser: 1.06 mg/dL (ref 0.40–1.50)
GFR: 73.11 mL/min (ref >60.00)
Glucose, Bld: 114 mg/dL — ABNORMAL HIGH (ref 70–99)
Potassium: 3.6 mEq/L (ref 3.5–5.1)
Sodium: 137 mEq/L (ref 135–145)

## 2021-09-16 LAB — CBC WITH DIFFERENTIAL/PLATELET
Basophils Absolute: 0.1 10*3/uL (ref 0.0–0.1)
Basophils Relative: 1.1 % (ref 0.0–3.0)
Eosinophils Absolute: 0.1 10*3/uL (ref 0.0–0.7)
Eosinophils Relative: 1.4 % (ref 0.0–5.0)
HCT: 45.8 % (ref 39.0–52.0)
Hemoglobin: 15.8 g/dL (ref 13.0–17.0)
Lymphocytes Relative: 16.8 % (ref 12.0–46.0)
Lymphs Abs: 1 10*3/uL (ref 0.7–4.0)
MCHC: 34.6 g/dL (ref 30.0–36.0)
MCV: 86.7 fl (ref 78.0–100.0)
Monocytes Absolute: 0.6 10*3/uL (ref 0.1–1.0)
Monocytes Relative: 9.2 % (ref 3.0–12.0)
Neutro Abs: 4.4 10*3/uL (ref 1.4–7.7)
Neutrophils Relative %: 71.5 % (ref 43.0–77.0)
Platelets: 144 10*3/uL — ABNORMAL LOW (ref 150.0–400.0)
RBC: 5.28 Mil/uL (ref 4.22–5.81)
RDW: 13.2 % (ref 11.5–15.5)
WBC: 6.2 10*3/uL (ref 4.0–10.5)

## 2021-09-16 LAB — URIC ACID: Uric Acid, Serum: 8.5 mg/dL — ABNORMAL HIGH (ref 4.0–7.8)

## 2021-09-16 LAB — SEDIMENTATION RATE: Sed Rate: 6 mm/hr (ref 0–20)

## 2021-09-16 LAB — C-REACTIVE PROTEIN: CRP: <1 mg/dL (ref 0.5–20.0)

## 2021-09-16 LAB — HEMOGLOBIN A1C: Hgb A1c MFr Bld: 6.4 % (ref 4.6–6.5)

## 2021-09-16 NOTE — Telephone Encounter (Signed)
Call from pt stating he checked with [facility] and told they do not have any refills available for pt. He asks rx be sent in.

## 2021-09-16 NOTE — Telephone Encounter (Signed)
Imdur Last filled 6/223/23, #90 Last OV:  09/15/21, L ankle pain/swelling; 06/25/21, CPE Next OV:  none

## 2021-09-17 ENCOUNTER — Ambulatory Visit: Payer: Medicare Other | Admitting: Family Medicine

## 2021-09-17 NOTE — Telephone Encounter (Signed)
[facility] calling regarding this medication, Patient has enough to get through Tuesday 8.1.23, please advise when refill is submitted

## 2021-10-08 ENCOUNTER — Telehealth: Payer: Self-pay | Admitting: Family Medicine

## 2021-10-08 MED ORDER — PREDNISONE 20 MG PO TABS: ORAL_TABLET | ORAL | 0 refills | Status: DC

## 2021-10-08 NOTE — Telephone Encounter (Signed)
Lvm asking pt to call back.  Need to relay Dr. G's message.  

## 2021-10-08 NOTE — Telephone Encounter (Signed)
  Encourage patient to contact the pharmacy for refills or they can request refills through [facility]  Did the patient contact the pharmacy: No  LAST APPOINTMENT DATE: 09/15/21  NEXT APPOINTMENT DATE: N/A  MEDICATION: predniSONE (DELTASONE) 20 MG tablet  Is the patient out of medication? Yes  PHARMACY: [place], [place]  Comment: Patient has gout in both feet now and they are slightly swelling.   Let patient know to contact pharmacy at the end of the day to make sure medication is ready.  Please notify patient to allow 48-72 hours to process

## 2021-10-08 NOTE — Telephone Encounter (Signed)
Spoke with pt relaying Dr. G's message. Pt verbalizes understanding.  

## 2021-10-08 NOTE — Telephone Encounter (Signed)
He should start treatment with colchicine when he gets gout flare.  I've refilled prednisone in case colchicine doesn't work.  He will need OV if not better with this or if recurrent to discuss daily gout lowering medication.

## 2021-10-08 NOTE — Telephone Encounter (Signed)
Spoke with pt asking about refill request.  States he had gotten better for a couple of wks.  But now has gout in both feet.  Plz advise.

## 2021-10-08 NOTE — Addendum Note (Signed)
Addended by: [name] on: 10/08/2021 01:38 PM   Modules accepted: Orders

## 2021-10-26 ENCOUNTER — Other Ambulatory Visit: Payer: Self-pay | Admitting: Family Medicine

## 2021-10-26 ENCOUNTER — Other Ambulatory Visit: Payer: Self-pay | Admitting: Internal Medicine

## 2021-11-01 ENCOUNTER — Ambulatory Visit: Payer: Medicare Other | Admitting: Dermatology

## 2021-11-08 ENCOUNTER — Other Ambulatory Visit: Payer: Self-pay | Admitting: Family Medicine

## 2021-11-08 NOTE — Telephone Encounter (Signed)
Refill request Mourjaro Last refill 09/15/21 Last office visit 09/15/21  2 ml/1

## 2021-11-09 ENCOUNTER — Other Ambulatory Visit: Payer: Self-pay | Admitting: Family Medicine

## 2021-11-09 DIAGNOSIS — E1169 Type 2 diabetes mellitus with other specified complication: Secondary | ICD-10-CM

## 2021-11-09 NOTE — Telephone Encounter (Signed)
Imdur Last filled:  10/12/21, #90 Last OV: 09/15/21, L ankle pain/swelling Next OV:  none

## 2021-11-18 ENCOUNTER — Encounter: Payer: Self-pay | Admitting: Dermatology

## 2021-11-18 ENCOUNTER — Ambulatory Visit (INDEPENDENT_AMBULATORY_CARE_PROVIDER_SITE_OTHER): Payer: Medicare Other | Admitting: Dermatology

## 2021-11-18 DIAGNOSIS — L821 Other seborrheic keratosis: Secondary | ICD-10-CM

## 2021-11-18 DIAGNOSIS — L578 Other skin changes due to chronic exposure to nonionizing radiation: Secondary | ICD-10-CM

## 2021-11-18 DIAGNOSIS — B079 Viral wart, unspecified: Secondary | ICD-10-CM | POA: Diagnosis not present

## 2021-11-18 DIAGNOSIS — L814 Other melanin hyperpigmentation: Secondary | ICD-10-CM | POA: Diagnosis not present

## 2021-11-18 DIAGNOSIS — L570 Actinic keratosis: Secondary | ICD-10-CM | POA: Diagnosis not present

## 2021-11-18 DIAGNOSIS — L820 Inflamed seborrheic keratosis: Secondary | ICD-10-CM | POA: Diagnosis not present

## 2021-11-18 DIAGNOSIS — I6523 Occlusion and stenosis of bilateral carotid arteries: Secondary | ICD-10-CM | POA: Diagnosis not present

## 2021-11-18 NOTE — Progress Notes (Signed)
Follow-Up Visit   Subjective  Chad [name] is a 66 y.o. male who presents for the following: Actinic Keratosis (Arms, hands, 60mf/u), ISK f/u (Face, back, 387m/u), and check spot (Back, 24m1024mrritating/R elbow, just noticed). The patient has spots, moles and lesions to be evaluated, some may be new or changing and the patient has concerns that these could be cancer.  The following portions of the chart were reviewed this encounter and updated as appropriate:   Tobacco  Allergies  Meds  Problems  Med Hx  Surg Hx  Fam Hx     Review of Systems:  No other skin or systemic complaints except as noted in HPI or Assessment and Plan.  Objective  Well appearing patient in no apparent distress; mood and affect are within normal limits.  A focused examination was performed including face, arms, hands, back. Relevant physical exam findings are noted in the Assessment and Plan.  Left Flank x 1 Stuck on waxy paps with erythema  L face x 1,L hand x 8, L arm x 1, R hand x 2 (12) Pink scaly macules  R elbow x 3 (3) Verrucous papules -- Discussed viral etiology and contagion.    Assessment & Plan   Actinic Damage - chronic, secondary to cumulative UV radiation exposure/sun exposure over time - diffuse scaly erythematous macules with underlying dyspigmentation - Recommend daily broad spectrum sunscreen SPF 30+ to sun-exposed areas, reapply every 2 hours as needed.  - Recommend staying in the shade or wearing long sleeves, sun glasses (UVA+UVB protection) and wide brim hats (4-inch brim around the entire circumference of the hat). - Call for new or changing lesions.   Seborrheic Keratoses - Stuck-on, waxy, tan-brown papules and/or plaques  - Benign-appearing - Discussed benign etiology and prognosis. - Observe - Call for any changes  Lentigines - Scattered tan macules - Due to sun exposure - Benign-appearing, observe - Recommend daily broad spectrum sunscreen SPF 30+ to  sun-exposed areas, reapply every 2 hours as needed. - Call for any changes   Inflamed seborrheic keratosis Left Flank x 1 Symptomatic, irritating, patient would like treated. Destruction of lesion - Left Flank x 1 Complexity: simple   Destruction method: cryotherapy   Informed consent: discussed and consent obtained   Timeout:  patient name, date of birth, surgical site, and procedure verified Lesion destroyed using liquid nitrogen: Yes   Region frozen until ice ball extended beyond lesion: Yes   Outcome: patient tolerated procedure well with no complications   Post-procedure details: wound care instructions given    AK (actinic keratosis) (12) L face x 1,L hand x 8, L arm x 1, R hand x 2 Destruction of lesion - L face x 1,L hand x 8, L arm x 1, R hand x 2 Complexity: simple   Destruction method: cryotherapy   Informed consent: discussed and consent obtained   Timeout:  patient name, date of birth, surgical site, and procedure verified Lesion destroyed using liquid nitrogen: Yes   Region frozen until ice ball extended beyond lesion: Yes   Outcome: patient tolerated procedure well with no complications   Post-procedure details: wound care instructions given    Viral warts, unspecified type (3) R elbow x 3 Discussed viral etiology and risk of spread.  Discussed multiple treatments may be required to clear warts.  Discussed possible post-treatment dyspigmentation and risk of recurrence.  Destruction of lesion - R elbow x 3 Complexity: simple   Destruction method: cryotherapy  Informed consent: discussed and consent obtained   Timeout:  patient name, date of birth, surgical site, and procedure verified Lesion destroyed using liquid nitrogen: Yes   Region frozen until ice ball extended beyond lesion: Yes   Outcome: patient tolerated procedure well with no complications   Post-procedure details: wound care instructions given    Return in about 6 months (around 05/19/2022) for  UBSE, Hx of AKs.  I, Othelia Pulling, RMA, am acting as scribe for Sarina Ser, MD . Documentation: I have reviewed the above documentation for accuracy and completeness, and I agree with the above.  Sarina Ser, MD

## 2021-11-18 NOTE — Patient Instructions (Addendum)
Cryotherapy Aftercare  Wash gently with soap and water everyday.   Apply Vaseline and Band-Aid daily until healed.     Due to recent changes in healthcare laws, you may see results of your pathology and/or laboratory studies on MyChart before the doctors have had a chance to review them. We understand that in some cases there may be results that are confusing or concerning to you. Please understand that not all results are received at the same time and often the doctors may need to interpret multiple results in order to provide you with the best plan of care or course of treatment. Therefore, we ask that you please give [place] 2 business days to thoroughly review all your results before contacting the office for clarification. Should we see a critical lab result, you will be contacted sooner.   If You Need Anything After Your Visit  If you have any questions or concerns for your doctor, please call our main line at [phone] and press option 4 to reach your doctor's medical assistant. If no one answers, please leave a voicemail as directed and we will return your call as soon as possible. Messages left after 4 pm will be answered the following business day.   You may also send [place] a message via [place]. We typically respond to MyChart messages within 1-2 business days.  For prescription refills, please ask your pharmacy to contact our office. Our fax number is [phone].  If you have an urgent issue when the clinic is closed that cannot wait until the next business day, you can page your doctor at the number below.    Please note that while we do our best to be available for urgent issues outside of office hours, we are not available 24/7.   If you have an urgent issue and are unable to reach [place], you may choose to seek medical care at your doctor's office, retail clinic, urgent care center, or emergency room.  If you have a medical emergency, please immediately call 911 or go to the  emergency department.  Pager Numbers  - Dr. Nehemiah Massed: [phone]  - Dr. Laurence Ferrari: [phone]  - Dr. Nicole Kindred: [phone]  In the event of inclement weather, please call our main line at [phone] for an update on the status of any delays or closures.  Dermatology Medication Tips: Please keep the boxes that topical medications come in in order to help keep track of the instructions about where and how to use these. Pharmacies typically print the medication instructions only on the boxes and not directly on the medication tubes.   If your medication is too expensive, please contact our office at [phone] option 4 or send [place] a message through [place].   We are unable to tell what your co-pay for medications will be in advance as this is different depending on your insurance coverage. However, we may be able to find a substitute medication at lower cost or fill out paperwork to get insurance to cover a needed medication.   If a prior authorization is required to get your medication covered by your insurance company, please allow [place] 1-2 business days to complete this process.  Drug prices often vary depending on where the prescription is filled and some pharmacies may offer cheaper prices.  The website www.goodrx.com contains coupons for medications through different pharmacies. The prices here do not account for what the cost may be with help from insurance (it may be cheaper with your insurance), but the website can  give you the price if you did not use any insurance.  - You can print the associated coupon and take it with your prescription to the pharmacy.  - You may also stop by our office during regular business hours and pick up a GoodRx coupon card.  - If you need your prescription sent electronically to a different pharmacy, notify our office through [facility] or by phone at [phone] option 4.     Si Usted Necesita Algo Despus de Su Visita  Tambin puede  enviarnos un mensaje a travs de Pharmacist, community. Por lo general respondemos a los mensajes de MyChart en el transcurso de 1 a 2 das hbiles.  Para renovar recetas, por favor pida a su farmacia que se ponga en contacto con nuestra oficina. Harland Dingwall de fax es [place] [phone].  Si tiene un asunto urgente cuando la clnica est cerrada y que no puede esperar hasta el siguiente da hbil, puede llamar/localizar a su doctor(a) al nmero que aparece a continuacin.   Por favor, tenga en cuenta que aunque hacemos todo lo posible para estar disponibles para asuntos urgentes fuera del horario de [place], no estamos disponibles las 24 horas del da, los 7 das de la [place].   Si tiene un problema urgente y no puede comunicarse con nosotros, puede optar por buscar atencin mdica  en el consultorio de su doctor(a), en una clnica privada, en un centro de atencin urgente o en una sala de emergencias.  Si tiene Engineering geologist, por favor llame inmediatamente al 911 o vaya a la sala de emergencias.  Nmeros de bper  - Dr. Nehemiah Massed: [phone]  - Dra. Moye: [phone]  - Dra. Nicole Kindred: [phone]  En caso de inclemencias del [place], por favor llame a Johnsie Kindred principal al [phone] para una actualizacin sobre el [place] de cualquier retraso o cierre.  Consejos para la medicacin en dermatologa: Por favor, guarde las cajas en las que vienen los medicamentos de uso tpico para ayudarle a seguir las instrucciones sobre dnde y cmo usarlos. Las farmacias generalmente imprimen las instrucciones del medicamento slo en las cajas y no directamente en los tubos del [place].   Si su medicamento es muy caro, por favor, pngase en contacto con Zigmund Daniel llamando al [phone] y presione la opcin 4 o envenos un mensaje a travs de Pharmacist, community.   No podemos decirle cul ser su copago por los medicamentos por adelantado ya que esto es diferente dependiendo de la cobertura de su seguro.  Sin embargo, es posible que podamos encontrar un medicamento sustituto a Electrical engineer un formulario para que el seguro cubra el medicamento que se considera necesario.   Si se requiere una autorizacin previa para que su compaa de seguros [place] su medicamento, por favor permtanos de 1 a 2 das hbiles para completar este proceso.  Los precios de los medicamentos varan con frecuencia dependiendo del Environmental consultant de dnde se surte la receta y alguna farmacias pueden ofrecer precios ms baratos.  El sitio web www.goodrx.com tiene cupones para medicamentos de Airline pilot. Los precios aqu no tienen en cuenta lo que podra costar con la ayuda del seguro (puede ser ms barato con su seguro), pero el sitio web puede darle el precio si no utiliz Research scientist (physical sciences).  - Puede imprimir el cupn correspondiente y llevarlo con su receta a la farmacia.  - Tambin puede pasar por nuestra oficina durante el horario de atencin regular y Charity fundraiser una tarjeta de cupones de GoodRx.  -  Si necesita que su receta se enve electrnicamente a Chiropodist, informe a nuestra oficina a travs de MyChart de [place] o por telfono llamando al [phone] y presione la opcin 4.

## 2021-12-17 ENCOUNTER — Other Ambulatory Visit: Payer: Self-pay

## 2021-12-17 DIAGNOSIS — K7460 Unspecified cirrhosis of liver: Secondary | ICD-10-CM

## 2021-12-21 ENCOUNTER — Other Ambulatory Visit: Payer: Self-pay | Admitting: Internal Medicine

## 2021-12-21 DIAGNOSIS — Z23 Encounter for immunization: Secondary | ICD-10-CM | POA: Diagnosis not present

## 2021-12-22 DIAGNOSIS — I4510 Unspecified right bundle-branch block: Secondary | ICD-10-CM | POA: Diagnosis not present

## 2021-12-22 DIAGNOSIS — I440 Atrioventricular block, first degree: Secondary | ICD-10-CM | POA: Diagnosis not present

## 2021-12-22 DIAGNOSIS — J302 Other seasonal allergic rhinitis: Secondary | ICD-10-CM | POA: Diagnosis not present

## 2021-12-22 DIAGNOSIS — I2089 Other forms of angina pectoris: Secondary | ICD-10-CM | POA: Diagnosis not present

## 2021-12-22 DIAGNOSIS — I952 Hypotension due to drugs: Secondary | ICD-10-CM | POA: Diagnosis not present

## 2021-12-22 DIAGNOSIS — E039 Hypothyroidism, unspecified: Secondary | ICD-10-CM | POA: Diagnosis not present

## 2021-12-22 DIAGNOSIS — K3184 Gastroparesis: Secondary | ICD-10-CM | POA: Diagnosis not present

## 2021-12-22 DIAGNOSIS — Z7985 Long-term (current) use of injectable non-insulin antidiabetic drugs: Secondary | ICD-10-CM | POA: Diagnosis not present

## 2021-12-22 DIAGNOSIS — E8770 Fluid overload, unspecified: Secondary | ICD-10-CM | POA: Diagnosis not present

## 2021-12-22 DIAGNOSIS — R2233 Localized swelling, mass and lump, upper limb, bilateral: Secondary | ICD-10-CM | POA: Diagnosis not present

## 2021-12-22 DIAGNOSIS — K219 Gastro-esophageal reflux disease without esophagitis: Secondary | ICD-10-CM | POA: Diagnosis not present

## 2021-12-22 DIAGNOSIS — I110 Hypertensive heart disease with heart failure: Secondary | ICD-10-CM | POA: Diagnosis not present

## 2021-12-22 DIAGNOSIS — Z79899 Other long term (current) drug therapy: Secondary | ICD-10-CM | POA: Diagnosis not present

## 2021-12-22 DIAGNOSIS — I5030 Unspecified diastolic (congestive) heart failure: Secondary | ICD-10-CM | POA: Diagnosis not present

## 2021-12-22 DIAGNOSIS — R0602 Shortness of breath: Secondary | ICD-10-CM | POA: Diagnosis not present

## 2021-12-22 DIAGNOSIS — R0789 Other chest pain: Secondary | ICD-10-CM | POA: Diagnosis not present

## 2021-12-22 DIAGNOSIS — K7581 Nonalcoholic steatohepatitis (NASH): Secondary | ICD-10-CM | POA: Diagnosis not present

## 2021-12-22 DIAGNOSIS — R55 Syncope and collapse: Secondary | ICD-10-CM | POA: Diagnosis not present

## 2021-12-22 DIAGNOSIS — T50905A Adverse effect of unspecified drugs, medicaments and biological substances, initial encounter: Secondary | ICD-10-CM | POA: Diagnosis not present

## 2021-12-22 DIAGNOSIS — R42 Dizziness and giddiness: Secondary | ICD-10-CM | POA: Diagnosis not present

## 2021-12-22 DIAGNOSIS — I5033 Acute on chronic diastolic (congestive) heart failure: Secondary | ICD-10-CM | POA: Diagnosis not present

## 2021-12-22 DIAGNOSIS — I2510 Atherosclerotic heart disease of native coronary artery without angina pectoris: Secondary | ICD-10-CM | POA: Diagnosis not present

## 2021-12-22 DIAGNOSIS — E785 Hyperlipidemia, unspecified: Secondary | ICD-10-CM | POA: Diagnosis not present

## 2021-12-22 DIAGNOSIS — R059 Cough, unspecified: Secondary | ICD-10-CM | POA: Diagnosis not present

## 2021-12-22 DIAGNOSIS — Z79890 Hormone replacement therapy (postmenopausal): Secondary | ICD-10-CM | POA: Diagnosis not present

## 2021-12-22 DIAGNOSIS — Z794 Long term (current) use of insulin: Secondary | ICD-10-CM | POA: Diagnosis not present

## 2021-12-22 DIAGNOSIS — I10 Essential (primary) hypertension: Secondary | ICD-10-CM | POA: Diagnosis not present

## 2021-12-22 DIAGNOSIS — I5032 Chronic diastolic (congestive) heart failure: Secondary | ICD-10-CM | POA: Diagnosis not present

## 2021-12-22 DIAGNOSIS — R5383 Other fatigue: Secondary | ICD-10-CM | POA: Diagnosis not present

## 2021-12-22 DIAGNOSIS — I493 Ventricular premature depolarization: Secondary | ICD-10-CM | POA: Diagnosis not present

## 2021-12-22 DIAGNOSIS — R079 Chest pain, unspecified: Secondary | ICD-10-CM | POA: Diagnosis not present

## 2021-12-22 DIAGNOSIS — E1143 Type 2 diabetes mellitus with diabetic autonomic (poly)neuropathy: Secondary | ICD-10-CM | POA: Diagnosis not present

## 2021-12-22 DIAGNOSIS — G4733 Obstructive sleep apnea (adult) (pediatric): Secondary | ICD-10-CM | POA: Diagnosis not present

## 2021-12-22 DIAGNOSIS — F4321 Adjustment disorder with depressed mood: Secondary | ICD-10-CM | POA: Diagnosis not present

## 2021-12-22 DIAGNOSIS — Z7982 Long term (current) use of aspirin: Secondary | ICD-10-CM | POA: Diagnosis not present

## 2021-12-22 DIAGNOSIS — I499 Cardiac arrhythmia, unspecified: Secondary | ICD-10-CM | POA: Diagnosis not present

## 2021-12-22 LAB — HEMOGLOBIN A1C: Hemoglobin A1C: 5.6

## 2021-12-27 ENCOUNTER — Telehealth: Payer: Self-pay

## 2021-12-27 NOTE — Chronic Care Management (AMB) (Addendum)
Chronic Care Management Pharmacy Assistant   Name: Chad [name]  MRN: [id number] DOB: 1955/12/08  Reason for Encounter: Hospital Follow Up  Non CCM  Medications: Outpatient Encounter Medications as of 12/27/2021  Medication Sig   aspirin 81 MG chewable tablet Chew 81 mg by mouth daily.   atorvastatin (LIPITOR) 20 MG tablet TAKE 1 TABLET BY MOUTH ONCE DAILY   bumetanide (BUMEX) 2 MG tablet Take 2 mg by mouth daily. Additional  2 mg if gain 3 lbs   citalopram (CELEXA) 20 MG tablet TAKE 1 TABLET BY MOUTH ONCE DAILY   colchicine 0.6 MG tablet Take 1 tablet (0.6 mg total) by mouth daily as needed (gout flare). May take 2 tablets on first day of gout flare   dapagliflozin propanediol (FARXIGA) 10 MG TABS tablet Take 10 mg by mouth daily before breakfast.   DEXILANT 60 MG capsule TAKE 1 CAPSULE BY MOUTH ONCE DAILY   diazepam (VALIUM) 5 MG tablet Take 1 tablet (5 mg total) by mouth every 12 (twelve) hours as needed for anxiety.   doxycycline (VIBRA-TABS) 100 MG tablet Take 1 tablet (100 mg total) by mouth 2 (two) times daily.   famotidine (PEPCID) 20 MG tablet TAKE 1 TABLET BY MOUTH EVERY NIGHT AT BEDTIME (Patient taking differently: Take 20 mg by mouth daily.)   ferrous sulfate 325 (65 FE) MG EC tablet Take 1 tablet (325 mg total) by mouth daily with breakfast.   fluticasone (FLONASE) 50 MCG/ACT nasal spray Place 2 sprays into both nostrils daily.   isosorbide mononitrate (IMDUR) 60 MG 24 hr tablet TAKE 2 TABLETS BY MOUTH DAILY AND 1 TABLET AT BEDTIME   levothyroxine (SYNTHROID) 125 MCG tablet TAKE 1 TAB BY MOUTH ONCE DAILY. TAKE ON AN EMPTY STOMACH WITH A GLASS OF WATER ATLEAST 30-60 MINUTES BEFORE BREAKFAST   Magnesium 400 MG TABS Take 400 mg by mouth 2 (two) times daily.   methocarbamol (ROBAXIN) 500 MG tablet Take 1 tablet (500 mg total) by mouth 2 (two) times daily as needed for muscle spasms (sedation precautions).   metoCLOPramide (REGLAN) 5 MG tablet Take 1 tablet (5 mg total) by mouth  3 (three) times daily before meals. PLEASE schedule a yearly office visit for further refills. Thank you   metolazone (ZAROXOLYN) 2.5 MG tablet Take 1 tablet (2.5 mg total) by mouth once a week. As needed for fluid/swelling   metoprolol succinate (TOPROL-XL) 25 MG 24 hr tablet TAKE 1 TABLET BY MOUTH EVERY MORNING   MOUNJARO 7.5 MG/0.5ML Pen INJECT 7.5 MG INTO THE SKIN ONCE A WEEK   Multiple Vitamin (MULITIVITAMIN WITH MINERALS) TABS Take 1 tablet by mouth daily.   nitroGLYCERIN (NITROSTAT) 0.4 MG SL tablet Place 1 tablet (0.4 mg total) under the tongue every 5 (five) minutes as needed for chest pain.   potassium chloride (KLOR-CON M) 10 MEQ tablet Take 1 tablet (10 mEq total) by mouth 2 (two) times daily.   predniSONE (DELTASONE) 20 MG tablet Take two tablets daily for 5 days followed by one tablet daily for 5 days   spironolactone (ALDACTONE) 25 MG tablet Take 25 mg by mouth daily.   VENTOLIN HFA 108 (90 Base) MCG/ACT inhaler INHALE 2 PUFFS INTO THE LUNGS EVERY 6 HOURS AS NEEDED FOR WHEEZING OR SHORTNESS OF BREATH   vitamin B-12 (CYANOCOBALAMIN) 500 MCG tablet Take 1 tablet (500 mcg total) by mouth daily.   vitamin E 400 UNIT capsule Take 400 Units by mouth daily.   No facility-administered encounter medications on  file as of 12/27/2021.      Reviewed hospital notes for details of recent visit. Has patient been contacted by Transitions of Care team? Yes Has patient seen PCP/specialist for hospital follow up (summarize OV if yes): No  Admitted to the hospital on 12/22/21. Discharge date was 12/24/21.  Dis[facility].   Discharge diagnosis (Principal Problem): Chest pain, pre-syncope Patient was discharged to Home  Brief summary of hospital course:  Patient was admitted for pre-syncopal episodes described as dizziness, lightheadedness, and blacking out. While orthostattics were negative, patient has several soft blood pressures 90/50s.There was no concern for  infection as patient with no fever, leukocytosis, or other concerning symptoms. His echo revealed normal EF. Hypotension likely secondary to medications. Prior to admission patient was taking imdur 120 mg am and imdur 60 mg pm. He was also taking metop 25 daily. He was monitored on tele with no alarms Follow up with patient about cardioMems cord so that he can continue to monitor volume status He was discharged on the following regimen:  Imdur 30 mg BID (reduced) Metoprolol 12.5 mg daily (reduced) Zio patch placed   Next CCM appt: none  Other upcoming appts: No appointments scheduled within the next 30 days.  Charlene Brooke, PharmD notified and will determine if action is needed.   Chad [name], [place]  [phone]   Pharmacist addendum: Patient has seen [facility], Imdur was stopped. Reduced BP correlates with wt loss on Ozempic. Nothing further needed.  Charlene Brooke, PharmD, BCACP 01/18/22 12:57 PM

## 2021-12-28 ENCOUNTER — Other Ambulatory Visit: Payer: Self-pay | Admitting: Internal Medicine

## 2021-12-28 ENCOUNTER — Telehealth: Payer: Self-pay | Admitting: Nurse Practitioner

## 2021-12-28 DIAGNOSIS — I2510 Atherosclerotic heart disease of native coronary artery without angina pectoris: Secondary | ICD-10-CM | POA: Diagnosis not present

## 2021-12-28 DIAGNOSIS — E782 Mixed hyperlipidemia: Secondary | ICD-10-CM | POA: Diagnosis not present

## 2021-12-28 DIAGNOSIS — R0602 Shortness of breath: Secondary | ICD-10-CM | POA: Diagnosis not present

## 2021-12-28 DIAGNOSIS — I5032 Chronic diastolic (congestive) heart failure: Secondary | ICD-10-CM | POA: Diagnosis not present

## 2021-12-28 MED ORDER — METOCLOPRAMIDE HCL 5 MG PO TABS: 5.0000 mg | ORAL_TABLET | Freq: Three times a day (TID) | ORAL | 0 refills | Status: DC

## 2021-12-28 NOTE — Telephone Encounter (Signed)
Inbound call from patients wife stating patient needs a refill for Reglan. Patient was scheduled for OV on 12/12 at 11:00 with [facility]. Patients wife is requesting refill to be sent. Please advise.

## 2021-12-28 NOTE — Telephone Encounter (Signed)
30 day supply sent to pharmacy.  

## 2022-01-03 ENCOUNTER — Other Ambulatory Visit: Payer: Self-pay | Admitting: Family Medicine

## 2022-01-03 ENCOUNTER — Ambulatory Visit (HOSPITAL_COMMUNITY): Admission: RE | Admit: 2022-01-03 | Payer: Medicare Other | Source: Ambulatory Visit

## 2022-01-04 NOTE — Telephone Encounter (Signed)
Mounjaro Last filled:  12/01/21, #2 mL Last OV:  09/15/21, L ankle pain/swelling Next OV:  none

## 2022-01-05 NOTE — Telephone Encounter (Signed)
Please call patient and schedule appointment as instructed. 

## 2022-01-05 NOTE — Telephone Encounter (Signed)
ERx Please schedule DM f/u visit as due

## 2022-01-12 ENCOUNTER — Ambulatory Visit (INDEPENDENT_AMBULATORY_CARE_PROVIDER_SITE_OTHER): Payer: Medicare Other | Admitting: Family Medicine

## 2022-01-12 ENCOUNTER — Encounter: Payer: Self-pay | Admitting: Family Medicine

## 2022-01-12 VITALS — BP 126/84 | HR 76 | Temp 97.7°F | Ht 70.0 in | Wt 257.0 lb

## 2022-01-12 DIAGNOSIS — I10 Essential (primary) hypertension: Secondary | ICD-10-CM

## 2022-01-12 DIAGNOSIS — I739 Peripheral vascular disease, unspecified: Secondary | ICD-10-CM | POA: Diagnosis not present

## 2022-01-12 DIAGNOSIS — E113559 Type 2 diabetes mellitus with stable proliferative diabetic retinopathy, unspecified eye: Secondary | ICD-10-CM | POA: Diagnosis not present

## 2022-01-12 DIAGNOSIS — K7460 Unspecified cirrhosis of liver: Secondary | ICD-10-CM

## 2022-01-12 DIAGNOSIS — I5032 Chronic diastolic (congestive) heart failure: Secondary | ICD-10-CM | POA: Diagnosis not present

## 2022-01-12 DIAGNOSIS — K7581 Nonalcoholic steatohepatitis (NASH): Secondary | ICD-10-CM

## 2022-01-12 DIAGNOSIS — I6523 Occlusion and stenosis of bilateral carotid arteries: Secondary | ICD-10-CM

## 2022-01-12 DIAGNOSIS — E1142 Type 2 diabetes mellitus with diabetic polyneuropathy: Secondary | ICD-10-CM | POA: Diagnosis not present

## 2022-01-12 NOTE — Progress Notes (Signed)
Patient ID: Chad [name], male    DOB: May 31, 1955, 66 y.o.   MRN: [id number]  This visit was conducted in person.  BP 126/84 (BP Location: Left Arm, Patient Position: Standing, Cuff Size: Normal)   Pulse 76   Temp 97.7 F (36.5 C)   Ht 5' 10"  (1.778 m)   Wt 257 lb (116.6 kg)   SpO2 96%   BMI 36.88 kg/m   No data found.  Supine 118/66 Standing 126/84  CC: DM f/u visit  Subjective:   HPI: Chad [name] is a 66 y.o. male presenting on 01/12/2022 for Diabetes (Pt stated--went to the hospital-still feeling fatigue, light headed.)   [facility] 11/1-04/2021 for chest pain and presyncope due to orthostatic hypotension. Known HFpEF, CAD s/p PCI to mLAD (12/2016). Imdur dose reduced from 120/60 daily to 32m BID. Zio patch placed on discharge (pending results). Baseline weight 246lbs. He saw cardiology Dr SGennette Pacin follow up - Imdur fully discontinued. He continues bumex daily and metolazone PRN (thinks he's out of this). Currently taking bumex 27m2 tab in the morning and 1 at night.   Diminished pulse to right foot. Endorses R calf pain both with walking and at rest. Notes right leg swelling present with increased numbness compared to left.   Continues limiting salt in diet.   Had 2 prednisone courses 08/2021 and 09/2021 for gout flares. He also has colchicine to use PRN.   Cirrhosis - followed by [facility] pending abdominal MR 01/2022.   DM - does regularly check sugars fasting 105 yesterday. Compliant with antihyperglycemic regimen which includes: farxiga 1028maily, mounjaro 7.5mg87mekly. Tolerates mounjaro without abd pain, constipation or nausea. Denies low sugars or hypoglycemic symptoms. Denies paresthesias, blurry vision. Chronic paresthesias to toes. Last diabetic eye exam 2019 - DUE. Glucometer brand: one touch meter. Last foot exam: 08/2020 - DUE. DSME: completed remotely. Lab Results  Component Value Date   HGBA1C 5.6 12/22/2021  [facility]c [facility]2021[facility]   Simple Foot Form Diabetic Foot exam was performed with the following findings: Yes 01/12/2022 12:18 PM  Visual Inspection No deformities, no ulcerations, no other skin breakdown bilaterally: Yes Sensation Testing Intact to touch and monofilament testing bilaterally: Yes Pulse Check See comments: Yes Comments Diminished DP to R leg, 2+ DP on left    Lab Results  Component Value Date   MICROALBUR <0.7 06/18/2021         Relevant past medical, surgical, family and social history reviewed and updated as indicated. Interim medical history since our last visit reviewed. Allergies and medications reviewed and updated. Outpatient Medications Prior to Visit  Medication Sig Dispense Refill   aspirin 81 MG chewable tablet Chew 81 mg by mouth daily.     atorvastatin (LIPITOR) 20 MG tablet TAKE 1 TABLET BY MOUTH ONCE DAILY 90 tablet 2   bumetanide (BUMEX) 2 MG tablet Take 2 mg by mouth daily. Additional  2 mg if gain 3 lbs     citalopram (CELEXA) 20 MG tablet TAKE 1 TABLET BY MOUTH ONCE DAILY 90 tablet 1   colchicine 0.6 MG tablet Take 1 tablet (0.6 mg total) by mouth daily as needed (gout flare). May take 2 tablets on first day of gout flare 30 tablet 0   dapagliflozin propanediol (FARXIGA) 10 MG TABS tablet Take 10 mg by mouth daily before breakfast.     DEXILANT 60 MG capsule TAKE 1 CAPSULE BY MOUTH ONCE DAILY  30 capsule 8   diazepam (VALIUM) 5 MG tablet Take 1 tablet (5 mg total) by mouth every 12 (twelve) hours as needed for anxiety. 30 tablet 1   doxycycline (VIBRA-TABS) 100 MG tablet Take 1 tablet (100 mg total) by mouth 2 (two) times daily. 20 tablet 0   famotidine (PEPCID) 20 MG tablet TAKE 1 TABLET BY MOUTH EVERY NIGHT AT BEDTIME (Patient taking differently: Take 20 mg by mouth daily.) 90 tablet 1   ferrous sulfate 325 (65 FE) MG EC tablet Take 1 tablet (325 mg total) by mouth daily with breakfast.     fluticasone (FLONASE) 50 MCG/ACT nasal spray  Place 2 sprays into both nostrils daily. 16 g 6   levothyroxine (SYNTHROID) 125 MCG tablet TAKE 1 TAB BY MOUTH ONCE DAILY. TAKE ON AN EMPTY STOMACH WITH A GLASS OF WATER ATLEAST 30-60 MINUTES BEFORE BREAKFAST 90 tablet 3   Magnesium 400 MG TABS Take 400 mg by mouth 2 (two) times daily.     methocarbamol (ROBAXIN) 500 MG tablet Take 1 tablet (500 mg total) by mouth 2 (two) times daily as needed for muscle spasms (sedation precautions). 20 tablet 0   metoCLOPramide (REGLAN) 5 MG tablet Take 1 tablet (5 mg total) by mouth 3 (three) times daily before meals. PLEASE keep your December appt for further refills. Thank you 90 tablet 0   metolazone (ZAROXOLYN) 2.5 MG tablet Take 1 tablet (2.5 mg total) by mouth once a week. As needed for fluid/swelling     metoprolol succinate (TOPROL-XL) 25 MG 24 hr tablet TAKE 1 TABLET BY MOUTH EVERY MORNING 30 tablet 5   MOUNJARO 7.5 MG/0.5ML Pen INJECT 7.5 MG INTO THE SKIN ONCE A WEEK 2 mL 1   Multiple Vitamin (MULITIVITAMIN WITH MINERALS) TABS Take 1 tablet by mouth daily.     nitroGLYCERIN (NITROSTAT) 0.4 MG SL tablet Place 1 tablet (0.4 mg total) under the tongue every 5 (five) minutes as needed for chest pain. 60 tablet 3   potassium chloride (KLOR-CON M) 10 MEQ tablet Take 1 tablet (10 mEq total) by mouth 2 (two) times daily. 180 tablet 3   predniSONE (DELTASONE) 20 MG tablet Take two tablets daily for 5 days followed by one tablet daily for 5 days 15 tablet 0   spironolactone (ALDACTONE) 25 MG tablet Take 25 mg by mouth daily.     VENTOLIN HFA 108 (90 Base) MCG/ACT inhaler INHALE 2 PUFFS INTO THE LUNGS EVERY 6 HOURS AS NEEDED FOR WHEEZING OR SHORTNESS OF BREATH 18 g 5   vitamin B-12 (CYANOCOBALAMIN) 500 MCG tablet Take 1 tablet (500 mcg total) by mouth daily.     vitamin E 400 UNIT capsule Take 400 Units by mouth daily.     isosorbide mononitrate (IMDUR) 60 MG 24 hr tablet TAKE 2 TABLETS BY MOUTH DAILY AND 1 TABLET AT BEDTIME 90 tablet 1   No facility-administered  medications prior to visit.     Per HPI unless specifically indicated in ROS section below Review of Systems  Objective:  BP 126/84 (BP Location: Left Arm, Patient Position: Standing, Cuff Size: Normal)   Pulse 76   Temp 97.7 F (36.5 C)   Ht 5' 10"  (1.778 m)   Wt 257 lb (116.6 kg)   SpO2 96%   BMI 36.88 kg/m   Wt Readings from Last 3 Encounters:  01/12/22 257 lb (116.6 kg)  09/15/21 262 lb (118.8 kg)  06/25/21 267 lb (121.1 kg)      Physical Exam Vitals and  nursing note reviewed.  Constitutional:      Appearance: Normal appearance. He is not ill-appearing.  HENT:     Head: Normocephalic and atraumatic.     Mouth/Throat:     Mouth: Mucous membranes are moist.     Pharynx: Oropharynx is clear. No oropharyngeal exudate or posterior oropharyngeal erythema.  Eyes:     Extraocular Movements: Extraocular movements intact.     Conjunctiva/sclera: Conjunctivae normal.     Pupils: Pupils are equal, round, and reactive to light.  Cardiovascular:     Rate and Rhythm: Normal rate and regular rhythm.     Pulses: Normal pulses.     Heart sounds: Normal heart sounds. No murmur heard. Pulmonary:     Effort: Pulmonary effort is normal. No respiratory distress.     Breath sounds: Normal breath sounds. No wheezing, rhonchi or rales.  Musculoskeletal:     Right lower leg: No edema.     Left lower leg: No edema.     Comments: See HPI for foot exam if done  Skin:    General: Skin is warm and dry.     Findings: No rash.  Neurological:     Mental Status: He is alert.  Psychiatric:        Mood and Affect: Mood normal.        Behavior: Behavior normal.       Lab Results  Component Value Date   TSH 3.71 06/18/2021    Lab Results  Component Value Date   CREATININE 1.06 09/15/2021   BUN 17 09/15/2021   NA 137 09/15/2021   K 3.6 09/15/2021   CL 97 09/15/2021   CO2 29 09/15/2021    Lab Results  Component Value Date   WBC 6.2 09/15/2021   HGB 15.8 09/15/2021   HCT 45.8  09/15/2021   MCV 86.7 09/15/2021   PLT 144.0 (L) 09/15/2021    Assessment & Plan:   Problem List Items Addressed This Visit       Unprioritized   Diabetes mellitus type 2 with retinopathy ([place]) - Primary    Reviewed recent A1c at [facility] - 5.6% - continue current regimen including mounjaro 7.26m weekly and farxiga 159mdaily.  Update foot exam today Will request latest eye exam records to update chart.  Ongoing slow weight loss noted.       HTN (hypertension)    Chronic, stable. Orthostatics negative today.       Chronic diastolic CHF (congestive heart failure) ([facility]   [facility], now off imdur due to orthostatic hypotension.      Liver cirrhosis secondary to NASH ([facility]   Appreciate GI care - pending abdominal MRI      Diabetic neuropathy associated with type 2 diabetes mellitus ([facility]  PAD (peripheral artery disease) ([facility]   Describes possible RLE claudication symptoms, along with evidence of diminished pedal pulses to R>L - will update ABIs.       Relevant Orders   VAS US[place]BI WITH/WO TBI     No orders of the defined types were placed in this encounter.  Orders Placed This Encounter  Procedures   Hemoglobin A1c    This external order was created through the Results Console.     Patient Instructions  Orthostatic vital signs checked today.  Schedule eye exam if you're due. We will also [facility].  Continue current medicines.  I'd like to order arterial circulation of your legs -  we will call you to schedule this appointment.  Return in 6 months for wellness visit.   Follow up plan: Return in about 6 months (around 07/13/2022) for medicare wellness visit.  [name], MD

## 2022-01-12 NOTE — Patient Instructions (Addendum)
Orthostatic vital signs checked today.  Schedule eye exam if you're due. We will also request latest eye exam from [facility].  Continue current medicines.  I'd like to order arterial circulation of your legs - we will call you to schedule this appointment.  Return in 6 months for wellness visit.

## 2022-01-16 DIAGNOSIS — I739 Peripheral vascular disease, unspecified: Secondary | ICD-10-CM | POA: Insufficient documentation

## 2022-01-16 DIAGNOSIS — R0989 Other specified symptoms and signs involving the circulatory and respiratory systems: Secondary | ICD-10-CM | POA: Insufficient documentation

## 2022-01-16 NOTE — Assessment & Plan Note (Addendum)
Describes possible RLE claudication symptoms, along with evidence of diminished pedal pulses to R>L - will update ABIs.

## 2022-01-16 NOTE — Assessment & Plan Note (Signed)
[place] [facility] care, now off imdur due to orthostatic hypotension.

## 2022-01-16 NOTE — Assessment & Plan Note (Addendum)
Reviewed recent A1c at [facility] - 5.6% - continue current regimen including mounjaro 7.48m weekly and farxiga 168mdaily.  Update foot exam today Will request latest eye exam records to update chart.  Ongoing slow weight loss noted.

## 2022-01-16 NOTE — Assessment & Plan Note (Signed)
Appreciate GI care - pending abdominal MRI

## 2022-01-16 NOTE — Assessment & Plan Note (Signed)
Chronic, stable. Orthostatics negative today.

## 2022-01-18 DIAGNOSIS — R008 Other abnormalities of heart beat: Secondary | ICD-10-CM | POA: Diagnosis not present

## 2022-01-20 DIAGNOSIS — R55 Syncope and collapse: Secondary | ICD-10-CM | POA: Diagnosis not present

## 2022-01-21 ENCOUNTER — Encounter: Payer: Self-pay | Admitting: Family Medicine

## 2022-01-25 ENCOUNTER — Other Ambulatory Visit: Payer: Self-pay | Admitting: Internal Medicine

## 2022-01-25 ENCOUNTER — Other Ambulatory Visit: Payer: Self-pay | Admitting: Family Medicine

## 2022-01-25 DIAGNOSIS — K219 Gastro-esophageal reflux disease without esophagitis: Secondary | ICD-10-CM

## 2022-01-31 ENCOUNTER — Ambulatory Visit (HOSPITAL_COMMUNITY)
Admission: RE | Admit: 2022-01-31 | Discharge: 2022-01-31 | Disposition: A | Discharge status: Home / Self Care | Payer: Medicare Other | Source: Ambulatory Visit | Attending: Internal Medicine | Admitting: Internal Medicine

## 2022-01-31 DIAGNOSIS — K7581 Nonalcoholic steatohepatitis (NASH): Secondary | ICD-10-CM | POA: Diagnosis not present

## 2022-01-31 DIAGNOSIS — N1330 Unspecified hydronephrosis: Secondary | ICD-10-CM | POA: Diagnosis not present

## 2022-01-31 DIAGNOSIS — K3189 Other diseases of stomach and duodenum: Secondary | ICD-10-CM | POA: Diagnosis not present

## 2022-01-31 DIAGNOSIS — R161 Splenomegaly, not elsewhere classified: Secondary | ICD-10-CM | POA: Diagnosis not present

## 2022-01-31 DIAGNOSIS — K7460 Unspecified cirrhosis of liver: Secondary | ICD-10-CM | POA: Insufficient documentation

## 2022-01-31 MED ORDER — GADOBUTROL 1 MMOL/ML IV SOLN
10.0000 mL | Freq: Once | INTRAVENOUS | Status: AC | PRN
  Administered 2022-01-31: 10 mL via INTRAVENOUS

## 2022-02-01 ENCOUNTER — Encounter: Payer: Self-pay | Admitting: Nurse Practitioner

## 2022-02-01 ENCOUNTER — Ambulatory Visit (INDEPENDENT_AMBULATORY_CARE_PROVIDER_SITE_OTHER): Payer: Medicare Other | Admitting: Nurse Practitioner

## 2022-02-01 ENCOUNTER — Other Ambulatory Visit (INDEPENDENT_AMBULATORY_CARE_PROVIDER_SITE_OTHER): Payer: Medicare Other

## 2022-02-01 VITALS — BP 110/66 | HR 71 | Ht 69.0 in | Wt 261.4 lb

## 2022-02-01 DIAGNOSIS — K7581 Nonalcoholic steatohepatitis (NASH): Secondary | ICD-10-CM | POA: Diagnosis not present

## 2022-02-01 DIAGNOSIS — K7460 Unspecified cirrhosis of liver: Secondary | ICD-10-CM

## 2022-02-01 DIAGNOSIS — E1143 Type 2 diabetes mellitus with diabetic autonomic (poly)neuropathy: Secondary | ICD-10-CM | POA: Diagnosis not present

## 2022-02-01 DIAGNOSIS — K3184 Gastroparesis: Secondary | ICD-10-CM | POA: Diagnosis not present

## 2022-02-01 LAB — CBC
HCT: 50.5 % (ref 39.0–52.0)
Hemoglobin: 17.3 g/dL — ABNORMAL HIGH (ref 13.0–17.0)
MCHC: 34.3 g/dL (ref 30.0–36.0)
MCV: 87.1 fl (ref 78.0–100.0)
Platelets: 177 10*3/uL (ref 150.0–400.0)
RBC: 5.79 Mil/uL (ref 4.22–5.81)
RDW: 13.1 % (ref 11.5–15.5)
WBC: 7 10*3/uL (ref 4.0–10.5)

## 2022-02-01 LAB — COMPREHENSIVE METABOLIC PANEL
ALT: 31 U/L (ref 0–53)
AST: 36 U/L (ref 0–37)
Albumin: 4.6 g/dL (ref 3.5–5.2)
Alkaline Phosphatase: 171 U/L — ABNORMAL HIGH (ref 39–117)
BUN: 13 mg/dL (ref 6–23)
CO2: 29 mEq/L (ref 19–32)
Calcium: 9.3 mg/dL (ref 8.4–10.5)
Chloride: 98 mEq/L (ref 96–112)
Creatinine, Ser: 1.17 mg/dL (ref 0.40–1.50)
GFR: 64.76 mL/min (ref >60.00)
Glucose, Bld: 96 mg/dL (ref 70–99)
Potassium: 3.9 mEq/L (ref 3.5–5.1)
Sodium: 137 mEq/L (ref 135–145)
Total Bilirubin: 1.4 mg/dL — ABNORMAL HIGH (ref 0.2–1.2)
Total Protein: 7 g/dL (ref 6.0–8.3)

## 2022-02-01 LAB — PROTIME-INR
INR: 1.1 ratio — ABNORMAL HIGH (ref 0.8–1.0)
Prothrombin Time: 12.1 s (ref 9.6–13.1)

## 2022-02-01 MED ORDER — METOCLOPRAMIDE HCL 5 MG PO TABS: ORAL_TABLET | ORAL | 2 refills | Status: DC

## 2022-02-01 NOTE — Progress Notes (Unsigned)
02/01/2022 PAULANTHONY GLEAVES [id number] 1955/05/14   Chief Complaint:  History of Present Illness: Chad [name] is a 66 year old male with a past medical history of CAD s/p stent placement 2018 on ASA, CHF, RBBB, DM II, diabetic gastroparesis, colon polyps and NASH cirrhosis with portal hypertension and splenomegaly.  Past cholecystectomy and gastric stapling surgery 1980's. He is followed by Dr. Hilarie Fredrickson.    He was admitted to the hospital 11/2021 in [place]  BP was real low.   Dr. Sallyanne Havers, last seen end of Oct.   He denies having any N/V or abdominal pain. No heartburn of dysphagia.   Normal BM, infrquent diarrhea       Latest Ref Rng & Units 09/15/2021    2:56 PM 06/18/2021    8:27 AM 11/26/2020    9:17 AM  CBC  WBC 4.0 - 10.5 K/uL 6.2  4.7  5.2   Hemoglobin 13.0 - 17.0 g/dL 15.8  15.4  16.0   Hematocrit 39.0 - 52.0 % 45.8  45.5  47.2   Platelets 150.0 - 400.0 K/uL 144.0  121.0  145.0     .cmet   Current Medications, Allergies, Past Medical History, Past Surgical History, Family History and Social History were reviewed in [employer] record.   Review of Systems:   Constitutional: Negative for fever, sweats, chills or weight loss.  Respiratory: Negative for shortness of breath.   Cardiovascular: Negative for chest pain, palpitations and leg swelling.  Gastrointestinal: See HPI.  Musculoskeletal: Negative for back pain or muscle aches.  Neurological: Negative for dizziness, headaches or paresthesias.    Physical Exam: BP 110/66 (BP Location: Left Arm, Patient Position: Sitting)   Pulse 71   Ht _0  (1.753 m)   Wt 261 lb 6.4 oz (118.6 kg)   SpO2 97%   BMI 38.60 kg/m  General: Well developed, w   ***male in no acute distress. Head: Normocephalic and atraumatic. Eyes: No scleral icterus. Conjunctiva pink . Ears: Normal auditory acuity. Mouth: Dentition intact. No ulcers or lesions.  Lungs: Clear throughout to  auscultation. Heart: Regular rate and rhythm, no murmur. Abdomen: Soft, nontender and nondistended. No masses or hepatomegaly. Normal bowel sounds x 4 quadrants.  Rectal: *** Musculoskeletal: Symmetrical with no gross deformities. Extremities: No edema. Neurological: Alert oriented x 4. No focal deficits.  Psychological: Alert and cooperative. Normal mood and affect  Assessment and Recommendations: ***  76) 66 year old male with NAFLD cirrhosis with associated portal hypertension and splenomegaly. Central to lower abdominal pain x 4 months. No ascites. No overt hepatic encephalopathy. Elevated Alk phos, T. Bili, ALT and GGT levels. Normal AMA level.  -CBC, BMP, hepatic panel, PT/INR, AFP. Calculate MELD score after lab results completed.  -CTAP with oral and IV contrast due to central and lower abdominal pain, await BUN/Cr/GFR level prior to scheduling -Eventual surveillance abdominal MRI w/wo contrast (he was due for a surveillance abdominal MRI 10/2020) -2gm low sodium diet    2) Diarrhea secondary to Miralax, low suspicion for infectious etiology but will check a GI pathogen panel  -GI pathogen panel    3) Constipation  -Hold Miralax    4) GERD. Reported black colored stools while on oral iron which persisted despite off iron x 3 weeks. Rectal exam today showed brown stool guaiac negative.  -Heme slides, patient to complete on days he sees a black colored stool -CBC  -Continue Dexilant -May increase Famotidine 59m Q HS  5) Thrombocytopenia secondary to cirrhosis and splenomegaly    6) History of gastroparesis with nausea without vomiting x 3 to 4 months. His past gastric stapling may be contributing to his UGI symptoms.  -Advised 3 to 4 small snack sized meals -CTAP as noted above  -? Restart Reglan   7) History of colon polyps -Next colonoscopy due 01/2025   8) CAD s/p Des in 2018 on ASA, Lipitor, Metoprolol and Imdur. No longer on Plavix. Chronic DOE without CP.

## 2022-02-01 NOTE — Patient Instructions (Signed)
Your provider has requested that you go to the basement level for lab work before leaving today. Press "B" on the elevator. The lab is located at the first door on the left as you exit the elevator.   We have sent the following medications to your pharmacy for you to pick up at your convenience: Reglan 5 mg  Follow up in 6 months.  Due to recent changes in healthcare laws, you may see the results of your imaging and laboratory studies on MyChart before your provider has had a chance to review them.  We understand that in some cases there may be results that are confusing or concerning to you. Not all laboratory results come back in the same time frame and the provider may be waiting for multiple results in order to interpret others.  Please give [place] 48 hours in order for your provider to thoroughly review all the results before contacting the office for clarification of your results.    Thank you for trusting me with your gastrointestinal care!   Carl Best, CRNP

## 2022-02-02 NOTE — Progress Notes (Signed)
Addendum: Reviewed and agree with assessment and management plan. Variceal screening upper endoscopy would be recommended unless Dr. Danise Mina is okay with changing the patient's metoprolol to nadolol.  This change would provide prophylaxis for variceal bleed and negate the need for upper endoscopy every 2 years.  I have copied Dr. Danise Mina on this note for his thoughts. Vaughan Basta, when I recently commented on the patient's [facility] screening MRI I did not realize he would be seeing Jaclyn Shaggy therefore I do not need to see him in February as I previously thought. He can follow-up in 6 months Thanks all Stuti Sandin, Lajuan Lines, MD

## 2022-02-03 LAB — AFP TUMOR MARKER: AFP-Tumor Marker: 4.8 ng/mL (ref <6.1)

## 2022-02-04 NOTE — Progress Notes (Signed)
Spoke with pt and he is aware that he does not need to see Dr. Hilarie Fredrickson now for 6 mth. Appt cancelled and recall entered in epic for 63mh OV.

## 2022-02-15 ENCOUNTER — Telehealth: Payer: Self-pay | Admitting: Family Medicine

## 2022-02-15 NOTE — Telephone Encounter (Signed)
Can we check on ABIs ordered 12/2021? Pt has still not been contacted to get this scheduled. Thank you.

## 2022-02-16 NOTE — Telephone Encounter (Signed)
Spoke with Tiburcio Bash with [facility] - they are able to scan this patient and they will contact him to set up the appt for ABI

## 2022-02-16 NOTE — Telephone Encounter (Signed)
Sent message to [facility] to see if able to perform this [place] or if it needed to be sent elsewhere.   Waiting on a response.

## 2022-02-18 ENCOUNTER — Encounter: Payer: Self-pay | Admitting: Family Medicine

## 2022-02-18 ENCOUNTER — Ambulatory Visit (INDEPENDENT_AMBULATORY_CARE_PROVIDER_SITE_OTHER): Payer: Medicare Other | Admitting: Family Medicine

## 2022-02-18 VITALS — BP 118/80 | HR 72 | Temp 98.0°F | Ht 69.0 in | Wt 254.0 lb

## 2022-02-18 DIAGNOSIS — J0100 Acute maxillary sinusitis, unspecified: Secondary | ICD-10-CM | POA: Diagnosis not present

## 2022-02-18 DIAGNOSIS — R051 Acute cough: Secondary | ICD-10-CM | POA: Diagnosis not present

## 2022-02-18 DIAGNOSIS — I6523 Occlusion and stenosis of bilateral carotid arteries: Secondary | ICD-10-CM

## 2022-02-18 LAB — POCT INFLUENZA A/B
Influenza A, POC: NEGATIVE
Influenza B, POC: NEGATIVE

## 2022-02-18 LAB — POC COVID19 BINAXNOW: SARS Coronavirus 2 Ag: NEGATIVE

## 2022-02-18 MED ORDER — BUMETANIDE 1 MG PO TABS: 3.0000 mg | ORAL_TABLET | Freq: Every day | ORAL | Status: DC

## 2022-02-18 MED ORDER — PREDNISONE 10 MG PO TABS: ORAL_TABLET | ORAL | 0 refills | Status: DC

## 2022-02-18 MED ORDER — AMOXICILLIN-POT CLAVULANATE 875-125 MG PO TABS: 1.0000 | ORAL_TABLET | Freq: Two times a day (BID) | ORAL | 0 refills | Status: DC

## 2022-02-18 NOTE — Progress Notes (Signed)
Cough. Started about 4 days ago.  Discolored sputum, yellow brown.  SOB but weight isn't up.  Still taking bumex 3mg  per day at baseline.  Weight 248 this AM and that is down from prev baseline of 252.  Dec appetite in the meantime.  Voice is hoarse.  Fatigued. No one else is sick at home but had likely exposures over the holidays.    Episodic sharp L upper arm pain.  Icy hot helps.    Meds, vitals, and allergies reviewed.   ROS: Per HPI unless specifically indicated in ROS section   Nad Ncat Chronic TM changes.  MMM OP WNL Nasal exam stuffy Max sinuses ttp B, frontal sinuses not ttp. Normal neck ROM and shoulder/elbow ROM Rrr Ctab No BLE edema  Covid and flu neg, d/w pt at OV.

## 2022-02-18 NOTE — Patient Instructions (Addendum)
Start augmentin, take prednisone with food.  Rest and fluids.  If lightheaded, decrease bumex to 64m a day. When better, restart 311ma day.  Take care.  Glad to see you.

## 2022-02-20 DIAGNOSIS — J019 Acute sinusitis, unspecified: Secondary | ICD-10-CM | POA: Insufficient documentation

## 2022-02-20 DIAGNOSIS — J0100 Acute maxillary sinusitis, unspecified: Secondary | ICD-10-CM | POA: Insufficient documentation

## 2022-02-20 NOTE — Assessment & Plan Note (Addendum)
Routine cautions given to patient. Start augmentin, take prednisone with food.  Steroid cautions discussed. Rest and fluids.  Discussed his fluid status.  If lightheaded, decrease bumex to 41m a day. When better, restart 356ma day.  Follow-up as needed.  He agrees to plan.  Okay for outpatient follow-up.

## 2022-02-23 ENCOUNTER — Other Ambulatory Visit: Payer: Self-pay | Admitting: Family Medicine

## 2022-02-25 NOTE — Telephone Encounter (Signed)
Mounjaro Last filled: 01/31/22, #2 mL Last OV: 01/12/22, DM f/u Next OV: 07/25/22, CPE

## 2022-02-28 ENCOUNTER — Ambulatory Visit (INDEPENDENT_AMBULATORY_CARE_PROVIDER_SITE_OTHER): Payer: Medicare Other

## 2022-02-28 DIAGNOSIS — I739 Peripheral vascular disease, unspecified: Secondary | ICD-10-CM

## 2022-02-28 NOTE — Progress Notes (Signed)
[name], I did not hear back from Dr. Danise Mina, thank you for the continuity here.  Chad [name], could you change patient from metoprolol to carvedilol or nadolol given the nonselective nature of this to provide esophageal variceal bleeding prophylaxis? If you are not comfortable with this change we can have him see cardiology Thank you  JMP

## 2022-02-28 NOTE — Progress Notes (Signed)
Dr. Hilarie Fredrickson, have you heard back from his PCP regarding switching him to a nonselective beta-blocker?  If not, do want the patient to contact his cardiologist to further discuss your recommendations as noted below?

## 2022-03-03 ENCOUNTER — Encounter: Payer: Self-pay | Admitting: Family Medicine

## 2022-03-14 ENCOUNTER — Ambulatory Visit (INDEPENDENT_AMBULATORY_CARE_PROVIDER_SITE_OTHER): Payer: Medicare Other

## 2022-03-14 VITALS — Ht 69.0 in | Wt 254.0 lb

## 2022-03-14 DIAGNOSIS — Z0000 Encounter for general adult medical examination without abnormal findings: Secondary | ICD-10-CM | POA: Diagnosis not present

## 2022-03-14 NOTE — Patient Instructions (Addendum)
Chad [name] , Thank you for taking time to come for your Medicare Wellness Visit. I appreciate your ongoing commitment to your health goals. Please review the following plan we discussed and let me know if I can assist you in the future.   These are the goals we discussed:  Goals Addressed               This Visit's Progress     Patient Stated     Maintain healthy lifestyle (pt-stated)        Stay active Stay hydrated Manage weight         This is a list of the screening recommended for you and due dates:  Health Maintenance  Topic Date Due   Eye exam for diabetics  12/19/2020   DTaP/Tdap/Td vaccine (2 - Tdap) 01/30/2021   Stool Blood Test  02/16/2021   COVID-19 Vaccine (4 - 2023-24 season) 03/30/2022*   Flu Shot  05/22/2022*   Yearly kidney health urinalysis for diabetes  06/19/2022   Hemoglobin A1C  06/22/2022   Complete foot exam   01/13/2023   Yearly kidney function blood test for diabetes  02/02/2023   Medicare Annual Wellness Visit  03/15/2023   Colon Cancer Screening  02/16/2025   Pneumonia Vaccine (3 - PPSV23 or PCV20) 03/18/2025   Hepatitis C Screening: USPSTF Recommendation to screen - Ages 66-79 yo.  Completed   Zoster (Shingles) Vaccine  Completed   HPV Vaccine  Aged Out  *Topic was postponed. The date shown is not the original due date.    Advanced directives: not yet completed  Conditions/risks identified: none new  Next appointment: Follow up in one year for your annual wellness visit.   Preventive Care 9 Years and Older, Male  Preventive care refers to lifestyle choices and visits with your health care provider that can promote health and wellness. What does preventive care include? A yearly physical exam. This is also called an annual well check. Dental exams once or twice a year. Routine eye exams. Ask your health care provider how often you should have your eyes checked. Personal lifestyle choices, including: Daily care of your teeth and  gums. Regular physical activity. Eating a healthy diet. Avoiding tobacco and drug use. Limiting alcohol use. Practicing safe sex. Taking low doses of aspirin every day. Taking vitamin and mineral supplements as recommended by your health care provider. What happens during an annual well check? The services and screenings done by your health care provider during your annual well check will depend on your age, overall health, lifestyle risk factors, and family history of disease. Counseling  Your health care provider may ask you questions about your: Alcohol use. Tobacco use. Drug use. Emotional well-being. Home and relationship well-being. Sexual activity. Eating habits. History of falls. Memory and ability to understand (cognition). Work and work Statistician. Screening  You may have the following tests or measurements: Height, weight, and BMI. Blood pressure. Lipid and cholesterol levels. These may be checked every 5 years, or more frequently if you are over 10 years old. Skin check. Lung cancer screening. You may have this screening every year starting at age 74 if you have a 30-pack-year history of smoking and currently smoke or have quit within the past 15 years. Fecal occult blood test (FOBT) of the stool. You may have this test every year starting at age 24. Flexible sigmoidoscopy or colonoscopy. You may have a sigmoidoscopy every 5 years or a colonoscopy every 10 years starting at age  50. Prostate cancer screening. Recommendations will vary depending on your family history and other risks. Hepatitis C blood test. Hepatitis B blood test. Sexually transmitted disease (STD) testing. Diabetes screening. This is done by checking your blood sugar (glucose) after you have not eaten for a while (fasting). You may have this done every 1-3 years. Abdominal aortic aneurysm (AAA) screening. You may need this if you are a current or former smoker. Osteoporosis. You may be screened  starting at age 14 if you are at high risk. Talk with your health care provider about your test results, treatment options, and if necessary, the need for more tests. Vaccines  Your health care provider may recommend certain vaccines, such as: Influenza vaccine. This is recommended every year. Tetanus, diphtheria, and acellular pertussis (Tdap, Td) vaccine. You may need a Td booster every 10 years. Zoster vaccine. You may need this after age 20. Pneumococcal 13-valent conjugate (PCV13) vaccine. One dose is recommended after age 52. Pneumococcal polysaccharide (PPSV23) vaccine. One dose is recommended after age 37. Talk to your health care provider about which screenings and vaccines you need and how often you need them. This information is not intended to replace advice given to you by your health care provider. Make sure you discuss any questions you have with your health care provider. Document Released: 03/06/2015 Document Revised: 10/28/2015 Document Reviewed: 12/09/2014 Elsevier Interactive Patient Education  2017 [place] Prevention in the Home Falls can cause injuries. They can happen to people of all ages. There are many things you can do to make your home safe and to help prevent falls. What can I do on the outside of my home? Regularly fix the edges of walkways and driveways and fix any cracks. Remove anything that might make you trip as you walk through a door, such as a raised step or threshold. Trim any bushes or trees on the path to your home. Use bright outdoor lighting. Clear any walking paths of anything that might make someone trip, such as rocks or tools. Regularly check to see if handrails are loose or broken. Make sure that both sides of any steps have handrails. Any raised decks and porches should have guardrails on the edges. Have any leaves, snow, or ice cleared regularly. Use sand or salt on walking paths during winter. Clean up any spills in your garage  right away. This includes oil or grease spills. What can I do in the bathroom? Use night lights. Install grab bars by the toilet and in the tub and shower. Do not use towel bars as grab bars. Use non-skid mats or decals in the tub or shower. If you need to sit down in the shower, use a plastic, non-slip stool. Keep the floor dry. Clean up any water that spills on the floor as soon as it happens. Remove soap buildup in the tub or shower regularly. Attach bath mats securely with double-sided non-slip rug tape. Do not have throw rugs and other things on the floor that can make you trip. What can I do in the bedroom? Use night lights. Make sure that you have a light by your bed that is easy to reach. Do not use any sheets or blankets that are too big for your bed. They should not hang down onto the floor. Have a firm chair that has side arms. You can use this for support while you get dressed. Do not have throw rugs and other things on the floor that can make you  trip. What can I do in the kitchen? Clean up any spills right away. Avoid walking on wet floors. Keep items that you use a lot in easy-to-reach places. If you need to reach something above you, use a strong step stool that has a grab bar. Keep electrical cords out of the way. Do not use floor polish or wax that makes floors slippery. If you must use wax, use non-skid floor wax. Do not have throw rugs and other things on the floor that can make you trip. What can I do with my stairs? Do not leave any items on the stairs. Make sure that there are handrails on both sides of the stairs and use them. Fix handrails that are broken or loose. Make sure that handrails are as long as the stairways. Check any carpeting to make sure that it is firmly attached to the stairs. Fix any carpet that is loose or worn. Avoid having throw rugs at the top or bottom of the stairs. If you do have throw rugs, attach them to the floor with carpet tape. Make  sure that you have a light switch at the top of the stairs and the bottom of the stairs. If you do not have them, ask someone to add them for you. What else can I do to help prevent falls? Wear shoes that: Do not have high heels. Have rubber bottoms. Are comfortable and fit you well. Are closed at the toe. Do not wear sandals. If you use a stepladder: Make sure that it is fully opened. Do not climb a closed stepladder. Make sure that both sides of the stepladder are locked into place. Ask someone to hold it for you, if possible. Clearly mark and make sure that you can see: Any grab bars or handrails. First and last steps. Where the edge of each step is. Use tools that help you move around (mobility aids) if they are needed. These include: Canes. Walkers. Scooters. Crutches. Turn on the lights when you go into a dark area. Replace any light bulbs as soon as they burn out. Set up your furniture so you have a clear path. Avoid moving your furniture around. If any of your floors are uneven, fix them. If there are any pets around you, be aware of where they are. Review your medicines with your doctor. Some medicines can make you feel dizzy. This can increase your chance of falling. Ask your doctor what other things that you can do to help prevent falls. This information is not intended to replace advice given to you by your health care provider. Make sure you discuss any questions you have with your health care provider. Document Released: 12/04/2008 Document Revised: 07/16/2015 Document Reviewed: 03/14/2014 Elsevier Interactive Patient Education  2017 [employer].
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
## 2022-04-05 ENCOUNTER — Telehealth: Payer: Self-pay | Admitting: *Deleted

## 2022-04-05 MED ORDER — SEMAGLUTIDE (1 MG/DOSE) 4 MG/3ML ~~LOC~~ SOPN
1.0000 mg | PEN_INJECTOR | SUBCUTANEOUS | 5 refills | Status: DC
  Filled 2022-05-21: qty 3, 28d supply, fill #0
  Filled 2022-06-13: qty 3, 28d supply, fill #1
  Filled 2022-07-11: qty 3, 28d supply, fill #2
  Filled 2022-08-08: qty 3, 28d supply, fill #3

## 2022-04-05 NOTE — Telephone Encounter (Signed)
Plz notify I've sent ozempic 52m weekly to take in place of mounjaro.  Keep checking to see when mounjaro will be available again.

## 2022-04-05 NOTE — Telephone Encounter (Signed)
Spoke to patient's wife by telephone and was advised that they are not able to find [facility]. [name] stated that he has only one week left of the Mounjaro 7.5 mg and wants to know if this can be switched to something else. [name] stated that she has had the same problem and can not find it anywhere at this point. Pharmacy [place].

## 2022-04-06 NOTE — Telephone Encounter (Signed)
Patient notified as instructed by telephone and verbalized understanding. 

## 2022-04-19 ENCOUNTER — Ambulatory Visit: Payer: Medicare Other | Admitting: Internal Medicine

## 2022-04-25 ENCOUNTER — Encounter: Payer: Self-pay | Admitting: Internal Medicine

## 2022-04-26 DIAGNOSIS — I5032 Chronic diastolic (congestive) heart failure: Secondary | ICD-10-CM | POA: Diagnosis not present

## 2022-04-26 DIAGNOSIS — I2510 Atherosclerotic heart disease of native coronary artery without angina pectoris: Secondary | ICD-10-CM | POA: Diagnosis not present

## 2022-04-26 DIAGNOSIS — I10 Essential (primary) hypertension: Secondary | ICD-10-CM | POA: Diagnosis not present

## 2022-05-03 ENCOUNTER — Other Ambulatory Visit: Payer: Self-pay | Admitting: Family Medicine

## 2022-05-03 ENCOUNTER — Other Ambulatory Visit: Payer: Self-pay | Admitting: Nurse Practitioner

## 2022-05-03 DIAGNOSIS — Z9884 Bariatric surgery status: Secondary | ICD-10-CM | POA: Diagnosis not present

## 2022-05-03 DIAGNOSIS — F418 Other specified anxiety disorders: Secondary | ICD-10-CM

## 2022-05-03 DIAGNOSIS — I2510 Atherosclerotic heart disease of native coronary artery without angina pectoris: Secondary | ICD-10-CM | POA: Diagnosis not present

## 2022-05-03 DIAGNOSIS — N62 Hypertrophy of breast: Secondary | ICD-10-CM | POA: Diagnosis not present

## 2022-05-03 DIAGNOSIS — R918 Other nonspecific abnormal finding of lung field: Secondary | ICD-10-CM | POA: Diagnosis not present

## 2022-05-03 DIAGNOSIS — Z9049 Acquired absence of other specified parts of digestive tract: Secondary | ICD-10-CM | POA: Diagnosis not present

## 2022-05-03 DIAGNOSIS — I700 Atherosclerosis of aorta: Secondary | ICD-10-CM | POA: Diagnosis not present

## 2022-05-03 DIAGNOSIS — R161 Splenomegaly, not elsewhere classified: Secondary | ICD-10-CM | POA: Diagnosis not present

## 2022-05-03 DIAGNOSIS — K7460 Unspecified cirrhosis of liver: Secondary | ICD-10-CM | POA: Diagnosis not present

## 2022-05-03 DIAGNOSIS — E039 Hypothyroidism, unspecified: Secondary | ICD-10-CM

## 2022-05-03 DIAGNOSIS — R079 Chest pain, unspecified: Secondary | ICD-10-CM | POA: Diagnosis not present

## 2022-05-10 ENCOUNTER — Other Ambulatory Visit: Payer: Self-pay | Admitting: Family Medicine

## 2022-05-10 DIAGNOSIS — E785 Hyperlipidemia, unspecified: Secondary | ICD-10-CM

## 2022-05-19 ENCOUNTER — Ambulatory Visit (INDEPENDENT_AMBULATORY_CARE_PROVIDER_SITE_OTHER): Payer: Medicare Other | Admitting: Dermatology

## 2022-05-19 VITALS — BP 129/72 | HR 68

## 2022-05-19 DIAGNOSIS — L814 Other melanin hyperpigmentation: Secondary | ICD-10-CM

## 2022-05-19 DIAGNOSIS — L578 Other skin changes due to chronic exposure to nonionizing radiation: Secondary | ICD-10-CM | POA: Diagnosis not present

## 2022-05-19 DIAGNOSIS — D1801 Hemangioma of skin and subcutaneous tissue: Secondary | ICD-10-CM

## 2022-05-19 DIAGNOSIS — Z1283 Encounter for screening for malignant neoplasm of skin: Secondary | ICD-10-CM | POA: Diagnosis not present

## 2022-05-19 DIAGNOSIS — L821 Other seborrheic keratosis: Secondary | ICD-10-CM

## 2022-05-19 DIAGNOSIS — D692 Other nonthrombocytopenic purpura: Secondary | ICD-10-CM

## 2022-05-19 DIAGNOSIS — L820 Inflamed seborrheic keratosis: Secondary | ICD-10-CM

## 2022-05-19 DIAGNOSIS — L570 Actinic keratosis: Secondary | ICD-10-CM | POA: Diagnosis not present

## 2022-05-19 DIAGNOSIS — H61002 Unspecified perichondritis of left external ear: Secondary | ICD-10-CM

## 2022-05-19 DIAGNOSIS — Z79899 Other long term (current) drug therapy: Secondary | ICD-10-CM

## 2022-05-19 DIAGNOSIS — D229 Melanocytic nevi, unspecified: Secondary | ICD-10-CM

## 2022-05-19 MED ORDER — MOMETASONE FUROATE 0.1 % EX CREA: TOPICAL_CREAM | CUTANEOUS | 0 refills | Status: DC

## 2022-05-19 NOTE — Progress Notes (Signed)
Follow-Up Visit   Subjective  Chad [name] is a 67 y.o. male who presents for the following: Skin Cancer Screening and Full Body Skin Exam  The patient presents for Upper Body Skin Exam (UBSE) for skin cancer screening and mole check. The patient has spots, moles and lesions to be evaluated, some may be new or changing and the patient has concerns that these could be cancer.    The following portions of the chart were reviewed this encounter and updated as appropriate: medications, allergies, medical history  Review of Systems:  No other skin or systemic complaints except as noted in HPI or Assessment and Plan.  Objective  Well appearing patient in no apparent distress; mood and affect are within normal limits.  All skin waist up examined. Relevant physical exam findings are noted in the Assessment and Plan.    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  Trauma or pressure from sleeping on the ear or from cell phone use and sun damage may be exacerbating factors.  Treatment may include using a C-shaped airplane neck pillow for sleeping on the side of the head so no pressure is on the ear.  Other treatments include topical or intralesional steroids; liquid nitrogen or laser destruction; shave removal or excision.  The condition can be difficult to treat and persist or recur despite treatment.  Start Mometasone 0.1% cream to aa QD-BID PRN up to 5d/wk.   Destruction of lesion - L ear helix Complexity: simple   Destruction method: cryotherapy   Informed consent: discussed and consent obtained   Timeout:  patient name, date of birth, surgical site, and procedure verified Lesion destroyed using liquid nitrogen: Yes   Region frozen until ice ball  extended beyond lesion: Yes   Outcome: patient tolerated procedure well with no complications   Post-procedure details: wound care instructions given    mometasone (ELOCON) 0.1 % cream - L ear helix Apply to tender area on left ear QD-BID PRN up to 5d/wk   Lentigines, Seborrheic Keratoses, Hemangiomas - Benign normal skin lesions - Benign-appearing - Call for any changes  Melanocytic Nevi - Tan-brown and/or pink-flesh-colored symmetric macules and papules - Benign appearing on exam today - Observation - Call clinic for new or changing moles - Recommend daily use of broad spectrum spf 30+ sunscreen to sun-exposed areas.   Actinic Damage - Chronic condition, secondary to cumulative UV/sun exposure - diffuse scaly erythematous macules with underlying dyspigmentation - Recommend daily broad spectrum sunscreen SPF 30+ to sun-exposed areas, reapply every 2 hours as needed.  - Staying in the shade or wearing long sleeves, sun glasses (UVA+UVB protection) and wide brim hats (4-inch brim around the entire circumference of the hat) are also recommended for sun protection.  - Call for new or changing lesions.  INFLAMED SEBORRHEIC KERATOSIS Exam: Erythematous keratotic or waxy stuck-on papule or plaque.  Symptomatic, irritating, patient would like treated.  Benign-appearing.  Call clinic for new or changing lesions.   Prior to procedure, discussed risks of blister formation, small wound, skin dyspigmentation, or rare scar following treatment. Recommend Vaseline ointment to treated areas while healing.  Destruction Procedure Note Destruction method: cryotherapy   Informed consent: discussed and consent obtained   Lesion destroyed using liquid nitrogen: Yes   Outcome: patient  tolerated procedure well with no complications   Post-procedure details: wound care instructions given   Locations: L low back x 1 , R chest x 1, R axilla x 1, arms x 18 # of Lesions Treated: 21  ACTINIC KERATOSIS  with CNCH on the L ear helix Exam: Erythematous thin papules/macules with gritty scale  Actinic keratoses are precancerous spots that appear secondary to cumulative UV radiation exposure/sun exposure over time. They are chronic with expected duration over 1 year. A portion of actinic keratoses will progress to squamous cell carcinoma of the skin. It is not possible to reliably predict which spots will progress to skin cancer and so treatment is recommended to prevent development of skin cancer.  Recommend daily broad spectrum sunscreen SPF 30+ to sun-exposed areas, reapply every 2 hours as needed.  Recommend staying in the shade or wearing long sleeves, sun glasses (UVA+UVB protection) and wide brim hats (4-inch brim around the entire circumference of the hat). Call for new or changing lesions.  Treatment Plan:  Prior to procedure, discussed risks of blister formation, small wound, skin dyspigmentation, or rare scar following cryotherapy. Recommend Vaseline ointment to treated areas while healing.  Destruction Procedure Note Destruction method: cryotherapy   Informed consent: discussed and consent obtained   Lesion destroyed using liquid nitrogen: Yes   Outcome: patient tolerated procedure well with no complications   Post-procedure details: wound care instructions given   Locations: L ear helix x 1 # of Lesions Treated: 1  Purpura - Chronic; persistent and recurrent.  Treatable, but not curable. - Violaceous macules and patches - Benign - Related to trauma, age, sun damage and/or use of blood thinners, chronic use of topical and/or oral steroids - Observe - Can use OTC arnica containing moisturizer such as Dermend Bruise Formula if desired - Call for worsening or other concerns  Skin cancer screening performed today.  Return in about 1 year (around 05/19/2023) for TBSE.  Luther Redo, CMA, am acting as scribe for Sarina Ser, MD .   Documentation: I have reviewed the above  documentation for accuracy and completeness, and I agree with the above.  Sarina Ser, MD

## 2022-05-19 NOTE — Patient Instructions (Signed)
Due to recent changes in healthcare laws, you may see results of your pathology and/or laboratory studies on MyChart before the doctors have had a chance to review them. We understand that in some cases there may be results that are confusing or concerning to you. Please understand that not all results are received at the same time and often the doctors may need to interpret multiple results in order to provide you with the best plan of care or course of treatment. Therefore, we ask that you please give us 2 business days to thoroughly review all your results before contacting the office for clarification. Should we see a critical lab result, you will be contacted sooner.   If You Need Anything After Your Visit  If you have any questions or concerns for your doctor, please call our main line at [phone] and press option 4 to reach your doctor's medical assistant. If no one answers, please leave a voicemail as directed and we will return your call as soon as possible. Messages left after 4 pm will be answered the following business day.   You may also send us a message via MyChart. We typically respond to MyChart messages within 1-2 business days.  For prescription refills, please ask your pharmacy to contact our office. Our fax number is [phone].  If you have an urgent issue when the clinic is closed that cannot wait until the next business day, you can page your doctor at the number below.    Please note that while we do our best to be available for urgent issues outside of office hours, we are not available 24/7.   If you have an urgent issue and are unable to reach us, you may choose to seek medical care at your doctor's office, retail clinic, urgent care center, or emergency room.  If you have a medical emergency, please immediately call 911 or go to the emergency department.  Pager Numbers  - Dr. Kowalski: [phone]  - Dr. Moye: [phone]  - Dr. Stewart:  [phone]  In the event of inclement weather, please call our main line at [phone] for an update on the status of any delays or closures.  Dermatology Medication Tips: Please keep the boxes that topical medications come in in order to help keep track of the instructions about where and how to use these. Pharmacies typically print the medication instructions only on the boxes and not directly on the medication tubes.   If your medication is too expensive, please contact our office at [phone] option 4 or send us a message through MyChart.   We are unable to tell what your co-pay for medications will be in advance as this is different depending on your insurance coverage. However, we may be able to find a substitute medication at lower cost or fill out paperwork to get insurance to cover a needed medication.   If a prior authorization is required to get your medication covered by your insurance company, please allow us 1-2 business days to complete this process.  Drug prices often vary depending on where the prescription is filled and some pharmacies may offer cheaper prices.  The website www.goodrx.com contains coupons for medications through different pharmacies. The prices here do not account for what the cost may be with help from insurance (it may be cheaper with your insurance), but the website can give you the price if you did not use any insurance.  - You can print the associated coupon and take it with   your prescription to the pharmacy.  - You may also stop by our office during regular business hours and pick up a GoodRx coupon card.  - If you need your prescription sent electronically to a different pharmacy, notify our office through [facility] MyChart or by phone at [phone] option 4.     Si Usted Necesita Algo Despus de Su Visita  Tambin puede enviarnos un mensaje a travs de MyChart. Por lo general respondemos a los mensajes de MyChart en el transcurso de 1 a 2  das hbiles.  Para renovar recetas, por favor pida a su farmacia que se ponga en contacto con nuestra oficina. Nuestro nmero de fax es el [phone].  Si tiene un asunto urgente cuando la clnica est cerrada y que no puede esperar hasta el siguiente da hbil, puede llamar/localizar a su doctor(a) al nmero que aparece a continuacin.   Por favor, tenga en cuenta que aunque hacemos todo lo posible para estar disponibles para asuntos urgentes fuera del horario de oficina, no estamos disponibles las 24 horas del da, los 7 das de la semana.   Si tiene un problema urgente y no puede comunicarse con nosotros, puede optar por buscar atencin mdica  en el consultorio de su doctor(a), en una clnica privada, en un centro de atencin urgente o en una sala de emergencias.  Si tiene una emergencia mdica, por favor llame inmediatamente al 911 o vaya a la sala de emergencias.  Nmeros de bper  - Dr. Kowalski: [phone]  - Dra. Moye: [phone]  - Dra. Stewart: [phone]  En caso de inclemencias del tiempo, por favor llame a nuestra lnea principal al [phone] para una actualizacin sobre el estado de cualquier retraso o cierre.  Consejos para la medicacin en dermatologa: Por favor, guarde las cajas en las que vienen los medicamentos de uso tpico para ayudarle a seguir las instrucciones sobre dnde y cmo usarlos. Las farmacias generalmente imprimen las instrucciones del medicamento slo en las cajas y no directamente en los tubos del medicamento.   Si su medicamento es muy caro, por favor, pngase en contacto con nuestra oficina llamando al [phone] y presione la opcin 4 o envenos un mensaje a travs de MyChart.   No podemos decirle cul ser su copago por los medicamentos por adelantado ya que esto es diferente dependiendo de la cobertura de su seguro. Sin embargo, es posible que podamos encontrar un medicamento sustituto a menor costo o llenar un formulario para que el  seguro cubra el medicamento que se considera necesario.   Si se requiere una autorizacin previa para que su compaa de seguros cubra su medicamento, por favor permtanos de 1 a 2 das hbiles para completar este proceso.  Los precios de los medicamentos varan con frecuencia dependiendo del lugar de dnde se surte la receta y alguna farmacias pueden ofrecer precios ms baratos.  El sitio web www.goodrx.com tiene cupones para medicamentos de diferentes farmacias. Los precios aqu no tienen en cuenta lo que podra costar con la ayuda del seguro (puede ser ms barato con su seguro), pero el sitio web puede darle el precio si no utiliz ningn seguro.  - Puede imprimir el cupn correspondiente y llevarlo con su receta a la farmacia.  - Tambin puede pasar por nuestra oficina durante el horario de atencin regular y recoger una tarjeta de cupones de GoodRx.  - Si necesita que su receta se enve electrnicamente a una farmacia diferente, informe a nuestra oficina a travs de MyChart de [place]   o por telfono llamando al [phone] y presione la opcin 4.  

## 2022-05-20 ENCOUNTER — Encounter: Payer: Self-pay | Admitting: Dermatology

## 2022-05-21 ENCOUNTER — Other Ambulatory Visit (HOSPITAL_COMMUNITY): Payer: Self-pay

## 2022-06-06 ENCOUNTER — Encounter: Payer: Self-pay | Admitting: Internal Medicine

## 2022-06-06 ENCOUNTER — Ambulatory Visit (INDEPENDENT_AMBULATORY_CARE_PROVIDER_SITE_OTHER): Payer: Medicare Other | Admitting: Internal Medicine

## 2022-06-06 VITALS — BP 112/62 | HR 72 | Temp 97.8°F | Ht 69.0 in | Wt 250.0 lb

## 2022-06-06 DIAGNOSIS — M109 Gout, unspecified: Secondary | ICD-10-CM | POA: Insufficient documentation

## 2022-06-06 DIAGNOSIS — M10072 Idiopathic gout, left ankle and foot: Secondary | ICD-10-CM | POA: Diagnosis not present

## 2022-06-06 MED ORDER — COLCHICINE 0.6 MG PO TABS: 0.6000 mg | ORAL_TABLET | Freq: Two times a day (BID) | ORAL | 0 refills | Status: DC | PRN

## 2022-06-06 MED ORDER — PREDNISONE 10 MG PO TABS: ORAL_TABLET | ORAL | 0 refills | Status: DC

## 2022-06-06 NOTE — Assessment & Plan Note (Signed)
Will refill the colchicine--take bid till quiets down/then daily for a few days Also, prednisone 40 x 5, then 20mg  x 5
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
## 2022-06-24 ENCOUNTER — Ambulatory Visit (INDEPENDENT_AMBULATORY_CARE_PROVIDER_SITE_OTHER): Payer: Medicare Other | Admitting: Internal Medicine

## 2022-06-24 ENCOUNTER — Encounter: Payer: Self-pay | Admitting: Internal Medicine

## 2022-06-24 VITALS — BP 110/60 | HR 78 | Temp 97.3°F | Ht 69.0 in | Wt 252.0 lb

## 2022-06-24 DIAGNOSIS — M10072 Idiopathic gout, left ankle and foot: Secondary | ICD-10-CM | POA: Diagnosis not present

## 2022-06-24 MED ORDER — PREDNISONE 20 MG PO TABS
40.0000 mg | ORAL_TABLET | Freq: Every day | ORAL | 0 refills | Status: DC
Start: 2022-06-24 — End: 2022-07-08

## 2022-06-24 MED ORDER — ALLOPURINOL 100 MG PO TABS: 100.0000 mg | ORAL_TABLET | Freq: Every day | ORAL | 3 refills | Status: DC

## 2022-06-24 NOTE — Assessment & Plan Note (Signed)
Seems to be the same thing again Fairly classic for gout Known high uric acid Continue colchicine Prednisone 40 x 5, 20 x 5 Start allopurinol when pain gone---100mg  daily Follow up with Dr Reece Agar 2 weeks---recheck uric acid and titrate prn
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
## 2022-06-24 NOTE — Patient Instructions (Signed)
Please start the prednisone today and continue the colchicine. When your pain is gone, start the allopurinol 100mg  daily. Set up with Dr Reece Agar in 2 weeks  Low-Purine Eating Plan A low-purine eating plan involves making food choices to limit your purine intake. Purine is a kind of uric acid. Too much uric acid in your blood can cause certain conditions, such as gout and kidney stones. Eating a low-purine diet may help control these conditions. What are tips for following this plan? Shopping Avoid buying products that contain high-fructose corn syrup. Check for this on food labels. It is commonly found in many processed foods and soft drinks. Be sure to check for it in baked goods such as cookies, canned fruits, and cereals and cereal bars. Avoid buying veal, chicken breast with skin, lamb, and organ meats such as liver. These types of meats tend to have the highest purine content. Choose dairy products. These may lower uric acid levels. Avoid certain types of fish. Not all fish and seafood have high purine content. Examples with high purine content include anchovies, trout, tuna, sardines, and salmon. Avoid buying beverages that contain alcohol, particularly beer and hard liquor. Alcohol can affect the way your body gets rid of uric acid. Meal planning  Learn which foods do or do not affect you. If you find out that a food tends to cause your gout symptoms to flare up, avoid eating that food. You can enjoy foods that do not cause problems. If you have any questions about a food item, talk with your dietitian or health care provider. Reduce the overall amount of meat in your diet. When you do eat meat, choose ones with lower purine content. Include plenty of fruits and vegetables. Although some vegetables may have a high purine content--such as asparagus, mushrooms, spinach, or cauliflower--it has been shown that these do not contribute to uric acid blood levels as much. Consume at least 1 dairy serving a  day. This has been shown to decrease uric acid levels. General information If you drink alcohol: Limit how much you have to: 0-1 drink a day for women who are not pregnant. 0-2 drinks a day for men. Know how much alcohol is in a drink. In the [place] one drink equals one 12 oz bottle of beer (355 mL), one 5 oz glass of wine (148 mL), or one 1 oz glass of hard liquor (44 mL). Drink plenty of water. Try to drink enough to keep your urine pale yellow. Fluids can help remove uric acid from your body. Work with your health care provider and dietitian to develop a plan to achieve or maintain a healthy weight. Losing weight may help reduce uric acid in your blood. What foods are recommended? The following are some types of foods that are good choices when limiting purine intake: Fresh or frozen fruits and vegetables. Whole grains, breads, cereals, and pasta. Rice. Beans, peas, legumes. Nuts and seeds. Dairy products. Fats and oils. The items listed above may not be a complete list. Talk with a dietitian about what dietary choices are best for you. What foods are not recommended? Limit your intake of foods high in purines, including: Beer and other alcohol. Meat-based gravy or sauce. Canned or fresh fish, such as: Anchovies, sardines, herring, salmon, and tuna. Mussels and scallops. Codfish, trout, and haddock. Bacon, veal, chicken breast with skin, and lamb. Organ meats, such as: Liver or kidney. Tripe. Sweetbreads (thymus gland or pancreas). Wild Education officer, environmental. Yeast or yeast extract supplements.  Drinks sweetened with high-fructose corn syrup, such as soda. Processed foods made with high-fructose corn syrup. The items listed above may not be a complete list of foods and beverages you should limit. Contact a dietitian for more information. Summary Eating a low-purine diet may help control conditions caused by too much uric acid in the body, such as gout or kidney stones. Choose  low-purine foods, limit alcohol, and limit high-fructose corn syrup. You will learn over time which foods do or do not affect you. If you find out that a food tends to cause your gout symptoms to flare up, avoid eating that food. This information is not intended to replace advice given to you by your health care provider. Make sure you discuss any questions you have with your health care provider. Document Revised: 01/21/2021 Document Reviewed: 01/21/2021 Elsevier Patient Education  2023 [employer].

## 2022-07-05 ENCOUNTER — Other Ambulatory Visit: Payer: Self-pay | Admitting: Family Medicine

## 2022-07-05 NOTE — Telephone Encounter (Signed)
Potassium Last filled:  03/29/22, #180 Last OV:  01/12/22, fatigue, dizziness Next OV:  07/08/22, 2 wk gout f/u

## 2022-07-08 ENCOUNTER — Encounter: Payer: Self-pay | Admitting: Family Medicine

## 2022-07-08 ENCOUNTER — Ambulatory Visit (INDEPENDENT_AMBULATORY_CARE_PROVIDER_SITE_OTHER): Payer: Medicare Other | Admitting: Family Medicine

## 2022-07-08 VITALS — BP 118/64 | HR 78 | Temp 96.9°F | Ht 69.0 in | Wt 246.1 lb

## 2022-07-08 DIAGNOSIS — M109 Gout, unspecified: Secondary | ICD-10-CM | POA: Diagnosis not present

## 2022-07-08 DIAGNOSIS — E113559 Type 2 diabetes mellitus with stable proliferative diabetic retinopathy, unspecified eye: Secondary | ICD-10-CM | POA: Diagnosis not present

## 2022-07-08 DIAGNOSIS — Z7985 Long-term (current) use of injectable non-insulin antidiabetic drugs: Secondary | ICD-10-CM | POA: Diagnosis not present

## 2022-07-08 DIAGNOSIS — Z7984 Long term (current) use of oral hypoglycemic drugs: Secondary | ICD-10-CM | POA: Diagnosis not present

## 2022-07-08 MED ORDER — PREDNISONE 10 MG PO TABS: ORAL_TABLET | ORAL | 0 refills | Status: AC

## 2022-07-08 MED ORDER — COLCHICINE 0.6 MG PO TABS: 0.6000 mg | ORAL_TABLET | Freq: Every day | ORAL | 0 refills | Status: DC | PRN

## 2022-07-08 NOTE — Assessment & Plan Note (Signed)
Anticipate he has ongoing acute gouty attack of left lateral achilles insertion (enthesitis), left lateral ankle attack has improved after 2 prednisone tapers.  Will extend prednisone taper to 10mg  taper over 16 days starting at 40mg  daily as per below.  Will restart colchicine 0.6mg  daily.  Defer labs at this time - he's not yet started allopurinol.  Reassess at 2 wk f/u visit for CPE.

## 2022-07-08 NOTE — Progress Notes (Addendum)
Ph: [phone] Fax: [phone]   Patient ID: Chad [name], male    DOB: [birth year], 67 y.o.   MRN: [id number]  This visit was conducted in person.  BP 118/64   Pulse 78   Temp (!) 96.9 F (36.1 C) (Temporal)   Ht 5\' 9"  (1.753 m)   Wt 246 lb 2 oz (111.6 kg)   SpO2 97%   BMI 36.35 kg/m    CC: 2 wk f/u visit  Subjective:   HPI: Chad [name] is a 67 y.o. male presenting on 07/08/2022 for Medical Management of Chronic Issues (Here for 2 wk gout f/u, per Dr. Alphonsus Sias. Pt accompanied by wife, Olegario Messier. )   Saw Dr Alphonsus Sias x2 in the past month with acute gout of left ankle.  This was despite taking 2 colchicine daily.  He received prednisone taper x2 (40/20), then started on allopurinol 100mg  daily. He was recommended a low purine eating plan.   No recent beer, shrimp/shellfish, organ meats or red meat.   He finished a bottle of colchicine 2 tablets daily as well as 2 prednisone tapers. Ran out of colchicine on Monday. Prednisone helped swelling but pain has always been there.  Left lateral ankle pain has improved but he has residual pain to left heel at achilles tendon. Severe pain with resting heel on bed at night.   He hasn't started allopurinol yet.   Lab Results  Component Value Date   LABURIC 8.5 (H) 09/15/2021   Lab Results  Component Value Date   CREATININE 1.17 02/01/2022   BUN 13 02/01/2022   NA 137 02/01/2022   K 3.9 02/01/2022   CL 98 02/01/2022   CO2 29 02/01/2022   GFR = 65 Lab Results  Component Value Date   HGBA1C 5.6 12/22/2021   He's been having more chest discomfort recently - he is being followed by cardiology with next appointment 08/2022. Notes increased fatigue, low energy, more tired. He continues ASA, Toprol XL 25mg , atorvastatin 20mg  and SL nitro PRN. He takes bumex 3mg  daily. He has not taken metolazone for several months.   DM - sugars running 90s. Has tolerated prednisone well.      Relevant past medical, surgical, family and social  history reviewed and updated as indicated. Interim medical history since our last visit reviewed. Allergies and medications reviewed and updated. Outpatient Medications Prior to Visit  Medication Sig Dispense Refill   allopurinol (ZYLOPRIM) 100 MG tablet Take 1 tablet (100 mg total) by mouth daily. Increase as directed 90 tablet 3   aspirin 81 MG chewable tablet Chew 81 mg by mouth daily.     atorvastatin (LIPITOR) 20 MG tablet TAKE ONE TABLET BY MOUTH ONCE DAILY 90 tablet 0   bumetanide (BUMEX) 1 MG tablet Take 3 tablets (3 mg total) by mouth daily.     citalopram (CELEXA) 20 MG tablet TAKE ONE TABLET BY MOUTH ONCE DAILY 90 tablet 0   dapagliflozin propanediol (FARXIGA) 10 MG TABS tablet Take 10 mg by mouth daily before breakfast.     dexlansoprazole (DEXILANT) 60 MG capsule TAKE 1 CAPSULE BY MOUTH ONCE DAILY 90 capsule 1   famotidine (PEPCID) 20 MG tablet TAKE 1 TABLET BY MOUTH EVERY NIGHT AT BEDTIME (Patient taking differently: Take 20 mg by mouth daily.) 90 tablet 1   ferrous sulfate 325 (65 FE) MG EC tablet Take 1 tablet (325 mg total) by mouth daily with breakfast.     fluticasone (FLONASE) 50 MCG/ACT nasal spray Place 2  sprays into both nostrils daily. 16 g 6   levothyroxine (SYNTHROID) 125 MCG tablet TAKE ONE TAB BY MOUTH ONCE DAILY. TAKE ON AN EMPTY STOMACH WITH A GLASS OF WATER ATLEAST 30-60 MINUTES BEFORE BREAKFAST 90 tablet 0   Magnesium 400 MG TABS Take 400 mg by mouth 2 (two) times daily.     metoCLOPramide (REGLAN) 5 MG tablet TAKE ONE TABLET BY MOUTH THREE TIMES DAILY BEFORE MEALS (NEED APPT FOR FUTURE REFILLS) 90 tablet 2   metolazone (ZAROXOLYN) 2.5 MG tablet Take 1 tablet (2.5 mg total) by mouth once a week. As needed for fluid/swelling     metoprolol succinate (TOPROL-XL) 25 MG 24 hr tablet TAKE 1 TABLET BY MOUTH EVERY MORNING 30 tablet 5   mometasone (ELOCON) 0.1 % cream Apply to tender area on left ear QD-BID PRN up to 5d/wk 45 g 0   Multiple Vitamin (MULITIVITAMIN WITH  MINERALS) TABS Take 1 tablet by mouth daily.     nitroGLYCERIN (NITROSTAT) 0.4 MG SL tablet Place 1 tablet (0.4 mg total) under the tongue every 5 (five) minutes as needed for chest pain. 60 tablet 3   potassium chloride (KLOR-CON) 10 MEQ tablet TAKE ONE TABLET BY MOUTH TWICE A DAY 180 tablet 3   Semaglutide, 1 MG/DOSE, 4 MG/3ML SOPN Inject 1 mg as directed once a week. 3 mL 5   spironolactone (ALDACTONE) 25 MG tablet Take 25 mg by mouth daily.     VENTOLIN HFA 108 (90 Base) MCG/ACT inhaler INHALE 2 PUFFS INTO THE LUNGS EVERY 6 HOURS AS NEEDED FOR WHEEZING OR SHORTNESS OF BREATH 18 g 5   vitamin B-12 (CYANOCOBALAMIN) 500 MCG tablet Take 1 tablet (500 mcg total) by mouth daily.     vitamin E 400 UNIT capsule Take 400 Units by mouth daily.     colchicine 0.6 MG tablet Take 1 tablet (0.6 mg total) by mouth 2 (two) times daily as needed (gout flare). May take 2 tablets on first day of gout flare 60 tablet 0   predniSONE (DELTASONE) 20 MG tablet Take 2 tablets (40 mg total) by mouth daily. For 5 days then 1 tab daily for 5 days 15 tablet 0   No facility-administered medications prior to visit.     Per HPI unless specifically indicated in ROS section below Review of Systems  Objective:  BP 118/64   Pulse 78   Temp (!) 96.9 F (36.1 C) (Temporal)   Ht 5\' 9"  (1.753 m)   Wt 246 lb 2 oz (111.6 kg)   SpO2 97%   BMI 36.35 kg/m   Wt Readings from Last 3 Encounters:  07/08/22 246 lb 2 oz (111.6 kg)  06/24/22 252 lb (114.3 kg)  06/06/22 250 lb (113.4 kg)      Physical Exam Vitals and nursing note reviewed.  Constitutional:      Appearance: Normal appearance. He is not ill-appearing.  Musculoskeletal:        General: Tenderness present.     Right lower leg: No edema.     Left lower leg: No edema.       Legs:     Comments:  2+DP bilaterally Marked tenderness with swelling to lateral left heel at insertion of achilles tendon into calcaneus Neg calc squeeze bilaterally No pain with  palpation along achilles tendons bilaterally No pain or ligament laxity with bilateral ankle ligament testing  Skin:    General: Skin is warm and dry.     Findings: No rash.  Comments:  No erythema or warmth to skin Dry skin to bilateral heels  Neurological:     Mental Status: He is alert.  Psychiatric:        Mood and Affect: Mood normal.        Behavior: Behavior normal.       Results for orders placed or performed in visit on 02/18/22  POC COVID-19  Result Value Ref Range   SARS Coronavirus 2 Ag Negative Negative  POCT Influenza A/B  Result Value Ref Range   Influenza A, POC Negative Negative   Influenza B, POC Negative Negative   *Note: Due to a large number of results and/or encounters for the requested time period, some results have not been displayed. A complete set of results can be found in Results Review.    Assessment & Plan:   Problem List Items Addressed This Visit     Diabetes mellitus type 2 with retinopathy (HCC)    Sugars remain well controlled on current regimen - farxiga 10mg  daily, ozempic 1mg  weekly.       Gout attack - Primary    Anticipate he has ongoing acute gouty attack of left lateral achilles insertion (enthesitis), left lateral ankle attack has improved after 2 prednisone tapers.  Will extend prednisone taper to 10mg  taper over 16 days starting at 40mg  daily as per below.  Will restart colchicine 0.6mg  daily.  Defer labs at this time - he's not yet started allopurinol.  Reassess at 2 wk f/u visit for CPE.         Meds ordered this encounter  Medications   predniSONE (DELTASONE) 10 MG tablet    Sig: Take 4 tablets (40 mg total) by mouth daily with breakfast for 3 days, THEN 3 tablets (30 mg total) daily with breakfast for 3 days, THEN 2 tablets (20 mg total) daily with breakfast for 3 days, THEN 1 tablet (10 mg total) daily with breakfast for 3 days, THEN 0.5 tablets (5 mg total) daily with breakfast for 4 days.    Dispense:  32 tablet     Refill:  0   colchicine 0.6 MG tablet    Sig: Take 1 tablet (0.6 mg total) by mouth daily as needed (gout flare). May take 2 tablets on first day of gout flare    Dispense:  60 tablet    Refill:  0    No orders of the defined types were placed in this encounter.   Patient Instructions  I think you have ongoing gout flare at insertion of achilles tendon into heel  Restart prednisone taper sent to pharmacy, we will extend it to 2 weeks this time.  Restart colchicine to take as needed for gout flare.  Start allopurinol once pain fully gone.  Keep appointment in June.   Follow up plan: Return if symptoms worsen or fail to improve.  Eustaquio Boyden, MD

## 2022-07-08 NOTE — Patient Instructions (Addendum)
I think you have ongoing gout flare at insertion of achilles tendon into heel  Restart prednisone taper sent to pharmacy, we will extend it to 2 weeks this time.  Restart colchicine to take as needed for gout flare.  Start allopurinol once pain fully gone.  Keep appointment in June.

## 2022-07-08 NOTE — Assessment & Plan Note (Signed)
Sugars remain well controlled on current regimen - farxiga 10mg  daily, ozempic 1mg  weekly.

## 2022-07-12 ENCOUNTER — Other Ambulatory Visit: Payer: Self-pay | Admitting: Family Medicine

## 2022-07-12 DIAGNOSIS — K219 Gastro-esophageal reflux disease without esophagitis: Secondary | ICD-10-CM

## 2022-07-13 ENCOUNTER — Other Ambulatory Visit (HOSPITAL_COMMUNITY): Payer: Self-pay

## 2022-07-25 ENCOUNTER — Encounter: Payer: Self-pay | Admitting: Family Medicine

## 2022-07-25 ENCOUNTER — Ambulatory Visit (INDEPENDENT_AMBULATORY_CARE_PROVIDER_SITE_OTHER): Payer: Medicare Other | Admitting: Family Medicine

## 2022-07-25 VITALS — BP 110/62 | HR 74 | Temp 97.6°F | Ht 69.0 in | Wt 245.0 lb

## 2022-07-25 DIAGNOSIS — F418 Other specified anxiety disorders: Secondary | ICD-10-CM

## 2022-07-25 DIAGNOSIS — E611 Iron deficiency: Secondary | ICD-10-CM

## 2022-07-25 DIAGNOSIS — Z7984 Long term (current) use of oral hypoglycemic drugs: Secondary | ICD-10-CM

## 2022-07-25 DIAGNOSIS — R5381 Other malaise: Secondary | ICD-10-CM

## 2022-07-25 DIAGNOSIS — E1142 Type 2 diabetes mellitus with diabetic polyneuropathy: Secondary | ICD-10-CM | POA: Diagnosis not present

## 2022-07-25 DIAGNOSIS — I5032 Chronic diastolic (congestive) heart failure: Secondary | ICD-10-CM

## 2022-07-25 DIAGNOSIS — E785 Hyperlipidemia, unspecified: Secondary | ICD-10-CM | POA: Diagnosis not present

## 2022-07-25 DIAGNOSIS — Z125 Encounter for screening for malignant neoplasm of prostate: Secondary | ICD-10-CM | POA: Diagnosis not present

## 2022-07-25 DIAGNOSIS — E039 Hypothyroidism, unspecified: Secondary | ICD-10-CM | POA: Diagnosis not present

## 2022-07-25 DIAGNOSIS — E113559 Type 2 diabetes mellitus with stable proliferative diabetic retinopathy, unspecified eye: Secondary | ICD-10-CM

## 2022-07-25 DIAGNOSIS — M109 Gout, unspecified: Secondary | ICD-10-CM | POA: Diagnosis not present

## 2022-07-25 DIAGNOSIS — E1169 Type 2 diabetes mellitus with other specified complication: Secondary | ICD-10-CM

## 2022-07-25 DIAGNOSIS — H34812 Central retinal vein occlusion, left eye, with macular edema: Secondary | ICD-10-CM

## 2022-07-25 DIAGNOSIS — K219 Gastro-esophageal reflux disease without esophagitis: Secondary | ICD-10-CM | POA: Diagnosis not present

## 2022-07-25 DIAGNOSIS — Z7189 Other specified counseling: Secondary | ICD-10-CM

## 2022-07-25 DIAGNOSIS — K7460 Unspecified cirrhosis of liver: Secondary | ICD-10-CM | POA: Diagnosis not present

## 2022-07-25 DIAGNOSIS — I10 Essential (primary) hypertension: Secondary | ICD-10-CM | POA: Diagnosis not present

## 2022-07-25 DIAGNOSIS — K7581 Nonalcoholic steatohepatitis (NASH): Secondary | ICD-10-CM | POA: Diagnosis not present

## 2022-07-25 DIAGNOSIS — G4733 Obstructive sleep apnea (adult) (pediatric): Secondary | ICD-10-CM | POA: Diagnosis not present

## 2022-07-25 DIAGNOSIS — Z9884 Bariatric surgery status: Secondary | ICD-10-CM

## 2022-07-25 DIAGNOSIS — I700 Atherosclerosis of aorta: Secondary | ICD-10-CM

## 2022-07-25 DIAGNOSIS — R5383 Other fatigue: Secondary | ICD-10-CM

## 2022-07-25 DIAGNOSIS — J019 Acute sinusitis, unspecified: Secondary | ICD-10-CM

## 2022-07-25 DIAGNOSIS — Z7985 Long-term (current) use of injectable non-insulin antidiabetic drugs: Secondary | ICD-10-CM

## 2022-07-25 DIAGNOSIS — K3184 Gastroparesis: Secondary | ICD-10-CM

## 2022-07-25 DIAGNOSIS — E1143 Type 2 diabetes mellitus with diabetic autonomic (poly)neuropathy: Secondary | ICD-10-CM

## 2022-07-25 LAB — CBC WITH DIFFERENTIAL/PLATELET
Basophils Absolute: 0 10*3/uL (ref 0.0–0.1)
Basophils Relative: 0.4 % (ref 0.0–3.0)
Eosinophils Absolute: 0 10*3/uL (ref 0.0–0.7)
Eosinophils Relative: 0.5 % (ref 0.0–5.0)
HCT: 51.4 % (ref 39.0–52.0)
Hemoglobin: 17.2 g/dL — ABNORMAL HIGH (ref 13.0–17.0)
Lymphocytes Relative: 11.2 % — ABNORMAL LOW (ref 12.0–46.0)
Lymphs Abs: 1.1 10*3/uL (ref 0.7–4.0)
MCHC: 33.5 g/dL (ref 30.0–36.0)
MCV: 87.7 fl (ref 78.0–100.0)
Monocytes Absolute: 0.9 10*3/uL (ref 0.1–1.0)
Monocytes Relative: 9.4 % (ref 3.0–12.0)
Neutro Abs: 7.3 10*3/uL (ref 1.4–7.7)
Neutrophils Relative %: 78.5 % — ABNORMAL HIGH (ref 43.0–77.0)
Platelets: 162 10*3/uL (ref 150.0–400.0)
RBC: 5.87 Mil/uL — ABNORMAL HIGH (ref 4.22–5.81)
RDW: 13.6 % (ref 11.5–15.5)
WBC: 9.4 10*3/uL (ref 4.0–10.5)

## 2022-07-25 LAB — COMPREHENSIVE METABOLIC PANEL
ALT: 48 U/L (ref 0–53)
AST: 43 U/L — ABNORMAL HIGH (ref 0–37)
Albumin: 4.8 g/dL (ref 3.5–5.2)
Alkaline Phosphatase: 159 U/L — ABNORMAL HIGH (ref 39–117)
BUN: 20 mg/dL (ref 6–23)
CO2: 27 mEq/L (ref 19–32)
Calcium: 9.5 mg/dL (ref 8.4–10.5)
Chloride: 96 mEq/L (ref 96–112)
Creatinine, Ser: 1.09 mg/dL (ref 0.40–1.50)
GFR: 70.28 mL/min (ref >60.00)
Glucose, Bld: 108 mg/dL — ABNORMAL HIGH (ref 70–99)
Potassium: 3.7 mEq/L (ref 3.5–5.1)
Sodium: 136 mEq/L (ref 135–145)
Total Bilirubin: 1.8 mg/dL — ABNORMAL HIGH (ref 0.2–1.2)
Total Protein: 7 g/dL (ref 6.0–8.3)

## 2022-07-25 LAB — MICROALBUMIN / CREATININE URINE RATIO
Creatinine,U: 17.8 mg/dL
Microalb Creat Ratio: 3.9 mg/g (ref 0.0–30.0)
Microalb, Ur: <0.7 mg/dL (ref 0.0–1.9)

## 2022-07-25 LAB — HEMOGLOBIN A1C: Hgb A1c MFr Bld: 6.5 % (ref 4.6–6.5)

## 2022-07-25 LAB — PROTIME-INR
INR: 1.1 ratio — ABNORMAL HIGH (ref 0.8–1.0)
Prothrombin Time: 11.7 s (ref 9.6–13.1)

## 2022-07-25 LAB — LIPID PANEL
Cholesterol: 126 mg/dL (ref 0–200)
HDL: 45.6 mg/dL (ref >39.00)
LDL Cholesterol: 53 mg/dL (ref 0–99)
NonHDL: 80.47
Total CHOL/HDL Ratio: 3
Triglycerides: 135 mg/dL (ref 0.0–149.0)
VLDL: 27 mg/dL (ref 0.0–40.0)

## 2022-07-25 LAB — FERRITIN: Ferritin: 247.8 ng/mL (ref 22.0–322.0)

## 2022-07-25 LAB — IBC PANEL
Iron: 139 ug/dL (ref 42–165)
Saturation Ratios: 45.1 % (ref 20.0–50.0)
TIBC: 308 ug/dL (ref 250.0–450.0)
Transferrin: 220 mg/dL (ref 212.0–360.0)

## 2022-07-25 LAB — TSH: TSH: 3.93 u[IU]/mL (ref 0.35–5.50)

## 2022-07-25 LAB — URIC ACID: Uric Acid, Serum: 7.9 mg/dL — ABNORMAL HIGH (ref 4.0–7.8)

## 2022-07-25 LAB — PSA, MEDICARE: PSA: 2.13 ng/ml (ref 0.10–4.00)

## 2022-07-25 LAB — VITAMIN B12: Vitamin B-12: 854 pg/mL (ref 211–911)

## 2022-07-25 MED ORDER — CITALOPRAM HYDROBROMIDE 20 MG PO TABS: 20.0000 mg | ORAL_TABLET | Freq: Every day | ORAL | 4 refills | Status: DC

## 2022-07-25 MED ORDER — ATORVASTATIN CALCIUM 20 MG PO TABS: 20.0000 mg | ORAL_TABLET | Freq: Every day | ORAL | 4 refills | Status: DC

## 2022-07-25 MED ORDER — DEXLANSOPRAZOLE 60 MG PO CPDR: 1.0000 | DELAYED_RELEASE_CAPSULE | Freq: Every day | ORAL | 4 refills | Status: DC

## 2022-07-25 MED ORDER — SEMAGLUTIDE (1 MG/DOSE) 4 MG/3ML ~~LOC~~ SOPN: 1.0000 mg | PEN_INJECTOR | SUBCUTANEOUS | 5 refills | Status: DC

## 2022-07-25 MED ORDER — FAMOTIDINE 20 MG PO TABS: 20.0000 mg | ORAL_TABLET | Freq: Every day | ORAL | 4 refills | Status: DC

## 2022-07-25 MED ORDER — LEVOTHYROXINE SODIUM 125 MCG PO TABS
ORAL_TABLET | ORAL | 4 refills | Status: DC
Start: 2022-07-25 — End: 2023-02-27

## 2022-07-25 NOTE — Assessment & Plan Note (Signed)
Encouraged healthy diet and lifestyle choices to affect sustainable weight loss.  ?

## 2022-07-25 NOTE — Progress Notes (Signed)
Ph: [phone] Fax: [phone]   Patient ID: Chad [name], male    DOB: 20-Feb-1956, 67 y.o.   MRN: [id number]  This visit was conducted in person.  BP 110/62   Pulse 74   Temp 97.6 F (36.4 C) (Temporal)   Ht 5\' 9"  (1.753 m)   Wt 245 lb (111.1 kg)   SpO2 98%   BMI 36.18 kg/m    CC: CPE Subjective:   HPI: Chad [name] is a 67 y.o. male presenting on 07/25/2022 for Annual Exam (Fasting )   Saw health advisor 02/2022 for medicare wellness visit. Note reviewed.   No results found.  Flowsheet Row Office Visit from 07/25/2022 in [facility] [place]  PHQ-2 Total Score 3          07/25/2022   10:31 AM 03/14/2022    8:59 AM 03/12/2021    9:05 AM 03/11/2020    9:02 AM 11/21/2018   10:34 AM  Fall Risk   Falls in the past year? 0 0 0 1 1  Number falls in past yr: 0 0 0 0 1  Injury with Fall? 0  0 1 1  Comment    broke ribs neck injury  Risk for fall due to : No Fall Risks History of fall(s) No Fall Risks Medication side effect History of fall(s);Impaired balance/gait;Impaired mobility;Medication side effect  Follow up Falls evaluation completed Falls evaluation completed;Falls prevention discussed Falls prevention discussed Falls evaluation completed;Falls prevention discussed Falls evaluation completed;Falls prevention discussed   Sinus congestion for the past week, frontal sinus pressure, headache in the morning. Feeling more lightheaded as well. Has been in bed. Taking flonase without benefit, tylenol. Not on allergy medication. Significant sneezing.   Recent prolonged gout flare to L lateral achilles insertion (enthesitis). Treated with 2 prednisone tapers, latest one extended and colchicine 0.6mg  daily PRN. Due for urate level on colchicine. This is feeling better. Has not yet started allopurinol. Last prednisone 5mg  dose was on Friday.   Notes chronic urinary urgency with dripping if he waits too long.  Has GI f/u later this month.    Preventative: COLONOSCOPY Date: 12/2014 TAs, mod diverticulosis, rpt 3 yrs ([facility]) COLONOSCOPY 08/2015 - HP, diverticulosis (Pyrt[facility]) COLONOSCOPY WITH PROPOFOL 02/17/2020 - inflammatory polyp, rpt 5 yrs ([facility], Carie Caddy, MD) ESOPHAGOGASTRODUODENOSCOPY (EGD) WITH PROPOFOL 02/17/2020 - chronic gastritis, neg H pylori ([facility], Carie Caddy, MD) Prostate cancer screen yearly  Lung cancer screening - not eligible Flu shot yearly  COVID vaccine Pfizer 03/2019, 04/2019, booster 01/2020 Td - 01/2011  Pneumovax 2008, 2013  Prevnar-13 02/2020 Shingrix - 11/2018, 06/2019 Discussed advanced directives, doesn't think would want prolonged life support, but will think about this. Would want wife to be HCPOA. Packet previously provided and again today. Would want ventilation support only if he gets coronavirus infection.  Seat belt use discussed Sunscreen use discussed, sees dermatology [facility] Dr Gwen Pounds regularly.  Non smoker Alcohol - none Dentist - yearly Eye exam yearly - 2021. L vision loss due to CRVO. [place] [facility] Bowel - no constipation  Bladder - no incontinence    Caffeine: 2 cups coffee Lives with wife, 2 dogs Occupation: Retired, used to Visual merchandiser rock, on disability for stomach and pain and severe GERD Activity: 1 mile a few times a week Diet: lots of water, good fruits/vegetables. Avoiding fried foods.     Relevant past medical, surgical, family and social history reviewed and updated as indicated. Interim medical history since our  last visit reviewed. Allergies and medications reviewed and updated. Outpatient Medications Prior to Visit  Medication Sig Dispense Refill   allopurinol (ZYLOPRIM) 100 MG tablet Take 1 tablet (100 mg total) by mouth daily. Increase as directed 90 tablet 3   aspirin 81 MG chewable tablet Chew 81 mg by mouth daily.     bumetanide (BUMEX) 1 MG tablet Take 3 tablets (3 mg total) by mouth daily.     colchicine 0.6 MG tablet Take 1 tablet (0.6 mg total) by  mouth daily as needed (gout flare). May take 2 tablets on first day of gout flare 60 tablet 0   dapagliflozin propanediol (FARXIGA) 10 MG TABS tablet Take 10 mg by mouth daily before breakfast.     ferrous sulfate 325 (65 FE) MG EC tablet Take 1 tablet (325 mg total) by mouth daily with breakfast.     fluticasone (FLONASE) 50 MCG/ACT nasal spray Place 2 sprays into both nostrils daily. 16 g 6   Magnesium 400 MG TABS Take 400 mg by mouth 2 (two) times daily.     metoCLOPramide (REGLAN) 5 MG tablet TAKE ONE TABLET BY MOUTH THREE TIMES DAILY BEFORE MEALS (NEED APPT FOR FUTURE REFILLS) 90 tablet 2   metolazone (ZAROXOLYN) 2.5 MG tablet Take 1 tablet (2.5 mg total) by mouth once a week. As needed for fluid/swelling     metoprolol succinate (TOPROL-XL) 25 MG 24 hr tablet TAKE 1 TABLET BY MOUTH EVERY MORNING 30 tablet 5   mometasone (ELOCON) 0.1 % cream Apply to tender area on left ear QD-BID PRN up to 5d/wk 45 g 0   Multiple Vitamin (MULITIVITAMIN WITH MINERALS) TABS Take 1 tablet by mouth daily.     nitroGLYCERIN (NITROSTAT) 0.4 MG SL tablet Place 1 tablet (0.4 mg total) under the tongue every 5 (five) minutes as needed for chest pain. 60 tablet 3   potassium chloride (KLOR-CON) 10 MEQ tablet TAKE ONE TABLET BY MOUTH TWICE A DAY 180 tablet 3   spironolactone (ALDACTONE) 25 MG tablet Take 25 mg by mouth daily.     VENTOLIN HFA 108 (90 Base) MCG/ACT inhaler INHALE 2 PUFFS INTO THE LUNGS EVERY 6 HOURS AS NEEDED FOR WHEEZING OR SHORTNESS OF BREATH 18 g 5   vitamin B-12 (CYANOCOBALAMIN) 500 MCG tablet Take 1 tablet (500 mcg total) by mouth daily.     vitamin E 400 UNIT capsule Take 400 Units by mouth daily.     atorvastatin (LIPITOR) 20 MG tablet TAKE ONE TABLET BY MOUTH ONCE DAILY 90 tablet 0   citalopram (CELEXA) 20 MG tablet TAKE ONE TABLET BY MOUTH ONCE DAILY 90 tablet 0   dexlansoprazole (DEXILANT) 60 MG capsule TAKE ONE CAPSULE BY MOUTH ONCE DAILY 90 capsule 0   famotidine (PEPCID) 20 MG tablet TAKE 1  TABLET BY MOUTH EVERY NIGHT AT BEDTIME (Patient taking differently: Take 20 mg by mouth daily.) 90 tablet 1   levothyroxine (SYNTHROID) 125 MCG tablet TAKE ONE TAB BY MOUTH ONCE DAILY. TAKE ON AN EMPTY STOMACH WITH A GLASS OF WATER ATLEAST 30-60 MINUTES BEFORE BREAKFAST 90 tablet 0   Semaglutide, 1 MG/DOSE, 4 MG/3ML SOPN Inject 1 mg as directed once a week. 3 mL 5   No facility-administered medications prior to visit.     Per HPI unless specifically indicated in ROS section below Review of Systems  Objective:  BP 110/62   Pulse 74   Temp 97.6 F (36.4 C) (Temporal)   Ht 5\' 9"  (1.753 m)   Wt 245  lb (111.1 kg)   SpO2 98%   BMI 36.18 kg/m   Wt Readings from Last 3 Encounters:  07/25/22 245 lb (111.1 kg)  07/08/22 246 lb 2 oz (111.6 kg)  06/24/22 252 lb (114.3 kg)      Physical Exam Vitals and nursing note reviewed.  Constitutional:      General: He is not in acute distress.    Appearance: Normal appearance. He is well-developed. He is not ill-appearing.  HENT:     Head: Normocephalic and atraumatic.     Right Ear: Hearing, tympanic membrane, ear canal and external ear normal.     Left Ear: Hearing, tympanic membrane, ear canal and external ear normal.     Nose: Congestion present. No rhinorrhea.     Right Turbinates: Swollen and pale. Not enlarged.     Left Turbinates: Swollen and pale. Not enlarged.     Right Sinus: Maxillary sinus tenderness and frontal sinus tenderness present.     Left Sinus: Maxillary sinus tenderness and frontal sinus tenderness present.     Mouth/Throat:     Mouth: Mucous membranes are moist.     Pharynx: Oropharynx is clear. No oropharyngeal exudate or posterior oropharyngeal erythema.  Eyes:     General: No scleral icterus.    Extraocular Movements: Extraocular movements intact.     Conjunctiva/sclera: Conjunctivae normal.     Pupils: Pupils are equal, round, and reactive to light.  Neck:     Thyroid: No thyroid mass or thyromegaly.      Vascular: No carotid bruit.  Cardiovascular:     Rate and Rhythm: Normal rate and regular rhythm.     Pulses: Normal pulses.          Radial pulses are 2+ on the right side and 2+ on the left side.     Heart sounds: Normal heart sounds. No murmur heard. Pulmonary:     Effort: Pulmonary effort is normal. No respiratory distress.     Breath sounds: Normal breath sounds. No wheezing, rhonchi or rales.  Abdominal:     General: Bowel sounds are normal. There is no distension.     Palpations: Abdomen is soft. There is no mass.     Tenderness: There is no abdominal tenderness. There is no guarding or rebound.     Hernia: No hernia is present.  Musculoskeletal:        General: Normal range of motion.     Cervical back: Normal range of motion and neck supple.     Right lower leg: No edema.     Left lower leg: No edema.  Lymphadenopathy:     Cervical: No cervical adenopathy.  Skin:    General: Skin is warm and dry.     Findings: No rash.  Neurological:     General: No focal deficit present.     Mental Status: He is alert and oriented to person, place, and time.  Psychiatric:        Mood and Affect: Mood normal.        Behavior: Behavior normal.        Thought Content: Thought content normal.        Judgment: Judgment normal.       Results for orders placed or performed in visit on 02/18/22  POC COVID-19  Result Value Ref Range   SARS Coronavirus 2 Ag Negative Negative  POCT Influenza A/B  Result Value Ref Range   Influenza A, POC Negative Negative   Influenza B, POC  Negative Negative   *Note: Due to a large number of results and/or encounters for the requested time period, some results have not been displayed. A complete set of results can be found in Results Review.   Lab Results  Component Value Date   HGBA1C 5.6 12/22/2021   Assessment & Plan:   Problem List Items Addressed This Visit     Advanced care planning/counseling discussion - Primary (Chronic)    Discussed  advanced directives, doesn't think would want prolonged life support, but will think about this. Would want wife to be HCPOA. Packet previously provided and again today. Would want ventilation support only if he gets coronavirus infection.       Diabetes mellitus type 2 with retinopathy (HCC)    Chronic, update A1c.  Currently on farxiga and ozempic in place of mounjaro due to shortage.       Relevant Medications   atorvastatin (LIPITOR) 20 MG tablet   Semaglutide, 1 MG/DOSE, 4 MG/3ML SOPN   Other Relevant Orders   Microalbumin / creatinine urine ratio   Hemoglobin A1c   Vitamin B12   HTN (hypertension)    Chronic, stable.       Relevant Medications   atorvastatin (LIPITOR) 20 MG tablet   Hyperlipidemia associated with type 2 diabetes mellitus (HCC)    Chronic, update FLP on atorvastatin 20mg  daily. The ASCVD Risk score (Arnett DK, et al., 2019) failed to calculate for the following reasons:   The valid total cholesterol range is 130 to 320 mg/dL       Relevant Medications   atorvastatin (LIPITOR) 20 MG tablet   Semaglutide, 1 MG/DOSE, 4 MG/3ML SOPN   Other Relevant Orders   Lipid panel   Comprehensive metabolic panel   GERD (gastroesophageal reflux disease)    Chronic, stable period on dexilant with pepcid. Followed by GI.       Relevant Medications   dexlansoprazole (DEXILANT) 60 MG capsule   famotidine (PEPCID) 20 MG tablet   Hypothyroidism    Chronic, continues levothyroxine daily - update TSH.       Relevant Medications   levothyroxine (SYNTHROID) 125 MCG tablet   Other Relevant Orders   TSH   OSA (obstructive sleep apnea)    Trouble tolerating CPAP.       Severe obesity (BMI 35.0-39.9) with comorbidity (HCC)    Encouraged healthy diet and lifestyle choices to affect sustainable weight loss.       Relevant Medications   Semaglutide, 1 MG/DOSE, 4 MG/3ML SOPN   Chronic diastolic CHF (congestive heart failure) (HCC)    Seems euvolemic  today Followed by [facility] cardiology.       Relevant Medications   atorvastatin (LIPITOR) 20 MG tablet   Diabetic gastroparesis (HCC)    Continues reglan through GI      Relevant Medications   atorvastatin (LIPITOR) 20 MG tablet   Semaglutide, 1 MG/DOSE, 4 MG/3ML SOPN   Bariatric surgery status   Central retinal vein occlusion with macular edema of left eye   Relevant Medications   atorvastatin (LIPITOR) 20 MG tablet   Liver cirrhosis secondary to NASH (HCC)    Followed by GI (Py[facility]le) with reassuring latest abd MRI 01/2022       Relevant Orders   Protime-INR   Diabetic neuropathy associated with type 2 diabetes mellitus (HCC)   Relevant Medications   atorvastatin (LIPITOR) 20 MG tablet   Semaglutide, 1 MG/DOSE, 4 MG/3ML SOPN   Other Relevant Orders   Hemoglobin A1c  Vitamin B12   Situational anxiety   Relevant Medications   citalopram (CELEXA) 20 MG tablet   Iron deficiency    Continues oral iron - will see if we can add iron panel.      Relevant Orders   Ferritin   IBC panel   Atherosclerosis of aorta (HCC)    Continue aspirin, statin.       Relevant Medications   atorvastatin (LIPITOR) 20 MG tablet   Acute sinusitis    Anticipate viral process. Supportive measures reviewed. Update if ongoing past 10 days to start abx course.      Gout attack    Gout recently treated with extended prednisone taper + colchicine.  Pain has largely resolved.  Start allopurinol 100mg  daily with colchicine PRN.       Relevant Orders   Uric acid   Cortisol-am, blood   CBC with Differential/Platelet   Malaise and fatigue    Worse since stopping prednisone taper, in setting of sinus congestion.  ?sinusitis related, r/o adrenal insuff. Check cortisol level. Low threshold to restart prolonged prednisone taper      Other Visit Diagnoses     Special screening for malignant neoplasm of prostate       Relevant Orders   PSA, Medicare        Meds ordered this encounter   Medications   atorvastatin (LIPITOR) 20 MG tablet    Sig: Take 1 tablet (20 mg total) by mouth daily.    Dispense:  90 tablet    Refill:  4   citalopram (CELEXA) 20 MG tablet    Sig: Take 1 tablet (20 mg total) by mouth daily.    Dispense:  90 tablet    Refill:  4   dexlansoprazole (DEXILANT) 60 MG capsule    Sig: Take 1 capsule (60 mg total) by mouth daily.    Dispense:  90 capsule    Refill:  4   famotidine (PEPCID) 20 MG tablet    Sig: Take 1 tablet (20 mg total) by mouth at bedtime.    Dispense:  90 tablet    Refill:  4   levothyroxine (SYNTHROID) 125 MCG tablet    Sig: TAKE ONE TAB BY MOUTH ONCE DAILY. TAKE ON AN EMPTY STOMACH WITH A GLASS OF WATER ATLEAST 30-60 MINUTES BEFORE BREAKFAST    Dispense:  90 tablet    Refill:  4   Semaglutide, 1 MG/DOSE, 4 MG/3ML SOPN    Sig: Inject 1 mg as directed once a week.    Dispense:  3 mL    Refill:  5    To replace mounjaro 7.5mg  until it is again available.    Orders Placed This Encounter  Procedures   Microalbumin / creatinine urine ratio   Hemoglobin A1c   Uric acid   PSA, Medicare   Cortisol-am, blood   Lipid panel   Comprehensive metabolic panel   TSH   Protime-INR   CBC with Differential/Platelet   Ferritin   IBC panel   Vitamin B12    Patient Instructions  Go ahead and start allopurinol 100mg  daily.  Labs today.  Reschedule eye doctor appointment.  Good to see you today Return as needed or in 1 year for next physical.  New advanced directive packet provided today   You have a sinus infection, likely viral. Push fluids and plenty of rest. Nasal saline irrigation or neti pot to help drain sinuses. May use plain mucinex with plenty of fluid to help mobilize mucous.  Please let us know if fever >101.5, trouble opening/closing mouth, difficulty swallowing, or worsening instead of improving as expected.   Follow up plan: Return if symptoms worsen or fail to improve.  Eustaquio Boyden, MD

## 2022-07-25 NOTE — Assessment & Plan Note (Signed)
Chronic, update FLP on atorvastatin 20mg daily.  The ASCVD Risk score (Arnett DK, et al., 2019) failed to calculate for the following reasons:   The valid total cholesterol range is 130 to 320 mg/dL  

## 2022-07-25 NOTE — Assessment & Plan Note (Signed)
Worse since stopping prednisone taper, in setting of sinus congestion.  ?sinusitis related, r/o adrenal insuff. Check cortisol level. Low threshold to restart prolonged prednisone taper

## 2022-07-25 NOTE — Assessment & Plan Note (Signed)
Chronic, stable period on dexilant with pepcid. Followed by GI.

## 2022-07-25 NOTE — Assessment & Plan Note (Signed)
Followed by GI (Pyrtle) with reassuring latest abd MRI 01/2022

## 2022-07-25 NOTE — Assessment & Plan Note (Addendum)
Trouble tolerating CPAP.

## 2022-07-25 NOTE — Assessment & Plan Note (Addendum)
Chronic, update A1c.  Currently on farxiga and ozempic in place of mounjaro due to shortage.

## 2022-07-25 NOTE — Assessment & Plan Note (Signed)
Seems euvolemic today Followed by [facility] cardiology.

## 2022-07-25 NOTE — Assessment & Plan Note (Signed)
Continue aspirin, statin.  

## 2022-07-25 NOTE — Addendum Note (Signed)
Addended by: Eustaquio Boyden on: 07/25/2022 11:40 AM   Modules accepted: Level of Service

## 2022-07-25 NOTE — Assessment & Plan Note (Signed)
Anticipate viral process. Supportive measures reviewed. Update if ongoing past 10 days to start abx course.

## 2022-07-25 NOTE — Assessment & Plan Note (Signed)
Gout recently treated with extended prednisone taper + colchicine.  Pain has largely resolved.  Start allopurinol 100mg  daily with colchicine PRN.

## 2022-07-25 NOTE — Assessment & Plan Note (Signed)
Chronic, continues levothyroxine daily - update TSH.

## 2022-07-25 NOTE — Assessment & Plan Note (Signed)
Continues reglan through GI

## 2022-07-25 NOTE — Assessment & Plan Note (Signed)
Chronic, stable 

## 2022-07-25 NOTE — Assessment & Plan Note (Signed)
Discussed advanced directives, doesn't think would want prolonged life support, but will think about this. Would want wife to be HCPOA. Packet previously provided and again today. Would want ventilation support only if he gets coronavirus infection.  ?

## 2022-07-25 NOTE — Assessment & Plan Note (Signed)
Continues oral iron - will see if we can add iron panel.

## 2022-07-25 NOTE — Patient Instructions (Addendum)
Go ahead and start allopurinol 100mg  daily.  Labs today.  Reschedule eye doctor appointment.  Good to see you today Return as needed or in 1 year for next physical.  New advanced directive packet provided today   You have a sinus infection, likely viral. Push fluids and plenty of rest. Nasal saline irrigation or neti pot to help drain sinuses. May use plain mucinex with plenty of fluid to help mobilize mucous. Please let us know if fever >101.5, trouble opening/closing mouth, difficulty swallowing, or worsening instead of improving as expected.

## 2022-07-26 LAB — CORTISOL-AM, BLOOD: Cortisol - AM: 12.8 ug/dL

## 2022-08-02 ENCOUNTER — Other Ambulatory Visit: Payer: Self-pay | Admitting: Family Medicine

## 2022-08-02 MED ORDER — FERROUS SULFATE 325 (65 FE) MG PO TBEC: 325.0000 mg | DELAYED_RELEASE_TABLET | ORAL | Status: DC

## 2022-08-08 ENCOUNTER — Other Ambulatory Visit (HOSPITAL_COMMUNITY): Payer: Self-pay

## 2022-08-10 ENCOUNTER — Encounter: Payer: Self-pay | Admitting: Internal Medicine

## 2022-08-10 ENCOUNTER — Telehealth: Payer: Self-pay | Admitting: Family Medicine

## 2022-08-10 ENCOUNTER — Ambulatory Visit (INDEPENDENT_AMBULATORY_CARE_PROVIDER_SITE_OTHER): Payer: Medicare Other | Admitting: Internal Medicine

## 2022-08-10 VITALS — BP 110/68 | HR 77 | Ht 69.0 in | Wt 252.0 lb

## 2022-08-10 DIAGNOSIS — K219 Gastro-esophageal reflux disease without esophagitis: Secondary | ICD-10-CM

## 2022-08-10 DIAGNOSIS — K3184 Gastroparesis: Secondary | ICD-10-CM

## 2022-08-10 DIAGNOSIS — E1143 Type 2 diabetes mellitus with diabetic autonomic (poly)neuropathy: Secondary | ICD-10-CM

## 2022-08-10 DIAGNOSIS — K766 Portal hypertension: Secondary | ICD-10-CM

## 2022-08-10 DIAGNOSIS — M10072 Idiopathic gout, left ankle and foot: Secondary | ICD-10-CM | POA: Diagnosis not present

## 2022-08-10 DIAGNOSIS — Z794 Long term (current) use of insulin: Secondary | ICD-10-CM | POA: Diagnosis not present

## 2022-08-10 DIAGNOSIS — K7581 Nonalcoholic steatohepatitis (NASH): Secondary | ICD-10-CM

## 2022-08-10 DIAGNOSIS — K7460 Unspecified cirrhosis of liver: Secondary | ICD-10-CM

## 2022-08-10 MED ORDER — PREDNISONE 20 MG PO TABS
ORAL_TABLET | ORAL | 0 refills | Status: DC
Start: 2022-08-10 — End: 2023-03-06

## 2022-08-10 MED ORDER — CARVEDILOL 3.125 MG PO TABS: 3.1250 mg | ORAL_TABLET | Freq: Two times a day (BID) | ORAL | 5 refills | Status: DC

## 2022-08-10 NOTE — Telephone Encounter (Signed)
Received message from Dr Rhea Belton about pt experiencing recurrent gout flare to L ankle - will restart prednisone taper, continue daily allopurinol 100mg , continue colchicine 1 tab every day PRN.  Plz call to schedule OV with me - ok to place on Friday at 4pm.

## 2022-08-10 NOTE — Telephone Encounter (Signed)
Patient has been scheduled

## 2022-08-10 NOTE — Telephone Encounter (Signed)
LMTCB

## 2022-08-10 NOTE — Progress Notes (Signed)
Subjective:    Patient ID: Chad [name], male    DOB: 22-Aug-1955, 67 y.o.   MRN: [id number]  HPI Chad [name] is a 67 year old male with a history of MASH cirrhosis with splenomegaly, prior gastric stapling surgery in the 80s, cholecystectomy, CAD with prior PCI, diabetic gastroparesis, diabetes, colonic polyps, gout who is here for follow-up.  He is here today with his wife and was last seen on 02/01/2022.  His biggest complaint today is he has developed recurrent gout flare in his left ankle.  This is severely painful and started again last night.  Has been dealing with this on and off for about a month.  Was started on allopurinol 100 mg and is also on colchicine.  Has been taking colchicine once a day.  He has tried to modify his diet to avoid gout flare.  From abdominal perspective he is benefiting from the metoclopramide.  Without this he has upper abdominal bloating and nausea.  He is using Dexilant 60 mg in the morning and Pepcid 20 mg at bedtime.  His heartburn and reflux are well-controlled.  He has not had any recent dysphagia.  Bowel movements are every 1 to 2 days and can be hard at times.  No rectal bleeding or melena.  No abdominal pain.  No increasing abdominal girth or lower extremity edema.  No confusion per his wife.   Review of Systems As per HPI, otherwise negative  Current Medications, Allergies, Past Medical History, Past Surgical History, Family History and Social History were reviewed in [employer] record.    Objective:   Physical Exam BP 110/68   Pulse 77   Ht 5\' 9"  (1.753 m)   Wt 252 lb (114.3 kg)   SpO2 98%   BMI 37.21 kg/m  Gen: awake, alert, NAD HEENT: anicteric  CV: RRR, no mrg Pulm: CTA b/l Abd: soft, NT/ND, +BS throughout Ext: Red and swollen left ankle particularly the lateral malleolus, tender to touch, no significant lower extremity edema Neuro: nonfocal, no asterixis     Latest Ref Rng & Units 07/25/2022   11:19 AM  02/01/2022   11:48 AM 09/15/2021    2:56 PM  CBC  WBC 4.0 - 10.5 K/uL 9.4  7.0  6.2   Hemoglobin 13.0 - 17.0 g/dL 40.9  81.1  91.4   Hematocrit 39.0 - 52.0 % 51.4  50.5  45.8   Platelets 150.0 - 400.0 K/uL 162.0  177.0  144.0    CMP     Component Value Date/Time   NA 136 07/25/2022 1119   NA 139 08/04/2010 0000   K 3.7 07/25/2022 1119   K 4.4 08/04/2010 0000   CL 96 07/25/2022 1119   CO2 27 07/25/2022 1119   GLUCOSE 108 (H) 07/25/2022 1119   BUN 20 07/25/2022 1119   CREATININE 1.09 07/25/2022 1119   CREATININE 1.11 10/26/2018 0909   CALCIUM 9.5 07/25/2022 1119   PROT 7.0 07/25/2022 1119   ALBUMIN 4.8 07/25/2022 1119   AST 43 (H) 07/25/2022 1119   AST 47 08/04/2010 0000   ALT 48 07/25/2022 1119   ALT 28 08/03/2015 1205   ALKPHOS 159 (H) 07/25/2022 1119   BILITOT 1.8 (H) 07/25/2022 1119   GFR 70.28 07/25/2022 1119   GFRNONAA >60 05/17/2018 1530   Lab Results  Component Value Date   INR 1.1 (H) 07/25/2022   INR 1.1 (H) 02/01/2022   INR 1.1 (H) 06/18/2021   MELD 3.0: 11 at 07/25/2022 11:19 AM  MELD-Na: 11 at 07/25/2022 11:19 AM Calculated from: Serum Creatinine: 1.09 mg/dL at 02/26/1094 04:54 AM Serum Sodium: 136 mEq/L at 07/25/2022 11:19 AM Total Bilirubin: 1.8 mg/dL at 0/10/8117 14:78 AM Serum Albumin: 4.8 g/dL (Using max of 3.5 g/dL) at 04/02/5619 30:86 AM INR(ratio): 1.1 ratio at 07/25/2022 11:19 AM Age at listing (hypothetical): 5 years Sex: Male at 07/25/2022 11:19 AM   MRI ABDOMEN WITHOUT AND WITH CONTRAST   TECHNIQUE: Multiplanar multisequence MR imaging of the abdomen was performed both before and after the administration of intravenous contrast.   CONTRAST:  10mL GADAVIST GADOBUTROL 1 MMOL/ML IV SOLN   COMPARISON:  Multiple priors including most recent MRI December 11, 2020.   FINDINGS: Lower chest: No acute abnormality.   Hepatobiliary: Cirrhotic hepatic morphology. No arterially enhancing hepatic lesion. No significant hepatic steatosis.  Gallbladder surgically absent. No biliary ductal dilation.   Pancreas: No pancreatic ductal dilation or evidence of acute inflammation. Unchanged size of the 17 mm nodule in the pancreatic tail which follows spleen on all pulse sequences compatible with a splenule.   Spleen: Splenomegaly measuring 17.6 cm in maximum craniocaudal dimension.   Adrenals/Urinary Tract: Stable benign 2 cm macroscopic fat containing right adrenal myelolipoma, requires no imaging follow-up. Left adrenal gland appears normal. Hydronephrosis. No suspicious renal mass.   Stomach/Bowel: Stable surgical changes along the lesser curvature of the stomach. No evidence of bowel obstruction.   Vascular/Lymphatic: Normal caliber abdominal aorta. Smooth IVC contours. The portal, splenic and superior mesenteric veins are patent. No pathologically enlarged abdominal lymph nodes.   Other:  No significant abdominopelvic free fluid.   Musculoskeletal: No suspicious bone lesions identified.   IMPRESSION: 1. Cirrhotic hepatic morphology without suspicious hepatic lesion. 2. Stable splenomegaly.     Electronically Signed   By: Chad [name] M.D.   On: 01/31/2022 16:57     Assessment & Plan:  67 year old male with a history of MASH cirrhosis with splenomegaly, prior gastric stapling surgery in the 80s, cholecystectomy, CAD with prior PCI, diabetic gastroparesis, diabetes, colonic polyps, gout who is here for follow-up.   MASH cirrhosis/portal hypertension/splenomegaly --his liver disease has remained compensated.  His MELD score is relatively low at 11.  We discussed EGD for surveillance but I recommend that we change his metoprolol to carvedilol which would prevent the need for variceal screening.  I discussed this with Dr. Sharen Hones by secure chat today and he is okay making this change.  He is up-to-date with HCC screening.  Recent lab work as above -- Discontinue metoprolol -- Begin carvedilol 3.125 mg twice  daily; as long as he maintains nonselective beta-blocker he does not need EGD for variceal screening unless there is significant decompensation in his liver disease -- HCC screening up-to-date by MRI, repeat December 2024 --Continue low-sodium diet --He is on Bumex and spironolactone per cardiology  2.  GERD/diabetic gastroparesis --stable on medication.  We reviewed the risks of long-term metoclopramide and he wishes to continue this medicine as it is definitively effective --Dexilant 60 mg each morning --Famotidine 20 mg at bedtime --Metoclopramide 5 to 10 mg 3 times daily before meals as needed; reminded to use lowest effective dose  3.  History of colonic polyps --surveillance colonoscopy recommended around December 2026  4.  Gout flare --discussed by secure chat with Dr. Sharen Hones.  He will prescribe prednisone taper and advised the patient to continue current dose of allopurinol colchicine.  He will follow-up with the patient later this week and urgent visit  75-month follow-up with GI  40 minutes total spent today including patient facing time, coordination of care, reviewing medical history/procedures/pertinent radiology studies, and documentation of the encounter.

## 2022-08-10 NOTE — Patient Instructions (Addendum)
Continue Dexilant, Pepcid and Reglan.   Stop metoprolol.  We have sent the following medications to your pharmacy for you to pick up at your convenience: carvedilol.  Per Dr. Sharen Hones:  Start prednisone taper prescribed by pcp. Continue allopurinol 100 mg daily and Colchicine 1 tablet daily as needed. Please call Dr. Sharen Hones office for an appointment for this week.  _______________________________________________________  If your blood pressure at your visit was 140/90 or greater, please contact your primary care physician to follow up on this.  _______________________________________________________  If you are age 67 or older, your body mass index should be between 23-30. Your Body mass index is 37.21 kg/m. If this is out of the aforementioned range listed, please consider follow up with your Primary Care Provider.  If you are age 24 or younger, your body mass index should be between 19-25. Your Body mass index is 37.21 kg/m. If this is out of the aformentioned range listed, please consider follow up with your Primary Care Provider.   ________________________________________________________  The [facility] providers would like to encourage you to use [facility] to communicate with providers for non-urgent requests or questions.  Due to long hold times on the telephone, sending your provider a message by [facility] may be a faster and more efficient way to get a response.  Please allow 48 business hours for a response.  Please remember that this is for non-urgent requests.  _______________________________________________________

## 2022-08-10 NOTE — Telephone Encounter (Signed)
Noted  

## 2022-08-12 ENCOUNTER — Encounter: Payer: Self-pay | Admitting: Family Medicine

## 2022-08-12 ENCOUNTER — Ambulatory Visit (INDEPENDENT_AMBULATORY_CARE_PROVIDER_SITE_OTHER): Payer: Medicare Other | Admitting: Family Medicine

## 2022-08-12 VITALS — BP 134/72 | HR 70 | Temp 97.3°F | Ht 69.0 in | Wt 249.0 lb

## 2022-08-12 DIAGNOSIS — M109 Gout, unspecified: Secondary | ICD-10-CM | POA: Diagnosis not present

## 2022-08-12 DIAGNOSIS — K7460 Unspecified cirrhosis of liver: Secondary | ICD-10-CM | POA: Diagnosis not present

## 2022-08-12 DIAGNOSIS — E1142 Type 2 diabetes mellitus with diabetic polyneuropathy: Secondary | ICD-10-CM | POA: Diagnosis not present

## 2022-08-12 DIAGNOSIS — Z7985 Long-term (current) use of injectable non-insulin antidiabetic drugs: Secondary | ICD-10-CM

## 2022-08-12 DIAGNOSIS — E113559 Type 2 diabetes mellitus with stable proliferative diabetic retinopathy, unspecified eye: Secondary | ICD-10-CM

## 2022-08-12 DIAGNOSIS — K7581 Nonalcoholic steatohepatitis (NASH): Secondary | ICD-10-CM

## 2022-08-12 DIAGNOSIS — I5032 Chronic diastolic (congestive) heart failure: Secondary | ICD-10-CM | POA: Diagnosis not present

## 2022-08-12 DIAGNOSIS — R0989 Other specified symptoms and signs involving the circulatory and respiratory systems: Secondary | ICD-10-CM

## 2022-08-12 MED ORDER — SEMAGLUTIDE (1 MG/DOSE) 4 MG/3ML ~~LOC~~ SOPN: 1.0000 mg | PEN_INJECTOR | SUBCUTANEOUS | 2 refills | Status: DC

## 2022-08-12 NOTE — Progress Notes (Unsigned)
Ph: [phone] Fax: [phone]   Patient ID: Chad [name], male    DOB: [birth year], 67 y.o.   MRN: [id number]  This visit was conducted in person.  BP 134/72   Pulse 70   Temp (!) 97.3 F (36.3 C) (Temporal)   Ht 5\' 9"  (1.753 m)   Wt 249 lb (112.9 kg)   SpO2 98%   BMI 36.77 kg/m    CC: L gout flare Subjective:   HPI: Chad [name] is a 67 y.o. male presenting on 08/12/2022 for Medical Management of Chronic Issues (C/o L foot gout flare. Pt accompanied by wife, Olegario Messier. )   Saw Dr Rhea Belton on Wednesday - reassuring period from GI standpoint - rec start carvedilol 3.125mg  BID in place of metoprolol to avoid need for rpt variceal screening.   Recurrent gout flare to left ankle - started 08/09/2022. This was despite recently starting allopurinol 100mg  daily. He has been taking colchicine 0.6mg  daily PRN - has been using regularly since gout flare. Only eating chicken. Some redness and pain to left anterior shin. No fever.   Previous prolonged gout flare to L lateral achilles insertion (enthesitis). Had previously received 2 prednisone tapers 05/2022 and 06/2022.   Next cardiology appt is Sept 2024.  Lab Results  Component Value Date   HGBA1C 6.5 07/25/2022  Requests 3 mo supply of Ozempic - this was started in place of [facility] due to shortage.  Notes decreased sensation to feet - has cut skin when cutting toenails.  Due for diabetic eye exam.      Relevant past medical, surgical, family and social history reviewed and updated as indicated. Interim medical history since our last visit reviewed. Allergies and medications reviewed and updated. Outpatient Medications Prior to Visit  Medication Sig Dispense Refill   allopurinol (ZYLOPRIM) 100 MG tablet Take 1 tablet (100 mg total) by mouth daily. Increase as directed 90 tablet 3   aspirin 81 MG chewable tablet Chew 81 mg by mouth daily.     atorvastatin (LIPITOR) 20 MG tablet Take 1 tablet (20 mg total) by mouth daily. 90 tablet  4   bumetanide (BUMEX) 1 MG tablet Take 3 tablets (3 mg total) by mouth daily.     carvedilol (COREG) 3.125 MG tablet Take 1 tablet (3.125 mg total) by mouth 2 (two) times daily with a meal. 60 tablet 5   citalopram (CELEXA) 20 MG tablet Take 1 tablet (20 mg total) by mouth daily. 90 tablet 4   colchicine 0.6 MG tablet Take 1 tablet (0.6 mg total) by mouth daily as needed (gout flare). May take 2 tablets on first day of gout flare 60 tablet 0   dapagliflozin propanediol (FARXIGA) 10 MG TABS tablet Take 10 mg by mouth daily before breakfast.     dexlansoprazole (DEXILANT) 60 MG capsule Take 1 capsule (60 mg total) by mouth daily. 90 capsule 4   famotidine (PEPCID) 20 MG tablet Take 1 tablet (20 mg total) by mouth at bedtime. 90 tablet 4   ferrous sulfate 325 (65 FE) MG EC tablet Take 1 tablet (325 mg total) by mouth every other day.     fluticasone (FLONASE) 50 MCG/ACT nasal spray Place 2 sprays into both nostrils daily. 16 g 6   levothyroxine (SYNTHROID) 125 MCG tablet TAKE ONE TAB BY MOUTH ONCE DAILY. TAKE ON AN EMPTY STOMACH WITH A GLASS OF WATER ATLEAST 30-60 MINUTES BEFORE BREAKFAST 90 tablet 4   Magnesium 400 MG TABS Take 400 mg by  mouth 2 (two) times daily.     metoCLOPramide (REGLAN) 5 MG tablet TAKE ONE TABLET BY MOUTH THREE TIMES DAILY BEFORE MEALS (NEED APPT FOR FUTURE REFILLS) 90 tablet 2   metolazone (ZAROXOLYN) 2.5 MG tablet Take 1 tablet (2.5 mg total) by mouth once a week. As needed for fluid/swelling     Multiple Vitamin (MULITIVITAMIN WITH MINERALS) TABS Take 1 tablet by mouth daily.     nitroGLYCERIN (NITROSTAT) 0.4 MG SL tablet Place 1 tablet (0.4 mg total) under the tongue every 5 (five) minutes as needed for chest pain. 60 tablet 3   potassium chloride (KLOR-CON) 10 MEQ tablet TAKE ONE TABLET BY MOUTH TWICE A DAY 180 tablet 3   predniSONE (DELTASONE) 20 MG tablet Take two tablets daily for 4 days followed by one tablet daily for 4 days 12 tablet 0   spironolactone (ALDACTONE)  25 MG tablet Take 25 mg by mouth daily.     VENTOLIN HFA 108 (90 Base) MCG/ACT inhaler INHALE 2 PUFFS INTO THE LUNGS EVERY 6 HOURS AS NEEDED FOR WHEEZING OR SHORTNESS OF BREATH 18 g 5   vitamin B-12 (CYANOCOBALAMIN) 500 MCG tablet Take 1 tablet (500 mcg total) by mouth daily.     vitamin E 400 UNIT capsule Take 400 Units by mouth daily.     Semaglutide, 1 MG/DOSE, 4 MG/3ML SOPN Inject 1 mg as directed once a week. 3 mL 5   Semaglutide, 1 MG/DOSE, 4 MG/3ML SOPN Inject 1 mg as directed once a week. 3 mL 5   mometasone (ELOCON) 0.1 % cream Apply to tender area on left ear QD-BID PRN up to 5d/wk 45 g 0   No facility-administered medications prior to visit.     Per HPI unless specifically indicated in ROS section below Review of Systems  Objective:  BP 134/72   Pulse 70   Temp (!) 97.3 F (36.3 C) (Temporal)   Ht 5\' 9"  (1.753 m)   Wt 249 lb (112.9 kg)   SpO2 98%   BMI 36.77 kg/m   Wt Readings from Last 3 Encounters:  08/12/22 249 lb (112.9 kg)  08/10/22 252 lb (114.3 kg)  07/25/22 245 lb (111.1 kg)      Physical Exam Vitals and nursing note reviewed.  Constitutional:      Appearance: Normal appearance. He is not ill-appearing.  Musculoskeletal:        General: Swelling and tenderness present.     Right lower leg: No edema.     Left lower leg: No edema.     Comments:  Point tender swelling with redness and warmth to lateral left posterior heel at insertion of achilles tendon into calcaneus  Diminished pedal pulses   Skin:    General: Skin is warm and dry.     Findings: Erythema present. No rash.     Comments: Dry skin to bilateral heels  Neurological:     Mental Status: He is alert.  Psychiatric:        Mood and Affect: Mood normal.        Behavior: Behavior normal.       Results for orders placed or performed in visit on 07/25/22  Microalbumin / creatinine urine ratio  Result Value Ref Range   Microalb, Ur <0.7 0.0 - 1.9 mg/dL   Creatinine,U 16.1 mg/dL   Microalb  Creat Ratio 3.9 0.0 - 30.0 mg/g  Hemoglobin A1c  Result Value Ref Range   Hgb A1c MFr Bld 6.5 4.6 - 6.5 %  Uric acid  Result Value Ref Range   Uric Acid, Serum 7.9 (H) 4.0 - 7.8 mg/dL  PSA, Medicare  Result Value Ref Range   PSA 2.13 0.10 - 4.00 ng/ml  Cortisol-am, blood  Result Value Ref Range   Cortisol - AM 12.8 mcg/dL  Lipid panel  Result Value Ref Range   Cholesterol 126 0 - 200 mg/dL   Triglycerides 409.8 0.0 - 149.0 mg/dL   HDL 11.91 >47.82 mg/dL   VLDL 95.6 0.0 - 21.3 mg/dL   LDL Cholesterol 53 0 - 99 mg/dL   Total CHOL/HDL Ratio 3    NonHDL 80.47   Comprehensive metabolic panel  Result Value Ref Range   Sodium 136 135 - 145 mEq/L   Potassium 3.7 3.5 - 5.1 mEq/L   Chloride 96 96 - 112 mEq/L   CO2 27 19 - 32 mEq/L   Glucose, Bld 108 (H) 70 - 99 mg/dL   BUN 20 6 - 23 mg/dL   Creatinine, Ser 0.86 0.40 - 1.50 mg/dL   Total Bilirubin 1.8 (H) 0.2 - 1.2 mg/dL   Alkaline Phosphatase 159 (H) 39 - 117 U/L   AST 43 (H) 0 - 37 U/L   ALT 48 0 - 53 U/L   Total Protein 7.0 6.0 - 8.3 g/dL   Albumin 4.8 3.5 - 5.2 g/dL   GFR 57.84 >69.62 mL/min   Calcium 9.5 8.4 - 10.5 mg/dL  TSH  Result Value Ref Range   TSH 3.93 0.35 - 5.50 uIU/mL  Protime-INR  Result Value Ref Range   INR 1.1 (H) 0.8 - 1.0 ratio   Prothrombin Time 11.7 9.6 - 13.1 sec  CBC with Differential/Platelet  Result Value Ref Range   WBC 9.4 4.0 - 10.5 K/uL   RBC 5.87 (H) 4.22 - 5.81 Mil/uL   Hemoglobin 17.2 (H) 13.0 - 17.0 g/dL   HCT 95.2 84.1 - 32.4 %   MCV 87.7 78.0 - 100.0 fl   MCHC 33.5 30.0 - 36.0 g/dL   RDW 40.1 02.7 - 25.3 %   Platelets 162.0 150.0 - 400.0 K/uL   Neutrophils Relative % 78.5 (H) 43.0 - 77.0 %   Lymphocytes Relative 11.2 (L) 12.0 - 46.0 %   Monocytes Relative 9.4 3.0 - 12.0 %   Eosinophils Relative 0.5 0.0 - 5.0 %   Basophils Relative 0.4 0.0 - 3.0 %   Neutro Abs 7.3 1.4 - 7.7 K/uL   Lymphs Abs 1.1 0.7 - 4.0 K/uL   Monocytes Absolute 0.9 0.1 - 1.0 K/uL   Eosinophils Absolute 0.0  0.0 - 0.7 K/uL   Basophils Absolute 0.0 0.0 - 0.1 K/uL  Ferritin  Result Value Ref Range   Ferritin 247.8 22.0 - 322.0 ng/mL  IBC panel  Result Value Ref Range   Iron 139 42 - 165 ug/dL   Transferrin 664.4 034.7 - 360.0 mg/dL   Saturation Ratios 42.5 20.0 - 50.0 %   TIBC 308.0 250.0 - 450.0 mcg/dL  Vitamin Z56  Result Value Ref Range   Vitamin B-12 854 211 - 911 pg/mL   *Note: Due to a large number of results and/or encounters for the requested time period, some results have not been displayed. A complete set of results can be found in Results Review.    Assessment & Plan:   Problem List Items Addressed This Visit     Diabetes mellitus type 2 with retinopathy (HCC)    Chronic, good control to date.  Requests 63mo Ozempic supply - sent  in.  Replacement due to [facility] shortage. Continue Ozempic which has more cardiovascular protection than tirzepatide.       Relevant Medications   Semaglutide, 1 MG/DOSE, 4 MG/3ML SOPN   Chronic diastolic CHF (congestive heart failure) (HCC)    Sees [facility] cardiology.  GI recommended BB change from metoprolol to carvedilol - he will let cardiology know.       Liver cirrhosis secondary to NASH [facility])    Appreciate GI care. Stable period. BB changed from metoprolol to carvedilol - he will not need further esophageal variceal screening as long as stays on non-selective beta blocker.      Diabetic neuropathy associated with type 2 diabetes mellitus (HCC)    Notes he has cut skin of toes when trimming toenails due to diminished sensation. Wife will start cutting toes. Offered podiatry referral - declines for now.       Relevant Medications   Semaglutide, 1 MG/DOSE, 4 MG/3ML SOPN   Diminished pulses in lower extremity    ABIs 02/2022 - WNL (abnormal R toe waveforms)      Gout attack - Primary    3rd episode of enthesitis to left posterior achilles tendon insertion into heel. Presumed gout related as does respond to prednisone each time.   Allopurinol started 07/24/2022 (urate was 7.9). Latest flare developed 08/09/2022.  He stopped allopurinol. Advised restart this as we've also restarted prednisone taper.  Continue prednisone taper x 8 days, update if recurrence after completed  Continue colchicine 0.6mg  daily until gout flare has resolved.  No noted diet changes to cause trigger. Not on medication that should trigger gout.   ADDENDUM ==> noted colchicine / carvedilol interaction - recommend drop colchicine dose to every other day PRN gout flare. See phone note.         Meds ordered this encounter  Medications   Semaglutide, 1 MG/DOSE, 4 MG/3ML SOPN    Sig: Inject 1 mg as directed once a week.    Dispense:  9 mL    Refill:  2    No orders of the defined types were placed in this encounter.   Patient Instructions  Continue allopurinol 100mg  daily. Continue this regardless of gout flares.  Continue colchicine 1 daily as needed for gout flare. May stop once pain is resolved.  Finish prednisone course. Let us know if pain returns.  Let me know if you'd like to see foot doctor (podiatrist)  Schedule follow up visit in December  Follow up plan: Return if symptoms worsen or fail to improve.  Eustaquio Boyden, MD

## 2022-08-12 NOTE — Patient Instructions (Addendum)
Continue allopurinol 100mg  daily. Continue this regardless of gout flares.  Continue colchicine 1 daily as needed for gout flare. May stop once pain is resolved.  Finish prednisone course. Let us know if pain returns.  Let me know if you'd like to see foot doctor (podiatrist)  Schedule follow up visit in December

## 2022-08-13 ENCOUNTER — Telehealth: Payer: Self-pay | Admitting: Family Medicine

## 2022-08-13 NOTE — Assessment & Plan Note (Signed)
[facility] cardiology.  GI recommended BB change from metoprolol to carvedilol - he will let cardiology know.

## 2022-08-13 NOTE — Telephone Encounter (Signed)
Plz notify patient: Colchicine / carvedilol interact. Carvedilol increases colchicine effect and can worsen colchicine side effects - recommend drop colchicine dose to every other day PRN gout flare pain.

## 2022-08-13 NOTE — Assessment & Plan Note (Signed)
Appreciate GI care. Stable period. BB changed from metoprolol to carvedilol - he will not need further esophageal variceal screening as long as stays on non-selective beta blocker.

## 2022-08-13 NOTE — Assessment & Plan Note (Signed)
ABIs 02/2022 - WNL (abnormal R toe waveforms)

## 2022-08-13 NOTE — Assessment & Plan Note (Signed)
Notes he has cut skin of toes when trimming toenails due to diminished sensation. Wife will start cutting toes. Offered podiatry referral - declines for now.

## 2022-08-13 NOTE — Assessment & Plan Note (Addendum)
Chronic, good control to date.  Requests 33mo Ozempic supply - sent in.  Replacement due to [facility] shortage. Continue Ozempic which has more cardiovascular protection than tirzepatide.

## 2022-08-13 NOTE — Assessment & Plan Note (Addendum)
3rd episode of enthesitis to left posterior achilles tendon insertion into heel. Presumed gout related as does respond to prednisone each time.  Allopurinol started 07/24/2022 (urate was 7.9). Latest flare developed 08/09/2022.  He stopped allopurinol. Advised restart this as we've also restarted prednisone taper.  Continue prednisone taper x 8 days, update if recurrence after completed  Continue colchicine 0.6mg  daily until gout flare has resolved.  No noted diet changes to cause trigger. Not on medication that should trigger gout.   ADDENDUM ==> noted colchicine / carvedilol interaction - recommend drop colchicine dose to every other day PRN gout flare. See phone note.

## 2022-08-15 NOTE — Telephone Encounter (Signed)
Spoke with pt's wife, Olegario Messier (on dpr), relaying Dr. Timoteo Expose message. She verbalizes understanding and will inform pt.

## 2022-08-24 ENCOUNTER — Other Ambulatory Visit (HOSPITAL_COMMUNITY): Payer: Self-pay

## 2022-08-31 ENCOUNTER — Other Ambulatory Visit: Payer: Self-pay | Admitting: Nurse Practitioner

## 2022-09-06 ENCOUNTER — Other Ambulatory Visit: Payer: Self-pay | Admitting: Family Medicine

## 2022-09-06 DIAGNOSIS — M109 Gout, unspecified: Secondary | ICD-10-CM

## 2022-09-06 NOTE — Telephone Encounter (Signed)
Colchicine Last filled:  07/08/22, #60 Last OV:  08/12/22, acute gout of L ankle Next OV:  02/13/23, 6 mo f/u

## 2022-09-14 ENCOUNTER — Other Ambulatory Visit: Payer: Self-pay | Admitting: Family Medicine

## 2022-09-14 DIAGNOSIS — M109 Gout, unspecified: Secondary | ICD-10-CM

## 2022-09-14 NOTE — Telephone Encounter (Signed)
Too soon. Rx sent 09/07/22, #60/0 to [facility].  Request denied.

## 2022-11-01 DIAGNOSIS — I10 Essential (primary) hypertension: Secondary | ICD-10-CM | POA: Diagnosis not present

## 2022-11-01 DIAGNOSIS — R0602 Shortness of breath: Secondary | ICD-10-CM | POA: Diagnosis not present

## 2022-11-01 DIAGNOSIS — R079 Chest pain, unspecified: Secondary | ICD-10-CM | POA: Diagnosis not present

## 2022-11-01 DIAGNOSIS — I2510 Atherosclerotic heart disease of native coronary artery without angina pectoris: Secondary | ICD-10-CM | POA: Diagnosis not present

## 2022-11-02 ENCOUNTER — Other Ambulatory Visit: Payer: Self-pay | Admitting: Nurse Practitioner

## 2022-11-02 ENCOUNTER — Other Ambulatory Visit: Payer: Self-pay | Admitting: Family Medicine

## 2022-11-02 DIAGNOSIS — E039 Hypothyroidism, unspecified: Secondary | ICD-10-CM

## 2022-11-08 ENCOUNTER — Ambulatory Visit (INDEPENDENT_AMBULATORY_CARE_PROVIDER_SITE_OTHER): Payer: Medicare Other

## 2022-11-08 DIAGNOSIS — R079 Chest pain, unspecified: Secondary | ICD-10-CM | POA: Diagnosis not present

## 2022-11-08 DIAGNOSIS — I2510 Atherosclerotic heart disease of native coronary artery without angina pectoris: Secondary | ICD-10-CM | POA: Diagnosis not present

## 2022-11-08 DIAGNOSIS — Z23 Encounter for immunization: Secondary | ICD-10-CM | POA: Diagnosis not present

## 2022-11-08 DIAGNOSIS — I10 Essential (primary) hypertension: Secondary | ICD-10-CM | POA: Diagnosis not present

## 2022-11-08 DIAGNOSIS — R0602 Shortness of breath: Secondary | ICD-10-CM | POA: Diagnosis not present

## 2022-11-09 ENCOUNTER — Ambulatory Visit: Payer: Medicare Other

## 2022-11-24 DIAGNOSIS — Z981 Arthrodesis status: Secondary | ICD-10-CM | POA: Diagnosis not present

## 2022-11-24 DIAGNOSIS — M542 Cervicalgia: Secondary | ICD-10-CM | POA: Diagnosis not present

## 2022-11-24 DIAGNOSIS — M5412 Radiculopathy, cervical region: Secondary | ICD-10-CM | POA: Diagnosis not present

## 2022-12-22 DIAGNOSIS — M2578 Osteophyte, vertebrae: Secondary | ICD-10-CM | POA: Diagnosis not present

## 2022-12-22 DIAGNOSIS — M5412 Radiculopathy, cervical region: Secondary | ICD-10-CM | POA: Diagnosis not present

## 2022-12-22 DIAGNOSIS — M50122 Cervical disc disorder at C5-C6 level with radiculopathy: Secondary | ICD-10-CM | POA: Diagnosis not present

## 2022-12-22 DIAGNOSIS — M4802 Spinal stenosis, cervical region: Secondary | ICD-10-CM | POA: Diagnosis not present

## 2022-12-29 ENCOUNTER — Other Ambulatory Visit: Payer: Self-pay | Admitting: Family Medicine

## 2022-12-29 DIAGNOSIS — M109 Gout, unspecified: Secondary | ICD-10-CM

## 2023-01-10 DIAGNOSIS — M4802 Spinal stenosis, cervical region: Secondary | ICD-10-CM | POA: Diagnosis not present

## 2023-01-17 ENCOUNTER — Other Ambulatory Visit: Payer: Self-pay | Admitting: Internal Medicine

## 2023-01-17 ENCOUNTER — Other Ambulatory Visit: Payer: Self-pay | Admitting: Nurse Practitioner

## 2023-01-17 DIAGNOSIS — M542 Cervicalgia: Secondary | ICD-10-CM | POA: Diagnosis not present

## 2023-01-17 DIAGNOSIS — M5412 Radiculopathy, cervical region: Secondary | ICD-10-CM | POA: Diagnosis not present

## 2023-01-27 ENCOUNTER — Other Ambulatory Visit: Payer: Self-pay | Admitting: Family Medicine

## 2023-01-27 NOTE — Telephone Encounter (Signed)
Last OV:  08/12/22, gout flare Next OV:  02/13/23, 6 mo f/u

## 2023-01-27 NOTE — Telephone Encounter (Signed)
Denied. Request should go to prescribing cardiologist Dr Tamera Reason.

## 2023-01-31 ENCOUNTER — Ambulatory Visit (INDEPENDENT_AMBULATORY_CARE_PROVIDER_SITE_OTHER): Payer: Medicare Other | Admitting: Internal Medicine

## 2023-01-31 ENCOUNTER — Encounter: Payer: Self-pay | Admitting: Internal Medicine

## 2023-01-31 VITALS — BP 112/66 | HR 71 | Temp 98.4°F | Ht 69.0 in | Wt 262.0 lb

## 2023-01-31 DIAGNOSIS — J0110 Acute frontal sinusitis, unspecified: Secondary | ICD-10-CM | POA: Insufficient documentation

## 2023-01-31 DIAGNOSIS — J019 Acute sinusitis, unspecified: Secondary | ICD-10-CM | POA: Insufficient documentation

## 2023-01-31 MED ORDER — AMOXICILLIN-POT CLAVULANATE 875-125 MG PO TABS
1.0000 | ORAL_TABLET | Freq: Two times a day (BID) | ORAL | 0 refills | Status: DC
Start: 2023-01-31 — End: 2023-02-13

## 2023-01-31 MED ORDER — BENZONATATE 200 MG PO CAPS: 200.0000 mg | ORAL_CAPSULE | Freq: Three times a day (TID) | ORAL | 0 refills | Status: DC | PRN
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
## 2023-01-31 NOTE — Assessment & Plan Note (Signed)
Sick over a week Discussed therapeutic tylenol dosing Will treat with augmentin 875 bid x 7 days and prn benzonatate

## 2023-02-03 ENCOUNTER — Telehealth: Payer: Self-pay

## 2023-02-03 ENCOUNTER — Other Ambulatory Visit: Payer: Self-pay

## 2023-02-03 DIAGNOSIS — K7460 Unspecified cirrhosis of liver: Secondary | ICD-10-CM

## 2023-02-03 NOTE — Telephone Encounter (Signed)
-----   Message from Nurse Kerrie Buffalo sent at 02/02/2022  2:53 PM EST ----- Regarding: MR abd w w/o Pt needs repeat MRI in 1 yr

## 2023-02-03 NOTE — Telephone Encounter (Signed)
Order in epic, rad scheduling to contact pt to set up the appt.

## 2023-02-09 ENCOUNTER — Other Ambulatory Visit (HOSPITAL_COMMUNITY): Payer: Medicare Other

## 2023-02-13 ENCOUNTER — Ambulatory Visit (INDEPENDENT_AMBULATORY_CARE_PROVIDER_SITE_OTHER): Payer: Medicare Other | Admitting: Family Medicine

## 2023-02-13 ENCOUNTER — Encounter: Payer: Self-pay | Admitting: Family Medicine

## 2023-02-13 VITALS — BP 128/70 | HR 70 | Temp 97.7°F | Ht 69.0 in | Wt 258.1 lb

## 2023-02-13 DIAGNOSIS — R5383 Other fatigue: Secondary | ICD-10-CM | POA: Diagnosis not present

## 2023-02-13 DIAGNOSIS — I2510 Atherosclerotic heart disease of native coronary artery without angina pectoris: Secondary | ICD-10-CM

## 2023-02-13 DIAGNOSIS — K7460 Unspecified cirrhosis of liver: Secondary | ICD-10-CM

## 2023-02-13 DIAGNOSIS — M5030 Other cervical disc degeneration, unspecified cervical region: Secondary | ICD-10-CM

## 2023-02-13 DIAGNOSIS — E113559 Type 2 diabetes mellitus with stable proliferative diabetic retinopathy, unspecified eye: Secondary | ICD-10-CM

## 2023-02-13 DIAGNOSIS — M1A9XX0 Chronic gout, unspecified, without tophus (tophi): Secondary | ICD-10-CM | POA: Diagnosis not present

## 2023-02-13 DIAGNOSIS — Z7985 Long-term (current) use of injectable non-insulin antidiabetic drugs: Secondary | ICD-10-CM

## 2023-02-13 DIAGNOSIS — E1169 Type 2 diabetes mellitus with other specified complication: Secondary | ICD-10-CM

## 2023-02-13 DIAGNOSIS — Z7984 Long term (current) use of oral hypoglycemic drugs: Secondary | ICD-10-CM | POA: Diagnosis not present

## 2023-02-13 DIAGNOSIS — K7581 Nonalcoholic steatohepatitis (NASH): Secondary | ICD-10-CM | POA: Diagnosis not present

## 2023-02-13 DIAGNOSIS — R5381 Other malaise: Secondary | ICD-10-CM | POA: Diagnosis not present

## 2023-02-13 DIAGNOSIS — I5032 Chronic diastolic (congestive) heart failure: Secondary | ICD-10-CM

## 2023-02-13 DIAGNOSIS — K7469 Other cirrhosis of liver: Secondary | ICD-10-CM

## 2023-02-13 LAB — POCT GLYCOSYLATED HEMOGLOBIN (HGB A1C): Hemoglobin A1C: 6.3 % — AB (ref 4.0–5.6)

## 2023-02-13 MED ORDER — SEMAGLUTIDE (2 MG/DOSE) 8 MG/3ML ~~LOC~~ SOPN: 2.0000 mg | PEN_INJECTOR | SUBCUTANEOUS | 1 refills | Status: DC

## 2023-02-13 NOTE — Patient Instructions (Addendum)
You should be taking carvedilol 3.125mg  twice daily, instead of metoprolol.  Call to schedule diabetic eye exam at [facility] eye.  Try continuous glucose monitor - sample provided today  For gout - continue allopurinol 100mg  daily for gout prevention. Use Colchicine every other day only as needed when you get a gout flare.  Good to see you today. Continue current medicines.  Return in 3 months for follow up visit

## 2023-02-13 NOTE — Progress Notes (Unsigned)
Ph: [phone] Fax: [phone]   Patient ID: Chad [name], male    DOB: [birth year], 67 y.o.   MRN: [id number]  This visit was conducted in person.  BP 128/70   Pulse 70   Temp 97.7 F (36.5 C) (Oral)   Ht 5\' 9"  (1.753 m)   Wt 258 lb 2 oz (117.1 kg)   SpO2 97%   BMI 38.12 kg/m    CC: 6 mo DM f/u visit  Subjective:   HPI: Chad [name] is a 67 y.o. male presenting on 02/13/2023 for Medical Management of Chronic Issues (Here for 6 mo f/u. Pt accompanied by wife, Olegario Messier. )   Liver cirrhosis due to NASH - sees Dr Rhea Belton. BB changed from metoprolol to carvedilol. No further need for esoph variceal screening if stays on non-selective beta blocker. Planned upcoming MRI tomorrow.   Sees [facility] cardiology Dr Jaymes Graff- started imdur 30mg  daily. Need to verify they're aware of BB carvedilol not metoprolol.   Cervical spinal stenosis with radiculopathy - sees [facility] neurosurgery Dr Adriana Simas. S/p C3/4 ACDF years ago. Most recently referred to physical therapy x1 without benefit, now planned rpt ACDF at C5/6. ?ulnar neuropathy.   Gout - started allopurinol 100mg  daily - continues this. Colchicine every other day PRN due to interaction with carvedilol.   DM - does not regularly check sugars. Compliant with antihyperglycemic regimen which includes: farxiga 10mg  daily, ozempic 1mg  weekly. Denies low sugars or hypoglycemic symptoms. Occ foot paresthesias but no blurry vision. Last diabetic eye exam DUE. Glucometer brand: accuchek. Last foot exam: Due. DSME: done remotely at [facility] Lab Results  Component Value Date   HGBA1C 6.3 (A) 02/13/2023   Diabetic Foot Exam - Simple   Simple Foot Form Diabetic Foot exam was performed with the following findings: Yes 02/13/2023  9:54 AM  Visual Inspection No deformities, no ulcerations, no other skin breakdown bilaterally: Yes Sensation Testing Intact to touch and monofilament testing bilaterally: Yes Pulse Check Posterior Tibialis and Dorsalis pulse  intact bilaterally: Yes Comments No claudication Dry skin to soles    Lab Results  Component Value Date   MICROALBUR <0.7 07/25/2022         Relevant past medical, surgical, family and social history reviewed and updated as indicated. Interim medical history since our last visit reviewed. Allergies and medications reviewed and updated. Outpatient Medications Prior to Visit  Medication Sig Dispense Refill  . allopurinol (ZYLOPRIM) 100 MG tablet Take 1 tablet (100 mg total) by mouth daily. Increase as directed 90 tablet 3  . aspirin 81 MG chewable tablet Chew 81 mg by mouth daily.    Marland Kitchen atorvastatin (LIPITOR) 20 MG tablet Take 1 tablet (20 mg total) by mouth daily. 90 tablet 4  . benzonatate (TESSALON) 200 MG capsule Take 1 capsule (200 mg total) by mouth 3 (three) times daily as needed for cough. 60 capsule 0  . bumetanide (BUMEX) 1 MG tablet Take 3 tablets (3 mg total) by mouth daily.    . carvedilol (COREG) 3.125 MG tablet TAKE ONE TABLET (3.125 MG TOTAL) BY MOUTH TWO (TWO) TIMES DAILY WITH A MEAL. (STOP METOPROLOL) 60 tablet 5  . citalopram (CELEXA) 20 MG tablet Take 1 tablet (20 mg total) by mouth daily. 90 tablet 4  . colchicine 0.6 MG tablet TAKE ONE TABLET (0.6 MG TOTAL) BY MOUTH DAILY AS NEEDED (GOUT FLARE). MAY TAKE TWO TABLETS ON FIRST DAY OF GOUT FLARE 60 tablet 0  . dapagliflozin propanediol (FARXIGA) 10 MG  TABS tablet Take 10 mg by mouth daily before breakfast.    . dexlansoprazole (DEXILANT) 60 MG capsule Take 1 capsule (60 mg total) by mouth daily. 90 capsule 4  . famotidine (PEPCID) 20 MG tablet Take 1 tablet (20 mg total) by mouth at bedtime. 90 tablet 4  . ferrous sulfate 325 (65 FE) MG EC tablet Take 1 tablet (325 mg total) by mouth every other day.    . fluticasone (FLONASE) 50 MCG/ACT nasal spray Place 2 sprays into both nostrils daily. 16 g 6  . isosorbide mononitrate (IMDUR) 30 MG 24 hr tablet Take 1 tablet (30 mg total) by mouth daily.    Marland Kitchen levothyroxine  (SYNTHROID) 125 MCG tablet TAKE ONE TAB BY MOUTH ONCE DAILY. TAKE ON AN EMPTY STOMACH WITH A GLASS OF WATER ATLEAST 30-60 MINUTES BEFORE BREAKFAST 90 tablet 4  . Magnesium 400 MG TABS Take 400 mg by mouth 2 (two) times daily.    . metoCLOPramide (REGLAN) 5 MG tablet Take 1 tablet (5 mg total) by mouth every 8 (eight) hours as needed for nausea. TAKE ONE TABLET BY MOUTH THREE TIMES DAILY BEFORE MEALS (NEED APPT FOR FUTURE REFILLS) 90 tablet 1  . metolazone (ZAROXOLYN) 2.5 MG tablet Take 1 tablet (2.5 mg total) by mouth once a week. As needed for fluid/swelling    . Multiple Vitamin (MULITIVITAMIN WITH MINERALS) TABS Take 1 tablet by mouth daily.    . nitroGLYCERIN (NITROSTAT) 0.4 MG SL tablet Place 1 tablet (0.4 mg total) under the tongue every 5 (five) minutes as needed for chest pain. 60 tablet 3  . potassium chloride (KLOR-CON) 10 MEQ tablet TAKE ONE TABLET BY MOUTH TWICE A DAY 180 tablet 3  . predniSONE (DELTASONE) 20 MG tablet Take two tablets daily for 4 days followed by one tablet daily for 4 days 12 tablet 0  . spironolactone (ALDACTONE) 25 MG tablet Take 25 mg by mouth daily.    . VENTOLIN HFA 108 (90 Base) MCG/ACT inhaler INHALE 2 PUFFS INTO THE LUNGS EVERY 6 HOURS AS NEEDED FOR WHEEZING OR SHORTNESS OF BREATH 18 g 5  . vitamin B-12 (CYANOCOBALAMIN) 500 MCG tablet Take 1 tablet (500 mcg total) by mouth daily.    . vitamin E 400 UNIT capsule Take 400 Units by mouth daily.    . Semaglutide, 1 MG/DOSE, 4 MG/3ML SOPN Inject 1 mg as directed once a week. 9 mL 2  . amoxicillin-clavulanate (AUGMENTIN) 875-125 MG tablet Take 1 tablet by mouth 2 (two) times daily. 14 tablet 0   No facility-administered medications prior to visit.     Per HPI unless specifically indicated in ROS section below Review of Systems  Objective:  BP 128/70   Pulse 70   Temp 97.7 F (36.5 C) (Oral)   Ht 5\' 9"  (1.753 m)   Wt 258 lb 2 oz (117.1 kg)   SpO2 97%   BMI 38.12 kg/m   Wt Readings from Last 3 Encounters:   02/13/23 258 lb 2 oz (117.1 kg)  01/31/23 262 lb (118.8 kg)  08/12/22 249 lb (112.9 kg)      Physical Exam Vitals and nursing note reviewed.  Constitutional:      Appearance: Normal appearance. He is not ill-appearing.  Eyes:     Extraocular Movements: Extraocular movements intact.     Conjunctiva/sclera: Conjunctivae normal.     Pupils: Pupils are equal, round, and reactive to light.  Cardiovascular:     Rate and Rhythm: Normal rate and regular rhythm.  Pulses: Normal pulses.     Heart sounds: Normal heart sounds. No murmur heard. Pulmonary:     Effort: Pulmonary effort is normal. No respiratory distress.     Breath sounds: Normal breath sounds. No wheezing, rhonchi or rales.  Musculoskeletal:     Right lower leg: No edema.     Left lower leg: No edema.     Comments: See HPI for foot exam if done  Skin:    General: Skin is warm and dry.     Findings: No rash.  Neurological:     Mental Status: He is alert.  Psychiatric:        Mood and Affect: Mood normal.        Behavior: Behavior normal.      Results for orders placed or performed in visit on 02/13/23  POCT glycosylated hemoglobin (Hb A1C)   Collection Time: 02/13/23  9:46 AM  Result Value Ref Range   Hemoglobin A1C 6.3 (A) 4.0 - 5.6 %   HbA1c POC (<> result, manual entry)     HbA1c, POC (prediabetic range)     HbA1c, POC (controlled diabetic range)     *Note: Due to a large number of results and/or encounters for the requested time period, some results have not been displayed. A complete set of results can be found in Results Review.    Assessment & Plan:   Problem List Items Addressed This Visit     Diabetes mellitus type 2 with retinopathy (HCC)   Advised he schedule diabetic eye exam as due.       Relevant Medications   Semaglutide, 2 MG/DOSE, 8 MG/3ML SOPN   Severe obesity (BMI 35.0-39.9) with comorbidity (HCC)   Increase Ozempic to 2mg  weekly      Relevant Medications   Semaglutide, 2 MG/DOSE,  8 MG/3ML SOPN   Coronary artery disease, non-occlusive   Appreciate [facility] cardiology care.  Now on Imdur 30mg  daily. He notes ongoing chest discomfort - advised to notify cardiology team. He is also now on carvedilol 3.125mg  bid non-selective BB for esophageal variceal prevention per GI recommendations - to let cardiology team know.       Chronic diastolic CHF (congestive heart failure) (HCC)   Followed by [facility] cardiology and CHF clinic, has Cardio[facility]s in place.       Liver cirrhosis secondary to NASH [facility])   Appreciate GI care. See above re: BB choice.       DDD (degenerative disc disease), cervical   Known cervical spinal stenosis with radiculopathy s/p C3/4 ACDF years ago.  Now seeing [facility] neurosurgery with plan for repeat ACDF at C5/6 early 2025.       Gout   Continues allopurinol 100mg  daily with benefit. He's been taking colchicine daily - advised change to every other day PRN dosing (lower dose given carvedilol interaction).       Malaise and fatigue   Intermittent fatigue attributed to cardiac condition.       Type 2 diabetes mellitus with other specified complication (HCC) - Primary   Chronic, stable, tolerating farxiga and Ozempic 1mg  weekly - will increase dose to 2mg  weekly, reviewed monitoring for GI side effects.       Relevant Medications   Semaglutide, 2 MG/DOSE, 8 MG/3ML SOPN   Other Relevant Orders   POCT glycosylated hemoglobin (Hb A1C) (Completed)     Meds ordered this encounter  Medications  . Semaglutide, 2 MG/DOSE, 8 MG/3ML SOPN    Sig: Inject 2 mg as directed  once a week.    Dispense:  9 mL    Refill:  1    Orders Placed This Encounter  Procedures  . POCT glycosylated hemoglobin (Hb A1C)    Patient Instructions  You should be taking carvedilol 3.125mg  twice daily, instead of metoprolol.  Call to schedule diabetic eye exam at [facility] eye.  Try continuous glucose monitor - sample provided today  For gout - continue allopurinol 100mg  daily  for gout prevention. Use Colchicine every other day only as needed when you get a gout flare.  Good to see you today. Continue current medicines.  Return in 3 months for follow up visit   Follow up plan: Return in about 3 months (around 05/14/2023) for follow up visit.  Chad Boyden, MD

## 2023-02-14 ENCOUNTER — Ambulatory Visit (HOSPITAL_COMMUNITY)
Admission: RE | Admit: 2023-02-14 | Discharge: 2023-02-14 | Disposition: A | Discharge status: Home / Self Care | Payer: Medicare Other | Source: Ambulatory Visit | Attending: Internal Medicine | Admitting: Internal Medicine

## 2023-02-14 DIAGNOSIS — K7460 Unspecified cirrhosis of liver: Secondary | ICD-10-CM | POA: Diagnosis not present

## 2023-02-14 DIAGNOSIS — Z9049 Acquired absence of other specified parts of digestive tract: Secondary | ICD-10-CM | POA: Diagnosis not present

## 2023-02-14 DIAGNOSIS — K7581 Nonalcoholic steatohepatitis (NASH): Secondary | ICD-10-CM | POA: Diagnosis not present

## 2023-02-14 DIAGNOSIS — R161 Splenomegaly, not elsewhere classified: Secondary | ICD-10-CM | POA: Diagnosis not present

## 2023-02-14 MED ORDER — GADOBUTROL 1 MMOL/ML IV SOLN
10.0000 mL | Freq: Once | INTRAVENOUS | Status: AC | PRN
  Administered 2023-02-14: 10 mL via INTRAVENOUS

## 2023-02-16 DIAGNOSIS — E1169 Type 2 diabetes mellitus with other specified complication: Secondary | ICD-10-CM | POA: Insufficient documentation

## 2023-02-16 NOTE — Assessment & Plan Note (Signed)
Advised he schedule diabetic eye exam as due.

## 2023-02-16 NOTE — Assessment & Plan Note (Signed)
Appreciate [facility] cardiology care.  Now on Imdur 30mg  daily. He notes ongoing chest discomfort - advised to notify cardiology team. He is also now on carvedilol 3.125mg  bid non-selective BB for esophageal variceal prevention per GI recommendations - to let cardiology team know.

## 2023-02-16 NOTE — Assessment & Plan Note (Signed)
Appreciate GI care. See above re: BB choice.

## 2023-02-16 NOTE — Assessment & Plan Note (Deleted)
Chronic, stable, tolerating Ozempic 1mg  weekly - will increase dose to 2mg  weekly, reviewed monitoring for GI side effects.  Advised he schedule diabetic eye exam as due.

## 2023-02-16 NOTE — Assessment & Plan Note (Signed)
Continues allopurinol 100mg  daily with benefit. He's been taking colchicine daily - advised change to every other day PRN dosing (lower dose given carvedilol interaction).

## 2023-02-16 NOTE — Assessment & Plan Note (Signed)
Increase Ozempic to 2 mg weekly. 

## 2023-02-16 NOTE — Assessment & Plan Note (Signed)
Followed by [facility] cardiology and CHF clinic, has Cardio[facility] in place.

## 2023-02-16 NOTE — Assessment & Plan Note (Signed)
Intermittent fatigue attributed to cardiac condition.

## 2023-02-16 NOTE — Assessment & Plan Note (Signed)
Known cervical spinal stenosis with radiculopathy s/p C3/4 ACDF years ago.  Now seeing [facility] neurosurgery with plan for repeat ACDF at C5/6 early 2025.

## 2023-02-16 NOTE — Assessment & Plan Note (Addendum)
Chronic, stable, tolerating farxiga and Ozempic 1mg  weekly - will increase dose to 2mg  weekly, reviewed monitoring for GI side effects.

## 2023-02-27 ENCOUNTER — Other Ambulatory Visit: Payer: Self-pay | Admitting: Family Medicine

## 2023-02-27 DIAGNOSIS — E039 Hypothyroidism, unspecified: Secondary | ICD-10-CM

## 2023-03-01 DIAGNOSIS — I10 Essential (primary) hypertension: Secondary | ICD-10-CM | POA: Diagnosis not present

## 2023-03-01 DIAGNOSIS — K7581 Nonalcoholic steatohepatitis (NASH): Secondary | ICD-10-CM | POA: Diagnosis not present

## 2023-03-01 DIAGNOSIS — M79602 Pain in left arm: Secondary | ICD-10-CM | POA: Diagnosis not present

## 2023-03-01 DIAGNOSIS — E039 Hypothyroidism, unspecified: Secondary | ICD-10-CM | POA: Diagnosis not present

## 2023-03-01 DIAGNOSIS — Z01818 Encounter for other preprocedural examination: Secondary | ICD-10-CM | POA: Diagnosis not present

## 2023-03-01 DIAGNOSIS — I5032 Chronic diastolic (congestive) heart failure: Secondary | ICD-10-CM | POA: Diagnosis not present

## 2023-03-01 DIAGNOSIS — M5412 Radiculopathy, cervical region: Secondary | ICD-10-CM | POA: Diagnosis not present

## 2023-03-01 DIAGNOSIS — I2510 Atherosclerotic heart disease of native coronary artery without angina pectoris: Secondary | ICD-10-CM | POA: Diagnosis not present

## 2023-03-01 DIAGNOSIS — M79601 Pain in right arm: Secondary | ICD-10-CM | POA: Diagnosis not present

## 2023-03-01 DIAGNOSIS — K7460 Unspecified cirrhosis of liver: Secondary | ICD-10-CM | POA: Diagnosis not present

## 2023-03-01 DIAGNOSIS — Z9884 Bariatric surgery status: Secondary | ICD-10-CM | POA: Diagnosis not present

## 2023-03-01 DIAGNOSIS — G4733 Obstructive sleep apnea (adult) (pediatric): Secondary | ICD-10-CM | POA: Diagnosis not present

## 2023-03-06 ENCOUNTER — Encounter: Payer: Self-pay | Admitting: Family Medicine

## 2023-03-06 ENCOUNTER — Ambulatory Visit (INDEPENDENT_AMBULATORY_CARE_PROVIDER_SITE_OTHER): Payer: Medicare Other | Admitting: Family Medicine

## 2023-03-06 VITALS — BP 122/64 | HR 75 | Temp 98.5°F | Ht 69.0 in | Wt 258.0 lb

## 2023-03-06 DIAGNOSIS — J019 Acute sinusitis, unspecified: Secondary | ICD-10-CM | POA: Diagnosis not present

## 2023-03-06 MED ORDER — FLUTICASONE PROPIONATE 50 MCG/ACT NA SUSP: 2.0000 | Freq: Every day | NASAL | 6 refills | Status: DC

## 2023-03-06 MED ORDER — AMOXICILLIN-POT CLAVULANATE 875-125 MG PO TABS: 1.0000 | ORAL_TABLET | Freq: Two times a day (BID) | ORAL | 0 refills | Status: AC

## 2023-03-06 NOTE — Assessment & Plan Note (Signed)
 Ongoing symptoms for 4 days. Discussed suspected viral sinusitis. Rec start flonase use. WASP for augmentin 10d course with indications when to fill, especially in setting of upcoming neck surgery. Pt agrees with plan.

## 2023-03-06 NOTE — Progress Notes (Signed)
 Ph: [phone] Fax: [phone]   Patient ID: Chad [name], male    DOB: [birth year], 68 y.o.   MRN: [id number]  This visit was conducted in person.  BP 122/64   Pulse 75   Temp 98.5 F (36.9 C) (Oral)   Ht 5' 9 (1.753 m)   Wt 258 lb (117 kg)   SpO2 95%   BMI 38.10 kg/m    CC: sinusitis  Subjective:   HPI: PATE Chad [name] is a 68 y.o. male presenting on 03/06/2023 for Sinus Problem (C/o nasal congestion and sinus pressure. Sxs started 03/03/23. Concerned due to h/o sinus infections. Pt accompanied by wife, Chad [name]. )   4d h/o head congestion, fatigue, sinus pressure stopped up. Yesterday started coughing brown sputum, ears stopped up. Left upper toothache for 4 days as well. Stayed in bed all weekend.  No fevers/chills.   Took nyquil with benefit.  He is out of flonase .   Last sinusitis treated 01/31/2023 with augmentin  7d course.  Upcoming neck surgery 03/16/2023.   Known diabetic managed on farxiga  10mg  daily, Ozempic  2mg  weekly.  Lab Results  Component Value Date   HGBA1C 6.3 (A) 02/13/2023        Relevant past medical, surgical, family and social history reviewed and updated as indicated. Interim medical history since our last visit reviewed. Allergies and medications reviewed and updated. Outpatient Medications Prior to Visit  Medication Sig Dispense Refill   allopurinol  (ZYLOPRIM ) 100 MG tablet Take 1 tablet (100 mg total) by mouth daily. Increase as directed 90 tablet 3   aspirin  81 MG chewable tablet Chew 81 mg by mouth daily.     atorvastatin  (LIPITOR) 20 MG tablet Take 1 tablet (20 mg total) by mouth daily. 90 tablet 4   benzonatate  (TESSALON ) 200 MG capsule Take 1 capsule (200 mg total) by mouth 3 (three) times daily as needed for cough. 60 capsule 0   bumetanide  (BUMEX ) 1 MG tablet Take 3 tablets (3 mg total) by mouth daily.     carvedilol  (COREG ) 3.125 MG tablet TAKE ONE TABLET (3.125 MG TOTAL) BY MOUTH TWO (TWO) TIMES DAILY WITH A MEAL. (STOP  METOPROLOL ) 60 tablet 5   citalopram  (CELEXA ) 20 MG tablet Take 1 tablet (20 mg total) by mouth daily. 90 tablet 4   colchicine  0.6 MG tablet TAKE ONE TABLET (0.6 MG TOTAL) BY MOUTH DAILY AS NEEDED (GOUT FLARE). MAY TAKE TWO TABLETS ON FIRST DAY OF GOUT FLARE 60 tablet 0   dapagliflozin  propanediol (FARXIGA ) 10 MG TABS tablet Take 10 mg by mouth daily before breakfast.     dexlansoprazole  (DEXILANT ) 60 MG capsule Take 1 capsule (60 mg total) by mouth daily. 90 capsule 4   famotidine  (PEPCID ) 20 MG tablet Take 1 tablet (20 mg total) by mouth at bedtime. 90 tablet 4   ferrous sulfate  325 (65 FE) MG EC tablet Take 1 tablet (325 mg total) by mouth every other day.     isosorbide  mononitrate (IMDUR ) 30 MG 24 hr tablet Take by mouth. Takes 2 times a day.     levothyroxine  (SYNTHROID ) 125 MCG tablet TAKE ONE TAB BY MOUTH ONCE DAILY. TAKE ON AN EMPTY STOMACH WITH A GLASS OF WATER ATLEAST 30-60 MINUTES BEFORE BREAKFAST 90 tablet 4   Magnesium  400 MG TABS Take 400 mg by mouth 2 (two) times daily.     metoCLOPramide  (REGLAN ) 5 MG tablet Take 1 tablet (5 mg total) by mouth every 8 (eight) hours as needed for nausea. TAKE ONE  TABLET BY MOUTH THREE TIMES DAILY BEFORE MEALS (NEED APPT FOR FUTURE REFILLS) 90 tablet 1   metolazone  (ZAROXOLYN ) 2.5 MG tablet Take 1 tablet (2.5 mg total) by mouth once a week. As needed for fluid/swelling     Multiple Vitamin (MULITIVITAMIN WITH MINERALS) TABS Take 1 tablet by mouth daily.     nitroGLYCERIN  (NITROSTAT ) 0.4 MG SL tablet Place 1 tablet (0.4 mg total) under the tongue every 5 (five) minutes as needed for chest pain. 60 tablet 3   potassium chloride  (KLOR-CON ) 10 MEQ tablet TAKE ONE TABLET BY MOUTH TWICE A DAY 180 tablet 3   Semaglutide , 2 MG/DOSE, 8 MG/3ML SOPN Inject 2 mg as directed once a week. 9 mL 1   spironolactone  (ALDACTONE ) 25 MG tablet Take 25 mg by mouth daily.     VENTOLIN  HFA 108 (90 Base) MCG/ACT inhaler INHALE 2 PUFFS INTO THE LUNGS EVERY 6 HOURS AS NEEDED  FOR WHEEZING OR SHORTNESS OF BREATH 18 g 5   vitamin B-12 (CYANOCOBALAMIN ) 500 MCG tablet Take 1 tablet (500 mcg total) by mouth daily.     vitamin E  400 UNIT capsule Take 400 Units by mouth daily.     fluticasone  (FLONASE ) 50 MCG/ACT nasal spray Place 2 sprays into both nostrils daily. 16 g 6   isosorbide  mononitrate (IMDUR ) 30 MG 24 hr tablet Take 1 tablet (30 mg total) by mouth daily.     predniSONE  (DELTASONE ) 20 MG tablet Take two tablets daily for 4 days followed by one tablet daily for 4 days 12 tablet 0   No facility-administered medications prior to visit.     Per HPI unless specifically indicated in ROS section below Review of Systems  Objective:  BP 122/64   Pulse 75   Temp 98.5 F (36.9 C) (Oral)   Ht 5' 9 (1.753 m)   Wt 258 lb (117 kg)   SpO2 95%   BMI 38.10 kg/m   Wt Readings from Last 3 Encounters:  03/06/23 258 lb (117 kg)  02/13/23 258 lb 2 oz (117.1 kg)  01/31/23 262 lb (118.8 kg)      Physical Exam Vitals and nursing note reviewed.  Constitutional:      Appearance: Normal appearance. He is not ill-appearing.  HENT:     Head: Normocephalic and atraumatic.     Right Ear: Hearing, tympanic membrane, ear canal and external ear normal. There is no impacted cerumen.     Left Ear: Hearing, tympanic membrane, ear canal and external ear normal. There is no impacted cerumen.     Nose: Congestion and rhinorrhea present.     Right Sinus: Maxillary sinus tenderness present. No frontal sinus tenderness.     Left Sinus: Maxillary sinus tenderness present. No frontal sinus tenderness.     Comments: Wearing mask    Mouth/Throat:     Mouth: Mucous membranes are moist.     Pharynx: Oropharynx is clear. No oropharyngeal exudate or posterior oropharyngeal erythema.     Comments: S/p UPPP Eyes:     Extraocular Movements: Extraocular movements intact.     Conjunctiva/sclera: Conjunctivae normal.     Pupils: Pupils are equal, round, and reactive to light.  Cardiovascular:      Rate and Rhythm: Normal rate and regular rhythm.     Pulses: Normal pulses.     Heart sounds: Normal heart sounds. No murmur heard. Pulmonary:     Effort: Pulmonary effort is normal. No respiratory distress.     Breath sounds: Normal breath sounds. No  wheezing, rhonchi or rales.  Musculoskeletal:     Cervical back: Normal range of motion and neck supple. No rigidity.     Right lower leg: No edema.     Left lower leg: No edema.  Lymphadenopathy:     Cervical: No cervical adenopathy.  Skin:    General: Skin is warm and dry.     Findings: No rash.  Neurological:     Mental Status: He is alert.  Psychiatric:        Mood and Affect: Mood normal.        Behavior: Behavior normal.       Results for orders placed or performed in visit on 02/13/23  POCT glycosylated hemoglobin (Hb A1C)   Collection Time: 02/13/23  9:46 AM  Result Value Ref Range   Hemoglobin A1C 6.3 (A) 4.0 - 5.6 %   HbA1c POC (<> result, manual entry)     HbA1c, POC (prediabetic range)     HbA1c, POC (controlled diabetic range)     *Note: Due to a large number of results and/or encounters for the requested time period, some results have not been displayed. A complete set of results can be found in Results Review.    Assessment & Plan:   Problem List Items Addressed This Visit     Acute sinusitis - Primary   Ongoing symptoms for 4 days. Discussed suspected viral sinusitis. Rec start flonase  use. WASP for augmentin  10d course with indications when to fill, especially in setting of upcoming neck surgery. Pt agrees with plan.       Relevant Medications   fluticasone  (FLONASE ) 50 MCG/ACT nasal spray   amoxicillin -clavulanate (AUGMENTIN ) 875-125 MG tablet     Meds ordered this encounter  Medications   fluticasone  (FLONASE ) 50 MCG/ACT nasal spray    Sig: Place 2 sprays into both nostrils daily.    Dispense:  16 g    Refill:  6   amoxicillin -clavulanate (AUGMENTIN ) 875-125 MG tablet    Sig: Take 1 tablet  by mouth 2 (two) times daily for 10 days.    Dispense:  20 tablet    Refill:  0    No orders of the defined types were placed in this encounter.   Patient Instructions  You have a sinus infection, possibly viral as early on.  Take medicine as prescribed: flonase .  I've also sent augmentin  antibiotic to start taking if not improving as expected.  Push fluids and plenty of rest. Nasal saline irrigation or neti pot to help drain sinuses. May use plain mucinex  (guaifenesin ) with plenty of fluid to help mobilize mucous. Please let us  know if fever >101.5, trouble opening/closing mouth, difficulty swallowing, or worsening instead of improving as expected.   Follow up plan: Return if symptoms worsen or fail to improve.  Anton Blas, MD

## 2023-03-06 NOTE — Patient Instructions (Signed)
 You have a sinus infection, possibly viral as early on.  Take medicine as prescribed: flonase .  I've also sent augmentin  antibiotic to start taking if not improving as expected.  Push fluids and plenty of rest. Nasal saline irrigation or neti pot to help drain sinuses. May use plain mucinex  (guaifenesin ) with plenty of fluid to help mobilize mucous. Please let us  know if fever >101.5, trouble opening/closing mouth, difficulty swallowing, or worsening instead of improving as expected.

## 2023-03-10 DIAGNOSIS — I25118 Atherosclerotic heart disease of native coronary artery with other forms of angina pectoris: Secondary | ICD-10-CM | POA: Diagnosis not present

## 2023-03-10 DIAGNOSIS — I2510 Atherosclerotic heart disease of native coronary artery without angina pectoris: Secondary | ICD-10-CM | POA: Diagnosis not present

## 2023-03-10 DIAGNOSIS — E782 Mixed hyperlipidemia: Secondary | ICD-10-CM | POA: Diagnosis not present

## 2023-03-10 DIAGNOSIS — I5032 Chronic diastolic (congestive) heart failure: Secondary | ICD-10-CM | POA: Diagnosis not present

## 2023-03-10 DIAGNOSIS — I10 Essential (primary) hypertension: Secondary | ICD-10-CM | POA: Diagnosis not present

## 2023-03-16 DIAGNOSIS — I10 Essential (primary) hypertension: Secondary | ICD-10-CM | POA: Diagnosis not present

## 2023-03-16 DIAGNOSIS — I5032 Chronic diastolic (congestive) heart failure: Secondary | ICD-10-CM | POA: Diagnosis not present

## 2023-03-16 DIAGNOSIS — E1143 Type 2 diabetes mellitus with diabetic autonomic (poly)neuropathy: Secondary | ICD-10-CM | POA: Diagnosis not present

## 2023-03-16 DIAGNOSIS — E1169 Type 2 diabetes mellitus with other specified complication: Secondary | ICD-10-CM | POA: Diagnosis not present

## 2023-03-16 DIAGNOSIS — K7460 Unspecified cirrhosis of liver: Secondary | ICD-10-CM | POA: Diagnosis not present

## 2023-03-16 DIAGNOSIS — G4733 Obstructive sleep apnea (adult) (pediatric): Secondary | ICD-10-CM | POA: Diagnosis not present

## 2023-03-16 DIAGNOSIS — K219 Gastro-esophageal reflux disease without esophagitis: Secondary | ICD-10-CM | POA: Diagnosis not present

## 2023-03-16 DIAGNOSIS — N189 Chronic kidney disease, unspecified: Secondary | ICD-10-CM | POA: Diagnosis not present

## 2023-03-16 DIAGNOSIS — K3184 Gastroparesis: Secondary | ICD-10-CM | POA: Diagnosis not present

## 2023-03-16 DIAGNOSIS — E039 Hypothyroidism, unspecified: Secondary | ICD-10-CM | POA: Diagnosis not present

## 2023-03-16 DIAGNOSIS — G959 Disease of spinal cord, unspecified: Secondary | ICD-10-CM | POA: Diagnosis not present

## 2023-03-16 DIAGNOSIS — I493 Ventricular premature depolarization: Secondary | ICD-10-CM | POA: Diagnosis not present

## 2023-03-16 DIAGNOSIS — E1122 Type 2 diabetes mellitus with diabetic chronic kidney disease: Secondary | ICD-10-CM | POA: Diagnosis not present

## 2023-03-16 DIAGNOSIS — I2510 Atherosclerotic heart disease of native coronary artery without angina pectoris: Secondary | ICD-10-CM | POA: Diagnosis not present

## 2023-03-16 DIAGNOSIS — E785 Hyperlipidemia, unspecified: Secondary | ICD-10-CM | POA: Diagnosis not present

## 2023-03-16 DIAGNOSIS — Z981 Arthrodesis status: Secondary | ICD-10-CM | POA: Diagnosis not present

## 2023-03-16 DIAGNOSIS — Z9884 Bariatric surgery status: Secondary | ICD-10-CM | POA: Diagnosis not present

## 2023-03-16 DIAGNOSIS — I130 Hypertensive heart and chronic kidney disease with heart failure and stage 1 through stage 4 chronic kidney disease, or unspecified chronic kidney disease: Secondary | ICD-10-CM | POA: Diagnosis not present

## 2023-03-16 DIAGNOSIS — R001 Bradycardia, unspecified: Secondary | ICD-10-CM | POA: Diagnosis not present

## 2023-03-16 DIAGNOSIS — I440 Atrioventricular block, first degree: Secondary | ICD-10-CM | POA: Diagnosis not present

## 2023-03-16 DIAGNOSIS — M5412 Radiculopathy, cervical region: Secondary | ICD-10-CM | POA: Diagnosis not present

## 2023-03-16 DIAGNOSIS — I4510 Unspecified right bundle-branch block: Secondary | ICD-10-CM | POA: Diagnosis not present

## 2023-03-16 DIAGNOSIS — G992 Myelopathy in diseases classified elsewhere: Secondary | ICD-10-CM | POA: Diagnosis not present

## 2023-03-16 DIAGNOSIS — I252 Old myocardial infarction: Secondary | ICD-10-CM | POA: Diagnosis not present

## 2023-03-16 DIAGNOSIS — M5000 Cervical disc disorder with myelopathy, unspecified cervical region: Secondary | ICD-10-CM | POA: Diagnosis not present

## 2023-03-16 DIAGNOSIS — D689 Coagulation defect, unspecified: Secondary | ICD-10-CM | POA: Diagnosis not present

## 2023-03-16 DIAGNOSIS — I6529 Occlusion and stenosis of unspecified carotid artery: Secondary | ICD-10-CM | POA: Diagnosis not present

## 2023-03-16 DIAGNOSIS — K7581 Nonalcoholic steatohepatitis (NASH): Secondary | ICD-10-CM | POA: Diagnosis not present

## 2023-03-16 DIAGNOSIS — D696 Thrombocytopenia, unspecified: Secondary | ICD-10-CM | POA: Diagnosis not present

## 2023-03-16 DIAGNOSIS — M4802 Spinal stenosis, cervical region: Secondary | ICD-10-CM | POA: Diagnosis not present

## 2023-03-17 DIAGNOSIS — I10 Essential (primary) hypertension: Secondary | ICD-10-CM | POA: Diagnosis not present

## 2023-03-17 DIAGNOSIS — I130 Hypertensive heart and chronic kidney disease with heart failure and stage 1 through stage 4 chronic kidney disease, or unspecified chronic kidney disease: Secondary | ICD-10-CM | POA: Diagnosis not present

## 2023-03-17 DIAGNOSIS — K7581 Nonalcoholic steatohepatitis (NASH): Secondary | ICD-10-CM | POA: Diagnosis not present

## 2023-03-17 DIAGNOSIS — I440 Atrioventricular block, first degree: Secondary | ICD-10-CM | POA: Diagnosis not present

## 2023-03-17 DIAGNOSIS — I5032 Chronic diastolic (congestive) heart failure: Secondary | ICD-10-CM | POA: Diagnosis not present

## 2023-03-17 DIAGNOSIS — I6529 Occlusion and stenosis of unspecified carotid artery: Secondary | ICD-10-CM | POA: Diagnosis not present

## 2023-03-17 DIAGNOSIS — M5412 Radiculopathy, cervical region: Secondary | ICD-10-CM | POA: Diagnosis not present

## 2023-03-17 DIAGNOSIS — I2510 Atherosclerotic heart disease of native coronary artery without angina pectoris: Secondary | ICD-10-CM | POA: Diagnosis not present

## 2023-03-17 DIAGNOSIS — E1169 Type 2 diabetes mellitus with other specified complication: Secondary | ICD-10-CM | POA: Diagnosis not present

## 2023-03-17 DIAGNOSIS — E039 Hypothyroidism, unspecified: Secondary | ICD-10-CM | POA: Diagnosis not present

## 2023-03-17 DIAGNOSIS — K7460 Unspecified cirrhosis of liver: Secondary | ICD-10-CM | POA: Diagnosis not present

## 2023-03-17 DIAGNOSIS — Z9884 Bariatric surgery status: Secondary | ICD-10-CM | POA: Diagnosis not present

## 2023-03-17 DIAGNOSIS — G959 Disease of spinal cord, unspecified: Secondary | ICD-10-CM | POA: Diagnosis not present

## 2023-03-18 ENCOUNTER — Other Ambulatory Visit: Payer: Self-pay | Admitting: Nurse Practitioner

## 2023-03-29 DIAGNOSIS — M25512 Pain in left shoulder: Secondary | ICD-10-CM | POA: Diagnosis not present

## 2023-03-29 DIAGNOSIS — Z48811 Encounter for surgical aftercare following surgery on the nervous system: Secondary | ICD-10-CM | POA: Diagnosis not present

## 2023-03-29 DIAGNOSIS — Z043 Encounter for examination and observation following other accident: Secondary | ICD-10-CM | POA: Diagnosis not present

## 2023-03-29 DIAGNOSIS — Z981 Arthrodesis status: Secondary | ICD-10-CM | POA: Diagnosis not present

## 2023-03-29 DIAGNOSIS — Z7982 Long term (current) use of aspirin: Secondary | ICD-10-CM | POA: Diagnosis not present

## 2023-05-03 ENCOUNTER — Ambulatory Visit: Payer: Self-pay | Admitting: Family Medicine

## 2023-05-03 ENCOUNTER — Other Ambulatory Visit: Payer: Self-pay | Admitting: Family Medicine

## 2023-05-03 DIAGNOSIS — M109 Gout, unspecified: Secondary | ICD-10-CM

## 2023-05-03 NOTE — Telephone Encounter (Signed)
 Colchicine Last filled:  09/07/22, #60 Last OV:  03/06/23, acute sinusitis Next OV:  05/15/23, 3 mo f/u

## 2023-05-03 NOTE — Telephone Encounter (Signed)
  Chief Complaint: sinus pressure  Symptoms: congestion; sore throat, cough   Disposition: [] ED /[] Urgent Care (no appt availability in office) / [x] Appointment(In office/virtual)/ []  [facility]/ [] Home Care/ [] Refused Recommended Disposition /[] [facility]/ []  Follow-up with PCP Additional Notes: pt wife Natalia Leatherwood calling on behalf of pt. Pt has sinus congestion, cough, sore throat that presented Sunday.  Nagging cough can cause SOB, but denies SOB otherwise.Pt has had low grade fever on and off. Mucus is yellow in color. Pt has appt tomorrow  At 0920. RN gave care advice and Natalia Leatherwood verbalized understanding.            Copied from CRM [id number]. Topic: Clinical - Red Word Triage >> May 03, 2023  8:41 AM Theodis Sato wrote: Red Word that prompted transfer to Nurse Triage: Yellow mucus with a cough and sinus pain/pressure Reason for Disposition  [1] Sinus pain (not just congestion) AND [2] fever  Answer Assessment - Initial Assessment Questions 1. LOCATION: "Where does it hurt?"      Over eyes, forehead  2. ONSET: "When did the sinus pain start?"  (e.g., hours, days)      Sunday  3. SEVERITY: "How bad is the pain?"   (Scale 1-10; mild, moderate or severe)   - MILD (1-3): doesn't interfere with normal activities    - MODERATE (4-7): interferes with normal activities (e.g., work or school) or awakens from sleep   - SEVERE (8-10): excruciating pain and patient unable to do any normal activities        Moderate  4. RECURRENT SYMPTOM: "Have you ever had sinus problems before?" If Yes, ask: "When was the last time?" and "What happened that time?"      Yes last month  5. NASAL CONGESTION: "Is the nose blocked?" If Yes, ask: "Can you open it or must you breathe through your mouth?"     Yes  6. NASAL DISCHARGE: "Do you have discharge from your nose?" If so ask, "What color?"     Yellow  7. FEVER: "Do you have a fever?" If Yes, ask: "What is it, how was it  measured, and when did it start?"      Low grade  8. OTHER SYMPTOMS: "Do you have any other symptoms?" (e.g., sore throat, cough, earache, difficulty breathing)     Cough , sore throat,  Protocols used: Sinus Pain or Congestion-A-AH

## 2023-05-03 NOTE — Telephone Encounter (Signed)
 Noted! Thank you

## 2023-05-04 ENCOUNTER — Ambulatory Visit (INDEPENDENT_AMBULATORY_CARE_PROVIDER_SITE_OTHER): Admitting: Family Medicine

## 2023-05-04 ENCOUNTER — Other Ambulatory Visit (HOSPITAL_COMMUNITY): Payer: Self-pay

## 2023-05-04 ENCOUNTER — Encounter: Payer: Self-pay | Admitting: Family Medicine

## 2023-05-04 VITALS — BP 118/70 | HR 70 | Temp 97.9°F | Ht 69.0 in | Wt 260.5 lb

## 2023-05-04 DIAGNOSIS — U071 COVID-19: Secondary | ICD-10-CM | POA: Diagnosis not present

## 2023-05-04 DIAGNOSIS — I5032 Chronic diastolic (congestive) heart failure: Secondary | ICD-10-CM | POA: Diagnosis not present

## 2023-05-04 DIAGNOSIS — R051 Acute cough: Secondary | ICD-10-CM

## 2023-05-04 DIAGNOSIS — E113559 Type 2 diabetes mellitus with stable proliferative diabetic retinopathy, unspecified eye: Secondary | ICD-10-CM

## 2023-05-04 DIAGNOSIS — J4520 Mild intermittent asthma, uncomplicated: Secondary | ICD-10-CM | POA: Diagnosis not present

## 2023-05-04 LAB — POC COVID19 BINAXNOW: SARS Coronavirus 2 Ag: POSITIVE — AB

## 2023-05-04 LAB — POC INFLUENZA A&B (BINAX/QUICKVUE)
Influenza A, POC: NEGATIVE
Influenza B, POC: NEGATIVE

## 2023-05-04 MED ORDER — NIRMATRELVIR/RITONAVIR (PAXLOVID)TABLET
3.0000 | ORAL_TABLET | Freq: Two times a day (BID) | ORAL | 0 refills | Status: AC
  Filled 2023-05-04: qty 30, 5d supply, fill #0

## 2023-05-04 MED ORDER — PREDNISONE 20 MG PO TABS: ORAL_TABLET | ORAL | 0 refills | Status: DC

## 2023-05-04 MED ORDER — BENZONATATE 200 MG PO CAPS: 200.0000 mg | ORAL_CAPSULE | Freq: Two times a day (BID) | ORAL | 0 refills | Status: DC | PRN

## 2023-05-04 MED ORDER — VENTOLIN HFA 108 (90 BASE) MCG/ACT IN AERS: INHALATION_SPRAY | RESPIRATORY_TRACT | 5 refills | Status: DC

## 2023-05-04 NOTE — Assessment & Plan Note (Signed)
 Chronic, well-controlled.  He is unable to check his blood sugars at home.  He will avoid high carbohydrate drinks and foods while on prednisone.

## 2023-05-04 NOTE — Assessment & Plan Note (Signed)
 Acute, no evidence of fluid overload in exam today.  Continue Bumex as long as able to keep up with usual p.o. intake.

## 2023-05-04 NOTE — Progress Notes (Signed)
 Patient ID: Chad [name], male    DOB: 1955/10/01, 68 y.o.   MRN: [id number]  This visit was conducted in person.  BP 118/70 (BP Location: Left Arm, Patient Position: Sitting, Cuff Size: Large)   Pulse 70   Temp 97.9 F (36.6 C) (Temporal)   Ht 5\' 9"  (1.753 m)   Wt 260 lb 8 oz (118.2 kg)   SpO2 98%   BMI 38.47 kg/m    CC:  Chief Complaint  Patient presents with   Cough    With yellow phlegm   Nasal Congestion   Sore Throat   Fatigue    Subjective:   HPI: Chad [name] is a 68 y.o. male patient of Dr. Sharen Hones with history of first-degree AV block, asthma, chronic diastolic congestive heart failure, coronary artery disease, diabetes type 2, hypertension, liver cirrhosis presenting on 05/04/2023 for Cough (With yellow phlegm), Nasal Congestion, Sore Throat, and Fatigue   Date of onset:  day4 Initial symptoms included nasal congestion, sore throat, Chills Symptoms progressed to cough with yellow phlegm, fatigue  Subjective fever  Trouble sleeping at night with cough. Sleeping in recliner.  Feeling weak overall.  He has been feeling SOB in last 24 hours  Not sure if he is wheeze. Decreased appetite.. was ab drinking water and koolaid , diet drinks > 48 oz in last 24 hours, nml urine output. le to eat last night  On  bumex.  Sick contacts: None COVID testing:   none     He has tried to treat with  Flonase, tylenol, using OTC cough suppressant tussin  Has not abluterol for inhaler.   He does have multiple comorbidities and asthma that put him at increased risk for complications.   Non-smoker.    March 17, 2023 GFR 74    Lab Results  Component Value Date   HGBA1C 6.3 (A) 02/13/2023     Relevant past medical, surgical, family and social history reviewed and updated as indicated. Interim medical history since our last visit reviewed. Allergies and medications reviewed and updated. Outpatient Medications Prior to Visit  Medication Sig Dispense Refill    allopurinol (ZYLOPRIM) 100 MG tablet Take 1 tablet (100 mg total) by mouth daily. Increase as directed 90 tablet 3   aspirin 81 MG chewable tablet Chew 81 mg by mouth daily.     atorvastatin (LIPITOR) 20 MG tablet Take 1 tablet (20 mg total) by mouth daily. 90 tablet 4   bumetanide (BUMEX) 1 MG tablet Take 3 tablets (3 mg total) by mouth daily.     carvedilol (COREG) 3.125 MG tablet TAKE ONE TABLET (3.125 MG TOTAL) BY MOUTH TWO (TWO) TIMES DAILY WITH A MEAL. (STOP METOPROLOL) 60 tablet 5   citalopram (CELEXA) 20 MG tablet Take 1 tablet (20 mg total) by mouth daily. 90 tablet 4   colchicine 0.6 MG tablet TAKE ONE TABLET (0.6 MG TOTAL) BY MOUTH DAILY AS NEEDED (GOUT FLARE). MAY TAKE TWO TABLETS ON FIRST DAY OF GOUT FLARE 60 tablet 0   dapagliflozin propanediol (FARXIGA) 10 MG TABS tablet Take 10 mg by mouth daily before breakfast.     dexlansoprazole (DEXILANT) 60 MG capsule Take 1 capsule (60 mg total) by mouth daily. 90 capsule 4   famotidine (PEPCID) 20 MG tablet Take 1 tablet (20 mg total) by mouth at bedtime. 90 tablet 4   ferrous sulfate 325 (65 FE) MG EC tablet Take 1 tablet (325 mg total) by mouth every other day.  fluticasone (FLONASE) 50 MCG/ACT nasal spray Place 2 sprays into both nostrils daily. 16 g 6   isosorbide mononitrate (IMDUR) 30 MG 24 hr tablet Take by mouth. Takes 2 times a day.     levothyroxine (SYNTHROID) 125 MCG tablet TAKE ONE TAB BY MOUTH ONCE DAILY. TAKE ON AN EMPTY STOMACH WITH A GLASS OF WATER ATLEAST 30-60 MINUTES BEFORE BREAKFAST 90 tablet 4   Magnesium 400 MG TABS Take 400 mg by mouth 2 (two) times daily.     metoCLOPramide (REGLAN) 5 MG tablet TAKE ONE TABLET (5 MG TOTAL) BY MOUTH EVERY EIGHT HOURS AS NEEDED FOR NAUSEA. 90 tablet 1   metolazone (ZAROXOLYN) 2.5 MG tablet Take 1 tablet (2.5 mg total) by mouth once a week. As needed for fluid/swelling     Multiple Vitamin (MULITIVITAMIN WITH MINERALS) TABS Take 1 tablet by mouth daily.     nitroGLYCERIN (NITROSTAT)  0.4 MG SL tablet Place 1 tablet (0.4 mg total) under the tongue every 5 (five) minutes as needed for chest pain. 60 tablet 3   potassium chloride (KLOR-CON) 10 MEQ tablet TAKE ONE TABLET BY MOUTH TWICE A DAY 180 tablet 3   Semaglutide, 2 MG/DOSE, 8 MG/3ML SOPN Inject 2 mg as directed once a week. 9 mL 1   spironolactone (ALDACTONE) 25 MG tablet Take 25 mg by mouth daily.     vitamin B-12 (CYANOCOBALAMIN) 500 MCG tablet Take 1 tablet (500 mcg total) by mouth daily.     vitamin E 400 UNIT capsule Take 400 Units by mouth daily.     benzonatate (TESSALON) 200 MG capsule Take 1 capsule (200 mg total) by mouth 3 (three) times daily as needed for cough. 60 capsule 0   VENTOLIN HFA 108 (90 Base) MCG/ACT inhaler INHALE 2 PUFFS INTO THE LUNGS EVERY 6 HOURS AS NEEDED FOR WHEEZING OR SHORTNESS OF BREATH 18 g 5   No facility-administered medications prior to visit.     Per HPI unless specifically indicated in ROS section below Review of Systems  Constitutional:  Positive for chills and fatigue.  HENT:  Positive for congestion and sore throat. Negative for ear pain and sinus pressure.   Respiratory:  Positive for shortness of breath. Negative for wheezing.    Objective:  BP 118/70 (BP Location: Left Arm, Patient Position: Sitting, Cuff Size: Large)   Pulse 70   Temp 97.9 F (36.6 C) (Temporal)   Ht 5\' 9"  (1.753 m)   Wt 260 lb 8 oz (118.2 kg)   SpO2 98%   BMI 38.47 kg/m   Wt Readings from Last 3 Encounters:  05/04/23 260 lb 8 oz (118.2 kg)  03/06/23 258 lb (117 kg)  02/13/23 258 lb 2 oz (117.1 kg)      Physical Exam Constitutional:      General: He is not in acute distress.    Appearance: Normal appearance. He is well-developed. He is not ill-appearing or toxic-appearing.  HENT:     Head: Normocephalic and atraumatic.     Right Ear: Hearing, tympanic membrane, ear canal and external ear normal. No tenderness. No foreign body. Tympanic membrane is not retracted or bulging.     Left Ear:  Hearing, tympanic membrane, ear canal and external ear normal. No tenderness. No foreign body. Tympanic membrane is not retracted or bulging.     Nose: Nose normal. No mucosal edema or rhinorrhea.     Right Sinus: No maxillary sinus tenderness or frontal sinus tenderness.     Left Sinus: No maxillary  sinus tenderness or frontal sinus tenderness.     Mouth/Throat:     Dentition: Normal dentition. No dental caries.     Pharynx: Uvula midline. No oropharyngeal exudate.     Tonsils: No tonsillar abscesses.  Eyes:     General: Lids are normal. Lids are everted, no foreign bodies appreciated.     Conjunctiva/sclera: Conjunctivae normal.     Pupils: Pupils are equal, round, and reactive to light.  Neck:     Thyroid: No thyroid mass or thyromegaly.     Vascular: No carotid bruit.     Trachea: Trachea and phonation normal.  Cardiovascular:     Rate and Rhythm: Normal rate and regular rhythm.     Pulses: Normal pulses.     Heart sounds: Normal heart sounds, S1 normal and S2 normal. No murmur heard.    No gallop.  Pulmonary:     Effort: Pulmonary effort is normal. No respiratory distress.     Breath sounds: Normal breath sounds. No wheezing, rhonchi or rales.     Comments: Appeared to have slight increased work of breathing after returning from the bathroom, mild tachypnea Improved at rest O2 sat at 98% Abdominal:     General: Bowel sounds are normal.     Palpations: Abdomen is soft.     Tenderness: There is no abdominal tenderness. There is no guarding or rebound.     Hernia: No hernia is present.  Musculoskeletal:     Cervical back: Normal range of motion and neck supple.  Skin:    General: Skin is warm and dry.     Findings: No rash.  Neurological:     Mental Status: He is alert.     Deep Tendon Reflexes: Reflexes are normal and symmetric.  Psychiatric:        Speech: Speech normal.        Behavior: Behavior normal.        Judgment: Judgment normal.       Results for orders  placed or performed in visit on 05/04/23  POC COVID-19   Collection Time: 05/04/23  9:28 AM  Result Value Ref Range   SARS Coronavirus 2 Ag Positive (A) Negative  POC Influenza A&B (Binax test)   Collection Time: 05/04/23  9:40 AM  Result Value Ref Range   Influenza A, POC Negative Negative   Influenza B, POC Negative Negative   *Note: Due to a large number of results and/or encounters for the requested time period, some results have not been displayed. A complete set of results can be found in Results Review.    Assessment and Plan  Acute cough -     POC COVID-19 BinaxNow -     POC Influenza A&B(BINAX/QUICKVUE)  COVID-19 Assessment & Plan: COVID19  Infection < 5 days from onset of symptoms in  vaccinated overweight individual with history of CHF, DM, Asthma, CAD  No clear sign of bacterial infection at this time.   No SOB.  No red flags/need for ER visit or in-person exam at respiratory clinic at this time..    Pt higher risk for COVID complications given  comorbidities. GFR  74 and no medication contraindications.  Start paxlovid 5 day course. Reviewed course of medication and side effect profile with patient in detail. He does report shortness of breath although lung exam is clear.  There is no evidence of fluid overload.  We will treat him with prednisone taper, albuterol inhaler 2 puffs every 4-6 hours as needed and benzonatate.  Encouraged him to keep up with hydration and gradually increase food intake as able.  If SOB begins symptoms worsening.. have low threshold for re-exam, if severe shortness of breath ER visit recommended.  Can monitor Oxygen saturation at home with home monitor if able to obtain.  Go to ER if O2 sat < 90% on room air.   Reviewed home care and provided information through MyChart.  Recommended quarantine 5 days isolation recommended. Return to work/public day 6 and wear mask for 4 more days to complete 10 days. Provided info about prevention of  spread of COVID 19.    Mild intermittent asthma, unspecified whether complicated Assessment & Plan: Acute exacerbation Prednisone taper Albuterol inhaler as needed   Type 2 diabetes mellitus with stable proliferative retinopathy, without long-term current use of insulin, unspecified laterality (HCC) Assessment & Plan: Chronic, well-controlled.  He is unable to check his blood sugars at home.  He will avoid high carbohydrate drinks and foods while on prednisone.   Chronic diastolic CHF (congestive heart failure) (HCC) Assessment & Plan: Acute, no evidence of fluid overload in exam today.  Continue Bumex as long as able to keep up with usual p.o. intake.   Other orders -     nirmatrelvir/ritonavir; Take 3 tablets by mouth 2 (two) times daily for 5 days. Patient GFR is 74.  Dispense: 30 tablet; Refill: 0 -     predniSONE; 3 tabs by mouth daily x 3 days, then 2 tabs by mouth daily x 2 days then 1 tab by mouth daily x 2 days  Dispense: 15 tablet; Refill: 0 -     Benzonatate; Take 1 capsule (200 mg total) by mouth 2 (two) times daily as needed for cough.  Dispense: 20 capsule; Refill: 0 -     Ventolin HFA; INHALE 2 PUFFS INTO THE LUNGS EVERY 6 HOURS AS NEEDED FOR WHEEZING OR SHORTNESS OF BREATH  Dispense: 18 g; Refill: 5    Return if symptoms worsen or fail to improve.   Kerby Nora, MD

## 2023-05-04 NOTE — Assessment & Plan Note (Signed)
 COVID19  Infection < 5 days from onset of symptoms in  vaccinated overweight individual with history of CHF, DM, Asthma, CAD  No clear sign of bacterial infection at this time.   No SOB.  No red flags/need for ER visit or in-person exam at respiratory clinic at this time..    Pt higher risk for COVID complications given  comorbidities. GFR  74 and no medication contraindications.  Start paxlovid 5 day course. Reviewed course of medication and side effect profile with patient in detail. He does report shortness of breath although lung exam is clear.  There is no evidence of fluid overload.  We will treat him with prednisone taper, albuterol inhaler 2 puffs every 4-6 hours as needed and benzonatate. Encouraged him to keep up with hydration and gradually increase food intake as able.  If SOB begins symptoms worsening.. have low threshold for re-exam, if severe shortness of breath ER visit recommended.  Can monitor Oxygen saturation at home with home monitor if able to obtain.  Go to ER if O2 sat < 90% on room air.   Reviewed home care and provided information through MyChart.  Recommended quarantine 5 days isolation recommended. Return to work/public day 6 and wear mask for 4 more days to complete 10 days. Provided info about prevention of spread of COVID 19.

## 2023-05-04 NOTE — Assessment & Plan Note (Signed)
 Acute exacerbation Prednisone taper Albuterol inhaler as needed

## 2023-05-04 NOTE — Telephone Encounter (Signed)
 I've refilled colchicine to use PRN gout flares. However please call patient - he shouldn't take colchicine for 5 days while he takes the paxlovid for COVID (diagnosed today).

## 2023-05-04 NOTE — Telephone Encounter (Signed)
Spoke with pt relaying Dr. G's message. Pt verbalizes understanding.  

## 2023-05-04 NOTE — Patient Instructions (Signed)
 Use albuterol as needed for shortness of breath, wheeze and cough 2 puffs every 4-6 hours.  Complete prednsione and paxlovid course.  While on paxlovid...  do not take colchicine.  Rest, keep up with fluids.  Call if you are worsening, if have severe shotness of breath got ER.

## 2023-05-08 DIAGNOSIS — Z5321 Procedure and treatment not carried out due to patient leaving prior to being seen by health care provider: Secondary | ICD-10-CM | POA: Diagnosis not present

## 2023-05-08 DIAGNOSIS — R079 Chest pain, unspecified: Secondary | ICD-10-CM | POA: Diagnosis not present

## 2023-05-09 DIAGNOSIS — R9431 Abnormal electrocardiogram [ECG] [EKG]: Secondary | ICD-10-CM | POA: Diagnosis not present

## 2023-05-09 DIAGNOSIS — M4322 Fusion of spine, cervical region: Secondary | ICD-10-CM | POA: Diagnosis not present

## 2023-05-09 DIAGNOSIS — Z981 Arthrodesis status: Secondary | ICD-10-CM | POA: Diagnosis not present

## 2023-05-11 ENCOUNTER — Observation Stay (HOSPITAL_COMMUNITY)
Admission: EM | Admit: 2023-05-11 | Discharge: 2023-05-13 | Disposition: A | Discharge status: Home / Self Care | Attending: Internal Medicine | Admitting: Internal Medicine

## 2023-05-11 ENCOUNTER — Ambulatory Visit: Payer: Self-pay

## 2023-05-11 ENCOUNTER — Other Ambulatory Visit: Payer: Self-pay

## 2023-05-11 ENCOUNTER — Emergency Department (HOSPITAL_COMMUNITY)

## 2023-05-11 ENCOUNTER — Encounter (HOSPITAL_COMMUNITY): Payer: Self-pay

## 2023-05-11 DIAGNOSIS — E785 Hyperlipidemia, unspecified: Secondary | ICD-10-CM | POA: Insufficient documentation

## 2023-05-11 DIAGNOSIS — E039 Hypothyroidism, unspecified: Secondary | ICD-10-CM | POA: Insufficient documentation

## 2023-05-11 DIAGNOSIS — I110 Hypertensive heart disease with heart failure: Secondary | ICD-10-CM | POA: Diagnosis not present

## 2023-05-11 DIAGNOSIS — U071 COVID-19: Secondary | ICD-10-CM

## 2023-05-11 DIAGNOSIS — I440 Atrioventricular block, first degree: Secondary | ICD-10-CM | POA: Diagnosis not present

## 2023-05-11 DIAGNOSIS — S2249XA Multiple fractures of ribs, unspecified side, initial encounter for closed fracture: Secondary | ICD-10-CM | POA: Diagnosis not present

## 2023-05-11 DIAGNOSIS — R531 Weakness: Secondary | ICD-10-CM | POA: Diagnosis not present

## 2023-05-11 DIAGNOSIS — G4733 Obstructive sleep apnea (adult) (pediatric): Secondary | ICD-10-CM | POA: Insufficient documentation

## 2023-05-11 DIAGNOSIS — Z96653 Presence of artificial knee joint, bilateral: Secondary | ICD-10-CM | POA: Diagnosis not present

## 2023-05-11 DIAGNOSIS — E119 Type 2 diabetes mellitus without complications: Secondary | ICD-10-CM | POA: Diagnosis not present

## 2023-05-11 DIAGNOSIS — R079 Chest pain, unspecified: Secondary | ICD-10-CM | POA: Diagnosis not present

## 2023-05-11 DIAGNOSIS — E1169 Type 2 diabetes mellitus with other specified complication: Secondary | ICD-10-CM | POA: Diagnosis present

## 2023-05-11 DIAGNOSIS — I4510 Unspecified right bundle-branch block: Secondary | ICD-10-CM

## 2023-05-11 DIAGNOSIS — I10 Essential (primary) hypertension: Secondary | ICD-10-CM | POA: Diagnosis present

## 2023-05-11 DIAGNOSIS — G8929 Other chronic pain: Secondary | ICD-10-CM | POA: Diagnosis present

## 2023-05-11 DIAGNOSIS — R0602 Shortness of breath: Secondary | ICD-10-CM | POA: Diagnosis not present

## 2023-05-11 DIAGNOSIS — Z8616 Personal history of COVID-19: Secondary | ICD-10-CM | POA: Insufficient documentation

## 2023-05-11 DIAGNOSIS — I2510 Atherosclerotic heart disease of native coronary artery without angina pectoris: Secondary | ICD-10-CM | POA: Diagnosis present

## 2023-05-11 DIAGNOSIS — I5032 Chronic diastolic (congestive) heart failure: Secondary | ICD-10-CM | POA: Insufficient documentation

## 2023-05-11 DIAGNOSIS — R0789 Other chest pain: Secondary | ICD-10-CM | POA: Diagnosis not present

## 2023-05-11 DIAGNOSIS — I4430 Unspecified atrioventricular block: Secondary | ICD-10-CM | POA: Diagnosis not present

## 2023-05-11 LAB — CBC
HCT: 51.4 % (ref 39.0–52.0)
Hemoglobin: 18 g/dL — ABNORMAL HIGH (ref 13.0–17.0)
MCH: 30.3 pg (ref 26.0–34.0)
MCHC: 35 g/dL (ref 30.0–36.0)
MCV: 86.5 fL (ref 80.0–100.0)
Platelets: 246 10*3/uL (ref 150–400)
RBC: 5.94 MIL/uL — ABNORMAL HIGH (ref 4.22–5.81)
RDW: 12.9 % (ref 11.5–15.5)
WBC: 9.5 10*3/uL (ref 4.0–10.5)
nRBC: 0 % (ref 0.0–0.2)

## 2023-05-11 LAB — BASIC METABOLIC PANEL
Anion gap: 11 (ref 5–15)
BUN: 19 mg/dL (ref 8–23)
CO2: 29 mmol/L (ref 22–32)
Calcium: 9.2 mg/dL (ref 8.9–10.3)
Chloride: 96 mmol/L — ABNORMAL LOW (ref 98–111)
Creatinine, Ser: 1.25 mg/dL — ABNORMAL HIGH (ref 0.61–1.24)
GFR, Estimated: >60 mL/min (ref >60)
Glucose, Bld: 126 mg/dL — ABNORMAL HIGH (ref 70–99)
Potassium: 3.9 mmol/L (ref 3.5–5.1)
Sodium: 136 mmol/L (ref 135–145)

## 2023-05-11 LAB — C-REACTIVE PROTEIN: CRP: 0.6 mg/dL (ref <1.0)

## 2023-05-11 LAB — TROPONIN I (HIGH SENSITIVITY)
Troponin I (High Sensitivity): 5 ng/L (ref <18)
Troponin I (High Sensitivity): 4 ng/L (ref <18)

## 2023-05-11 LAB — BRAIN NATRIURETIC PEPTIDE: B Natriuretic Peptide: 5.1 pg/mL (ref 0.0–100.0)

## 2023-05-11 MED ORDER — ONDANSETRON HCL 4 MG/2ML IJ SOLN
4.0000 mg | Freq: Four times a day (QID) | INTRAMUSCULAR | Status: DC | PRN
Start: 2023-05-11 — End: 2023-05-13

## 2023-05-11 MED ORDER — ACETAMINOPHEN 325 MG PO TABS
650.0000 mg | ORAL_TABLET | ORAL | Status: DC | PRN
  Administered 2023-05-11 – 2023-05-12 (×2): 650 mg via ORAL
  Filled 2023-05-11 (×2): qty 2

## 2023-05-11 MED ORDER — ASPIRIN 81 MG PO CHEW
324.0000 mg | CHEWABLE_TABLET | ORAL | Status: AC
  Administered 2023-05-11: 324 mg via ORAL
  Filled 2023-05-11: qty 4

## 2023-05-11 MED ORDER — ASPIRIN 300 MG RE SUPP: 300.0000 mg | RECTAL | Status: AC

## 2023-05-11 MED ORDER — MORPHINE SULFATE (PF) 4 MG/ML IV SOLN
4.0000 mg | Freq: Once | INTRAVENOUS | Status: AC
  Administered 2023-05-11: 4 mg via INTRAVENOUS
  Filled 2023-05-11: qty 1

## 2023-05-11 MED ORDER — HEPARIN SODIUM (PORCINE) 5000 UNIT/ML IJ SOLN
5000.0000 [IU] | Freq: Three times a day (TID) | INTRAMUSCULAR | Status: DC
  Administered 2023-05-11 – 2023-05-12 (×4): 5000 [IU] via SUBCUTANEOUS
  Filled 2023-05-11 (×4): qty 1

## 2023-05-11 MED ORDER — ASPIRIN 81 MG PO TBEC
81.0000 mg | DELAYED_RELEASE_TABLET | Freq: Every day | ORAL | Status: DC
  Administered 2023-05-12 – 2023-05-13 (×2): 81 mg via ORAL
  Filled 2023-05-11 (×2): qty 1

## 2023-05-11 MED ORDER — NITROGLYCERIN 0.4 MG SL SUBL
0.4000 mg | SUBLINGUAL_TABLET | SUBLINGUAL | Status: DC | PRN
  Administered 2023-05-11 – 2023-05-13 (×7): 0.4 mg via SUBLINGUAL
  Filled 2023-05-11 (×5): qty 1
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
## 2023-05-11 NOTE — ED Notes (Signed)
 PT given a cup of water approved by physician.

## 2023-05-11 NOTE — ED Notes (Signed)
 Called and placed PT on monitor with CCMD.

## 2023-05-11 NOTE — Telephone Encounter (Signed)
 Chief Complaint: shortness of breath Symptoms: severe sob, "can hardly talk", cough, fatigue, low grade fever Frequency: worsening since last week Pertinent Negatives: Patient denies chest pain Disposition: [x] ED /[] Urgent Care (no appt availability in office) / [] Appointment(In office/virtual)/ []  [facility]/ [] Home Care/ [] Refused Recommended Disposition /[] [facility]/ []  Follow-up with PCP Additional Notes: Patients wife called in stating patient was diagnosed with covid last week and his shortness of breath is worsening. Patients wife reports patient "can hardly talk" because he's so out of breath, is also experiencing fatigue, cough, and low grade fever. Patient was seen in ED 3/17. Wife said that they "only looked at his heart which is fine" but that his SOB is worsening and she is concerned he may have caught something else while in the ED. Per protocol, this RN advised ED at this time. Wife states she prefers to drive patient, but will take him now.    Copied from CRM [id number]. Topic: Clinical - Red Word Triage >> May 11, 2023 11:52 AM Orinda Kenner C wrote: Red Word that prompted transfer to Nurse Triage: [name] Olegario Messier [phone], patient was diagnosis with Covid last week and advised to go to ER if symptoms worsen. Patient went to the ER 05/08/23 and they tested for his heart, and his heart is fine. Patient is a hard to breathe, can't hardly talk, fatigue, low grade fever, no pain. Olegario Messier is asking what to do for patient, please advise. Reason for Disposition  SEVERE difficulty breathing (e.g., struggling for each breath, speaks in single words)  Answer Assessment - Initial Assessment Questions 1. RESPIRATORY STATUS: "Describe your breathing?" (e.g., wheezing, shortness of breath, unable to speak, severe coughing)      Shortness of breath 2. ONSET: "When did this breathing problem begin?"      Since last week, worsening 3. PATTERN "Does the difficult  breathing come and go, or has it been constant since it started?"      constant 4. SEVERITY: "How bad is your breathing?" (e.g., mild, moderate, severe)    - MILD: No SOB at rest, mild SOB with walking, speaks normally in sentences, can lie down, no retractions, pulse < 100.    - MODERATE: SOB at rest, SOB with minimal exertion and prefers to sit, cannot lie down flat, speaks in phrases, mild retractions, audible wheezing, pulse 100-120.    - SEVERE: Very SOB at rest, speaks in single words, struggling to breathe, sitting hunched forward, retractions, pulse > 120      severe 5. RECURRENT SYMPTOM: "Have you had difficulty breathing before?" If Yes, ask: "When was the last time?" and "What happened that time?"      none 6. CARDIAC HISTORY: "Do you have any history of heart disease?" (e.g., heart attack, angina, bypass surgery, angioplasty)      CHF 7. LUNG HISTORY: "Do you have any history of lung disease?"  (e.g., pulmonary embolus, asthma, emphysema)     Asthma, OSA 8. CAUSE: "What do you think is causing the breathing problem?"      Covid diagnosis last week 9. OTHER SYMPTOMS: "Do you have any other symptoms? (e.g., dizziness, runny nose, cough, chest pain, fever)     Fatigue, cough, low grade fever 10. O2 SATURATION MONITOR:  "Do you use an oxygen saturation monitor (pulse oximeter) at home?" If Yes, ask: "What is your reading (oxygen level) today?" "What is your usual oxygen saturation reading?" (e.g., 95%)       unable  Protocols used: Breathing  Difficulty-A-AH

## 2023-05-11 NOTE — ED Notes (Signed)
 PT c/o chest pain. Given a dose of nitroglycerin and PT states it resolved some with this dose but still hurts. I gave a 2nd dose, will check back.

## 2023-05-11 NOTE — ED Triage Notes (Signed)
 Pt BIB EMS CP PRESSURE RADIATING DOWN LEFT ARM, SOB, NAUSEA. RECEIVED 3 NITROGLYCERIN AND 6MG  OF MORPHINE, 324 ASA WITH EMS. PAIN IMPROVED BUT HAS RETURNED. HISTORY OF MI 3 YEARS AGO AND THIS FEELS SIMILAR. BP 126/76, HR 76 RR 24 SPO2 99

## 2023-05-11 NOTE — ED Notes (Signed)
 Went to [facility]

## 2023-05-11 NOTE — ED Notes (Signed)
 PT given a cup of water.

## 2023-05-11 NOTE — ED Provider Notes (Signed)
 [facility] EMERGENCY DEPARTMENT AT [facility] Provider Note   CSN: [id number] Arrival date & time: 05/11/23  1351     History  Chief Complaint  Patient presents with   Chest Pain    Chad [name] is a 68 y.o. male.  The history is provided by the patient, the spouse and medical records. No language interpreter was used.  Chest Pain Pain location:  L chest and L lateral chest Pain quality: aching, crushing, dull and pressure   Pain radiates to:  L shoulder and L arm Pain severity:  Severe Onset quality:  Gradual Duration:  2 days Timing:  Intermittent Progression:  Waxing and waning Chronicity:  Recurrent Relieved by:  Nitroglycerin Worsened by:  Exertion Ineffective treatments:  None tried Associated symptoms: back pain, diaphoresis, fatigue, nausea, palpitations and shortness of breath   Associated symptoms: no abdominal pain, no cough, no headache, no lower extremity edema, no near-syncope, no numbness, no vomiting and no weakness        Home Medications Prior to Admission medications   Medication Sig Start Date End Date Taking? Authorizing Provider  allopurinol (ZYLOPRIM) 100 MG tablet Take 1 tablet (100 mg total) by mouth daily. Increase as directed 06/24/22   Karie Schwalbe, MD  aspirin 81 MG chewable tablet Chew 81 mg by mouth daily.    [provider]  atorvastatin (LIPITOR) 20 MG tablet Take 1 tablet (20 mg total) by mouth daily. 07/25/22   Eustaquio Boyden, MD  benzonatate (TESSALON) 200 MG capsule Take 1 capsule (200 mg total) by mouth 2 (two) times daily as needed for cough. 05/04/23   Bedsole, Amy E, MD  bumetanide (BUMEX) 1 MG tablet Take 3 tablets (3 mg total) by mouth daily. 02/18/22   Joaquim Nam, MD  carvedilol (COREG) 3.125 MG tablet TAKE ONE TABLET (3.125 MG TOTAL) BY MOUTH TWO (TWO) TIMES DAILY WITH A MEAL. (STOP METOPROLOL) 01/18/23   Pyrtle, Carie Caddy, MD  citalopram (CELEXA) 20 MG tablet Take 1 tablet (20 mg total) by mouth  daily. 07/25/22   Eustaquio Boyden, MD  colchicine 0.6 MG tablet TAKE ONE TABLET (0.6 MG TOTAL) BY MOUTH DAILY AS NEEDED (GOUT FLARE). MAY TAKE TWO TABLETS ON FIRST DAY OF GOUT FLARE 05/04/23   Eustaquio Boyden, MD  dapagliflozin propanediol (FARXIGA) 10 MG TABS tablet Take 10 mg by mouth daily before breakfast. 10/08/18   Eustaquio Boyden, MD  dexlansoprazole (DEXILANT) 60 MG capsule Take 1 capsule (60 mg total) by mouth daily. 07/25/22   Eustaquio Boyden, MD  famotidine (PEPCID) 20 MG tablet Take 1 tablet (20 mg total) by mouth at bedtime. 07/25/22   Eustaquio Boyden, MD  ferrous sulfate 325 (65 FE) MG EC tablet Take 1 tablet (325 mg total) by mouth every other day. 08/02/22   Eustaquio Boyden, MD  fluticasone [facility]) 50 MCG/ACT nasal spray Place 2 sprays into both nostrils daily. 03/06/23   Eustaquio Boyden, MD  isosorbide mononitrate (IMDUR) 30 MG 24 hr tablet Take by mouth. Takes 2 times a day. 03/01/23 05/30/23  [provider]  levothyroxine (SYNTHROID) 125 MCG tablet TAKE ONE TAB BY MOUTH ONCE DAILY. TAKE ON AN EMPTY STOMACH WITH A GLASS OF WATER ATLEAST 30-60 MINUTES BEFORE BREAKFAST 02/27/23   Eustaquio Boyden, MD  Magnesium 400 MG TABS Take 400 mg by mouth 2 (two) times daily.    [provider]  metoCLOPramide (REGLAN) 5 MG tablet TAKE ONE TABLET (5 MG TOTAL) BY MOUTH EVERY EIGHT HOURS AS NEEDED  FOR NAUSEA. 03/22/23   Arnaldo Natal, NP  metolazone (ZAROXOLYN) 2.5 MG tablet Take 1 tablet (2.5 mg total) by mouth once a week. As needed for fluid/swelling 06/25/21   Eustaquio Boyden, MD  Multiple Vitamin (MULITIVITAMIN WITH MINERALS) TABS Take 1 tablet by mouth daily.    [provider]  nitroGLYCERIN (NITROSTAT) 0.4 MG SL tablet Place 1 tablet (0.4 mg total) under the tongue every 5 (five) minutes as needed for chest pain. 12/19/16   Delma Freeze, FNP  potassium chloride (KLOR-CON) 10 MEQ tablet TAKE ONE TABLET BY MOUTH TWICE A DAY 07/05/22   Eustaquio Boyden,  MD  predniSONE (DELTASONE) 20 MG tablet 3 tabs by mouth daily x 3 days, then 2 tabs by mouth daily x 2 days then 1 tab by mouth daily x 2 days 05/04/23   Excell Seltzer, MD  Semaglutide, 2 MG/DOSE, 8 MG/3ML SOPN Inject 2 mg as directed once a week. 02/13/23   Eustaquio Boyden, MD  spironolactone (ALDACTONE) 25 MG tablet Take 25 mg by mouth daily.    [provider]  VENTOLIN HFA 108 (90 Base) MCG/ACT inhaler INHALE 2 PUFFS INTO THE LUNGS EVERY 6 HOURS AS NEEDED FOR WHEEZING OR SHORTNESS OF BREATH 05/04/23   Bedsole, Amy E, MD  vitamin B-12 (CYANOCOBALAMIN) 500 MCG tablet Take 1 tablet (500 mcg total) by mouth daily. 03/19/20   Eustaquio Boyden, MD  vitamin E 400 UNIT capsule Take 400 Units by mouth daily.    [provider]      Allergies    Patient has no known allergies.    Review of Systems   Review of Systems  Constitutional:  Positive for diaphoresis and fatigue. Negative for chills.  HENT:  Negative for congestion.   Eyes:  Negative for visual disturbance.  Respiratory:  Positive for shortness of breath. Negative for cough, chest tightness and wheezing.   Cardiovascular:  Positive for chest pain, palpitations and leg swelling. Negative for near-syncope.  Gastrointestinal:  Positive for nausea. Negative for abdominal pain, constipation, diarrhea and vomiting.  Genitourinary:  Negative for dysuria and flank pain.  Musculoskeletal:  Positive for back pain. Negative for neck pain and neck stiffness.  Skin:  Negative for rash and wound.  Neurological:  Negative for weakness, numbness and headaches.  Psychiatric/Behavioral:  Negative for agitation and confusion.   All other systems reviewed and are negative.   Physical Exam Updated Vital Signs BP 138/78   Pulse 80   Temp 97.8 F (36.6 C)   Resp (!) 21   Ht 5\' 9"  (1.753 m)   Wt 118.2 kg   SpO2 100%   BMI 38.48 kg/m  Physical Exam Vitals and nursing note reviewed.  Constitutional:      General: He is not in  acute distress.    Appearance: He is well-developed. He is not ill-appearing, toxic-appearing or diaphoretic.  HENT:     Head: Normocephalic and atraumatic.  Eyes:     Extraocular Movements: Extraocular movements intact.     Conjunctiva/sclera: Conjunctivae normal.     Pupils: Pupils are equal, round, and reactive to light.  Cardiovascular:     Rate and Rhythm: Normal rate and regular rhythm.     Heart sounds: Murmur heard.  Pulmonary:     Effort: Pulmonary effort is normal. No respiratory distress.     Breath sounds: Normal breath sounds. No decreased breath sounds, wheezing, rhonchi or rales.  Chest:     Chest wall: No tenderness.  Abdominal:  Palpations: Abdomen is soft.     Tenderness: There is no abdominal tenderness.  Musculoskeletal:        General: No swelling.     Cervical back: Neck supple.     Right lower leg: No tenderness. Edema present.     Left lower leg: No tenderness. Edema present.  Skin:    General: Skin is warm and dry.     Capillary Refill: Capillary refill takes less than 2 seconds.     Findings: No erythema.  Neurological:     Mental Status: He is alert.  Psychiatric:        Mood and Affect: Mood normal.     ED Results / Procedures / Treatments   Labs (all labs ordered are listed, but only abnormal results are displayed) Labs Reviewed  BASIC METABOLIC PANEL - Abnormal; Notable for the following components:      Result Value   Chloride 96 (*)    Glucose, Bld 126 (*)    Creatinine, Ser 1.25 (*)    All other components within normal limits  CBC - Abnormal; Notable for the following components:   RBC 5.94 (*)    Hemoglobin 18.0 (*)    All other components within normal limits  BRAIN NATRIURETIC PEPTIDE  HIV ANTIBODY (ROUTINE TESTING W REFLEX)  BASIC METABOLIC PANEL  LIPID PANEL  CBC  TROPONIN I (HIGH SENSITIVITY)  TROPONIN I (HIGH SENSITIVITY)    EKG EKG Interpretation Date/Time:  Thursday May 11 2023 14:24:01 EDT Ventricular Rate:   80 PR Interval:  241 QRS Duration:  153 QT Interval:  436 QTC Calculation: 503 R Axis:   192  Text Interpretation: Sinus rhythm Prolonged PR interval Right bundle branch block when compard to prior, similar but new t wave inversion in lead 3. No STEMI Confirmed by Theda Belfast ([id number]) on 05/11/2023 2:28:25 PM  Radiology DG Chest 2 View Result Date: 05/11/2023 CLINICAL DATA:  Chest pain EXAM: CHEST - 2 VIEW COMPARISON:  X-ray 05/24/2021 FINDINGS: No consolidation, pneumothorax or effusion. No edema. Normal cardiopericardial silhouette. Left-sided healed rib fractures. Stable tiny left lung base nodule. Metallic focus along the left hilum. Implanted device. Overlapping cardiac leads. Film is under penetrated. Fixation hardware along the lower cervical spine at the edge of the imaging field. IMPRESSION: No acute cardiopulmonary disease.  Chronic changes Electronically Signed   By: Karen Kays M.D.   On: 05/11/2023 15:33    Procedures Procedures    Medications Ordered in ED Medications  aspirin chewable tablet 324 mg (has no administration in time range)    Or  aspirin suppository 300 mg (has no administration in time range)  aspirin EC tablet 81 mg (has no administration in time range)  nitroGLYCERIN (NITROSTAT) SL tablet 0.4 mg (has no administration in time range)  acetaminophen (TYLENOL) tablet 650 mg (has no administration in time range)  ondansetron (ZOFRAN) injection 4 mg (has no administration in time range)  heparin injection 5,000 Units (has no administration in time range)  morphine (PF) 4 MG/ML injection 4 mg (4 mg Intravenous Given 05/11/23 1505)    ED Course/ Medical Decision Making/ A&P                                 Medical Decision Making Amount and/or Complexity of Data Reviewed Labs: ordered. Radiology: ordered.  Risk Prescription drug management. Decision regarding hospitalization.    CATALINO PLASCENCIA is a 68 y.o. male  with a past medical history  significant for obesity, gastric bypass surgery, cirrhosis, CHF, hypertension, CAD with PCI, diabetes, and recent diagnosis of COVID-19 a week ago who presents with chest pain.  According to patient, for the last several days he has been having chest pain on and off that feels like pressure and soreness in his left chest intermittently sharp.  Goes to his left shoulder and left shoulder blade.  It goes down his left arm.  He reports he feels "the exact same" as his last heart attack.  He reports that he has had normal blood work but when they did a cath he needed stenting.  He says that he has had some diaphoresis and nausea with it.  He has had improvement in the cough he had last week.  Still reports the shortness of breath.  He reports his discomfort is exertional.  He reports chronic mild swelling in his right leg that does not seem any different.  Denies any abdominal pain.  Denies any constipation, diarrhea, or urinary changes.  Denies trauma.  Reports the pain is severe at times.  He reports that after taking 3 nitro the pain has improved.  On exam, lungs are clear.  Chest is nontender.  No rash seen.  Abdomen nontender.  Good pulses in extremities.  Legs are not critically edematous on my exam.  EKG does not show STEMI but does show some T wave changes.  Given the patient's report that this pain feels exactly like his previous MI, will call cardiology to have him get seen.  He will get troponins and labs.  He did have surgery several months ago but is denying any pleuritic discomfort and is not tachycardic or hypoxic.  Have less suspicion for pulmonary embolism based on his description of symptoms and is being so similar to his previous cardiac pain.  Anticipate reassessment after workup and cardiology evaluation to determine disposition.   Cardiology will admit for further management.        Final Clinical Impression(s) / ED Diagnoses Final diagnoses:  Chest pain, unspecified type       Clinical Impression: 1. Chest pain, unspecified type     Disposition: Admit  This note was prepared with assistance of Dragon voice recognition software. Occasional wrong-word or sound-a-like substitutions may have occurred due to the inherent limitations of voice recognition software.     Wynema Garoutte, Canary Brim, MD 05/11/23 [phone]

## 2023-05-12 ENCOUNTER — Encounter (HOSPITAL_COMMUNITY): Payer: Self-pay | Admitting: Cardiology

## 2023-05-12 ENCOUNTER — Observation Stay (HOSPITAL_COMMUNITY)

## 2023-05-12 ENCOUNTER — Ambulatory Visit (HOSPITAL_COMMUNITY)
Admission: EM | Disposition: A | Discharge status: Home / Self Care | Payer: Self-pay | Source: Home / Self Care | Attending: Emergency Medicine

## 2023-05-12 DIAGNOSIS — I110 Hypertensive heart disease with heart failure: Secondary | ICD-10-CM | POA: Diagnosis not present

## 2023-05-12 DIAGNOSIS — E119 Type 2 diabetes mellitus without complications: Secondary | ICD-10-CM | POA: Diagnosis not present

## 2023-05-12 DIAGNOSIS — I440 Atrioventricular block, first degree: Secondary | ICD-10-CM | POA: Diagnosis not present

## 2023-05-12 DIAGNOSIS — E039 Hypothyroidism, unspecified: Secondary | ICD-10-CM | POA: Diagnosis not present

## 2023-05-12 DIAGNOSIS — I4510 Unspecified right bundle-branch block: Secondary | ICD-10-CM | POA: Diagnosis not present

## 2023-05-12 DIAGNOSIS — I5032 Chronic diastolic (congestive) heart failure: Secondary | ICD-10-CM | POA: Diagnosis not present

## 2023-05-12 DIAGNOSIS — I5030 Unspecified diastolic (congestive) heart failure: Secondary | ICD-10-CM

## 2023-05-12 DIAGNOSIS — G4733 Obstructive sleep apnea (adult) (pediatric): Secondary | ICD-10-CM | POA: Diagnosis not present

## 2023-05-12 DIAGNOSIS — R079 Chest pain, unspecified: Secondary | ICD-10-CM | POA: Diagnosis not present

## 2023-05-12 DIAGNOSIS — Z8616 Personal history of COVID-19: Secondary | ICD-10-CM | POA: Diagnosis not present

## 2023-05-12 DIAGNOSIS — Z96653 Presence of artificial knee joint, bilateral: Secondary | ICD-10-CM | POA: Diagnosis not present

## 2023-05-12 DIAGNOSIS — I2510 Atherosclerotic heart disease of native coronary artery without angina pectoris: Secondary | ICD-10-CM | POA: Diagnosis not present

## 2023-05-12 DIAGNOSIS — E785 Hyperlipidemia, unspecified: Secondary | ICD-10-CM | POA: Diagnosis not present

## 2023-05-12 HISTORY — PX: LEFT HEART CATH AND CORONARY ANGIOGRAPHY: CATH118249

## 2023-05-12 HISTORY — PX: CORONARY PRESSURE/FFR STUDY: CATH118243

## 2023-05-12 LAB — CBC
HCT: 46.6 % (ref 39.0–52.0)
HCT: 45.9 % (ref 39.0–52.0)
Hemoglobin: 15.7 g/dL (ref 13.0–17.0)
Hemoglobin: 15.8 g/dL (ref 13.0–17.0)
MCH: 29.7 pg (ref 26.0–34.0)
MCH: 30.4 pg (ref 26.0–34.0)
MCHC: 33.7 g/dL (ref 30.0–36.0)
MCHC: 34.4 g/dL (ref 30.0–36.0)
MCV: 88.1 fL (ref 80.0–100.0)
MCV: 88.4 fL (ref 80.0–100.0)
Platelets: 155 10*3/uL (ref 150–400)
Platelets: 131 10*3/uL — ABNORMAL LOW (ref 150–400)
RBC: 5.29 MIL/uL (ref 4.22–5.81)
RBC: 5.19 MIL/uL (ref 4.22–5.81)
RDW: 13.1 % (ref 11.5–15.5)
RDW: 13 % (ref 11.5–15.5)
WBC: 7.5 10*3/uL (ref 4.0–10.5)
WBC: 8.8 10*3/uL (ref 4.0–10.5)
nRBC: 0 % (ref 0.0–0.2)
nRBC: 0 % (ref 0.0–0.2)

## 2023-05-12 LAB — ECHOCARDIOGRAM COMPLETE
AR max vel: 2.86 cm2
AV Peak grad: 5 mmHg
Ao pk vel: 1.12 m/s
Area-P 1/2: 3.65 cm2
Height: 69 in
S' Lateral: 2.73 cm
Weight: 3943.59 [oz_av]

## 2023-05-12 LAB — CREATININE, SERUM
Creatinine, Ser: 0.92 mg/dL (ref 0.61–1.24)
GFR, Estimated: >60 mL/min (ref >60)

## 2023-05-12 LAB — BASIC METABOLIC PANEL
Anion gap: 11 (ref 5–15)
BUN: 20 mg/dL (ref 8–23)
CO2: 27 mmol/L (ref 22–32)
Calcium: 8.3 mg/dL — ABNORMAL LOW (ref 8.9–10.3)
Chloride: 98 mmol/L (ref 98–111)
Creatinine, Ser: 1.33 mg/dL — ABNORMAL HIGH (ref 0.61–1.24)
GFR, Estimated: 58 mL/min — ABNORMAL LOW (ref >60)
Glucose, Bld: 167 mg/dL — ABNORMAL HIGH (ref 70–99)
Potassium: 3.4 mmol/L — ABNORMAL LOW (ref 3.5–5.1)
Sodium: 136 mmol/L (ref 135–145)

## 2023-05-12 LAB — POCT ACTIVATED CLOTTING TIME: Activated Clotting Time: 285 s

## 2023-05-12 LAB — GLUCOSE, CAPILLARY
Glucose-Capillary: 159 mg/dL — ABNORMAL HIGH (ref 70–99)
Glucose-Capillary: 114 mg/dL — ABNORMAL HIGH (ref 70–99)
Glucose-Capillary: 103 mg/dL — ABNORMAL HIGH (ref 70–99)
Glucose-Capillary: 171 mg/dL — ABNORMAL HIGH (ref 70–99)

## 2023-05-12 LAB — LIPID PANEL
Cholesterol: 95 mg/dL (ref 0–200)
HDL: 30 mg/dL — ABNORMAL LOW (ref >40)
LDL Cholesterol: 36 mg/dL (ref 0–99)
Total CHOL/HDL Ratio: 3.2 ratio
Triglycerides: 146 mg/dL (ref <150)
VLDL: 29 mg/dL (ref 0–40)

## 2023-05-12 LAB — HIV ANTIBODY (ROUTINE TESTING W REFLEX): HIV Screen 4th Generation wRfx: NONREACTIVE

## 2023-05-12 SURGERY — LEFT HEART CATH AND CORONARY ANGIOGRAPHY
Anesthesia: LOCAL

## 2023-05-12 MED ORDER — ADENOSINE 12 MG/4ML IV SOLN
INTRAVENOUS | Status: AC
  Filled 2023-05-12: qty 4

## 2023-05-12 MED ORDER — SODIUM CHLORIDE 0.9 % IV SOLN: INTRAVENOUS | Status: AC

## 2023-05-12 MED ORDER — HYDRALAZINE HCL 20 MG/ML IJ SOLN: 10.0000 mg | INTRAMUSCULAR | Status: AC | PRN

## 2023-05-12 MED ORDER — IOHEXOL 350 MG/ML SOLN
INTRAVENOUS | Status: DC | PRN
  Administered 2023-05-12: 45 mL

## 2023-05-12 MED ORDER — SODIUM CHLORIDE 0.9 % IV SOLN: 250.0000 mL | INTRAVENOUS | Status: DC | PRN

## 2023-05-12 MED ORDER — INSULIN ASPART 100 UNIT/ML IJ SOLN: 0.0000 [IU] | Freq: Three times a day (TID) | INTRAMUSCULAR | Status: DC

## 2023-05-12 MED ORDER — MIDAZOLAM HCL 2 MG/2ML IJ SOLN
INTRAMUSCULAR | Status: AC
  Filled 2023-05-12: qty 2

## 2023-05-12 MED ORDER — HEPARIN SODIUM (PORCINE) 1000 UNIT/ML IJ SOLN
INTRAMUSCULAR | Status: DC | PRN
  Administered 2023-05-12 (×2): 6000 [IU] via INTRAVENOUS

## 2023-05-12 MED ORDER — MIDAZOLAM HCL 2 MG/2ML IJ SOLN
INTRAMUSCULAR | Status: DC | PRN
  Administered 2023-05-12 (×2): 1 mg via INTRAVENOUS

## 2023-05-12 MED ORDER — PERFLUTREN LIPID MICROSPHERE
1.0000 mL | INTRAVENOUS | Status: AC | PRN
  Administered 2023-05-12: 2 mL via INTRAVENOUS

## 2023-05-12 MED ORDER — HEPARIN SODIUM (PORCINE) 1000 UNIT/ML IJ SOLN
INTRAMUSCULAR | Status: AC
  Filled 2023-05-12: qty 10

## 2023-05-12 MED ORDER — NITROGLYCERIN 1 MG/10 ML FOR IR/CATH LAB
INTRA_ARTERIAL | Status: DC | PRN
  Administered 2023-05-12: 200 ug via INTRACORONARY

## 2023-05-12 MED ORDER — SODIUM CHLORIDE 0.9% FLUSH: 3.0000 mL | Freq: Two times a day (BID) | INTRAVENOUS | Status: DC

## 2023-05-12 MED ORDER — ALUM & MAG HYDROXIDE-SIMETH 200-200-20 MG/5ML PO SUSP
15.0000 mL | Freq: Four times a day (QID) | ORAL | Status: DC | PRN
  Administered 2023-05-12: 15 mL via ORAL
  Filled 2023-05-12: qty 30

## 2023-05-12 MED ORDER — VERAPAMIL HCL 2.5 MG/ML IV SOLN
INTRAVENOUS | Status: AC
  Filled 2023-05-12: qty 2

## 2023-05-12 MED ORDER — FENTANYL CITRATE (PF) 100 MCG/2ML IJ SOLN
INTRAMUSCULAR | Status: DC | PRN
  Administered 2023-05-12 (×2): 50 ug via INTRAVENOUS

## 2023-05-12 MED ORDER — ISOSORBIDE MONONITRATE ER 60 MG PO TB24
60.0000 mg | ORAL_TABLET | Freq: Every day | ORAL | Status: DC
  Administered 2023-05-12: 60 mg via ORAL
  Filled 2023-05-12: qty 1

## 2023-05-12 MED ORDER — FENTANYL CITRATE (PF) 100 MCG/2ML IJ SOLN
INTRAMUSCULAR | Status: AC
  Filled 2023-05-12: qty 2

## 2023-05-12 MED ORDER — ADENOSINE 12 MG/4ML IV SOLN
INTRAVENOUS | Status: AC
  Filled 2023-05-12: qty 16

## 2023-05-12 MED ORDER — VERAPAMIL HCL 2.5 MG/ML IV SOLN
INTRAVENOUS | Status: DC | PRN
  Administered 2023-05-12: 10 mL via INTRA_ARTERIAL

## 2023-05-12 MED ORDER — LABETALOL HCL 5 MG/ML IV SOLN: 10.0000 mg | INTRAVENOUS | Status: AC | PRN

## 2023-05-12 MED ORDER — NITROGLYCERIN 1 MG/10 ML FOR IR/CATH LAB
INTRA_ARTERIAL | Status: AC
Start: 2023-05-12 — End: 2023-05-13
  Filled 2023-05-12: qty 10

## 2023-05-12 MED ORDER — SODIUM CHLORIDE 0.9 % WEIGHT BASED INFUSION
1.0000 mL/kg/h | INTRAVENOUS | Status: DC
  Administered 2023-05-12: 1 mL/kg/h via INTRAVENOUS

## 2023-05-12 MED ORDER — ATORVASTATIN CALCIUM 10 MG PO TABS
20.0000 mg | ORAL_TABLET | Freq: Every day | ORAL | Status: DC
  Administered 2023-05-12 – 2023-05-13 (×2): 20 mg via ORAL
  Filled 2023-05-12 (×2): qty 2

## 2023-05-12 MED ORDER — SODIUM CHLORIDE 0.9% FLUSH: 3.0000 mL | INTRAVENOUS | Status: DC | PRN

## 2023-05-12 MED ORDER — POTASSIUM CHLORIDE 20 MEQ PO PACK
40.0000 meq | PACK | Freq: Once | ORAL | Status: AC
  Administered 2023-05-12: 40 meq via ORAL
  Filled 2023-05-12: qty 2

## 2023-05-12 MED ORDER — HEPARIN SODIUM (PORCINE) 5000 UNIT/ML IJ SOLN
5000.0000 [IU] | Freq: Three times a day (TID) | INTRAMUSCULAR | Status: DC
  Administered 2023-05-13: 5000 [IU] via SUBCUTANEOUS
  Filled 2023-05-12: qty 1

## 2023-05-12 MED ORDER — SODIUM CHLORIDE 0.9 % WEIGHT BASED INFUSION: 3.0000 mL/kg/h | INTRAVENOUS | Status: AC

## 2023-05-12 MED ORDER — LIDOCAINE HCL (PF) 1 % IJ SOLN
INTRAMUSCULAR | Status: AC
  Filled 2023-05-12: qty 30

## 2023-05-12 MED ORDER — HEPARIN (PORCINE) IN NACL 1000-0.9 UT/500ML-% IV SOLN
INTRAVENOUS | Status: DC | PRN
  Administered 2023-05-12 (×2): 500 mL

## 2023-05-12 MED ORDER — ADENOSINE (DIAGNOSTIC) 140MCG/KG/MIN
INTRAVENOUS | Status: DC | PRN
  Administered 2023-05-12: 140 ug/kg/min via INTRAVENOUS

## 2023-05-12 MED ORDER — CARVEDILOL 3.125 MG PO TABS
3.1250 mg | ORAL_TABLET | Freq: Two times a day (BID) | ORAL | Status: DC
  Administered 2023-05-12 – 2023-05-13 (×2): 3.125 mg via ORAL
  Filled 2023-05-12 (×2): qty 1

## 2023-05-12 MED ORDER — LIDOCAINE HCL (PF) 1 % IJ SOLN
INTRAMUSCULAR | Status: DC | PRN
  Administered 2023-05-12: 5 mL

## 2023-05-12 SURGICAL SUPPLY — 11 items
CATH INFINITI AMBI 5FR TG (CATHETERS) IMPLANT
CATH LAUNCHER 6FR EBU3.5 (CATHETERS) IMPLANT
DEVICE RAD COMP TR BAND LRG (VASCULAR PRODUCTS) IMPLANT
GLIDESHEATH SLEND A-KIT 6F 22G (SHEATH) IMPLANT
GUIDEWIRE INQWIRE 1.5J.035X260 (WIRE) IMPLANT
GUIDEWIRE PRESSURE X 175 (WIRE) IMPLANT
INQWIRE 1.5J .035X260CM (WIRE) ×1 IMPLANT
KIT ESSENTIALS PG (KITS) IMPLANT
KIT HEMO VALVE WATCHDOG (MISCELLANEOUS) IMPLANT
PACK CARDIAC CATHETERIZATION (CUSTOM PROCEDURE TRAY) ×2 IMPLANT
SET ATX-X65L (MISCELLANEOUS) IMPLANT

## 2023-05-12 NOTE — Progress Notes (Signed)
 Progress Note  Patient Name: Chad [name] Date of Encounter: 05/12/2023 Primary Cardiologist: [facility]  Subjective   Overnight have repeat chest pain that improved with nitroglycerin.  NPO for cath today  Slight increase in creatinine  Vital Signs    Vitals:   05/12/23 0650 05/12/23 0700 05/12/23 0746 05/12/23 0749  BP:  131/78  (!) 144/84  Pulse:  69  73  Resp:  15  20  Temp: 97.8 F (36.6 C)   97.7 F (36.5 C)  TempSrc: Oral   Oral  SpO2:  97%  100%  Weight:   111.8 kg   Height:   5\' 9"  (1.753 m)    No intake or output data in the 24 hours ending 05/12/23 0806 Filed Weights   05/11/23 1359 05/12/23 0746  Weight: 118.2 kg 111.8 kg    Physical Exam   GEN: No acute distress.  Morbid obesity Neck: No JVD Cardiac: RRR, no murmurs, rubs, or gallops.  Respiratory: Clear to auscultation bilaterally. GI: Soft, nontender, non-distended  MS: No edema  Labs   Telemetry: SR 1st HB with one dropped beat   Chemistry Recent Labs  Lab 05/11/23 1418 05/12/23 0140  NA 136 136  K 3.9 3.4*  CL 96* 98  CO2 29 27  GLUCOSE 126* 167*  BUN 19 20  CREATININE 1.25* 1.33*  CALCIUM 9.2 8.3*  GFRNONAA >60 58*  ANIONGAP 11 11     Hematology Recent Labs  Lab 05/11/23 1418 05/12/23 0140  WBC 9.5 7.5  RBC 5.94* 5.29  HGB 18.0* 15.7  HCT 51.4 46.6  MCV 86.5 88.1  MCH 30.3 29.7  MCHC 35.0 33.7  RDW 12.9 13.1  PLT 246 155    BNP Recent Labs  Lab 05/11/23 1453  BNP 5.1     Cardiac Studies   Cardiac Studies & Procedures   ______________________________________________________________________________________________ CARDIAC CATHETERIZATION  CARDIAC CATHETERIZATION 01/18/2017   CARDIAC CATHETERIZATION  CARDIAC CATHETERIZATION 03/24/2015  Narrative  Prox RCA to Mid RCA lesion, 10% stenosed.  Prox LAD to Mid LAD lesion, 25% stenosed.  Prox Cx lesion, 30% stenosed.  The left ventricular systolic function is normal.  1. Nonobstructive CAD. No significant  change since 2014. 2. Normal LV function.  Plan: risk factor modification.  Findings Coronary Findings Diagnostic  Dominance: Right  Left Main Vessel was injected. Vessel is normal in caliber. Vessel is angiographically normal.  Left Anterior Descending Discrete.  Left Circumflex Discrete.  Right Coronary Artery Diffuse.  Intervention  No interventions have been documented.   STRESS TESTS  NM MYOCAR MULTI W/SPECT W 12/29/2016  Narrative Pharmacological myocardial perfusion imaging study with no significant  ischemia Normal wall motion, EF estimated at 45% Depressed EF possibly secondary to GI uptake artifact Small region of fixed apical defect, worse at stress, likely secondary to attenuation artifact No EKG changes concerning for ischemia at peak stress or in recovery. Low risk scan   Signed, Dossie Arbour, MD, Ph.D [facility]   ECHOCARDIOGRAM  ECHOCARDIOGRAM LIMITED BUBBLE STUDY 02/04/2019  Narrative ECHOCARDIOGRAM LIMITED REPORT    Patient Name:   Chad [name] Date of Exam: 02/04/2019 Medical Rec #:  [id number]      Height:       70.0 in Accession #:    [id number]     Weight:       308.1 lb Date of Birth:  1955/09/22       BSA:          2.51 m Patient Age:  63 years       BP:           116/62 mmHg Patient Gender: M              HR:           76 bpm. Exam Location:  [facility]   Procedure: Limited Echo, Limited Color Doppler, Cardiac Doppler and Saline Contrast Bubble Study  Indications:     Dyspnea  History:         Patient has prior history of Echocardiogram examinations.  Sonographer:     Humphrey Rolls RDCS (AE) Referring Phys:  [id number] Salena Saner Diagnosing Phys: Lorine Bears MD   Sonographer Comments: Suboptimal apical window. Image acquisition challenging due to patient body habitus. IMPRESSIONS   1. NORMAL LV systolic function with an EF of 55-60%. 2. Left ventricular diastolic function could not be evaluated. 3. Mildly dilated  left ventricular internal cavity size. 4. The left ventricle has no regional wall motion abnormalities. 5. The mitral valve is grossly normal. Trivial mitral valve regurgitation. No evidence of mitral stenosis. 6. The tricuspid valve is normal in structure. Tricuspid valve regurgitation is not demonstrated. 7. The aortic valve is grossly normal. Aortic valve regurgitation is not visualized. No evidence of aortic valve sclerosis or stenosis. 8. The pulmonic valve was normal in structure. Pulmonic valve regurgitation is not visualized. 9. TR signal is inadequate for assessing pulmonary artery systolic pressure. 10. Agitated saline contrast was given intravenously to evaluate for intracardiac shunting. Saline contrast bubble study was negative, with no evidence of any interatrial shunt. However, the study is very suboptima and the septum was not well seen. 11. The aortic root was not well visualized. 12. Left ventricular ejection fraction, by visual estimation, is 55 to 60%. The left ventricle has normal function. There is borderline left ventricular hypertrophy. 13. Global right ventricle has normal systolic function.The right ventricular size is normal. No increase in right ventricular wall thickness.  FINDINGS Left Ventricle: Left ventricular ejection fraction, by visual estimation, is 55 to 60%. The left ventricle has normal function. The left ventricle has no regional wall motion abnormalities. The left ventricular internal cavity size was mildly dilated left ventricle. There is mildly increased left ventricular wall thickness. Left ventricular diastolic function could not be evaluated. Normal left atrial pressure.  Right Ventricle: The right ventricular size is normal. No increase in right ventricular wall thickness. Global RV systolic function is has normal systolic function.  Left Atrium: Left atrial size was normal in size.  Right Atrium: Right atrial size was normal in size. Right atrial  pressure is estimated at 10 mmHg.  Pericardium: There is no evidence of pericardial effusion.  Mitral Valve: The mitral valve is normal in structure. No evidence of mitral valve stenosis by observation. No evidence of mitral valve regurgitation.  Tricuspid Valve: The tricuspid valve is normal in structure. Tricuspid valve regurgitation is not demonstrated.  Aortic Valve: The aortic valve is grossly normal. The aortic valve is normal in structure. Aortic valve regurgitation is not visualized. The aortic valve is structurally normal, with no evidence of sclerosis or stenosis.  Pulmonic Valve: The pulmonic valve was normal in structure. Pulmonic valve regurgitation is not visualized.  Aorta: The aortic root was not well visualized. The aortic root, ascending aorta and aortic arch are all structurally normal, with no evidence of dilitation or obstruction.  Venous: The inferior vena cava was not well visualized.  Shunts: Agitated saline contrast was given intravenously to evaluate  for intracardiac shunting. Saline contrast bubble study was negative, with no evidence of any interatrial shunt. There is no evidence of a patent foramen ovale. No ventricular septal defect is seen or detected. There is no evidence of an atrial septal defect. No atrial level shunt detected by color flow Doppler.   LEFT VENTRICLE         Normals PLAX 2D LVIDd:         5.34 cm 3.6 cm LVIDs:         4.72 cm 1.7 cm LV PW:         1.19 cm 1.4 cm LV IVS:        0.80 cm 1.3 cm LV SV:         34 ml   79 ml LV SV Index:   12.74   45 ml/m2   LEFT ATRIUM         Index LA diam:    3.30 cm 1.32 cm/m   Lorine Bears MD Electronically signed by Lorine Bears MD Signature Date/Time: 02/04/2019/1:09:45 PMThe mitral valve is normal in structure.    Final          ______________________________________________________________________________________________      Assessment & Plan   Chest pain - DDX is  unstable angina (he notes this was like the pain last time) pericarditis with recent COVID-19- CRP normal making this much less likely), or non cardiac pain; echo today to eval effusion  CAD- consented for LHC today (See 3/20 note) if no obstructive CAD will DC on home meds and needs outpatient follow up with spine and [facility] cardiology, on ASA and imdur and coreg  1st HB with RBBB (BIFB) and one Mobitz 1 beat (nocturnal- asymptomatic) - monitor; will not increase BB further today  HLD- continue home statin   HFpEF- normal cardiomems recently, on bumex and PRN metolazone; held for slight AKI (along with MRA) prior to cath; will DC on home diuretics including SGLT2i  Morbid Obesity- if no further surgeries need through course will resume at home  COVID-19 - he has completed his Paxlovid course  NPO at midnight Full Code If microvascular disease or med management of CAD needed; will make med change and hope to DC 3/22 Cards Primary  For questions or updates, please contact [facility] Please consult [email or web address] for contact info under Cardiology/STEMI.      Riley Lam, MD FASE [facility] Cardiologist [facility]  [address] [place], [address] [place], [place] [address] [phone]  8:06 AM

## 2023-05-12 NOTE — Progress Notes (Signed)
 Echocardiogram 2D Echocardiogram has been performed.  Chad [name] 05/12/2023, 3:52 PM

## 2023-05-12 NOTE — ED Notes (Signed)
 Sent morning labs down

## 2023-05-12 NOTE — Progress Notes (Signed)
 Pt came back to rm 23 from cath lab. Reinitiated tele. Vss. Call bell within reach.   Lawson Radar, RN

## 2023-05-12 NOTE — Interval H&P Note (Signed)
 History and Physical Interval Note:  05/12/2023 5:24 PM  Chad [name]  has presented today for surgery, with the diagnosis of chest pain.  The various methods of treatment have been discussed with the patient and family. After consideration of risks, benefits and other options for treatment, the patient has consented to  Procedure(s): LEFT HEART CATH AND CORONARY ANGIOGRAPHY (N/A) as a surgical intervention.  The patient's history has been reviewed, patient examined, no change in status, stable for surgery.  I have reviewed the patient's chart and labs.  Questions were answered to the patient's satisfaction.     Marky Buresh J Cheynne Virden

## 2023-05-12 NOTE — Care Management Obs Status (Signed)
 MEDICARE OBSERVATION STATUS NOTIFICATION   Patient Details  Name: Chad [name] MRN: [id number] Date of Birth: 1955/03/23   Medicare Observation Status Notification Given:  Yes    Sherilyn Banker 05/12/2023, 1:18 PM

## 2023-05-12 NOTE — TOC CM/SW Note (Signed)
 Transition of Care [facility]) - Inpatient Brief Assessment   Patient Details  Name: Chad [name] MRN: [id number] Date of Birth: 02/09/1956  Transition of Care [facility]) CM/SW Contact:    Leone Haven, RN Phone Number: 05/12/2023, 1:30 PM   Clinical Narrative: From home with spouse, has PCP and insurance on file, states has no HH services in place at this time or DME at home.  States family member will transport them home at [employer] and family is support system, states gets medications from [facility].  Pta self ambulatory .   Transition of Care Asessment: Insurance and Status: Insurance coverage has been reviewed Patient has primary care physician: Yes Home environment has been reviewed: home with wife Prior level of function:: indep Prior/Current Home Services: No current home services Social Drivers of Health Review: SDOH reviewed no interventions necessary Readmission risk has been reviewed: Yes Transition of care needs: no transition of care needs at this time

## 2023-05-12 NOTE — H&P (View-Only) (Signed)
 Progress Note  Patient Name: Chad [name] Date of Encounter: 05/12/2023 Primary Cardiologist: [facility]  Subjective   Overnight have repeat chest pain that improved with nitroglycerin.  NPO for cath today  Slight increase in creatinine  Vital Signs    Vitals:   05/12/23 0650 05/12/23 0700 05/12/23 0746 05/12/23 0749  BP:  131/78  (!) 144/84  Pulse:  69  73  Resp:  15  20  Temp: 97.8 F (36.6 C)   97.7 F (36.5 C)  TempSrc: Oral   Oral  SpO2:  97%  100%  Weight:   111.8 kg   Height:   5\' 9"  (1.753 m)    No intake or output data in the 24 hours ending 05/12/23 0806 Filed Weights   05/11/23 1359 05/12/23 0746  Weight: 118.2 kg 111.8 kg    Physical Exam   GEN: No acute distress.  Morbid obesity Neck: No JVD Cardiac: RRR, no murmurs, rubs, or gallops.  Respiratory: Clear to auscultation bilaterally. GI: Soft, nontender, non-distended  MS: No edema  Labs   Telemetry: SR 1st HB with one dropped beat   Chemistry Recent Labs  Lab 05/11/23 1418 05/12/23 0140  NA 136 136  K 3.9 3.4*  CL 96* 98  CO2 29 27  GLUCOSE 126* 167*  BUN 19 20  CREATININE 1.25* 1.33*  CALCIUM 9.2 8.3*  GFRNONAA >60 58*  ANIONGAP 11 11     Hematology Recent Labs  Lab 05/11/23 1418 05/12/23 0140  WBC 9.5 7.5  RBC 5.94* 5.29  HGB 18.0* 15.7  HCT 51.4 46.6  MCV 86.5 88.1  MCH 30.3 29.7  MCHC 35.0 33.7  RDW 12.9 13.1  PLT 246 155    BNP Recent Labs  Lab 05/11/23 1453  BNP 5.1     Cardiac Studies   Cardiac Studies & Procedures   ______________________________________________________________________________________________ CARDIAC CATHETERIZATION  CARDIAC CATHETERIZATION 01/18/2017   CARDIAC CATHETERIZATION  CARDIAC CATHETERIZATION 03/24/2015  Narrative  Prox RCA to Mid RCA lesion, 10% stenosed.  Prox LAD to Mid LAD lesion, 25% stenosed.  Prox Cx lesion, 30% stenosed.  The left ventricular systolic function is normal.  1. Nonobstructive CAD. No significant  change since 2014. 2. Normal LV function.  Plan: risk factor modification.  Findings Coronary Findings Diagnostic  Dominance: Right  Left Main Vessel was injected. Vessel is normal in caliber. Vessel is angiographically normal.  Left Anterior Descending Discrete.  Left Circumflex Discrete.  Right Coronary Artery Diffuse.  Intervention  No interventions have been documented.   STRESS TESTS  NM MYOCAR MULTI W/SPECT W 12/29/2016  Narrative Pharmacological myocardial perfusion imaging study with no significant  ischemia Normal wall motion, EF estimated at 45% Depressed EF possibly secondary to GI uptake artifact Small region of fixed apical defect, worse at stress, likely secondary to attenuation artifact No EKG changes concerning for ischemia at peak stress or in recovery. Low risk scan   Signed, Dossie Arbour, MD, Ph.D [facility]   ECHOCARDIOGRAM  ECHOCARDIOGRAM LIMITED BUBBLE STUDY 02/04/2019  Narrative ECHOCARDIOGRAM LIMITED REPORT    Patient Name:   Chad [name] Date of Exam: 02/04/2019 Medical Rec #:  [id number]      Height:       70.0 in Accession #:    [id number]     Weight:       308.1 lb Date of Birth:  1955/09/22       BSA:          2.51 m Patient Age:  68 years       BP:           116/62 mmHg Patient Gender: M              HR:           76 bpm. Exam Location:  [facility]   Procedure: Limited Echo, Limited Color Doppler, Cardiac Doppler and Saline Contrast Bubble Study  Indications:     Dyspnea  History:         Patient has prior history of Echocardiogram examinations.  Sonographer:     Humphrey Rolls RDCS (AE) Referring Phys:  [id number] Salena Saner Diagnosing Phys: Lorine Bears MD   Sonographer Comments: Suboptimal apical window. Image acquisition challenging due to patient body habitus. IMPRESSIONS   1. NORMAL LV systolic function with an EF of 55-60%. 2. Left ventricular diastolic function could not be evaluated. 3. Mildly dilated  left ventricular internal cavity size. 4. The left ventricle has no regional wall motion abnormalities. 5. The mitral valve is grossly normal. Trivial mitral valve regurgitation. No evidence of mitral stenosis. 6. The tricuspid valve is normal in structure. Tricuspid valve regurgitation is not demonstrated. 7. The aortic valve is grossly normal. Aortic valve regurgitation is not visualized. No evidence of aortic valve sclerosis or stenosis. 8. The pulmonic valve was normal in structure. Pulmonic valve regurgitation is not visualized. 9. TR signal is inadequate for assessing pulmonary artery systolic pressure. 10. Agitated saline contrast was given intravenously to evaluate for intracardiac shunting. Saline contrast bubble study was negative, with no evidence of any interatrial shunt. However, the study is very suboptima and the septum was not well seen. 11. The aortic root was not well visualized. 12. Left ventricular ejection fraction, by visual estimation, is 55 to 60%. The left ventricle has normal function. There is borderline left ventricular hypertrophy. 13. Global right ventricle has normal systolic function.The right ventricular size is normal. No increase in right ventricular wall thickness.  FINDINGS Left Ventricle: Left ventricular ejection fraction, by visual estimation, is 55 to 60%. The left ventricle has normal function. The left ventricle has no regional wall motion abnormalities. The left ventricular internal cavity size was mildly dilated left ventricle. There is mildly increased left ventricular wall thickness. Left ventricular diastolic function could not be evaluated. Normal left atrial pressure.  Right Ventricle: The right ventricular size is normal. No increase in right ventricular wall thickness. Global RV systolic function is has normal systolic function.  Left Atrium: Left atrial size was normal in size.  Right Atrium: Right atrial size was normal in size. Right atrial  pressure is estimated at 10 mmHg.  Pericardium: There is no evidence of pericardial effusion.  Mitral Valve: The mitral valve is normal in structure. No evidence of mitral valve stenosis by observation. No evidence of mitral valve regurgitation.  Tricuspid Valve: The tricuspid valve is normal in structure. Tricuspid valve regurgitation is not demonstrated.  Aortic Valve: The aortic valve is grossly normal. The aortic valve is normal in structure. Aortic valve regurgitation is not visualized. The aortic valve is structurally normal, with no evidence of sclerosis or stenosis.  Pulmonic Valve: The pulmonic valve was normal in structure. Pulmonic valve regurgitation is not visualized.  Aorta: The aortic root was not well visualized. The aortic root, ascending aorta and aortic arch are all structurally normal, with no evidence of dilitation or obstruction.  Venous: The inferior vena cava was not well visualized.  Shunts: Agitated saline contrast was given intravenously to evaluate  for intracardiac shunting. Saline contrast bubble study was negative, with no evidence of any interatrial shunt. There is no evidence of a patent foramen ovale. No ventricular septal defect is seen or detected. There is no evidence of an atrial septal defect. No atrial level shunt detected by color flow Doppler.   LEFT VENTRICLE         Normals PLAX 2D LVIDd:         5.34 cm 3.6 cm LVIDs:         4.72 cm 1.7 cm LV PW:         1.19 cm 1.4 cm LV IVS:        0.80 cm 1.3 cm LV SV:         34 ml   79 ml LV SV Index:   12.74   45 ml/m2   LEFT ATRIUM         Index LA diam:    3.30 cm 1.32 cm/m   Lorine Bears MD Electronically signed by Lorine Bears MD Signature Date/Time: 02/04/2019/1:09:45 PMThe mitral valve is normal in structure.    Final          ______________________________________________________________________________________________      Assessment & Plan   Chest pain - DDX is  unstable angina (he notes this was like the pain last time) pericarditis with recent COVID-19- CRP normal making this much less likely), or non cardiac pain; echo today to eval effusion  CAD- consented for LHC today (See 3/20 note) if no obstructive CAD will DC on home meds and needs outpatient follow up with spine and [facility] cardiology, on ASA and imdur and coreg  1st HB with RBBB (BIFB) and one Mobitz 1 beat (nocturnal- asymptomatic) - monitor; will not increase BB further today  HLD- continue home statin   HFpEF- normal cardiomems recently, on bumex and PRN metolazone; held for slight AKI (along with MRA) prior to cath; will DC on home diuretics including SGLT2i  Morbid Obesity- if no further surgeries need through course will resume at home  COVID-19 - he has completed his Paxlovid course  NPO at midnight Full Code If microvascular disease or med management of CAD needed; will make med change and hope to DC 3/22 Cards Primary  For questions or updates, please contact [facility] Please consult [email or web address] for contact info under Cardiology/STEMI.      Riley Lam, MD FASE [facility] Cardiologist [facility]  [address] [place], [address] [place], [place] [address] [phone]  8:06 AM

## 2023-05-12 NOTE — Progress Notes (Signed)
 Started delating 1cc of  air out and the site stared bleeding. Injected 4 cc of air in. Pt tolerated fair. Vss.   Lawson Radar, RN

## 2023-05-12 NOTE — Progress Notes (Signed)
 Pt arrived to rm 3E23 from ED. Initiated tele. CHG wipe given. Oriented pt to the unit. Vss. Call bell within reach.   Lawson Radar, RN

## 2023-05-13 ENCOUNTER — Other Ambulatory Visit (HOSPITAL_COMMUNITY): Payer: Self-pay

## 2023-05-13 DIAGNOSIS — E119 Type 2 diabetes mellitus without complications: Secondary | ICD-10-CM | POA: Diagnosis not present

## 2023-05-13 DIAGNOSIS — I5032 Chronic diastolic (congestive) heart failure: Secondary | ICD-10-CM | POA: Diagnosis not present

## 2023-05-13 DIAGNOSIS — I110 Hypertensive heart disease with heart failure: Secondary | ICD-10-CM | POA: Diagnosis not present

## 2023-05-13 DIAGNOSIS — I2510 Atherosclerotic heart disease of native coronary artery without angina pectoris: Secondary | ICD-10-CM | POA: Diagnosis not present

## 2023-05-13 DIAGNOSIS — Z96653 Presence of artificial knee joint, bilateral: Secondary | ICD-10-CM | POA: Diagnosis not present

## 2023-05-13 DIAGNOSIS — Z8616 Personal history of COVID-19: Secondary | ICD-10-CM | POA: Diagnosis not present

## 2023-05-13 DIAGNOSIS — E785 Hyperlipidemia, unspecified: Secondary | ICD-10-CM | POA: Diagnosis not present

## 2023-05-13 DIAGNOSIS — G4733 Obstructive sleep apnea (adult) (pediatric): Secondary | ICD-10-CM | POA: Diagnosis not present

## 2023-05-13 DIAGNOSIS — R079 Chest pain, unspecified: Secondary | ICD-10-CM | POA: Diagnosis not present

## 2023-05-13 DIAGNOSIS — E039 Hypothyroidism, unspecified: Secondary | ICD-10-CM | POA: Diagnosis not present

## 2023-05-13 DIAGNOSIS — I10 Essential (primary) hypertension: Secondary | ICD-10-CM | POA: Diagnosis not present

## 2023-05-13 LAB — CBC
HCT: 45.4 % (ref 39.0–52.0)
Hemoglobin: 15.3 g/dL (ref 13.0–17.0)
MCH: 30 pg (ref 26.0–34.0)
MCHC: 33.7 g/dL (ref 30.0–36.0)
MCV: 89 fL (ref 80.0–100.0)
Platelets: 115 10*3/uL — ABNORMAL LOW (ref 150–400)
RBC: 5.1 MIL/uL (ref 4.22–5.81)
RDW: 13.1 % (ref 11.5–15.5)
WBC: 6.9 10*3/uL (ref 4.0–10.5)
nRBC: 0 % (ref 0.0–0.2)

## 2023-05-13 LAB — BASIC METABOLIC PANEL
Anion gap: 8 (ref 5–15)
BUN: 14 mg/dL (ref 8–23)
CO2: 24 mmol/L (ref 22–32)
Calcium: 8.5 mg/dL — ABNORMAL LOW (ref 8.9–10.3)
Chloride: 104 mmol/L (ref 98–111)
Creatinine, Ser: 1.01 mg/dL (ref 0.61–1.24)
GFR, Estimated: >60 mL/min (ref >60)
Glucose, Bld: 114 mg/dL — ABNORMAL HIGH (ref 70–99)
Potassium: 4.1 mmol/L (ref 3.5–5.1)
Sodium: 136 mmol/L (ref 135–145)

## 2023-05-13 LAB — GLUCOSE, CAPILLARY: Glucose-Capillary: 126 mg/dL — ABNORMAL HIGH (ref 70–99)

## 2023-05-13 MED ORDER — AMLODIPINE BESYLATE 2.5 MG PO TABS
2.5000 mg | ORAL_TABLET | Freq: Every day | ORAL | Status: DC
Start: 2023-05-13 — End: 2023-05-13
  Administered 2023-05-13: 2.5 mg via ORAL
  Filled 2023-05-13: qty 1

## 2023-05-13 MED ORDER — AMLODIPINE BESYLATE 2.5 MG PO TABS
2.5000 mg | ORAL_TABLET | Freq: Every day | ORAL | 1 refills | Status: DC
  Filled 2023-05-13: qty 90, 90d supply, fill #0

## 2023-05-13 NOTE — Discharge Summary (Addendum)
 Discharge Summary    Patient ID: Chad [name] MRN: [id number]; DOB: 1955/04/14  Admit date: 05/11/2023 Discharge date: 05/13/2023  PCP:  Eustaquio Boyden, MD   [facility] Providers Cardiologist: [facility] - Dr. Jaymes Graff  Discharge Diagnoses    Principal Problem:   Chest pain Active Problems:   HTN (hypertension)   Hyperlipidemia associated with type 2 diabetes mellitus (HCC)   CAD (coronary artery disease), native coronary artery   Diagnostic Studies/Procedures    Echocardiogram: 04/2023 IMPRESSIONS     1. Left ventricular ejection fraction, by estimation, is 60 to 65%. Left  ventricular ejection fraction by PLAX is 62 %. The left ventricle has  normal function. The left ventricle has no regional wall motion  abnormalities. There is mild left ventricular  hypertrophy. Left ventricular diastolic parameters are consistent with  Grade I diastolic dysfunction (impaired relaxation).   2. Right ventricular systolic function is normal. The right ventricular  size is mildly enlarged. There is normal pulmonary artery systolic  pressure. The estimated right ventricular systolic pressure is 11.3 mmHg.   3. The mitral valve is grossly normal. No evidence of mitral valve  regurgitation.   4. The aortic valve is tricuspid. Aortic valve regurgitation is not  visualized.   5. Aortic dilatation noted. There is borderline dilatation of the  ascending aorta, measuring 39 mm.   6. The inferior vena cava is normal in size with greater than 50%  respiratory variability, suggesting right atrial pressure of 3 mmHg.   Comparison(s): Changes from prior study are noted. 02/04/2019: LVEF  55-60%.   Cardiac Catheterization: 04/2023  Coronary angiography 05/12/2023: LM: Normal LAD: Mid 40% disease just proximal to patent stent with no restenosis Lcx: OM1 with 20% disease Ramus: No significant disease RCA: Minimal luminal irregularities   LVEDP 9 mmHg Coronary physiology  testing:  Guide catheter: 6 Fr EBU 3.0 Normalization performed in left main coronary artery. IC Nitroglycerin: 200 mcg RFR: 0.96 FFR: 0.89 CFR: 3.4 IMR: 45   Elevated IMR suggests microvascular dysfunction Continue disease specific treatment, especially for diabetes Recommend discontinuing nitrates, as it can worsen chest pain from microvascular dysfunction Outpatient follow up with [facility] cardiology  History of Present Illness     Chad [name] is a 68 y.o. male with past medical history of CAD (s/p DES to mid-LAD in 2018 with patent stent by cath in 03/2017), HTN, HLD, Type 2 DM, OSA and chronic HFpEF who presented to [facility] on 05/11/2023 for evaluation of worsening chest pain and dyspnea on exertion.   Had recently been diagnosed with COVID the week prior and placed on Paxlovid with initial improvement in symptoms but developed worsening chest pain and shortness of breath days prior. Had initially gone to [facility] ED and left AMA after having negative troponin values. Presented back to [facility] ED for recurrent pain as symptoms resembled his prior angina. Hs Troponin values were negative at 4 and 5. BNP normal. EKG showed known RBBB but no acute ST changes. Was admitted for further work-up.   Hospital Course     Consultants: None   CRP was negative and echo showed a preserved EF of 60-65% with no regional WMA and no significant valve abnormalities. He did have borderline dilatation of the aorta at 39 mm but no evidence of a pericardial effusion. He underwent a repeat cardiac catheterization on 05/12/2023 which showed 40% LAD stenosis proximal to his previously placed stent but stent was widely patent and he mad minimal disease  elsewhere. IMR was elevated at 45 and suggested microvascular dysfunction and was recommended to discontinue nitrates.   The following morning, he reported overall feeling well. Still having mild episodes of chest pain at rest but overall improved. No  shortness of breath, orthopnea, PND or pitting edema. His PTA diuretic regimen had been held prior to cardiac catheterization and given stable renal function and electrolytes following his procedure, these will be resumed at his PTA doses at the time of discharge (including Bumex, Spironolactone, Farxiga and PRN Metolazone). Given his microvascular dysfunction, Imdur was discontinued and he was started on Amlodipine 2.5mg  daily. Was examined by Dr. Anne Fu and deemed stable for discharge. He does have upcoming follow-up with his Primary Cardiologist at [facility] in 3 weeks and was encouraged to keep that appointment.   _____________  Discharge Physical Exam and Vitals Blood pressure (!) 115/55, pulse 70, temperature 98.3 F (36.8 C), temperature source Oral, resp. rate 20, height 5\' 9"  (1.753 m), weight 112.9 kg, SpO2 96%.  Filed Weights   05/11/23 1359 05/12/23 0746 05/13/23 0617  Weight: 118.2 kg 111.8 kg 112.9 kg   General: Pleasant male appearing in no acute distress.  Psych: Normal affect. Neuro: Alert and oriented X 3. Moves all extremities spontaneously. HEENT: Normal  Neck: Supple without bruits or JVD. Lungs:  Resp regular and unlabored, CTA without wheezing or rales. Heart: RRR no s3, s4, or murmurs. Abdomen: Soft, non-tender, non-distended, BS + x 4.  Extremities: No clubbing, cyanosis or pitting edema. DP/PT/Radials 2+ and equal bilaterally. Radial cath site stable without ecchymosis or evidence of a hematoma.    Labs & Radiologic Studies    CBC Recent Labs    05/12/23 1854 05/13/23 0234  WBC 8.8 6.9  HGB 15.8 15.3  HCT 45.9 45.4  MCV 88.4 89.0  PLT 131* 115*   Basic Metabolic Panel Recent Labs    04/54/09 0140 05/12/23 1854 05/13/23 0234  NA 136  --  136  K 3.4*  --  4.1  CL 98  --  104  CO2 27  --  24  GLUCOSE 167*  --  114*  BUN 20  --  14  CREATININE 1.33* 0.92 1.01  CALCIUM 8.3*  --  8.5*   Liver Function Tests No results for input(s): "AST", "ALT",  "ALKPHOS", "BILITOT", "PROT", "ALBUMIN" in the last 72 hours. No results for input(s): "LIPASE", "AMYLASE" in the last 72 hours. High Sensitivity Troponin:   Recent Labs  Lab 05/11/23 1418 05/11/23 1601  TROPONINIHS 5 4    BNP Invalid input(s): "POCBNP" D-Dimer No results for input(s): "DDIMER" in the last 72 hours. Hemoglobin A1C No results for input(s): "HGBA1C" in the last 72 hours. Fasting Lipid Panel Recent Labs    05/12/23 0140  CHOL 95  HDL 30*  LDLCALC 36  TRIG 811  CHOLHDL 3.2   Thyroid Function Tests No results for input(s): "TSH", "T4TOTAL", "T3FREE", "THYROIDAB" in the last 72 hours.  Invalid input(s): "FREET3" _____________   DG Chest 2 View Result Date: 05/11/2023 CLINICAL DATA:  Chest pain EXAM: CHEST - 2 VIEW COMPARISON:  X-ray 05/24/2021 FINDINGS: No consolidation, pneumothorax or effusion. No edema. Normal cardiopericardial silhouette. Left-sided healed rib fractures. Stable tiny left lung base nodule. Metallic focus along the left hilum. Implanted device. Overlapping cardiac leads. Film is under penetrated. Fixation hardware along the lower cervical spine at the edge of the imaging field. IMPRESSION: No acute cardiopulmonary disease.  Chronic changes Electronically Signed   By: Piedad Climes.D.  On: 05/11/2023 15:33   Disposition   Pt is being discharged home today in good condition.  Follow-up Plans & Appointments     Follow-up Information     Lazarus Salines, MD Follow up.   Specialty: Cardiology Why: Keep scheduled Cardiology Follow-up in 05/2023. Contact information: [address] [place] [address] [phone]                Discharge Instructions     Diet - low sodium heart healthy   Complete by: As directed    Discharge instructions   Complete by: As directed    PLEASE REMEMBER TO BRING ALL OF YOUR MEDICATIONS TO EACH OF YOUR FOLLOW-UP OFFICE VISITS.  PLEASE ATTEND ALL SCHEDULED FOLLOW-UP APPOINTMENTS.    Activity: Increase activity slowly as tolerated. You may shower, but no soaking baths (or swimming) for 1 week. No driving for 24 hours. No lifting over 10 lbs for 1 week. No sexual activity for 1 week.   You May Return to Work: in 1 week (if applicable)  Wound Care: You may wash cath site gently with soap and water. Keep cath site clean and dry. If you notice pain, swelling, bleeding or pus at your cath site, please call [phone].   Increase activity slowly   Complete by: As directed         Discharge Medications   Allergies as of 05/13/2023   No Known Allergies      Medication List     STOP taking these medications    isosorbide mononitrate 60 MG 24 hr tablet Commonly known as: IMDUR   PAXLOVID PO   predniSONE 20 MG tablet Commonly known as: DELTASONE       TAKE these medications    allopurinol 100 MG tablet Commonly known as: ZYLOPRIM Take 1 tablet (100 mg total) by mouth daily. Increase as directed   amLODipine 2.5 MG tablet Commonly known as: NORVASC Take 1 tablet (2.5 mg total) by mouth daily.   aspirin 81 MG chewable tablet Chew 81 mg by mouth daily.   atorvastatin 20 MG tablet Commonly known as: LIPITOR Take 1 tablet (20 mg total) by mouth daily.   benzonatate 200 MG capsule Commonly known as: TESSALON Take 1 capsule (200 mg total) by mouth 2 (two) times daily as needed for cough.   bumetanide 1 MG tablet Commonly known as: Bumex Take 3 tablets (3 mg total) by mouth daily.   carvedilol 3.125 MG tablet Commonly known as: COREG TAKE ONE TABLET (3.125 MG TOTAL) BY MOUTH TWO (TWO) TIMES DAILY WITH A MEAL. (STOP METOPROLOL)   citalopram 20 MG tablet Commonly known as: CELEXA Take 1 tablet (20 mg total) by mouth daily.   colchicine 0.6 MG tablet TAKE ONE TABLET (0.6 MG TOTAL) BY MOUTH DAILY AS NEEDED (GOUT FLARE). MAY TAKE TWO TABLETS ON FIRST DAY OF GOUT FLARE   cyanocobalamin 500 MCG tablet Commonly known as: VITAMIN B12 Take 1  tablet (500 mcg total) by mouth daily.   dapagliflozin propanediol 10 MG Tabs tablet Commonly known as: FARXIGA Take 10 mg by mouth daily before breakfast.   dexlansoprazole 60 MG capsule Commonly known as: DEXILANT Take 1 capsule (60 mg total) by mouth daily.   famotidine 20 MG tablet Commonly known as: PEPCID Take 1 tablet (20 mg total) by mouth at bedtime.   ferrous sulfate 325 (65 FE) MG EC tablet Take 1 tablet (325 mg total) by mouth every other day.   fluticasone 50 MCG/ACT nasal spray  Commonly known as: FLONASE Place 2 sprays into both nostrils daily.   levothyroxine 125 MCG tablet Commonly known as: SYNTHROID TAKE ONE TAB BY MOUTH ONCE DAILY. TAKE ON AN EMPTY STOMACH WITH A GLASS OF WATER ATLEAST 30-60 MINUTES BEFORE BREAKFAST   Magnesium 400 MG Tabs Take 400 mg by mouth 2 (two) times daily.   metoCLOPramide 5 MG tablet Commonly known as: REGLAN TAKE ONE TABLET (5 MG TOTAL) BY MOUTH EVERY EIGHT HOURS AS NEEDED FOR NAUSEA.   metolazone 2.5 MG tablet Commonly known as: ZAROXOLYN Take 1 tablet (2.5 mg total) by mouth once a week. As needed for fluid/swelling   multivitamin with minerals Tabs tablet Take 1 tablet by mouth daily.   nitroGLYCERIN 0.4 MG SL tablet Commonly known as: Nitrostat Place 1 tablet (0.4 mg total) under the tongue every 5 (five) minutes as needed for chest pain.   ondansetron 4 MG disintegrating tablet Commonly known as: ZOFRAN-ODT Take 4 mg by mouth every 6 (six) hours as needed.   potassium chloride 10 MEQ tablet Commonly known as: KLOR-CON TAKE ONE TABLET BY MOUTH TWICE A DAY   Semaglutide (2 MG/DOSE) 8 MG/3ML Sopn Inject 2 mg as directed once a week.   spironolactone 25 MG tablet Commonly known as: ALDACTONE Take 25 mg by mouth daily.   Ventolin HFA 108 (90 Base) MCG/ACT inhaler Generic drug: albuterol INHALE 2 PUFFS INTO THE LUNGS EVERY 6 HOURS AS NEEDED FOR WHEEZING OR SHORTNESS OF BREATH   vitamin E 180 MG (400 UNITS)  capsule Take 400 Units by mouth daily.           Outstanding Labs/Studies   None  Duration of Discharge Encounter: APP Time: 33 minutes   Signed, Ellsworth Lennox, PA-C 05/13/2023, 8:24 AM  Personally seen and examined. Agree with above.  68 year old male who came in with worsening chest pain dyspnea recently diagnosed with COVID normal troponin EF 65% CRP negative cardiac catheterization with patent LAD stent did have some mild to moderate 40% LAD stenosis he also had a study for microvascular dysfunction and his IMR was elevated at 45.  On exam ambulating well.  Lungs are clear wrist is normal post catheterization excellent distal pulses.  Cardiac catheterization personally reviewed.  Please see report for details as above.  LVEDP 9 mmHg.  No significant valvular disease on echocardiogram.  Assessment and plan microvascular angina -We will stop long-acting nitrate and utilize amlodipine 2.5 mg once a day. -He does feel like he gets some relief from short acting nitrate periodically and I am comfortable with him utilizing this on an as-needed basis if this does help him with his symptoms. -Continue conservative management for his shortness of breath which may be COVID-related.  Continue Mediterranean diet and exercise. -Continue with strict management of his type 2 diabetes hyperlipidemia hypertension.  LDL goal less than 70.  He is also diagnosed with obstructive sleep apnea as well. -Continue with atorvastatin.  Aspirin 81 mg. -Also on Farxiga carvedilol 3.125 restart amlodipine 2.5 spironolactone 25 mg  Okay for discharge.  MD time-20 minutes spent with patient, review of medical records, review of report.  Donato Schultz, MD

## 2023-05-13 NOTE — Progress Notes (Signed)
 Cardiac and Pulmonary Rehab PHASE I   PRE:  Rate/Rhythm: NSR    BP: sitting WNL    SaO2: WNL  MODE:  Ambulation: 20 ft        Pt resting in bed, wife in recliner. Pt and wife educated about post cath wound and restrictions, heart healthy diet, exercise, nitro, outpatient cardiac rehab, and medication and follow up MD appointment compliance. All questions were answered and information handouts provided. Pt will be referred to cardiac rehab at [facility]. Pt understands without assistance. Pt with GI issues this am. Independent to restroom x2 while educating. Call bell in reach, no other needs expressed.    Service time is from 0822 to 0841   Essie Hart, RN, BSN [facility]  05/13/2023 9:58 AM

## 2023-05-14 LAB — LIPOPROTEIN A (LPA): Lipoprotein (a): 143.4 nmol/L — ABNORMAL HIGH (ref <75.0)

## 2023-05-15 ENCOUNTER — Encounter: Payer: Self-pay | Admitting: Family Medicine

## 2023-05-15 ENCOUNTER — Ambulatory Visit (INDEPENDENT_AMBULATORY_CARE_PROVIDER_SITE_OTHER): Payer: Medicare Other | Admitting: Family Medicine

## 2023-05-15 VITALS — BP 126/80 | HR 78 | Temp 97.7°F | Ht 69.0 in | Wt 251.0 lb

## 2023-05-15 DIAGNOSIS — Z7984 Long term (current) use of oral hypoglycemic drugs: Secondary | ICD-10-CM

## 2023-05-15 DIAGNOSIS — Z7985 Long-term (current) use of injectable non-insulin antidiabetic drugs: Secondary | ICD-10-CM | POA: Diagnosis not present

## 2023-05-15 DIAGNOSIS — R252 Cramp and spasm: Secondary | ICD-10-CM | POA: Diagnosis not present

## 2023-05-15 DIAGNOSIS — E1169 Type 2 diabetes mellitus with other specified complication: Secondary | ICD-10-CM

## 2023-05-15 DIAGNOSIS — E113559 Type 2 diabetes mellitus with stable proliferative diabetic retinopathy, unspecified eye: Secondary | ICD-10-CM | POA: Diagnosis not present

## 2023-05-15 DIAGNOSIS — E785 Hyperlipidemia, unspecified: Secondary | ICD-10-CM

## 2023-05-15 DIAGNOSIS — I2510 Atherosclerotic heart disease of native coronary artery without angina pectoris: Secondary | ICD-10-CM | POA: Diagnosis not present

## 2023-05-15 LAB — BASIC METABOLIC PANEL
BUN: 16 mg/dL (ref 6–23)
CO2: 28 meq/L (ref 19–32)
Calcium: 10 mg/dL (ref 8.4–10.5)
Chloride: 95 meq/L — ABNORMAL LOW (ref 96–112)
Creatinine, Ser: 1.11 mg/dL (ref 0.40–1.50)
GFR: 68.37 mL/min (ref >60.00)
Glucose, Bld: 127 mg/dL — ABNORMAL HIGH (ref 70–99)
Potassium: 4.1 meq/L (ref 3.5–5.1)
Sodium: 135 meq/L (ref 135–145)

## 2023-05-15 LAB — CK: Total CK: 41 U/L (ref 7–232)

## 2023-05-15 LAB — MAGNESIUM: Magnesium: 2 mg/dL (ref 1.5–2.5)

## 2023-05-15 MED FILL — Adenosine IV Soln 12 MG/4ML: INTRAVENOUS | Qty: 4 | Status: AC

## 2023-05-15 NOTE — Assessment & Plan Note (Signed)
 Chronic, now on ozempic 2mg  weekly. Obesity complicated by comorbidities of diabetes and CAD.

## 2023-05-15 NOTE — Assessment & Plan Note (Addendum)
 Ongoing, intermittent.  Check K Mg and CPK (in statin use) to r/o reversible causes.

## 2023-05-15 NOTE — Progress Notes (Signed)
 Ph: [phone] Fax: [phone]   Patient ID: ADREAN HEITZ, male    DOB: [birth year], 68 y.o.   MRN: [id number]  This visit was conducted in person.  BP 126/80   Pulse 78   Temp 97.7 F (36.5 C) (Oral)   Ht 5\' 9"  (1.753 m)   Wt 251 lb (113.9 kg)   SpO2 98%   BMI 37.07 kg/m    CC: hosp f/u visit  Subjective:   HPI: ARIYON MITTLEMAN is a 68 y.o. male presenting on 05/15/2023 for Hospitalization Follow-up (Admitted on 05/11/23 at [facility], dx chest pain. Also, here for 3 mo f/u. Pt accompanied by wife, Olegario Messier.)   Recent hospitalization for worsening chest pain and exertional dyspnea.  He was diagnosed with COVID on 05/04/2023, s/p treatment with Paxlovid. Hospital records reviewed. Med rec performed.  Underwent repeat cardiac catheterization on 05/12/2023 showing 40% LAD stenosis proximal to previous stent, with widely patent stent. IMR suggested microvascular dysfunction (elevated at 45). Imdur was discontinued, amlodipine 2.5mg  was started with planned f/u with primary [facility] cardiology team (Dr Jaymes Graff).  Lp(a) was elevated to 143.   DM - does not regularly check sugars but notes they were well controlled in the hospital. Compliant with antihyperglycemic regimen which includes: farxiga 10mg  daily, ozempic 2mg  weekly. Denies low sugars or hypoglycemic symptoms. Denies blurry vision. Does note toe paresthesias Last diabetic eye exam DUE. Glucometer brand: accu-chek. Last foot exam: 01/2023. DSME: . Lab Results  Component Value Date   HGBA1C 6.3 (A) 02/13/2023   Diabetic Foot Exam - Simple   No data filed    Lab Results  Component Value Date   MICROALBUR <0.7 07/25/2022    He recently completed C5/6 ACDF on 03/16/2023 (Dr Adriana Simas). He is recovering well from this.   He notes some leg cramping over last few days- was found to have low potassium levels despite Klor-con bid. This was repleted during the hospital.  Lab Results  Component Value Date   CKTOTAL 67 02/03/2014     Home health not set up.  Other follow up appointments scheduled: 06/06/2023 [facility] cardiology ______________________________________________________________________ Hospital admission: 05/11/2023 Hospital discharge: 05/13/2023 TCM f/u phone call: not performed   Principal Problem:   Chest pain Active Problems:   HTN (hypertension)   Hyperlipidemia associated with type 2 diabetes mellitus (HCC)   CAD (coronary artery disease), native coronary artery     Relevant past medical, surgical, family and social history reviewed and updated as indicated. Interim medical history since our last visit reviewed. Allergies and medications reviewed and updated. Outpatient Medications Prior to Visit  Medication Sig Dispense Refill   allopurinol (ZYLOPRIM) 100 MG tablet Take 1 tablet (100 mg total) by mouth daily. Increase as directed 90 tablet 3   amLODipine (NORVASC) 2.5 MG tablet Take 1 tablet (2.5 mg total) by mouth daily. 90 tablet 1   aspirin 81 MG chewable tablet Chew 81 mg by mouth daily.     atorvastatin (LIPITOR) 20 MG tablet Take 1 tablet (20 mg total) by mouth daily. 90 tablet 4   benzonatate (TESSALON) 200 MG capsule Take 1 capsule (200 mg total) by mouth 2 (two) times daily as needed for cough. 20 capsule 0   carvedilol (COREG) 3.125 MG tablet TAKE ONE TABLET (3.125 MG TOTAL) BY MOUTH TWO (TWO) TIMES DAILY WITH A MEAL. (STOP METOPROLOL) 60 tablet 5   citalopram (CELEXA) 20 MG tablet Take 1 tablet (20 mg total) by mouth daily. 90 tablet 4  colchicine 0.6 MG tablet TAKE ONE TABLET (0.6 MG TOTAL) BY MOUTH DAILY AS NEEDED (GOUT FLARE). MAY TAKE TWO TABLETS ON FIRST DAY OF GOUT FLARE 60 tablet 0   dapagliflozin propanediol (FARXIGA) 10 MG TABS tablet Take 10 mg by mouth daily before breakfast.     dexlansoprazole (DEXILANT) 60 MG capsule Take 1 capsule (60 mg total) by mouth daily. 90 capsule 4   famotidine (PEPCID) 20 MG tablet Take 1 tablet (20 mg total) by mouth at bedtime. 90 tablet 4    ferrous sulfate 325 (65 FE) MG EC tablet Take 1 tablet (325 mg total) by mouth every other day.     fluticasone (FLONASE) 50 MCG/ACT nasal spray Place 2 sprays into both nostrils daily. 16 g 6   levothyroxine (SYNTHROID) 125 MCG tablet TAKE ONE TAB BY MOUTH ONCE DAILY. TAKE ON AN EMPTY STOMACH WITH A GLASS OF WATER ATLEAST 30-60 MINUTES BEFORE BREAKFAST 90 tablet 4   Magnesium 400 MG TABS Take 400 mg by mouth 2 (two) times daily.     metoCLOPramide (REGLAN) 5 MG tablet TAKE ONE TABLET (5 MG TOTAL) BY MOUTH EVERY EIGHT HOURS AS NEEDED FOR NAUSEA. 90 tablet 1   metolazone (ZAROXOLYN) 2.5 MG tablet Take 1 tablet (2.5 mg total) by mouth once a week. As needed for fluid/swelling     Multiple Vitamin (MULITIVITAMIN WITH MINERALS) TABS Take 1 tablet by mouth daily.     nitroGLYCERIN (NITROSTAT) 0.4 MG SL tablet Place 1 tablet (0.4 mg total) under the tongue every 5 (five) minutes as needed for chest pain. 60 tablet 3   ondansetron (ZOFRAN-ODT) 4 MG disintegrating tablet Take 4 mg by mouth every 6 (six) hours as needed.     potassium chloride (KLOR-CON) 10 MEQ tablet TAKE ONE TABLET BY MOUTH TWICE A DAY 180 tablet 3   Semaglutide, 2 MG/DOSE, 8 MG/3ML SOPN Inject 2 mg as directed once a week. 9 mL 1   spironolactone (ALDACTONE) 25 MG tablet Take 25 mg by mouth daily.     VENTOLIN HFA 108 (90 Base) MCG/ACT inhaler INHALE 2 PUFFS INTO THE LUNGS EVERY 6 HOURS AS NEEDED FOR WHEEZING OR SHORTNESS OF BREATH 18 g 5   vitamin B-12 (CYANOCOBALAMIN) 500 MCG tablet Take 1 tablet (500 mcg total) by mouth daily.     vitamin E 400 UNIT capsule Take 400 Units by mouth daily.     bumetanide (BUMEX) 1 MG tablet Take 3 tablets (3 mg total) by mouth daily.     bumetanide (BUMEX) 1 MG tablet Take 2 tablets (2 mg total) by mouth daily AND 1 tablet (1 mg total) at bedtime.     No facility-administered medications prior to visit.     Per HPI unless specifically indicated in ROS section below Review of Systems  Objective:   BP 126/80   Pulse 78   Temp 97.7 F (36.5 C) (Oral)   Ht 5\' 9"  (1.753 m)   Wt 251 lb (113.9 kg)   SpO2 98%   BMI 37.07 kg/m   Wt Readings from Last 3 Encounters:  05/15/23 251 lb (113.9 kg)  05/13/23 249 lb (112.9 kg)  05/04/23 260 lb 8 oz (118.2 kg)      Physical Exam Vitals and nursing note reviewed.  Constitutional:      Appearance: Normal appearance. He is not ill-appearing.  HENT:     Mouth/Throat:     Mouth: Mucous membranes are moist.     Pharynx: Oropharynx is clear. No oropharyngeal exudate  or posterior oropharyngeal erythema.  Eyes:     General:        Right eye: No discharge.        Left eye: No discharge.     Conjunctiva/sclera: Conjunctivae normal.     Pupils: Pupils are equal, round, and reactive to light.  Cardiovascular:     Rate and Rhythm: Normal rate and regular rhythm.     Pulses: Normal pulses.     Heart sounds: Normal heart sounds. No murmur heard. Pulmonary:     Effort: Pulmonary effort is normal. No respiratory distress.     Breath sounds: Normal breath sounds. No wheezing, rhonchi or rales.  Musculoskeletal:     Right lower leg: No edema.     Left lower leg: No edema.  Skin:    General: Skin is warm and dry.  Neurological:     Mental Status: He is alert.  Psychiatric:        Mood and Affect: Mood normal.        Behavior: Behavior normal.       Lab Results  Component Value Date   NA 136 05/13/2023   CL 104 05/13/2023   K 4.1 05/13/2023   CO2 24 05/13/2023   BUN 14 05/13/2023   CREATININE 1.01 05/13/2023   GFRNONAA >60 05/13/2023   CALCIUM 8.5 (L) 05/13/2023   PHOS 3.5 01/30/2018   ALBUMIN 4.8 07/25/2022   GLUCOSE 114 (H) 05/13/2023    Assessment & Plan:   Problem List Items Addressed This Visit     Diabetes mellitus type 2 with retinopathy (HCC)   Overdue for diabetic eye exam in h/o retinopathy - encouraged he call and schedule appt.       Severe obesity (BMI 35.0-39.9) with comorbidity (HCC)   Chronic, now on ozempic  2mg  weekly. Obesity complicated by comorbidities of diabetes and CAD.       CAD (coronary artery disease), native coronary artery   Recent cardiac catheterization at [facility] with 40% stenosis of LAD proximal to prior patent stent.  Imdur stopped, amlodipine started. Planned only PRN SL nitroglycerin Keep primary cardiology f/u 06/06/2023. Marland Kitchen       Relevant Medications   bumetanide (BUMEX) 1 MG tablet   Type 2 diabetes mellitus with other specified complication (HCC) - Primary   Chronic, well controlled based on recent cbg's in hospital.  Continues ozempic and farxiga.       Leg cramping   Ongoing, intermittent.  Check K Mg and CPK (in statin use) to r/o reversible causes.       Relevant Orders   Basic metabolic panel   Magnesium   CK     No orders of the defined types were placed in this encounter.   Orders Placed This Encounter  Procedures   Basic metabolic panel   Magnesium   CK    Patient Instructions  Schedule eye exam as you're due.  Labs today Good to see you today Return in 3 months for physical, prior with fasting labs   Follow up plan: Return in about 3 months (around 08/15/2023) for follow up visit.  Eustaquio Boyden, MD

## 2023-05-15 NOTE — Assessment & Plan Note (Signed)
 Recent cardiac catheterization at [facility] with 40% stenosis of LAD proximal to prior patent stent.  Imdur stopped, amlodipine started. Planned only PRN SL nitroglycerin Keep primary cardiology f/u 06/06/2023. Chad [name]

## 2023-05-15 NOTE — Assessment & Plan Note (Signed)
 Chronic, well controlled based on recent cbg's in hospital.  Continues ozempic and farxiga.

## 2023-05-15 NOTE — Assessment & Plan Note (Addendum)
 Overdue for diabetic eye exam in h/o retinopathy - encouraged he call and schedule appt.

## 2023-05-15 NOTE — Telephone Encounter (Signed)
 Seen in office today

## 2023-05-15 NOTE — Patient Instructions (Addendum)
 Schedule eye exam as you're due.  Labs today Good to see you today Return in 3 months for physical, prior with fasting labs

## 2023-05-16 ENCOUNTER — Encounter: Payer: Self-pay | Admitting: Family Medicine

## 2023-05-17 ENCOUNTER — Other Ambulatory Visit: Payer: Self-pay | Admitting: Nurse Practitioner

## 2023-05-24 ENCOUNTER — Ambulatory Visit: Payer: Medicare Other | Admitting: Dermatology

## 2023-05-25 ENCOUNTER — Ambulatory Visit

## 2023-05-25 VITALS — BP 126/80 | Ht 69.0 in | Wt 244.0 lb

## 2023-05-25 DIAGNOSIS — Z0000 Encounter for general adult medical examination without abnormal findings: Secondary | ICD-10-CM

## 2023-05-25 DIAGNOSIS — Z2821 Immunization not carried out because of patient refusal: Secondary | ICD-10-CM
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
## 2023-05-25 NOTE — Patient Instructions (Signed)
 [name] , Thank you for taking time to come for your Medicare Wellness Visit. I appreciate your ongoing commitment to your health goals. Please review the following plan we discussed and let me know if I can assist you in the future.   Referrals/Orders/Follow-Ups/Clinician Recommendations: follow up in 1 year  This is a list of the screening recommended for you and due dates:  Health Maintenance  Topic Date Due   Eye exam for diabetics  12/19/2020   DTaP/Tdap/Td vaccine (2 - Tdap) 01/30/2021   Stool Blood Test  02/16/2021   Pneumonia Vaccine (3 of 3 - PPSV23 or PCV20) 03/18/2021   COVID-19 Vaccine (4 - 2024-25 season) 10/23/2022   Yearly kidney health urinalysis for diabetes  07/25/2023   Hemoglobin A1C  08/14/2023   Flu Shot  09/22/2023   Complete foot exam   02/13/2024   Yearly kidney function blood test for diabetes  05/14/2024   Medicare Annual Wellness Visit  05/24/2024   Colon Cancer Screening  02/16/2025   Hepatitis C Screening  Completed   Zoster (Shingles) Vaccine  Completed   HPV Vaccine  Aged Out    Advanced directives: (Declined) Advance directive discussed with you today. Even though you declined this today, please call our office should you change your mind, and we can give you the proper paperwork for you to fill out.  Next Medicare Annual Wellness Visit scheduled for next year: Yes

## 2023-06-09 DIAGNOSIS — I25118 Atherosclerotic heart disease of native coronary artery with other forms of angina pectoris: Secondary | ICD-10-CM | POA: Diagnosis not present

## 2023-06-09 DIAGNOSIS — E782 Mixed hyperlipidemia: Secondary | ICD-10-CM | POA: Diagnosis not present

## 2023-06-09 DIAGNOSIS — I10 Essential (primary) hypertension: Secondary | ICD-10-CM | POA: Diagnosis not present

## 2023-06-09 NOTE — Progress Notes (Signed)
 Cardiology Clinic Follow Up Note  Date of Service: 06/09/2023  PCP: Referring Provider:  Rilla Baller, MD [address] [place] [address] Phone: [phone] Fax: [phone] Rilla Baller, MD [address] [place],  [place] [address] Phone: [phone] Fax: [phone]   Reason for visit:  This 68 y.o. male is seen for follow up regarding pre-operative clearance for spinal surgery    ASSESSMENT AND PLAN   CAD s/p PCI to mLAD 12/2016  Chest Pain: LHC 03/2017 showed patent mid-LAD stent and no other obstructive lesions.  PET stress in May 2024 that shows no evidence of significant ischemia or scar. Patient with recent angina event s/p LHC 05/12/2023 at [facility] with patent LA stent and non flow limiting coronary artery disease with discontinuation of nitrate therapy due to concern for microvascular diease. Patient was started on amlodipine  2.5 mg daily and switched from metoprolol  to carvedilol . Patient with persistent worse angina symptoms.  - c/w ASA 81 mg daily - increase carvedilol  to 6.25 mg BID - c/w amlodipine  2.5 mg daily  - c/w atorvastatin  20 mg daily - Restart Imdur  60 mg daily   HTN: Clinic BP today 114/63 - Continue carvedilol  6.25 mg BID - c/w Spiro 25 every day - As above is on Imdur  60 mg daily   HFpEF: 05/2017 RHC normal pulmonary pressures, PCWP 5, and PVR 1.2 Wood units. Appears euvolemic on exam today in clinic. Has CardioMems and new pillow however is not compliant with transmissions. Reports no significant volume overload. Dry weight reported at 248 lbs (255 lbs per patient), was 253 lbs today with close. - advised on increase cardiomems transmission including today - Continue Bumex  3 mg daily  - c/w Metolazone  2.5 prn - c/w farxiga  10 mg daily - c/w spironolactone  25 mg daily    IDDM2 - 07/2019 A1c 5.1 - No longer requiring insulin  or Metformin  -Managed by PCP -Continue dapagliflozin , Ozempic   Follow up with Cassandra Ram on  07/18/2023 for angina symptoms.   I personally spent 39 minutes face-to-face and non-face-to-face in the care of this patient, which includes all pre, intra, and post visit time on the date of service.  This note was generated using speech recognition software and may contain homophonic word substitutions or errors.  Toni Cool, MD Assistant Professor of Medicine Division of Cardiology      History of Present Illness Chad [name], with a history of coronary artery disease s/p PCI to mLAD 12/2016 for unstable angina with chronic chest pain, HFpEF, HTN, HLD, DM2, history of gastric bypass, gastroparesis and cervical spine fusion surgery, presents with worsening chest pain for the past week. The pain is described as pressure-like, originating from the left side of his chest and radiating to his left arm. He also reports associated symptoms of weakness and near syncope. The patient notes that these symptoms are similar to those he experienced in March, which led to a hospital visit and cardiac catheterization. The patient's symptoms have been causing him significant distress, leading to more bad days than good and increased time spent in bed due to weakness.  The patient also reports a recent change in his medication regimen. He was taken off Imdur  and metoprolol  and started on carvedilol . However, he reports feeling better initially after stopping Imdur  but then started feeling worse again. The patient also underwent cervical spine fusion surgery in January, after which he started experiencing these chest pain symptoms.    Cardiovascular History & Procedures: Cardiovascular Problems: HTN CAD s/p PCI  HFpEF s/p Cardiomems   Risk Factors: advanced age (older than 16 for men, 34 for women), diabetes mellitus, dyslipidemia, hypertension, male gender, and obesity (BMI >= 30 kg/m2)  Cath / PCI: [facility] 05/12/2023 Coronary physiology testing:  Guide catheter: 6 Fr EBU 3.0  Normalization performed in left  main coronary artery.  IC Nitroglycerin : 200 mcg  RFR: 0.96  FFR: 0.89  CFR: 3.4  IMR: 45  No significant pressure drift noted.  Coronary Findings Diagnostic Dominance: Right  Left Main: Vessel is normal in caliber. Vessel is angiographically normal.  Left Anterior Descending: Mid LAD-1 lesion is 40% stenosed. Pressure gradient was performed on the lesion using a GUIDEWIRE PRESSURE X 175 and CATH LAUNCHER 6FR EBU3.5. Previously placed Mid LAD-2 stent of unknown type is widely patent.  Ramus Intermedius: Vessel is small.  Left Circumflex: Vessel is normal in caliber. Vessel is angiographically normal. First Obtuse Marginal Branch: 1st Mrg lesion is 20% stenosed.  Right Coronary Artery: Vessel is normal in caliber. The vessel exhibits minimal luminal irregularities.   Intervention  No interventions have been documented.   Left Ventricle  LV end diastolic pressure is normal.   Antiplatelet/Anticoag  Recommend Aspirin  81mg  daily for moderate CAD.   RHC for Cardiomems --Hemodynamics (mm Hg): BP: 149/73  HR: 72 RA: 2 RV: 19/2 PA: 21/7 (13) Wedge: 5 PA saturation: 63% Fick cardiac output/index: 6.6 L/min; 2.6 L/min/m^2 Thermodilution cardiac output/index: Not Obtained Pulmonary vascular resistance: 1.2 Woods units     Findings: Normal right heart pressures Successful deployment of CardioMEMs   LHC 04/04/2017 No significant obstructive Coronary artery disease. Patent LAD stent.  Elevated left ventricular filling pressures (LVEDP = 26 mm Hg)  LHC 01/19/2023 1. Normal LV systolic function 2. Coronary artery disease including 70% mid-LAD stenosis with FFR = 0.75 3. Successful PCI of mid-LAD with placement of a Sierra drug eluting stent    Non-Invasive Evaluation(s):  TTE 05/12/2023 1. Left ventricular ejection fraction, by estimation, is 60 to 65%. Left ventricular ejection fraction by PLAX is 62 %. The left ventricle has normal function. The left ventricle has no regional  wall motion abnormalities. There is mild left ventricular  hypertrophy. Left ventricular diastolic parameters are consistent with Grade I diastolic dysfunction (impaired relaxation).   2. Right ventricular systolic function is normal. The right ventricular size is mildly enlarged. There is normal pulmonary artery systolic pressure. The estimated right ventricular systolic pressure is 11.3 mmHg.   3. The mitral valve is grossly normal. No evidence of mitral valve regurgitation.   4. The aortic valve is tricuspid. Aortic valve regurgitation is not visualized.   5. Aortic dilatation noted. There is borderline dilatation of the ascending aorta, measuring 39 mm.   6. The inferior vena cava is normal in size with greater than 50% respiratory variability, suggesting right atrial pressure of 3 mmHg.   Comparison(s): Changes from prior study are noted. 02/04/2019: LVEF 55-60%.   ECG: NSR, 1st degree AV block, RBBB . Zio patch 01/18/2022 Patient had a min HR of 40 bpm, max HR of 108 bpm, and avg HR of 72  bpm. Predominant underlying rhythm was Sinus Rhythm. First Degree AV  Block was present. Slight P wave morphology changes were noted. Bundle  Branch Block/IVCD was present. Isolated SVEs were rare (<1.0%, 297), and  no SVE Couplets or SVE Triplets were present. Isolated VEs were  occasional (2.7%, 34056), VE Couplets were rare (<1.0%, 120), and VE  Triplets were rare (<1.0%, 14). Ventricular Bigeminy and Trigeminy were  present.   There were 16 patient triggered events.  5 episodes corresponded to [facility], PVC or both.  The remaining 11 episodes all corresponded to normal sinus rhythm.   There is no significant sinus pauses or AV block noted.  Echocardiogram: 12/24/2022  1. The left ventricle is normal in size with normal wall thickness.   2. The left ventricular systolic function is normal, LVEF is visually estimated at > 55%.   3. The right ventricle is upper normal in size, with probably  normal systolic function.  PET 05/03/2022 - No evidence of any significant ischemia or scar despite degraded quality which could be due to technique. - Left ventricular systolic function is normal. Post stress the ejection fraction is > 60%. - Attenuation CT scan shows post PCI findings - Aortic valve calcifications are noted - Bilateral gynecomastia, increased since prior. Recommend clinical correlation. Consider further evaluation, as clinically indicated. - Incidentally noted on the attenuation CT scan are calcified nodules in the left lower lobe likely secondary to prior granulomatous disease - Status post upper abdominal surgery (likely gastric bypass surgery) and cholecystectomy. Splenomegaly. Cirrhotic liver morphology.    Past Medical History:  Diagnosis Date  . Basal cell carcinoma   . Bilateral hearing loss 11/17/2016   Last Assessment & Plan:  Has R hearing aide, but doesn't use due to recurrent external ear infections. Has seen ENT in the past. Cannot afford L hearing aide.   . Cataract   . Chronic diastolic (congestive) heart failure 06/21/2017  . Coronary artery disease   . CRVO (central retinal vein occlusion) 10/17/2013   Overview:  L eye 01/2017 Bulakowski - referred to retinologist 01/2017 CRVO with macular edema L eye planned treatment with Ozurdex  (dexamehthasone intravitreal implant) by Dr Jarold 02/2017  Last Assessment & Plan:  H/o this. Concern for recurrence or retinal detachment. Has upcoming appt for eval later this week.   . Diabetes mellitus   . Diastolic heart failure   . Dyspnea 01/17/2017  . GERD (gastroesophageal reflux disease)   . Hypertension   . Hypothyroid   . NASH (nonalcoholic steatohepatitis)   . Retinal hemorrhage of left eye 10/17/2013   Overview:  Last Assessment & Plan:  ophtho recommends carotid US  to eval ?ocular ischemic syndrome due to periph retinal hemes esp OU Will order  . Splenomegaly 05/07/2015  . Status post bariatric surgery 08/21/2013    Overview:  Overview:  Gastric stapling 1985 Overview:  Gastric stapling 1985  . Thrombocytopenia 06/10/2015   Overview:  Overview:  Platelet count dropped to 73 post op day 2 after total knee  Last Assessment & Plan:  Counts have stabillized    Past Surgical History:  Procedure Laterality Date  . bariatric surgery    . CARDIAC SURGERY    . CATARACT EXTRACTION    . CHOLECYSTECTOMY    . PR CATH PLACE/CORON ANGIO, IMG SUPER/INTERP,W LEFT HEART VENTRICULOGRAPHY N/A 01/18/2017   Procedure: Left Heart Catheterization W Intervention;  Surgeon: Zachary Prentice Car, MD;  Location: [facility] CATH;  Service: Cardiology  . PR CATH PLACE/CORON ANGIO, IMG SUPER/INTERP,W LEFT HEART VENTRICULOGRAPHY N/A 04/04/2017   Procedure: Left Heart Catheterization;  Surgeon: Norleen Deward Grumbling, MD;  Location: [facility] CATH;  Service: Cardiology  . PR IMPL PRESSURE SENSOR MENA N/A 05/24/2017   Procedure: IMPL PRESSURE SENSOR WITH ANGIOGRAPHY;  Surgeon: Zachary Prentice Car, MD;  Location: [facility] CATH;  Service: Cardiology  . REPLACEMENT TOTAL KNEE    . rotator cuff surgery    . SKIN  BIOPSY    . TONSILLECTOMY      Biguanides  Current Outpatient Medications  Medication Sig Dispense Refill  . acetaminophen  (TYLENOL ) 500 MG tablet Take 2 tablets (1,000 mg total) by mouth every eight (8) hours as needed.    . albuterol  (PROVENTIL  HFA;VENTOLIN  HFA) 90 mcg/actuation inhaler Inhale 2 puffs every six (6) hours as needed for wheezing.    . allopurinol  (ZYLOPRIM ) 100 MG tablet Take 1 tablet (100 mg total) by mouth.    . amlodipine  (NORVASC ) 2.5 MG tablet Take 1 tablet (2.5 mg total) by mouth daily. TAKE 1 TABLET (2.5 MG TOTAL) BY MOUTH DAILY.    . aspirin  (ECOTRIN) 81 MG tablet Take 1 tablet (81 mg total) by mouth daily.    . atorvastatin  (LIPITOR) 20 MG tablet TAKE 1 TABLET BY MOUTH ONCE DAILY 90 tablet 1  . bumetanide  (BUMEX ) 1 MG tablet Take 3 tablets (3 mg total) by mouth daily. (Patient taking differently: Take 3 tablets  (3 mg total) by mouth daily. 2 mg every morning and 1 mg every evening) 270 tablet 2  . calcium  carbonate-vitamin D3 600 mg-10 mcg (400 unit) per tablet Take 1 tablet by mouth Two (2) times a day with meals.    . citalopram  (CELEXA ) 20 MG tablet Take 1 tablet (20 mg total) by mouth daily.    . colchicine  (COLCRYS ) 0.6 mg tablet TAKE ONE TABLET (0.6 MG TOTAL) BY MOUTH DAILY AS NEEDED (GOUT FLARE). MAY TAKE TWO TABLETS ON FIRST DAY OF GOUT FLARE    . cyanocobalamin  500 MCG tablet Take 1 tablet (500 mcg total) by mouth.    . dexlansoprazole  60 mg capsule Take 1 capsule (60 mg total) by mouth daily.    . FARXIGA  10 mg Tab tablet TAKE ONE TABLET BY MOUTH EVERY MORNING 30 tablet 11  . ferrous sulfate  325 (65 FE) MG EC tablet Take 1 tablet (325 mg total) by mouth 3 (three) times a week.    . fluticasone  propionate (FLONASE ) 50 mcg/actuation nasal spray 1 spray into each nostril daily as needed. prn    . levothyroxine  (SYNTHROID , LEVOTHROID) 125 MCG tablet Take 1 tablet (125 mcg total) by mouth daily. 30 tablet 1  . magnesium  oxide (MAG-OX) 400 mg (241.3 mg elemental magnesium ) tablet Take 1 tablet (400 mg total) by mouth daily. 60 tablet 11  . metoclopramide  (REGLAN ) 5 MG tablet TAKE 1 TABLET BY MOUTH 3 TIMES DAILY BEFORE MEALS    . multivitamin (TAB-A-VITE/THERAGRAN) per tablet Take 1 tablet by mouth daily.    . nitroglycerin  (NITROSTAT ) 0.4 MG SL tablet Place 1 tablet (0.4 mg total) under the tongue every five (5) minutes as needed for chest pain. 25 tablet 11  . OZEMPIC  0.25 mg or 0.5 mg(2 mg/1.5 mL) PnIj Inject 0.5 mg under the skin every seven (7) days.    . potassium chloride  (KLOR-CON ) 10 MEQ CR tablet Take 1 tablet (10 mEq total) by mouth in the morning.    . spironolactone  (ALDACTONE ) 25 MG tablet TAKE ONE TABLET BY MOUTH ONCE A DAY 30 tablet 7  . vitamin E  400 UNIT capsule Take 1 capsule (268 mg total) by mouth daily.    . carvedilol  (COREG ) 3.125 MG tablet Take 2 tablets (6.25 mg total) by  mouth two (2) times a day. 360 tablet 3  . diclofenac  sodium (VOLTAREN ) 1 % gel Apply 2 g topically. (Patient not taking: Reported on 06/09/2023)    . isosorbide  mononitrate (IMDUR ) 60 MG 24 hr tablet Take 1  tablet (60 mg total) by mouth daily. 90 tablet 3  . metOLazone  (ZAROXOLYN ) 2.5 MG tablet TAKE AS NEEDED UP TO 3 TIMES WEEKLY WHENINSTRUCTED BY CARDIOLOGY CLINIC. (Patient not taking: Reported on 06/09/2023) 12 tablet 0  . pen needle, diabetic 31 gauge x 1/4 (6 mm) Ndle USE AS DIRECTED FOR LANTUS  SOLOSTAR PEN    . pen needle, diabetic 31 gauge x 1/4 (6 mm) Ndle Need follow up scheduled in the next 1 to 2 months USE AS DIRECTED FOR LANTUS  SOLOSTAR PEN     No current facility-administered medications for this visit.   Social History: Social History   Tobacco Use  . Smoking status: Never  . Smokeless tobacco: Never  Substance Use Topics  . Alcohol use: No  He reports no history of drug use.  Family History: His family history includes Coronary artery disease in his brother; Diabetes in his brother; Hypertension in his brother; Lung cancer in his father.  Review of Systems: Review of ten systems is negative or unremarkable except as stated above.  Physical Exam: VITAL SIGNS:  Vitals:   06/09/23 1318  BP: 114/63  Pulse: 72  Resp: 20  Temp: 36.3 C (97.3 F)  SpO2: 99%  Weight: (!) 115 kg (253 lb 8 oz)  Height: 175.3 cm (5' 9)    General: Well appearing, non toxic, no acute distress Resp: CTA bilaterally without wheezes or crackles.  Normal WOB. CV: RRR without m/r/g. No JVD  Abd: NABS.  Soft, NT/ND. Ext: peripheral pulses intact, warm well perfused, no LE edema.   Pertinent Laboratory Studies:  Lab Results  Component Value Date   PRO-BNP 64.0 12/23/2021   PRO-BNP 47.9 10/23/2018   PRO-BNP 39.4 08/07/2018   Creatinine/FP 1.08 08/04/2010   Creatinine, POCT 1.0 08/28/2017   Creatinine 0.88 05/08/2023   BUN/FP 19 08/04/2010   BUN 17 05/08/2023   BUN, POCT 14  08/28/2017   Potassium/FP 4.4 08/04/2010   Potassium 3.7 05/08/2023   Potassium, POCT 3.8 08/28/2017   Magnesium  2.2 12/24/2021   WBC 10.0 05/08/2023   HGB 17.5 (H) 05/08/2023   Hemoglobin, POC 11.8 (L) 05/24/2017   HCT 51.5 (H) 05/08/2023   Platelet 186 05/08/2023   INR 1.04 05/08/2023   INR 1.08 12/22/2021     Toni Cool, MD Assistant Professor of Medicine Division of Cardiology

## 2023-06-26 ENCOUNTER — Encounter: Payer: Self-pay | Admitting: Internal Medicine

## 2023-06-26 ENCOUNTER — Ambulatory Visit (INDEPENDENT_AMBULATORY_CARE_PROVIDER_SITE_OTHER): Admitting: Internal Medicine

## 2023-06-26 VITALS — BP 110/64 | HR 72 | Temp 98.3°F | Ht 69.0 in | Wt 257.0 lb

## 2023-06-26 DIAGNOSIS — J069 Acute upper respiratory infection, unspecified: Secondary | ICD-10-CM | POA: Diagnosis not present

## 2023-06-26 LAB — POC COVID19 BINAXNOW: SARS Coronavirus 2 Ag: NEGATIVE

## 2023-06-26 NOTE — Addendum Note (Signed)
 Addended by: Franne Ivory on: 06/26/2023 02:26 PM   Modules accepted: Orders
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
## 2023-06-26 NOTE — Assessment & Plan Note (Signed)
 COVID negative Discussed symptom relief--tylenol  and OTC cough meds If worsens later in the week, would try empiric antibiotic

## 2023-06-27 ENCOUNTER — Other Ambulatory Visit: Payer: Self-pay | Admitting: Family Medicine

## 2023-06-27 NOTE — Telephone Encounter (Signed)
 Potassium Last filled:  03/24/23, #180 Last OV:  05/15/23, HFU & 3 mo f/u Next OV:  08/15/23, CPE

## 2023-07-18 ENCOUNTER — Other Ambulatory Visit: Payer: Self-pay | Admitting: Nurse Practitioner

## 2023-07-25 ENCOUNTER — Other Ambulatory Visit: Payer: Self-pay | Admitting: Internal Medicine

## 2023-08-01 ENCOUNTER — Other Ambulatory Visit: Payer: Self-pay | Admitting: Family Medicine

## 2023-08-01 ENCOUNTER — Other Ambulatory Visit (HOSPITAL_COMMUNITY): Payer: Self-pay

## 2023-08-01 DIAGNOSIS — F418 Other specified anxiety disorders: Secondary | ICD-10-CM

## 2023-08-07 ENCOUNTER — Other Ambulatory Visit: Payer: Self-pay | Admitting: Family Medicine

## 2023-08-07 ENCOUNTER — Encounter: Payer: Self-pay | Admitting: Family Medicine

## 2023-08-07 DIAGNOSIS — K7460 Unspecified cirrhosis of liver: Secondary | ICD-10-CM

## 2023-08-07 DIAGNOSIS — E039 Hypothyroidism, unspecified: Secondary | ICD-10-CM

## 2023-08-07 DIAGNOSIS — M1A9XX0 Chronic gout, unspecified, without tophus (tophi): Secondary | ICD-10-CM

## 2023-08-07 DIAGNOSIS — D696 Thrombocytopenia, unspecified: Secondary | ICD-10-CM

## 2023-08-07 DIAGNOSIS — E1169 Type 2 diabetes mellitus with other specified complication: Secondary | ICD-10-CM

## 2023-08-07 DIAGNOSIS — Z9884 Bariatric surgery status: Secondary | ICD-10-CM

## 2023-08-07 DIAGNOSIS — E611 Iron deficiency: Secondary | ICD-10-CM

## 2023-08-08 ENCOUNTER — Other Ambulatory Visit (INDEPENDENT_AMBULATORY_CARE_PROVIDER_SITE_OTHER)

## 2023-08-08 DIAGNOSIS — K7581 Nonalcoholic steatohepatitis (NASH): Secondary | ICD-10-CM | POA: Diagnosis not present

## 2023-08-08 DIAGNOSIS — M1A9XX0 Chronic gout, unspecified, without tophus (tophi): Secondary | ICD-10-CM | POA: Diagnosis not present

## 2023-08-08 DIAGNOSIS — E785 Hyperlipidemia, unspecified: Secondary | ICD-10-CM | POA: Diagnosis not present

## 2023-08-08 DIAGNOSIS — K7460 Unspecified cirrhosis of liver: Secondary | ICD-10-CM | POA: Diagnosis not present

## 2023-08-08 DIAGNOSIS — E1169 Type 2 diabetes mellitus with other specified complication: Secondary | ICD-10-CM

## 2023-08-08 DIAGNOSIS — E611 Iron deficiency: Secondary | ICD-10-CM

## 2023-08-08 DIAGNOSIS — Z9884 Bariatric surgery status: Secondary | ICD-10-CM | POA: Diagnosis not present

## 2023-08-08 DIAGNOSIS — D696 Thrombocytopenia, unspecified: Secondary | ICD-10-CM | POA: Diagnosis not present

## 2023-08-08 DIAGNOSIS — E039 Hypothyroidism, unspecified: Secondary | ICD-10-CM

## 2023-08-08 LAB — FERRITIN: Ferritin: 169.5 ng/mL (ref 22.0–322.0)

## 2023-08-08 LAB — IBC PANEL
Iron: 130 ug/dL (ref 42–165)
Saturation Ratios: 42.2 % (ref 20.0–50.0)
TIBC: 308 ug/dL (ref 250.0–450.0)
Transferrin: 220 mg/dL (ref 212.0–360.0)

## 2023-08-08 LAB — CBC WITH DIFFERENTIAL/PLATELET
Basophils Absolute: 0 10*3/uL (ref 0.0–0.1)
Basophils Relative: 0.5 % (ref 0.0–3.0)
Eosinophils Absolute: 0.1 10*3/uL (ref 0.0–0.7)
Eosinophils Relative: 1.9 % (ref 0.0–5.0)
HCT: 47.2 % (ref 39.0–52.0)
Hemoglobin: 15.9 g/dL (ref 13.0–17.0)
Lymphocytes Relative: 13.7 % (ref 12.0–46.0)
Lymphs Abs: 0.7 10*3/uL (ref 0.7–4.0)
MCHC: 33.6 g/dL (ref 30.0–36.0)
MCV: 88.5 fl (ref 78.0–100.0)
Monocytes Absolute: 0.4 10*3/uL (ref 0.1–1.0)
Monocytes Relative: 8.7 % (ref 3.0–12.0)
Neutro Abs: 3.7 10*3/uL (ref 1.4–7.7)
Neutrophils Relative %: 75.2 % (ref 43.0–77.0)
Platelets: 131 10*3/uL — ABNORMAL LOW (ref 150.0–400.0)
RBC: 5.34 Mil/uL (ref 4.22–5.81)
RDW: 13.1 % (ref 11.5–15.5)
WBC: 4.9 10*3/uL (ref 4.0–10.5)

## 2023-08-08 LAB — COMPREHENSIVE METABOLIC PANEL WITH GFR
ALT: 54 U/L — ABNORMAL HIGH (ref 0–53)
AST: 56 U/L — ABNORMAL HIGH (ref 0–37)
Albumin: 4.5 g/dL (ref 3.5–5.2)
Alkaline Phosphatase: 168 U/L — ABNORMAL HIGH (ref 39–117)
BUN: 17 mg/dL (ref 6–23)
CO2: 29 meq/L (ref 19–32)
Calcium: 9.2 mg/dL (ref 8.4–10.5)
Chloride: 97 meq/L (ref 96–112)
Creatinine, Ser: 1.07 mg/dL (ref 0.40–1.50)
GFR: 71.33 mL/min (ref >60.00)
Glucose, Bld: 194 mg/dL — ABNORMAL HIGH (ref 70–99)
Potassium: 3.7 meq/L (ref 3.5–5.1)
Sodium: 136 meq/L (ref 135–145)
Total Bilirubin: 1.8 mg/dL — ABNORMAL HIGH (ref 0.2–1.2)
Total Protein: 6.6 g/dL (ref 6.0–8.3)

## 2023-08-08 LAB — FOLATE: Folate: 21.2 ng/mL (ref >5.9)

## 2023-08-08 LAB — HEMOGLOBIN A1C: Hgb A1c MFr Bld: 6.7 % — ABNORMAL HIGH (ref 4.6–6.5)

## 2023-08-08 LAB — LIPID PANEL
Cholesterol: 104 mg/dL (ref 0–200)
HDL: 37.2 mg/dL — ABNORMAL LOW (ref >39.00)
LDL Cholesterol: 31 mg/dL (ref 0–99)
NonHDL: 66.71
Total CHOL/HDL Ratio: 3
Triglycerides: 178 mg/dL — ABNORMAL HIGH (ref 0.0–149.0)
VLDL: 35.6 mg/dL (ref 0.0–40.0)

## 2023-08-08 LAB — MICROALBUMIN / CREATININE URINE RATIO
Creatinine,U: 27.2 mg/dL
Microalb Creat Ratio: UNDETERMINED mg/g (ref 0.0–30.0)
Microalb, Ur: <0.7 mg/dL

## 2023-08-08 LAB — TSH: TSH: 5.62 u[IU]/mL — ABNORMAL HIGH (ref 0.35–5.50)

## 2023-08-08 LAB — VITAMIN D 25 HYDROXY (VIT D DEFICIENCY, FRACTURES): VITD: 34.8 ng/mL (ref 30.00–100.00)

## 2023-08-08 LAB — VITAMIN B12: Vitamin B-12: 633 pg/mL (ref 211–911)

## 2023-08-08 LAB — URIC ACID: Uric Acid, Serum: 7.6 mg/dL (ref 4.0–7.8)

## 2023-08-09 ENCOUNTER — Ambulatory Visit: Payer: Self-pay | Admitting: Family Medicine

## 2023-08-09 ENCOUNTER — Other Ambulatory Visit: Payer: Self-pay | Admitting: Family Medicine

## 2023-08-09 DIAGNOSIS — K219 Gastro-esophageal reflux disease without esophagitis: Secondary | ICD-10-CM

## 2023-08-09 DIAGNOSIS — E1169 Type 2 diabetes mellitus with other specified complication: Secondary | ICD-10-CM

## 2023-08-11 LAB — VITAMIN B1: Vitamin B1 (Thiamine): 10 nmol/L (ref 8–30)

## 2023-08-15 ENCOUNTER — Other Ambulatory Visit: Payer: Self-pay | Admitting: Family Medicine

## 2023-08-15 ENCOUNTER — Encounter: Payer: Self-pay | Admitting: Family Medicine

## 2023-08-15 ENCOUNTER — Ambulatory Visit: Admitting: Family Medicine

## 2023-08-15 ENCOUNTER — Other Ambulatory Visit: Payer: Self-pay | Admitting: Internal Medicine

## 2023-08-15 VITALS — BP 106/62 | HR 67 | Temp 97.9°F | Ht 69.5 in | Wt 261.0 lb

## 2023-08-15 DIAGNOSIS — E113559 Type 2 diabetes mellitus with stable proliferative diabetic retinopathy, unspecified eye: Secondary | ICD-10-CM

## 2023-08-15 DIAGNOSIS — Z9884 Bariatric surgery status: Secondary | ICD-10-CM

## 2023-08-15 DIAGNOSIS — Z7985 Long-term (current) use of injectable non-insulin antidiabetic drugs: Secondary | ICD-10-CM | POA: Diagnosis not present

## 2023-08-15 DIAGNOSIS — K7581 Nonalcoholic steatohepatitis (NASH): Secondary | ICD-10-CM | POA: Diagnosis not present

## 2023-08-15 DIAGNOSIS — R001 Bradycardia, unspecified: Secondary | ICD-10-CM

## 2023-08-15 DIAGNOSIS — K7460 Unspecified cirrhosis of liver: Secondary | ICD-10-CM

## 2023-08-15 DIAGNOSIS — F418 Other specified anxiety disorders: Secondary | ICD-10-CM

## 2023-08-15 DIAGNOSIS — D696 Thrombocytopenia, unspecified: Secondary | ICD-10-CM

## 2023-08-15 DIAGNOSIS — I5032 Chronic diastolic (congestive) heart failure: Secondary | ICD-10-CM | POA: Diagnosis not present

## 2023-08-15 DIAGNOSIS — Z789 Other specified health status: Secondary | ICD-10-CM

## 2023-08-15 DIAGNOSIS — E785 Hyperlipidemia, unspecified: Secondary | ICD-10-CM

## 2023-08-15 DIAGNOSIS — E1169 Type 2 diabetes mellitus with other specified complication: Secondary | ICD-10-CM

## 2023-08-15 DIAGNOSIS — K3184 Gastroparesis: Secondary | ICD-10-CM

## 2023-08-15 DIAGNOSIS — M1A00X0 Idiopathic chronic gout, unspecified site, without tophus (tophi): Secondary | ICD-10-CM | POA: Diagnosis not present

## 2023-08-15 DIAGNOSIS — K219 Gastro-esophageal reflux disease without esophagitis: Secondary | ICD-10-CM

## 2023-08-15 DIAGNOSIS — E1143 Type 2 diabetes mellitus with diabetic autonomic (poly)neuropathy: Secondary | ICD-10-CM

## 2023-08-15 DIAGNOSIS — J4520 Mild intermittent asthma, uncomplicated: Secondary | ICD-10-CM

## 2023-08-15 DIAGNOSIS — E1142 Type 2 diabetes mellitus with diabetic polyneuropathy: Secondary | ICD-10-CM | POA: Diagnosis not present

## 2023-08-15 DIAGNOSIS — Z7189 Other specified counseling: Secondary | ICD-10-CM | POA: Diagnosis not present

## 2023-08-15 DIAGNOSIS — I10 Essential (primary) hypertension: Secondary | ICD-10-CM | POA: Diagnosis not present

## 2023-08-15 DIAGNOSIS — E611 Iron deficiency: Secondary | ICD-10-CM

## 2023-08-15 DIAGNOSIS — E039 Hypothyroidism, unspecified: Secondary | ICD-10-CM

## 2023-08-15 MED ORDER — DEXLANSOPRAZOLE 60 MG PO CPDR: 60.0000 mg | DELAYED_RELEASE_CAPSULE | Freq: Every day | ORAL | 3 refills | Status: DC

## 2023-08-15 MED ORDER — ALLOPURINOL 100 MG PO TABS: 100.0000 mg | ORAL_TABLET | ORAL | 3 refills | Status: AC

## 2023-08-15 MED ORDER — LANCET DEVICE MISC: 1.0000 | Freq: Three times a day (TID) | 0 refills | Status: AC

## 2023-08-15 MED ORDER — ATORVASTATIN CALCIUM 20 MG PO TABS: 20.0000 mg | ORAL_TABLET | Freq: Every day | ORAL | 3 refills | Status: AC

## 2023-08-15 MED ORDER — CITALOPRAM HYDROBROMIDE 20 MG PO TABS
20.0000 mg | ORAL_TABLET | Freq: Every day | ORAL | 3 refills | Status: DC
Start: 2023-08-15 — End: 2023-12-19

## 2023-08-15 MED ORDER — COLCHICINE 0.6 MG PO TABS: 0.6000 mg | ORAL_TABLET | Freq: Every day | ORAL | 3 refills | Status: DC | PRN

## 2023-08-15 MED ORDER — SEMAGLUTIDE (2 MG/DOSE) 8 MG/3ML ~~LOC~~ SOPN: 2.0000 mg | PEN_INJECTOR | SUBCUTANEOUS | 3 refills | Status: DC

## 2023-08-15 MED ORDER — LANCETS MISC. MISC: 1.0000 | Freq: Three times a day (TID) | 0 refills | Status: AC

## 2023-08-15 MED ORDER — FAMOTIDINE 20 MG PO TABS
20.0000 mg | ORAL_TABLET | Freq: Every day | ORAL | 3 refills | Status: DC
Start: 2023-08-15 — End: 2023-08-23

## 2023-08-15 MED ORDER — LEVOTHYROXINE SODIUM 137 MCG PO TABS: 137.0000 ug | ORAL_TABLET | Freq: Every day | ORAL | 3 refills | Status: AC

## 2023-08-15 MED ORDER — BLOOD GLUCOSE MONITORING SUPPL DEVI: 1.0000 | Freq: Three times a day (TID) | 0 refills | Status: DC

## 2023-08-15 MED ORDER — AMLODIPINE BESYLATE 2.5 MG PO TABS
2.5000 mg | ORAL_TABLET | Freq: Every day | ORAL | 3 refills | Status: DC
Start: 2023-08-15 — End: 2023-12-12

## 2023-08-15 MED ORDER — FLUTICASONE PROPIONATE 50 MCG/ACT NA SUSP
2.0000 | Freq: Every day | NASAL | 12 refills | Status: AC
Start: 2023-08-15 — End: ?

## 2023-08-15 MED ORDER — BLOOD GLUCOSE TEST VI STRP: 1.0000 | ORAL_STRIP | Freq: Three times a day (TID) | 0 refills | Status: AC

## 2023-08-15 NOTE — Assessment & Plan Note (Signed)
 Chronic, urate elevated but continue current regimen given no recent gout flares. He states he's taking allopurinol  100mg  every other day. Med list updated.

## 2023-08-15 NOTE — Assessment & Plan Note (Signed)
 Recommend he schedule diabetic eye exam.

## 2023-08-15 NOTE — Assessment & Plan Note (Signed)
 Was taking daily oral iron replacement - recommend dropping to every other day dosing as recent levels stable.

## 2023-08-15 NOTE — Assessment & Plan Note (Signed)
 Chronic, stable. Presumed due to liver disease.

## 2023-08-15 NOTE — Assessment & Plan Note (Addendum)
 Chronic, stable on current regimen - continue this. New glucometer sent to pharmacy.

## 2023-08-15 NOTE — Assessment & Plan Note (Addendum)
 Chronic, stable on atorvastatin  20mg  daily - continue this. LDL, nonHDL at goal H/o elevated Lp(a).  The ASCVD Risk score (Arnett DK, et al., 2019) failed to calculate for the following reasons:   Risk score cannot be calculated because patient has a medical history suggesting prior/existing ASCVD

## 2023-08-15 NOTE — Assessment & Plan Note (Signed)
 Chronic. TSH actually elevated - and endorses hypothyroid symptoms. Will increase levothyroxine  to 137mcg daily, rec recheck thyroid  function in 6 wks.

## 2023-08-15 NOTE — Progress Notes (Signed)
 Ph: [phone] Fax: [phone]   Patient ID: Chad [name], male    DOB: 03-Sep-1955, 68 y.o.   MRN: [id number]  This visit was conducted in person.  BP 106/62 (Patient Position: Standing)   Pulse 67   Temp 97.9 F (36.6 C) (Oral)   Ht 5' 9.5 (1.765 m)   Wt 261 lb (118.4 kg)   SpO2 97%   BMI 37.99 kg/m   Orthostatic Vitals for the past 48 hrs (Last 6 readings):  Patient Position Orthostatic BP BP Pulse  08/15/23 0949 -- -- 128/66 67  08/15/23 1143 Supine 120/62 -- --  08/15/23 1146 Standing -- 106/62 --   BP Readings from Last 3 Encounters:  08/15/23 106/62  06/26/23 110/64  05/25/23 126/80   CC: CPE Subjective:   HPI: AZAREL BANNER is a 68 y.o. male presenting on 08/15/2023 for Annual Exam (MCR prt 2 [AWV- 05/25/23]. Pt accompanied by wife, Nathanel. )   Saw health advisor 05/2023 for medicare wellness visit. Note reviewed.  Failed cognitive assessment.     05/25/2023    2:40 PM 03/14/2022    9:00 AM  6CIT Screen  What Year? 0 points 0 points  What month? 0 points 0 points  What time? 0 points 0 points  Count back from 20 0 points 0 points  Months in reverse 4 points 0 points  Repeat phrase 6 points 0 points  Total Score 10 points 0 points  He notes he wasn't feeling well that day which can sometimes affect his memory.   No results found.  Flowsheet Row Office Visit from 08/15/2023 in [facility] [place]  PHQ-2 Total Score 0       08/15/2023   10:07 AM 06/26/2023   11:46 AM 05/25/2023    2:44 PM 03/06/2023   11:07 AM 02/13/2023    9:25 AM  Fall Risk   Falls in the past year? 0 0 0 1 1  Number falls in past yr:  0 0 0 0  Injury with Fall?  0 0 0 0  Risk for fall due to :  No Fall Risks No Fall Risks    Follow up  Falls evaluation completed Falls prevention discussed;Falls evaluation completed      DM - regularly on farxiga 10mg  daily, ozempic  2mg  weekly. Weight gain noted despite this. Overdue for diabetic eye exam. Hasn't been  checking sugars - lost his Accuechek meter. New glucometer Rx sent to pharmacy today  S/p C5/6 ACDF on 03/16/2023 (Dr Bluford)   [facility] cardiology Dr Bethel 05/2023 restarted isosorbide  60mg  daily, carvedilol  increased to 6.25mg  bid, upcoming appt 08/2023 with Dr Calton Helena to establish care (also at [facility] cardiology clinic).   Preventative: COLONOSCOPY Date: 12/2014 TAs, mod diverticulosis, rpt 3 yrs (Pyrtle) COLONOSCOPY 08/2015 - HP, diverticulosis (Pyrtle) COLONOSCOPY WITH PROPOFOL  02/17/2020 - inflammatory polyp, rpt 5 yrs (Pyrtle, Gordy HERO, MD) ESOPHAGOGASTRODUODENOSCOPY (EGD) WITH PROPOFOL  02/17/2020 - chronic gastritis, neg H pylori (Pyrtle, Gordy HERO, MD) Prostate cancer screen yearly  Lung cancer screening - not eligible Flu shot yearly  COVID vaccine Pfizer 03/2019, 04/2019, booster 01/2020 Td - 01/2011  Pneumovax 2008, 2013  Prevnar-13 02/2020 Shingrix - 11/2018, 06/2019 Previously discussed advanced directives, doesn't think would want prolonged life support. Would want wife to be HCPOA. Packet previously provided. Would want ventilation support only if he gets coronavirus infection.  Seat belt use discussed Sunscreen use discussed, sees dermatology [facility] Dr Hester regularly.  Non smoker Alcohol - none Dentist - yearly Eye exam yearly - 2021. L vision loss due to CRVO. [place] [facility] Bowel - no constipation  Bladder - no incontinence    Caffeine: 2 cups coffee Lives with wife, 2 dogs Occupation: Retired, used to Visual merchandiser rock, on disability for stomach and pain and severe GERD Activity: 1 mile a few times a week Diet: lots of water, good fruits/vegetables. Avoiding fried foods.     Relevant past medical, surgical, family and social history reviewed and updated as indicated. Interim medical history since our last visit reviewed. Allergies and medications reviewed and updated. Outpatient Medications Prior to Visit  Medication Sig Dispense Refill    aspirin  81 MG chewable tablet Chew 81 mg by mouth daily.     benzonatate  (TESSALON ) 200 MG capsule Take 1 capsule (200 mg total) by mouth 2 (two) times daily as needed for cough. 20 capsule 0   bumetanide  (BUMEX ) 1 MG tablet Take 2 tablets (2 mg total) by mouth daily AND 1 tablet (1 mg total) at bedtime.     dapagliflozin propanediol (FARXIGA) 10 MG TABS tablet Take 10 mg by mouth daily before breakfast.     ferrous sulfate  325 (65 FE) MG EC tablet Take 1 tablet (325 mg total) by mouth every other day.     isosorbide  mononitrate (IMDUR ) 60 MG 24 hr tablet Take 1 tablet (60 mg total) by mouth daily.     Magnesium  400 MG TABS Take 400 mg by mouth 2 (two) times daily.     metoCLOPramide  (REGLAN ) 5 MG tablet Take 1 tablet (5 mg total) by mouth every 6 (six) hours as needed for nausea. Please schedule a follow up appointment for further refills. Thank you 90 tablet 0   metolazone (ZAROXOLYN) 2.5 MG tablet Take 1 tablet (2.5 mg total) by mouth once a week. As needed for fluid/swelling     Multiple Vitamin (MULITIVITAMIN WITH MINERALS) TABS Take 1 tablet by mouth daily.     nitroGLYCERIN  (NITROSTAT ) 0.4 MG SL tablet Place 1 tablet (0.4 mg total) under the tongue every 5 (five) minutes as needed for chest pain. 60 tablet 3   ondansetron  (ZOFRAN -ODT) 4 MG disintegrating tablet Take 4 mg by mouth every 6 (six) hours as needed.     potassium chloride  (KLOR-CON ) 10 MEQ tablet TAKE ONE TABLET BY MOUTH TWICE A DAY 180 tablet 3   spironolactone  (ALDACTONE ) 25 MG tablet Take 25 mg by mouth daily.     VENTOLIN  HFA 108 (90 Base) MCG/ACT inhaler INHALE 2 PUFFS INTO THE LUNGS EVERY 6 HOURS AS NEEDED FOR WHEEZING OR SHORTNESS OF BREATH 18 g 5   vitamin B-12 (CYANOCOBALAMIN ) 500 MCG tablet Take 1 tablet (500 mcg total) by mouth daily.     vitamin E  400 UNIT capsule Take 400 Units by mouth daily.     allopurinol  (ZYLOPRIM ) 100 MG tablet TAKE ONE TABLET (100 MG TOTAL) BY MOUTH DAILY. INCREASE AS DIRECTED 90 tablet 3    amLODipine  (NORVASC ) 2.5 MG tablet Take 1 tablet (2.5 mg total) by mouth daily. 90 tablet 1   atorvastatin  (LIPITOR) 20 MG tablet TAKE ONE TABLET (20 MG TOTAL) BY MOUTH DAILY. 90 tablet 0   carvedilol  (COREG ) 3.125 MG tablet TAKE ONE TABLET (3.125 MG TOTAL) BY MOUTH TWO (TWO) TIMES DAILY WITH A MEAL. (STOP METOPROLOL ) 60 tablet 5   citalopram  (CELEXA ) 20 MG tablet TAKE ONE TABLET (20 MG TOTAL) BY MOUTH DAILY. 90 tablet 0   colchicine  0.6 MG  tablet TAKE ONE TABLET (0.6 MG TOTAL) BY MOUTH DAILY AS NEEDED (GOUT FLARE). MAY TAKE TWO TABLETS ON FIRST DAY OF GOUT FLARE 60 tablet 0   dexlansoprazole  (DEXILANT ) 60 MG capsule TAKE ONE CAPSULE (60 MG TOTAL) BY MOUTH DAILY. 90 capsule 0   famotidine  (PEPCID ) 20 MG tablet Take 1 tablet (20 mg total) by mouth at bedtime. 90 tablet 4   fluticasone  (FLONASE ) 50 MCG/ACT nasal spray Place 2 sprays into both nostrils daily. 16 g 6   levothyroxine  (SYNTHROID ) 125 MCG tablet TAKE ONE TAB BY MOUTH ONCE DAILY. TAKE ON AN EMPTY STOMACH WITH A GLASS OF WATER ATLEAST 30-60 MINUTES BEFORE BREAKFAST 90 tablet 4   Semaglutide , 2 MG/DOSE, 8 MG/3ML SOPN Inject 2 mg as directed once a week. 9 mL 1   carvedilol  (COREG ) 6.25 MG tablet Take 0.5 tablets (3.125 mg total) by mouth 2 (two) times daily with a meal.     carvedilol  (COREG ) 6.25 MG tablet Take 1 tablet (6.25 mg total) by mouth 2 (two) times daily with a meal.     No facility-administered medications prior to visit.     Per HPI unless specifically indicated in ROS section below Review of Systems  Constitutional:  Negative for activity change, appetite change, chills, fatigue, fever and unexpected weight change.  HENT:  Negative for hearing loss.   Eyes:  Negative for visual disturbance.  Respiratory:  Positive for chest tightness and shortness of breath. Negative for cough and wheezing.   Cardiovascular:  Positive for chest pain. Negative for palpitations and leg swelling.  Gastrointestinal:  Positive for abdominal  pain (related to constipation) and constipation. Negative for abdominal distention, blood in stool, diarrhea, nausea and vomiting.  Genitourinary:  Negative for difficulty urinating and hematuria.  Musculoskeletal:  Negative for arthralgias, myalgias and neck pain.  Skin:  Negative for rash.  Neurological:  Positive for dizziness (noted bending over) and headaches. Negative for seizures and syncope.  Hematological:  Negative for adenopathy. Bruises/bleeds easily.  Psychiatric/Behavioral:  Negative for dysphoric mood. The patient is not nervous/anxious.     Objective:  BP 106/62 (Patient Position: Standing)   Pulse 67   Temp 97.9 F (36.6 C) (Oral)   Ht 5' 9.5 (1.765 m)   Wt 261 lb (118.4 kg)   SpO2 97%   BMI 37.99 kg/m   Wt Readings from Last 3 Encounters:  08/15/23 261 lb (118.4 kg)  06/26/23 257 lb (116.6 kg)  05/25/23 244 lb (110.7 kg)      Physical Exam Vitals and nursing note reviewed.  Constitutional:      General: He is not in acute distress.    Appearance: Normal appearance. He is well-developed. He is not ill-appearing.  HENT:     Head: Normocephalic and atraumatic.     Right Ear: Hearing, tympanic membrane, ear canal and external ear normal.     Left Ear: Hearing, tympanic membrane, ear canal and external ear normal.   Eyes:     General: No scleral icterus.    Extraocular Movements: Extraocular movements intact.     Conjunctiva/sclera: Conjunctivae normal.     Pupils: Pupils are equal, round, and reactive to light.   Neck:     Thyroid : No thyroid  mass or thyromegaly.   Cardiovascular:     Rate and Rhythm: Regular rhythm. Bradycardia present.     Pulses: Normal pulses.          Radial pulses are 2+ on the right side and 2+ on the left side.  Heart sounds: Normal heart sounds. No murmur heard. Pulmonary:     Effort: Pulmonary effort is normal. No respiratory distress.     Breath sounds: Normal breath sounds. No wheezing, rhonchi or rales.  Abdominal:      General: Bowel sounds are normal. There is no distension.     Palpations: Abdomen is soft. There is no mass.     Tenderness: There is no abdominal tenderness. There is no guarding or rebound.     Hernia: No hernia is present.   Musculoskeletal:        General: Normal range of motion.     Cervical back: Normal range of motion and neck supple.     Right lower leg: No edema.     Left lower leg: No edema.  Lymphadenopathy:     Cervical: No cervical adenopathy.   Skin:    General: Skin is warm and dry.     Findings: No rash.   Neurological:     General: No focal deficit present.     Mental Status: He is alert and oriented to person, place, and time.   Psychiatric:        Mood and Affect: Mood normal.        Behavior: Behavior normal.        Thought Content: Thought content normal.        Judgment: Judgment normal.       Results for orders placed or performed in visit on 08/08/23  Uric acid   Collection Time: 08/08/23  8:35 AM  Result Value Ref Range   Uric Acid, Serum 7.6 4.0 - 7.8 mg/dL  Folate   Collection Time: 08/08/23  8:35 AM  Result Value Ref Range   Folate 21.2 >5.9 ng/mL  IBC panel   Collection Time: 08/08/23  8:35 AM  Result Value Ref Range   Iron 130 42 - 165 ug/dL   Transferrin 779.9 787.9 - 360.0 mg/dL   Saturation Ratios 57.7 20.0 - 50.0 %   TIBC 308.0 250.0 - 450.0 mcg/dL  Ferritin   Collection Time: 08/08/23  8:35 AM  Result Value Ref Range   Ferritin 169.5 22.0 - 322.0 ng/mL  Vitamin B12   Collection Time: 08/08/23  8:35 AM  Result Value Ref Range   Vitamin B-12 633 211 - 911 pg/mL  CBC with Differential/Platelet   Collection Time: 08/08/23  8:35 AM  Result Value Ref Range   WBC 4.9 4.0 - 10.5 K/uL   RBC 5.34 4.22 - 5.81 Mil/uL   Hemoglobin 15.9 13.0 - 17.0 g/dL   HCT 52.7 60.9 - 47.9 %   MCV 88.5 78.0 - 100.0 fl   MCHC 33.6 30.0 - 36.0 g/dL   RDW 86.8 88.4 - 84.4 %   Platelets 131.0 (L) 150.0 - 400.0 K/uL   Neutrophils Relative % 75.2  43.0 - 77.0 %   Lymphocytes Relative 13.7 12.0 - 46.0 %   Monocytes Relative 8.7 3.0 - 12.0 %   Eosinophils Relative 1.9 0.0 - 5.0 %   Basophils Relative 0.5 0.0 - 3.0 %   Neutro Abs 3.7 1.4 - 7.7 K/uL   Lymphs Abs 0.7 0.7 - 4.0 K/uL   Monocytes Absolute 0.4 0.1 - 1.0 K/uL   Eosinophils Absolute 0.1 0.0 - 0.7 K/uL   Basophils Absolute 0.0 0.0 - 0.1 K/uL  TSH   Collection Time: 08/08/23  8:35 AM  Result Value Ref Range   TSH 5.62 (H) 0.35 - 5.50 uIU/mL  Comprehensive metabolic panel  with GFR   Collection Time: 08/08/23  8:35 AM  Result Value Ref Range   Sodium 136 135 - 145 mEq/L   Potassium 3.7 3.5 - 5.1 mEq/L   Chloride 97 96 - 112 mEq/L   CO2 29 19 - 32 mEq/L   Glucose, Bld 194 (H) 70 - 99 mg/dL   BUN 17 6 - 23 mg/dL   Creatinine, Ser 8.92 0.40 - 1.50 mg/dL   Total Bilirubin 1.8 (H) 0.2 - 1.2 mg/dL   Alkaline Phosphatase 168 (H) 39 - 117 U/L   AST 56 (H) 0 - 37 U/L   ALT 54 (H) 0 - 53 U/L   Total Protein 6.6 6.0 - 8.3 g/dL   Albumin 4.5 3.5 - 5.2 g/dL   GFR 28.66 >39.99 mL/min   Calcium  9.2 8.4 - 10.5 mg/dL  Vitamin B1   Collection Time: 08/08/23  8:35 AM  Result Value Ref Range   Vitamin B1 (Thiamine) 10 8 - 30 nmol/L  VITAMIN D  25 Hydroxy (Vit-D Deficiency, Fractures)   Collection Time: 08/08/23  8:35 AM  Result Value Ref Range   VITD 34.80 30.00 - 100.00 ng/mL  Lipid panel   Collection Time: 08/08/23  8:35 AM  Result Value Ref Range   Cholesterol 104 0 - 200 mg/dL   Triglycerides 821.9 (H) 0.0 - 149.0 mg/dL   HDL 62.79 (L) >60.99 mg/dL   VLDL 64.3 0.0 - 59.9 mg/dL   LDL Cholesterol 31 0 - 99 mg/dL   Total CHOL/HDL Ratio 3    NonHDL 66.71   Microalbumin / creatinine urine ratio   Collection Time: 08/08/23  8:35 AM  Result Value Ref Range   Microalb, Ur <0.7 mg/dL   Creatinine,U 72.7 mg/dL   Microalb Creat Ratio Unable to calculate 0.0 - 30.0 mg/g  Hemoglobin A1c   Collection Time: 08/08/23  8:35 AM  Result Value Ref Range   Hgb A1c MFr Bld 6.7 (H) 4.6 -  6.5 %   *Note: Due to a large number of results and/or encounters for the requested time period, some results have not been displayed. A complete set of results can be found in Results Review.   EKG - sinus bradycardia 50s with marked arrhythmia and 1st degree AV block PR , RBBB with mild LAFB   Assessment & Plan:   Problem List Items Addressed This Visit     Advanced care planning/counseling discussion - Primary (Chronic)   Previously discussed advanced directives, doesn't think would want prolonged life support. Would want wife to be HCPOA. Packet previously provided. Would want ventilation support only if he gets coronavirus infection.       Diabetes mellitus type 2 with retinopathy (HCC)   Recommend he schedule diabetic eye exam.       Relevant Medications   atorvastatin  (LIPITOR) 20 MG tablet   Semaglutide , 2 MG/DOSE, 8 MG/3ML SOPN   HTN (hypertension)   Chronic, see below regarding med changes      Relevant Medications   amLODipine  (NORVASC ) 2.5 MG tablet   atorvastatin  (LIPITOR) 20 MG tablet   carvedilol  (COREG ) 6.25 MG tablet   isosorbide  mononitrate (IMDUR ) 60 MG 24 hr tablet   Hyperlipidemia associated with type 2 diabetes mellitus (HCC)   Chronic, stable on atorvastatin  20mg  daily - continue this. LDL, nonHDL at goal H/o elevated Lp(a).  The ASCVD Risk score (Arnett DK, et al., 2019) failed to calculate for the following reasons:   Risk score cannot be calculated because patient has a medical  history suggesting prior/existing ASCVD       Relevant Medications   amLODipine  (NORVASC ) 2.5 MG tablet   atorvastatin  (LIPITOR) 20 MG tablet   Semaglutide , 2 MG/DOSE, 8 MG/3ML SOPN   carvedilol  (COREG ) 6.25 MG tablet   isosorbide  mononitrate (IMDUR ) 60 MG 24 hr tablet   GERD (gastroesophageal reflux disease)   Chronic, long term on dexilant  with pepcid . Continue this. Overdue for GI f/u - encouraged call to schedule.      Relevant Medications   dexlansoprazole   (DEXILANT ) 60 MG capsule   famotidine  (PEPCID ) 20 MG tablet   Hypothyroidism   Chronic. TSH actually elevated - and endorses hypothyroid symptoms. Will increase levothyroxine  to 137mcg daily, rec recheck thyroid  function in 6 wks.       Relevant Medications   levothyroxine  (SYNTHROID ) 137 MCG tablet   carvedilol  (COREG ) 6.25 MG tablet   Other Relevant Orders   TSH   Severe obesity (BMI 35.0-39.9) with comorbidity (HCC)   Weight gain noted. This is despite ozempic  2mg  weekly.  Increase levothyroxine  as per below.       Relevant Medications   Semaglutide , 2 MG/DOSE, 8 MG/3ML SOPN   Chronic diastolic CHF (congestive heart failure) (HCC)   Relevant Medications   amLODipine  (NORVASC ) 2.5 MG tablet   atorvastatin  (LIPITOR) 20 MG tablet   carvedilol  (COREG ) 6.25 MG tablet   isosorbide  mononitrate (IMDUR ) 60 MG 24 hr tablet   Bradycardia   Marked bradycardia noted on exam, along with irregular heart beat. In setting of recently increased carvedilol  dosing.  Update EKG - abnormal  Orthostatic drop in SBP of 14 pts Will drop carvedilol  back to 3.125mg  BID, I asked him to monitor BP on new lower dose.       Relevant Orders   EKG 12-Lead   Diabetic gastroparesis (HCC)   Relevant Medications   atorvastatin  (LIPITOR) 20 MG tablet   Semaglutide , 2 MG/DOSE, 8 MG/3ML SOPN   Bariatric surgery status   Liver cirrhosis secondary to NASH (HCC)   Chronic transaminitis, overall stable. Overdue for f/u with Dr Albertus - encouraged they reach out to GI to schedule appt.       Thrombocytopenia (HCC)   Chronic, stable. Presumed due to liver disease.       Diabetic neuropathy associated with type 2 diabetes mellitus (HCC)   Relevant Medications   atorvastatin  (LIPITOR) 20 MG tablet   Semaglutide , 2 MG/DOSE, 8 MG/3ML SOPN   Asthma   Relevant Medications   fluticasone  (FLONASE ) 50 MCG/ACT nasal spray   Situational anxiety   Continue celexa  20mg  daily.       Relevant Medications    citalopram  (CELEXA ) 20 MG tablet   Iron deficiency   Was taking daily oral iron replacement - recommend dropping to every other day dosing as recent levels stable.       Statin intolerance   Gout   Chronic, urate elevated but continue current regimen given no recent gout flares. He states he's taking allopurinol  100mg  every other day. Med list updated.       Relevant Medications   colchicine  0.6 MG tablet   Type 2 diabetes mellitus with other specified complication (HCC)   Chronic, stable on current regimen - continue this. New glucometer sent to pharmacy.       Relevant Medications   atorvastatin  (LIPITOR) 20 MG tablet   Semaglutide , 2 MG/DOSE, 8 MG/3ML SOPN     Meds ordered this encounter  Medications   amLODipine  (NORVASC ) 2.5 MG tablet  Sig: Take 1 tablet (2.5 mg total) by mouth daily.    Dispense:  90 tablet    Refill:  3   atorvastatin  (LIPITOR) 20 MG tablet    Sig: Take 1 tablet (20 mg total) by mouth daily.    Dispense:  90 tablet    Refill:  3   citalopram  (CELEXA ) 20 MG tablet    Sig: Take 1 tablet (20 mg total) by mouth daily.    Dispense:  90 tablet    Refill:  3   colchicine  0.6 MG tablet    Sig: Take 1 tablet (0.6 mg total) by mouth daily as needed (gout flare).    Dispense:  30 tablet    Refill:  3   dexlansoprazole  (DEXILANT ) 60 MG capsule    Sig: Take 1 capsule (60 mg total) by mouth daily.    Dispense:  90 capsule    Refill:  3   famotidine  (PEPCID ) 20 MG tablet    Sig: Take 1 tablet (20 mg total) by mouth at bedtime.    Dispense:  90 tablet    Refill:  3   fluticasone  (FLONASE ) 50 MCG/ACT nasal spray    Sig: Place 2 sprays into both nostrils daily.    Dispense:  16 g    Refill:  12   Semaglutide , 2 MG/DOSE, 8 MG/3ML SOPN    Sig: Inject 2 mg as directed once a week.    Dispense:  9 mL    Refill:  3   Blood Glucose Monitoring Suppl DEVI    Sig: 1 each by Does not apply route in the morning, at noon, and at bedtime. May substitute to any  manufacturer covered by patient's insurance.    Dispense:  1 each    Refill:  0   Glucose Blood (BLOOD GLUCOSE TEST STRIPS) STRP    Sig: 1 each by In Vitro route in the morning, at noon, and at bedtime. May substitute to any manufacturer covered by patient's insurance.    Dispense:  100 strip    Refill:  0   Lancet Device MISC    Sig: 1 each by Does not apply route in the morning, at noon, and at bedtime. May substitute to any manufacturer covered by patient's insurance.    Dispense:  1 each    Refill:  0   Lancets Misc. MISC    Sig: 1 each by Does not apply route in the morning, at noon, and at bedtime. May substitute to any manufacturer covered by patient's insurance.    Dispense:  100 each    Refill:  0   allopurinol  (ZYLOPRIM ) 100 MG tablet    Sig: Take 1 tablet (100 mg total) by mouth every other day.    Dispense:  45 tablet    Refill:  3   levothyroxine  (SYNTHROID ) 137 MCG tablet    Sig: Take 1 tablet (137 mcg total) by mouth daily before breakfast. TAKE ONE TAB BY MOUTH ONCE DAILY. TAKE ON AN EMPTY STOMACH WITH A GLASS OF WATER ATLEAST 30-60 MINUTES BEFORE BREAKFAST    Dispense:  90 tablet    Refill:  3    Note new does    Orders Placed This Encounter  Procedures   TSH    Standing Status:   Future    Expiration Date:   08/14/2024   EKG 12-Lead    Patient Instructions  Orthostatic vital signs today, EKG today  Schedule diabetic eye exam.  Increase levothyroxine  to 137mcg daily -  new dose sent to pharmacy. Schedule lab visit only in 6 weeks to recheck thyroid  function Work on living will and bring us  a copy when complete Good to see you today  Schedule follow up with Dr Albertus GI Return in 4 months for diabetes follow up visit.   Drop carvedilol  back to 3.125mg  twice daily - follow up with heart doctor  Follow up plan: Return in about 4 months (around 12/15/2023) for follow up visit.  Anton Blas, MD

## 2023-08-15 NOTE — Assessment & Plan Note (Signed)
 Chronic, long term on dexilant  with pepcid . Continue this. Overdue for GI f/u - encouraged call to schedule.

## 2023-08-15 NOTE — Assessment & Plan Note (Addendum)
 Chronic transaminitis, overall stable. Overdue for f/u with Dr Albertus - encouraged they reach out to GI to schedule appt.

## 2023-08-15 NOTE — Assessment & Plan Note (Signed)
 Chronic, see below regarding med changes

## 2023-08-15 NOTE — Assessment & Plan Note (Signed)
 Previously discussed advanced directives, doesn't think would want prolonged life support. Would want wife to be HCPOA. Packet previously provided. Would want ventilation support only if he gets coronavirus infection.

## 2023-08-15 NOTE — Assessment & Plan Note (Signed)
 Marked bradycardia noted on exam, along with irregular heart beat. In setting of recently increased carvedilol  dosing.  Update EKG - abnormal  Orthostatic drop in SBP of 14 pts Will drop carvedilol  back to 3.125mg  BID, I asked him to monitor BP on new lower dose.

## 2023-08-15 NOTE — Assessment & Plan Note (Signed)
Continue celexa 20mg daily.  

## 2023-08-15 NOTE — Patient Instructions (Addendum)
 Orthostatic vital signs today, EKG today  Schedule diabetic eye exam.  Increase levothyroxine  to 137mcg daily - new dose sent to pharmacy. Schedule lab visit only in 6 weeks to recheck thyroid  function Work on living will and bring us  a copy when complete Good to see you today  Schedule follow up with Dr Albertus GI Return in 4 months for diabetes follow up visit.   Drop carvedilol  back to 3.125mg  twice daily - follow up with heart doctor

## 2023-08-15 NOTE — Assessment & Plan Note (Signed)
 Weight gain noted. This is despite ozempic  2mg  weekly.  Increase levothyroxine  as per below.

## 2023-08-16 ENCOUNTER — Encounter: Payer: Self-pay | Admitting: Family Medicine

## 2023-08-16 NOTE — Telephone Encounter (Signed)
 Bumex  Last filled 05/03/23 Last OV:  08/15/23, CPE Next OV:  12/20/23, 4 mo DM f/u

## 2023-08-18 ENCOUNTER — Other Ambulatory Visit: Payer: Self-pay | Admitting: Family Medicine

## 2023-08-18 NOTE — Telephone Encounter (Signed)
 Copied from CRM [id number]. Topic: Clinical - Medication Refill >> Aug 18, 2023 12:14 PM Adrionna Y wrote: Medication: bumetanide  (BUMEX ) 1 MG tablet   Has the patient contacted their pharmacy? Yes (Agent: If no, request that the patient contact the pharmacy for the refill. If patient does not wish to contact the pharmacy document the reason why and proceed with request.) (Agent: If yes, when and what did the pharmacy advise?)  This is the patient's preferred pharmacy:  [facility] - [place], [place] - [address] [place] [address] Phone: [phone] Fax: [phone]  Is this the correct pharmacy for this prescription? Yes If no, delete pharmacy and type the correct one.   Has the prescription been filled recently? No  Is the patient out of the medication? Yes,took last one today   Has the patient been seen for an appointment in the last year OR does the patient have an upcoming appointment? Yes  Can we respond through MyChart? Yes  Agent: Please be advised that Rx refills may take up to 3 business days. We ask that you follow-up with your pharmacy.

## 2023-08-18 NOTE — Telephone Encounter (Signed)
 Duplicate request (see 624/25 refill note).  Unpinned rx.

## 2023-08-21 NOTE — Telephone Encounter (Signed)
 ERx

## 2023-08-22 ENCOUNTER — Encounter: Payer: Self-pay | Admitting: Dermatology

## 2023-08-22 ENCOUNTER — Ambulatory Visit: Admitting: Dermatology

## 2023-08-22 DIAGNOSIS — Z872 Personal history of diseases of the skin and subcutaneous tissue: Secondary | ICD-10-CM | POA: Diagnosis not present

## 2023-08-22 DIAGNOSIS — L578 Other skin changes due to chronic exposure to nonionizing radiation: Secondary | ICD-10-CM

## 2023-08-22 DIAGNOSIS — W908XXA Exposure to other nonionizing radiation, initial encounter: Secondary | ICD-10-CM | POA: Diagnosis not present

## 2023-08-22 DIAGNOSIS — D2362 Other benign neoplasm of skin of left upper limb, including shoulder: Secondary | ICD-10-CM

## 2023-08-22 DIAGNOSIS — L570 Actinic keratosis: Secondary | ICD-10-CM

## 2023-08-22 DIAGNOSIS — Z1283 Encounter for screening for malignant neoplasm of skin: Secondary | ICD-10-CM | POA: Diagnosis not present

## 2023-08-22 DIAGNOSIS — D492 Neoplasm of unspecified behavior of bone, soft tissue, and skin: Secondary | ICD-10-CM

## 2023-08-22 DIAGNOSIS — D1801 Hemangioma of skin and subcutaneous tissue: Secondary | ICD-10-CM

## 2023-08-22 DIAGNOSIS — L814 Other melanin hyperpigmentation: Secondary | ICD-10-CM | POA: Diagnosis not present

## 2023-08-22 DIAGNOSIS — L821 Other seborrheic keratosis: Secondary | ICD-10-CM | POA: Diagnosis not present

## 2023-08-22 DIAGNOSIS — D229 Melanocytic nevi, unspecified: Secondary | ICD-10-CM

## 2023-08-22 DIAGNOSIS — L820 Inflamed seborrheic keratosis: Secondary | ICD-10-CM | POA: Diagnosis not present

## 2023-08-22 NOTE — Patient Instructions (Addendum)
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
## 2023-08-22 NOTE — Progress Notes (Signed)
 Follow-Up Visit   Subjective  Chad [name] is a 68 y.o. male who presents for the following: Skin Cancer Screening and Full Body Skin Exam, hx of precancers, hx of ISK's   The patient presents for Total-Body Skin Exam (TBSE) for skin cancer screening and mole check. The patient has spots, moles and lesions to be evaluated, some may be new or changing and the patient may have concern these could be cancer.  Wife is with patient and contributes to history.   The following portions of the chart were reviewed this encounter and updated as appropriate: medications, allergies, medical history  Review of Systems:  No other skin or systemic complaints except as noted in HPI or Assessment and Plan.  Objective  Well appearing patient in no apparent distress; mood and affect are within normal limits.  A full examination was performed including scalp, head, eyes, ears, nose, lips, neck, chest, axillae, abdomen, back, buttocks, bilateral upper extremities, bilateral lower extremities, hands, feet, fingers, toes, fingernails, and toenails. All findings within normal limits unless otherwise noted below.   Relevant physical exam findings are noted in the Assessment and Plan.  left clavicle, dorsum nose (2) Stuck-on, waxy, tan-brown papules and plaques -- Discussed benign etiology and prognosis.  left hand x 3 (3) Erythematous thin papules/macules with gritty scale.  left face x 2, right ear x 1, left ear x 1 (4) Erythematous thin papules/macules with gritty scale.  Left Shoulder - Anterior 0.7 cm irregular flat pink papule    Assessment & Plan   SKIN CANCER SCREENING PERFORMED TODAY.  LENTIGINES, SEBORRHEIC KERATOSES, HEMANGIOMAS - Benign normal skin lesions - Benign-appearing - Call for any changes  MELANOCYTIC NEVI - Tan-brown and/or pink-flesh-colored symmetric macules and papules - Benign appearing on exam today - Observation - Call clinic for new or changing moles - Recommend  daily use of broad spectrum spf 30+ sunscreen to sun-exposed areas.   INFLAMED SEBORRHEIC KERATOSIS (2) left clavicle, dorsum nose (2) Symptomatic, irritating, patient would like treated.  Destruction of lesion - left clavicle, dorsum nose (2) Complexity: simple   Destruction method: cryotherapy   Informed consent: discussed and consent obtained   Timeout:  patient name, date of birth, surgical site, and procedure verified Lesion destroyed using liquid nitrogen: Yes   Region frozen until ice ball extended beyond lesion: Yes   Outcome: patient tolerated procedure well with no complications   Post-procedure details: wound care instructions given   HYPERTROPHIC ACTINIC KERATOSIS (3) left hand x 3 (3) Recheck in 3 months if not gone consider biopsy  Destruction of lesion - left hand x 3 (3) Complexity: simple   Destruction method: cryotherapy   Informed consent: discussed and consent obtained   Timeout:  patient name, date of birth, surgical site, and procedure verified Lesion destroyed using liquid nitrogen: Yes   Region frozen until ice ball extended beyond lesion: Yes   Outcome: patient tolerated procedure well with no complications   Post-procedure details: wound care instructions given   AK (ACTINIC KERATOSIS) (4) left face x 2, right ear x 1, left ear x 1 (4) ACTINIC DAMAGE - chronic, secondary to cumulative UV radiation exposure/sun exposure over time - diffuse scaly erythematous macules with underlying dyspigmentation - Recommend daily broad spectrum sunscreen SPF 30+ to sun-exposed areas, reapply every 2 hours as needed.  - Recommend staying in the shade or wearing long sleeves, sun glasses (UVA+UVB protection) and wide brim hats (4-inch brim around the entire circumference of the hat). -  Call for new or changing lesions.  Destruction of lesion - left face x 2, right ear x 1, left ear x 1 (4) Complexity: simple   Destruction method: cryotherapy   Informed consent: discussed  and consent obtained   Timeout:  patient name, date of birth, surgical site, and procedure verified Lesion destroyed using liquid nitrogen: Yes   Region frozen until ice ball extended beyond lesion: Yes   Outcome: patient tolerated procedure well with no complications   Post-procedure details: wound care instructions given   NEOPLASM OF SKIN Left Shoulder - Anterior Skin / nail biopsy Type of biopsy: tangential   Informed consent: discussed and consent obtained   Patient was prepped and draped in usual sterile fashion: area prepped with alochol. Anesthesia: the lesion was anesthetized in a standard fashion   Anesthetic:  1% lidocaine  w/ epinephrine  1-100,000 buffered w/ 8.4% NaHCO3 Instrument used: flexible razor blade   Hemostasis achieved with: pressure, aluminum chloride and electrodesiccation   Outcome: patient tolerated procedure well   Post-procedure details: wound care instructions given   Post-procedure details comment:  Ointment and small bandage Specimen 1 - Surgical pathology Differential Diagnosis: R/O Dermatofibroma vs CA  Check Margins: No   Return in about 3 months (around 11/22/2023) for recheck Aks .  IFay Kirks, CMA, am acting as scribe for Alm Rhyme, MD .   Documentation: I have reviewed the above documentation for accuracy and completeness, and I agree with the above.  Alm Rhyme, MD

## 2023-08-23 ENCOUNTER — Encounter: Payer: Self-pay | Admitting: Physician Assistant

## 2023-08-23 ENCOUNTER — Other Ambulatory Visit

## 2023-08-23 ENCOUNTER — Ambulatory Visit (INDEPENDENT_AMBULATORY_CARE_PROVIDER_SITE_OTHER): Admitting: Physician Assistant

## 2023-08-23 ENCOUNTER — Other Ambulatory Visit (INDEPENDENT_AMBULATORY_CARE_PROVIDER_SITE_OTHER)

## 2023-08-23 VITALS — BP 112/60 | HR 78 | Ht 69.0 in | Wt 261.4 lb

## 2023-08-23 DIAGNOSIS — K7581 Nonalcoholic steatohepatitis (NASH): Secondary | ICD-10-CM

## 2023-08-23 DIAGNOSIS — K219 Gastro-esophageal reflux disease without esophagitis: Secondary | ICD-10-CM | POA: Diagnosis not present

## 2023-08-23 DIAGNOSIS — R7989 Other specified abnormal findings of blood chemistry: Secondary | ICD-10-CM | POA: Diagnosis not present

## 2023-08-23 DIAGNOSIS — Z860101 Personal history of adenomatous and serrated colon polyps: Secondary | ICD-10-CM

## 2023-08-23 DIAGNOSIS — K766 Portal hypertension: Secondary | ICD-10-CM

## 2023-08-23 DIAGNOSIS — K7460 Unspecified cirrhosis of liver: Secondary | ICD-10-CM

## 2023-08-23 DIAGNOSIS — R161 Splenomegaly, not elsewhere classified: Secondary | ICD-10-CM

## 2023-08-23 DIAGNOSIS — K3184 Gastroparesis: Secondary | ICD-10-CM

## 2023-08-23 LAB — CBC WITH DIFFERENTIAL/PLATELET
Basophils Absolute: 0 10*3/uL (ref 0.0–0.1)
Basophils Relative: 0.6 % (ref 0.0–3.0)
Eosinophils Absolute: 0.1 10*3/uL (ref 0.0–0.7)
Eosinophils Relative: 1.5 % (ref 0.0–5.0)
HCT: 48.7 % (ref 39.0–52.0)
Hemoglobin: 16.4 g/dL (ref 13.0–17.0)
Lymphocytes Relative: 15.3 % (ref 12.0–46.0)
Lymphs Abs: 1 10*3/uL (ref 0.7–4.0)
MCHC: 33.7 g/dL (ref 30.0–36.0)
MCV: 87.7 fl (ref 78.0–100.0)
Monocytes Absolute: 0.5 10*3/uL (ref 0.1–1.0)
Monocytes Relative: 7.9 % (ref 3.0–12.0)
Neutro Abs: 5 10*3/uL (ref 1.4–7.7)
Neutrophils Relative %: 74.7 % (ref 43.0–77.0)
Platelets: 162 10*3/uL (ref 150.0–400.0)
RBC: 5.55 Mil/uL (ref 4.22–5.81)
RDW: 13.1 % (ref 11.5–15.5)
WBC: 6.7 10*3/uL (ref 4.0–10.5)

## 2023-08-23 LAB — COMPREHENSIVE METABOLIC PANEL WITH GFR
ALT: 52 U/L (ref 0–53)
AST: 54 U/L — ABNORMAL HIGH (ref 0–37)
Albumin: 4.6 g/dL (ref 3.5–5.2)
Alkaline Phosphatase: 190 U/L — ABNORMAL HIGH (ref 39–117)
BUN: 18 mg/dL (ref 6–23)
CO2: 28 meq/L (ref 19–32)
Calcium: 9.3 mg/dL (ref 8.4–10.5)
Chloride: 97 meq/L (ref 96–112)
Creatinine, Ser: 1.16 mg/dL (ref 0.40–1.50)
GFR: 64.73 mL/min (ref >60.00)
Glucose, Bld: 151 mg/dL — ABNORMAL HIGH (ref 70–99)
Potassium: 3.9 meq/L (ref 3.5–5.1)
Sodium: 134 meq/L — ABNORMAL LOW (ref 135–145)
Total Bilirubin: 1.7 mg/dL — ABNORMAL HIGH (ref 0.2–1.2)
Total Protein: 7.1 g/dL (ref 6.0–8.3)

## 2023-08-23 LAB — SURGICAL PATHOLOGY

## 2023-08-23 LAB — PROTIME-INR
INR: 1.2 ratio — ABNORMAL HIGH (ref 0.8–1.0)
Prothrombin Time: 12.5 s (ref 9.6–13.1)

## 2023-08-23 MED ORDER — METOCLOPRAMIDE HCL 5 MG PO TABS: 5.0000 mg | ORAL_TABLET | Freq: Four times a day (QID) | ORAL | 12 refills | Status: DC | PRN

## 2023-08-23 MED ORDER — FAMOTIDINE 20 MG PO TABS: 20.0000 mg | ORAL_TABLET | Freq: Every day | ORAL | 3 refills | Status: DC

## 2023-08-23 MED ORDER — DEXLANSOPRAZOLE 60 MG PO CPDR
60.0000 mg | DELAYED_RELEASE_CAPSULE | Freq: Every day | ORAL | 3 refills | Status: DC
Start: 2023-08-23 — End: 2023-12-12

## 2023-08-23 NOTE — Patient Instructions (Addendum)
 Your provider has requested that you go to the basement level for lab work before leaving today. Press B on the elevator. The lab is located at the first door on the left as you exit the elevator.  We have sent the following medications to your pharmacy for you to pick up at your convenience: Dexil;ant 60 mg once daily, famotidine  20 mg daily at bedtime, Metoclopramide  5 mg every 6 hours as needed  You have been scheduled for an abdominal ultrasound at [facility]) on 08/24/23 at 8:00 am. Please arrive 30 minutes prior to your appointment for registration. Make certain not to have anything to eat or drink after midnight prior to your appointment. Should you need to reschedule your appointment, please contact radiology at [phone]. This test typically takes about 30 minutes to perform.  Please follow up sooner if symptoms increase or worsen  Due to recent changes in healthcare laws, you may see the results of your imaging and laboratory studies on MyChart before your provider has had a chance to review them.  We understand that in some cases there may be results that are confusing or concerning to you. Not all laboratory results come back in the same time frame and the provider may be waiting for multiple results in order to interpret others.  Please give us  48 hours in order for your provider to thoroughly review all the results before contacting the office for clarification of your results.   Thank you for trusting me with your gastrointestinal care!   Ellouise Console, PA-C _______________________________________________________  If your blood pressure at your visit was 140/90 or greater, please contact your primary care physician to follow up on this.  _______________________________________________________  If you are age 68 or older, your body mass index should be between 23-30. Your Body mass index is 38.6 kg/m. If this is out of the aforementioned range listed, please  consider follow up with your Primary Care Provider.  If you are age 6 or younger, your body mass index should be between 19-25. Your Body mass index is 38.6 kg/m. If this is out of the aformentioned range listed, please consider follow up with your Primary Care Provider.   ________________________________________________________  The [facility] providers would like to encourage you to use MYCHART to communicate with providers for non-urgent requests or questions.  Due to long hold times on the telephone, sending your provider a message by [facility] may be a faster and more efficient way to get a response.  Please allow 48 business hours for a response.  Please remember that this is for non-urgent requests.  _______________________________________________________

## 2023-08-24 ENCOUNTER — Ambulatory Visit
Admission: RE | Admit: 2023-08-24 | Discharge: 2023-08-24 | Disposition: A | Discharge status: Home / Self Care | Source: Ambulatory Visit | Attending: Physician Assistant | Admitting: Physician Assistant

## 2023-08-24 ENCOUNTER — Ambulatory Visit: Payer: Self-pay | Admitting: Physician Assistant

## 2023-08-24 DIAGNOSIS — K7581 Nonalcoholic steatohepatitis (NASH): Secondary | ICD-10-CM | POA: Diagnosis not present

## 2023-08-24 DIAGNOSIS — K7460 Unspecified cirrhosis of liver: Secondary | ICD-10-CM | POA: Diagnosis not present

## 2023-08-24 DIAGNOSIS — Z9049 Acquired absence of other specified parts of digestive tract: Secondary | ICD-10-CM | POA: Diagnosis not present

## 2023-08-24 LAB — AFP TUMOR MARKER: AFP-Tumor Marker: 4.3 ng/mL (ref <6.1)

## 2023-08-28 ENCOUNTER — Ambulatory Visit: Payer: Self-pay | Admitting: Dermatology

## 2023-08-28 NOTE — Telephone Encounter (Addendum)
 Called and discussed results with patient. He verbalized understanding and denied further questions.  ----- Message from Alm Rhyme sent at 08/28/2023  5:28 PM EDT ----- FINAL DIAGNOSIS        1. Skin, left shoulder - anterior :       DERMATOFIBROMA, BASE INVOLVED   Benign Dermatofibroma May recur No further treatment needed at this time ----- Message ----- From: Interface, Lab In [facility] Sent: 08/23/2023   6:17 PM EDT To: Alm JAYSON Rhyme, MD

## 2023-09-07 DIAGNOSIS — I5032 Chronic diastolic (congestive) heart failure: Secondary | ICD-10-CM | POA: Diagnosis not present

## 2023-09-07 DIAGNOSIS — E782 Mixed hyperlipidemia: Secondary | ICD-10-CM | POA: Diagnosis not present

## 2023-09-07 DIAGNOSIS — I25118 Atherosclerotic heart disease of native coronary artery with other forms of angina pectoris: Secondary | ICD-10-CM | POA: Diagnosis not present

## 2023-09-07 DIAGNOSIS — I10 Essential (primary) hypertension: Secondary | ICD-10-CM | POA: Diagnosis not present

## 2023-09-26 ENCOUNTER — Other Ambulatory Visit (INDEPENDENT_AMBULATORY_CARE_PROVIDER_SITE_OTHER)

## 2023-09-26 DIAGNOSIS — E039 Hypothyroidism, unspecified: Secondary | ICD-10-CM

## 2023-09-26 LAB — TSH: TSH: 3.01 u[IU]/mL (ref 0.35–5.50)

## 2023-09-28 ENCOUNTER — Ambulatory Visit: Payer: Self-pay | Admitting: Family Medicine

## 2023-10-14 ENCOUNTER — Other Ambulatory Visit: Payer: Self-pay

## 2023-10-14 ENCOUNTER — Emergency Department (HOSPITAL_COMMUNITY)

## 2023-10-14 ENCOUNTER — Emergency Department (HOSPITAL_COMMUNITY)
Admission: EM | Admit: 2023-10-14 | Discharge: 2023-10-15 | Disposition: A | Discharge status: Home / Self Care | Attending: Emergency Medicine | Admitting: Emergency Medicine

## 2023-10-14 ENCOUNTER — Encounter (HOSPITAL_COMMUNITY): Payer: Self-pay | Admitting: Emergency Medicine

## 2023-10-14 DIAGNOSIS — I2510 Atherosclerotic heart disease of native coronary artery without angina pectoris: Secondary | ICD-10-CM | POA: Diagnosis not present

## 2023-10-14 DIAGNOSIS — R079 Chest pain, unspecified: Secondary | ICD-10-CM | POA: Diagnosis not present

## 2023-10-14 DIAGNOSIS — I700 Atherosclerosis of aorta: Secondary | ICD-10-CM | POA: Diagnosis not present

## 2023-10-14 DIAGNOSIS — R072 Precordial pain: Secondary | ICD-10-CM | POA: Diagnosis not present

## 2023-10-14 DIAGNOSIS — R0789 Other chest pain: Secondary | ICD-10-CM | POA: Diagnosis not present

## 2023-10-14 DIAGNOSIS — K7460 Unspecified cirrhosis of liver: Secondary | ICD-10-CM | POA: Diagnosis not present

## 2023-10-14 DIAGNOSIS — Z7982 Long term (current) use of aspirin: Secondary | ICD-10-CM | POA: Diagnosis not present

## 2023-10-14 DIAGNOSIS — I499 Cardiac arrhythmia, unspecified: Secondary | ICD-10-CM | POA: Diagnosis not present

## 2023-10-14 DIAGNOSIS — R0602 Shortness of breath: Secondary | ICD-10-CM | POA: Diagnosis not present

## 2023-10-14 DIAGNOSIS — I4510 Unspecified right bundle-branch block: Secondary | ICD-10-CM | POA: Diagnosis not present

## 2023-10-14 DIAGNOSIS — R161 Splenomegaly, not elsewhere classified: Secondary | ICD-10-CM | POA: Diagnosis not present

## 2023-10-14 DIAGNOSIS — I4430 Unspecified atrioventricular block: Secondary | ICD-10-CM | POA: Diagnosis not present

## 2023-10-14 LAB — CBC
HCT: 45.4 % (ref 39.0–52.0)
Hemoglobin: 15.8 g/dL (ref 13.0–17.0)
MCH: 30.3 pg (ref 26.0–34.0)
MCHC: 34.8 g/dL (ref 30.0–36.0)
MCV: 87 fL (ref 80.0–100.0)
Platelets: 162 K/uL (ref 150–400)
RBC: 5.22 MIL/uL (ref 4.22–5.81)
RDW: 12.7 % (ref 11.5–15.5)
WBC: 6 K/uL (ref 4.0–10.5)
nRBC: 0 % (ref 0.0–0.2)

## 2023-10-14 LAB — BASIC METABOLIC PANEL WITH GFR
Anion gap: 12 (ref 5–15)
BUN: 20 mg/dL (ref 8–23)
CO2: 22 mmol/L (ref 22–32)
Calcium: 8.9 mg/dL (ref 8.9–10.3)
Chloride: 100 mmol/L (ref 98–111)
Creatinine, Ser: 1.16 mg/dL (ref 0.61–1.24)
GFR, Estimated: >60 mL/min (ref >60)
Glucose, Bld: 179 mg/dL — ABNORMAL HIGH (ref 70–99)
Potassium: 3.6 mmol/L (ref 3.5–5.1)
Sodium: 134 mmol/L — ABNORMAL LOW (ref 135–145)

## 2023-10-14 LAB — TROPONIN I (HIGH SENSITIVITY): Troponin I (High Sensitivity): 6 ng/L (ref <18)

## 2023-10-14 NOTE — ED Triage Notes (Signed)
 Patient arrives via [facility] from home for chest pain that started at 8pm tonight. Patient describes the pain as sharp on the left side and reports it radiates to his left arm. Reports nausea. Patient took 324mg  aspirin  and 3 nitroglycerin  PTA. Patient also reports generalized weakness x1 week with syncopal episode yesterday.   18g LFA- 250ml NS, 6mg  morphine  PTA

## 2023-10-14 NOTE — ED Provider Triage Note (Signed)
 Emergency Medicine Provider Triage Evaluation Note  Chad [name] , a 68 y.o. male  was evaluated in triage.  Pt complains of feeling poorly. CP onset 8pm tonight, took a nitroglycerin  at home and then developed pain in the left arm. Took 2 more nitroglycerin , BP 91/51, felt weak. Took 4 ASA at home.   Review of Systems  Positive:  Negative:   Physical Exam  BP 116/66 (BP Location: Right Arm)   Pulse 68   Temp (!) 97.4 F (36.3 C)   Resp (!) 24   Ht 5' 9 (1.753 m)   Wt 113.4 kg   SpO2 99%   BMI 36.92 kg/m  Gen:   Awake, no distress   Resp:  Normal effort  MSK:   Moves extremities without difficulty  Other:    Medical Decision Making  Medically screening exam initiated at 11:17 PM.  Appropriate orders placed.  Chad [name] was informed that the remainder of the evaluation will be completed by another provider, this initial triage assessment does not replace that evaluation, and the importance of remaining in the ED until their evaluation is complete.     Chad Leita LABOR, PA-C 10/14/23 2318

## 2023-10-15 ENCOUNTER — Emergency Department (HOSPITAL_COMMUNITY)

## 2023-10-15 DIAGNOSIS — R079 Chest pain, unspecified: Secondary | ICD-10-CM | POA: Diagnosis not present

## 2023-10-15 DIAGNOSIS — R161 Splenomegaly, not elsewhere classified: Secondary | ICD-10-CM | POA: Diagnosis not present

## 2023-10-15 DIAGNOSIS — I2510 Atherosclerotic heart disease of native coronary artery without angina pectoris: Secondary | ICD-10-CM | POA: Diagnosis not present

## 2023-10-15 LAB — TROPONIN I (HIGH SENSITIVITY): Troponin I (High Sensitivity): 6 ng/L (ref <18)

## 2023-10-15 MED ORDER — IOHEXOL 350 MG/ML SOLN
75.0000 mL | Freq: Once | INTRAVENOUS | Status: AC | PRN
  Administered 2023-10-15: 75 mL via INTRAVENOUS

## 2023-10-15 MED ORDER — KETOROLAC TROMETHAMINE 30 MG/ML IJ SOLN
15.0000 mg | Freq: Once | INTRAMUSCULAR | Status: AC
  Administered 2023-10-15: 15 mg via INTRAVENOUS
  Filled 2023-10-15: qty 1

## 2023-10-15 MED ORDER — PANTOPRAZOLE SODIUM 40 MG PO TBEC: 40.0000 mg | DELAYED_RELEASE_TABLET | Freq: Every day | ORAL | 0 refills | Status: DC

## 2023-10-15 NOTE — ED Notes (Signed)
 Patient transported to CT

## 2023-10-15 NOTE — ED Provider Notes (Signed)
 [place] EMERGENCY DEPARTMENT AT [facility] Provider Note   CSN: [id number] Arrival date & time: 10/14/23  2238     Patient presents with: Chest Pain   Chad [name] is a 68 y.o. male.   The history is provided by the patient.  Chest Pain Pain location:  L chest Pain radiates to:  Does not radiate Pain severity:  Moderate Onset quality:  Gradual Duration: hours. Timing:  Constant Progression:  Unchanged Chronicity:  New Context: not breathing   Relieved by:  Nothing Worsened by:  Nothing Ineffective treatments:  None tried Associated symptoms: no abdominal pain   Risk factors: no aortic disease        Prior to Admission medications   Medication Sig Start Date End Date Taking? Authorizing Provider  pantoprazole  (PROTONIX ) 40 MG tablet Take 1 tablet (40 mg total) by mouth daily. 10/15/23  Yes Siana Panameno, MD  allopurinol  (ZYLOPRIM ) 100 MG tablet Take 1 tablet (100 mg total) by mouth every other day. 08/15/23   Rilla Baller, MD  amLODipine  (NORVASC ) 2.5 MG tablet Take 1 tablet (2.5 mg total) by mouth daily. 08/15/23   Rilla Baller, MD  aspirin  81 MG chewable tablet Chew 81 mg by mouth daily.    [provider]  atorvastatin  (LIPITOR) 20 MG tablet Take 1 tablet (20 mg total) by mouth daily. 08/15/23   Rilla Baller, MD  benzonatate  (TESSALON ) 200 MG capsule Take 1 capsule (200 mg total) by mouth 2 (two) times daily as needed for cough. 05/04/23   Bedsole, Amy E, MD  Blood Glucose Monitoring Suppl DEVI 1 each by Does not apply route in the morning, at noon, and at bedtime. May substitute to any manufacturer covered by patient's insurance. 08/15/23   Rilla Baller, MD  bumetanide  (BUMEX ) 1 MG tablet TAKE THREE TABLETS (3 MG TOTAL) BY MOUTH DAILY. 08/21/23   Rilla Baller, MD  carvedilol  (COREG ) 6.25 MG tablet Take 0.5 tablets (3.125 mg total) by mouth 2 (two) times daily with a meal. 08/15/23   Rilla Baller, MD  citalopram  (CELEXA )  20 MG tablet Take 1 tablet (20 mg total) by mouth daily. 08/15/23   Rilla Baller, MD  colchicine  0.6 MG tablet Take 1 tablet (0.6 mg total) by mouth daily as needed (gout flare). 08/15/23   Rilla Baller, MD  dapagliflozin propanediol (FARXIGA) 10 MG TABS tablet Take 10 mg by mouth daily before breakfast. 10/08/18   Rilla Baller, MD  dexlansoprazole  (DEXILANT ) 60 MG capsule Take 1 capsule (60 mg total) by mouth daily. 08/23/23   [place], PA-C  famotidine  (PEPCID ) 20 MG tablet Take 1 tablet (20 mg total) by mouth at bedtime. 08/23/23   [place], PA-C  ferrous sulfate  325 (65 FE) MG EC tablet Take 1 tablet (325 mg total) by mouth every other day. 08/02/22   Rilla Baller, MD  fluticasone  (FLONASE ) 50 MCG/ACT nasal spray Place 2 sprays into both nostrils daily. 08/15/23   Rilla Baller, MD  isosorbide  mononitrate (IMDUR ) 60 MG 24 hr tablet Take 1 tablet (60 mg total) by mouth daily. 08/15/23   Rilla Baller, MD  levothyroxine  (SYNTHROID ) 137 MCG tablet Take 1 tablet (137 mcg total) by mouth daily before breakfast. TAKE ONE TAB BY MOUTH ONCE DAILY. TAKE ON AN EMPTY STOMACH WITH A GLASS OF WATER ATLEAST 30-60 MINUTES BEFORE BREAKFAST 08/15/23   Rilla Baller, MD  Magnesium  400 MG TABS Take 400 mg by mouth 2 (two) times daily.    [provider]  metoCLOPramide  (  REGLAN ) 5 MG tablet Take 1 tablet (5 mg total) by mouth every 6 (six) hours as needed for nausea. Please schedule a follow up appointment for further refills. Thank you 08/23/23   [place], PA-C  metolazone (ZAROXOLYN) 2.5 MG tablet Take 1 tablet (2.5 mg total) by mouth once a week. As needed for fluid/swelling 06/25/21   Rilla Baller, MD  Multiple Vitamin (MULITIVITAMIN WITH MINERALS) TABS Take 1 tablet by mouth daily.    [provider]  nitroGLYCERIN  (NITROSTAT ) 0.4 MG SL tablet Place 1 tablet (0.4 mg total) under the tongue every 5 (five) minutes as needed for chest pain. 12/19/16   [place] LABOR, FNP  ondansetron  (ZOFRAN -ODT) 4 MG disintegrating tablet Take 4 mg by mouth every 6 (six) hours as needed. 03/17/23   [provider]  potassium chloride  (KLOR-CON ) 10 MEQ tablet TAKE ONE TABLET BY MOUTH TWICE A DAY 06/28/23   Rilla Baller, MD  Semaglutide , 2 MG/DOSE, 8 MG/3ML SOPN Inject 2 mg as directed once a week. 08/15/23   Rilla Baller, MD  spironolactone  (ALDACTONE ) 25 MG tablet Take 25 mg by mouth daily.    [provider]  VENTOLIN  HFA 108 (90 Base) MCG/ACT inhaler INHALE 2 PUFFS INTO THE LUNGS EVERY 6 HOURS AS NEEDED FOR WHEEZING OR SHORTNESS OF BREATH 05/04/23   Bedsole, Amy E, MD  vitamin B-12 (CYANOCOBALAMIN ) 500 MCG tablet Take 1 tablet (500 mcg total) by mouth daily. 03/19/20   Rilla Baller, MD  vitamin E  400 UNIT capsule Take 400 Units by mouth daily.    [provider]    Allergies: Metformin  and related    Review of Systems  Cardiovascular:  Positive for chest pain.  Gastrointestinal:  Negative for abdominal pain.    Updated Vital Signs BP (!) 106/57   Pulse 71   Temp 97.9 F (36.6 C) (Oral)   Resp 19   Ht 5' 9 (1.753 m)   Wt 113.4 kg   SpO2 93%   BMI 36.92 kg/m   Physical Exam Vitals and nursing note reviewed.  Constitutional:      General: He is not in acute distress.    Appearance: Normal appearance. He is well-developed. He is not diaphoretic.  HENT:     Head: Normocephalic and atraumatic.     Nose: Nose normal.  Eyes:     Conjunctiva/sclera: Conjunctivae normal.     Pupils: Pupils are equal, round, and reactive to light.  Cardiovascular:     Rate and Rhythm: Normal rate and regular rhythm.     Pulses: Normal pulses.     Heart sounds: Normal heart sounds.  Pulmonary:     Effort: Pulmonary effort is normal.     Breath sounds: Normal breath sounds. No wheezing or rales.  Abdominal:     General: Bowel sounds are normal.     Palpations: Abdomen is soft.     Tenderness: There is no abdominal tenderness.  There is no guarding or rebound.  Musculoskeletal:        General: Normal range of motion.     Cervical back: Normal range of motion and neck supple.  Skin:    General: Skin is warm and dry.     Capillary Refill: Capillary refill takes less than 2 seconds.  Neurological:     General: No focal deficit present.     Mental Status: He is alert and oriented to person, place, and time.  Psychiatric:        Behavior: Behavior normal.     (  all labs ordered are listed, but only abnormal results are displayed) Results for orders placed or performed during the hospital encounter of 10/14/23  Basic metabolic panel   Collection Time: 10/14/23 10:57 PM  Result Value Ref Range   Sodium 134 (L) 135 - 145 mmol/L   Potassium 3.6 3.5 - 5.1 mmol/L   Chloride 100 98 - 111 mmol/L   CO2 22 22 - 32 mmol/L   Glucose, Bld 179 (H) 70 - 99 mg/dL   BUN 20 8 - 23 mg/dL   Creatinine, Ser 8.83 0.61 - 1.24 mg/dL   Calcium  8.9 8.9 - 10.3 mg/dL   GFR, Estimated >39 >39 mL/min   Anion gap 12 5 - 15  CBC   Collection Time: 10/14/23 10:57 PM  Result Value Ref Range   WBC 6.0 4.0 - 10.5 K/uL   RBC 5.22 4.22 - 5.81 MIL/uL   Hemoglobin 15.8 13.0 - 17.0 g/dL   HCT 54.5 60.9 - 47.9 %   MCV 87.0 80.0 - 100.0 fL   MCH 30.3 26.0 - 34.0 pg   MCHC 34.8 30.0 - 36.0 g/dL   RDW 87.2 88.4 - 84.4 %   Platelets 162 150 - 400 K/uL   nRBC 0.0 0.0 - 0.2 %  Troponin I (High Sensitivity)   Collection Time: 10/14/23 10:57 PM  Result Value Ref Range   Troponin I (High Sensitivity) 6 <18 ng/L  Troponin I (High Sensitivity)   Collection Time: 10/15/23  1:15 AM  Result Value Ref Range   Troponin I (High Sensitivity) 6 <18 ng/L   *Note: Due to a large number of results and/or encounters for the requested time period, some results have not been displayed. A complete set of results can be found in Results Review.   CT Angio Chest PE W and/or Wo Contrast Result Date: 10/15/2023 CLINICAL DATA:  68 year old male with left side  chest pain, syncope. History of cirrhosis. EXAM: CT ANGIOGRAPHY CHEST WITH CONTRAST TECHNIQUE: Multidetector CT imaging of the chest was performed using the standard protocol during bolus administration of intravenous contrast. Multiplanar CT image reconstructions and MIPs were obtained to evaluate the vascular anatomy. RADIATION DOSE REDUCTION: This exam was performed according to the departmental dose-optimization program which includes automated exposure control, adjustment of the mA and/or kV according to patient size and/or use of iterative reconstruction technique. CONTRAST:  75mL OMNIPAQUE  IOHEXOL  350 MG/ML SOLN COMPARISON:  Chest radiographs yesterday.  CTA chest 12/27/2016. Report of abdomen MRI 02/14/2023. FINDINGS: Cardiovascular: Good contrast bolus timing in the pulmonary arterial tree. No pulmonary artery filling defect. Extensive calcified coronary artery atherosclerosis and/or stent (series 6, image 201). Calcified aortic atherosclerosis. Otherwise negative visible aorta. Heart size remains normal. No pericardial effusion. Mediastinum/Nodes: Negative. No mediastinal mass or lymphadenopathy. Lungs/Pleura: Major airways are patent. Improved lung volumes compared to 2018. Occasional punctate calcified granulomas (no follow-up imaging recommended). Lungs otherwise clear. Upper Abdomen: Chronic splenomegaly. Chronic cholecystectomy. Nodular, cirrhotic liver contour. Negative visible noncontrast pancreas, adrenal glands, kidneys. Chronic postoperative or post embolization changes along the proximal lesser curve of the stomach are stable. Nondilated visible upper abdominal bowel. No pneumoperitoneum or free fluid identified. Musculoskeletal: Chronic rib fractures. Hyperostosis related widespread thoracic interbody ankylosis. Prior cervical ACDF. No acute or suspicious osseous lesion identified. Mild gynecomastia. Review of the MIP images confirms the above findings. IMPRESSION: 1. Negative for acute  pulmonary embolus. No acute or inflammatory process identified in the chest. 2. Chronic Cirrhosis and splenomegaly. 3. Advanced calcified coronary artery, Aortic Atherosclerosis (ICD10-I70.0).  Electronically Signed   By: VEAR Hurst M.D.   On: 10/15/2023 04:43   DG Chest 2 View Result Date: 10/14/2023 CLINICAL DATA:  Chest pain.  Shortness of breath.  Recent fall. EXAM: CHEST - 2 VIEW COMPARISON:  05/11/2023 FINDINGS: Heart size and pulmonary vascularity are normal. Lungs are clear. No pleural effusion or pneumothorax. Mediastinal contours appear intact. Old bilateral rib fractures. Degenerative changes in the spine. Mild aortic calcification. IMPRESSION: No active cardiopulmonary disease. Electronically Signed   By: Elsie Gravely M.D.   On: 10/14/2023 23:35    EKG: EKG Interpretation Date/Time:  Sunday October 15 2023 01:16:09 EDT Ventricular Rate:  72 PR Interval:  341 QRS Duration:  171 QT Interval:  460 QTC Calculation: 504 R Axis:   264  Text Interpretation: Sinus rhythm Prolonged PR interval RBBB and LAFB Confirmed by Nettie, Evlyn Amason ([id number]) on 10/15/2023 2:01:25 AM  Radiology: CT Angio Chest PE W and/or Wo Contrast Result Date: 10/15/2023 CLINICAL DATA:  68 year old male with left side chest pain, syncope. History of cirrhosis. EXAM: CT ANGIOGRAPHY CHEST WITH CONTRAST TECHNIQUE: Multidetector CT imaging of the chest was performed using the standard protocol during bolus administration of intravenous contrast. Multiplanar CT image reconstructions and MIPs were obtained to evaluate the vascular anatomy. RADIATION DOSE REDUCTION: This exam was performed according to the departmental dose-optimization program which includes automated exposure control, adjustment of the mA and/or kV according to patient size and/or use of iterative reconstruction technique. CONTRAST:  75mL OMNIPAQUE  IOHEXOL  350 MG/ML SOLN COMPARISON:  Chest radiographs yesterday.  CTA chest 12/27/2016. Report of abdomen MRI  02/14/2023. FINDINGS: Cardiovascular: Good contrast bolus timing in the pulmonary arterial tree. No pulmonary artery filling defect. Extensive calcified coronary artery atherosclerosis and/or stent (series 6, image 201). Calcified aortic atherosclerosis. Otherwise negative visible aorta. Heart size remains normal. No pericardial effusion. Mediastinum/Nodes: Negative. No mediastinal mass or lymphadenopathy. Lungs/Pleura: Major airways are patent. Improved lung volumes compared to 2018. Occasional punctate calcified granulomas (no follow-up imaging recommended). Lungs otherwise clear. Upper Abdomen: Chronic splenomegaly. Chronic cholecystectomy. Nodular, cirrhotic liver contour. Negative visible noncontrast pancreas, adrenal glands, kidneys. Chronic postoperative or post embolization changes along the proximal lesser curve of the stomach are stable. Nondilated visible upper abdominal bowel. No pneumoperitoneum or free fluid identified. Musculoskeletal: Chronic rib fractures. Hyperostosis related widespread thoracic interbody ankylosis. Prior cervical ACDF. No acute or suspicious osseous lesion identified. Mild gynecomastia. Review of the MIP images confirms the above findings. IMPRESSION: 1. Negative for acute pulmonary embolus. No acute or inflammatory process identified in the chest. 2. Chronic Cirrhosis and splenomegaly. 3. Advanced calcified coronary artery, Aortic Atherosclerosis (ICD10-I70.0). Electronically Signed   By: VEAR Hurst M.D.   On: 10/15/2023 04:43   DG Chest 2 View Result Date: 10/14/2023 CLINICAL DATA:  Chest pain.  Shortness of breath.  Recent fall. EXAM: CHEST - 2 VIEW COMPARISON:  05/11/2023 FINDINGS: Heart size and pulmonary vascularity are normal. Lungs are clear. No pleural effusion or pneumothorax. Mediastinal contours appear intact. Old bilateral rib fractures. Degenerative changes in the spine. Mild aortic calcification. IMPRESSION: No active cardiopulmonary disease. Electronically Signed    By: Elsie Gravely M.D.   On: 10/14/2023 23:35     Procedures   Medications Ordered in the ED  iohexol  (OMNIPAQUE ) 350 MG/ML injection 75 mL (75 mLs Intravenous Contrast Given 10/15/23 0236)  ketorolac  (TORADOL ) 30 MG/ML injection 15 mg (15 mg Intravenous Given 10/15/23 0322)  Medical Decision Making Chest pain at rest   Amount and/or Complexity of Data Reviewed Independent Historian: spouse    Details: See above  External Data Reviewed: notes.    Details: Previous notes reviewed  Labs: ordered.    Details: 2 negative troponins 6/6. 6 white count normal, hemoglobin 15.8, normal platelets.  Sodium 134, normal potassium  Radiology: ordered and independent interpretation performed.    Details: Negative CTA  Risk Prescription drug management. Risk Details: Ruled out for MI, CHF, PE and PNA.  Likely GERD.  Stable for discharge.       Final diagnoses:  Hepatic cirrhosis, unspecified hepatic cirrhosis type, unspecified whether ascites present (HCC)  Precordial pain   No signs of systemic illness or infection. The patient is nontoxic-appearing on exam and vital signs are within normal limits.  I have reviewed the triage vital signs and the nursing notes. Pertinent labs & imaging results that were available during my care of the patient were reviewed by me and considered in my medical decision making (see chart for details). After history, exam, and medical workup I feel the patient has been appropriately medically screened and is safe for discharge home. Pertinent diagnoses were discussed with the patient. Patient was given return precautions.  ED Discharge Orders          Ordered    pantoprazole  (PROTONIX ) 40 MG tablet  Daily        10/15/23 0546               Yakima Kreitzer, MD 10/15/23 [id number]

## 2023-10-16 ENCOUNTER — Other Ambulatory Visit (HOSPITAL_BASED_OUTPATIENT_CLINIC_OR_DEPARTMENT_OTHER): Payer: Self-pay

## 2023-10-16 ENCOUNTER — Other Ambulatory Visit (HOSPITAL_COMMUNITY): Payer: Self-pay

## 2023-10-16 MED ORDER — OZEMPIC (2 MG/DOSE) 8 MG/3ML ~~LOC~~ SOPN
2.0000 mg | PEN_INJECTOR | SUBCUTANEOUS | 3 refills | Status: DC
  Filled 2023-10-16: qty 9, 84d supply, fill #0

## 2023-10-24 ENCOUNTER — Ambulatory Visit (INDEPENDENT_AMBULATORY_CARE_PROVIDER_SITE_OTHER)

## 2023-10-24 DIAGNOSIS — Z23 Encounter for immunization: Secondary | ICD-10-CM

## 2023-11-07 ENCOUNTER — Ambulatory Visit (INDEPENDENT_AMBULATORY_CARE_PROVIDER_SITE_OTHER)
Admission: RE | Admit: 2023-11-07 | Discharge: 2023-11-07 | Disposition: A | Discharge status: Home / Self Care | Source: Ambulatory Visit | Attending: Family Medicine | Admitting: Family Medicine

## 2023-11-07 ENCOUNTER — Encounter: Payer: Self-pay | Admitting: Family Medicine

## 2023-11-07 ENCOUNTER — Ambulatory Visit: Admitting: Family Medicine

## 2023-11-07 ENCOUNTER — Ambulatory Visit: Payer: Self-pay | Admitting: Family Medicine

## 2023-11-07 VITALS — BP 120/60 | HR 68 | Temp 98.0°F | Ht 69.0 in | Wt 257.5 lb

## 2023-11-07 DIAGNOSIS — R5381 Other malaise: Secondary | ICD-10-CM

## 2023-11-07 DIAGNOSIS — Z4789 Encounter for other orthopedic aftercare: Secondary | ICD-10-CM | POA: Diagnosis not present

## 2023-11-07 DIAGNOSIS — R5383 Other fatigue: Secondary | ICD-10-CM | POA: Diagnosis not present

## 2023-11-07 DIAGNOSIS — R058 Other specified cough: Secondary | ICD-10-CM | POA: Diagnosis not present

## 2023-11-07 DIAGNOSIS — R052 Subacute cough: Secondary | ICD-10-CM

## 2023-11-07 DIAGNOSIS — R059 Cough, unspecified: Secondary | ICD-10-CM | POA: Diagnosis not present

## 2023-11-07 DIAGNOSIS — R0989 Other specified symptoms and signs involving the circulatory and respiratory systems: Secondary | ICD-10-CM | POA: Diagnosis not present

## 2023-11-07 LAB — POC COVID19 BINAXNOW: SARS Coronavirus 2 Ag: NEGATIVE

## 2023-11-07 LAB — POCT INFLUENZA A/B
Influenza A, POC: NEGATIVE
Influenza B, POC: NEGATIVE

## 2023-11-07 MED ORDER — AMOXICILLIN-POT CLAVULANATE 875-125 MG PO TABS: 1.0000 | ORAL_TABLET | Freq: Two times a day (BID) | ORAL | 0 refills | Status: DC

## 2023-11-07 MED ORDER — AZITHROMYCIN 250 MG PO TABS: ORAL_TABLET | ORAL | 0 refills | Status: AC

## 2023-11-07 MED ORDER — BENZONATATE 100 MG PO CAPS
100.0000 mg | ORAL_CAPSULE | Freq: Three times a day (TID) | ORAL | 0 refills | Status: DC | PRN
Start: 2023-11-07 — End: 2023-11-13

## 2023-11-07 NOTE — Progress Notes (Signed)
 Ph: [phone] Fax: [phone]   Patient ID: Chad [name], male    DOB: [birth year], 68 y.o.   MRN: [id number]  This visit was conducted in person.  BP 120/60   Pulse 68   Temp 98 F (36.7 C) (Oral)   Ht 5' 9 (1.753 m)   Wt 257 lb 8 oz (116.8 kg)   SpO2 96%   BMI 38.03 kg/m    CC: not feeling well Subjective:   HPI: Chad [name] is a 68 y.o. male presenting on 11/07/2023 for Medical Management of Chronic Issues (Pt here for a 2 wk f/u/Pt C/o Cough, no energy, Left side pain. Headache. Sleep all the time, no appetite. This has been going on since last visit. Took Coricidin HBP Cough & Cold/Patient accompanied by wife Comer)   Recent ER visit for left sided chest pain.  Evaluation included reassuring troponins x2, normal blood work, CTA chest negative for infection or pulm embolism, did show chronic cirrhosis/splenomegaly and advanced calcified coronary arteries.   Known CAD and chronic dCHF - followed by [facility] cardiology last seen 08/2023.   2-3 wk h/o cold symptoms - cough, congestion, rhinorrhea, fatigue, body aches from coughing, hoarseness, cough productive of colored mucous. 10 days ago symptoms again worsened. R frontal sinus pressure. Feverish, chills, chest > head congestion.  Wife sick at home as well.  Treating with coricidin with limited benefit.   Decreased appetite.      Relevant past medical, surgical, family and social history reviewed and updated as indicated. Interim medical history since our last visit reviewed. Allergies and medications reviewed and updated. Outpatient Medications Prior to Visit  Medication Sig Dispense Refill   allopurinol  (ZYLOPRIM ) 100 MG tablet Take 1 tablet (100 mg total) by mouth every other day. 45 tablet 3   amLODipine  (NORVASC ) 2.5 MG tablet Take 1 tablet (2.5 mg total) by mouth daily. 90 tablet 3   aspirin  81 MG chewable tablet Chew 81 mg by mouth daily.     atorvastatin  (LIPITOR) 20 MG tablet Take 1 tablet (20 mg  total) by mouth daily. 90 tablet 3   Blood Glucose Monitoring Suppl DEVI 1 each by Does not apply route in the morning, at noon, and at bedtime. May substitute to any manufacturer covered by patient's insurance. 1 each 0   bumetanide  (BUMEX ) 1 MG tablet TAKE THREE TABLETS (3 MG TOTAL) BY MOUTH DAILY. 270 tablet 1   carvedilol  (COREG ) 6.25 MG tablet Take 0.5 tablets (3.125 mg total) by mouth 2 (two) times daily with a meal.     citalopram  (CELEXA ) 20 MG tablet Take 1 tablet (20 mg total) by mouth daily. 90 tablet 3   colchicine  0.6 MG tablet Take 1 tablet (0.6 mg total) by mouth daily as needed (gout flare). 30 tablet 3   dapagliflozin propanediol (FARXIGA) 10 MG TABS tablet Take 10 mg by mouth daily before breakfast.     dexlansoprazole  (DEXILANT ) 60 MG capsule Take 1 capsule (60 mg total) by mouth daily. 90 capsule 3   famotidine  (PEPCID ) 20 MG tablet Take 1 tablet (20 mg total) by mouth at bedtime. 90 tablet 3   ferrous sulfate  325 (65 FE) MG EC tablet Take 1 tablet (325 mg total) by mouth every other day.     fluticasone  (FLONASE ) 50 MCG/ACT nasal spray Place 2 sprays into both nostrils daily. 16 g 12   levothyroxine  (SYNTHROID ) 137 MCG tablet Take 1 tablet (137 mcg total) by mouth daily before breakfast. TAKE ONE  TAB BY MOUTH ONCE DAILY. TAKE ON AN EMPTY STOMACH WITH A GLASS OF WATER ATLEAST 30-60 MINUTES BEFORE BREAKFAST 90 tablet 3   Magnesium  400 MG TABS Take 400 mg by mouth 2 (two) times daily.     metoCLOPramide  (REGLAN ) 5 MG tablet Take 1 tablet (5 mg total) by mouth every 6 (six) hours as needed for nausea. Please schedule a follow up appointment for further refills. Thank you 90 tablet 12   metolazone (ZAROXOLYN) 2.5 MG tablet Take 1 tablet (2.5 mg total) by mouth once a week. As needed for fluid/swelling     Multiple Vitamin (MULITIVITAMIN WITH MINERALS) TABS Take 1 tablet by mouth daily.     nitroGLYCERIN  (NITROSTAT ) 0.4 MG SL tablet Place 1 tablet (0.4 mg total) under the tongue every  5 (five) minutes as needed for chest pain. 60 tablet 3   ondansetron  (ZOFRAN -ODT) 4 MG disintegrating tablet Take 4 mg by mouth every 6 (six) hours as needed.     pantoprazole  (PROTONIX ) 40 MG tablet Take 1 tablet (40 mg total) by mouth daily. 30 tablet 0   potassium chloride  (KLOR-CON ) 10 MEQ tablet TAKE ONE TABLET BY MOUTH TWICE A DAY 180 tablet 3   Semaglutide , 2 MG/DOSE, (OZEMPIC , 2 MG/DOSE,) 8 MG/3ML SOPN Inject 2 mg into the skin once a week. 9 mL 3   Semaglutide , 2 MG/DOSE, 8 MG/3ML SOPN Inject 2 mg as directed once a week. 9 mL 3   spironolactone  (ALDACTONE ) 25 MG tablet Take 25 mg by mouth daily.     VENTOLIN  HFA 108 (90 Base) MCG/ACT inhaler INHALE 2 PUFFS INTO THE LUNGS EVERY 6 HOURS AS NEEDED FOR WHEEZING OR SHORTNESS OF BREATH 18 g 5   vitamin B-12 (CYANOCOBALAMIN ) 500 MCG tablet Take 1 tablet (500 mcg total) by mouth daily.     vitamin E  400 UNIT capsule Take 400 Units by mouth daily.     benzonatate  (TESSALON ) 200 MG capsule Take 1 capsule (200 mg total) by mouth 2 (two) times daily as needed for cough. 20 capsule 0   isosorbide  mononitrate (IMDUR ) 60 MG 24 hr tablet Take 1 tablet (60 mg total) by mouth daily. (Patient not taking: Reported on 11/07/2023)     No facility-administered medications prior to visit.     Per HPI unless specifically indicated in ROS section below Review of Systems  Objective:  BP 120/60   Pulse 68   Temp 98 F (36.7 C) (Oral)   Ht 5' 9 (1.753 m)   Wt 257 lb 8 oz (116.8 kg)   SpO2 96%   BMI 38.03 kg/m   Wt Readings from Last 3 Encounters:  11/07/23 257 lb 8 oz (116.8 kg)  10/14/23 250 lb (113.4 kg)  08/23/23 261 lb 6 oz (118.6 kg)      Physical Exam Vitals and nursing note reviewed.  Constitutional:      Appearance: He is not ill-appearing.     Comments: Tired appearing  HENT:     Head: Normocephalic and atraumatic.     Right Ear: Hearing, tympanic membrane, ear canal and external ear normal. There is no impacted cerumen.     Left  Ear: Hearing, tympanic membrane, ear canal and external ear normal. There is no impacted cerumen.     Nose: No mucosal edema.     Right Turbinates: Not enlarged or swollen.     Left Turbinates: Not enlarged or swollen.     Right Sinus: No maxillary sinus tenderness or frontal sinus tenderness.  Left Sinus: No maxillary sinus tenderness or frontal sinus tenderness.     Comments: Wearing mask    Mouth/Throat:     Mouth: Mucous membranes are moist.     Pharynx: Oropharynx is clear. No oropharyngeal exudate or posterior oropharyngeal erythema.     Comments: S/p UPPP Eyes:     Extraocular Movements: Extraocular movements intact.     Conjunctiva/sclera: Conjunctivae normal.     Pupils: Pupils are equal, round, and reactive to light.  Cardiovascular:     Rate and Rhythm: Normal rate and regular rhythm.     Pulses: Normal pulses.     Heart sounds: Normal heart sounds. No murmur heard. Pulmonary:     Effort: Pulmonary effort is normal. No respiratory distress.     Breath sounds: Rales (bibasilar R>L) present. No wheezing or rhonchi.  Abdominal:     Tenderness: There is abdominal tenderness.      Comments: Reproducible tenderness to palpation left lateral abdominal wall without mass  Musculoskeletal:     Cervical back: Normal range of motion and neck supple. No rigidity.     Right lower leg: No edema.     Left lower leg: No edema.  Lymphadenopathy:     Cervical: No cervical adenopathy.  Skin:    General: Skin is warm and dry.     Findings: No rash.  Neurological:     Mental Status: He is alert.  Psychiatric:        Mood and Affect: Mood normal.        Behavior: Behavior normal.       Results for orders placed or performed in visit on 11/07/23  POC COVID-19 BinaxNow   Collection Time: 11/07/23  1:41 PM  Result Value Ref Range   SARS Coronavirus 2 Ag Negative Negative  POCT Influenza A/B   Collection Time: 11/07/23  1:41 PM  Result Value Ref Range   Influenza A, POC Negative  Negative   Influenza B, POC Negative Negative   *Note: Due to a large number of results and/or encounters for the requested time period, some results have not been displayed. A complete set of results can be found in Results Review.   DG Chest 2 View CLINICAL DATA:  Two week history of productive cough, fatigue, and rales  EXAM: DG CHEST 2V  COMPARISON:  Chest radiograph dated 10/14/2023  FINDINGS: Normal lung volumes. No focal consolidations. No pleural effusion or pneumothorax. The heart size and mediastinal contours are within normal limits. Similar left bronchial occluder device in-situ. Cervical spinal fixation hardware appears intact. Old bilateral rib fractures. Right upper quadrant surgical clips.  IMPRESSION: No active cardiopulmonary disease.  Electronically Signed   By: Limin  Xu M.D.   On: 11/07/2023 13:41   Assessment & Plan:   Problem List Items Addressed This Visit     Cough - Primary   Vitals stable however with progressive malaise subjective fever and cough and abnormal lung exam. COVID and flu swabs negative today  CXR suspicious for increased haziness/consolidation inferior to R hilar region - will treat for CAP with augmentin  + azithromycin  antibiotic course. Also anticipate sinusitis component given prominent R frontal sinus headache component.  Tessalon  perls for cough. Update if not improving with treatment.       Relevant Orders   DG Chest 2 View (Completed)   POC COVID-19 BinaxNow (Completed)   POCT Influenza A/B (Completed)   Malaise and fatigue   Worsening over the past month, acutely worse the past 10 days  with above symptoms.        Meds ordered this encounter  Medications   benzonatate  (TESSALON ) 100 MG capsule    Sig: Take 1 capsule (100 mg total) by mouth 3 (three) times daily as needed for cough.    Dispense:  30 capsule    Refill:  0   azithromycin  (ZITHROMAX ) 250 MG tablet    Sig: Take 2 tablets on day 1, then 1 tablet daily  on days 2 through 5    Dispense:  6 tablet    Refill:  0   amoxicillin -clavulanate (AUGMENTIN ) 875-125 MG tablet    Sig: Take 1 tablet by mouth 2 (two) times daily for 10 days.    Dispense:  20 tablet    Refill:  0    Orders Placed This Encounter  Procedures   DG Chest 2 View    Standing Status:   Future    Number of Occurrences:   1    Expiration Date:   11/06/2024    Reason for Exam (SYMPTOM  OR DIAGNOSIS REQUIRED):   2 wks of productive cough, fatigue, rales bibasilarly    Preferred imaging location?:    [place]   POC COVID-19 BinaxNow   POCT Influenza A/B    Patient Instructions  We tested for COVID and flu today  Chest xray today - I want to treat you for pneumonia with 2 antibiotic sent to pharmacy - azithromycin  and augmentin  antibiotics. You may also have sinusitis.  Get plenty of rest over the next several days. Ok to continue coricidin. I have also prescribed tessalon  perls to use as needed for cough - swallow, don't chew.  Let us  know if fever >101, worsening productive cough, or not improving with treatment.   Follow up plan: Return if symptoms worsen or fail to improve.  Anton Blas, MD

## 2023-11-07 NOTE — Assessment & Plan Note (Addendum)
 Vitals stable however with progressive malaise subjective fever and cough and abnormal lung exam. COVID and flu swabs negative today  CXR suspicious for increased haziness/consolidation inferior to R hilar region - will treat for CAP with augmentin  + azithromycin  antibiotic course. Also anticipate sinusitis component given prominent R frontal sinus headache component.  Tessalon  perls for cough. Update if not improving with treatment.

## 2023-11-07 NOTE — Assessment & Plan Note (Signed)
 Worsening over the past month, acutely worse the past 10 days with above symptoms.

## 2023-11-07 NOTE — Patient Instructions (Addendum)
 We tested for COVID and flu today  Chest xray today - I want to treat you for pneumonia with 2 antibiotic sent to pharmacy - azithromycin  and augmentin  antibiotics. You may also have sinusitis.  Get plenty of rest over the next several days. Ok to continue coricidin. I have also prescribed tessalon  perls to use as needed for cough - swallow, don't chew.  Let us  know if fever >101, worsening productive cough, or not improving with treatment.

## 2023-11-10 ENCOUNTER — Other Ambulatory Visit: Payer: Self-pay | Admitting: Nurse Practitioner

## 2023-11-10 DIAGNOSIS — E1143 Type 2 diabetes mellitus with diabetic autonomic (poly)neuropathy: Secondary | ICD-10-CM

## 2023-11-12 DIAGNOSIS — R079 Chest pain, unspecified: Secondary | ICD-10-CM | POA: Diagnosis not present

## 2023-11-13 ENCOUNTER — Encounter (HOSPITAL_COMMUNITY): Payer: Self-pay | Admitting: *Deleted

## 2023-11-13 ENCOUNTER — Ambulatory Visit: Payer: Self-pay

## 2023-11-13 ENCOUNTER — Inpatient Hospital Stay (HOSPITAL_COMMUNITY)
Admission: EM | Admit: 2023-11-13 | Discharge: 2023-11-18 | DRG: 312 | Disposition: A | Discharge status: Home Health | Attending: Internal Medicine | Admitting: Internal Medicine

## 2023-11-13 ENCOUNTER — Other Ambulatory Visit: Payer: Self-pay

## 2023-11-13 ENCOUNTER — Emergency Department (HOSPITAL_COMMUNITY)

## 2023-11-13 DIAGNOSIS — I5042 Chronic combined systolic (congestive) and diastolic (congestive) heart failure: Secondary | ICD-10-CM | POA: Diagnosis present

## 2023-11-13 DIAGNOSIS — K7469 Other cirrhosis of liver: Secondary | ICD-10-CM | POA: Diagnosis present

## 2023-11-13 DIAGNOSIS — Z820 Family history of epilepsy and other diseases of the nervous system: Secondary | ICD-10-CM

## 2023-11-13 DIAGNOSIS — E1169 Type 2 diabetes mellitus with other specified complication: Secondary | ICD-10-CM | POA: Diagnosis present

## 2023-11-13 DIAGNOSIS — R079 Chest pain, unspecified: Secondary | ICD-10-CM | POA: Diagnosis not present

## 2023-11-13 DIAGNOSIS — K7460 Unspecified cirrhosis of liver: Secondary | ICD-10-CM | POA: Diagnosis not present

## 2023-11-13 DIAGNOSIS — G4733 Obstructive sleep apnea (adult) (pediatric): Secondary | ICD-10-CM | POA: Diagnosis present

## 2023-11-13 DIAGNOSIS — E039 Hypothyroidism, unspecified: Secondary | ICD-10-CM | POA: Diagnosis present

## 2023-11-13 DIAGNOSIS — I5030 Unspecified diastolic (congestive) heart failure: Secondary | ICD-10-CM

## 2023-11-13 DIAGNOSIS — Q2572 Congenital pulmonary arteriovenous malformation: Secondary | ICD-10-CM

## 2023-11-13 DIAGNOSIS — R55 Syncope and collapse: Secondary | ICD-10-CM | POA: Diagnosis not present

## 2023-11-13 DIAGNOSIS — Z79890 Hormone replacement therapy (postmenopausal): Secondary | ICD-10-CM | POA: Diagnosis not present

## 2023-11-13 DIAGNOSIS — I110 Hypertensive heart disease with heart failure: Secondary | ICD-10-CM | POA: Diagnosis present

## 2023-11-13 DIAGNOSIS — W19XXXA Unspecified fall, initial encounter: Secondary | ICD-10-CM | POA: Diagnosis present

## 2023-11-13 DIAGNOSIS — Z8249 Family history of ischemic heart disease and other diseases of the circulatory system: Secondary | ICD-10-CM

## 2023-11-13 DIAGNOSIS — E11319 Type 2 diabetes mellitus with unspecified diabetic retinopathy without macular edema: Secondary | ICD-10-CM | POA: Diagnosis present

## 2023-11-13 DIAGNOSIS — E785 Hyperlipidemia, unspecified: Secondary | ICD-10-CM | POA: Diagnosis present

## 2023-11-13 DIAGNOSIS — K7581 Nonalcoholic steatohepatitis (NASH): Secondary | ICD-10-CM | POA: Diagnosis present

## 2023-11-13 DIAGNOSIS — G8929 Other chronic pain: Secondary | ICD-10-CM | POA: Diagnosis present

## 2023-11-13 DIAGNOSIS — R531 Weakness: Secondary | ICD-10-CM | POA: Diagnosis not present

## 2023-11-13 DIAGNOSIS — Z888 Allergy status to other drugs, medicaments and biological substances status: Secondary | ICD-10-CM

## 2023-11-13 DIAGNOSIS — K7681 Hepatopulmonary syndrome: Secondary | ICD-10-CM | POA: Diagnosis present

## 2023-11-13 DIAGNOSIS — Z823 Family history of stroke: Secondary | ICD-10-CM

## 2023-11-13 DIAGNOSIS — Z9884 Bariatric surgery status: Secondary | ICD-10-CM

## 2023-11-13 DIAGNOSIS — H903 Sensorineural hearing loss, bilateral: Secondary | ICD-10-CM | POA: Diagnosis present

## 2023-11-13 DIAGNOSIS — K5900 Constipation, unspecified: Secondary | ICD-10-CM | POA: Diagnosis present

## 2023-11-13 DIAGNOSIS — Z7982 Long term (current) use of aspirin: Secondary | ICD-10-CM

## 2023-11-13 DIAGNOSIS — Z8616 Personal history of COVID-19: Secondary | ICD-10-CM

## 2023-11-13 DIAGNOSIS — K766 Portal hypertension: Secondary | ICD-10-CM | POA: Diagnosis present

## 2023-11-13 DIAGNOSIS — R161 Splenomegaly, not elsewhere classified: Secondary | ICD-10-CM | POA: Diagnosis not present

## 2023-11-13 DIAGNOSIS — I5020 Unspecified systolic (congestive) heart failure: Secondary | ICD-10-CM

## 2023-11-13 DIAGNOSIS — R7401 Elevation of levels of liver transaminase levels: Secondary | ICD-10-CM

## 2023-11-13 DIAGNOSIS — I452 Bifascicular block: Secondary | ICD-10-CM | POA: Diagnosis present

## 2023-11-13 DIAGNOSIS — R0789 Other chest pain: Secondary | ICD-10-CM | POA: Diagnosis not present

## 2023-11-13 DIAGNOSIS — K7682 Hepatic encephalopathy: Secondary | ICD-10-CM | POA: Diagnosis present

## 2023-11-13 DIAGNOSIS — I2510 Atherosclerotic heart disease of native coronary artery without angina pectoris: Secondary | ICD-10-CM | POA: Diagnosis present

## 2023-11-13 DIAGNOSIS — F419 Anxiety disorder, unspecified: Secondary | ICD-10-CM | POA: Diagnosis present

## 2023-11-13 DIAGNOSIS — J189 Pneumonia, unspecified organism: Secondary | ICD-10-CM | POA: Diagnosis not present

## 2023-11-13 DIAGNOSIS — R490 Dysphonia: Secondary | ICD-10-CM | POA: Diagnosis present

## 2023-11-13 DIAGNOSIS — R457 State of emotional shock and stress, unspecified: Secondary | ICD-10-CM | POA: Diagnosis not present

## 2023-11-13 DIAGNOSIS — Z833 Family history of diabetes mellitus: Secondary | ICD-10-CM

## 2023-11-13 DIAGNOSIS — R7989 Other specified abnormal findings of blood chemistry: Secondary | ICD-10-CM | POA: Diagnosis not present

## 2023-11-13 DIAGNOSIS — E1143 Type 2 diabetes mellitus with diabetic autonomic (poly)neuropathy: Secondary | ICD-10-CM | POA: Diagnosis present

## 2023-11-13 DIAGNOSIS — K3184 Gastroparesis: Secondary | ICD-10-CM | POA: Diagnosis present

## 2023-11-13 DIAGNOSIS — Z955 Presence of coronary angioplasty implant and graft: Secondary | ICD-10-CM

## 2023-11-13 DIAGNOSIS — Z96653 Presence of artificial knee joint, bilateral: Secondary | ICD-10-CM | POA: Diagnosis present

## 2023-11-13 DIAGNOSIS — I441 Atrioventricular block, second degree: Secondary | ICD-10-CM | POA: Diagnosis present

## 2023-11-13 DIAGNOSIS — Z7985 Long-term (current) use of injectable non-insulin antidiabetic drugs: Secondary | ICD-10-CM

## 2023-11-13 DIAGNOSIS — I10 Essential (primary) hypertension: Secondary | ICD-10-CM | POA: Diagnosis present

## 2023-11-13 DIAGNOSIS — Z981 Arthrodesis status: Secondary | ICD-10-CM

## 2023-11-13 DIAGNOSIS — E278 Other specified disorders of adrenal gland: Secondary | ICD-10-CM | POA: Diagnosis not present

## 2023-11-13 DIAGNOSIS — Z808 Family history of malignant neoplasm of other organs or systems: Secondary | ICD-10-CM

## 2023-11-13 DIAGNOSIS — Z832 Family history of diseases of the blood and blood-forming organs and certain disorders involving the immune mechanism: Secondary | ICD-10-CM

## 2023-11-13 DIAGNOSIS — Z801 Family history of malignant neoplasm of trachea, bronchus and lung: Secondary | ICD-10-CM

## 2023-11-13 DIAGNOSIS — R0602 Shortness of breath: Secondary | ICD-10-CM | POA: Diagnosis not present

## 2023-11-13 DIAGNOSIS — R627 Adult failure to thrive: Secondary | ICD-10-CM | POA: Diagnosis present

## 2023-11-13 DIAGNOSIS — R0902 Hypoxemia: Secondary | ICD-10-CM | POA: Diagnosis present

## 2023-11-13 DIAGNOSIS — Z6837 Body mass index (BMI) 37.0-37.9, adult: Secondary | ICD-10-CM

## 2023-11-13 DIAGNOSIS — Z713 Dietary counseling and surveillance: Secondary | ICD-10-CM

## 2023-11-13 DIAGNOSIS — Z961 Presence of intraocular lens: Secondary | ICD-10-CM | POA: Diagnosis present

## 2023-11-13 DIAGNOSIS — K219 Gastro-esophageal reflux disease without esophagitis: Secondary | ICD-10-CM | POA: Diagnosis present

## 2023-11-13 DIAGNOSIS — Z79899 Other long term (current) drug therapy: Secondary | ICD-10-CM

## 2023-11-13 DIAGNOSIS — R072 Precordial pain: Principal | ICD-10-CM

## 2023-11-13 DIAGNOSIS — Z860100 Personal history of colon polyps, unspecified: Secondary | ICD-10-CM

## 2023-11-13 DIAGNOSIS — Z9049 Acquired absence of other specified parts of digestive tract: Secondary | ICD-10-CM

## 2023-11-13 DIAGNOSIS — Z9842 Cataract extraction status, left eye: Secondary | ICD-10-CM

## 2023-11-13 LAB — COMPREHENSIVE METABOLIC PANEL WITH GFR
ALT: 75 U/L — ABNORMAL HIGH (ref 0–44)
AST: 96 U/L — ABNORMAL HIGH (ref 15–41)
Albumin: 4.1 g/dL (ref 3.5–5.0)
Alkaline Phosphatase: 170 U/L — ABNORMAL HIGH (ref 38–126)
Anion gap: 16 — ABNORMAL HIGH (ref 5–15)
BUN: 16 mg/dL (ref 8–23)
CO2: 19 mmol/L — ABNORMAL LOW (ref 22–32)
Calcium: 9.5 mg/dL (ref 8.9–10.3)
Chloride: 100 mmol/L (ref 98–111)
Creatinine, Ser: 1.23 mg/dL (ref 0.61–1.24)
GFR, Estimated: >60 mL/min (ref >60)
Glucose, Bld: 180 mg/dL — ABNORMAL HIGH (ref 70–99)
Potassium: 4.1 mmol/L (ref 3.5–5.1)
Sodium: 135 mmol/L (ref 135–145)
Total Bilirubin: 2.9 mg/dL — ABNORMAL HIGH (ref 0.0–1.2)
Total Protein: 6.6 g/dL (ref 6.5–8.1)

## 2023-11-13 LAB — URINALYSIS, ROUTINE W REFLEX MICROSCOPIC
Bilirubin Urine: NEGATIVE
Glucose, UA: >=500 mg/dL — AB
Hgb urine dipstick: NEGATIVE
Ketones, ur: NEGATIVE mg/dL
Leukocytes,Ua: NEGATIVE
Nitrite: NEGATIVE
Protein, ur: NEGATIVE mg/dL
Specific Gravity, Urine: 1.045 — ABNORMAL HIGH (ref 1.005–1.030)
pH: 6 (ref 5.0–8.0)

## 2023-11-13 LAB — CBC WITH DIFFERENTIAL/PLATELET
Abs Immature Granulocytes: 0.02 K/uL (ref 0.00–0.07)
Basophils Absolute: 0 K/uL (ref 0.0–0.1)
Basophils Relative: 0 %
Eosinophils Absolute: 0.1 K/uL (ref 0.0–0.5)
Eosinophils Relative: 1 %
HCT: 49.2 % (ref 39.0–52.0)
Hemoglobin: 16.7 g/dL (ref 13.0–17.0)
Immature Granulocytes: 0 %
Lymphocytes Relative: 17 %
Lymphs Abs: 1.1 K/uL (ref 0.7–4.0)
MCH: 29.6 pg (ref 26.0–34.0)
MCHC: 33.9 g/dL (ref 30.0–36.0)
MCV: 87.1 fL (ref 80.0–100.0)
Monocytes Absolute: 0.6 K/uL (ref 0.1–1.0)
Monocytes Relative: 8 %
Neutro Abs: 4.9 K/uL (ref 1.7–7.7)
Neutrophils Relative %: 74 %
Platelets: 204 K/uL (ref 150–400)
RBC: 5.65 MIL/uL (ref 4.22–5.81)
RDW: 13.2 % (ref 11.5–15.5)
WBC: 6.6 K/uL (ref 4.0–10.5)
nRBC: 0 % (ref 0.0–0.2)

## 2023-11-13 LAB — LIPASE, BLOOD: Lipase: 96 U/L — ABNORMAL HIGH (ref 11–51)

## 2023-11-13 LAB — BRAIN NATRIURETIC PEPTIDE: B Natriuretic Peptide: 28.5 pg/mL (ref 0.0–100.0)

## 2023-11-13 LAB — TROPONIN I (HIGH SENSITIVITY)
Troponin I (High Sensitivity): 5 ng/L (ref <18)
Troponin I (High Sensitivity): 7 ng/L (ref <18)

## 2023-11-13 MED ORDER — CITALOPRAM HYDROBROMIDE 20 MG PO TABS
20.0000 mg | ORAL_TABLET | Freq: Every day | ORAL | Status: DC
  Administered 2023-11-14 – 2023-11-18 (×5): 20 mg via ORAL
  Filled 2023-11-13 (×4): qty 1
  Filled 2023-11-13: qty 2

## 2023-11-13 MED ORDER — ONDANSETRON HCL 4 MG/2ML IJ SOLN
4.0000 mg | Freq: Once | INTRAMUSCULAR | Status: AC
  Administered 2023-11-13: 4 mg via INTRAVENOUS
  Filled 2023-11-13: qty 2

## 2023-11-13 MED ORDER — HYDROCODONE-ACETAMINOPHEN 5-325 MG PO TABS
1.0000 | ORAL_TABLET | Freq: Four times a day (QID) | ORAL | Status: DC | PRN
  Administered 2023-11-13 – 2023-11-16 (×5): 1 via ORAL
  Filled 2023-11-13 (×5): qty 1

## 2023-11-13 MED ORDER — METOCLOPRAMIDE HCL 5 MG PO TABS
5.0000 mg | ORAL_TABLET | Freq: Three times a day (TID) | ORAL | Status: DC
Start: 2023-11-14 — End: 2023-11-18
  Administered 2023-11-14 – 2023-11-18 (×13): 5 mg via ORAL
  Filled 2023-11-13 (×13): qty 1

## 2023-11-13 MED ORDER — MORPHINE SULFATE (PF) 4 MG/ML IV SOLN
4.0000 mg | Freq: Once | INTRAVENOUS | Status: AC
  Administered 2023-11-13: 4 mg via INTRAVENOUS
  Filled 2023-11-13: qty 1

## 2023-11-13 MED ORDER — FAMOTIDINE 20 MG PO TABS
20.0000 mg | ORAL_TABLET | Freq: Every day | ORAL | Status: DC
  Administered 2023-11-13 – 2023-11-17 (×5): 20 mg via ORAL
  Filled 2023-11-13 (×5): qty 1

## 2023-11-13 MED ORDER — ASPIRIN 81 MG PO CHEW
81.0000 mg | CHEWABLE_TABLET | Freq: Every day | ORAL | Status: DC
  Administered 2023-11-14 – 2023-11-18 (×5): 81 mg via ORAL
  Filled 2023-11-13 (×5): qty 1

## 2023-11-13 MED ORDER — LEVOTHYROXINE SODIUM 50 MCG PO TABS
137.0000 ug | ORAL_TABLET | Freq: Every day | ORAL | Status: DC
  Administered 2023-11-14 – 2023-11-18 (×5): 137 ug via ORAL
  Filled 2023-11-13 (×5): qty 1

## 2023-11-13 MED ORDER — ACETAMINOPHEN 325 MG PO TABS
650.0000 mg | ORAL_TABLET | Freq: Four times a day (QID) | ORAL | Status: DC | PRN
  Administered 2023-11-15: 650 mg via ORAL
  Filled 2023-11-13: qty 2

## 2023-11-13 MED ORDER — SODIUM CHLORIDE 0.9% FLUSH
3.0000 mL | Freq: Two times a day (BID) | INTRAVENOUS | Status: DC
  Administered 2023-11-14 – 2023-11-17 (×8): 3 mL via INTRAVENOUS

## 2023-11-13 MED ORDER — DAPAGLIFLOZIN PROPANEDIOL 10 MG PO TABS
10.0000 mg | ORAL_TABLET | Freq: Every day | ORAL | Status: DC
  Administered 2023-11-14 – 2023-11-18 (×5): 10 mg via ORAL
  Filled 2023-11-13 (×5): qty 1

## 2023-11-13 MED ORDER — ATORVASTATIN CALCIUM 10 MG PO TABS
20.0000 mg | ORAL_TABLET | Freq: Every day | ORAL | Status: DC
  Administered 2023-11-15 – 2023-11-18 (×4): 20 mg via ORAL
  Filled 2023-11-13 (×5): qty 2

## 2023-11-13 MED ORDER — PANTOPRAZOLE SODIUM 40 MG PO TBEC
40.0000 mg | DELAYED_RELEASE_TABLET | Freq: Every day | ORAL | Status: DC
Start: 2023-11-14 — End: 2023-11-15
  Administered 2023-11-14 – 2023-11-15 (×2): 40 mg via ORAL
  Filled 2023-11-13 (×2): qty 1

## 2023-11-13 MED ORDER — FERROUS SULFATE 325 (65 FE) MG PO TABS
325.0000 mg | ORAL_TABLET | ORAL | Status: DC
  Administered 2023-11-14 – 2023-11-18 (×3): 325 mg via ORAL
  Filled 2023-11-13 (×4): qty 1

## 2023-11-13 MED ORDER — BENZONATATE 100 MG PO CAPS: 100.0000 mg | ORAL_CAPSULE | Freq: Three times a day (TID) | ORAL | Status: DC | PRN

## 2023-11-13 MED ORDER — ALLOPURINOL 100 MG PO TABS
100.0000 mg | ORAL_TABLET | ORAL | Status: DC
  Administered 2023-11-13 – 2023-11-17 (×3): 100 mg via ORAL
  Filled 2023-11-13 (×3): qty 1

## 2023-11-13 MED ORDER — ONDANSETRON 4 MG PO TBDP
4.0000 mg | ORAL_TABLET | Freq: Four times a day (QID) | ORAL | Status: DC | PRN
Start: 2023-11-13 — End: 2023-11-18

## 2023-11-13 MED ORDER — NITROGLYCERIN 0.4 MG SL SUBL
0.4000 mg | SUBLINGUAL_TABLET | SUBLINGUAL | Status: DC | PRN
  Administered 2023-11-14 – 2023-11-16 (×6): 0.4 mg via SUBLINGUAL
  Filled 2023-11-13 (×4): qty 1

## 2023-11-13 MED ORDER — CARVEDILOL 3.125 MG PO TABS
3.1250 mg | ORAL_TABLET | Freq: Two times a day (BID) | ORAL | Status: DC
Start: 2023-11-14 — End: 2023-11-15
  Administered 2023-11-14 – 2023-11-15 (×3): 3.125 mg via ORAL
  Filled 2023-11-13 (×3): qty 1

## 2023-11-13 MED ORDER — ACETAMINOPHEN 650 MG RE SUPP: 650.0000 mg | Freq: Four times a day (QID) | RECTAL | Status: DC | PRN

## 2023-11-13 MED ORDER — ALBUTEROL SULFATE (2.5 MG/3ML) 0.083% IN NEBU: 2.5000 mg | INHALATION_SOLUTION | Freq: Four times a day (QID) | RESPIRATORY_TRACT | Status: DC | PRN

## 2023-11-13 MED ORDER — FENTANYL CITRATE PF 50 MCG/ML IJ SOSY
50.0000 ug | PREFILLED_SYRINGE | Freq: Once | INTRAMUSCULAR | Status: AC
  Administered 2023-11-13: 50 ug via INTRAVENOUS
  Filled 2023-11-13: qty 1

## 2023-11-13 MED ORDER — MORPHINE SULFATE (PF) 2 MG/ML IV SOLN
4.0000 mg | Freq: Once | INTRAVENOUS | Status: AC
  Administered 2023-11-13: 4 mg via INTRAVENOUS
  Filled 2023-11-13: qty 2

## 2023-11-13 MED ORDER — COLCHICINE 0.6 MG PO TABS: 0.6000 mg | ORAL_TABLET | Freq: Every day | ORAL | Status: DC | PRN

## 2023-11-13 MED ORDER — AMLODIPINE BESYLATE 5 MG PO TABS
2.5000 mg | ORAL_TABLET | Freq: Every day | ORAL | Status: DC
Start: 2023-11-14 — End: 2023-11-15
  Administered 2023-11-14 – 2023-11-15 (×2): 2.5 mg via ORAL
  Filled 2023-11-13 (×2): qty 1

## 2023-11-13 MED ORDER — PANTOPRAZOLE SODIUM 40 MG PO TBEC
40.0000 mg | DELAYED_RELEASE_TABLET | Freq: Every day | ORAL | Status: DC
Start: 2023-11-13 — End: 2023-11-13

## 2023-11-13 MED ORDER — METOLAZONE 2.5 MG PO TABS: 2.5000 mg | ORAL_TABLET | ORAL | Status: DC

## 2023-11-13 MED ORDER — ENOXAPARIN SODIUM 40 MG/0.4ML IJ SOSY
40.0000 mg | PREFILLED_SYRINGE | INTRAMUSCULAR | Status: DC
  Administered 2023-11-13 – 2023-11-17 (×5): 40 mg via SUBCUTANEOUS
  Filled 2023-11-13 (×5): qty 0.4

## 2023-11-13 MED ORDER — SPIRONOLACTONE 25 MG PO TABS
25.0000 mg | ORAL_TABLET | Freq: Every day | ORAL | Status: DC
  Administered 2023-11-14 – 2023-11-18 (×5): 25 mg via ORAL
  Filled 2023-11-13 (×5): qty 1

## 2023-11-13 MED ORDER — IOHEXOL 350 MG/ML SOLN
75.0000 mL | Freq: Once | INTRAVENOUS | Status: AC | PRN
  Administered 2023-11-13: 75 mL via INTRAVENOUS

## 2023-11-13 MED ORDER — VITAMIN B-12 100 MCG PO TABS
500.0000 ug | ORAL_TABLET | Freq: Every day | ORAL | Status: DC
Start: 2023-11-14 — End: 2023-11-18
  Administered 2023-11-14 – 2023-11-18 (×5): 500 ug via ORAL
  Filled 2023-11-13: qty 5
  Filled 2023-11-13: qty 1
  Filled 2023-11-13 (×3): qty 5

## 2023-11-13 NOTE — Telephone Encounter (Signed)
Noted. Will await ER eval.  

## 2023-11-13 NOTE — ED Notes (Signed)
Pt was unable to stand for orthostatic vitals 

## 2023-11-13 NOTE — ED Triage Notes (Signed)
 Patient presents to ed via [facility] states he was dx. With pneumonia last week has finished Z-pak and still taking antibiotics  co chest burning  and states he has sharp pain in his chest after he coughs. States he took 2 ntg at home last pm with relief, patient was given asa x 4 per ems

## 2023-11-13 NOTE — ED Provider Triage Note (Signed)
 Emergency Medicine Provider Triage Evaluation Note  Chad [name] , a 67 y.o. male  was evaluated in triage.  Pt complains of chest pain and shortness of breath.  Patient has a history of the same including congestive heart failure, diabetes.  States associated sweating.  States that EMS told him he fell when he transferred to a chair.  Review of Systems  Positive: Chest pain, shortness of breath, trouble thinking Negative: Fever  Physical Exam  BP 120/76 (BP Location: Right Arm)   Pulse (!) 53   Temp 98.3 F (36.8 C) (Oral)   Resp 20   Ht 5' 9 (1.753 m)   Wt 115.2 kg   SpO2 100%   BMI 37.51 kg/m  Gen:   Awake, seems anxious, speaking in 3 word sentences Resp:  Mild tachypnea MSK:   Moves extremities without difficulty Other:    Medical Decision Making  Medically screening exam initiated at 12:17 PM.  Appropriate orders placed.  Chad [name] was informed that the remainder of the evaluation will be completed by another provider, this initial triage assessment does not replace that evaluation, and the importance of remaining in the ED until their evaluation is complete.  Patient to be moved to a room when able, currently being monitored in EMS triage.   Chad Chew, PA-C 11/13/23 1219

## 2023-11-13 NOTE — ED Notes (Signed)
 Patient transported to CT

## 2023-11-13 NOTE — ED Provider Notes (Signed)
 [place] EMERGENCY DEPARTMENT AT [facility] Provider Note   CSN: [id number] Arrival date & time: 11/13/23  1035     Patient presents with: Chest Pain   Chad [name] is a 68 y.o. male.   68 year old male presents today for chest pain and shortness of breath.  He states this has been getting worse since yesterday.  He he was diagnosed with clinical pneumonia a week ago and started on antibiotics which he has been compliant with.  He does have history of heart failure with preserved EF.  He states since EMS arrived he has had 2 syncopal episodes.  1 as he was being loaded into the truck and the other in triage.  Denies missing any of his diuretic.  Reports diaphoresis with his chest pain episode.  The history is provided by the patient. No language interpreter was used.       Prior to Admission medications   Medication Sig Start Date End Date Taking? Authorizing Provider  allopurinol  (ZYLOPRIM ) 100 MG tablet Take 1 tablet (100 mg total) by mouth every other day. 08/15/23   Rilla Baller, MD  amLODipine  (NORVASC ) 2.5 MG tablet Take 1 tablet (2.5 mg total) by mouth daily. 08/15/23   Rilla Baller, MD  amoxicillin -clavulanate (AUGMENTIN ) 875-125 MG tablet Take 1 tablet by mouth 2 (two) times daily for 10 days. 11/07/23 11/17/23  Rilla Baller, MD  aspirin  81 MG chewable tablet Chew 81 mg by mouth daily.    [provider]  atorvastatin  (LIPITOR) 20 MG tablet Take 1 tablet (20 mg total) by mouth daily. 08/15/23   Rilla Baller, MD  benzonatate  (TESSALON ) 100 MG capsule Take 1 capsule (100 mg total) by mouth 3 (three) times daily as needed for cough. 11/07/23   Rilla Baller, MD  Blood Glucose Monitoring Suppl DEVI 1 each by Does not apply route in the morning, at noon, and at bedtime. May substitute to any manufacturer covered by patient's insurance. 08/15/23   Rilla Baller, MD  bumetanide  (BUMEX ) 1 MG tablet TAKE THREE TABLETS (3 MG TOTAL) BY MOUTH  DAILY. 08/21/23   Rilla Baller, MD  carvedilol  (COREG ) 6.25 MG tablet Take 0.5 tablets (3.125 mg total) by mouth 2 (two) times daily with a meal. 08/15/23   Rilla Baller, MD  citalopram  (CELEXA ) 20 MG tablet Take 1 tablet (20 mg total) by mouth daily. 08/15/23   Rilla Baller, MD  colchicine  0.6 MG tablet Take 1 tablet (0.6 mg total) by mouth daily as needed (gout flare). 08/15/23   Rilla Baller, MD  dapagliflozin  propanediol (FARXIGA ) 10 MG TABS tablet Take 10 mg by mouth daily before breakfast. 10/08/18   Rilla Baller, MD  dexlansoprazole  (DEXILANT ) 60 MG capsule Take 1 capsule (60 mg total) by mouth daily. 08/23/23   [place], PA-C  famotidine  (PEPCID ) 20 MG tablet Take 1 tablet (20 mg total) by mouth at bedtime. 08/23/23   [place], PA-C  ferrous sulfate  325 (65 FE) MG EC tablet Take 1 tablet (325 mg total) by mouth every other day. 08/02/22   Rilla Baller, MD  fluticasone  (FLONASE ) 50 MCG/ACT nasal spray Place 2 sprays into both nostrils daily. 08/15/23   Rilla Baller, MD  isosorbide  mononitrate (IMDUR ) 60 MG 24 hr tablet Take 1 tablet (60 mg total) by mouth daily. Patient not taking: Reported on 11/07/2023 08/15/23   Rilla Baller, MD  levothyroxine  (SYNTHROID ) 137 MCG tablet Take 1 tablet (137 mcg total) by mouth daily before breakfast. TAKE ONE TAB BY MOUTH ONCE  DAILY. TAKE ON AN EMPTY STOMACH WITH A GLASS OF WATER ATLEAST 30-60 MINUTES BEFORE BREAKFAST 08/15/23   Rilla Baller, MD  Magnesium  400 MG TABS Take 400 mg by mouth 2 (two) times daily.    [provider]  metoCLOPramide  (REGLAN ) 5 MG tablet TAKE ONE TABLET (5 MG TOTAL) BY MOUTH EVERY EIGHT HOURS AS NEEDED FOR NAUSEA. 11/10/23   [place], PA-C  metolazone  (ZAROXOLYN ) 2.5 MG tablet Take 1 tablet (2.5 mg total) by mouth once a week. As needed for fluid/swelling 06/25/21   Rilla Baller, MD  Multiple Vitamin (MULITIVITAMIN WITH MINERALS) TABS Take 1 tablet by mouth daily.    [provider]  nitroGLYCERIN  (NITROSTAT ) 0.4 MG SL tablet Place 1 tablet (0.4 mg total) under the tongue every 5 (five) minutes as needed for chest pain. 12/19/16   [place] LABOR, FNP  ondansetron  (ZOFRAN -ODT) 4 MG disintegrating tablet Take 4 mg by mouth every 6 (six) hours as needed. 03/17/23   [provider]  pantoprazole  (PROTONIX ) 40 MG tablet Take 1 tablet (40 mg total) by mouth daily. 10/15/23   Palumbo, April, MD  potassium chloride  (KLOR-CON ) 10 MEQ tablet TAKE ONE TABLET BY MOUTH TWICE A DAY 06/28/23   Rilla Baller, MD  Semaglutide , 2 MG/DOSE, (OZEMPIC , 2 MG/DOSE,) 8 MG/3ML SOPN Inject 2 mg into the skin once a week. 08/15/23   Rilla Baller, MD  Semaglutide , 2 MG/DOSE, 8 MG/3ML SOPN Inject 2 mg as directed once a week. 08/15/23   Rilla Baller, MD  spironolactone  (ALDACTONE ) 25 MG tablet Take 25 mg by mouth daily.    [provider]  VENTOLIN  HFA 108 (90 Base) MCG/ACT inhaler INHALE 2 PUFFS INTO THE LUNGS EVERY 6 HOURS AS NEEDED FOR WHEEZING OR SHORTNESS OF BREATH 05/04/23   Bedsole, Amy E, MD  vitamin B-12 (CYANOCOBALAMIN ) 500 MCG tablet Take 1 tablet (500 mcg total) by mouth daily. 03/19/20   Rilla Baller, MD  vitamin E  400 UNIT capsule Take 400 Units by mouth daily.    [provider]    Allergies: Metformin  and related    Review of Systems  Constitutional:  Positive for diaphoresis. Negative for chills and fever.  Respiratory:  Positive for shortness of breath.   Cardiovascular:  Positive for chest pain. Negative for palpitations and leg swelling.  Neurological:  Positive for syncope. Negative for light-headedness.  All other systems reviewed and are negative.   Updated Vital Signs BP (!) 147/78   Pulse 82   Temp 98.3 F (36.8 C) (Oral)   Resp 11   Ht 5' 9 (1.753 m)   Wt 115.2 kg   SpO2 100%   BMI 37.51 kg/m   Physical Exam Vitals and nursing note reviewed.  Constitutional:      General: He is not in acute distress.     Appearance: Normal appearance. He is not ill-appearing.  HENT:     Head: Normocephalic and atraumatic.     Nose: Nose normal.  Eyes:     Conjunctiva/sclera: Conjunctivae normal.  Cardiovascular:     Rate and Rhythm: Normal rate and regular rhythm.  Pulmonary:     Effort: Pulmonary effort is normal. No respiratory distress.  Musculoskeletal:        General: No deformity. Normal range of motion.     Cervical back: Normal range of motion.  Skin:    Findings: No rash.  Neurological:     Mental Status: He is alert.     (all labs ordered are listed,  but only abnormal results are displayed) Labs Reviewed  COMPREHENSIVE METABOLIC PANEL WITH GFR - Abnormal; Notable for the following components:      Result Value   CO2 19 (*)    Glucose, Bld 180 (*)    AST 96 (*)    ALT 75 (*)    Alkaline Phosphatase 170 (*)    Total Bilirubin 2.9 (*)    Anion gap 16 (*)    All other components within normal limits  CBC WITH DIFFERENTIAL/PLATELET  BRAIN NATRIURETIC PEPTIDE  TROPONIN I (HIGH SENSITIVITY)  TROPONIN I (HIGH SENSITIVITY)    EKG: EKG Interpretation Date/Time:  Monday November 13 2023 10:55:33 EDT Ventricular Rate:  66 PR Interval:  298 QRS Duration:  154 QT Interval:  450 QTC Calculation: 471 R Axis:   243  Text Interpretation: Sinus rhythm with sinus arrhythmia with 1st degree A-V block Right bundle branch block Abnormal ECG No significant change since last tracing Confirmed by Rogelia Satterfield ([id number]) on 11/13/2023 2:14:53 PM  Radiology: DG Chest 2 View Result Date: 11/13/2023 CLINICAL DATA:  Shortness of breath and chest pain a few days. Recent falls. EXAM: CHEST - 2 VIEW COMPARISON:  11/07/2023 FINDINGS: Lungs are adequately inflated and otherwise clear. Cardiomediastinal silhouette is unremarkable. Small linear metallic device projects over the posterior left hilar region unchanged. Remainder the exam is unchanged. IMPRESSION: No acute cardiopulmonary disease. Electronically  Signed   By: Toribio Agreste M.D.   On: 11/13/2023 12:30     Procedures   Medications Ordered in the ED  morphine  (PF) 2 MG/ML injection 4 mg (4 mg Intravenous Given 11/13/23 1402)  fentaNYL  (SUBLIMAZE ) injection 50 mcg (50 mcg Intravenous Given 11/13/23 1426)  ondansetron  (ZOFRAN ) injection 4 mg (4 mg Intravenous Given 11/13/23 1426)    Clinical Course as of 11/13/23 1455  Mon Nov 13, 2023  1413 High risk syncope  [LS]    Clinical Course User Index [LS] Rogelia Satterfield RAMAN, MD                                 Medical Decision Making Amount and/or Complexity of Data Reviewed Radiology: ordered.  Risk Prescription drug management.   Medical Decision Making / ED Course   This patient presents to the ED for concern of chest pain, this involves an extensive number of treatment options, and is a complaint that carries with it a high risk of complications and morbidity.  The differential diagnosis includes ACS, PE, pneumonia, GERD, dissection, MSK etiology, viral URI  MDM: 68 year old male presents with above-mentioned complaints. Conversational dyspnea noted. No pitting edema bilateral lower extremities. CBC unremarkable, CMP with preserved renal function, mildly elevated LFTs consistent with his history of cirrhosis.  Initial troponin negative.  BNP within normal.  Chest x-ray without acute cardiopulmonary process.  EKG without acute ischemic change.  Given his history of chest pain with multiple episodes during the interview,  variance in his blood pressure between the 2 upper extremities.  On the left upper extremity during my interview his blood pressure was 145/86, and his right arm which was checked twice had a systolic of 125 initially and then 117 with diastolic of 70s.  Will order a dissection study for further eval. His [place] syncope score is also elevated.  Given his multiple syncopal episodes he would benefit from admission for further syncope workup. Signed out to  oncoming provider at the end of my shift pending CT  scan.  lab Tests: -I ordered, reviewed, and interpreted labs.   The pertinent results include:   Labs Reviewed  COMPREHENSIVE METABOLIC PANEL WITH GFR - Abnormal; Notable for the following components:      Result Value   CO2 19 (*)    Glucose, Bld 180 (*)    AST 96 (*)    ALT 75 (*)    Alkaline Phosphatase 170 (*)    Total Bilirubin 2.9 (*)    Anion gap 16 (*)    All other components within normal limits  CBC WITH DIFFERENTIAL/PLATELET  BRAIN NATRIURETIC PEPTIDE  TROPONIN I (HIGH SENSITIVITY)  TROPONIN I (HIGH SENSITIVITY)      EKG  EKG Interpretation Date/Time:  Monday November 13 2023 10:55:33 EDT Ventricular Rate:  66 PR Interval:  298 QRS Duration:  154 QT Interval:  450 QTC Calculation: 471 R Axis:   243  Text Interpretation: Sinus rhythm with sinus arrhythmia with 1st degree A-V block Right bundle branch block Abnormal ECG No significant change since last tracing Confirmed by Rogelia Satterfield ([id number]) on 11/13/2023 2:14:53 PM         Imaging Studies ordered: I ordered imaging studies including chest x-ray, dissection study ordered but not resulted at the end of my shift I independently visualized and interpreted imaging. I agree with the radiologist interpretation   Medicines ordered and prescription drug management: Meds ordered this encounter  Medications   morphine  (PF) 2 MG/ML injection 4 mg   fentaNYL  (SUBLIMAZE ) injection 50 mcg   ondansetron  (ZOFRAN ) injection 4 mg    -I have reviewed the patients home medicines and have made adjustments as needed   Reevaluation: After the interventions noted above, I reevaluated the patient and found that they have :stayed the same  Co morbidities that complicate the patient evaluation  Past Medical History:  Diagnosis Date   Abnormal drug screen    innaprop negative for hydrocodone  09/2013, inapprop negative for hydrocodone  and tramadol  02/2014; inappropr  negative hydrocodone  03/2015   Acute diverticulitis 08/15/2014   Allergy    seasonal allergies   Anxiety    on meds   Arthritis    both hips and knees; got shots in each hip in August (01/25/2013)   Balanoposthitis 01/29/2015   Bone spur    L4 L5   Bulging lumbar disc    Central retinal vein occlusion with macular edema of left eye 10/17/2013   L eye 01/2017  Bulakowski - referred to retinologist 01/2017  CRVO with macular edema L eye planned treatment with Ozurdex  (dexamehthasone intravitreal implant) by Dr Jarold 02/2017   Cirrhosis (HCC)    Coronary artery disease    COVID-19 10/29/2019   10/2019 - s/p mAb infusion treatment      COVID-19 virus infection 10/29/2019   10/2019 - s/p mAb infusion treatment    Diabetes mellitus without complication (HCC)    type 2- on meds   Diastolic CHF, chronic (HCC) 04/02/2012   Diverticulosis    Gastric bypass status for obesity 1985   Gastritis 08/31/2015   with focal intestinal metaplasia   GERD (gastroesophageal reflux disease)    severe, h/o gastritis and GI bleed, per pt normal EGD [facility] 2008   Hepatic steatosis    History of diabetes mellitus 1990s   with mild background retinopathy, resolved with weight loss   HLD (hyperlipidemia)    statin caused leg cramps   HTN (hypertension)    not on meds at this time (02/05/2020)   Hyperglycemia glucose  over 300 in last 24 hrs 07/12/2016   Hyperplastic colon polyp 2008   Hypothyroid    on meds   Internal hemorrhoids    Morbid obesity (HCC)    Narrowing of lumbar spine    OSA (obstructive sleep apnea)    unable to use CPAP as of last try 2/2 h/o tracheostomy? weight loss 100lbs   Otomycosis of right ear 07/06/2011   Primary localized osteoarthritis of left knee 06/29/2016   PVC (premature ventricular contraction)    RBBB Infer axis   Right ear pain    s/p eval by ENT - thought TMJ referred pain and sent to oral surg for dental splint   Seasonal allergies    Sensorineural hearing  loss, bilateral    no longer wears hearing aides   Splenomegaly    Thrombocytopenia 06/10/2015   Platelet count dropped to 73 post op day 2 after total knee    Tinnitus    due to sensorineural hearing loss R>L with ETD   Trifascicular block  RBBB/LPFB/1AVB       Dispostion: Signed out to oncoming provider at the end of my shift.  Final diagnoses:  Precordial chest pain    ED Discharge Orders     None          Hildegard Loge, PA-C 11/13/23 1500    Rogelia Jerilynn RAMAN, MD 11/17/23 [phone]

## 2023-11-13 NOTE — ED Notes (Signed)
 Patient transported to x-ray. ?

## 2023-11-13 NOTE — ED Provider Notes (Signed)
  Physical Exam  BP (!) 143/76   Pulse 78   Temp 97.9 F (36.6 C) (Oral)   Resp 17   Ht 5' 9 (1.753 m)   Wt 115.2 kg   SpO2 100%   BMI 37.51 kg/m   Physical Exam Constitutional:      General: He is not in acute distress.    Appearance: He is well-developed.  HENT:     Head: Normocephalic and atraumatic.  Eyes:     Extraocular Movements: Extraocular movements intact.     Conjunctiva/sclera: Conjunctivae normal.  Cardiovascular:     Rate and Rhythm: Normal rate and regular rhythm.  Pulmonary:     Effort: Tachypnea present. No respiratory distress.  Musculoskeletal:     Cervical back: Normal range of motion.  Neurological:     General: No focal deficit present.     Mental Status: He is alert and oriented to person, place, and time. Mental status is at baseline.     Sensory: No sensory deficit.     Procedures  Procedures  ED Course / MDM   Clinical Course as of 11/13/23 1918  Mon Nov 13, 2023  1413 High risk syncope  [LS]  1512 Pending dissection study. Chest pain. Clinically diagnosed with pneumonia 1 week ago. 2 episodes of syncope today with chest pain, SOB, and diaphoresis. Likely admit with high risk syncope. Did have varied BP in UE. PE study 1 month ago negative. Hx of cirrhosis.  [CB]  1726 Dr. Sim with hospitalist who admitted the patient.  Pending response from cardiology. [CB]  1902 Spoke with cardiology, Dr. Jeffrie who noted that he had just had a sense of cardiac workup approximately 6 months ago and did not see any need for further cardiac workup at this time.  However recommended that they reach back out if noticing any ECG abnormalities that are new. [CB]    Clinical Course User Index [CB] Beola Terrall RAMAN, PA-C [LS] Rogelia Jerilynn RAMAN, MD   Medical Decision Making Amount and/or Complexity of Data Reviewed Labs: ordered. Radiology: ordered.  Risk Prescription drug management. Decision regarding hospitalization.   Patient care transferred to  Dr. Sim and patient was admitted.       Beola Terrall RAMAN, [place] 11/13/23 1918    Pamella Ozell LABOR, DO 11/19/23 [phone]

## 2023-11-13 NOTE — ED Triage Notes (Signed)
 Patient is able to speak in cocmplete semtences
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
## 2023-11-13 NOTE — Telephone Encounter (Signed)
 Pt speaking in 2-3 word sentences. Pt states that he was recently dx with pneumonia. States that he has been having chills and SOB. States I cannot think straight.  Pt advised to call 911 and go to the ED. Pt agreeable.   Copied from CRM #[id number]. Topic: Clinical - Red Word Triage >> Nov 13, 2023  8:56 AM Chad [name] wrote: Red Word that prompted transfer to Nurse Triage: weak and coughing a lot and sob. Reason for Disposition . SEVERE difficulty breathing (e.g., struggling for each breath, speaks in single words)  Protocols used: Breathing Difficulty-A-AH

## 2023-11-14 ENCOUNTER — Observation Stay (HOSPITAL_COMMUNITY)

## 2023-11-14 ENCOUNTER — Encounter (HOSPITAL_COMMUNITY): Payer: Self-pay | Admitting: Internal Medicine

## 2023-11-14 DIAGNOSIS — E1143 Type 2 diabetes mellitus with diabetic autonomic (poly)neuropathy: Secondary | ICD-10-CM | POA: Diagnosis not present

## 2023-11-14 DIAGNOSIS — K5900 Constipation, unspecified: Secondary | ICD-10-CM

## 2023-11-14 DIAGNOSIS — R55 Syncope and collapse: Secondary | ICD-10-CM

## 2023-11-14 DIAGNOSIS — R072 Precordial pain: Secondary | ICD-10-CM | POA: Diagnosis not present

## 2023-11-14 DIAGNOSIS — K7460 Unspecified cirrhosis of liver: Secondary | ICD-10-CM | POA: Diagnosis not present

## 2023-11-14 DIAGNOSIS — K766 Portal hypertension: Secondary | ICD-10-CM | POA: Diagnosis not present

## 2023-11-14 LAB — ECHOCARDIOGRAM COMPLETE
AR max vel: 2.82 cm2
AV Peak grad: 4.7 mmHg
Ao pk vel: 1.08 m/s
Area-P 1/2: 3.42 cm2
Height: 69 in
S' Lateral: 2.5 cm
Weight: 4095.26 [oz_av]

## 2023-11-14 LAB — COMPREHENSIVE METABOLIC PANEL WITH GFR
ALT: 78 U/L — ABNORMAL HIGH (ref 0–44)
AST: 97 U/L — ABNORMAL HIGH (ref 15–41)
Albumin: 3.6 g/dL (ref 3.5–5.0)
Alkaline Phosphatase: 165 U/L — ABNORMAL HIGH (ref 38–126)
Anion gap: 13 (ref 5–15)
BUN: 19 mg/dL (ref 8–23)
CO2: 25 mmol/L (ref 22–32)
Calcium: 8.9 mg/dL (ref 8.9–10.3)
Chloride: 98 mmol/L (ref 98–111)
Creatinine, Ser: 1.3 mg/dL — ABNORMAL HIGH (ref 0.61–1.24)
GFR, Estimated: 60 mL/min — ABNORMAL LOW (ref >60)
Glucose, Bld: 154 mg/dL — ABNORMAL HIGH (ref 70–99)
Potassium: 3.4 mmol/L — ABNORMAL LOW (ref 3.5–5.1)
Sodium: 136 mmol/L (ref 135–145)
Total Bilirubin: 2.9 mg/dL — ABNORMAL HIGH (ref 0.0–1.2)
Total Protein: 6.2 g/dL — ABNORMAL LOW (ref 6.5–8.1)

## 2023-11-14 LAB — TROPONIN I (HIGH SENSITIVITY)
Troponin I (High Sensitivity): 7 ng/L (ref <18)
Troponin I (High Sensitivity): 8 ng/L (ref <18)

## 2023-11-14 LAB — CBC
HCT: 46.3 % (ref 39.0–52.0)
Hemoglobin: 15.6 g/dL (ref 13.0–17.0)
MCH: 29.8 pg (ref 26.0–34.0)
MCHC: 33.7 g/dL (ref 30.0–36.0)
MCV: 88.4 fL (ref 80.0–100.0)
Platelets: 163 K/uL (ref 150–400)
RBC: 5.24 MIL/uL (ref 4.22–5.81)
RDW: 13.4 % (ref 11.5–15.5)
WBC: 6.8 K/uL (ref 4.0–10.5)
nRBC: 0 % (ref 0.0–0.2)

## 2023-11-14 LAB — PROTIME-INR
INR: 1.2 (ref 0.8–1.2)
Prothrombin Time: 15.4 s — ABNORMAL HIGH (ref 11.4–15.2)

## 2023-11-14 LAB — CBG MONITORING, ED: Glucose-Capillary: 164 mg/dL — ABNORMAL HIGH (ref 70–99)

## 2023-11-14 MED ORDER — ALUM & MAG HYDROXIDE-SIMETH 200-200-20 MG/5ML PO SUSP
30.0000 mL | Freq: Once | ORAL | Status: AC
  Administered 2023-11-14: 30 mL via ORAL
  Filled 2023-11-14: qty 30

## 2023-11-14 MED ORDER — LACTULOSE 10 GM/15ML PO SOLN
30.0000 g | Freq: Two times a day (BID) | ORAL | Status: AC
  Administered 2023-11-15: 30 g via ORAL
  Filled 2023-11-14 (×2): qty 45

## 2023-11-14 MED ORDER — PERFLUTREN LIPID MICROSPHERE
1.0000 mL | INTRAVENOUS | Status: AC | PRN
  Administered 2023-11-14: 2 mL via INTRAVENOUS

## 2023-11-14 NOTE — Progress Notes (Signed)
 Patient arrived to room 6E05. VS stable and patient free from pain. Patient oriented to room and call bell in reach. Family aware of admission. Patient has clothes, slides, watch, ring and cell phone at bedside.

## 2023-11-14 NOTE — Care Management Obs Status (Signed)
 MEDICARE OBSERVATION STATUS NOTIFICATION   Patient Details  Name: Chad [name] MRN: [id number] Date of Birth: 1955-11-02   Medicare Observation Status Notification Given:  Yes    Sudie Erminio Deems, RN 11/14/2023, 4:17 PM

## 2023-11-14 NOTE — Consult Note (Signed)
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 CTA chest 10/15/2023. MRI abdomen 02/14/2023. CT abdomen and pelvis 10/10/2019 FINDINGS: CTA CHEST FINDINGS Cardiovascular: Unenhanced images of the chest demonstrate scattered calcification throughout the aorta and moderate calcification of the coronary arteries. Possible coronary stents. Calcification in the aortic valve. No evidence of acute intramural hematoma. Images obtained during the arterial phase after intravenous contrast material administration demonstrate normal caliber thoracic aorta. No aortic aneurysm or dissection. Great vessel origins are patent. Central pulmonary arteries are well opacified without evidence of acute pulmonary embolus. Normal heart size. No pericardial effusions. Mediastinum/Nodes: No enlarged mediastinal, hilar, or axillary lymph nodes. Thyroid  gland, trachea, and esophagus demonstrate no significant findings. Lungs/Pleura: Lungs are clear. No pleural effusion or pneumothorax.  Musculoskeletal: Degenerative changes in the thoracic spine. Postoperative changes in the lower cervical spine. Old rib fractures. No acute bony abnormalities. Review of the MIP images confirms the above findings. CTA ABDOMEN AND PELVIS FINDINGS VASCULAR Aorta: Normal caliber aorta without aneurysm, dissection, vasculitis or significant stenosis. Scattered aortic calcification. Celiac: Patent without evidence of aneurysm, dissection, vasculitis or significant stenosis. SMA: Patent without evidence of aneurysm, dissection, vasculitis or significant stenosis. Renals: Duplicated right and triplicated left renal arteries are somewhat diminutive but appear patent bilaterally. Nephrograms are symmetrical. IMA: Patent without evidence of aneurysm, dissection, vasculitis or significant stenosis. Inflow: Patent without evidence of aneurysm, dissection, vasculitis or significant stenosis. Veins: No obvious venous abnormality within the limitations of this arterial phase study. Review of the MIP images confirms the above findings. NON-VASCULAR Hepatobiliary: Hepatic cirrhosis with diffuse nodular contour enlargement of the lateral segment left and  caudate lobes. No focal lesions are identified. Surgical absence of the gallbladder. Bile ducts are normal. Pancreas: Unremarkable. No pancreatic ductal dilatation or surrounding inflammatory changes. Spleen: Spleen is enlarged.  No focal lesions. Adrenals/Urinary Tract: 2.3 cm diameter right adrenal gland nodule containing macroscopic fat, unchanged since previous studies. This is consistent with a myelolipoma. No imaging follow-up is indicated. Kidneys are symmetrical. No focal solid lesions. No hydronephrosis or hydroureter. Bladder is normal. Stomach/Bowel: Stomach, small bowel, and colon are not abnormally distended. No wall thickening or inflammatory stranding identified. Postoperative changes in the stomach. Appendix is not identified. Lymphatic: No significant  lymphadenopathy. Reproductive: Prostate is unremarkable. Other: No abdominal wall hernia or abnormality. No abdominopelvic ascites. Musculoskeletal: Degenerative changes in the spine. No acute bony abnormalities. Review of the MIP images confirms the above findings. IMPRESSION: 1. Scattered calcification throughout the thoracoabdominal aorta consistent with aortic atherosclerosis. No aneurysm or dissection identified. 2. No large vessel occlusion. No evidence of significant pulmonary embolus. 3. No evidence of active pulmonary disease. 4. Hepatic cirrhosis with splenic enlargement. 5. Benign-appearing lesion in the right adrenal gland consistent with myelolipoma, unchanged. No imaging follow-up is indicated. Electronically Signed   By: Elsie Gravely M.D.   On: 11/13/2023 16:22   DG Chest 2 View Result Date: 11/13/2023 CLINICAL DATA:  Shortness of breath and chest pain a few days. Recent falls. EXAM: CHEST - 2 VIEW COMPARISON:  11/07/2023 FINDINGS: Lungs are adequately inflated and otherwise clear. Cardiomediastinal silhouette is unremarkable. Small linear metallic device projects over the posterior left hilar region unchanged. Remainder the exam is unchanged. IMPRESSION: No acute cardiopulmonary disease. Electronically Signed   By: Toribio Agreste M.D.   On: 11/13/2023 12:30   IMPRESSION:  68 year old male who presented with complaints of chest pain, SOB, dizzy, and syncope at home.  Says that overall he has felt very fatigued over the past 6 months to a year but these other symptoms/complaints are more acute.  MASH cirrhosis/portal hypertension/splenomegaly --his liver disease has remained compensated for the most part with just a slight increase in his bili from previous.  GERD/diabetic gastroparesis --stable on home medication. --Dexilant  60 mg each morning --Famotidine  20 mg at bedtime --Metoclopramide  5 to 10 mg 3 times daily before meals as needed.  Constipation:  Reports having some issues  with moving his bowels recently but started Metamucil and that has helped some.  PLAN: -Will get an INR so we can update his MELD.  Total bili is up slightly compared to couple of months ago, but overall LFTs not significantly worse than previous. -Home medication regimen for his GERD/gastroparesis and treat constipation here if needed. -Follow-up echo and cardiology input.  Doubt GI etiology of acute symptoms.  Chad [name]. Sharod Petsch  11/14/2023, 1:12 PM

## 2023-11-14 NOTE — Evaluation (Signed)
 Occupational Therapy Evaluation Patient Details Name: Chad [name] MRN: [id number] DOB: 08/16/1955 Today's Date: 11/14/2023   History of Present Illness   Chad [name] is a 68 y.o. male who presented to the ER with progressive shortness of breath and chest pain.  Patient also reported syncopal episode at home. PHMx: diastolic dysfunction CHF, morbid obesity, GERD, DM2, diabetic retinopathy, HTN, hyperlipidemia, hypothyroidism, CAD , obstructive sleep apnea, anxiety, OA (hips and knees-Bil TKA), left eye retinal vein occulsion, hypothyroid, tinnitus, C3-4 fusion, left shoulder sx.     Clinical Impressions This 68 yo male admitted with above presents to acute OT with PLOF of reporting he was independent with his basic ADLs and did some minimal IADLs but mainly is so tired all the time all he wants to do is sleep (I don't really have a life). He currently is limited to bed level ADLs due to his dizziness with position changes from supine>sit>stand. He reports not appetite as well. He will continue to benefit from acute OT with follow up from continued inpatient follow up therapy, <3 hours/day unless his dizziness can be treated and he can get up and do more without LOB/passing out then [facility] would be appropriate. Wife is currently in hospital with a femur fracture per pt.      If plan is discharge home, recommend the following:   A lot of help with walking and/or transfers;A lot of help with bathing/dressing/bathroom;Help with stairs or ramp for entrance;Assistance with cooking/housework;Assist for transportation     Functional Status Assessment   Patient has had a recent decline in their functional status and demonstrates the ability to make significant improvements in function in a reasonable and predictable amount of time.     Equipment Recommendations   None recommended by OT      Precautions/Restrictions   Precautions Precautions: Fall Precaution/Restrictions Comments:  dizziness with change in position; syncopal episodes PTA (past 2 months per pt, but getting worse) Restrictions Weight Bearing Restrictions Per Provider Order: No     Mobility Bed Mobility Overal bed mobility: Needs Assistance Bed Mobility: Supine to Sit, Sit to Supine     Supine to sit: Supervision, HOB elevated Sit to supine: Supervision   General bed mobility comments: for supine to sit pt was reaching out to me due to dizziness,no nystagmus noted    Transfers Overall transfer level: Needs assistance   Transfers: Sit to/from Stand Sit to Stand: Min assist           General transfer comment: holding onto back of chair in room for stability      Balance Overall balance assessment: Needs assistance Sitting-balance support: Single extremity supported, Feet supported Sitting balance-Leahy Scale: Poor Sitting balance - Comments: feels off balance--dizzy   Standing balance support: Bilateral upper extremity supported Standing balance-Leahy Scale: Poor                             ADL either performed or assessed with clinical judgement   ADL Overall ADL's : Needs assistance/impaired Eating/Feeding: Independent;Bed level   Grooming: Set up;Bed level   Upper Body Bathing: Set up;Bed level   Lower Body Bathing: Moderate assistance;Bed level   Upper Body Dressing : Set up;Bed level   Lower Body Dressing: Maximal assistance;Bed level     Toilet Transfer Details (indicate cue type and reason): unable to attempt today due to dizziness upon sitting up and upon standing  Vision Patient Visual Report: No change from baseline              Pertinent Vitals/Pain Pain Assessment Pain Assessment: 0-10 Pain Score: 7  Pain Location: mid to right of sternum Pain Descriptors / Indicators: Constant, Dull Pain Intervention(s): Limited activity within patient's tolerance, Monitored during session, Repositioned     Extremity/Trunk  Assessment Upper Extremity Assessment Upper Extremity Assessment: Overall WFL for tasks assessed           Communication Communication Factors Affecting Communication: Hearing impaired   Cognition Arousal: Alert Behavior During Therapy: WFL for tasks assessed/performed Cognition: No apparent impairments                               Following commands: Intact                  Home Living Family/patient expects to be discharged to:: Private residence Living Arrangements: Spouse/significant other;Children Available Help at Discharge: Family;Available 24 hours/day Type of Home: Mobile home Home Access: Ramped entrance     Home Layout: One level     Bathroom Shower/Tub: Walk-in Pensions consultant: Handicapped height     Home Equipment: Hand held shower head;Grab bars - tub/shower;BSC/3in1;Tub bench;Rollator (4 wheels)   Additional Comments: over the past year reports no energy, just wants to sleep alot and no appetite, has lost 110 pounds over the last 2 years (has been on Ozempic ). His dtr and her husband as well as 2 grandchildren live with them)--dtr does not work out of the house--she takes care of the 68 yo.      Prior Functioning/Environment Prior Level of Function : Independent/Modified Independent;Driving                    OT Problem List: Decreased activity tolerance;Impaired balance (sitting and/or standing)   OT Treatment/Interventions: Self-care/ADL training;Balance training;DME and/or AE instruction;Patient/family education      OT Goals(Current goals can be found in the care plan section)   Acute Rehab OT Goals Patient Stated Goal: To find out why I don't want to eat, don't have any energy, always want to sleep, and keep passing out/getting dizzy OT Goal Formulation: With patient Time For Goal Achievement: 11/29/23 Potential to Achieve Goals: Good   OT Frequency:  Min 2X/week       AM-PAC OT 6 Clicks  Daily Activity     Outcome Measure Help from another person eating meals?: None Help from another person taking care of personal grooming?: A Little Help from another person toileting, which includes using toliet, bedpan, or urinal?: A Lot Help from another person bathing (including washing, rinsing, drying)?: A Lot Help from another person to put on and taking off regular upper body clothing?: A Little Help from another person to put on and taking off regular lower body clothing?: A Lot 6 Click Score: 16   End of Session Nurse Communication: Mobility status (pt reports pain in right and mid chest when standing (dull, constant) and one sharp pain on the left side)  Activity Tolerance:  (limited by dizziness) Patient left: in bed;with call bell/phone within reach  OT Visit Diagnosis: Unsteadiness on feet (R26.81);Other abnormalities of gait and mobility (R26.89);Repeated falls (R29.6);History of falling (Z91.81);Pain Pain - part of body:  (right, left and mid chest upon standing)                Time: 9056-8987 OT Time Calculation (  min): 29 min Charges:  OT General Charges $OT Visit: 1 Visit OT Evaluation $OT Eval Moderate Complexity: 1 Mod OT Treatments $Self Care/Home Management : 8-22 mins  Donny BECKER OT Acute Rehabilitation Services Office [phone] '  [place] Dorothyann Distel 11/14/2023, 10:42 AM

## 2023-11-14 NOTE — Progress Notes (Signed)
 PROGRESS NOTE    Chad [name]  FMW:[id number] DOB: 16-Feb-1956 DOA: 11/13/2023 PCP: Rilla Baller, MD   Brief Narrative:    Assessment & Plan:   Principal Problem:   Syncope and collapse Active Problems:   Diabetes mellitus type 2 with retinopathy (HCC)   HTN (hypertension)   Hyperlipidemia associated with type 2 diabetes mellitus (HCC)   GERD (gastroesophageal reflux disease)   Hypothyroidism   OSA (obstructive sleep apnea)   Severe obesity (BMI 35.0-39.9) with comorbidity (HCC)   CAD (coronary artery disease), native coronary artery   Chad [name] is a 68 y.o. male with medical history significant of diastolic dysfunction CHF, morbid obesity, GERD, type 2 diabetes, diabetic retinopathy, hypertension, hyperlipidemia, hypothyroidism, coronary artery disease status post stent, obstructive sleep apnea, who presents to the ER with progressive shortness of breath and chest pain.  Patient reported generalized weakness, excessive sleepiness and fatigue for the past several days.  Stated he is scheduled to receive antibiotic therapy recently.  He apparently had syncopal episode at home.  He is admitted for further observation and evaluation.  In the ER he had an episode of chest pain while getting up to the bathroom and this relieved with nitrox2  He also had another episode of pain when up with physical therapy.  He  reports pain in the center of the chest as well as in the left rib cage area.  Reports of nausea with history of gastroparesis.  He takes Reglan  at home.  He appears anxious.  Assessment and plan:  Assessment and Plan:   #1 syncopal episodes:   - Etiology unclear, could be orthostatic.  His vital signs in the ER were stable with slight drop in blood pressure when sitting up from laying position but higher blood pressure and standing position.  He does have significant cardiac history as mentioned above.  EKG shows sinus rhythm, right bundle branch block similar to  before.  Troponin x 2 negative.  Will continue to monitor in telemetry  # Chest pain: Relieved with nitroglycerin  but the pain reappears.  Localized center of the chest as well as some left rib cage pain.  Troponin x 2 negative.  EKG with normal sinus rhythm, no ST-T wave changes.  Given history of CAD with a stent in the past follow-up cardiology we will obtain cardiology consult.  Will trial GI cocktail and continue pantoprazole  given significant history of gastroparesis/GERD.  Consult GI as well. Monitor in telemetry Echocardiogram   #2 GERD: Continue pantoprazole .  Will give GI cocktail to see if that helps.  Will consult GI as well   #3 coronary artery disease: Most recent angiogram in May 2025.  LAD stent was patent will await cardiology recommendations.  Continue home medications.  Aspirin , statin, Coreg .  # Elevated liver enzymes/MASH cirrhosis AST/ALT, total bili higher than baseline.  CT abdomen shows no biliary pathology.  He has a history of MASH cirrhosis.  Will avoid hepatotoxins.  #4 obstructive sleep apnea: CPAP    #5 essential hypertension: Continue amlodipine , Coreg    #6 diabetes type 2: Continue with sliding scale insulin    #7 hypothyroidism: Resume levothyroxine    #8 morbid obesity: Dietary counseling   #9 generalized weakness: Physical deconditioning, will have PT and OT.  Continue supportive care      Advance Care Planning:   Code Status: Full Code    Consults: PT and OT consulted pneumonia   Family Communication: No family at bedside.   Severity of Illness: The appropriate patient  status for this patient is OBSERVATION. Observation status is judged to be reasonable and necessary in order to provide the required intensity of service to ensure the patient's safety. The patient's presenting symptoms, physical exam findings, and initial radiographic and laboratory data in the context of their medical condition is felt to place them at decreased risk for further  clinical deterioration. Furthermore, it is anticipated that the patient will be medically stable for discharge from the hospital within 2 midnights of admission.    DVT prophylaxis: Lovenox  Code Status: Full code Family Communication: No family at the bedside Disposition Plan: Pending PT eval  Consultants: Cardiology, GI  Procedures:   Antimicrobials:    Subjective:  Patient seen and examined at the bedside.  He appears anxious.  He reports of chest pain when walking to the bathroom today.  Also had another episode with PT.  The first episode resolved with 2 doses of nitro but this reappeared when he started to get up with PT.  Felt nauseous.  Objective: Vitals:   11/14/23 1136 11/14/23 1200 11/14/23 1300 11/14/23 1343  BP:  111/64 115/68 131/68  Pulse:  78 81 80  Resp:  16 18 18   Temp: 98.3 F (36.8 C)   98 F (36.7 C)  TempSrc: Oral   Oral  SpO2:  95% 99% 99%  Weight:    116.1 kg  Height:       No intake or output data in the 24 hours ending 11/14/23 1418 Filed Weights   11/13/23 1110 11/14/23 1343  Weight: 115.2 kg 116.1 kg    Examination:  General: Alert, oriented, anxious, not in distress Chest: Clear bilaterally, diminished at bases CVs: S1, S2, no murmur, regular rhythm Abdomen: Obese, nontender  Data Reviewed: I have personally reviewed following labs and imaging studies  CBC: Recent Labs  Lab 11/13/23 1104 11/14/23 0359  WBC 6.6 6.8  NEUTROABS 4.9  --   HGB 16.7 15.6  HCT 49.2 46.3  MCV 87.1 88.4  PLT 204 163   Basic Metabolic Panel: Recent Labs  Lab 11/13/23 1104 11/14/23 0359  NA 135 136  K 4.1 3.4*  CL 100 98  CO2 19* 25  GLUCOSE 180* 154*  BUN 16 19  CREATININE 1.23 1.30*  CALCIUM  9.5 8.9   GFR: Estimated Creatinine Clearance: 68.4 mL/min (A) (by C-G formula based on SCr of 1.3 mg/dL (H)). Liver Function Tests: Recent Labs  Lab 11/13/23 1104 11/14/23 0359  AST 96* 97*  ALT 75* 78*  ALKPHOS 170* 165*  BILITOT 2.9* 2.9*   PROT 6.6 6.2*  ALBUMIN 4.1 3.6   Recent Labs  Lab 11/13/23 1404  LIPASE 96*   No results for input(s): AMMONIA in the last 168 hours. Coagulation Profile: No results for input(s): INR, PROTIME in the last 168 hours. Cardiac Enzymes: No results for input(s): CKTOTAL, CKMB, CKMBINDEX, TROPONINI in the last 168 hours. BNP (last 3 results) No results for input(s): PROBNP in the last 8760 hours. HbA1C: No results for input(s): HGBA1C in the last 72 hours. CBG: Recent Labs  Lab 11/14/23 0726  GLUCAP 164*   Lipid Profile: No results for input(s): CHOL, HDL, LDLCALC, TRIG, CHOLHDL, LDLDIRECT in the last 72 hours. Thyroid  Function Tests: No results for input(s): TSH, T4TOTAL, FREET4, T3FREE, THYROIDAB in the last 72 hours. Anemia Panel: No results for input(s): VITAMINB12, FOLATE, FERRITIN, TIBC, IRON, RETICCTPCT in the last 72 hours. Sepsis Labs: No results for input(s): PROCALCITON, LATICACIDVEN in the last 168 hours.  No results  found for this or any previous visit (from the past 240 hours).       Radiology Studies: CT Angio Chest/Abd/Pel for Dissection W and/or Wo Contrast Result Date: 11/13/2023 CLINICAL DATA:  Acute aortic syndrome suspected. Shortness of breath and chest pain for a few days. Recent falls. EXAM: CT ANGIOGRAPHY CHEST, ABDOMEN AND PELVIS TECHNIQUE: Noncontrast images were initially obtained to the chest. Multidetector CT imaging through the chest, abdomen and pelvis was performed using the standard protocol during bolus administration of intravenous contrast. Multiplanar reconstructed images and MIPs were obtained and reviewed to evaluate the vascular anatomy. RADIATION DOSE REDUCTION: This exam was performed according to the departmental dose-optimization program which includes automated exposure control, adjustment of the mA and/or kV according to patient size and/or use of iterative reconstruction  technique. CONTRAST:  75mL OMNIPAQUE  IOHEXOL  350 MG/ML SOLN COMPARISON:  Chest radiograph 11/13/2023. CTA chest 10/15/2023. MRI abdomen 02/14/2023. CT abdomen and pelvis 10/10/2019 FINDINGS: CTA CHEST FINDINGS Cardiovascular: Unenhanced images of the chest demonstrate scattered calcification throughout the aorta and moderate calcification of the coronary arteries. Possible coronary stents. Calcification in the aortic valve. No evidence of acute intramural hematoma. Images obtained during the arterial phase after intravenous contrast material administration demonstrate normal caliber thoracic aorta. No aortic aneurysm or dissection. Great vessel origins are patent. Central pulmonary arteries are well opacified without evidence of acute pulmonary embolus. Normal heart size. No pericardial effusions. Mediastinum/Nodes: No enlarged mediastinal, hilar, or axillary lymph nodes. Thyroid  gland, trachea, and esophagus demonstrate no significant findings. Lungs/Pleura: Lungs are clear. No pleural effusion or pneumothorax. Musculoskeletal: Degenerative changes in the thoracic spine. Postoperative changes in the lower cervical spine. Old rib fractures. No acute bony abnormalities. Review of the MIP images confirms the above findings. CTA ABDOMEN AND PELVIS FINDINGS VASCULAR Aorta: Normal caliber aorta without aneurysm, dissection, vasculitis or significant stenosis. Scattered aortic calcification. Celiac: Patent without evidence of aneurysm, dissection, vasculitis or significant stenosis. SMA: Patent without evidence of aneurysm, dissection, vasculitis or significant stenosis. Renals: Duplicated right and triplicated left renal arteries are somewhat diminutive but appear patent bilaterally. Nephrograms are symmetrical. IMA: Patent without evidence of aneurysm, dissection, vasculitis or significant stenosis. Inflow: Patent without evidence of aneurysm, dissection, vasculitis or significant stenosis. Veins: No obvious venous  abnormality within the limitations of this arterial phase study. Review of the MIP images confirms the above findings. NON-VASCULAR Hepatobiliary: Hepatic cirrhosis with diffuse nodular contour enlargement of the lateral segment left and caudate lobes. No focal lesions are identified. Surgical absence of the gallbladder. Bile ducts are normal. Pancreas: Unremarkable. No pancreatic ductal dilatation or surrounding inflammatory changes. Spleen: Spleen is enlarged.  No focal lesions. Adrenals/Urinary Tract: 2.3 cm diameter right adrenal gland nodule containing macroscopic fat, unchanged since previous studies. This is consistent with a myelolipoma. No imaging follow-up is indicated. Kidneys are symmetrical. No focal solid lesions. No hydronephrosis or hydroureter. Bladder is normal. Stomach/Bowel: Stomach, small bowel, and colon are not abnormally distended. No wall thickening or inflammatory stranding identified. Postoperative changes in the stomach. Appendix is not identified. Lymphatic: No significant lymphadenopathy. Reproductive: Prostate is unremarkable. Other: No abdominal wall hernia or abnormality. No abdominopelvic ascites. Musculoskeletal: Degenerative changes in the spine. No acute bony abnormalities. Review of the MIP images confirms the above findings. IMPRESSION: 1. Scattered calcification throughout the thoracoabdominal aorta consistent with aortic atherosclerosis. No aneurysm or dissection identified. 2. No large vessel occlusion. No evidence of significant pulmonary embolus. 3. No evidence of active pulmonary disease. 4. Hepatic cirrhosis with splenic enlargement. 5. Benign-appearing  lesion in the right adrenal gland consistent with myelolipoma, unchanged. No imaging follow-up is indicated. Electronically Signed   By: Elsie Gravely M.D.   On: 11/13/2023 16:22   DG Chest 2 View Result Date: 11/13/2023 CLINICAL DATA:  Shortness of breath and chest pain a few days. Recent falls. EXAM: CHEST - 2  VIEW COMPARISON:  11/07/2023 FINDINGS: Lungs are adequately inflated and otherwise clear. Cardiomediastinal silhouette is unremarkable. Small linear metallic device projects over the posterior left hilar region unchanged. Remainder the exam is unchanged. IMPRESSION: No acute cardiopulmonary disease. Electronically Signed   By: Toribio Agreste M.D.   On: 11/13/2023 12:30        Scheduled Meds:  allopurinol   100 mg Oral QODAY   amLODipine   2.5 mg Oral Daily   aspirin   81 mg Oral Daily   atorvastatin   20 mg Oral Daily   carvedilol   3.125 mg Oral BID WC   citalopram   20 mg Oral Daily   dapagliflozin  propanediol  10 mg Oral QAC breakfast   enoxaparin  (LOVENOX ) injection  40 mg Subcutaneous Q24H   famotidine   20 mg Oral QHS   ferrous sulfate   325 mg Oral Q48H   levothyroxine   137 mcg Oral QAC breakfast   metoCLOPramide   5 mg Oral TID AC   pantoprazole   40 mg Oral Daily   sodium chloride  flush  3 mL Intravenous Q12H   spironolactone   25 mg Oral Daily   cyanocobalamin   500 mcg Oral Daily   Continuous Infusions:        Heena Woodbury, MD [facility] 11/14/2023, 2:18 PM

## 2023-11-14 NOTE — TOC CM/SW Note (Signed)
 Transition of Care [facility]) - Inpatient Brief Assessment   Patient Details  Name: Chad [name] MRN: [id number] Date of Birth: 1955/10/15  Transition of [facility]) CM/SW Contact:    Sudie Erminio Deems, RN Phone Number: 11/14/2023, 4:23 PM   Clinical Narrative: Patient presented for chest pain and generalized weakness. PTA patient was independent from home with spouse, daughter and son-in-law. Patient reports that his spouse is hospitalized post a fall and fracture. Patient has insurance and PCP. Inpatient Case Manager will continue to follow for disposition needs as the patient progresses.    Transition of Care Asessment: Insurance and Status: Insurance coverage has been reviewed Patient has primary care physician: Yes Home environment has been reviewed: reviewed Prior level of function:: indpendent Prior/Current Home Services: No current home services Social Drivers of Health Review: SDOH reviewed no interventions necessary Readmission risk has been reviewed: Yes Transition of care needs: no transition of care needs at this time

## 2023-11-14 NOTE — Consult Note (Signed)
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 triplicated left renal arteries are somewhat diminutive but appear patent bilaterally. Nephrograms are symmetrical. IMA: Patent without evidence of aneurysm, dissection, vasculitis or significant stenosis. Inflow: Patent without evidence of aneurysm, dissection, vasculitis or significant stenosis. Veins: No obvious venous abnormality within the limitations of this arterial phase study. Review of the MIP images confirms the above findings. NON-VASCULAR Hepatobiliary: Hepatic cirrhosis with diffuse nodular contour enlargement of the lateral segment left and caudate lobes. No focal lesions are identified. Surgical absence of the gallbladder. Bile ducts are normal. Pancreas: Unremarkable. No pancreatic ductal dilatation or surrounding inflammatory changes. Spleen: Spleen is enlarged.  No focal lesions. Adrenals/Urinary Tract: 2.3 cm diameter right adrenal gland nodule containing macroscopic fat, unchanged since  previous studies. This is consistent with a myelolipoma. No imaging follow-up is indicated. Kidneys are symmetrical. No focal solid lesions. No hydronephrosis or hydroureter. Bladder is normal. Stomach/Bowel: Stomach, small bowel, and colon are not abnormally distended. No wall thickening or inflammatory stranding identified. Postoperative changes in the stomach. Appendix is not identified. Lymphatic: No significant lymphadenopathy. Reproductive: Prostate is unremarkable. Other: No abdominal wall hernia or abnormality. No abdominopelvic ascites. Musculoskeletal: Degenerative changes in the spine. No acute bony abnormalities. Review of the MIP images confirms the above findings. IMPRESSION: 1. Scattered calcification throughout the thoracoabdominal aorta consistent with aortic atherosclerosis. No aneurysm or dissection identified. 2. No large vessel occlusion. No evidence of significant pulmonary embolus. 3. No evidence of active pulmonary disease. 4. Hepatic cirrhosis with splenic enlargement. 5. Benign-appearing lesion in the right adrenal gland consistent with myelolipoma, unchanged. No imaging follow-up is indicated. Electronically Signed   By: Elsie Gravely M.D.   On: 11/13/2023 16:22   DG Chest 2 View Result Date: 11/13/2023 CLINICAL DATA:  Shortness of breath and chest pain a few days. Recent falls. EXAM: CHEST - 2 VIEW COMPARISON:  11/07/2023 FINDINGS: Lungs are adequately inflated and otherwise clear. Cardiomediastinal silhouette is unremarkable. Small linear metallic device projects over the posterior left hilar region unchanged. Remainder the exam is unchanged. IMPRESSION: No acute cardiopulmonary disease. Electronically Signed   By: Toribio Agreste M.D.   On: 11/13/2023 12:30     Assessment and Plan: Chest pain - Cardiac enzymes have been consistently negative - He has had atypical chest pain in the past, with cardiac catheterization 04/2023 with nonobstructive disease. - PET stress tests in  2019, 2021, 2023, and 2024  without ischemia -Last cath 04/2023 showed an elevated IMR increasing the likelihood of small vessel disease, Imdur  DC'd - Do not think he needs another cardiac catheterization - Although his symptoms are continuing, do not think he can tolerate an increase in the amlodipine  as SBP is frequently in the 110s. - Discuss additional options with MD  2.  Syncope, shortness of breath at rest and worse with exertion - The 2 most likely culprits are orthostasis and deconditioning from having been in bed most of the past week as well as decreased activity level for a long time. - Additionally, because of his recent diagnosis of pneumonia, he was in bed even more than usual. - Suspect his p.o. intake was down due to this as his orthostatic vital signs were positive on admission and he was symptomatic - He has had a couple of doses of hydrocodone  since admission and 3 doses of sublingual nitroglycerin , also impacting his blood pressure - Not able to get a reading on CardioMEMS without his home equipment - Suspect the reading might be low, it would help to have something to determine if he needed IV fluid resuscitation - Continue to follow on telemetry, he has conduction system disease on his ECG, but has no known history of bradycardia, pauses, or tachyarrhythmias. -CT angio was negative for PE - MD advise if he needs to wear a monitor  3.  HFpEF - His weight yesterday in the ER was 115.2 kg, was 116.8 kg on 9/16. - His weight seems to be a few kilograms higher in 2025 than it was in 2024 but is frequently as high as 118 kg or so, so this is not high for him. - Not able to get a reading on CardioMEMS without his home equipment - His Bumex  was held on admission, but he is still on spironolactone  as well as Farxiga  and Ozempic  and carvedilol , all at his home doses  4.  CAD: - Cardiac cath 04/2023 showed nonobstructive disease - Continue ASA, beta-blocker as blood pressure will  allow and statin   Risk Assessment/Risk Scores:     [place]  [employer] (NYHA) Functional Class NYHA Class III    For questions or updates, please contact [facility] Please consult [email or web address] for contact info under    I spent 41 minutes seeing this patient. During that time I reviewed their history, evaluated their symptoms, reviewed available labs, EKGs, studies, performed an exam and formulated an assessment and plan    Signed, Shona Shad, PA-C   11/14/2023 12:45 PM

## 2023-11-14 NOTE — ED Notes (Signed)
 Pt ambulated to bathroom with standby assistance, while ambulating back to bed pt c/o chest pain after using the restroom. PRN nitroglycerin  given. Pt reports pain has improved after 2 nitroglycerins. MD sigdel paged.

## 2023-11-14 NOTE — Progress Notes (Signed)
 Echocardiogram 2D Echocardiogram has been performed.  Chad [name] 11/14/2023, 2:39 PM

## 2023-11-14 NOTE — Progress Notes (Signed)
 PT Cancellation Note  Patient Details Name: Chad [name] MRN: [id number] DOB: 09/09/1955   Cancelled Treatment:    Reason Eval/Treat Not Completed: Medical issues which prohibited therapy. Per RN, MD relayed to hold PT eval due to ongoing chest pain with mobility.    [name] 11/14/2023, 11:13 AM

## 2023-11-14 NOTE — Progress Notes (Signed)
 Patient received Coreg  at 1808. Patient heart rate dropped as low as 40 nonsustained an hour after receiving medication. No drops in HR prior to administration.

## 2023-11-15 ENCOUNTER — Encounter (HOSPITAL_COMMUNITY): Payer: Self-pay | Admitting: Internal Medicine

## 2023-11-15 DIAGNOSIS — K7460 Unspecified cirrhosis of liver: Secondary | ICD-10-CM | POA: Diagnosis not present

## 2023-11-15 DIAGNOSIS — R7989 Other specified abnormal findings of blood chemistry: Secondary | ICD-10-CM

## 2023-11-15 DIAGNOSIS — R55 Syncope and collapse: Secondary | ICD-10-CM | POA: Diagnosis not present

## 2023-11-15 DIAGNOSIS — I5020 Unspecified systolic (congestive) heart failure: Secondary | ICD-10-CM

## 2023-11-15 DIAGNOSIS — R0902 Hypoxemia: Secondary | ICD-10-CM

## 2023-11-15 DIAGNOSIS — I2510 Atherosclerotic heart disease of native coronary artery without angina pectoris: Secondary | ICD-10-CM

## 2023-11-15 DIAGNOSIS — K766 Portal hypertension: Secondary | ICD-10-CM | POA: Diagnosis not present

## 2023-11-15 DIAGNOSIS — E1143 Type 2 diabetes mellitus with diabetic autonomic (poly)neuropathy: Secondary | ICD-10-CM | POA: Diagnosis not present

## 2023-11-15 DIAGNOSIS — I5030 Unspecified diastolic (congestive) heart failure: Secondary | ICD-10-CM

## 2023-11-15 LAB — BASIC METABOLIC PANEL WITH GFR
Anion gap: 11 (ref 5–15)
BUN: 15 mg/dL (ref 8–23)
CO2: 23 mmol/L (ref 22–32)
Calcium: 9 mg/dL (ref 8.9–10.3)
Chloride: 101 mmol/L (ref 98–111)
Creatinine, Ser: 1.04 mg/dL (ref 0.61–1.24)
GFR, Estimated: >60 mL/min (ref >60)
Glucose, Bld: 143 mg/dL — ABNORMAL HIGH (ref 70–99)
Potassium: 3.8 mmol/L (ref 3.5–5.1)
Sodium: 135 mmol/L (ref 135–145)

## 2023-11-15 LAB — BLOOD GAS, ARTERIAL
Acid-Base Excess: 4.2 mmol/L — ABNORMAL HIGH (ref 0.0–2.0)
Bicarbonate: 27.3 mmol/L (ref 20.0–28.0)
O2 Saturation: 99.1 %
Patient temperature: 36.8
pCO2 arterial: 35 mmHg (ref 32–48)
pH, Arterial: 7.5 — ABNORMAL HIGH (ref 7.35–7.45)
pO2, Arterial: 90 mmHg (ref 83–108)

## 2023-11-15 LAB — GLUCOSE, CAPILLARY
Glucose-Capillary: 130 mg/dL — ABNORMAL HIGH (ref 70–99)
Glucose-Capillary: 154 mg/dL — ABNORMAL HIGH (ref 70–99)

## 2023-11-15 LAB — HEMOGLOBIN A1C
Hgb A1c MFr Bld: 6.6 % — ABNORMAL HIGH (ref 4.8–5.6)
Mean Plasma Glucose: 142.72 mg/dL

## 2023-11-15 LAB — TSH: TSH: 4.574 u[IU]/mL — ABNORMAL HIGH (ref 0.350–4.500)

## 2023-11-15 MED ORDER — POTASSIUM CHLORIDE CRYS ER 20 MEQ PO TBCR
40.0000 meq | EXTENDED_RELEASE_TABLET | Freq: Two times a day (BID) | ORAL | Status: AC
  Administered 2023-11-15 (×2): 40 meq via ORAL
  Filled 2023-11-15 (×2): qty 2

## 2023-11-15 MED ORDER — PANTOPRAZOLE SODIUM 40 MG PO TBEC
40.0000 mg | DELAYED_RELEASE_TABLET | Freq: Two times a day (BID) | ORAL | Status: DC
  Administered 2023-11-15 – 2023-11-18 (×6): 40 mg via ORAL
  Filled 2023-11-15 (×6): qty 1

## 2023-11-15 MED ORDER — AMLODIPINE BESYLATE 2.5 MG PO TABS
2.5000 mg | ORAL_TABLET | Freq: Every day | ORAL | Status: DC
  Administered 2023-11-16 – 2023-11-18 (×3): 2.5 mg via ORAL
  Filled 2023-11-15 (×3): qty 1

## 2023-11-15 MED ORDER — MELATONIN 3 MG PO TABS
3.0000 mg | ORAL_TABLET | Freq: Every evening | ORAL | Status: DC | PRN
  Administered 2023-11-15 – 2023-11-17 (×3): 3 mg via ORAL
  Filled 2023-11-15 (×3): qty 1

## 2023-11-15 MED ORDER — SODIUM CHLORIDE 0.9 % IV BOLUS
1000.0000 mL | Freq: Once | INTRAVENOUS | Status: AC
  Administered 2023-11-15: 1000 mL via INTRAVENOUS

## 2023-11-15 NOTE — Plan of Care (Signed)
   Problem: Education: Goal: Knowledge of condition and prescribed therapy will improve Outcome: Progressing   Problem: Cardiac: Goal: Will achieve and/or maintain adequate cardiac output Outcome: Progressing   Problem: Physical Regulation: Goal: Complications related to the disease process, condition or treatment will be avoided or minimized Outcome: Progressing

## 2023-11-15 NOTE — Progress Notes (Signed)
 [facility] PROGRESS NOTE    Progress Note  [name]  FMW:[id number] DOB: Nov 19, 1955 DOA: 11/13/2023 PCP: Rilla Baller, MD     Brief Narrative:   Chad [name] is an 68 y.o. male past medical history significant for chronic diastolic dysfunction, diabetes mellitus type 2 diabetic retinopathy, essential hypertension coronary artery disease with a history of PCI obstructive sleep apnea presents to the ED with progressive shortness of breath and chest pain, apparently had a syncopal episode at home in the ER he started having chest pain while getting up to the bathroom received 2 nitros with improvement.  Cardiology was consulted   Assessment/Plan:   Syncope and collapse Of unclear etiology.  Twelve-lead EKG shows sinus rhythm with right bundle branch block. Cardiac biomarkers are negative. Influenza is negative.  Atypical chest pain: Twelve-lead EKG showed right bundle branch block with a left anterior fascicular block and nonspecific T wave abnormalities, cardiac biomarkers are negative x 2. Cardiology was consulted who relates this is noncardiac. Had a left heart catheter March 2025 showed nonobstructive disease. No further cardiac workup at this time. GI was consulted who recommended an ABG after being sitting upright for 15 minutes.  Evaluate gradient or PaO2 less than 80. Further evaluation per GI. Patient does have a history of obstructive sleep apnea and he relates he is not wearing that machine. He also has a hoarse voice,  hemoglobin A1c 6.7, 3 months ago.  TSH 1 month ago was 3. Specific bili was elevated on admission given IV fluids.  Constipation: Will start on lactulose .  GERD: Continue PPI.  Elevated LFTs/cirrhosis: CT scan of the abdomen pelvis showed no pathology. If this shows cirrhosis. GI recommended EGD, further evaluation per GI.  Obstructive sleep apnea Continue CPAP at night.  Essential hypertension:  Continue amlodipine , Aldactone   and Coreg .  Controlled diabetes mellitus type 2: Continue sliding scale insulin .  Check hemoglobin A1c  Hypothyroidism: Continue Synthroid .    DVT prophylaxis: lovenox  Family Communication: Daughters Status is: Observation The patient remains OBS appropriate and will d/c before 2 midnights.    Code Status:     Code Status Orders  (From admission, onward)           Start     Ordered   11/13/23 2111  Full code  Continuous       Question:  By:  Answer:  Consent: discussion documented in EHR   11/13/23 2112           Code Status History     Date Active Date Inactive Code Status Order ID Comments User Context   05/11/2023 1628 05/13/2023 1520 Full Code [id number]  Henry Manuelita NOVAK, NP ED   06/30/2017 1919 07/01/2017 1503 Full Code [id number]  Arvil Kate BIRCH, NP ED   12/27/2016 1626 12/29/2016 1806 Full Code [id number]  Barbette Cea, MD Inpatient   12/09/2016 2259 12/11/2016 1520 Full Code [id number]  Jenel Lenis, MD Inpatient   07/11/2016 1322 07/13/2016 1530 Full Code [id number]  Donata Robbi RIGGERS Inpatient   03/24/2015 1010 03/24/2015 2149 Full Code [id number]  [place], Peter M, MD Inpatient   03/22/2015 1336 03/24/2015 1010 Full Code [id number]  Armanda Standing, MD Inpatient   01/25/2013 1922 01/29/2013 1416 Full Code [id number]  Davia Nydia POUR, MD Inpatient         IV Access:   Peripheral IV   Procedures and diagnostic studies:   ECHOCARDIOGRAM COMPLETE Result Date: 11/14/2023    ECHOCARDIOGRAM REPORT   Patient Name:   Chad  E [name] Date of Exam: 11/14/2023 Medical Rec #:  [id number]      Height:       69.0 in Accession #:    [id number]     Weight:       254.0 lb Date of Birth:  10/03/1955       BSA:          2.287 m Patient Age:    68 years       BP:           111/64 mmHg Patient Gender: M              HR:           94 bpm. Exam Location:  Inpatient Procedure: 2D Echo, Cardiac Doppler, Color Doppler and Saline Contrast Bubble            Study (Both Spectral and  Color Flow Doppler were utilized during            procedure). Indications:    Syncope R55  History:        Patient has prior history of Echocardiogram examinations, most                 recent 05/12/2023. CHF, CAD, Arrythmias:Bradycardia,                 Signs/Symptoms:Chest Pain and Syncope; Risk                 Factors:Hypertension, Sleep Apnea and Diabetes.  Sonographer:    Thea Norlander RCS Referring Phys: EMERY LITTIE FUSS IMPRESSIONS  1. Left ventricular ejection fraction, by estimation, is 55 to 60%. The left ventricle has normal function. Left ventricular endocardial border not optimally defined to evaluate regional wall motion. There is mild concentric left ventricular hypertrophy. Indeterminate diastolic filling due to E-A fusion.  2. RV apical hypokinesis. Right ventricular systolic function is mildly reduced. The right ventricular size is mildly enlarged. Tricuspid regurgitation signal is inadequate for assessing PA pressure.  3. The mitral valve was not well visualized. No evidence of mitral valve regurgitation. No evidence of mitral stenosis.  4. The aortic valve is tricuspid. Aortic valve regurgitation is not visualized. No aortic stenosis is present.  5. The inferior vena cava is dilated in size with >50% respiratory variability, suggesting right atrial pressure of 8 mmHg. Comparison(s): No significant change from prior study. Prior images reviewed side by side. FINDINGS  Left Ventricle: Left ventricular ejection fraction, by estimation, is 55 to 60%. The left ventricle has normal function. Left ventricular endocardial border not optimally defined to evaluate regional wall motion. Definity  contrast agent was given IV to delineate the left ventricular endocardial borders. The left ventricular internal cavity size was small. There is mild concentric left ventricular hypertrophy. Indeterminate diastolic filling due to E-A fusion. Right Ventricle: RV apical hypokinesis. The right ventricular size is  mildly enlarged. No increase in right ventricular wall thickness. Right ventricular systolic function is mildly reduced. Tricuspid regurgitation signal is inadequate for assessing PA pressure. Left Atrium: Left atrial size was normal in size. Right Atrium: Right atrial size was normal in size. Pericardium: There is no evidence of pericardial effusion. Presence of epicardial fat layer. Mitral Valve: The mitral valve was not well visualized. No evidence of mitral valve regurgitation. No evidence of mitral valve stenosis. Tricuspid Valve: The tricuspid valve is normal in structure. Tricuspid valve regurgitation is trivial. No evidence of tricuspid stenosis. Aortic Valve: The aortic valve is tricuspid. Aortic valve regurgitation is  not visualized. No aortic stenosis is present. Aortic valve peak gradient measures 4.7 mmHg. Pulmonic Valve: The pulmonic valve was normal in structure. Pulmonic valve regurgitation is not visualized. No evidence of pulmonic stenosis. Aorta: The aortic root, ascending aorta, aortic arch and descending aorta are all structurally normal, with no evidence of dilitation or obstruction. Venous: The inferior vena cava is dilated in size with greater than 50% respiratory variability, suggesting right atrial pressure of 8 mmHg. IAS/Shunts: The atrial septum is grossly normal. Agitated saline contrast was given intravenously to evaluate for intracardiac shunting.  LEFT VENTRICLE PLAX 2D LVIDd:         3.80 cm   Diastology LVIDs:         2.50 cm   LV e' medial:    5.98 cm/s LV PW:         1.30 cm   LV E/e' medial:  11.9 LV IVS:        1.30 cm   LV e' lateral:   9.68 cm/s LVOT diam:     2.10 cm   LV E/e' lateral: 7.4 LV SV:         52 LV SV Index:   23 LVOT Area:     3.46 cm  RIGHT VENTRICLE             IVC RV S prime:     10.00 cm/s  IVC diam: 2.10 cm TAPSE (M-mode): 1.6 cm LEFT ATRIUM             Index        RIGHT ATRIUM           Index LA diam:        3.90 cm 1.71 cm/m   RA Area:     19.60 cm LA  Vol (A2C):   42.3 ml 18.50 ml/m  RA Volume:   57.30 ml  25.06 ml/m LA Vol (A4C):   40.7 ml 17.80 ml/m LA Biplane Vol: 43.8 ml 19.15 ml/m  AORTIC VALVE AV Area (Vmax): 2.82 cm AV Vmax:        108.00 cm/s AV Peak Grad:   4.7 mmHg LVOT Vmax:      88.00 cm/s LVOT Vmean:     58.200 cm/s LVOT VTI:       0.149 m  AORTA Ao Root diam: 3.50 cm Ao Asc diam:  3.70 cm MITRAL VALVE MV Area (PHT): 3.42 cm    SHUNTS MV Decel Time: 222 msec    Systemic VTI:  0.15 m MV E velocity: 71.30 cm/s  Systemic Diam: 2.10 cm MV A velocity: 93.80 cm/s MV E/A ratio:  0.76 Stanly Leavens MD Electronically signed by Stanly Leavens MD Signature Date/Time: 11/14/2023/3:16:47 PM    Final    CT Angio Chest/Abd/Pel for Dissection W and/or Wo Contrast Result Date: 11/13/2023 CLINICAL DATA:  Acute aortic syndrome suspected. Shortness of breath and chest pain for a few days. Recent falls. EXAM: CT ANGIOGRAPHY CHEST, ABDOMEN AND PELVIS TECHNIQUE: Noncontrast images were initially obtained to the chest. Multidetector CT imaging through the chest, abdomen and pelvis was performed using the standard protocol during bolus administration of intravenous contrast. Multiplanar reconstructed images and MIPs were obtained and reviewed to evaluate the vascular anatomy. RADIATION DOSE REDUCTION: This exam was performed according to the departmental dose-optimization program which includes automated exposure control, adjustment of the mA and/or kV according to patient size and/or use of iterative reconstruction technique. CONTRAST:  75mL OMNIPAQUE  IOHEXOL  350 MG/ML SOLN COMPARISON:  Chest radiograph  11/13/2023. CTA chest 10/15/2023. MRI abdomen 02/14/2023. CT abdomen and pelvis 10/10/2019 FINDINGS: CTA CHEST FINDINGS Cardiovascular: Unenhanced images of the chest demonstrate scattered calcification throughout the aorta and moderate calcification of the coronary arteries. Possible coronary stents. Calcification in the aortic valve. No evidence of  acute intramural hematoma. Images obtained during the arterial phase after intravenous contrast material administration demonstrate normal caliber thoracic aorta. No aortic aneurysm or dissection. Great vessel origins are patent. Central pulmonary arteries are well opacified without evidence of acute pulmonary embolus. Normal heart size. No pericardial effusions. Mediastinum/Nodes: No enlarged mediastinal, hilar, or axillary lymph nodes. Thyroid  gland, trachea, and esophagus demonstrate no significant findings. Lungs/Pleura: Lungs are clear. No pleural effusion or pneumothorax. Musculoskeletal: Degenerative changes in the thoracic spine. Postoperative changes in the lower cervical spine. Old rib fractures. No acute bony abnormalities. Review of the MIP images confirms the above findings. CTA ABDOMEN AND PELVIS FINDINGS VASCULAR Aorta: Normal caliber aorta without aneurysm, dissection, vasculitis or significant stenosis. Scattered aortic calcification. Celiac: Patent without evidence of aneurysm, dissection, vasculitis or significant stenosis. SMA: Patent without evidence of aneurysm, dissection, vasculitis or significant stenosis. Renals: Duplicated right and triplicated left renal arteries are somewhat diminutive but appear patent bilaterally. Nephrograms are symmetrical. IMA: Patent without evidence of aneurysm, dissection, vasculitis or significant stenosis. Inflow: Patent without evidence of aneurysm, dissection, vasculitis or significant stenosis. Veins: No obvious venous abnormality within the limitations of this arterial phase study. Review of the MIP images confirms the above findings. NON-VASCULAR Hepatobiliary: Hepatic cirrhosis with diffuse nodular contour enlargement of the lateral segment left and caudate lobes. No focal lesions are identified. Surgical absence of the gallbladder. Bile ducts are normal. Pancreas: Unremarkable. No pancreatic ductal dilatation or surrounding inflammatory changes. Spleen:  Spleen is enlarged.  No focal lesions. Adrenals/Urinary Tract: 2.3 cm diameter right adrenal gland nodule containing macroscopic fat, unchanged since previous studies. This is consistent with a myelolipoma. No imaging follow-up is indicated. Kidneys are symmetrical. No focal solid lesions. No hydronephrosis or hydroureter. Bladder is normal. Stomach/Bowel: Stomach, small bowel, and colon are not abnormally distended. No wall thickening or inflammatory stranding identified. Postoperative changes in the stomach. Appendix is not identified. Lymphatic: No significant lymphadenopathy. Reproductive: Prostate is unremarkable. Other: No abdominal wall hernia or abnormality. No abdominopelvic ascites. Musculoskeletal: Degenerative changes in the spine. No acute bony abnormalities. Review of the MIP images confirms the above findings. IMPRESSION: 1. Scattered calcification throughout the thoracoabdominal aorta consistent with aortic atherosclerosis. No aneurysm or dissection identified. 2. No large vessel occlusion. No evidence of significant pulmonary embolus. 3. No evidence of active pulmonary disease. 4. Hepatic cirrhosis with splenic enlargement. 5. Benign-appearing lesion in the right adrenal gland consistent with myelolipoma, unchanged. No imaging follow-up is indicated. Electronically Signed   By: Elsie Gravely M.D.   On: 11/13/2023 16:22   DG Chest 2 View Result Date: 11/13/2023 CLINICAL DATA:  Shortness of breath and chest pain a few days. Recent falls. EXAM: CHEST - 2 VIEW COMPARISON:  11/07/2023 FINDINGS: Lungs are adequately inflated and otherwise clear. Cardiomediastinal silhouette is unremarkable. Small linear metallic device projects over the posterior left hilar region unchanged. Remainder the exam is unchanged. IMPRESSION: No acute cardiopulmonary disease. Electronically Signed   By: Toribio Agreste M.D.   On: 11/13/2023 12:30     Medical Consultants:   None.   Subjective:    GUNTHER ZAWADZKI no  complaints  Objective:    Vitals:   11/15/23 0200 11/15/23 0326 11/15/23 0521 11/15/23 0840  BP:  115/69  121/68  Pulse: 80 69  72  Resp:  18  13  Temp:  98.4 F (36.9 C)  98.2 F (36.8 C)  TempSrc:  Oral  Oral  SpO2: (!) 86% 95%  97%  Weight:   115.7 kg   Height:       SpO2: 97 %   Intake/Output Summary (Last 24 hours) at 11/15/2023 1004 Last data filed at 11/15/2023 0900 Gross per 24 hour  Intake 840 ml  Output 2015 ml  Net -1175 ml   Filed Weights   11/13/23 1110 11/14/23 1343 11/15/23 0521  Weight: 115.2 kg 116.1 kg 115.7 kg    Exam: General exam: In no acute distress. Respiratory system: Good air movement and clear to auscultation. Cardiovascular system: S1 & S2 heard, RRR. No JVD. Gastrointestinal system: Abdomen is nondistended, soft and nontender.  Extremities: No pedal edema. Skin: No rashes, lesions or ulcers Psychiatry: Judgement and insight appear normal. Mood & affect appropriate.    Data Reviewed:    Labs: Basic Metabolic Panel: Recent Labs  Lab 11/13/23 1104 11/14/23 0359  NA 135 136  K 4.1 3.4*  CL 100 98  CO2 19* 25  GLUCOSE 180* 154*  BUN 16 19  CREATININE 1.23 1.30*  CALCIUM  9.5 8.9   GFR Estimated Creatinine Clearance: 68.2 mL/min (A) (by C-G formula based on SCr of 1.3 mg/dL (H)). Liver Function Tests: Recent Labs  Lab 11/13/23 1104 11/14/23 0359  AST 96* 97*  ALT 75* 78*  ALKPHOS 170* 165*  BILITOT 2.9* 2.9*  PROT 6.6 6.2*  ALBUMIN 4.1 3.6   Recent Labs  Lab 11/13/23 1404  LIPASE 96*   No results for input(s): AMMONIA in the last 168 hours. Coagulation profile Recent Labs  Lab 11/14/23 1447  INR 1.2   COVID-19 Labs  No results for input(s): DDIMER, FERRITIN, LDH, CRP in the last 72 hours.  Lab Results  Component Value Date   SARSCOV2NAA NEGATIVE 02/13/2020   SARSCOV2NAA NEGATIVE 02/20/2019    CBC: Recent Labs  Lab 11/13/23 1104 11/14/23 0359  WBC 6.6 6.8  NEUTROABS 4.9  --   HGB 16.7  15.6  HCT 49.2 46.3  MCV 87.1 88.4  PLT 204 163   Cardiac Enzymes: No results for input(s): CKTOTAL, CKMB, CKMBINDEX, TROPONINI in the last 168 hours. BNP (last 3 results) No results for input(s): PROBNP in the last 8760 hours. CBG: Recent Labs  Lab 11/14/23 0726 11/15/23 0539  GLUCAP 164* 130*   D-Dimer: No results for input(s): DDIMER in the last 72 hours. Hgb A1c: No results for input(s): HGBA1C in the last 72 hours. Lipid Profile: No results for input(s): CHOL, HDL, LDLCALC, TRIG, CHOLHDL, LDLDIRECT in the last 72 hours. Thyroid  function studies: No results for input(s): TSH, T4TOTAL, T3FREE, THYROIDAB in the last 72 hours.  Invalid input(s): FREET3 Anemia work up: No results for input(s): VITAMINB12, FOLATE, FERRITIN, TIBC, IRON, RETICCTPCT in the last 72 hours. Sepsis Labs: Recent Labs  Lab 11/13/23 1104 11/14/23 0359  WBC 6.6 6.8   Microbiology No results found for this or any previous visit (from the past 240 hours).   Medications:    allopurinol   100 mg Oral QODAY   [START ON 11/16/2023] amLODipine   2.5 mg Oral Daily   aspirin   81 mg Oral Daily   atorvastatin   20 mg Oral Daily   citalopram   20 mg Oral Daily   dapagliflozin  propanediol  10 mg Oral QAC breakfast   enoxaparin  (LOVENOX ) injection  40  mg Subcutaneous Q24H   famotidine   20 mg Oral QHS   ferrous sulfate   325 mg Oral Q48H   lactulose   30 g Oral BID   levothyroxine   137 mcg Oral QAC breakfast   metoCLOPramide   5 mg Oral TID AC   pantoprazole   40 mg Oral Daily   sodium chloride  flush  3 mL Intravenous Q12H   spironolactone   25 mg Oral Daily   cyanocobalamin   500 mcg Oral Daily   Continuous Infusions:    LOS: 0 days   Erle Odell Castor  [facility]  11/15/2023, 10:04 AM

## 2023-11-15 NOTE — Progress Notes (Signed)
  Progress Note  Patient Name: Chad [name] Date of Encounter: 11/15/2023 [facility] Cardiologist: None   Interval Summary    Continues to feel extremely weak this morning, still with ongoing chest pain.   Vital Signs Vitals:   11/15/23 0200 11/15/23 0326 11/15/23 0521 11/15/23 0840  BP:  115/69  121/68  Pulse: 80 69  72  Resp:  18  13  Temp:  98.4 F (36.9 C)  98.2 F (36.8 C)  TempSrc:  Oral  Oral  SpO2: (!) 86% 95%  97%  Weight:   115.7 kg   Height:        Intake/Output Summary (Last 24 hours) at 11/15/2023 0909 Last data filed at 11/15/2023 0900 Gross per 24 hour  Intake 840 ml  Output 2015 ml  Net -1175 ml      11/15/2023    5:21 AM 11/14/2023    1:43 PM 11/13/2023   11:10 AM  Last 3 Weights  Weight (lbs) 255 lb 1.2 oz 255 lb 15.3 oz 254 lb  Weight (kg) 115.7 kg 116.1 kg 115.214 kg      Telemetry/ECG  Sinus Rhythm - Personally Reviewed  Physical Exam  GEN: No acute distress.   Neck: No JVD Cardiac: RRR, no murmurs, rubs, or gallops.  Respiratory: Clear to auscultation bilaterally. GI: Soft, nontender, non-distended  MS: No edema  Assessment & Plan   68 y.o. male with a hx of PCI to [facility] 12/2016 for unstable angina with chronic chest pain, HFpEF, HTN, HLD, DM2, history of gastric bypass, gastroparesis and cervical spine fusion surgery, who was seen 11/14/2023 for the evaluation of Chest Pain at the request of Dr Mcarthur.   Chest pain  -- Last cardiac catheterization 04/2023 with nonobstructive disease but concern for microvascular disease  -- Presented with atypical chest pain with negative cardiac enzymes -- Echocardiogram 9/23 with LVEF of 55 to 60%, mild concentric LVH, RV apical hypokinesis, no significant valvular disease -- Low suspicion for cardiac chest pain, no plans for further workup at this time -- Continue aspirin , amlodipine  2.5mg  daily and statin  Syncope Generalized weakness -- mildly orthostatic on admission, continues to  feel weak this morning. Will stop coreg  3.125mg  BID for now.  -- needs to ambulate  Chronic HFpEF -- Stable weight on admission, reported dry weight of 254 at last office visit. Does have CardioMEMS but unable to obtain reading without home equipment -- does not appear volume overloaded on exam -- on Bumex  3mg  daily  Elevated LFTs MASH cirrhosis/portal HTN -- AST 96>>97, ALT 75>>78, Tbili 2.9 -- GI following   For questions or updates, please contact [facility] Please consult [email or web address] for contact info under   Signed, Manuelita Rummer, NP

## 2023-11-15 NOTE — Evaluation (Signed)
 Physical Therapy Evaluation Patient Details Name: Chad [name] MRN: [id number] DOB: [birth year] Today's Date: 11/15/2023  History of Present Illness  Chad [name] is a 68 y.o. male who presented to the ER with progressive shortness of breath and chest pain.  Patient also reported syncopal episode at home. PHMx: diastolic dysfunction CHF, morbid obesity, GERD, DM2, diabetic retinopathy, HTN, hyperlipidemia, hypothyroidism, CAD , obstructive sleep apnea, anxiety, OA (hips and knees-Bil TKA), left eye retinal vein occulsion, hypothyroid, tinnitus, C3-4 fusion, left shoulder sx.   Clinical Impression  Pt admitted with above. Pt with onset of lightheadedness while standing resulting in patient falling back onto bed due to syncope and LEs giving out. Note orthostatic BPs below however standing BP was not taken due to patient falling back on bed s/p < 30 sec of standing. At this time patient unsafe to return home as he is at significant falls risk however once patient's etiology of syncope is solved and managed I suspect he will be able to return home with supportive family. I am aware wife is also in the hospital due to a recent fall. Acute PT to cont to follow.    BP with HOB at 30 deg: 113/65 BP sitting EOB: 104/75 BP s/p 1 min sitting EOB: 113/73 BP attempted while in standing but ended up falling back onto bed: 107/69 BP in supine with HOB at 30 deg at end of session: 127/70  HR varied from 41bpm to 81bpm t/o session  SpO2 > 95% on RA t/o session      If plan is discharge home, recommend the following: A lot of help with walking and/or transfers;A lot of help with bathing/dressing/bathroom;Assist for transportation;Help with stairs or ramp for entrance   Can travel by private vehicle   No    Equipment Recommendations Rolling walker (2 wheels)  Recommendations for Other Services       Functional Status Assessment Patient has had a recent decline in their functional status and  demonstrates the ability to make significant improvements in function in a reasonable and predictable amount of time.     Precautions / Restrictions Precautions Precautions: Fall Precaution/Restrictions Comments: dizziness with change in position; syncopal episodes PTA (past 2 months per pt, but getting worse) Restrictions Weight Bearing Restrictions Per Provider Order: No      Mobility  Bed Mobility Overal bed mobility: Needs Assistance Bed Mobility: Supine to Sit, Sit to Supine     Supine to sit: Supervision, HOB elevated Sit to supine: Contact guard assist   General bed mobility comments: for supine to sit pt was reaching out to me due to dizziness,no nystagmus noted, BP noted to drop to 104/75 from 113/65 in supine upon sitting up    Transfers Overall transfer level: Needs assistance Equipment used: Rolling walker (2 wheels) Transfers: Sit to/from Stand Sit to Stand: Mod assist           General transfer comment: modA to power up, 3 attempts, s/p < 30 sec of standing pt fell back onto bed due to LEs giving out and report of lightheadedness.    Ambulation/Gait               General Gait Details: pt marching in place x 5 reps prior to falling back onto bed due to lightheadedness and LEs giving out. pt also with onset of chest pain  Stairs            Wheelchair Mobility     Tilt Bed  Modified Rankin (Stroke Patients Only)       Balance Overall balance assessment: Needs assistance Sitting-balance support: Single extremity supported, Feet supported Sitting balance-Leahy Scale: Poor Sitting balance - Comments: feels off balance--dizzy   Standing balance support: Bilateral upper extremity supported Standing balance-Leahy Scale: Poor Standing balance comment: unable to stand > 30 sec prior to falling back onto bed                             Pertinent Vitals/Pain Pain Assessment Pain Assessment: 0-10 Pain Score: 8  Pain Location:  sternal/chest pain Pain Descriptors / Indicators: Constant, Fredonia Ronde (RN notified and came to room to assess)    Home Living Family/patient expects to be discharged to:: Private residence Living Arrangements: Spouse/significant other;Children Available Help at Discharge: Family;Available 24 hours/day Type of Home: Mobile home Home Access: Ramped entrance       Home Layout: One level Home Equipment: Hand held shower head;Grab bars - tub/shower;BSC/3in1;Tub bench;Rollator (4 wheels) Additional Comments: over the past year reports no energy, just wants to sleep alot and no appetite, has lost 110 pounds over the last 2 years (has been on Ozempic ). His dtr and her husband as well as 2 grandchildren live with them)--dtr does not work out of the house--she takes care of the 68 yo.    Prior Function Prior Level of Function : Independent/Modified Independent;Driving                     Extremity/Trunk Assessment   Upper Extremity Assessment Upper Extremity Assessment: Overall WFL for tasks assessed    Lower Extremity Assessment Lower Extremity Assessment: Generalized weakness    Cervical / Trunk Assessment Cervical / Trunk Assessment: Normal  Communication   Communication Communication: Impaired Factors Affecting Communication: Hearing impaired    Cognition Arousal: Alert Behavior During Therapy: Anxious, Flat affect   PT - Cognitive impairments: No family/caregiver present to determine baseline                       PT - Cognition Comments: pt HOH which most likely contributes to delayed response time, pt initially with flat affect but then transitioned to anxious once he attempted to stand Following commands: Intact (but delayed)       Cueing Cueing Techniques: Verbal cues     General Comments General comments (skin integrity, edema, etc.): see orthostatic BP section    Exercises     Assessment/Plan    PT Assessment Patient needs continued PT  services  PT Problem List Decreased range of motion;Decreased strength;Decreased activity tolerance;Decreased balance;Decreased mobility       PT Treatment Interventions DME instruction;Gait training;Stair training;Functional mobility training;Therapeutic activities;Therapeutic exercise;Balance training    PT Goals (Current goals can be found in the Care Plan section)  Acute Rehab PT Goals Patient Stated Goal: home PT Goal Formulation: With patient Time For Goal Achievement: 11/29/23 Potential to Achieve Goals: Good    Frequency Min 3X/week     Co-evaluation               AM-PAC PT 6 Clicks Mobility  Outcome Measure Help needed turning from your back to your side while in a flat bed without using bedrails?: A Lot Help needed moving from lying on your back to sitting on the side of a flat bed without using bedrails?: A Lot Help needed moving to and from a bed to a chair (including a wheelchair)?:  A Lot Help needed standing up from a chair using your arms (e.g., wheelchair or bedside chair)?: A Lot Help needed to walk in hospital room?: Total Help needed climbing 3-5 steps with a railing? : Total 6 Click Score: 10    End of Session Equipment Utilized During Treatment: Gait belt Activity Tolerance: Treatment limited secondary to medical complications (Comment) (orthostatic BP) Patient left: in bed;with call bell/phone within reach;with nursing/sitter in room Nurse Communication: Mobility status (orthostatic BP, syncopal event, onset of chest pain) PT Visit Diagnosis: Unsteadiness on feet (R26.81);Muscle weakness (generalized) (M62.81);Difficulty in walking, not elsewhere classified (R26.2)    Time: 8879-8854 PT Time Calculation (min) (ACUTE ONLY): 25 min   Charges:   PT Evaluation $PT Eval Moderate Complexity: 1 Mod PT Treatments $Therapeutic Activity: 8-22 mins PT General Charges $$ ACUTE PT VISIT: 1 Visit         Norene Ames, PT, DPT Acute  Rehabilitation Services Secure chat preferred Office #: [phone]   Norene CHRISTELLA Ames 11/15/2023, 2:05 PM

## 2023-11-15 NOTE — Progress Notes (Signed)
 [facility] Progress Note  CC:  Weakness, chest pain, SOB  Subjective:  Sitting up in the chair visiting with family.  Looks better today.  SOB still comes and goes.  Eating some, does not feel hungry.  Objective:  Vital signs in last 24 hours: Temp:  [97.9 F (36.6 C)-98.6 F (37 C)] 98.2 F (36.8 C) (09/24 0840) Pulse Rate:  [69-87] 72 (09/24 0840) Resp:  [13-19] 13 (09/24 0840) BP: (108-131)/(62-69) 121/68 (09/24 0840) SpO2:  [86 %-99 %] 97 % (09/24 0840) Weight:  [115.7 kg-116.1 kg] 115.7 kg (09/24 0521)   General:  Alert, Well-developed, in NAD Heart:  Regular rate and rhythm; no murmurs Pulm:  CTAB.  No W/R/R. Abdomen:  Soft, non-distended.  BS present.  Non-tender.  Neurologic:  Alert and oriented x 4;  grossly normal neurologically. Psych:  Alert and cooperative. Normal mood and affect.  Intake/Output from previous day: 09/23 0701 - 09/24 0700 In: 560 [P.O.:560] Out: 1715 [Urine:1715] Intake/Output this shift: Total I/O In: 280 [P.O.:280] Out: 300 [Urine:300]  Lab Results: Recent Labs    11/13/23 1104 11/14/23 0359  WBC 6.6 6.8  HGB 16.7 15.6  HCT 49.2 46.3  PLT 204 163   BMET Recent Labs    11/13/23 1104 11/14/23 0359 11/15/23 1024  NA 135 136 135  K 4.1 3.4* 3.8  CL 100 98 101  CO2 19* 25 23  GLUCOSE 180* 154* 143*  BUN 16 19 15   CREATININE 1.23 1.30* 1.04  CALCIUM  9.5 8.9 9.0   LFT Recent Labs    11/14/23 0359  PROT 6.2*  ALBUMIN 3.6  AST 97*  ALT 78*  ALKPHOS 165*  BILITOT 2.9*   PT/INR Recent Labs    11/14/23 1447  LABPROT 15.4*  INR 1.2   ECHOCARDIOGRAM COMPLETE Result Date: 11/14/2023    ECHOCARDIOGRAM REPORT   Patient Name:   Chad [name] Date of Exam: 11/14/2023 Medical Rec #:  [id number]      Height:       69.0 in Accession #:    [id number]     Weight:       254.0 lb Date of Birth:  11-26-1955       BSA:          2.287 m Patient Age:    68 years       BP:           111/64 mmHg Patient Gender: M               HR:           94 bpm. Exam Location:  Inpatient Procedure: 2D Echo, Cardiac Doppler, Color Doppler and Saline Contrast Bubble            Study (Both Spectral and Color Flow Doppler were utilized during            procedure). Indications:    Syncope R55  History:        Patient has prior history of Echocardiogram examinations, most                 recent 05/12/2023. CHF, CAD, Arrythmias:Bradycardia,                 Signs/Symptoms:Chest Pain and Syncope; Risk                 Factors:Hypertension, Sleep Apnea and Diabetes.  Sonographer:    Thea Norlander RCS Referring Phys: EMERY LITTIE FUSS IMPRESSIONS  1. Left  ventricular ejection fraction, by estimation, is 55 to 60%. The left ventricle has normal function. Left ventricular endocardial border not optimally defined to evaluate regional wall motion. There is mild concentric left ventricular hypertrophy. Indeterminate diastolic filling due to E-A fusion.  2. RV apical hypokinesis. Right ventricular systolic function is mildly reduced. The right ventricular size is mildly enlarged. Tricuspid regurgitation signal is inadequate for assessing PA pressure.  3. The mitral valve was not well visualized. No evidence of mitral valve regurgitation. No evidence of mitral stenosis.  4. The aortic valve is tricuspid. Aortic valve regurgitation is not visualized. No aortic stenosis is present.  5. The inferior vena cava is dilated in size with >50% respiratory variability, suggesting right atrial pressure of 8 mmHg. Comparison(s): No significant change from prior study. Prior images reviewed side by side. FINDINGS  Left Ventricle: Left ventricular ejection fraction, by estimation, is 55 to 60%. The left ventricle has normal function. Left ventricular endocardial border not optimally defined to evaluate regional wall motion. Definity  contrast agent was given IV to delineate the left ventricular endocardial borders. The left ventricular internal cavity size was small. There is  mild concentric left ventricular hypertrophy. Indeterminate diastolic filling due to E-A fusion. Right Ventricle: RV apical hypokinesis. The right ventricular size is mildly enlarged. No increase in right ventricular wall thickness. Right ventricular systolic function is mildly reduced. Tricuspid regurgitation signal is inadequate for assessing PA pressure. Left Atrium: Left atrial size was normal in size. Right Atrium: Right atrial size was normal in size. Pericardium: There is no evidence of pericardial effusion. Presence of epicardial fat layer. Mitral Valve: The mitral valve was not well visualized. No evidence of mitral valve regurgitation. No evidence of mitral valve stenosis. Tricuspid Valve: The tricuspid valve is normal in structure. Tricuspid valve regurgitation is trivial. No evidence of tricuspid stenosis. Aortic Valve: The aortic valve is tricuspid. Aortic valve regurgitation is not visualized. No aortic stenosis is present. Aortic valve peak gradient measures 4.7 mmHg. Pulmonic Valve: The pulmonic valve was normal in structure. Pulmonic valve regurgitation is not visualized. No evidence of pulmonic stenosis. Aorta: The aortic root, ascending aorta, aortic arch and descending aorta are all structurally normal, with no evidence of dilitation or obstruction. Venous: The inferior vena cava is dilated in size with greater than 50% respiratory variability, suggesting right atrial pressure of 8 mmHg. IAS/Shunts: The atrial septum is grossly normal. Agitated saline contrast was given intravenously to evaluate for intracardiac shunting.  LEFT VENTRICLE PLAX 2D LVIDd:         3.80 cm   Diastology LVIDs:         2.50 cm   LV e' medial:    5.98 cm/s LV PW:         1.30 cm   LV E/e' medial:  11.9 LV IVS:        1.30 cm   LV e' lateral:   9.68 cm/s LVOT diam:     2.10 cm   LV E/e' lateral: 7.4 LV SV:         52 LV SV Index:   23 LVOT Area:     3.46 cm  RIGHT VENTRICLE             IVC RV S prime:     10.00 cm/s  IVC  diam: 2.10 cm TAPSE (M-mode): 1.6 cm LEFT ATRIUM             Index        RIGHT ATRIUM  Index LA diam:        3.90 cm 1.71 cm/m   RA Area:     19.60 cm LA Vol (A2C):   42.3 ml 18.50 ml/m  RA Volume:   57.30 ml  25.06 ml/m LA Vol (A4C):   40.7 ml 17.80 ml/m LA Biplane Vol: 43.8 ml 19.15 ml/m  AORTIC VALVE AV Area (Vmax): 2.82 cm AV Vmax:        108.00 cm/s AV Peak Grad:   4.7 mmHg LVOT Vmax:      88.00 cm/s LVOT Vmean:     58.200 cm/s LVOT VTI:       0.149 m  AORTA Ao Root diam: 3.50 cm Ao Asc diam:  3.70 cm MITRAL VALVE MV Area (PHT): 3.42 cm    SHUNTS MV Decel Time: 222 msec    Systemic VTI:  0.15 m MV E velocity: 71.30 cm/s  Systemic Diam: 2.10 cm MV A velocity: 93.80 cm/s MV E/A ratio:  0.76 Stanly Leavens MD Electronically signed by Stanly Leavens MD Signature Date/Time: 11/14/2023/3:16:47 PM    Final    CT Angio Chest/Abd/Pel for Dissection W and/or Wo Contrast Result Date: 11/13/2023 CLINICAL DATA:  Acute aortic syndrome suspected. Shortness of breath and chest pain for a few days. Recent falls. EXAM: CT ANGIOGRAPHY CHEST, ABDOMEN AND PELVIS TECHNIQUE: Noncontrast images were initially obtained to the chest. Multidetector CT imaging through the chest, abdomen and pelvis was performed using the standard protocol during bolus administration of intravenous contrast. Multiplanar reconstructed images and MIPs were obtained and reviewed to evaluate the vascular anatomy. RADIATION DOSE REDUCTION: This exam was performed according to the departmental dose-optimization program which includes automated exposure control, adjustment of the mA and/or kV according to patient size and/or use of iterative reconstruction technique. CONTRAST:  75mL OMNIPAQUE  IOHEXOL  350 MG/ML SOLN COMPARISON:  Chest radiograph 11/13/2023. CTA chest 10/15/2023. MRI abdomen 02/14/2023. CT abdomen and pelvis 10/10/2019 FINDINGS: CTA CHEST FINDINGS Cardiovascular: Unenhanced images of the chest demonstrate scattered  calcification throughout the aorta and moderate calcification of the coronary arteries. Possible coronary stents. Calcification in the aortic valve. No evidence of acute intramural hematoma. Images obtained during the arterial phase after intravenous contrast material administration demonstrate normal caliber thoracic aorta. No aortic aneurysm or dissection. Great vessel origins are patent. Central pulmonary arteries are well opacified without evidence of acute pulmonary embolus. Normal heart size. No pericardial effusions. Mediastinum/Nodes: No enlarged mediastinal, hilar, or axillary lymph nodes. Thyroid  gland, trachea, and esophagus demonstrate no significant findings. Lungs/Pleura: Lungs are clear. No pleural effusion or pneumothorax. Musculoskeletal: Degenerative changes in the thoracic spine. Postoperative changes in the lower cervical spine. Old rib fractures. No acute bony abnormalities. Review of the MIP images confirms the above findings. CTA ABDOMEN AND PELVIS FINDINGS VASCULAR Aorta: Normal caliber aorta without aneurysm, dissection, vasculitis or significant stenosis. Scattered aortic calcification. Celiac: Patent without evidence of aneurysm, dissection, vasculitis or significant stenosis. SMA: Patent without evidence of aneurysm, dissection, vasculitis or significant stenosis. Renals: Duplicated right and triplicated left renal arteries are somewhat diminutive but appear patent bilaterally. Nephrograms are symmetrical. IMA: Patent without evidence of aneurysm, dissection, vasculitis or significant stenosis. Inflow: Patent without evidence of aneurysm, dissection, vasculitis or significant stenosis. Veins: No obvious venous abnormality within the limitations of this arterial phase study. Review of the MIP images confirms the above findings. NON-VASCULAR Hepatobiliary: Hepatic cirrhosis with diffuse nodular contour enlargement of the lateral segment left and caudate lobes. No focal lesions are  identified. Surgical absence of the gallbladder.  Bile ducts are normal. Pancreas: Unremarkable. No pancreatic ductal dilatation or surrounding inflammatory changes. Spleen: Spleen is enlarged.  No focal lesions. Adrenals/Urinary Tract: 2.3 cm diameter right adrenal gland nodule containing macroscopic fat, unchanged since previous studies. This is consistent with a myelolipoma. No imaging follow-up is indicated. Kidneys are symmetrical. No focal solid lesions. No hydronephrosis or hydroureter. Bladder is normal. Stomach/Bowel: Stomach, small bowel, and colon are not abnormally distended. No wall thickening or inflammatory stranding identified. Postoperative changes in the stomach. Appendix is not identified. Lymphatic: No significant lymphadenopathy. Reproductive: Prostate is unremarkable. Other: No abdominal wall hernia or abnormality. No abdominopelvic ascites. Musculoskeletal: Degenerative changes in the spine. No acute bony abnormalities. Review of the MIP images confirms the above findings. IMPRESSION: 1. Scattered calcification throughout the thoracoabdominal aorta consistent with aortic atherosclerosis. No aneurysm or dissection identified. 2. No large vessel occlusion. No evidence of significant pulmonary embolus. 3. No evidence of active pulmonary disease. 4. Hepatic cirrhosis with splenic enlargement. 5. Benign-appearing lesion in the right adrenal gland consistent with myelolipoma, unchanged. No imaging follow-up is indicated. Electronically Signed   By: Elsie Gravely M.D.   On: 11/13/2023 16:22    Assessment / Plan: 68 year old male who presented with complaints of chest pain, SOB, dizzy, and syncope at home.  Says that overall he has felt very fatigued/SOB over the past 6 months to a year but these other symptoms/complaints are more acute.   MASH cirrhosis/portal hypertension/splenomegaly --his liver disease has remained compensated for the most part with just a slight increase in his bili from  previous.  MELD 3.0: 14 at 11/15/2023 10:24 AM MELD-Na: 14 at 11/15/2023 10:24 AM Calculated from: Serum Creatinine: 1.04 mg/dL at 0/75/7974 89:75 AM Serum Sodium: 135 mmol/L at 11/15/2023 10:24 AM Total Bilirubin: 2.9 mg/dL at 0/76/7974  6:40 AM Serum Albumin: 3.6 g/dL (Using max of 3.5 g/dL) at 0/76/7974  6:40 AM INR(ratio): 1.2 at 11/14/2023  2:47 PM Age at listing (hypothetical): 9 years Sex: Male at 11/15/2023 10:24 AM  ABG levels/readings are exactly the same as they were 4 years ago and PaO2 is 90, but AA gradient is 16.4, which puts it above 15 so consider contrast ECHO to see if shunt can be identified to diagnose hepatopulmonary syndrome.  Dr. Albertus to discuss with hospitalist.  GERD/diabetic gastroparesis --stable on home medication. --Dexilant  60 mg each morning --Famotidine  20 mg at bedtime --Metoclopramide  5 to 10 mg 3 times daily before meals as needed.   Constipation:  Reports having some issues with moving his bowels recently but started Metamucil and that has helped some.    LOS: 0 days   Harlene BIRCH. Lithzy Bernard  11/15/2023, 11:47 AM

## 2023-11-16 ENCOUNTER — Observation Stay (HOSPITAL_COMMUNITY)

## 2023-11-16 DIAGNOSIS — I452 Bifascicular block: Secondary | ICD-10-CM | POA: Diagnosis not present

## 2023-11-16 DIAGNOSIS — K766 Portal hypertension: Secondary | ICD-10-CM | POA: Diagnosis not present

## 2023-11-16 DIAGNOSIS — R079 Chest pain, unspecified: Secondary | ICD-10-CM | POA: Diagnosis not present

## 2023-11-16 DIAGNOSIS — Z8616 Personal history of COVID-19: Secondary | ICD-10-CM | POA: Diagnosis not present

## 2023-11-16 DIAGNOSIS — R55 Syncope and collapse: Secondary | ICD-10-CM | POA: Diagnosis not present

## 2023-11-16 DIAGNOSIS — I110 Hypertensive heart disease with heart failure: Secondary | ICD-10-CM | POA: Diagnosis not present

## 2023-11-16 DIAGNOSIS — R0600 Dyspnea, unspecified: Secondary | ICD-10-CM

## 2023-11-16 DIAGNOSIS — E039 Hypothyroidism, unspecified: Secondary | ICD-10-CM | POA: Diagnosis not present

## 2023-11-16 DIAGNOSIS — K7460 Unspecified cirrhosis of liver: Secondary | ICD-10-CM

## 2023-11-16 DIAGNOSIS — Q2572 Congenital pulmonary arteriovenous malformation: Secondary | ICD-10-CM | POA: Diagnosis not present

## 2023-11-16 DIAGNOSIS — K7681 Hepatopulmonary syndrome: Secondary | ICD-10-CM | POA: Diagnosis not present

## 2023-11-16 DIAGNOSIS — E11319 Type 2 diabetes mellitus with unspecified diabetic retinopathy without macular edema: Secondary | ICD-10-CM | POA: Diagnosis not present

## 2023-11-16 DIAGNOSIS — K7682 Hepatic encephalopathy: Secondary | ICD-10-CM | POA: Diagnosis not present

## 2023-11-16 DIAGNOSIS — I5042 Chronic combined systolic (congestive) and diastolic (congestive) heart failure: Secondary | ICD-10-CM | POA: Diagnosis not present

## 2023-11-16 LAB — BASIC METABOLIC PANEL WITH GFR
Anion gap: 7 (ref 5–15)
BUN: 14 mg/dL (ref 8–23)
CO2: 21 mmol/L — ABNORMAL LOW (ref 22–32)
Calcium: 8.5 mg/dL — ABNORMAL LOW (ref 8.9–10.3)
Chloride: 106 mmol/L (ref 98–111)
Creatinine, Ser: 0.85 mg/dL (ref 0.61–1.24)
GFR, Estimated: >60 mL/min (ref >60)
Glucose, Bld: 105 mg/dL — ABNORMAL HIGH (ref 70–99)
Potassium: 4 mmol/L (ref 3.5–5.1)
Sodium: 134 mmol/L — ABNORMAL LOW (ref 135–145)

## 2023-11-16 LAB — GLUCOSE, CAPILLARY: Glucose-Capillary: 111 mg/dL — ABNORMAL HIGH (ref 70–99)

## 2023-11-16 LAB — ECHOCARDIOGRAM LIMITED BUBBLE STUDY

## 2023-11-16 NOTE — Progress Notes (Signed)
 Physical Therapy Treatment Patient Details Name: Chad [name] MRN: [id number] DOB: Jul 09, 1955 Today's Date: 11/16/2023   History of Present Illness Chad [name] is a 68 y.o. male who presented to the ER with progressive shortness of breath and chest pain.  Patient also reported syncopal episode at home. PHMx: diastolic dysfunction CHF, morbid obesity, GERD, DM2, diabetic retinopathy, HTN, hyperlipidemia, hypothyroidism, CAD , obstructive sleep apnea, anxiety, OA (hips and knees-Bil TKA), left eye retinal vein occulsion, hypothyroid, tinnitus, C3-4 fusion, left shoulder sx.    PT Comments  Pt tolerated the treatment well for the majority of the session with standing exercise with rests and short distance gait with CGA and no AD.  However, CP and mild SOB/dizziness started and ambulation cease in favor or return to bed to monitor and call the RN.  Pt's HR in the low 80's, but in vent trigeminy with SpO2 at 100% on RA.  Stayed until RN taking over with EKG.    If plan is discharge home, recommend the following:  (PRN assist)   Can travel by private vehicle     No  Equipment Recommendations  Other (comment) (TBA)    Recommendations for Other Services       Precautions / Restrictions Precautions Precautions: Fall     Mobility  Bed Mobility Overal bed mobility: Modified Independent                  Transfers Overall transfer level: Needs assistance   Transfers: Sit to/from Stand Sit to Stand: Supervision                Ambulation/Gait Ambulation/Gait assistance: Contact guard assist Gait Distance (Feet): 70 Feet Assistive device: None Gait Pattern/deviations: Step-through pattern   Gait velocity interpretation: <1.8 ft/sec, indicate of risk for recurrent falls   General Gait Details: pt generally steady, but shortly into the gait, pt's shoulder started hurting and by return, pt noted some SOB, VSS on return, but then pt went into Vent trigeminy and symptoms  continued,   HR in the 80's, sats 100% on RA, but pt was hoarse, mild SOB with radiating chest discomfort.  nursing called in.   Stairs             Wheelchair Mobility     Tilt Bed    Modified Rankin (Stroke Patients Only)       Balance     Sitting balance-Leahy Scale: Good     Standing balance support: No upper extremity supported, During functional activity Standing balance-Leahy Scale: Good                              Communication    Cognition Arousal: Alert Behavior During Therapy: WFL for tasks assessed/performed   PT - Cognitive impairments: No apparent impairments                         Following commands: Intact      Cueing Cueing Techniques: Verbal cues  Exercises General Exercises - Lower Extremity Hip ABduction/ADduction: AROM, Both, 10 reps, Standing Hip Flexion/Marching: AROM, Both, 10 reps, Standing Toe Raises: AROM, 15 reps, Standing Heel Raises: AROM, 15 reps, Standing Mini-Sqauts: AROM, 10 reps, Standing    General Comments General comments (skin integrity, edema, etc.): see gait comments for cardiac incident after ambulation.      Pertinent Vitals/Pain Pain Assessment Pain Assessment: Faces Faces Pain Scale: Hurts whole lot Pain  Location: sternal/chest pain Pain Descriptors / Indicators: Discomfort Pain Intervention(s): Monitored during session    Home Living                          Prior Function            PT Goals (current goals can now be found in the care plan section) Acute Rehab PT Goals PT Goal Formulation: With patient Time For Goal Achievement: 11/29/23 Potential to Achieve Goals: Good Progress towards PT goals: Progressing toward goals    Frequency    Min 3X/week      PT Plan      Co-evaluation              AM-PAC PT 6 Clicks Mobility   Outcome Measure  Help needed turning from your back to your side while in a flat bed without using bedrails?: A  Little Help needed moving from lying on your back to sitting on the side of a flat bed without using bedrails?: A Little Help needed moving to and from a bed to a chair (including a wheelchair)?: A Little Help needed standing up from a chair using your arms (e.g., wheelchair or bedside chair)?: A Little Help needed to walk in hospital room?: A Little Help needed climbing 3-5 steps with a railing? : A Little 6 Click Score: 18    End of Session   Activity Tolerance: Other (comment) (At about the completion of session, pt contracted chest pain, see gait comments)     PT Visit Diagnosis: Difficulty in walking, not elsewhere classified (R26.2)     Time: 8242-8173 PT Time Calculation (min) (ACUTE ONLY): 29 min  Charges:    $Gait Training: 8-22 mins $Therapeutic Activity: 8-22 mins PT General Charges $$ ACUTE PT VISIT: 1 Visit                     11/16/2023  India HERO., PT Acute Rehabilitation Services [phone]  (office)   Vinie GAILS Haile Bosler 11/16/2023, 6:36 PM

## 2023-11-16 NOTE — TOC Initial Note (Signed)
 Transition of Care [facility]) - Initial/Assessment Note    Patient Details  Name: Chad [name] MRN: [id number] Date of Birth: 01/17/1956  Transition of Care [facility]) CM/SW Contact:    Sudie Erminio Deems, RN Phone Number: 11/16/2023, 12:25 PM  Clinical Narrative: Inpatient Case Manager discussed home health services with the patient; he is agreeable and he asked ICM to call his daughter Crystal to confirm an agency. ICM called Crystal and she is agreeable to home health services; however, has no agency preference.  Daughter is asking for an agency in network. ICM submitted referral to [facility] and start of care to begin within 24-48 hours post transition home. Patient will have HH RN/PT/OT and MD to place orders and F2F. DME rolling walker ordered via [employer] and will be delivered to the room prior to transition home. No further needs identified at this time.              Expected Discharge Plan: Home w Home Health Services Barriers to Discharge: No Barriers Identified   Patient Goals and CMS Choice Patient states their goals for this hospitalization and ongoing recovery are:: Patient will transition home with home health once stable.   Choice offered to / list presented to : Adult Children (Family does not have an agency preference.)      Expected Discharge Plan and Services In-house Referral: NA Discharge Planning Services: CM Consult Post Acute Care Choice: Home Health, Durable Medical Equipment Living arrangements for the past 2 months: Single Family Home                 DME Arranged: Walker rolling DME Agency: [employer] Date DME Agency Contacted: 11/16/23 Time DME Agency Contacted: 1220 Representative spoke with at DME Agency: [place] HH Arranged: RN, Disease Management, PT, OT HH Agency: [facility] Date [facility] Agency Contacted: 11/16/23 Time HH Agency Contacted: 1200 Representative spoke with at [facility] Agency: [place]  Prior Living  Arrangements/Services Living arrangements for the past 2 months: Single Family Home Lives with:: Spouse, Adult Children Patient language and need for interpreter reviewed:: Yes Do you feel safe going back to the place where you live?: Yes      Need for Family Participation in Patient Care: Yes (Comment) Care giver support system in place?: Yes (comment)   Criminal Activity/Legal Involvement Pertinent to Current Situation/Hospitalization: No - Comment as needed  Activities of Daily Living   ADL Screening (condition at time of admission) Independently performs ADLs?: No Does the patient have a NEW difficulty with bathing/dressing/toileting/self-feeding that is expected to last >3 days?: Yes (Initiates electronic notice to provider for possible OT consult) Does the patient have a NEW difficulty with getting in/out of bed, walking, or climbing stairs that is expected to last >3 days?: Yes (Initiates electronic notice to provider for possible PT consult) Does the patient have a NEW difficulty with communication that is expected to last >3 days?: No Is the patient deaf or have difficulty hearing?: No Does the patient have difficulty seeing, even when wearing glasses/contacts?: No Does the patient have difficulty concentrating, remembering, or making decisions?: No  Permission Sought/Granted Permission sought to share information with : Case Manager, Magazine features editor, Family Supports Permission granted to share information with : Yes, Verbal Permission Granted     Permission granted to share info w AGENCY: [facility], [employer]        Emotional Assessment Appearance:: Appears stated age Attitude/Demeanor/Rapport: Engaged Affect (typically observed): Appropriate Orientation: : Oriented  to Self, Oriented to Place Alcohol / Substance Use: Not Applicable Psych Involvement: No (comment)  Admission diagnosis:  Syncope and collapse [R55] Precordial chest pain  [R07.2] Transaminitis [R74.01] Syncope, unspecified syncope type [R55] Patient Active Problem List   Diagnosis Date Noted   Hypoxia 11/15/2023   HFrEF (heart failure with reduced ejection fraction) (HCC) 11/15/2023   Elevated troponin 11/15/2023   Portal hypertension (HCC) 11/14/2023   Constipation 11/14/2023   Syncope and collapse 11/13/2023   Leg cramping 05/15/2023   Chest pain 05/11/2023   Type 2 diabetes mellitus with other specified complication (HCC) 02/16/2023   Malaise and fatigue 07/25/2022   Gout 06/06/2022   Diminished pulses in lower extremity 01/16/2022   Anterior chest wall pain 03/19/2020   Elevated alkaline phosphatase level 03/19/2020   Statin intolerance 03/19/2020   Benign neoplasm of ascending colon    Abnormality of esophagus 11/12/2019   Atherosclerosis of aorta 10/13/2019   Unintended weight loss 10/02/2019   Iron deficiency 01/09/2019   DDD (degenerative disc disease), cervical 10/15/2018   Situational anxiety 05/01/2018   Asthma 12/08/2016   Bilateral hearing loss 11/17/2016   Primary osteoarthritis of left knee 06/08/2016   Diabetic neuropathy associated with type 2 diabetes mellitus (HCC) 04/04/2016   Carotid stenosis 12/21/2015   Thrombocytopenia 06/10/2015   Splenomegaly 05/07/2015   Advanced care planning/counseling discussion 04/28/2015   Myelolipoma of right adrenal gland 04/04/2015   Liver cirrhosis secondary to NASH (HCC) 04/03/2015   Cough 11/04/2013   Tinea corporis 11/04/2013   Central retinal vein occlusion with macular edema of left eye 10/17/2013   Abnormal drug screen 09/21/2013   Bariatric surgery status 08/21/2013   Diabetic gastroparesis (HCC) 04/14/2013   Bradycardia 01/04/2013   Chronic diastolic CHF (congestive heart failure) (HCC) 04/02/2012   1st degree AV block 10/06/2011   Primary osteoarthritis of right knee 06/07/2011   Pain and swelling of left ankle 05/26/2011   Medicare annual wellness visit, subsequent  03/08/2011   Arthritis 01/31/2011   OSA (obstructive sleep apnea) 01/14/2011   Severe obesity (BMI 35.0-39.9) with comorbidity (HCC) 01/14/2011   CAD (coronary artery disease), native coronary artery 01/14/2011   Irregular heart beat 12/28/2010   Diabetes mellitus type 2 with retinopathy (HCC)    HTN (hypertension)    Hyperlipidemia associated with type 2 diabetes mellitus (HCC)    GERD (gastroesophageal reflux disease)    Seasonal allergies    Hypothyroidism    Colon polyps    PCP:  Rilla Baller, MD Pharmacy:   [facility] - [place], [place] - [address] [place] [address] Phone: [phone] Fax: [phone]     Social Drivers of Health (SDOH) Social History: SDOH Screenings   Food Insecurity: Food Insecurity Present (11/14/2023)  Housing: Low Risk  (11/14/2023)  Transportation Needs: No Transportation Needs (11/14/2023)  Utilities: Not At Risk (11/14/2023)  Alcohol Screen: Low Risk  (05/25/2023)  Depression (PHQ2-9): Medium Risk (11/07/2023)  Financial Resource Strain: Low Risk  (05/25/2023)  Physical Activity: Insufficiently Active (05/25/2023)  Social Connections: Moderately Isolated (11/14/2023)  Stress: No Stress Concern Present (05/25/2023)  Tobacco Use: Low Risk  (11/14/2023)   SDOH Interventions:     Readmission Risk Interventions     No data to display

## 2023-11-16 NOTE — Plan of Care (Signed)

## 2023-11-16 NOTE — Progress Notes (Signed)
 At approx 1045, pt c/o 8/10 sharp chest pain that went across his chest and down his left arm.  VS and EKG obtained, placed on 02.  Pt given a total of 2 SL NTG with resolution of his CP.  Pt had been having episodes of bradycardia high 40's (dropped into the 30's per HS RN).  Pt reports that he generally does not feel well, has no energy, no appetite.  Dr. Odell Castor made aware of bradycardia and chest pain via securechat at 1056. No new orders.    Pt currently resting comfortably in bed, in no distress, RR regular and unlabored, on RA.  Call bell within reach and bed alarm on for safety

## 2023-11-16 NOTE — Progress Notes (Signed)
 [facility] PROGRESS NOTE    Progress Note  [name]  FMW:[id number] DOB: 06-11-1955 DOA: 11/13/2023 PCP: Rilla Baller, MD     Brief Narrative:   Chad [name] is an 68 y.o. male past medical history significant for chronic diastolic dysfunction, diabetes mellitus type 2 diabetic retinopathy, essential hypertension coronary artery disease with a history of PCI obstructive sleep apnea presents to the ED with progressive shortness of breath and chest pain, apparently had a syncopal episode at home in the ER he started having chest pain while getting up to the bathroom received 2 nitros with improvement.  Cardiology was consulted   Assessment/Plan:   Syncope and collapse Of unclear etiology.  Twelve-lead EKG shows sinus rhythm with right bundle branch block. Cardiac biomarkers are negative. Influenza is negative.  Cardiology recommended no further workup.  Atypical chest pain: Cardiology was consulted who relates this is noncardiac. Had a left heart catheter March 2025 showed nonobstructive disease. No further cardiac workup at this time. GI was consulted who recommended echo with bubble studies. Patient does have a history of obstructive sleep apnea and he relates he is not wearing that machine. He also has a hoarse voice,  hemoglobin A1c 6.7, 3 months ago.  TSH 1 month ago was 3. Specific bili was elevated on admission given IV fluids.  Constipation: Will start on lactulose .  GERD: Continue Dexilant , famotidine  and as needed metoclopramide    Elevated LFTs/cirrhosis: CT scan of the abdomen pelvis showed no pathology. If this shows cirrhosis.  Obstructive sleep apnea Continue CPAP at night.  He refuses CPAP.  Essential hypertension:  Continue amlodipine , Aldactone  and Coreg .  Controlled diabetes mellitus type 2: Continue sliding scale insulin .  Check hemoglobin A1c  Hypothyroidism: Continue Synthroid .    DVT prophylaxis: lovenox  Family  Communication: Daughters Status is: Observation The patient remains OBS appropriate and will d/c before 2 midnights.    Code Status:     Code Status Orders  (From admission, onward)           Start     Ordered   11/13/23 2111  Full code  Continuous       Question:  By:  Answer:  Consent: discussion documented in EHR   11/13/23 2112           Code Status History     Date Active Date Inactive Code Status Order ID Comments User Context   05/11/2023 1628 05/13/2023 1520 Full Code [id number]  Henry Manuelita NOVAK, NP ED   06/30/2017 1919 07/01/2017 1503 Full Code [id number]  Arvil Kate BIRCH, NP ED   12/27/2016 1626 12/29/2016 1806 Full Code [id number]  Barbette Cea, MD Inpatient   12/09/2016 2259 12/11/2016 1520 Full Code [id number]  Jenel Lenis, MD Inpatient   07/11/2016 1322 07/13/2016 1530 Full Code [id number]  Donata Robbi RIGGERS Inpatient   03/24/2015 1010 03/24/2015 2149 Full Code [id number]  [place], Peter M, MD Inpatient   03/22/2015 1336 03/24/2015 1010 Full Code [id number]  Armanda Standing, MD Inpatient   01/25/2013 1922 01/29/2013 1416 Full Code [id number]  Davia Nydia POUR, MD Inpatient         IV Access:   Peripheral IV   Procedures and diagnostic studies:   ECHOCARDIOGRAM COMPLETE Result Date: 11/14/2023    ECHOCARDIOGRAM REPORT   Patient Name:   Chad [name] Date of Exam: 11/14/2023 Medical Rec #:  [id number]      Height:       69.0 in Accession #:    [id number]  Weight:       254.0 lb Date of Birth:  11/01/1955       BSA:          2.287 m Patient Age:    68 years       BP:           111/64 mmHg Patient Gender: M              HR:           94 bpm. Exam Location:  Inpatient Procedure: 2D Echo, Cardiac Doppler, Color Doppler and Saline Contrast Bubble            Study (Both Spectral and Color Flow Doppler were utilized during            procedure). Indications:    Syncope R55  History:        Patient has prior history of Echocardiogram examinations, most                  recent 05/12/2023. CHF, CAD, Arrythmias:Bradycardia,                 Signs/Symptoms:Chest Pain and Syncope; Risk                 Factors:Hypertension, Sleep Apnea and Diabetes.  Sonographer:    Thea Norlander RCS Referring Phys: EMERY LITTIE FUSS IMPRESSIONS  1. Left ventricular ejection fraction, by estimation, is 55 to 60%. The left ventricle has normal function. Left ventricular endocardial border not optimally defined to evaluate regional wall motion. There is mild concentric left ventricular hypertrophy. Indeterminate diastolic filling due to E-A fusion.  2. RV apical hypokinesis. Right ventricular systolic function is mildly reduced. The right ventricular size is mildly enlarged. Tricuspid regurgitation signal is inadequate for assessing PA pressure.  3. The mitral valve was not well visualized. No evidence of mitral valve regurgitation. No evidence of mitral stenosis.  4. The aortic valve is tricuspid. Aortic valve regurgitation is not visualized. No aortic stenosis is present.  5. The inferior vena cava is dilated in size with >50% respiratory variability, suggesting right atrial pressure of 8 mmHg. Comparison(s): No significant change from prior study. Prior images reviewed side by side. FINDINGS  Left Ventricle: Left ventricular ejection fraction, by estimation, is 55 to 60%. The left ventricle has normal function. Left ventricular endocardial border not optimally defined to evaluate regional wall motion. Definity  contrast agent was given IV to delineate the left ventricular endocardial borders. The left ventricular internal cavity size was small. There is mild concentric left ventricular hypertrophy. Indeterminate diastolic filling due to E-A fusion. Right Ventricle: RV apical hypokinesis. The right ventricular size is mildly enlarged. No increase in right ventricular wall thickness. Right ventricular systolic function is mildly reduced. Tricuspid regurgitation signal is inadequate for assessing PA  pressure. Left Atrium: Left atrial size was normal in size. Right Atrium: Right atrial size was normal in size. Pericardium: There is no evidence of pericardial effusion. Presence of epicardial fat layer. Mitral Valve: The mitral valve was not well visualized. No evidence of mitral valve regurgitation. No evidence of mitral valve stenosis. Tricuspid Valve: The tricuspid valve is normal in structure. Tricuspid valve regurgitation is trivial. No evidence of tricuspid stenosis. Aortic Valve: The aortic valve is tricuspid. Aortic valve regurgitation is not visualized. No aortic stenosis is present. Aortic valve peak gradient measures 4.7 mmHg. Pulmonic Valve: The pulmonic valve was normal in structure. Pulmonic valve regurgitation is not visualized. No evidence of pulmonic stenosis. Aorta:  The aortic root, ascending aorta, aortic arch and descending aorta are all structurally normal, with no evidence of dilitation or obstruction. Venous: The inferior vena cava is dilated in size with greater than 50% respiratory variability, suggesting right atrial pressure of 8 mmHg. IAS/Shunts: The atrial septum is grossly normal. Agitated saline contrast was given intravenously to evaluate for intracardiac shunting.  LEFT VENTRICLE PLAX 2D LVIDd:         3.80 cm   Diastology LVIDs:         2.50 cm   LV e' medial:    5.98 cm/s LV PW:         1.30 cm   LV E/e' medial:  11.9 LV IVS:        1.30 cm   LV e' lateral:   9.68 cm/s LVOT diam:     2.10 cm   LV E/e' lateral: 7.4 LV SV:         52 LV SV Index:   23 LVOT Area:     3.46 cm  RIGHT VENTRICLE             IVC RV S prime:     10.00 cm/s  IVC diam: 2.10 cm TAPSE (M-mode): 1.6 cm LEFT ATRIUM             Index        RIGHT ATRIUM           Index LA diam:        3.90 cm 1.71 cm/m   RA Area:     19.60 cm LA Vol (A2C):   42.3 ml 18.50 ml/m  RA Volume:   57.30 ml  25.06 ml/m LA Vol (A4C):   40.7 ml 17.80 ml/m LA Biplane Vol: 43.8 ml 19.15 ml/m  AORTIC VALVE AV Area (Vmax): 2.82 cm AV  Vmax:        108.00 cm/s AV Peak Grad:   4.7 mmHg LVOT Vmax:      88.00 cm/s LVOT Vmean:     58.200 cm/s LVOT VTI:       0.149 m  AORTA Ao Root diam: 3.50 cm Ao Asc diam:  3.70 cm MITRAL VALVE MV Area (PHT): 3.42 cm    SHUNTS MV Decel Time: 222 msec    Systemic VTI:  0.15 m MV E velocity: 71.30 cm/s  Systemic Diam: 2.10 cm MV A velocity: 93.80 cm/s MV E/A ratio:  0.76 Stanly Leavens MD Electronically signed by Stanly Leavens MD Signature Date/Time: 11/14/2023/3:16:47 PM    Final      Medical Consultants:   None.   Subjective:    BERRY GODSEY no complaints  Objective:    Vitals:   11/15/23 1622 11/15/23 1959 11/15/23 2300 11/16/23 0448  BP: 124/69 115/63 118/61 (!) 110/57  Pulse: 77 71 64 66  Resp: 20 16 16 18   Temp: 97.6 F (36.4 C) 97.8 F (36.6 C) (!) 97.4 F (36.3 C) 98.7 F (37.1 C)  TempSrc: Oral Oral Oral Oral  SpO2: 100% 99% 99% 97%  Weight:    116.2 kg  Height:       SpO2: 97 %   Intake/Output Summary (Last 24 hours) at 11/16/2023 0732 Last data filed at 11/16/2023 0711 Gross per 24 hour  Intake 700 ml  Output 2350 ml  Net -1650 ml   Filed Weights   11/14/23 1343 11/15/23 0521 11/16/23 0448  Weight: 116.1 kg 115.7 kg 116.2 kg    Exam: General exam: In no acute  distress. Respiratory system: Good air movement and clear to auscultation. Cardiovascular system: S1 & S2 heard, RRR. No JVD. Gastrointestinal system: Abdomen is nondistended, soft and nontender.  Extremities: No pedal edema. Skin: No rashes, lesions or ulcers Psychiatry: Judgement and insight appear normal. Mood & affect appropriate.  Data Reviewed:    Labs: Basic Metabolic Panel: Recent Labs  Lab 11/13/23 1104 11/14/23 0359 11/15/23 1024 11/16/23 0345  NA 135 136 135 134*  K 4.1 3.4* 3.8 4.0  CL 100 98 101 106  CO2 19* 25 23 21*  GLUCOSE 180* 154* 143* 105*  BUN 16 19 15 14   CREATININE 1.23 1.30* 1.04 0.85  CALCIUM  9.5 8.9 9.0 8.5*   GFR Estimated Creatinine  Clearance: 104.6 mL/min (by C-G formula based on SCr of 0.85 mg/dL). Liver Function Tests: Recent Labs  Lab 11/13/23 1104 11/14/23 0359  AST 96* 97*  ALT 75* 78*  ALKPHOS 170* 165*  BILITOT 2.9* 2.9*  PROT 6.6 6.2*  ALBUMIN 4.1 3.6   Recent Labs  Lab 11/13/23 1404  LIPASE 96*   No results for input(s): AMMONIA in the last 168 hours. Coagulation profile Recent Labs  Lab 11/14/23 1447  INR 1.2   COVID-19 Labs  No results for input(s): DDIMER, FERRITIN, LDH, CRP in the last 72 hours.  Lab Results  Component Value Date   SARSCOV2NAA NEGATIVE 02/13/2020   SARSCOV2NAA NEGATIVE 02/20/2019    CBC: Recent Labs  Lab 11/13/23 1104 11/14/23 0359  WBC 6.6 6.8  NEUTROABS 4.9  --   HGB 16.7 15.6  HCT 49.2 46.3  MCV 87.1 88.4  PLT 204 163   Cardiac Enzymes: No results for input(s): CKTOTAL, CKMB, CKMBINDEX, TROPONINI in the last 168 hours. BNP (last 3 results) No results for input(s): PROBNP in the last 8760 hours. CBG: Recent Labs  Lab 11/14/23 0726 11/15/23 0539 11/15/23 2108 11/16/23 0454  GLUCAP 164* 130* 154* 111*   D-Dimer: No results for input(s): DDIMER in the last 72 hours. Hgb A1c: Recent Labs    11/15/23 1056  HGBA1C 6.6*   Lipid Profile: No results for input(s): CHOL, HDL, LDLCALC, TRIG, CHOLHDL, LDLDIRECT in the last 72 hours. Thyroid  function studies: Recent Labs    11/15/23 1024  TSH 4.574*   Anemia work up: No results for input(s): VITAMINB12, FOLATE, FERRITIN, TIBC, IRON, RETICCTPCT in the last 72 hours. Sepsis Labs: Recent Labs  Lab 11/13/23 1104 11/14/23 0359  WBC 6.6 6.8   Microbiology No results found for this or any previous visit (from the past 240 hours).   Medications:    allopurinol   100 mg Oral QODAY   amLODipine   2.5 mg Oral Daily   aspirin   81 mg Oral Daily   atorvastatin   20 mg Oral Daily   citalopram   20 mg Oral Daily   dapagliflozin  propanediol  10 mg Oral QAC  breakfast   enoxaparin  (LOVENOX ) injection  40 mg Subcutaneous Q24H   famotidine   20 mg Oral QHS   ferrous sulfate   325 mg Oral Q48H   levothyroxine   137 mcg Oral QAC breakfast   metoCLOPramide   5 mg Oral TID AC   pantoprazole   40 mg Oral BID   sodium chloride  flush  3 mL Intravenous Q12H   spironolactone   25 mg Oral Daily   cyanocobalamin   500 mcg Oral Daily   Continuous Infusions:    LOS: 0 days   Erle Odell Castor  [facility]  11/16/2023, 7:32 AM

## 2023-11-16 NOTE — Progress Notes (Signed)
 Occupational Therapy Treatment Patient Details Name: Chad [name] MRN: [id number] DOB: 25-Jun-1955 Today's Date: 11/16/2023   History of present illness Chad [name] is a 68 y.o. male who presented to the ER with progressive shortness of breath and chest pain.  Patient also reported syncopal episode at home. PHMx: diastolic dysfunction CHF, morbid obesity, GERD, DM2, diabetic retinopathy, HTN, hyperlipidemia, hypothyroidism, CAD , obstructive sleep apnea, anxiety, OA (hips and knees-Bil TKA), left eye retinal vein occulsion, hypothyroid, tinnitus, C3-4 fusion, left shoulder sx.   OT comments  Pt in chair upon OT arrival and eager to participate. Pt on RA with O2 SATs >90%, HR fluctuating from upper 50s-mid 60s. BP 97/48. Pt participated in seated ADL/functional tasks. Pt stood from chair min A and BP reading of 92/59, HR mid 60s-mid 70s. Pt stood less than 1 minute and then reported feeling dizzy/light headed and returned to sitting in chair. Pt HR fluctuating throughout seated activy mid 50s-upper 70s. OT will continue to follow acutely to maximize level of function and safety      If plan is discharge home, recommend the following:  A lot of help with walking and/or transfers;A lot of help with bathing/dressing/bathroom;Help with stairs or ramp for entrance;Assistance with cooking/housework;Assist for transportation   Equipment Recommendations  None recommended by OT    Recommendations for Other Services      Precautions / Restrictions Precautions Precautions: Fall Precaution/Restrictions Comments: dizziness with change in position; syncopal episodes PTA (past 2 months per pt, but getting worse) Restrictions Weight Bearing Restrictions Per Provider Order: No       Mobility Bed Mobility               General bed mobility comments: pt in chair upon arrival    Transfers Overall transfer level: Needs assistance Equipment used: Rolling walker (2 wheels) Transfers: Sit  to/from Stand Sit to Stand: Min assist           General transfer comment: min A to power up, standing less than 1 minute and pt report of lightheadedness.     Balance Overall balance assessment: Needs assistance Sitting-balance support: Single extremity supported, Feet supported Sitting balance-Leahy Scale: Poor Sitting balance - Comments: feels off balance--dizzy   Standing balance support: Bilateral upper extremity supported Standing balance-Leahy Scale: Poor Standing balance comment: unable to stand > 1 minute then reporting/dizziness/light headed                           ADL either performed or assessed with clinical judgement   ADL Overall ADL's : Needs assistance/impaired     Grooming: Wash/dry hands;Wash/dry face;Oral care;Set up;Supervision/safety;Sitting   Upper Body Bathing: Set up;Supervision/ safety;Sitting Upper Body Bathing Details (indicate cue type and reason): simulated Lower Body Bathing: Moderate assistance;Sitting/lateral leans Lower Body Bathing Details (indicate cue type and reason): simulated Upper Body Dressing : Set up;Supervision/safety;Sitting         Toilet Transfer Details (indicate cue type and reason): unable to attempt today due to dizziness upon sitting up and upon standing         Functional mobility during ADLs: Minimal assistance;Cueing for safety      Extremity/Trunk Assessment Upper Extremity Assessment Upper Extremity Assessment: Overall WFL for tasks assessed   Lower Extremity Assessment Lower Extremity Assessment: Generalized weakness   Cervical / Trunk Assessment Cervical / Trunk Assessment: Normal    Vision Ability to See in Adequate Light: 0 Adequate Patient Visual Report: No change from  baseline     Perception     Praxis     Communication Communication Communication: Impaired Factors Affecting Communication: Hearing impaired (hears better in L ear)   Cognition Arousal: Alert Behavior During  Therapy: WFL for tasks assessed/performed Cognition: No apparent impairments                               Following commands: Intact        Cueing   Cueing Techniques: Verbal cues  Exercises      Shoulder Instructions       General Comments      Pertinent Vitals/ Pain       Pain Assessment Pain Assessment: Faces Faces Pain Scale: Hurts little more Pain Location: sternal/chest pain Pain Descriptors / Indicators: Dull Pain Intervention(s): Monitored during session, Limited activity within patient's tolerance, Repositioned  Home Living                                          Prior Functioning/Environment              Frequency  Min 2X/week        Progress Toward Goals  OT Goals(current goals can now be found in the care plan section)  Progress towards OT goals: OT to reassess next treatment     Plan      Co-evaluation                 AM-PAC OT 6 Clicks Daily Activity     Outcome Measure   Help from another person eating meals?: None Help from another person taking care of personal grooming?: A Little Help from another person toileting, which includes using toliet, bedpan, or urinal?: A Little Help from another person bathing (including washing, rinsing, drying)?: A Lot Help from another person to put on and taking off regular upper body clothing?: A Little Help from another person to put on and taking off regular lower body clothing?: A Lot 6 Click Score: 17    End of Session Equipment Utilized During Treatment: Gait belt  OT Visit Diagnosis: Unsteadiness on feet (R26.81);Other abnormalities of gait and mobility (R26.89);Repeated falls (R29.6);History of falling (Z91.81);Pain Pain - part of body:  (chest upon standing)   Activity Tolerance Other (comment) (limited by dizziness, light headed)   Patient Left in bed;with call bell/phone within reach   Nurse Communication Mobility status        Time:  8985-8960 OT Time Calculation (min): 25 min  Charges: OT General Charges $OT Visit: 1 Visit OT Treatments $Self Care/Home Management : 8-22 mins $Therapeutic Activity: 8-22 mins    Jacques Karna Loose 11/16/2023, 12:35 PM

## 2023-11-17 DIAGNOSIS — R7401 Elevation of levels of liver transaminase levels: Secondary | ICD-10-CM | POA: Diagnosis not present

## 2023-11-17 DIAGNOSIS — R7989 Other specified abnormal findings of blood chemistry: Secondary | ICD-10-CM

## 2023-11-17 DIAGNOSIS — R55 Syncope and collapse: Secondary | ICD-10-CM | POA: Diagnosis present

## 2023-11-17 DIAGNOSIS — K7681 Hepatopulmonary syndrome: Secondary | ICD-10-CM | POA: Diagnosis present

## 2023-11-17 DIAGNOSIS — I441 Atrioventricular block, second degree: Secondary | ICD-10-CM | POA: Diagnosis present

## 2023-11-17 DIAGNOSIS — K219 Gastro-esophageal reflux disease without esophagitis: Secondary | ICD-10-CM | POA: Diagnosis present

## 2023-11-17 DIAGNOSIS — K7469 Other cirrhosis of liver: Secondary | ICD-10-CM | POA: Diagnosis present

## 2023-11-17 DIAGNOSIS — E039 Hypothyroidism, unspecified: Secondary | ICD-10-CM | POA: Diagnosis present

## 2023-11-17 DIAGNOSIS — K7682 Hepatic encephalopathy: Secondary | ICD-10-CM | POA: Diagnosis present

## 2023-11-17 DIAGNOSIS — Z7985 Long-term (current) use of injectable non-insulin antidiabetic drugs: Secondary | ICD-10-CM | POA: Diagnosis not present

## 2023-11-17 DIAGNOSIS — K7460 Unspecified cirrhosis of liver: Secondary | ICD-10-CM | POA: Diagnosis not present

## 2023-11-17 DIAGNOSIS — I5020 Unspecified systolic (congestive) heart failure: Secondary | ICD-10-CM | POA: Diagnosis not present

## 2023-11-17 DIAGNOSIS — I110 Hypertensive heart disease with heart failure: Secondary | ICD-10-CM | POA: Diagnosis present

## 2023-11-17 DIAGNOSIS — I2510 Atherosclerotic heart disease of native coronary artery without angina pectoris: Secondary | ICD-10-CM | POA: Diagnosis present

## 2023-11-17 DIAGNOSIS — E1143 Type 2 diabetes mellitus with diabetic autonomic (poly)neuropathy: Secondary | ICD-10-CM | POA: Diagnosis present

## 2023-11-17 DIAGNOSIS — E11319 Type 2 diabetes mellitus with unspecified diabetic retinopathy without macular edema: Secondary | ICD-10-CM | POA: Diagnosis present

## 2023-11-17 DIAGNOSIS — I5042 Chronic combined systolic (congestive) and diastolic (congestive) heart failure: Secondary | ICD-10-CM | POA: Diagnosis present

## 2023-11-17 DIAGNOSIS — I452 Bifascicular block: Secondary | ICD-10-CM | POA: Diagnosis present

## 2023-11-17 DIAGNOSIS — R627 Adult failure to thrive: Secondary | ICD-10-CM | POA: Diagnosis present

## 2023-11-17 DIAGNOSIS — Z8249 Family history of ischemic heart disease and other diseases of the circulatory system: Secondary | ICD-10-CM | POA: Diagnosis not present

## 2023-11-17 DIAGNOSIS — Q2572 Congenital pulmonary arteriovenous malformation: Secondary | ICD-10-CM | POA: Diagnosis not present

## 2023-11-17 DIAGNOSIS — K766 Portal hypertension: Secondary | ICD-10-CM | POA: Diagnosis present

## 2023-11-17 DIAGNOSIS — W19XXXA Unspecified fall, initial encounter: Secondary | ICD-10-CM | POA: Diagnosis present

## 2023-11-17 DIAGNOSIS — Z8616 Personal history of COVID-19: Secondary | ICD-10-CM | POA: Diagnosis not present

## 2023-11-17 DIAGNOSIS — E1169 Type 2 diabetes mellitus with other specified complication: Secondary | ICD-10-CM | POA: Diagnosis present

## 2023-11-17 DIAGNOSIS — K7581 Nonalcoholic steatohepatitis (NASH): Secondary | ICD-10-CM | POA: Diagnosis present

## 2023-11-17 DIAGNOSIS — Z79890 Hormone replacement therapy (postmenopausal): Secondary | ICD-10-CM | POA: Diagnosis not present

## 2023-11-17 DIAGNOSIS — E785 Hyperlipidemia, unspecified: Secondary | ICD-10-CM | POA: Diagnosis present

## 2023-11-17 LAB — GLUCOSE, CAPILLARY: Glucose-Capillary: 126 mg/dL — ABNORMAL HIGH (ref 70–99)

## 2023-11-17 LAB — BASIC METABOLIC PANEL WITH GFR
Anion gap: 12 (ref 5–15)
BUN: 10 mg/dL (ref 8–23)
CO2: 21 mmol/L — ABNORMAL LOW (ref 22–32)
Calcium: 8.7 mg/dL — ABNORMAL LOW (ref 8.9–10.3)
Chloride: 104 mmol/L (ref 98–111)
Creatinine, Ser: 0.89 mg/dL (ref 0.61–1.24)
GFR, Estimated: >60 mL/min (ref >60)
Glucose, Bld: 112 mg/dL — ABNORMAL HIGH (ref 70–99)
Potassium: 4 mmol/L (ref 3.5–5.1)
Sodium: 137 mmol/L (ref 135–145)

## 2023-11-17 MED ORDER — LACTULOSE 10 GM/15ML PO SOLN
20.0000 g | Freq: Every day | ORAL | Status: DC
Start: 2023-11-17 — End: 2023-11-17
  Filled 2023-11-17: qty 30

## 2023-11-17 MED ORDER — RIFAXIMIN 550 MG PO TABS
550.0000 mg | ORAL_TABLET | Freq: Two times a day (BID) | ORAL | Status: DC
Start: 2023-11-17 — End: 2023-11-18
  Administered 2023-11-17 – 2023-11-18 (×2): 550 mg via ORAL
  Filled 2023-11-17 (×2): qty 1

## 2023-11-17 MED ORDER — LACTULOSE 10 GM/15ML PO SOLN
20.0000 g | Freq: Two times a day (BID) | ORAL | Status: DC
  Administered 2023-11-18: 20 g via ORAL
  Filled 2023-11-17 (×2): qty 30

## 2023-11-17 NOTE — Plan of Care (Signed)

## 2023-11-17 NOTE — Progress Notes (Signed)
 [facility] PROGRESS NOTE    Progress Note  Chad [name]  FMW:[id number] DOB: 1955/08/17 DOA: 11/13/2023 PCP: Rilla Baller, MD     Brief Narrative:   Chad [name] is an 68 y.o. male past medical history significant for chronic diastolic dysfunction, diabetes mellitus type 2 diabetic retinopathy, essential hypertension coronary artery disease with a history of PCI obstructive sleep apnea presents to the ED with progressive shortness of breath and chest pain, apparently had a syncopal episode at home in the ER he started having chest pain while getting up to the bathroom received 2 nitros with improvement.  Cardiology was consulted   Assessment/Plan:   Syncope and collapse Of unclear etiology.  Twelve-lead EKG shows sinus rhythm with right bundle branch block. Cardiac biomarkers are negative. Influenza is negative.  Cardiology recommended no further workup.  Atypical chest pain: Cardiology was consulted who relates this is noncardiac. Had a left heart catheter March 2025 showed nonobstructive disease. No further cardiac workup at this time. GI was consulted who recommended echo with bubble studies. Bubble study was done that was positive for late on the 10th beat this suggest possible pulmonary AVMs. Further management per GI.  Constipation: Continue on lactulose .  GERD: Continue Dexilant , famotidine  and as needed metoclopramide    Elevated LFTs/cirrhosis: CT scan of the abdomen pelvis showed no pathology. If this shows cirrhosis.  Obstructive sleep apnea Continue CPAP at night.  He refuses CPAP.  Essential hypertension:  Continue amlodipine , Aldactone  and Coreg .  Controlled diabetes mellitus type 2: Continue sliding scale insulin .  Check hemoglobin A1c  Hypothyroidism: Continue Synthroid .    DVT prophylaxis: lovenox  Family Communication: Daughters Status is: Observation The patient remains OBS appropriate and will d/c before 2 midnights.    Code  Status:     Code Status Orders  (From admission, onward)           Start     Ordered   11/13/23 2111  Full code  Continuous       Question:  By:  Answer:  Consent: discussion documented in EHR   11/13/23 2112           Code Status History     Date Active Date Inactive Code Status Order ID Comments User Context   05/11/2023 1628 05/13/2023 1520 Full Code [id number]  Henry Manuelita NOVAK, NP ED   06/30/2017 1919 07/01/2017 1503 Full Code [id number]  Arvil Kate BIRCH, NP ED   12/27/2016 1626 12/29/2016 1806 Full Code [id number]  Barbette Cea, MD Inpatient   12/09/2016 2259 12/11/2016 1520 Full Code [id number]  Jenel Lenis, MD Inpatient   07/11/2016 1322 07/13/2016 1530 Full Code [id number]  Donata Robbi RIGGERS Inpatient   03/24/2015 1010 03/24/2015 2149 Full Code [id number]  [place], Peter M, MD Inpatient   03/22/2015 1336 03/24/2015 1010 Full Code [id number]  Armanda Standing, MD Inpatient   01/25/2013 1922 01/29/2013 1416 Full Code [id number]  Davia Nydia POUR, MD Inpatient         IV Access:   Peripheral IV   Procedures and diagnostic studies:   ECHOCARDIOGRAM LIMITED BUBBLE STUDY Result Date: 11/16/2023    ECHOCARDIOGRAM LIMITED REPORT   Patient Name:   Chad [name] Date of Exam: 11/16/2023 Medical Rec #:  [id number]      Height:       69.0 in Accession #:    [id number]     Weight:       256.2 lb Date of Birth:  08/30/1955  BSA:          2.295 m Patient Age:    68 years       BP:           110/57 mmHg Patient Gender: M              HR:           74 bpm. Exam Location:  Inpatient Procedure: 2D Echo and Saline Contrast Bubble Study Indications:    Eval for shunting which might suggest hepatopulmonary syndrome  History:        Patient has prior history of Echocardiogram examinations. CHF,                 CAD; Risk Factors:Hypertension.  Sonographer:    Charmaine Gaskins Referring Phys: 37 JAY M PYRTLE IMPRESSIONS  1. Left ventricular ejection fraction, by estimation, is 55 to 60%. The left  ventricle has normal function.  2. Right ventricular systolic function is mildly reduced. The right ventricular size is normal.  3. The bubble study was positive late, on the 10th beat after bubbles appeared on the right. This suggests possible pulmonary AVMs.  4. Limited echo for bubble study. FINDINGS  Left Ventricle: Left ventricular ejection fraction, by estimation, is 55 to 60%. The left ventricle has normal function. Right Ventricle: The right ventricular size is normal. Right ventricular systolic function is mildly reduced. Left Atrium: Left atrial size was normal in size. Right Atrium: Right atrial size was normal in size. IAS/Shunts: The bubble study was positive late, on the 10th beat after bubbles appeared on the right. This suggests possible pulmonary AVMs. Agitated saline contrast was given intravenously to evaluate for intracardiac shunting. RIGHT VENTRICLE TAPSE (M-mode): 1.9 cm Dalton McleanMD Electronically signed by Ezra Kanner Signature Date/Time: 11/16/2023/4:28:43 PM    Final      Medical Consultants:   None.   Subjective:    Chad [name] was above the same.  Objective:    Vitals:   11/17/23 0355 11/17/23 0530 11/17/23 0728 11/17/23 0804  BP:  121/61 118/70 132/74  Pulse:  77 67 75  Resp: 14 15  20   Temp: 98.7 F (37.1 C) 98.9 F (37.2 C)  98.2 F (36.8 C)  TempSrc: Oral Oral  Oral  SpO2: 97% 90% 97% 95%  Weight:  116.6 kg    Height:       SpO2: 95 % O2 Flow Rate (L/min): 4 L/min   Intake/Output Summary (Last 24 hours) at 11/17/2023 0939 Last data filed at 11/17/2023 0807 Gross per 24 hour  Intake 800 ml  Output 2260 ml  Net -1460 ml   Filed Weights   11/15/23 0521 11/16/23 0448 11/17/23 0530  Weight: 115.7 kg 116.2 kg 116.6 kg    Exam: General exam: In no acute distress. Respiratory system: Good air movement and clear to auscultation. Cardiovascular system: S1 & S2 heard, RRR. No JVD. Gastrointestinal system: Abdomen is nondistended, soft  and nontender.  Extremities: No pedal edema. Skin: No rashes, lesions or ulcers Psychiatry: Judgement and insight appear normal. Mood & affect appropriate.  Data Reviewed:    Labs: Basic Metabolic Panel: Recent Labs  Lab 11/13/23 1104 11/14/23 0359 11/15/23 1024 11/16/23 0345 11/17/23 0427  NA 135 136 135 134* 137  K 4.1 3.4* 3.8 4.0 4.0  CL 100 98 101 106 104  CO2 19* 25 23 21* 21*  GLUCOSE 180* 154* 143* 105* 112*  BUN 16 19 15 14 10   CREATININE 1.23 1.30*  1.04 0.85 0.89  CALCIUM  9.5 8.9 9.0 8.5* 8.7*   GFR Estimated Creatinine Clearance: 100.1 mL/min (by C-G formula based on SCr of 0.89 mg/dL). Liver Function Tests: Recent Labs  Lab 11/13/23 1104 11/14/23 0359  AST 96* 97*  ALT 75* 78*  ALKPHOS 170* 165*  BILITOT 2.9* 2.9*  PROT 6.6 6.2*  ALBUMIN 4.1 3.6   Recent Labs  Lab 11/13/23 1404  LIPASE 96*   No results for input(s): AMMONIA in the last 168 hours. Coagulation profile Recent Labs  Lab 11/14/23 1447  INR 1.2   COVID-19 Labs  No results for input(s): DDIMER, FERRITIN, LDH, CRP in the last 72 hours.  Lab Results  Component Value Date   SARSCOV2NAA NEGATIVE 02/13/2020   SARSCOV2NAA NEGATIVE 02/20/2019    CBC: Recent Labs  Lab 11/13/23 1104 11/14/23 0359  WBC 6.6 6.8  NEUTROABS 4.9  --   HGB 16.7 15.6  HCT 49.2 46.3  MCV 87.1 88.4  PLT 204 163   Cardiac Enzymes: No results for input(s): CKTOTAL, CKMB, CKMBINDEX, TROPONINI in the last 168 hours. BNP (last 3 results) No results for input(s): PROBNP in the last 8760 hours. CBG: Recent Labs  Lab 11/14/23 0726 11/15/23 0539 11/15/23 2108 11/16/23 0454 11/17/23 0608  GLUCAP 164* 130* 154* 111* 126*   D-Dimer: No results for input(s): DDIMER in the last 72 hours. Hgb A1c: Recent Labs    11/15/23 1056  HGBA1C 6.6*   Lipid Profile: No results for input(s): CHOL, HDL, LDLCALC, TRIG, CHOLHDL, LDLDIRECT in the last 72 hours. Thyroid  function  studies: Recent Labs    11/15/23 1024  TSH 4.574*   Anemia work up: No results for input(s): VITAMINB12, FOLATE, FERRITIN, TIBC, IRON, RETICCTPCT in the last 72 hours. Sepsis Labs: Recent Labs  Lab 11/13/23 1104 11/14/23 0359  WBC 6.6 6.8   Microbiology No results found for this or any previous visit (from the past 240 hours).   Medications:    allopurinol   100 mg Oral QODAY   amLODipine   2.5 mg Oral Daily   aspirin   81 mg Oral Daily   atorvastatin   20 mg Oral Daily   citalopram   20 mg Oral Daily   dapagliflozin  propanediol  10 mg Oral QAC breakfast   enoxaparin  (LOVENOX ) injection  40 mg Subcutaneous Q24H   famotidine   20 mg Oral QHS   ferrous sulfate   325 mg Oral Q48H   levothyroxine   137 mcg Oral QAC breakfast   metoCLOPramide   5 mg Oral TID AC   pantoprazole   40 mg Oral BID   sodium chloride  flush  3 mL Intravenous Q12H   spironolactone   25 mg Oral Daily   cyanocobalamin   500 mcg Oral Daily   Continuous Infusions:    LOS: 0 days   Erle Odell Castor  [facility]  11/17/2023, 9:39 AM

## 2023-11-17 NOTE — Progress Notes (Signed)
 Patient ID: Chad [name], male   DOB: April 21, 1955, 68 y.o.   MRN: [id number]    Progress Note   Subjective   Day # 4 CC; NASH cirrhosis, CHF, sleep apnea, for Neri artery disease presenting with shortness of breath, chest pain and syncope  Cardiac workup this admission negative left heart cath March 2025 nonobstructive disease, 2D echo with bubble study EF 55 to 60%, normal left ventricular function study was positive late on the 10th beat after bubbles appeared on the right suggesting possible pulmonary AVMs  ABG on 11/15/2023 with pO2 of 90 Labs today-sodium 137/potassium 4.0/BUN 10/creatinine 0.89 Globin A1c 6.6  MELD 3.0: 14 at 11/16/2023  3:45 AM MELD-Na: 15 at 11/16/2023  3:45 AM Calculated from: Serum Creatinine: 0.85 mg/dL (Using min of 1 mg/dL) at 0/74/7974  6:54 AM Serum Sodium: 134 mmol/L at 11/16/2023  3:45 AM Total Bilirubin: 2.9 mg/dL at 0/76/7974  6:40 AM Serum Albumin: 3.6 g/dL (Using max of 3.5 g/dL) at 0/76/7974  6:40 AM INR(ratio): 1.2 at 11/14/2023  2:47 PM Age at listing (hypothetical): 55 years Sex: Male at 11/16/2023  3:45 AM   Patient says he has not been feeling well over the past few months, on further discussion he has had daytime sleepiness and intermittent sense of brain fog etc. no overt confusion but he is aware that he is not thinking well.  He has also had significant decrease in exercise tolerance, unable to ambulate very far without shortness of breath which has been limiting his life.  Says he feels much better since admission, still has intermittent shortness of breath with exertion and had been on O2 intermittently.    Objective   Vital signs in last 24 hours: Temp:  [97.8 F (36.6 C)-98.9 F (37.2 C)] 98 F (36.7 C) (09/26 1708) Pulse Rate:  [66-77] 66 (09/26 1708) Resp:  [14-20] 16 (09/26 1708) BP: (103-132)/(61-74) 120/66 (09/26 1708) SpO2:  [90 %-99 %] 99 % (09/26 1708) Weight:  [116.6 kg] 116.6 kg (09/26 0530) Last BM Date :  11/16/23 General: Older white male in NAD, sitting up in chair, very pleasant Heart:  Regular rate and rhythm; no murmurs Lungs: Respirations even and unlabored, lungs CTA bilaterally Abdomen:  Soft, nontender and nondistended. Normal bowel sounds. Extremities:  Without edema. Neurologic:  Alert and oriented,  grossly normal neurologically. Psych:  Cooperative. Normal mood and affect.  Intake/Output from previous day: 09/25 0701 - 09/26 0700 In: 1280 [P.O.:1280] Out: 2350 [Urine:2350] Intake/Output this shift: Total I/O In: 537 [P.O.:537] Out: 210 [Urine:210]  Lab Results: No results for input(s): WBC, HGB, HCT, PLT in the last 72 hours. BMET Recent Labs    11/15/23 1024 11/16/23 0345 11/17/23 0427  NA 135 134* 137  K 3.8 4.0 4.0  CL 101 106 104  CO2 23 21* 21*  GLUCOSE 143* 105* 112*  BUN 15 14 10   CREATININE 1.04 0.85 0.89  CALCIUM  9.0 8.5* 8.7*   LFT No results for input(s): PROT, ALBUMIN, AST, ALT, ALKPHOS, BILITOT, BILIDIR, IBILI in the last 72 hours. PT/INR No results for input(s): LABPROT, INR in the last 72 hours.  Studies/Results: ECHOCARDIOGRAM LIMITED BUBBLE STUDY Result Date: 11/16/2023    ECHOCARDIOGRAM LIMITED REPORT   Patient Name:   Chad [name] Date of Exam: 11/16/2023 Medical Rec #:  [id number]      Height:       69.0 in Accession #:    [id number]     Weight:       256.2  lb Date of Birth:  01/17/1956       BSA:          2.295 m Patient Age:    68 years       BP:           110/57 mmHg Patient Gender: M              HR:           74 bpm. Exam Location:  Inpatient Procedure: 2D Echo and Saline Contrast Bubble Study Indications:    Eval for shunting which might suggest hepatopulmonary syndrome  History:        Patient has prior history of Echocardiogram examinations. CHF,                 CAD; Risk Factors:Hypertension.  Sonographer:    Charmaine Gaskins Referring Phys: 66 JAY M PYRTLE IMPRESSIONS  1. Left ventricular ejection fraction,  by estimation, is 55 to 60%. The left ventricle has normal function.  2. Right ventricular systolic function is mildly reduced. The right ventricular size is normal.  3. The bubble study was positive late, on the 10th beat after bubbles appeared on the right. This suggests possible pulmonary AVMs.  4. Limited echo for bubble study. FINDINGS  Left Ventricle: Left ventricular ejection fraction, by estimation, is 55 to 60%. The left ventricle has normal function. Right Ventricle: The right ventricular size is normal. Right ventricular systolic function is mildly reduced. Left Atrium: Left atrial size was normal in size. Right Atrium: Right atrial size was normal in size. IAS/Shunts: The bubble study was positive late, on the 10th beat after bubbles appeared on the right. This suggests possible pulmonary AVMs. Agitated saline contrast was given intravenously to evaluate for intracardiac shunting. RIGHT VENTRICLE TAPSE (M-mode): 1.9 cm Dalton McleanMD Electronically signed by Ezra Kanner Signature Date/Time: 11/16/2023/4:28:43 PM    Final        Assessment / Plan:    #51 68 year old white male who presented with complaints of chest pain, intermittent shortness of breath, dizziness and had syncopal episode at home just prior to admission.  Had not been feeling well over the past 6 months with increased fatigue and exertional dyspnea.   Diagnosis of MASH cirrhosis with portal hypertension and splenomegaly  MELD-Na= 14-15  Cardiac workup earlier this year with cardiac cath showing nonobstructive disease. Cardiac biomarkers negative this admission, no evidence for decompensated heart failure  Concern was raised regarding possible hepato-pulmonary syndrome in setting of cirrhosis Echo with bubble study this admission was positive late in the study after bubbles appeared on the right suggesting possible pulmonary AVMs which would confirm early hepatopulmonary syndrome.  Patient's O2 sats have been good  and ABG with pO2 of 90  Suspect that hepatopulmonary syndrome is playing a role in his current symptoms of dyspnea Unfortunately there is no specific treatment for this other than oxygen if he develops decompensation with decrease in O2 saturation or pO2 of 80 or less. Liver transplant is the only known cure for this situation.  Also suspect by history that he has been experiencing hepatic encephalopathy over the past several months contributing to his overall fatigue sleepiness and decrease in cognitive acuity.  Plan; okay for discharge home from GI perspective  Continue antihypertensives as per medicine service Add lactulose  30 cc p.o. twice daily Add Xifaxan  550 twice daily if his insurance will cover We will arrange for outpatient labs in 2 weeks through our office, and office follow-up with Dr.  Pyrtle/APP   Principal Problem:   Syncope and collapse Active Problems:   Diabetes mellitus type 2 with retinopathy (HCC)   HTN (hypertension)   Hyperlipidemia associated with type 2 diabetes mellitus (HCC)   GERD (gastroesophageal reflux disease)   Hypothyroidism   OSA (obstructive sleep apnea)   Severe obesity (BMI 35.0-39.9) with comorbidity (HCC)   CAD (coronary artery disease), native coronary artery   Portal hypertension (HCC)   Constipation   Hypoxia   HFrEF (heart failure with reduced ejection fraction) (HCC)   Elevated troponin     LOS: 0 days   Cyril Woodmansee PA-C 11/17/2023, 6:15 PM

## 2023-11-18 ENCOUNTER — Other Ambulatory Visit (HOSPITAL_COMMUNITY): Payer: Self-pay

## 2023-11-18 DIAGNOSIS — R55 Syncope and collapse: Secondary | ICD-10-CM | POA: Diagnosis not present

## 2023-11-18 DIAGNOSIS — R7401 Elevation of levels of liver transaminase levels: Secondary | ICD-10-CM | POA: Diagnosis not present

## 2023-11-18 LAB — BASIC METABOLIC PANEL WITH GFR
Anion gap: 8 (ref 5–15)
BUN: 11 mg/dL (ref 8–23)
CO2: 23 mmol/L (ref 22–32)
Calcium: 8.7 mg/dL — ABNORMAL LOW (ref 8.9–10.3)
Chloride: 105 mmol/L (ref 98–111)
Creatinine, Ser: 1.07 mg/dL (ref 0.61–1.24)
GFR, Estimated: >60 mL/min (ref >60)
Glucose, Bld: 110 mg/dL — ABNORMAL HIGH (ref 70–99)
Potassium: 4 mmol/L (ref 3.5–5.1)
Sodium: 136 mmol/L (ref 135–145)

## 2023-11-18 LAB — GLUCOSE, CAPILLARY: Glucose-Capillary: 129 mg/dL — ABNORMAL HIGH (ref 70–99)

## 2023-11-18 MED ORDER — RIFAXIMIN 550 MG PO TABS
550.0000 mg | ORAL_TABLET | Freq: Two times a day (BID) | ORAL | 0 refills | Status: DC
  Filled 2023-11-18: qty 42, 21d supply, fill #0

## 2023-11-18 MED ORDER — LACTULOSE 10 GM/15ML PO SOLN
20.0000 g | Freq: Two times a day (BID) | ORAL | 0 refills | Status: DC
  Filled 2023-11-18: qty 236, 4d supply, fill #0

## 2023-11-18 MED ORDER — LACTULOSE 10 GM/15ML PO SOLN: 20.0000 g | Freq: Two times a day (BID) | ORAL | 0 refills | Status: DC

## 2023-11-18 NOTE — Plan of Care (Signed)
   Problem: Education: Goal: Knowledge of condition and prescribed therapy will improve Outcome: Progressing   Problem: Cardiac: Goal: Will achieve and/or maintain adequate cardiac output Outcome: Progressing   Problem: Physical Regulation: Goal: Complications related to the disease process, condition or treatment will be avoided or minimized Outcome: Progressing

## 2023-11-18 NOTE — Discharge Summary (Signed)
 Physician Discharge Summary  Chad [name] FMW:[id number] DOB: 1955-09-21 DOA: 11/13/2023  PCP: Rilla Baller, MD  Admit date: 11/13/2023 Discharge date: 11/18/2023  Admitted From: Home Disposition:  Home  Recommendations for Outpatient Follow-up:  Follow up with GI in 1-2 weeks Please obtain BMP/CBC in one week   Home Health:Yes Equipment/Devices:None  Discharge Condition:Stable CODE STATUS:Full Diet recommendation: Heart Healthy   Brief/Interim Summary: 68 y.o. male past medical history significant for chronic diastolic dysfunction, diabetes mellitus type 2 diabetic retinopathy, essential hypertension coronary artery disease with a history of PCI obstructive sleep apnea presents to the ED with progressive shortness of breath and chest pain, apparently had a syncopal episode at home in the ER he started having chest pain while getting up to the bathroom received 2 nitros with improvement.  Cardiology and GI was consulted   Discharge Diagnoses:  Principal Problem:   Syncope and collapse Active Problems:   Diabetes mellitus type 2 with retinopathy (HCC)   HTN (hypertension)   Hyperlipidemia associated with type 2 diabetes mellitus (HCC)   GERD (gastroesophageal reflux disease)   Hypothyroidism   OSA (obstructive sleep apnea)   Severe obesity (BMI 35.0-39.9) with comorbidity (HCC)   CAD (coronary artery disease), native coronary artery   Portal hypertension (HCC)   Constipation   Hypoxia   HFrEF (heart failure with reduced ejection fraction) (HCC)   Elevated troponin   Hepatopulmonary syndrome (HCC)  Possible hepatorenal syndrome/atypical chest pain: Cardiology was consulted related no further ischemic workup. GI was consulted recommended an ABG that showed a large AA gradient. 2D echo bubble study was done that was positive concerning for pulmonary AVMs. He will be sent home on oxygen.  Syncope and collapse: Twelve-lead EKG showed sinus rhythm with right bundle  branch block. Cardiac markers were negative. Cardiology recommended further workup. She had no arrhythmias on telemetry. PT evaluated patient recommended home health PT.  Constipation: Continue lactulose .  Elevated LFTs/metabolic cirrhosis with portal hypertension: Continue lactulose  and rifaximin  and Aldactone . CT scan of the abdomen pelvis showed no acute pathology.  Hepatic encephalopathy: Continue lactulose  and rifaximin .  Essential hypertension: Continue amlodipine , Aldactone  and Coreg .  Diabetes mellitus type 2: Blood glucose relatively well-controlled continue Farxiga  as an outpatient.  Hypothyroidism:  Continue Synthroid .    Discharge Instructions  Discharge Instructions     Diet - low sodium heart healthy   Complete by: As directed    Increase activity slowly   Complete by: As directed    No wound care   Complete by: As directed       Allergies as of 11/18/2023       Reactions   Metformin  And Related Diarrhea        Medication List     STOP taking these medications    amoxicillin -clavulanate 875-125 MG tablet Commonly known as: AUGMENTIN    bumetanide  1 MG tablet Commonly known as: BUMEX    potassium chloride  10 MEQ tablet Commonly known as: KLOR-CON        TAKE these medications    albuterol  108 (90 Base) MCG/ACT inhaler Commonly known as: VENTOLIN  HFA Inhale 2 puffs into the lungs every 6 (six) hours as needed for wheezing or shortness of breath.   allopurinol  100 MG tablet Commonly known as: ZYLOPRIM  Take 1 tablet (100 mg total) by mouth every other day.   amLODipine  2.5 MG tablet Commonly known as: NORVASC  Take 1 tablet (2.5 mg total) by mouth daily.   aspirin  EC 81 MG tablet Take 81 mg by mouth daily.  atorvastatin  20 MG tablet Commonly known as: LIPITOR Take 1 tablet (20 mg total) by mouth daily. What changed: when to take this   carvedilol  3.125 MG tablet Commonly known as: COREG  Take 3.125 mg by mouth 2 (two) times  daily with a meal.   citalopram  20 MG tablet Commonly known as: CELEXA  Take 1 tablet (20 mg total) by mouth daily.   colchicine  0.6 MG tablet Take 1 tablet (0.6 mg total) by mouth daily as needed (gout flare).   cyanocobalamin  500 MCG tablet Commonly known as: VITAMIN B12 Take 1 tablet (500 mcg total) by mouth daily.   dapagliflozin  propanediol 10 MG Tabs tablet Commonly known as: FARXIGA  Take 10 mg by mouth daily before breakfast.   dexlansoprazole  60 MG capsule Commonly known as: DEXILANT  Take 1 capsule (60 mg total) by mouth daily.   famotidine  20 MG tablet Commonly known as: PEPCID  Take 1 tablet (20 mg total) by mouth at bedtime.   Ferrous Sulfate  27 MG Tabs Take 1 tablet by mouth daily with breakfast.   fluticasone  50 MCG/ACT nasal spray Commonly known as: FLONASE  Place 2 sprays into both nostrils daily.   lactulose  10 GM/15ML solution Commonly known as: CHRONULAC  Take 30 mLs (20 g total) by mouth 2 (two) times daily.   levothyroxine  137 MCG tablet Commonly known as: SYNTHROID  Take 1 tablet (137 mcg total) by mouth daily before breakfast. TAKE ONE TAB BY MOUTH ONCE DAILY. TAKE ON AN EMPTY STOMACH WITH A GLASS OF WATER ATLEAST 30-60 MINUTES BEFORE BREAKFAST   magnesium  oxide 400 (240 Mg) MG tablet Commonly known as: MAG-OX Take 400 mg by mouth.   Metamucil 4 in 1 Fiber 55.6 % Powd Generic drug: Psyllium Take 1 Dose by mouth in the morning.   metoCLOPramide  5 MG tablet Commonly known as: REGLAN  TAKE ONE TABLET (5 MG TOTAL) BY MOUTH EVERY EIGHT HOURS AS NEEDED FOR NAUSEA.   nitroGLYCERIN  0.4 MG SL tablet Commonly known as: Nitrostat  Place 1 tablet (0.4 mg total) under the tongue every 5 (five) minutes as needed for chest pain.   One-A-Day Mens 50+ Tabs Take 1 tablet by mouth in the morning.   Ozempic  (2 MG/DOSE) 8 MG/3ML Sopn Generic drug: Semaglutide  (2 MG/DOSE) Inject 2 mg into the skin once a week. What changed: additional instructions   rifaximin   550 MG Tabs tablet Commonly known as: XIFAXAN  Take 1 tablet (550 mg total) by mouth 2 (two) times daily.   spironolactone  25 MG tablet Commonly known as: ALDACTONE  Take 25 mg by mouth daily.   vitamin E  180 MG (400 UNITS) capsule Take 400 Units by mouth in the morning.               Durable Medical Equipment  (From admission, onward)           Start     Ordered   11/18/23 0700  For home use only DME oxygen  Once       Question Answer Comment  Length of Need 6 Months   Mode or (Route) Nasal cannula   Liters per Minute 2   Frequency Continuous (stationary and portable oxygen unit needed)   Oxygen delivery system Gas      11/18/23 0659   11/16/23 1222  For home use only DME Walker rolling  Once       Question Answer Comment  Walker: With 5 Inch Wheels   Patient needs a walker to treat with the following condition General weakness  11/16/23 1222            Follow-up Information     [facility], [facility] Follow up.   Why: Registered Nurse, Physical and Occupational Therapy-office to call with visit times. Contact information: [address] [place] [address] [phone]         [facility] Follow up.   Why: Rolling Walker-office to deliver to the room. Contact information: Address: [address], [place], [place] [address] Phone: [phone]               Allergies  Allergen Reactions   Metformin  And Related Diarrhea    Consultations: GI Cardiology   Procedures/Studies: ECHOCARDIOGRAM LIMITED BUBBLE STUDY Result Date: 11/16/2023    ECHOCARDIOGRAM LIMITED REPORT   Patient Name:   Emmerich E Rembold Date of Exam: 11/16/2023 Medical Rec #:  [id number]      Height:       69.0 in Accession #:    [id number]     Weight:       256.2 lb Date of Birth:  September 30, 1955       BSA:          2.295 m Patient Age:    68 years       BP:           110/57 mmHg Patient Gender: M              HR:           74 bpm. Exam Location:   Inpatient Procedure: 2D Echo and Saline Contrast Bubble Study Indications:    Eval for shunting which might suggest hepatopulmonary syndrome  History:        Patient has prior history of Echocardiogram examinations. CHF,                 CAD; Risk Factors:Hypertension.  Sonographer:    Charmaine Gaskins Referring Phys: 11 JAY M PYRTLE IMPRESSIONS  1. Left ventricular ejection fraction, by estimation, is 55 to 60%. The left ventricle has normal function.  2. Right ventricular systolic function is mildly reduced. The right ventricular size is normal.  3. The bubble study was positive late, on the 10th beat after bubbles appeared on the right. This suggests possible pulmonary AVMs.  4. Limited echo for bubble study. FINDINGS  Left Ventricle: Left ventricular ejection fraction, by estimation, is 55 to 60%. The left ventricle has normal function. Right Ventricle: The right ventricular size is normal. Right ventricular systolic function is mildly reduced. Left Atrium: Left atrial size was normal in size. Right Atrium: Right atrial size was normal in size. IAS/Shunts: The bubble study was positive late, on the 10th beat after bubbles appeared on the right. This suggests possible pulmonary AVMs. Agitated saline contrast was given intravenously to evaluate for intracardiac shunting. RIGHT VENTRICLE TAPSE (M-mode): 1.9 cm Dalton McleanMD Electronically signed by Ezra Kanner Signature Date/Time: 11/16/2023/4:28:43 PM    Final    ECHOCARDIOGRAM COMPLETE Result Date: 11/14/2023    ECHOCARDIOGRAM REPORT   Patient Name:   BRANKO STEEVES Date of Exam: 11/14/2023 Medical Rec #:  [id number]      Height:       69.0 in Accession #:    [id number]     Weight:       254.0 lb Date of Birth:  1955-07-22       BSA:          2.287 m Patient Age:    23 years  BP:           111/64 mmHg Patient Gender: M              HR:           94 bpm. Exam Location:  Inpatient Procedure: 2D Echo, Cardiac Doppler, Color Doppler and Saline Contrast  Bubble            Study (Both Spectral and Color Flow Doppler were utilized during            procedure). Indications:    Syncope R55  History:        Patient has prior history of Echocardiogram examinations, most                 recent 05/12/2023. CHF, CAD, Arrythmias:Bradycardia,                 Signs/Symptoms:Chest Pain and Syncope; Risk                 Factors:Hypertension, Sleep Apnea and Diabetes.  Sonographer:    Thea Norlander RCS Referring Phys: EMERY LITTIE FUSS IMPRESSIONS  1. Left ventricular ejection fraction, by estimation, is 55 to 60%. The left ventricle has normal function. Left ventricular endocardial border not optimally defined to evaluate regional wall motion. There is mild concentric left ventricular hypertrophy. Indeterminate diastolic filling due to E-A fusion.  2. RV apical hypokinesis. Right ventricular systolic function is mildly reduced. The right ventricular size is mildly enlarged. Tricuspid regurgitation signal is inadequate for assessing PA pressure.  3. The mitral valve was not well visualized. No evidence of mitral valve regurgitation. No evidence of mitral stenosis.  4. The aortic valve is tricuspid. Aortic valve regurgitation is not visualized. No aortic stenosis is present.  5. The inferior vena cava is dilated in size with >50% respiratory variability, suggesting right atrial pressure of 8 mmHg. Comparison(s): No significant change from prior study. Prior images reviewed side by side. FINDINGS  Left Ventricle: Left ventricular ejection fraction, by estimation, is 55 to 60%. The left ventricle has normal function. Left ventricular endocardial border not optimally defined to evaluate regional wall motion. Definity  contrast agent was given IV to delineate the left ventricular endocardial borders. The left ventricular internal cavity size was small. There is mild concentric left ventricular hypertrophy. Indeterminate diastolic filling due to E-A fusion. Right Ventricle: RV apical  hypokinesis. The right ventricular size is mildly enlarged. No increase in right ventricular wall thickness. Right ventricular systolic function is mildly reduced. Tricuspid regurgitation signal is inadequate for assessing PA pressure. Left Atrium: Left atrial size was normal in size. Right Atrium: Right atrial size was normal in size. Pericardium: There is no evidence of pericardial effusion. Presence of epicardial fat layer. Mitral Valve: The mitral valve was not well visualized. No evidence of mitral valve regurgitation. No evidence of mitral valve stenosis. Tricuspid Valve: The tricuspid valve is normal in structure. Tricuspid valve regurgitation is trivial. No evidence of tricuspid stenosis. Aortic Valve: The aortic valve is tricuspid. Aortic valve regurgitation is not visualized. No aortic stenosis is present. Aortic valve peak gradient measures 4.7 mmHg. Pulmonic Valve: The pulmonic valve was normal in structure. Pulmonic valve regurgitation is not visualized. No evidence of pulmonic stenosis. Aorta: The aortic root, ascending aorta, aortic arch and descending aorta are all structurally normal, with no evidence of dilitation or obstruction. Venous: The inferior vena cava is dilated in size with greater than 50% respiratory variability, suggesting right atrial pressure of 8 mmHg. IAS/Shunts: The  atrial septum is grossly normal. Agitated saline contrast was given intravenously to evaluate for intracardiac shunting.  LEFT VENTRICLE PLAX 2D LVIDd:         3.80 cm   Diastology LVIDs:         2.50 cm   LV e' medial:    5.98 cm/s LV PW:         1.30 cm   LV E/e' medial:  11.9 LV IVS:        1.30 cm   LV e' lateral:   9.68 cm/s LVOT diam:     2.10 cm   LV E/e' lateral: 7.4 LV SV:         52 LV SV Index:   23 LVOT Area:     3.46 cm  RIGHT VENTRICLE             IVC RV S prime:     10.00 cm/s  IVC diam: 2.10 cm TAPSE (M-mode): 1.6 cm LEFT ATRIUM             Index        RIGHT ATRIUM           Index LA diam:        3.90  cm 1.71 cm/m   RA Area:     19.60 cm LA Vol (A2C):   42.3 ml 18.50 ml/m  RA Volume:   57.30 ml  25.06 ml/m LA Vol (A4C):   40.7 ml 17.80 ml/m LA Biplane Vol: 43.8 ml 19.15 ml/m  AORTIC VALVE AV Area (Vmax): 2.82 cm AV Vmax:        108.00 cm/s AV Peak Grad:   4.7 mmHg LVOT Vmax:      88.00 cm/s LVOT Vmean:     58.200 cm/s LVOT VTI:       0.149 m  AORTA Ao Root diam: 3.50 cm Ao Asc diam:  3.70 cm MITRAL VALVE MV Area (PHT): 3.42 cm    SHUNTS MV Decel Time: 222 msec    Systemic VTI:  0.15 m MV E velocity: 71.30 cm/s  Systemic Diam: 2.10 cm MV A velocity: 93.80 cm/s MV E/A ratio:  0.76 Stanly Leavens MD Electronically signed by Stanly Leavens MD Signature Date/Time: 11/14/2023/3:16:47 PM    Final    CT Angio Chest/Abd/Pel for Dissection W and/or Wo Contrast Result Date: 11/13/2023 CLINICAL DATA:  Acute aortic syndrome suspected. Shortness of breath and chest pain for a few days. Recent falls. EXAM: CT ANGIOGRAPHY CHEST, ABDOMEN AND PELVIS TECHNIQUE: Noncontrast images were initially obtained to the chest. Multidetector CT imaging through the chest, abdomen and pelvis was performed using the standard protocol during bolus administration of intravenous contrast. Multiplanar reconstructed images and MIPs were obtained and reviewed to evaluate the vascular anatomy. RADIATION DOSE REDUCTION: This exam was performed according to the departmental dose-optimization program which includes automated exposure control, adjustment of the mA and/or kV according to patient size and/or use of iterative reconstruction technique. CONTRAST:  75mL OMNIPAQUE  IOHEXOL  350 MG/ML SOLN COMPARISON:  Chest radiograph 11/13/2023. CTA chest 10/15/2023. MRI abdomen 02/14/2023. CT abdomen and pelvis 10/10/2019 FINDINGS: CTA CHEST FINDINGS Cardiovascular: Unenhanced images of the chest demonstrate scattered calcification throughout the aorta and moderate calcification of the coronary arteries. Possible coronary stents.  Calcification in the aortic valve. No evidence of acute intramural hematoma. Images obtained during the arterial phase after intravenous contrast material administration demonstrate normal caliber thoracic aorta. No aortic aneurysm or dissection. Great vessel origins are patent. Central pulmonary arteries are  well opacified without evidence of acute pulmonary embolus. Normal heart size. No pericardial effusions. Mediastinum/Nodes: No enlarged mediastinal, hilar, or axillary lymph nodes. Thyroid  gland, trachea, and esophagus demonstrate no significant findings. Lungs/Pleura: Lungs are clear. No pleural effusion or pneumothorax. Musculoskeletal: Degenerative changes in the thoracic spine. Postoperative changes in the lower cervical spine. Old rib fractures. No acute bony abnormalities. Review of the MIP images confirms the above findings. CTA ABDOMEN AND PELVIS FINDINGS VASCULAR Aorta: Normal caliber aorta without aneurysm, dissection, vasculitis or significant stenosis. Scattered aortic calcification. Celiac: Patent without evidence of aneurysm, dissection, vasculitis or significant stenosis. SMA: Patent without evidence of aneurysm, dissection, vasculitis or significant stenosis. Renals: Duplicated right and triplicated left renal arteries are somewhat diminutive but appear patent bilaterally. Nephrograms are symmetrical. IMA: Patent without evidence of aneurysm, dissection, vasculitis or significant stenosis. Inflow: Patent without evidence of aneurysm, dissection, vasculitis or significant stenosis. Veins: No obvious venous abnormality within the limitations of this arterial phase study. Review of the MIP images confirms the above findings. NON-VASCULAR Hepatobiliary: Hepatic cirrhosis with diffuse nodular contour enlargement of the lateral segment left and caudate lobes. No focal lesions are identified. Surgical absence of the gallbladder. Bile ducts are normal. Pancreas: Unremarkable. No pancreatic ductal  dilatation or surrounding inflammatory changes. Spleen: Spleen is enlarged.  No focal lesions. Adrenals/Urinary Tract: 2.3 cm diameter right adrenal gland nodule containing macroscopic fat, unchanged since previous studies. This is consistent with a myelolipoma. No imaging follow-up is indicated. Kidneys are symmetrical. No focal solid lesions. No hydronephrosis or hydroureter. Bladder is normal. Stomach/Bowel: Stomach, small bowel, and colon are not abnormally distended. No wall thickening or inflammatory stranding identified. Postoperative changes in the stomach. Appendix is not identified. Lymphatic: No significant lymphadenopathy. Reproductive: Prostate is unremarkable. Other: No abdominal wall hernia or abnormality. No abdominopelvic ascites. Musculoskeletal: Degenerative changes in the spine. No acute bony abnormalities. Review of the MIP images confirms the above findings. IMPRESSION: 1. Scattered calcification throughout the thoracoabdominal aorta consistent with aortic atherosclerosis. No aneurysm or dissection identified. 2. No large vessel occlusion. No evidence of significant pulmonary embolus. 3. No evidence of active pulmonary disease. 4. Hepatic cirrhosis with splenic enlargement. 5. Benign-appearing lesion in the right adrenal gland consistent with myelolipoma, unchanged. No imaging follow-up is indicated. Electronically Signed   By: Elsie Gravely M.D.   On: 11/13/2023 16:22   DG Chest 2 View Result Date: 11/13/2023 CLINICAL DATA:  Shortness of breath and chest pain a few days. Recent falls. EXAM: CHEST - 2 VIEW COMPARISON:  11/07/2023 FINDINGS: Lungs are adequately inflated and otherwise clear. Cardiomediastinal silhouette is unremarkable. Small linear metallic device projects over the posterior left hilar region unchanged. Remainder the exam is unchanged. IMPRESSION: No acute cardiopulmonary disease. Electronically Signed   By: Toribio Agreste M.D.   On: 11/13/2023 12:30   DG Chest 2  View Result Date: 11/07/2023 CLINICAL DATA:  Two week history of productive cough, fatigue, and rales EXAM: DG CHEST 2V COMPARISON:  Chest radiograph dated 10/14/2023 FINDINGS: Normal lung volumes. No focal consolidations. No pleural effusion or pneumothorax. The heart size and mediastinal contours are within normal limits. Similar left bronchial occluder device in-situ. Cervical spinal fixation hardware appears intact. Old bilateral rib fractures. Right upper quadrant surgical clips. IMPRESSION: No active cardiopulmonary disease. Electronically Signed   By: Limin  Xu M.D.   On: 11/07/2023 13:41   (Echo, Carotid, EGD, Colonoscopy, ERCP)    Subjective:  No complaints Discharge Exam: Vitals:   11/17/23 2304 11/18/23 0500  BP: 116/68 ROLLEN)  117/59  Pulse: 74 60  Resp: 18 20  Temp: 98.1 F (36.7 C) 97.7 F (36.5 C)  SpO2: 100% 96%   Vitals:   11/17/23 1708 11/17/23 2006 11/17/23 2304 11/18/23 0500  BP: 120/66 123/62 116/68 (!) 117/59  Pulse: 66 77 74 60  Resp: 16 20 18 20   Temp: 98 F (36.7 C) (!) 97.5 F (36.4 C) 98.1 F (36.7 C) 97.7 F (36.5 C)  TempSrc: Oral Oral Oral Oral  SpO2: 99% 100% 100% 96%  Weight:    114.9 kg  Height:        General: Pt is alert, awake, not in acute distress Cardiovascular: RRR, S1/S2 +, no rubs, no gallops Respiratory: CTA bilaterally, no wheezing, no rhonchi Abdominal: Soft, NT, ND, bowel sounds + Extremities: no edema, no cyanosis    The results of significant diagnostics from this hospitalization (including imaging, microbiology, ancillary and laboratory) are listed below for reference.     Microbiology: No results found for this or any previous visit (from the past 240 hours).   Labs: BNP (last 3 results) Recent Labs    05/11/23 1453 11/13/23 1105  BNP 5.1 28.5   Basic Metabolic Panel: Recent Labs  Lab 11/14/23 0359 11/15/23 1024 11/16/23 0345 11/17/23 0427 11/18/23 0434  NA 136 135 134* 137 136  K 3.4* 3.8 4.0 4.0 4.0   CL 98 101 106 104 105  CO2 25 23 21* 21* 23  GLUCOSE 154* 143* 105* 112* 110*  BUN 19 15 14 10 11   CREATININE 1.30* 1.04 0.85 0.89 1.07  CALCIUM  8.9 9.0 8.5* 8.7* 8.7*   Liver Function Tests: Recent Labs  Lab 11/13/23 1104 11/14/23 0359  AST 96* 97*  ALT 75* 78*  ALKPHOS 170* 165*  BILITOT 2.9* 2.9*  PROT 6.6 6.2*  ALBUMIN 4.1 3.6   Recent Labs  Lab 11/13/23 1404  LIPASE 96*   No results for input(s): AMMONIA in the last 168 hours. CBC: Recent Labs  Lab 11/13/23 1104 11/14/23 0359  WBC 6.6 6.8  NEUTROABS 4.9  --   HGB 16.7 15.6  HCT 49.2 46.3  MCV 87.1 88.4  PLT 204 163   Cardiac Enzymes: No results for input(s): CKTOTAL, CKMB, CKMBINDEX, TROPONINI in the last 168 hours. BNP: Invalid input(s): POCBNP CBG: Recent Labs  Lab 11/15/23 0539 11/15/23 2108 11/16/23 0454 11/17/23 0608 11/18/23 0457  GLUCAP 130* 154* 111* 126* 129*   D-Dimer No results for input(s): DDIMER in the last 72 hours. Hgb A1c Recent Labs    11/15/23 1056  HGBA1C 6.6*   Lipid Profile No results for input(s): CHOL, HDL, LDLCALC, TRIG, CHOLHDL, LDLDIRECT in the last 72 hours. Thyroid  function studies Recent Labs    11/15/23 1024  TSH 4.574*   Anemia work up No results for input(s): VITAMINB12, FOLATE, FERRITIN, TIBC, IRON, RETICCTPCT in the last 72 hours. Urinalysis    Component Value Date/Time   COLORURINE AMBER (A) 11/13/2023 1701   APPEARANCEUR CLEAR 11/13/2023 1701   LABSPEC 1.045 (H) 11/13/2023 1701   PHURINE 6.0 11/13/2023 1701   GLUCOSEU >=500 (A) 11/13/2023 1701   HGBUR NEGATIVE 11/13/2023 1701   BILIRUBINUR NEGATIVE 11/13/2023 1701   BILIRUBINUR Negative 11/24/2015 1545   KETONESUR NEGATIVE 11/13/2023 1701   PROTEINUR NEGATIVE 11/13/2023 1701   UROBILINOGEN 1.0 11/24/2015 1545   UROBILINOGEN 0.2 08/10/2014 1545   NITRITE NEGATIVE 11/13/2023 1701   LEUKOCYTESUR NEGATIVE 11/13/2023 1701   Sepsis Labs Recent Labs  Lab  11/13/23 1104 11/14/23 0359  WBC 6.6  6.8   Microbiology No results found for this or any previous visit (from the past 240 hours).   Time coordinating discharge: Over 35 minutes  SIGNED:   Erle Odell Castor, MD  [facility] 11/18/2023, 7:09 AM Pager   If 7PM-7AM, please contact night-coverage [email or web address] Password [email or web address]

## 2023-11-20 ENCOUNTER — Other Ambulatory Visit: Payer: Self-pay

## 2023-11-20 ENCOUNTER — Telehealth: Payer: Self-pay

## 2023-11-20 DIAGNOSIS — K7460 Unspecified cirrhosis of liver: Secondary | ICD-10-CM

## 2023-11-20 DIAGNOSIS — R7989 Other specified abnormal findings of blood chemistry: Secondary | ICD-10-CM

## 2023-11-20 NOTE — Telephone Encounter (Signed)
-----   Message from Greig Corti sent at 11/17/2023  6:12 PM EDT ----- Regarding: Office follow-up and follow-up labs Patient being discharged from the hospital today, ideally should have a follow-up with Dr. Albertus, if nothing available in the next 2 months then In pod a in a few weeks  Please call him on Monday with appointment time and then also will need follow-up labs in 10 to 14 days please check CBC with differential, venous ammonia, INR and c-Met orders under Dr. Pamula name Thank you

## 2023-11-20 NOTE — Transitions of Care (Post Inpatient/ED Visit) (Signed)
 11/20/2023  Name: Chad [name] MRN: [id number] DOB: 08/05/1955  Today's TOC FU Call Status: Today's TOC FU Call Status:: Successful TOC FU Call Completed TOC FU Call Complete Date: 11/20/23 Patient's Name and Date of Birth confirmed.  Transition Care Management Follow-up Telephone Call Date of Discharge: 11/18/23 Discharge Facility: [facility]) Type of Discharge: Inpatient Admission Primary Inpatient Discharge Diagnosis:: syncope and collapse How have you been since you were released from the hospital?: Better Any questions or concerns?: Yes Patient Questions/Concerns:: Patient states he is concerned that the doctors in the hospital took him off of his bumex  and potassium. Patient states he has congestive heart failure and he was concerned that his fluid level may increase.  patient reports taking a bumex  on yesterday. He reports having cramps in his sides and legs. Patient states he thinks he may be to dry. Patient Questions/Concerns Addressed: Other: (patients questin/ concerns addressed.)  Items Reviewed: Did you receive and understand the discharge instructions provided?: Yes Medications obtained,verified, and reconciled?: Yes (Medications Reviewed) Any new allergies since your discharge?: No Dietary orders reviewed?: Yes Type of Diet Ordered:: low salt heart healthy Do you have support at home?: Yes People in Home [RPT]: spouse Name of Support/Comfort Primary Source: Dr. Comer Major  Medications Reviewed Today: Medications Reviewed Today     Reviewed by Dinora Hemm E, RN (Registered Nurse) on 11/20/23 at 1342  Med List Status: <None>   Medication Order Taking? Sig Documenting Provider Last Dose Status Informant  albuterol  (VENTOLIN  HFA) 108 (90 Base) MCG/ACT inhaler [id number] Yes Inhale 2 puffs into the lungs every 6 (six) hours as needed for wheezing or shortness of breath. [provider]  Active Child, Pharmacy Records, Self  allopurinol  (ZYLOPRIM ) 100  MG tablet [id number] Yes Take 1 tablet (100 mg total) by mouth every other day. Rilla Baller, MD  Active Child, Pharmacy Records, Self  amLODipine  (NORVASC ) 2.5 MG tablet [id number] Yes Take 1 tablet (2.5 mg total) by mouth daily. Rilla Baller, MD  Active Child, Pharmacy Records, Self  aspirin  EC 81 MG tablet [id number] Yes Take 81 mg by mouth daily. [provider]  Active Pharmacy Records, Child, Self  atorvastatin  (LIPITOR) 20 MG tablet [id number] Yes Take 1 tablet (20 mg total) by mouth daily. Rilla Baller, MD  Active Child, Pharmacy Records, Self  carvedilol  (COREG ) 3.125 MG tablet [id number] Yes Take 3.125 mg by mouth 2 (two) times daily with a meal. [provider]  Active Self, Child, Pharmacy Records  citalopram  (CELEXA ) 20 MG tablet [id number] Yes Take 1 tablet (20 mg total) by mouth daily. Rilla Baller, MD  Active Child, Pharmacy Records, Self  colchicine  0.6 MG tablet [id number] Yes Take 1 tablet (0.6 mg total) by mouth daily as needed (gout flare). Rilla Baller, MD  Active Child, Pharmacy Records, Self  dapagliflozin  propanediol (FARXIGA ) 10 MG TABS tablet [id number] Yes Take 10 mg by mouth daily before breakfast. Rilla Baller, MD  Active Pharmacy Records, Child, Self  dexlansoprazole  (DEXILANT ) 60 MG capsule [id number] Yes Take 1 capsule (60 mg total) by mouth daily. [place], PA-C  Active Child, Pharmacy Records, Self  famotidine  (PEPCID ) 20 MG tablet [id number] Yes Take 1 tablet (20 mg total) by mouth at bedtime. [place], PA-C  Active Child, Pharmacy Records, Self  Ferrous Sulfate  27 MG TABS [id number] Yes Take 1 tablet by mouth daily with breakfast. [provider]  Active Self, Child, Pharmacy Records  fluticasone  (FLONASE ) 50 MCG/ACT nasal spray [id number] Yes Place 2 sprays into  both nostrils daily. Rilla Baller, MD  Active Child, Pharmacy Records, Self  lactulose  (CHRONULAC ) 10 GM/15ML solution [id number] Yes Take 30 mLs (20  g total) by mouth 2 (two) times daily. Odell Celinda Balo, MD  Active   levothyroxine  (SYNTHROID ) 137 MCG tablet [id number] Yes Take 1 tablet (137 mcg total) by mouth daily before breakfast. TAKE ONE TAB BY MOUTH ONCE DAILY. TAKE ON AN EMPTY STOMACH WITH A GLASS OF WATER ATLEAST 30-60 MINUTES BEFORE OFILIA Rilla Baller, MD  Active Child, Pharmacy Records, Self  magnesium  oxide (MAG-OX) 400 (240 Mg) MG tablet [id number] Yes Take 400 mg by mouth. [provider]  Active Self, Child, Pharmacy Records  metoCLOPramide  (REGLAN ) 5 MG tablet [id number] Yes TAKE ONE TABLET (5 MG TOTAL) BY MOUTH EVERY EIGHT HOURS AS NEEDED FOR NAUSEA. [place], PA-C  Active Child, Pharmacy Records, Self  Multiple Vitamins-Minerals (ONE-A-DAY MENS 50+) TABS [id number] Yes Take 1 tablet by mouth in the morning. [provider]  Active Self, Child, Pharmacy Records  nitroGLYCERIN  (NITROSTAT ) 0.4 MG SL tablet [id number] Yes Place 1 tablet (0.4 mg total) under the tongue every 5 (five) minutes as needed for chest pain. [place] LABOR, FNP  Active Pharmacy Records, Child, Self  Psyllium (METAMUCIL 4 IN 1 FIBER) 55.6 % POWD [id number]  Take 1 Dose by mouth in the morning.  Patient not taking: Reported on 11/20/2023   [provider]  Active Self, Child, Pharmacy Records  rifaximin  (XIFAXAN ) 550 MG TABS tablet [id number] Yes Take 1 tablet (550 mg total) by mouth 2 (two) times daily. Odell Celinda Balo, MD  Active   Semaglutide , 2 MG/DOSE, (OZEMPIC , 2 MG/DOSE,) 8 MG/3ML SOPN [id number]  Inject 2 mg into the skin once a week.  Patient not taking: Reported on 11/20/2023   Rilla Baller, MD  Active Child, Pharmacy Records, Self  spironolactone  (ALDACTONE ) 25 MG tablet [id number] Yes Take 25 mg by mouth daily. [provider]  Active Pharmacy Records, Child, Self  vitamin B-12 (CYANOCOBALAMIN ) 500 MCG tablet [id number] Yes Take 1 tablet (500 mcg total) by mouth daily. Rilla Baller, MD   Active Pharmacy Records, Child, Self  vitamin E  400 UNIT capsule [id number] Yes Take 400 Units by mouth in the morning. [provider]  Active Pharmacy Records, Child, Self            Home Care and Equipment/Supplies: Were Home Health Services Ordered?: Yes Name of Home Health Agency:: [facility] Has Agency set up a time to come to your home?: No (patient states he does not feel he needs home health . He states he is able to do everything for himself and states his energy level has increased. He states he feels much better.) EMR reviewed for Home Health Orders:  (patient states his wife received a call from the home health agency however he was still in the hospital at the time.) Any new equipment or medical supplies ordered?: Yes (patient states rolling walker delivered to hospital room.) Name of Medical supply agency?: patient unsure of medical supply company Were you able to get the equipment/medical supplies?: Yes Do you have any questions related to the use of the equipment/supplies?: No  Functional Questionnaire: Do you need assistance with bathing/showering or dressing?: No Do you need assistance with meal preparation?: No Do you need assistance with eating?: No Do you have difficulty maintaining continence: No Do you need assistance with getting out of bed/getting out of a chair/moving?: No Do you have difficulty managing or taking your  medications?: No  Follow up appointments reviewed: PCP Follow-up appointment confirmed?: Yes Date of PCP follow-up appointment?: 11/24/23 Follow-up Provider: Dr.Javier Windsor [facility] Follow-up appointment confirmed?: Yes Date of Specialist follow-up appointment?: 12/06/23 Follow-Up Specialty Provider:: Dr. Merla Do you need transportation to your follow-up appointment?: No Do you understand care options if your condition(s) worsen?: Yes-patient verbalized understanding  SDOH Interventions Today     Flowsheet Row Most Recent Value  SDOH Interventions   Food Insecurity Interventions Intervention Not Indicated  Housing Interventions Intervention Not Indicated  Transportation Interventions Intervention Not Indicated  Utilities Interventions Intervention Not Indicated   Discussed and offered 30 day TOC program.  Patient verbally agreed.  The patient has been provided with contact information for the care management team and has been advised to call with any health -related questions or concerns.  The patient verbalized understanding with current plan of care.  The patient is directed to their insurance card regarding availability of benefits coverage.    Arvin Seip RN, BSN, CCM [employer], Population Health Case Manager Phone: [phone]

## 2023-11-20 NOTE — Telephone Encounter (Signed)
 Lab orders in epic. Pt scheduled to see Dr. Albertus 01/09/24@3 :40pm. Pt notified via mychart of need for labs in 10-14 days and of appt.

## 2023-11-20 NOTE — Patient Instructions (Signed)
 Visit Information  Thank you for taking time to visit with me today. Please don't hesitate to contact me if I can be of assistance to you before our next scheduled telephone appointment.  Our next appointment is by telephone on 11/28/23 at 3 pm  Following is a copy of your care plan:   Goals Addressed             This Visit's Progress    VBCI Transitions of Care (TOC) Care Plan       Problems:  Recent Hospitalization for treatment of syncope and collapse Home Health services barrier: patient states he does not need home health services and Knowledge Deficit Related to concerns related to medications bumex  and potassium being discontinued while in the hospital  Patient concerned he is going to gain fluid due to his history of CHF.     Goal:  Over the next 30 days, the patient will not experience hospital readmission  Interventions:  Transitions of Care: Doctor Visits  - discussed the importance of doctor visits Arranged PCP follow-up within 7 days (Care Guide Scheduled) - scheduled by care guide for 11/24/23 at 11:30 am Patient provided contact phone number for [facility]. Advised to call if he feels that home health PT services are not needed Patient advised to call his primary provider today regarding his concerns about the discontinuation of his bumex  and potassium. Advised to report ongoing cramping that he is experiencing.  Advised to continue to weigh daily and record.  Assessed for SDOH needs Advised to use ambulatory device as recommended Assessed current weight Advised to notify provider for any ongoing or new symptoms Assessed current weight and blood pressure Assessed for falls- fall precautions discussed.     Patient Self Care Activities:  Attend all scheduled provider appointments Call pharmacy for medication refills 3-7 days in advance of running out of medications Call provider office for new concerns or questions  Notify RN Care Manager of TOC call  rescheduling needs Take medications as prescribed   Monitor weight and blood pressure regularly and record. Keep follow up appointment with primary care provider for 11/24/23 at 11;30 am Use ambulatory device as recommended.  Call and notify provider of concerns regarding discontinuation of bumex / potassium and report ongoing cramping symptoms.   Plan:  Telephone follow up appointment with care management team member scheduled for:  11/28/23 at 3 pm The patient has been provided with contact information for the care management team and has been advised to call with any health related questions or concerns.          Goals Addressed             This Visit's Progress    VBCI Transitions of Care (TOC) Care Plan       Problems:  Recent Hospitalization for treatment of syncope and collapse Home Health services barrier: patient states he does not need home health services and Knowledge Deficit Related to concerns related to medications bumex  and potassium being discontinued while in the hospital  Patient concerned he is going to gain fluid due to his history of CHF.     Goal:  Over the next 30 days, the patient will not experience hospital readmission  Interventions:  Transitions of Care: Doctor Visits  - discussed the importance of doctor visits Arranged PCP follow-up within 7 days (Care Guide Scheduled) - scheduled by care guide for 11/24/23 at 11:30 am Patient provided contact phone number for [facility]. Advised to call  if he feels that home health PT services are not needed Patient advised to call his primary provider today regarding his concerns about the discontinuation of his bumex  and potassium. Advised to report ongoing cramping that he is experiencing.  Advised to continue to weigh daily and record.  Assessed for SDOH needs Advised to use ambulatory device as recommended Assessed current weight Advised to notify provider for any ongoing or new symptoms Assessed  current weight and blood pressure Assessed for falls- fall precautions discussed.     Patient Self Care Activities:  Attend all scheduled provider appointments Call pharmacy for medication refills 3-7 days in advance of running out of medications Call provider office for new concerns or questions  Notify RN Care Manager of TOC call rescheduling needs Take medications as prescribed   Monitor weight and blood pressure regularly and record. Keep follow up appointment with primary care provider for 11/24/23 at 11;30 am Use ambulatory device as recommended.  Call and notify provider of concerns regarding discontinuation of bumex / potassium and report ongoing cramping symptoms.   Plan:  Telephone follow up appointment with care management team member scheduled for:  11/28/23 at 3 pm The patient has been provided with contact information for the care management team and has been advised to call with any health related questions or concerns.         Patient verbalizes understanding of instructions and care plan provided today and agrees to view in MyChart. Active MyChart status and patient understanding of how to access instructions and care plan via MyChart confirmed with patient.     The patient has been provided with contact information for the care management team and has been advised to call with any health related questions or concerns.   Please call the care guide team at [phone] if you need to cancel or reschedule your appointment.   Please call the Suicide and Crisis Lifeline: 988 call the [place]  National Suicide Prevention Lifeline: 2673312916 or TTY: (507)631-4103 TTY (551)852-7091) to talk to a trained counselor call 1-800-273-TALK (toll free, 24 hour hotline) if you are experiencing a Mental Health or Behavioral Health Crisis or need someone to talk to.  Arvin Seip RN, BSN, CCM [employer], Population Health Case Manager Phone:  [phone]

## 2023-11-21 MED ORDER — LACTULOSE 10 GM/15ML PO SOLN: 20.0000 g | Freq: Two times a day (BID) | ORAL | 1 refills | Status: DC

## 2023-11-21 NOTE — Addendum Note (Signed)
 Addended by: RILLA BALLER on: 11/21/2023 12:01 PM   Modules accepted: Orders

## 2023-11-21 NOTE — Telephone Encounter (Signed)
 Spoke with pt at wife's visit.  He requests lactulose  refill - this has been very helpful for mentation and generalized well being.

## 2023-11-24 ENCOUNTER — Other Ambulatory Visit (HOSPITAL_COMMUNITY): Payer: Self-pay

## 2023-11-24 ENCOUNTER — Encounter: Payer: Self-pay | Admitting: Family Medicine

## 2023-11-24 ENCOUNTER — Ambulatory Visit (INDEPENDENT_AMBULATORY_CARE_PROVIDER_SITE_OTHER): Admitting: Family Medicine

## 2023-11-24 ENCOUNTER — Telehealth: Payer: Self-pay

## 2023-11-24 VITALS — BP 120/66 | HR 71 | Temp 97.7°F | Ht 69.0 in | Wt 255.1 lb

## 2023-11-24 DIAGNOSIS — I5030 Unspecified diastolic (congestive) heart failure: Secondary | ICD-10-CM

## 2023-11-24 DIAGNOSIS — Z7985 Long-term (current) use of injectable non-insulin antidiabetic drugs: Secondary | ICD-10-CM

## 2023-11-24 DIAGNOSIS — K7581 Nonalcoholic steatohepatitis (NASH): Secondary | ICD-10-CM | POA: Diagnosis not present

## 2023-11-24 DIAGNOSIS — E1143 Type 2 diabetes mellitus with diabetic autonomic (poly)neuropathy: Secondary | ICD-10-CM | POA: Diagnosis not present

## 2023-11-24 DIAGNOSIS — E1169 Type 2 diabetes mellitus with other specified complication: Secondary | ICD-10-CM | POA: Diagnosis not present

## 2023-11-24 DIAGNOSIS — K3184 Gastroparesis: Secondary | ICD-10-CM

## 2023-11-24 DIAGNOSIS — K7681 Hepatopulmonary syndrome: Secondary | ICD-10-CM

## 2023-11-24 DIAGNOSIS — Z7984 Long term (current) use of oral hypoglycemic drugs: Secondary | ICD-10-CM | POA: Diagnosis not present

## 2023-11-24 DIAGNOSIS — K7469 Other cirrhosis of liver: Secondary | ICD-10-CM | POA: Diagnosis not present

## 2023-11-24 DIAGNOSIS — I5032 Chronic diastolic (congestive) heart failure: Secondary | ICD-10-CM

## 2023-11-24 MED ORDER — RIFAXIMIN 550 MG PO TABS: 550.0000 mg | ORAL_TABLET | Freq: Two times a day (BID) | ORAL | 0 refills | Status: DC

## 2023-11-24 MED ORDER — RIFAXIMIN 550 MG PO TABS
550.0000 mg | ORAL_TABLET | Freq: Two times a day (BID) | ORAL | 0 refills | Status: DC
  Filled 2023-11-24 – 2023-11-27 (×2): qty 42, 21d supply, fill #0

## 2023-11-24 NOTE — Telephone Encounter (Signed)
 Pharmacy Patient Advocate Encounter   Received notification from Onbase that prior authorization for Xifaxan  550 mg tabs is required/requested.   Insurance verification completed.   The patient is insured through [employer].   Per test claim: PA required; PA submitted to above mentioned insurance via Latent Key/confirmation #/EOC [id number] Status is pending

## 2023-11-24 NOTE — Patient Instructions (Addendum)
 Check with pharmacy if rifaximin  will be covered.  Check on dose of potassium.  Good to see you today  Return as needed  Keep appointment next month.  Schedule labs for Wednesday next week (Dr Pamula labs).

## 2023-11-24 NOTE — Progress Notes (Signed)
 Ph: [phone] Fax: [phone]   Patient ID: Chad [name], male    DOB: Jan 04, 1956, 68 y.o.   MRN: [id number]  This visit was conducted in person.  BP 120/66   Pulse 71   Temp 97.7 F (36.5 C) (Oral)   Ht 5' 9 (1.753 m)   Wt 255 lb 2 oz (115.7 kg)   SpO2 97%   BMI 37.68 kg/m    CC: hosp f/u visit  Subjective:   HPI: Chad [name] is a 68 y.o. male presenting on 11/24/2023 for Hospitalization Follow-up (Admitted on 11/13/23 at [facility], dx precordial chest pain; syncope; transaminitis. Pt accompanied by wife, Chad [name]. )   [place]  has a past medical history of Abnormal drug screen, Acute diverticulitis (08/15/2014), Allergy, Anxiety, Arthritis, Balanoposthitis (01/29/2015), Bone spur, Bulging lumbar disc, Central retinal vein occlusion with macular edema of left eye (HCC) (10/17/2013), Cirrhosis (HCC), Coronary artery disease, COVID-19 (10/29/2019), COVID-19 virus infection (10/29/2019), Diabetes mellitus without complication (HCC), Diastolic CHF, chronic (HCC) (04/02/2012), Diverticulosis, Gastric bypass status for obesity (1985), Gastritis (08/31/2015), GERD (gastroesophageal reflux disease), Hepatic steatosis, History of diabetes mellitus (1990s), HLD (hyperlipidemia), HTN (hypertension), Hyperglycemia glucose over 300 in last 24 hrs (07/12/2016), Hyperplastic colon polyp (2008), Hypothyroid, Hypoxia (11/15/2023), Internal hemorrhoids, Morbid obesity (HCC), Narrowing of lumbar spine, OSA (obstructive sleep apnea), Otomycosis of right ear (07/06/2011), Primary localized osteoarthritis of left knee (06/29/2016), PVC (premature ventricular contraction), Right ear pain, Seasonal allergies, Sensorineural hearing loss, bilateral, Splenomegaly, Thrombocytopenia (06/10/2015), Tinnitus, and Trifascicular block  RBBB/LPFB/1AVB.   Recent hospitalization for episode of worsening dyspnea, chest pain, with several episodes of syncope/near syncope. Received nitroglycerin  with benefit. He had recently  been treated for CAP with augmentin  and azithromycin , CXR returned negative for pneumonia.  Hospital records reviewed. Med rec performed.  There was concern for liver disease contributing to symptoms. ABG showed large AA gradient suspicious for mild hepatopulmonary syndrome. Echocardiogram with positive bubble study. Discharged with home O2 - he states this was not done.  Cardiology didn't recommend ischemic evaluation - troponins normal, EKG stable RBBB.  Concern for hepatic encephalopathy - started on lactulose  and rifamixin, aldactone  continued.   CT angio chest abd pelvis - without acute pathology, did show scattered calcifications to thoracoabdominal aorta but without aneurysm or dissection, chronic R adrenal myelolipoma, chronic liver cirrhosis with splenomegaly.   Bumex  was stopped, cardiology restarted this yesterday after 8 lb weight gain this week.  Goal dry weight is 251 lbs.   He is taking lactulose  30mL twice daily, averaging 3 stools/day. Notes green stools.  Was not discharged with rifaximin  - was not available at transitional care pharmacy on discharge.   Home health  planned set up with [facility] - pt declined.  Other follow up appointments scheduled: cards 12/06/2023, GI Dr Albertus 01/09/2024,  ______________________________________________________________________ Hospital admission: 11/13/2023 Hospital discharge: 11/18/2023 TCM f/u phone call:  performed on 11/20/2023  Recommendations for Outpatient Follow-up:  Follow up with GI in 1-2 weeks Please obtain BMP/CBC in one week  Discharge Diagnoses:  Principal Problem:   Syncope and collapse Active Problems:   Diabetes mellitus type 2 with retinopathy (HCC)   HTN (hypertension)   Hyperlipidemia associated with type 2 diabetes mellitus (HCC)   GERD (gastroesophageal reflux disease)   Hypothyroidism   OSA (obstructive sleep apnea)   Severe obesity (BMI 35.0-39.9) with comorbidity (HCC)   CAD (coronary artery disease),  native coronary artery   Portal hypertension (HCC)   Constipation   Hypoxia   HFrEF (heart failure with  reduced ejection fraction) (HCC)   Elevated troponin   Hepatopulmonary syndrome (HCC)     Relevant past medical, surgical, family and social history reviewed and updated as indicated. Interim medical history since our last visit reviewed. Allergies and medications reviewed and updated. Outpatient Medications Prior to Visit  Medication Sig Dispense Refill   albuterol  (VENTOLIN  HFA) 108 (90 Base) MCG/ACT inhaler Inhale 2 puffs into the lungs every 6 (six) hours as needed for wheezing or shortness of breath.     allopurinol  (ZYLOPRIM ) 100 MG tablet Take 1 tablet (100 mg total) by mouth every other day. 45 tablet 3   amLODipine  (NORVASC ) 2.5 MG tablet Take 1 tablet (2.5 mg total) by mouth daily. 90 tablet 3   aspirin  EC 81 MG tablet Take 81 mg by mouth daily.     atorvastatin  (LIPITOR) 20 MG tablet Take 1 tablet (20 mg total) by mouth daily. 90 tablet 3   carvedilol  (COREG ) 3.125 MG tablet Take 3.125 mg by mouth 2 (two) times daily with a meal.     citalopram  (CELEXA ) 20 MG tablet Take 1 tablet (20 mg total) by mouth daily. 90 tablet 3   colchicine  0.6 MG tablet Take 1 tablet (0.6 mg total) by mouth daily as needed (gout flare). 30 tablet 3   dapagliflozin  propanediol (FARXIGA ) 10 MG TABS tablet Take 10 mg by mouth daily before breakfast.     dexlansoprazole  (DEXILANT ) 60 MG capsule Take 1 capsule (60 mg total) by mouth daily. 90 capsule 3   famotidine  (PEPCID ) 20 MG tablet Take 1 tablet (20 mg total) by mouth at bedtime. 90 tablet 3   Ferrous Sulfate  27 MG TABS Take 1 tablet by mouth daily with breakfast.     fluticasone  (FLONASE ) 50 MCG/ACT nasal spray Place 2 sprays into both nostrils daily. 16 g 12   lactulose  (CHRONULAC ) 10 GM/15ML solution Take 30 mLs (20 g total) by mouth 2 (two) times daily. 946 mL 1   levothyroxine  (SYNTHROID ) 137 MCG tablet Take 1 tablet (137 mcg total) by mouth  daily before breakfast. TAKE ONE TAB BY MOUTH ONCE DAILY. TAKE ON AN EMPTY STOMACH WITH A GLASS OF WATER ATLEAST 30-60 MINUTES BEFORE BREAKFAST 90 tablet 3   magnesium  oxide (MAG-OX) 400 (240 Mg) MG tablet Take 400 mg by mouth.     metoCLOPramide  (REGLAN ) 5 MG tablet TAKE ONE TABLET (5 MG TOTAL) BY MOUTH EVERY EIGHT HOURS AS NEEDED FOR NAUSEA. 90 tablet 1   Multiple Vitamins-Minerals (ONE-A-DAY MENS 50+) TABS Take 1 tablet by mouth in the morning.     nitroGLYCERIN  (NITROSTAT ) 0.4 MG SL tablet Place 1 tablet (0.4 mg total) under the tongue every 5 (five) minutes as needed for chest pain. 60 tablet 3   potassium chloride  SA (KLOR-CON  M) 20 MEQ tablet Take 1 tablet (20 mEq total) by mouth daily. With bumex      Psyllium (METAMUCIL 4 IN 1 FIBER) 55.6 % POWD Take 1 Dose by mouth in the morning.     Semaglutide , 2 MG/DOSE, (OZEMPIC , 2 MG/DOSE,) 8 MG/3ML SOPN Inject 2 mg into the skin once a week. 9 mL 3   spironolactone  (ALDACTONE ) 25 MG tablet Take 25 mg by mouth daily.     vitamin B-12 (CYANOCOBALAMIN ) 500 MCG tablet Take 1 tablet (500 mcg total) by mouth daily.     vitamin E  400 UNIT capsule Take 400 Units by mouth in the morning.     bumetanide  (BUMEX ) 1 MG tablet Take 1 mg by mouth daily.  POTASSIUM PO Take by mouth.     rifaximin  (XIFAXAN ) 550 MG TABS tablet Take 1 tablet (550 mg total) by mouth 2 (two) times daily. 42 tablet 0   bumetanide  (BUMEX ) 1 MG tablet Take 2 tablets (2 mg total) by mouth daily.     No facility-administered medications prior to visit.     Per HPI unless specifically indicated in ROS section below Review of Systems  Objective:  BP 120/66   Pulse 71   Temp 97.7 F (36.5 C) (Oral)   Ht 5' 9 (1.753 m)   Wt 255 lb 2 oz (115.7 kg)   SpO2 97%   BMI 37.68 kg/m   Wt Readings from Last 3 Encounters:  11/24/23 255 lb 2 oz (115.7 kg)  11/18/23 253 lb 4.8 oz (114.9 kg)  11/07/23 257 lb 8 oz (116.8 kg)      Physical Exam Vitals and nursing note reviewed.   Constitutional:      Appearance: Normal appearance. He is not ill-appearing.  HENT:     Head: Normocephalic and atraumatic.     Mouth/Throat:     Mouth: Mucous membranes are moist.     Pharynx: Oropharynx is clear. No oropharyngeal exudate or posterior oropharyngeal erythema.     Comments: S/u UPPP Eyes:     Extraocular Movements: Extraocular movements intact.     Pupils: Pupils are equal, round, and reactive to light.  Cardiovascular:     Rate and Rhythm: Normal rate and regular rhythm.     Pulses: Normal pulses.     Heart sounds: Normal heart sounds. No murmur heard. Pulmonary:     Effort: Pulmonary effort is normal. No respiratory distress.     Breath sounds: Normal breath sounds. No wheezing, rhonchi or rales.  Abdominal:     General: Bowel sounds are normal. There is distension.     Palpations: Abdomen is soft. There is no mass.     Tenderness: There is no abdominal tenderness. There is no guarding or rebound.     Hernia: No hernia is present.  Musculoskeletal:     Cervical back: Normal range of motion and neck supple.     Right lower leg: No edema.     Left lower leg: No edema.  Skin:    General: Skin is warm and dry.     Findings: No rash.  Neurological:     Mental Status: He is alert.  Psychiatric:        Mood and Affect: Mood normal.        Behavior: Behavior normal.       Lab Results  Component Value Date   NA 136 11/18/2023   CL 105 11/18/2023   K 4.0 11/18/2023   CO2 23 11/18/2023   BUN 11 11/18/2023   CREATININE 1.07 11/18/2023   GFRNONAA >60 11/18/2023   CALCIUM  8.7 (L) 11/18/2023   PHOS 3.5 01/30/2018   ALBUMIN 3.6 11/14/2023   GLUCOSE 110 (H) 11/18/2023    Lab Results  Component Value Date   WBC 6.8 11/14/2023   HGB 15.6 11/14/2023   HCT 46.3 11/14/2023   MCV 88.4 11/14/2023   PLT 163 11/14/2023   Lab Results  Component Value Date   ALT 78 (H) 11/14/2023   AST 97 (H) 11/14/2023   GGT 176 (H) 08/03/2015   ALKPHOS 165 (H) 11/14/2023    BILITOT 2.9 (H) 11/14/2023    Lab Results  Component Value Date   TSH 4.574 (H) 11/15/2023    Echocardiogram  10/2023: EF 55-60%, mild LVH, RV apical hypokinesis, reduced systolic RV function, mildly enlarged RV. Bubble study - delayed positive suggesting pulm AVMs   Assessment & Plan:   Problem List Items Addressed This Visit     Chronic diastolic CHF (congestive heart failure) (HCC)   Appreciate cards care - bumex  was restarted by [facility] team after 8 lb weight gain - back on 2mg  daily with extra PRN weight gain.       Relevant Medications   bumetanide  (BUMEX ) 1 MG tablet   Diabetic gastroparesis (HCC)   Has reglan   to use PRN      Liver cirrhosis secondary to NASH [facility])   Appreciate GI care, followed for known NASH cirrhosis, now with concern for hepatopulmonary syndrome based on testing done in hospital.  I don't see where ammonium was checked. Lactulose  was started and pt feels overall better with this medicine. Continue new lactulose  for goal 2-3 soft bowel movements daily.  Will resubmit rifaximin  Rx to [facility] pharmacy.  He continues spironolactone , bumex  recently restarted.  Keep GI f/u next month.  Recent LFTs showing transaminitis - rpt GI labs schedule ordered next week.   MELD 3.0: 14 at 11/16/2023  3:45 AM MELD-Na: 15 at 11/16/2023  3:45 AM Calculated from: Serum Creatinine: 0.85 mg/dL (Using min of 1 mg/dL) at 0/74/7974  6:54 AM Serum Sodium: 134 mmol/L at 11/16/2023  3:45 AM Total Bilirubin: 2.9 mg/dL at 0/76/7974  6:40 AM Serum Albumin: 3.6 g/dL (Using max of 3.5 g/dL) at 0/76/7974  6:40 AM INR(ratio): 1.2 at 11/14/2023  2:47 PM Age at listing (hypothetical): 51 years Sex: Male at 11/16/2023  3:45 AM       Type 2 diabetes mellitus with other specified complication (HCC)   Chronic, stable period on farxiga  and ozempic  2mg  weekly. Metformin  intolerance.       (HFpEF) heart failure with preserved ejection fraction (HCC)   Echo showing R heart dysfunction and  enlarged RV.       Relevant Medications   bumetanide  (BUMEX ) 1 MG tablet   Hepatopulmonary syndrome (HCC) - Primary   ABG with large AA gradient suggesting inefficient oxygenation in lungs, concern coming for hepatopulmonary syndrome. This could explain his symptoms of dyspnea and platypnea.  Echocardiogram with positive bubble study suggesting intracardiac vs pulmonary shunt (pulm AVM?).  He was not sent home with supplemental oxygen.  Will offer ambulatory pulse ox to evaluate for orthodeoxia to determine need for supplemental oxygen.      Relevant Medications   bumetanide  (BUMEX ) 1 MG tablet   Other Visit Diagnoses       Long-term (current) use of injectable non-insulin  antidiabetic drugs         Long term current use of oral hypoglycemic drug            Meds ordered this encounter  Medications   DISCONTD: rifaximin  (XIFAXAN ) 550 MG TABS tablet    Sig: Take 1 tablet (550 mg total) by mouth 2 (two) times daily.    Dispense:  42 tablet    Refill:  0   rifaximin  (XIFAXAN ) 550 MG TABS tablet    Sig: Take 1 tablet (550 mg total) by mouth 2 (two) times daily.    Dispense:  42 tablet    Refill:  0    No orders of the defined types were placed in this encounter.   Patient Instructions  Check with pharmacy if rifaximin  will be covered.  Check on dose of potassium.  Good to see  you today  Return as needed  Keep appointment next month.  Schedule labs for Wednesday next week (Dr Pamula labs).   Follow up plan: Return if symptoms worsen or fail to improve.  Anton Blas, MD

## 2023-11-24 NOTE — Telephone Encounter (Signed)
 Pharmacy Patient Advocate Encounter  Received notification from [employer] that Prior Authorization for Xifaxan  550 mg tabs has been APPROVED from 02/22/23 to 02/21/24. Ran test claim, Copay is $0.00. This test claim was processed through [facility]- copay amounts may vary at other pharmacies due to pharmacy/plan contracts, or as the patient moves through the different stages of their insurance plan.   PA #/Case ID/Reference #: # [id number]

## 2023-11-25 ENCOUNTER — Telehealth: Payer: Self-pay | Admitting: Family Medicine

## 2023-11-25 NOTE — Telephone Encounter (Signed)
 Please schedule nurse visit for ambulatory pulse ox indication hepatopulmonary syndrome.  Could he do this on Wed when he returns for lab visit?

## 2023-11-25 NOTE — Assessment & Plan Note (Signed)
 Chronic, stable period on farxiga  and ozempic  2mg  weekly. Metformin  intolerance.

## 2023-11-25 NOTE — Assessment & Plan Note (Signed)
 Appreciate cards care - bumex  was restarted by [facility] team after 8 lb weight gain - back on 2mg  daily with extra PRN weight gain.

## 2023-11-25 NOTE — Assessment & Plan Note (Signed)
 Has reglan   to use PRN

## 2023-11-25 NOTE — Assessment & Plan Note (Signed)
 Echo showing R heart dysfunction and enlarged RV.

## 2023-11-25 NOTE — Assessment & Plan Note (Addendum)
 ABG with large AA gradient suggesting inefficient oxygenation in lungs, concern coming for hepatopulmonary syndrome. This could explain his symptoms of dyspnea and platypnea.  Echocardiogram with positive bubble study suggesting intracardiac vs pulmonary shunt (pulm AVM?).  He was not sent home with supplemental oxygen.  Will offer ambulatory pulse ox to evaluate for orthodeoxia to determine need for supplemental oxygen.

## 2023-11-25 NOTE — Assessment & Plan Note (Addendum)
 Appreciate GI care, followed for known NASH cirrhosis, now with concern for hepatopulmonary syndrome based on testing done in hospital.  I don't see where ammonium was checked. Lactulose  was started and pt feels overall better with this medicine. Continue new lactulose  for goal 2-3 soft bowel movements daily.  Will resubmit rifaximin  Rx to [facility] pharmacy.  He continues spironolactone , bumex  recently restarted.  Keep GI f/u next month.  Recent LFTs showing transaminitis - rpt GI labs schedule ordered next week.   MELD 3.0: 14 at 11/16/2023  3:45 AM MELD-Na: 15 at 11/16/2023  3:45 AM Calculated from: Serum Creatinine: 0.85 mg/dL (Using min of 1 mg/dL) at 0/74/7974  6:54 AM Serum Sodium: 134 mmol/L at 11/16/2023  3:45 AM Total Bilirubin: 2.9 mg/dL at 0/76/7974  6:40 AM Serum Albumin: 3.6 g/dL (Using max of 3.5 g/dL) at 0/76/7974  6:40 AM INR(ratio): 1.2 at 11/14/2023  2:47 PM Age at listing (hypothetical): 75 years Sex: Male at 11/16/2023  3:45 AM

## 2023-11-27 ENCOUNTER — Other Ambulatory Visit (HOSPITAL_COMMUNITY): Payer: Self-pay

## 2023-11-27 ENCOUNTER — Telehealth (HOSPITAL_COMMUNITY): Payer: Self-pay

## 2023-11-27 NOTE — Telephone Encounter (Signed)
 Appointment has been added to nurse visit schedule. Left message for patient to call back so we can let him know he will have nurse visit same day right after lab.

## 2023-11-28 ENCOUNTER — Other Ambulatory Visit: Payer: Self-pay

## 2023-11-28 ENCOUNTER — Ambulatory Visit: Admitting: Dermatology

## 2023-11-28 NOTE — Telephone Encounter (Signed)
 Left message on voicemail, per dpr, notifying pt of NV tomorrow immediately after lab visit.

## 2023-11-28 NOTE — Patient Instructions (Signed)
 Visit Information  Thank you for taking time to visit with me today. Please don't hesitate to contact me if I can be of assistance to you before our next scheduled telephone appointment.  Our next appointment is by telephone on 12/06/23 at 3 pm  Following is a copy of your care plan:   Goals Addressed             This Visit's Progress    VBCI Transitions of Care (TOC) Care Plan       Problems:  Recent Hospitalization for treatment of syncope and collapse Home Health services barrier: patient states he does not need home health services and Knowledge Deficit Related to concerns related to medications bumex  and potassium being discontinued while in the hospital.  Wife states patient was restarted on his bumex  and potassium.     Goal:  Over the next 30 days, the patient will not experience hospital readmission  Interventions:  Transitions of Care: Confirmed patient had follow up visit with his primary care provider on 11/24/23 at 11:30 am.  Visit discussed with spouse.  Assessed for ongoing/ new symptoms Advised to continue to weigh daily and record.  Advised to use ambulatory device as recommended Advised to notify provider for any ongoing or new symptoms Assessed current weight and blood pressure Assessed for falls- fall precautions discussed.   Confirmed patient has follow up visit scheduled with gastroenterologist.    Patient Self Care Activities:  Call pharmacy for medication refills 3-7 days in advance of running out of medications Call provider office for new concerns or questions  Notify RN Care Manager of TOC call rescheduling needs Take medications as prescribed   Monitor weight and blood pressure regularly and record. Use ambulatory device as recommended.   Plan:  Telephone follow up appointment with care management team member scheduled for:  12/06/23 at 3 pm        Patient verbalizes understanding of instructions and care plan provided today and agrees to  view in MyChart. Active MyChart status and patient understanding of how to access instructions and care plan via MyChart confirmed with patient.     The patient has been provided with contact information for the care management team and has been advised to call with any health related questions or concerns.   Please call the care guide team at [phone] if you need to cancel or reschedule your appointment.   Please call the Suicide and Crisis Lifeline: 988 call the [place]  National Suicide Prevention Lifeline: 9104569733 or TTY: 913-282-1876 TTY 310 344 7861) to talk to a trained counselor call 1-800-273-TALK (toll free, 24 hour hotline) if you are experiencing a Mental Health or Behavioral Health Crisis or need someone to talk to.  Arvin Seip RN, BSN, CCM [employer], Population Health Case Manager Phone: [phone]

## 2023-11-28 NOTE — Transitions of Care (Post Inpatient/ED Visit) (Signed)
 Transition of Care week 2  Visit Note  11/28/2023  Name: Chad [name] MRN: [id number]          DOB: [birth year]  Situation: Patient enrolled in [facility] 30-day program. Visit completed with patients spouse, Katherine Lentsch by telephone.   Background: Admitted to the hospital from 11/13/23 to 11/18/23 for syncope and collapse     Past Medical History:  Diagnosis Date   Abnormal drug screen    innaprop negative for hydrocodone  09/2013, inapprop negative for hydrocodone  and tramadol  02/2014; inappropr negative hydrocodone  03/2015   Acute diverticulitis 08/15/2014   Allergy    seasonal allergies   Anxiety    on meds   Arthritis    both hips and knees; got shots in each hip in August (01/25/2013)   Balanoposthitis 01/29/2015   Bone spur    L4 L5   Bulging lumbar disc    Central retinal vein occlusion with macular edema of left eye (HCC) 10/17/2013   L eye 01/2017  Bulakowski - referred to retinologist 01/2017  CRVO with macular edema L eye planned treatment with Ozurdex  (dexamehthasone intravitreal implant) by Dr Jarold 02/2017   Cirrhosis (HCC)    Coronary artery disease    COVID-19 10/29/2019   10/2019 - s/p mAb infusion treatment      COVID-19 virus infection 10/29/2019   10/2019 - s/p mAb infusion treatment    Diabetes mellitus without complication (HCC)    type 2- on meds   Diastolic CHF, chronic (HCC) 04/02/2012   Diverticulosis    Gastric bypass status for obesity 1985   Gastritis 08/31/2015   with focal intestinal metaplasia   GERD (gastroesophageal reflux disease)    severe, h/o gastritis and GI bleed, per pt normal EGD at [facility] 2008   Hepatic steatosis    History of diabetes mellitus 1990s   with mild background retinopathy, resolved with weight loss   HLD (hyperlipidemia)    statin caused leg cramps   HTN (hypertension)    not on meds at this time (02/05/2020)   Hyperglycemia glucose over 300 in last 24 hrs 07/12/2016   Hyperplastic colon polyp 2008   Hypothyroid     on meds   Hypoxia 11/15/2023   Internal hemorrhoids    Morbid obesity (HCC)    Narrowing of lumbar spine    OSA (obstructive sleep apnea)    unable to use CPAP as of last try 2/2 h/o tracheostomy? weight loss 100lbs   Otomycosis of right ear 07/06/2011   Primary localized osteoarthritis of left knee 06/29/2016   PVC (premature ventricular contraction)    RBBB Infer axis   Right ear pain    s/p eval by ENT - thought TMJ referred pain and sent to oral surg for dental splint   Seasonal allergies    Sensorineural hearing loss, bilateral    no longer wears hearing aides   Splenomegaly    Thrombocytopenia 06/10/2015   Platelet count dropped to 73 post op day 2 after total knee    Tinnitus    due to sensorineural hearing loss R>L with ETD   Trifascicular block  RBBB/LPFB/1AVB     Assessment: Patient Reported Symptoms: Cognitive Cognitive Status: Alert and oriented to person, place, and time, Insightful and able to interpret abstract concepts, Normal speech and language skills      Neurological Neurological Review of Symptoms: No symptoms reported    HEENT HEENT Symptoms Reported: No symptoms reported      Cardiovascular Cardiovascular Symptoms Reported: No symptoms  reported    Respiratory Respiratory Symptoms Reported: No symptoms reported Additional Respiratory Details: wife denies patient having any new symptoms. she states he's had an ongoing cough for a while and his doctor his aware and has evaluated.    Endocrine Endocrine Symptoms Reported: No symptoms reported Is patient diabetic?: Yes Is patient checking blood sugars at home?: No    Gastrointestinal Gastrointestinal Symptoms Reported: No symptoms reported Additional Gastrointestinal Details: Wife states patient is doing, awesome.  She states patient was started on his new medication Rifaximin  for his stomach which finally got approved by his insurance.  wife states patient has labs scheduled for tomorrow for further  evalulation of his liver and has a follow up visit with the gastroenterologist on 01/09/24 and follow up with his primary care provider on 01/02/24. Wife denies having anymore episodes of dizziness or passing out. Gastrointestinal Management Strategies: Medication therapy (routine follow up)    Genitourinary Genitourinary Symptoms Reported: No symptoms reported    Integumentary Integumentary Symptoms Reported: No symptoms reported    Musculoskeletal Musculoskelatal Symptoms Reviewed: Unsteady gait Additional Musculoskeletal Details: wife states patient continues to use his walker as recommended. Denies any recent falls.        Psychosocial Psychosocial Symptoms Reported: Not assessed Additional Psychological Details: assessment completed with spouse.         There were no vitals filed for this visit.  Medications Reviewed Today     Reviewed by Ramiz Turpin E, RN (Registered Nurse) on 11/28/23 at 1425  Med List Status: <None>   Medication Order Taking? Sig Documenting Provider Last Dose Status Informant  albuterol  (VENTOLIN  HFA) 108 (90 Base) MCG/ACT inhaler [id number] Yes Inhale 2 puffs into the lungs every 6 (six) hours as needed for wheezing or shortness of breath. [provider]  Active Child, Pharmacy Records, Self  allopurinol  (ZYLOPRIM ) 100 MG tablet [id number] Yes Take 1 tablet (100 mg total) by mouth every other day. Rilla Baller, MD  Active Child, Pharmacy Records, Self  amLODipine  (NORVASC ) 2.5 MG tablet [id number] Yes Take 1 tablet (2.5 mg total) by mouth daily. Rilla Baller, MD  Active Child, Pharmacy Records, Self  aspirin  EC 81 MG tablet [id number] Yes Take 81 mg by mouth daily. [provider]  Active Pharmacy Records, Child, Self  atorvastatin  (LIPITOR) 20 MG tablet [id number] Yes Take 1 tablet (20 mg total) by mouth daily. Rilla Baller, MD  Active Child, Pharmacy Records, Self  bumetanide  (BUMEX ) 1 MG tablet [id number] Yes Take 2 tablets (2  mg total) by mouth daily. Rilla Baller, MD  Active   carvedilol  (COREG ) 3.125 MG tablet [id number] Yes Take 3.125 mg by mouth 2 (two) times daily with a meal. [provider]  Active Self, Child, Pharmacy Records  citalopram  (CELEXA ) 20 MG tablet [id number] Yes Take 1 tablet (20 mg total) by mouth daily. Rilla Baller, MD  Active Child, Pharmacy Records, Self  colchicine  0.6 MG tablet [id number] Yes Take 1 tablet (0.6 mg total) by mouth daily as needed (gout flare). Rilla Baller, MD  Active Child, Pharmacy Records, Self  dapagliflozin  propanediol (FARXIGA ) 10 MG TABS tablet [id number] Yes Take 10 mg by mouth daily before breakfast. Rilla Baller, MD  Active Pharmacy Records, Child, Self  dexlansoprazole  (DEXILANT ) 60 MG capsule [id number] Yes Take 1 capsule (60 mg total) by mouth daily. [place], PA-C  Active Child, Pharmacy Records, Self  famotidine  (PEPCID ) 20 MG tablet [id number] Yes Take 1 tablet (20 mg total) by mouth at bedtime. [place], PA-C  Active  Child, Pharmacy Records, Self  Ferrous Sulfate  27 MG TABS [id number] Yes Take 1 tablet by mouth daily with breakfast. [provider]  Active Self, Child, Pharmacy Records  fluticasone  (FLONASE ) 50 MCG/ACT nasal spray [id number] Yes Place 2 sprays into both nostrils daily. Rilla Baller, MD  Active Child, Pharmacy Records, Self  lactulose  (CHRONULAC ) 10 GM/15ML solution [id number] Yes Take 30 mLs (20 g total) by mouth 2 (two) times daily. Rilla Baller, MD  Active   levothyroxine  (SYNTHROID ) 137 MCG tablet [id number] Yes Take 1 tablet (137 mcg total) by mouth daily before breakfast. TAKE ONE TAB BY MOUTH ONCE DAILY. TAKE ON AN EMPTY STOMACH WITH A GLASS OF WATER ATLEAST 30-60 MINUTES BEFORE OFILIA Rilla Baller, MD  Active Child, Pharmacy Records, Self  magnesium  oxide (MAG-OX) 400 (240 Mg) MG tablet [id number] Yes Take 400 mg by mouth. [provider]  Active Self, Child, Pharmacy Records   metoCLOPramide  (REGLAN ) 5 MG tablet [id number] Yes TAKE ONE TABLET (5 MG TOTAL) BY MOUTH EVERY EIGHT HOURS AS NEEDED FOR NAUSEA. [place], PA-C  Active Child, Pharmacy Records, Self  Multiple Vitamins-Minerals (ONE-A-DAY MENS 50+) TABS [id number] Yes Take 1 tablet by mouth in the morning. [provider]  Active Self, Child, Pharmacy Records  nitroGLYCERIN  (NITROSTAT ) 0.4 MG SL tablet [id number] Yes Place 1 tablet (0.4 mg total) under the tongue every 5 (five) minutes as needed for chest pain. [place] LABOR, FNP  Active Pharmacy Records, Child, Self  potassium chloride  SA (KLOR-CON  M) 20 MEQ tablet [id number] Yes Take 1 tablet (20 mEq total) by mouth daily. With bumex  Rilla Baller, MD  Active   Psyllium (METAMUCIL 4 IN 1 FIBER) 55.6 % POWD [id number] Yes Take 1 Dose by mouth in the morning. [provider]  Active Self, Child, Pharmacy Records  rifaximin  (XIFAXAN ) 550 MG TABS tablet [id number] Yes Take 1 tablet (550 mg total) by mouth 2 (two) times daily. Rilla Baller, MD  Active   Semaglutide , 2 MG/DOSE, (OZEMPIC , 2 MG/DOSE,) 8 MG/3ML SOPN [id number] Yes Inject 2 mg into the skin once a week. Rilla Baller, MD  Active Child, Pharmacy Records, Self  spironolactone  (ALDACTONE ) 25 MG tablet [id number] Yes Take 25 mg by mouth daily. [provider]  Active Pharmacy Records, Child, Self  vitamin B-12 (CYANOCOBALAMIN ) 500 MCG tablet [id number] Yes Take 1 tablet (500 mcg total) by mouth daily. Rilla Baller, MD  Active Pharmacy Records, Child, Self  vitamin E  400 UNIT capsule [id number] Yes Take 400 Units by mouth in the morning. [provider]  Active Pharmacy Records, Child, Self            Goals Addressed             This Visit's Progress    VBCI Transitions of Care (TOC) Care Plan       Problems:  Recent Hospitalization for treatment of syncope and collapse Home Health services barrier: patient states he does not need home health services  and Knowledge Deficit Related to concerns related to medications bumex  and potassium being discontinued while in the hospital.  Wife states patient was restarted on his bumex  and potassium.     Goal:  Over the next 30 days, the patient will not experience hospital readmission  Interventions:  Transitions of Care: Confirmed patient had follow up visit with his primary care provider on 11/24/23 at 11:30 am.  Visit discussed with spouse.  Assessed for ongoing/ new symptoms Advised to continue to weigh daily and record.  Advised to use  ambulatory device as recommended Advised to notify provider for any ongoing or new symptoms Assessed current weight and blood pressure Assessed for falls- fall precautions discussed.   Confirmed patient has follow up visit scheduled with gastroenterologist.    Patient Self Care Activities:  Call pharmacy for medication refills 3-7 days in advance of running out of medications Call provider office for new concerns or questions  Notify RN Care Manager of TOC call rescheduling needs Take medications as prescribed   Monitor weight and blood pressure regularly and record. Use ambulatory device as recommended.   Plan:  Telephone follow up appointment with care management team member scheduled for:  12/06/23 at 3 pm         Recommendation:   Continue Current Plan of Care  Follow Up Plan:   Telephone follow-up in 1 week  Arvin Seip RN, BSN, CCM [place]  [facility], Population Health Case Manager Phone: [phone]

## 2023-11-29 ENCOUNTER — Ambulatory Visit

## 2023-11-29 ENCOUNTER — Other Ambulatory Visit

## 2023-11-30 ENCOUNTER — Other Ambulatory Visit

## 2023-11-30 ENCOUNTER — Ambulatory Visit: Payer: Self-pay | Admitting: Internal Medicine

## 2023-11-30 ENCOUNTER — Ambulatory Visit (INDEPENDENT_AMBULATORY_CARE_PROVIDER_SITE_OTHER)

## 2023-11-30 DIAGNOSIS — K7681 Hepatopulmonary syndrome: Secondary | ICD-10-CM | POA: Diagnosis not present

## 2023-11-30 DIAGNOSIS — R7989 Other specified abnormal findings of blood chemistry: Secondary | ICD-10-CM

## 2023-11-30 DIAGNOSIS — K7469 Other cirrhosis of liver: Secondary | ICD-10-CM | POA: Diagnosis not present

## 2023-11-30 DIAGNOSIS — K7581 Nonalcoholic steatohepatitis (NASH): Secondary | ICD-10-CM | POA: Diagnosis not present

## 2023-11-30 LAB — COMPREHENSIVE METABOLIC PANEL WITH GFR
ALT: 62 U/L — ABNORMAL HIGH (ref 0–53)
AST: 59 U/L — ABNORMAL HIGH (ref 0–37)
Albumin: 4.4 g/dL (ref 3.5–5.2)
Alkaline Phosphatase: 187 U/L — ABNORMAL HIGH (ref 39–117)
BUN: 14 mg/dL (ref 6–23)
CO2: 29 meq/L (ref 19–32)
Calcium: 9 mg/dL (ref 8.4–10.5)
Chloride: 96 meq/L (ref 96–112)
Creatinine, Ser: 1.15 mg/dL (ref 0.40–1.50)
GFR: 65.28 mL/min (ref >60.00)
Glucose, Bld: 274 mg/dL — ABNORMAL HIGH (ref 70–99)
Potassium: 3.9 meq/L (ref 3.5–5.1)
Sodium: 136 meq/L (ref 135–145)
Total Bilirubin: 1.8 mg/dL — ABNORMAL HIGH (ref 0.2–1.2)
Total Protein: 6.8 g/dL (ref 6.0–8.3)

## 2023-11-30 LAB — CBC WITH DIFFERENTIAL/PLATELET
Basophils Absolute: 0 K/uL (ref 0.0–0.1)
Basophils Relative: 0.4 % (ref 0.0–3.0)
Eosinophils Absolute: 0.1 K/uL (ref 0.0–0.7)
Eosinophils Relative: 2 % (ref 0.0–5.0)
HCT: 46.2 % (ref 39.0–52.0)
Hemoglobin: 15.5 g/dL (ref 13.0–17.0)
Lymphocytes Relative: 11.5 % — ABNORMAL LOW (ref 12.0–46.0)
Lymphs Abs: 0.6 K/uL — ABNORMAL LOW (ref 0.7–4.0)
MCHC: 33.5 g/dL (ref 30.0–36.0)
MCV: 89.7 fl (ref 78.0–100.0)
Monocytes Absolute: 0.5 K/uL (ref 0.1–1.0)
Monocytes Relative: 9.2 % (ref 3.0–12.0)
Neutro Abs: 4.2 K/uL (ref 1.4–7.7)
Neutrophils Relative %: 76.9 % (ref 43.0–77.0)
Platelets: 145 K/uL — ABNORMAL LOW (ref 150.0–400.0)
RBC: 5.15 Mil/uL (ref 4.22–5.81)
RDW: 13.9 % (ref 11.5–15.5)
WBC: 5.4 K/uL (ref 4.0–10.5)

## 2023-11-30 LAB — PROTIME-INR
INR: 1.2 ratio — ABNORMAL HIGH (ref 0.8–1.0)
Prothrombin Time: 12.5 s (ref 9.6–13.1)

## 2023-11-30 LAB — AMMONIA: Ammonia: 45 umol/L — ABNORMAL HIGH (ref 11–35)

## 2023-11-30 NOTE — Progress Notes (Signed)
 Patient in office today for ambulatory pulse ox indication hepatopulmonary syndrome.  Instructed patient on procedure to ck SPO2 at intervals.pt voiced understanding. Pt is presently not on O2 at home.SPO2 at start 96%  P 71 pt walked length of back hallway and when reached back door SPO2 was 97% P 80. After pt walked back hall to exam room SPO2 at recovery was 96% P 78. All readings were at room air. At no time  did pt experience any CP,SOB,H/A or dizziness. Patient felt comfortable continuing in home setting. Will call the office if any questions. Sending to Dr Rilla who is out of office today and Dr Arlyss Solian who is in office today.

## 2023-12-01 NOTE — Progress Notes (Signed)
 Noted. Maintains oxygenation throughout ambulation.

## 2023-12-06 DIAGNOSIS — I10 Essential (primary) hypertension: Secondary | ICD-10-CM | POA: Diagnosis not present

## 2023-12-06 DIAGNOSIS — I5032 Chronic diastolic (congestive) heart failure: Secondary | ICD-10-CM | POA: Diagnosis not present

## 2023-12-06 DIAGNOSIS — I25112 Atherosclerotic heart disease of native coronary artery with refractory angina pectoris: Secondary | ICD-10-CM | POA: Diagnosis not present

## 2023-12-06 DIAGNOSIS — E782 Mixed hyperlipidemia: Secondary | ICD-10-CM | POA: Diagnosis not present

## 2023-12-06 DIAGNOSIS — R55 Syncope and collapse: Secondary | ICD-10-CM | POA: Diagnosis not present

## 2023-12-07 ENCOUNTER — Telehealth: Payer: Self-pay

## 2023-12-07 ENCOUNTER — Other Ambulatory Visit: Payer: Self-pay | Admitting: Family Medicine

## 2023-12-07 NOTE — Telephone Encounter (Signed)
 Copied from CRM [id number]. Topic: Clinical - Medication Refill >> Dec 07, 2023  4:22 PM Ashley R wrote: Medication: rifaximin  (XIFAXAN ) 550 MG TABS tablet   Has the patient contacted their pharmacy? Yes - Pharmacy calling on behalf of pt - came through as quantity of 42, but the bottle only comes in 60 and it cannot be opened to dispense to patient.  This is the patient's preferred pharmacy:  [facility] - [place], [place] - [address] [place] [address] Phone: [phone] Fax: [phone]   Is this the correct pharmacy for this prescription? Yes If no, delete pharmacy and type the correct one.   Has the prescription been filled recently? Yes  Is the patient out of the medication? Yes - has not started  Has the patient been seen for an appointment in the last year OR does the patient have an upcoming appointment? Yes  Callback [phone]   Agent: Please be advised that Rx refills may take up to 3 business days. We ask that you follow-up with your pharmacy.

## 2023-12-07 NOTE — Transitions of Care (Post Inpatient/ED Visit) (Signed)
 Transition of Care week 3  Visit Note  12/07/2023  Name: Chad [name] MRN: [id number]          DOB: Dec 25, 1955  Situation: Patient enrolled in [facility] 30-day program. Visit completed with patient and spouse Friedrich Harriott by telephone.   Background:   Initial Transition Care Management Follow-up Telephone Call Discharge Date and Diagnosis: 11/18/23, syncope and collapse   Past Medical History:  Diagnosis Date   Abnormal drug screen    innaprop negative for hydrocodone  09/2013, inapprop negative for hydrocodone  and tramadol  02/2014; inappropr negative hydrocodone  03/2015   Acute diverticulitis 08/15/2014   Allergy    seasonal allergies   Anxiety    on meds   Arthritis    both hips and knees; got shots in each hip in August (01/25/2013)   Balanoposthitis 01/29/2015   Bone spur    L4 L5   Bulging lumbar disc    Central retinal vein occlusion with macular edema of left eye (HCC) 10/17/2013   L eye 01/2017  Bulakowski - referred to retinologist 01/2017  CRVO with macular edema L eye planned treatment with Ozurdex  (dexamehthasone intravitreal implant) by Dr Jarold 02/2017   Cirrhosis (HCC)    Coronary artery disease    COVID-19 10/29/2019   10/2019 - s/p mAb infusion treatment      COVID-19 virus infection 10/29/2019   10/2019 - s/p mAb infusion treatment    Diabetes mellitus without complication (HCC)    type 2- on meds   Diastolic CHF, chronic (HCC) 04/02/2012   Diverticulosis    Gastric bypass status for obesity 1985   Gastritis 08/31/2015   with focal intestinal metaplasia   GERD (gastroesophageal reflux disease)    severe, h/o gastritis and GI bleed, per pt normal EGD at [facility] 2008   Hepatic steatosis    History of diabetes mellitus 1990s   with mild background retinopathy, resolved with weight loss   HLD (hyperlipidemia)    statin caused leg cramps   HTN (hypertension)    not on meds at this time (02/05/2020)   Hyperglycemia glucose over 300 in last 24 hrs 07/12/2016    Hyperplastic colon polyp 2008   Hypothyroid    on meds   Hypoxia 11/15/2023   Internal hemorrhoids    Morbid obesity (HCC)    Narrowing of lumbar spine    OSA (obstructive sleep apnea)    unable to use CPAP as of last try 2/2 h/o tracheostomy? weight loss 100lbs   Otomycosis of right ear 07/06/2011   Primary localized osteoarthritis of left knee 06/29/2016   PVC (premature ventricular contraction)    RBBB Infer axis   Right ear pain    s/p eval by ENT - thought TMJ referred pain and sent to oral surg for dental splint   Seasonal allergies    Sensorineural hearing loss, bilateral    no longer wears hearing aides   Splenomegaly    Thrombocytopenia 06/10/2015   Platelet count dropped to 73 post op day 2 after total knee    Tinnitus    due to sensorineural hearing loss R>L with ETD   Trifascicular block  RBBB/LPFB/1AVB     Assessment: Patient Reported Symptoms: Cognitive Cognitive Status: Alert and oriented to person, place, and time, Insightful and able to interpret abstract concepts, Normal speech and language skills      Neurological Neurological Review of Symptoms: No symptoms reported    HEENT HEENT Symptoms Reported: No symptoms reported      Cardiovascular Cardiovascular  Symptoms Reported: No symptoms reported, Lightheadness, Dizziness Cardiovascular Comment: Patient states he continues to have dizziness/ lightheadedness off and on if he stands up to quickly from sitting or lying down.  He reports having a blood pressure monitor however states he is not checking his blood pressure.  Respiratory Respiratory Symptoms Reported: No symptoms reported    Endocrine Endocrine Symptoms Reported: No symptoms reported    Gastrointestinal Gastrointestinal Symptoms Reported: Nausea Additional Gastrointestinal Details: patient reports having nausea in the mornings when he wakes up.  He states the prescribed nausea medication doesn't seem to help much.  He states his daughter gave him  dramamine and that helped. Patient states he was advised by his doctor that he should have a least 3 bowel movements per day. He states that doesn't happen often because he doesn't eat that much. He states he continues to take his lactulose  as prescribed. Gastrointestinal Management Strategies: Adequate rest, Diet modification, Medication therapy (routine follow up)    Genitourinary Genitourinary Symptoms Reported: No symptoms reported    Integumentary Integumentary Symptoms Reported: No symptoms reported    Musculoskeletal Musculoskelatal Symptoms Reviewed: Unsteady gait Additional Musculoskeletal Details: patient states the dizziness causes him to be a little unsteady at times. Patient states he is not using anything to ambulate with at this time. DEnies any recent falls. Musculoskeletal Management Strategies: Routine screening      Psychosocial Psychosocial Symptoms Reported: No symptoms reported         There were no vitals filed for this visit.  Medications Reviewed Today     Reviewed by Jerett Odonohue E, RN (Registered Nurse) on 12/07/23 at 1054  Med List Status: <None>   Medication Order Taking? Sig Documenting Provider Last Dose Status Informant  albuterol  (VENTOLIN  HFA) 108 (90 Base) MCG/ACT inhaler [id number] Yes Inhale 2 puffs into the lungs every 6 (six) hours as needed for wheezing or shortness of breath. [provider]  Active Child, Pharmacy Records, Self  allopurinol  (ZYLOPRIM ) 100 MG tablet [id number] Yes Take 1 tablet (100 mg total) by mouth every other day. Rilla Baller, MD  Active Child, Pharmacy Records, Self  amLODipine  (NORVASC ) 2.5 MG tablet [id number] Yes Take 1 tablet (2.5 mg total) by mouth daily. Rilla Baller, MD  Active Child, Pharmacy Records, Self  aspirin  EC 81 MG tablet [id number] Yes Take 81 mg by mouth daily. [provider]  Active Pharmacy Records, Child, Self  atorvastatin  (LIPITOR) 20 MG tablet [id number] Yes Take 1 tablet (20  mg total) by mouth daily. Rilla Baller, MD  Active Child, Pharmacy Records, Self  bumetanide  (BUMEX ) 1 MG tablet [id number] Yes Take 2 tablets (2 mg total) by mouth daily. Rilla Baller, MD  Active   carvedilol  (COREG ) 3.125 MG tablet [id number] Yes Take 3.125 mg by mouth 2 (two) times daily with a meal. [provider]  Active Self, Child, Pharmacy Records  citalopram  (CELEXA ) 20 MG tablet [id number] Yes Take 1 tablet (20 mg total) by mouth daily. Rilla Baller, MD  Active Child, Pharmacy Records, Self  colchicine  0.6 MG tablet [id number] Yes Take 1 tablet (0.6 mg total) by mouth daily as needed (gout flare). Rilla Baller, MD  Active Child, Pharmacy Records, Self  dapagliflozin  propanediol (FARXIGA ) 10 MG TABS tablet [id number] Yes Take 10 mg by mouth daily before breakfast. Rilla Baller, MD  Active Pharmacy Records, Child, Self  dexlansoprazole  (DEXILANT ) 60 MG capsule [id number] Yes Take 1 capsule (60 mg total) by mouth daily. [place], PA-C  Active Child, Pharmacy Records, Self  famotidine  (PEPCID ) 20 MG tablet [id number] Yes Take 1 tablet (20 mg total) by mouth at bedtime. [place], PA-C  Active Child, Pharmacy Records, Self  Ferrous Sulfate  27 MG TABS [id number] Yes Take 1 tablet by mouth daily with breakfast. [provider]  Active Self, Child, Pharmacy Records  fluticasone  (FLONASE ) 50 MCG/ACT nasal spray [id number] Yes Place 2 sprays into both nostrils daily. Rilla Baller, MD  Active Child, Pharmacy Records, Self  lactulose  (CHRONULAC ) 10 GM/15ML solution [id number] Yes Take 30 mLs (20 g total) by mouth 2 (two) times daily. Rilla Baller, MD  Active   levothyroxine  (SYNTHROID ) 137 MCG tablet [id number] Yes Take 1 tablet (137 mcg total) by mouth daily before breakfast. TAKE ONE TAB BY MOUTH ONCE DAILY. TAKE ON AN EMPTY STOMACH WITH A GLASS OF WATER ATLEAST 30-60 MINUTES BEFORE OFILIA Rilla Baller, MD  Active Child, Pharmacy Records, Self   magnesium  oxide (MAG-OX) 400 (240 Mg) MG tablet [id number] Yes Take 400 mg by mouth. [provider]  Active Self, Child, Pharmacy Records  metoCLOPramide  (REGLAN ) 5 MG tablet [id number] Yes TAKE ONE TABLET (5 MG TOTAL) BY MOUTH EVERY EIGHT HOURS AS NEEDED FOR NAUSEA. [place], PA-C  Active Child, Pharmacy Records, Self  Multiple Vitamins-Minerals (ONE-A-DAY MENS 50+) TABS [id number] Yes Take 1 tablet by mouth in the morning. [provider]  Active Self, Child, Pharmacy Records  nitroGLYCERIN  (NITROSTAT ) 0.4 MG SL tablet [id number] Yes Place 1 tablet (0.4 mg total) under the tongue every 5 (five) minutes as needed for chest pain. [place] LABOR, FNP  Active Pharmacy Records, Child, Self  potassium chloride  SA (KLOR-CON  M) 20 MEQ tablet [id number] Yes Take 1 tablet (20 mEq total) by mouth daily. With bumex  Rilla Baller, MD  Active   Psyllium (METAMUCIL 4 IN 1 FIBER) 55.6 % POWD [id number] Yes Take 1 Dose by mouth in the morning. [provider]  Active Self, Child, Pharmacy Records  rifaximin  (XIFAXAN ) 550 MG TABS tablet [id number] Yes Take 1 tablet (550 mg total) by mouth 2 (two) times daily. Rilla Baller, MD  Active   Semaglutide , 2 MG/DOSE, (OZEMPIC , 2 MG/DOSE,) 8 MG/3ML SOPN [id number] Yes Inject 2 mg into the skin once a week. Rilla Baller, MD  Active Child, Pharmacy Records, Self  spironolactone  (ALDACTONE ) 25 MG tablet [id number] Yes Take 25 mg by mouth daily. [provider]  Active Pharmacy Records, Child, Self  vitamin B-12 (CYANOCOBALAMIN ) 500 MCG tablet [id number] Yes Take 1 tablet (500 mcg total) by mouth daily. Rilla Baller, MD  Active Pharmacy Records, Child, Self  vitamin E  400 UNIT capsule [id number] Yes Take 400 Units by mouth in the morning. [provider]  Active Pharmacy Records, Child, Self            Goals Addressed             This Visit's Progress    VBCI Transitions of Care (TOC) Care Plan        Problems:  Recent Hospitalization for treatment of syncope and collapse Home Health services barrier: patient states he does not need home health services and Knowledge Deficit Related to concerns related to medications bumex  and potassium being discontinued while in the hospital.  Wife states patient was restarted on his bumex  and potassium.     Goal:  Over the next 30 days, the patient will not experience hospital readmission  Interventions:  Transitions of Care: Discussed patients 12/06/23 cardiology visit Assessed for ongoing/ new symptoms Advised to continue to weigh daily and  record.  Advised patient to try saltine crackers and ginger ale to help with nausea symptoms Advised to notify provider for any ongoing or new symptoms Assessed for falls- fall precautions discussed.   Confirmed patient has follow up visit scheduled with gastroenterologist.  Encourage patient to try to eat 3 balanced meals per day and take the lactulose  as prescribed Medications reviewed and compliance discuss/ recommended Advised to use ambulatory device as needed Advised to discuss introducing new medications such as dramamine into medication regimen without discussing with his provider or pharmacist to confirm no drug interactions   Patient Self Care Activities:  Call pharmacy for medication refills 3-7 days in advance of running out of medications Call provider office for new concerns or questions  Notify RN Care Manager of TOC call rescheduling needs Take medications as prescribed   Monitor weight and blood pressure regularly and record. Use ambulatory device as recommended.  Continue to take lactulose  as prescribed Eat at least 3 balanced meals per day discuss introducing new medications such as dramamine into medication regimen without discussing with his provider or pharmacist to confirm no drug interaction  Plan:  Telephone follow up appointment with care management team member scheduled for:   12/12/23 at 1 pm         Recommendation:   Continue Current Plan of Care  Follow Up Plan:   Telephone follow-up in 1 week  Arvin Seip RN, BSN, CCM [place]  [facility], Population Health Case Manager Phone: [phone]

## 2023-12-07 NOTE — Patient Instructions (Signed)
 Visit Information  Thank you for taking time to visit with me today. Please don't hesitate to contact me if I can be of assistance to you before our next scheduled telephone appointment.  Our next appointment is by telephone on 12/12/23 at 1 pm  Following is a copy of your care plan:   Goals Addressed             This Visit's Progress    VBCI Transitions of Care (TOC) Care Plan       Problems:  Recent Hospitalization for treatment of syncope and collapse Home Health services barrier: patient states he does not need home health services and Knowledge Deficit Related to concerns related to medications bumex  and potassium being discontinued while in the hospital.  Wife states patient was restarted on his bumex  and potassium.     Goal:  Over the next 30 days, the patient will not experience hospital readmission  Interventions:  Transitions of Care: Discussed patients 12/06/23 cardiology visit Assessed for ongoing/ new symptoms Advised to continue to weigh daily and record.  Advised patient to try saltine crackers and ginger ale to help with nausea symptoms Advised to notify provider for any ongoing or new symptoms Assessed for falls- fall precautions discussed.   Confirmed patient has follow up visit scheduled with gastroenterologist.  Encourage patient to try to eat 3 balanced meals per day and take the lactulose  as prescribed Medications reviewed and compliance discuss/ recommended Advised to use ambulatory device as needed Advised to discuss introducing new medications such as dramamine into medication regimen without discussing with his provider or pharmacist to confirm no drug interactions   Patient Self Care Activities:  Call pharmacy for medication refills 3-7 days in advance of running out of medications Call provider office for new concerns or questions  Notify RN Care Manager of TOC call rescheduling needs Take medications as prescribed   Monitor weight and blood  pressure regularly and record. Use ambulatory device as recommended.  Continue to take lactulose  as prescribed Eat at least 3 balanced meals per day discuss introducing new medications such as dramamine into medication regimen without discussing with his provider or pharmacist to confirm no drug interaction  Plan:  Telephone follow up appointment with care management team member scheduled for:  12/12/23 at 1 pm        Patient verbalizes understanding of instructions and care plan provided today and agrees to view in MyChart. Active MyChart status and patient understanding of how to access instructions and care plan via MyChart confirmed with patient.     The patient has been provided with contact information for the care management team and has been advised to call with any health related questions or concerns.   Please call the care guide team at [phone] if you need to cancel or reschedule your appointment.   Please call the Suicide and Crisis Lifeline: 988 call the [place]  National Suicide Prevention Lifeline: 838 406 3946 or TTY: 906-731-6173 TTY 801-172-5440) to talk to a trained counselor call 1-800-273-TALK (toll free, 24 hour hotline) if you are experiencing a Mental Health or Behavioral Health Crisis or need someone to talk to.  Arvin Seip RN, BSN, CCM [employer], Population Health Case Manager Phone: [phone]

## 2023-12-08 NOTE — Telephone Encounter (Signed)
 Called patient states that he was told to continue the antibiotic. He did request refill.

## 2023-12-09 ENCOUNTER — Inpatient Hospital Stay
Admission: EM | Admit: 2023-12-09 | Discharge: 2023-12-12 | DRG: 383 | Disposition: A | Discharge status: Home Health | Attending: Internal Medicine | Admitting: Internal Medicine

## 2023-12-09 ENCOUNTER — Other Ambulatory Visit: Payer: Self-pay

## 2023-12-09 ENCOUNTER — Emergency Department

## 2023-12-09 DIAGNOSIS — K7682 Hepatic encephalopathy: Secondary | ICD-10-CM | POA: Diagnosis present

## 2023-12-09 DIAGNOSIS — I10 Essential (primary) hypertension: Secondary | ICD-10-CM | POA: Diagnosis present

## 2023-12-09 DIAGNOSIS — K7581 Nonalcoholic steatohepatitis (NASH): Secondary | ICD-10-CM | POA: Diagnosis present

## 2023-12-09 DIAGNOSIS — R55 Syncope and collapse: Secondary | ICD-10-CM | POA: Diagnosis not present

## 2023-12-09 DIAGNOSIS — K7460 Unspecified cirrhosis of liver: Secondary | ICD-10-CM | POA: Diagnosis present

## 2023-12-09 DIAGNOSIS — G8929 Other chronic pain: Secondary | ICD-10-CM | POA: Diagnosis present

## 2023-12-09 DIAGNOSIS — J449 Chronic obstructive pulmonary disease, unspecified: Secondary | ICD-10-CM | POA: Diagnosis present

## 2023-12-09 DIAGNOSIS — D696 Thrombocytopenia, unspecified: Secondary | ICD-10-CM | POA: Diagnosis not present

## 2023-12-09 DIAGNOSIS — Y92009 Unspecified place in unspecified non-institutional (private) residence as the place of occurrence of the external cause: Secondary | ICD-10-CM

## 2023-12-09 DIAGNOSIS — R296 Repeated falls: Secondary | ICD-10-CM | POA: Diagnosis present

## 2023-12-09 DIAGNOSIS — Z7984 Long term (current) use of oral hypoglycemic drugs: Secondary | ICD-10-CM

## 2023-12-09 DIAGNOSIS — I5032 Chronic diastolic (congestive) heart failure: Secondary | ICD-10-CM | POA: Diagnosis present

## 2023-12-09 DIAGNOSIS — Z8719 Personal history of other diseases of the digestive system: Secondary | ICD-10-CM

## 2023-12-09 DIAGNOSIS — H903 Sensorineural hearing loss, bilateral: Secondary | ICD-10-CM | POA: Diagnosis present

## 2023-12-09 DIAGNOSIS — I453 Trifascicular block: Secondary | ICD-10-CM | POA: Diagnosis present

## 2023-12-09 DIAGNOSIS — R350 Frequency of micturition: Secondary | ICD-10-CM | POA: Diagnosis present

## 2023-12-09 DIAGNOSIS — Z7982 Long term (current) use of aspirin: Secondary | ICD-10-CM

## 2023-12-09 DIAGNOSIS — R0602 Shortness of breath: Secondary | ICD-10-CM | POA: Diagnosis not present

## 2023-12-09 DIAGNOSIS — Z6836 Body mass index (BMI) 36.0-36.9, adult: Secondary | ICD-10-CM

## 2023-12-09 DIAGNOSIS — E66812 Obesity, class 2: Secondary | ICD-10-CM | POA: Diagnosis present

## 2023-12-09 DIAGNOSIS — K7469 Other cirrhosis of liver: Secondary | ICD-10-CM | POA: Diagnosis not present

## 2023-12-09 DIAGNOSIS — K5730 Diverticulosis of large intestine without perforation or abscess without bleeding: Secondary | ICD-10-CM | POA: Diagnosis not present

## 2023-12-09 DIAGNOSIS — N400 Enlarged prostate without lower urinary tract symptoms: Secondary | ICD-10-CM | POA: Diagnosis not present

## 2023-12-09 DIAGNOSIS — E860 Dehydration: Secondary | ICD-10-CM | POA: Diagnosis present

## 2023-12-09 DIAGNOSIS — Y9301 Activity, walking, marching and hiking: Secondary | ICD-10-CM | POA: Diagnosis present

## 2023-12-09 DIAGNOSIS — E11319 Type 2 diabetes mellitus with unspecified diabetic retinopathy without macular edema: Secondary | ICD-10-CM | POA: Diagnosis present

## 2023-12-09 DIAGNOSIS — F419 Anxiety disorder, unspecified: Secondary | ICD-10-CM | POA: Diagnosis present

## 2023-12-09 DIAGNOSIS — K219 Gastro-esophageal reflux disease without esophagitis: Secondary | ICD-10-CM | POA: Diagnosis present

## 2023-12-09 DIAGNOSIS — R1010 Upper abdominal pain, unspecified: Secondary | ICD-10-CM | POA: Diagnosis not present

## 2023-12-09 DIAGNOSIS — E7849 Other hyperlipidemia: Secondary | ICD-10-CM | POA: Diagnosis present

## 2023-12-09 DIAGNOSIS — I110 Hypertensive heart disease with heart failure: Secondary | ICD-10-CM | POA: Diagnosis present

## 2023-12-09 DIAGNOSIS — K8500 Idiopathic acute pancreatitis without necrosis or infection: Secondary | ICD-10-CM | POA: Diagnosis not present

## 2023-12-09 DIAGNOSIS — R112 Nausea with vomiting, unspecified: Secondary | ICD-10-CM | POA: Diagnosis not present

## 2023-12-09 DIAGNOSIS — E876 Hypokalemia: Secondary | ICD-10-CM | POA: Diagnosis present

## 2023-12-09 DIAGNOSIS — R079 Chest pain, unspecified: Secondary | ICD-10-CM | POA: Diagnosis present

## 2023-12-09 DIAGNOSIS — I4510 Unspecified right bundle-branch block: Secondary | ICD-10-CM | POA: Diagnosis present

## 2023-12-09 DIAGNOSIS — E785 Hyperlipidemia, unspecified: Secondary | ICD-10-CM

## 2023-12-09 DIAGNOSIS — Z860100 Personal history of colon polyps, unspecified: Secondary | ICD-10-CM

## 2023-12-09 DIAGNOSIS — K859 Acute pancreatitis without necrosis or infection, unspecified: Principal | ICD-10-CM | POA: Diagnosis present

## 2023-12-09 DIAGNOSIS — Z79899 Other long term (current) drug therapy: Secondary | ICD-10-CM

## 2023-12-09 DIAGNOSIS — Z9884 Bariatric surgery status: Secondary | ICD-10-CM

## 2023-12-09 DIAGNOSIS — Z7985 Long-term (current) use of injectable non-insulin antidiabetic drugs: Secondary | ICD-10-CM

## 2023-12-09 DIAGNOSIS — R161 Splenomegaly, not elsewhere classified: Secondary | ICD-10-CM | POA: Diagnosis not present

## 2023-12-09 DIAGNOSIS — H9313 Tinnitus, bilateral: Secondary | ICD-10-CM | POA: Diagnosis present

## 2023-12-09 DIAGNOSIS — G4733 Obstructive sleep apnea (adult) (pediatric): Secondary | ICD-10-CM | POA: Diagnosis present

## 2023-12-09 DIAGNOSIS — E1169 Type 2 diabetes mellitus with other specified complication: Secondary | ICD-10-CM | POA: Diagnosis present

## 2023-12-09 DIAGNOSIS — Z79890 Hormone replacement therapy (postmenopausal): Secondary | ICD-10-CM

## 2023-12-09 DIAGNOSIS — K279 Peptic ulcer, site unspecified, unspecified as acute or chronic, without hemorrhage or perforation: Principal | ICD-10-CM | POA: Diagnosis present

## 2023-12-09 DIAGNOSIS — Z9842 Cataract extraction status, left eye: Secondary | ICD-10-CM

## 2023-12-09 DIAGNOSIS — W010XXA Fall on same level from slipping, tripping and stumbling without subsequent striking against object, initial encounter: Secondary | ICD-10-CM | POA: Diagnosis present

## 2023-12-09 DIAGNOSIS — R0789 Other chest pain: Principal | ICD-10-CM

## 2023-12-09 DIAGNOSIS — I951 Orthostatic hypotension: Secondary | ICD-10-CM | POA: Diagnosis present

## 2023-12-09 DIAGNOSIS — I2510 Atherosclerotic heart disease of native coronary artery without angina pectoris: Secondary | ICD-10-CM | POA: Diagnosis present

## 2023-12-09 DIAGNOSIS — K3184 Gastroparesis: Secondary | ICD-10-CM | POA: Diagnosis present

## 2023-12-09 DIAGNOSIS — E1143 Type 2 diabetes mellitus with diabetic autonomic (poly)neuropathy: Secondary | ICD-10-CM | POA: Diagnosis present

## 2023-12-09 DIAGNOSIS — Z9049 Acquired absence of other specified parts of digestive tract: Secondary | ICD-10-CM

## 2023-12-09 DIAGNOSIS — M109 Gout, unspecified: Secondary | ICD-10-CM | POA: Diagnosis present

## 2023-12-09 DIAGNOSIS — Z8616 Personal history of COVID-19: Secondary | ICD-10-CM | POA: Diagnosis not present

## 2023-12-09 DIAGNOSIS — R531 Weakness: Secondary | ICD-10-CM | POA: Diagnosis not present

## 2023-12-09 DIAGNOSIS — Z8249 Family history of ischemic heart disease and other diseases of the circulatory system: Secondary | ICD-10-CM

## 2023-12-09 DIAGNOSIS — Z833 Family history of diabetes mellitus: Secondary | ICD-10-CM

## 2023-12-09 DIAGNOSIS — M164 Bilateral post-traumatic osteoarthritis of hip: Secondary | ICD-10-CM | POA: Diagnosis present

## 2023-12-09 DIAGNOSIS — Z96653 Presence of artificial knee joint, bilateral: Secondary | ICD-10-CM | POA: Diagnosis present

## 2023-12-09 DIAGNOSIS — K2950 Unspecified chronic gastritis without bleeding: Secondary | ICD-10-CM | POA: Diagnosis present

## 2023-12-09 DIAGNOSIS — E039 Hypothyroidism, unspecified: Secondary | ICD-10-CM | POA: Diagnosis present

## 2023-12-09 DIAGNOSIS — Z961 Presence of intraocular lens: Secondary | ICD-10-CM | POA: Diagnosis present

## 2023-12-09 DIAGNOSIS — Z955 Presence of coronary angioplasty implant and graft: Secondary | ICD-10-CM

## 2023-12-09 LAB — URINALYSIS, ROUTINE W REFLEX MICROSCOPIC
Bacteria, UA: NONE SEEN
Bilirubin Urine: NEGATIVE
Glucose, UA: >=500 mg/dL — AB
Hgb urine dipstick: NEGATIVE
Ketones, ur: NEGATIVE mg/dL
Leukocytes,Ua: NEGATIVE
Nitrite: NEGATIVE
Protein, ur: NEGATIVE mg/dL
RBC / HPF: 0 RBC/hpf (ref 0–5)
Specific Gravity, Urine: 1.014 (ref 1.005–1.030)
Squamous Epithelial / HPF: 0 /HPF (ref 0–5)
pH: 8 (ref 5.0–8.0)

## 2023-12-09 LAB — BASIC METABOLIC PANEL WITH GFR
Anion gap: 17 — ABNORMAL HIGH (ref 5–15)
BUN: 17 mg/dL (ref 8–23)
CO2: 24 mmol/L (ref 22–32)
Calcium: 8.5 mg/dL — ABNORMAL LOW (ref 8.9–10.3)
Chloride: 96 mmol/L — ABNORMAL LOW (ref 98–111)
Creatinine, Ser: 1.08 mg/dL (ref 0.61–1.24)
GFR, Estimated: >60 mL/min (ref >60)
Glucose, Bld: 328 mg/dL — ABNORMAL HIGH (ref 70–99)
Potassium: 3.3 mmol/L — ABNORMAL LOW (ref 3.5–5.1)
Sodium: 137 mmol/L (ref 135–145)

## 2023-12-09 LAB — BRAIN NATRIURETIC PEPTIDE: B Natriuretic Peptide: 24.9 pg/mL (ref 0.0–100.0)

## 2023-12-09 LAB — CBC
HCT: 42.8 % (ref 39.0–52.0)
Hemoglobin: 15.1 g/dL (ref 13.0–17.0)
MCH: 30.3 pg (ref 26.0–34.0)
MCHC: 35.3 g/dL (ref 30.0–36.0)
MCV: 85.9 fL (ref 80.0–100.0)
Platelets: 132 K/uL — ABNORMAL LOW (ref 150–400)
RBC: 4.98 MIL/uL (ref 4.22–5.81)
RDW: 13 % (ref 11.5–15.5)
WBC: 6.1 K/uL (ref 4.0–10.5)
nRBC: 0 % (ref 0.0–0.2)

## 2023-12-09 LAB — HEPATIC FUNCTION PANEL
ALT: 84 U/L — ABNORMAL HIGH (ref 0–44)
AST: 103 U/L — ABNORMAL HIGH (ref 15–41)
Albumin: 3.7 g/dL (ref 3.5–5.0)
Alkaline Phosphatase: 226 U/L — ABNORMAL HIGH (ref 38–126)
Bilirubin, Direct: 0.6 mg/dL — ABNORMAL HIGH (ref 0.0–0.2)
Indirect Bilirubin: 1.4 mg/dL — ABNORMAL HIGH (ref 0.3–0.9)
Total Bilirubin: 2 mg/dL — ABNORMAL HIGH (ref 0.0–1.2)
Total Protein: 6.3 g/dL — ABNORMAL LOW (ref 6.5–8.1)

## 2023-12-09 LAB — GLUCOSE, CAPILLARY: Glucose-Capillary: 181 mg/dL — ABNORMAL HIGH (ref 70–99)

## 2023-12-09 LAB — LACTIC ACID, PLASMA
Lactic Acid, Venous: 2 mmol/L (ref 0.5–1.9)
Lactic Acid, Venous: 1.4 mmol/L (ref 0.5–1.9)

## 2023-12-09 LAB — APTT: aPTT: 30 s (ref 24–36)

## 2023-12-09 LAB — MAGNESIUM: Magnesium: 2.7 mg/dL — ABNORMAL HIGH (ref 1.7–2.4)

## 2023-12-09 LAB — LIPASE, BLOOD: Lipase: 370 U/L — ABNORMAL HIGH (ref 11–51)

## 2023-12-09 LAB — AMMONIA: Ammonia: 17 umol/L (ref 9–35)

## 2023-12-09 LAB — TROPONIN I (HIGH SENSITIVITY)
Troponin I (High Sensitivity): 6 ng/L (ref <18)
Troponin I (High Sensitivity): 6 ng/L (ref <18)

## 2023-12-09 LAB — PROTIME-INR
INR: 1 (ref 0.8–1.2)
Prothrombin Time: 14.2 s (ref 11.4–15.2)

## 2023-12-09 LAB — TRIGLYCERIDES: Triglycerides: 182 mg/dL — ABNORMAL HIGH (ref <150)

## 2023-12-09 LAB — PHOSPHORUS: Phosphorus: 2.9 mg/dL (ref 2.5–4.6)

## 2023-12-09 MED ORDER — LACTULOSE 10 GM/15ML PO SOLN
20.0000 g | Freq: Two times a day (BID) | ORAL | Status: DC
  Administered 2023-12-09 – 2023-12-10 (×2): 20 g via ORAL
  Filled 2023-12-09 (×3): qty 30

## 2023-12-09 MED ORDER — HYDROMORPHONE HCL 1 MG/ML IJ SOLN
1.0000 mg | Freq: Once | INTRAMUSCULAR | Status: AC
  Administered 2023-12-09: 1 mg via INTRAVENOUS
  Filled 2023-12-09: qty 1

## 2023-12-09 MED ORDER — FERROUS SULFATE 325 (65 FE) MG PO TABS
325.0000 mg | ORAL_TABLET | Freq: Every day | ORAL | Status: DC
  Administered 2023-12-11 – 2023-12-12 (×2): 325 mg via ORAL
  Filled 2023-12-09 (×3): qty 1

## 2023-12-09 MED ORDER — INSULIN ASPART 100 UNIT/ML IJ SOLN
0.0000 [IU] | Freq: Every day | INTRAMUSCULAR | Status: DC
  Administered 2023-12-11: 2 [IU] via SUBCUTANEOUS
  Filled 2023-12-09: qty 1

## 2023-12-09 MED ORDER — COLCHICINE 0.6 MG PO TABS: 0.6000 mg | ORAL_TABLET | Freq: Every day | ORAL | Status: DC | PRN

## 2023-12-09 MED ORDER — NITROGLYCERIN 0.4 MG SL SUBL
0.4000 mg | SUBLINGUAL_TABLET | SUBLINGUAL | Status: DC | PRN
  Administered 2023-12-11: 0.4 mg via SUBLINGUAL
  Filled 2023-12-09: qty 1

## 2023-12-09 MED ORDER — SODIUM CHLORIDE 0.9 % IV BOLUS
500.0000 mL | Freq: Once | INTRAVENOUS | Status: AC
  Administered 2023-12-09: 500 mL via INTRAVENOUS

## 2023-12-09 MED ORDER — HYDRALAZINE HCL 20 MG/ML IJ SOLN: 5.0000 mg | INTRAMUSCULAR | Status: DC | PRN

## 2023-12-09 MED ORDER — CARVEDILOL 6.25 MG PO TABS
3.1250 mg | ORAL_TABLET | Freq: Two times a day (BID) | ORAL | Status: DC
  Administered 2023-12-10 – 2023-12-12 (×5): 3.125 mg via ORAL
  Filled 2023-12-09 (×5): qty 1

## 2023-12-09 MED ORDER — POTASSIUM CHLORIDE CRYS ER 20 MEQ PO TBCR
40.0000 meq | EXTENDED_RELEASE_TABLET | Freq: Once | ORAL | Status: AC
  Administered 2023-12-09: 40 meq via ORAL
  Filled 2023-12-09: qty 2

## 2023-12-09 MED ORDER — SODIUM CHLORIDE 0.9 % IV SOLN: INTRAVENOUS | Status: DC

## 2023-12-09 MED ORDER — OXYCODONE HCL 5 MG PO TABS
5.0000 mg | ORAL_TABLET | Freq: Four times a day (QID) | ORAL | Status: DC | PRN
  Administered 2023-12-09 – 2023-12-11 (×5): 5 mg via ORAL
  Filled 2023-12-09 (×5): qty 1

## 2023-12-09 MED ORDER — VITAMIN E 45 MG (100 UNIT) PO CAPS
400.0000 [IU] | ORAL_CAPSULE | Freq: Every morning | ORAL | Status: DC
  Administered 2023-12-11 – 2023-12-12 (×2): 400 [IU] via ORAL
  Filled 2023-12-09 (×3): qty 4

## 2023-12-09 MED ORDER — FAMOTIDINE 20 MG PO TABS
20.0000 mg | ORAL_TABLET | Freq: Every day | ORAL | Status: DC
  Administered 2023-12-09 – 2023-12-11 (×3): 20 mg via ORAL
  Filled 2023-12-09 (×3): qty 1

## 2023-12-09 MED ORDER — ACETAMINOPHEN 325 MG PO TABS: 325.0000 mg | ORAL_TABLET | Freq: Four times a day (QID) | ORAL | Status: DC | PRN

## 2023-12-09 MED ORDER — INSULIN ASPART 100 UNIT/ML IJ SOLN
0.0000 [IU] | Freq: Three times a day (TID) | INTRAMUSCULAR | Status: DC
  Administered 2023-12-10: 1 [IU] via SUBCUTANEOUS
  Administered 2023-12-10: 2 [IU] via SUBCUTANEOUS
  Administered 2023-12-11: 3 [IU] via SUBCUTANEOUS
  Administered 2023-12-11: 2 [IU] via SUBCUTANEOUS
  Administered 2023-12-11: 1 [IU] via SUBCUTANEOUS
  Administered 2023-12-12: 2 [IU] via SUBCUTANEOUS
  Filled 2023-12-09 (×6): qty 1

## 2023-12-09 MED ORDER — ALLOPURINOL 100 MG PO TABS
100.0000 mg | ORAL_TABLET | ORAL | Status: DC
  Administered 2023-12-11: 100 mg via ORAL
  Filled 2023-12-09: qty 1

## 2023-12-09 MED ORDER — TRAZODONE HCL 50 MG PO TABS: 50.0000 mg | ORAL_TABLET | Freq: Every evening | ORAL | Status: DC | PRN

## 2023-12-09 MED ORDER — CITALOPRAM HYDROBROMIDE 20 MG PO TABS
20.0000 mg | ORAL_TABLET | Freq: Every day | ORAL | Status: DC
  Administered 2023-12-10 – 2023-12-12 (×3): 20 mg via ORAL
  Filled 2023-12-09 (×3): qty 1

## 2023-12-09 MED ORDER — ONDANSETRON HCL 4 MG/2ML IJ SOLN
4.0000 mg | Freq: Three times a day (TID) | INTRAMUSCULAR | Status: DC | PRN
  Administered 2023-12-10 – 2023-12-11 (×3): 4 mg via INTRAVENOUS
  Filled 2023-12-09 (×5): qty 2

## 2023-12-09 MED ORDER — MAGNESIUM OXIDE -MG SUPPLEMENT 400 (240 MG) MG PO TABS
400.0000 mg | ORAL_TABLET | Freq: Every day | ORAL | Status: DC
  Administered 2023-12-11 – 2023-12-12 (×2): 400 mg via ORAL
  Filled 2023-12-09 (×3): qty 1

## 2023-12-09 MED ORDER — ADULT MULTIVITAMIN W/MINERALS CH
1.0000 | ORAL_TABLET | Freq: Every morning | ORAL | Status: DC
  Administered 2023-12-11 – 2023-12-12 (×2): 1 via ORAL
  Filled 2023-12-09 (×3): qty 1

## 2023-12-09 MED ORDER — ZOLPIDEM TARTRATE 5 MG PO TABS
5.0000 mg | ORAL_TABLET | Freq: Every evening | ORAL | Status: DC | PRN
  Administered 2023-12-10 – 2023-12-11 (×2): 5 mg via ORAL
  Filled 2023-12-09 (×2): qty 1

## 2023-12-09 MED ORDER — ATORVASTATIN CALCIUM 20 MG PO TABS
20.0000 mg | ORAL_TABLET | Freq: Every day | ORAL | Status: DC
  Administered 2023-12-09 – 2023-12-11 (×3): 20 mg via ORAL
  Filled 2023-12-09 (×3): qty 1

## 2023-12-09 MED ORDER — RIFAXIMIN 550 MG PO TABS
550.0000 mg | ORAL_TABLET | Freq: Two times a day (BID) | ORAL | Status: DC
  Administered 2023-12-09 – 2023-12-12 (×5): 550 mg via ORAL
  Filled 2023-12-09 (×6): qty 1

## 2023-12-09 MED ORDER — ENOXAPARIN SODIUM 40 MG/0.4ML IJ SOSY: 40.0000 mg | PREFILLED_SYRINGE | INTRAMUSCULAR | Status: DC

## 2023-12-09 MED ORDER — ENOXAPARIN SODIUM 60 MG/0.6ML IJ SOSY
0.5000 mg/kg | PREFILLED_SYRINGE | INTRAMUSCULAR | Status: DC
  Administered 2023-12-09 – 2023-12-11 (×3): 57.5 mg via SUBCUTANEOUS
  Filled 2023-12-09 (×3): qty 0.6

## 2023-12-09 MED ORDER — ASPIRIN 81 MG PO TBEC
81.0000 mg | DELAYED_RELEASE_TABLET | Freq: Every day | ORAL | Status: DC
  Administered 2023-12-11 – 2023-12-12 (×2): 81 mg via ORAL
  Filled 2023-12-09 (×3): qty 1

## 2023-12-09 MED ORDER — PSYLLIUM 95 % PO PACK
1.0000 | PACK | Freq: Every morning | ORAL | Status: DC
  Filled 2023-12-09 (×3): qty 1

## 2023-12-09 MED ORDER — MORPHINE SULFATE (PF) 2 MG/ML IV SOLN
2.0000 mg | INTRAVENOUS | Status: DC | PRN
  Administered 2023-12-10: 2 mg via INTRAVENOUS
  Filled 2023-12-09: qty 1

## 2023-12-09 MED ORDER — ALBUTEROL SULFATE (2.5 MG/3ML) 0.083% IN NEBU: 2.5000 mg | INHALATION_SOLUTION | RESPIRATORY_TRACT | Status: DC | PRN

## 2023-12-09 MED ORDER — LEVOTHYROXINE SODIUM 137 MCG PO TABS
137.0000 ug | ORAL_TABLET | Freq: Every day | ORAL | Status: DC
  Administered 2023-12-10 – 2023-12-12 (×3): 137 ug via ORAL
  Filled 2023-12-09 (×3): qty 1

## 2023-12-09 MED ORDER — ONDANSETRON HCL 4 MG/2ML IJ SOLN
4.0000 mg | Freq: Once | INTRAMUSCULAR | Status: AC
  Administered 2023-12-09: 4 mg via INTRAVENOUS
  Filled 2023-12-09: qty 2

## 2023-12-09 MED ORDER — DM-GUAIFENESIN ER 30-600 MG PO TB12: 1.0000 | ORAL_TABLET | Freq: Two times a day (BID) | ORAL | Status: DC | PRN

## 2023-12-09 NOTE — Progress Notes (Signed)
 PHARMACIST - PHYSICIAN COMMUNICATION  CONCERNING:  Enoxaparin  (Lovenox ) for DVT Prophylaxis    RECOMMENDATION: Patient was prescribed enoxaprin 40mg  q24 hours for VTE prophylaxis.   Filed Weights   12/09/23 1540  Weight: 115.7 kg (255 lb)    Body mass index is 37.66 kg/m.  Estimated Creatinine Clearance: 82.1 mL/min (by C-G formula based on SCr of 1.08 mg/dL).   Based on [facility] policy patient is candidate for enoxaparin  0.5mg /kg TBW SQ every 24 hours based on BMI being >30.  DESCRIPTION: Pharmacy has adjusted enoxaparin  dose per [facility] policy.  Patient is now receiving enoxaparin  57.5 mg every 24 hours    Olam KANDICE Fritter, PharmD Clinical Pharmacist  12/09/2023 8:01 PM
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
## 2023-12-09 NOTE — ED Triage Notes (Signed)
 Pt to ED via [facility] for chest pain. Chest pain started yesterday that radiates to back, SOB, and weakness. Pt states he has hx of DM. Pt states he has nausea and dry heaving. EMS reports pt was orthostatics with sitting. EMS reports pt became pale and diaphoretic upon sitting. EMS vitals CBG 440 BP  lying 130/90 sitting 90/60  NSR  HR 80  RR25

## 2023-12-09 NOTE — ED Notes (Signed)
 MD made aware of lactic 2.0

## 2023-12-09 NOTE — ED Provider Notes (Signed)
 [facility] Provider Note    Event Date/Time   First MD Initiated Contact with Patient 12/09/23 1533     (approximate)   History   Chest Pain   HPI  Chad [name] is a 68 y.o. male with a history of CAD, hypertension, HFpEF, type 2 diabetes, cirrhosis, who presents with dizziness and low blood pressure.  The patient states that since this morning he has been feeling very weak and lightheaded especially when trying to stand up.  Once while walking to the bathroom he got so weak that he fell backwards although caught himself before hitting the ground.  Subsequently he started having some left sided chest pain underneath his breast which is sharp and sometimes spreads across the chest.  This is intermittent.  He reports some associated shortness of breath as well.  He has nausea but no vomiting.  He denies any diarrhea.  He denies frequency or dysuria but does report some burning discomfort to the tip of his penis.  He has been urinating frequently.  When EMS arrived he was found to have orthostatic hypotension.  Reviewed the past medical records.  The patient's most recent outpatient counter was with cardiology on 10/15 for follow-up.  He reported recurrent anginal symptoms at that time.  He was started on Imdur .  He was admitted to [facility] for syncope last month, and also had hepatic encephalopathy.   Physical Exam   Triage Vital Signs: ED Triage Vitals  Encounter Vitals Group     BP --      Girls Systolic BP Percentile --      Girls Diastolic BP Percentile --      Boys Systolic BP Percentile --      Boys Diastolic BP Percentile --      Pulse Rate 12/09/23 1539 74     Resp 12/09/23 1539 16     Temp 12/09/23 1539 97.9 F (36.6 C)     Temp src --      SpO2 12/09/23 1539 100 %     Weight --      Height --      Head Circumference --      Peak Flow --      Pain Score 12/09/23 1540 6     Pain Loc --      Pain Education --      Exclude from Growth  Chart --     Most recent vital signs: Vitals:   12/09/23 1926 12/09/23 1956  BP:  (!) 103/48  Pulse:  75  Resp:  16  Temp: 97.9 F (36.6 C) 98.2 F (36.8 C)  SpO2:  97%     General: Alert, relatively well-appearing, no distress.  CV:  Good peripheral perfusion.  Resp:  Normal effort.  Lungs CTAB. Abd:  Soft with no focal tenderness.  No distention.  Other:  EOMI.  PERRLA.  Normal speech.  Motor intact in all extremities.  Somewhat dry mucous membranes.  Penile meatus and foreskin appears slightly erythematous with no purulence or drainage.   ED Results / Procedures / Treatments   Labs (all labs ordered are listed, but only abnormal results are displayed) Labs Reviewed  BASIC METABOLIC PANEL WITH GFR - Abnormal; Notable for the following components:      Result Value   Potassium 3.3 (*)    Chloride 96 (*)    Glucose, Bld 328 (*)    Calcium  8.5 (*)    Anion gap 17 (*)  All other components within normal limits  CBC - Abnormal; Notable for the following components:   Platelets 132 (*)    All other components within normal limits  HEPATIC FUNCTION PANEL - Abnormal; Notable for the following components:   Total Protein 6.3 (*)    AST 103 (*)    ALT 84 (*)    Alkaline Phosphatase 226 (*)    Total Bilirubin 2.0 (*)    Bilirubin, Direct 0.6 (*)    Indirect Bilirubin 1.4 (*)    All other components within normal limits  LIPASE, BLOOD - Abnormal; Notable for the following components:   Lipase 370 (*)    All other components within normal limits  LACTIC ACID, PLASMA - Abnormal; Notable for the following components:   Lactic Acid, Venous 2.0 (*)    All other components within normal limits  URINALYSIS, ROUTINE W REFLEX MICROSCOPIC - Abnormal; Notable for the following components:   Color, Urine YELLOW (*)    APPearance CLEAR (*)    Glucose, UA >=500 (*)    All other components within normal limits  GLUCOSE, CAPILLARY - Abnormal; Notable for the following components:    Glucose-Capillary 181 (*)    All other components within normal limits  BRAIN NATRIURETIC PEPTIDE  AMMONIA  PROTIME-INR  LACTIC ACID, PLASMA  MAGNESIUM   PHOSPHORUS  TRIGLYCERIDES  APTT  COMPREHENSIVE METABOLIC PANEL WITH GFR  CBC  TROPONIN I (HIGH SENSITIVITY)  TROPONIN I (HIGH SENSITIVITY)     EKG  ED ECG REPORT I, Waylon Cassis, the attending physician, personally viewed and interpreted this ECG.  Date: 12/09/2023 EKG Time: 1536 Rate: 78 Rhythm: normal sinus rhythm QRS Axis: normal Intervals: Prolonged PR, RBBB, LAFB ST/T Wave abnormalities: normal Narrative Interpretation: no evidence of acute ischemia    RADIOLOGY  Chest x-ray: I independently viewed and interpreted the images; there is no focal consolidation or edema  PROCEDURES:  Critical Care performed: No  Procedures   MEDICATIONS ORDERED IN ED: Medications  morphine  (PF) 2 MG/ML injection 2 mg (has no administration in time range)  oxyCODONE  (Oxy IR/ROXICODONE ) immediate release tablet 5 mg (has no administration in time range)  albuterol  (PROVENTIL ) (2.5 MG/3ML) 0.083% nebulizer solution 2.5 mg (has no administration in time range)  dextromethorphan-guaiFENesin  (MUCINEX  DM) 30-600 MG per 12 hr tablet 1 tablet (has no administration in time range)  ondansetron  (ZOFRAN ) injection 4 mg (has no administration in time range)  hydrALAZINE  (APRESOLINE ) injection 5 mg (has no administration in time range)  acetaminophen  (TYLENOL ) tablet 325 mg (has no administration in time range)  potassium chloride  SA (KLOR-CON  M) CR tablet 40 mEq (has no administration in time range)  insulin  aspart (novoLOG ) injection 0-5 Units (has no administration in time range)  insulin  aspart (novoLOG ) injection 0-9 Units (has no administration in time range)  0.9 %  sodium chloride  infusion (has no administration in time range)  allopurinol  (ZYLOPRIM ) tablet 100 mg (has no administration in time range)  aspirin  EC tablet  81 mg (has no administration in time range)  colchicine  tablet 0.6 mg (has no administration in time range)  rifaximin  (XIFAXAN ) tablet 550 mg (has no administration in time range)  atorvastatin  (LIPITOR) tablet 20 mg (has no administration in time range)  carvedilol  (COREG ) tablet 3.125 mg (has no administration in time range)  nitroGLYCERIN  (NITROSTAT ) SL tablet 0.4 mg (has no administration in time range)  citalopram  (CELEXA ) tablet 20 mg (has no administration in time range)  levothyroxine  (SYNTHROID ) tablet 137 mcg (has no administration in  time range)  famotidine  (PEPCID ) tablet 20 mg (has no administration in time range)  lactulose  (CHRONULAC ) 10 GM/15ML solution 20 g (has no administration in time range)  psyllium (HYDROCIL/METAMUCIL) 1 packet (has no administration in time range)  ferrous sulfate  tablet 325 mg (has no administration in time range)  magnesium  oxide (MAG-OX) tablet 400 mg (has no administration in time range)  multivitamin with minerals tablet 1 tablet (has no administration in time range)  vitamin E  capsule 400 Units (has no administration in time range)  enoxaparin  (LOVENOX ) injection 57.5 mg (has no administration in time range)  sodium chloride  0.9 % bolus 500 mL (0 mLs Intravenous Stopped 12/09/23 1845)  sodium chloride  0.9 % bolus 500 mL (0 mLs Intravenous Stopped 12/09/23 1906)  HYDROmorphone  (DILAUDID ) injection 1 mg (1 mg Intravenous Given 12/09/23 1846)  ondansetron  (ZOFRAN ) injection 4 mg (4 mg Intravenous Given 12/09/23 1851)     IMPRESSION / MDM / ASSESSMENT AND PLAN / ED COURSE  I reviewed the triage vital signs and the nursing notes.  68 year old male with PMH as noted above presents with dizziness and near syncope along with atypical chest pain and urinary discomfort.  On exam he is relatively comfortable appearing.  His vital signs are currently normal, although he had orthostatic hypotension with EMS.  Physical exam is unremarkable for acute  findings.  Differential diagnosis includes, but is not limited to, UTI, pneumonia, other infection, dehydration, electrolyte abnormality, AKI, other metabolic disturbance, hepatic encephalopathy, cardiac dysrhythmia, other cardiac etiology.  We will obtain chest x-ray, lab workup, and reassess.  Patient's presentation is most consistent with acute presentation with potential threat to life or bodily function.  The patient is on the cardiac monitor to evaluate for evidence of arrhythmia and/or significant heart rate changes.  ----------------------------------------- 8:20 PM on 12/09/2023 -----------------------------------------  Lab workup was primarily significant for elevated lipase consistent with acute pancreatitis.  LFTs and bilirubin are somewhat elevated but this appears more chronic.  Lactate was borderline elevated.  Troponin and BNP are negative.  Chest x-ray shows no evidence of edema or other acute findings.  CBC shows no leukocytosis.  Ammonia is normal.  The patient reports continued chest and upper abdominal pain, although his blood pressure has remained stable and he is no longer dizzy.  He will need inpatient admission for further management.  I consulted Dr. Hilma from the hospitalist service; based on our discussion he agrees to evaluate the patient for admission.   FINAL CLINICAL IMPRESSION(S) / ED DIAGNOSES   Final diagnoses:  Atypical chest pain  Acute pancreatitis without infection or necrosis, unspecified pancreatitis type     Rx / DC Orders   ED Discharge Orders     None        Note:  This document was prepared using Dragon voice recognition software and may include unintentional dictation errors.    Jacolyn Pae, MD 12/09/23 2021

## 2023-12-10 ENCOUNTER — Inpatient Hospital Stay

## 2023-12-10 DIAGNOSIS — K859 Acute pancreatitis without necrosis or infection, unspecified: Secondary | ICD-10-CM | POA: Diagnosis not present

## 2023-12-10 DIAGNOSIS — K7469 Other cirrhosis of liver: Secondary | ICD-10-CM | POA: Diagnosis not present

## 2023-12-10 DIAGNOSIS — K7581 Nonalcoholic steatohepatitis (NASH): Secondary | ICD-10-CM | POA: Diagnosis not present

## 2023-12-10 DIAGNOSIS — I5032 Chronic diastolic (congestive) heart failure: Secondary | ICD-10-CM | POA: Diagnosis not present

## 2023-12-10 LAB — CBC
HCT: 42.2 % (ref 39.0–52.0)
Hemoglobin: 13.9 g/dL (ref 13.0–17.0)
MCH: 29.8 pg (ref 26.0–34.0)
MCHC: 32.9 g/dL (ref 30.0–36.0)
MCV: 90.4 fL (ref 80.0–100.0)
Platelets: 109 K/uL — ABNORMAL LOW (ref 150–400)
RBC: 4.67 MIL/uL (ref 4.22–5.81)
RDW: 13.3 % (ref 11.5–15.5)
WBC: 4.4 K/uL (ref 4.0–10.5)
nRBC: 0 % (ref 0.0–0.2)

## 2023-12-10 LAB — COMPREHENSIVE METABOLIC PANEL WITH GFR
ALT: 72 U/L — ABNORMAL HIGH (ref 0–44)
AST: 84 U/L — ABNORMAL HIGH (ref 15–41)
Albumin: 3.4 g/dL — ABNORMAL LOW (ref 3.5–5.0)
Alkaline Phosphatase: 188 U/L — ABNORMAL HIGH (ref 38–126)
Anion gap: 12 (ref 5–15)
BUN: 18 mg/dL (ref 8–23)
CO2: 26 mmol/L (ref 22–32)
Calcium: 8.1 mg/dL — ABNORMAL LOW (ref 8.9–10.3)
Chloride: 102 mmol/L (ref 98–111)
Creatinine, Ser: 0.92 mg/dL (ref 0.61–1.24)
GFR, Estimated: >60 mL/min (ref >60)
Glucose, Bld: 172 mg/dL — ABNORMAL HIGH (ref 70–99)
Potassium: 3.5 mmol/L (ref 3.5–5.1)
Sodium: 140 mmol/L (ref 135–145)
Total Bilirubin: 1.8 mg/dL — ABNORMAL HIGH (ref 0.0–1.2)
Total Protein: 5.8 g/dL — ABNORMAL LOW (ref 6.5–8.1)

## 2023-12-10 LAB — GLUCOSE, CAPILLARY
Glucose-Capillary: 165 mg/dL — ABNORMAL HIGH (ref 70–99)
Glucose-Capillary: 149 mg/dL — ABNORMAL HIGH (ref 70–99)
Glucose-Capillary: 171 mg/dL — ABNORMAL HIGH (ref 70–99)

## 2023-12-10 LAB — LIPASE, BLOOD: Lipase: 62 U/L — ABNORMAL HIGH (ref 11–51)

## 2023-12-10 MED ORDER — ONDANSETRON HCL 4 MG/2ML IJ SOLN
4.0000 mg | Freq: Once | INTRAMUSCULAR | Status: AC
  Administered 2023-12-10: 4 mg via INTRAVENOUS
  Filled 2023-12-10: qty 2

## 2023-12-10 MED ORDER — SUCRALFATE 1 GM/10ML PO SUSP
1.0000 g | Freq: Three times a day (TID) | ORAL | Status: DC
  Administered 2023-12-10 – 2023-12-12 (×7): 1 g via ORAL
  Filled 2023-12-10 (×7): qty 10

## 2023-12-10 MED ORDER — IOHEXOL 300 MG/ML  SOLN
100.0000 mL | Freq: Once | INTRAMUSCULAR | Status: AC | PRN
  Administered 2023-12-10: 100 mL via INTRAVENOUS

## 2023-12-10 MED ORDER — POTASSIUM CHLORIDE 20 MEQ PO PACK
40.0000 meq | PACK | Freq: Once | ORAL | Status: DC
  Filled 2023-12-10: qty 2

## 2023-12-10 MED ORDER — IOHEXOL 9 MG/ML PO SOLN
500.0000 mL | ORAL | Status: AC
  Administered 2023-12-10 (×2): 500 mL via ORAL

## 2023-12-10 MED ORDER — PANTOPRAZOLE SODIUM 40 MG IV SOLR
40.0000 mg | Freq: Two times a day (BID) | INTRAVENOUS | Status: DC
  Administered 2023-12-10 – 2023-12-12 (×5): 40 mg via INTRAVENOUS
  Filled 2023-12-10 (×5): qty 10

## 2023-12-10 NOTE — Plan of Care (Signed)
  Problem: Health Behavior/Discharge Planning: Goal: Ability to manage health-related needs will improve Outcome: Progressing   Problem: Education: Goal: Knowledge of General Education information will improve Description: Including pain rating scale, medication(s)/side effects and non-pharmacologic comfort measures Outcome: Progressing   Problem: Health Behavior/Discharge Planning: Goal: Ability to manage health-related needs will improve Outcome: Progressing   Problem: Clinical Measurements: Goal: Ability to maintain clinical measurements within normal limits will improve Outcome: Progressing Goal: Will remain free from infection Outcome: Progressing Goal: Diagnostic test results will improve Outcome: Progressing Goal: Respiratory complications will improve Outcome: Progressing Goal: Cardiovascular complication will be avoided Outcome: Progressing

## 2023-12-10 NOTE — Hospital Course (Signed)
 Chad [name] is a 68 y.o. male with medical history significant of HTN, HLD, DM, CAD, dCHf, hypothyroidism, gout, obesity, cirrhosis due to NASH, thrombocytopenia, trifascicular block, hepatorenal syndrome, who presents with nausea, dry heaves, upper abdominal pain, left lower chest pain, dizziness.  The main concern is left upper quadrant abdominal pain.  Pain started 5 days ago, getting worse for the last 2 days.  He was nauseated.  But no vomiting.  The pain seemed to be worse before the meal, better after eating. He had a mild elevation of lipase of 373, liver function changes.  He was diagnosed with acute pancreatitis.  Patient also had history of cholecystectomy. CT scan of abdomen/pelvis with contrast did not see any acute changes.  Condition more consistent with peptic ulcer disease, he was started on high-dose Protonix , sucralfate .  Condition improved, abdominal pain essentially resolved.  Medically stable for discharge.

## 2023-12-10 NOTE — Progress Notes (Signed)
 Progress Note   Patient: Chad [name] FMW:[id number] DOB: May 09, 1955 DOA: 12/09/2023     1 DOS: the patient was seen and examined on 12/10/2023   Brief hospital course: Chad [name] is a 68 y.o. male with medical history significant of HTN, HLD, DM, CAD, dCHf, hypothyroidism, gout, obesity, cirrhosis due to NASH, thrombocytopenia, trifascicular block, hepatorenal syndrome, who presents with nausea, dry heaves, upper abdominal pain, left lower chest pain, dizziness.  The main concern is left upper quadrant abdominal pain.  Pain started 5 days ago, getting worse for the last 2 days.  He was nauseated.  But no vomiting.  The pain seemed to be worse before the meal, better after eating. He had a mild elevation of lipase of 373, liver function changes.  He was diagnosed with acute pancreatitis.  Patient also had history of cholecystectomy.    Principal Problem:   Acute pancreatitis Active Problems:   Chest pain   CAD (coronary artery disease)   Chronic diastolic CHF (congestive heart failure) (HCC)   Liver cirrhosis secondary to NASH (HCC)   Near syncope   HTN (hypertension)   Hyperlipidemia associated with type 2 diabetes mellitus (HCC)   Diabetes mellitus type 2 with retinopathy (HCC)   Hypothyroidism   Thrombocytopenia   Gout   Hypokalemia   Severe obesity (BMI 35.0-39.9) with comorbidity (HCC)   Assessment and Plan: Left upper quadrant abdominal pain. Mild pancreatitis. Chest pain.  Likely referred pain from upper quadrant. Patient has left upper quadrant abdominal pain, associate with nausea.  The pain seems to be worse before meals.  He has tenderness in the left upper quadrant.  Condition more consistent with a peptic ulcer disease.  He also has mild pancreatitis associated with it. I will obtain a CT abdomen/pelvis to rule out a more serious condition such as pancreatic pseudocyst. Continue symptomatic treatment, also started Protonix  twice a day, sucralfate . I will start a  liquid diet.   Chronic diastolic CHF (congestive heart failure) (HCC): Today: 11/16/2023 showed EF of 55%.  Lipase normal 24.9, CHF seems to be compensated. -Hold diuretics (Bumex  and spironolactone ) due to pancreatitis. Patient does not have volume overload, will continue gentle rehydration until this evening.   Liver cirrhosis secondary to NASH [facility]): Mental status normal.  Ammonia 17.  INR 1.0, PTT 30. -Continue rifaximin  and lactulose  Patient has elevated AST/ALT, patient will be followed by PCP as outpatient. Mild elevation bilirubin, not a concern for CBD obstruction.   Near syncope: Likely due to dehydration and and orthostatic hypotension Diuretics on hold.   HTN (hypertension): Blood pressure is soft Pressure medicine hold.   Hyperlipidemia associated with type 2 diabetes mellitus (HCC) -Lipitor   Diabetes mellitus type 2 with retinopathy [facility]): Recent A1c 6.6, well-controlled.  Patient is taking Fox current Ozempic  -SSI   Hypothyroidism -Synthroid    Thrombocytopenia: This is a chronic issue due to liver cirrhosis.  Platelets are 132   Gout -Continue home allopurinol  and colchicine    Hypokalemia:  Received the potassium, today still 3.5, give additional 40 mEq of KCl orally.   Severe obesity (BMI 35.0-39.9) with comorbidity [facility]): Patient has Obesity Class II, with body weight 113.3 Kg and BMI 36.89  kg/m2.  Diet and exercise       Subjective:  She is still complaining of left upper quadrant abdominal pain.  Some nausea without vomiting.  No additional chest pain today  Physical Exam: Vitals:   12/09/23 2125 12/10/23 0335 12/10/23 0506 12/10/23 0750  BP:  109/60  121/62  Pulse:  66  75  Resp:  18  16  Temp:  98.2 F (36.8 C)  98.9 F (37.2 C)  TempSrc:  Oral    SpO2:  95%  98%  Weight: 113.3 kg  113.9 kg   Height: 5' 9 (1.753 m)      General exam: Appears calm and comfortable  Respiratory system: Clear to auscultation. Respiratory effort  normal. Cardiovascular system: S1 & S2 heard, RRR. No JVD, murmurs, rubs, gallops or clicks. No pedal edema. Gastrointestinal system: Abdomen is nondistended, soft and LUQ tender. No organomegaly or masses felt. Normal bowel sounds heard. Central nervous system: Alert and oriented. No focal neurological deficits. Extremities: Symmetric 5 x 5 power. Skin: No rashes, lesions or ulcers Psychiatry: Judgement and insight appear normal. Mood & affect appropriate.    Data Reviewed:  Reviewed the chest x-ray and the lab results.  Family Communication: None  Disposition: Status is: Inpatient Remains inpatient appropriate because: Severity of disease, IV treatment     Time spent: 55 minutes  Author: Murvin Mana, MD 12/10/2023 9:43 AM  For on call review [email or web address].

## 2023-12-10 NOTE — Progress Notes (Signed)
 Physical Therapy Evaluation Patient Details Name: Chad [name] MRN: [id number] DOB: January 11, 1956 Today's Date: 12/10/2023  History of Present Illness  Pt is a 68 y/o male admitted secondary to nausea, upper abdominal pain, L lower chest pain, dizziness and near syncopal episode at home. Pt found to have acute pancreatitis. Trops negative x2. PMH including but not limited to CHF, DM, HTN, CAD, OSA, C3-4 fusion, L shoulder surgery.   Clinical Impression  Pt presented supine in bed with HOB elevated, awake and willing to participate in therapy session. Prior to admission, pt reported that he was ambulating with use of RW and independent with ADLs. Pt lives with his wife, children and grandchildren in a single level home with a ramped entrance. At the time of evaluation, pt limited with mobility secondary to significant abdominal pain and lightheadedness with positional changes. He tolerated assessment of orthostatic vitals (see below). Pt was able to complete bed mobility at a mod Ind level and transfers with supervision for safety with use of RW. Pt would continue to benefit from skilled physical therapy services at this time while admitted and after d/c to address the below listed limitations in order to improve overall safety and independence with functional mobility.  BP supine = 112/63 mmHg BP sitting = 122/69 mmHg BP standing = 108/67  mmHg      If plan is discharge home, recommend the following: Help with stairs or ramp for entrance;A little help with walking and/or transfers;A little help with bathing/dressing/bathroom;Assistance with cooking/housework   Can travel by private vehicle   No    Equipment Recommendations None recommended by PT  Recommendations for Other Services       Functional Status Assessment Patient has had a recent decline in their functional status and demonstrates the ability to make significant improvements in function in a reasonable and predictable amount of  time.     Precautions / Restrictions Precautions Precautions: Fall Recall of Precautions/Restrictions: Intact Precaution/Restrictions Comments: monitor BP, orthostatic with standing this session Restrictions Weight Bearing Restrictions Per Provider Order: No      Mobility  Bed Mobility Overal bed mobility: Modified Independent             General bed mobility comments: increased time needed    Transfers Overall transfer level: Needs assistance Equipment used: Rolling walker (2 wheels) Transfers: Sit to/from Stand Sit to Stand: Supervision           General transfer comment: pt able to stand from EOB with use of RW and supervision for safety; pt able to stand long enough to allow for BP assessment    Ambulation/Gait               General Gait Details: pt deferring at this time due to abdominal pain  Stairs            Wheelchair Mobility     Tilt Bed    Modified Rankin (Stroke Patients Only)       Balance Overall balance assessment: Needs assistance Sitting-balance support: Feet supported Sitting balance-Leahy Scale: Good     Standing balance support: During functional activity, Single extremity supported, Bilateral upper extremity supported Standing balance-Leahy Scale: Poor Standing balance comment: pt needing RW for stability; able to stand long enough for his BP to be assessed with supervision from PT for safety                             Pertinent  Vitals/Pain Pain Assessment Pain Assessment: Faces Faces Pain Scale: Hurts even more Pain Location: abdomen Pain Descriptors / Indicators: Discomfort Pain Intervention(s): Monitored during session, Repositioned    Home Living Family/patient expects to be discharged to:: Private residence Living Arrangements: Spouse/significant other;Children Available Help at Discharge: Family;Available 24 hours/day Type of Home: Mobile home Home Access: Ramped entrance       Home  Layout: One level Home Equipment: Hand held shower head;Grab bars - tub/shower;BSC/3in1;Tub bench;Rollator (4 wheels)      Prior Function Prior Level of Function : Independent/Modified Independent;Driving             Mobility Comments: ambulates with use of RW ADLs Comments: Ind     Extremity/Trunk Assessment   Upper Extremity Assessment Upper Extremity Assessment: Generalized weakness;Overall WFL for tasks assessed    Lower Extremity Assessment Lower Extremity Assessment: Generalized weakness    Cervical / Trunk Assessment Cervical / Trunk Assessment: Normal  Communication   Communication Communication: No apparent difficulties Factors Affecting Communication: Hearing impaired    Cognition Arousal: Alert Behavior During Therapy: WFL for tasks assessed/performed   PT - Cognitive impairments: No apparent impairments                       PT - Cognition Comments: HOH Following commands: Intact       Cueing Cueing Techniques: Verbal cues     General Comments      Exercises     Assessment/Plan    PT Assessment Patient needs continued PT services  PT Problem List Decreased strength;Decreased activity tolerance;Decreased balance;Decreased mobility;Decreased coordination;Cardiopulmonary status limiting activity;Decreased knowledge of precautions;Pain       PT Treatment Interventions DME instruction;Gait training;Functional mobility training;Stair training;Therapeutic activities;Therapeutic exercise;Balance training;Neuromuscular re-education;Patient/family education    PT Goals (Current goals can be found in the Care Plan section)  Acute Rehab PT Goals Patient Stated Goal: decrease pain PT Goal Formulation: With patient Time For Goal Achievement: 12/24/23 Potential to Achieve Goals: Good    Frequency Min 3X/week     Co-evaluation               AM-PAC PT 6 Clicks Mobility  Outcome Measure Help needed turning from your back to your  side while in a flat bed without using bedrails?: None Help needed moving from lying on your back to sitting on the side of a flat bed without using bedrails?: None Help needed moving to and from a bed to a chair (including a wheelchair)?: A Little Help needed standing up from a chair using your arms (e.g., wheelchair or bedside chair)?: A Little Help needed to walk in hospital room?: A Little Help needed climbing 3-5 steps with a railing? : A Little 6 Click Score: 20    End of Session   Activity Tolerance: Patient limited by pain Patient left: in bed;with call bell/phone within reach;with bed alarm set Nurse Communication: Mobility status PT Visit Diagnosis: Other abnormalities of gait and mobility (R26.89)    Time: 9181-9166 PT Time Calculation (min) (ACUTE ONLY): 15 min   Charges:   PT Evaluation $PT Eval Low Complexity: 1 Low   PT General Charges $$ ACUTE PT VISIT: 1 Visit         Delon DELENA KLEIN, DPT  Acute Rehabilitation Services Office [phone]   Delon HERO Connar Keating 12/10/2023, 9:21 AM

## 2023-12-11 DIAGNOSIS — K7581 Nonalcoholic steatohepatitis (NASH): Secondary | ICD-10-CM | POA: Diagnosis not present

## 2023-12-11 DIAGNOSIS — I5032 Chronic diastolic (congestive) heart failure: Secondary | ICD-10-CM | POA: Diagnosis not present

## 2023-12-11 DIAGNOSIS — K7469 Other cirrhosis of liver: Secondary | ICD-10-CM | POA: Diagnosis not present

## 2023-12-11 DIAGNOSIS — K8500 Idiopathic acute pancreatitis without necrosis or infection: Secondary | ICD-10-CM | POA: Diagnosis not present

## 2023-12-11 LAB — CBC
HCT: 42.7 % (ref 39.0–52.0)
Hemoglobin: 13.7 g/dL (ref 13.0–17.0)
MCH: 29.5 pg (ref 26.0–34.0)
MCHC: 32.1 g/dL (ref 30.0–36.0)
MCV: 92 fL (ref 80.0–100.0)
Platelets: 102 K/uL — ABNORMAL LOW (ref 150–400)
RBC: 4.64 MIL/uL (ref 4.22–5.81)
RDW: 13.2 % (ref 11.5–15.5)
WBC: 3.8 K/uL — ABNORMAL LOW (ref 4.0–10.5)
nRBC: 0 % (ref 0.0–0.2)

## 2023-12-11 LAB — COMPREHENSIVE METABOLIC PANEL WITH GFR
ALT: 66 U/L — ABNORMAL HIGH (ref 0–44)
AST: 89 U/L — ABNORMAL HIGH (ref 15–41)
Albumin: 3.2 g/dL — ABNORMAL LOW (ref 3.5–5.0)
Alkaline Phosphatase: 190 U/L — ABNORMAL HIGH (ref 38–126)
Anion gap: 10 (ref 5–15)
BUN: 12 mg/dL (ref 8–23)
CO2: 25 mmol/L (ref 22–32)
Calcium: 8 mg/dL — ABNORMAL LOW (ref 8.9–10.3)
Chloride: 102 mmol/L (ref 98–111)
Creatinine, Ser: 0.78 mg/dL (ref 0.61–1.24)
GFR, Estimated: >60 mL/min (ref >60)
Glucose, Bld: 124 mg/dL — ABNORMAL HIGH (ref 70–99)
Potassium: 3.7 mmol/L (ref 3.5–5.1)
Sodium: 137 mmol/L (ref 135–145)
Total Bilirubin: 1.4 mg/dL — ABNORMAL HIGH (ref 0.0–1.2)
Total Protein: 5.4 g/dL — ABNORMAL LOW (ref 6.5–8.1)

## 2023-12-11 LAB — MAGNESIUM: Magnesium: 2.5 mg/dL — ABNORMAL HIGH (ref 1.7–2.4)

## 2023-12-11 LAB — GLUCOSE, CAPILLARY
Glucose-Capillary: 137 mg/dL — ABNORMAL HIGH (ref 70–99)
Glucose-Capillary: 213 mg/dL — ABNORMAL HIGH (ref 70–99)
Glucose-Capillary: 188 mg/dL — ABNORMAL HIGH (ref 70–99)
Glucose-Capillary: 213 mg/dL — ABNORMAL HIGH (ref 70–99)
Glucose-Capillary: 244 mg/dL — ABNORMAL HIGH (ref 70–99)

## 2023-12-11 MED ORDER — RIFAXIMIN 550 MG PO TABS: 550.0000 mg | ORAL_TABLET | Freq: Two times a day (BID) | ORAL | 0 refills | Status: DC

## 2023-12-11 MED ORDER — SPIRONOLACTONE 25 MG PO TABS
25.0000 mg | ORAL_TABLET | Freq: Every day | ORAL | Status: DC
  Administered 2023-12-11 – 2023-12-12 (×2): 25 mg via ORAL
  Filled 2023-12-11 (×2): qty 1

## 2023-12-11 MED ORDER — LACTULOSE 10 GM/15ML PO SOLN
20.0000 g | Freq: Three times a day (TID) | ORAL | Status: DC
  Administered 2023-12-11 – 2023-12-12 (×3): 20 g via ORAL
  Filled 2023-12-11 (×3): qty 30

## 2023-12-11 MED ORDER — FLUTICASONE FUROATE-VILANTEROL 200-25 MCG/ACT IN AEPB
1.0000 | INHALATION_SPRAY | Freq: Every day | RESPIRATORY_TRACT | Status: DC
  Administered 2023-12-11 – 2023-12-12 (×2): 1 via RESPIRATORY_TRACT
  Filled 2023-12-11: qty 28

## 2023-12-11 MED ORDER — BUMETANIDE 1 MG PO TABS
2.0000 mg | ORAL_TABLET | Freq: Every day | ORAL | Status: DC
  Administered 2023-12-11 – 2023-12-12 (×2): 2 mg via ORAL
  Filled 2023-12-11 (×2): qty 2

## 2023-12-11 NOTE — TOC Initial Note (Signed)
 Transition of Care [facility]) - Initial/Assessment Note    Patient Details  Name: Chad [name] MRN: [id number] Date of Birth: [birth year]  Transition of Care [facility]) CM/SW Contact:    Corean ONEIDA Haddock, RN Phone Number: 12/11/2023, 3:46 PM  Clinical Narrative:                  Patient admitted for pancreatitis  Therapy recommending Home Health Referral sent out in HUB CM to follow up with patient about recs        Patient Goals and CMS Choice            Expected Discharge Plan and Services                                              Prior Living Arrangements/Services                       Activities of Daily Living   ADL Screening (condition at time of admission) Independently performs ADLs?: No Does the patient have a NEW difficulty with bathing/dressing/toileting/self-feeding that is expected to last >3 days?: Yes (Initiates electronic notice to provider for possible OT consult) Does the patient have a NEW difficulty with getting in/out of bed, walking, or climbing stairs that is expected to last >3 days?: Yes (Initiates electronic notice to provider for possible PT consult) Does the patient have a NEW difficulty with communication that is expected to last >3 days?: No Is the patient deaf or have difficulty hearing?: No Does the patient have difficulty seeing, even when wearing glasses/contacts?: No Does the patient have difficulty concentrating, remembering, or making decisions?: No  Permission Sought/Granted                  Emotional Assessment              Admission diagnosis:  Acute pancreatitis [K85.90] Pancreatitis [K85.90] Patient Active Problem List   Diagnosis Date Noted   Acute pancreatitis 12/09/2023   CAD (coronary artery disease) 12/09/2023   Hypokalemia 12/09/2023   Near syncope 12/09/2023   Hepatopulmonary syndrome (HCC) 11/17/2023   Hypoxia 11/15/2023   (HFpEF) heart failure with preserved ejection fraction (HCC)  11/15/2023   Elevated troponin 11/15/2023   Portal hypertension (HCC) 11/14/2023   Constipation 11/14/2023   Syncope and collapse 11/13/2023   Leg cramping 05/15/2023   Chest pain 05/11/2023   Type 2 diabetes mellitus with other specified complication (HCC) 02/16/2023   Malaise and fatigue 07/25/2022   Gout 06/06/2022   Diminished pulses in lower extremity 01/16/2022   Anterior chest wall pain 03/19/2020   Elevated alkaline phosphatase level 03/19/2020   Statin intolerance 03/19/2020   Benign neoplasm of ascending colon    Abnormality of esophagus 11/12/2019   Atherosclerosis of aorta 10/13/2019   Iron deficiency 01/09/2019   DDD (degenerative disc disease), cervical 10/15/2018   Situational anxiety 05/01/2018   Asthma 12/08/2016   Bilateral hearing loss 11/17/2016   Primary osteoarthritis of left knee 06/08/2016   Diabetic neuropathy associated with type 2 diabetes mellitus (HCC) 04/04/2016   Carotid stenosis 12/21/2015   Thrombocytopenia 06/10/2015   Splenomegaly 05/07/2015   Advanced care planning/counseling discussion 04/28/2015   Myelolipoma of right adrenal gland 04/04/2015   Liver cirrhosis secondary to NASH (HCC) 04/03/2015   Cough 11/04/2013   Tinea corporis 11/04/2013   Central retinal vein  occlusion with macular edema of left eye (HCC) 10/17/2013   Abnormal drug screen 09/21/2013   Bariatric surgery status 08/21/2013   Diabetic gastroparesis (HCC) 04/14/2013   Bradycardia 01/04/2013   Chronic diastolic CHF (congestive heart failure) (HCC) 04/02/2012   1st degree AV block 10/06/2011   Primary osteoarthritis of right knee 06/07/2011   Medicare annual wellness visit, subsequent 03/08/2011   Arthritis 01/31/2011   OSA (obstructive sleep apnea) 01/14/2011   Severe obesity (BMI 35.0-39.9) with comorbidity (HCC) 01/14/2011   CAD (coronary artery disease), native coronary artery 01/14/2011   Irregular heart beat 12/28/2010   Diabetes mellitus type 2 with retinopathy  (HCC)    HTN (hypertension)    Hyperlipidemia associated with type 2 diabetes mellitus (HCC)    GERD (gastroesophageal reflux disease)    Seasonal allergies    Hypothyroidism    Colon polyps    PCP:  Rilla Baller, MD Pharmacy:   [facility] - [place], [place] - [address] [place] [address] Phone: [phone] Fax: [phone]  DARRYLE LONG - [facility] [address] [place] [address] Phone: [phone] Fax: [phone]     Social Drivers of Health (SDOH) Social History: SDOH Screenings   Food Insecurity: No Food Insecurity (12/09/2023)  Recent Concern: Food Insecurity - Food Insecurity Present (11/14/2023)  Housing: Low Risk  (12/09/2023)  Transportation Needs: No Transportation Needs (12/09/2023)  Utilities: Not At Risk (12/09/2023)  Alcohol Screen: Low Risk  (05/25/2023)  Depression (PHQ2-9): Low Risk  (11/24/2023)  Recent Concern: Depression (PHQ2-9) - Medium Risk (11/07/2023)  Financial Resource Strain: Low Risk  (05/25/2023)  Physical Activity: Insufficiently Active (05/25/2023)  Social Connections: Moderately Isolated (12/09/2023)  Stress: No Stress Concern Present (05/25/2023)  Tobacco Use: Low Risk  (12/09/2023)   SDOH Interventions:     Readmission Risk Interventions     No data to display

## 2023-12-11 NOTE — Plan of Care (Signed)

## 2023-12-11 NOTE — Care Management Important Message (Signed)
 Important Message  Patient Details  Name: Chad [name] MRN: [id number] Date of Birth: 31-Jan-1956   Important Message Given:  Yes - Medicare IM     Rojelio SHAUNNA Rattler 12/11/2023, 12:54 PM

## 2023-12-11 NOTE — Evaluation (Signed)
 Occupational Therapy Evaluation Patient Details Name: Chad [name] MRN: [id number] DOB: [birth year] Today's Date: 12/11/2023   History of Present Illness   Pt is a 68 y/o male admitted secondary to nausea, upper abdominal pain, L lower chest pain, dizziness and near syncopal episode at home. Pt found to have acute pancreatitis. Trops negative x2. PMH including but not limited to CHF, DM, HTN, CAD, OSA, C3-4 fusion, L shoulder surgery.     Clinical Impressions Pt was seen for OT evaluation this date. Pt received in the bathroom, nursing present noting pt had already gone to the bathroom for BM just prior and was back for another. Pt found seated on the toilet arms braced on the grab bar endorsing abdominal pain. Pt bent forward fairly significantly in order to complete pericare and then when he sat back down on the toilet he endorsed significant dizziness and nausea. Offered blue emesis bag and pt declined. Sat for a few moments longer and pt ambulated back to bed with CGA + RW. BP once back in bed 121/67. Not long after pt laid back down he needed to have another urgent loose BM. BSC brought to bedside to improve safety given dizziness. Pt presents with deficits in dizziness with positional changes, balance, strength, and abdominal pain, affecting safe and optimal ADL completion. Pt currently requires CGA for ADL transfers with RW, and LB ADL involving transfers. Pt would benefit from skilled OT services to address noted impairments and functional limitations (see below for any additional details) in order to maximize safety and independence while minimizing future risk of falls, injury, and readmission. Anticipate the need for follow up OT services upon acute hospital DC.    If plan is discharge home, recommend the following:   Help with stairs or ramp for entrance;Assistance with cooking/housework;Assist for transportation;A little help with walking and/or transfers;A little help with  bathing/dressing/bathroom     Functional Status Assessment   Patient has had a recent decline in their functional status and demonstrates the ability to make significant improvements in function in a reasonable and predictable amount of time.     Equipment Recommendations   None recommended by OT     Recommendations for Other Services         Precautions/Restrictions   Precautions Precautions: Fall Recall of Precautions/Restrictions: Intact Precaution/Restrictions Comments: monitor BP Restrictions Weight Bearing Restrictions Per Provider Order: No     Mobility Bed Mobility Overal bed mobility: Modified Independent             General bed mobility comments: increased effort    Transfers Overall transfer level: Needs assistance Equipment used: Rolling walker (2 wheels) Transfers: Sit to/from Stand Sit to Stand: Contact guard assist                  Balance Overall balance assessment: Needs assistance Sitting-balance support: Feet supported, Single extremity supported, Bilateral upper extremity supported Sitting balance-Leahy Scale: Fair Sitting balance - Comments: endorsed dizziness seated on toilet, bracing arms on grab bar and RW   Standing balance support: Bilateral upper extremity supported, During functional activity, Reliant on assistive device for balance Standing balance-Leahy Scale: Poor Standing balance comment: heavy reliance on RW for stability wiht ambulating back to bed from toilet and again for step pivot to the [facility] with rW                           ADL either performed or assessed with clinical judgement  ADL Overall ADL's : Needs assistance/impaired                         Toilet Transfer: Ambulation;Regular Toilet;BSC/3in1;Rolling walker (2 wheels);Contact guard assist Toilet Transfer Details (indicate cue type and reason): CGA from std height toilet wiht heavy reliance on grab bar and RW to pull himself  up, CGA for step pivot to [facility] Toileting- Clothing Manipulation and Hygiene: Sitting/lateral lean;Contact guard assist Toileting - Clothing Manipulation Details (indicate cue type and reason): Pt leaning far forward to reach for pericare then sitting back up endorsing increased dizziness and nausea with abd pain     Functional mobility during ADLs: Contact guard assist;Rolling walker (2 wheels);Cueing for safety       Vision         Perception         Praxis         Pertinent Vitals/Pain Pain Assessment Pain Assessment: Faces Faces Pain Scale: Hurts even more Pain Location: abdomen Pain Descriptors / Indicators: Discomfort, Grimacing Pain Intervention(s): Monitored during session, Repositioned     Extremity/Trunk Assessment Upper Extremity Assessment Upper Extremity Assessment: Generalized weakness;Overall WFL for tasks assessed   Lower Extremity Assessment Lower Extremity Assessment: Generalized weakness   Cervical / Trunk Assessment Cervical / Trunk Assessment: Normal   Communication Communication Communication: Impaired Factors Affecting Communication: Hearing impaired   Cognition Arousal: Alert Behavior During Therapy: WFL for tasks assessed/performed Cognition: No apparent impairments                               Following commands: Intact       Cueing  General Comments   Cueing Techniques: Verbal cues  BP taken once returned to bed 2/2 pt feeling very poorly - 121/67   Exercises Other Exercises Other Exercises: Pt educated in minimizing positional changes during ADL and mobility to minimize risk of dizziness, educated in benefits of BSC beside the bed for urgent loose BMs and 2/2 dizziness with positional changes making him a higher falls risk walking to the abthroom   Shoulder Instructions      Home Living Family/patient expects to be discharged to:: Private residence Living Arrangements: Spouse/significant  other;Children Available Help at Discharge: Family;Available 24 hours/day Type of Home: Mobile home Home Access: Ramped entrance     Home Layout: One level     Bathroom Shower/Tub: Walk-in shower;Curtain   Bathroom Toilet: Handicapped height     Home Equipment: Hand held shower head;Grab bars - tub/shower;BSC/3in1;Tub bench;Rollator (4 wheels)   Additional Comments: Per recent admission in Sept 2025: over the past year reports no energy, just wants to sleep alot and no appetite, has lost 110 pounds over the last 2 years (has been on Ozempic ). His dtr and her husband as well as 2 grandchildren live with them)--dtr does not work out of the house--she takes care of the 68 yo.      Prior Functioning/Environment Prior Level of Function : Independent/Modified Independent;Driving             Mobility Comments: ambulates with use of RW ADLs Comments: Ind    OT Problem List: Impaired balance (sitting and/or standing);Pain;Decreased safety awareness;Decreased knowledge of use of DME or AE   OT Treatment/Interventions: Self-care/ADL training;Balance training;DME and/or AE instruction;Patient/family education;Therapeutic exercise;Therapeutic activities      OT Goals(Current goals can be found in the care plan section)   Acute Rehab OT Goals Patient  Stated Goal: feel better OT Goal Formulation: With patient Time For Goal Achievement: 12/25/23 Potential to Achieve Goals: Good ADL Goals Pt Will Perform Lower Body Dressing: with modified independence;sit to/from stand Pt Will Transfer to Toilet: with modified independence;ambulating;regular height toilet (LRAD) Pt Will Perform Toileting - Clothing Manipulation and hygiene: with modified independence;sit to/from stand;sitting/lateral leans Additional ADL Goal #1: Pt will verbalize plan to implement at least 2 learned falls prevention strategies to support safety at home.   OT Frequency:  Min 2X/week    Co-evaluation               AM-PAC OT 6 Clicks Daily Activity     Outcome Measure Help from another person eating meals?: None Help from another person taking care of personal grooming?: A Little Help from another person toileting, which includes using toliet, bedpan, or urinal?: A Little Help from another person bathing (including washing, rinsing, drying)?: A Little Help from another person to put on and taking off regular upper body clothing?: A Little Help from another person to put on and taking off regular lower body clothing?: A Little 6 Click Score: 19   End of Session Equipment Utilized During Treatment: Rolling walker (2 wheels) Nurse Communication: Mobility status;Other (comment) (dizzy, abd pain, loose BMs)  Activity Tolerance: Other (comment) (limited by dizziness, nausea, abd pain) Patient left: in bed;with call bell/phone within reach;with bed alarm set;with family/visitor present;with nursing/sitter in room  OT Visit Diagnosis: Unsteadiness on feet (R26.81);Other abnormalities of gait and mobility (R26.89);Repeated falls (R29.6);History of falling (Z91.81);Pain Pain - part of body:  (abd pain)                Time: 1005-1015 OT Time Calculation (min): 10 min Charges:  OT General Charges $OT Visit: 1 Visit OT Evaluation $OT Eval Moderate Complexity: 1 Mod  Warren SAUNDERS., MPH, MS, OTR/L ascom [phone] 12/11/23, 1:35 PM

## 2023-12-11 NOTE — Plan of Care (Signed)

## 2023-12-11 NOTE — Progress Notes (Signed)
 Progress Note   Patient: Chad [name] FMW:[id number] DOB: 1955/08/14 DOA: 12/09/2023     2 DOS: the patient was seen and examined on 12/11/2023   Brief hospital course: Chad [name] is a 68 y.o. male with medical history significant of HTN, HLD, DM, CAD, dCHf, hypothyroidism, gout, obesity, cirrhosis due to NASH, thrombocytopenia, trifascicular block, hepatorenal syndrome, who presents with nausea, dry heaves, upper abdominal pain, left lower chest pain, dizziness.  The main concern is left upper quadrant abdominal pain.  Pain started 5 days ago, getting worse for the last 2 days.  He was nauseated.  But no vomiting.  The pain seemed to be worse before the meal, better after eating. He had a mild elevation of lipase of 373, liver function changes.  He was diagnosed with acute pancreatitis.  Patient also had history of cholecystectomy.    Principal Problem:   Acute pancreatitis Active Problems:   Chest pain   CAD (coronary artery disease)   Chronic diastolic CHF (congestive heart failure) (HCC)   Liver cirrhosis secondary to NASH (HCC)   Near syncope   HTN (hypertension)   Hyperlipidemia associated with type 2 diabetes mellitus (HCC)   Diabetes mellitus type 2 with retinopathy (HCC)   Hypothyroidism   Thrombocytopenia   Gout   Hypokalemia   Severe obesity (BMI 35.0-39.9) with comorbidity (HCC)   Assessment and Plan: Left upper quadrant abdominal pain. Mild pancreatitis. Chest pain.  Likely referred pain from upper quadrant. Patient has left upper quadrant abdominal pain, associate with nausea.  The pain seems to be worse before meals.  He has tenderness in the left upper quadrant.  Condition more consistent with a peptic ulcer disease.  He also has mild pancreatitis associated with it.  Patient had a CT scan of abdomen/pelvis with contrast, did not show any acute changes. Continue symptomatic treatment, also started Protonix  twice a day, sucralfate . Patient seemed to be better  with nausea, tolerating liquid diet.  Advance to soft diet. Chest pain appeared to be referred pain from left upper quadrant.  Troponin was normal.     Chronic diastolic CHF (congestive heart failure) (HCC): Today: 11/16/2023 showed EF of 55%.   CHF seems to be compensated. Patient no longer has any dehydration, restart home diuretics.  Patient BNP was 24.9 at admission, no exacerbation of congestive heart failure.  Chronic shortness of breath, likely COPD. Patient had a multiple echocardiogram performed, ejection fraction was 55 to 60%, in 1 study, pulmonary arterial pressure was normal. Patient has chronic short of breath for years.  This could be secondary to COPD.  Start scheduled bronchodilator.  Liver cirrhosis secondary to NASH [facility]): Mental status normal.  Ammonia 17.  INR 1.0, PTT 30. -Continue rifaximin  and lactulose  Patient has elevated AST/ALT, patient will be followed by PCP as outpatient. Mild elevation bilirubin, not a concern for CBD obstruction.   Near syncope Frequent falls Orthostatic hypotension. Patient had a frequent falls from dizziness.  He feels dizzy with standing up.  In doctors office, he was found to have orthostatic hypotension.  Will check orthostatic vital signs.  PT/OT.   HTN (hypertension):  Coreg  restarted.   Hyperlipidemia associated with type 2 diabetes mellitus (HCC) -Lipitor   Diabetes mellitus type 2 with retinopathy [facility]): Recent A1c 6.6, well-controlled.  Patient is taking Fox current Ozempic  -SSI   Hypothyroidism -Synthroid    Thrombocytopenia: This is a chronic issue due to liver cirrhosis.  Platelets are 132   Gout -Continue home allopurinol  and colchicine   Hypokalemia:  Received the potassium, today still 3.5, give additional 40 mEq of KCl orally.   Severe obesity (BMI 35.0-39.9) with comorbidity [facility]): Patient has Obesity Class II, with body weight 113.3 Kg and BMI 36.89  kg/m2.  Diet and exercise         Subjective:   Patient still complaining of left upper quadrant abdominal pain, nausea, but tolerating diet.  He had large bowel movements after giving stool softener. He continues to have short of breath, no cough.  Physical Exam: Vitals:   12/10/23 2057 12/11/23 0410 12/11/23 0700 12/11/23 1047  BP: 111/69 (!) 114/59 135/74 110/69  Pulse: 63 67 63 64  Resp: 16 18 20 18   Temp: 98 F (36.7 C) 98.3 F (36.8 C) 98 F (36.7 C)   TempSrc: Oral Oral Oral   SpO2: 100% 96% 96% 97%  Weight:      Height:       General exam: Appears calm and comfortable, morbid obese Respiratory system: Decreased breath sounds without crackles. Respiratory effort normal. Cardiovascular system: S1 & S2 heard, RRR. No JVD, murmurs, rubs, gallops or clicks. No pedal edema. Gastrointestinal system: Abdomen is nondistended, soft and nontender. No organomegaly or masses felt. Normal bowel sounds heard. Central nervous system: Alert and oriented. No focal neurological deficits. Extremities: Symmetric 5 x 5 power. Skin: No rashes, lesions or ulcers Psychiatry: Judgement and insight appear normal. Mood & affect appropriate.    Data Reviewed:  Reviewed her prior echocardiograms, CT scan results, current lab results.  Family Communication: Wife updated at bedside  Disposition: Status is: Inpatient Remains inpatient appropriate because: Severity of disease     Time spent: 60 minutes  Author: Murvin Mana, MD 12/11/2023 11:02 AM  For on call review [email or web address].

## 2023-12-11 NOTE — Telephone Encounter (Unsigned)
 Copied from CRM [id number]. Topic: Clinical - Medication Refill >> Dec 07, 2023  4:22 PM Ashley R wrote: Medication: rifaximin  (XIFAXAN ) 550 MG TABS tablet   Has the patient contacted their pharmacy? Yes - Pharmacy calling on behalf of pt - came through as quantity of 42, but the bottle only comes in 60 and it cannot be opened to dispense to patient.  This is the patient's preferred pharmacy:  [facility] - [place], [place] - [address] [place] [address] Phone: [phone] Fax: [phone]   Is this the correct pharmacy for this prescription? Yes If no, delete pharmacy and type the correct one.   Has the prescription been filled recently? Yes  Is the patient out of the medication? Yes - has not started  Has the patient been seen for an appointment in the last year OR does the patient have an upcoming appointment? Yes  Callback [phone]   Agent: Please be advised that Rx refills may take up to 3 business days. We ask that you follow-up with your pharmacy. >> Dec 11, 2023  8:43 AM Rosina BIRCH wrote: Rozelle from [facility] pharmacy called stating they cannot dispense the medication the way it was written. She need a script for the whole bottle of 60 [phone]

## 2023-12-12 ENCOUNTER — Telehealth: Payer: Self-pay

## 2023-12-12 ENCOUNTER — Other Ambulatory Visit: Payer: Self-pay | Admitting: Family Medicine

## 2023-12-12 ENCOUNTER — Other Ambulatory Visit: Payer: Self-pay

## 2023-12-12 ENCOUNTER — Ambulatory Visit: Admitting: Dermatology

## 2023-12-12 DIAGNOSIS — I5032 Chronic diastolic (congestive) heart failure: Secondary | ICD-10-CM | POA: Diagnosis not present

## 2023-12-12 DIAGNOSIS — K7581 Nonalcoholic steatohepatitis (NASH): Secondary | ICD-10-CM | POA: Diagnosis not present

## 2023-12-12 DIAGNOSIS — K7469 Other cirrhosis of liver: Secondary | ICD-10-CM | POA: Diagnosis not present

## 2023-12-12 DIAGNOSIS — K8500 Idiopathic acute pancreatitis without necrosis or infection: Secondary | ICD-10-CM | POA: Diagnosis not present

## 2023-12-12 LAB — GLUCOSE, CAPILLARY: Glucose-Capillary: 172 mg/dL — ABNORMAL HIGH (ref 70–99)

## 2023-12-12 MED ORDER — FLUTICASONE FUROATE-VILANTEROL 200-25 MCG/ACT IN AEPB
1.0000 | INHALATION_SPRAY | Freq: Every day | RESPIRATORY_TRACT | 0 refills | Status: DC
  Filled 2023-12-12: qty 60, 30d supply, fill #0

## 2023-12-12 MED ORDER — SUCRALFATE 1 G PO TABS
1.0000 g | ORAL_TABLET | Freq: Three times a day (TID) | ORAL | 0 refills | Status: DC
  Filled 2023-12-12: qty 28, 7d supply, fill #0
  Filled 2023-12-12: qty 280, 7d supply, fill #0

## 2023-12-12 MED ORDER — PANTOPRAZOLE SODIUM 40 MG PO TBEC
40.0000 mg | DELAYED_RELEASE_TABLET | Freq: Two times a day (BID) | ORAL | 0 refills | Status: DC
  Filled 2023-12-12: qty 60, 30d supply, fill #0

## 2023-12-12 NOTE — Telephone Encounter (Addendum)
 New Rx sent for rifaximin .  See refill request.

## 2023-12-12 NOTE — Discharge Summary (Signed)
 Physician Discharge Summary   Patient: Chad [name] MRN: [id number] DOB: November 26, 1955  Admit date:     12/09/2023  Discharge date: 12/12/23  Discharge Physician: Murvin Mana   PCP: Rilla Baller, MD   Recommendations at discharge:   Follow-up PCP in 1 week. Follow-up with GI as previous scheduled.  Discharge Diagnoses: Principal Problem:   Acute pancreatitis Active Problems:   Chest pain   CAD (coronary artery disease)   Chronic diastolic CHF (congestive heart failure) (HCC)   Liver cirrhosis secondary to NASH (HCC)   Near syncope   HTN (hypertension)   Hyperlipidemia associated with type 2 diabetes mellitus (HCC)   Diabetes mellitus type 2 with retinopathy (HCC)   Hypothyroidism   Thrombocytopenia   Gout   Hypokalemia   Severe obesity (BMI 35.0-39.9) with comorbidity (HCC)  Resolved Problems:   * No resolved hospital problems. *  Hospital Course: Chad [name] is a 68 y.o. male with medical history significant of HTN, HLD, DM, CAD, dCHf, hypothyroidism, gout, obesity, cirrhosis due to NASH, thrombocytopenia, trifascicular block, hepatorenal syndrome, who presents with nausea, dry heaves, upper abdominal pain, left lower chest pain, dizziness.  The main concern is left upper quadrant abdominal pain.  Pain started 5 days ago, getting worse for the last 2 days.  He was nauseated.  But no vomiting.  The pain seemed to be worse before the meal, better after eating. He had a mild elevation of lipase of 373, liver function changes.  He was diagnosed with acute pancreatitis.  Patient also had history of cholecystectomy. CT scan of abdomen/pelvis with contrast did not see any acute changes.  Condition more consistent with peptic ulcer disease, he was started on high-dose Protonix , sucralfate .  Condition improved, abdominal pain essentially resolved.  Medically stable for discharge.  Assessment and Plan: Left upper quadrant abdominal pain due to presumed peptic ulcer  disease. Mild pancreatitis. Chest pain.  Likely referred pain from upper quadrant. Patient has left upper quadrant abdominal pain, associate with nausea.  The pain seems to be worse before meals.  He has tenderness in the left upper quadrant.  Condition more consistent with a peptic ulcer disease.  He also has mild pancreatitis associated with it.  Patient had a CT scan of abdomen/pelvis with contrast, did not show any acute changes.  started Protonix  twice a day, sucralfate . Chest pain appeared to be referred pain from left upper quadrant.  Troponin was normal. After above treatment, patient abdominal pain essentially resolved.  Tolerating diet.  Medically stable for discharge.  Patient has appointment with GI, probably will need EGD to confirm diagnosis.     Chronic diastolic CHF (congestive heart failure) (HCC): Today: 11/16/2023 showed EF of 55%.   CHF seems to be compensated. Patient no longer has any dehydration, restart home diuretics.  Patient BNP was 24.9 at admission, no exacerbation of congestive heart failure. Resume all at home treatment.   Chronic shortness of breath, likely COPD. Patient had a multiple echocardiogram performed, ejection fraction was 55 to 60%, in 1 study, pulmonary arterial pressure was normal. Patient has chronic short of breath for years.  This could be secondary to COPD.  Patient was started on Breo, short of breath much better today.   Liver cirrhosis secondary to NASH [facility]): Mental status normal.  Ammonia 17.  INR 1.0, PTT 30. -Continue rifaximin  and lactulose  Patient has elevated AST/ALT, patient will be followed by PCP as outpatient. Mild elevation bilirubin, not a concern for CBD obstruction.   Near  syncope Frequent falls Orthostatic hypotension. Patient had a frequent falls from dizziness.  He feels dizzy with standing up.  In doctors office, he was found to have orthostatic hypotension.  Medication adjusted, Norvasc  discontinued.  Patient feels  better.  Still has cardiac monitoring device, will be followed with cardiology after completion.   HTN (hypertension):  Coreg  restarted.   Hyperlipidemia associated with type 2 diabetes mellitus (HCC) -Lipitor   Diabetes mellitus type 2 with retinopathy [facility]): Recent A1c 6.6, well-controlled.  Patient is taking Fox current Ozempic  Resume home treatment   Hypothyroidism -Synthroid    Thrombocytopenia: This is a chronic issue due to liver cirrhosis.  Platelets are 132   Gout -Continue home allopurinol  and colchicine    Hypokalemia:  Potassium normalized   Severe obesity (BMI 35.0-39.9) with comorbidity [facility]): Patient has Obesity Class II, with body weight 113.3 Kg and BMI 36.89  kg/m2.  Diet and exercise       Consultants: None Procedures performed: None  Disposition: Home health Diet recommendation:  Discharge Diet Orders (From admission, onward)     Start     Ordered   12/12/23 0000  Diet - low sodium heart healthy        12/12/23 1013           Cardiac diet DISCHARGE MEDICATION: Allergies as of 12/12/2023       Reactions   Metformin  And Related Diarrhea        Medication List     STOP taking these medications    amLODipine  2.5 MG tablet Commonly known as: NORVASC    dexlansoprazole  60 MG capsule Commonly known as: DEXILANT    famotidine  20 MG tablet Commonly known as: PEPCID    isosorbide  mononitrate 30 MG 24 hr tablet Commonly known as: IMDUR        TAKE these medications    albuterol  108 (90 Base) MCG/ACT inhaler Commonly known as: VENTOLIN  HFA Inhale 2 puffs into the lungs every 6 (six) hours as needed for wheezing or shortness of breath.   allopurinol  100 MG tablet Commonly known as: ZYLOPRIM  Take 1 tablet (100 mg total) by mouth every other day.   aspirin  EC 81 MG tablet Take 81 mg by mouth daily.   atorvastatin  20 MG tablet Commonly known as: LIPITOR Take 1 tablet (20 mg total) by mouth daily.   bumetanide  1 MG  tablet Commonly known as: BUMEX  Take 2 tablets (2 mg total) by mouth daily.   carvedilol  3.125 MG tablet Commonly known as: COREG  Take 3.125 mg by mouth 2 (two) times daily with a meal.   citalopram  20 MG tablet Commonly known as: CELEXA  Take 1 tablet (20 mg total) by mouth daily.   colchicine  0.6 MG tablet Take 1 tablet (0.6 mg total) by mouth daily as needed (gout flare).   cyanocobalamin  500 MCG tablet Commonly known as: VITAMIN B12 Take 1 tablet (500 mcg total) by mouth daily.   dapagliflozin  propanediol 10 MG Tabs tablet Commonly known as: FARXIGA  Take 10 mg by mouth daily before breakfast.   Ferrous Sulfate  27 MG Tabs Take 1 tablet by mouth daily with breakfast.   fluticasone  50 MCG/ACT nasal spray Commonly known as: FLONASE  Place 2 sprays into both nostrils daily.   fluticasone  furoate-vilanterol 200-25 MCG/ACT Aepb Commonly known as: BREO ELLIPTA Inhale 1 puff into the lungs daily. Start taking on: December 13, 2023   lactulose  10 GM/15ML solution Commonly known as: CHRONULAC  Take 30 mLs (20 g total) by mouth 2 (two) times daily.   levothyroxine  137  MCG tablet Commonly known as: SYNTHROID  Take 1 tablet (137 mcg total) by mouth daily before breakfast. TAKE ONE TAB BY MOUTH ONCE DAILY. TAKE ON AN EMPTY STOMACH WITH A GLASS OF WATER ATLEAST 30-60 MINUTES BEFORE BREAKFAST   magnesium  oxide 400 (240 Mg) MG tablet Commonly known as: MAG-OX Take 400 mg by mouth.   Metamucil 4 in 1 Fiber 55.6 % Powd Generic drug: Psyllium Take 1 Dose by mouth in the morning.   metoCLOPramide  5 MG tablet Commonly known as: REGLAN  TAKE ONE TABLET (5 MG TOTAL) BY MOUTH EVERY EIGHT HOURS AS NEEDED FOR NAUSEA.   nitroGLYCERIN  0.4 MG SL tablet Commonly known as: Nitrostat  Place 1 tablet (0.4 mg total) under the tongue every 5 (five) minutes as needed for chest pain.   One-A-Day Mens 50+ Tabs Take 1 tablet by mouth in the morning.   Ozempic  (2 MG/DOSE) 8 MG/3ML Sopn Generic  drug: Semaglutide  (2 MG/DOSE) Inject 2 mg into the skin once a week.   pantoprazole  40 MG tablet Commonly known as: Protonix  Take 1 tablet (40 mg total) by mouth 2 (two) times daily.   potassium chloride  SA 20 MEQ tablet Commonly known as: KLOR-CON  M Take 1 tablet (20 mEq total) by mouth daily. With bumex    rifaximin  550 MG Tabs tablet Commonly known as: XIFAXAN  Take 1 tablet (550 mg total) by mouth 2 (two) times daily.   spironolactone  25 MG tablet Commonly known as: ALDACTONE  Take 25 mg by mouth daily.   sucralfate  1 GM/10ML suspension Commonly known as: CARAFATE  Take 10 mLs (1 g total) by mouth 4 (four) times daily -  with meals and at bedtime for 7 days.   vitamin E  180 MG (400 UNITS) capsule Take 400 Units by mouth in the morning.        Contact information for follow-up providers     Rilla Baller, MD Follow up in 1 week(s).   Specialty: Family Medicine Contact information: [address] [place] [address] [phone]              Contact information for after-discharge care     Home Medical Care     [place] [facility]) .   Service: Home Health Services Contact information: [address] [place]  [address] [phone]                    Discharge Exam: Fredricka Weights   12/09/23 1540 12/09/23 2125 12/10/23 0506  Weight: 115.7 kg 113.3 kg 113.9 kg   General exam: Appears calm and comfortable  Respiratory system: Clear to auscultation. Respiratory effort normal. Cardiovascular system: S1 & S2 heard, RRR. No JVD, murmurs, rubs, gallops or clicks. No pedal edema. Gastrointestinal system: Abdomen is nondistended, soft and nontender. No organomegaly or masses felt. Normal bowel sounds heard. Central nervous system: Alert and oriented. No focal neurological deficits. Extremities: Symmetric 5 x 5 power. Skin: No rashes, lesions or ulcers Psychiatry: Judgement and insight appear  normal. Mood & affect appropriate.    Condition at discharge: good  The results of significant diagnostics from this hospitalization (including imaging, microbiology, ancillary and laboratory) are listed below for reference.   Imaging Studies: CT ABDOMEN PELVIS W CONTRAST Result Date: 12/10/2023 CLINICAL DATA:  Left upper quadrant pain.  Cirrhosis. EXAM: CT ABDOMEN AND PELVIS WITH CONTRAST TECHNIQUE: Multidetector CT imaging of the abdomen and pelvis was performed using the standard protocol following bolus administration of intravenous contrast. RADIATION DOSE REDUCTION: This  exam was performed according to the departmental dose-optimization program which includes automated exposure control, adjustment of the mA and/or kV according to patient size and/or use of iterative reconstruction technique. CONTRAST:  OMNIPAQUE  IOHEXOL  300 MG/ML  SOLN COMPARISON:  MRI on 02/14/2023 FINDINGS: Lower Chest: No acute findings. Stable tiny calcified granuloma in posterior lingula. Hepatobiliary: Cirrhosis again demonstrated. No suspicious hepatic masses identified. No ascites. Prior cholecystectomy. No evidence of biliary obstruction. Pancreas:  No mass or inflammatory changes. Spleen: Stable moderate splenomegaly, consistent with portal venous hypertension. No evidence of splenic rupture or perisplenic hematoma. Adrenals/Urinary Tract: Stable 2.3 cm fat attenuation right adrenal mass, consistent with benign myelolipoma (No followup imaging is recommended). No renal masses identified. No evidence of ureteral calculi or hydronephrosis. Stomach/Bowel: Surgical clips again seen along the wall of the gastric body. No mass identified. No evidence of obstruction, inflammatory process or abnormal fluid collections. Diverticulosis is seen mainly involving the sigmoid colon, however there is no evidence of diverticulitis. Vascular/Lymphatic: No pathologically enlarged lymph nodes. No acute vascular findings. Reproductive:  Mildly enlarged prostate gland, with median lobe hypertrophy indenting bladder base. Other:  Small fat-containing right inguinal hernia. Musculoskeletal:  No suspicious bone lesions identified. IMPRESSION: No acute findings. Cirrhosis. No evidence of hepatic neoplasm. Stable splenomegaly, consistent with portal venous hypertension. Colonic diverticulosis, without radiographic evidence of diverticulitis. Mildly enlarged prostate. Small fat-containing right inguinal hernia. Electronically Signed   By: Norleen DELENA Kil M.D.   On: 12/10/2023 16:20   DG Chest 2 View Result Date: 12/09/2023 CLINICAL DATA:  Chest pain radiating to back, shortness of breath EXAM: CHEST - 2 VIEW COMPARISON:  11/13/2023 FINDINGS: Frontal and lateral views of the chest demonstrate a stable cardiac silhouette. No acute airspace disease, effusion, or pneumothorax. No acute bony abnormalities. IMPRESSION: 1. No acute intrathoracic process. Electronically Signed   By: Ozell Daring M.D.   On: 12/09/2023 16:39   ECHOCARDIOGRAM LIMITED BUBBLE STUDY Result Date: 11/16/2023    ECHOCARDIOGRAM LIMITED REPORT   Patient Name:   Ryler E Wragg Date of Exam: 11/16/2023 Medical Rec #:  [id number]      Height:       69.0 in Accession #:    [id number]     Weight:       256.2 lb Date of Birth:  1955/11/28       BSA:          2.295 m Patient Age:    68 years       BP:           110/57 mmHg Patient Gender: M              HR:           74 bpm. Exam Location:  Inpatient Procedure: 2D Echo and Saline Contrast Bubble Study Indications:    Eval for shunting which might suggest hepatopulmonary syndrome  History:        Patient has prior history of Echocardiogram examinations. CHF,                 CAD; Risk Factors:Hypertension.  Sonographer:    Charmaine Gaskins Referring Phys: 4 JAY M PYRTLE IMPRESSIONS  1. Left ventricular ejection fraction, by estimation, is 55 to 60%. The left ventricle has normal function.  2. Right ventricular systolic function is mildly  reduced. The right ventricular size is normal.  3. The bubble study was positive late, on the 10th beat after bubbles appeared on the right. This suggests possible pulmonary AVMs.  4. Limited echo for bubble study. FINDINGS  Left Ventricle: Left ventricular ejection fraction, by estimation, is 55 to 60%. The left ventricle has normal function. Right Ventricle: The right ventricular size is normal. Right ventricular systolic function is mildly reduced. Left Atrium: Left atrial size was normal in size. Right Atrium: Right atrial size was normal in size. IAS/Shunts: The bubble study was positive late, on the 10th beat after bubbles appeared on the right. This suggests possible pulmonary AVMs. Agitated saline contrast was given intravenously to evaluate for intracardiac shunting. RIGHT VENTRICLE TAPSE (M-mode): 1.9 cm Dalton McleanMD Electronically signed by Ezra Kanner Signature Date/Time: 11/16/2023/4:28:43 PM    Final    ECHOCARDIOGRAM COMPLETE Result Date: 11/14/2023    ECHOCARDIOGRAM REPORT   Patient Name:   ALTA SHOBER Date of Exam: 11/14/2023 Medical Rec #:  [id number]      Height:       69.0 in Accession #:    [id number]     Weight:       254.0 lb Date of Birth:  01-15-1956       BSA:          2.287 m Patient Age:    68 years       BP:           111/64 mmHg Patient Gender: M              HR:           94 bpm. Exam Location:  Inpatient Procedure: 2D Echo, Cardiac Doppler, Color Doppler and Saline Contrast Bubble            Study (Both Spectral and Color Flow Doppler were utilized during            procedure). Indications:    Syncope R55  History:        Patient has prior history of Echocardiogram examinations, most                 recent 05/12/2023. CHF, CAD, Arrythmias:Bradycardia,                 Signs/Symptoms:Chest Pain and Syncope; Risk                 Factors:Hypertension, Sleep Apnea and Diabetes.  Sonographer:    Thea Norlander RCS Referring Phys: EMERY LITTIE FUSS IMPRESSIONS  1. Left ventricular  ejection fraction, by estimation, is 55 to 60%. The left ventricle has normal function. Left ventricular endocardial border not optimally defined to evaluate regional wall motion. There is mild concentric left ventricular hypertrophy. Indeterminate diastolic filling due to E-A fusion.  2. RV apical hypokinesis. Right ventricular systolic function is mildly reduced. The right ventricular size is mildly enlarged. Tricuspid regurgitation signal is inadequate for assessing PA pressure.  3. The mitral valve was not well visualized. No evidence of mitral valve regurgitation. No evidence of mitral stenosis.  4. The aortic valve is tricuspid. Aortic valve regurgitation is not visualized. No aortic stenosis is present.  5. The inferior vena cava is dilated in size with >50% respiratory variability, suggesting right atrial pressure of 8 mmHg. Comparison(s): No significant change from prior study. Prior images reviewed side by side. FINDINGS  Left Ventricle: Left ventricular ejection fraction, by estimation, is 55 to 60%. The left ventricle has normal function. Left ventricular endocardial border not optimally defined to evaluate regional wall motion. Definity  contrast agent was given IV to delineate the left ventricular endocardial borders. The left ventricular internal cavity size was  small. There is mild concentric left ventricular hypertrophy. Indeterminate diastolic filling due to E-A fusion. Right Ventricle: RV apical hypokinesis. The right ventricular size is mildly enlarged. No increase in right ventricular wall thickness. Right ventricular systolic function is mildly reduced. Tricuspid regurgitation signal is inadequate for assessing PA pressure. Left Atrium: Left atrial size was normal in size. Right Atrium: Right atrial size was normal in size. Pericardium: There is no evidence of pericardial effusion. Presence of epicardial fat layer. Mitral Valve: The mitral valve was not well visualized. No evidence of mitral  valve regurgitation. No evidence of mitral valve stenosis. Tricuspid Valve: The tricuspid valve is normal in structure. Tricuspid valve regurgitation is trivial. No evidence of tricuspid stenosis. Aortic Valve: The aortic valve is tricuspid. Aortic valve regurgitation is not visualized. No aortic stenosis is present. Aortic valve peak gradient measures 4.7 mmHg. Pulmonic Valve: The pulmonic valve was normal in structure. Pulmonic valve regurgitation is not visualized. No evidence of pulmonic stenosis. Aorta: The aortic root, ascending aorta, aortic arch and descending aorta are all structurally normal, with no evidence of dilitation or obstruction. Venous: The inferior vena cava is dilated in size with greater than 50% respiratory variability, suggesting right atrial pressure of 8 mmHg. IAS/Shunts: The atrial septum is grossly normal. Agitated saline contrast was given intravenously to evaluate for intracardiac shunting.  LEFT VENTRICLE PLAX 2D LVIDd:         3.80 cm   Diastology LVIDs:         2.50 cm   LV e' medial:    5.98 cm/s LV PW:         1.30 cm   LV E/e' medial:  11.9 LV IVS:        1.30 cm   LV e' lateral:   9.68 cm/s LVOT diam:     2.10 cm   LV E/e' lateral: 7.4 LV SV:         52 LV SV Index:   23 LVOT Area:     3.46 cm  RIGHT VENTRICLE             IVC RV S prime:     10.00 cm/s  IVC diam: 2.10 cm TAPSE (M-mode): 1.6 cm LEFT ATRIUM             Index        RIGHT ATRIUM           Index LA diam:        3.90 cm 1.71 cm/m   RA Area:     19.60 cm LA Vol (A2C):   42.3 ml 18.50 ml/m  RA Volume:   57.30 ml  25.06 ml/m LA Vol (A4C):   40.7 ml 17.80 ml/m LA Biplane Vol: 43.8 ml 19.15 ml/m  AORTIC VALVE AV Area (Vmax): 2.82 cm AV Vmax:        108.00 cm/s AV Peak Grad:   4.7 mmHg LVOT Vmax:      88.00 cm/s LVOT Vmean:     58.200 cm/s LVOT VTI:       0.149 m  AORTA Ao Root diam: 3.50 cm Ao Asc diam:  3.70 cm MITRAL VALVE MV Area (PHT): 3.42 cm    SHUNTS MV Decel Time: 222 msec    Systemic VTI:  0.15 m MV E  velocity: 71.30 cm/s  Systemic Diam: 2.10 cm MV A velocity: 93.80 cm/s MV E/A ratio:  0.76 Stanly Leavens MD Electronically signed by Stanly Leavens MD Signature Date/Time: 11/14/2023/3:16:47 PM  Final    CT Angio Chest/Abd/Pel for Dissection W and/or Wo Contrast Result Date: 11/13/2023 CLINICAL DATA:  Acute aortic syndrome suspected. Shortness of breath and chest pain for a few days. Recent falls. EXAM: CT ANGIOGRAPHY CHEST, ABDOMEN AND PELVIS TECHNIQUE: Noncontrast images were initially obtained to the chest. Multidetector CT imaging through the chest, abdomen and pelvis was performed using the standard protocol during bolus administration of intravenous contrast. Multiplanar reconstructed images and MIPs were obtained and reviewed to evaluate the vascular anatomy. RADIATION DOSE REDUCTION: This exam was performed according to the departmental dose-optimization program which includes automated exposure control, adjustment of the mA and/or kV according to patient size and/or use of iterative reconstruction technique. CONTRAST:  75mL OMNIPAQUE  IOHEXOL  350 MG/ML SOLN COMPARISON:  Chest radiograph 11/13/2023. CTA chest 10/15/2023. MRI abdomen 02/14/2023. CT abdomen and pelvis 10/10/2019 FINDINGS: CTA CHEST FINDINGS Cardiovascular: Unenhanced images of the chest demonstrate scattered calcification throughout the aorta and moderate calcification of the coronary arteries. Possible coronary stents. Calcification in the aortic valve. No evidence of acute intramural hematoma. Images obtained during the arterial phase after intravenous contrast material administration demonstrate normal caliber thoracic aorta. No aortic aneurysm or dissection. Great vessel origins are patent. Central pulmonary arteries are well opacified without evidence of acute pulmonary embolus. Normal heart size. No pericardial effusions. Mediastinum/Nodes: No enlarged mediastinal, hilar, or axillary lymph nodes. Thyroid  gland, trachea,  and esophagus demonstrate no significant findings. Lungs/Pleura: Lungs are clear. No pleural effusion or pneumothorax. Musculoskeletal: Degenerative changes in the thoracic spine. Postoperative changes in the lower cervical spine. Old rib fractures. No acute bony abnormalities. Review of the MIP images confirms the above findings. CTA ABDOMEN AND PELVIS FINDINGS VASCULAR Aorta: Normal caliber aorta without aneurysm, dissection, vasculitis or significant stenosis. Scattered aortic calcification. Celiac: Patent without evidence of aneurysm, dissection, vasculitis or significant stenosis. SMA: Patent without evidence of aneurysm, dissection, vasculitis or significant stenosis. Renals: Duplicated right and triplicated left renal arteries are somewhat diminutive but appear patent bilaterally. Nephrograms are symmetrical. IMA: Patent without evidence of aneurysm, dissection, vasculitis or significant stenosis. Inflow: Patent without evidence of aneurysm, dissection, vasculitis or significant stenosis. Veins: No obvious venous abnormality within the limitations of this arterial phase study. Review of the MIP images confirms the above findings. NON-VASCULAR Hepatobiliary: Hepatic cirrhosis with diffuse nodular contour enlargement of the lateral segment left and caudate lobes. No focal lesions are identified. Surgical absence of the gallbladder. Bile ducts are normal. Pancreas: Unremarkable. No pancreatic ductal dilatation or surrounding inflammatory changes. Spleen: Spleen is enlarged.  No focal lesions. Adrenals/Urinary Tract: 2.3 cm diameter right adrenal gland nodule containing macroscopic fat, unchanged since previous studies. This is consistent with a myelolipoma. No imaging follow-up is indicated. Kidneys are symmetrical. No focal solid lesions. No hydronephrosis or hydroureter. Bladder is normal. Stomach/Bowel: Stomach, small bowel, and colon are not abnormally distended. No wall thickening or inflammatory stranding  identified. Postoperative changes in the stomach. Appendix is not identified. Lymphatic: No significant lymphadenopathy. Reproductive: Prostate is unremarkable. Other: No abdominal wall hernia or abnormality. No abdominopelvic ascites. Musculoskeletal: Degenerative changes in the spine. No acute bony abnormalities. Review of the MIP images confirms the above findings. IMPRESSION: 1. Scattered calcification throughout the thoracoabdominal aorta consistent with aortic atherosclerosis. No aneurysm or dissection identified. 2. No large vessel occlusion. No evidence of significant pulmonary embolus. 3. No evidence of active pulmonary disease. 4. Hepatic cirrhosis with splenic enlargement. 5. Benign-appearing lesion in the right adrenal gland consistent with myelolipoma, unchanged. No imaging follow-up is indicated. Electronically  Signed   By: Elsie Gravely M.D.   On: 11/13/2023 16:22   DG Chest 2 View Result Date: 11/13/2023 CLINICAL DATA:  Shortness of breath and chest pain a few days. Recent falls. EXAM: CHEST - 2 VIEW COMPARISON:  11/07/2023 FINDINGS: Lungs are adequately inflated and otherwise clear. Cardiomediastinal silhouette is unremarkable. Small linear metallic device projects over the posterior left hilar region unchanged. Remainder the exam is unchanged. IMPRESSION: No acute cardiopulmonary disease. Electronically Signed   By: Toribio Agreste M.D.   On: 11/13/2023 12:30    Microbiology: Results for orders placed or performed during the hospital encounter of 02/13/20  SARS CORONAVIRUS 2 (TAT 6-24 HRS) Nasopharyngeal Nasopharyngeal Swab     Status: None   Collection Time: 02/13/20 10:53 AM   Specimen: Nasopharyngeal Swab  Result Value Ref Range Status   SARS Coronavirus 2 NEGATIVE NEGATIVE Final    Comment: (NOTE) SARS-CoV-2 target nucleic acids are NOT DETECTED.  The SARS-CoV-2 RNA is generally detectable in upper and lower respiratory specimens during the acute phase of infection.  Negative results do not preclude SARS-CoV-2 infection, do not rule out co-infections with other pathogens, and should not be used as the sole basis for treatment or other patient management decisions. Negative results must be combined with clinical observations, patient history, and epidemiological information. The expected result is Negative.  Fact Sheet for Patients: HairSlick.no  Fact Sheet for Healthcare Providers: quierodirigir.com  This test is not yet approved or cleared by the [place]  FDA and  has been authorized for detection and/or diagnosis of SARS-CoV-2 by FDA under an Emergency Use Authorization (EUA). This EUA will remain  in effect (meaning this test can be used) for the duration of the COVID-19 declaration under Se ction 564(b)(1) of the Act, 21 U.S.C. section 360bbb-3(b)(1), unless the authorization is terminated or revoked sooner.  Performed at [facility], [address], [place], [place] [address]    *Note: Due to a large number of results and/or encounters for the requested time period, some results have not been displayed. A complete set of results can be found in Results Review.    Labs: CBC: Recent Labs  Lab 12/09/23 1547 12/10/23 0422 12/11/23 0344  WBC 6.1 4.4 3.8*  HGB 15.1 13.9 13.7  HCT 42.8 42.2 42.7  MCV 85.9 90.4 92.0  PLT 132* 109* 102*   Basic Metabolic Panel: Recent Labs  Lab 12/09/23 1547 12/09/23 1807 12/10/23 0422 12/11/23 0344  NA 137  --  140 137  K 3.3*  --  3.5 3.7  CL 96*  --  102 102  CO2 24  --  26 25  GLUCOSE 328*  --  172* 124*  BUN 17  --  18 12  CREATININE 1.08  --  0.92 0.78  CALCIUM  8.5*  --  8.1* 8.0*  MG  --  2.7*  --  2.5*  PHOS  --  2.9  --   --    Liver Function Tests: Recent Labs  Lab 12/09/23 1547 12/10/23 0422 12/11/23 0344  AST 103* 84* 89*  ALT 84* 72* 66*  ALKPHOS 226* 188* 190*  BILITOT 2.0* 1.8* 1.4*  PROT 6.3* 5.8*  5.4*  ALBUMIN 3.7 3.4* 3.2*   CBG: Recent Labs  Lab 12/11/23 1205 12/11/23 1231 12/11/23 1624 12/11/23 2049 12/12/23 0837  GLUCAP 213* 188* 213* 244* 172*    Discharge time spent: 39 minutes.  Signed: Murvin Mana, MD [facility] 12/12/2023

## 2023-12-12 NOTE — Telephone Encounter (Signed)
 ERx

## 2023-12-12 NOTE — Telephone Encounter (Signed)
 Copied from CRM [id number]. Topic: Clinical - Prescription Issue >> Dec 12, 2023 12:08 PM Laymon HERO wrote: Reason for CRM: [facility] called stating they cannot dispense the medication the way it was written. She need a script for the whole bottle of 60 Please send again [phone]

## 2023-12-12 NOTE — TOC Initial Note (Signed)
 Transition of Care [facility]) - Initial/Assessment Note    Patient Details  Name: Chad [name] MRN: [id number] Date of Birth: Jun 24, 1955  [facility]) CM/SW Contact:    Corean ONEIDA Haddock, RN Phone Number: 12/12/2023, 10:03 AM  Clinical Narrative:                  Admitted [id number]  Admitted from: home with wife PCP: Rilla Current home health/prior home health/DME: [facility] and [facility] recommending HH.  Patient in agreement and states that he does not have a preference of agency.  [facility] accepted in [place] and notified [place] with [place].   Patient states his wife will transport at [employer]         Patient Goals and CMS Choice            Expected Discharge Plan and Services                                              Prior Living Arrangements/Services                       Activities of Daily Living   ADL Screening (condition at time of admission) Independently performs ADLs?: No Does the patient have a NEW difficulty with bathing/dressing/toileting/self-feeding that is expected to last >3 days?: Yes (Initiates electronic notice to provider for possible OT consult) Does the patient have a NEW difficulty with getting in/out of bed, walking, or climbing stairs that is expected to last >3 days?: Yes (Initiates electronic notice to provider for possible PT consult) Does the patient have a NEW difficulty with communication that is expected to last >3 days?: No Is the patient deaf or have difficulty hearing?: No Does the patient have difficulty seeing, even when wearing glasses/contacts?: No Does the patient have difficulty concentrating, remembering, or making decisions?: No  Permission Sought/Granted                  Emotional Assessment              Admission diagnosis:  Acute pancreatitis [K85.90] Pancreatitis [K85.90] Patient Active Problem List   Diagnosis Date Noted   Acute pancreatitis 12/09/2023   CAD (coronary  artery disease) 12/09/2023   Hypokalemia 12/09/2023   Near syncope 12/09/2023   Hepatopulmonary syndrome (HCC) 11/17/2023   Hypoxia 11/15/2023   (HFpEF) heart failure with preserved ejection fraction (HCC) 11/15/2023   Elevated troponin 11/15/2023   Portal hypertension (HCC) 11/14/2023   Constipation 11/14/2023   Syncope and collapse 11/13/2023   Leg cramping 05/15/2023   Chest pain 05/11/2023   Type 2 diabetes mellitus with other specified complication (HCC) 02/16/2023   Malaise and fatigue 07/25/2022   Gout 06/06/2022   Diminished pulses in lower extremity 01/16/2022   Anterior chest wall pain 03/19/2020   Elevated alkaline phosphatase level 03/19/2020   Statin intolerance 03/19/2020   Benign neoplasm of ascending colon    Abnormality of esophagus 11/12/2019   Atherosclerosis of aorta 10/13/2019   Iron deficiency 01/09/2019   DDD (degenerative disc disease), cervical 10/15/2018   Situational anxiety 05/01/2018   Asthma 12/08/2016   Bilateral hearing loss 11/17/2016   Primary osteoarthritis of left knee 06/08/2016   Diabetic neuropathy associated with type 2 diabetes mellitus (HCC) 04/04/2016   Carotid stenosis 12/21/2015   Thrombocytopenia 06/10/2015   Splenomegaly 05/07/2015  Advanced care planning/counseling discussion 04/28/2015   Myelolipoma of right adrenal gland 04/04/2015   Liver cirrhosis secondary to NASH (HCC) 04/03/2015   Cough 11/04/2013   Tinea corporis 11/04/2013   Central retinal vein occlusion with macular edema of left eye (HCC) 10/17/2013   Abnormal drug screen 09/21/2013   Bariatric surgery status 08/21/2013   Diabetic gastroparesis (HCC) 04/14/2013   Bradycardia 01/04/2013   Chronic diastolic CHF (congestive heart failure) (HCC) 04/02/2012   1st degree AV block 10/06/2011   Primary osteoarthritis of right knee 06/07/2011   Medicare annual wellness visit, subsequent 03/08/2011   Arthritis 01/31/2011   OSA (obstructive sleep apnea) 01/14/2011    Severe obesity (BMI 35.0-39.9) with comorbidity (HCC) 01/14/2011   CAD (coronary artery disease), native coronary artery 01/14/2011   Irregular heart beat 12/28/2010   Diabetes mellitus type 2 with retinopathy (HCC)    HTN (hypertension)    Hyperlipidemia associated with type 2 diabetes mellitus (HCC)    GERD (gastroesophageal reflux disease)    Seasonal allergies    Hypothyroidism    Colon polyps    PCP:  Rilla Baller, MD Pharmacy:   [facility] - [place], [place] - [address] [place] [address] Phone: [phone] Fax: [phone]  D[facility] - [facility] [address] [place] [address] Phone: [phone] Fax: [phone]     Social Drivers of Health (SDOH) Social History: SDOH Screenings   Food Insecurity: No Food Insecurity (12/09/2023)  Recent Concern: Food Insecurity - Food Insecurity Present (11/14/2023)  Housing: Low Risk  (12/09/2023)  Transportation Needs: No Transportation Needs (12/09/2023)  Utilities: Not At Risk (12/09/2023)  Alcohol Screen: Low Risk  (05/25/2023)  Depression (PHQ2-9): Low Risk  (11/24/2023)  Recent Concern: Depression (PHQ2-9) - Medium Risk (11/07/2023)  Financial Resource Strain: Low Risk  (05/25/2023)  Physical Activity: Insufficiently Active (05/25/2023)  Social Connections: Moderately Isolated (12/09/2023)  Stress: No Stress Concern Present (05/25/2023)  Tobacco Use: Low Risk  (12/09/2023)   SDOH Interventions:     Readmission Risk Interventions    12/12/2023   10:03 AM  Readmission Risk Prevention Plan  Transportation Screening Complete  HRI or Home Care Consult Complete  Palliative Care Screening Not Applicable  Medication Review (RN Care Manager) Complete

## 2023-12-13 ENCOUNTER — Other Ambulatory Visit (HOSPITAL_COMMUNITY): Payer: Self-pay

## 2023-12-13 ENCOUNTER — Telehealth: Payer: Self-pay

## 2023-12-13 DIAGNOSIS — K219 Gastro-esophageal reflux disease without esophagitis: Secondary | ICD-10-CM | POA: Diagnosis present

## 2023-12-13 DIAGNOSIS — I5032 Chronic diastolic (congestive) heart failure: Secondary | ICD-10-CM | POA: Diagnosis present

## 2023-12-13 DIAGNOSIS — I440 Atrioventricular block, first degree: Secondary | ICD-10-CM | POA: Diagnosis present

## 2023-12-13 DIAGNOSIS — E1143 Type 2 diabetes mellitus with diabetic autonomic (poly)neuropathy: Secondary | ICD-10-CM | POA: Diagnosis present

## 2023-12-13 DIAGNOSIS — K7581 Nonalcoholic steatohepatitis (NASH): Secondary | ICD-10-CM | POA: Diagnosis present

## 2023-12-13 DIAGNOSIS — K767 Hepatorenal syndrome: Secondary | ICD-10-CM | POA: Diagnosis present

## 2023-12-13 DIAGNOSIS — E669 Obesity, unspecified: Secondary | ICD-10-CM | POA: Diagnosis present

## 2023-12-13 DIAGNOSIS — Z79890 Hormone replacement therapy (postmenopausal): Secondary | ICD-10-CM | POA: Diagnosis not present

## 2023-12-13 DIAGNOSIS — I2510 Atherosclerotic heart disease of native coronary artery without angina pectoris: Secondary | ICD-10-CM | POA: Diagnosis not present

## 2023-12-13 DIAGNOSIS — Z6837 Body mass index (BMI) 37.0-37.9, adult: Secondary | ICD-10-CM | POA: Diagnosis not present

## 2023-12-13 DIAGNOSIS — R161 Splenomegaly, not elsewhere classified: Secondary | ICD-10-CM | POA: Diagnosis not present

## 2023-12-13 DIAGNOSIS — Z7982 Long term (current) use of aspirin: Secondary | ICD-10-CM | POA: Diagnosis not present

## 2023-12-13 DIAGNOSIS — I110 Hypertensive heart disease with heart failure: Secondary | ICD-10-CM | POA: Diagnosis present

## 2023-12-13 DIAGNOSIS — E785 Hyperlipidemia, unspecified: Secondary | ICD-10-CM | POA: Diagnosis present

## 2023-12-13 DIAGNOSIS — I5030 Unspecified diastolic (congestive) heart failure: Secondary | ICD-10-CM | POA: Diagnosis not present

## 2023-12-13 DIAGNOSIS — K3184 Gastroparesis: Secondary | ICD-10-CM | POA: Diagnosis present

## 2023-12-13 DIAGNOSIS — I509 Heart failure, unspecified: Secondary | ICD-10-CM | POA: Diagnosis not present

## 2023-12-13 DIAGNOSIS — I5020 Unspecified systolic (congestive) heart failure: Secondary | ICD-10-CM | POA: Diagnosis not present

## 2023-12-13 DIAGNOSIS — K7460 Unspecified cirrhosis of liver: Secondary | ICD-10-CM | POA: Diagnosis present

## 2023-12-13 DIAGNOSIS — I2584 Coronary atherosclerosis due to calcified coronary lesion: Secondary | ICD-10-CM | POA: Diagnosis present

## 2023-12-13 DIAGNOSIS — E039 Hypothyroidism, unspecified: Secondary | ICD-10-CM | POA: Diagnosis not present

## 2023-12-13 DIAGNOSIS — G4733 Obstructive sleep apnea (adult) (pediatric): Secondary | ICD-10-CM | POA: Diagnosis not present

## 2023-12-13 DIAGNOSIS — Z7951 Long term (current) use of inhaled steroids: Secondary | ICD-10-CM | POA: Diagnosis not present

## 2023-12-13 DIAGNOSIS — K766 Portal hypertension: Secondary | ICD-10-CM | POA: Diagnosis not present

## 2023-12-13 DIAGNOSIS — Z8719 Personal history of other diseases of the digestive system: Secondary | ICD-10-CM | POA: Diagnosis not present

## 2023-12-13 DIAGNOSIS — T82855A Stenosis of coronary artery stent, initial encounter: Secondary | ICD-10-CM | POA: Diagnosis present

## 2023-12-13 DIAGNOSIS — Z955 Presence of coronary angioplasty implant and graft: Secondary | ICD-10-CM | POA: Diagnosis not present

## 2023-12-13 DIAGNOSIS — E119 Type 2 diabetes mellitus without complications: Secondary | ICD-10-CM | POA: Diagnosis not present

## 2023-12-13 DIAGNOSIS — M109 Gout, unspecified: Secondary | ICD-10-CM | POA: Diagnosis present

## 2023-12-13 DIAGNOSIS — F39 Unspecified mood [affective] disorder: Secondary | ICD-10-CM | POA: Diagnosis present

## 2023-12-13 DIAGNOSIS — R079 Chest pain, unspecified: Secondary | ICD-10-CM | POA: Diagnosis not present

## 2023-12-13 DIAGNOSIS — I25118 Atherosclerotic heart disease of native coronary artery with other forms of angina pectoris: Secondary | ICD-10-CM | POA: Diagnosis not present

## 2023-12-13 DIAGNOSIS — I25110 Atherosclerotic heart disease of native coronary artery with unstable angina pectoris: Secondary | ICD-10-CM | POA: Diagnosis not present

## 2023-12-13 DIAGNOSIS — I25112 Atherosclerotic heart disease of native coronary artery with refractory angina pectoris: Secondary | ICD-10-CM | POA: Diagnosis present

## 2023-12-13 NOTE — Transitions of Care (Post Inpatient/ED Visit) (Signed)
 12/13/2023  Name: Chad [name] MRN: [id number] DOB: 1955-04-25  Today's TOC FU Call Status: Today's TOC FU Call Status:: Successful TOC FU Call Completed TOC FU Call Complete Date: 12/13/23 Patient's Name and Date of Birth confirmed.  Transition Care Management Follow-up Telephone Call Date of Discharge: 12/12/23 Discharge Facility: [facility]) Type of Discharge: Inpatient Admission Primary Inpatient Discharge Diagnosis:: acute pancreatitis How have you been since you were released from the hospital?: Worse Any questions or concerns?: Yes Patient Questions/Concerns:: patient reports having pain in his stomach and left side.  He reports feeling like an elephant is sitting on his chest, left arm pain and shortness of breath. Patient still able to talk. Patient burped x 2. Advised to call 911. Patient Questions/Concerns Addressed: Other: (patient advised to call 911 immediately.  Attempted to contact patients daughter, Camelia Capers who he states was in the back bedroom and attempted to call patients wife/DPR, Adolphe Fortunato by cell phone who he says was outside.)  Items Reviewed: Did you receive and understand the discharge instructions provided?: Yes Medications obtained,verified, and reconciled?: Partial Review Completed Reason for Partial Mediation Review: partial review of medications completed due to patient's report of not feeling well. Any new allergies since your discharge?: No Do you have support at home?: Yes People in Home [RPT]: spouse Name of Support/Comfort Primary Source: Donyel Nester  Medications Reviewed Today:   Medications Reviewed Today     Reviewed by Dsean Vantol E, RN (Registered Nurse) on 12/13/23 at 1356  Med List Status: <None>   Medication Order Taking? Sig Documenting Provider Last Dose Status Informant  albuterol  (VENTOLIN  HFA) 108 (90 Base) MCG/ACT inhaler [id number] Yes Inhale 2 puffs into the lungs every 6 (six)  hours as needed for wheezing or shortness of breath. [provider]  Active Child, Pharmacy Records  allopurinol  (ZYLOPRIM ) 100 MG tablet [id number] Yes Take 1 tablet (100 mg total) by mouth every other day. Rilla Baller, MD  Active Child, Pharmacy Records  aspirin  EC 81 MG tablet [id number] Yes Take 81 mg by mouth daily. [provider]  Active Pharmacy Records, Child  atorvastatin  (LIPITOR) 20 MG tablet [id number] Yes Take 1 tablet (20 mg total) by mouth daily. Rilla Baller, MD  Active Child, Pharmacy Records  bumetanide  (BUMEX ) 1 MG tablet [id number] Yes Take 2 tablets (2 mg total) by mouth daily. Rilla Baller, MD  Active Child, Pharmacy Records  carvedilol  (COREG ) 3.125 MG tablet [id number]  Take 3.125 mg by mouth 2 (two) times daily with a meal. [provider]  Active Child, Pharmacy Records  citalopram  (CELEXA ) 20 MG tablet [id number]  Take 1 tablet (20 mg total) by mouth daily. Rilla Baller, MD  Active Child, Pharmacy Records  colchicine  0.6 MG tablet [id number]  Take 1 tablet (0.6 mg total) by mouth daily as needed (gout flare). Rilla Baller, MD  Active Child, Pharmacy Records  dapagliflozin  propanediol (FARXIGA ) 10 MG TABS tablet [id number]  Take 10 mg by mouth daily before breakfast. Rilla Baller, MD  Active Pharmacy Records, Child  Ferrous Sulfate  27 MG TABS [id number]  Take 1 tablet by mouth daily with breakfast. [provider]  Active Child, Pharmacy Records  fluticasone  (FLONASE ) 50 MCG/ACT nasal spray [id number]  Place 2 sprays into both nostrils daily. Rilla Baller, MD  Active Child, Pharmacy Records  fluticasone  furoate-vilanterol [facility] ELLIPTA) 200-25 MCG/ACT AEPB [id number]  Inhale 1 puff into the lungs daily. Laurita Pillion, MD  Active   lactulose  (CHRONULAC ) 10 GM/15ML solution [id number]  Take 30 mLs (20 g total) by mouth 2 (two) times daily. Rilla Baller, MD  Active Child, Pharmacy Records  levothyroxine  (SYNTHROID )  137 MCG tablet [id number]  Take 1 tablet (137 mcg total) by mouth daily before breakfast. TAKE ONE TAB BY MOUTH ONCE DAILY. TAKE ON AN EMPTY STOMACH WITH A GLASS OF WATER ATLEAST 30-60 MINUTES BEFORE OFILIA Rilla Baller, MD  Active Child, Pharmacy Records  magnesium  oxide (MAG-OX) 400 (240 Mg) MG tablet [id number]  Take 400 mg by mouth. [provider]  Active Child, Pharmacy Records  metoCLOPramide  (REGLAN ) 5 MG tablet [id number]  TAKE ONE TABLET (5 MG TOTAL) BY MOUTH EVERY EIGHT HOURS AS NEEDED FOR NAUSEA. [place], PA-C  Active Child, Pharmacy Records  Multiple Vitamins-Minerals (ONE-A-DAY MENS 50+) TABS [id number]  Take 1 tablet by mouth in the morning. [provider]  Active Child, Pharmacy Records  nitroGLYCERIN  (NITROSTAT ) 0.4 MG SL tablet [id number]  Place 1 tablet (0.4 mg total) under the tongue every 5 (five) minutes as needed for chest pain. [place] LABOR, FNP  Active Pharmacy Records, Child  pantoprazole  (PROTONIX ) 40 MG tablet [id number]  Take 1 tablet (40 mg total) by mouth 2 (two) times daily. Laurita Pillion, MD  Active   potassium chloride  SA (KLOR-CON  M) 20 MEQ tablet [id number]  Take 1 tablet (20 mEq total) by mouth daily. With bumex  Rilla Baller, MD  Active Child, Pharmacy Records  Psyllium (METAMUCIL 4 IN 1 FIBER) 55.6 % POWD [id number]  Take 1 Dose by mouth in the morning. [provider]  Active Child, Pharmacy Records  rifaximin  (XIFAXAN ) 550 MG TABS tablet [id number]  TAKE ONE TABLET (550 MG TOTAL) BY MOUTH TWO TIMES DAILY. Rilla Baller, MD  Active   Semaglutide , 2 MG/DOSE, (OZEMPIC , 2 MG/DOSE,) 8 MG/3ML SOPN [id number]  Inject 2 mg into the skin once a week. Rilla Baller, MD  Active Child, Pharmacy Records  spironolactone  (ALDACTONE ) 25 MG tablet [id number]  Take 25 mg by mouth daily. [provider]  Active Pharmacy Records, Child  sucralfate  (CARAFATE ) 1 g tablet [id number]  Take 1 tablet (1 g total) by mouth 4 (four) times  daily -  with meals and at bedtime for 7 days. Laurita Pillion, MD  Active   vitamin B-12 (CYANOCOBALAMIN ) 500 MCG tablet [id number]  Take 1 tablet (500 mcg total) by mouth daily. Rilla Baller, MD  Active Pharmacy Records, Child  vitamin E  400 UNIT capsule [id number]  Take 400 Units by mouth in the morning. [provider]  Active Pharmacy Records, Child            Home Care and Equipment/Supplies: Were Home Health Services Ordered?: Yes Name of Home Health Agency:: patient unsure of agency name Has Agency set up a time to come to your home?: Yes First Home Health Visit Date: 12/13/23 Any new equipment or medical supplies ordered?: No  Functional Questionnaire: Do you need assistance with bathing/showering or dressing?: No Do you need assistance with meal preparation?: No Do you need assistance with eating?: No Do you have difficulty maintaining continence: No Do you need assistance with getting out of bed/getting out of a chair/moving?: No Do you have difficulty managing or taking your medications?: No  Follow up appointments reviewed: PCP Follow-up appointment confirmed?: Yes Date of PCP follow-up appointment?: 12/18/23 Follow-up Provider: Dr. Corwin Do you need transportation to your follow-up appointment?: No Do you understand care options if your condition(s) worsen?: Yes-patient verbalized understanding  Unable to complete assessment with patient due to reports  of not feeling well.  Patient hospitalized from 12/09/23 to 12/12/23 for acute pancreatitis.  He states he went outside earlier today and got on his lawnmower.  Patient states his wife advised him that he shouldn't be on the lawnmower after just getting home from the hospital yesterday. Patient states he helped his wife get on the lawnmower and he went back inside the house.   Patient currently complaining of stomach and left side pain.  He states his chest feels like an elephant is sitting on it, he has left arm  pain and he is short of breath.  Advised patient to call 911 immediately.  Attempted to call patients daughter, Camelia Capers who he said was back in her bedroom and his wife/DPR, Tyrice Hewitt who he stated was outside on the lawnmower.  Patient gave verbal authorization to speak with his daughter regarding his personal health information.  Unable to contact wife or daughter. HIPAA compliant voice messages left.   Received return call from patients daughter Camelia Capers.  HIPAA verified for patient. Daughter states patient is not in any distress.  She states patient decided not to call 911 and stated would have his wife take him to [facility] ED once she gets off of the lawnmower.  Daughter states this has happened a few times before with him. She states she cannot promise that patient will actually go to the  ED.  Advised again that patient should call 911 to be evaluated.    Arvin Seip RN, BSN, CCM [employer], Population Health Case Manager Phone: [phone]

## 2023-12-16 NOTE — Discharge Summary (Signed)
 ------------------------------------------------------------------------------- Attestation with edits by Myer Rollo BIRCH, MD at 12/16/23 2156 I saw and evaluated the patient, participating in the key portions of the service on the day of discharge. I reviewed the resident's note and agree with the discharge plans and disposition. I was available and supervised the time spent by the resident in work on the day of discharge. We spent over 30 minutes in discharge planning services. All documented time was specific to this E/M visit and does not include any procedures that may have been performed.   LOIS Edsel Myer, MD Division of [facility] Medicine  -------------------------------------------------------------------------------   Physician Discharge Summary HBR 2 DT HBR [address] [place] [address] Dept: [phone] Loc: [phone]   Identifying Information:  Chad [name] November 01, 1955 [id number]  Primary Care Physician: Rilla Baller, MD   Code Status: Full Code  Admit Date: 12/13/2023  Discharge Date: 12/16/2023   Discharge To: Home  Discharge Service: [facility] - General Medicine Floor Team (MED [facility])   Discharge Attending Physician: Rollo BIRCH Myer, MD  Discharge Diagnoses:  Principal Problem:   Chest pain (POA: Yes) Active Problems:   Essential hypertension (POA: Yes)   CAD (coronary artery disease) (POA: Yes)   Morbid obesity (CMS-HCC) (POA: Yes)   Liver cirrhosis secondary to NASH (CMS-HCC) (POA: Yes)   Diabetic neuropathy associated with type 2 diabetes mellitus (CMS-HCC) (POA: Yes)   Chronic diastolic heart failure (CMS-HCC) (POA: Yes) Resolved Problems:   * No resolved hospital problems. *   Hospital Course:   Chad [name] is a 68 y.o. male who presented to [facility] with Chest pain, in the setting of the following pertinent/contributing co-morbidities: HFpEF (EF >55%), CAD s/p PCI to mLAD (12/2016), HTN,  T2DM, HLD, cirrhosis 2/2 NASH, Hypothyroidism, GERD. His hospital course is detailed below.   Chest Pain I CAD s/p PCI to mLAD (12/2016)  Coronary Microvascular Disease Mr. Ferg presented with sudden onset, persistent left sided chest pain radiating to left shoulder and back in the setting of known microvascular coronary dysfunction. Notably  with most recent [facility] 04/2023 at [facility] with patient LAD stent, 40% mid LAD disease just proximal; coronary physiology testing with elevated IMR suggesting  microvascular dysfunction.  On this admissions repeat EKG without changes and troponin was within normal limits on every check during hospital stay.  Pain improved somewhat by nitroglycerin  but did require use of opioids during admission for pain control.  He underwent left heart catheterization on 10/24 which showed similar in-stent stenosis of the LAD stent previously placed but not with significant impact on coronary flow.  No further intervention was performed as this lesion was not felt to be contributory to chest pain.  Also noted to have 1 jailed diagonal branch of LAD that also was not felt to be likely contributory.  Discussed with cardiology who recommended up titration of antianginals likely was best therapy and recommended consideration of referral to Dr. Rollene ([facility]) for EECP therapy by PCP after discharge.  Due to low blood pressures Imdur  was not able to be uptitrated.  However he was started on metoprolol  in place of his existing carvedilol  in the setting of his significant anginal discomfort and was able to ambulate on the day of discharge without any significant chest pain.  He was instructed to notify his GI doctors given the correct use for esophageal varices prophylaxis.  (Last EGD on file in 2021 without known varices).  He was referred for expedited follow-up with cardiology clinic with a  5-day supply of oxycodone .   HFpEF (EF >55%)  HTN  Nocturnal Hypoxia Established with [facility] (LOV  12/06/23 with Calton Helena, NP). Most recent TTE (11/16/23) with normal LV function. Has a CardioMEMs. Near dry weight of ~254 lbs. Appears euvolemic, stable on room air. Will continue home regimen. Did desat overnight on 10/24 but suspect possible OSA as he was in the high 90s on room air in the AM and was able to ambulate in halls without desaturation.    Recent History of Acute Pancreatitis Recently discharged from [facility] with pancreatitis just 2 days prior to presentation. He endorsed some mild abdominal pain during admission but had good PO intake.   Cirrhosis 2/2 NASH: Followed by GI at [facility] (Dr. Gordy Starch). Elevated LFTs w/ portal hypertension & cirrhosis. Continued lactulose  and Rifaximin .  DM2: Jardiance provided in place of home farxiga  10mg  daily.  Diabetic Gastroparesis: Metoclopramide  5mg  TID AC.  Hypothyroidism: Continued home synthroid  137 mcg daily GERD: continued pantoprazole  40mg  BID Mood: Continued Celexa  20mg  daily  Gout: Allopurinol  100mg  daily.   Outpatient Provider Follow Up Issues:  [ ]  Ensure cardiology follow up [ ]  Consider chronic pain clinic referral for likely need for ongoing opioid treatment [ ]  Ensure GI follow up for ongoing management of Cirrhosis  Touchbase with Outpatient Provider: Warm Handoff: Not completed secondary to weekend discharge. Fax to be sent on Monday  Procedures: cardiac catheterization ______________________________________________________________________ Discharge Medications:    Your Medication List     PAUSE taking these medications    OZEMPIC  0.25 mg or 0.5 mg(2 mg/1.5 mL) Pnij injection Wait to take this until your doctor or other care provider tells you to start again. Inject 0.5 mg under the skin every seven (7) days.       STOP taking these medications    carvedilol  3.125 MG tablet Commonly known as: COREG        START taking these medications    metoPROLOL  succinate 25 MG 24 hr  tablet Commonly known as: Toprol -XL Take 2 tablets (50 mg total) by mouth daily. Start taking on: December 17, 2023   oxyCODONE  5 MG immediate release tablet Commonly known as: ROXICODONE  Take 1 tablet (5 mg total) by mouth every six (6) hours as needed for up to 5 days.       CONTINUE taking these medications    acetaminophen  500 MG tablet Commonly known as: TYLENOL  Take 2 tablets (1,000 mg total) by mouth every eight (8) hours as needed.   albuterol  90 mcg/actuation inhaler Commonly known as: PROVENTIL  HFA;VENTOLIN  HFA Inhale 2 puffs every six (6) hours as needed for wheezing.   allopurinol  100 MG tablet Commonly known as: ZYLOPRIM  Take 1 tablet (100 mg total) by mouth daily.   amlodipine  2.5 MG tablet Commonly known as: NORVASC  Take 1 tablet (2.5 mg total) by mouth daily. TAKE 1 TABLET (2.5 MG TOTAL) BY MOUTH DAILY.   aspirin  81 MG tablet Commonly known as: ECOTRIN Take 1 tablet (81 mg total) by mouth daily.   atorvastatin  20 MG tablet Commonly known as: LIPITOR TAKE 1 TABLET BY MOUTH ONCE DAILY   BREO ELLIPTA 200-25 mcg/dose inhaler Generic drug: fluticasone  furoate-vilanterol Inhale 1 puff daily.   bumetanide  1 MG tablet Commonly known as: BUMEX  Take 2 tablets (2 mg total) by mouth in the morning.   bumetanide  1 MG tablet Commonly known as: BUMEX  Take 1 tablet (1 mg total) by mouth in the evening.   calcium  carbonate-vitamin D3 600 mg-10 mcg (400  unit) per tablet Take 1 tablet by mouth Two (2) times a day with meals.   citalopram  20 MG tablet Commonly known as: CeleXA  Take 1 tablet (20 mg total) by mouth in the morning.   colchicine  0.6 mg tablet Commonly known as: COLCRYS  TAKE ONE TABLET (0.6 MG TOTAL) BY MOUTH DAILY AS NEEDED (GOUT FLARE). MAY TAKE TWO TABLETS ON FIRST DAY OF GOUT FLARE   cyanocobalamin  500 MCG tablet Take 1 tablet (500 mcg total) by mouth daily.   diclofenac  sodium 1 % gel Commonly known as: VOLTAREN  Apply 2 g topically.    FARXIGA  10 mg Tab tablet Generic drug: dapagliflozin  propanediol TAKE ONE TABLET BY MOUTH EVERY MORNING   ferrous sulfate  325 (65 FE) MG EC tablet Take 1 tablet (325 mg total) by mouth 3 (three) times a week.   fluticasone  propionate 50 mcg/actuation nasal spray Commonly known as: FLONASE  1 spray into each nostril daily as needed. prn   isosorbide  mononitrate 30 MG 24 hr tablet Commonly known as: IMDUR  Take 1 tablet (30 mg total) by mouth daily.   lactulose  10 gram/15 mL solution Take 30 mL (20 g total) by mouth two (2) times a day.   levothyroxine  137 mcg Cap Take 1 capsule by mouth daily.   magnesium  oxide 400 mg (241.3 mg elemental) tablet Commonly known as: MAG-OX Take 1 tablet (400 mg total) by mouth daily.   metoclopramide  5 MG tablet Commonly known as: REGLAN    metOLazone  2.5 MG tablet Commonly known as: ZAROXOLYN  TAKE AS NEEDED UP TO 3 TIMES WEEKLY WHENINSTRUCTED BY CARDIOLOGY CLINIC.   multivitamin per tablet Commonly known as: TAB-A-VITE/THERAGRAN Take 1 tablet by mouth daily.   nitroglycerin  0.4 MG SL tablet Commonly known as: NITROSTAT  Place 1 tablet (0.4 mg total) under the tongue every five (5) minutes as needed for chest pain.   pantoprazole  40 MG tablet Commonly known as: Protonix  Take 1 tablet (40 mg total) by mouth Two (2) times a day (30 minutes before a meal).   pen needle, diabetic 31 gauge x 1/4 (6 mm) Ndle USE AS DIRECTED FOR LANTUS  SOLOSTAR PEN   pen needle, diabetic 31 gauge x 1/4 (6 mm) Ndle Need follow up scheduled in the next 1 to 2 months USE AS DIRECTED FOR LANTUS  SOLOSTAR PEN   potassium chloride  10 MEQ CR tablet Commonly known as: KLOR-CON  Take 1 tablet (10 mEq total) by mouth in the morning.   rifAXIMin  550 mg Tab Commonly known as: XIFAXAN  Take 1 tablet (550 mg total) by mouth two (2) times a day.   spironolactone  25 MG tablet Commonly known as: ALDACTONE  TAKE ONE TABLET BY MOUTH ONCE A DAY   vitamin E -268 mg (400  UNIT) 268 mg (400 UNIT) capsule Take 1 capsule (268 mg total) by mouth daily.        Allergies: Biguanides ______________________________________________________________________ Pending Test Results:   Most Recent Labs: All lab results last 24 hours -  Recent Results (from the past 24 hours)  POCT Glucose   Collection Time: 12/15/23  5:04 PM  Result Value Ref Range   Glucose, POC 114 70 - 179 mg/dL  POCT Glucose   Collection Time: 12/15/23  9:32 PM  Result Value Ref Range   Glucose, POC 156 70 - 179 mg/dL  Phosphorus Level   Collection Time: 12/16/23  6:03 AM  Result Value Ref Range   Phosphorus 4.9 2.4 - 5.1 mg/dL  Magnesium  Level   Collection Time: 12/16/23  6:03 AM  Result Value Ref  Range   Magnesium  2.1 1.6 - 2.6 mg/dL  Basic Metabolic Panel   Collection Time: 12/16/23  6:03 AM  Result Value Ref Range   Sodium 141 135 - 145 mmol/L   Potassium 3.4 (L) 3.5 - 5.1 mmol/L   Chloride 96 (L) 98 - 107 mmol/L   CO2 30.8 20.0 - 31.0 mmol/L   Anion Gap 14 5 - 14 mmol/L   BUN 14 9 - 23 mg/dL   Creatinine 8.98 9.26 - 1.18 mg/dL   BUN/Creatinine Ratio 14    eGFR CKD-EPI (2021) Male 81 >=60 mL/min/1.12m2   Glucose 113 70 - 179 mg/dL   Calcium  8.9 8.7 - 10.4 mg/dL  CBC   Collection Time: 12/16/23  6:03 AM  Result Value Ref Range   WBC 4.4 3.6 - 11.2 10*9/L   RBC 4.89 4.26 - 5.60 10*12/L   HGB 14.8 12.9 - 16.5 g/dL   HCT 57.4 60.9 - 51.9 %   MCV 87.0 77.6 - 95.7 fL   MCH 30.4 25.9 - 32.4 pg   MCHC 34.9 32.0 - 36.0 g/dL   RDW 86.5 87.7 - 84.7 %   MPV 9.7 6.8 - 10.7 fL   Platelet 113 (L) 150 - 450 10*9/L  POCT Glucose   Collection Time: 12/16/23  8:20 AM  Result Value Ref Range   Glucose, POC 233 (H) 70 - 179 mg/dL  POCT Glucose   Collection Time: 12/16/23 11:31 AM  Result Value Ref Range   Glucose, POC 160 70 - 179 mg/dL    Relevant Studies/Radiology: Cath/Vascular Procedure Result Date: 12/15/2023 Images from the original result were not included. Cardiac  Catheterization Laboratory [facility]  [place], [place] FINAL CARDIAC CATHETERIZATION REPORT Name: Chad [name] Medical record number: [id number] Date of birth: Aug 28, 1955 Date of procedure: 12/15/2023 ___________________________________________________________________________ _ FINDINGS: Right dominant anatomy Patent LAD stent Intermediate severity narrowing within the proximal-mid LAD and jailed first diagonal branch Non-significant FFR reduction within the LAD and jailed D1 branch. Right radial artery access RECOMMENDATIONS: Standard post-procedural care Continue secondary cardiovascular disease risk prevention Continue treatment of microvascular dysfunction and consider non-cardiac causes of chest pain If patient has refractory symptoms despite medical therapy and no other cause of pain identified, can consider repeat cath at the main campus with consideration of LAD/D1 bifurcation intervention. COMPLICATIONS: None ___________________________________________________________________________ _ Referring Physician: Self, Referred Primary Care Physician: Rilla Baller Cardiologist: Justino (inpatient cardiology) PROCEDURALISTS: Maude Rocks, MD, MPH (attending) INDICATION: Camry Theiss is a 68 y.o. male with history of type 2 diabetes, dyslipidemia, HFpEF, hypertension, cirrhosis, and CAD with prior LAD PCI. He has chronic angina despite the absence of significant known obstructive disease. Prior outside hospital cardiac catheterization (04/2023) notable for the following: LM: Normal LAD: Mid 40% disease just proximal to patent stent with no restenosis Lcx: OM1 with 20% disease Ramus: No significant disease RCA: Minimal luminal irregularities Left ventricular pressure: LVEDP 9 mmHg Coronary physiologic assessment (LAD): RFR: 0.96 (normal >0.91) FFR: 0.89 (normal >0.8) CFR: 3.4 (normal >2.5) IMR: 45 (normal < 25) He is currently admitted to [facility] with unstable angina. Due to refractory chest pain he is referred for coronary angiography +/- PCI. PROCEDURES: Right radial artery access (68F Slender sheath) Coronary angiography Left heart catheterization Coronary physiologic assessment (LAD) Contrast Volume: 40 mL Omnipaque  Medications: - Antiplatelet therapy: 325 mg aspirin  was given prior to the procedure - Sedation: Midazolam  (1 mg), Fentanyl  (50 mcg) - Antispasmodic medications: 2 mg Verapamil  -  Anticoagulation: 6,000 units unfractionated heparin  (intravenous) - Intracoronary: 100 mcg NTG, and systemic adenosine  for coronary flow assessment Radiation: - Fluoro time: 5.9 minutes - Air Kerma Dose: 619 mGy - DAP: 54714 Gy-cm2 ___________________________________________________________________________ PHARES SEARLES DETAILS: Prior to cath lab arrival, the risks, benefits and alternatives of the procedure were explained and discussed. All questions were answered and/or addressed. Signed, informed consent was obtained. The patient was then prepped and draped in the usual sterile fashion. A procedural time-out was performed verifying patient identity, pertinent clinical information (including but not limited to allergies, medications, past medical history, indications for the procedure, and diagnostic data), procedural plan, and procedural personnel. Conscious sedation was achieved using a combination of midazolam  and fentanyl . Continuous monitoring of the patient's vitals, heart rhythm (telemetry), level of comfort, and level of arousal was performed throughout the duration of the procedure. ARTERIAL ACCESS: 5/22F Slender right radial artery - 1% lidocaine  was administered to the subcutaneous tissue of the wrist and targeted arterial puncture site - Using manual palpation (and ultrasound guidance), radial artery access was obtained with a micropuncture needle and a 0.018 inch wire. Over the 0.018 inch wire, a 5/22F Slender sheath advanced  without difficulty. - Verapamil  was administered through the arterial sheath to help prevent arterial spasm and unfractionated heparin  was administered intravenously. - Post-procedure, the arterial sheath was removed and a TR Band was applied to achieve hemostasis. There was a palpable distal radial pulse and good capillary refill present. CORONARY ANGIOGRAPHY: - Over a 0.035 inch J-wire a 3F Jacky catheter was advanced into the ascending aorta. - 3F Jacky catheter was used for selective right and left coronary cannulation and angiography - Continuous hemodynamic monitoring was observed to ensure adequate pressure waveforms and systemic blood pressure - Selective coronary angiography was performed in multiple orthogonal views using hand injections. LEFT HEART CATHETERIZATION: - Using the 3F Jacky catheter and the 0.035 inch J-wire the aortic valve was traversed and the catheter was advanced into the left ventricle. The 0.035 inch J-wire was removed and the catheter was aspirated and flushed appropriately. - Left ventricular pressure, Left ventricular end-diastolic pressure, transaortic valvular gradient, and aortic pressure were documented. The patient tolerated the procedure well without any complications. ___________________________________________________________________________ _ FINDINGS HEMODYNAMICS AND LEFT VENTRICULAR PRESSURE: Opening aortic pressure: 131/69 mm Hg (mean, 88 mmHg) Left ventricular pressure 96/24 mmHg (LVEDP,  6 mmHg) Transaortic gradient: None Heart rate: 70 bpm CORONARY ANGIOGRAPHY: Dominance: RIGHT Left Main: The left main coronary artery (LMCA) is a large-caliber vessel that originates from the left coronary sinus. It bifurcates into the left anterior descending (LAD) and left circumflex (LCx) arteries. There is no significant angiographic disease in the left main but there is mild diffuse calcification. LAD: The LAD is a large-caliber vessel that extends to the inferoapical segment.  There is one large diagonal branch that is jailed by the previously deployed LAD stent. There is ostial 80-90% stenosis of the jailed D1 branch and TIMI 3 flow. The proximal LAD has diffuse calcific 30-40% narrowing leading into the proximal-mid aspect of the stent giving the appearance of 40-60% narrowing from stent under-expansion. No significant in-stent restenosis is appreciated by angiography. The remainder of the LAD has mild diffuse angiographic narrowing. There is TIMI 3 flow throughout. Left Circumflex: The LCx is a medium to large caliber non-dominant vessel that provides a small OM1 (or ramus), medium OM2 and a small distal AV groove LCFx extension. The LCFx and its branches have mild diffuse angiographic disease. Right Coronary: The right coronary  artery (RCA) is a large-caliber dominant vessel originating from the right coronary sinus. The RCA provides a a large rPDA but no significant PLV system. There is mild calcific narrowing in the proximal (20-30%) and mid vessel (20-%).  CORONARY FLOW ASSESSMENT: - UFH (6,000 units) - 93F Ikari Left 3.5 guide used to engage the left main - 100 mcg intracoronary NTG was given - 0.014 inch PressureWire X (Abbott) was normalized to guide pressure - The wire was directed into the mid/distal LAD. The dFR in the distal-LAD was borderline reduced (0.9-0.91). Systemic adenosine  was then initiated (140 mcg/kg/min).  FFR in the distal-LAD was non-significant (0.89). - The wire was then directed into the D1. The lowest obtained FFR was non-significant (0.88). FFR within the proximal LAD was >0.95. - With wire pullback into the left main and guide catheter, the FFR normalized. - Final angiography demonstrated stable anatomy and the absence of complications I personally spent 25 minutes , continuously monitoring the patient face to face during the administration of moderate sedation. Independent observer RN was present for the duration of the procedure to assist in patient  monitoring. Pre and post sedation activities have been reviewed. ___________________________________________________________________________ _ I was present for the entire duration of the procedure, as detailed above. Maude Rocks, MD, MPH Interventional Cardiology Professor, Cardiovascular Medicine [employer] [place], [place] [address]  ECG 12 Lead Result Date: 12/14/2023 SINUS RHYTHM WITH 1ST DEGREE AV BLOCK RIGHT BUNDLE BRANCH BLOCK LEFT ANTERIOR FASCICULAR BLOCK BIFASCICULAR BLOCK ABNORMAL ECG WHEN COMPARED WITH ECG OF 13-Dec-2023 17:25, NO SIGNIFICANT CHANGE WAS FOUND Confirmed by Antonetta Gull ([id number]) on 12/14/2023 1:03:01 PM  ECG 12 Lead Result Date: 12/14/2023 SINUS RHYTHM WITH 1ST DEGREE AV BLOCK RIGHT BUNDLE BRANCH BLOCK LEFT ANTERIOR FASCICULAR BLOCK BIFASCICULAR BLOCK POSSIBLE LATERAL INFARCT  (CITED ON OR BEFORE 06-Dec-2023) ABNORMAL ECG WHEN COMPARED WITH ECG OF 06-Dec-2023 12:27, QRS AXIS SHIFTED RIGHT Confirmed by Antonetta Gull ([id number]) on 12/14/2023 12:58:14 PM  ECG 12 Lead Result Date: 12/14/2023 SINUS RHYTHM WITH 1ST DEGREE AV BLOCK LEFT AXIS DEVIATION RIGHT BUNDLE BRANCH BLOCK ABNORMAL ECG WHEN COMPARED WITH ECG OF 13-Dec-2023 21:36, NO SIGNIFICANT CHANGE WAS FOUND  ECG 12 Lead Result Date: 12/14/2023 SINUS RHYTHM WITH 1ST DEGREE AV BLOCK LEFT AXIS DEVIATION RIGHT BUNDLE BRANCH BLOCK ABNORMAL ECG WHEN COMPARED WITH ECG OF 13-Dec-2023 21:36, NO SIGNIFICANT CHANGE WAS FOUND  ECG 12 Lead Result Date: 12/13/2023 SINUS RHYTHM WITH 1ST DEGREE AV BLOCK RIGHT BUNDLE BRANCH BLOCK LEFT ANTERIOR FASCICULAR BLOCK BIFASCICULAR BLOCK ABNORMAL ECG WHEN COMPARED WITH ECG OF 13-Dec-2023 15:11, QT HAS LENGTHENED Confirmed by Von Shawl ([id number]) on 12/13/2023 8:49:10 PM  CTA Chest/Abd (Aortic Dissection) Result Date: 12/13/2023 EXAM: CTA Chest, Abdomen, Pelvis for Aortic Dissection DATE: 12/13/2023 5:06 PM ACCESSION: [id number] UN DICTATED: 12/13/2023 5:18 PM  INTERPRETATION LOCATION: MAIN CAMPUS CLINICAL INDICATION: 68 years old Male with ongoing chest pain with radiation to the back  COMPARISON: CTA CHEST W CONTRAST 03/30/2018. TECHNIQUE: A helical CT of the chest was obtained without IV contrast from the thoracic inlet through the hemidiaphragms. Images were reconstructed in the axial plane. Next, a spiral CTA  of the chest, abdomen and pelvis was obtained with IV contrast from the thoracic inlet through the aortic bifurcation. Images were reconstructed in the axial plane.  Multiplanar reformatted and MIP images are provided for further evaluation of the aorta. FINDINGS: AORTA: Normal caliber aorta. No thoracic aortic intramural hematoma.  No aortic dissection. Mild scattered calcified and noncalcified atherosclerotic plaque. CHEST: Severe  coronary artery atherosclerotic calcification. Normal heart size.  No pericardial effusion. No mediastinal lymphadenopathy. Calcified nodule in the left thyroid  lobe measuring 0.7 cm (5:20). Mild pulmonary trunk dilation measuring 3.2 cm. (5:137) Clear central airways. No consolidation.  No pleural effusion. ABDOMEN and PELVIS: HEPATOBILIARY: Cirrhotic liver morphology. No focal hepatic lesions. The gallbladder is surgically absent. No biliary dilatation. Subhepatic calcified nodules measuring 1.2 cm (5:333), likely calcified lymph nodes. SPLEEN: Enlarged measuring 17 cm. Few scattered parenchymal calcifications, likely granulomas. PANCREAS: Unremarkable. ADRENALS: Fat-containing lesion in the right adrenal gland measuring 2.3 x 2.0 cm favored represent an adrenal myolipoma. KIDNEYS/URETERS: Symmetric nephrograms. No hydronephrosis or nephrolithiasis BLADDER: Partially decompressed limiting its evaluation. PELVIC/REPRODUCTIVE ORGANS: The prostate is borderline enlarged measuring 3.4 x 4.8 x 4.7 cm (5:633, 8:73) GI TRACT: Status post gastric bypass surgery. No dilated or thick walled loops of bowel. Calcified sigmoid colonic  diverticula. No evidence of diverticulitis. PERITONEUM/RETROPERITONEUM AND MESENTERY: No free air or fluid. LYMPH NODES: No enlarged lymph nodes. BONES: Remote left posterior sixth and right posterior sixth-eighth rib fractures. Mild multilevel degenerative changes of the spine. Changes of diffuse idiopathic skeletal hyperostosis throughout the thoracic spine. SOFT TISSUES: Small right fat-containing inguinal hernia. No surrounding inflammatory changes. Scattered lower anterior abdominal wall hyperdense lesions favoring to represent injection site granulomas.   No acute intrathoracic or intra-abdominal abnormality. Specifically, no evidence of acute aortic syndrome or pulmonary embolism. Severe coronary artery calcifications. Mild pulmonary trunk dilation which can be seen with pulmonary arterial hypertension. Cirrhotic liver morphology with sequela portal hypertension including splenomegaly.  XR Chest 2 views Result Date: 12/13/2023 EXAM: XR CHEST 2 VIEWS ACCESSION: [id number] UN REPORT DATE: 12/13/2023 4:10 PM CLINICAL INDICATION: CHEST PAIN ; Chest Pain  TECHNIQUE: PA and Lateral Chest Radiographs COMPARISON: Chest radiograph 05/08/2023 and 12/22/2021 FINDINGS: CardioMEMS device in the left interlobar pulmonary artery. Zio patch overlies left chest wall. Post ACDF of the lower cervical spine. Lungs are clear.  No pleural effusion or pneumothorax. Cardiac silhouette is normal in size. Thoracic aorta is tortuous with calcifications. Chronic bilateral rib deformities.   No acute cardiopulmonary abnormalities.   ______________________________________________________________________ Discharge Instructions:  Activity Instructions     Activity as tolerated        You were admitted to [facility] with chest pain. You underwent a left heart catheterization which showed no new major blockages in your heart or signs of a heart attack. We believe your chest pain is likely related to small vessel changes  called microvascular changes that can lead to pain. We are discharging you on a new medication called metoprolol  which may help reduce your symptoms. Please stop your carvedilol  medicine on discharge since that works in a similar way. You may have a higher risk of bleeding in your esophagus while on the metoprolol  compared to carvedilol  due to your cirrhosis. Be sure to discuss this with your GI doctor next time you see them.  We are also sending you home on oxycodone  5 mg which can help with your pain. If your pain is not relieved by oxycodone  or nitroglycerin  please seek additional medical care.    You are now ready to discharge and will be discharging to: Home  Please take your medications as prescribed. A full list of medications with any changes is in this discharge packet. Please keep your follow-up appointments after the hospital for ongoing care. It has been a pleasure taking care of you, we wish you the best.   MEDICATION CHANGES: -- These are detailed in this discharge  packet including new medications to take, previous medications to stop or change, and previous medications to continue.  FOLLOW-UP: -- It is important to follow-up after your hospital discharge, you should follow-up with your primary care doctor as scheduled next Tuesday. Discuss with him consideration of ongoing use of the oxycodone . Our cardiology team also plans to see you in clinic as detailed in this discharge document very soon. We also think you would benefit from seeing a [facility] cardiology specialist named Dr. Jerilynn Cleveland for your heart issues for a therapy called EECP (enhanced external conterupulsation) therapy. You can have your primary are doctor send a referral to him.      Follow Up instructions and Outpatient Referrals    Ambulatory Referral to Cardiac Rehab     Reason for Cardiac Rehab Referral: Angina pectoris, stable, current   Call MD for:  difficulty breathing, headache or visual disturbances      Call MD for:  persistent nausea or vomiting     Call MD for:  severe uncontrolled pain     Call MD for:  temperature >38.5 Celsius     Discharge instructions       Appointments which have been scheduled for you    Dec 20, 2023 4:00 PM (Arrive by 3:45 PM) RETURN CARDIOLOGY with Calton Idell [place], AGNP [facility] [address] [facility] REGION) [address] [place] [address] [phone]        ______________________________________________________________________ Discharge Day Services: BP 111/59   Pulse 83   Temp 36.6 C (97.9 F) (Oral)   Resp 18   Ht 175.3 cm (5' 9)   Wt (!) 114.9 kg (253 lb 4.8 oz)   SpO2 94%   BMI 37.41 kg/m   Pt seen on the day of discharge and determined appropriate for discharge.  Condition at Discharge: good  Length of Discharge: I spent less than 30 mins in the discharge of this patient.  Redell Devonshire PGY-3 [facility] Internal Medicine

## 2023-12-18 ENCOUNTER — Telehealth: Payer: Self-pay | Admitting: *Deleted

## 2023-12-18 ENCOUNTER — Inpatient Hospital Stay: Admitting: Family

## 2023-12-18 NOTE — Transitions of Care (Post Inpatient/ED Visit) (Signed)
 12/18/2023  Name: Chad [name] MRN: [id number] DOB: 01/19/1956  Today's TOC FU Call Status: Today's TOC FU Call Status:: Successful TOC FU Call Completed TOC FU Call Complete Date: 12/18/23 Patient's Name and Date of Birth confirmed.  Transition Care Management Follow-up Telephone Call Date of Discharge: 12/16/23 Discharge Facility: Other [facility]) Name of Other (Non[facility]) Discharge Facility: [facility] Primary Inpatient Discharge Diagnosis:: Chest pain How have you been since you were released from the hospital?: Better (still having a little chest pain/ RN explained that if pain not relieved by the Oxy,gets worse he needs to go back to ER) Any questions or concerns?: Yes Patient Questions/Concerns:: Patient didn't know what enhanced external conterupulsation therapy. Patient Questions/Concerns Addressed: Other: (RN explained what EECP was and how it is done.)  Items Reviewed: Did you receive and understand the discharge instructions provided?: Yes Medications obtained,verified, and reconciled?: Yes (Medications Reviewed) Any new allergies since your discharge?: No Dietary orders reviewed?: No Do you have support at home?: Yes People in Home [RPT]: spouse Name of Support/Comfort Primary Source: [place]  Medications Reviewed Today: Medications Reviewed Today     Reviewed by Kennieth Cathlean DEL, RN (Case Manager) on 12/18/23 at 1423  Med List Status: <None>   Medication Order Taking? Sig Documenting Provider Last Dose Status Informant  albuterol  (VENTOLIN  HFA) 108 (90 Base) MCG/ACT inhaler [id number] Yes Inhale 2 puffs into the lungs every 6 (six) hours as needed for wheezing or shortness of breath. [provider]  Active Child, Pharmacy Records  allopurinol  (ZYLOPRIM ) 100 MG tablet [id number] Yes Take 1 tablet (100 mg total) by mouth every other day. Rilla Baller, MD  Active Child, Pharmacy Records  aspirin  EC 81 MG tablet [id number] Yes Take 81 mg  by mouth daily. [provider]  Active Pharmacy Records, Child  atorvastatin  (LIPITOR) 20 MG tablet [id number] Yes Take 1 tablet (20 mg total) by mouth daily. Rilla Baller, MD  Active Child, Pharmacy Records  bumetanide  (BUMEX ) 1 MG tablet [id number] Yes Take 2 tablets (2 mg total) by mouth daily. Rilla Baller, MD  Active Child, Pharmacy Records  carvedilol  (COREG ) 3.125 MG tablet [id number]  Take 3.125 mg by mouth 2 (two) times daily with a meal.  Patient not taking: Reported on 12/18/2023   [provider]  Active Child, Pharmacy Records  citalopram  (CELEXA ) 20 MG tablet [id number] Yes Take 1 tablet (20 mg total) by mouth daily. Rilla Baller, MD  Active Child, Pharmacy Records  colchicine  0.6 MG tablet [id number] Yes Take 1 tablet (0.6 mg total) by mouth daily as needed (gout flare). Rilla Baller, MD  Active Child, Pharmacy Records  dapagliflozin  propanediol (FARXIGA ) 10 MG TABS tablet [id number] Yes Take 10 mg by mouth daily before breakfast. Rilla Baller, MD  Active Pharmacy Records, Child  Ferrous Sulfate  27 MG TABS [id number] Yes Take 1 tablet by mouth daily with breakfast. [provider]  Active Child, Pharmacy Records  fluticasone  (FLONASE ) 50 MCG/ACT nasal spray [id number] Yes Place 2 sprays into both nostrils daily. Rilla Baller, MD  Active Child, Pharmacy Records  fluticasone  furoate-vilanterol [facility] ELLIPTA) 200-25 MCG/ACT AEPB [id number] Yes Inhale 1 puff into the lungs daily. Laurita Pillion, MD  Active   lactulose  (CHRONULAC ) 10 GM/15ML solution [id number] Yes Take 30 mLs (20 g total) by mouth 2 (two) times daily. Rilla Baller, MD  Active Child, Pharmacy Records  levothyroxine  (SYNTHROID ) 137 MCG tablet [id number] Yes Take 1 tablet (137 mcg total) by mouth daily before breakfast. TAKE ONE TAB BY MOUTH  ONCE DAILY. TAKE ON AN EMPTY STOMACH WITH A GLASS OF WATER ATLEAST 30-60 MINUTES BEFORE OFILIA Rilla Baller, MD  Active Child,  Pharmacy Records  magnesium  oxide (MAG-OX) 400 (240 Mg) MG tablet [id number] Yes Take 400 mg by mouth. [provider]  Active Child, Pharmacy Records  metoCLOPramide  (REGLAN ) 5 MG tablet [id number] Yes TAKE ONE TABLET (5 MG TOTAL) BY MOUTH EVERY EIGHT HOURS AS NEEDED FOR NAUSEA. [place], PA-C  Active Child, Pharmacy Records  metoprolol  succinate (TOPROL -XL) 25 MG 24 hr tablet [id number] Yes Take 50 mg by mouth daily. [provider]  Active   Multiple Vitamins-Minerals (ONE-A-DAY MENS 50+) TABS [id number] Yes Take 1 tablet by mouth in the morning. [provider]  Active Child, Pharmacy Records  nitroGLYCERIN  (NITROSTAT ) 0.4 MG SL tablet [id number] Yes Place 1 tablet (0.4 mg total) under the tongue every 5 (five) minutes as needed for chest pain. [place] LABOR, FNP  Active Pharmacy Records, Child  oxyCODONE  (OXY IR/ROXICODONE ) 5 MG immediate release tablet [id number] Yes Take 5 mg by mouth every 6 (six) hours as needed for severe pain (pain score 7-10). as needed for up to 5 days. [provider]  Active   pantoprazole  (PROTONIX ) 40 MG tablet [id number] Yes Take 1 tablet (40 mg total) by mouth 2 (two) times daily. Laurita Pillion, MD  Active   potassium chloride  SA (KLOR-CON  M) 20 MEQ tablet [id number] Yes Take 1 tablet (20 mEq total) by mouth daily. With bumex  Rilla Baller, MD  Active Child, Pharmacy Records  Psyllium (METAMUCIL 4 IN 1 FIBER) 55.6 % POWD [id number] Yes Take 1 Dose by mouth in the morning. [provider]  Active Child, Pharmacy Records  rifaximin  (XIFAXAN ) 550 MG TABS tablet [id number] Yes TAKE ONE TABLET (550 MG TOTAL) BY MOUTH TWO TIMES DAILY. Rilla Baller, MD  Active   Semaglutide , 2 MG/DOSE, (OZEMPIC , 2 MG/DOSE,) 8 MG/3ML SOPN [id number]  Inject 2 mg into the skin once a week. Rilla Baller, MD  Active Child, Pharmacy Records           Med Note LORICE CATHLEAN VEAR Pablo Dec 18, 2023  2:18 PM) Pause  spironolactone  (ALDACTONE )  25 MG tablet [id number] Yes Take 25 mg by mouth daily. [provider]  Active Pharmacy Records, Child  sucralfate  (CARAFATE ) 1 g tablet [id number] Yes Take 1 tablet (1 g total) by mouth 4 (four) times daily -  with meals and at bedtime for 7 days. Laurita Pillion, MD  Active            Med Note LORICE, CATHLEAN VEAR Pablo Dec 18, 2023  2:15 PM) PAUSE  vitamin B-12 (CYANOCOBALAMIN ) 500 MCG tablet [id number] Yes Take 1 tablet (500 mcg total) by mouth daily. Rilla Baller, MD  Active Pharmacy Records, Child  vitamin E  400 UNIT capsule [id number] Yes Take 400 Units by mouth in the morning. [provider]  Active Pharmacy Records, Child            Home Care and Equipment/Supplies: Were Home Health Services Ordered?: NA Has Agency set up a time to come to your home?: No Any new equipment or medical supplies ordered?: NA  Functional Questionnaire: Do you need assistance with bathing/showering or dressing?: No Do you need assistance with meal preparation?: No Do you need assistance with eating?: No Do you have difficulty maintaining continence: No Do you need assistance with getting out of bed/getting out of a chair/moving?: No Do you have difficulty managing or taking  your medications?: No  Follow up appointments reviewed: PCP Follow-up appointment confirmed?: Yes Date of PCP follow-up appointment?: 12/19/23 Follow-up Provider: Dr [facility] Follow-up appointment confirmed?: Yes Date of Specialist follow-up appointment?: 12/20/23 Follow-Up Specialty Provider:: [address] [facility]/[id number] Dr Albertus gastro Do you need transportation to your follow-up appointment?: No Do you understand care options if your condition(s) worsen?: Yes-patient verbalized understanding  SDOH Interventions Today    Flowsheet Row Most Recent Value  SDOH Interventions   Food Insecurity Interventions Intervention Not Indicated  Housing Interventions Intervention  Not Indicated  Transportation Interventions Intervention Not Indicated, Patient Resources (Friends/Family)  Utilities Interventions Intervention Not Indicated   Per patient he was having some chest pain. Patient stated that he just  took some oxycodone . He would see if he felt any better. Instructed patient to go to ED if symptom persists. RN discussed enhanced external conterupulsation therapy.  Discussed and offered 30 day TOC program.  Patient declined. The patient has been provided with contact information for the care management team and has been advised to call with any health -related questions or concerns.  The patient verbalized understanding with current plan of care.  The patient is directed to their insurance card regarding availability of benefits coverage   Cathlean Headland BSN RN [facility] Health Care Management Coordinator [email or web address] Direct Dial: [phone]  Fax: [phone] Website: [email or web address]

## 2023-12-19 ENCOUNTER — Ambulatory Visit (INDEPENDENT_AMBULATORY_CARE_PROVIDER_SITE_OTHER): Admitting: Family Medicine

## 2023-12-19 ENCOUNTER — Other Ambulatory Visit (HOSPITAL_COMMUNITY): Payer: Self-pay

## 2023-12-19 ENCOUNTER — Encounter: Payer: Self-pay | Admitting: Family Medicine

## 2023-12-19 ENCOUNTER — Telehealth: Payer: Self-pay | Admitting: Family Medicine

## 2023-12-19 VITALS — BP 110/58 | HR 65 | Temp 97.7°F | Ht 69.0 in | Wt 251.4 lb

## 2023-12-19 DIAGNOSIS — K7581 Nonalcoholic steatohepatitis (NASH): Secondary | ICD-10-CM

## 2023-12-19 DIAGNOSIS — E119 Type 2 diabetes mellitus without complications: Secondary | ICD-10-CM | POA: Diagnosis not present

## 2023-12-19 DIAGNOSIS — I5032 Chronic diastolic (congestive) heart failure: Secondary | ICD-10-CM

## 2023-12-19 DIAGNOSIS — I2510 Atherosclerotic heart disease of native coronary artery without angina pectoris: Secondary | ICD-10-CM

## 2023-12-19 DIAGNOSIS — K7681 Hepatopulmonary syndrome: Secondary | ICD-10-CM

## 2023-12-19 DIAGNOSIS — K7469 Other cirrhosis of liver: Secondary | ICD-10-CM | POA: Diagnosis not present

## 2023-12-19 DIAGNOSIS — K766 Portal hypertension: Secondary | ICD-10-CM | POA: Diagnosis not present

## 2023-12-19 DIAGNOSIS — F331 Major depressive disorder, recurrent, moderate: Secondary | ICD-10-CM

## 2023-12-19 DIAGNOSIS — K8500 Idiopathic acute pancreatitis without necrosis or infection: Secondary | ICD-10-CM

## 2023-12-19 DIAGNOSIS — G8929 Other chronic pain: Secondary | ICD-10-CM | POA: Diagnosis not present

## 2023-12-19 DIAGNOSIS — E1169 Type 2 diabetes mellitus with other specified complication: Secondary | ICD-10-CM

## 2023-12-19 DIAGNOSIS — R079 Chest pain, unspecified: Secondary | ICD-10-CM

## 2023-12-19 DIAGNOSIS — Z9884 Bariatric surgery status: Secondary | ICD-10-CM

## 2023-12-19 MED ORDER — BLOOD GLUCOSE MONITORING SUPPL DEVI: 1.0000 | 0 refills | Status: AC

## 2023-12-19 MED ORDER — BLOOD GLUCOSE TEST VI STRP: 1.0000 | ORAL_STRIP | 0 refills | Status: AC

## 2023-12-19 MED ORDER — LANCETS MISC: 1.0000 | 0 refills | Status: AC

## 2023-12-19 MED ORDER — ACCU-CHEK SOFTCLIX LANCET DEV KIT
PACK | 0 refills | Status: AC
  Filled 2023-12-19 – 2024-03-04 (×2): qty 1, 30d supply, fill #0

## 2023-12-19 MED ORDER — LANCET DEVICE MISC: 1.0000 | 0 refills | Status: AC

## 2023-12-19 MED ORDER — ESCITALOPRAM OXALATE 10 MG PO TABS: 10.0000 mg | ORAL_TABLET | Freq: Every day | ORAL | 6 refills | Status: AC

## 2023-12-19 MED ORDER — SUCRALFATE 1 GM/10ML PO SUSP: 1.0000 g | Freq: Three times a day (TID) | ORAL | 0 refills | Status: AC

## 2023-12-19 NOTE — Telephone Encounter (Signed)
 Copied from CRM [id number]. Topic: General - Other >> Dec 19, 2023  8:40 AM Eva FALCON wrote: Reason for CRM: Rogue from [facility] called in and states he went to see patient, he is doing okay, walks without walker or cane, started driving, states no skilled services are needed.

## 2023-12-19 NOTE — Assessment & Plan Note (Addendum)
 Worsening depression/anxiety in setting of illnesses.  On celexa  20mg  for last several months - will change to lexapro 10mg  daily with option to titrate if needed. Reassess at f/u visit.  Avoid SNRIs in liver disease.

## 2023-12-19 NOTE — Progress Notes (Addendum)
 Ph: [phone] Fax: [phone]   Patient ID: Chad [name], male    DOB: [birth year], 68 y.o.   MRN: [id number]  This visit was conducted in person.  BP (!) 110/58   Pulse 65   Temp 97.7 F (36.5 C) (Oral)   Ht 5' 9 (1.753 m)   Wt 251 lb 6 oz (114 kg)   SpO2 97%   BMI 37.12 kg/m    CC: hosp f/u visit  Subjective:   HPI: Chad [name] is a 68 y.o. male presenting on 12/19/2023 for Hospitalization Follow-up (Pt was admitted to the hospital for Chest Pain./Pt accompanied by his wife Chad [name])   Recent hospitalization to [facility] for chest pain in known HFpEF, CAD s/p PCI to mLAD (2018) with known microvascular dysfunction,  HTN, HLD, DM, NASH cirrhosis, hypothyroid and GERD.  Chest pain was responsive to nitroglycerin  however also was treated with opioids.  Hospital records reviewed. Med rec performed.  Underwent LHC 12/15/2023 - similar in-stent stenosis of LAD stent without significant impact on coronary flow. Cardiology recommended up titration of anti-anginal medication. Metoprolol  succinate 25mg  2 tablets (50mg ) daily was started to replace carvedilol .  Thought chest pains due to microvascular dysfunction - was recommended oxycodone  5mg  QID PRN for when chest pain develops - and if ongoing chest pain despite this, to go to ER. He is using opiate very sparingly.   Continues with cardioMEMS use. Near dry weight ~254lbs.   Previous hospitalization at [facility]  12/09/2023 - 12/12/2023 with acute pancreatitis - now off Ozempic  2mg  weekly.  He was started on pantoprazole  40mg  bid as well as sucralfate  - he has been unable to swallow these large tablets. Will try liquid sucralfate .  He was started on fluticasone /vilanterol 200/25mcg 1 puff daily.   Recent diagnosis of hepatopulmonary syndrome - pending GI f/u with Dr Albertus scheduled for 01/09/2024.  Lactulose  twice daily leading to 4 soft to liquid BMs/day.  No recent abd pain or nausea, notes ongoing intermittent  chest discomfort.   Home health - set up with [facility] - s/p evaluation today - no skilled services are needed.  Other follow up appointments scheduled: cardiology appt tomorrow.  ______________________________________________________________________ Hospital admission: 12/13/2023 Hospital discharge: 12/16/2023 TCM f/u phone call:  performed on 12/18/2023  Discharge Diagnoses:  Principal Problem: Chest pain (POA: Yes) Active Problems: Essential hypertension (POA: Yes) CAD (coronary artery disease) (POA: Yes) Morbid obesity (CMS-HCC) (POA: Yes) Liver cirrhosis secondary to NASH (CMS-HCC) (POA: Yes) Diabetic neuropathy associated with type 2 diabetes mellitus (CMS-HCC) (POA: Yes) Chronic diastolic heart failure (CMS-HCC) (POA: Yes)      Relevant past medical, surgical, family and social history reviewed and updated as indicated. Interim medical history since our last visit reviewed. Allergies and medications reviewed and updated. Outpatient Medications Prior to Visit  Medication Sig Dispense Refill   albuterol  (VENTOLIN  HFA) 108 (90 Base) MCG/ACT inhaler Inhale 2 puffs into the lungs every 6 (six) hours as needed for wheezing or shortness of breath.     allopurinol  (ZYLOPRIM ) 100 MG tablet Take 1 tablet (100 mg total) by mouth every other day. 45 tablet 3   amLODipine  (NORVASC ) 2.5 MG tablet Take 1 tablet (2.5 mg total) by mouth daily.     aspirin  EC 81 MG tablet Take 81 mg by mouth daily.     atorvastatin  (LIPITOR) 20 MG tablet Take 1 tablet (20 mg total) by mouth daily. 90 tablet 3   bumetanide  (BUMEX ) 1 MG tablet Take 2 tablets (2 mg total)  by mouth daily.     colchicine  0.6 MG tablet Take 1 tablet (0.6 mg total) by mouth daily as needed (gout flare). 30 tablet 3   dapagliflozin  propanediol (FARXIGA ) 10 MG TABS tablet Take 10 mg by mouth daily before breakfast.     Ferrous Sulfate  27 MG TABS Take 1 tablet by mouth daily with breakfast.     fluticasone  (FLONASE ) 50 MCG/ACT nasal spray  Place 2 sprays into both nostrils daily. 16 g 12   fluticasone  furoate-vilanterol (BREO ELLIPTA) 200-25 MCG/ACT AEPB Inhale 1 puff into the lungs daily. 60 each 0   isosorbide  mononitrate (IMDUR ) 30 MG 24 hr tablet Take 1 tablet (30 mg total) by mouth daily.     lactulose  (CHRONULAC ) 10 GM/15ML solution Take 30 mLs (20 g total) by mouth 2 (two) times daily. 946 mL 1   lactulose , encephalopathy, (CHRONULAC ) 10 GM/15ML SOLN Take 20 g by mouth.     levothyroxine  (SYNTHROID ) 137 MCG tablet Take 1 tablet (137 mcg total) by mouth daily before breakfast. TAKE ONE TAB BY MOUTH ONCE DAILY. TAKE ON AN EMPTY STOMACH WITH A GLASS OF WATER ATLEAST 30-60 MINUTES BEFORE BREAKFAST 90 tablet 3   magnesium  oxide (MAG-OX) 400 (240 Mg) MG tablet Take 400 mg by mouth.     metoCLOPramide  (REGLAN ) 5 MG tablet TAKE ONE TABLET (5 MG TOTAL) BY MOUTH EVERY EIGHT HOURS AS NEEDED FOR NAUSEA. 90 tablet 1   metoprolol  succinate (TOPROL -XL) 25 MG 24 hr tablet Take 50 mg by mouth daily.     Multiple Vitamins-Minerals (ONE-A-DAY MENS 50+) TABS Take 1 tablet by mouth in the morning.     nitroGLYCERIN  (NITROSTAT ) 0.4 MG SL tablet Place 1 tablet (0.4 mg total) under the tongue every 5 (five) minutes as needed for chest pain. 60 tablet 3   oxyCODONE  (OXY IR/ROXICODONE ) 5 MG immediate release tablet Take 5 mg by mouth every 6 (six) hours as needed for severe pain (pain score 7-10). as needed for up to 5 days.     pantoprazole  (PROTONIX ) 40 MG tablet Take 1 tablet (40 mg total) by mouth 2 (two) times daily. 60 tablet 0   potassium chloride  SA (KLOR-CON  M) 20 MEQ tablet Take 1 tablet (20 mEq total) by mouth daily. With bumex      Psyllium (METAMUCIL 4 IN 1 FIBER) 55.6 % POWD Take 1 Dose by mouth in the morning.     rifaximin  (XIFAXAN ) 550 MG TABS tablet TAKE ONE TABLET (550 MG TOTAL) BY MOUTH TWO TIMES DAILY. 60 tablet 0   spironolactone  (ALDACTONE ) 25 MG tablet Take 25 mg by mouth daily.     vitamin B-12 (CYANOCOBALAMIN ) 500 MCG tablet  Take 1 tablet (500 mcg total) by mouth daily.     vitamin E  400 UNIT capsule Take 400 Units by mouth in the morning.     carvedilol  (COREG ) 3.125 MG tablet Take 3.125 mg by mouth 2 (two) times daily with a meal.     citalopram  (CELEXA ) 20 MG tablet Take 1 tablet (20 mg total) by mouth daily. 90 tablet 3   Semaglutide , 2 MG/DOSE, (OZEMPIC , 2 MG/DOSE,) 8 MG/3ML SOPN Inject 2 mg into the skin once a week. 9 mL 3   sucralfate  (CARAFATE ) 1 g tablet Take 1 tablet (1 g total) by mouth 4 (four) times daily -  with meals and at bedtime for 7 days. (Patient taking differently: Take 1 g by mouth 4 (four) times daily.) 28 tablet 0   No facility-administered medications prior to visit.  Per HPI unless specifically indicated in ROS section below Review of Systems  Objective:  BP (!) 110/58   Pulse 65   Temp 97.7 F (36.5 C) (Oral)   Ht 5' 9 (1.753 m)   Wt 251 lb 6 oz (114 kg)   SpO2 97%   BMI 37.12 kg/m   Wt Readings from Last 3 Encounters:  12/19/23 251 lb 6 oz (114 kg)  12/10/23 251 lb 1.7 oz (113.9 kg)  11/24/23 255 lb 2 oz (115.7 kg)      Physical Exam Vitals and nursing note reviewed.  Constitutional:      Appearance: Normal appearance. He is not ill-appearing.     Comments: Tired appearing  HENT:     Head: Normocephalic and atraumatic.     Mouth/Throat:     Mouth: Mucous membranes are moist.     Pharynx: Oropharynx is clear. No oropharyngeal exudate or posterior oropharyngeal erythema.  Eyes:     Extraocular Movements: Extraocular movements intact.     Pupils: Pupils are equal, round, and reactive to light.  Cardiovascular:     Rate and Rhythm: Normal rate and regular rhythm.     Pulses: Normal pulses.     Heart sounds: Normal heart sounds. No murmur heard. Pulmonary:     Effort: Pulmonary effort is normal. No respiratory distress.     Breath sounds: Normal breath sounds. No wheezing, rhonchi or rales.  Musculoskeletal:     Right lower leg: No edema.     Left lower leg:  No edema.  Skin:    General: Skin is warm and dry.     Findings: No rash.  Neurological:     Mental Status: He is alert.  Psychiatric:        Mood and Affect: Mood is anxious.        Behavior: Behavior normal.     Comments: Tearful with discussion of recent hospitalizations and medical conditions        Lab Results  Component Value Date   NA 137 12/11/2023   CL 102 12/11/2023   K 3.7 12/11/2023   CO2 25 12/11/2023   BUN 12 12/11/2023   CREATININE 0.78 12/11/2023   GFRNONAA >60 12/11/2023   CALCIUM  8.0 (L) 12/11/2023   PHOS 2.9 12/09/2023   ALBUMIN 3.2 (L) 12/11/2023   GLUCOSE 124 (H) 12/11/2023    Lab Results  Component Value Date   WBC 3.8 (L) 12/11/2023   HGB 13.7 12/11/2023   HCT 42.7 12/11/2023   MCV 92.0 12/11/2023   PLT 102 (L) 12/11/2023    Lab Results  Component Value Date   ALT 66 (H) 12/11/2023   AST 89 (H) 12/11/2023   GGT 176 (H) 08/03/2015   ALKPHOS 190 (H) 12/11/2023   BILITOT 1.4 (H) 12/11/2023    Lab Results  Component Value Date   HGBA1C 6.6 (H) 11/15/2023    Assessment & Plan:   Problem List Items Addressed This Visit     Severe obesity (BMI 35.0-39.9) with comorbidity (HCC)   Monitor weight off ozempic .       CAD (coronary artery disease), native coronary artery   Sees [facility] cardiology  Ongoing chest pain deemed due to microvascular dysfunction  Prescribed oxycodone  PRN chest pain, limiting nitrates due to hypotension - however reviewing latest cardiology note, he should also be on imdur  30mg  and amlodipine  2.5mg  daily - will update med list.  They will let me know when due for opiate refill.  Relevant Medications   amLODipine  (NORVASC ) 2.5 MG tablet   isosorbide  mononitrate (IMDUR ) 30 MG 24 hr tablet   Bariatric surgery status   Liver cirrhosis secondary to NASH (HCC)   Keep GI appt next month. Continue lactulose , torsemide , spironolactone .      MDD (major depressive disorder), recurrent episode, moderate (HCC)    Worsening depression/anxiety in setting of illnesses.  On celexa  20mg  for last several months - will change to lexapro 10mg  daily with option to titrate if needed. Reassess at f/u visit.  Avoid SNRIs in liver disease.       Relevant Medications   escitalopram (LEXAPRO) 10 MG tablet   Type 2 diabetes mellitus with other specified complication (HCC)   Chronic, continue farxiga .  New glucometer sent to pharmacy. Reassess sugar control at 2wk f/u visit, come with glucose monitor Will remain off GLP1RA after recent pancreatitis.  Metformin  intolerance. Avoid pioglitazone  in CHF hx.  Rec closely monitoring sugars with low threshold to start insulin  if needed.  Diabetes complicated by severe obesity, liver cirrhosis, chronic chest pain, CAD, and CHF.       Chronic chest pain - Primary   See above - sees [facility] cardiology  Ongoing chest pain deemed due to microvascular dysfunction  Prescribed oxycodone  PRN chest pain, limiting nitrates due to hypotension - however reviewing latest cardiology note, he should also be on imdur  30mg  and amlodipine  2.5mg  daily - will update med list.  They will let me know when due for opiate refill.       Relevant Medications   escitalopram (LEXAPRO) 10 MG tablet   Portal hypertension (HCC)   Relevant Medications   amLODipine  (NORVASC ) 2.5 MG tablet   isosorbide  mononitrate (IMDUR ) 30 MG 24 hr tablet   (HFpEF) heart failure with preserved ejection fraction (HCC)   Relevant Medications   amLODipine  (NORVASC ) 2.5 MG tablet   isosorbide  mononitrate (IMDUR ) 30 MG 24 hr tablet   Hepatopulmonary syndrome (HCC)   Pending GI f/u next month. No orthodeoxia on latest ambulatory pulse ox.       Relevant Medications   amLODipine  (NORVASC ) 2.5 MG tablet   isosorbide  mononitrate (IMDUR ) 30 MG 24 hr tablet   Acute pancreatitis   Hospitalized with this earlier in the month, now off Ozempic  - will remain off GLP1RA in new h/o pancreatitis       Relevant Medications    sucralfate  (CARAFATE ) 1 GM/10ML suspension     Meds ordered this encounter  Medications   sucralfate  (CARAFATE ) 1 GM/10ML suspension    Sig: Take 10 mLs (1 g total) by mouth 4 (four) times daily -  with meals and at bedtime.    Dispense:  420 mL    Refill:  0   Blood Glucose Monitoring Suppl DEVI    Sig: 1 each by Does not apply route as directed. Dispense based on patient and insurance preference. Use up to four times daily as directed. (FOR ICD-10 E10.9, E11.9).    Dispense:  1 each    Refill:  0   Glucose Blood (BLOOD GLUCOSE TEST STRIPS) STRP    Sig: 1 each by Does not apply route as directed. Dispense based on patient and insurance preference. Use up to four times daily as directed. (FOR ICD-10 E10.9, E11.9).    Dispense:  100 strip    Refill:  0   Lancet Device MISC    Sig: 1 each by Does not apply route as directed. Dispense based on patient and insurance preference. Use  up to four times daily as directed. (FOR ICD-10 E10.9, E11.9).    Dispense:  1 each    Refill:  0   Lancets MISC    Sig: 1 each by Does not apply route as directed. Dispense based on patient and insurance preference. Use up to four times daily as directed. (FOR ICD-10 E10.9, E11.9).    Dispense:  100 each    Refill:  0   escitalopram (LEXAPRO) 10 MG tablet    Sig: Take 1 tablet (10 mg total) by mouth daily.    Dispense:  30 tablet    Refill:  6    To replace celexa     No orders of the defined types were placed in this encounter.   Patient Instructions  Medicine list updated. Try carafate  liquid sent to pharmacy, continue pantoprazole  40mg  twice daily.  Ok to continue [employer] controller inhaler 1 puff daily in the morning.  Keep cardiology appointment, keep GI appt with Dr Albertus next month.  Stay off ozempic  - I have sent a prescription for glucometer to our pharmacy. Record daily readings over the next week and bring us  readings to next visit.  Keep follow up appointment in 2 weeks.  Change celexa  to  lexapro 10mg  daily.   Follow up plan: Return if symptoms worsen or fail to improve.  Anton Blas, MD

## 2023-12-19 NOTE — Patient Instructions (Addendum)
 Medicine list updated. Try carafate  liquid sent to pharmacy, continue pantoprazole  40mg  twice daily.  Ok to continue [employer] controller inhaler 1 puff daily in the morning.  Keep cardiology appointment, keep GI appt with Dr Albertus next month.  Stay off ozempic  - I have sent a prescription for glucometer to our pharmacy. Record daily readings over the next week and bring us  readings to next visit.  Keep follow up appointment in 2 weeks.  Change celexa  to lexapro 10mg  daily.

## 2023-12-19 NOTE — Telephone Encounter (Signed)
Noted. tHanks.

## 2023-12-20 ENCOUNTER — Other Ambulatory Visit (HOSPITAL_COMMUNITY): Payer: Self-pay

## 2023-12-20 ENCOUNTER — Encounter: Admitting: Family Medicine

## 2023-12-20 DIAGNOSIS — I5032 Chronic diastolic (congestive) heart failure: Secondary | ICD-10-CM | POA: Diagnosis not present

## 2023-12-20 DIAGNOSIS — I10 Essential (primary) hypertension: Secondary | ICD-10-CM | POA: Diagnosis not present

## 2023-12-20 DIAGNOSIS — I2585 Chronic coronary microvascular dysfunction: Secondary | ICD-10-CM | POA: Diagnosis not present

## 2023-12-20 DIAGNOSIS — I25118 Atherosclerotic heart disease of native coronary artery with other forms of angina pectoris: Secondary | ICD-10-CM | POA: Diagnosis not present

## 2023-12-21 ENCOUNTER — Encounter: Payer: Self-pay | Admitting: Family Medicine

## 2023-12-21 NOTE — Assessment & Plan Note (Signed)
 Monitor weight off ozempic .

## 2023-12-21 NOTE — Assessment & Plan Note (Addendum)
 Chronic, continue farxiga .  New glucometer sent to pharmacy. Reassess sugar control at 2wk f/u visit, come with glucose monitor Will remain off GLP1RA after recent pancreatitis.  Metformin  intolerance. Avoid pioglitazone  in CHF hx.  Rec closely monitoring sugars with low threshold to start insulin  if needed.  Diabetes complicated by severe obesity, liver cirrhosis, chronic chest pain, CAD, and CHF.

## 2023-12-21 NOTE — Assessment & Plan Note (Signed)
 Hospitalized with this earlier in the month, now off Ozempic  - will remain off GLP1RA in new h/o pancreatitis

## 2023-12-21 NOTE — Assessment & Plan Note (Signed)
 Keep GI appt next month. Continue lactulose , torsemide , spironolactone .

## 2023-12-21 NOTE — Assessment & Plan Note (Signed)
 See above - sees [facility] cardiology  Ongoing chest pain deemed due to microvascular dysfunction  Prescribed oxycodone  PRN chest pain, limiting nitrates due to hypotension - however reviewing latest cardiology note, he should also be on imdur  30mg  and amlodipine  2.5mg  daily - will update med list.  They will let me know when due for opiate refill.

## 2023-12-21 NOTE — Assessment & Plan Note (Addendum)
 Sees [facility] cardiology  Ongoing chest pain deemed due to microvascular dysfunction  Prescribed oxycodone  PRN chest pain, limiting nitrates due to hypotension - however reviewing latest cardiology note, he should also be on imdur  30mg  and amlodipine  2.5mg  daily - will update med list.  They will let me know when due for opiate refill.

## 2023-12-21 NOTE — Assessment & Plan Note (Signed)
 Pending GI f/u next month. No orthodeoxia on latest ambulatory pulse ox.

## 2023-12-24 DIAGNOSIS — R079 Chest pain, unspecified: Secondary | ICD-10-CM | POA: Diagnosis not present

## 2023-12-26 DIAGNOSIS — R55 Syncope and collapse: Secondary | ICD-10-CM | POA: Diagnosis not present

## 2023-12-28 ENCOUNTER — Other Ambulatory Visit: Payer: Self-pay | Admitting: Family Medicine

## 2023-12-28 NOTE — Telephone Encounter (Unsigned)
 Copied from CRM [id number]. Topic: Clinical - Medication Refill >> Dec 28, 2023  9:10 AM Donna BRAVO wrote: Medication: oxyCODONE  (OXY IR/ROXICODONE ) 5 MG immediate release tablet  Has the patient contacted their pharmacy? Yes Pharmacy needs a prescription from Dr Rilla  This is the patient's preferred pharmacy:   [facility] - [place], [place] - [address] [place] [address] Phone: [phone] Fax: [phone]   Is this the correct pharmacy for this prescription? Yes If no, delete pharmacy and type the correct one.   Has the prescription been filled recently? No  Is the patient out of the medication? Yes  Has the patient been seen for an appointment in the last year OR does the patient have an upcoming appointment? Yes  Can we respond through MyChart? No  Agent: Please be advised that Rx refills may take up to 3 business days. We ask that you follow-up with your pharmacy.

## 2023-12-29 MED ORDER — OXYCODONE HCL 5 MG PO TABS: 5.0000 mg | ORAL_TABLET | Freq: Four times a day (QID) | ORAL | 0 refills | Status: DC | PRN

## 2023-12-29 NOTE — Telephone Encounter (Signed)
 Last refill was filled by Historical provider. Ok to refill?  Name of Medication: oxyCODONE  (OXY IR/ROXICODONE ) 5 MG  Name of Pharmacy: [facility] Last Fill or Written Date and Quantity:  Last Office Visit and Type: 12/19/23 Next Office Visit and Type: 01/02/24 Last Controlled Substance Agreement Date:  Last UDS:

## 2023-12-29 NOTE — Telephone Encounter (Signed)
 ERx

## 2023-12-30 ENCOUNTER — Other Ambulatory Visit (HOSPITAL_COMMUNITY): Payer: Self-pay

## 2024-01-02 ENCOUNTER — Ambulatory Visit (INDEPENDENT_AMBULATORY_CARE_PROVIDER_SITE_OTHER)
Admission: RE | Admit: 2024-01-02 | Discharge: 2024-01-02 | Disposition: A | Discharge status: Home / Self Care | Source: Ambulatory Visit | Attending: Family Medicine | Admitting: Family Medicine

## 2024-01-02 ENCOUNTER — Ambulatory Visit: Admitting: Family Medicine

## 2024-01-02 ENCOUNTER — Encounter: Payer: Self-pay | Admitting: Family Medicine

## 2024-01-02 ENCOUNTER — Other Ambulatory Visit: Payer: Self-pay

## 2024-01-02 VITALS — BP 90/52 | HR 69 | Temp 98.4°F | Ht 69.0 in | Wt 261.2 lb

## 2024-01-02 DIAGNOSIS — M545 Low back pain, unspecified: Secondary | ICD-10-CM | POA: Diagnosis not present

## 2024-01-02 DIAGNOSIS — K7469 Other cirrhosis of liver: Secondary | ICD-10-CM | POA: Diagnosis not present

## 2024-01-02 DIAGNOSIS — E1169 Type 2 diabetes mellitus with other specified complication: Secondary | ICD-10-CM

## 2024-01-02 DIAGNOSIS — K8500 Idiopathic acute pancreatitis without necrosis or infection: Secondary | ICD-10-CM

## 2024-01-02 DIAGNOSIS — I10 Essential (primary) hypertension: Secondary | ICD-10-CM | POA: Diagnosis not present

## 2024-01-02 DIAGNOSIS — Z6838 Body mass index (BMI) 38.0-38.9, adult: Secondary | ICD-10-CM

## 2024-01-02 DIAGNOSIS — Z7984 Long term (current) use of oral hypoglycemic drugs: Secondary | ICD-10-CM

## 2024-01-02 DIAGNOSIS — K7581 Nonalcoholic steatohepatitis (NASH): Secondary | ICD-10-CM | POA: Diagnosis not present

## 2024-01-02 DIAGNOSIS — E113559 Type 2 diabetes mellitus with stable proliferative diabetic retinopathy, unspecified eye: Secondary | ICD-10-CM

## 2024-01-02 DIAGNOSIS — K7681 Hepatopulmonary syndrome: Secondary | ICD-10-CM | POA: Diagnosis not present

## 2024-01-02 DIAGNOSIS — M47816 Spondylosis without myelopathy or radiculopathy, lumbar region: Secondary | ICD-10-CM | POA: Diagnosis not present

## 2024-01-02 LAB — POCT GLYCOSYLATED HEMOGLOBIN (HGB A1C): Hemoglobin A1C: 7.5 % — AB (ref 4.0–5.6)

## 2024-01-02 NOTE — Progress Notes (Addendum)
 Ph: [phone] Fax: [phone]   Patient ID: Chad [name], male    DOB: [birth year], 68 y.o.   MRN: [id number]  This visit was conducted in person.  BP (!) 90/52   Pulse 69   Temp 98.4 F (36.9 C) (Oral)   Ht 5' 9 (1.753 m)   Wt 261 lb 4 oz (118.5 kg)   SpO2 96%   BMI 38.58 kg/m   BP Readings from Last 3 Encounters:  01/09/24 102/60  01/02/24 (!) 90/52  12/19/23 (!) 110/58   CC: follow up visit  Subjective:   HPI: JAKAIDEN FILL is a 68 y.o. male presenting on 01/02/2024 for Medical Management of Chronic Issues (Pt here for 4 mo DM f/u. Pt also states he is having back pain. Pain Scale is a 10/Pt is accompanied by his wife Comer)   See prior note for details. Recent hospitalization for chest pain in known HFpEF, CAD s/p PCI to mLAD (2018) with known microvascular dysfunction,  HTN, HLD, DM, NASH cirrhosis, hypothyroid and GERD. LHC 12/15/2023 - similar in-stent stenosis of LAD stent without significant impact on coronary flow. Cardiology recommended up titration of anti-anginal medication. Metoprolol  succinate 25mg  2 tablets (50mg ) daily was started to replace carvedilol .  Thought chest pains due to microvascular dysfunction - was recommended oxycodone  5mg  QID PRN for when chest pain develops - and if ongoing chest pain despite this, to go to ER. He continues sparing opiate use.   Using CardioMEMS - dry weight ~254lbs.  Saw cardiology in follow up 12/20/2023 - referred for cardiac rehab at [facility], referred to Dr Rollene at [facility] for enhanced external counterpulsation (EECP) therapy  Recent diagnosis of hepatopulmonary syndrome - pending GI f/u with Dr Albertus scheduled for 01/09/2024. Continues lactulose  twice daily leading to 4 soft to liquid BMs/day.  Latest CTA chest/abd r/o dissection at [facility] 10/22 - cirrhotic liver with portal hypertension with splenomegaly, also with severe CAC and mild pulm trunk dilation suggesting PAH.  Ozempic  2mg  weekly stopped  11/2023 due to acute pancreatitis. 10 lb weight gain since then.   He notes he had a fall while being transported off exam table after latest CT scan done last month at [facility] - landed on lower back. Midline mid lumbar spine pain since then. No prior h/o lower back pain. Did have some R leg pain initially but not recently. No numbness/weakness of legs, bowel/bladder incontinence.   BP at home running low as well - 90/50s.  10 lb weight gain noted over the past 2 weeks.   DM - does regularly check sugars 160-210s. Compliant with antihyperglycemic regimen which includes: farxiga  10mg  daily. Denies low sugars or hypoglycemic symptoms. Denies paresthesias, blurry vision. Last diabetic eye exam DUE - planning to schedule 2026. Glucometer brand: accucheck . Last foot exam: 01/2023.  Lab Results  Component Value Date   HGBA1C 7.5 (A) 01/02/2024   Diabetic Foot Exam - Simple   No data filed    Lab Results  Component Value Date   MICROALBUR <0.7 08/08/2023        Relevant past medical, surgical, family and social history reviewed and updated as indicated. Interim medical history since our last visit reviewed. Allergies and medications reviewed and updated. Outpatient Medications Prior to Visit  Medication Sig Dispense Refill   albuterol  (VENTOLIN  HFA) 108 (90 Base) MCG/ACT inhaler Inhale 2 puffs into the lungs every 6 (six) hours as needed for wheezing or shortness of breath.     allopurinol  (ZYLOPRIM )  100 MG tablet Take 1 tablet (100 mg total) by mouth every other day. 45 tablet 3   amLODipine  (NORVASC ) 2.5 MG tablet Take 1 tablet (2.5 mg total) by mouth daily.     aspirin  EC 81 MG tablet Take 81 mg by mouth daily.     atorvastatin  (LIPITOR) 20 MG tablet Take 1 tablet (20 mg total) by mouth daily. 90 tablet 3   Blood Glucose Monitoring Suppl DEVI 1 each by Does not apply route as directed. Dispense based on patient and insurance preference. Use up to four times daily as directed. (FOR  ICD-10 E10.9, E11.9). 1 each 0   bumetanide  (BUMEX ) 1 MG tablet Take 2 tablets (2 mg total) by mouth every morning AND 1 tablet (1 mg total) daily in the afternoon.     colchicine  0.6 MG tablet Take 1 tablet (0.6 mg total) by mouth daily as needed (gout flare). 30 tablet 3   dapagliflozin  propanediol (FARXIGA ) 10 MG TABS tablet Take 10 mg by mouth daily before breakfast.     escitalopram (LEXAPRO) 10 MG tablet Take 1 tablet (10 mg total) by mouth daily. 30 tablet 6   Ferrous Sulfate  27 MG TABS Take 1 tablet by mouth daily with breakfast.     fluticasone  (FLONASE ) 50 MCG/ACT nasal spray Place 2 sprays into both nostrils daily. 16 g 12   fluticasone  furoate-vilanterol (BREO ELLIPTA) 200-25 MCG/ACT AEPB Inhale 1 puff into the lungs daily. 60 each 0   Glucose Blood (BLOOD GLUCOSE TEST STRIPS) STRP 1 each by Does not apply route as directed. Dispense based on patient and insurance preference. Use up to four times daily as directed. (FOR ICD-10 E10.9, E11.9). 100 strip 0   isosorbide  mononitrate (IMDUR ) 30 MG 24 hr tablet Take 1 tablet (30 mg total) by mouth daily.     lactulose  (CHRONULAC ) 10 GM/15ML solution TAKE 30 MLS (20 G) BY MOUTH TWO TIMES DAILY. 946 mL 1   Lancet Device MISC 1 each by Does not apply route as directed. Dispense based on patient and insurance preference. Use up to four times daily as directed. (FOR ICD-10 E10.9, E11.9). 1 each 0   Lancets Misc. (ACCU-CHEK SOFTCLIX LANCET DEV) KIT Use up to four times daily as directed to check blood sugar. 1 kit 0   Lancets MISC 1 each by Does not apply route as directed. Dispense based on patient and insurance preference. Use up to four times daily as directed. (FOR ICD-10 E10.9, E11.9). 100 each 0   levothyroxine  (SYNTHROID ) 137 MCG tablet Take 1 tablet (137 mcg total) by mouth daily before breakfast. TAKE ONE TAB BY MOUTH ONCE DAILY. TAKE ON AN EMPTY STOMACH WITH A GLASS OF WATER ATLEAST 30-60 MINUTES BEFORE BREAKFAST 90 tablet 3   magnesium  oxide  (MAG-OX) 400 (240 Mg) MG tablet Take 400 mg by mouth.     metoCLOPramide  (REGLAN ) 5 MG tablet TAKE ONE TABLET (5 MG TOTAL) BY MOUTH EVERY EIGHT HOURS AS NEEDED FOR NAUSEA. 90 tablet 1   metoprolol  succinate (TOPROL -XL) 25 MG 24 hr tablet Take 50 mg by mouth daily.     Multiple Vitamins-Minerals (ONE-A-DAY MENS 50+) TABS Take 1 tablet by mouth in the morning.     nitroGLYCERIN  (NITROSTAT ) 0.4 MG SL tablet Place 1 tablet (0.4 mg total) under the tongue every 5 (five) minutes as needed for chest pain. 60 tablet 3   oxyCODONE  (OXY IR/ROXICODONE ) 5 MG immediate release tablet Take 1 tablet (5 mg total) by mouth every 6 (six) hours  as needed for severe pain (pain score 7-10). as needed for up to 5 days. 30 tablet 0   potassium chloride  SA (KLOR-CON  M) 20 MEQ tablet Take 1 tablet (20 mEq total) by mouth daily. With bumex      Psyllium (METAMUCIL 4 IN 1 FIBER) 55.6 % POWD Take 1 Dose by mouth in the morning.     spironolactone  (ALDACTONE ) 25 MG tablet Take 25 mg by mouth daily.     sucralfate  (CARAFATE ) 1 GM/10ML suspension Take 10 mLs (1 g total) by mouth 4 (four) times daily -  with meals and at bedtime. 420 mL 0   vitamin B-12 (CYANOCOBALAMIN ) 500 MCG tablet Take 1 tablet (500 mcg total) by mouth daily.     vitamin E  400 UNIT capsule Take 400 Units by mouth in the morning.     bumetanide  (BUMEX ) 1 MG tablet Take 2 tablets (2 mg total) by mouth daily.     pantoprazole  (PROTONIX ) 40 MG tablet Take 1 tablet (40 mg total) by mouth 2 (two) times daily. 60 tablet 0   rifaximin  (XIFAXAN ) 550 MG TABS tablet TAKE ONE TABLET (550 MG TOTAL) BY MOUTH TWO TIMES DAILY. 60 tablet 0   No facility-administered medications prior to visit.     Per HPI unless specifically indicated in ROS section below Review of Systems  Objective:  BP (!) 90/52   Pulse 69   Temp 98.4 F (36.9 C) (Oral)   Ht 5' 9 (1.753 m)   Wt 261 lb 4 oz (118.5 kg)   SpO2 96%   BMI 38.58 kg/m   Wt Readings from Last 3 Encounters:  01/09/24  255 lb (115.7 kg)  01/02/24 261 lb 4 oz (118.5 kg)  12/19/23 251 lb 6 oz (114 kg)      Physical Exam Vitals and nursing note reviewed.  Constitutional:      Appearance: Normal appearance. He is not ill-appearing.  HENT:     Mouth/Throat:     Mouth: Mucous membranes are moist.     Pharynx: Oropharynx is clear. No oropharyngeal exudate or posterior oropharyngeal erythema.     Comments: S/p UPPP Eyes:     Extraocular Movements: Extraocular movements intact.     Pupils: Pupils are equal, round, and reactive to light.  Cardiovascular:     Rate and Rhythm: Normal rate and regular rhythm.     Pulses: Normal pulses.     Heart sounds: Normal heart sounds. No murmur heard. Pulmonary:     Effort: Pulmonary effort is normal. No respiratory distress.     Breath sounds: Normal breath sounds. No wheezing, rhonchi or rales.  Abdominal:     General: Bowel sounds are normal.  Musculoskeletal:        General: Tenderness present.     Comments:  Midline mid lumbar spine pain to palpation  + seated SLR on right   Neurological:     Mental Status: He is alert.  Psychiatric:        Mood and Affect: Mood normal.        Behavior: Behavior normal.       Results for orders placed or performed in visit on 01/02/24  POCT glycosylated hemoglobin (Hb A1C)   Collection Time: 01/02/24 12:52 PM  Result Value Ref Range   Hemoglobin A1C 7.5 (A) 4.0 - 5.6 %   HbA1c POC (<> result, manual entry)     HbA1c, POC (prediabetic range)     HbA1c, POC (controlled diabetic range)     *  Note: Due to a large number of results and/or encounters for the requested time period, some results have not been displayed. A complete set of results can be found in Results Review.   Lab Results  Component Value Date   NA 137 12/11/2023   CL 102 12/11/2023   K 3.7 12/11/2023   CO2 25 12/11/2023   BUN 12 12/11/2023   CREATININE 0.78 12/11/2023   GFRNONAA >60 12/11/2023   CALCIUM  8.0 (L) 12/11/2023   PHOS 2.9 12/09/2023    ALBUMIN 3.2 (L) 12/11/2023   GLUCOSE 124 (H) 12/11/2023    Lab Results  Component Value Date   TSH 4.574 (H) 11/15/2023    Lab Results  Component Value Date   WBC 3.8 (L) 12/11/2023   HGB 13.7 12/11/2023   HCT 42.7 12/11/2023   MCV 92.0 12/11/2023   PLT 102 (L) 12/11/2023    Lab Results  Component Value Date   LIPASE 62 (H) 12/10/2023    Assessment & Plan:   Problem List Items Addressed This Visit     Diabetes mellitus type 2 with retinopathy (HCC)   Encouraged he schedule diabetic eye exam - planning to do this in 2026      Relevant Medications   glipiZIDE  (GLUCOTROL ) 5 MG tablet   HTN (hypertension)   Now running hypotensive. Current regimen is amlodipine  2.5mg  daily (anti-anginal), budesonide 2mg  3-4 tablets daily, isosorbide  30mg  daily (anti-anginal), Torpol XL 50mg  daily, and spironolactone  25mg  daily.  Recommend drop spironolactone  to 1/2 tab daily if he develops hypotensive symptoms.       Relevant Medications   bumetanide  (BUMEX ) 1 MG tablet   Severe obesity (BMI 35.0-39.9) with comorbidity (HCC)   Reviewed weight gain noted since stopping ozempic       Relevant Medications   glipiZIDE  (GLUCOTROL ) 5 MG tablet   Liver cirrhosis secondary to NASH [facility])   Pending GI f/u next week       Type 2 diabetes mellitus with other specified complication (HCC)   Chronic, only on farxiga , with weight gain since stopping ozempic  after developing pancreatitis.  Latest A1c increased from 6.6 to 7.5% Metformin  intolerance.  Will start glipizide  5mg  daily with breakfast. Consider insulin  if needed.  Diabetes associated with obesity, cirrhosis with hepatopulmonary syndrome, hypertension, retinopathy, and complicated by recent pancreatitis.       Relevant Medications   glipiZIDE  (GLUCOTROL ) 5 MG tablet   Other Relevant Orders   POCT glycosylated hemoglobin (Hb A1C) (Completed)   Hepatopulmonary syndrome (HCC)   Appreciate GI eval - pending appt next week.  He is now  taking lactulose  TID regularly - notes sugars are higher since taking this. Doubt significant contribution from lactulose  - he will check with GI about this.       Relevant Medications   bumetanide  (BUMEX ) 1 MG tablet   Acute pancreatitis   ?ozempic  related - now off this.       Acute midline low back pain without sciatica - Primary   Acute pain after injury sustained 3 wks ago.  Update lumbar films r/o vertebral fracture, consider advanced imaging to evaluate for lumbar herniated disc given R leg symptoms.       Relevant Orders   DG Lumbar Spine Complete (Completed)     Meds ordered this encounter  Medications   glipiZIDE  (GLUCOTROL ) 5 MG tablet    Sig: Take 1 tablet (5 mg total) by mouth daily before breakfast.    Dispense:  90 tablet    Refill:  1  New commencement to replace ozempic     Orders Placed This Encounter  Procedures   DG Lumbar Spine Complete    Standing Status:   Future    Number of Occurrences:   1    Expiration Date:   01/01/2025    Reason for Exam (SYMPTOM  OR DIAGNOSIS REQUIRED):   midline mid lumbar back pain after fall 3 wks ago    Preferred imaging location?:   [facility]   POCT glycosylated hemoglobin (Hb A1C)    Patient Instructions  Low back xray today  Schedule diabetic eye exam.  If low blood pressure symptoms develop (dizzy, lightheaded with standing) or blood pressures drop <90/50, drop spironolactone  to 1/2 tablet daily.  Keep GI appointment next week.  Return to see me in 3 months for diabetes follow up visit   Follow up plan: Return in about 3 months (around 04/03/2024), or if symptoms worsen or fail to improve, for follow up visit.  Anton Blas, MD

## 2024-01-02 NOTE — Patient Instructions (Addendum)
 Low back xray today  Schedule diabetic eye exam.  If low blood pressure symptoms develop (dizzy, lightheaded with standing) or blood pressures drop <90/50, drop spironolactone  to 1/2 tablet daily.  Keep GI appointment next week.  Return to see me in 3 months for diabetes follow up visit

## 2024-01-03 ENCOUNTER — Other Ambulatory Visit: Payer: Self-pay

## 2024-01-04 ENCOUNTER — Other Ambulatory Visit: Payer: Self-pay

## 2024-01-04 ENCOUNTER — Ambulatory Visit: Payer: Self-pay | Admitting: Family Medicine

## 2024-01-04 ENCOUNTER — Encounter: Payer: Self-pay | Admitting: Family Medicine

## 2024-01-04 DIAGNOSIS — M545 Low back pain, unspecified: Secondary | ICD-10-CM | POA: Insufficient documentation

## 2024-01-04 MED ORDER — GLIPIZIDE 5 MG PO TABS: 5.0000 mg | ORAL_TABLET | Freq: Every day | ORAL | 1 refills | Status: AC

## 2024-01-04 NOTE — Assessment & Plan Note (Signed)
 Acute pain after injury sustained 3 wks ago.  Update lumbar films r/o vertebral fracture, consider advanced imaging to evaluate for lumbar herniated disc given R leg symptoms.

## 2024-01-04 NOTE — Assessment & Plan Note (Signed)
 Pending GI f/u next week

## 2024-01-04 NOTE — Assessment & Plan Note (Addendum)
 Chronic, only on farxiga , with weight gain since stopping ozempic  after developing pancreatitis.  Latest A1c increased from 6.6 to 7.5% Metformin  intolerance.  Will start glipizide  5mg  daily with breakfast. Consider insulin  if needed.  Diabetes associated with obesity, cirrhosis with hepatopulmonary syndrome, hypertension, retinopathy, and complicated by recent pancreatitis.

## 2024-01-04 NOTE — Assessment & Plan Note (Signed)
 Reviewed weight gain noted since stopping ozempic 

## 2024-01-04 NOTE — Assessment & Plan Note (Signed)
 Now running hypotensive. Current regimen is amlodipine  2.5mg  daily (anti-anginal), budesonide 2mg  3-4 tablets daily, isosorbide  30mg  daily (anti-anginal), Torpol XL 50mg  daily, and spironolactone  25mg  daily.  Recommend drop spironolactone  to 1/2 tab daily if he develops hypotensive symptoms.

## 2024-01-04 NOTE — Assessment & Plan Note (Signed)
 Appreciate GI eval - pending appt next week.  He is now taking lactulose  TID regularly - notes sugars are higher since taking this. Doubt significant contribution from lactulose  - he will check with GI about this.

## 2024-01-04 NOTE — Assessment & Plan Note (Signed)
 Encouraged he schedule diabetic eye exam - planning to do this in 2026

## 2024-01-04 NOTE — Assessment & Plan Note (Signed)
?  ozempic  related - now off this.

## 2024-01-06 ENCOUNTER — Other Ambulatory Visit: Payer: Self-pay

## 2024-01-06 ENCOUNTER — Other Ambulatory Visit: Payer: Self-pay | Admitting: Family Medicine

## 2024-01-07 ENCOUNTER — Other Ambulatory Visit: Payer: Self-pay

## 2024-01-08 ENCOUNTER — Other Ambulatory Visit: Payer: Self-pay

## 2024-01-08 MED FILL — Pantoprazole Sodium EC Tab 40 MG (Base Equiv): ORAL | 30 days supply | Qty: 60 | Fill #0 | Status: CN

## 2024-01-08 NOTE — Telephone Encounter (Signed)
 ERx

## 2024-01-09 ENCOUNTER — Encounter: Payer: Self-pay | Admitting: Internal Medicine

## 2024-01-09 ENCOUNTER — Other Ambulatory Visit: Payer: Self-pay

## 2024-01-09 ENCOUNTER — Ambulatory Visit (INDEPENDENT_AMBULATORY_CARE_PROVIDER_SITE_OTHER): Admitting: Internal Medicine

## 2024-01-09 ENCOUNTER — Other Ambulatory Visit (HOSPITAL_COMMUNITY): Payer: Self-pay

## 2024-01-09 VITALS — BP 102/60 | HR 64 | Ht 69.0 in | Wt 255.0 lb

## 2024-01-09 DIAGNOSIS — E1143 Type 2 diabetes mellitus with diabetic autonomic (poly)neuropathy: Secondary | ICD-10-CM | POA: Diagnosis not present

## 2024-01-09 DIAGNOSIS — K219 Gastro-esophageal reflux disease without esophagitis: Secondary | ICD-10-CM | POA: Diagnosis not present

## 2024-01-09 DIAGNOSIS — K7581 Nonalcoholic steatohepatitis (NASH): Secondary | ICD-10-CM

## 2024-01-09 DIAGNOSIS — K3184 Gastroparesis: Secondary | ICD-10-CM

## 2024-01-09 DIAGNOSIS — K7469 Other cirrhosis of liver: Secondary | ICD-10-CM

## 2024-01-09 DIAGNOSIS — K766 Portal hypertension: Secondary | ICD-10-CM | POA: Diagnosis not present

## 2024-01-09 DIAGNOSIS — K7681 Hepatopulmonary syndrome: Secondary | ICD-10-CM

## 2024-01-09 MED ORDER — FLUCONAZOLE 100 MG PO TABS
ORAL_TABLET | ORAL | 0 refills | Status: DC
  Filled 2024-01-09: qty 44, 20d supply, fill #0

## 2024-01-09 NOTE — Patient Instructions (Addendum)
 We have sent the following medications to your pharmacy for you to pick up at your convenience: fluconazole  taking 4 tablets by mouth once, then the next day taking 2 tablets daily x 20 days.    Continue pantoprazole  twice daily.   Continue lactulose  and metoclopramide  as needed.  We have referred you to pulmonary. They will contact you with an appointment.   Please follow up with Ellouise Console, PA on 02/06/24 at 1:30 pm.   _______________________________________________________  If your blood pressure at your visit was 140/90 or greater, please contact your primary care physician to follow up on this.  _______________________________________________________  If you are age 68 or older, your body mass index should be between 23-30. Your Body mass index is 37.66 kg/m. If this is out of the aforementioned range listed, please consider follow up with your Primary Care Provider.  If you are age 22 or younger, your body mass index should be between 19-25. Your Body mass index is 37.66 kg/m. If this is out of the aformentioned range listed, please consider follow up with your Primary Care Provider.   ________________________________________________________  The [facility] providers would like to encourage you to use MYCHART to communicate with providers for non-urgent requests or questions.  Due to long hold times on the telephone, sending your provider a message by [facility] may be a faster and more efficient way to get a response.  Please allow 48 business hours for a response.  Please remember that this is for non-urgent requests.  _______________________________________________________  [facility] is using a team-based approach to care.  Your team is made up of your doctor and two to three APPS. Our APPS (Nurse Practitioners and Physician Assistants) work with your physician to ensure care continuity for you. They are fully qualified to address your health concerns and develop a  treatment plan. They communicate directly with your gastroenterologist to care for you. Seeing the Advanced Practice Practitioners on your physician's team can help you by facilitating care more promptly, often allowing for earlier appointments, access to diagnostic testing, procedures, and other specialty referrals.

## 2024-01-10 ENCOUNTER — Encounter: Payer: Self-pay | Admitting: Internal Medicine

## 2024-01-10 NOTE — Progress Notes (Signed)
 Subjective:    Patient ID: Chad [name], male    DOB: Jul 28, 1955, 68 y.o.   MRN: [id number]  HPI Chad [name] is a 68 year old male with cirrhosis, coronary artery disease, and diabetic gastroparesis, history of colon polyps and gout who presents for follow-up after recent hospitalization for weakness, shortness of breath, and chest pain.  He is here with his wife.  Odynophagia and dysphagia - Significant odynophagia described as 'swallowing glass'. - Difficulty swallowing even water, which sometimes 'clogs' and 'chokes' him. - Switched from Kool-Aid to water due to swallowing difficulties. - Mouth is very dry per caregiver. - Eating very little due to pain, with only a bowl of soup consumed the previous day. - Difficulty swallowing rifaximin  tablets due to his size, resulting in discontinuation of the medication.  Nutritional status and weight loss - Eating very little secondary to odynophagia. - Weight loss noted, raising concerns about nutritional intake.  Dyspnea and respiratory symptoms - Persistent dyspnea despite normal oxygen saturation. - Uses Breo once daily and albuterol  as needed. - No home oxygen prescribed.  Ascites and peripheral edema - Swelling in hands and abdomen, constant throughout the day. - Takes Bumex , two tablets every morning and one in the afternoon, and spironolactone  for fluid management.  Medication tolerance and adherence - Difficulty swallowing rifaximin  tablets, leading to discontinuation. - Continues pantoprazole  and metoclopramide  as needed. - Continues lactulose , which affects blood glucose levels.  Recent hospitalization and imaging findings - Hospitalized in late September for weakness, shortness of breath, and chest pain. - CT chest, abdomen, and pelvis showed cirrhosis without hepatic neoplasm, stable splenomegaly with portal venous hypertension, colonic diverticulosis, mildly enlarged prostate, and a small fat-containing  inguinal hernia. - Echocardiogram suggested shunt by positive bubble study   Review of Systems As per HPI, otherwise negative  Current Medications, Allergies, Past Medical History, Past Surgical History, Family History and Social History were reviewed in [employer] record.    Objective:   Physical Exam BP 102/60   Pulse 64   Ht 5' 9 (1.753 m)   Wt 255 lb (115.7 kg)   BMI 37.66 kg/m  Gen: awake, alert, NAD HEENT: anicteric  CV: RRR, no mrg Pulm: CTA b/l Abd: soft, NT/ND, +BS throughout Ext: no c/c/e Neuro: nonfocal, no asterixis  RADIOLOGY CT chest, abdomen, and pelvis: Cirrhosis without hepatic neoplasm, stable splenomegaly with portal venous hypertension, colonic diverticulosis, mildly enlarged prostate, small fat-containing inguinal hernia (12/10/2023)  DIAGNOSTIC Procedure: 2D Echo and Saline Contrast Bubble Study   Indications:    Eval for shunting which might suggest hepatopulmonary  syndrome    History:       Patient has prior history of Echocardiogram examinations.  CHF,                 CAD; Risk Factors:Hypertension.    Sonographer:    Charmaine Gaskins  Referring Phys: [address] Jarrells M Breeanne Oblinger   IMPRESSIONS     1. Left ventricular ejection fraction, by estimation, is 55 to 60%. The  left ventricle has normal function.   2. Right ventricular systolic function is mildly reduced. The right  ventricular size is normal.   3. The bubble study was positive late, on the 10th beat after bubbles  appeared on the right. This suggests possible pulmonary AVMs.   4. Limited echo for bubble study.        Assessment & Plan:   Candidal esophagitis Suspected due to inhaled steroid use. Severe  odynophagia and sensation of swallowing glass noted. Immediate treatment necessary. - Prescribed fluconazole  400 mg day one, then 200 mg daily for 20 days. - Advised rinsing mouth and spitting after using Breo. - Contact clinic if no improvement by  Friday.  Cirrhosis of liver with portal hypertension and ascites Cirrhosis with portal hypertension confirmed. Ascites managed with diuretics, currently Bumex  and Aldactone .. Swallowing difficulty may relate to esophageal varices or infection. Lactulose  continued; rifaximin  difficult to swallow due to tablet size. - Continue lactulose . - Consider rifaximin  if swallowing improves.  He reports primary care had concerns of hyperglycemia from the lactulose . - On beta-blocker for esophageal varices - HCC screening up-to-date by recent CT scan, ultrasound every 6 months or cross-sectional imaging with contrast annually  Hepatopulmonary syndrome Contributing to dyspnea and intermittent hypoxia. Blood shunting affects oxygenation.  Positive bubble study and elevated AA gradient while he was hospitalized recently.  Pulmonology referral necessary.  Liver transplantation is ultimately the treatment for hepatopulmonary syndrome but at this point we will get pulmonary opinion to exclude other causes of dyspnea. - Referred to pulmonology. - Consider home oxygen therapy if recommended.  Type 2 diabetes mellitus with diabetic gastroparesis and GERD Diabetes with gastroparesis managed with pantoprazole  and metoclopramide . Dysphagia may complicate diabetes management. Lactulose  may affect glucose levels. - Continue current doses pantoprazole  and as needed metoclopramide .  History of adenomatous polyps -Recall colonoscopy December 2026  40 minutes total spent today including patient facing time, coordination of care, reviewing medical history/procedures/pertinent radiology studies, and documentation of the encounter.

## 2024-01-11 ENCOUNTER — Other Ambulatory Visit: Payer: Self-pay

## 2024-01-17 ENCOUNTER — Other Ambulatory Visit: Payer: Self-pay | Admitting: Family Medicine

## 2024-01-17 NOTE — Telephone Encounter (Signed)
 Name of Medication:  Oxycodone -APAP Name of Pharmacy:  [facility] Last Fill or Written Date and Quantity:  12/30/23, #30 Last Office Visit and Type:  01/02/24, 4 mo DM f/u Next Office Visit and Type:  04/03/24, 3 mo DM f/u Last Controlled Substance Agreement Date:  04/03/15 Last UDS:  04/03/15

## 2024-01-22 NOTE — Telephone Encounter (Signed)
 ERx

## 2024-01-23 ENCOUNTER — Other Ambulatory Visit: Payer: Self-pay | Admitting: Family Medicine

## 2024-01-29 ENCOUNTER — Other Ambulatory Visit: Payer: Self-pay | Admitting: Family Medicine

## 2024-01-30 ENCOUNTER — Other Ambulatory Visit: Payer: Self-pay | Admitting: Family Medicine

## 2024-01-30 MED ORDER — LACTULOSE 10 GM/15ML PO SOLN: 30.0000 g | Freq: Two times a day (BID) | ORAL | 3 refills | Status: DC

## 2024-01-30 NOTE — Telephone Encounter (Signed)
 Copied from CRM [id number]. Topic: Clinical - Medication Refill >> Jan 30, 2024 11:07 AM Rea C wrote: Medication: lactulose  (CHRONULAC ) 10 GM/15ML solution  Has the patient contacted their pharmacy? Pharmacy told patient to call clinic. Patient is going to be out tonight and it helps with his liver. Patient would like it to be called in today so he can get it due to the pharmacy not wanting to fill it.   (Agent: If no, request that the patient contact the pharmacy for the refill. If patient does not wish to contact the pharmacy document the reason why and proceed with request.) (Agent: If yes, when and what did the pharmacy advise?)  This is the patient's preferred pharmacy:  [facility] - [place], [place] - [address] [place] [address] Phone: [phone] Fax: [phone]  Is this the correct pharmacy for this prescription? Yes If no, delete pharmacy and type the correct one.   Has the prescription been filled recently? Yes  Is the patient out of the medication? Yes, patient will be out today.   Has the patient been seen for an appointment in the last year OR does the patient have an upcoming appointment? Yes  Can we respond through MyChart? Yes  Agent: Please be advised that Rx refills may take up to 3 business days. We ask that you follow-up with your pharmacy.

## 2024-01-30 NOTE — Telephone Encounter (Signed)
 ERx

## 2024-02-06 ENCOUNTER — Encounter: Payer: Self-pay | Admitting: Pulmonary Disease

## 2024-02-06 ENCOUNTER — Ambulatory Visit: Admitting: Pulmonary Disease

## 2024-02-06 VITALS — BP 124/69 | HR 62 | Ht 69.0 in | Wt 259.0 lb

## 2024-02-06 DIAGNOSIS — R931 Abnormal findings on diagnostic imaging of heart and coronary circulation: Secondary | ICD-10-CM

## 2024-02-06 DIAGNOSIS — R55 Syncope and collapse: Secondary | ICD-10-CM | POA: Diagnosis not present

## 2024-02-06 DIAGNOSIS — K7681 Hepatopulmonary syndrome: Secondary | ICD-10-CM

## 2024-02-06 DIAGNOSIS — R0609 Other forms of dyspnea: Secondary | ICD-10-CM

## 2024-02-06 DIAGNOSIS — I519 Heart disease, unspecified: Secondary | ICD-10-CM | POA: Diagnosis not present

## 2024-02-06 NOTE — Progress Notes (Signed)
 @Patient  ID: Chad [name], male    DOB: 09-15-1955, 68 y.o.   MRN: [id number]  Chief Complaint  Patient presents with   Consult    Pt reports o2 levels have been fine but heart isn't getting enough oxygen per PCP, pt reports being sleepy and tired (getting worser)    Referring provider: Albertus Gordy HERO, MD  HPI:   68 y.o. man with history of diastolic dysfunction, CHF as well as cirrhosis whom we are seeing for evaluation of dyspnea on exertion.  Most recent GI note reviewed.  Most recent PCP note reviewed.  Most recent cardiology note reviewed.  Patient is a short of breath now for several years.  Least 5 years.  Gradually has worsened.  Certainly worse with exertion.  Can occur at rest as well.  Almost episodic.  Comes and goes.  Associated symptoms increased lightheadedness, seeing black, presyncope and syncope.  No time of day when things are better or worse.  No position to make things better or worse.  No seasonal or environmental factors he can identify that make things better or worse.  He notes his oxygen saturation is always in the high 90s.  At home when he is walking around or moving as well as in doctors offices.  He states has been hospitalized in the past and placed on oxygen albeit temporarily.  He has a history of diastolic dysfunction, diastolic heart failure.  Difficult control volume status with CardioMEMS now in place.  Remote echocardiogram several reviewed.  Demonstrated right atrial dilation.  More recent echocardiogram from 2025, x 2, demonstrate normal left atrial size, normal EF, normal valves, diastolic dysfunction, normal RA and RV size but now with RV dysfunction.  Estimated PASP/RVSP all reported as normal.  Echo with bubble study recently demonstrated concern for intrapulmonary shunt with late bubbles present.  In the past he had a stent placed in the coronary artery.  Recent left heart catheterization in 2025 showed 40% in stenosis but no other concerning  findings.  Minimal coronary disease.  He had a right heart catheterization in the past, date not clear to me.  But did not demonstrate pulmonary hypertension with a mean PA pressure in the teens.  Most recent cross-sectional imaging CT angio of the chest 10/2023 demonstrates clear lungs bilaterally without significant parenchymal findings.  Questionaires / Pulmonary Flowsheets:   ACT:      No data to display          MMRC:     No data to display          Epworth:      No data to display          Tests:   FENO:  No results found for: NITRICOXIDE  PFT:     No data to display          WALK:     01/15/2019    3:13 PM 05/15/2017    3:22 PM  SIX MIN WALK  Supplimental Oxygen during Test? (L/min) No   2 Minute Oxygen Saturation %  95 %  2 Minute HR  --  4 Minute Oxygen Saturation %  97 %  6 Minute Oxygen Saturation %  98 %  6 Minute HR  77  Tech Comments: c/o dizziness and sob.     Imaging: Personally reviewed meds per EMR and discussion in this note No results found.  Lab Results: Personally reviewed and as per EMR and discussion in this note CBC  Component Value Date/Time   WBC 3.8 (L) 12/11/2023 0344   RBC 4.64 12/11/2023 0344   HGB 13.7 12/11/2023 0344   HCT 42.7 12/11/2023 0344   PLT 102 (L) 12/11/2023 0344   MCV 92.0 12/11/2023 0344   MCV 88.0 08/04/2010 0000   MCH 29.5 12/11/2023 0344   MCHC 32.1 12/11/2023 0344   RDW 13.2 12/11/2023 0344   LYMPHSABS 0.6 (L) 11/30/2023 0949   MONOABS 0.5 11/30/2023 0949   EOSABS 0.1 11/30/2023 0949   BASOSABS 0.0 11/30/2023 0949    BMET    Component Value Date/Time   NA 137 12/11/2023 0344   NA 139 08/04/2010 0000   K 3.7 12/11/2023 0344   K 4.4 08/04/2010 0000   CL 102 12/11/2023 0344   CO2 25 12/11/2023 0344   GLUCOSE 124 (H) 12/11/2023 0344   BUN 12 12/11/2023 0344   CREATININE 0.78 12/11/2023 0344   CREATININE 1.11 10/26/2018 0909   CALCIUM  8.0 (L) 12/11/2023 0344   GFRNONAA >60  12/11/2023 0344   GFRAA >60 05/17/2018 1530    BNP    Component Value Date/Time   BNP 24.9 12/09/2023 1547    ProBNP    Component Value Date/Time   PROBNP 18.0 04/06/2018 1152    Specialty Problems       Pulmonary Problems   OSA (obstructive sleep apnea)   Has not tolerated CPAP      Cough   Asthma   Hypoxia    Allergies[1]  Immunization History  Administered Date(s) Administered   Fluad Quad(high Dose 65+) 11/25/2020, 12/21/2021   Fluad Trivalent(High Dose 65+) 11/08/2022   H1N1 01/30/2008   Hep A / Hep B 10/07/2015, 11/04/2015, 04/08/2016   INFLUENZA, HIGH DOSE SEASONAL PF 10/24/2023   Influenza Split 12/28/2010, 11/15/2011   Influenza, Seasonal, Injecte, Preservative Fre 11/27/2006, 11/26/2007, 11/25/2008, 11/24/2009   Influenza,inj,Quad PF,6+ Mos 11/15/2012, 11/04/2013, 10/23/2014, 10/27/2015, 11/08/2016, 10/26/2017, 10/15/2018, 11/13/2019   PFIZER(Purple Top)SARS-COV-2 Vaccination 04/04/2019, 04/25/2019, 01/24/2020   Pfizer Covid-19 Vaccine Bivalent Booster 4yrs & up 12/11/2020   Pneumococcal Conjugate-13 03/18/2020   Pneumococcal Polysaccharide-23 11/27/2006, 09/06/2011   Td 01/31/2011   Zoster Recombinant(Shingrix) 12/10/2018, 07/10/2019, 09/10/2019    Past Medical History:  Diagnosis Date   Abnormal drug screen    innaprop negative for hydrocodone  09/2013, inapprop negative for hydrocodone  and tramadol  02/2014; inappropr negative hydrocodone  03/2015   Acute diverticulitis 08/15/2014   Allergy    seasonal allergies   Anxiety    on meds   Arthritis    both hips and knees; got shots in each hip in August (01/25/2013)   Balanoposthitis 01/29/2015   Bone spur    L4 L5   Bulging lumbar disc    Central retinal vein occlusion with macular edema of left eye (HCC) 10/17/2013   L eye 01/2017  Bulakowski - referred to retinologist 01/2017  CRVO with macular edema L eye planned treatment with Ozurdex  (dexamehthasone intravitreal implant) by Dr Jarold 02/2017    Cirrhosis (HCC)    Coronary artery disease    COVID-19 10/29/2019   10/2019 - s/p mAb infusion treatment      COVID-19 virus infection 10/29/2019   10/2019 - s/p mAb infusion treatment    Diabetes mellitus without complication (HCC)    type 2- on meds   Diastolic CHF, chronic (HCC) 04/02/2012   Diverticulosis    Gastric bypass status for obesity 1985   Gastritis 08/31/2015   with focal intestinal metaplasia   GERD (gastroesophageal reflux disease)    severe, h/o  gastritis and GI bleed, per pt normal EGD at [facility] 2008   Hepatic steatosis    History of diabetes mellitus 1990s   with mild background retinopathy, resolved with weight loss   HLD (hyperlipidemia)    statin caused leg cramps   HTN (hypertension)    not on meds at this time (02/05/2020)   Hyperglycemia glucose over 300 in last 24 hrs 07/12/2016   Hyperplastic colon polyp 2008   Hypothyroid    on meds   Hypoxia 11/15/2023   Internal hemorrhoids    Morbid obesity (HCC)    Narrowing of lumbar spine    OSA (obstructive sleep apnea)    unable to use CPAP as of last try 2/2 h/o tracheostomy? weight loss 100lbs   Otomycosis of right ear 07/06/2011   Primary localized osteoarthritis of left knee 06/29/2016   PVC (premature ventricular contraction)    RBBB Infer axis   Right ear pain    s/p eval by ENT - thought TMJ referred pain and sent to oral surg for dental splint   Seasonal allergies    Sensorineural hearing loss, bilateral    no longer wears hearing aides   Splenomegaly    Thrombocytopenia 06/10/2015   Platelet count dropped to 73 post op day 2 after total knee    Tinnitus    due to sensorineural hearing loss R>L with ETD   Trifascicular block  RBBB/LPFB/1AVB     Tobacco History: Tobacco Use History[2] Counseling given: Not Answered   Continue to not smoke  Outpatient Encounter Medications as of 02/06/2024  Medication Sig   albuterol  (VENTOLIN  HFA) 108 (90 Base) MCG/ACT inhaler Inhale 2 puffs into the  lungs every 6 (six) hours as needed for wheezing or shortness of breath.   allopurinol  (ZYLOPRIM ) 100 MG tablet Take 1 tablet (100 mg total) by mouth every other day.   amLODipine  (NORVASC ) 2.5 MG tablet Take 1 tablet (2.5 mg total) by mouth daily.   aspirin  EC 81 MG tablet Take 81 mg by mouth daily.   atorvastatin  (LIPITOR) 20 MG tablet Take 1 tablet (20 mg total) by mouth daily.   Blood Glucose Monitoring Suppl DEVI 1 each by Does not apply route as directed. Dispense based on patient and insurance preference. Use up to four times daily as directed. (FOR ICD-10 E10.9, E11.9).   bumetanide  (BUMEX ) 1 MG tablet Take 2 tablets (2 mg total) by mouth every morning AND 1 tablet (1 mg total) daily in the afternoon.   colchicine  0.6 MG tablet Take 1 tablet (0.6 mg total) by mouth daily as needed (gout flare).   dapagliflozin  propanediol (FARXIGA ) 10 MG TABS tablet Take 10 mg by mouth daily before breakfast.   escitalopram  (LEXAPRO ) 10 MG tablet Take 1 tablet (10 mg total) by mouth daily.   Ferrous Sulfate  27 MG TABS Take 1 tablet by mouth daily with breakfast.   fluconazole  (DIFLUCAN ) 100 MG tablet Take 4 tablets (400 mg) by mouth once on day 1 THEN take 2 tablets (200 mg) daily x 20 days   fluticasone  (FLONASE ) 50 MCG/ACT nasal spray Place 2 sprays into both nostrils daily.   fluticasone  furoate-vilanterol (BREO ELLIPTA ) 200-25 MCG/ACT AEPB Inhale 1 puff into the lungs daily.   glipiZIDE  (GLUCOTROL ) 5 MG tablet Take 1 tablet (5 mg total) by mouth daily before breakfast.   Glucose Blood (BLOOD GLUCOSE TEST STRIPS) STRP 1 each by Does not apply route as directed. Dispense based on patient and insurance preference. Use up to four times  daily as directed. (FOR ICD-10 E10.9, E11.9).   isosorbide  mononitrate (IMDUR ) 30 MG 24 hr tablet Take 1 tablet (30 mg total) by mouth daily.   lactulose  (CHRONULAC ) 10 GM/15ML solution Take 45 mLs (30 g total) by mouth 2 (two) times daily.   Lancet Device MISC 1 each by Does  not apply route as directed. Dispense based on patient and insurance preference. Use up to four times daily as directed. (FOR ICD-10 E10.9, E11.9).   Lancets Misc. (ACCU-CHEK SOFTCLIX LANCET DEV) KIT Use up to four times daily as directed to check blood sugar.   Lancets MISC 1 each by Does not apply route as directed. Dispense based on patient and insurance preference. Use up to four times daily as directed. (FOR ICD-10 E10.9, E11.9).   levothyroxine  (SYNTHROID ) 137 MCG tablet Take 1 tablet (137 mcg total) by mouth daily before breakfast. TAKE ONE TAB BY MOUTH ONCE DAILY. TAKE ON AN EMPTY STOMACH WITH A GLASS OF WATER ATLEAST 30-60 MINUTES BEFORE BREAKFAST   magnesium  oxide (MAG-OX) 400 (240 Mg) MG tablet Take 400 mg by mouth.   metoCLOPramide  (REGLAN ) 5 MG tablet TAKE ONE TABLET (5 MG TOTAL) BY MOUTH EVERY EIGHT HOURS AS NEEDED FOR NAUSEA.   metoprolol  succinate (TOPROL -XL) 25 MG 24 hr tablet Take 50 mg by mouth daily.   Multiple Vitamins-Minerals (ONE-A-DAY MENS 50+) TABS Take 1 tablet by mouth in the morning.   nitroGLYCERIN  (NITROSTAT ) 0.4 MG SL tablet Place 1 tablet (0.4 mg total) under the tongue every 5 (five) minutes as needed for chest pain.   oxyCODONE  (OXY IR/ROXICODONE ) 5 MG immediate release tablet Take 1 tablet (5 mg total) by mouth 2 (two) times daily as needed for severe pain (pain score 7-10) or moderate pain (pain score 4-6).   pantoprazole  (PROTONIX ) 40 MG tablet Take 1 tablet (40 mg total) by mouth 2 (two) times daily.   potassium chloride  SA (KLOR-CON  M) 20 MEQ tablet Take 1 tablet (20 mEq total) by mouth daily. With bumex    Psyllium (METAMUCIL 4 IN 1 FIBER) 55.6 % POWD Take 1 Dose by mouth in the morning.   spironolactone  (ALDACTONE ) 25 MG tablet Take 25 mg by mouth daily.   sucralfate  (CARAFATE ) 1 GM/10ML suspension Take 10 mLs (1 g total) by mouth 4 (four) times daily -  with meals and at bedtime.   vitamin B-12 (CYANOCOBALAMIN ) 500 MCG tablet Take 1 tablet (500 mcg total) by  mouth daily.   vitamin E  400 UNIT capsule Take 400 Units by mouth in the morning.   No facility-administered encounter medications on file as of 02/06/2024.     Review of Systems  Review of Systems  No chest pain with exertion.  Orthopnea or PND.  Comprehensive review of systems otherwise negative. Physical Exam  BP 124/69   Pulse 62   Ht 5' 9 (1.753 m)   Wt 259 lb (117.5 kg)   SpO2 98%   BMI 38.25 kg/m   Wt Readings from Last 5 Encounters:  02/06/24 259 lb (117.5 kg)  01/09/24 255 lb (115.7 kg)  01/02/24 261 lb 4 oz (118.5 kg)  12/19/23 251 lb 6 oz (114 kg)  12/10/23 251 lb 1.7 oz (113.9 kg)    BMI Readings from Last 5 Encounters:  02/06/24 38.25 kg/m  01/09/24 37.66 kg/m  01/02/24 38.58 kg/m  12/19/23 37.12 kg/m  12/10/23 37.08 kg/m     Physical Exam General: Sitting in chair, chronically ill-appearing Eyes: EOMI, no icterus noted Neck: Supple, JVP appreciated sitting  upright Pulmonary: Clear, normal rate Cardiovascular regular rate and rhythm, no murmur Abdomen: Nondistended MSK: No synovitis, no joint effusion Neuro: Normal gait, no weakness Psych: Normal mood, full affect   Assessment & Plan:   Dyspnea on exertion: Suspect multifactoral related to habitus, deconditioning with decreased exercise tolerance over time.  However, certainly do worry about hepatopulmonary syndrome given evidence of shunt on TEE with bubble as well as development of portal pulmonary hypertension given RV dysfunction demonstrated on serial echocardiograms.  Ambulated today with no desaturations oxygen saturation of 99-100.  Notably heart rate did not increase significantly. PFTs for further evaluation.  Presumed pulmonary hypertension: Based on RV dysfunction on serial TTE.  Worsened over time.  Multifactorial suspect related to group 2 disease with diastolic dysfunction and CAD as well as report of LA dilation in the past.  In addition, concern for Group 1 disease due to  portal pulmonary hypertension in setting of cirrhosis.  I cannot readily viewed his CardioMEMS data.  This is all out of the [facility].  Prior heart catheterization reassuring but is hard to tell far away this was in the past.  I think a repeat right catheterization would be helpful.  Did discuss with cardiology team at [facility] via phone, they are going to review CardioMEMS data as he has not transmit it in some time before making decision on right heart catheterization.  Syncope/presyncope: Concerning for reduced cardiac output/cardiac index in the setting of concern for pulmonary hypertension.  Notably, with ambulation as heart rate and not significant increase, max of 75 which begs question of chronotropic insufficiency leading to depressed cardiac output.  Right heart catheterization as discussed above.  Hepatopulmonary syndrome: With evidence of pulmonary shunt on TTE with bubble.  No desaturations at home or in the healthcare setting.  Ambulate today with no desaturation, oxygen saturation 99-100% with exertion.  Return in about 6 weeks (around 03/19/2024) for f/u Dr. Annella, after PFT.   Donnice JONELLE Annella, MD 02/06/2024   This appointment required 62 minutes of patient care (this includes precharting, chart review, review of results, face-to-face care, etc.).     [1]  Allergies Allergen Reactions   Metformin  And Related Diarrhea  [2]  Social History Tobacco Use  Smoking Status Never  Smokeless Tobacco Never

## 2024-02-06 NOTE — Patient Instructions (Signed)
 Nice to meet you  For further evaluation recommend pulmonary function test, you could schedule this when you check out  I also recommend repeat right heart catheterization.  It is hard for me to view the CardioMEMS reports.  I sent a message to your cardiology team at [facility] and here at [facility]  To help us  navigate this.  But I think a right heart catheterization would be helpful.  Return to clinic in 6 weeks or sooner as needed with Dr. Annella, after PFTs have been performed.

## 2024-02-08 ENCOUNTER — Other Ambulatory Visit: Payer: Self-pay | Admitting: Family Medicine

## 2024-02-09 ENCOUNTER — Ambulatory Visit: Admitting: Physician Assistant

## 2024-02-09 NOTE — Telephone Encounter (Signed)
 Pls associate dx code to rx.  Name of Medication:  Oxycodone  Name of Pharmacy:  [facility] Last Fill or Written Date and Quantity:  01/22/24, #30 Last Office Visit and Type:  01/02/24, 4 mo DM f/u Next Office Visit and Type:  04/03/24, 3 mo DM f/u Last Controlled Substance Agreement Date:  04/03/15 Last UDS:  04/03/15

## 2024-02-09 NOTE — Telephone Encounter (Signed)
 ER

## 2024-02-14 ENCOUNTER — Other Ambulatory Visit: Payer: Self-pay | Admitting: Family Medicine

## 2024-02-14 DIAGNOSIS — K219 Gastro-esophageal reflux disease without esophagitis: Secondary | ICD-10-CM

## 2024-02-20 ENCOUNTER — Other Ambulatory Visit: Payer: Self-pay | Admitting: Physician Assistant

## 2024-02-20 ENCOUNTER — Other Ambulatory Visit: Payer: Self-pay | Admitting: Family Medicine

## 2024-02-20 DIAGNOSIS — E1143 Type 2 diabetes mellitus with diabetic autonomic (poly)neuropathy: Secondary | ICD-10-CM

## 2024-02-20 DIAGNOSIS — K219 Gastro-esophageal reflux disease without esophagitis: Secondary | ICD-10-CM

## 2024-02-21 ENCOUNTER — Other Ambulatory Visit: Payer: Self-pay | Admitting: Family Medicine

## 2024-02-21 NOTE — Telephone Encounter (Signed)
 ERx

## 2024-02-26 MED ORDER — PANTOPRAZOLE SODIUM 40 MG PO TBEC: 40.0000 mg | DELAYED_RELEASE_TABLET | Freq: Two times a day (BID) | ORAL | 3 refills | Status: AC

## 2024-02-26 NOTE — Telephone Encounter (Signed)
 Denied dexilant . Refilled pantoprazole 

## 2024-02-28 ENCOUNTER — Other Ambulatory Visit: Payer: Self-pay

## 2024-02-28 ENCOUNTER — Other Ambulatory Visit: Payer: Self-pay | Admitting: Family Medicine

## 2024-02-28 DIAGNOSIS — K219 Gastro-esophageal reflux disease without esophagitis: Secondary | ICD-10-CM

## 2024-02-28 DIAGNOSIS — K7469 Other cirrhosis of liver: Secondary | ICD-10-CM

## 2024-03-04 ENCOUNTER — Other Ambulatory Visit: Payer: Self-pay | Admitting: Internal Medicine

## 2024-03-04 ENCOUNTER — Other Ambulatory Visit: Payer: Self-pay | Admitting: Family Medicine

## 2024-03-05 ENCOUNTER — Other Ambulatory Visit: Payer: Self-pay

## 2024-03-05 NOTE — Telephone Encounter (Signed)
 Requesting: Oxycodone   Contract: No UDS:  Last Visit: 01/02/2024 Next Visit: 04/03/2024 Last Refill: 02/21/24  Please Advise

## 2024-03-06 ENCOUNTER — Other Ambulatory Visit (HOSPITAL_COMMUNITY): Payer: Self-pay

## 2024-03-06 ENCOUNTER — Ambulatory Visit
Admission: RE | Admit: 2024-03-06 | Discharge: 2024-03-06 | Disposition: A | Discharge status: Home / Self Care | Payer: Medicare (Managed Care) | Source: Ambulatory Visit | Attending: Internal Medicine | Admitting: Internal Medicine

## 2024-03-06 ENCOUNTER — Telehealth: Payer: Self-pay

## 2024-03-06 DIAGNOSIS — K7469 Other cirrhosis of liver: Secondary | ICD-10-CM | POA: Insufficient documentation

## 2024-03-06 DIAGNOSIS — K7581 Nonalcoholic steatohepatitis (NASH): Secondary | ICD-10-CM | POA: Diagnosis present

## 2024-03-06 MED ORDER — GADOBUTROL 1 MMOL/ML IV SOLN
10.0000 mL | Freq: Once | INTRAVENOUS | Status: AC | PRN
  Administered 2024-03-06: 10 mL via INTRAVENOUS

## 2024-03-06 NOTE — Telephone Encounter (Signed)
 Pharmacy Patient Advocate Encounter   Received notification from [facility] that prior authorization for Oxycodone  Hcl 5 tabs is required/requested.   Insurance verification completed.   The patient is insured through [facility].   Per test claim: PA required; PA submitted to above mentioned insurance via Latent Key/confirmation #/EOC [id number] Status is pending

## 2024-03-06 NOTE — Telephone Encounter (Signed)
 ERx

## 2024-03-07 ENCOUNTER — Ambulatory Visit: Payer: Self-pay | Admitting: Internal Medicine

## 2024-03-07 ENCOUNTER — Other Ambulatory Visit (HOSPITAL_COMMUNITY): Payer: Self-pay

## 2024-03-07 NOTE — Telephone Encounter (Signed)
 Pharmacy Patient Advocate Encounter  Received notification from [facility] that Prior Authorization for Oxycodone  Hcl 5 tabs has been APPROVED from 03/06/24 to 02/20/25. Ran test claim, Copay is $2.55. This test claim was processed through [facility]- copay amounts may vary at other pharmacies due to pharmacy/plan contracts, or as the patient moves through the different stages of their insurance plan.   PA #/Case ID/Reference #: # [id number]

## 2024-03-07 NOTE — Telephone Encounter (Signed)
 My chart message sent to pt with determination

## 2024-03-19 ENCOUNTER — Other Ambulatory Visit: Payer: Self-pay | Admitting: Family Medicine

## 2024-03-19 NOTE — Telephone Encounter (Signed)
 Requesting: Oxycodone   Contract: No UDS:  Last Visit: 01/02/2024 Next Visit: 04/03/2024 Last Refill: 03/06/2024
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
## 2024-03-20 ENCOUNTER — Ambulatory Visit: Payer: Medicare (Managed Care) | Admitting: Physician Assistant

## 2024-03-20 ENCOUNTER — Ambulatory Visit: Payer: Self-pay | Admitting: Physician Assistant

## 2024-03-20 ENCOUNTER — Other Ambulatory Visit: Payer: Medicare (Managed Care)

## 2024-03-20 ENCOUNTER — Encounter: Payer: Self-pay | Admitting: Physician Assistant

## 2024-03-20 VITALS — BP 126/78 | HR 60 | Ht 69.0 in | Wt 263.0 lb

## 2024-03-20 DIAGNOSIS — K7682 Hepatic encephalopathy: Secondary | ICD-10-CM

## 2024-03-20 DIAGNOSIS — E611 Iron deficiency: Secondary | ICD-10-CM

## 2024-03-20 DIAGNOSIS — K7681 Hepatopulmonary syndrome: Secondary | ICD-10-CM

## 2024-03-20 DIAGNOSIS — K7581 Nonalcoholic steatohepatitis (NASH): Secondary | ICD-10-CM | POA: Diagnosis not present

## 2024-03-20 DIAGNOSIS — K3184 Gastroparesis: Secondary | ICD-10-CM

## 2024-03-20 DIAGNOSIS — K219 Gastro-esophageal reflux disease without esophagitis: Secondary | ICD-10-CM | POA: Diagnosis not present

## 2024-03-20 DIAGNOSIS — E1143 Type 2 diabetes mellitus with diabetic autonomic (poly)neuropathy: Secondary | ICD-10-CM

## 2024-03-20 DIAGNOSIS — R188 Other ascites: Secondary | ICD-10-CM

## 2024-03-20 DIAGNOSIS — K766 Portal hypertension: Secondary | ICD-10-CM

## 2024-03-20 DIAGNOSIS — K7469 Other cirrhosis of liver: Secondary | ICD-10-CM

## 2024-03-20 DIAGNOSIS — Z860101 Personal history of adenomatous and serrated colon polyps: Secondary | ICD-10-CM

## 2024-03-20 LAB — CBC WITH DIFFERENTIAL/PLATELET
Basophils Absolute: 0 10*3/uL (ref 0.0–0.1)
Basophils Relative: 0.5 % (ref 0.0–3.0)
Eosinophils Absolute: 0.1 10*3/uL (ref 0.0–0.7)
Eosinophils Relative: 1.9 % (ref 0.0–5.0)
HCT: 47.3 % (ref 39.0–52.0)
Hemoglobin: 16.3 g/dL (ref 13.0–17.0)
Lymphocytes Relative: 14.4 % (ref 12.0–46.0)
Lymphs Abs: 0.8 10*3/uL (ref 0.7–4.0)
MCHC: 34.5 g/dL (ref 30.0–36.0)
MCV: 85.5 fl (ref 78.0–100.0)
Monocytes Absolute: 0.6 10*3/uL (ref 0.1–1.0)
Monocytes Relative: 9.8 % (ref 3.0–12.0)
Neutro Abs: 4.3 10*3/uL (ref 1.4–7.7)
Neutrophils Relative %: 73.4 % (ref 43.0–77.0)
Platelets: 156 10*3/uL (ref 150.0–400.0)
RBC: 5.54 Mil/uL (ref 4.22–5.81)
RDW: 13.1 % (ref 11.5–15.5)
WBC: 5.8 10*3/uL (ref 4.0–10.5)

## 2024-03-20 LAB — COMPREHENSIVE METABOLIC PANEL WITH GFR
ALT: 46 U/L (ref 3–53)
AST: 56 U/L — ABNORMAL HIGH (ref 5–37)
Albumin: 4.3 g/dL (ref 3.5–5.2)
Alkaline Phosphatase: 222 U/L — ABNORMAL HIGH (ref 39–117)
BUN: 16 mg/dL (ref 6–23)
CO2: 29 meq/L (ref 19–32)
Calcium: 9.4 mg/dL (ref 8.4–10.5)
Chloride: 100 meq/L (ref 96–112)
Creatinine, Ser: 1 mg/dL (ref 0.40–1.50)
GFR: 77.03 mL/min
Glucose, Bld: 187 mg/dL — ABNORMAL HIGH (ref 70–99)
Potassium: 3.6 meq/L (ref 3.5–5.1)
Sodium: 138 meq/L (ref 135–145)
Total Bilirubin: 1.6 mg/dL — ABNORMAL HIGH (ref 0.2–1.2)
Total Protein: 6.8 g/dL (ref 6.0–8.3)

## 2024-03-20 LAB — PROTIME-INR
INR: 1.1 ratio — ABNORMAL HIGH (ref 0.8–1.0)
Prothrombin Time: 12.7 s (ref 9.6–13.1)

## 2024-03-20 LAB — IRON,TIBC AND FERRITIN PANEL
%SAT: 33 % (ref 20–48)
Ferritin: 110 ng/mL (ref 24–380)
Iron: 99 ug/dL (ref 50–180)
TIBC: 304 ug/dL (ref 250–425)

## 2024-03-20 LAB — AMMONIA: Ammonia: 42 umol/L — ABNORMAL HIGH (ref 11–35)

## 2024-03-20 NOTE — Patient Instructions (Signed)
 Your provider has requested that you go to the basement level for lab work before leaving today. Press B on the elevator. The lab is located at the first door on the left as you exit the elevator.  Increase Lactulose  to 3 times a day.  Please follow up in 3 months  _______________________________________________________  If your blood pressure at your visit was 140/90 or greater, please contact your primary care physician to follow up on this.  _______________________________________________________  If you are age 80 or older, your body mass index should be between 23-30. Your Body mass index is 38.84 kg/m. If this is out of the aforementioned range listed, please consider follow up with your Primary Care Provider.  If you are age 26 or younger, your body mass index should be between 19-25. Your Body mass index is 38.84 kg/m. If this is out of the aformentioned range listed, please consider follow up with your Primary Care Provider.   ________________________________________________________  The  GI providers would like to encourage you to use MYCHART to communicate with providers for non-urgent requests or questions.  Due to long hold times on the telephone, sending your provider a message by [facility] may be a faster and more efficient way to get a response.  Please allow 48 business hours for a response.  Please remember that this is for non-urgent requests.  _______________________________________________________  [facility] is using a team-based approach to care.  Your team is made up of your doctor and two to three APPS. Our APPS (Nurse Practitioners and Physician Assistants) work with your physician to ensure care continuity for you. They are fully qualified to address your health concerns and develop a treatment plan. They communicate directly with your gastroenterologist to care for you. Seeing the Advanced Practice Practitioners on your physician's team can help  you by facilitating care more promptly, often allowing for earlier appointments, access to diagnostic testing, procedures, and other specialty referrals.

## 2024-03-20 NOTE — Telephone Encounter (Signed)
 ERx

## 2024-03-26 ENCOUNTER — Other Ambulatory Visit: Payer: Self-pay | Admitting: Family Medicine

## 2024-03-26 ENCOUNTER — Ambulatory Visit: Admitting: Pulmonary Disease

## 2024-03-26 ENCOUNTER — Other Ambulatory Visit: Payer: Self-pay | Admitting: Physician Assistant

## 2024-03-26 ENCOUNTER — Encounter (HOSPITAL_BASED_OUTPATIENT_CLINIC_OR_DEPARTMENT_OTHER)

## 2024-03-26 DIAGNOSIS — I5032 Chronic diastolic (congestive) heart failure: Secondary | ICD-10-CM

## 2024-03-26 DIAGNOSIS — M1A00X0 Idiopathic chronic gout, unspecified site, without tophus (tophi): Secondary | ICD-10-CM

## 2024-03-26 DIAGNOSIS — E1143 Type 2 diabetes mellitus with diabetic autonomic (poly)neuropathy: Secondary | ICD-10-CM

## 2024-03-26 DIAGNOSIS — K7681 Hepatopulmonary syndrome: Secondary | ICD-10-CM

## 2024-03-26 DIAGNOSIS — G8929 Other chronic pain: Secondary | ICD-10-CM

## 2024-03-26 DIAGNOSIS — G4733 Obstructive sleep apnea (adult) (pediatric): Secondary | ICD-10-CM

## 2024-03-26 DIAGNOSIS — I519 Heart disease, unspecified: Secondary | ICD-10-CM

## 2024-03-26 DIAGNOSIS — I2510 Atherosclerotic heart disease of native coronary artery without angina pectoris: Secondary | ICD-10-CM

## 2024-03-27 NOTE — Telephone Encounter (Signed)
 ERx Spoke with patient at wife's appt today -he requests new referral to [facility] cardiology as [facility] cardiology no longer taking his insurance.  Dr Ritchie did [facility] 04/2023.

## 2024-03-28 ENCOUNTER — Ambulatory Visit: Payer: Medicare (Managed Care) | Admitting: Pulmonary Disease

## 2024-03-28 ENCOUNTER — Ambulatory Visit (HOSPITAL_BASED_OUTPATIENT_CLINIC_OR_DEPARTMENT_OTHER): Payer: Medicare (Managed Care)

## 2024-03-28 ENCOUNTER — Encounter: Payer: Self-pay | Admitting: Pulmonary Disease

## 2024-03-28 VITALS — BP 112/64 | HR 75 | Temp 97.9°F | Ht 70.0 in | Wt 262.6 lb

## 2024-03-28 DIAGNOSIS — R55 Syncope and collapse: Secondary | ICD-10-CM

## 2024-03-28 DIAGNOSIS — R0609 Other forms of dyspnea: Secondary | ICD-10-CM

## 2024-03-28 DIAGNOSIS — K7681 Hepatopulmonary syndrome: Secondary | ICD-10-CM

## 2024-03-28 LAB — PULMONARY FUNCTION TEST
DL/VA % pred: 126 %
DL/VA: 5.14 ml/min/mmHg/L
DLCO cor % pred: 89 %
DLCO cor: 23.45 ml/min/mmHg
DLCO unc % pred: 93 %
DLCO unc: 24.51 ml/min/mmHg
FEF 25-75 Post: 3.94 L/s
FEF 25-75 Pre: 3.97 L/s
FEF2575-%Change-Post: 0 %
FEF2575-%Pred-Post: 156 %
FEF2575-%Pred-Pre: 157 %
FEV1-%Change-Post: 7 %
FEV1-%Pred-Post: 85 %
FEV1-%Pred-Pre: 79 %
FEV1-Post: 2.81 L
FEV1-Pre: 2.62 L
FEV1FVC-%Change-Post: 2 %
FEV1FVC-%Pred-Pre: 121 %
FEV6-%Change-Post: 4 %
FEV6-%Pred-Post: 72 %
FEV6-%Pred-Pre: 69 %
FEV6-Post: 3.07 L
FEV6-Pre: 2.93 L
FEV6FVC-%Pred-Post: 105 %
FEV6FVC-%Pred-Pre: 105 %
FVC-%Change-Post: 4 %
FVC-%Pred-Post: 68 %
FVC-%Pred-Pre: 65 %
FVC-Post: 3.07 L
FVC-Pre: 2.93 L
Post FEV1/FVC ratio: 92 %
Post FEV6/FVC ratio: 100 %
Pre FEV1/FVC ratio: 89 %
Pre FEV6/FVC Ratio: 100 %
RV % pred: 99 %
RV: 2.41 L
TLC % pred: 79 %
TLC: 5.6 L

## 2024-03-28 NOTE — Progress Notes (Signed)
 Full PFT performed today.

## 2024-03-28 NOTE — Progress Notes (Unsigned)
 " Cardiology Office Note   Date:  03/29/2024  ID:  Chad [name], DOB 06/12/1955, MRN [id number] PCP: Chad Baller, MD  [facility] Providers Cardiologist:  Chad Poser, MD     History of Present Illness Chad [name] is a 69 y.o. male PMH cirrhosis, HTN, HLD, CAD, HFpEF, morbid obesity who presents to establish care.  Patient reports chest pain and dyspnea as well as orthopnea which have been chronic for many years now.  He previously followed at [facility] and has had 2 caths within the last year, both showing patent LAD stent with otherwise nonobstructive and/or nonintervenable disease.  He notes that his known dry weight is 250 pounds; he is 255 pounds today on his home scale.  He has been dealing with this for a long time and says that he can also tell that he has a little bit of fluid on board because he typically holds fluid in his stomach and hands.  He notes that he has been trialed on Imdur  before and cannot tolerate due to hypotension and bradycardia.  Similar with sublingual nitro.  Last LDL 31 07/2023.  Relevant CVD History -Cath 11/2023 patent LAD stent, moderate disease proximal to previous mid LAD stent (notably underexpanded) with a jailed first diagonal, mild nonobstructive disease in the other territories - TTE with bubble 10/2023 normal LV function, mildly reduced RV function, late bubbles suggestive of intrapulmonary shunt - TTE 10/2023 normal LV function, mildly reduced RV function, no significant valve disease - Cath 04/2023 moderate 40% disease proximal to prior stent, patent LAD stent.  Microvascular dysfunction noted due to elevated INR. -PCI LAD 12/2016   ROS: Pt denies any jaw pain, arm pain, palpitations, syncope, presyncope, or LE edema.  Studies Reviewed I have independently reviewed the patient's ECG, previous cardiac testing, previous cardiac treatments, previous medical records, previous blood work.  Physical Exam VS:  BP 116/62 (BP Location: Right  Arm, Patient Position: Sitting, Cuff Size: Large)   Pulse 70   Ht 5' 9 (1.753 m)   Wt 263 lb (119.3 kg)   SpO2 96%   BMI 38.84 kg/m        Wt Readings from Last 3 Encounters:  03/29/24 263 lb (119.3 kg)  03/28/24 262 lb 9.6 oz (119.1 kg)  03/20/24 263 lb (119.3 kg)    GEN: No acute distress. NECK: No JVD; No carotid bruits. CARDIAC: RRR, no murmurs, rubs, gallops. RESPIRATORY:  Clear to auscultation. EXTREMITIES:  Warm and well-perfused. No edema.  ASSESSMENT AND PLAN CAD status post PCI LAD 2018 Chronic angina HLD Patient presents to establish care.  He has chronic angina which has been notoriously difficult to control.  He had 2 heart catheterizations in 2025 (04/2023 and 11/2023), both of which showed patent LAD stent and no intervenable disease.  His other coronary disease is mild to moderate.  He cannot tolerate Imdur  or nitrates due to bradycardia/hypotension and syncope.  Given his underlying cirrhosis, we cannot do Ranexa.  Given these limitations, we have limited options and need to do our best to control determinants of myocardial oxygen demand.  LDL well-controlled at 31 04/7972.  Plan: - Continue ASA 81 mg daily - Continue Lipitor 20 mg daily - Continue metoprolol  XL 50 mg daily; can uptitrate this as heart rate allows, though he does have an underlying trifascicular block - As above, cannot use nitrates (sublingual or long-acting) due to hypotension and bradycardia leading to syncope in the past - Unable to use Ranexa given cirrhosis -  Will increase diuresis to help reduce wall stress as below - Can always consider a repeat coronary angiogram or stress PET should the character of his chest discomfort change; the symptoms he is describing in the office today are similar to what he has been experiencing for several years now  HFpEF Patient has a long history of HFpEF and comorbid cirrhosis.  He notes that he typically holds fluid in his gut and his hands.  Known dry  weight is 250 pounds, he was 255 pounds today on his home scale.  He does have orthopnea. NYHA III-IV. On exam, he does not have clearly elevated JVP, Rales, or lower extremity edema.  Very challenging situation.  He is already on optimal HFpEF therapies.  He spends most of his time in bed.  I think it will be difficult to make him feel completely better, but we can at least optimize his volume status.  Plan: - Plan right heart cath to objectively measure filling pressures in order to guide diuresis given difficult volume exam - Increase Bumex  to 2 mg twice daily to target known dry weight of 250 pounds while at home; he is comfortable doing this since he is been dealing this for many years now.  Once he is approaching his dry weight, we can go back to 2 mg every morning and 1 mg every afternoon - Continue Farxiga  10 mg daily - Continue spironolactone  25 mg daily - Echocardiogram  Morbid obesity Complicating all aspects of care.  He has already lost over 100 pounds.  Continue with GLP-1 therapies as able.  Trifascicular block No recent syncopal episodes.  Will need to keep a close eye on this, though he is not an ideal candidate for PPM.     Informed Consent   The risks, including but not limited to, [bleeding or vascular complications (1 in 500), pneumothorax (1 in 1600), arrhythmia (1 in 1000) and death (1 in 5000)], benefits (diagnostic support and/or management of heart failure, pulmonary hypertension) and alternatives of a right heart catheterization were discussed in detail with Chad [name] and he is willing to proceed.     Dispo: RTC 1 month or sooner as needed  I spent 67 minutes today reviewing prior complex medical history, prior complex cardiovascular studies and interventions, counseling the patient on his severe cardiac comorbidities and discussing expectation management.  I also spent time counseling him on limited available options including the natural history of his severe,  chronic angina and severe chronic heart failure with preserved ejection fraction with comorbid cirrhosis.  I spent time discussing strategies to optimize his volume status including increasing diuresis over the next few days.  Finally, I spent time doing a thorough physical exam on the patient, assessing volume status, and I spent time counseling and consenting for an upcoming right heart catheterization procedure.  Signed, Chad Poser, MD  "

## 2024-03-28 NOTE — Patient Instructions (Signed)
 Nice to see you again  Your pulmonary function test are consistent with possible asthma.  To treat this aggressively take Trelegy 1 puff once a day.  Use the samples.  If you find it beneficial to me a message and I will prescribe it.  I chose a lower dose steroid to try to prevent recurrence of the thrush or Candida infection in the mouth and in the esophagus as well.  Return to clinic in 3 months or sooner as needed with Dr. Annella

## 2024-03-28 NOTE — Progress Notes (Signed)
 "  @Patient  ID: Chad [name], male    DOB: 1955-09-22, 69 y.o.   MRN: [id number]  Chief Complaint  Patient presents with   Shortness of Breath    Pt states since LOV breathing has been the same SOB occurs w/ any activity Dry cough    Referring provider: Annella Donnice SAUNDERS, MD  HPI:   69 y.o. man with history of diastolic dysfunction, CHF as well as cirrhosis whom we are seeing for evaluation of dyspnea on exertion.  Most recent GI note reviewed.  Most recent PCP note reviewed.  Most recent cardiology note reviewed.  Returns for follow-up for PFTs.  Reviewed refill.  Air-trapping present.  Small airway disease favored.  If any pulmonary contribution.  His weight and fluid is up today.  Is more short of breath pretty consistently feels more short of breath when his fluid builds up.  He has plan for taking extra Lasix  and usually it helps get the fluid down at [place].  He is establishing with a new cardiologist tomorrow.  His [facility] team is no longer an option given they are out of network now for his insurance.  HPI initial visit: Patient is a short of breath now for several years.  Least 5 years.  Gradually has worsened.  Certainly worse with exertion.  Can occur at rest as well.  Almost episodic.  Comes and goes.  Associated symptoms increased lightheadedness, seeing black, presyncope and syncope.  No time of day when things are better or worse.  No position to make things better or worse.  No seasonal or environmental factors he can identify that make things better or worse.  He notes his oxygen saturation is always in the high 90s.  At home when he is walking around or moving as well as in doctors offices.  He states has been hospitalized in the past and placed on oxygen albeit temporarily.  He has a history of diastolic dysfunction, diastolic heart failure.  Difficult control volume status with CardioMEMS now in place.  Remote echocardiogram several reviewed.  Demonstrated right atrial  dilation.  More recent echocardiogram from 2025, x 2, demonstrate normal left atrial size, normal EF, normal valves, diastolic dysfunction, normal RA and RV size but now with RV dysfunction.  Estimated PASP/RVSP all reported as normal.  Echo with bubble study recently demonstrated concern for intrapulmonary shunt with late bubbles present.  In the past he had a stent placed in the coronary artery.  Recent left heart catheterization in 2025 showed 40% in stenosis but no other concerning findings.  Minimal coronary disease.  He had a right heart catheterization in the past, date not clear to me.  But did not demonstrate pulmonary hypertension with a mean PA pressure in the teens.  Most recent cross-sectional imaging CT angio of the chest 10/2023 demonstrates clear lungs bilaterally without significant parenchymal findings.  Questionaires / Pulmonary Flowsheets:   ACT:      No data to display          MMRC:     No data to display          Epworth:      No data to display          Tests:   FENO:  No results found for: NITRICOXIDE  PFT:     No data to display          WALK:     02/06/2024   10:50 AM 01/15/2019    3:13 PM  05/15/2017    3:22 PM  SIX MIN WALK  Supplimental Oxygen during Test? (L/min) Yes No   O2 Flow Rate 2 L/min    Type Continuous    2 Minute Oxygen Saturation %   95 %  2 Minute HR   --  4 Minute Oxygen Saturation %   97 %  6 Minute Oxygen Saturation %   98 %  6 Minute HR   77  Tech Comments:  c/o dizziness and sob.     Imaging: Personally reviewed meds per EMR and discussion in this note MR Abdomen W Wo Contrast Result Date: 03/06/2024 CLINICAL DATA:  HCC Screening. EXAM: MRI ABDOMEN WITHOUT AND WITH CONTRAST TECHNIQUE: Multiplanar multisequence MR imaging of the abdomen was performed both before and after the administration of intravenous contrast. CONTRAST:  10mL GADAVIST  GADOBUTROL  1 MMOL/ML IV SOLN COMPARISON:  CT scan abdomen and pelvis  from 12/10/2023 and MRI abdomen from 02/14/2023. FINDINGS: Lower chest: Unremarkable MR appearance to the lung bases. No pleural effusion. No pericardial effusion. Normal heart size. Hepatobiliary: The liver is moderately enlarged measuring up to 18.7 cm in length. There is diffuse surface nodularity and heterogeneous signal intensity, compatible with cirrhosis. There several, peripheral/subcapsular, arterial hyperenhancing foci (marked with electronic arrow sign on series 15), which are not seen on any other sequences. These are favored to represent small arterio-portal shunts. These can be characterized as LI-RADS 3 lesion. No suspicious liver lesion. No intrahepatic or extrahepatic bile duct dilatation. No choledocholithiasis. Status post cholecystectomy. Pancreas: Normal T1 hyperintense signal intensity of the pancreas. Redemonstration of a well-circumscribed 1.8 x 2.0 cm partially exophytic structure in the pancreatic tail which exhibits signal intensity similar to the spleen on all sequences and is favored to represent intrapancreatic splenule. No other focal pancreatic lesion. No peripancreatic fat stranding. Main pancreatic duct is nondilated. Spleen: Enlarged measuring upto 10.4 x 18.7 cm orthogonally on coronal plane. No focal mass. Adrenals/Urinary Tract: Unremarkable left adrenal gland. There is a stable 2.2 x 2.3 cm right adrenal myelolipoma. No hydroureteronephrosis. No suspicious renal mass. Stomach/Bowel: Visualized portions within the abdomen are unremarkable. No disproportionate dilation of bowel loops. Vascular/Lymphatic: No pathologically enlarged lymph nodes identified. No abdominal aortic aneurysm demonstrated. No ascites. Other:  None. Musculoskeletal: No suspicious bone lesions identified. IMPRESSION: 1. Cirrhotic liver with moderate hepatosplenomegaly. No ascites. 2. No suspicious liver lesion. There are several, peripheral/subcapsular arterial hyperenhancing foci with imaging  characteristics and probable diagnosis as described above. Attention on follow-up imaging is recommended. 3. Redemonstration of a well-circumscribed 1.8 x 2.0 cm partially exophytic structure in the pancreatic tail which exhibits signal intensity similar to the spleen on all sequences and is favored to represent intrapancreatic splenule. 4. Multiple other nonacute observations, as described above. Electronically Signed   By: Ree Molt M.D.   On: 03/06/2024 16:30    Lab Results: Personally reviewed and as per EMR and discussion in this note CBC    Component Value Date/Time   WBC 5.8 03/20/2024 1104   RBC 5.54 03/20/2024 1104   HGB 16.3 03/20/2024 1104   HCT 47.3 03/20/2024 1104   PLT 156.0 03/20/2024 1104   MCV 85.5 03/20/2024 1104   MCV 88.0 08/04/2010 0000   MCH 29.5 12/11/2023 0344   MCHC 34.5 03/20/2024 1104   RDW 13.1 03/20/2024 1104   LYMPHSABS 0.8 03/20/2024 1104   MONOABS 0.6 03/20/2024 1104   EOSABS 0.1 03/20/2024 1104   BASOSABS 0.0 03/20/2024 1104    BMET    Component Value  Date/Time   NA 138 03/20/2024 1104   NA 139 08/04/2010 0000   K 3.6 03/20/2024 1104   K 4.4 08/04/2010 0000   CL 100 03/20/2024 1104   CO2 29 03/20/2024 1104   GLUCOSE 187 (H) 03/20/2024 1104   BUN 16 03/20/2024 1104   CREATININE 1.00 03/20/2024 1104   CREATININE 1.11 10/26/2018 0909   CALCIUM  9.4 03/20/2024 1104   GFRNONAA >60 12/11/2023 0344   GFRAA >60 05/17/2018 1530    BNP    Component Value Date/Time   BNP 24.9 12/09/2023 1547    ProBNP    Component Value Date/Time   PROBNP 18.0 04/06/2018 1152    Specialty Problems       Pulmonary Problems   OSA (obstructive sleep apnea)   Has not tolerated CPAP      Cough   Asthma   Hypoxia    Allergies[1]  Immunization History  Administered Date(s) Administered   Fluad Quad(high Dose 65+) 11/25/2020, 12/21/2021   Fluad Trivalent(High Dose 65+) 11/08/2022   H1N1 01/30/2008   Hep A / Hep B 10/07/2015, 11/04/2015,  04/08/2016   INFLUENZA, HIGH DOSE SEASONAL PF 10/24/2023   Influenza Split 12/28/2010, 11/15/2011   Influenza, Seasonal, Injecte, Preservative Fre 11/27/2006, 11/26/2007, 11/25/2008, 11/24/2009   Influenza,inj,Quad PF,6+ Mos 11/15/2012, 11/04/2013, 10/23/2014, 10/27/2015, 11/08/2016, 10/26/2017, 10/15/2018, 11/13/2019   PFIZER(Purple Top)SARS-COV-2 Vaccination 04/04/2019, 04/25/2019, 01/24/2020   Pfizer Covid-19 Vaccine Bivalent Booster 47yrs & up 12/11/2020   Pneumococcal Conjugate-13 03/18/2020   Pneumococcal Polysaccharide-23 11/27/2006, 09/06/2011   Td 01/31/2011   Zoster Recombinant(Shingrix) 12/10/2018, 07/10/2019, 09/10/2019    Past Medical History:  Diagnosis Date   Abnormal drug screen    innaprop negative for hydrocodone  09/2013, inapprop negative for hydrocodone  and tramadol  02/2014; inappropr negative hydrocodone  03/2015   Acute diverticulitis 08/15/2014   Allergy    seasonal allergies   Anxiety    on meds   Arthritis    both hips and knees; got shots in each hip in August (01/25/2013)   Balanoposthitis 01/29/2015   Bone spur    L4 L5   Bulging lumbar disc    Central retinal vein occlusion with macular edema of left eye (HCC) 10/17/2013   L eye 01/2017  Bulakowski - referred to retinologist 01/2017  CRVO with macular edema L eye planned treatment with Ozurdex  (dexamehthasone intravitreal implant) by Dr Jarold 02/2017   Cirrhosis (HCC)    Coronary artery disease    COVID-19 10/29/2019   10/2019 - s/p mAb infusion treatment      COVID-19 virus infection 10/29/2019   10/2019 - s/p mAb infusion treatment    Diabetes mellitus without complication (HCC)    type 2- on meds   Diastolic CHF, chronic (HCC) 04/02/2012   Diverticulosis    Gastric bypass status for obesity 1985   Gastritis 08/31/2015   with focal intestinal metaplasia   GERD (gastroesophageal reflux disease)    severe, h/o gastritis and GI bleed, per pt normal EGD at [facility] 2008   Hepatic steatosis    History of  diabetes mellitus 1990s   with mild background retinopathy, resolved with weight loss   HLD (hyperlipidemia)    statin caused leg cramps   HTN (hypertension)    not on meds at this time (02/05/2020)   Hyperglycemia glucose over 300 in last 24 hrs 07/12/2016   Hyperplastic colon polyp 2008   Hypothyroid    on meds   Hypoxia 11/15/2023   Internal hemorrhoids    Morbid obesity (HCC)  Narrowing of lumbar spine    OSA (obstructive sleep apnea)    unable to use CPAP as of last try 2/2 h/o tracheostomy? weight loss 100lbs   Otomycosis of right ear 07/06/2011   Primary localized osteoarthritis of left knee 06/29/2016   PVC (premature ventricular contraction)    RBBB Infer axis   Right ear pain    s/p eval by ENT - thought TMJ referred pain and sent to oral surg for dental splint   Seasonal allergies    Sensorineural hearing loss, bilateral    no longer wears hearing aides   Splenomegaly    Thrombocytopenia 06/10/2015   Platelet count dropped to 73 post op day 2 after total knee    Tinnitus    due to sensorineural hearing loss R>L with ETD   Trifascicular block  RBBB/LPFB/1AVB     Tobacco History: Tobacco Use History[2] Counseling given: Not Answered   Continue to not smoke  Outpatient Encounter Medications as of 03/28/2024  Medication Sig   albuterol  (VENTOLIN  HFA) 108 (90 Base) MCG/ACT inhaler Inhale 2 puffs into the lungs every 6 (six) hours as needed for wheezing or shortness of breath.   allopurinol  (ZYLOPRIM ) 100 MG tablet Take 1 tablet (100 mg total) by mouth every other day.   amLODipine  (NORVASC ) 2.5 MG tablet Take 1 tablet (2.5 mg total) by mouth daily.   aspirin  EC 81 MG tablet Take 81 mg by mouth daily.   atorvastatin  (LIPITOR) 20 MG tablet Take 1 tablet (20 mg total) by mouth daily.   Blood Glucose Monitoring Suppl DEVI 1 each by Does not apply route as directed. Dispense based on patient and insurance preference. Use up to four times daily as directed. (FOR ICD-10  E10.9, E11.9).   bumetanide  (BUMEX ) 1 MG tablet Take 2 tablets (2 mg total) by mouth every morning AND 1 tablet (1 mg total) daily in the afternoon.   colchicine  0.6 MG tablet TAKE 1 TABLET (0.6 MG TOTAL) BY MOUTH DAILY AS NEEDED (GOUT FLARE).   dapagliflozin  propanediol (FARXIGA ) 10 MG TABS tablet Take 10 mg by mouth daily before breakfast.   escitalopram  (LEXAPRO ) 10 MG tablet Take 1 tablet (10 mg total) by mouth daily.   Ferrous Sulfate  27 MG TABS Take 1 tablet by mouth daily with breakfast.   fluticasone  (FLONASE ) 50 MCG/ACT nasal spray Place 2 sprays into both nostrils daily. (Patient taking differently: Place 2 sprays into both nostrils as needed.)   glipiZIDE  (GLUCOTROL ) 5 MG tablet Take 1 tablet (5 mg total) by mouth daily before breakfast.   Glucose Blood (BLOOD GLUCOSE TEST STRIPS) STRP 1 each by Does not apply route as directed. Dispense based on patient and insurance preference. Use up to four times daily as directed. (FOR ICD-10 E10.9, E11.9).   isosorbide  mononitrate (IMDUR ) 30 MG 24 hr tablet Take 1 tablet (30 mg total) by mouth daily.   lactulose  (CHRONULAC ) 10 GM/15ML solution Take 45 mLs (30 g total) by mouth 3 (three) times daily.   Lancet Device MISC 1 each by Does not apply route as directed. Dispense based on patient and insurance preference. Use up to four times daily as directed. (FOR ICD-10 E10.9, E11.9).   Lancets Misc. (ACCU-CHEK SOFTCLIX LANCET DEV) KIT Use up to four times daily as directed to check blood sugar.   Lancets MISC 1 each by Does not apply route as directed. Dispense based on patient and insurance preference. Use up to four times daily as directed. (FOR ICD-10 E10.9, E11.9).  levothyroxine  (SYNTHROID ) 137 MCG tablet Take 1 tablet (137 mcg total) by mouth daily before breakfast. TAKE ONE TAB BY MOUTH ONCE DAILY. TAKE ON AN EMPTY STOMACH WITH A GLASS OF WATER ATLEAST 30-60 MINUTES BEFORE BREAKFAST   magnesium  oxide (MAG-OX) 400 (240 Mg) MG tablet Take 400 mg by  mouth.   metoCLOPramide  (REGLAN ) 5 MG tablet TAKE ONE TABLET (5 MG TOTAL) BY MOUTH EVERY EIGHT HOURS AS NEEDED FOR NAUSEA.   metoprolol  succinate (TOPROL -XL) 25 MG 24 hr tablet Take 50 mg by mouth daily.   Multiple Vitamins-Minerals (ONE-A-DAY MENS 50+) TABS Take 1 tablet by mouth in the morning.   nitroGLYCERIN  (NITROSTAT ) 0.4 MG SL tablet Place 1 tablet (0.4 mg total) under the tongue every 5 (five) minutes as needed for chest pain.   oxyCODONE  (OXY IR/ROXICODONE ) 5 MG immediate release tablet Take 1 tablet (5 mg total) by mouth 2 (two) times daily as needed for severe pain (pain score 7-10).   pantoprazole  (PROTONIX ) 40 MG tablet Take 1 tablet (40 mg total) by mouth 2 (two) times daily.   potassium chloride  SA (KLOR-CON  M) 20 MEQ tablet Take 1 tablet (20 mEq total) by mouth daily. With bumex    Psyllium (METAMUCIL 4 IN 1 FIBER) 55.6 % POWD Take 1 Dose by mouth in the morning.   spironolactone  (ALDACTONE ) 25 MG tablet Take 25 mg by mouth daily.   sucralfate  (CARAFATE ) 1 GM/10ML suspension Take 10 mLs (1 g total) by mouth 4 (four) times daily -  with meals and at bedtime.   vitamin B-12 (CYANOCOBALAMIN ) 500 MCG tablet Take 1 tablet (500 mcg total) by mouth daily.   vitamin E  400 UNIT capsule Take 400 Units by mouth in the morning.   No facility-administered encounter medications on file as of 03/28/2024.     Review of Systems  Review of Systems  N/a  Physical Exam  BP 112/64   Pulse 75   Temp 97.9 F (36.6 C) (Oral)   Ht 5' 10 (1.778 m)   Wt 262 lb 9.6 oz (119.1 kg)   SpO2 95% Comment: on RA  BMI 37.68 kg/m   Wt Readings from Last 5 Encounters:  03/28/24 262 lb 9.6 oz (119.1 kg)  03/20/24 263 lb (119.3 kg)  03/06/24 259 lb (117.5 kg)  02/06/24 259 lb (117.5 kg)  01/09/24 255 lb (115.7 kg)    BMI Readings from Last 5 Encounters:  03/28/24 37.68 kg/m  03/20/24 38.84 kg/m  03/06/24 38.25 kg/m  02/06/24 38.25 kg/m  01/09/24 37.66 kg/m     Physical Exam General: In  chair, no distress Eyes: No icterus noted Neck: No JVP sitting upright Pulmonary: Clear, distant, normal work of breathing. Cardiovascular: Lower extremity edema noted   Assessment & Plan:   Dyspnea on exertion: Suspect multifactoral related to habitus, deconditioning with decreased exercise tolerance over time.  However, certainly do worry about hepatopulmonary syndrome given evidence of shunt on TEE with bubble as well as development of portal pulmonary hypertension given RV dysfunction demonstrated on serial echocardiograms.  Ambulated prior with no desaturation.  PFTs consistent with air trapping, likely small airways disease.  Presumed pulmonary hypertension: Based on RV dysfunction on serial TTE.  Worsened over time.  Multifactorial suspect related to group 2 disease with diastolic dysfunction and CAD as well as report of LA dilation in the past.  In addition, concern for Group 1 disease due to portopulmonary hypertension in setting of cirrhosis.  I cannot readily viewed his CardioMEMS data.  This is all  out of the [facility].  I think he would benefit from repeat right heart catheterization given his risk factors for pulmonary hypertension, group 2 disease with diastolic dysfunction as well.  I encouraged and discussed with his cardiologist, new visit tomorrow.  Syncope/presyncope: Concerning for reduced cardiac output/cardiac index in the setting of concern for pulmonary hypertension.  Notably, with ambulation as heart rate and not significant increase, max of 75 which begs question of chronotropic insufficiency leading to depressed cardiac output.  Right heart catheterization as discussed above.  Hepatopulmonary syndrome: With evidence of pulmonary shunt on TTE with bubble.  No desaturations at home or in the healthcare setting today.  Continue serial investigation.  Return in about 3 months (around 06/25/2024) for f/u Dr. Annella.   Donnice JONELLE Annella, MD 03/28/2024       [1]   Allergies Allergen Reactions   Metformin  And Related Diarrhea  [2]  Social History Tobacco Use  Smoking Status Never  Smokeless Tobacco Never   "

## 2024-03-28 NOTE — Patient Instructions (Signed)
 Full PFT performed today.

## 2024-03-29 ENCOUNTER — Ambulatory Visit: Payer: Medicare (Managed Care)

## 2024-03-29 VITALS — BP 116/62 | HR 70 | Ht 69.0 in | Wt 263.0 lb

## 2024-03-29 DIAGNOSIS — I453 Trifascicular block: Secondary | ICD-10-CM

## 2024-03-29 DIAGNOSIS — Z9861 Coronary angioplasty status: Secondary | ICD-10-CM

## 2024-03-29 DIAGNOSIS — I2510 Atherosclerotic heart disease of native coronary artery without angina pectoris: Secondary | ICD-10-CM

## 2024-03-29 DIAGNOSIS — I209 Angina pectoris, unspecified: Secondary | ICD-10-CM

## 2024-03-29 DIAGNOSIS — E782 Mixed hyperlipidemia: Secondary | ICD-10-CM

## 2024-03-29 DIAGNOSIS — I5033 Acute on chronic diastolic (congestive) heart failure: Secondary | ICD-10-CM

## 2024-03-29 MED ORDER — BUMETANIDE 1 MG PO TABS: ORAL_TABLET | ORAL | 2 refills | Status: AC

## 2024-03-29 NOTE — Patient Instructions (Signed)
 Medication Instructions:  Your physician recommends the following medication changes.  STOP TAKING: Nitroglycerin  tabs 0.4 (NITROSTAT )/Nitrate containing medications due to sensitivity  INCREASE: Bemetanide (BUMEX )  1mg  tablet - continue current medication dose; however, add an extra dose in the afternoons until you reach your dry weight (155 pounds) *If you need a refill on your cardiac medications before your next appointment, please call your pharmacy*  Lab Work: No labs ordered today  If you have labs (blood work) drawn today and your tests are completely normal, you will receive your results only by: MyChart Message (if you have MyChart) OR A paper copy in the mail If you have any lab test that is abnormal or we need to change your treatment, we will call you to review the results.  Testing/Procedures:  Your physician has requested that you have an echocardiogram. Echocardiography is a painless test that uses sound waves to create images of your heart. It provides your doctor with information about the size and shape of your heart and how well your hearts chambers and valves are working.   You may receive an ultrasound enhancing agent through an IV if needed to better visualize your heart during the echo. This procedure takes approximately one hour.  There are no restrictions for this procedure.  This will take place at [address], [place] [address]   Right Heart Cath:   [facility] A DEPT OF [facility] AT [facility] [address] [place] [address] Dept: [phone] Loc: [phone]  Keiden E Kishi  03/29/2024  You are scheduled for a RIGHT Cardiac Catheterization on Monday, February 9 with Dr. Deatrice Cage.  1. Please arrive at the [facility]ascular Center Entrance of [facility], [address], [place] [address] at 10:30 AM (This is 1 hour(s) prior to  your procedure time).  Proceed to the Check-In Desk directly inside the entrance.  Procedure Parking: Use the entrance off of the [address] side of the hospital. Turn right upon entering and follow the driveway to parking that is directly in front of the Heart & Vascular Center. There is no valet parking available at this entrance, however there is an awning directly in front of the Heart & Vascular Center for drop off/ pick up for patients.  Special note: Every effort is made to have your procedure done on time. Please understand that emergencies sometimes delay scheduled procedures.  2. Diet: Nothing to eat after midnight.   3. Hydration: On February 9, you may drink approved liquids (see below) until 2 hours (8:30am) before the procedure with 8 oz of water as your last intake.   List of approved liquids water, clear juice, clear tea, black coffee, fruit juices, non-citric and without pulp, carbonated beverages, Gatorade, Kool -Aid, plain Jello-O and plain ice popsicles.  4. Labs: CMP AND CBC drawn on 03/20/2024  (GFR 77.03)  5. Medication instructions in preparation for your procedure:   Contrast Allergy: No  HOLD Diuretics morning of procedure:  Spironolactone  and Bumex   On the morning of your procedure, take your Aspirin  81 mg and any morning medicines NOT listed above.  You may use sips of water.  6. Plan to go home the same day, you will only stay overnight if medically necessary. 7. Bring a current list of your medications and current insurance cards. 8. You MUST have a responsible person to drive you home. 9. Someone MUST be  with you the first 24 hours after you arrive home or your discharge will be delayed. 10. Please wear clothes that are easy to get on and off and wear slip-on shoes.  Thank you for allowing us  to care for you!   -- [facility]     Follow-Up: At [facility], you and your health needs are our priority.  As  part of our continuing mission to provide you with exceptional heart care, our providers are all part of one team.  This team includes your primary Cardiologist (physician) and Advanced Practice Providers or APPs (Physician Assistants and Nurse Practitioners) who all work together to provide you with the care you need, when you need it.  Your next appointment:  4 week(s)  Provider:  Caron Poser, MD    We recommend signing up for the patient portal called MyChart.  Sign up information is provided on this After Visit Summary.  MyChart is used to connect with patients for Virtual Visits (Telemedicine).  Patients are able to view lab/test results, encounter notes, upcoming appointments, etc.  Non-urgent messages can be sent to your provider as well.   To learn more about what you can do with MyChart, go to [email or web address].

## 2024-04-01 ENCOUNTER — Ambulatory Visit
Admission: RE | Admit: 2024-04-01 | Payer: Medicare (Managed Care) | Source: Home / Self Care | Admitting: Cardiovascular Disease

## 2024-04-01 ENCOUNTER — Encounter: Admission: RE | Payer: Self-pay | Source: Home / Self Care

## 2024-04-01 DIAGNOSIS — R0609 Other forms of dyspnea: Secondary | ICD-10-CM

## 2024-04-03 ENCOUNTER — Ambulatory Visit: Admitting: Family Medicine

## 2024-04-10 ENCOUNTER — Ambulatory Visit: Payer: Medicare (Managed Care)

## 2024-04-26 ENCOUNTER — Ambulatory Visit: Payer: Medicare (Managed Care)

## 2024-05-28 ENCOUNTER — Ambulatory Visit

## 2024-05-31 ENCOUNTER — Ambulatory Visit: Payer: Self-pay

## 2024-06-07 ENCOUNTER — Ambulatory Visit

## 2024-06-25 ENCOUNTER — Ambulatory Visit: Payer: Medicare (Managed Care) | Admitting: Pulmonary Disease
